# Patient Record
Sex: Male | Born: 1955 | Race: White | Hispanic: No | Marital: Single | Smoking: Never smoker
Health system: Southern US, Community
[De-identification: ages and names within clinical notes are randomized; demographics above are authoritative.]

## PROBLEM LIST (undated history)

## (undated) DIAGNOSIS — F419 Anxiety disorder, unspecified: Secondary | ICD-10-CM

## (undated) DIAGNOSIS — R251 Tremor, unspecified: Secondary | ICD-10-CM

## (undated) DIAGNOSIS — E669 Obesity, unspecified: Secondary | ICD-10-CM

## (undated) DIAGNOSIS — N3281 Overactive bladder: Secondary | ICD-10-CM

## (undated) DIAGNOSIS — M1990 Unspecified osteoarthritis, unspecified site: Secondary | ICD-10-CM

## (undated) DIAGNOSIS — F064 Anxiety disorder due to known physiological condition: Secondary | ICD-10-CM

## (undated) DIAGNOSIS — J449 Chronic obstructive pulmonary disease, unspecified: Secondary | ICD-10-CM

## (undated) DIAGNOSIS — H9190 Unspecified hearing loss, unspecified ear: Secondary | ICD-10-CM

## (undated) DIAGNOSIS — E119 Type 2 diabetes mellitus without complications: Secondary | ICD-10-CM

## (undated) DIAGNOSIS — I10 Essential (primary) hypertension: Secondary | ICD-10-CM

## (undated) DIAGNOSIS — F329 Major depressive disorder, single episode, unspecified: Secondary | ICD-10-CM

## (undated) DIAGNOSIS — F32A Depression, unspecified: Secondary | ICD-10-CM

## (undated) DIAGNOSIS — K219 Gastro-esophageal reflux disease without esophagitis: Secondary | ICD-10-CM

## (undated) DIAGNOSIS — F259 Schizoaffective disorder, unspecified: Secondary | ICD-10-CM

## (undated) DIAGNOSIS — R32 Unspecified urinary incontinence: Secondary | ICD-10-CM

## (undated) DIAGNOSIS — R002 Palpitations: Secondary | ICD-10-CM

## (undated) DIAGNOSIS — F89 Unspecified disorder of psychological development: Secondary | ICD-10-CM

## (undated) DIAGNOSIS — G4730 Sleep apnea, unspecified: Secondary | ICD-10-CM

## (undated) DIAGNOSIS — F840 Autistic disorder: Secondary | ICD-10-CM

## (undated) DIAGNOSIS — E785 Hyperlipidemia, unspecified: Secondary | ICD-10-CM

## (undated) DIAGNOSIS — F250 Schizoaffective disorder, bipolar type: Secondary | ICD-10-CM

## (undated) HISTORY — DX: Anxiety disorder, unspecified: F41.9

## (undated) HISTORY — PX: TONSILLECTOMY: SUR1361

## (undated) HISTORY — DX: Schizoaffective disorder, bipolar type: F25.0

## (undated) HISTORY — DX: Autistic disorder: F84.0

## (undated) HISTORY — DX: Schizoaffective disorder, unspecified: F25.9

## (undated) HISTORY — PX: FRACTURE SURGERY: SHX138

## (undated) HISTORY — DX: Anxiety disorder due to known physiological condition: F06.4

## (undated) SURGERY — Surgical Case
Anesthesia: *Unknown

## (undated) NOTE — *Deleted (*Deleted)
Pt is alert and oriented to person, place, time and situation. Pt is calm, cooperative, denies suicidal and homicidal ideation, denies hallucinations, denies feelings of depression, reports anxiety rates is 4/10 on a 0-10, 10 being worst. Pt reports he is ready to work with nursing, and social work.

---

## 2004-10-30 ENCOUNTER — Emergency Department: Payer: Self-pay | Admitting: Emergency Medicine

## 2005-01-28 HISTORY — PX: COLONOSCOPY: SHX174

## 2005-02-07 ENCOUNTER — Ambulatory Visit: Payer: Self-pay | Admitting: Gastroenterology

## 2006-10-22 ENCOUNTER — Emergency Department: Payer: Self-pay

## 2007-01-15 ENCOUNTER — Emergency Department: Payer: Self-pay | Admitting: Emergency Medicine

## 2007-04-16 ENCOUNTER — Ambulatory Visit: Payer: Self-pay | Admitting: Family Medicine

## 2007-05-12 ENCOUNTER — Ambulatory Visit: Payer: Self-pay | Admitting: Family Medicine

## 2007-06-11 ENCOUNTER — Ambulatory Visit: Payer: Self-pay | Admitting: Family Medicine

## 2007-07-12 ENCOUNTER — Ambulatory Visit: Payer: Self-pay | Admitting: Family Medicine

## 2007-08-12 ENCOUNTER — Ambulatory Visit: Payer: Self-pay | Admitting: Family Medicine

## 2008-03-13 ENCOUNTER — Inpatient Hospital Stay: Payer: Self-pay | Admitting: Psychiatry

## 2008-04-02 ENCOUNTER — Emergency Department: Payer: Self-pay | Admitting: Emergency Medicine

## 2008-04-18 ENCOUNTER — Emergency Department: Payer: Self-pay | Admitting: Emergency Medicine

## 2008-09-30 ENCOUNTER — Emergency Department: Payer: Self-pay | Admitting: Internal Medicine

## 2008-10-22 ENCOUNTER — Ambulatory Visit: Payer: Self-pay | Admitting: General Practice

## 2009-08-26 ENCOUNTER — Ambulatory Visit: Payer: Self-pay | Admitting: Specialist

## 2009-09-02 ENCOUNTER — Ambulatory Visit: Payer: Self-pay | Admitting: Internal Medicine

## 2009-09-09 ENCOUNTER — Ambulatory Visit: Payer: Self-pay | Admitting: Internal Medicine

## 2009-09-16 ENCOUNTER — Ambulatory Visit: Payer: Self-pay | Admitting: Internal Medicine

## 2009-09-17 ENCOUNTER — Ambulatory Visit: Payer: Self-pay | Admitting: Internal Medicine

## 2009-09-23 ENCOUNTER — Ambulatory Visit: Payer: Self-pay | Admitting: Internal Medicine

## 2009-10-02 ENCOUNTER — Ambulatory Visit: Payer: Self-pay | Admitting: Internal Medicine

## 2009-10-08 ENCOUNTER — Ambulatory Visit: Payer: Self-pay | Admitting: Internal Medicine

## 2009-10-15 ENCOUNTER — Ambulatory Visit: Payer: Self-pay | Admitting: Internal Medicine

## 2009-10-22 ENCOUNTER — Ambulatory Visit: Payer: Self-pay | Admitting: Internal Medicine

## 2009-10-26 ENCOUNTER — Emergency Department: Payer: Self-pay | Admitting: Emergency Medicine

## 2009-10-29 ENCOUNTER — Ambulatory Visit: Payer: Self-pay | Admitting: Internal Medicine

## 2009-11-05 ENCOUNTER — Ambulatory Visit: Payer: Self-pay | Admitting: Internal Medicine

## 2009-12-09 ENCOUNTER — Emergency Department: Payer: Self-pay | Admitting: Emergency Medicine

## 2010-01-19 ENCOUNTER — Inpatient Hospital Stay: Payer: Self-pay | Admitting: Psychiatry

## 2010-08-26 ENCOUNTER — Inpatient Hospital Stay: Payer: Self-pay | Admitting: Psychiatry

## 2010-09-30 ENCOUNTER — Ambulatory Visit: Payer: Self-pay | Admitting: Gastroenterology

## 2011-03-06 ENCOUNTER — Inpatient Hospital Stay: Payer: Self-pay | Admitting: Psychiatry

## 2011-04-15 ENCOUNTER — Inpatient Hospital Stay: Payer: Self-pay | Admitting: Psychiatry

## 2011-08-28 ENCOUNTER — Inpatient Hospital Stay: Payer: Self-pay | Admitting: Psychiatry

## 2011-08-28 LAB — CBC
HCT: 40.4 % (ref 40.0–52.0)
MCH: 31.8 pg (ref 26.0–34.0)
MCHC: 34 g/dL (ref 32.0–36.0)
MCV: 94 fL (ref 80–100)
RBC: 4.32 10*6/uL — ABNORMAL LOW (ref 4.40–5.90)
RDW: 13.1 % (ref 11.5–14.5)

## 2011-08-28 LAB — DRUG SCREEN, URINE
Amphetamines, Ur Screen: NEGATIVE (ref ?–1000)
Benzodiazepine, Ur Scrn: NEGATIVE (ref ?–200)
Cannabinoid 50 Ng, Ur ~~LOC~~: NEGATIVE (ref ?–50)
MDMA (Ecstasy)Ur Screen: NEGATIVE (ref ?–500)
Methadone, Ur Screen: NEGATIVE (ref ?–300)
Phencyclidine (PCP) Ur S: NEGATIVE (ref ?–25)
Tricyclic, Ur Screen: NEGATIVE (ref ?–1000)

## 2011-08-28 LAB — COMPREHENSIVE METABOLIC PANEL
Alkaline Phosphatase: 55 U/L (ref 50–136)
Bilirubin,Total: 0.2 mg/dL (ref 0.2–1.0)
Chloride: 101 mmol/L (ref 98–107)
Co2: 28 mmol/L (ref 21–32)
Creatinine: 0.89 mg/dL (ref 0.60–1.30)
EGFR (African American): >60
EGFR (Non-African Amer.): >60
Glucose: 70 mg/dL (ref 65–99)
Osmolality: 280 (ref 275–301)
Sodium: 140 mmol/L (ref 136–145)
Total Protein: 6.8 g/dL (ref 6.4–8.2)

## 2011-08-28 LAB — TSH: Thyroid Stimulating Horm: 0.95 u[IU]/mL

## 2011-08-28 LAB — ETHANOL: Ethanol: <3 mg/dL

## 2011-08-28 LAB — ACETAMINOPHEN LEVEL: Acetaminophen: <2 ug/mL

## 2011-09-03 LAB — VALPROIC ACID LEVEL: Valproic Acid: 87 ug/mL

## 2012-11-02 ENCOUNTER — Emergency Department: Payer: Self-pay | Admitting: Emergency Medicine

## 2012-12-05 ENCOUNTER — Inpatient Hospital Stay: Payer: Self-pay | Admitting: Psychiatry

## 2012-12-05 LAB — DRUG SCREEN, URINE
Cannabinoid 50 Ng, Ur ~~LOC~~: NEGATIVE (ref ?–50)
Cocaine Metabolite,Ur ~~LOC~~: NEGATIVE (ref ?–300)
Methadone, Ur Screen: NEGATIVE (ref ?–300)
Opiate, Ur Screen: NEGATIVE (ref ?–300)
Phencyclidine (PCP) Ur S: NEGATIVE (ref ?–25)
Tricyclic, Ur Screen: NEGATIVE (ref ?–1000)

## 2012-12-05 LAB — ETHANOL: Ethanol: <3 mg/dL

## 2012-12-05 LAB — COMPREHENSIVE METABOLIC PANEL
Albumin: 3.3 g/dL — ABNORMAL LOW (ref 3.4–5.0)
Alkaline Phosphatase: 54 U/L (ref 50–136)
Anion Gap: 6 — ABNORMAL LOW (ref 7–16)
BUN: 23 mg/dL — ABNORMAL HIGH (ref 7–18)
Bilirubin,Total: 0.3 mg/dL (ref 0.2–1.0)
Co2: 27 mmol/L (ref 21–32)
Creatinine: 0.9 mg/dL (ref 0.60–1.30)
EGFR (African American): >60
EGFR (Non-African Amer.): >60
Glucose: 119 mg/dL — ABNORMAL HIGH (ref 65–99)
Potassium: 4.1 mmol/L (ref 3.5–5.1)
Sodium: 137 mmol/L (ref 136–145)

## 2012-12-05 LAB — CBC
HCT: 38.6 % — ABNORMAL LOW (ref 40.0–52.0)
HGB: 13.3 g/dL (ref 13.0–18.0)
MCHC: 34.6 g/dL (ref 32.0–36.0)
MCV: 95 fL (ref 80–100)
Platelet: 134 10*3/uL — ABNORMAL LOW (ref 150–440)
RDW: 12.9 % (ref 11.5–14.5)

## 2012-12-05 LAB — URINALYSIS, COMPLETE
Bacteria: NONE SEEN
Blood: NEGATIVE
Glucose,UR: NEGATIVE mg/dL (ref 0–75)
Ketone: NEGATIVE
Leukocyte Esterase: NEGATIVE
Ph: 5 (ref 4.5–8.0)
Squamous Epithelial: NONE SEEN

## 2012-12-16 LAB — VALPROIC ACID LEVEL: Valproic Acid: 80 ug/mL

## 2013-01-09 ENCOUNTER — Emergency Department: Payer: Self-pay

## 2013-01-09 LAB — DRUG SCREEN, URINE
Barbiturates, Ur Screen: NEGATIVE (ref ?–200)
Cannabinoid 50 Ng, Ur ~~LOC~~: NEGATIVE (ref ?–50)
Methadone, Ur Screen: NEGATIVE (ref ?–300)
Opiate, Ur Screen: NEGATIVE (ref ?–300)
Phencyclidine (PCP) Ur S: NEGATIVE (ref ?–25)

## 2013-01-09 LAB — URINALYSIS, COMPLETE
Bacteria: NONE SEEN
Bilirubin,UR: NEGATIVE
Blood: NEGATIVE
Ketone: NEGATIVE
Ph: 5 (ref 4.5–8.0)
Protein: NEGATIVE
RBC,UR: <1 /HPF (ref 0–5)
Specific Gravity: 1.021 (ref 1.003–1.030)
Squamous Epithelial: <1
WBC UR: 1 /HPF (ref 0–5)

## 2013-01-09 LAB — COMPREHENSIVE METABOLIC PANEL WITH GFR
Albumin: 3.3 g/dL — ABNORMAL LOW (ref 3.4–5.0)
Alkaline Phosphatase: 78 U/L (ref 50–136)
Anion Gap: 4 — ABNORMAL LOW (ref 7–16)
BUN: 24 mg/dL — ABNORMAL HIGH (ref 7–18)
Bilirubin,Total: 0.3 mg/dL (ref 0.2–1.0)
Calcium, Total: 8.9 mg/dL (ref 8.5–10.1)
Chloride: 107 mmol/L (ref 98–107)
Co2: 30 mmol/L (ref 21–32)
Creatinine: 1.04 mg/dL (ref 0.60–1.30)
EGFR (African American): >60
EGFR (Non-African Amer.): >60
Glucose: 96 mg/dL (ref 65–99)
Osmolality: 285 (ref 275–301)
Potassium: 4.5 mmol/L (ref 3.5–5.1)
SGOT(AST): 21 U/L (ref 15–37)
SGPT (ALT): 12 U/L (ref 12–78)
Sodium: 141 mmol/L (ref 136–145)
Total Protein: 7 g/dL (ref 6.4–8.2)

## 2013-01-09 LAB — CBC
HCT: 39.5 % — ABNORMAL LOW (ref 40.0–52.0)
HGB: 13.6 g/dL (ref 13.0–18.0)
MCH: 32.9 pg (ref 26.0–34.0)
MCV: 95 fL (ref 80–100)
Platelet: 136 10*3/uL — ABNORMAL LOW (ref 150–440)
RDW: 12.9 % (ref 11.5–14.5)
WBC: 5.2 10*3/uL (ref 3.8–10.6)

## 2013-01-09 LAB — ETHANOL: Ethanol: <3 mg/dL

## 2013-01-10 LAB — VALPROIC ACID LEVEL: Valproic Acid: 74 ug/mL

## 2014-09-05 ENCOUNTER — Inpatient Hospital Stay: Payer: Self-pay | Admitting: Psychiatry

## 2014-10-31 NOTE — Consult Note (Signed)
PATIENT NAME:  Alexander Beard, Alexander Beard MR#:  [id number] DATE OF BIRTH:  24-Jun-1956  DATE OF CONSULTATION:  01/10/2013  REFERRING PHYSICIAN:   CONSULTING PHYSICIAN:  Gonzella Lex, MD  IDENTIFYING INFORMATION AND REASON FOR CONSULT: A 59 year old man with a history of schizoaffective disorder and developmental disability, who was brought to the Emergency Room under IVC.   CHIEF COMPLAINT: " I Guess I'm okay."   HISTORY OF PRESENT ILLNESS: Information obtained from speaking with the patient and also with the discharge coordinator Ailene Rud who has spoken with the patient's brother. The patient was brought under involuntary commitment after he was seen by his community support team. They were having a routine meeting to discuss his care plan with him, the topic of his ability to walk unassisted, around town came up. Eddie lost his temper, as this is a very sensitive subject for him and he was petitioned over here to the hospital. Other than that, his mood recently has been at its baseline. He has chronically unstable irritability with something of an immature reactivity to it, but he has not been more depressed than usual. No evidence that he has been having psychotic symptoms or disordered thinking more than usual. On interview today, the patient states that he is not having any thoughts about harming himself. No thoughts at all about harming his brother. He states that he is looking forward to seeing the fireworks tomorrow and enjoying the holiday weekend. He is lucid and not paranoid not making any bizarre statements. He is appropriately remorseful for his actions, but repeats to me that, as we have discussed before, this is a very sensitive topic for him and can get him upset. He has not had any new change as far as I can tell in his medicine. He says he feels like his current medication profile is working pretty well. He of course, has not been abusing any substances.   PAST PSYCHIATRIC HISTORY: The patient  has a long-standing history of a diagnosis of schizoaffective disorder, although in my work with him,  I think that probably a developmental disability is a more apt description. In any case, he has had multiple hospitalizations with a history at times of aggressive behavior only fairly mildly and under emotional distress. Mostly, he suffers from difficulty controlling his emotions and an immature approach to planning in his life. He has benefited from multiple medications that he is currently taking. The patient has had a recent hospitalization when he had voiced suicidal ideation and had his group home and also had been behaving inappropriately. That problem seems to have improved since then.   SOCIAL HISTORY: The patient's brother is his guardian. His brother, [name], is extremely involved in Elia's life and goes to a great deal of effort to take care of him. The two of them are actually close, although Kennard has a hard time showing and at times because of his immaturity. Jerrye Beavers has never been married, has no children. He currently resides in a group home. He has been in several group homes in the area and has a tendency to get into conflict with people because of his difficulty following rules.   PAST MEDICAL HISTORY: He has diabetes, which has been under reasonably good control recently. He is moderately at least overweight. Also has a history of hypertension.   SUBSTANCE ABUSE HISTORY: None.   REVIEW OF SYSTEMS: The patient tells a right now he is actually feeling pretty good. Mood feels stable. Not depressed. Not angry.  Denies suicidal ideation. Denies homicidal ideation. Physically he says he is feeling alert and awake and that his body is feeling pretty good these days.   MENTAL STATUS EXAMINATION: Slightly disheveled middle-aged gentleman who looks his stated age, interviewed in the Emergency Room. He was awake, alert and cooperative. He made good eye contact. Psychomotor activity normal. Speech  normal overall in rate and tone. Affect is somewhat constricted and a little excitable, but that is normal for him. Mood is stated as being okay. Thoughts appear to be lucid for the most part, no delusions or loosening of associations. He is easily sidetracked onto issues that more important for him, but that is not unusual. No hallucinations no suicidal or homicidal ideation. Current judgment and insight adequate and stable. Alert and oriented x 4.   ASSESSMENT: A 59 year old man with chronic problems with mood instability. A sensitive topic was brought up and he lost his temper. This is pretty typical for him. This happened the day before yesterday and now he has calmed down a great deal and is at his baseline in terms of mental state. He is not currently psychotic, not manic, not having a major depression. Denies suicidal or homicidal ideation. He is cooperative with treatment and has multiple positive things in his life he is looking forward to. There is no indication for hospitalization.   TREATMENT PLAN: Case discussed with the Emergency Room attending and the discharge coordinator. Commitment discontinued. He will be released from the Emergency Room. He follows up with me in the community and also has care management. I would not recommend making any changes to his medication from what he was on prior to coming to the hospital.   DIAGNOSIS, PRINCIPAL AND PRIMARY:   AXIS I: Schizoaffective disorder, bipolar type.   SECONDARY DIAGNOSES: AXIS I: No diagnosis.   AXIS II: Developmental disability, not otherwise specified, with autistic type symptoms.   AXIS III: Diabetes, hypertension, obesity.   AXIS IV: Chronically severe stress from his difficulty adjusting to his situation.   AXIS V: Functioning at time of evaluation 59.    ____________________________ Gonzella Lex, MD jtc:cc D: 01/10/2013 15:27:01 ET T: 01/10/2013 16:04:38 ET JOB#: [id number]  cc: Gonzella Lex, MD,  <Dictator> Gonzella Lex MD ELECTRONICALLY SIGNED 01/13/2013 16:29

## 2014-10-31 NOTE — Consult Note (Signed)
Brief Consult Note: Diagnosis: schizoaffective disorder.   Patient was seen by consultant.   Consult note dictated.   Discussed with Attending MD.   Comments: Psychiatry: PAtient seen. Well known to me. Also discussed with discharge coordinator who spoke with pts guardian/brother. Patient is at mental baseline and denies any suicidal ideation. Just lost his temper earlier and now has calmed down. Will dc IVC and he can go home with brother.  Electronic Signatures: Gonzella Lex (MD)  (Signed 03-Jul-14 15:17)  Authored: Brief Consult Note   Last Updated: 03-Jul-14 15:17 by Gonzella Lex (MD)

## 2014-10-31 NOTE — H&P (Signed)
PATIENT NAME:  Alexander Beard, Alexander Beard MR#:  [id number] DATE OF BIRTH:  [birth year]  DATE OF ADMISSION:  12/05/2012  IDENTIFYING INFORMATION AND CHIEF COMPLAINT: A 59 year old man with a history of schizoaffective disorder and developmental disorder, probably autistic type, who was brought in from his group home. The patient's chief complaint "it's just getting too much."   HISTORY OF PRESENT ILLNESS: The patient was brought in by his group home today after talking to me on the phone. They had reported that they have been having increasingly large amounts of trouble with him. He has been disruptive, argumentative, getting in trouble, having breakdowns, acting more depressed and started talking about suicide. The patient tells me that he has felt very overwhelmed recently by demands that he follow rules at school and at the group home. His mood has been more depressed. He has been feeling more anxious. He is more run down. He is more panicky. He has been having some suicidal thoughts without any specific plan or intent. He does not report hallucinations. Major stress that he reports is that he got into some kind of disagreement that somehow escalated at school at [employer]. When he spoke to the people at the group home about this, he claims that they started speaking harshly to him. He then got even more upset and started to decompensate. He jumped out of a car and ran away from the group home workers. This was disruptive enough that they feel he needs to be evaluated in a different environment.   PAST PSYCHIATRIC HISTORY: Long history of mental health problems, essentially lifelong. He has been institutionalized for a long time. He has been in multiple group homes. His diagnosis is schizoaffective disorder, but in the time I have known him these last couple of years and in the time documented in the chart, psychotic symptoms are really not as prominent as are mood problems resulting from his inability  to conform to appropriate norms or get along in groups with other individuals well. He is on multiple medications. The patient claims that they have helped him a great deal. It is not clear which specific medications have been the most benefit. I have spoken with his brother, who acts as his guardian, and the brother agrees with me that long-term the picture looks a little bit more like autism than schizophrenia. The patient does have a history of putting himself in dangerous situations but not actual suicide attempts.   SUBSTANCE ABUSE HISTORY: Does not drink or abuse drugs. No history of substance abuse.   MEDICAL HISTORY: The patient has diabetes. This has been a significant part of his longer term problem, that he has trouble sticking to his diet and managing his diabetes, with the result that he has had crises with it in the past when he cannot control his diet. Also has hypertension, vitamin D deficiency, prostate hypertrophy.   SOCIAL HISTORY: Never married. No children. His brother is very attentive to him and I am not sure if he is his legal guardian but he certainly acts in that capacity much of the time. The patient is residing currently in a group home. He has been through several group homes and tends to exasperate the people he is living with with his behavior.   REVIEW OF SYSTEMS: The patient complains of feeling anxious and depressed. Passive suicidal thoughts. Denies hallucinations or delusions. Feeling tired and run down. No specific physical complaints.   MENTAL STATUS EXAM: Somewhat disheveled as he usually looks  gentleman who looks his stated age. Interactive appropriately with me. Good eye contact. Normal psychomotor activity. Affect is demonstrative, depressed, anxious, somewhat whiny. He gets worked up pretty easily and then has to be talked down. He panics quite easily. His mood is stated as being nervous. His thoughts do not show bizarre or delusional thinking. He tends to be  somewhat simple and concrete in his thinking. No evidence of delusions or hallucinations. No suicidal intent but passive suicidal thoughts. No homicidal ideation. Alert and oriented x4. Judgment and insight chronically impaired.   PHYSICAL EXAM:  GENERAL: The patient weighs 217 pounds. Looks a little tired and run down.  SKIN: No acute skin lesions.  HEENT: Pupils equal and reactive. Face symmetric.  NECK AND BACK: Nontender.   NEUROLOGICAL: Full range of motion at extremities. Normal gait. Strength and reflexes normal throughout. Cranial nerves symmetric and normal.  LUNGS: Clear without wheezes.  HEART: Regular rate and rhythm.  ABDOMEN: Normal bowel sounds. Soft, nontender.  VITAL SIGNS: Temperature 98.7, pulse 48, respirations 18, blood pressure 130/76.   CURRENT MEDICATIONS: Vitamin D3 5000 units per day, Depakote 2000 mg at night, Lasix 20 mg per day, gabapentin 100 mg 3 times a day, Lamictal 100 mg at night, Ativan 1 mg 3 times a day, Latuda 80 mg with breakfast, metoprolol XL 50 mg per day, Seroquel 100 mg 3 times a day and 300 mg at night, Zoloft 200 mg per day, simvastatin 20 mg at night, Vesicare 5 mg a day, Flomax 0.4 mg in the morning, insulin Lantus 20 units at bedtime, Januvia 100 mg per day.   ALLERGIES: CORTISONE.   LABORATORY RESULTS: Blood glucose slightly elevated at 137. Full chemistry panel shows drug screen negative. Valproic acid 77. Alcohol undetected. TSH normal at 0.8. Otherwise fairly normal chemistries. CBC: Hematocrit low at 38.6, platelets low at 134. Urinalysis unremarkable, not even showing positive glucose.   ASSESSMENT: A 59 year old man with chronic mental health problems and behavior problems. Has decompensated with worsening symptoms of depression and behavior problems at his group home requiring inpatient hospitalization.   TREATMENT PLAN: Admit to psychiatry. Continue current medicines. We will have to work with his group home and his brother on trying to  clarify whether this is something that needs medication changes or if it will clear up with some time away and more communication. He will engage in groups on the unit.   DIAGNOSIS, PRINCIPAL AND PRIMARY:  AXIS I: Schizoaffective disorder.   SECONDARY DIAGNOSES:  AXIS I: No further.  AXIS II: Developmental disorder, not otherwise specified, probably autism.  AXIS III: Diabetes, overweight, hypertension.  AXIS IV: Severe from the stress of being in a group home and not getting what he wants.  AXIS V: Functioning at time of evaluation 35.    ____________________________ Gonzella Lex, MD jtc:gb D: 12/06/2012 01:09:28 ET T: 12/06/2012 01:28:04 ET JOB#: [id number]  cc: Gonzella Lex, MD, <Dictator> Gonzella Lex MD ELECTRONICALLY SIGNED 12/06/2012 11:04

## 2014-10-31 NOTE — Consult Note (Signed)
PATIENT NAME:  Alexander Beard, Alexander Beard MR#:  [id number] DATE OF BIRTH:  [birth year]  DATE OF CONSULTATION:  01/10/2013  REFERRING PHYSICIAN:  Desiree Lucy. Jasmine December, MD CONSULTING PHYSICIAN:  Cordelia Pen. Gretel Acre, MD  REASON FOR CONSULTATION: I got into an argument with the brother, and it made me feel suicidal, and I am here.   HISTORY OF PRESENT ILLNESS: The patient is a 59 year old male with history of schizoaffective disorder, who was petitioned by [facility] after the patient became suicidal and stated that he cannot take it anymore. He has history of schizoaffective disorder and has been following with [name] as an outpatient at [facility] . He was recently discharged from the [facility]. The patient reported that [name] has changed my medication, and he became upset with his brother and got into a fight with him. He reported that there was a therapist who saw him, and they sent me here.   During my interview, the patient was lying in the bed and was noted to be shaking and trembling. He reported that he has several problems, and he was not allowed to go out of his group home. He reported that he is making progress in his school, and his life is not cooperating with him. He feels incomplete. He stated that he is not allowed to walk all over [place] as he used to do in the past. He reported that he collected over $425 for [employer]. He reported that he was honored several times in the past, including with African-American people. He stated that they will not let me go out. He stated that he has fallen several times in the past because of his medications. He stated that because of the safety reasons, the group home staff keep him inside the house. They have argued several times that he should stay. He reported that he has to take the risk because he feels incomplete without going out. It was noted that the patient's hands were shaking visibly during the interview. He  stated that his mind is also racing, and he worries too much. He thinks about taking his life sometimes. He reported that he has never tried, but has been feeling suicidal. He reported that he does not have any current plans.   PAST PSYCHIATRIC HISTORY: The patient has long history of mental illness and has been diagnosed with schizoaffective disorder. He has been in multiple group homes. He was recently discharged from the inpatient psychiatric unit and has been tried on multiple psychotropic medications. It was noted that he is on a combination of multiple medications including Latuda, Seroquel, Depakote, lamotrigine and Zoloft, and they are on higher doses. Collateral information was also obtained from his brother, who acts as his guardian, and his brother also agreed that the patient is not doing well since his medication has been adjusted recently from Dr. Gala Murdoch and [name]' office. It was reported that the patient has a history of putting himself in dangerous situations, but has never attempted suicide.   SUBSTANCE ABUSE HISTORY: The patient does not drink or use drugs. No history of suicide attempts known.   MEDICAL HISTORY:  1. The patient has a history of diabetes. He manages his diabetes.  2. Hypertension.  3. Vitamin D deficiency.  4. Prostate hypertrophy.   SOCIAL HISTORY: The patient has never been married and does not have any children. He is very close to his brother. He currently lives in the group home.   REVIEW OF  SYSTEMS:  CONSTITUTIONAL: No fever or chills. Significant shaking noted. No weight changes.  EYES: No double or blurred vision.  RESPIRATORY: No shortness of breath or cough.  CARDIOVASCULAR: No chest pain or orthopnea.  GASTROINTESTINAL: No abdominal pain, nausea, vomiting, diarrhea.  GENITOURINARY: No incontinence or frequency.  ENDOCRINE: No heat or cold intolerance.  LYMPHATIC: No anemia or easy bruising.  INTEGUMENTARY: No acne or rash.   MUSCULOSKELETAL: No muscle or joint pain.   MENTAL STATUS EXAMINATION: The patient is somewhat disheveled and was noted to be shaking. He maintained fair eye contact. His speech was low in tone and volume. He appeared anxious and depressed. He admits to having suicidal intent with no active plans. He denied having any homicidal ideations.   ANCILLARY DATA: Temperature 98.4, pulse 68, respirations 18, blood pressure 118/77. Glucose 114, BUN 24, creatinine 1.04, sodium 141, potassium 4.5, chloride 107, bicarbonate 30, anion gap 4, osmolality 285, calcium 8.9. Blood alcohol level less than 3. Protein 7.0, albumin 3.3, bilirubin 0.3, alkaline phosphatase 78, AST 21, ALT 12. TSH 1.0. Valproic acid level 74. Urine drug screen positive for benzodiazepines and tricyclic antidepressants. WBC 5.2, RBC 4.15, hemoglobin 13.6, hematocrit 39.5, platelet count 136, MCV 95, MCH 32.9, RDW 12.9.   DIAGNOSTIC IMPRESSION:  AXIS I: Schizoaffective disorder.  AXIS II: None.  AXIS III: Diabetes, overweight, hypertension.  AXIS IV: Severe at this time.   TREATMENT PLAN: The patient will be admitted to the [facility] once he is cleared from [name], who will come and review his chart again. I will adjust his medications as the patient appeared to be on high doses of psychotropic medications and is noticing to be having adverse effects from the medications. I will adjust the medications as follows:  1. Depakote ER 500 mg at bedtime.  2. Gabapentin 100 mg p.o. t.i.d.  3. Lamotrigine 200 mg p.o. q.a.m.  4. Latuda 40 mg p.o. q.a.m.  5. Quetiapine 25 mg p.o. b.i.d. and 200 mg at bedtime.  6. Zoloft 100 mg p.o. q.a.m. 7. Benztropine 0.5 mg p.o. b.i.d.   If he gets admitted to the [facility], treatment team will continue to follow him over there. If the patient does not get admitted, he will be discharged in the care of his group home.   Thank you for allowing me to  participate in the care of this patient.   ____________________________ Cordelia Pen. Gretel Acre, MD usf:OSi D: 01/10/2013 12:34:26 ET T: 01/10/2013 13:29:31 ET JOB#: [id number]  cc: Cordelia Pen. Gretel Acre, MD, <Dictator> Jeronimo Norma MD ELECTRONICALLY SIGNED 01/17/2013 9:59

## 2014-11-02 NOTE — Discharge Summary (Signed)
PATIENT NAME:  Alexander Beard, Alexander Beard MR#:  [id number] DATE OF BIRTH:  [birth year]  DATE OF ADMISSION:  08/28/2011 DATE OF DISCHARGE:  09/06/2011  HOSPITAL COURSE: See dictated History and Physical for details. This 59 year old gentleman, well known to our Service with a history of schizoaffective disorder but quite possibly also chronic autism-like disorder, was admitted through the Emergency Room because of agitation and suicidal threats. This is a typical pattern for Mr. Champlain. Periodically, he becomes agitated at some complaint he has with his group home and can easily work himself up into a little bit of a frenzy. He makes suicidal threats and is not manageable outside the hospital. Once he comes into the hospital, he usually settles down quite quickly. For the first day or so he was still a little bit irritable in conversation but easily redirected. I did not make any changes to any of his psychotropic medications. He is on a complicated regimen of several psychiatric medicines that has been arrived at over time, and on previous hospital stays it has not been necessary to make any changes in order for him to show improvement. Indeed, over the course of the hospital stay he quickly improved. He was able to participate in groups appropriately, able to do recreational activities appropriately. He was able to take care of his activities of daily living. He did not make any further suicidal threats and did not behave in a dangerous manner. As previously, his chief complaint was wanting to be at a different group home. At this point, it seems that he is going to get his wish, although it may or may not suit his desires. He needs to move from his current group home within the next few weeks because they are changing status at that facility to take care of only elderly clients. In the hospital stay, we made some attempt to try and introduce him to some other group homes. He met at least one representative from another  group home, but it did not work out for an immediate placement. He was discharged back to his current group home with the understanding that his brother, who works with him very closely, will continue to assist him in finding a new place to live. Neel had a successful pass over the weekend that he was here, going out with his brother to go bowling. This seemed to be therapeutically helpful for him.   DISCHARGE MEDICATIONS:  1. Depakote Extended Release 2000 mg at bedtime.  2. Lasix 20 mg per day.  3. Gabapentin 100 mg t.i.d.  4. Tofranil 25 mg at bedtime.  5. Lantus insulin 20 units subcutaneous at bedtime.  6. Lamictal 100 mg at bedtime.  7. Lisinopril 10 mg per day.  8. Latuda 80 mg in the morning with food. 9. Mobic 7.5 mg per day as needed for pain.  10. MiraLax 8.5 grams orally once a day.  11. Quetiapine 100 mg t.i.d. and 300 mg at night.  12. Zoloft 150 mg per day.  13. Zocor 20 mg at bedtime.  14. Januvia 100 mg per day.  15. VESIcare 5 mg per day.   LABORATORY DATA: Admission labs showed his drug screen to be negative. TSH normal. Alcohol undetectable. Chemistry panel unremarkable. CBC showed a slightly low red blood cell count and low platelet count of doubtful significance, not in the pathologic range.   MENTAL STATUS EXAM AT DISCHARGE: A slightly disheveled man who looks his stated age. Cooperative with the interview. Good eye contact. Normal  psychomotor activity. Affect a little bit constricted and flat. Mood stated as okay. Thoughts lucid for the most part, although he sometimes gets a little tangential, particularly when he gets agitated. No sign of grossly delusional thinking or responding to internal stimuli. He denied hallucinations. He denied suicidal or homicidal ideation. He has chronically somewhat impaired judgment and insight, although improved from the time of admission.   DISPOSITION: Discharged back to [facility] group home with the expectation that he  will be finding a new one soon. Continue outpatient medication management with me.   DIAGNOSIS, PRINCIPAL AND PRIMARY:  AXIS I: Schizoaffective disorder.   SECONDARY DIAGNOSES:  AXIS I: No further.   AXIS II:  Autistic disorder, not otherwise specified.   AXIS III:  1. Diabetes. 2. Hypertension. 3. Overactive bladder.  4. Osteoarthritis.   AXIS IV: Severe stress from his chronic illness.   AXIS V: Functioning at the time of discharge 60.   ____________________________ Gonzella Lex, MD jtc:cbb D: 09/09/2011 16:25:05 ET T: 09/10/2011 08:30:56 ET JOB#: [id number]  cc: Gonzella Lex, MD, <Dictator> Gonzella Lex MD ELECTRONICALLY SIGNED 09/10/2011 9:54

## 2014-11-02 NOTE — H&P (Signed)
PATIENT NAME:  Alexander Beard, Alexander Beard MR#:  [id number] DATE OF BIRTH:  1956/02/11  DATE OF ADMISSION:  08/28/2011  REFERRING PHYSICIAN: Lenise Arena, MD  ATTENDING PHYSICIAN: Orson Slick, MD  IDENTIFYING DATA: Alexander Beard is a 59 year old male with a history of mood instability.   CHIEF COMPLAINT: "I do not want to live in the country".   HISTORY OF PRESENT ILLNESS: Alexander Beard returns to [facility] Emergency Room extremely upset, screaming from the top of his lungs, unable to hold it together. He became upset because he started worrying that he will remain in a group home located in the country. Alexander Beard enjoys staying in town, especially walking to a pizza parlor to order his pizza every day that resulted in enormous weight gain and health consequences. His brother who is the guardian placed the patient in the country where he can walk all he wants but has no access to additional food. The problem of relocation to [facility] has been on the agenda for several years now. Apparently the additional stress is the fact that the group home, that the patient currently lives in, will close for patients with mental illness and we will retain only resident's of old age. Alexander Beard expects to be transferred to another group home and he is trying to put pressure on his brother and the treatment team to secure placement downtown. He has a long history of mental illness and is on an enormous list of medications. He believes that the current regimen is working and does not wish any medication adjustments. He reports severe anxiety with panic attacks and mood swings, but up until recently he was able to deal with it. He also at times has psychotic symptoms, but lately with three antipsychotics on board he has been fairly stable. There is no history of alcohol, illicit drugs, or prescription pill abuse.   PAST PSYCHIATRIC HISTORY: Alexander Beard has a long history of mental illness. He has prior  diagnoses of schizophrenia, major depressive disorder, generalized anxiety disorder, and schizoaffective disorder. He also has mild MR. He has been hospitalized in multiple hospitals, here at [facility], [facility], and [facility] HISTORY: There is an aunt with unknown mental illness.    PAST MEDICAL HISTORY:  1. Obesity.  2. Diabetes. 3. Hypertension.  4. Dyslipidemia.  5. Gastroesophageal reflux disease.   ALLERGIES: Cortizone and penicillin.  MEDICATIONS ON ADMISSION:  1. Depakote 2000 mg at night. 2. Furosemide 20 mg daily.  3. Neurontin 100 mg three times daily. 4. Imipramine 25 mg at bedtime.  5. Insulin Lantus 20 units at bedtime. 6. Lamictal 100 mg at bedtime. 7. Lisinopril 10 mg daily. 8. Ativan 1 mg every eight hours. 9. Latuda 80 mg with breakfast. 10. Mobic 7.5 mg daily as needed for pain. 11. MiraLax 8.5 grams daily.  12. Seroquel 100 mg three times daily. 13. Seroquel 300 mg at bedtime. 14. Zoloft 150 mg in the morning. 15. Zocor 20 mg at bedtime.  16. Januvia 100 mg daily. 17. VESIcare 5 mg daily.  18. CPAP machine. 19. Carbohydrate-controlled diet. 20. Nightly Accu-Cheks.   SOCIAL HISTORY: Alexander Beard is originally from [facility]. His brother is his guardian. He has been very patient and supportive. The patient has a ninth grade education. He has a GED. He worked at [employer] for a time. He is disabled now. He has never been married and has no children. He has been a resident  of group homes and now expects another transition. He tried to work with [facility] in [place] as well as independent living in the past, but the attempts were unsuccessful.   REVIEW OF SYSTEMS: CONSTITUTIONAL: No fevers or chills. Positive for welcomed weight loss. EYES: No double or blurred vision. ENT: No hearing loss. RESPIRATORY: No shortness of breath or cough. CARDIOVASCULAR: No chest pain or orthopnea.  GASTROINTESTINAL: No abdominal pain, nausea, vomiting, or diarrhea. Positive for constipation. GU: Positive for urinary incontinence. ENDOCRINE: No heat or cold intolerance. LYMPHATIC: No anemia or easy bruising. INTEGUMENTARY: No acne or rash. MUSCULOSKELETAL: Positive for muscle and joint pain. NEUROLOGIC: No tingling or weakness. PSYCHIATRIC: See history of present illness for details.   PHYSICAL EXAMINATION:   VITAL SIGNS: Blood pressure 137/82, pulse 80, respirations 18, temperature 98.   GENERAL: This is a slightly obese male who is upset and crying.   HEENT: The pupils are equal, round, and reactive to light.   NECK: Supple. No thyromegaly.   LUNGS: Clear to auscultation. No dullness to percussion.   HEART: Regular rhythm and rate. No murmurs, rubs, or gallops.   MUSCULOSKELETAL: Normal muscle strength in all extremities.   SKIN: No rashes or bruises.   LYMPHATIC: No cervical adenopathy.   NEUROLOGIC: Cranial nerves II through XII are intact.  LABS/STUDIES: Chemistries are within normal limits. Blood alcohol level is zero. LFTs are within normal limits. TSH is 0.95. Urine tox screen is negative for substances. CBC is within normal limits, except for low platelets of 119. Serum acetaminophen and salicylates are low.   MENTAL STATUS EXAMINATION ON ADMISSION: The patient is examined in the emergency room in the presence of his brother who is the guardian. The patient was initially extremely agitated to the point that Dr. Jimmye Norman threatened to ban him from coming to the emergency room. He was screaming from the top of his lungs, unable to stop. He is calmer now. He is not yelling, but he keeps crying. Alexander Beard has very poor coping skills and I understand that this is his attempt to put pressure on his brother to seek placement in a new group home downtown. The brother has been super patient and understanding. The patient is oriented to person, place, time, and situation. He is  marginally cooperative, but I was able to redirect him and towards the end of the interview he was able to hold it together. His speech is loud at times. He maintains good eye contact. His mood is terrible with labile affect. Thought processing is logical, but I was unable to perform cognitive part of the exam. He denies suicidal or homicidal ideation, but threatens to feel unsafe if returned to the group home. After a lengthy conversation, he accepts that he will not be moved to any other facility for the next 30 days as long as the group home will have him. The social worker, his caseworker and the brother are trying to find a new place for him. There are no delusions or paranoia. There are no auditory or visual hallucinations. His insight and judgment are minimal.  SUICIDE RISK ASSESSMENT: This is a patient with a long history of mood instability who lost his composure in the face of new stressors but is redirectable. He is verbal. He is compliant with medication. He hopes to further negotiate his discharge with his brother.  DIAGNOSES:   AXIS I: Schizoaffective disorder.   AXIS II: Developmental disorder, not otherwise specified.   AXIS III: Diabetes, obesity,  hypertension, gastroesophageal reflux disease, dyslipidemia.   AXIS IV: Chronic mental illness, cognitive problems, extremely poor coping skills, threat of relocation to a new facility.   AXIS V: GAF on admission 30.   PLAN: The patient was admitted to [facility] for safety, stabilization, and medication management. He was initially placed on suicide precautions and was closely monitored for any unsafe behaviors. He received pharmacotherapy, individual and group psychotherapy, substance abuse counseling, and support from therapeutic milieu.  1. Agitated behavior: This has resolved. 2. schizoaffective disorder: We will continue all his many medications without changes including two mood  stabilizers and three antipsychotics. It is unclear if the patient benefits from all of them, but he has been doing well for the past four months until this last melt down. The patient nor the guardian want to change anything.  3. Medical: We will continue his insulin and diabetes treatment with monitoring.  4. The patient has CPAP. Orders were written.  5. Disposition: He will likely return to his old group home. He will be working with his brother and the Education officer, museum on new placement. ____________________________ Wardell Honour. Bary Leriche, MD jbp:[id number] D: 08/29/2011 14:41:42 ET T: 08/29/2011 15:22:42 ET JOB#: [id number]  cc: Jolanta B. Bary Leriche, MD, <Dictator> Clovis Fredrickson MD ELECTRONICALLY SIGNED 09/03/2011 4:36

## 2014-11-09 NOTE — H&P (Signed)
PATIENT NAME:  Alexander Beard, Alexander Beard MR#:  [id number] DATE OF BIRTH:  October 29, 1955  DATE OF ADMISSION:  09/05/2014  REFERRING PHYSICIAN: Emergency Room MD.   ATTENDING PHYSICIAN: Sherron Mummert B. Bary Leriche, MD   IDENTIFYING DATA: Alexander Beard is a 59 year old male with history of schizoaffective disorder and developmental disability.   CHIEF COMPLAINT: "Nobody listens to me."   HISTORY OF PRESENT ILLNESS: Alexander Beard has been a resident of his current group home for the past 3 years. He has also been in the care of [name], stable on his medications. He was brought to the hospital on petition from his group home after he had a major meltdown at the group home. He awoke in an agitated mood, ran away from the group home, and then twice, tried to jump out of the running car. The patient has a long history of mental illness and conflict at the group home. His favorite past is walking. As he is getting older, his guardian, who is also his brother, felt that this unsafe for the patient to walk. It is especially evident in the summer, when he is exposed to heat and has gotten sick in the past. There are also safety concerns. The patient tends to eat out when he walks around [place]. His walking has been restricted. The patient believes that there is a special law that makes it illegal for him to walk. This has been introduced by the group home owner, as well as his brother. He asked 3 separate police officers if this was true and was told that it is not. He is rather confused and unhappy about his group home. He feels imprisoned there. He describes that every so often, he gets so anxious, all his feelings are bottled up. He knows that an "eruption of a volcano " is coming for several hours before it happens, and at some point, he is unable to control it. He then screams, yells, punches, and runs away from the group home. He has a long history of such explosive behavior, and no medication or strong-will can control it. He  reports being compliant with all of his medications and actually feels that they work pretty good, except for the volcano. He, at this point, still endorses suicidal ideation and threatens to kill himself if he is taken back to the same group home. At the same time, he appreciates some activities that he is able to continue while in this particular group home, singing with his singing group, doing art, writing articles and letters to [employer]. 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 He is afraid that moving to a new group home will disrupt his connections. He is rather unhappy with his guardian now, even though his brother, Alexander Beard, is taking good care of him, the patient feels that he, himself, should be making decisions and that Alexander Beard has always been negative, never gave him a chance, and would always dismiss his ideas as impractical. Reportedly, [name] makes research online and then tells Sher that this or that will not work. The patient denies any psychotic symptoms. Denies symptoms suggestive of bipolar mania. He is actually quite depressed with feeling of hopelessness, worthlessness, crying spells. He feels terribly guilty about some things that happened in the past. He reminisces on his experience in middle and high school, when he was harassed and bullied. He feels utterly unhappy now. There is no alcohol or substances involved.   PAST PSYCHIATRIC HISTORY: A long history of mental illness with  multiple medication trials, stable on current regimen. There were no suicide attempts until this jumping out of the car. When asked, the patient agrees that maybe it was his way to escape from the situation, rather than to kill himself.   FAMILY PSYCHIATRIC HISTORY: None reported.   PAST MEDICAL HISTORY: :Diabetes, hypertension, allergies, urinary incontinence, benign prostatic hypertrophy, constipation, and arthritis.   ALLERGIES: CORTISONE.   MEDICATIONS ON ADMISSION: Vitamin D 5000 units  daily, Depakote 2000 mg at bedtime, Lasix 20 mg daily, Neurontin 100 mg 3 times daily, Haldol 2 mg every 4 hours for agitation, glargine insulin 15 units at bedtime,  Lamictal 150 mg at bedtime, lisinopril 5 mg daily, Claritin 10 mg daily, Ativan 0.5 mg 3 times daily, Latuda 40 mg daily, metoprolol ER 25 mg daily, naproxen 500 mg twice daily, MiraLax 17 grams as needed, Seroquel 300 mg at night plus 50 mg 3 times a day, Zoloft 200 mg daily, Zocor 20 mg at bedtime, Januvia 100 mg a day, VESIcare 5 mg daily, Flomax 0.4 mg daily.   SOCIAL HISTORY: Mr. Topper is an incompetent adult. His brother, Alexander Beard, is his legal guardian. He has been a resident of group homes for many years. He has been in his current on for 3 years. His brother believes that this particular group home creates a stable environment for [facility]. He engages with his brother, frequently taking him out shopping and for weekends, providing money. He is disabled from mental illness and has Medicaid.   REVIEW OF SYSTEMS:  CONSTITUTIONAL: No fevers or chills. No weight changes.  EYES: No double or blurred vision.  EARS, NOSE, AND THROAT:: No hearing loss.   RESPIRATORY: No shortness of breath or cough.  CARDIOVASCULAR: No chest pain or orthopnea.  GASTROINTESTINAL: No abdominal pain, nausea, vomiting, or diarrhea.  GENITOURINARY: No incontinence or frequency.  ENDOCRINE: No heat or cold intolerance.  LYMPHATIC: No anemia or easy bruising.  INTEGUMENTARY: No acne or rash.  MUSCULOSKELETAL: No muscle or joint pain.  NEUROLOGIC: No tingling or weakness.  PSYCHIATRIC: See history of present illness for details.   PHYSICAL EXAMINATION: VITAL SIGNS: Blood pressure 133/85, pulse 56, respirations 20, temperature 99.1.  GENERAL: This is a slightly obese male, looking older than stated age, in no acute distress.   HEENT: The pupils are equal, round, and reactive to light. Sclerae anicteric.  NECK: Supple. No thyromegaly.  LUNGS: Clear to  auscultation. No dullness to percussion.  HEART: Regular rhythm and rate. No murmurs, rubs, or gallops.  ABDOMEN: Soft, nontender, nondistended. Positive bowel sounds.  MUSCULOSKELETAL: Normal muscle strength in all extremities.  SKIN: No rashes or bruises.  LYMPHATIC: No cervical adenopathy.  NEUROLOGIC: Cranial nerves II through XII are intact.   LABORATORY DATA: Chemistries are within normal limits. Blood glucose 118, BUN 22. Blood alcohol level 0. LFTs within normal limits. Depakote level is 69. Urine toxicology screen negative for substances. CBC within normal limits. Urinalysis is not suggestive of urinary tract infection. Serum acetaminophen and salicylates are low.   MENTAL STATUS EXAMINATION ON ADMISSION: The patient is alert and oriented to person, place, time and situation. He is pleasant, polite, and cooperative. He recognizes me from previous admissions. He maintains intense eye contact. His speech is of normal rhythm, rate, and volume. He is quite tearful on the interview. Mood is depressed with crying affect. Thought process is logical with its own logic. He still feels suicidal, especially in the case of returning to his current living arrangements. There are no  thoughts of hurting others. There are no delusions. He may be a little paranoid about his brother. No auditory or visual hallucinations. His cognition is grossly intact, but difficult to formally test today due to the patient being upset. He is probably of below-average intelligence and fund of knowledge. His insight and judgment are limited.   SUICIDE RISK ASSESSMENT ON ADMISSION: This is a patient with a long history of depression, anxiety, mood instability, psychosis, and poor coping skills, who came to the hospital suicidal, attempting to jump out of the car in the context of chronic stress at the group home.   INITIAL DIAGNOSES:  AXIS I: Schizoaffective disorder, bipolar type.  AXIS II: Developmental disability.  AXIS  III: Diabetes, hypertension, dyslipidemia, benign prostate hypertrophy, urinary incontinence, arthritis, vitamin D deficiency.   PLAN: The patient was admitted to [facility] unit for safety, stabilization, and medication management.  1.  Suicidal ideation: The patient is able to contract for safety.  2.  Mood and psychosis. We will continue all medications as prescribed by [name]. Lamictal, Depakote, and Seroquel for mood stabilization ,Latuda and Zoloft for depression.  3.  Anxiety. We will continue low-dose Ativan. 4.   Hypertension. We will continue lisinopril, metoprolol, and furosemide.  5.  Diabetes. We will continue Lantus, Januvia, blood glucose monitoring, and diabetic diet. 6.  Dyslipidemia. We will continue simvastatin.  7.  Urinary tract problems. We will continue Flomax for benign prostatic hypertrophy and VESIcare for overactive bladder. 8.  Constipation. I will continue MiraLax.  9.  Arthritis. We will continue Celebrex.  10.  Social. The patient is an incompetent adult. His brother is his guardian,  9. Disposition. He will be returning back to his group home. Follow up with [name].      ____________________________ Wardell Honour Bary Leriche, MD jbp:mw D: 09/05/2014 15:53:51 ET T: 09/05/2014 17:00:29 ET JOB#: [id number]  cc: Mckenna Gamm B. Bary Leriche, MD, <Dictator> Clovis Fredrickson MD ELECTRONICALLY SIGNED 09/22/2014 7:23

## 2014-11-09 NOTE — Consult Note (Signed)
PATIENT NAME:  Alexander Beard MR#:  [id number] DATE OF BIRTH:  24-Sep-1955  DATE OF CONSULTATION:  09/04/2014  REFERRING PHYSICIAN:   CONSULTING PHYSICIAN:  Alexander Lex, MD  IDENTIFYING INFORMATION AND REASON FOR CONSULTATION:  A 60 year old male with a history of schizoaffective disorder who was brought in under involuntary commitment after running away from his group home and trying to jump out of a moving car.   CHIEF COMPLAINT: "There is too much stuff in my mind."   HISTORY OF PRESENT ILLNESS: Information obtained from the patient, from the chart, as well as from my long-standing working with this patient and conversation with his current group Games developer.  The group home manager reports that this morning Alexander Beard got up and was already more agitated than usual.  He was upset and angry about the same things that he is always upset about, mainly his belief that he should be allowed to walk freely around town and do many more activities than he is currently doing.  Apparently, this escalated, although he did not get violent.  They went for a ride in the car because some of the other patients had to go to appointments.  At 1 point, Alexander Beard tried to jump out of the moving vehicle. When the vehicle came to a stop, he did jump about and ran away.  The group home manager was able to talk him back into the [facility] and take him back to the group home where she tried to calm him down. This was a temporary solution and he escalated again and ran away from the group home. The police then had to be called to retrieve him.  There is no known precipitant to this. No known change in his social situation or new emotionally upsetting events. No known anniversary event.  No change in any of his medications.  No evidence that he has been medically sick.  In interview today, Alexander Beard tells me that he feels like all of the things that he worries about have just come up like a volcano and overwhelmed him and that he has to  go on about it today and get things fixed or he will die.  He made comments earlier today about how he was going to die or kill himself if he could not get relief from the things he is upset about.  He goes on and on to me about the same issues we have talked about many times before.  There is nothing new in any of his complaints.  He is not talking about any hallucinations.  He does talk about how he thinks he is going to die if he does not get relief from them.  Does not think any threatening statements.   PAST PSYCHIATRIC HISTORY: Alexander Beard has a long lifetime history of mental illness. He has been diagnosed as schizoaffective disorder in the past.  I do think that there is a bipolar component to his illness as is evident today, but I also think that he has a core of developmental disability, which has prevented him from getting much better.  He is on quite a few medications which seemed to add some benefit to his stability. There is no history of actual suicide attempts. There is no history of actual malicious violence to anyone else. He has not run away from places and put himself in dangerous situations before.  In general he has been cooperative with outpatient treatment, although sometimes he is a little excessively emotional  and he has ended up getting himself discharged from several group homes as well as several providing agencies with his behavior over the years.   SUBSTANCE ABUSE HISTORY: No alcohol. No drugs. No history of substance abuse.   SOCIAL HISTORY: Alexander Beard' closest relative, in fact, the only relative I know of, is his brother Alexander Beard.  Alexander Beard is Alexander Beard's legal guardian.  He is extremely involved in Alexander Beard's care.  He still visits with him and takes him out of the group home every week.  Alexander Beard has an understanding that Alexander Beard greatest advocate, but at the same time, he will get angry at him because he is aware that Alexander Beard limits some of the Alexander Beard's activities.   FAMILY HISTORY: No known  family history.   PAST MEDICAL HISTORY: The patient has diabetes for which he uses insulin as well as oral medication. High blood pressure. A history of chronic constipation. History of prostate hypertrophy with urinary retention. Some degree of peripheral neuropathy from his diabetes. He has had some problems at times with excessive falls and gait problems related to this.   CURRENT MEDICATIONS: Vitamin-D 5000 units a day, Depakote 2000 mg at night, Lasix 20 mg per day, gabapentin 100 mg 3 times a day, Haldol 2 mg every 4 hours as needed for agitation, glargine insulin 15 units subcutaneous at night, Lamictal 150 mg at night, lisinopril 5 mg per day, Claritin 10 mg a day, Ativan 0.5 mg 3 times a day, Latuda 40 mg per day, metoprolol extended release 25 mg a day. He has a steroid cream that he uses p.r.n. when he gets rashes, Naprosyn 500 mg twice a day as needed for moderate pain such as arthritis or his neuropathy, MiraLax 17 grams a day as needed, Seroquel 300 mg at night +50 mg 3 times a day, Zoloft 200 mg per day, Zocor 20 mg at night, Januvia 100 mg per day, nasal saline spray as needed for nasal dryness.  VESIcare 5 mg a day, Flomax 0.4 mg per day.   ALLERGIES: CORTISONE.   REVIEW OF SYSTEMS: Alexander Beard says that he feels like he has too many thoughts building up pressure in his head. He is agitated.  Feels like if he does not get relief from things. He is going to die. He denies any physical symptoms.   MENTAL STATUS EXAMINATION: Disheveled gentleman who looks his stated age or older. He is hard to interview because of his insistence on dominating the conversation. Psychomotor activity is very animated with lots of waving arms and pacing at times. Speech is loud and pressured much of the time. Affect is labile with periods of sobbing and crying as well as angry outbursts.  Mood is stated as being at his wits end. Thoughts are disorganized, circular, repetitive on his usual complaints. No obvious  delusions. Denies hallucinations. He has had some suicidal statements, but does not have any specific intent or plan. No homicidal ideation. He is alert and oriented x 4. Can repeat three words immediately, remembers only 1 of them at 3 minutes.  His judgment and insight is chronically poor. Intelligence is at his baseline.   LABORATORY RESULTS: Drug screen is all negative. Urinalysis unremarkable including fortunately not having any glucose in it.  His CBC shows a low platelet count of 121,000. Chemistry panel: Elevated glucose 118, BUN elevated at 22, his creatinine was normal, everything else is unremarkable. Acetaminophen alcohol and salicylates all negative.   VITAL SIGNS:  Blood pressure 139/87, respirations 18, pulse 89,  temperature 98.2.   ASSESSMENT: A 59 year old man with schizoaffective disorder, as well as chronic developmental disability.  Today for some reason, he has escalated his behavior with agitation,  emotional lability and out of control dangerous conduct, which has not gotten under control with appropriate verbal or medical interventions. I think he is having some kind of brief micro-manic episode, hopefully that can be brought under control quickly, but he needs hospitalization for safety.   TREATMENT PLAN: Admit to psychiatry. Elopement, suicide, fall precautions in place. I am going to check his Depakote.  Continue his medicines.  I am well aware that he is on a very large number of medicines with multiple antipsychotics and over-lapse of his medicine.  I have tried decreasing things in the past as an outpatient without much success.  He tends to decompensate whatever we have made much of a change.  I will continue his current medicines for now.   DIAGNOSIS, PRINCIPAL AND PRIMARY:  AXIS I: Schizoaffective disorder, bipolar type, manic.   SECONDARY DIAGNOSIS:  AXIS I: No further.  AXIS II: Developmental disability, not otherwise specified.  AXIS III: Diabetes, high blood  pressure, prostate hypertrophy, chronic constipation.     ____________________________ Alexander Lex, MD jtc:DT D: 09/04/2014 17:21:51 ET T: 09/04/2014 17:55:36 ET JOB#: [id number]  cc: Alexander Lex, MD, <Dictator> Alexander Lex MD ELECTRONICALLY SIGNED 09/16/2014 10:37

## 2014-12-23 ENCOUNTER — Other Ambulatory Visit: Payer: Self-pay

## 2014-12-23 DIAGNOSIS — F840 Autistic disorder: Secondary | ICD-10-CM

## 2014-12-23 MED ORDER — QUETIAPINE FUMARATE 50 MG PO TABS: 50.0000 mg | ORAL_TABLET | Freq: Three times a day (TID) | ORAL | Status: DC

## 2014-12-23 NOTE — Telephone Encounter (Signed)
fax was received requesting refills on quetiapine fumarate 50 mg take 1 tablet by mouth three times a day.   Pt has a appt for 02-12-15.

## 2014-12-29 ENCOUNTER — Other Ambulatory Visit: Payer: Self-pay

## 2014-12-29 DIAGNOSIS — F840 Autistic disorder: Secondary | ICD-10-CM | POA: Insufficient documentation

## 2014-12-29 DIAGNOSIS — F250 Schizoaffective disorder, bipolar type: Secondary | ICD-10-CM

## 2014-12-29 NOTE — Telephone Encounter (Signed)
2nd requested was sent over for refill

## 2014-12-29 NOTE — Telephone Encounter (Signed)
Pt needs refill on gabapentin 100mg  capsule take one capsule by mouth three times a day.  Pt last seen 11-13-14 and next appt is 02-12-15

## 2014-12-30 MED ORDER — GABAPENTIN 100 MG PO CAPS: 100.0000 mg | ORAL_CAPSULE | Freq: Three times a day (TID) | ORAL | Status: DC

## 2014-12-30 NOTE — Telephone Encounter (Signed)
Completed.

## 2015-01-02 ENCOUNTER — Other Ambulatory Visit: Payer: Self-pay | Admitting: Psychiatry

## 2015-01-05 ENCOUNTER — Other Ambulatory Visit: Payer: Self-pay

## 2015-01-05 NOTE — Telephone Encounter (Signed)
received a faxed requested for refill on divalproes sod er 500mg  take 4 tablets by mouth at bedtime.

## 2015-01-06 MED ORDER — DIVALPROEX SODIUM ER 500 MG PO TB24: 500.0000 mg | ORAL_TABLET | Freq: Every day | ORAL | Status: DC

## 2015-01-07 ENCOUNTER — Other Ambulatory Visit: Payer: Self-pay | Admitting: Psychiatry

## 2015-01-26 ENCOUNTER — Other Ambulatory Visit: Payer: Self-pay

## 2015-01-26 DIAGNOSIS — F840 Autistic disorder: Secondary | ICD-10-CM

## 2015-01-26 DIAGNOSIS — F259 Schizoaffective disorder, unspecified: Secondary | ICD-10-CM

## 2015-01-26 MED ORDER — SERTRALINE HCL 100 MG PO TABS: ORAL_TABLET | ORAL | Status: DC

## 2015-01-26 MED ORDER — LURASIDONE HCL 40 MG PO TABS: 40.0000 mg | ORAL_TABLET | Freq: Every day | ORAL | Status: DC

## 2015-01-26 NOTE — Telephone Encounter (Signed)
received two refill request for the following medications:   sertralin hcl 100mg  take two tablet by mouth once daily.  and latuda 40 mg take 1 table by mouth each day with breakfast.   Pt last seen 11-13-14 next appt 02-12-15

## 2015-02-12 ENCOUNTER — Encounter: Payer: Self-pay | Admitting: Psychiatry

## 2015-02-12 ENCOUNTER — Ambulatory Visit (INDEPENDENT_AMBULATORY_CARE_PROVIDER_SITE_OTHER): Payer: Medicare Other | Admitting: Psychiatry

## 2015-02-12 VITALS — BP 122/72 | HR 58 | Temp 97.2°F | Ht 67.0 in | Wt 215.6 lb

## 2015-02-12 DIAGNOSIS — F250 Schizoaffective disorder, bipolar type: Secondary | ICD-10-CM | POA: Diagnosis not present

## 2015-02-12 DIAGNOSIS — F840 Autistic disorder: Secondary | ICD-10-CM | POA: Diagnosis not present

## 2015-02-12 DIAGNOSIS — F259 Schizoaffective disorder, unspecified: Secondary | ICD-10-CM | POA: Diagnosis not present

## 2015-02-13 MED ORDER — LORAZEPAM 0.5 MG PO TABS: 0.5000 mg | ORAL_TABLET | Freq: Three times a day (TID) | ORAL | Status: DC

## 2015-02-13 MED ORDER — LAMOTRIGINE 200 MG PO TABS: 200.0000 mg | ORAL_TABLET | Freq: Every day | ORAL | Status: DC

## 2015-02-13 MED ORDER — QUETIAPINE FUMARATE 50 MG PO TABS: 50.0000 mg | ORAL_TABLET | Freq: Three times a day (TID) | ORAL | Status: DC

## 2015-02-13 MED ORDER — HALOPERIDOL 2 MG PO TABS: 2.0000 mg | ORAL_TABLET | ORAL | Status: DC | PRN

## 2015-02-13 MED ORDER — DIVALPROEX SODIUM ER 500 MG PO TB24: 2000.0000 mg | ORAL_TABLET | Freq: Every day | ORAL | Status: DC

## 2015-02-13 MED ORDER — GABAPENTIN 100 MG PO CAPS: 100.0000 mg | ORAL_CAPSULE | Freq: Three times a day (TID) | ORAL | Status: DC

## 2015-02-13 MED ORDER — LURASIDONE HCL 40 MG PO TABS: 40.0000 mg | ORAL_TABLET | Freq: Every day | ORAL | Status: DC

## 2015-02-13 MED ORDER — SERTRALINE HCL 100 MG PO TABS: ORAL_TABLET | ORAL | Status: DC

## 2015-02-13 NOTE — Progress Notes (Signed)
[facility] MD Progress Note  02/13/2015 6:12 PM Alexander Beard  MRN:  [id number] Subjective:  "I guess I'm okay" Principal Problem: @PPROB @ Diagnosis:   Patient Active Problem List   Diagnosis Date Noted  . 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 Schizoaffective disorder, bipolar type [F25.0] 12/29/2014  . 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 Anxiety state [F41.1] 12/29/2014  . 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 Schizophrenia, schizoaffective   . Autistic disorder, residual state   . Anxiety disorder due to known physiological condition   . Anxiety    History reviewed. No pertinent past surgical history. Family History:  Family History  Problem Relation Age of Onset  . Family history unknown: Yes   Social History:  History  Alcohol Use No     History  Drug Use No    History   Social History  . Marital Status: Single    Spouse Name: N/A  . Number of Children: N/A  . Years of Education: N/A   Social History Main Topics  . Smoking status: Never Smoker   . Smokeless tobacco: Never Used  . Alcohol Use: No  . Drug Use: No  . Sexual Activity: No   Other Topics Concern  . None   Social History Narrative  . None   Additional History:    Sleep: Good  Appetite:  Good   Assessment: Alexander Beard has the same concerns as usual about his independence. Everyone acknowledges that he has been controlling his behavior better and is less impulsive. Overall mood seems to be pretty good. No worsening no new complaints  Musculoskeletal: Strength & Muscle Tone: within normal limits Gait & Station: normal Patient leans: N/A   Psychiatric Specialty Exam: Physical Exam  ROS  Blood pressure 122/72, pulse 58, temperature 97.2 F (36.2 C), temperature source Tympanic, height 5\' 7"   (1.702 m), weight 215 lb 9.6 oz (97.796 kg), SpO2 91 %.Body mass index is 33.76 kg/(m^2).  General Appearance: Casual  Eye Contact::  Fair  Speech:  Clear and Coherent  Volume:  Normal  Mood:  Anxious  Affect:  Flat  Thought Process:  Goal Directed  Orientation:  Full (Time, Place, and Person)  Thought Content:  Negative  Suicidal Thoughts:  No  Homicidal Thoughts:  No  Memory:  Immediate;   Fair Recent;   Fair Remote;   Fair  Judgement:  Fair  Insight:  Fair  Psychomotor Activity:  Normal  Concentration:  Fair  Recall:  [employer] of Knowledge:Fair  Language: Fair  Akathisia:  No  Handed:  Right  AIMS (if indicated):     Assets:  Communication Skills Desire for Improvement Social Support  ADL's:  Intact  Cognition: WNL  Sleep:        Current Medications: Current Outpatient Prescriptions  Medication Sig Dispense Refill  . Cholecalciferol (VITAMIN D-1000 MAX ST) 1000 UNITS tablet Take by mouth.    . divalproex (DEPAKOTE ER) 500 MG 24 hr tablet Take 4 tablets (2,000 mg total) by mouth at bedtime. 120 tablet 3  . furosemide (LASIX) 20 MG tablet Take by mouth.    . gabapentin (NEURONTIN) 100 MG capsule Take 1 capsule (100 mg total) by mouth 3 (three) times daily. 90 capsule 3  . haloperidol (HALDOL) 2 MG tablet Take 1 tablet (2 mg total)  by mouth every 4 (four) hours as needed for agitation. 120 tablet 3  . lamoTRIgine (LAMICTAL) 200 MG tablet Take 1 tablet (200 mg total) by mouth at bedtime. 30 tablet 3  . loratadine (CLARITIN) 10 MG tablet Take 10 mg by mouth daily.    Marland Kitchen lurasidone (LATUDA) 40 MG TABS tablet Take 1 tablet (40 mg total) by mouth daily with breakfast. 30 tablet 3  . metoprolol succinate (TOPROL-XL) 50 MG 24 hr tablet Take by mouth.    . naproxen (NAPROSYN) 500 MG tablet Take by mouth.    . polyethylene glycol powder (MIRALAX) powder Take 1 Container by mouth once.    Marland Kitchen QUEtiapine (SEROQUEL) 50 MG tablet Take 1 tablet (50 mg total) by mouth 3 (three) times  daily. 90 tablet 3  . sertraline (ZOLOFT) 100 MG tablet Take two tablets (200mg ) by mouth once daily 60 tablet 3  . simvastatin (ZOCOR) 20 MG tablet Take 20 mg by mouth daily.    . sitaGLIPtin (JANUVIA) 100 MG tablet Take 100 mg by mouth daily.    . solifenacin (VESICARE) 5 MG tablet Take 5 mg by mouth daily.    . tamsulosin (FLOMAX) 0.4 MG CAPS capsule Take 0.4 mg by mouth.    Marland Kitchen lisinopril (PRINIVIL,ZESTRIL) 5 MG tablet Take by mouth.    Marland Kitchen LORazepam (ATIVAN) 0.5 MG tablet Take 1 tablet (0.5 mg total) by mouth 3 (three) times daily. 90 tablet 3   No current facility-administered medications for this visit.    Lab Results: No results found for this or any previous visit (from the past 48 hour(s)).  Physical Findings: AIMS:  , ,  ,  ,    CIWA:    COWS:     Treatment Plan Summary: Medication management and Plan Renewed all medications. Acknowledge that he is on a large and complicated medicine regimen but appears to be tolerating it well. No indication for any acute changes. Follow-up in 3 months. Supportive counseling   Medical Decision Making:  Review of Psycho-Social Stressors (1) and Review of Medication Regimen & Side Effects (2)     Alexander Beard 02/13/2015, 6:12 PM

## 2015-02-20 ENCOUNTER — Other Ambulatory Visit: Payer: Self-pay | Admitting: Psychiatry

## 2015-02-20 DIAGNOSIS — F251 Schizoaffective disorder, depressive type: Secondary | ICD-10-CM

## 2015-04-10 ENCOUNTER — Encounter: Payer: Self-pay | Admitting: Emergency Medicine

## 2015-04-10 ENCOUNTER — Emergency Department
Admission: EM | Admit: 2015-04-10 | Discharge: 2015-04-10 | Disposition: A | Discharge status: Other Acute Inpatient Hospital | Payer: Medicare Other | Attending: Emergency Medicine | Admitting: Emergency Medicine

## 2015-04-10 ENCOUNTER — Inpatient Hospital Stay
Admission: EM | Admit: 2015-04-10 | Discharge: 2015-04-17 | DRG: 885 | Disposition: A | Discharge status: Home / Self Care | Payer: Medicare Other | Source: Intra-hospital | Attending: Psychiatry | Admitting: Psychiatry

## 2015-04-10 DIAGNOSIS — G4730 Sleep apnea, unspecified: Secondary | ICD-10-CM | POA: Diagnosis present

## 2015-04-10 DIAGNOSIS — N400 Enlarged prostate without lower urinary tract symptoms: Secondary | ICD-10-CM | POA: Insufficient documentation

## 2015-04-10 DIAGNOSIS — E119 Type 2 diabetes mellitus without complications: Secondary | ICD-10-CM | POA: Insufficient documentation

## 2015-04-10 DIAGNOSIS — E785 Hyperlipidemia, unspecified: Secondary | ICD-10-CM | POA: Diagnosis present

## 2015-04-10 DIAGNOSIS — F840 Autistic disorder: Secondary | ICD-10-CM | POA: Diagnosis present

## 2015-04-10 DIAGNOSIS — Z79899 Other long term (current) drug therapy: Secondary | ICD-10-CM | POA: Diagnosis not present

## 2015-04-10 DIAGNOSIS — R451 Restlessness and agitation: Secondary | ICD-10-CM | POA: Insufficient documentation

## 2015-04-10 DIAGNOSIS — I10 Essential (primary) hypertension: Secondary | ICD-10-CM | POA: Diagnosis present

## 2015-04-10 DIAGNOSIS — Z791 Long term (current) use of non-steroidal anti-inflammatories (NSAID): Secondary | ICD-10-CM | POA: Insufficient documentation

## 2015-04-10 DIAGNOSIS — K219 Gastro-esophageal reflux disease without esophagitis: Secondary | ICD-10-CM | POA: Diagnosis present

## 2015-04-10 DIAGNOSIS — Z880 Allergy status to penicillin: Secondary | ICD-10-CM | POA: Diagnosis not present

## 2015-04-10 DIAGNOSIS — F259 Schizoaffective disorder, unspecified: Secondary | ICD-10-CM | POA: Diagnosis present

## 2015-04-10 DIAGNOSIS — Z794 Long term (current) use of insulin: Secondary | ICD-10-CM | POA: Insufficient documentation

## 2015-04-10 DIAGNOSIS — N3281 Overactive bladder: Secondary | ICD-10-CM | POA: Diagnosis present

## 2015-04-10 DIAGNOSIS — F250 Schizoaffective disorder, bipolar type: Secondary | ICD-10-CM | POA: Diagnosis present

## 2015-04-10 DIAGNOSIS — M1990 Unspecified osteoarthritis, unspecified site: Secondary | ICD-10-CM | POA: Diagnosis present

## 2015-04-10 DIAGNOSIS — Z888 Allergy status to other drugs, medicaments and biological substances status: Secondary | ICD-10-CM

## 2015-04-10 DIAGNOSIS — R45851 Suicidal ideations: Secondary | ICD-10-CM | POA: Diagnosis present

## 2015-04-10 HISTORY — DX: Sleep apnea, unspecified: G47.30

## 2015-04-10 HISTORY — DX: Unspecified urinary incontinence: R32

## 2015-04-10 HISTORY — DX: Unspecified disorder of psychological development: F89

## 2015-04-10 HISTORY — DX: Unspecified osteoarthritis, unspecified site: M19.90

## 2015-04-10 HISTORY — DX: Hyperlipidemia, unspecified: E78.5

## 2015-04-10 HISTORY — DX: Essential (primary) hypertension: I10

## 2015-04-10 HISTORY — DX: Overactive bladder: N32.81

## 2015-04-10 HISTORY — DX: Gastro-esophageal reflux disease without esophagitis: K21.9

## 2015-04-10 HISTORY — DX: Type 2 diabetes mellitus without complications: E11.9

## 2015-04-10 LAB — LIPID PANEL
CHOL/HDL RATIO: 3.1 ratio
Cholesterol: 153 mg/dL (ref 0–200)
HDL: 50 mg/dL (ref >40)
LDL CALC: 70 mg/dL (ref 0–99)
TRIGLYCERIDES: 166 mg/dL — AB (ref <150)
VLDL: 33 mg/dL (ref 0–40)

## 2015-04-10 LAB — URINE DRUG SCREEN, QUALITATIVE (ARMC ONLY)
Amphetamines, Ur Screen: NOT DETECTED
BARBITURATES, UR SCREEN: NOT DETECTED
BENZODIAZEPINE, UR SCRN: POSITIVE — AB
Cannabinoid 50 Ng, Ur ~~LOC~~: NOT DETECTED
Cocaine Metabolite,Ur ~~LOC~~: NOT DETECTED
MDMA (Ecstasy)Ur Screen: NOT DETECTED
METHADONE SCREEN, URINE: NOT DETECTED
Opiate, Ur Screen: NOT DETECTED
Phencyclidine (PCP) Ur S: NOT DETECTED
TRICYCLIC, UR SCREEN: POSITIVE — AB

## 2015-04-10 LAB — CBC
HCT: 40.3 % (ref 40.0–52.0)
HEMOGLOBIN: 13.5 g/dL (ref 13.0–18.0)
MCH: 32.3 pg (ref 26.0–34.0)
MCHC: 33.5 g/dL (ref 32.0–36.0)
MCV: 96.4 fL (ref 80.0–100.0)
Platelets: 125 10*3/uL — ABNORMAL LOW (ref 150–440)
RBC: 4.18 MIL/uL — ABNORMAL LOW (ref 4.40–5.90)
RDW: 13 % (ref 11.5–14.5)
WBC: 5 10*3/uL (ref 3.8–10.6)

## 2015-04-10 LAB — COMPREHENSIVE METABOLIC PANEL
ALBUMIN: 3.9 g/dL (ref 3.5–5.0)
ALT: 8 U/L — AB (ref 17–63)
AST: 26 U/L (ref 15–41)
Alkaline Phosphatase: 52 U/L (ref 38–126)
Anion gap: 5 (ref 5–15)
BILIRUBIN TOTAL: 0.8 mg/dL (ref 0.3–1.2)
BUN: 20 mg/dL (ref 6–20)
CHLORIDE: 104 mmol/L (ref 101–111)
CO2: 29 mmol/L (ref 22–32)
CREATININE: 1.04 mg/dL (ref 0.61–1.24)
Calcium: 9.1 mg/dL (ref 8.9–10.3)
GFR calc Af Amer: >60 mL/min (ref >60)
GLUCOSE: 163 mg/dL — AB (ref 65–99)
Potassium: 4.3 mmol/L (ref 3.5–5.1)
Sodium: 138 mmol/L (ref 135–145)
Total Protein: 7.1 g/dL (ref 6.5–8.1)

## 2015-04-10 LAB — GLUCOSE, CAPILLARY: Glucose-Capillary: 118 mg/dL — ABNORMAL HIGH (ref 65–99)

## 2015-04-10 LAB — SALICYLATE LEVEL: Salicylate Lvl: <4 mg/dL (ref 2.8–30.0)

## 2015-04-10 LAB — ACETAMINOPHEN LEVEL: Acetaminophen (Tylenol), Serum: <10 ug/mL — ABNORMAL LOW (ref 10–30)

## 2015-04-10 LAB — TSH: TSH: 2.776 u[IU]/mL (ref 0.350–4.500)

## 2015-04-10 LAB — VALPROIC ACID LEVEL: Valproic Acid Lvl: 73 ug/mL (ref 50.0–100.0)

## 2015-04-10 LAB — ETHANOL

## 2015-04-10 MED ORDER — LISINOPRIL 5 MG PO TABS
5.0000 mg | ORAL_TABLET | Freq: Every day | ORAL | Status: DC
  Administered 2015-04-11 – 2015-04-17 (×7): 5 mg via ORAL
  Filled 2015-04-10 (×7): qty 1

## 2015-04-10 MED ORDER — HALOPERIDOL 0.5 MG PO TABS: 2.0000 mg | ORAL_TABLET | Freq: Four times a day (QID) | ORAL | Status: DC | PRN

## 2015-04-10 MED ORDER — LISINOPRIL 5 MG PO TABS: 5.0000 mg | ORAL_TABLET | Freq: Every day | ORAL | Status: DC

## 2015-04-10 MED ORDER — LORATADINE 10 MG PO TABS
10.0000 mg | ORAL_TABLET | Freq: Every day | ORAL | Status: DC
  Administered 2015-04-11 – 2015-04-14 (×4): 10 mg via ORAL
  Filled 2015-04-10 (×4): qty 1

## 2015-04-10 MED ORDER — DARIFENACIN HYDROBROMIDE ER 7.5 MG PO TB24: 7.5000 mg | ORAL_TABLET | Freq: Every day | ORAL | Status: DC

## 2015-04-10 MED ORDER — SIMVASTATIN 20 MG PO TABS
20.0000 mg | ORAL_TABLET | Freq: Every day | ORAL | Status: DC
  Administered 2015-04-11 – 2015-04-16 (×6): 20 mg via ORAL
  Filled 2015-04-10 (×6): qty 1

## 2015-04-10 MED ORDER — LAMOTRIGINE 25 MG PO TABS: 200.0000 mg | ORAL_TABLET | Freq: Every day | ORAL | Status: DC

## 2015-04-10 MED ORDER — METOPROLOL SUCCINATE ER 50 MG PO TB24: 50.0000 mg | ORAL_TABLET | Freq: Every day | ORAL | Status: DC

## 2015-04-10 MED ORDER — QUETIAPINE FUMARATE 25 MG PO TABS
50.0000 mg | ORAL_TABLET | Freq: Three times a day (TID) | ORAL | Status: DC
  Administered 2015-04-10 – 2015-04-14 (×11): 50 mg via ORAL
  Filled 2015-04-10 (×12): qty 2

## 2015-04-10 MED ORDER — ACETAMINOPHEN 325 MG PO TABS: 650.0000 mg | ORAL_TABLET | Freq: Four times a day (QID) | ORAL | Status: DC | PRN

## 2015-04-10 MED ORDER — FUROSEMIDE 20 MG PO TABS
20.0000 mg | ORAL_TABLET | Freq: Every day | ORAL | Status: DC
  Administered 2015-04-11 – 2015-04-17 (×7): 20 mg via ORAL
  Filled 2015-04-10 (×7): qty 1

## 2015-04-10 MED ORDER — QUETIAPINE FUMARATE 300 MG PO TABS
300.0000 mg | ORAL_TABLET | Freq: Every day | ORAL | Status: DC
  Administered 2015-04-10 – 2015-04-13 (×4): 300 mg via ORAL
  Filled 2015-04-10 (×3): qty 1

## 2015-04-10 MED ORDER — QUETIAPINE FUMARATE 25 MG PO TABS: 50.0000 mg | ORAL_TABLET | Freq: Three times a day (TID) | ORAL | Status: DC

## 2015-04-10 MED ORDER — ALUM & MAG HYDROXIDE-SIMETH 200-200-20 MG/5ML PO SUSP
30.0000 mL | ORAL | Status: DC | PRN
Start: 2015-04-10 — End: 2015-04-17

## 2015-04-10 MED ORDER — VITAMIN D 1000 UNITS PO TABS
1000.0000 [IU] | ORAL_TABLET | Freq: Every day | ORAL | Status: DC
  Administered 2015-04-11 – 2015-04-17 (×7): 1000 [IU] via ORAL
  Filled 2015-04-10 (×7): qty 1

## 2015-04-10 MED ORDER — LURASIDONE HCL 40 MG PO TABS: 40.0000 mg | ORAL_TABLET | Freq: Every day | ORAL | Status: DC

## 2015-04-10 MED ORDER — INSULIN GLARGINE 100 UNIT/ML ~~LOC~~ SOLN: 15.0000 [IU] | Freq: Every day | SUBCUTANEOUS | Status: DC

## 2015-04-10 MED ORDER — LORATADINE 10 MG PO TABS: 10.0000 mg | ORAL_TABLET | Freq: Every day | ORAL | Status: DC

## 2015-04-10 MED ORDER — INSULIN GLARGINE 100 UNIT/ML ~~LOC~~ SOLN
15.0000 [IU] | Freq: Every day | SUBCUTANEOUS | Status: DC
  Administered 2015-04-10 – 2015-04-14 (×5): 15 [IU] via SUBCUTANEOUS
  Filled 2015-04-10 (×8): qty 0.15

## 2015-04-10 MED ORDER — DARIFENACIN HYDROBROMIDE ER 7.5 MG PO TB24
7.5000 mg | ORAL_TABLET | Freq: Every day | ORAL | Status: DC
  Administered 2015-04-11 – 2015-04-17 (×7): 7.5 mg via ORAL
  Filled 2015-04-10 (×7): qty 1

## 2015-04-10 MED ORDER — LINAGLIPTIN 5 MG PO TABS
5.0000 mg | ORAL_TABLET | Freq: Every day | ORAL | Status: DC
  Administered 2015-04-11 – 2015-04-17 (×7): 5 mg via ORAL
  Filled 2015-04-10 (×7): qty 1

## 2015-04-10 MED ORDER — LORAZEPAM 0.5 MG PO TABS: 0.5000 mg | ORAL_TABLET | Freq: Three times a day (TID) | ORAL | Status: DC

## 2015-04-10 MED ORDER — VITAMIN D3 25 MCG (1000 UNIT) PO TABS: 1000.0000 [IU] | ORAL_TABLET | Freq: Every day | ORAL | Status: DC

## 2015-04-10 MED ORDER — LAMOTRIGINE 100 MG PO TABS
200.0000 mg | ORAL_TABLET | Freq: Every day | ORAL | Status: DC
  Administered 2015-04-11 – 2015-04-17 (×7): 200 mg via ORAL
  Filled 2015-04-10 (×7): qty 2

## 2015-04-10 MED ORDER — SIMVASTATIN 40 MG PO TABS: 20.0000 mg | ORAL_TABLET | Freq: Every day | ORAL | Status: DC

## 2015-04-10 MED ORDER — LURASIDONE HCL 40 MG PO TABS
40.0000 mg | ORAL_TABLET | Freq: Every day | ORAL | Status: DC
  Administered 2015-04-11: 40 mg via ORAL
  Filled 2015-04-10: qty 1

## 2015-04-10 MED ORDER — TAMSULOSIN HCL 0.4 MG PO CAPS: 0.4000 mg | ORAL_CAPSULE | Freq: Every day | ORAL | Status: DC

## 2015-04-10 MED ORDER — DIVALPROEX SODIUM ER 500 MG PO TB24: 2000.0000 mg | ORAL_TABLET | Freq: Every day | ORAL | Status: DC

## 2015-04-10 MED ORDER — LORAZEPAM 0.5 MG PO TABS
0.5000 mg | ORAL_TABLET | Freq: Three times a day (TID) | ORAL | Status: DC
  Administered 2015-04-10 – 2015-04-14 (×11): 0.5 mg via ORAL
  Filled 2015-04-10 (×12): qty 1

## 2015-04-10 MED ORDER — SERTRALINE HCL 100 MG PO TABS
200.0000 mg | ORAL_TABLET | Freq: Every day | ORAL | Status: DC
  Administered 2015-04-11 – 2015-04-17 (×7): 200 mg via ORAL
  Filled 2015-04-10 (×7): qty 2

## 2015-04-10 MED ORDER — POLYETHYLENE GLYCOL 3350 17 G PO PACK
17.0000 g | PACK | Freq: Every day | ORAL | Status: DC
  Administered 2015-04-12 – 2015-04-17 (×6): 17 g via ORAL
  Filled 2015-04-10 (×6): qty 1

## 2015-04-10 MED ORDER — FUROSEMIDE 40 MG PO TABS: 20.0000 mg | ORAL_TABLET | Freq: Every day | ORAL | Status: DC

## 2015-04-10 MED ORDER — HALOPERIDOL 2 MG PO TABS: 2.0000 mg | ORAL_TABLET | Freq: Four times a day (QID) | ORAL | Status: DC | PRN

## 2015-04-10 MED ORDER — SERTRALINE HCL 100 MG PO TABS: 200.0000 mg | ORAL_TABLET | Freq: Every day | ORAL | Status: DC

## 2015-04-10 MED ORDER — QUETIAPINE FUMARATE 25 MG PO TABS: 300.0000 mg | ORAL_TABLET | Freq: Every day | ORAL | Status: DC

## 2015-04-10 MED ORDER — TAMSULOSIN HCL 0.4 MG PO CAPS
0.4000 mg | ORAL_CAPSULE | Freq: Every day | ORAL | Status: DC
  Administered 2015-04-10 – 2015-04-16 (×7): 0.4 mg via ORAL
  Filled 2015-04-10 (×7): qty 1

## 2015-04-10 MED ORDER — LINAGLIPTIN 5 MG PO TABS: 5.0000 mg | ORAL_TABLET | Freq: Every day | ORAL | Status: DC

## 2015-04-10 MED ORDER — DIVALPROEX SODIUM ER 500 MG PO TB24
2000.0000 mg | ORAL_TABLET | Freq: Every day | ORAL | Status: DC
  Administered 2015-04-10 – 2015-04-14 (×5): 2000 mg via ORAL
  Filled 2015-04-10 (×6): qty 4

## 2015-04-10 MED ORDER — METOPROLOL SUCCINATE ER 25 MG PO TB24
50.0000 mg | ORAL_TABLET | Freq: Every day | ORAL | Status: DC
  Administered 2015-04-11 – 2015-04-17 (×7): 50 mg via ORAL
  Filled 2015-04-10 (×7): qty 2

## 2015-04-10 MED ORDER — MAGNESIUM HYDROXIDE 400 MG/5ML PO SUSP: 30.0000 mL | Freq: Every day | ORAL | Status: DC | PRN

## 2015-04-10 MED ORDER — GABAPENTIN 100 MG PO CAPS
100.0000 mg | ORAL_CAPSULE | Freq: Three times a day (TID) | ORAL | Status: DC
  Administered 2015-04-10 – 2015-04-13 (×8): 100 mg via ORAL
  Filled 2015-04-10 (×9): qty 1

## 2015-04-10 MED ORDER — GABAPENTIN 100 MG PO CAPS: 100.0000 mg | ORAL_CAPSULE | Freq: Three times a day (TID) | ORAL | Status: DC

## 2015-04-10 MED ORDER — POLYETHYLENE GLYCOL 3350 17 G PO PACK: 17.0000 g | PACK | Freq: Every day | ORAL | Status: DC

## 2015-04-10 NOTE — ED Notes (Signed)
BEHAVIORAL HEALTH ROUNDING Patient sleeping: No. Patient alert and oriented: yes Behavior appropriate: Yes.  ; If no, describe:  Nutrition and fluids offered: Yes  Toileting and hygiene offered: Yes  Sitter present: yes Law enforcement present: Yes  

## 2015-04-10 NOTE — Tx Team (Signed)
Initial Interdisciplinary Treatment Plan   PATIENT STRESSORS: Health problems Traumatic event   PATIENT STRENGTHS: Communication skills Motivation for treatment/growth Special hobby/interest Supportive family/friends   PROBLEM LIST: Problem List/Patient Goals Date to be addressed Date deferred Reason deferred Estimated date of resolution  Schizoaffective disorder. 04/10/2015     Anxiety 04/10/2015                                                DISCHARGE CRITERIA:  Ability to meet basic life and health needs Adequate post-discharge living arrangements Motivation to continue treatment in a less acute level of care  PRELIMINARY DISCHARGE PLAN: Attend PHP/IOP Return to previous living arrangement  PATIENT/FAMIILY INVOLVEMENT: This treatment plan has been presented to and reviewed with the patient, ERIS BRECK, and/or family member,   The patient and family have been given the opportunity to ask questions and make suggestions.  Rica Records Kennis Wissmann 04/10/2015, 7:03 PM

## 2015-04-10 NOTE — BH Assessment (Signed)
Assessment Note  Alexander Beard is an 59 y.o. male who presents to the ER due to his Group Home due to them being unable to control his behaviors. Patient has history of getting upset and agitated but most of the time he is able to be redirected and symptoms are manageable. However, today the patient was unable to be redirected and calmed down.  Writer made attempts to talk with the patient but he would ramble about his family and staff at the [facility]. He expressed how he was upset with them.  Diagnosis: Schizoaffective   Past Medical History:  Past Medical History  Diagnosis Date  . Schizophrenia, schizoaffective   . Autistic disorder, residual state   . Anxiety disorder due to known physiological condition   . Anxiety   . Esophageal reflux   . Diabetes mellitus without complication   . Hypertension   . Developmental disorder   . Arthritis   . Dyslipidemia   . Sleep apnea   . Overactive bladder   . Urinary incontinence     History reviewed. No pertinent past surgical history.  Family History:  Family History  Problem Relation Age of Onset  . Family history unknown: Yes    Social History:  reports that he has never smoked. He has never used smokeless tobacco. He reports that he does not drink alcohol or use illicit drugs.  Additional Social History:  Alcohol / Drug Use Pain Medications: None Reported Prescriptions: None Reported Over the Counter: None Reported History of alcohol / drug use?: No history of alcohol / drug abuse Longest period of sobriety (when/how long):  (None Reported) Negative Consequences of Use:  (None Reported) Withdrawal Symptoms:  (None Reported)  CIWA: CIWA-Ar BP: 106/80 mmHg Pulse Rate: 93 COWS:    Allergies:  Allergies  Allergen Reactions  . Other Other (See Comments)    MYACINS  . Penicillins Nausea And Vomiting    Home Medications:  (Not in a hospital admission)  OB/GYN Status:  No LMP for male patient.  General Assessment  Data Location of Assessment: [facility] ED TTS Assessment: In system Is this a Tele or Face-to-Face Assessment?: Face-to-Face Is this an Initial Assessment or a Re-assessment for this encounter?: Initial Assessment Marital status: Single Maiden name: n/a Is patient pregnant?: No Pregnancy Status: Other (Comment) (Group Home) Living Arrangements: Group Home Can pt return to current living arrangement?: Yes Admission Status: Involuntary Is patient capable of signing voluntary admission?: No Referral Source: Self/Family/Friend Insurance type: Medicare  Medical Screening Exam ([place]) Medical Exam completed: Yes  Crisis Care Plan Living Arrangements: Group Home Name of Psychiatrist: [name] Name of Therapist: n/a  Education Status Is patient currently in school?: No Highest grade of school patient has completed: Unknown Name of school: n/a Contact person: n/a  Risk to self with the past 6 months Suicidal Ideation: No Has patient been a risk to self within the past 6 months prior to admission? : No Suicidal Intent: No Has patient had any suicidal intent within the past 6 months prior to admission? : No Is patient at risk for suicide?: No Suicidal Plan?: No Has patient had any suicidal plan within the past 6 months prior to admission? : No Access to Means:  (n/a) What has been your use of drugs/alcohol within the last 12 months?: None Reported Previous Attempts/Gestures: No How many times?: 0 Other Self Harm Risks: None Reported Triggers for Past Attempts: Unknown Intentional Self Injurious Behavior: None Family Suicide History: No  Recent stressful life event(s): Other (Comment) Persecutory voices/beliefs?: No Depression: Yes Depression Symptoms: Feeling angry/irritable, Tearfulness Substance abuse history and/or treatment for substance abuse?: No Suicide prevention information given to non-admitted patients: Not applicable  Risk to Others within the past 6  months Homicidal Ideation: No Does patient have any lifetime risk of violence toward others beyond the six months prior to admission? : No Thoughts of Harm to Others: No Current Homicidal Intent: No Current Homicidal Plan: No Access to Homicidal Means: No Identified Victim: None Reported History of harm to others?: No Assessment of Violence: None Noted Violent Behavior Description: None Reported Does patient have access to weapons?: No Criminal Charges Pending?: No Does patient have a court date: No Is patient on probation?: No  Psychosis Hallucinations: None noted Delusions: Persecutory, Unspecified  Mental Status Report Appearance/Hygiene: In hospital gown, In scrubs Eye Contact: Poor Motor Activity: Freedom of movement, Unremarkable Speech: Rapid Level of Consciousness: Alert, Irritable Mood: Anxious, Suspicious, Irritable Affect: Appropriate to circumstance, Anxious Anxiety Level: Moderate Thought Processes: Thought Blocking, Irrelevant Judgement: Impaired Orientation: Person, Place, Situation Obsessive Compulsive Thoughts/Behaviors: Moderate  Cognitive Functioning Concentration: Decreased Memory: Recent Impaired, Remote Impaired IQ: Above Average Insight: Poor Impulse Control: Poor Appetite: Fair Weight Loss: 0 Weight Gain: 0 Sleep: Decreased Vegetative Symptoms: None  ADLScreening [facility] Assessment Services) Patient's cognitive ability adequate to safely complete daily activities?: Yes Patient able to express need for assistance with ADLs?: Yes Independently performs ADLs?: Yes (appropriate for developmental age)  Prior Inpatient Therapy Prior Inpatient Therapy: Yes Prior Therapy Dates: 08/2014 & 11/2012 Prior Therapy Facilty/Provider(s): [facility] Reason for Treatment: Schizoaffective  Prior Outpatient Therapy Prior Outpatient Therapy: Yes Prior Therapy Dates: Current Prior Therapy Facilty/Provider(s): [facility] Reason for Treatment:  Schizoaffective D/O Does patient have an ACCT team?: No Does patient have Intensive In-House Services?  : No Does patient have [facility] services? : No Does patient have P4CC services?: No  ADL Screening (condition at time of admission) Patient's cognitive ability adequate to safely complete daily activities?: Yes Patient able to express need for assistance with ADLs?: Yes Independently performs ADLs?: Yes (appropriate for developmental age)       Abuse/Neglect Assessment (Assessment to be complete while patient is alone) Physical Abuse: Denies Verbal Abuse: Denies Sexual Abuse: Denies Exploitation of patient/patient's resources: Denies Self-Neglect: Denies Values / Beliefs Cultural Requests During Hospitalization: None Spiritual Requests During Hospitalization: None Consults Spiritual Care Consult Needed: No Social Work Consult Needed: No Regulatory affairs officer (For Healthcare) Does patient have an advance directive?: No    Additional Information 1:1 In Past 12 Months?: No CIRT Risk: No Elopement Risk: No Does patient have medical clearance?: Yes  Child/Adolescent Assessment Running Away Risk: Denies (Patient is an adult)  Disposition:  Disposition Initial Assessment Completed for this Encounter: Yes Disposition of Patient: Inpatient treatment program Type of inpatient treatment program: Adult  On Site Evaluation by:   Reviewed with Physician:    Gunnar Fusi, MS, LCAS, [place], [place], CCSI 04/10/2015 4:58 PM

## 2015-04-10 NOTE — ED Notes (Signed)
ENVIRONMENTAL ASSESSMENT Potentially harmful objects out of patient reach: Yes.   Personal belongings secured: Yes.   Patient dressed in hospital provided attire only: Yes.   Plastic bags out of patient reach: Yes.   Patient care equipment (cords, cables, call bells, lines, and drains) shortened, removed, or accounted for: Yes.   Equipment and supplies removed from bottom of stretcher: Yes.   Potentially toxic materials out of patient reach: Yes.   Sharps container removed or out of patient reach: Yes.     BEHAVIORAL HEALTH ROUNDING Patient sleeping: No. Patient alert and oriented: yes Behavior appropriate: Yes.  ; If no, describe:  Nutrition and fluids offered: Yes  Toileting and hygiene offered: Yes  Sitter present: yes Law enforcement present: Yes    

## 2015-04-10 NOTE — [facility] Counselor (Signed)
Pt. is to be admitted to [facility] by [name]. Attending Physician will be Dr. Bary Leriche.  Pt. has been assigned to room 314, by [place].  Intake Paper Work has been signed and placed on pt. chart. ER staff Lattie Haw, ER Sect.; Dr. Kerman Passey, ER MD; [facility]) have been made aware of the admission.

## 2015-04-10 NOTE — ED Provider Notes (Signed)
[facility] Emergency Department Provider Note  ____________________________________________  Time seen: Approximately 2:08 PM  I have reviewed the triage vital signs and the nursing notes.   HISTORY  Chief Complaint Agitation    HPI Alexander Beard is a 59 y.o. male who comes from a group home. Patient was reportedly agitated the group home. Patient reports he really doesn't like the group home because he can go outside and do anything like he is used to doing. The patient told the nurse he doesn't want to live anymore.   Past Medical History  Diagnosis Date  . Schizophrenia, schizoaffective   . Autistic disorder, residual state   . Anxiety disorder due to known physiological condition   . Anxiety   . Esophageal reflux   . Diabetes mellitus without complication   . Hypertension   . Developmental disorder   . Arthritis   . Dyslipidemia   . Sleep apnea   . Overactive bladder   . Urinary incontinence     Patient Active Problem List   Diagnosis Date Noted  . Anxiety disorder due to known physiological condition 12/29/2014  . Infant autism 12/29/2014  . Schizoaffective disorder, bipolar type 12/29/2014  . Active autistic disorder 12/29/2014  . Anxiety state 12/29/2014  . Schizo-affective psychosis 12/29/2014    History reviewed. No pertinent past surgical history.  Current Outpatient Rx  Name  Route  Sig  Dispense  Refill  . Cholecalciferol (VITAMIN D-1000 MAX ST) 1000 UNITS tablet   Oral   Take 5,000 Units by mouth.          . divalproex (DEPAKOTE ER) 500 MG 24 hr tablet   Oral   Take 4 tablets (2,000 mg total) by mouth at bedtime.   120 tablet   3   . furosemide (LASIX) 20 MG tablet   Oral   Take by mouth.         . gabapentin (NEURONTIN) 100 MG capsule   Oral   Take 1 capsule (100 mg total) by mouth 3 (three) times daily.   90 capsule   3   . haloperidol (HALDOL) 2 MG tablet   Oral   Take 1 tablet (2 mg total) by mouth  every 4 (four) hours as needed for agitation.   120 tablet   3   . insulin glargine (LANTUS) 100 UNIT/ML injection   Subcutaneous   Inject 15 Units into the skin at bedtime. Hold is CBG below 100.         . lamoTRIgine (LAMICTAL) 200 MG tablet   Oral   Take 1 tablet (200 mg total) by mouth at bedtime.   30 tablet   3   . lisinopril (PRINIVIL,ZESTRIL) 5 MG tablet   Oral   Take by mouth.         Marland Kitchen LORazepam (ATIVAN) 0.5 MG tablet   Oral   Take 1 tablet (0.5 mg total) by mouth 3 (three) times daily.   90 tablet   3   . lurasidone (LATUDA) 40 MG TABS tablet   Oral   Take 1 tablet (40 mg total) by mouth daily with breakfast.   30 tablet   3   . metoprolol succinate (TOPROL-XL) 50 MG 24 hr tablet   Oral   Take by mouth.         . naproxen (NAPROSYN) 500 MG tablet   Oral   Take by mouth.         . QUEtiapine (SEROQUEL) 300 MG  tablet   Oral   Take 300 mg by mouth at bedtime.         Marland Kitchen QUEtiapine (SEROQUEL) 50 MG tablet   Oral   Take 1 tablet (50 mg total) by mouth 3 (three) times daily.   90 tablet   3   . sertraline (ZOLOFT) 100 MG tablet      Take two tablets (200mg ) by mouth once daily   60 tablet   3   . simvastatin (ZOCOR) 20 MG tablet   Oral   Take 20 mg by mouth daily.         . sitaGLIPtin (JANUVIA) 100 MG tablet   Oral   Take 100 mg by mouth daily.         . solifenacin (VESICARE) 5 MG tablet   Oral   Take 5 mg by mouth daily.         . tamsulosin (FLOMAX) 0.4 MG CAPS capsule   Oral   Take 0.4 mg by mouth.         . loratadine (CLARITIN) 10 MG tablet   Oral   Take 10 mg by mouth daily.         . polyethylene glycol powder (MIRALAX) powder   Oral   Take 1 Container by mouth once.           Allergies Other and Penicillins  Family History  Problem Relation Age of Onset  . Family history unknown: Yes    Social History Social History  Substance Use Topics  . Smoking status: Never Smoker   . Smokeless  tobacco: Never Used  . Alcohol Use: No    Review of Systems Constitutional: No fever/chills Eyes: No visual changes. ENT: No sore throat. Cardiovascular: Denies chest pain. Respiratory: Denies shortness of breath. Gastrointestinal: No abdominal pain.  No nausea, no vomiting.  No diarrhea.  No constipation. Genitourinary: Negative for dysuria. Musculoskeletal: Negative for back pain. Skin: Negative for rash. Neurological: Negative for headaches, focal weakness or numbness.  10-point ROS otherwise negative.  ____________________________________________   PHYSICAL EXAM:  VITAL SIGNS: ED Triage Vitals  Enc Vitals Group     BP 04/10/15 1239 106/80 mmHg     Pulse Rate 04/10/15 1239 93     Resp 04/10/15 1239 20     Temp 04/10/15 1239 99.1 F (37.3 C)     Temp Source 04/10/15 1239 Oral     SpO2 04/10/15 1239 97 %     Weight 04/10/15 1239 215 lb (97.523 kg)     Height 04/10/15 1239 5\' 9"  (1.753 m)     Head Cir --      Peak Flow --      Pain Score --      Pain Loc --      Pain Edu? --      Excl. in [place]? --     Constitutional: Alert and oriented but anxious. Well appearing and in no acute distress. Eyes: Conjunctivae are normal. PERRL. EOMI. Head: Atraumatic. Nose: No congestion/rhinnorhea. Mouth/Throat: Mucous membranes are moist.  Oropharynx non-erythematous. Neck: No stridor Cardiovascular: Normal rate, regular rhythm. Grossly normal heart sounds.  Good peripheral circulation. Respiratory: Normal respiratory effort.  No retractions. Lungs CTAB. Gastrointestinal: Soft and nontender. No distention. No abdominal bruits. No CVA tenderness. Musculoskeletal: No lower extremity tenderness nor edema.  No joint effusions. Neurologic:  Normal speech and language. Patient has a tremor but if he is distracted the tremor goes away. Patient does not have any numbness or  tingling in his extremities on testing. Skin:  Skin is warm, dry and intact. No rash  noted.   ____________________________________________   LABS (all labs ordered are listed, but only abnormal results are displayed)  Labs Reviewed  COMPREHENSIVE METABOLIC PANEL - Abnormal; Notable for the following:    Glucose, Bld 163 (*)    ALT 8 (*)    All other components within normal limits  ACETAMINOPHEN LEVEL - Abnormal; Notable for the following:    Acetaminophen (Tylenol), Serum <10 (*)    All other components within normal limits  CBC - Abnormal; Notable for the following:    RBC 4.18 (*)    Platelets 125 (*)    All other components within normal limits  ETHANOL  SALICYLATE LEVEL  VALPROIC ACID LEVEL  URINE DRUG SCREEN, QUALITATIVE ([facility] ONLY)   ____________________________________________  EKG   ____________________________________________  RADIOLOGY   ____________________________________________   PROCEDURES    ____________________________________________   INITIAL IMPRESSION / ASSESSMENT AND PLAN / ED COURSE  Pertinent labs & imaging results that were available during my care of the patient were reviewed by me and considered in my medical decision making (see chart for details).   ____________________________________________   FINAL CLINICAL IMPRESSION(S) / ED DIAGNOSES  Final diagnoses:  Agitation      Nena Polio, MD 04/10/15 1414

## 2015-04-10 NOTE — Progress Notes (Signed)
59yrs old male admitted schizoaffective disorder.He got agitated in the group home & stated that he was not happy at the group home.He was tearful,states "I have suicidal thoughts all the time."Contracts for safety.Skin assessment & body search done on admission,no contraband found.

## 2015-04-10 NOTE — Consult Note (Signed)
Alexander Beard   Reason for Beard:  Beard for this 59 year old man with a history of schizoaffective disorder and possible developmental disorder who has become uncontrollable at his current group home Referring Physician:  Cinda Quest Patient Identification: Alexander Beard MRN:  [id number] Principal Diagnosis: Schizoaffective disorder, bipolar type Diagnosis:   Patient Active Problem List   Diagnosis Date Noted  . Diabetes [E11.9] 04/10/2015  . Hypertension [I10] 04/10/2015  . Prostate hypertrophy [N40.0] 04/10/2015  . 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 Schizoaffective disorder, bipolar type [F25.0] 12/29/2014  . 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 Anxiety state [F41.1] 12/29/2014  . Schizo-affective psychosis [F25.9] 12/29/2014    Total Time spent with patient: 1 hour  Subjective:   Alexander Beard is a 59 y.o. male patient admitted with "I just can't stand it".  HPI:  Information from the patient from the chart and from conversation with his guardian, his brother Alexander Beard, and also with the manager of his group home. I was actually page to this morning by the manager of the group home to tell me that Alexander Beard had been decompensating and acting out with agitated behavior all week. Apparently climaxed this morning when he ran away and they had to mount an effort to go and track him down to bring him back. Patient has been agitated over the same issues that he always is upset about, namely his wish that he was allowed to walk around town without any restrictions and do whatever activity he wanted to do. It's reported that he is been sleeping poorly. Waking up very early in the morning upsetting the group home. Mood is been labile and angry. He has made suicidal statements although without any specific action on it. There is no known changed any of his medicine. No known medical problem.  He's had multiple chronic stresses and acute stresses. 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 Nothing else more abrupt that I can identify.  Past psychiatric history: Essentially lifelong history of mental illness carries a diagnosis of schizoaffective disorder although I think autistic spectrum disorder is probably a more comprehensive description of his problem. Has had several hospitalizations in the past. Can get agitated but not really violent. No known suicide attempts. He is on a very large and complicated number of medications but I have been monitoring them and it has been difficult to try and decrease or change anything.  Medical problem history: Patient has diabetes requiring oral medicine and insulin. Also has high blood pressure and chronic constipation and an enlarged prostate. He is on multiple medications.    Family history: There is no known family history of other mental illness  Substance abuse history: Patient does not drink or abuse drugs no past history of any substance abuse.  Social history: Patient's brother Alexander Beard is his guardian. I have known Alexander Beard for several years and Alexander Beard is extremely helpful to his brother. Every weekend he spends time with Alexander Beard taking him out to do activities. Alexander Beard lives in a group home. He has had multiple group home placements in the past and in my opinion this current one is by far the best situation that he could hope for. They have been very tolerant and accommodating of his behavior for the last few years.  Current medications: It's a long list. See the current list of medicines.  Past Psychiatric History: As noted above he has a  lifelong history of mental health problems. He has periodic decompensations where he becomes agitated and difficult to manage. He carries a diagnosis of schizoaffective disorder but I have long thought that he is perhaps more autistic or mood spectrum disordered. He really doesn't  get hallucinations although he will get confused and paranoid at times. No history of suicide attempts or violence to others.  Risk to Self: Is patient at risk for suicide?: No Risk to Others:   Prior Inpatient Therapy:   Prior Outpatient Therapy:    Past Medical History:  Past Medical History  Diagnosis Date  . Schizophrenia, schizoaffective   . Autistic disorder, residual state   . Anxiety disorder due to known physiological condition   . Anxiety   . Esophageal reflux   . Diabetes mellitus without complication   . Hypertension   . Developmental disorder   . Arthritis   . Dyslipidemia   . Sleep apnea   . Overactive bladder   . Urinary incontinence    History reviewed. No pertinent past surgical history. Family History:  Family History  Problem Relation Age of Onset  . Family history unknown: Yes   Family Psychiatric  History: There is no Family history of mental illness Social History:  History  Alcohol Use No     History  Drug Use No    Social History   Social History  . Marital Status: Single    Spouse Name: N/A  . Number of Children: N/A  . Years of Education: N/A   Social History Main Topics  . Smoking status: Never Smoker   . Smokeless tobacco: Never Used  . Alcohol Use: No  . Drug Use: No  . Sexual Activity: No   Other Topics Concern  . None   Social History Narrative   Additional Social History:                          Allergies:   Allergies  Allergen Reactions  . Other Other (See Comments)    MYACINS  . Penicillins Nausea And Vomiting    Labs:  Results for orders placed or performed during the hospital encounter of 04/10/15 (from the past 48 hour(s))  Comprehensive metabolic panel     Status: Abnormal   Collection Time: 04/10/15 12:55 PM  Result Value Ref Range   Sodium 138 135 - 145 mmol/L   Potassium 4.3 3.5 - 5.1 mmol/L   Chloride 104 101 - 111 mmol/L   CO2 29 22 - 32 mmol/L   Glucose, Bld 163 (H) 65 - 99 mg/dL   BUN  20 6 - 20 mg/dL   Creatinine, Ser 1.04 0.61 - 1.24 mg/dL   Calcium 9.1 8.9 - 10.3 mg/dL   Total Protein 7.1 6.5 - 8.1 g/dL   Albumin 3.9 3.5 - 5.0 g/dL   AST 26 15 - 41 U/L   ALT 8 (L) 17 - 63 U/L   Alkaline Phosphatase 52 38 - 126 U/L   Total Bilirubin 0.8 0.3 - 1.2 mg/dL   GFR calc non Af Amer >60 >60 mL/min   GFR calc Af Amer >60 >60 mL/min    Comment: (NOTE) The eGFR has been calculated using the CKD EPI equation. This calculation has not been validated in all clinical situations. eGFR's persistently <60 mL/min signify possible Chronic Kidney Disease.    Anion gap 5 5 - 15  Ethanol (ETOH)     Status: None   Collection Time: 04/10/15  12:55 PM  Result Value Ref Range   Alcohol, Ethyl (B) <5 <5 mg/dL    Comment:        LOWEST DETECTABLE LIMIT FOR SERUM ALCOHOL IS 5 mg/dL FOR MEDICAL PURPOSES ONLY   Salicylate level     Status: None   Collection Time: 04/10/15 12:55 PM  Result Value Ref Range   Salicylate Lvl <6.5 2.8 - 30.0 mg/dL  Acetaminophen level     Status: Abnormal   Collection Time: 04/10/15 12:55 PM  Result Value Ref Range   Acetaminophen (Tylenol), Serum <10 (L) 10 - 30 ug/mL    Comment:        THERAPEUTIC CONCENTRATIONS VARY SIGNIFICANTLY. A RANGE OF 10-30 ug/mL MAY BE AN EFFECTIVE CONCENTRATION FOR MANY PATIENTS. HOWEVER, SOME ARE BEST TREATED AT CONCENTRATIONS OUTSIDE THIS RANGE. ACETAMINOPHEN CONCENTRATIONS >150 ug/mL AT 4 HOURS AFTER INGESTION AND >50 ug/mL AT 12 HOURS AFTER INGESTION ARE OFTEN ASSOCIATED WITH TOXIC REACTIONS.   CBC     Status: Abnormal   Collection Time: 04/10/15 12:55 PM  Result Value Ref Range   WBC 5.0 3.8 - 10.6 K/uL   RBC 4.18 (L) 4.40 - 5.90 MIL/uL   Hemoglobin 13.5 13.0 - 18.0 g/dL   HCT 40.3 40.0 - 52.0 %   MCV 96.4 80.0 - 100.0 fL   MCH 32.3 26.0 - 34.0 pg   MCHC 33.5 32.0 - 36.0 g/dL   RDW 13.0 11.5 - 14.5 %   Platelets 125 (L) 150 - 440 K/uL  Valproic acid level     Status: None   Collection Time: 04/10/15  12:55 PM  Result Value Ref Range   Valproic Acid Lvl 73 50.0 - 100.0 ug/mL    Current Facility-Administered Medications  Medication Dose Route Frequency Provider Last Rate Last Dose  . cholecalciferol (VITAMIN D) tablet 1,000 Units  1,000 Units Oral Daily Gonzella Lex, MD      . darifenacin (ENABLEX) 24 hr tablet 7.5 mg  7.5 mg Oral Daily Gonzella Lex, MD      . divalproex (DEPAKOTE ER) 24 hr tablet 2,000 mg  2,000 mg Oral QHS Gonzella Lex, MD      . furosemide (LASIX) tablet 20 mg  20 mg Oral Daily Gonzella Lex, MD      . gabapentin (NEURONTIN) capsule 100 mg  100 mg Oral TID Gonzella Lex, MD      . haloperidol (HALDOL) tablet 2 mg  2 mg Oral Q6H PRN Gonzella Lex, MD      . insulin glargine (LANTUS) injection 15 Units  15 Units Subcutaneous QHS Gonzella Lex, MD      . lamoTRIgine (LAMICTAL) tablet 200 mg  200 mg Oral Daily Gonzella Lex, MD      . linagliptin (TRADJENTA) tablet 5 mg  5 mg Oral Daily [name] [name], MD      . lisinopril (PRINIVIL,ZESTRIL) tablet 5 mg  5 mg Oral Daily Gonzella Lex, MD      . loratadine (CLARITIN) tablet 10 mg  10 mg Oral Daily Gonzella Lex, MD      . LORazepam (ATIVAN) tablet 0.5 mg  0.5 mg Oral TID Gonzella Lex, MD      . Derrill Memo ON 04/11/2015] lurasidone (LATUDA) tablet 40 mg  40 mg Oral Q breakfast Gonzella Lex, MD      . metoprolol succinate (TOPROL-XL) 24 hr tablet 50 mg  50 mg Oral Daily Gonzella Lex, MD      .  polyethylene glycol (MIRALAX / GLYCOLAX) packet 17 g  17 g Oral Daily [name] [name], MD      . QUEtiapine (SEROQUEL) tablet 300 mg  300 mg Oral QHS Gonzella Lex, MD      . QUEtiapine (SEROQUEL) tablet 50 mg  50 mg Oral TID Gonzella Lex, MD      . sertraline (ZOLOFT) tablet 200 mg  200 mg Oral Daily Gonzella Lex, MD      . simvastatin (ZOCOR) tablet 20 mg  20 mg Oral q1800 Gonzella Lex, MD      . tamsulosin (FLOMAX) capsule 0.4 mg  0.4 mg Oral QPC supper Gonzella Lex, MD       Current Outpatient Prescriptions   Medication Sig Dispense Refill  . Cholecalciferol (VITAMIN D-1000 MAX ST) 1000 UNITS tablet Take 5,000 Units by mouth.     . divalproex (DEPAKOTE ER) 500 MG 24 hr tablet Take 4 tablets (2,000 mg total) by mouth at bedtime. 120 tablet 3  . furosemide (LASIX) 20 MG tablet Take by mouth.    . gabapentin (NEURONTIN) 100 MG capsule Take 1 capsule (100 mg total) by mouth 3 (three) times daily. 90 capsule 3  . haloperidol (HALDOL) 2 MG tablet Take 1 tablet (2 mg total) by mouth every 4 (four) hours as needed for agitation. 120 tablet 3  . insulin glargine (LANTUS) 100 UNIT/ML injection Inject 15 Units into the skin at bedtime. Hold is CBG below 100.    . lamoTRIgine (LAMICTAL) 200 MG tablet Take 1 tablet (200 mg total) by mouth at bedtime. 30 tablet 3  . lisinopril (PRINIVIL,ZESTRIL) 5 MG tablet Take by mouth.    Marland Kitchen LORazepam (ATIVAN) 0.5 MG tablet Take 1 tablet (0.5 mg total) by mouth 3 (three) times daily. 90 tablet 3  . lurasidone (LATUDA) 40 MG TABS tablet Take 1 tablet (40 mg total) by mouth daily with breakfast. 30 tablet 3  . metoprolol succinate (TOPROL-XL) 50 MG 24 hr tablet Take by mouth.    . naproxen (NAPROSYN) 500 MG tablet Take by mouth.    . QUEtiapine (SEROQUEL) 300 MG tablet Take 300 mg by mouth at bedtime.    Marland Kitchen QUEtiapine (SEROQUEL) 50 MG tablet Take 1 tablet (50 mg total) by mouth 3 (three) times daily. 90 tablet 3  . sertraline (ZOLOFT) 100 MG tablet Take two tablets (254m) by mouth once daily 60 tablet 3  . simvastatin (ZOCOR) 20 MG tablet Take 20 mg by mouth daily.    . sitaGLIPtin (JANUVIA) 100 MG tablet Take 100 mg by mouth daily.    . solifenacin (VESICARE) 5 MG tablet Take 5 mg by mouth daily.    . tamsulosin (FLOMAX) 0.4 MG CAPS capsule Take 0.4 mg by mouth.    . loratadine (CLARITIN) 10 MG tablet Take 10 mg by mouth daily.    . polyethylene glycol powder (MIRALAX) powder Take 1 Container by mouth once.      Musculoskeletal: Strength & Muscle Tone: decreased Gait &  Station: normal Patient leans: N/A  Psychiatric Specialty Exam: Review of Systems  Constitutional: Negative.   HENT: Negative.   Eyes: Negative.   Respiratory: Negative.   Cardiovascular: Negative.   Gastrointestinal: Negative.   Musculoskeletal: Negative.   Skin: Negative.   Neurological: Negative.   Psychiatric/Behavioral: Positive for suicidal ideas and memory loss. Negative for depression, hallucinations and substance abuse. The patient is nervous/anxious and has insomnia.     Blood pressure 106/80, pulse 93, temperature 99.1  F (37.3 C), temperature source Oral, resp. rate 20, height 5' 9"  (1.753 m), weight 97.523 kg (215 lb), SpO2 97 %.Body mass index is 31.74 kg/(m^2).  General Appearance: Casual  Eye Contact::  Good  Speech:  Garbled  Volume:  Increased  Mood:  Anxious and Irritable  Affect:  Labile  Thought Process:  Loose  Orientation:  Full (Time, Place, and Person)  Thought Content:  Paranoid Ideation  Suicidal Thoughts:  No  Homicidal Thoughts:  No  Memory:  Immediate;   Good Recent;   Fair Remote;   Fair  Judgement:  Impaired  Insight:  Shallow  Psychomotor Activity:  Increased  Concentration:  Poor  Recall:  [employer] of Knowledge:Fair  Language: Fair  Akathisia:  No  Handed:  Right  AIMS (if indicated):     Assets:  Communication Skills Desire for Improvement Financial Resources/Insurance Housing Intimacy Resilience Social Support  ADL's:  Intact  Cognition: Impaired,  Mild  Sleep:      Treatment Plan Summary: Daily contact with patient to assess and evaluate symptoms and progress in treatment, Medication management and Plan Although Mr. Bonelli is not suicidal I think he needs hospitalization. I am very familiar with this situation and if his group home is unable to handle him it is clearly because he is decompensating. If we can stabilize this emotional decompensation hopefully he can get back into the safe situation he has been in but he has  poor judgment about self-care and will continue to run off for get more agitated until he gets himself hurt. Patient will be admitted to the psychiatric ward. Continue all current medicines as prescribed. Supportive counseling to patient. Ordered a hemoglobin A1c and lipid panel as well as point-of-care blood sugar monitoring.  Disposition: Recommend psychiatric Inpatient admission when medically cleared. Supportive therapy provided about ongoing stressors.  John [name] 04/10/2015 2:43 PM

## 2015-04-10 NOTE — ED Notes (Signed)
Spoke with group home ([facility] [phone]); sending FL2 and MAR; reports pt is his own legal guardian and brother is medical and financial POA.

## 2015-04-10 NOTE — ED Notes (Signed)
Brought in via [place] [facility] for beh eval. States he is not happy at is group home ..states he doesn't want to live

## 2015-04-11 ENCOUNTER — Encounter: Payer: Self-pay | Admitting: Psychiatry

## 2015-04-11 LAB — HEMOGLOBIN A1C: Hgb A1c MFr Bld: 5.6 % (ref 4.0–6.0)

## 2015-04-11 LAB — GLUCOSE, CAPILLARY
GLUCOSE-CAPILLARY: 154 mg/dL — AB (ref 65–99)
GLUCOSE-CAPILLARY: 89 mg/dL (ref 65–99)
GLUCOSE-CAPILLARY: 101 mg/dL — AB (ref 65–99)
Glucose-Capillary: 95 mg/dL (ref 65–99)

## 2015-04-11 MED ORDER — LURASIDONE HCL 40 MG PO TABS
60.0000 mg | ORAL_TABLET | Freq: Every day | ORAL | Status: DC
  Administered 2015-04-12 – 2015-04-14 (×3): 60 mg via ORAL
  Filled 2015-04-11 (×3): qty 2

## 2015-04-11 NOTE — [facility] Suicide Risk Assessment (Signed)
[facility] Admission Suicide Risk Assessment   Nursing information obtained from:   Chart Demographic factors:   Single, Caucasian male. Never married and no children Current Mental Status:   Depressed Loss Factors:   Unable to walk independently in the community Historical Factors:   Multiple prior inpatient psychiatric hospitalization  Risk Reduction Factors:   Improve Coping Skills Total Time spent with patient: 1 hour Principal Problem: Depression Diagnosis:   Patient Active Problem List   Diagnosis Date Noted  . Diabetes [E11.9] 04/10/2015  . Hypertension [I10] 04/10/2015  . Prostate hypertrophy [N40.0] 04/10/2015  . Schizoaffective disorder [F25.9] 04/10/2015  . 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 Schizoaffective disorder, bipolar type [F25.0] 12/29/2014  . 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 Anxiety state [F41.1] 12/29/2014  . Schizo-affective psychosis [F25.9] 12/29/2014     Continued Clinical Symptoms:  Alcohol Use Disorder Identification Test Final Score (AUDIT): 0 The "Alcohol Use Disorders Identification Test", Guidelines for Use in Primary Care, Second Edition.  World Pharmacologist [facility]). Score between 0-7:  no or low risk or alcohol related problems. Score between 8-15:  moderate risk of alcohol related problems. Score between 16-19:  high risk of alcohol related problems. Score 20 or above:  warrants further diagnostic evaluation for alcohol dependence and treatment.   CLINICAL FACTORS:  Schizoaffective Disorder     Musculoskeletal: Strength & Muscle Tone: within normal limits Gait & Station: normal Patient leans: N/A  Psychiatric Specialty Exam: Physical Exam  Constitutional: He is oriented to person, place, and time. He appears well-developed and well-nourished.  Obese  HENT:  Head: Normocephalic and atraumatic.  Eyes: EOM are normal. Pupils are equal, round, and  reactive to light.  Neck: Normal range of motion. Neck supple.  Cardiovascular: Normal rate, regular rhythm and normal heart sounds.   Respiratory: Effort normal and breath sounds normal. No respiratory distress. He exhibits no tenderness.  GI: Soft. Bowel sounds are normal. He exhibits no distension. There is no tenderness. There is no rebound.  Musculoskeletal: Normal range of motion. He exhibits no edema or tenderness.  Neurological: He is alert and oriented to person, place, and time. He has normal reflexes. No cranial nerve deficit.  Vascular insufficiency to right lower extremity    Review of Systems  Constitutional: Negative.  Negative for fever, chills, weight loss and malaise/fatigue.  HENT: Negative.  Negative for congestion, hearing loss and tinnitus.   Eyes: Negative.  Negative for blurred vision, double vision, photophobia and pain.  Respiratory: Negative.  Negative for cough, shortness of breath and wheezing.   Cardiovascular: Negative.  Negative for chest pain, palpitations and orthopnea.  Gastrointestinal: Negative.  Negative for heartburn, nausea, vomiting, abdominal pain, diarrhea and constipation.  Musculoskeletal: Negative.  Negative for myalgias, back pain, joint pain and neck pain.  Skin: Negative.  Negative for itching and rash.  Neurological: Negative.  Negative for dizziness, tingling, tremors, sensory change, focal weakness and headaches.    Blood pressure 106/73, pulse 159, temperature 99.1 F (37.3 C), temperature source Oral, resp. rate 18, height 5\' 4"  (1.626 m), weight 96.163 kg (212 lb), SpO2 97 %.Body mass index is 36.37 kg/(m^2).  General Appearance: Casual  Eye Contact::  Fair  Speech:  Hyperverbal  Volume:  Increased  Mood:  Depressed  Affect:  Labile  Thought Process:  Tangential  Orientation:  Full (Time, Place, and Person)  Thought Content:  Negative  Suicidal Thoughts:  Yes.  without intent/plan  Homicidal Thoughts:  No  Memory:  Immediate;    Fair Recent;   Fair Remote;   Fair  Judgement:  Poor  Insight:  Shallow  Psychomotor Activity:  Increased  Concentration:  Fair  Recall:  [place]: Fair  Akathisia:  No  Handed:  Right  AIMS (if indicated):     Assets:  Housing Transportation  Sleep:  Number of Hours: 4.75  Cognition: WNL  ADL's:  Intact     COGNITIVE FEATURES THAT CONTRIBUTE TO RISK:  None    SUICIDE RISK:   Mild:  Suicidal ideation of limited frequency, intensity, duration, and specificity.  There are no identifiable plans, no associated intent, mild dysphoria and related symptoms, good self-control (both objective and subjective assessment), few other risk factors, and identifiable protective factors, including available and accessible social support.  PLAN OF CARE:   Diagnosis Schizoaffective disorder, bipolar type Hypertension Hyperlipidemia Diabetes Severe: Chronic mental illness and poor coping skills   Alexander Beard is a 59 year old single Caucasian male with history of schizoaffective disorder bipolar type who was brought to the hospital secondary to emotionally labile behavior including increased agitation. He is currently having problems at the group home that he is living at and is unable to abide by the restrictions of having to not leave the group home without supervision.   Schizoaffective disorder, bipolar type: Will plan to increase Latuda to a total of 60 mg by mouth daily with breakfast for additional mood stabilization but continue on other psychotropic medications including Depakote 2000 mg by mouth nightly, Lamictal 200 mg by mouth daily, Neurontin 100 mg three times a day and Seroquel 50 mg by mouth 3 times a day and 300 mg by mouth nightly. The patient also had Zoloft 200 mg by mouth daily for depression. Will also continue Ativan 0.5 mg by mouth 3 times a day for anxiety. Total cholesterol level was 153 and hemoglobin A1c was 5.3.  Hypertension: We'll plan  to continue Toprol-XL 50 mg by mouth daily, lisinopril 5 mg by mouth daily and Lasix 20 mg by mouth daily. Vital signs are stable.  Diabetes: Hemoglobin A1c was 5.3. We'll continue diabetic diet. We'll continue Lantus 15 mg by mouth nightly and Tradjenta 5 mg by mouth daily.  Hyperlipidemia: Total cholesterol was 153. We'll continue simvastatin 20 mg by mouth daily  BPH: Will continue Flomax 0.4 mg by mouth nightly  Overactive bladder: Will plan to continue Enablex 7.5 mg by mouth daily  Disposition: To be determined. The patient will need to follow up with a psychiatrist for psychotropic medication management after discharge. He would benefit from a day care program.    Medical Decision Making:  Established Problem, Stable/Improving (1), Review of Psycho-Social Stressors (1), Order AIMS Test (2) and Review of Medication Regimen & Side Effects (2)  I certify that inpatient services furnished can reasonably be expected to improve the patient's condition.   KAPUR,AARTI KAMAL 04/11/2015, 12:35 PM

## 2015-04-11 NOTE — Progress Notes (Signed)
D: Pt denies SI/HI/AVH. Pt's affect is flat and sad. Patient upset and tearful at times, however, he is redirectable.  Patient is medication complaint at this time.  He ruminates of his living situation, patient was reassured by MD that he would not go back to previous home.  Patient interacts appropriately with peers and staff.    A: Pt was offered support and encouragement. Pt was given scheduled medications. Pt was encouraged to attend groups. Q 15 minute checks were done for safety.  R:Pt attends groups and interacts with peers and staff appropriately. Pt is compliant with medication. Pt receptive to treatment and safety maintained on the unit.

## 2015-04-11 NOTE — Plan of Care (Signed)
Problem: Alteration in mood; excessive anxiety as evidenced by: Goal: LTG-Patient's behavior demonstrates decreased anxiety (Patient's behavior demonstrates anxiety and he/she is utilizing learned coping skills to deal with anxiety-producing situations)  Outcome: Not Progressing Patient was very anxious, apprehension and tearful during the shift.

## 2015-04-11 NOTE — [facility] Group Notes (Signed)
[facility] Group Therapy  04/11/2015 4:22 PM  Type of Therapy:  Group Therapy  Participation Level:  Active  Participation Quality:  Appropriate  Affect:  Depressed  Cognitive:  Alert and Disorganized  Insight:  Developing/Improving and Limited  Engagement in Therapy:  Engaged  Modes of Intervention:  Activity, Discussion, Exploration and Reality Testing  Summary of Progress/Problems: Pt was engaged in group therapy. He shared openly with the group, sometimes on topic and other times he had to be reoriented to the topic on hand. Pt appreciated the supportive feedback from his fellow group members. Pt states that he needs a new group home and is not happy in his current situation. Pt identified his brother and the police officer who brought him in as supportive.   Alonna Buckler 04/11/2015, 4:22 PM

## 2015-04-11 NOTE — Progress Notes (Signed)
D: Pt denies SI/HI/AVH. Pt's affect is flat and dry. Patient is tearful, upset about his current group home and expressed to staff that he does not  want to go back there because he feels isolated and not treated well. Patient  appears  anxious, apprehensive and emotional about the group home situation. Patient is interacting with peers and staff appropriately.  A: Pt was offered support and encouragement. Pt was given scheduled medications. Pt was encouraged to attend groups. Q 15 minute checks were done for safety.  R:Pt attends groups and interacts with peers and staff appropriately. Pt is compliant with medication. Pt receptive to treatment and safety maintained on unit.

## 2015-04-11 NOTE — [facility] Group Notes (Signed)
[facility] Group Notes:  (Nursing/MHT/Case Management/Adjunct)  Date:  04/11/2015  Time:  3:44 AM  Type of Therapy:  Group Therapy  Participation Level:  Active  Participation Quality:  Redirectable  Affect:  Tearful  Cognitive:  Alert  Insight:  Limited  Engagement in Group:  Developing/Improving  Modes of Intervention:  Problem-solving  Summary of Progress/Problems:PT was very tearful when talking about his unhappiness at his group home; and his disappointment that he would be missing the "[place]" while on the unit. Staff had to redirect PT from going into crying fits numerous times. PT says if he is to return to that group home. Things will only get worse.   Jenetta Downer Texie Tupou 04/11/2015, 3:44 AM

## 2015-04-11 NOTE — Progress Notes (Signed)
Patient with sad affect, slightly worried and nervous during interactions. Able to verbalize needs appropriately to staff and pleased when request is met. Noted to sit in dayroom, watch tv, talk with peers and color this evening. Meds as ordered. Blood glucose monitored and recorded with no s/s of hypo/hyperglycemia. No SI/HI at this time. Does well with support and encouragement. Reminded to toilet prior to going to bed. Safety maintained.

## 2015-04-11 NOTE — Plan of Care (Signed)
Problem: Alteration in mood; excessive anxiety as evidenced by: Goal: STG-Pt will report an absence of self-harm thoughts/actions (Patient will report an absence of self-harm thoughts or actions)  Outcome: Progressing Patient with no SI/HI at this time. Patient was able to sit in dayroom with peers, watch tv, talk and color this evening. Verbalized needs appropriately and pleased when request fullfilled.

## 2015-04-11 NOTE — H&P (Signed)
Psychiatric Admission Assessment Adult  Patient Identification: Alexander Beard MRN:  [id number] Date of Evaluation:  04/11/2015 Chief Complaint:  schizophrenia Principal Diagnosis: Schizoaffective Disorder, Bipolar Type   Diagnosis:   Patient Active Problem List   Diagnosis Date Noted  . Diabetes [E11.9] 04/10/2015  . Hypertension [I10] 04/10/2015  . Prostate hypertrophy [N40.0] 04/10/2015  . Schizoaffective disorder [F25.9] 04/10/2015  . 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 Schizoaffective disorder, bipolar type [F25.0] 12/29/2014  . 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 Anxiety state [F41.1] 12/29/2014  . Schizo-affective psychosis [F25.9] 12/29/2014     Total Time spent with patient: 1 hour   History of Present Illness: Alexander Beard is a 59 year old single Caucasian male with a history of schizoaffective disorder, bipolar type and some developmental delay, possible autism who was brought to the emergency room by police secondary to behavioral problems at the group home. Per the group home, the patient has been decompensating and acting out with agitated behavior the past one week. He then ran away from the group home and police had to be called to bring him back to the group home. The patient is very upset that he is not allowed to walk around town about any restrictions which he did do frequently in the past. He is very unhappy living there and is reporting passive suicidal thoughts secondary to these restrictions prohibiting him from walking in the community. He denies any current active suicidal thoughts but does appear to have very poor coping skills and difficulty with emotional lability and anger. He denies any homicidal thoughts. He denies any current auditory or visual hallucinations. He denies any paranoid thoughts or delusions. Mood is been depressed and the patient has had frequent crying spells.  He is also reporting difficulty with focus and concentration. He says he does not also get along with other residents at the group home. The patient wishes to be more independent in the group home will allow him. He denies any history of any heavy alcohol use or illicit drug use and has been compliant with psychotropic medications at the group home. There is no history of any prior suicide attempts. The patient currently has a legal guardian, his brother   Past psychiatric history  The patient has had multiple prior inpatient psychiatric hospitalizations for schizoaffective disorder but denies any prior suicide attempts. He is currently followed by Dr. Carlena Hurl packs for outpatient psychotropic medication management.   Family psychiatric history The patient denies any history of any mental illness or substance use in the family   Past Medical History  Diabetes, insulin-dependent Hypertension Hyperlipidemia BPH Overactive bladder He denies any history of any prior TBI or seizures    Substance abuse history:  The patient denies any history of any heavy alcohol use or illicit drug use in the past.   Social History:  The patient never finished high school but did get his GED. 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 He is currently in disability and [name] is his legal guardian. He has been living in group homes for many years.   Risk to Self: Is patient at risk for suicide?: No Risk to Others:   Prior Inpatient Therapy:   Prior Outpatient Therapy:    Alcohol Screening: Patient refused Alcohol Screening Tool: Yes 1. How often do you have a drink containing alcohol?: Never 2. How  many drinks containing alcohol do you have on a typical day when you are drinking?: 1 or 2 3. How often do you have six or more drinks on one occasion?: Never Preliminary Score: 0 9. Have you or someone else been injured as a result  of your drinking?: No 10. Has a relative or friend or a doctor or another health worker been concerned about your drinking or suggested you cut down?: No Alcohol Use Disorder Identification Test Final Score (AUDIT): 0 Brief Intervention: AUDIT score less than 7 or less-screening does not suggest unhealthy drinking-brief intervention not indicated   Substance Abuse History in the last 12 months:  No. Consequences of Substance Abuse: Negative Previous Psychotropic Medications: Yes  Psychological Evaluations: Yes  Past Medical History:  Past Medical History  Diagnosis Date  . Schizophrenia, schizoaffective ([place])   . Autistic disorder, residual state   . Anxiety disorder due to known physiological condition   . Anxiety   . Esophageal reflux   . Diabetes mellitus without complication ([place])   . Hypertension   . Developmental disorder   . Arthritis   . Dyslipidemia   . Sleep apnea   . Overactive bladder   . Urinary incontinence    History reviewed. No pertinent past surgical history. Family History:  Family History  Problem Relation Age of Onset  . Family history unknown: Yes    Social History:  History  Alcohol Use No     History  Drug Use No    Social History   Social History  . Marital Status: Single    Spouse Name: N/A  . Number of Children: N/A  . Years of Education: N/A   Social History Main Topics  . Smoking status: Never Smoker   . Smokeless tobacco: Never Used  . Alcohol Use: No  . Drug Use: No  . Sexual Activity: No   Other Topics Concern  . None   Social History Narrative   The patient never finished high school but did get his GED. 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 He is currently in disability and [name] is his legal guardian. He has been living in group homes for many years.      No pending legal charges   Additional Social History:                          Allergies:   Allergies  Allergen Reactions  . Other Other (See Comments)    MYACINS  . Penicillins Nausea And Vomiting   Lab Results:  Results for orders placed or performed during the hospital encounter of 04/10/15 (from the past 48 hour(s))  Hemoglobin A1c     Status: None   Collection Time: 04/10/15  7:06 PM  Result Value Ref Range   Hgb A1c MFr Bld 5.6 4.0 - 6.0 %  Lipid panel, fasting     Status: Abnormal   Collection Time: 04/10/15  7:06 PM  Result Value Ref Range   Cholesterol 153 0 - 200 mg/dL   Triglycerides 166 (H) <150 mg/dL   HDL 50 >40 mg/dL   Total CHOL/HDL Ratio 3.1 RATIO   VLDL 33 0 - 40 mg/dL   LDL Cholesterol 70 0 - 99 mg/dL    Comment:        Total Cholesterol/HDL:CHD Risk Coronary Heart Disease Risk Table  Men   Women  1/2 Average Risk   3.4   3.3  Average Risk       5.0   4.4  2 X Average Risk   9.6   7.1  3 X Average Risk  23.4   11.0        Use the calculated Patient Ratio above and the CHD Risk Table to determine the patient's CHD Risk.        ATP III CLASSIFICATION (LDL):  <100     mg/dL   Optimal  100-129  mg/dL   Near or Above                    Optimal  130-159  mg/dL   Borderline  160-189  mg/dL   High  >190     mg/dL   Very High   TSH     Status: None   Collection Time: 04/10/15  7:06 PM  Result Value Ref Range   TSH 2.776 0.350 - 4.500 uIU/mL  Glucose, capillary     Status: Abnormal   Collection Time: 04/10/15  9:08 PM  Result Value Ref Range   Glucose-Capillary 154 (H) 65 - 99 mg/dL   Comment 1 Notify RN    Comment 2 Document in Chart   Glucose, capillary     Status: None   Collection Time: 04/11/15  7:56 AM  Result Value Ref Range   Glucose-Capillary 89 65 - 99 mg/dL  Glucose, capillary     Status: Abnormal   Collection Time: 04/11/15 11:41 AM  Result Value Ref Range   Glucose-Capillary 101 (H) 65 - 99 mg/dL   Comment 1 Notify RN    Comment 2 Document in Chart     Metabolic Disorder Labs:  Lab  Results  Component Value Date   HGBA1C 5.6 04/10/2015   No results found for: PROLACTIN Lab Results  Component Value Date   CHOL 153 04/10/2015   TRIG 166* 04/10/2015   HDL 50 04/10/2015   CHOLHDL 3.1 04/10/2015   VLDL 33 04/10/2015   LDLCALC 70 04/10/2015    Current Medications: Current Facility-Administered Medications  Medication Dose Route Frequency Provider Last Rate Last Dose  . acetaminophen (TYLENOL) tablet 650 mg  650 mg Oral Q6H PRN Gonzella Lex, MD      . alum & mag hydroxide-simeth (MAALOX/MYLANTA) 200-200-20 MG/5ML suspension 30 mL  30 mL Oral Q4H PRN Gonzella Lex, MD      . cholecalciferol (VITAMIN D) tablet 1,000 Units  1,000 Units Oral Daily Gonzella Lex, MD   1,000 Units at 04/11/15 1043  . darifenacin (ENABLEX) 24 hr tablet 7.5 mg  7.5 mg Oral Daily Gonzella Lex, MD   7.5 mg at 04/11/15 1044  . divalproex (DEPAKOTE ER) 24 hr tablet 2,000 mg  2,000 mg Oral QHS Gonzella Lex, MD   2,000 mg at 04/10/15 2241  . furosemide (LASIX) tablet 20 mg  20 mg Oral Daily Gonzella Lex, MD   20 mg at 04/11/15 1043  . gabapentin (NEURONTIN) capsule 100 mg  100 mg Oral TID Gonzella Lex, MD   100 mg at 04/11/15 1044  . haloperidol (HALDOL) tablet 2 mg  2 mg Oral Q6H PRN Gonzella Lex, MD      . insulin glargine (LANTUS) injection 15 Units  15 Units Subcutaneous QHS Gonzella Lex, MD   15 Units at 04/10/15 2243  . lamoTRIgine (LAMICTAL) tablet 200 mg  200  mg Oral Daily Gonzella Lex, MD   200 mg at 04/11/15 1044  . linagliptin (TRADJENTA) tablet 5 mg  5 mg Oral Daily Gonzella Lex, MD   5 mg at 04/11/15 1046  . lisinopril (PRINIVIL,ZESTRIL) tablet 5 mg  5 mg Oral Daily Gonzella Lex, MD   5 mg at 04/11/15 1044  . loratadine (CLARITIN) tablet 10 mg  10 mg Oral Daily Gonzella Lex, MD   10 mg at 04/11/15 1044  . LORazepam (ATIVAN) tablet 0.5 mg  0.5 mg Oral TID Gonzella Lex, MD   0.5 mg at 04/11/15 1043  . [START ON 04/12/2015] lurasidone (LATUDA) tablet 60 mg  60 mg  Oral Q breakfast Chauncey Mann, MD      . magnesium hydroxide (MILK OF MAGNESIA) suspension 30 mL  30 mL Oral Daily PRN Gonzella Lex, MD      . metoprolol succinate (TOPROL-XL) 24 hr tablet 50 mg  50 mg Oral Daily Gonzella Lex, MD   50 mg at 04/11/15 1046  . polyethylene glycol (MIRALAX / GLYCOLAX) packet 17 g  17 g Oral Daily Gonzella Lex, MD   17 g at 04/11/15 1102  . QUEtiapine (SEROQUEL) tablet 300 mg  300 mg Oral QHS Gonzella Lex, MD   300 mg at 04/10/15 2228  . QUEtiapine (SEROQUEL) tablet 50 mg  50 mg Oral TID Gonzella Lex, MD   50 mg at 04/11/15 1046  . sertraline (ZOLOFT) tablet 200 mg  200 mg Oral Daily Gonzella Lex, MD   200 mg at 04/11/15 1046  . simvastatin (ZOCOR) tablet 20 mg  20 mg Oral q1800 Gonzella Lex, MD      . tamsulosin [facility]) capsule 0.4 mg  0.4 mg Oral QPC supper Gonzella Lex, MD   0.4 mg at 04/10/15 1854   PTA Medications: Prescriptions prior to admission  Medication Sig Dispense Refill Last Dose  . Cholecalciferol (VITAMIN D-1000 MAX ST) 1000 UNITS tablet Take 5,000 Units by mouth.    04/10/2015 at Unknown time  . divalproex (DEPAKOTE ER) 500 MG 24 hr tablet Take 4 tablets (2,000 mg total) by mouth at bedtime. 120 tablet 3 04/09/2015 at Unknown time  . furosemide (LASIX) 20 MG tablet Take by mouth.   04/10/2015 at Unknown time  . gabapentin (NEURONTIN) 100 MG capsule Take 1 capsule (100 mg total) by mouth 3 (three) times daily. 90 capsule 3 04/10/2015 at Unknown time  . haloperidol (HALDOL) 2 MG tablet Take 1 tablet (2 mg total) by mouth every 4 (four) hours as needed for agitation. 120 tablet 3 04/10/2015 at Unknown time  . insulin glargine (LANTUS) 100 UNIT/ML injection Inject 15 Units into the skin at bedtime. Hold is CBG below 100.   04/09/2015 at Unknown time  . lamoTRIgine (LAMICTAL) 200 MG tablet Take 1 tablet (200 mg total) by mouth at bedtime. 30 tablet 3 04/09/2015 at Unknown time  . lisinopril (PRINIVIL,ZESTRIL) 5 MG tablet Take by mouth.    04/10/2015 at Unknown time  . loratadine (CLARITIN) 10 MG tablet Take 10 mg by mouth daily.   Taking  . LORazepam (ATIVAN) 0.5 MG tablet Take 1 tablet (0.5 mg total) by mouth 3 (three) times daily. 90 tablet 3 04/10/2015 at Unknown time  . lurasidone (LATUDA) 40 MG TABS tablet Take 1 tablet (40 mg total) by mouth daily with breakfast. 30 tablet 3 04/10/2015 at Unknown time  . metoprolol succinate (TOPROL-XL) 50 MG 24  hr tablet Take by mouth.   04/10/2015 at Unknown time  . naproxen (NAPROSYN) 500 MG tablet Take by mouth.   Past Month at Unknown time  . polyethylene glycol powder (MIRALAX) powder Take 1 Container by mouth once.   Taking  . QUEtiapine (SEROQUEL) 300 MG tablet Take 300 mg by mouth at bedtime.   04/09/2015 at Unknown time  . QUEtiapine (SEROQUEL) 50 MG tablet Take 1 tablet (50 mg total) by mouth 3 (three) times daily. 90 tablet 3 04/10/2015 at Unknown time  . sertraline (ZOLOFT) 100 MG tablet Take two tablets (200mg ) by mouth once daily 60 tablet 3 04/10/2015 at Unknown time  . simvastatin (ZOCOR) 20 MG tablet Take 20 mg by mouth daily.   04/09/2015 at Unknown time  . sitaGLIPtin (JANUVIA) 100 MG tablet Take 100 mg by mouth daily.   04/10/2015 at Unknown time  . solifenacin (VESICARE) 5 MG tablet Take 5 mg by mouth daily.   04/10/2015 at Unknown time  . tamsulosin (FLOMAX) 0.4 MG CAPS capsule Take 0.4 mg by mouth.   04/10/2015 at Unknown time    Musculoskeletal: Strength & Muscle Tone: within normal limits Gait & Station: normal Patient leans: N/A  Psychiatric Specialty Exam: Physical Exam  Constitutional: He is oriented to person, place, and time. He appears well-developed and well-nourished. No distress.  Obese  HENT:  Head: Normocephalic and atraumatic.  Right Ear: External ear normal.  Left Ear: External ear normal.  Wears glasses as vision is bad  Eyes: EOM are normal. Pupils are equal, round, and reactive to light. Left eye exhibits no discharge. No scleral icterus.  Neck:  Normal range of motion. Neck supple. No tracheal deviation present. No thyromegaly present.  Cardiovascular: Normal rate, regular rhythm, normal heart sounds and intact distal pulses.   No murmur heard. Respiratory: Effort normal and breath sounds normal. No respiratory distress. He has no wheezes. He exhibits no tenderness.  GI: Soft. Bowel sounds are normal. He exhibits no distension. There is no tenderness. There is no rebound and no guarding.  Musculoskeletal: Normal range of motion. He exhibits no edema or tenderness.  Lymphadenopathy:    He has no cervical adenopathy.  Neurological: He is alert and oriented to person, place, and time. No cranial nerve deficit. Coordination normal.  Skin: Skin is warm and dry. He is not diaphoretic.  Venous insufficiency in right lower extremity    Review of Systems  Constitutional: Negative.  Negative for fever, chills, weight loss, malaise/fatigue and diaphoresis.  HENT: Negative.  Negative for congestion, ear pain, hearing loss and tinnitus.   Eyes: Negative.  Negative for blurred vision, double vision, photophobia and discharge.  Respiratory: Negative.  Negative for cough, hemoptysis, shortness of breath and wheezing.   Cardiovascular: Negative.  Negative for chest pain, palpitations, orthopnea and leg swelling.  Gastrointestinal: Negative.  Negative for heartburn, nausea, vomiting, abdominal pain, diarrhea and constipation.  Genitourinary: Negative.  Negative for dysuria and urgency.  Musculoskeletal: Negative.  Negative for myalgias, back pain, joint pain and neck pain.  Skin: Negative.  Negative for itching and rash.  Neurological: Negative.  Negative for dizziness, tingling, tremors, focal weakness, seizures and headaches.  Endo/Heme/Allergies: Negative.  Does not bruise/bleed easily.    Blood pressure 106/73, pulse 159, temperature 99.1 F (37.3 C), temperature source Oral, resp. rate 18, height 5\' 4"  (1.626 m), weight 96.163 kg (212 lb), SpO2  97 %.Body mass index is 36.37 kg/(m^2).  General Appearance: Casual  Eye Contact::  Good  Speech:  Pressured  Volume:  Increased  Mood:  Depressed  Affect:  Labile  Thought Process:  Tangential  Orientation:  Full (Time, Place, and Person)  Thought Content:  Negative  Suicidal Thoughts:  Yes.  without intent/plan  Homicidal Thoughts:  No  Memory:  Immediate;   Fair Recent;   Fair Remote;   Fair  Judgement:  Poor  Insight:  Shallow  Psychomotor Activity:  Increased  Concentration:  Fair  Recall:  [employer] of Knowledge:Fair  Language: Fair  Akathisia:  Negative  Handed:  Right  AIMS (if indicated):     Assets:  Housing Social Support Transportation  ADL's:  Intact  Cognition: WNL  Sleep:  Number of Hours: 4.75     Treatment Plan Summary:  Diagnosis Schizoaffective disorder, bipolar type Hypertension Hyperlipidemia Diabetes Severe: Chronic mental illness and poor coping skills   Mr. Kentner is a 59 year old single Caucasian male with history of schizoaffective disorder bipolar type who was brought to the hospital secondary to emotionally labile behavior including increased agitation. He is currently having problems at the group home that he is living at and is unable to abide by the restrictions of having to not leave the group home without supervision.   Schizoaffective disorder, bipolar type: Will plan to increase Latuda to a total of 60 mg by mouth daily with breakfast for additional mood stabilization but continue on other psychotropic medications including Depakote 2000 mg by mouth nightly, Lamictal 200 mg by mouth daily, Neurontin 100 mg three times a day and Seroquel 50 mg by mouth 3 times a day and 300 mg by mouth nightly. The patient also had Zoloft 200 mg by mouth daily for depression. Will also continue Ativan 0.5 mg by mouth 3 times a day for anxiety. Total cholesterol level was 153 and hemoglobin A1c was 5.3.  Hypertension: We'll plan to continue Toprol-XL  50 mg by mouth daily, lisinopril 5 mg by mouth daily and Lasix 20 mg by mouth daily. Vital signs are stable.  Diabetes: Hemoglobin A1c was 5.3. We'll continue diabetic diet. We'll continue Lantus 15 mg by mouth nightly and Tradjenta 5 mg by mouth daily.  Hyperlipidemia: Total cholesterol was 153. We'll continue simvastatin 20 mg by mouth daily  BPH: Will continue Flomax 0.4 mg by mouth nightly  Overactive bladder: Will plan to continue Enablex 7.5 mg by mouth daily  Disposition: To be determined. The patient will need to follow up with a psychiatrist for psychotropic medication management after discharge. He would benefit from a day care program.    Daily contact with patient to assess and evaluate symptoms and progress in treatment and Medication management     Observation Level/Precautions:  15 minute checks  Laboratory:  HbAIC  Psychotherapy:    Medications:    Consultations:    Discharge Concerns:    Estimated LOS:  Other:     I certify that inpatient services furnished can reasonably be expected to improve the patient's condition.   KAPUR,AARTI KAMAL 10/1/201612:49 PM

## 2015-04-12 LAB — GLUCOSE, CAPILLARY
GLUCOSE-CAPILLARY: 83 mg/dL (ref 65–99)
GLUCOSE-CAPILLARY: 268 mg/dL — AB (ref 65–99)
Glucose-Capillary: 305 mg/dL — ABNORMAL HIGH (ref 65–99)
Glucose-Capillary: 87 mg/dL (ref 65–99)

## 2015-04-12 MED ORDER — HALOPERIDOL LACTATE 5 MG/ML IJ SOLN
INTRAMUSCULAR | Status: AC
  Administered 2015-04-12: 5 mg via INTRAMUSCULAR
  Filled 2015-04-12: qty 1

## 2015-04-12 NOTE — Progress Notes (Signed)
Pt remains pleasant and cooperative this period. No more acting out or inappropriate behaviors noted.

## 2015-04-12 NOTE — Progress Notes (Signed)
Pt became agitated after talking with Dr Nicolasa Ducking. Order obtained for haldol 5mg s IM which was given. Medication was effective , pt later calm and was able to effectively communicate. Pt later apologized for his bad behavior. From then on, pt was pleasant and cooperative and able to attend unit activities .

## 2015-04-12 NOTE — Progress Notes (Signed)
[facility] MD Progress Note  04/12/2015 9:43 AM Alexander Beard  MRN:  [id number]   Principal Problem: Schizoaffective Disorder, Bipolar Type  Subjective:    The patient did have one anger outbursts yesterday but then remained calm throughout the afternoon. This morning, he was initially calm but then any conversation about the group home created agitation. The patient continues to demand that he not return to the group home. He started getting agitated and punching the door. The patient says that he has to return to the group home, he will run away from it. After he got agitated this morning, he was given Haldol 5 mg IM. Yesterday, the patient did go to some groups except for AA/NA. He has been sitting in the day room and was interacting in the afternoon yesterday. He denies any current active or passive suicidal thoughts or homicidal thoughts. He denies any auditory or visual hallucinations. He denies any paranoid thoughts or delusions. The patient says his brother came to visit yesterday and gave him information about exploration dinosaurs, 2 of his interests. Appetite is good and blood sugars are controlled. The patient says he slept well although nursing reported 4.75 hours. He denies any physical adverse side effects associated with the medications and tolerated the increase in Latuda this morning.   Past psychiatric history  The patient has had multiple prior inpatient psychiatric hospitalizations for schizoaffective disorder but denies any prior suicide attempts. He is currently followed by Dr. Carlena Hurl packs for outpatient psychotropic medication management.   Family psychiatric history The patient denies any history of any mental illness or substance use in the family   Past Medical History  Diabetes, insulin-dependent Hypertension Hyperlipidemia BPH Overactive bladder He denies any history of any prior TBI or seizures    Substance abuse history:  The patient denies any history of  any heavy alcohol use or illicit drug use in the past.   Social History:  The patient never finished high school but did get his GED. 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 He is currently in disability and [name] is his legal guardian. He has been living in group homes for many years.      Diagnosis:   Patient Active Problem List   Diagnosis Date Noted  . Diabetes ([place]) [E11.9] 04/10/2015  . Hypertension [I10] 04/10/2015  . Prostate hypertrophy [N40.0] 04/10/2015  . Schizoaffective disorder ([place]) [F25.9] 04/10/2015  . 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 Schizoaffective disorder, bipolar type ([place]) [F25.0] 12/29/2014  . 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 Anxiety state [F41.1] 12/29/2014  . Schizo-affective psychosis ([place]) [F25.9] 12/29/2014   Total Time spent with patient: 1 hour    Past Medical History:  Past Medical History  Diagnosis Date  . Schizophrenia, schizoaffective ([place])   . Autistic disorder, residual state   . Anxiety disorder due to known physiological condition   . Anxiety   . Esophageal reflux   . Diabetes mellitus without complication ([place])   . Hypertension   . Developmental disorder   . Arthritis   . Dyslipidemia   . Sleep apnea   . Overactive bladder   . Urinary incontinence    History reviewed. No pertinent past surgical history. Family History:  Family History  Problem Relation Age of Onset  . Family history unknown: Yes   Social History:  History  Alcohol Use  No     History  Drug Use No    Social History   Social History  . Marital Status: Single    Spouse Name: N/A  . Number of Children: N/A  . Years of Education: N/A   Social History Main Topics  . Smoking status: Never Smoker   . Smokeless tobacco: Never Used  . Alcohol Use: No  . Drug Use: No  . Sexual Activity:  No   Other Topics Concern  . None   Social History Narrative   The patient never finished high school but did get his GED. 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 He is currently in disability and [name] is his legal guardian. He has been living in group homes for many years.      No pending legal charges   Additional Social History:                         Sleep: Fair  Appetite:  Good  Current Medications: Current Facility-Administered Medications  Medication Dose Route Frequency Provider Last Rate Last Dose  . acetaminophen (TYLENOL) tablet 650 mg  650 mg Oral Q6H PRN Gonzella Lex, MD      . alum & mag hydroxide-simeth (MAALOX/MYLANTA) 200-200-20 MG/5ML suspension 30 mL  30 mL Oral Q4H PRN Gonzella Lex, MD      . cholecalciferol (VITAMIN D) tablet 1,000 Units  1,000 Units Oral Daily Gonzella Lex, MD   1,000 Units at 04/11/15 1043  . darifenacin (ENABLEX) 24 hr tablet 7.5 mg  7.5 mg Oral Daily Gonzella Lex, MD   7.5 mg at 04/11/15 1044  . divalproex (DEPAKOTE ER) 24 hr tablet 2,000 mg  2,000 mg Oral QHS Gonzella Lex, MD   2,000 mg at 04/11/15 2312  . furosemide (LASIX) tablet 20 mg  20 mg Oral Daily Gonzella Lex, MD   20 mg at 04/11/15 1043  . gabapentin (NEURONTIN) capsule 100 mg  100 mg Oral TID Gonzella Lex, MD   100 mg at 04/11/15 2312  . haloperidol (HALDOL) tablet 2 mg  2 mg Oral Q6H PRN Gonzella Lex, MD      . haloperidol lactate (HALDOL) 5 MG/ML injection           . insulin glargine (LANTUS) injection 15 Units  15 Units Subcutaneous QHS Gonzella Lex, MD   15 Units at 04/11/15 2316  . lamoTRIgine (LAMICTAL) tablet 200 mg  200 mg Oral Daily Gonzella Lex, MD   200 mg at 04/11/15 1044  . linagliptin (TRADJENTA) tablet 5 mg  5 mg Oral Daily Gonzella Lex, MD   5 mg at 04/11/15 1046  . lisinopril (PRINIVIL,ZESTRIL) tablet 5 mg  5 mg Oral Daily Gonzella Lex, MD   5 mg at  04/11/15 1044  . loratadine (CLARITIN) tablet 10 mg  10 mg Oral Daily Gonzella Lex, MD   10 mg at 04/11/15 1044  . LORazepam (ATIVAN) tablet 0.5 mg  0.5 mg Oral TID Gonzella Lex, MD   0.5 mg at 04/11/15 2313  . lurasidone (LATUDA) tablet 60 mg  60 mg Oral Q breakfast Chauncey Mann, MD      . magnesium hydroxide (MILK OF MAGNESIA) suspension 30 mL  30 mL Oral Daily PRN Gonzella Lex, MD      . metoprolol succinate (  TOPROL-XL) 24 hr tablet 50 mg  50 mg Oral Daily Gonzella Lex, MD   50 mg at 04/11/15 1046  . polyethylene glycol (MIRALAX / GLYCOLAX) packet 17 g  17 g Oral Daily Gonzella Lex, MD   17 g at 04/11/15 1102  . QUEtiapine (SEROQUEL) tablet 300 mg  300 mg Oral QHS Gonzella Lex, MD   300 mg at 04/11/15 2314  . QUEtiapine (SEROQUEL) tablet 50 mg  50 mg Oral TID Gonzella Lex, MD   50 mg at 04/11/15 2313  . sertraline (ZOLOFT) tablet 200 mg  200 mg Oral Daily Gonzella Lex, MD   200 mg at 04/11/15 1046  . simvastatin (ZOCOR) tablet 20 mg  20 mg Oral q1800 Gonzella Lex, MD   20 mg at 04/11/15 1733  . tamsulosin (FLOMAX) capsule 0.4 mg  0.4 mg Oral QPC supper Gonzella Lex, MD   0.4 mg at 04/11/15 1733    Lab Results:  Results for orders placed or performed during the hospital encounter of 04/10/15 (from the past 48 hour(s))  Hemoglobin A1c     Status: None   Collection Time: 04/10/15  7:06 PM  Result Value Ref Range   Hgb A1c MFr Bld 5.6 4.0 - 6.0 %  Lipid panel, fasting     Status: Abnormal   Collection Time: 04/10/15  7:06 PM  Result Value Ref Range   Cholesterol 153 0 - 200 mg/dL   Triglycerides 166 (H) <150 mg/dL   HDL 50 >40 mg/dL   Total CHOL/HDL Ratio 3.1 RATIO   VLDL 33 0 - 40 mg/dL   LDL Cholesterol 70 0 - 99 mg/dL    Comment:        Total Cholesterol/HDL:CHD Risk Coronary Heart Disease Risk Table                     Men   Women  1/2 Average Risk   3.4   3.3  Average Risk       5.0   4.4  2 X Average Risk   9.6   7.1  3 X Average Risk  23.4   11.0         Use the calculated Patient Ratio above and the CHD Risk Table to determine the patient's CHD Risk.        ATP III CLASSIFICATION (LDL):  <100     mg/dL   Optimal  100-129  mg/dL   Near or Above                    Optimal  130-159  mg/dL   Borderline  160-189  mg/dL   High  >190     mg/dL   Very High   TSH     Status: None   Collection Time: 04/10/15  7:06 PM  Result Value Ref Range   TSH 2.776 0.350 - 4.500 uIU/mL  Glucose, capillary     Status: Abnormal   Collection Time: 04/10/15  9:08 PM  Result Value Ref Range   Glucose-Capillary 154 (H) 65 - 99 mg/dL   Comment 1 Notify RN    Comment 2 Document in Chart   Glucose, capillary     Status: None   Collection Time: 04/11/15  7:56 AM  Result Value Ref Range   Glucose-Capillary 89 65 - 99 mg/dL  Glucose, capillary     Status: Abnormal   Collection Time: 04/11/15 11:41 AM  Result Value  Ref Range   Glucose-Capillary 101 (H) 65 - 99 mg/dL   Comment 1 Notify RN    Comment 2 Document in Chart   Glucose, capillary     Status: None   Collection Time: 04/11/15  4:43 PM  Result Value Ref Range   Glucose-Capillary 95 65 - 99 mg/dL  Glucose, capillary     Status: None   Collection Time: 04/12/15  6:43 AM  Result Value Ref Range   Glucose-Capillary 83 65 - 99 mg/dL   Comment 1 Notify RN    Comment 2 Document in Chart      Musculoskeletal: Strength & Muscle Tone: within normal limits Gait & Station: normal Patient leans: N/A  Psychiatric Specialty Exam: Review of Systems  Constitutional: Negative.  Negative for fever, chills, weight loss and malaise/fatigue.  HENT: Negative.  Negative for ear discharge, ear pain, hearing loss, nosebleeds and tinnitus.   Eyes: Negative.  Negative for blurred vision, double vision, photophobia and redness.  Respiratory: Negative for cough, hemoptysis, shortness of breath and wheezing.   Cardiovascular: Positive for claudication. Negative for chest pain, palpitations and leg swelling.        Vascular insufficiency in RLE  Gastrointestinal: Negative for heartburn, nausea, vomiting, abdominal pain and diarrhea.  Genitourinary: Negative for dysuria, urgency and frequency.  Musculoskeletal: Negative for myalgias, back pain, joint pain and neck pain.  Skin: Negative.  Negative for itching and rash.  Neurological: Negative for dizziness, tingling, tremors, sensory change, focal weakness, seizures and headaches.  Endo/Heme/Allergies: Negative for environmental allergies. Does not bruise/bleed easily.    Blood pressure 117/84, pulse 87, temperature 98.2 F (36.8 C), temperature source Oral, resp. rate 20, height 5\' 4"  (1.626 m), weight 96.163 kg (212 lb), SpO2 97 %.Body mass index is 36.37 kg/(m^2).  General Appearance: Casual  Eye Contact::  Good  Speech:  Clear and coherent  Volume:  Increased  Mood:  Angry  Affect:  Labile  Thought Process:  Tangential  Orientation:  Full (Time, Place, and Person)  Thought Content:  Negative  Suicidal Thoughts:  No  Homicidal Thoughts:  No  Memory:  Immediate;   Fair Recent;   Fair Remote;   Fair  Judgement:  Poor  Insight:  Lacking  Psychomotor Activity:  Increased  Concentration:  Fair  Recall:  [place] of Knowledge:Fair  Language: Good  Akathisia:  Negative  Handed:  Right  AIMS (if indicated):     Assets:  Housing Social Support Transportation  ADL's:  Intact  Cognition: WNL  Sleep:  Number of Hours: 4.75   Treatment Plan Summary:  Diagnosis Schizoaffective disorder, bipolar type Hypertension Hyperlipidemia Diabetes Severe: Chronic mental illness and poor coping skills   Alexander Beard is a 59 year old single Caucasian male with history of schizoaffective disorder bipolar type who was brought to the hospital secondary to emotionally labile behavior including increased agitation. He is currently having problems at the group home that he is living at and is unable to abide by the restrictions of having to not leave the group  home without supervision.   Schizoaffective disorder, bipolar type:Latuda was increased from 40mg  to 60 mg by mouth daily with breakfast for additional mood stabilization but continue on other psychotropic medications including Depakote 2000 mg by mouth nightly, Lamictal 200 mg by mouth daily, Neurontin 100 mg three times a day and Seroquel 50 mg by mouth 3 times a day and 300 mg by mouth nightly. The patient also had Zoloft 200 mg by mouth daily for  depression. Will also continue Ativan 0.5 mg by mouth 3 times a day for anxiety. Total cholesterol level was 153 and hemoglobin A1c was 5.3.  Hypertension: Will plan to continue Toprol-XL 50 mg by mouth daily, lisinopril 5 mg by mouth daily and Lasix 20 mg by mouth daily. Vital signs are stable.  Diabetes: Hemoglobin A1c was 5.3. We'll continue diabetic diet. We'll continue Lantus 15 mg by mouth nightly and Tradjenta 5 mg by mouth daily.  Hyperlipidemia: Total cholesterol was 153. We'll continue simvastatin 20 mg by mouth daily  BPH: Will continue Flomax 0.4 mg by mouth nightly  Overactive bladder: Will plan to continue Enablex 7.5 mg by mouth daily  Disposition: To be determined. The patient will need to follow up with a psychiatrist for psychotropic medication management after discharge. He would benefit from a day care program.    Daily contact with patient to assess and evaluate symptoms and progress in treatment and Medication management  Alexander Beard 04/12/2015, 9:43 AM

## 2015-04-13 ENCOUNTER — Encounter: Payer: Self-pay | Admitting: Psychiatry

## 2015-04-13 DIAGNOSIS — F250 Schizoaffective disorder, bipolar type: Principal | ICD-10-CM

## 2015-04-13 LAB — GLUCOSE, CAPILLARY
GLUCOSE-CAPILLARY: 60 mg/dL — AB (ref 65–99)
GLUCOSE-CAPILLARY: 83 mg/dL (ref 65–99)
GLUCOSE-CAPILLARY: 81 mg/dL (ref 65–99)
Glucose-Capillary: 115 mg/dL — ABNORMAL HIGH (ref 65–99)
Glucose-Capillary: 87 mg/dL (ref 65–99)
Glucose-Capillary: 80 mg/dL (ref 65–99)

## 2015-04-13 LAB — COMPREHENSIVE METABOLIC PANEL
ALK PHOS: 44 U/L (ref 38–126)
ALT: 7 U/L — AB (ref 17–63)
AST: 21 U/L (ref 15–41)
Albumin: 3.3 g/dL — ABNORMAL LOW (ref 3.5–5.0)
Anion gap: 5 (ref 5–15)
BUN: 18 mg/dL (ref 6–20)
CALCIUM: 8.7 mg/dL — AB (ref 8.9–10.3)
CO2: 33 mmol/L — ABNORMAL HIGH (ref 22–32)
CREATININE: 0.88 mg/dL (ref 0.61–1.24)
Chloride: 103 mmol/L (ref 101–111)
Glucose, Bld: 77 mg/dL (ref 65–99)
Potassium: 4.2 mmol/L (ref 3.5–5.1)
Sodium: 141 mmol/L (ref 135–145)
Total Bilirubin: 0.5 mg/dL (ref 0.3–1.2)
Total Protein: 5.7 g/dL — ABNORMAL LOW (ref 6.5–8.1)

## 2015-04-13 LAB — VALPROIC ACID LEVEL: VALPROIC ACID LVL: 77 ug/mL (ref 50.0–100.0)

## 2015-04-13 NOTE — Progress Notes (Signed)
Inpatient Diabetes Program Recommendations  AACE/ADA: New Consensus Statement on Inpatient Glycemic Control (2015)  Target Ranges:  Prepandial:   less than 140 mg/dL      Peak postprandial:   less than 180 mg/dL (1-2 hours)      Critically ill patients:  140 - 180 mg/dL   Review of Glycemic Control  Results for [name], [name] (MRN [id number]) as of 04/13/2015 08:01  Ref. Range 04/11/2015 11:41 04/11/2015 16:43 04/12/2015 06:43 04/12/2015 12:00 04/12/2015 16:45 04/12/2015 21:25 04/13/2015 07:06 04/13/2015 07:28  Glucose-Capillary Latest Ref Range: 65-99 mg/dL 101 (H) 95 83 305 (H) 87 268 (H) 60 (L) 83    Diabetes history: Type 2 Outpatient Diabetes medications: Januvia 100mg /day, Lantus 15 units qhs Current orders for Inpatient glycemic control: Tradgenta 5mg /day, Lantus 15 units qhs  Inpatient Diabetes Program Recommendations: Recommend decreasing Lantus to 10 units qhs (A1C 5.6% and does not need to be that low as it increases his risk of hypoglycemia) Appears that his agitation causes him to have elevated blood sugars.  Alexander Fitz, RN, BA, MHA, CDE Diabetes Coordinator Inpatient Diabetes Program  [phone] (Team Pager) [phone] ([place]) 04/13/2015 8:04 AM
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
## 2015-04-13 NOTE — Plan of Care (Signed)
Problem: Alteration in mood; excessive anxiety as evidenced by: Goal: STG-Pt will report an absence of self-harm thoughts/actions (Patient will report an absence of self-harm thoughts or actions)  Outcome: Progressing Denies suicidal ideation.
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
## 2015-04-13 NOTE — Progress Notes (Signed)
D: Patient denies SI/HI/AVH.  Patient affect and mood are depressed.  Patient did NOT attend evening group. Patient remained in his room throughout the shift.  No distress noted. A: Support and encouragement offered. Scheduled medications given to pt. Q 15 min checks continued for patient safety. R: Patient receptive. Patient remains safe on the unit.

## 2015-04-13 NOTE — Progress Notes (Signed)
Hypoglycemic Event  CBG: 60  Treatment: 15 GM carbohydrate snack  Symptoms: None  Follow-up CBG: Time:0728 CBG Result: 83 Possible Reasons for Event: Unknown  Comments/MD notified: Dr Nicolasa Ducking - Follow Hypoglycemic protocol    Alexander Beard, [name]  Remember to initiate Hypoglycemia Order Set & complete

## 2015-04-13 NOTE — BHH Group Notes (Signed)
[facility] LCSW Aftercare Discharge Planning Group Note   04/13/2015 3:39 PM  Participation Quality:  Good  Mood/Affect:  Anxious and Appropriate  Depression Rating:    Anxiety Rating:    Thoughts of Suicide:  No Will you contract for safety?   Yes  Current AVH:  No  Plan for Discharge/Comments:  Group home, brother is guardian, follow up with ACTT team  Transportation Means: brother or group home  Supports:  Pharmacist, hospital, Carloyn Jaeger

## 2015-04-13 NOTE — Progress Notes (Signed)
[facility] MD Progress Note  04/13/2015 12:30 PM KEIR VIERNES  MRN:  [id number]   Principal Problem: Schizoaffective Disorder, Bipolar Type  Subjective:  Patient was seen this morning. He was calm, friendly and cooperative. He was sitting in the dayroom having lunch. Significant tremors were noted. Patient states that he has been having very negative thoughts about himself. He cannot control these thoughts. He is states that one of the medications he is taking is helping the thoughts and not to be as a strong but it doesn't make them go away.  Patient then started to talk about things he does in order to aid with his depression. 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 He wonders what happens after they received 2 letters and pictures as he never hears back from them.  Patient denies SI, HI or having auditory or visual hallucinations. Denies major problems with appetite, energy or concentration. Per nursing patient only slept about 5 hours. Over the weekend also Latuda was increased from 40 mg to 60 mg a day.  Today I spoke with Dr. Lissa Hoard packs who is his outpatient psychiatrist. He believes that a lot of of the issues are behavioral in nature. He tells me that this has happened before and the patient usually benefits just from admission for de-escalation.  The patient did have one anger outbursts on 10/1 but then remained calm throughout the afternoon. This morning, he was initially calm but then any conversation about the group home created agitation. The patient continues to demand that he not return to the group home. He started getting agitated and punching the door. The patient says that he has to return to the group home, he will run away from it. After he got agitated this 10/2, he was given Haldol 5 mg IM. Yesterday, the patient did go to some groups except for AA/NA.  Past psychiatric history  The patient has had multiple prior inpatient psychiatric hospitalizations  for schizoaffective disorder but denies any prior suicide attempts. He is currently followed by Dr. Carlena Hurl packs for outpatient psychotropic medication management.   Family psychiatric history The patient denies any history of any mental illness or substance use in the family   Past Medical History  Diabetes, insulin-dependent Hypertension Hyperlipidemia BPH Overactive bladder He denies any history of any prior TBI or seizures    Substance abuse history:  The patient denies any history of any heavy alcohol use or illicit drug use in the past.   Social History:  The patient never finished high school but did get his GED. 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 He is currently in disability and [name] is his legal guardian. He has been living in group homes for many years.   Diagnosis:   Patient Active Problem List   Diagnosis Date Noted  . Diabetes ([place]) [E11.9] 04/10/2015  . Hypertension [I10] 04/10/2015  . Prostate hypertrophy [N40.0] 04/10/2015  . Schizoaffective disorder ([place]) [F25.9] 04/10/2015  . Schizoaffective disorder, bipolar type ([place]) [F25.0] 12/29/2014  . Active autistic disorder [F84.0] 12/29/2014   Total Time spent with patient: 1 hour    Past Medical History:  Past Medical History  Diagnosis Date  . Schizophrenia, schizoaffective ([place])   . Autistic disorder, residual state   . Anxiety disorder due to known physiological condition   . Anxiety   . Esophageal reflux   . Diabetes mellitus without  complication ([place])   . Hypertension   . Developmental disorder   . Arthritis   . Dyslipidemia   . Sleep apnea   . Overactive bladder   . Urinary incontinence    History reviewed. No pertinent past surgical history.  Family History:  Family History  Problem Relation Age of Onset  . Family history unknown: Yes   Social History:  History  Alcohol Use No     History  Drug  Use No    Social History   Social History  . Marital Status: Single    Spouse Name: N/A  . Number of Children: N/A  . Years of Education: N/A   Social History Main Topics  . Smoking status: Never Smoker   . Smokeless tobacco: Never Used  . Alcohol Use: No  . Drug Use: No  . Sexual Activity: No   Other Topics Concern  . None   Social History Narrative   The patient never finished high school but did get his GED. 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 He is currently in disability and [name] is his legal guardian. He has been living in group homes for many years.      No pending legal charges    Sleep: Fair  Appetite:  Good  Current Medications: Current Facility-Administered Medications  Medication Dose Route Frequency Provider Last Rate Last Dose  . acetaminophen (TYLENOL) tablet 650 mg  650 mg Oral Q6H PRN Gonzella Lex, MD      . alum & mag hydroxide-simeth (MAALOX/MYLANTA) 200-200-20 MG/5ML suspension 30 mL  30 mL Oral Q4H PRN Gonzella Lex, MD      . cholecalciferol (VITAMIN D) tablet 1,000 Units  1,000 Units Oral Daily Gonzella Lex, MD   1,000 Units at 04/13/15 0912  . darifenacin (ENABLEX) 24 hr tablet 7.5 mg  7.5 mg Oral Daily Gonzella Lex, MD   7.5 mg at 04/13/15 0910  . divalproex (DEPAKOTE ER) 24 hr tablet 2,000 mg  2,000 mg Oral QHS Gonzella Lex, MD   2,000 mg at 04/12/15 2214  . furosemide (LASIX) tablet 20 mg  20 mg Oral Daily Gonzella Lex, MD   20 mg at 04/13/15 0911  . gabapentin (NEURONTIN) capsule 100 mg  100 mg Oral TID Gonzella Lex, MD   100 mg at 04/13/15 0911  . haloperidol (HALDOL) tablet 2 mg  2 mg Oral Q6H PRN Gonzella Lex, MD      . insulin glargine (LANTUS) injection 15 Units  15 Units Subcutaneous QHS Gonzella Lex, MD   15 Units at 04/12/15 2215  . lamoTRIgine (LAMICTAL) tablet 200 mg  200 mg Oral Daily Gonzella Lex, MD   200 mg at 04/13/15 0910  .  linagliptin (TRADJENTA) tablet 5 mg  5 mg Oral Daily Gonzella Lex, MD   5 mg at 04/13/15 0911  . lisinopril (PRINIVIL,ZESTRIL) tablet 5 mg  5 mg Oral Daily Gonzella Lex, MD   5 mg at 04/13/15 0911  . loratadine (CLARITIN) tablet 10 mg  10 mg Oral Daily Gonzella Lex, MD   10 mg at 04/13/15 0912  . LORazepam (ATIVAN) tablet 0.5 mg  0.5 mg Oral TID Gonzella Lex, MD   0.5 mg at 04/13/15 0910  . lurasidone (LATUDA) tablet 60 mg  60 mg Oral Q breakfast Chauncey Mann, MD   60 mg  at 04/13/15 0912  . magnesium hydroxide (MILK OF MAGNESIA) suspension 30 mL  30 mL Oral Daily PRN Gonzella Lex, MD      . metoprolol succinate (TOPROL-XL) 24 hr tablet 50 mg  50 mg Oral Daily Gonzella Lex, MD   50 mg at 04/13/15 0911  . polyethylene glycol (MIRALAX / GLYCOLAX) packet 17 g  17 g Oral Daily Gonzella Lex, MD   17 g at 04/13/15 0910  . QUEtiapine (SEROQUEL) tablet 300 mg  300 mg Oral QHS Gonzella Lex, MD   300 mg at 04/12/15 2200  . QUEtiapine (SEROQUEL) tablet 50 mg  50 mg Oral TID Gonzella Lex, MD   50 mg at 04/13/15 0911  . sertraline (ZOLOFT) tablet 200 mg  200 mg Oral Daily Gonzella Lex, MD   200 mg at 04/13/15 0911  . simvastatin (ZOCOR) tablet 20 mg  20 mg Oral q1800 Gonzella Lex, MD   20 mg at 04/12/15 1706  . tamsulosin (FLOMAX) capsule 0.4 mg  0.4 mg Oral QPC supper Gonzella Lex, MD   0.4 mg at 04/12/15 1706    Lab Results:  Results for orders placed or performed during the hospital encounter of 04/10/15 (from the past 48 hour(s))  Glucose, capillary     Status: None   Collection Time: 04/11/15  4:43 PM  Result Value Ref Range   Glucose-Capillary 95 65 - 99 mg/dL  Glucose, capillary     Status: None   Collection Time: 04/12/15  6:43 AM  Result Value Ref Range   Glucose-Capillary 83 65 - 99 mg/dL   Comment 1 Notify RN    Comment 2 Document in Chart   Glucose, capillary     Status: Abnormal   Collection Time: 04/12/15 12:00 PM  Result Value Ref Range   Glucose-Capillary 305  (H) 65 - 99 mg/dL  Glucose, capillary     Status: None   Collection Time: 04/12/15  4:45 PM  Result Value Ref Range   Glucose-Capillary 87 65 - 99 mg/dL  Glucose, capillary     Status: Abnormal   Collection Time: 04/12/15  9:25 PM  Result Value Ref Range   Glucose-Capillary 268 (H) 65 - 99 mg/dL   Comment 1 Notify RN   Valproic acid level     Status: None   Collection Time: 04/13/15  6:46 AM  Result Value Ref Range   Valproic Acid Lvl 77 50.0 - 100.0 ug/mL  Comprehensive metabolic panel     Status: Abnormal   Collection Time: 04/13/15  6:46 AM  Result Value Ref Range   Sodium 141 135 - 145 mmol/L   Potassium 4.2 3.5 - 5.1 mmol/L   Chloride 103 101 - 111 mmol/L   CO2 33 (H) 22 - 32 mmol/L   Glucose, Bld 77 65 - 99 mg/dL   BUN 18 6 - 20 mg/dL   Creatinine, Ser 0.88 0.61 - 1.24 mg/dL   Calcium 8.7 (L) 8.9 - 10.3 mg/dL   Total Protein 5.7 (L) 6.5 - 8.1 g/dL   Albumin 3.3 (L) 3.5 - 5.0 g/dL   AST 21 15 - 41 U/L   ALT 7 (L) 17 - 63 U/L   Alkaline Phosphatase 44 38 - 126 U/L   Total Bilirubin 0.5 0.3 - 1.2 mg/dL   GFR calc non Af Amer >60 >60 mL/min   GFR calc Af Amer >60 >60 mL/min    Comment: (NOTE) The eGFR has been calculated using  the CKD EPI equation. This calculation has not been validated in all clinical situations. eGFR's persistently <60 mL/min signify possible Chronic Kidney Disease.    Anion gap 5 5 - 15  Glucose, capillary     Status: Abnormal   Collection Time: 04/13/15  7:06 AM  Result Value Ref Range   Glucose-Capillary 60 (L) 65 - 99 mg/dL  Glucose, capillary     Status: None   Collection Time: 04/13/15  7:28 AM  Result Value Ref Range   Glucose-Capillary 83 65 - 99 mg/dL  Glucose, capillary     Status: None   Collection Time: 04/13/15 11:56 AM  Result Value Ref Range   Glucose-Capillary 87 65 - 99 mg/dL     Musculoskeletal: Strength & Muscle Tone: within normal limits Gait & Station: normal Patient leans: N/A  Psychiatric Specialty Exam: Review  of Systems  Constitutional: Negative.  Negative for fever, chills, weight loss and malaise/fatigue.  HENT: Negative.  Negative for ear discharge, ear pain, hearing loss, nosebleeds and tinnitus.   Eyes: Negative.  Negative for blurred vision, double vision, photophobia and redness.  Respiratory: Negative for cough, hemoptysis, shortness of breath and wheezing.   Cardiovascular: Negative for chest pain, palpitations, claudication and leg swelling.       Vascular insufficiency in RLE  Gastrointestinal: Negative for heartburn, nausea, vomiting, abdominal pain and diarrhea.  Genitourinary: Negative for dysuria, urgency and frequency.  Musculoskeletal: Negative for myalgias, back pain, joint pain and neck pain.  Skin: Negative.  Negative for itching and rash.  Neurological: Negative for dizziness, tingling, tremors, sensory change, focal weakness, seizures and headaches.  Endo/Heme/Allergies: Negative for environmental allergies. Does not bruise/bleed easily.  Psychiatric/Behavioral: Positive for depression. Negative for suicidal ideas, hallucinations, memory loss and substance abuse. The patient is nervous/anxious. The patient does not have insomnia.     Blood pressure 102/66, pulse 55, temperature 97.8 F (36.6 C), temperature source Oral, resp. rate 18, height 5' 4"  (1.626 m), weight 96.163 kg (212 lb), SpO2 97 %.Body mass index is 36.37 kg/(m^2).  General Appearance: Casual  Eye Contact::  Good  Speech:  Clear and coherent  Volume:  Increased  Mood:  Anxious and Dysphoric  Affect:  Congruent  Thought Process:  Tangential  Orientation:  Full (Time, Place, and Person)  Thought Content:  Hallucinations: None  Suicidal Thoughts:  No  Homicidal Thoughts:  No  Memory:  Immediate;   Fair Recent;   Fair Remote;   Fair  Judgement:  Poor  Insight:  Lacking  Psychomotor Activity:  Increased  Concentration:  Fair  Recall:  [place] of Knowledge:Fair  Language: Good  Akathisia:  Negative   Handed:  Right  AIMS (if indicated):     Assets:  Housing Social Support Transportation  ADL's:  Intact  Cognition: WNL  Sleep:  Number of Hours: 4.75   Treatment Plan Summary:  Diagnosis Schizoaffective disorder, bipolar type Hypertension Hyperlipidemia Diabetes Severe: Chronic mental illness and poor coping skills   Mr. Szczesniak is a 59 year old single Caucasian male with history of schizoaffective disorder bipolar type who was brought to the hospital secondary to emotionally labile behavior including increased agitation. He is currently having problems at the group home that he is living at and is unable to abide by the restrictions of having to not leave the group home without supervision.   Schizoaffective disorder, bipolar type:Latuda was increased from 57m to 60 mg by mouth daily with breakfast for additional mood stabilization but continue on other psychotropic  medications including Depakote 2000 mg by mouth nightly, Lamictal 200 mg by mouth daily and Seroquel 50 mg by mouth 3 times a day and 300 mg by mouth nightly. The patient also had Zoloft 200 mg by mouth daily for depression.  I we'll discontinue Neurontin as I don't see clear indication for this medication.  Anxiety: continue Ativan 0.5 mg by mouth 3 times a day for anxiety.   Hypertension: Will plan to continue Toprol-XL 50 mg by mouth daily, lisinopril 5 mg by mouth daily and Lasix 20 mg by mouth daily. Vital signs are stable.  Diabetes: Hemoglobin A1c was 5.3. We'll continue diabetic diet. We'll continue Lantus 15 mg by mouth nightly and Tradjenta 5 mg by mouth daily.  Hyperlipidemia: Total cholesterol was 153. We'll continue simvastatin 20 mg by mouth daily  BPH: Will continue Flomax 0.4 mg by mouth nightly  Overactive bladder: Will plan to continue Enablex 7.5 mg by mouth daily  Labs: Total cholesterol level was 153 and hemoglobin A1c was 5.3.  Disposition: To be determined. The patient will need to follow up  with a psychiatrist for psychotropic medication management after discharge. He would benefit from a  psychosocial rehabilitation referral. Will discuss with social worker. I plan to contact the patient's guardian. Patient will be discharge in the next 3 days    Daily contact with patient to assess and evaluate symptoms and progress in treatment and Medication management  Hildred Priest 04/13/2015, 12:30 PM

## 2015-04-13 NOTE — Progress Notes (Signed)
He is anxious & easily upset with little things.Stated that he cannot think good things about himself.He likes arts & he is waiting for the crafts groups.Compliant with meds & groups.Appropriate with staff & peers.Denies suicidal ideation & AV hallucination.

## 2015-04-14 LAB — GLUCOSE, CAPILLARY
Glucose-Capillary: 131 mg/dL — ABNORMAL HIGH (ref 65–99)
Glucose-Capillary: 65 mg/dL (ref 65–99)
Glucose-Capillary: 93 mg/dL (ref 65–99)

## 2015-04-14 MED ORDER — LORAZEPAM 2 MG PO TABS
ORAL_TABLET | ORAL | Status: AC
  Administered 2015-04-14: 2 mg via ORAL
  Filled 2015-04-14: qty 1

## 2015-04-14 MED ORDER — QUETIAPINE FUMARATE 200 MG PO TABS
400.0000 mg | ORAL_TABLET | Freq: Every day | ORAL | Status: DC
  Administered 2015-04-14 – 2015-04-16 (×3): 400 mg via ORAL
  Filled 2015-04-14 (×3): qty 2

## 2015-04-14 MED ORDER — CLONAZEPAM 1 MG PO TABS
1.0000 mg | ORAL_TABLET | Freq: Two times a day (BID) | ORAL | Status: DC
  Administered 2015-04-14 – 2015-04-15 (×2): 1 mg via ORAL
  Filled 2015-04-14 (×2): qty 1

## 2015-04-14 MED ORDER — QUETIAPINE FUMARATE 100 MG PO TABS
100.0000 mg | ORAL_TABLET | Freq: Once | ORAL | Status: AC
  Administered 2015-04-14: 100 mg via ORAL

## 2015-04-14 MED ORDER — QUETIAPINE FUMARATE 100 MG PO TABS
100.0000 mg | ORAL_TABLET | Freq: Two times a day (BID) | ORAL | Status: DC
  Administered 2015-04-14 – 2015-04-17 (×6): 100 mg via ORAL
  Filled 2015-04-14 (×6): qty 1

## 2015-04-14 MED ORDER — QUETIAPINE FUMARATE 100 MG PO TABS: 100.0000 mg | ORAL_TABLET | Freq: Two times a day (BID) | ORAL | Status: DC

## 2015-04-14 MED ORDER — LORAZEPAM 2 MG PO TABS
2.0000 mg | ORAL_TABLET | Freq: Once | ORAL | Status: AC
  Administered 2015-04-14: 2 mg via ORAL

## 2015-04-14 MED ORDER — LORAZEPAM 2 MG PO TABS
2.0000 mg | ORAL_TABLET | Freq: Three times a day (TID) | ORAL | Status: DC | PRN
  Administered 2015-04-14: 2 mg via ORAL
  Filled 2015-04-14: qty 1

## 2015-04-14 NOTE — Plan of Care (Signed)
Problem: Alteration in mood; excessive anxiety as evidenced by: Goal: STG-Pt can identify coping skills to manage panic/anxiety (Patient can identify at least ____ coping skills to manage panic/anxiety attack)  Outcome: Not Progressing Patient with period of anxiety/agitation this am after speaking with MD rt discharge planning. Patient with frustrated and with angry outburst. Needs support of 3 staff and prn meds and one to one time to process info and re enforce deep breathing, muscle relaxation and mindfulness.

## 2015-04-14 NOTE — Progress Notes (Signed)
Patient with depressed affect and anxious/agitated behavior this am. Frustrated and has angry outburst after discussing discharge plan with MD. 3-4 staff with patient to help him process frustration and anger. Seroquel and Ativan ordered by MD and administered at this time. Patient needs encouragement and support. Introduced and re enforced deep breathing, muscle relaxation and mindfulness. Patient calm and cooperative after stating his frustration with group home. Patient states group home has them working all the time, or driving around to doctors all the time. Patient states he only gets out with his brother once a week. States he gets to [place] once per week for a short while. Patient seems to want unsupervised time in town. 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 Brother (Guardian) is recommending patient return to same group home. Patient cooperative after staff support and encouragement and attends therapy groups. Receives positive support from male peer. Safety maintained.

## 2015-04-14 NOTE — Progress Notes (Signed)
   04/14/15 1500  Clinical Encounter Type  Visited With Patient  Visit Type Initial  Referral From Patient  Consult/Referral To Chaplain  Spiritual Encounters  Spiritual Needs Prayer;Emotional  Stress Factors  Patient Stress Factors Family relationships;Health changes;Loss of control;Major life changes  Met w/patient to provide pastoral counseling & prayer. Patient spoke of anxiety toward returning to group home after discharge. He stated that anxiety has caused him to act out several times in [facility]. He gave partial name of Production manager who was looking for options. I also suggested that the patient speak to a Education officer, museum about possible options.  Wandra Arthurs. [place]

## 2015-04-14 NOTE — Progress Notes (Signed)
[facility] MD Progress Note  04/14/2015 11:21 AM Alexander Beard  MRN:  [id number]   Principal Problem: Schizoaffective Disorder, Bipolar Type  Subjective:  Patient was seen this morning. He was calm, friendly and cooperative. He was sitting in the dayroom having a snack.  Patient reported that he was feeling about the same no much different since admission. He complains of nervousness and anxiety. Denies suicidality, homicidality or having auditory or visual hallucinations. Denies having major problems with his sleep, appetite energy or concentration. Denies side effects from medications. Denies having any physical complaints. When he was informed that his brother had decided that his best for him to return to the group home and that we were planning on discharging him there when ready he suddenly became agitated and he  stormed out of the room and started screaming and yelling.  He was running around the unit yelling and screaming and punching doors saying "this is a death sentence", "I want to die, I want to die".  Patient receive Ativan 2 mg by mouth once and an extra dose of 100 mg Seroquel.  Per staff who has known the patient from prior admissions, they report his behavior has been displaying the past.  Past psychiatric history  The patient has had multiple prior inpatient psychiatric hospitalizations for schizoaffective disorder but denies any prior suicide attempts. He is currently followed by Dr. Carlena Hurl packs for outpatient psychotropic medication management.   Family psychiatric history The patient denies any history of any mental illness or substance use in the family   Past Medical History  Diabetes, insulin-dependent Hypertension Hyperlipidemia BPH Overactive bladder He denies any history of any prior TBI or seizures    Substance abuse history:  The patient denies any history of any heavy alcohol use or illicit drug use in the past.   Social History:  The patient never  finished high school but did get his GED. 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 He is currently in disability and [name] is his legal guardian. He has been living in group homes for many years.   Diagnosis:   Patient Active Problem List   Diagnosis Date Noted  . Diabetes ([place]) [E11.9] 04/10/2015  . Hypertension [I10] 04/10/2015  . Prostate hypertrophy [N40.0] 04/10/2015  . Schizoaffective disorder ([place]) [F25.9] 04/10/2015  . Schizoaffective disorder, bipolar type ([place]) [F25.0] 12/29/2014  . Active autistic disorder [F84.0] 12/29/2014   Total Time spent with patient: 1 hour    Past Medical History:  Past Medical History  Diagnosis Date  . Schizophrenia, schizoaffective ([place])   . Autistic disorder, residual state   . Anxiety disorder due to known physiological condition   . Anxiety   . Esophageal reflux   . Diabetes mellitus without complication ([place])   . Hypertension   . Developmental disorder   . Arthritis   . Dyslipidemia   . Sleep apnea   . Overactive bladder   . Urinary incontinence    History reviewed. No pertinent past surgical history.  Family History:  Family History  Problem Relation Age of Onset  . Family history unknown: Yes   Social History:  History  Alcohol Use No     History  Drug Use No    Social History   Social History  . Marital Status: Single    Spouse Name: N/A  . Number of Children: N/A  . Years of Education: N/A   Social  History Main Topics  . Smoking status: Never Smoker   . Smokeless tobacco: Never Used  . Alcohol Use: No  . Drug Use: No  . Sexual Activity: No   Other Topics Concern  . None   Social History Narrative   The patient never finished high school but did get his GED. 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 He is currently in disability and [name] is his legal guardian. He  has been living in group homes for many years.      No pending legal charges    Sleep: Fair  Appetite:  Good  Current Medications: Current Facility-Administered Medications  Medication Dose Route Frequency Provider Last Rate Last Dose  . acetaminophen (TYLENOL) tablet 650 mg  650 mg Oral Q6H PRN Gonzella Lex, MD      . alum & mag hydroxide-simeth (MAALOX/MYLANTA) 200-200-20 MG/5ML suspension 30 mL  30 mL Oral Q4H PRN Gonzella Lex, MD      . cholecalciferol (VITAMIN D) tablet 1,000 Units  1,000 Units Oral Daily Gonzella Lex, MD   1,000 Units at 04/14/15 [phone]  . clonazePAM (KLONOPIN) tablet 1 mg  1 mg Oral BID Hildred Priest, MD      . darifenacin (ENABLEX) 24 hr tablet 7.5 mg  7.5 mg Oral Daily Gonzella Lex, MD   7.5 mg at 04/14/15 0849  . divalproex (DEPAKOTE ER) 24 hr tablet 2,000 mg  2,000 mg Oral QHS Gonzella Lex, MD   2,000 mg at 04/13/15 2102  . furosemide (LASIX) tablet 20 mg  20 mg Oral Daily Gonzella Lex, MD   20 mg at 04/14/15 0858  . insulin glargine (LANTUS) injection 15 Units  15 Units Subcutaneous QHS Gonzella Lex, MD   15 Units at 04/13/15 2127  . lamoTRIgine (LAMICTAL) tablet 200 mg  200 mg Oral Daily Gonzella Lex, MD   200 mg at 04/14/15 0855  . linagliptin (TRADJENTA) tablet 5 mg  5 mg Oral Daily Gonzella Lex, MD   5 mg at 04/14/15 0848  . lisinopril (PRINIVIL,ZESTRIL) tablet 5 mg  5 mg Oral Daily Gonzella Lex, MD   5 mg at 04/14/15 0851  . LORazepam (ATIVAN) tablet 2 mg  2 mg Oral TID WC PRN Hildred Priest, MD      . magnesium hydroxide (MILK OF MAGNESIA) suspension 30 mL  30 mL Oral Daily PRN Gonzella Lex, MD      . metoprolol succinate (TOPROL-XL) 24 hr tablet 50 mg  50 mg Oral Daily Gonzella Lex, MD   50 mg at 04/14/15 0851  . polyethylene glycol (MIRALAX / GLYCOLAX) packet 17 g  17 g Oral Daily Gonzella Lex, MD   17 g at 04/14/15 0846  . QUEtiapine (SEROQUEL) tablet 100 mg  100 mg Oral BID Hildred Priest, MD       . QUEtiapine (SEROQUEL) tablet 400 mg  400 mg Oral QHS Hildred Priest, MD      . sertraline (ZOLOFT) tablet 200 mg  200 mg Oral Daily Gonzella Lex, MD   200 mg at 04/14/15 0855  . simvastatin (ZOCOR) tablet 20 mg  20 mg Oral q1800 Gonzella Lex, MD   20 mg at 04/13/15 1702  . tamsulosin (FLOMAX) capsule 0.4 mg  0.4 mg Oral QPC supper Gonzella Lex, MD   0.4 mg at 04/13/15 1702  Lab Results:  Results for orders placed or performed during the hospital encounter of 04/10/15 (from the past 48 hour(s))  Glucose, capillary     Status: Abnormal   Collection Time: 04/12/15 12:00 PM  Result Value Ref Range   Glucose-Capillary 305 (H) 65 - 99 mg/dL  Glucose, capillary     Status: None   Collection Time: 04/12/15  4:45 PM  Result Value Ref Range   Glucose-Capillary 87 65 - 99 mg/dL  Glucose, capillary     Status: Abnormal   Collection Time: 04/12/15  9:25 PM  Result Value Ref Range   Glucose-Capillary 268 (H) 65 - 99 mg/dL   Comment 1 Notify RN   Valproic acid level     Status: None   Collection Time: 04/13/15  6:46 AM  Result Value Ref Range   Valproic Acid Lvl 77 50.0 - 100.0 ug/mL  Comprehensive metabolic panel     Status: Abnormal   Collection Time: 04/13/15  6:46 AM  Result Value Ref Range   Sodium 141 135 - 145 mmol/L   Potassium 4.2 3.5 - 5.1 mmol/L   Chloride 103 101 - 111 mmol/L   CO2 33 (H) 22 - 32 mmol/L   Glucose, Bld 77 65 - 99 mg/dL   BUN 18 6 - 20 mg/dL   Creatinine, Ser 0.88 0.61 - 1.24 mg/dL   Calcium 8.7 (L) 8.9 - 10.3 mg/dL   Total Protein 5.7 (L) 6.5 - 8.1 g/dL   Albumin 3.3 (L) 3.5 - 5.0 g/dL   AST 21 15 - 41 U/L   ALT 7 (L) 17 - 63 U/L   Alkaline Phosphatase 44 38 - 126 U/L   Total Bilirubin 0.5 0.3 - 1.2 mg/dL   GFR calc non Af Amer >60 >60 mL/min   GFR calc Af Amer >60 >60 mL/min    Comment: (NOTE) The eGFR has been calculated using the CKD EPI equation. This calculation has not been validated in all clinical situations. eGFR's  persistently <60 mL/min signify possible Chronic Kidney Disease.    Anion gap 5 5 - 15  Glucose, capillary     Status: Abnormal   Collection Time: 04/13/15  7:06 AM  Result Value Ref Range   Glucose-Capillary 60 (L) 65 - 99 mg/dL  Glucose, capillary     Status: None   Collection Time: 04/13/15  7:28 AM  Result Value Ref Range   Glucose-Capillary 83 65 - 99 mg/dL  Glucose, capillary     Status: None   Collection Time: 04/13/15 11:56 AM  Result Value Ref Range   Glucose-Capillary 87 65 - 99 mg/dL  Glucose, capillary     Status: None   Collection Time: 04/13/15  5:02 PM  Result Value Ref Range   Glucose-Capillary 80 65 - 99 mg/dL   Comment 1 Notify RN   Glucose, capillary     Status: None   Collection Time: 04/13/15  9:02 PM  Result Value Ref Range   Glucose-Capillary 81 65 - 99 mg/dL   Comment 1 Notify RN      Musculoskeletal: Strength & Muscle Tone: within normal limits Gait & Station: normal Patient leans: N/A  Psychiatric Specialty Exam: Review of Systems  Constitutional: Negative.  Negative for fever, chills, weight loss and malaise/fatigue.  HENT: Negative.  Negative for ear discharge, ear pain, hearing loss, nosebleeds and tinnitus.   Eyes: Negative.  Negative for blurred vision, double vision, photophobia and redness.  Respiratory: Negative for cough, hemoptysis, shortness of breath and  wheezing.   Cardiovascular: Negative for chest pain, palpitations, claudication and leg swelling.       Vascular insufficiency in RLE  Gastrointestinal: Negative for heartburn, nausea, vomiting, abdominal pain and diarrhea.  Genitourinary: Negative for dysuria, urgency and frequency.  Musculoskeletal: Negative for myalgias, back pain, joint pain and neck pain.  Skin: Negative.  Negative for itching and rash.  Neurological: Negative for dizziness, tingling, tremors, sensory change, focal weakness, seizures and headaches.  Endo/Heme/Allergies: Negative for environmental allergies.  Does not bruise/bleed easily.  Psychiatric/Behavioral: Positive for depression. Negative for suicidal ideas, hallucinations, memory loss and substance abuse. The patient is nervous/anxious. The patient does not have insomnia.     Blood pressure 117/69, pulse 51, temperature 97.9 F (36.6 C), temperature source Oral, resp. rate 18, height 5' 4"  (1.626 m), weight 96.163 kg (212 lb), SpO2 97 %.Body mass index is 36.37 kg/(m^2).  General Appearance: Casual  Eye Contact::  Good  Speech:  Clear and coherent  Volume:  Increased  Mood:  Anxious and Dysphoric  Affect:  Congruent  Thought Process:  Tangential  Orientation:  Full (Time, Place, and Person)  Thought Content:  Hallucinations: None  Suicidal Thoughts:  No  Homicidal Thoughts:  No  Memory:  Immediate;   Fair Recent;   Fair Remote;   Fair  Judgement:  Poor  Insight:  Lacking  Psychomotor Activity:  Increased  Concentration:  Fair  Recall:  [place] of Knowledge:Fair  Language: Good  Akathisia:  Negative  Handed:  Right  AIMS (if indicated):     Assets:  Housing Social Support Transportation  ADL's:  Intact  Cognition: WNL  Sleep:  Number of Hours: 5.5   Treatment Plan Summary:  Diagnosis Schizoaffective disorder, bipolar type Hypertension Hyperlipidemia Diabetes Severe: Chronic mental illness and poor coping skills   Alexander Beard is a 59 year old single Caucasian male with history of schizoaffective disorder bipolar type who was brought to the hospital secondary to emotionally labile behavior including increased agitation. He is currently having problems at the group home that he is living at and is unable to abide by the restrictions of having to not leave the group home without supervision.  Today the patient was informed that we were planning on discharging him back to the group home whenever he was stable. The patient became very agitated and had to receive when necessary.   Schizoaffective disorder, bipolar type:  Today I plan to increase his Seroquel to 100 mg twice a day which will be given at 10 AM and 2 PM and 400 mg at bedtime. Anette Guarneri will be discontinued as this medication has failed to add any benefit in agitation or anxiety. For mood instability the patient will be continued on Depakote ER 2000 mg at bedtime and Lamictal 200 mg a day.  Anxiety: Patient will be continued on Zoloft 200 mg a day. I we will discontinue Ativan which he has been getting at the group home. His home dose is Ativan 0.5 mg 3 times a day. Today I we'll start him on clonazepam 1 mg by mouth twice a day.  Agitation: Patient has been started on Ativan every 8 hours as needed.  Hypertension: Will plan to continue Toprol-XL 50 mg by mouth daily, lisinopril 5 mg by mouth daily and Lasix 20 mg by mouth daily. Vital signs are stable.  Diabetes: Hemoglobin A1c was 5.3. We'll continue diabetic diet. We'll continue Lantus 15 mg by mouth nightly and Tradjenta 5 mg by mouth daily.  Hyperlipidemia: Total  cholesterol was 153. We'll continue simvastatin 20 mg by mouth daily  BPH: Will continue Flomax 0.4 mg by mouth nightly  Overactive bladder: Will plan to continue Enablex 7.5 mg by mouth daily  Labs: Total cholesterol level was 153 and hemoglobin A1c was 5.3.  Disposition: Social worker contacted the patient's brother was the patient's healthcare power of attorney. He would like for the patient to return to the same group home where he has been living for many years. His brother feels that physical placement for him. Social worker reports these is a group home with a good reputation in the community.  Social worker tells me that the patient has been in our hospital with similar complaints about not wanting to return to his group home in the past. He eventually calms down and agrees with discharge back to the facility. They also tell me that the patient was part of psychosocial rehabilitation, together house, but he was discharged due to  disruptive behavior. Patient has reported to staff that his main concern is that he is not allowed to walk outside the group home which he used to do in the past.   Daily contact with patient to assess and evaluate symptoms and progress in treatment and Medication management  Hildred Priest 04/14/2015, 11:21 AM

## 2015-04-14 NOTE — Plan of Care (Signed)
Problem: Ineffective individual coping Goal: STG: Patient will remain free from self harm Outcome: Progressing Pt has remained free from self harm     

## 2015-04-14 NOTE — Progress Notes (Signed)
Patient with increasing anxiety and prn Ativan prn administered with fair effect. Patient states to Probation officer he is tired. Blood glucose obtained and recorded. Patient to dayroom for dinner with good appetite,  patient remains with tremor and has some difficulty eating his rice, encouraged to use a spoon. Patient noted to be leaning to right side in dayroom chair and unsteady gait noted by staff on way to dayroom. Staff informs patient and moves his room to 310 to be closer to nurse's station. Patient encouraged to rest after dinner. States he wants to watch wheel of fortune with peers. Staff supervises ambulation to tv viewing area. Patient safety maintained.

## 2015-04-14 NOTE — Tx Team (Signed)
Interdisciplinary Treatment Plan Update (Adult)  Date:  04/14/2015 Time Reviewed:  3:11 PM  Progress in Treatment: Attending groups: Yes. Participating in groups:  Yes. Taking medication as prescribed:  Yes. Tolerating medication:  Yes. Family/Significant othe contact made:  Yes, individual(s) contacted:  Alexander Beard&#39;s brother Stephen Masur [phone] Alexander Beard understands diagnosis:  Yes. Discussing Alexander Beard identified problems/goals with staff:  Yes. Medical problems stabilized or resolved:  Yes. Denies suicidal/homicidal ideation: Yes. Issues/concerns per Alexander Beard self-inventory:  Yes.Alexander Beard does not want to return to group home however have spoken with family and the group home and this is due to Alexander Beard is not able to walk around town as he wishes due to liability reasons of the group home. Other:  New problem(s) identified: Yes, Describe:  Alexander Beard was banging on the door wanting to leave yelling that he did not want to return to the group home  Discharge Plan or Barriers: Alexander Beard will stabilize on meds and discharge back to group home with followup with Dr. [name] at [facility].   Reason for Continuation of Hospitalization: Aggression Anxiety Medication stabilization  Comments:  Estimated length of stay: up to 3 days expected discharge Friday 04/17/15  New goal(s):  Review of initial/current Alexander Beard goals per problem list:   1.  Goal(s): decrease anxiety  Met:  No  Target date: at discharge     Attendees: Physician:  Andrea Hernandez, MD 10/4/20163:11 PM  Nursing:   Jennifer Morrow, RN 10/4/20163:11 PM  Other:   , LCSWA 10/4/20163:11 PM  Other:   10/4/20163:11 PM  Other:   10/4/20163:11 PM  Other:  10/4/20163:11 PM  Other:  10/4/20163:11 PM  Other:  10/4/20163:11 PM  Other:  10/4/20163:11 PM  Other:  10/4/20163:11 PM  Other:  10/4/20163:11 PM  Other:   10/4/20163:11 PM   Scribe for Treatment Team:   ,  T, MSW, LCSWA   04/14/2015, 3:11 PM  

## 2015-04-14 NOTE — BHH Group Notes (Signed)
[facility] Group Notes:  (Nursing/MHT/Case Management/Adjunct)  Date:  04/14/2015  Time:  5:13 PM  Type of Therapy:  Psychoeducational Skills  Participation Level:  Active  Participation Quality:  Appropriate, Attentive and Sharing  Affect:  Appropriate  Cognitive:  Alert and Appropriate  Insight:  Appropriate and Good  Engagement in Group:  Engaged  Modes of Intervention:  Discussion, Education and Support  Summary of Progress/Problems:  Alexander Beard 04/14/2015, 5:13 PM

## 2015-04-14 NOTE — Progress Notes (Signed)
D: Pt is awake and active in the milieu this evening. Pt mood is anxious and his affect is anxious as well. Pt denies SI/HI and AVH at this time, and is interacting appropriately with staff and peers. Pt interaction is somewhat childlike and he is attention seeking/dependent on staff.  A: Writer provided emotional support and administered medications as prescribed.   R: Pt is taking medications as prescribed and is pleasant and cooperative with staff. Pt stayed up watching TV until later in the evening.

## 2015-04-15 LAB — GLUCOSE, CAPILLARY
GLUCOSE-CAPILLARY: 76 mg/dL (ref 65–99)
GLUCOSE-CAPILLARY: 135 mg/dL — AB (ref 65–99)
GLUCOSE-CAPILLARY: 97 mg/dL (ref 65–99)
Glucose-Capillary: 71 mg/dL (ref 65–99)

## 2015-04-15 MED ORDER — LORAZEPAM 1 MG PO TABS
1.0000 mg | ORAL_TABLET | Freq: Three times a day (TID) | ORAL | Status: DC | PRN
  Administered 2015-04-15: 1 mg via ORAL
  Filled 2015-04-15: qty 1

## 2015-04-15 MED ORDER — INSULIN GLARGINE 100 UNIT/ML ~~LOC~~ SOLN
12.0000 [IU] | Freq: Every day | SUBCUTANEOUS | Status: DC
  Administered 2015-04-15 – 2015-04-16 (×2): 12 [IU] via SUBCUTANEOUS
  Filled 2015-04-15 (×3): qty 0.12

## 2015-04-15 MED ORDER — DIVALPROEX SODIUM ER 500 MG PO TB24
1500.0000 mg | ORAL_TABLET | Freq: Every day | ORAL | Status: DC
  Administered 2015-04-15 – 2015-04-16 (×2): 1500 mg via ORAL
  Filled 2015-04-15 (×2): qty 3

## 2015-04-15 MED ORDER — CLONAZEPAM 0.5 MG PO TABS
0.5000 mg | ORAL_TABLET | Freq: Two times a day (BID) | ORAL | Status: DC
  Administered 2015-04-15 – 2015-04-16 (×2): 0.5 mg via ORAL
  Filled 2015-04-15 (×2): qty 1

## 2015-04-15 NOTE — Clinical Social Work Note (Signed)
CSW met with patient and patient shared he did not want to go to his group home and wants a new one. This Probation officer explained that hospital staff will not be responsible for finding him a new placement and that he may have to return but will talk to other staff, patient's brother, and his current group home about options to help meet patient's needs of getting out of the group home more and active. Patient is agreeable to this plan.

## 2015-04-15 NOTE — BHH Group Notes (Signed)
[facility] Group Notes:  (Nursing/MHT/Case Management/Adjunct)  Date:  04/15/2015  Time:  5:18 AM  Type of Therapy:  Group Therapy  Participation Level:  Active  Participation Quality:  Monopolizing and Redirectable  Affect:  Excited and Tearful  Cognitive:  Alert  Insight:  Appropriate  Engagement in Group:  Monopolizing  Modes of Intervention:  Discussion  Summary of Progress/Problems: When asked about his goal. PT said that his goal was to figure out who he needed to speak with to get out of his group home. PT became very excited and tearful when talking about how unhappy he is at his current group home. Staff advised PT to try to remain positive. He said their was nothing positive about it. As staff asked others about their goals, PT would try to interrupt and continue his discussion about his group home. Staff had to redirect the PT numerous times before the end of group.   Jenetta Downer Nickalos Petersen 04/15/2015, 5:18 AM

## 2015-04-15 NOTE — BHH Group Notes (Signed)
[facility] LCSW Aftercare Discharge Planning Group Note   04/15/2015 11:42 AM  Participation Quality:  Patient attended and participated in group discussion. Patient introduced himself and shared his SMART goal out of turn as he was impatient and shared that he wanted to "find independent housing, a place I can work, or a group home with more activities and gives me more freedom". Patient stated that he would act out if he had to go back and this Probation officer confronted patient if he was threatening. Patient shared that "its a promise". CSW asked patient to meet after group to discuss his problem outside of group.   Mood/Affect:  Anxious, Irritable and Labile    Thoughts of Suicide:  No Will you contract for safety?   NA  Current AVH:  No  Plan for Discharge/Comments:  Patient wants a new group home and will continue to see DR. [name]  Transportation Means: Patient has no transportation needs at this time.  Supports: Patient has a group home, outpatient followup, and a brother who is supportive.  Keene Breath, MSW, LCSWA

## 2015-04-15 NOTE — BHH Group Notes (Signed)
[facility] Group Notes:  (Nursing/MHT/Case Management/Adjunct)  Date:  04/15/2015  Time:  2:28 PM  Type of Therapy:  Psychoeducational Skills  Participation Level:  Minimal  Participation Quality:  Drowsy  Affect:  Lethargic  Cognitive:  Confused  Insight:  Limited  Engagement in Group:  Limited  Modes of Intervention:  Discussion, Education and Support  Summary of Progress/Problems:  Alexander Beard 04/15/2015, 2:28 PM

## 2015-04-15 NOTE — Progress Notes (Signed)
Inpatient Diabetes Program Recommendations  AACE/ADA: New Consensus Statement on Inpatient Glycemic Control (2015)  Target Ranges:  Prepandial:   less than 140 mg/dL      Peak postprandial:   less than 180 mg/dL (1-2 hours)      Critically ill patients:  140 - 180 mg/dL  Results for Alexander Beard, Alexander Beard (MRN [id number]) as of 04/15/2015 07:29  Ref. Range 04/14/2015 12:13 04/14/2015 17:02 04/14/2015 21:10 04/15/2015 06:44  Glucose-Capillary Latest Ref Range: 65-99 mg/dL 131 (H) 65 93 71   Review of Glycemic Control  Current orders for Inpatient glycemic control: Lantus 15 units QHS, Tradjenta 5 mg daily  Inpatient Diabetes Program Recommendations: Insulin - Basal: Glucose ranged from 65-131 mg/dl yesterday and fasting glucose is noted to be 71 mg/dl this morning. Please consider decreasing Lantus to 12 units QHS.  Thanks, Barnie Alderman, RN, MSN, CCRN, CDE Diabetes Coordinator Inpatient Diabetes Program [phone] (Team Pager from [place] to [place]) [phone] ([facility] office) [phone] [facility] office) [phone] [facility] office)

## 2015-04-15 NOTE — Progress Notes (Addendum)
[facility] MD Progress Note  04/15/2015 3:44 PM Alexander Beard  MRN:  [id number]   Principal Problem: Schizoaffective Disorder, Bipolar Type  Subjective:  Patient was seen this afternoon he was lying in bed, he appeared drowsy and at least twice he fell asleep during the assessment. Patient continues to state that if he gets discharged back to his group home he will be miserable for the rest of his life. He is stated that he will feel suicidal if he has to return there. He stated that he will keep coming into the emergency department over and over again. It was explained to him that because he is healthcare power of attorney has made the decision of having him return to the group home we are unable to change his living situation. I also explained to him that his group home has a very good reputation and that the rules or regulations from his group home will be the same at other facilities.  After that the patient got out of bed and was started screaming and yelling again he left the room and started running around the unit.  He was redirected by nurses and was asked to stay in a more secluded area of the unit until he calmed down.  No prn was given.  Patient displays exactly the same behavior yesterday after being told he was going to be discharged to the group home this week. His tstarted running around the unit banging on doors and windows. Patient receive Ativan 2 mg once.  Per nursing: D: Patient denies SI/HI/AVH. Patient affect and mood are anxious. Patient did attend evening group. Patient visible on the milieu. No distress noted. A: Support and encouragement offered. Scheduled medications given to pt. Q 15 min checks continued for patient safety. R: Patient receptive. Patient remains safe on the unit.   Diagnosis:   Patient Active Problem List   Diagnosis Date Noted  . Diabetes ([place]) [E11.9] 04/10/2015  . Hypertension [I10] 04/10/2015  . Prostate hypertrophy [N40.0] 04/10/2015  . Schizoaffective  disorder ([place]) [F25.9] 04/10/2015  . Schizoaffective disorder, bipolar type ([place]) [F25.0] 12/29/2014  . Active autistic disorder [F84.0] 12/29/2014   Total Time spent with patient: 30 minutes    Substance abuse history:  The patient denies any history of any heavy alcohol use or illicit drug use in the past.  Past psychiatric history  The patient has had multiple prior inpatient psychiatric hospitalizations for schizoaffective disorder but denies any prior suicide attempts. He is currently followed by Dr. Carlena Hurl packs for outpatient psychotropic medication management.  Past Medical History: Diabetes, insulin-dependent Hypertension Hyperlipidemia BPH Overactive bladder He denies any history of any prior TBI or seizures Past Medical History  Diagnosis Date  . Schizophrenia, schizoaffective ([place])   . Autistic disorder, residual state   . Anxiety disorder due to known physiological condition   . Anxiety   . Esophageal reflux   . Diabetes mellitus without complication ([place])   . Hypertension   . Developmental disorder   . Arthritis   . Dyslipidemia   . Sleep apnea   . Overactive bladder   . Urinary incontinence    History reviewed. No pertinent past surgical history.  Family History: The patient denies any history of any mental illness or substance use in the family Family History  Problem Relation Age of Onset  . Family history unknown: Yes   Social History: The patient never finished high school but did get his GED. 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 He has never been married and has no children. He is currently in disability and his brother Alexander Beard is his legal guardian. He has been living in group homes for many years. History  Alcohol Use No     History  Drug Use No    Social History   Social History  . Marital Status: Single    Spouse Name: N/A  . Number of Children: N/A  . Years of Education: N/A   Social History Main Topics  . Smoking  status: Never Smoker   . Smokeless tobacco: Never Used  . Alcohol Use: No  . Drug Use: No  . Sexual Activity: No   Other Topics Concern  . None   Social History Narrative   The patient never finished high school but did get his GED. 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 He is currently in disability and his brother Alexander Beard is his legal guardian. He has been living in group homes for many years.      No pending legal charges    Sleep: Good  Appetite:  Good  Current Medications: Current Facility-Administered Medications  Medication Dose Route Frequency Provider Last Rate Last Dose  . acetaminophen (TYLENOL) tablet 650 mg  650 mg Oral Q6H PRN Gonzella Lex, MD      . alum & mag hydroxide-simeth (MAALOX/MYLANTA) 200-200-20 MG/5ML suspension 30 mL  30 mL Oral Q4H PRN Gonzella Lex, MD      . cholecalciferol (VITAMIN D) tablet 1,000 Units  1,000 Units Oral Daily Gonzella Lex, MD   1,000 Units at 04/15/15 0915  . clonazePAM (KLONOPIN) tablet 0.5 mg  0.5 mg Oral BID Hildred Priest, MD      . darifenacin (ENABLEX) 24 hr tablet 7.5 mg  7.5 mg Oral Daily Gonzella Lex, MD   7.5 mg at 04/15/15 0914  . divalproex (DEPAKOTE ER) 24 hr tablet 1,500 mg  1,500 mg Oral QHS Hildred Priest, MD      . furosemide (LASIX) tablet 20 mg  20 mg Oral Daily Gonzella Lex, MD   20 mg at 04/15/15 0915  . insulin glargine (LANTUS) injection 12 Units  12 Units Subcutaneous QHS Hildred Priest, MD      . lamoTRIgine (LAMICTAL) tablet 200 mg  200 mg Oral Daily Gonzella Lex, MD   200 mg at 04/15/15 0916  . linagliptin (TRADJENTA) tablet 5 mg  5 mg Oral Daily Gonzella Lex, MD   5 mg at 04/15/15 0915  . lisinopril (PRINIVIL,ZESTRIL) tablet 5 mg  5 mg Oral Daily Gonzella Lex, MD   5 mg at 04/15/15 0916  . LORazepam (ATIVAN) tablet 1 mg  1 mg Oral TID WC PRN Hildred Priest, MD      . magnesium hydroxide  (MILK OF MAGNESIA) suspension 30 mL  30 mL Oral Daily PRN Gonzella Lex, MD      . metoprolol succinate (TOPROL-XL) 24 hr tablet 50 mg  50 mg Oral Daily Gonzella Lex, MD   50 mg at 04/15/15 0915  . polyethylene glycol (MIRALAX / GLYCOLAX) packet 17 g  17 g Oral Daily Gonzella Lex, MD   17 g at 04/15/15 0912  . QUEtiapine (SEROQUEL) tablet 100 mg  100 mg Oral BID Hildred Priest, MD   100 mg at 04/15/15 0916  . QUEtiapine (SEROQUEL) tablet 400 mg  400 mg Oral QHS Hildred Priest,  MD   400 mg at 04/14/15 2111  . sertraline (ZOLOFT) tablet 200 mg  200 mg Oral Daily Gonzella Lex, MD   200 mg at 04/15/15 0914  . simvastatin (ZOCOR) tablet 20 mg  20 mg Oral q1800 Gonzella Lex, MD   20 mg at 04/14/15 1644  . tamsulosin (FLOMAX) capsule 0.4 mg  0.4 mg Oral QPC supper Gonzella Lex, MD   0.4 mg at 04/14/15 1645    Lab Results:  Results for orders placed or performed during the hospital encounter of 04/10/15 (from the past 48 hour(s))  Glucose, capillary     Status: None   Collection Time: 04/13/15  5:02 PM  Result Value Ref Range   Glucose-Capillary 80 65 - 99 mg/dL   Comment 1 Notify RN   Glucose, capillary     Status: None   Collection Time: 04/13/15  9:02 PM  Result Value Ref Range   Glucose-Capillary 81 65 - 99 mg/dL   Comment 1 Notify RN   Glucose, capillary     Status: Abnormal   Collection Time: 04/14/15 12:13 PM  Result Value Ref Range   Glucose-Capillary 131 (H) 65 - 99 mg/dL   Comment 1 Notify RN   Glucose, capillary     Status: None   Collection Time: 04/14/15  5:02 PM  Result Value Ref Range   Glucose-Capillary 65 65 - 99 mg/dL   Comment 1 Notify RN   Glucose, capillary     Status: None   Collection Time: 04/14/15  9:10 PM  Result Value Ref Range   Glucose-Capillary 93 65 - 99 mg/dL  Glucose, capillary     Status: None   Collection Time: 04/15/15  6:44 AM  Result Value Ref Range   Glucose-Capillary 71 65 - 99 mg/dL  Glucose, capillary      Status: None   Collection Time: 04/15/15 12:00 PM  Result Value Ref Range   Glucose-Capillary 76 65 - 99 mg/dL   Comment 1 Notify RN      Musculoskeletal: Strength & Muscle Tone: within normal limits Gait & Station: normal Patient leans: N/A  Psychiatric Specialty Exam: Review of Systems  Constitutional: Negative.  Negative for fever, chills, weight loss and malaise/fatigue.  HENT: Negative.  Negative for ear discharge, ear pain, hearing loss, nosebleeds and tinnitus.   Eyes: Negative.  Negative for blurred vision, double vision, photophobia and redness.  Respiratory: Negative.  Negative for cough, hemoptysis, shortness of breath and wheezing.   Cardiovascular: Negative.  Negative for chest pain, palpitations, claudication and leg swelling.       Vascular insufficiency in RLE  Gastrointestinal: Negative.  Negative for heartburn, nausea, vomiting, abdominal pain and diarrhea.  Genitourinary: Negative.  Negative for dysuria, urgency and frequency.  Musculoskeletal: Negative.  Negative for myalgias, back pain, joint pain and neck pain.  Skin: Negative.  Negative for itching and rash.  Neurological: Negative.  Negative for dizziness, tingling, tremors, sensory change, focal weakness, seizures and headaches.  Endo/Heme/Allergies: Negative.  Negative for environmental allergies. Does not bruise/bleed easily.  Psychiatric/Behavioral: Positive for depression. Negative for suicidal ideas, hallucinations, memory loss and substance abuse. The patient is nervous/anxious. The patient does not have insomnia.     Blood pressure 95/68, pulse 97, temperature 97.9 F (36.6 C), temperature source Oral, resp. rate 20, height 5\' 4"  (1.626 m), weight 96.163 kg (212 lb), SpO2 97 %.Body mass index is 36.37 kg/(m^2).  General Appearance: Casual  Eye Contact::  Good  Speech:  Clear  and coherent  Volume:  Increased  Mood:  Anxious and Irritable  Affect:  Congruent  Thought Process:  Tangential   Orientation:  Full (Time, Place, and Person)  Thought Content:  Hallucinations: None  Suicidal Thoughts:  No  Homicidal Thoughts:  No  Memory:  Immediate;   Fair Recent;   Fair Remote;   Fair  Judgement:  Poor  Insight:  Lacking  Psychomotor Activity:  Normal  Concentration:  Fair  Recall:  [place] of Knowledge:Fair  Language: Good  Akathisia:  Negative  Handed:  Right  AIMS (if indicated):     Assets:  Housing Social Support Transportation  ADL's:  Intact  Cognition: WNL  Sleep:  Number of Hours: 6.75   Treatment Plan Summary:  Diagnosis Schizoaffective disorder, bipolar type Hypertension Hyperlipidemia Diabetes Severe: Chronic mental illness and poor coping skills   Mr. Alexander Beard is a 59 year old single Caucasian male with history of schizoaffective disorder bipolar type who was brought to the hospital secondary to emotionally labile behavior including increased agitation. He is currently having problems at the group home that he is living at and is unable to abide by the restrictions of having to not leave the group home without supervision.  Today the patient was informed that we were planning on discharging him back to the group home whenever he was stable. The patient became very agitated and had to receive when necessary.  On 10/4 very agitated after being told he was going to be discharged back to the group home patient started running around the unit and banging doors and screaming and yelling "this is a death sentence", "I want to die". This went on for about 10-15 minutes. Patient was very disruptive to the milieu. He had to be given Ativan 2 mg and an extra dose of Seroquel 100 mg.  Patient was apologetic later on. On 10/5 patient displayed same behavior after he was told he was going to be discharged to the group home. He stormed out of the room and started yelling and screaming. He was able to calm down after about 10 minutes with the assistance of nursing. No prn  given.  Schizoaffective disorder, bipolar type: Seroquel was increased on 10/4  to 100 mg twice a day which will be given at 10 AM and 2 PM and 400 mg at bedtime. Anette Guarneri has been d/c  as this medication has failed to add any benefit in agitation or anxiety. For mood instability the patient will be continued on Depakote ER and Lamictal 200 mg a day.  Today I plan to decrease the dose of Depakote from 2000 mg to 1500 mg as patient has severe tremors. Likely this tremors are not primarily caused by Depakote but Depakote is well-known to worsen tremors.  Anxiety: Patient will be continued on Zoloft 200 mg a day. On 10/4 he was started on klonopin 1 mg po bid instead of ativan 0.5 mg tid.  Today he appeared drowsy during assessment. I will decrease clonazepam to 0.5 mg by mouth twice a day  Agitation: Patient has been started on Ativan every 8 hours as needed.  Dose of when necessary Ativan will be decreased from 2 mg to 1 mg as nursing reported patient was overly sedated yesterday after receiving the 2 mg dose.  Severe tremors: Possibly essential tremors. Patient is currently on Seroquel which has low potential or worsening tremors. I will also decrease his Depakote to 1500 daily at bedtime instead of 2000 as Depakote can also  worsen tremors.  Hypertension: Will plan to continue Toprol-XL 50 mg by mouth daily, lisinopril 5 mg by mouth daily and Lasix 20 mg by mouth daily. Vital signs are stable.  Diabetes: Hemoglobin A1c was 5.3. We'll continue diabetic diet. Per Diabetes specilist recommendations dose of Lantus was decreased to 12 units.  Hyperlipidemia: Total cholesterol was 153. Continue simvastatin 20 mg by mouth daily  BPH: Will continue Flomax 0.4 mg by mouth nightly  Overactive bladder: Continue Enablex 7.5 mg by mouth daily  Labs: Total cholesterol level was 153 and hemoglobin A1c was 5.3.  Disposition: Social worker contacted the patient's brother was the patient's healthcare power of  attorney. He would like for the patient to return to the same group home where he has been living for many years.  Social worker reports these is a group home with a good reputation in the community.  Social worker tells me that the patient has been in our hospital with similar complaints about not wanting to return to his group home in the past. He eventually calms down and agrees with discharge back to the facility. They also tell me that the patient was part of psychosocial rehabilitation, together house, but he was discharged due to disruptive behavior. Patient has reported to staff that his main concern is that he is not allowed to walk outside the group home which he used to do in the past. Tentative discharge day Friday.   Daily contact with patient to assess and evaluate symptoms and progress in treatment and Medication management  Hildred Priest 04/15/2015, 3:44 PM

## 2015-04-15 NOTE — Progress Notes (Signed)
Patient with anxious affect, cooperative with meals (good appetite) and meds. Patient encouraged to attend therapy groups. Patient intrusive and impulsive. Refuses to accept that Guardian recommends patient to return to previous [place] living situation. Patient with one angry outburst today. Needs extensive 1:1 with staff, decreased stimulation, support and encouragement to process discharge plan. 1:1 with little effect. Patient expresses he wants more freedom by living in town or city, going back to school, volunteering, holding a job. Patient expresses he wants more arts, crafts and [place] singing. Patient with little insight or motivation to follow recommended plan of care. Low tolerance level. Safety maintained.

## 2015-04-15 NOTE — Plan of Care (Signed)
Problem: Consults Goal: [facility] General Treatment Patient Education Outcome: Not Progressing Patient with increasing anxiety and agitation rt discharge plan. Refuses to accept that his Guardian wants him to return to previous living situation.

## 2015-04-15 NOTE — Progress Notes (Signed)
Blood sugar monitored and insulin administered as ordered. No s/s of hypo/hyperglycemia.

## 2015-04-15 NOTE — Progress Notes (Signed)
D: Patient denies SI/HI/AVH.  Patient affect and mood are anxious.  Patient did attend evening group. Patient visible on the milieu. No distress noted. A: Support and encouragement offered. Scheduled medications given to pt. Q 15 min checks continued for patient safety. R: Patient receptive. Patient remains safe on the unit.   

## 2015-04-16 LAB — GLUCOSE, CAPILLARY
GLUCOSE-CAPILLARY: 128 mg/dL — AB (ref 65–99)
Glucose-Capillary: 74 mg/dL (ref 65–99)
Glucose-Capillary: 98 mg/dL (ref 65–99)
Glucose-Capillary: 98 mg/dL (ref 65–99)

## 2015-04-16 MED ORDER — QUETIAPINE FUMARATE 100 MG PO TABS: 100.0000 mg | ORAL_TABLET | Freq: Two times a day (BID) | ORAL | Status: DC

## 2015-04-16 MED ORDER — INSULIN GLARGINE 100 UNIT/ML ~~LOC~~ SOLN: 12.0000 [IU] | Freq: Every day | SUBCUTANEOUS | Status: DC

## 2015-04-16 MED ORDER — QUETIAPINE FUMARATE 400 MG PO TABS: 400.0000 mg | ORAL_TABLET | Freq: Every day | ORAL | Status: DC

## 2015-04-16 MED ORDER — LORAZEPAM 1 MG PO TABS: 1.0000 mg | ORAL_TABLET | Freq: Three times a day (TID) | ORAL | Status: DC | PRN

## 2015-04-16 MED ORDER — CLONAZEPAM 0.5 MG PO TABS
0.5000 mg | ORAL_TABLET | Freq: Three times a day (TID) | ORAL | Status: DC
  Administered 2015-04-16 – 2015-04-17 (×3): 0.5 mg via ORAL
  Filled 2015-04-16 (×3): qty 1

## 2015-04-16 MED ORDER — DIVALPROEX SODIUM ER 500 MG PO TB24: 1500.0000 mg | ORAL_TABLET | Freq: Every day | ORAL | Status: DC

## 2015-04-16 MED ORDER — CLONAZEPAM 0.5 MG PO TABS: 0.5000 mg | ORAL_TABLET | Freq: Three times a day (TID) | ORAL | Status: DC

## 2015-04-16 NOTE — Plan of Care (Signed)
Problem: Ineffective individual coping Goal: STG: Patient will remain free from self harm Outcome: Progressing Patient denies SI/HI, 15 minutes checks maintained.      

## 2015-04-16 NOTE — Progress Notes (Signed)
D: Patient has remained very anxious and needing reassurance today. He reported concern about his RLE being "black" and not getting any circulation. He said that his socks were too tight and he needed help putting them on. On assessment, his RLE appeared discolored on the calf, but there did not seem to be any acute problem. He asked about wearing compression hose. Physician notified. A: Patient informed that his leg did not appear very swollen and compression hose would probably not be good for his skin. Offered reassurance. On q 15 minute checks. Given meds. R: Anxious but cooperative. No outbursts.

## 2015-04-16 NOTE — [facility] Group Notes (Signed)
[place] LCSW Group Therapy  04/16/2015 2:32 PM  Type of Therapy:  Group Therapy  Participation Level:  Active  Participation Quality:  Appropriate  Affect:  Anxious and Tearful  Cognitive:  Alert  Insight:  Limited  Engagement in Therapy:  Limited  Modes of Intervention:  Discussion, Education, Socialization and Support  Summary of Progress/Problems: Balance in life: Patients will discuss the concept of balance and how it looks and feels to be unbalanced. Pt will identify areas in their life that is unbalanced and ways to become more balanced.  Jayvan attended group and stayed the entire time. He discussed difficulties he has experienced at his group home. He discussed his outburst but would not take responsibility for his own actions. He states he is disappointed and sad that he cannot go to another group home.    [place] MSW, [place]  04/16/2015, 2:32 PM

## 2015-04-16 NOTE — Progress Notes (Signed)
[facility] MD Progress Note  04/16/2015 9:32 AM Alexander Beard  MRN:  [id number]   Principal Problem: Schizoaffective Disorder, Bipolar Type  Subjective:  Patient was seen this afternoon he was angry at this provider and was very short with me.  He denied having any issues or concerns. Denies side effects from medications. Denied having problems with his sleep, appetite, energy or concentration. He denied having SI, HI or auditory or visual hallucinations. Later on we met again along with his Education officer, museum and will discuss discharge planning. The patient was able to stay calm throughout the conversation and did not any behavioral outburst.  Patient requested a family meeting with his brother tomorrow prior to discharge.  Social worker will look into a senior center where he can go a few times a week to spend some time away from the group home.    Yesterday again screaming and yelling, he left the room and started running around the unit.  He was redirected by nurses and was asked to stay in a more secluded area of the unit until he calmed down.  No prn was given. Acting out behavior was triggered by being told the plan continues to be for him to return to his group home  Patient displays exactly the same behavior 10/4 after being told he was going to be discharged to the group home this week. His tstarted running around the unit banging on doors and windows. Patient receive Ativan 2 mg once.  Per nursing: Patient is alert and oriented x 4, denies pain, affect is flat, sad,anxious and apprehensive. Mood is impulse and intrusive, interaction is childlike, frequent redirection throughout the evening to protect and provide safety. Patient verbalized disapointment and anger that he's going back to his group home. Patient stated " l wants to live in the city or go the school and not feel isolated with more freedom" Patient has no insight into his treatment plan, safety maintained on unit, will continue on  monitor.          Diagnosis:   Patient Active Problem List   Diagnosis Date Noted  . Diabetes ([place]) [E11.9] 04/10/2015  . Hypertension [I10] 04/10/2015  . Prostate hypertrophy [N40.0] 04/10/2015  . Schizoaffective disorder ([place]) [F25.9] 04/10/2015  . Schizoaffective disorder, bipolar type ([place]) [F25.0] 12/29/2014  . Active autistic disorder [F84.0] 12/29/2014   Total Time spent with patient: 30 minutes    Substance abuse history:  The patient denies any history of any heavy alcohol use or illicit drug use in the past.  Past psychiatric history  The patient has had multiple prior inpatient psychiatric hospitalizations for schizoaffective disorder but denies any prior suicide attempts. He is currently followed by Dr. Carlena Hurl packs for outpatient psychotropic medication management.  Past Medical History: Diabetes, insulin-dependent Hypertension Hyperlipidemia BPH Overactive bladder He denies any history of any prior TBI or seizures Past Medical History  Diagnosis Date  . Schizophrenia, schizoaffective ([place])   . Autistic disorder, residual state   . Anxiety disorder due to known physiological condition   . Anxiety   . Esophageal reflux   . Diabetes mellitus without complication ([place])   . Hypertension   . Developmental disorder   . Arthritis   . Dyslipidemia   . Sleep apnea   . Overactive bladder   . Urinary incontinence    History reviewed. No pertinent past surgical history.  Family History: The patient denies any history of any mental illness or substance use in the family Family History  Problem Relation Age of Onset  . Family history unknown: Yes   Social History: The patient never finished high school but did get his GED. 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 He is currently in disability and [name] is his legal guardian. He has been living in group homes for many years. History   Alcohol Use No     History  Drug Use No    Social History   Social History  . Marital Status: Single    Spouse Name: N/A  . Number of Children: N/A  . Years of Education: N/A   Social History Main Topics  . Smoking status: Never Smoker   . Smokeless tobacco: Never Used  . Alcohol Use: No  . Drug Use: No  . Sexual Activity: No   Other Topics Concern  . None   Social History Narrative   The patient never finished high school but did get his GED. 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 He is currently in disability and [name] is his legal guardian. He has been living in group homes for many years.      No pending legal charges    Sleep: Good  Appetite:  Good  Current Medications: Current Facility-Administered Medications  Medication Dose Route Frequency Provider Last Rate Last Dose  . acetaminophen (TYLENOL) tablet 650 mg  650 mg Oral Q6H PRN Gonzella Lex, MD      . alum & mag hydroxide-simeth (MAALOX/MYLANTA) 200-200-20 MG/5ML suspension 30 mL  30 mL Oral Q4H PRN Gonzella Lex, MD      . cholecalciferol (VITAMIN D) tablet 1,000 Units  1,000 Units Oral Daily Gonzella Lex, MD   1,000 Units at 04/15/15 0915  . clonazePAM (KLONOPIN) tablet 0.5 mg  0.5 mg Oral BID Hildred Priest, MD   0.5 mg at 04/15/15 2237  . darifenacin (ENABLEX) 24 hr tablet 7.5 mg  7.5 mg Oral Daily Gonzella Lex, MD   7.5 mg at 04/15/15 0914  . divalproex (DEPAKOTE ER) 24 hr tablet 1,500 mg  1,500 mg Oral QHS Hildred Priest, MD   1,500 mg at 04/15/15 2236  . furosemide (LASIX) tablet 20 mg  20 mg Oral Daily Gonzella Lex, MD   20 mg at 04/15/15 0915  . insulin glargine (LANTUS) injection 12 Units  12 Units Subcutaneous QHS Hildred Priest, MD   12 Units at 04/15/15 2243  . lamoTRIgine (LAMICTAL) tablet 200 mg  200 mg Oral Daily Gonzella Lex, MD   200 mg at 04/15/15 0916  . linagliptin  (TRADJENTA) tablet 5 mg  5 mg Oral Daily Gonzella Lex, MD   5 mg at 04/15/15 0915  . lisinopril (PRINIVIL,ZESTRIL) tablet 5 mg  5 mg Oral Daily Gonzella Lex, MD   5 mg at 04/15/15 0916  . LORazepam (ATIVAN) tablet 1 mg  1 mg Oral TID WC PRN Hildred Priest, MD   1 mg at 04/15/15 2238  . magnesium hydroxide (MILK OF MAGNESIA) suspension 30 mL  30 mL Oral Daily PRN Gonzella Lex, MD      . metoprolol succinate (TOPROL-XL) 24 hr tablet 50 mg  50 mg Oral Daily Gonzella Lex, MD   50 mg at 04/15/15 0915  . polyethylene glycol (MIRALAX / GLYCOLAX) packet 17 g  17 g Oral Daily John  T [name], MD   17 g at 04/15/15 0912  . QUEtiapine (SEROQUEL) tablet 100 mg  100 mg Oral BID Hildred Priest, MD   100 mg at 04/15/15 2237  . QUEtiapine (SEROQUEL) tablet 400 mg  400 mg Oral QHS Hildred Priest, MD   400 mg at 04/15/15 2237  . sertraline (ZOLOFT) tablet 200 mg  200 mg Oral Daily Gonzella Lex, MD   200 mg at 04/15/15 0914  . simvastatin (ZOCOR) tablet 20 mg  20 mg Oral q1800 Gonzella Lex, MD   20 mg at 04/15/15 1646  . tamsulosin (FLOMAX) capsule 0.4 mg  0.4 mg Oral QPC supper Gonzella Lex, MD   0.4 mg at 04/15/15 1646    Lab Results:  Results for orders placed or performed during the hospital encounter of 04/10/15 (from the past 48 hour(s))  Glucose, capillary     Status: Abnormal   Collection Time: 04/14/15 12:13 PM  Result Value Ref Range   Glucose-Capillary 131 (H) 65 - 99 mg/dL   Comment 1 Notify RN   Glucose, capillary     Status: None   Collection Time: 04/14/15  5:02 PM  Result Value Ref Range   Glucose-Capillary 65 65 - 99 mg/dL   Comment 1 Notify RN   Glucose, capillary     Status: None   Collection Time: 04/14/15  9:10 PM  Result Value Ref Range   Glucose-Capillary 93 65 - 99 mg/dL  Glucose, capillary     Status: None   Collection Time: 04/15/15  6:44 AM  Result Value Ref Range   Glucose-Capillary 71 65 - 99 mg/dL  Glucose, capillary      Status: None   Collection Time: 04/15/15 12:00 PM  Result Value Ref Range   Glucose-Capillary 76 65 - 99 mg/dL   Comment 1 Notify RN   Glucose, capillary     Status: Abnormal   Collection Time: 04/15/15  4:46 PM  Result Value Ref Range   Glucose-Capillary 135 (H) 65 - 99 mg/dL   Comment 1 Notify RN   Glucose, capillary     Status: None   Collection Time: 04/15/15  8:39 PM  Result Value Ref Range   Glucose-Capillary 97 65 - 99 mg/dL   Comment 1 Notify RN   Glucose, capillary     Status: None   Collection Time: 04/16/15  6:45 AM  Result Value Ref Range   Glucose-Capillary 74 65 - 99 mg/dL     Musculoskeletal: Strength & Muscle Tone: within normal limits Gait & Station: normal Patient leans: N/A  Psychiatric Specialty Exam: Review of Systems  Constitutional: Negative.  Negative for fever, chills, weight loss and malaise/fatigue.  HENT: Negative.  Negative for ear discharge, ear pain, hearing loss, nosebleeds and tinnitus.   Eyes: Negative.  Negative for blurred vision, double vision, photophobia and redness.  Respiratory: Negative.  Negative for cough, hemoptysis, shortness of breath and wheezing.   Cardiovascular: Negative.  Negative for chest pain, palpitations, claudication and leg swelling.       Vascular insufficiency in RLE  Gastrointestinal: Negative.  Negative for heartburn, nausea, vomiting, abdominal pain and diarrhea.  Genitourinary: Negative.  Negative for dysuria, urgency and frequency.  Musculoskeletal: Negative.  Negative for myalgias, back pain, joint pain and neck pain.  Skin: Negative.  Negative for itching and rash.  Neurological: Negative.  Negative for dizziness, tingling, tremors, sensory change, focal weakness, seizures and headaches.  Endo/Heme/Allergies: Negative.  Negative for environmental allergies. Does not bruise/bleed  easily.  Psychiatric/Behavioral: Positive for depression. Negative for suicidal ideas, hallucinations, memory loss and substance  abuse. The patient is nervous/anxious. The patient does not have insomnia.     Blood pressure 106/63, pulse 54, temperature 97.3 F (36.3 C), temperature source Oral, resp. rate 20, height 5' 4" (1.626 m), weight 96.163 kg (212 lb), SpO2 97 %.Body mass index is 36.37 kg/(m^2).  General Appearance: Casual  Eye Contact::  Good  Speech:  Clear and coherent  Volume:  Increased  Mood:  Anxious and Irritable  Affect:  Congruent  Thought Process:  Tangential  Orientation:  Full (Time, Place, and Person)  Thought Content:  Hallucinations: None  Suicidal Thoughts:  No  Homicidal Thoughts:  No  Memory:  Immediate;   Fair Recent;   Fair Remote;   Fair  Judgement:  Poor  Insight:  Lacking  Psychomotor Activity:  Normal  Concentration:  Fair  Recall:  Bright of Knowledge:Fair  Language: Good  Akathisia:  Negative  Handed:  Right  AIMS (if indicated):     Assets:  Housing Social Support Transportation  ADL's:  Intact  Cognition: WNL  Sleep:  Number of Hours: 6.25   Treatment Plan Summary:  Diagnosis Schizoaffective disorder, bipolar type Hypertension Hyperlipidemia Diabetes Severe: Chronic mental illness and poor coping skills   Alexander Beard is a 59 year old single Caucasian male with history of schizoaffective disorder bipolar type who was brought to the hospital secondary to emotionally labile behavior including increased agitation. He is currently having problems at the group home that he is living at and is unable to abide by the restrictions of having to not leave the group home without supervision.  Today the patient was informed that we were planning on discharging him back to the group home whenever he was stable. The patient became very agitated and had to receive when necessary.  On 10/4 very agitated after being told he was going to be discharged back to the group home patient started running around the unit and banging doors and screaming and yelling "this is a death  sentence", "I want to die". This went on for about 10-15 minutes. Patient was very disruptive to the milieu. He had to be given Ativan 2 mg and an extra dose of Seroquel 100 mg.  Patient was apologetic later on. On 10/5 patient displayed same behavior after he was told he was going to be discharged to the group home. He stormed out of the room and started yelling and screaming. He was able to calm down after about 10 minutes with the assistance of nursing. No prn given.  Schizoaffective disorder, bipolar type: Seroquel was increased on 10/4  to 100 mg twice a day which will be given at 10 AM and 2 PM and 400 mg at bedtime. Anette Guarneri has been d/c  as this medication has failed to add any benefit in agitation or anxiety. For mood instability the patient will be continued on Depakote ER and Lamictal 200 mg a day.   Depakote has been decreased from 2000 mg to 1500 mg as patient has severe tremors. Likely this tremors are not primarily caused by Depakote but Depakote is well-known to worsen tremors.  Anxiety: Patient will be continued on Zoloft 200 mg a day. On 10/4 he was started on klonopin 1 mg po bid instead of ativan 0.5 mg tid.  On 10/5 he appeared drowsy during assessment and therefore Klonopin was decreased to 0.5 mg by mouth twice a day.  Agitation: On  October 4 he was restarted on Ativan 2 mg every 8 hours for agitation. The patient received 1 dose of Ativan 2 mg after having an episode of yelling and screaming. After the nurses reported he was overly sedated. Therefore the dose of  Prn ativan has been decreased to 1 mg every 8 hours as needed .  Severe tremors: Possibly essential tremors. Patient is currently on Seroquel which has low potential or worsening tremors. I will also decrease his Depakote to 1500 daily at bedtime instead of 2000 as Depakote can also worsen tremors.  Hypertension: Will plan to continue Toprol-XL 50 mg by mouth daily, lisinopril 5 mg by mouth daily and Lasix 20 mg by mouth  daily. Vital signs are stable.  Diabetes: Hemoglobin A1c was 5.3. We'll continue diabetic diet. Per Diabetes specilist recommendations dose of Lantus was decreased to 12 units.  Hyperlipidemia: Total cholesterol was 153. Continue simvastatin 20 mg by mouth daily  BPH: Will continue Flomax 0.4 mg by mouth nightly  Overactive bladder: Continue Enablex 7.5 mg by mouth daily  Labs: Total cholesterol level was 153 and hemoglobin A1c was 5.3.  Disposition: Social worker contacted the patient's brother was the patient's healthcare power of attorney. He would like for the patient to return to the same group home where he has been living for many years.  Social worker reports these is a group home with a good reputation in the community.  Social worker tells me that the patient has been in our hospital with similar complaints about not wanting to return to his group home in the past. He eventually calms down and agrees with discharge back to the facility. They also tell me that the patient was part of psychosocial rehabilitation, together house, but he was discharged due to disruptive behavior. Patient has reported to staff that his main concern is that he is not allowed to walk outside the group home which he used to do in the past. Tentative discharge day Friday.   Daily contact with patient to assess and evaluate symptoms and progress in treatment and Medication management  Hildred Priest 04/16/2015, 9:32 AM

## 2015-04-16 NOTE — BHH Group Notes (Signed)
[facility] Group Notes:  (Nursing/MHT/Case Management/Adjunct)  Date:  04/16/2015  Time:  2:06 PM  Type of Therapy:  Psychoeducational Skills  Participation Level:  Active  Participation Quality:  Redirectable  Affect:  Flat  Cognitive:  Appropriate  Insight:  Improving  Engagement in Group:  Off Topic and Poor  Modes of Intervention:  Clarification and Discussion  Summary of Progress/Problems:  Celso [name] 04/16/2015, 2:06 PM

## 2015-04-16 NOTE — Plan of Care (Signed)
Problem: Alteration in mood; excessive anxiety as evidenced by: Goal: LTG-Patient's behavior demonstrates decreased anxiety (Patient's behavior demonstrates anxiety and he/she is utilizing learned coping skills to deal with anxiety-producing situations)  Outcome: Progressing Patient less intrusive today. Responsive to redirection.

## 2015-04-16 NOTE — Progress Notes (Signed)
Patient is alert and oriented x 4, denies pain, affect is flat, sad,anxious and apprehensive. Mood is impulse and intrusive, interaction is childlike, frequent redirection throughout the evening to protect and provide safety.  Patient verbalized disapointment and anger that he's going back to his group home. Patient stated " l wants to live in the city or go the school and not feel isolated with  more freedom"  Patient has no insight into his treatment plan, safety maintained on unit, will continue on monitor.

## 2015-04-16 NOTE — Progress Notes (Signed)
Pastoral Care and prayer provided.  °

## 2015-04-17 LAB — GLUCOSE, CAPILLARY
GLUCOSE-CAPILLARY: 88 mg/dL (ref 65–99)
GLUCOSE-CAPILLARY: 76 mg/dL (ref 65–99)

## 2015-04-17 NOTE — Progress Notes (Signed)
Patient denies SI/HI, denies A/V hallucinations. Patient verbalizes understanding of discharge instructions, follow up care and prescriptions. Patient given all belongings from personal locker. Patient escorted out by staff, transported by brother.

## 2015-04-17 NOTE — Tx Team (Signed)
Interdisciplinary Treatment Plan Update (Adult)  Date:  04/17/2015 Time Reviewed:  9:46 AM  Progress in Treatment: Attending groups: Yes. Participating in groups:  Yes. Taking medication as prescribed:  Yes. Tolerating medication:  Yes. Family/Significant othe contact made:  Yes, individual(s) contacted:  patient&#39;s brother Roan Miklos [phone] Patient understands diagnosis:  Yes. Discussing patient identified problems/goals with staff:  Yes. Medical problems stabilized or resolved:  Yes. Denies suicidal/homicidal ideation: Yes. Issues/concerns per patient self-inventory:  No. Other:  New problem(s) identified: Yes, Describe:  patient was banging on the door wanting to leave yelling that he did not want to return to the group home  Discharge Plan or Barriers: Patient will stabilize on meds and discharge back to group home with followup with [name] at [facility]. Patient was improved today and was agreeable to discharge though this is not his ideal plan but plans to seek other living arrangements that would be an improvement for his desires.   Reason for Continuation of Hospitalization: Aggression Anxiety Medication stabilization  Comments:  Estimated length of stay: up to 1 day expected discharge Friday 04/17/15  New goal(s):  Review of initial/current patient goals per problem list:   1.  Goal(s): decrease anxiety  Met:  Yes     Attendees: Physician:  Merlyn Albert, MD 10/7/20169:46 AM  Nursing:   Mordecai Rasmussen, RN 10/7/20169:46 AM  Other:  Carmell Austria, [place] 10/7/20169:46 AM  Other:   10/7/20169:46 AM  Other:   10/7/20169:46 AM  Other:  10/7/20169:46 AM  Other:  10/7/20169:46 AM  Other:  10/7/20169:46 AM  Other:  10/7/20169:46 AM  Other:  10/7/20169:46 AM  Other:  10/7/20169:46 AM  Other:   10/7/20169:46 AM   Scribe for Treatment Team:   Keene Breath, MSW, LCSWA  04/17/2015, 9:46 AM

## 2015-04-17 NOTE — [facility] Suicide Risk Assessment (Signed)
[facility] Discharge Suicide Risk Assessment   Demographic Factors:  Male and Caucasian  Total Time spent with patient: 30 minutes  Musculoskeletal: Strength & Muscle Tone: within normal limits Gait & Station: normal Patient leans: N/A  Psychiatric Specialty Exam: Physical Exam  Constitutional: He is oriented to person, place, and time. He appears well-developed and well-nourished.  HENT:  Head: Normocephalic and atraumatic.  Eyes: Conjunctivae and EOM are normal.  Neck: Normal range of motion.  Respiratory: Effort normal.  Musculoskeletal: Normal range of motion.  Neurological: He is alert and oriented to person, place, and time.  Skin: Skin is warm and dry.    Review of Systems  Constitutional: Negative.   HENT: Negative.   Eyes: Negative.   Respiratory: Negative.   Cardiovascular: Negative.   Gastrointestinal: Negative.   Genitourinary: Negative.   Musculoskeletal: Negative.   Skin: Negative.   Neurological: Negative.   Endo/Heme/Allergies: Negative.   Psychiatric/Behavioral: Negative for depression, suicidal ideas, hallucinations, memory loss and substance abuse. The patient is nervous/anxious. The patient does not have insomnia.                                                            Have you used any form of tobacco in the last 30 days? (Cigarettes, Smokeless Tobacco, Cigars, and/or Pipes): No  Has this patient used any form of tobacco in the last 30 days? (Cigarettes, Smokeless Tobacco, Cigars, and/or Pipes) No  Mental Status Per Nursing Assessment::   On Admission:     Current Mental Status by Physician: Calm, pleasant, friendly, cooperative. No evidence of aggression or agitation. No evidence of psychosis. Patient denies SI and HI  Loss Factors: Decline in physical health  Historical Factors: Impulsivity  Risk Reduction Factors:   Sense of responsibility to family, Living with another person, especially a relative and Positive  social support  Continued Clinical Symptoms:  Severe Anxiety and/or Agitation More than one psychiatric diagnosis Previous Psychiatric Diagnoses and Treatments Medical Diagnoses and Treatments/Surgeries  Cognitive Features That Contribute To Risk:  Closed-mindedness    Suicide Risk:  Minimal: No identifiable suicidal ideation.  Patients presenting with no risk factors but with morbid ruminations; may be classified as minimal risk based on the severity of the depressive symptoms  Principal Problem: Schizoaffective disorder, bipolar type [facility]) Discharge Diagnoses:  Patient Active Problem List   Diagnosis Date Noted  . Diabetes ([place]) [E11.9] 04/10/2015  . Hypertension [I10] 04/10/2015  . Prostate hypertrophy [N40.0] 04/10/2015  . Schizoaffective disorder ([place]) [F25.9] 04/10/2015  . Schizoaffective disorder, bipolar type ([place]) [F25.0] 12/29/2014  . Active autistic disorder [F84.0] 12/29/2014     Is patient on multiple antipsychotic therapies at discharge:  No   Has Patient had three or more failed trials of antipsychotic monotherapy by history:  No  Recommended Plan for Multiple Antipsychotic Therapies: NA    Hildred Priest 04/17/2015, 9:29 AM

## 2015-04-17 NOTE — Progress Notes (Signed)
Patient is alert and oriented x4.  Patient interacts well with peers and staff.  Patient is medication compliant at this tim.  Patient denies any SI/HI/AVH at this time.  Patient is anticipating being discharged today. Will continue to monitor for safety.

## 2015-04-17 NOTE — Discharge Summary (Signed)
Physician Discharge Summary Note  Patient:  Alexander Beard is an 59 y.o., male MRN:  [id number] DOB:  [birth year] Patient phone:  [phone] (home)  Patient address:   [address] [place] [address],  Total Time spent with patient: 30 minutes  Date of Admission:  04/10/2015 Date of Discharge: 04/17/2015  Reason for Admission:  Behavioral outbursts and agitation  Principal Problem: Schizoaffective disorder, bipolar type [facility]) Discharge Diagnoses: Patient Active Problem List   Diagnosis Date Noted  . Diabetes ([place]) [E11.9] 04/10/2015  . Hypertension [I10] 04/10/2015  . Prostate hypertrophy [N40.0] 04/10/2015  . Schizoaffective disorder ([place]) [F25.9] 04/10/2015  . Schizoaffective disorder, bipolar type ([place]) [F25.0] 12/29/2014  . Active autistic disorder [F84.0] 12/29/2014    Musculoskeletal: Strength & Muscle Tone: within normal limits Gait & Station: unsteady Patient leans: N/A  Psychiatric Specialty Exam: Physical Exam  Constitutional: He is oriented to person, place, and time. He appears well-developed and well-nourished.  HENT:  Head: Normocephalic and atraumatic.  Eyes: Conjunctivae and EOM are normal.  Neck: Normal range of motion.  Respiratory: Effort normal.  Musculoskeletal: Normal range of motion.  Neurological: He is alert and oriented to person, place, and time.  Skin: Skin is warm and dry.  Psychiatric: He has a normal mood and affect. His behavior is normal.    Review of Systems  Constitutional: Negative.   HENT: Negative.   Eyes: Negative.   Respiratory: Negative.   Cardiovascular: Negative.   Gastrointestinal: Negative.   Genitourinary: Negative.   Musculoskeletal: Negative.   Skin: Negative.   Neurological: Negative.   Psychiatric/Behavioral: Negative for depression, suicidal ideas, hallucinations, memory loss and substance abuse. The patient is nervous/anxious. The patient does not have insomnia.      Blood pressure 116/72, pulse 56, temperature 97.3 F (36.3 C), temperature source Oral, resp. rate 20, height 5' 4"  (1.626 m), weight 96.163 kg (212 lb), SpO2 97 %.Body mass index is 36.37 kg/(m^2).  General Appearance: Fairly Groomed  Engineer, water::  Good  Speech:  Clear and Coherent  Volume:  Normal  Mood:  Anxious  Affect:  Congruent  Thought Process:  concrete  Orientation:  Full (Time, Place, and Person)  Thought Content:  Hallucinations: None  Suicidal Thoughts:  No  Homicidal Thoughts:  No  Memory:  Immediate;   Good Recent;   Good Remote;   Good  Judgement:  Fair  Insight:  Fair  Psychomotor Activity:  Normal  Concentration:  NA  Recall:  NA  Fund of Knowledge:Fair  Language: Fair  Akathisia:  No  Handed:    AIMS (if indicated):     Assets:  Agricultural consultant Housing Social Support  ADL's:  Intact  Cognition: WNL  Sleep:  Number of Hours: 6.5   History of Present Illness: Alexander Beard is a 59 year old single Caucasian male with a history of schizoaffective disorder, bipolar type and some developmental delay, possible autism who was brought to the emergency room by police secondary to behavioral problems at the group home. Per the group home, the patient has been decompensating and acting out with agitated behavior the past one week. He then ran away from the group home and police had to be called to bring him back to the group home. The patient is very upset that he is not allowed to walk around town about any restrictions which he did do frequently in the past. He is very unhappy living there and is reporting passive suicidal thoughts  secondary to these restrictions prohibiting him from walking in the community. He denies any current active suicidal thoughts but does appear to have very poor coping skills and difficulty with emotional lability and anger. He denies any homicidal thoughts. He denies any current auditory or visual  hallucinations. He denies any paranoid thoughts or delusions. Mood is been depressed and the patient has had frequent crying spells. He is also reporting difficulty with focus and concentration. He says he does not also get along with other residents at the group home. The patient wishes to be more independent in the group home will allow him. He denies any history of any heavy alcohol use or illicit drug use and has been compliant with psychotropic medications at the group home. There is no history of any prior suicide attempts. The patient currently has a legal guardian, his brother   Past psychiatric history  The patient has had multiple prior inpatient psychiatric hospitalizations for schizoaffective disorder but denies any prior suicide attempts. He is currently followed by Dr. Carlena Hurl packs for outpatient psychotropic medication management.   Family psychiatric history The patient denies any history of any mental illness or substance use in the family   Past Medical History  Diabetes, insulin-dependent Hypertension Hyperlipidemia BPH Overactive bladder He denies any history of any prior TBI or seizures    Substance abuse history:  The patient denies any history of any heavy alcohol use or illicit drug use in the past.   Social History:  The patient never finished high school but did get his GED. 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 He is currently in disability and his brother Alexander Beard is his legal guardian. He has been living in group homes for many years.   Risk to Self: Is patient at risk for suicide?: No Risk to Others:   Prior Inpatient Therapy:   Prior Outpatient Therapy:    Alcohol Screening: Patient refused Alcohol Screening Tool: Yes 1. How often do you have a drink containing alcohol?: Never 2. How many drinks containing alcohol do you have on a typical day when you are drinking?: 1 or 2 3. How  often do you have six or more drinks on one occasion?: Never Preliminary Score: 0 9. Have you or someone else been injured as a result of your drinking?: No 10. Has a relative or friend or a doctor or another health worker been concerned about your drinking or suggested you cut down?: No Alcohol Use Disorder Identification Test Final Score (AUDIT): 0 Brief Intervention: AUDIT score less than 7 or less-screening does not suggest unhealthy drinking-brief intervention not indicated   Substance Abuse History in the last 12 months: No. Consequences of Substance Abuse: Negative Previous Psychotropic Medications: Yes  Psychological Evaluations: Yes  Past Medical History:  Past Medical History  Diagnosis Date  . Schizophrenia, schizoaffective ([place])   . Autistic disorder, residual state   . Anxiety disorder due to known physiological condition   . Anxiety   . Esophageal reflux   . Diabetes mellitus without complication ([place])   . Hypertension   . Developmental disorder   . Arthritis   . Dyslipidemia   . Sleep apnea   . Overactive bladder   . Urinary incontinence    History reviewed. No pertinent past surgical history. Family History:  Family History  Problem Relation Age of Onset  . Family history unknown: Yes    Social History:  History  Alcohol Use No    History  Drug Use No    Social History   Social History  . Marital Status: Single    Spouse Name: N/A  . Number of Children: N/A  . Years of Education: N/A   Social History Main Topics  . Smoking status: Never Smoker   . Smokeless tobacco: Never Used  . Alcohol Use: No  . Drug Use: No  . Sexual Activity: No   Other Topics Concern  . None   Social History Narrative   The patient never finished high school but did get his GED. He works in the past as a Museum/gallery conservator. He has never been married and has  no children. He is currently in disability and his brother Alexander Beard is his legal guardian. He has been living in group homes for many years.      No pending legal charges         Hospital Course:    Mr. Byrns is a 59 year old single Caucasian male with history of schizoaffective disorder bipolar type who was brought to the hospital secondary to emotionally labile behavior including increased agitation. He is currently having problems at the group home that he is living at and is unable to abide by the restrictions of having to not leave the group home without supervision. Today the patient was informed that we were planning on discharging him back to the group home whenever he was stable. The patient became very agitated and had to receive when necessary.  On 10/4 very agitated after being told he was going to be discharged back to the group home patient started running around the unit and banging doors and screaming and yelling "this is a death sentence", "I want to die". This went on for about 10-15 minutes. Patient was very disruptive to the milieu. He had to be given Ativan 2 mg and an extra dose of Seroquel 100 mg. Patient was apologetic later on. On 10/5 patient displayed same behavior after he was told he was going to be discharged to the group home. He stormed out of the room and started yelling and screaming. He was able to calm down after about 10 minutes with the assistance of nursing. No prn given.  On 10/6 again team met with the patient and informed him about the discharge planning is to his group home patient was able to have a conversation without becoming agitated. Patient understood that his brother, who is his healthcare power of attorney, had made the decision for him to return to the group home as he feels the patient has received proper care there.  Schizoaffective disorder, bipolar type: Seroquel was increased on 10/4 to 100 mg twice a day which will be given at 10 AM and 2  PM and 400 mg at bedtime. Anette Guarneri has been d/c as this medication has failed to add any benefit in agitation or anxiety. For mood instability the patient will be continued on Depakote ER and Lamictal 200 mg a day. Depakote has been decreased from 2000 mg to 1500 mg as patient has severe tremors. Likely this tremors are not primarily caused by Depakote but Depakote is well-known to worsen tremors.  Anxiety: Patient will be continued on Zoloft 200 mg a day. On 10/4 he was started on klonopin 1 mg po bid instead of ativan 0.5 mg tid. On 10/5 he appeared drowsy during assessment and therefore Klonopin was decreased to 0.5 mg by mouth twice a day.  Agitation: On  October 4 he was restarted on Ativan 2 mg every 8 hours for agitation. The patient received 1 dose of Ativan 2 mg after having an episode of yelling and screaming. After the nurses reported he was overly sedated. Therefore the dose of Prn ativan has been decreased to 1 mg every 8 hours as needed .  Severe tremors: Possibly essential tremors. Patient is currently on Seroquel which has low potential or worsening tremors. I will also decrease his Depakote to 1500 daily at bedtime instead of 2000 as Depakote can also worsen tremors.  Hypertension: Will plan to continue Toprol-XL 50 mg by mouth daily, lisinopril 5 mg by mouth daily and Lasix 20 mg by mouth daily. Vital signs are stable.  Diabetes: Hemoglobin A1c was 5.3. We'll continue diabetic diet. Per Diabetes specilist recommendations dose of Lantus was decreased to 12 units.  Hyperlipidemia: Total cholesterol was 153. Continue simvastatin 20 mg by mouth daily  BPH: Will continue Flomax 0.4 mg by mouth nightly  Overactive bladder: Continue Enablex 7.5 mg by mouth daily  Labs: Total cholesterol level was 153 and hemoglobin A1c was 5.3.  Disposition: Social worker contacted the patient's brother was the patient's healthcare power of attorney. He would like for the patient to return to the  same group home where he has been living for many years. Social worker reports these is a group home with a good reputation in the community. Social worker tells me that the patient has been in our hospital with similar complaints about not wanting to return to his group home in the past. He eventually calms down and agrees with discharge back to the facility. They also tell me that the patient was part of psychosocial rehabilitation, together house, but he was discharged due to disruptive behavior. Patient has reported to staff that his main concern is that he is not allowed to walk outside the group home which he used to do in the past.  On the day of the discharge the patient was much calmer, pleasant and cooperative. There were no episodes of agitation and more than 24 hours. He denied SI, HI or having auditory or visual hallucinations. Patient did not require seclusion, restraints or forced medications during his stay in the hospital. There were no medical complications during this admission. Patient did have 3 episodes described above.      Discharge Vitals:   Blood pressure 116/72, pulse 56, temperature 97.3 F (36.3 C), temperature source Oral, resp. rate 20, height 5' 4"  (1.626 m), weight 96.163 kg (212 lb), SpO2 97 %. Body mass index is 36.37 kg/(m^2).  Lab Results:    Results for JADON, HARBAUGH (MRN [id number]) as of 04/17/2015 09:28  Ref. Range 04/10/2015 12:55 04/10/2015 15:12 04/10/2015 19:06 04/13/2015 06:46  Sodium Latest Ref Range: 135-145 mmol/L 138   141  Potassium Latest Ref Range: 3.5-5.1 mmol/L 4.3   4.2  Chloride Latest Ref Range: 101-111 mmol/L 104   103  CO2 Latest Ref Range: 22-32 mmol/L 29   33 (H)  BUN Latest Ref Range: 6-20 mg/dL 20   18  Creatinine Latest Ref Range: 0.61-1.24 mg/dL 1.04   0.88  Calcium Latest Ref Range: 8.9-10.3 mg/dL 9.1   8.7 (L)  EGFR (Non-African Amer.) Latest Ref Range: >60 mL/min >60   >60  EGFR (African American) Latest Ref Range: >60 mL/min  >60   >60  Glucose Latest Ref Range: 65-99 mg/dL 163 (H)   77  Anion gap Latest Ref Range: 5-15  5  5  Alkaline Phosphatase Latest Ref Range: 38-126 U/L 52   44  Albumin Latest Ref Range: 3.5-5.0 g/dL 3.9   3.3 (L)  AST Latest Ref Range: 15-41 U/L 26   21  ALT Latest Ref Range: 17-63 U/L 8 (L)   7 (L)  Total Protein Latest Ref Range: 6.5-8.1 g/dL 7.1   5.7 (L)  Total Bilirubin Latest Ref Range: 0.3-1.2 mg/dL 0.8   0.5  Cholesterol Latest Ref Range: 0-200 mg/dL   153   Triglycerides Latest Ref Range: <150 mg/dL   166 (H)   HDL Cholesterol Latest Ref Range: >40 mg/dL   50   LDL (calc) Latest Ref Range: 0-99 mg/dL   70   VLDL Latest Ref Range: 0-40 mg/dL   33   Total CHOL/HDL Ratio Latest Units: RATIO   3.1   WBC Latest Ref Range: 3.8-10.6 K/uL 5.0     RBC Latest Ref Range: 4.40-5.90 MIL/uL 4.18 (L)     Hemoglobin Latest Ref Range: 13.0-18.0 g/dL 13.5     HCT Latest Ref Range: 40.0-52.0 % 40.3     MCV Latest Ref Range: 80.0-100.0 fL 96.4     MCH Latest Ref Range: 26.0-34.0 pg 32.3     MCHC Latest Ref Range: 32.0-36.0 g/dL 33.5     RDW Latest Ref Range: 11.5-14.5 % 13.0     Platelets Latest Ref Range: 150-440 K/uL 409 (L)     Salicylate Lvl Latest Ref Range: 2.8-30.0 mg/dL <4.0     Valproic Acid Lvl Latest Ref Range: 50.0-100.0 ug/mL 73   77  Acetaminophen Latest Ref Range: 10-30 ug/mL <10 (L)     Hemoglobin A1C Latest Ref Range: 4.0-6.0 %   5.6   TSH Latest Ref Range: 0.350-4.500 uIU/mL   2.776   Alcohol, Ethyl (B) Latest Ref Range: <5 mg/dL <5     Amphetamines, Ur Screen Latest Ref Range: NONE DETECTED   NONE DETECTED    Barbiturates, Ur Screen Latest Ref Range: NONE DETECTED   NONE DETECTED    Benzodiazepine, Ur Scrn Latest Ref Range: NONE DETECTED   POSITIVE (A)    Cocaine Metabolite,Ur Angie Latest Ref Range: NONE DETECTED   NONE DETECTED    Methadone Scn, Ur Latest Ref Range: NONE DETECTED   NONE DETECTED    MDMA (Ecstasy)Ur Screen Latest Ref Range: NONE DETECTED   NONE DETECTED     Cannabinoid 50 Ng, Ur [place] Latest Ref Range: NONE DETECTED   NONE DETECTED    Opiate, Ur Screen Latest Ref Range: NONE DETECTED   NONE DETECTED    Phencyclidine (PCP) Ur S Latest Ref Range: NONE DETECTED   NONE DETECTED    Tricyclic, Ur Screen Latest Ref Range: NONE DETECTED   POSITIVE (A)       Discharge Instructions    Diet - low sodium heart healthy    Complete by:  As directed             Medication List    STOP taking these medications        gabapentin 100 MG capsule  Commonly known as:  NEURONTIN     haloperidol 2 MG tablet  Commonly known as:  HALDOL     loratadine 10 MG tablet  Commonly known as:  CLARITIN     lurasidone 40 MG Tabs tablet  Commonly known as:  LATUDA     naproxen 500 MG tablet  Commonly known as:  NAPROSYN      TAKE these medications  Indication   clonazePAM 0.5 MG tablet  Commonly known as:  KLONOPIN  Take 1 tablet (0.5 mg total) by mouth 3 (three) times daily.  Notes to Patient:  Anxiety      divalproex 500 MG 24 hr tablet  Commonly known as:  DEPAKOTE ER  Take 3 tablets (1,500 mg total) by mouth at bedtime.  Notes to Patient:  Irritability and impulsivity      furosemide 20 MG tablet  Commonly known as:  LASIX  Take by mouth.  Notes to Patient:  Fluid retention      insulin glargine 100 UNIT/ML injection  Commonly known as:  LANTUS  Inject 0.12 mLs (12 Units total) into the skin at bedtime.  Notes to Patient:  Diabetes      lamoTRIgine 200 MG tablet  Commonly known as:  LAMICTAL  Take 1 tablet (200 mg total) by mouth at bedtime.  Notes to Patient:  Depression      lisinopril 5 MG tablet  Commonly known as:  PRINIVIL,ZESTRIL  Take by mouth.  Notes to Patient:  High blood pressure      LORazepam 1 MG tablet  Commonly known as:  ATIVAN  Take 1 tablet (1 mg total) by mouth 3 (three) times daily with meals as needed (agitation).  Notes to Patient:  As needed for agitation      metoprolol succinate 50 MG 24 hr tablet   Commonly known as:  TOPROL-XL  Take by mouth.  Notes to Patient:  Blood pressure      MIRALAX powder  Generic drug:  polyethylene glycol powder  Take 1 Container by mouth once.  Notes to Patient:  Constipation      QUEtiapine 100 MG tablet  Commonly known as:  SEROQUEL  Take 1 tablet (100 mg total) by mouth 2 (two) times daily.  Notes to Patient:  Depression, anxiety      QUEtiapine 400 MG tablet  Commonly known as:  SEROQUEL  Take 1 tablet (400 mg total) by mouth at bedtime.  Notes to Patient:  Depression, anxiety      sertraline 100 MG tablet  Commonly known as:  ZOLOFT  Take two tablets (243m) by mouth once daily  Notes to Patient:  Depression and anxiety      simvastatin 20 MG tablet  Commonly known as:  ZOCOR  Take 20 mg by mouth daily.  Notes to Patient:  Cholesterol      sitaGLIPtin 100 MG tablet  Commonly known as:  JANUVIA  Take 100 mg by mouth daily.  Notes to Patient:  Diabetes      solifenacin 5 MG tablet  Commonly known as:  VESICARE  Take 5 mg by mouth daily.  Notes to Patient:  Urinary incontinence      tamsulosin 0.4 MG Caps capsule  Commonly known as:  FLOMAX  Take 0.4 mg by mouth.  Notes to Patient:  Enlarged prostate      VITAMIN D-1000 MAX ST 1000 UNITS tablet  Generic drug:  Cholecalciferol  Take 5,000 Units by mouth.  Notes to Patient:  Vitamin D deficiency            Total Discharge Time: >30 minutes  Signed: HHildred Priest10/01/2015, 9:32 AM

## 2015-04-17 NOTE — [facility] Group Notes (Signed)
[facility] LCSW Aftercare Discharge Planning Group Note   04/17/2015 2:22 PM  Participation Quality: Active    Mood/Affect:  Depressed  Depression Rating:  10  Anxiety Rating:  10  Thoughts of Suicide:  No Will you contract for safety?   NA  Current AVH:  NA  Plan for Discharge/Comments:  Pt is unhappy about going back to his group home. He states they do not have enough time for him and that they do not allow him to walk around town. He states he feels hopeless.   Transportation Means: Group home   Supports: [place] MSW, [employer]

## 2015-04-17 NOTE — Plan of Care (Signed)
Problem: Alteration in mood; excessive anxiety as evidenced by: Goal: LTG-Patient's behavior demonstrates decreased anxiety (Patient's behavior demonstrates anxiety and he/she is utilizing learned coping skills to deal with anxiety-producing situations)  Outcome: Progressing Patient appropriately interactive in the milieu, coloring, pleasant, and cooperative.  Goal: STG-Patient can identify triggers for anxiety Outcome: Progressing Patient able to identify stress triggers.  Goal: STG-Pt can identify coping skills to manage panic/anxiety (Patient can identify at least ____ coping skills to manage panic/anxiety attack)  Outcome: Progressing Patient colors / draws pictures, seeks clinical staff to speak with when upset, and stays in dayroom around peers, as he reports enjoying being around people.

## 2015-04-17 NOTE — Progress Notes (Signed)
  [facility] Adult Case Management Discharge Plan :  Will you be returning to the same living situation after discharge:  Yes,  patient will discharge home with his brother [name] for the weekend and will return to the group home on Monday 04/20/15 At discharge, do you have transportation home?: Yes,  patient's brother will pick up after family meeting Do you have the ability to pay for your medications: Yes,  paitent has insurance  Release of information consent forms completed and in the chart;  Patient's signature needed at discharge.  Patient to Follow up at: Follow-up Information    [facility] with  [facility][name]. Go on 04/23/2015.   Why:  For follow-up care appt Thursday 04/23/15 at 3:00pm   Contact information:   [address] [place], [place] [address] Ph [phone] Fax [phone]       Patient denies SI/HI: Yes,  patient denies SI/HI    Safety Planning and Suicide Prevention discussed: Yes,  SPE discussed with patient and his brother Javarion Douty [phone]  Have you used any form of tobacco in the last 30 days? (Cigarettes, Smokeless Tobacco, Cigars, and/or Pipes): No  Has patient been referred to the Quitline?: N/A patient is not a smoker  Keene Breath, MSW, LCSWA 04/17/2015, 2:47 PM

## 2015-04-17 NOTE — [facility] Suicide Risk Assessment (Signed)
[place] INPATIENT:  Family/Significant Other Suicide Prevention Education  Suicide Prevention Education:  Education Completed; Bohdan Macho (brother) [phone] has been identified by the patient as the family member/significant other with whom the patient will be residing, and identified as the person(s) who will aid the patient in the event of a mental health crisis (suicidal ideations/suicide attempt).  With written consent from the patient, the family member/significant other has been provided the following suicide prevention education, prior to the and/or following the discharge of the patient.  The suicide prevention education provided includes the following:  Suicide risk factors  Suicide prevention and interventions  National Suicide Hotline telephone number  [facility] assessment telephone number  [facility] Emergency Assistance [place] and/or Residential Mobile Crisis Unit telephone number  Request made of family/significant other to:  Remove weapons (e.g., guns, rifles, knives), all items previously/currently identified as safety concern.    Remove drugs/medications (over-the-counter, prescriptions, illicit drugs), all items previously/currently identified as a safety concern.  The family member/significant other verbalizes understanding of the suicide prevention education information provided.  The family member/significant other agrees to remove the items of safety concern listed above.  Keene Breath, MSW, LCSWA 04/17/2015, 2:46 PM

## 2015-04-23 ENCOUNTER — Emergency Department
Admission: EM | Admit: 2015-04-23 | Discharge: 2015-04-24 | Disposition: A | Discharge status: Other Acute Inpatient Hospital | Payer: Medicare Other | Attending: Emergency Medicine | Admitting: Emergency Medicine

## 2015-04-23 ENCOUNTER — Encounter: Payer: Self-pay | Admitting: Emergency Medicine

## 2015-04-23 ENCOUNTER — Ambulatory Visit: Payer: Self-pay | Admitting: Psychiatry

## 2015-04-23 DIAGNOSIS — Z880 Allergy status to penicillin: Secondary | ICD-10-CM | POA: Insufficient documentation

## 2015-04-23 DIAGNOSIS — F840 Autistic disorder: Secondary | ICD-10-CM | POA: Diagnosis present

## 2015-04-23 DIAGNOSIS — Z046 Encounter for general psychiatric examination, requested by authority: Secondary | ICD-10-CM | POA: Diagnosis present

## 2015-04-23 DIAGNOSIS — I10 Essential (primary) hypertension: Secondary | ICD-10-CM | POA: Diagnosis not present

## 2015-04-23 DIAGNOSIS — F259 Schizoaffective disorder, unspecified: Secondary | ICD-10-CM

## 2015-04-23 DIAGNOSIS — E119 Type 2 diabetes mellitus without complications: Secondary | ICD-10-CM | POA: Diagnosis not present

## 2015-04-23 DIAGNOSIS — N400 Enlarged prostate without lower urinary tract symptoms: Secondary | ICD-10-CM | POA: Diagnosis present

## 2015-04-23 DIAGNOSIS — Z79899 Other long term (current) drug therapy: Secondary | ICD-10-CM | POA: Insufficient documentation

## 2015-04-23 DIAGNOSIS — F250 Schizoaffective disorder, bipolar type: Secondary | ICD-10-CM | POA: Diagnosis not present

## 2015-04-23 DIAGNOSIS — R45851 Suicidal ideations: Secondary | ICD-10-CM | POA: Insufficient documentation

## 2015-04-23 DIAGNOSIS — Z794 Long term (current) use of insulin: Secondary | ICD-10-CM | POA: Insufficient documentation

## 2015-04-23 LAB — CBC
HCT: 36.3 % — ABNORMAL LOW (ref 40.0–52.0)
Hemoglobin: 12.3 g/dL — ABNORMAL LOW (ref 13.0–18.0)
MCH: 32.2 pg (ref 26.0–34.0)
MCHC: 33.8 g/dL (ref 32.0–36.0)
MCV: 95.3 fL (ref 80.0–100.0)
Platelets: 126 10*3/uL — ABNORMAL LOW (ref 150–440)
RBC: 3.81 MIL/uL — ABNORMAL LOW (ref 4.40–5.90)
RDW: 12.9 % (ref 11.5–14.5)
WBC: 5.3 10*3/uL (ref 3.8–10.6)

## 2015-04-23 LAB — COMPREHENSIVE METABOLIC PANEL WITH GFR
ALT: 8 U/L — ABNORMAL LOW (ref 17–63)
AST: 24 U/L (ref 15–41)
Albumin: 3.5 g/dL (ref 3.5–5.0)
Alkaline Phosphatase: 44 U/L (ref 38–126)
Anion gap: 6 (ref 5–15)
BUN: 19 mg/dL (ref 6–20)
CO2: 26 mmol/L (ref 22–32)
Calcium: 8.8 mg/dL — ABNORMAL LOW (ref 8.9–10.3)
Chloride: 103 mmol/L (ref 101–111)
Creatinine, Ser: 0.88 mg/dL (ref 0.61–1.24)
GFR calc Af Amer: >60 mL/min
GFR calc non Af Amer: >60 mL/min
Glucose, Bld: 141 mg/dL — ABNORMAL HIGH (ref 65–99)
Potassium: 4.2 mmol/L (ref 3.5–5.1)
Sodium: 135 mmol/L (ref 135–145)
Total Bilirubin: 0.6 mg/dL (ref 0.3–1.2)
Total Protein: 6.2 g/dL — ABNORMAL LOW (ref 6.5–8.1)

## 2015-04-23 LAB — SALICYLATE LEVEL: Salicylate Lvl: <4 mg/dL (ref 2.8–30.0)

## 2015-04-23 LAB — ETHANOL: Alcohol, Ethyl (B): <5 mg/dL

## 2015-04-23 LAB — ACETAMINOPHEN LEVEL

## 2015-04-23 MED ORDER — SERTRALINE HCL 100 MG PO TABS
200.0000 mg | ORAL_TABLET | Freq: Every day | ORAL | Status: DC
  Administered 2015-04-23 – 2015-04-24 (×2): 200 mg via ORAL
  Filled 2015-04-23 (×2): qty 2

## 2015-04-23 MED ORDER — LINAGLIPTIN 5 MG PO TABS
5.0000 mg | ORAL_TABLET | Freq: Every day | ORAL | Status: DC
  Administered 2015-04-23 – 2015-04-24 (×2): 5 mg via ORAL
  Filled 2015-04-23 (×2): qty 1

## 2015-04-23 MED ORDER — TAMSULOSIN HCL 0.4 MG PO CAPS
0.4000 mg | ORAL_CAPSULE | Freq: Every day | ORAL | Status: DC
  Administered 2015-04-23 – 2015-04-24 (×2): 0.4 mg via ORAL
  Filled 2015-04-23 (×2): qty 1

## 2015-04-23 MED ORDER — LORAZEPAM 1 MG PO TABS
1.0000 mg | ORAL_TABLET | Freq: Three times a day (TID) | ORAL | Status: DC
  Administered 2015-04-23 – 2015-04-24 (×3): 1 mg via ORAL
  Filled 2015-04-23 (×3): qty 1

## 2015-04-23 MED ORDER — INSULIN GLARGINE 100 UNIT/ML ~~LOC~~ SOLN
12.0000 [IU] | Freq: Every day | SUBCUTANEOUS | Status: DC
  Administered 2015-04-23: 12 [IU] via SUBCUTANEOUS
  Filled 2015-04-23 (×3): qty 0.12

## 2015-04-23 MED ORDER — ZIPRASIDONE MESYLATE 20 MG IM SOLR
20.0000 mg | Freq: Once | INTRAMUSCULAR | Status: AC
  Administered 2015-04-23: 20 mg via INTRAMUSCULAR
  Filled 2015-04-23: qty 20

## 2015-04-23 MED ORDER — DARIFENACIN HYDROBROMIDE ER 7.5 MG PO TB24
7.5000 mg | ORAL_TABLET | Freq: Every day | ORAL | Status: DC
Start: 2015-04-23 — End: 2015-04-24
  Administered 2015-04-23: 7.5 mg via ORAL
  Filled 2015-04-23 (×2): qty 1

## 2015-04-23 MED ORDER — DIVALPROEX SODIUM 500 MG PO DR TAB
1500.0000 mg | DELAYED_RELEASE_TABLET | Freq: Every day | ORAL | Status: DC
  Administered 2015-04-23: 1500 mg via ORAL
  Filled 2015-04-23 (×2): qty 3

## 2015-04-23 MED ORDER — LAMOTRIGINE 100 MG PO TABS
200.0000 mg | ORAL_TABLET | Freq: Every day | ORAL | Status: DC
  Administered 2015-04-23: 200 mg via ORAL
  Filled 2015-04-23: qty 2

## 2015-04-23 MED ORDER — QUETIAPINE FUMARATE 25 MG PO TABS
100.0000 mg | ORAL_TABLET | Freq: Two times a day (BID) | ORAL | Status: DC
  Administered 2015-04-23 – 2015-04-24 (×2): 100 mg via ORAL

## 2015-04-23 MED ORDER — CLONAZEPAM 0.5 MG PO TABS
0.5000 mg | ORAL_TABLET | Freq: Three times a day (TID) | ORAL | Status: DC
  Administered 2015-04-23 – 2015-04-24 (×4): 0.5 mg via ORAL
  Filled 2015-04-23 (×4): qty 1

## 2015-04-23 MED ORDER — SIMVASTATIN 40 MG PO TABS
20.0000 mg | ORAL_TABLET | Freq: Every day | ORAL | Status: DC
  Administered 2015-04-23: 20 mg via ORAL
  Filled 2015-04-23: qty 1

## 2015-04-23 MED ORDER — QUETIAPINE FUMARATE 200 MG PO TABS
400.0000 mg | ORAL_TABLET | Freq: Every day | ORAL | Status: DC
  Administered 2015-04-23: 400 mg via ORAL
  Filled 2015-04-23: qty 2

## 2015-04-23 MED ORDER — FUROSEMIDE 20 MG PO TABS
20.0000 mg | ORAL_TABLET | Freq: Every day | ORAL | Status: DC
  Administered 2015-04-23: 20 mg via ORAL
  Filled 2015-04-23: qty 1

## 2015-04-23 MED ORDER — METOPROLOL SUCCINATE ER 50 MG PO TB24
50.0000 mg | ORAL_TABLET | Freq: Every day | ORAL | Status: DC
  Administered 2015-04-23 – 2015-04-24 (×2): 50 mg via ORAL
  Filled 2015-04-23 (×2): qty 1

## 2015-04-23 MED ORDER — POLYETHYLENE GLYCOL 3350 17 G PO PACK
17.0000 g | PACK | Freq: Every day | ORAL | Status: DC
  Administered 2015-04-24: 17 g via ORAL
  Filled 2015-04-23: qty 1

## 2015-04-23 NOTE — ED Notes (Signed)
BEHAVIORAL HEALTH ROUNDING Patient sleeping: No. Patient alert and oriented: yes Behavior appropriate: Yes.  ; If no, describe:  Nutrition and fluids offered: Yes  Toileting and hygiene offered: Yes  Sitter present: yes Law enforcement present: Yes  

## 2015-04-23 NOTE — ED Provider Notes (Addendum)
[facility] Emergency Department Provider Note  Time seen: 3:05 PM  I have reviewed the triage vital signs and the nursing notes.   HISTORY  Chief Complaint behavioral medicine     HPI Alexander Beard is a 59 y.o. male with a past medical history of schizophrenia, autism, anxiety, diabetes, hypertension, developmental disorder, presents the emergency department with suicidal ideation.  The patient was at his dentist, when he got up and ran out into the street saying he was going to hurt himself because he didn't want to go back to his group home because they don't treat him right. Patient admits this, states he hates his group home and does not want to go back. Denies any medical complaints today.    Past Medical History  Diagnosis Date  . Schizophrenia, schizoaffective ([place])   . Autistic disorder, residual state   . Anxiety disorder due to known physiological condition   . Anxiety   . Esophageal reflux   . Diabetes mellitus without complication ([place])   . Hypertension   . Developmental disorder   . Arthritis   . Dyslipidemia   . Sleep apnea   . Overactive bladder   . Urinary incontinence     Patient Active Problem List   Diagnosis Date Noted  . Diabetes ([place]) 04/10/2015  . Hypertension 04/10/2015  . Prostate hypertrophy 04/10/2015  . Schizoaffective disorder ([place]) 04/10/2015  . Schizoaffective disorder, bipolar type ([place]) 12/29/2014  . Active autistic disorder 12/29/2014    History reviewed. No pertinent past surgical history.  Current Outpatient Rx  Name  Route  Sig  Dispense  Refill  . Cholecalciferol (VITAMIN D-1000 MAX ST) 1000 UNITS tablet   Oral   Take 5,000 Units by mouth.          . clonazePAM (KLONOPIN) 0.5 MG tablet   Oral   Take 1 tablet (0.5 mg total) by mouth 3 (three) times daily.   90 tablet   0   . divalproex (DEPAKOTE ER) 500 MG 24 hr tablet   Oral   Take 3 tablets (1,500 mg total) by mouth at bedtime.   90  tablet   0   . furosemide (LASIX) 20 MG tablet   Oral   Take by mouth.         . insulin glargine (LANTUS) 100 UNIT/ML injection   Subcutaneous   Inject 0.12 mLs (12 Units total) into the skin at bedtime.   30 mL   0   . lamoTRIgine (LAMICTAL) 200 MG tablet   Oral   Take 1 tablet (200 mg total) by mouth at bedtime.   30 tablet   3   . lisinopril (PRINIVIL,ZESTRIL) 5 MG tablet   Oral   Take by mouth.         Marland Kitchen LORazepam (ATIVAN) 1 MG tablet   Oral   Take 1 tablet (1 mg total) by mouth 3 (three) times daily with meals as needed (agitation).   30 tablet   0   . metoprolol succinate (TOPROL-XL) 50 MG 24 hr tablet   Oral   Take by mouth.         . polyethylene glycol powder (MIRALAX) powder   Oral   Take 1 Container by mouth once.         Marland Kitchen QUEtiapine (SEROQUEL) 100 MG tablet   Oral   Take 1 tablet (100 mg total) by mouth 2 (two) times daily.   60 tablet   0  Give in am and at 3 pm   . QUEtiapine (SEROQUEL) 400 MG tablet   Oral   Take 1 tablet (400 mg total) by mouth at bedtime.   30 tablet   0   . sertraline (ZOLOFT) 100 MG tablet      Take two tablets (200mg ) by mouth once daily   60 tablet   3   . simvastatin (ZOCOR) 20 MG tablet   Oral   Take 20 mg by mouth daily.         . sitaGLIPtin (JANUVIA) 100 MG tablet   Oral   Take 100 mg by mouth daily.         . solifenacin (VESICARE) 5 MG tablet   Oral   Take 5 mg by mouth daily.         . tamsulosin (FLOMAX) 0.4 MG CAPS capsule   Oral   Take 0.4 mg by mouth.           Allergies Other and Penicillins  Family History  Problem Relation Age of Onset  . Family history unknown: Yes    Social History Social History  Substance Use Topics  . Smoking status: Never Smoker   . Smokeless tobacco: Never Used  . Alcohol Use: No    Review of Systems Constitutional: Negative for fever. Cardiovascular: Negative for chest pain. Respiratory: Negative for shortness of  breath. Gastrointestinal: Negative for abdominal pain Neurological: Negative for headache 10-point ROS otherwise negative.  ____________________________________________   PHYSICAL EXAM:  VITAL SIGNS: ED Triage Vitals  Enc Vitals Group     BP 04/23/15 1406 83/48 mmHg     Pulse Rate 04/23/15 1406 63     Resp 04/23/15 1406 18     Temp 04/23/15 1406 98.4 F (36.9 C)     Temp src --      SpO2 04/23/15 1406 95 %     Weight 04/23/15 1406 222 lb (100.699 kg)     Height 04/23/15 1406 5\' 7"  (1.702 m)     Head Cir --      Peak Flow --      Pain Score 04/23/15 1413 0     Pain Loc --      Pain Edu? --      Excl. in [place]? --    Constitutional: Alert and oriented. Well appearing and in no distress. Eyes: Normal exam ENT   Head: Normocephalic and atraumatic.   Mouth/Throat: Mucous membranes are moist. Cardiovascular: Normal rate, regular rhythm. No murmur Respiratory: Normal respiratory effort without tachypnea nor retractions. Breath sounds are clear Gastrointestinal: Soft and nontender. No distention.   Musculoskeletal: Nontender with normal range of motion in all extremities. Neurologic:  Normal speech and language. No gross focal neurologic deficits Psychiatric: Mixed continued suicidal ideation because he hates his group home. ____________________________________________     INITIAL IMPRESSION / ASSESSMENT AND PLAN / ED COURSE  Pertinent labs & imaging results that were available during my care of the patient were reviewed by me and considered in my medical decision making (see chart for details).  He presents the emergency department suicidal ideation after running out into traffic saying he was going to hurt himself because he does not like his group home. States they do not let him go outside long enough during the daytime. Patient has a learning disability/autism. No medical complaints today. We will check labs, placed under an involuntary commitment until the patient be  appropriately evaluated by psychiatry.  ____________________________________________   FINAL CLINICAL  IMPRESSION(S) / ED DIAGNOSES  Suicidal ideation   Harvest Dark, MD 04/23/15 1660  Harvest Dark, MD 04/23/15 [id number]

## 2015-04-23 NOTE — ED Notes (Signed)
Pt to ED accompanied by [employer] and crisis counselor voluntary, counselor advised pt resides in a group home and was at dentist office today and got up and ran out in the street, pt verbalizing he wanted to harm himself, also stating he wanted to leave the group home he was currently residing at, pt states "I get these feelings in my head that I want to run"

## 2015-04-23 NOTE — ED Notes (Signed)
Pt laying in room watching tv at this time calm and cooperative without any complaints.  Pt awaiting disposition, will continue to monitor.

## 2015-04-23 NOTE — ED Notes (Signed)
Pt was standing in hallway talking to officers, when he began to yell out "I don't want to go there" and "I hate Dr Weber Cooks". Officers had to restrain him from running away and brought him to private room.  Pt laid on bed and began to talk to [facility] EMT about how he hates his group home.  While in room, pt had one more outburst of yelling.

## 2015-04-23 NOTE — ED Notes (Signed)

## 2015-04-23 NOTE — Consult Note (Signed)
[facility] Face-to-Face Psychiatry Consult   Reason for Consult:  Consult for this 59 year old man with a history of schizoaffective disorder and multiple medical problems who was brought here to the emergency room after running out in the road acting crazy at a dentist appointment today Referring Physician:  Karma Greaser Patient Identification: Alexander Beard MRN:  [id number] Principal Diagnosis: Schizoaffective disorder, bipolar type [facility]) Diagnosis:   Patient Active Problem List   Diagnosis Date Noted  . Diabetes ([place]) [E11.9] 04/10/2015  . Hypertension [I10] 04/10/2015  . Prostate hypertrophy [N40.0] 04/10/2015  . Schizoaffective disorder ([place]) [F25.9] 04/10/2015  . Schizoaffective disorder, bipolar type ([place]) [F25.0] 12/29/2014  . Active autistic disorder [F84.0] 12/29/2014    Total Time spent with patient: 1 hour  Subjective:   Alexander Beard is a 59 y.o. male patient admitted with "you know what my problem is, and I just can't stand it".  HPI:  Information from the patient and the chart. Patient is well known to me as I see him in the outpatient office and have worked with him inpatient several times as well. Patient was apparently at a dentist appointment today when he jumped up and ran out into the street saying that he would refuse to go back to his group home. Here in the emergency room he tells me that he has felt like he is reaching a boiling point he just can't stand it anymore. He has the same complaints as always that he wants to be able to walk around downtown [place] without any restriction. Patient indicates that he has been taking his medicine. He is not able to indicate any particular new stress other than his chronic dissatisfaction with his group home. He tells me that if he has to go back to his group home he thinks that he will kill himself and that he will want to die. He does not make any violent threats. Patient is not reporting any current hallucinations. No evidence of  substance abuse.  Past psychiatric history: Essentially lifelong history of behavior problems carries a long-standing diagnosis of schizoaffective disorder. In my evaluation of the patient I have felt that he is probably more of a autistic spectrum patient as his problems tend to be emotional dyscontrol and cognitive rather than psychotic. Patient does not have a clear history that I know of suicide attempts. I don't think he really tries to hurt other people but can get quite agitated at times. He is on a large variety of medications which have been arrived at by trial and error and seem to of been no pulling controlling his behavior.  Social history: Patient has a legal guardian I believe in his brother. Patient is a resident of a group home. In my estimation this current group home is as good as any that he is going to find in terms of putting up with his behavior but he is still constantly argumentative with them. He has lost multiple privileges by his behavior in public.  Medical history: Diabetes. High blood pressure. Enlarged prostate.  Family history: No known family history is reported of any 1.  Substance abuse history: Patient does not have a substance abuse history.  Current medication: See intake list.  Past Psychiatric History: Patient has a history of several prior hospitalizations and agitated behavior. I don't think he is ever made a serious suicide attempt but he can get quite agitated when he is losing his temper.  Risk to Self: Is patient at risk for suicide?: Yes Risk  to Others:   Prior Inpatient Therapy:   Prior Outpatient Therapy:    Past Medical History:  Past Medical History  Diagnosis Date  . Schizophrenia, schizoaffective ([place])   . Autistic disorder, residual state   . Anxiety disorder due to known physiological condition   . Anxiety   . Esophageal reflux   . Diabetes mellitus without complication ([place])   . Hypertension   . Developmental disorder   .  Arthritis   . Dyslipidemia   . Sleep apnea   . Overactive bladder   . Urinary incontinence    History reviewed. No pertinent past surgical history. Family History:  Family History  Problem Relation Age of Onset  . Family history unknown: Yes   Family Psychiatric  History: There is no known family history of mental illness Social History:  History  Alcohol Use No     History  Drug Use No    Social History   Social History  . Marital Status: Single    Spouse Name: N/A  . Number of Children: N/A  . Years of Education: N/A   Social History Main Topics  . Smoking status: Never Smoker   . Smokeless tobacco: Never Used  . Alcohol Use: No  . Drug Use: No  . Sexual Activity: No   Other Topics Concern  . None   Social History Narrative   The patient never finished high school but did get his GED. 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 He is currently in disability and [name] is his legal guardian. He has been living in group homes for many years.      No pending legal charges   Additional Social History:                          Allergies:   Allergies  Allergen Reactions  . Other Other (See Comments)    MYACINS  . Penicillins Nausea And Vomiting    Labs:  Results for orders placed or performed during the hospital encounter of 04/23/15 (from the past 48 hour(s))  Comprehensive metabolic panel     Status: Abnormal   Collection Time: 04/23/15  2:16 PM  Result Value Ref Range   Sodium 135 135 - 145 mmol/L   Potassium 4.2 3.5 - 5.1 mmol/L   Chloride 103 101 - 111 mmol/L   CO2 26 22 - 32 mmol/L   Glucose, Bld 141 (H) 65 - 99 mg/dL   BUN 19 6 - 20 mg/dL   Creatinine, Ser 0.88 0.61 - 1.24 mg/dL   Calcium 8.8 (L) 8.9 - 10.3 mg/dL   Total Protein 6.2 (L) 6.5 - 8.1 g/dL   Albumin 3.5 3.5 - 5.0 g/dL   AST 24 15 - 41 U/L   ALT 8 (L) 17 - 63 U/L   Alkaline Phosphatase 44 38 - 126 U/L    Total Bilirubin 0.6 0.3 - 1.2 mg/dL   GFR calc non Af Amer >60 >60 mL/min   GFR calc Af Amer >60 >60 mL/min    Comment: (NOTE) The eGFR has been calculated using the CKD EPI equation. This calculation has not been validated in all clinical situations. eGFR's persistently <60 mL/min signify possible Chronic Kidney Disease.    Anion gap 6 5 - 15  Ethanol (ETOH)     Status: None   Collection Time: 04/23/15  2:16 PM  Result  Value Ref Range   Alcohol, Ethyl (B) <5 <5 mg/dL    Comment:        LOWEST DETECTABLE LIMIT FOR SERUM ALCOHOL IS 5 mg/dL FOR MEDICAL PURPOSES ONLY   Salicylate level     Status: None   Collection Time: 04/23/15  2:16 PM  Result Value Ref Range   Salicylate Lvl <7.6 2.8 - 30.0 mg/dL  Acetaminophen level     Status: Abnormal   Collection Time: 04/23/15  2:16 PM  Result Value Ref Range   Acetaminophen (Tylenol), Serum <10 (L) 10 - 30 ug/mL    Comment:        THERAPEUTIC CONCENTRATIONS VARY SIGNIFICANTLY. A RANGE OF 10-30 ug/mL MAY BE AN EFFECTIVE CONCENTRATION FOR MANY PATIENTS. HOWEVER, SOME ARE BEST TREATED AT CONCENTRATIONS OUTSIDE THIS RANGE. ACETAMINOPHEN CONCENTRATIONS >150 ug/mL AT 4 HOURS AFTER INGESTION AND >50 ug/mL AT 12 HOURS AFTER INGESTION ARE OFTEN ASSOCIATED WITH TOXIC REACTIONS.   CBC     Status: Abnormal   Collection Time: 04/23/15  2:16 PM  Result Value Ref Range   WBC 5.3 3.8 - 10.6 K/uL   RBC 3.81 (L) 4.40 - 5.90 MIL/uL   Hemoglobin 12.3 (L) 13.0 - 18.0 g/dL   HCT 36.3 (L) 40.0 - 52.0 %   MCV 95.3 80.0 - 100.0 fL   MCH 32.2 26.0 - 34.0 pg   MCHC 33.8 32.0 - 36.0 g/dL   RDW 12.9 11.5 - 14.5 %   Platelets 126 (L) 150 - 440 K/uL    Current Facility-Administered Medications  Medication Dose Route Frequency Provider Last Rate Last Dose  . clonazePAM (KLONOPIN) tablet 0.5 mg  0.5 mg Oral TID Gonzella Lex, MD      . darifenacin (ENABLEX) 24 hr tablet 7.5 mg  7.5 mg Oral Daily Gonzella Lex, MD      . divalproex (DEPAKOTE) DR  tablet 1,500 mg  1,500 mg Oral QHS Gonzella Lex, MD      . furosemide (LASIX) tablet 20 mg  20 mg Oral Daily Shanetta Nicolls T Nakesha Ebrahim, MD      . insulin glargine (LANTUS) injection 12 Units  12 Units Subcutaneous QHS Gonzella Lex, MD      . lamoTRIgine (LAMICTAL) tablet 200 mg  200 mg Oral QHS Gonzella Lex, MD      . linagliptin (TRADJENTA) tablet 5 mg  5 mg Oral Daily Quasean Frye T Chrisoula Zegarra, MD      . LORazepam (ATIVAN) tablet 1 mg  1 mg Oral TID Gonzella Lex, MD      . metoprolol succinate (TOPROL-XL) 24 hr tablet 50 mg  50 mg Oral Daily Daven Pinckney T Sarrinah Gardin, MD      . polyethylene glycol (MIRALAX / GLYCOLAX) packet 17 g  17 g Oral Daily James Lafalce T [employer] Null, MD      . QUEtiapine (SEROQUEL) tablet 100 mg  100 mg Oral BID Gonzella Lex, MD      . QUEtiapine (SEROQUEL) tablet 400 mg  400 mg Oral QHS Soraya Paquette T Kierah Goatley, MD      . sertraline (ZOLOFT) tablet 200 mg  200 mg Oral Daily Gonzella Lex, MD      . simvastatin (ZOCOR) tablet 20 mg  20 mg Oral q1800 Gonzella Lex, MD      . tamsulosin (FLOMAX) capsule 0.4 mg  0.4 mg Oral Daily Gonzella Lex, MD       Current Outpatient Prescriptions  Medication Sig Dispense Refill  . Cholecalciferol (VITAMIN  D-1000 MAX ST) 1000 UNITS tablet Take 5,000 Units by mouth.     . clonazePAM (KLONOPIN) 0.5 MG tablet Take 1 tablet (0.5 mg total) by mouth 3 (three) times daily. 90 tablet 0  . divalproex (DEPAKOTE ER) 500 MG 24 hr tablet Take 3 tablets (1,500 mg total) by mouth at bedtime. 90 tablet 0  . furosemide (LASIX) 20 MG tablet Take by mouth.    . insulin glargine (LANTUS) 100 UNIT/ML injection Inject 0.12 mLs (12 Units total) into the skin at bedtime. 30 mL 0  . lamoTRIgine (LAMICTAL) 200 MG tablet Take 1 tablet (200 mg total) by mouth at bedtime. 30 tablet 3  . lisinopril (PRINIVIL,ZESTRIL) 5 MG tablet Take by mouth.    Marland Kitchen LORazepam (ATIVAN) 1 MG tablet Take 1 tablet (1 mg total) by mouth 3 (three) times daily with meals as needed (agitation). 30 tablet 0  . metoprolol  succinate (TOPROL-XL) 50 MG 24 hr tablet Take by mouth.    . polyethylene glycol powder (MIRALAX) powder Take 1 Container by mouth once.    Marland Kitchen QUEtiapine (SEROQUEL) 100 MG tablet Take 1 tablet (100 mg total) by mouth 2 (two) times daily. 60 tablet 0  . QUEtiapine (SEROQUEL) 400 MG tablet Take 1 tablet (400 mg total) by mouth at bedtime. 30 tablet 0  . sertraline (ZOLOFT) 100 MG tablet Take two tablets (24m) by mouth once daily 60 tablet 3  . simvastatin (ZOCOR) 20 MG tablet Take 20 mg by mouth daily.    . sitaGLIPtin (JANUVIA) 100 MG tablet Take 100 mg by mouth daily.    . solifenacin (VESICARE) 5 MG tablet Take 5 mg by mouth daily.    . tamsulosin (FLOMAX) 0.4 MG CAPS capsule Take 0.4 mg by mouth.      Musculoskeletal: Strength & Muscle Tone: within normal limits Gait & Station: normal Patient leans: N/A  Psychiatric Specialty Exam: Review of Systems  Constitutional: Negative.   HENT: Negative.   Eyes: Negative.   Respiratory: Negative.   Cardiovascular: Negative.   Gastrointestinal: Negative.   Musculoskeletal: Negative.   Skin: Negative.   Neurological: Negative.   Psychiatric/Behavioral: Positive for depression and suicidal ideas. Negative for hallucinations, memory loss and substance abuse. The patient is nervous/anxious and has insomnia.     Blood pressure 100/65, pulse 57, temperature 98 F (36.7 C), temperature source Oral, resp. rate 18, height 5' 7"  (1.702 m), weight 100.699 kg (222 lb), SpO2 97 %.Body mass index is 34.76 kg/(m^2).  General Appearance: Casual  Eye Contact::  Good  Speech:  Pressured  Volume:  Increased  Mood:  Irritable  Affect:  Labile  Thought Process:  Circumstantial and Tangential  Orientation:  Full (Time, Place, and Person)  Thought Content:  Obsessions  Suicidal Thoughts:  Yes.  without intent/plan  Homicidal Thoughts:  No  Memory:  Immediate;   Fair Recent;   Fair Remote;   Fair  Judgement:  Impaired  Insight:  Shallow  Psychomotor  Activity:  Normal  Concentration:  Poor  Recall:  [employer] Corporationof Knowledge:Fair  Language: Fair  Akathisia:  No  Handed:  Right  AIMS (if indicated):     Assets:  Communication Skills Desire for Improvement Financial Resources/Insurance Housing Intimacy Leisure Time Social Support  ADL's:  Intact  Cognition: Impaired,  Mild  Sleep:      Treatment Plan Summary: Daily contact with patient to assess and evaluate symptoms and progress in treatment, Medication management and Plan I made attempts to get the  patient calm down and offered a lot of sympathy to him. Nevertheless he is still extremely agitated and refuses to go back to his group home making suicidal statements and acting out with temper tantrums. Right now he is not safe to go back to his group home. I will continue all of his current medicines. If we cannot get him back to his baseline I wouldn't encourage consideration of referral to the state psychiatric hospital as he may no longer be manageable as an outpatient. Reviewed. Chart reviewed. Old notes reviewed. Vital signs reviewed. Case discussed with emergency room team.  Disposition: Supportive therapy provided about ongoing stressors. Discussed crisis plan, support from social network, calling 911, coming to the Emergency Department, and calling Suicide Hotline.  Jerrian Mells 04/23/2015 5:20 PM

## 2015-04-23 NOTE — ED Provider Notes (Signed)
-----------------------------------------   4:34 PM on 04/23/2015 -----------------------------------------  The patient just had an acute episode of screaming and physical aggression.  He required physical restraint and redirection into an exam room.  I spoke by phone with [name] regarding the situation and he encouraged the use of Geodon 20 mg intramuscular for the safety of the patient and staff.  We are proceeding with administration of the medication at this time.  Hinda Kehr, MD 04/24/15 [phone]

## 2015-04-23 NOTE — ED Notes (Signed)
Report received from [facility]. Patient care assumed. Patient/RN introduction complete. Will continue to monitor.

## 2015-04-23 NOTE — ED Notes (Signed)

## 2015-04-23 NOTE — ED Notes (Signed)
Pt given meal tray.

## 2015-04-23 NOTE — ED Notes (Signed)
TTS in to eval 

## 2015-04-23 NOTE — ED Notes (Signed)
ED BHU [place] Is the patient under IVC or is there intent for IVC: Yes.   Is the patient medically cleared: Yes.   Is there vacancy in the ED BHU: Yes.   Is the population mix appropriate for patient: Yes.   Is the patient awaiting placement in inpatient or outpatient setting: Yes.   Has the patient had a psychiatric consult: Yes.   Survey of unit performed for contraband, proper placement and condition of furniture, tampering with fixtures in bathroom, shower, and each patient room: Yes.  ; Findings:  APPEARANCE/BEHAVIOR calm, cooperative and adequate rapport can be established NEURO ASSESSMENT Orientation: time, place and person Hallucinations: No.None noted (Hallucinations) Speech: Normal Gait: normal RESPIRATORY ASSESSMENT Normal expansion.  Clear to auscultation.  No rales, rhonchi, or wheezing. CARDIOVASCULAR ASSESSMENT regular rate and rhythm, S1, S2 normal, no murmur, click, rub or gallop GASTROINTESTINAL ASSESSMENT soft, nontender, BS WNL, no r/g EXTREMITIES no deformities PLAN OF CARE Provide calm/safe environment. Vital signs assessed twice daily. ED BHU Assessment once each 12-hour shift. Collaborate with intake RN daily or as condition indicates. Assure the ED provider has rounded once each shift. Provide and encourage hygiene. Provide redirection as needed. Assess for escalating behavior; address immediately and inform ED provider.  Assess family dynamic and appropriateness for visitation as needed: Yes.  ; If necessary, describe findings:  Educate the patient/family about BHU procedures/visitation: Yes.  ; If necessary, describe findings:

## 2015-04-23 NOTE — ED Notes (Signed)
Patient resting comfortably in room. No complaints or concerns voiced. No distress or abnormal behavior noted. Will continue to monitor with security cameras. Q 15 minute rounds continue.

## 2015-04-23 NOTE — ED Notes (Signed)
PTS BROTHER WOULD LIKE to be updated in AM [phone]

## 2015-04-23 NOTE — BH Assessment (Signed)
Assessment Note  Alexander Beard is a 59 y.o. male  Presenting to the ED under IVC for suicidal ideations.  Patient was at his dentist, when he got up and ran out into the street saying he was going to hurt himself because he didn't want to go back to his group home because they don't treat him right. Patient admits this, states he hates his group home and does not want to go back.   Pt denies any A/V hallucinations.  Diagnosis: Behavior problems, SI  Past Medical History:  Past Medical History  Diagnosis Date  . Schizophrenia, schizoaffective ([place])   . Autistic disorder, residual state   . Anxiety disorder due to known physiological condition   . Anxiety   . Esophageal reflux   . Diabetes mellitus without complication ([place])   . Hypertension   . Developmental disorder   . Arthritis   . Dyslipidemia   . Sleep apnea   . Overactive bladder   . Urinary incontinence     History reviewed. No pertinent past surgical history.  Family History:  Family History  Problem Relation Age of Onset  . Family history unknown: Yes    Social History:  reports that he has never smoked. He has never used smokeless tobacco. He reports that he does not drink alcohol or use illicit drugs.  Additional Social History:  Alcohol / Drug Use History of alcohol / drug use?: No history of alcohol / drug abuse  CIWA: CIWA-Ar BP: 105/70 mmHg Pulse Rate: 63 COWS:    Allergies:  Allergies  Allergen Reactions  . Other Other (See Comments)    MYACINS  . Penicillins Nausea And Vomiting    Home Medications:  (Not in a hospital admission)  OB/GYN Status:  No LMP for male patient.  General Assessment Data Location of Assessment: [facility] ED TTS Assessment: In system Is this a Tele or Face-to-Face Assessment?: Face-to-Face Is this an Initial Assessment or a Re-assessment for this encounter?: Initial Assessment Marital status: Single Maiden name: N/A Is patient pregnant?: No Pregnancy Status:  No Living Arrangements: Group Home Can pt return to current living arrangement?: Yes Admission Status: Involuntary Is patient capable of signing voluntary admission?: No Referral Source: Self/Family/Friend Insurance type: Medicare  Medical Screening Exam ([place]) Medical Exam completed: Yes  Crisis Care Plan Living Arrangements: Group Home Name of Psychiatrist: [name] Name of Therapist: n/a  Education Status Is patient currently in school?: No Current Grade: N/A Highest grade of school patient has completed: GED Name of school: n/a Contact person: n/a  Risk to self with the past 6 months Suicidal Ideation: Yes-Currently Present Has patient been a risk to self within the past 6 months prior to admission? : No Suicidal Intent: Yes-Currently Present Has patient had any suicidal intent within the past 6 months prior to admission? : No Is patient at risk for suicide?: Yes Suicidal Plan?: No Has patient had any suicidal plan within the past 6 months prior to admission? : No Access to Means: No What has been your use of drugs/alcohol within the last 12 months?: None reported Previous Attempts/Gestures: No How many times?: 0 Other Self Harm Risks: None reported Triggers for Past Attempts: Unknown Intentional Self Injurious Behavior: None Family Suicide History: No Recent stressful life event(s): Conflict (Comment) (Conflict with group home) Persecutory voices/beliefs?: No Depression: Yes Depression Symptoms: Feeling angry/irritable, Tearfulness Substance abuse history and/or treatment for substance abuse?: No Suicide prevention information given to non-admitted patients: Not applicable  Risk  to Others within the past 6 months Homicidal Ideation: No Does patient have any lifetime risk of violence toward others beyond the six months prior to admission? : No Thoughts of Harm to Others: No Current Homicidal Intent: No Current Homicidal Plan: No Access to  Homicidal Means: No Identified Victim: None reported History of harm to others?: No Assessment of Violence: None Noted Violent Behavior Description: None reported Does patient have access to weapons?: No Criminal Charges Pending?: No Does patient have a court date: No Is patient on probation?: No  Psychosis Hallucinations: None noted Delusions: None noted  Mental Status Report Appearance/Hygiene: In scrubs Eye Contact: Good Motor Activity: Agitation Speech: Rapid, Pressured Level of Consciousness: Alert, Irritable Mood: Depressed, Anxious, Sad Affect: Anxious Anxiety Level: Panic Attacks Panic attack frequency: every day Most recent panic attack: today Thought Processes: Irrelevant Judgement: Partial Orientation: Person, Place, Situation Obsessive Compulsive Thoughts/Behaviors: None  Cognitive Functioning Concentration: Fair Memory: Recent Intact IQ: Average Insight: Poor Impulse Control: Poor Appetite: Good Weight Loss: 0 Weight Gain: 0 Sleep: Decreased Total Hours of Sleep: 4 Vegetative Symptoms: None  ADLScreening [facility] Assessment Services) Patient's cognitive ability adequate to safely complete daily activities?: Yes Patient able to express need for assistance with ADLs?: Yes Independently performs ADLs?: Yes (appropriate for developmental age)  Prior Inpatient Therapy Prior Inpatient Therapy: Yes Prior Therapy Dates: 08/2014 & 11/2012 Prior Therapy Facilty/Provider(s): [facility] Reason for Treatment: Schizoaffective  Prior Outpatient Therapy Prior Outpatient Therapy: Yes Prior Therapy Dates: Current [facility]ilty[facility] Reason for Treatment: Schizoaffective D/O Does patient have an ACCT team?: No Does patient have Intensive In-House Services?  : No Does patient have [facility] services? : No Does patient have P4CC services?: No  ADL Screening (condition at time of admission) Patient's cognitive ability adequate  to safely complete daily activities?: Yes Patient able to express need for assistance with ADLs?: Yes Independently performs ADLs?: Yes (appropriate for developmental age)       Abuse/Neglect Assessment (Assessment to be complete while patient is alone) Physical Abuse: Denies Verbal Abuse: Denies Sexual Abuse: Denies Exploitation of patient/patient's resources: Denies Self-Neglect: Denies Values / Beliefs Cultural Requests During Hospitalization: None Spiritual Requests During Hospitalization: None Consults Spiritual Care Consult Needed: No Social Work Consult Needed: No      Additional Information 1:1 In Past 12 Months?: No CIRT Risk: No Elopement Risk: No Does patient have medical clearance?: Yes     Disposition:  Disposition Initial Assessment Completed for this Encounter: Yes Disposition of Patient: Other dispositions Type of inpatient treatment program: Adult Other disposition(s): Other (Comment) (Psych MD consult)  On Site Evaluation by:   Reviewed with Physician:    Oneita Hurt 04/23/2015 10:56 PM

## 2015-04-23 NOTE — ED Notes (Signed)
BEHAVIORAL HEALTH ROUNDING Patient sleeping: Yes.   Patient alert and oriented: no Behavior appropriate: Yes.  ; If no, describe:  Nutrition and fluids offered: No Toileting and hygiene offered: No Sitter present: yes Law enforcement present: Yes  

## 2015-04-24 DIAGNOSIS — F259 Schizoaffective disorder, unspecified: Secondary | ICD-10-CM | POA: Diagnosis not present

## 2015-04-24 MED ORDER — QUETIAPINE FUMARATE 200 MG PO TABS
ORAL_TABLET | ORAL | Status: AC
  Administered 2015-04-24: 100 mg via ORAL
  Filled 2015-04-24: qty 1

## 2015-04-24 NOTE — ED Notes (Signed)
Patient resting comfortably in room. No complaints or concerns voiced. No distress or abnormal behavior noted. Will continue to monitor with security cameras. Q 15 minute rounds continue.

## 2015-04-24 NOTE — ED Notes (Signed)
Patient very, very anxious. Repeating questions over and over. Not responsive to verbal reassurance. Patient did take all medications without incident. Will continue all safety precautions.

## 2015-04-24 NOTE — ED Provider Notes (Signed)
-----------------------------------------   7:21 AM on 04/24/2015 -----------------------------------------   Blood pressure 105/70, pulse 63, temperature 97.7 F (36.5 C), temperature source Oral, resp. rate 18, height 5\' 7"  (1.702 m), weight 222 lb (100.699 kg), SpO2 100 %.  The patient had no acute events since last update.  Calm and cooperative at this time.  Disposition is pending per Psychiatry/Behavioral Medicine team recommendations.     Paulette Blanch, MD 04/24/15 [phone]

## 2015-04-24 NOTE — ED Notes (Signed)

## 2015-04-24 NOTE — BHH Counselor (Signed)
Writer spoke with K[place], [place] ([phone]) and he is able to return. Information forwarded to patient's nurse ([name] H.) and ER MD (Dr. Jacqualine Code).

## 2015-04-24 NOTE — ED Notes (Signed)
Patient currently resting quietly in bed. He remains very anxious about his disposition.  Maintained on 15 minute checks and observation by security camera for safety.

## 2015-04-24 NOTE — ED Notes (Signed)
Patient sitting quietly in dayroom. He is continues to be very anxious, asking the same questions over and over. He is not responding to verbal reassurance. Able to distract himself slightly watching television.  Maintained on 15 minute checks and observation by security camera for safety.

## 2015-04-24 NOTE — ED Notes (Signed)
Patient anticipating discharge this evening. Maintained on 15 minute checks and observation by security camera for safety.

## 2015-04-24 NOTE — ED Notes (Signed)
Meal given. Patient continues to perseverate on multiple issues. Wants to know what about his disposition. He was told he needed to wait for psychiatrist. Continue all safety precautions.

## 2015-04-24 NOTE — ED Notes (Signed)
Patient in dayroom. No current complaints. Maintained on 15 minute checks and observation by security camera for safety.

## 2015-04-24 NOTE — ED Notes (Signed)
ENVIRONMENTAL ASSESSMENT Potentially harmful objects out of patient reach: Yes Personal belongings secured: Yes Patient dressed in hospital provided attire only: Yes Plastic bags out of patient reach: Yes Patient care equipment (cords, cables, call bells, lines, and drains) shortened, removed, or accounted for: Yes Equipment and supplies removed from bottom of stretcher: Yes Potentially toxic materials out of patient reach: Yes Sharps container removed or out of patient reach: Yes Patient resting quietly in room. No noted distress or abnormal behaviors noted. Will continue 15 minute checks and observation by security camera for safety. 

## 2015-04-24 NOTE — ED Provider Notes (Signed)
Patient seen and cleared for discharge by [name]. He is calm and appropriate ER at this time, and will be discharged back to the care of his group home. Return precautions and follow-up advised.  Delman Kitten, MD 04/24/15 [phone]

## 2015-04-24 NOTE — Discharge Instructions (Signed)

## 2015-04-24 NOTE — ED Notes (Signed)
Patient resting quietly in room. No noted distress or abnormal behaviors noted. Will continue 15 minute checks and observation by security camera for safety. 

## 2015-04-24 NOTE — ED Notes (Signed)
Patient is aware that he is to be discharged back to group home. His brother will be here at approx. 1730 to pick him up. Will continue all safety precautions.

## 2015-04-24 NOTE — Consult Note (Signed)
[facility] Consult   Reason for Consult:  Follow-up note for this 59 year old man with schizoaffective disorder and autistic spectrum disorder who was brought to the hospital yesterday because of losing his temper and behaving in an agitated way. Referring Physician:  Jacqualine Code Patient Identification: Alexander Beard MRN:  [id number] Principal Diagnosis: Schizoaffective disorder, bipolar type [facility]) Diagnosis:   Patient Active Problem List   Diagnosis Date Noted  . Diabetes ([place]) [E11.9] 04/10/2015  . Hypertension [I10] 04/10/2015  . Prostate hypertrophy [N40.0] 04/10/2015  . Schizoaffective disorder ([place]) [F25.9] 04/10/2015  . Schizoaffective disorder, bipolar type ([place]) [F25.0] 12/29/2014  . Active autistic disorder [F84.0] 12/29/2014    Total Time spent with patient: 30 minutes  Subjective:   Alexander Beard is a 59 y.o. male patient admitted with "I need to talk with you about how you are upsetting me".  HPI:  On follow-up today the patient primarily wants to complain about how he thinks I am "torturing" him. His belief that I am torturing him has to do with the fact that I told him that if he continues to run away from his group home he is going to have to be referred to the state hospital. He considers this a cruel threat for me to of made to him. I tried to explain that I was simply trying to give him information to help him make decisions but he is having none of it. He is rather irritated at me today but is not making any threats to harm me. He says now that he is not suicidal and is not going to harm himself. He wants to go back to his group home because he knows that his a better environment for him than staying here in the emergency room. Patient is not expressing any hallucinations or delusions. He seems to of calm down his temper fair bed and has not behaved in a violent way since he lost his temper yesterday afternoon.  Past Psychiatric History: Long history of mental  illness going back to childhood. Cares had diagnosis of schizoaffective disorder although I have long felt that he is more like a person with thoughts to stick spectrum disorder. Multiple hospitalizations. He can lose his temper and become agitated but he is not violent in a malicious or predatory way nor suicidal.  Risk to Self: Suicidal Ideation: Yes-Currently Present Suicidal Intent: Yes-Currently Present Is patient at risk for suicide?: Yes Suicidal Plan?: No Access to Means: No What has been your use of drugs/alcohol within the last 12 months?: None reported How many times?: 0 Other Self Harm Risks: None reported Triggers for Past Attempts: Unknown Intentional Self Injurious Behavior: None Risk to Others: Homicidal Ideation: No Thoughts of Harm to Others: No Current Homicidal Intent: No Current Homicidal Plan: No Access to Homicidal Means: No Identified Victim: None reported History of harm to others?: No Assessment of Violence: None Noted Violent Behavior Description: None reported Does patient have access to weapons?: No Criminal Charges Pending?: No Does patient have a court date: No Prior Inpatient Therapy: Prior Inpatient Therapy: Yes Prior Therapy Dates: 08/2014 & 11/2012 Prior Therapy Facilty/Provider(s): [facility] Reason for Treatment: Schizoaffective Prior Outpatient Therapy: Prior Outpatient Therapy: Yes Prior Therapy Dates: Current Prior Therapy Facilty/Provider(s): [facility] Reason for Treatment: Schizoaffective D/O Does patient have an ACCT team?: No Does patient have Intensive In-House Services?  : No Does patient have [facility] services? : No Does patient have P4CC services?: No  Past Medical History:  Past Medical  History  Diagnosis Date  . Schizophrenia, schizoaffective ([place])   . Autistic disorder, residual state   . Anxiety disorder due to known physiological condition   . Anxiety   . Esophageal reflux   . Diabetes mellitus without  complication ([place])   . Hypertension   . Developmental disorder   . Arthritis   . Dyslipidemia   . Sleep apnea   . Overactive bladder   . Urinary incontinence    History reviewed. No pertinent past surgical history. Family History:  Family History  Problem Relation Age of Onset  . Family history unknown: Yes   Family Psychiatric  History: There is no known history of family mental illness Social History:  History  Alcohol Use No     History  Drug Use No    Social History   Social History  . Marital Status: Single    Spouse Name: N/A  . Number of Children: N/A  . Years of Education: N/A   Social History Main Topics  . Smoking status: Never Smoker   . Smokeless tobacco: Never Used  . Alcohol Use: No  . Drug Use: No  . Sexual Activity: No   Other Topics Concern  . None   Social History Narrative   The patient never finished high school but did get his GED. 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 He is currently in disability and [name] is his legal guardian. He has been living in group homes for many years.      No pending legal charges   Additional Social History:    History of alcohol / drug use?: No history of alcohol / drug abuse                     Allergies:   Allergies  Allergen Reactions  . Other Other (See Comments)    MYACINS  . Penicillins Nausea And Vomiting    Labs:  Results for orders placed or performed during the hospital encounter of 04/23/15 (from the past 48 hour(s))  Comprehensive metabolic panel     Status: Abnormal   Collection Time: 04/23/15  2:16 PM  Result Value Ref Range   Sodium 135 135 - 145 mmol/L   Potassium 4.2 3.5 - 5.1 mmol/L   Chloride 103 101 - 111 mmol/L   CO2 26 22 - 32 mmol/L   Glucose, Bld 141 (H) 65 - 99 mg/dL   BUN 19 6 - 20 mg/dL   Creatinine, Ser 0.88 0.61 - 1.24 mg/dL   Calcium 8.8 (L) 8.9 - 10.3 mg/dL   Total Protein 6.2 (L)  6.5 - 8.1 g/dL   Albumin 3.5 3.5 - 5.0 g/dL   AST 24 15 - 41 U/L   ALT 8 (L) 17 - 63 U/L   Alkaline Phosphatase 44 38 - 126 U/L   Total Bilirubin 0.6 0.3 - 1.2 mg/dL   GFR calc non Af Amer >60 >60 mL/min   GFR calc Af Amer >60 >60 mL/min    Comment: (NOTE) The eGFR has been calculated using the CKD EPI equation. This calculation has not been validated in all clinical situations. eGFR's persistently <60 mL/min signify possible Chronic Kidney Disease.    Anion gap 6 5 - 15  Ethanol (ETOH)     Status: None   Collection Time: 04/23/15  2:16 PM  Result Value Ref Range   Alcohol, Ethyl (B) <5 <5  mg/dL    Comment:        LOWEST DETECTABLE LIMIT FOR SERUM ALCOHOL IS 5 mg/dL FOR MEDICAL PURPOSES ONLY   Salicylate level     Status: None   Collection Time: 04/23/15  2:16 PM  Result Value Ref Range   Salicylate Lvl <5.7 2.8 - 30.0 mg/dL  Acetaminophen level     Status: Abnormal   Collection Time: 04/23/15  2:16 PM  Result Value Ref Range   Acetaminophen (Tylenol), Serum <10 (L) 10 - 30 ug/mL    Comment:        THERAPEUTIC CONCENTRATIONS VARY SIGNIFICANTLY. A RANGE OF 10-30 ug/mL MAY BE AN EFFECTIVE CONCENTRATION FOR MANY PATIENTS. HOWEVER, SOME ARE BEST TREATED AT CONCENTRATIONS OUTSIDE THIS RANGE. ACETAMINOPHEN CONCENTRATIONS >150 ug/mL AT 4 HOURS AFTER INGESTION AND >50 ug/mL AT 12 HOURS AFTER INGESTION ARE OFTEN ASSOCIATED WITH TOXIC REACTIONS.   CBC     Status: Abnormal   Collection Time: 04/23/15  2:16 PM  Result Value Ref Range   WBC 5.3 3.8 - 10.6 K/uL   RBC 3.81 (L) 4.40 - 5.90 MIL/uL   Hemoglobin 12.3 (L) 13.0 - 18.0 g/dL   HCT 36.3 (L) 40.0 - 52.0 %   MCV 95.3 80.0 - 100.0 fL   MCH 32.2 26.0 - 34.0 pg   MCHC 33.8 32.0 - 36.0 g/dL   RDW 12.9 11.5 - 14.5 %   Platelets 126 (L) 150 - 440 K/uL    Current Facility-Administered Medications  Medication Dose Route Frequency Provider Last Rate Last Dose  . clonazePAM (KLONOPIN) tablet 0.5 mg  0.5 mg Oral TID Gonzella Lex, MD   0.5 mg at 04/24/15 0916  . darifenacin (ENABLEX) 24 hr tablet 7.5 mg  7.5 mg Oral Daily Gonzella Lex, MD   7.5 mg at 04/23/15 1846  . divalproex (DEPAKOTE) DR tablet 1,500 mg  1,500 mg Oral QHS Gonzella Lex, MD   1,500 mg at 04/23/15 2140  . furosemide (LASIX) tablet 20 mg  20 mg Oral Daily Gonzella Lex, MD   20 mg at 04/23/15 1847  . insulin glargine (LANTUS) injection 12 Units  12 Units Subcutaneous QHS Gonzella Lex, MD   12 Units at 04/23/15 2241  . lamoTRIgine (LAMICTAL) tablet 200 mg  200 mg Oral QHS Gonzella Lex, MD   200 mg at 04/23/15 2140  . linagliptin (TRADJENTA) tablet 5 mg  5 mg Oral Daily Gonzella Lex, MD   5 mg at 04/24/15 0917  . LORazepam (ATIVAN) tablet 1 mg  1 mg Oral TID Gonzella Lex, MD   1 mg at 04/24/15 0924  . metoprolol succinate (TOPROL-XL) 24 hr tablet 50 mg  50 mg Oral Daily Gonzella Lex, MD   50 mg at 04/24/15 0917  . polyethylene glycol (MIRALAX / GLYCOLAX) packet 17 g  17 g Oral Daily Gonzella Lex, MD   17 g at 04/24/15 0919  . QUEtiapine (SEROQUEL) tablet 100 mg  100 mg Oral BID Gonzella Lex, MD   100 mg at 04/24/15 0925  . QUEtiapine (SEROQUEL) tablet 400 mg  400 mg Oral QHS Gonzella Lex, MD   400 mg at 04/23/15 2140  . sertraline (ZOLOFT) tablet 200 mg  200 mg Oral Daily Gonzella Lex, MD   200 mg at 04/24/15 0917  . simvastatin (ZOCOR) tablet 20 mg  20 mg Oral q1800 Gonzella Lex, MD   20 mg at 04/23/15 1845  .  tamsulosin (FLOMAX) capsule 0.4 mg  0.4 mg Oral Daily Gonzella Lex, MD   0.4 mg at 04/24/15 4801   Current Outpatient Prescriptions  Medication Sig Dispense Refill  . Cholecalciferol (VITAMIN D-1000 MAX ST) 1000 UNITS tablet Take 5,000 Units by mouth.     . clonazePAM (KLONOPIN) 0.5 MG tablet Take 1 tablet (0.5 mg total) by mouth 3 (three) times daily. 90 tablet 0  . divalproex (DEPAKOTE ER) 500 MG 24 hr tablet Take 3 tablets (1,500 mg total) by mouth at bedtime. 90 tablet 0  . furosemide (LASIX) 20 MG tablet Take  by mouth.    . insulin glargine (LANTUS) 100 UNIT/ML injection Inject 0.12 mLs (12 Units total) into the skin at bedtime. 30 mL 0  . lamoTRIgine (LAMICTAL) 200 MG tablet Take 1 tablet (200 mg total) by mouth at bedtime. 30 tablet 3  . lisinopril (PRINIVIL,ZESTRIL) 5 MG tablet Take by mouth.    Marland Kitchen LORazepam (ATIVAN) 1 MG tablet Take 1 tablet (1 mg total) by mouth 3 (three) times daily with meals as needed (agitation). 30 tablet 0  . metoprolol succinate (TOPROL-XL) 50 MG 24 hr tablet Take by mouth.    . polyethylene glycol powder (MIRALAX) powder Take 1 Container by mouth once.    Marland Kitchen QUEtiapine (SEROQUEL) 100 MG tablet Take 1 tablet (100 mg total) by mouth 2 (two) times daily. 60 tablet 0  . QUEtiapine (SEROQUEL) 400 MG tablet Take 1 tablet (400 mg total) by mouth at bedtime. 30 tablet 0  . sertraline (ZOLOFT) 100 MG tablet Take two tablets (248m) by mouth once daily 60 tablet 3  . simvastatin (ZOCOR) 20 MG tablet Take 20 mg by mouth daily.    . sitaGLIPtin (JANUVIA) 100 MG tablet Take 100 mg by mouth daily.    . solifenacin (VESICARE) 5 MG tablet Take 5 mg by mouth daily.    . tamsulosin (FLOMAX) 0.4 MG CAPS capsule Take 0.4 mg by mouth.      Musculoskeletal: Strength & Muscle Tone: within normal limits Gait & Station: normal Patient leans: N/A  Psychiatric Specialty Exam: Review of Systems  Constitutional: Negative.   HENT: Negative.   Eyes: Negative.   Respiratory: Negative.   Cardiovascular: Negative.   Gastrointestinal: Negative.   Musculoskeletal: Negative.   Skin: Negative.   Neurological: Negative.   Psychiatric/Behavioral: Positive for depression. Negative for suicidal ideas, hallucinations, memory loss and substance abuse. The patient is nervous/anxious. The patient does not have insomnia.     Blood pressure 90/61, pulse 63, temperature 97.7 F (36.5 C), temperature source Oral, resp. rate 18, height 5' 7" (1.702 m), weight 100.699 kg (222 lb), SpO2 99 %.Body mass index is  34.76 kg/(m^2).  General Appearance: Casual  Eye Contact::  Good  Speech:  Clear and Coherent  Volume:  Normal  Mood:  Anxious  Affect:  Congruent  Thought Process:  Tangential  Orientation:  Full (Time, Place, and Person)  Thought Content:  Negative  Suicidal Thoughts:  No  Homicidal Thoughts:  No  Memory:  Immediate;   Good Recent;   Fair Remote;   Fair  Judgement:  Impaired  Insight:  Lacking  Psychomotor Activity:  Normal  Concentration:  Fair  Recall:  [employer] Corporationof Knowledge:Poor  Language: Fair  Akathisia:  No  Handed:  Right  AIMS (if indicated):     Assets:  Communication Skills Desire for Improvement Financial Resources/Insurance Housing Social Support  ADL's:  Intact  Cognition: Impaired,  Mild  Sleep:  Treatment Plan Summary: Medication management and Plan Today I mostly passively listened to his complaints and tried to express some reassurance. Iori is not very good at hearing information that contradicts what he wants things to be like right away. He is not acutely dangerous at this point although he remains at risk for behaviors that are disruptive and unacceptable outside the hospital such as suddenly running away from the group home or running out into the street because he is so upset. However right now he is not expressing any suicidal or homicidal ideation. He certainly agrees to continue his current medicine. I don't think he meets commitment criteria anymore and I discussed this with the emergency room doctor. If his group home is willing to take him back we will discharge him home. The patient today told me several times that he did not want me to be his psychiatrist anymore. Rather than arguing with him I again just tried to be supportive. If he and his guardian decide to change outpatient providers that is up to them but I will continue to assume that I'm going to be prescribing medication and seeing him as an outpatient.  Disposition: Patient does  not meet criteria for psychiatric inpatient admission. Supportive therapy provided about ongoing stressors.  Magaret Justo 04/24/2015 2:35 PM

## 2015-04-24 NOTE — ED Notes (Signed)
Meal given. No significant change in behavior. Maintained on 15 minute checks and observation by security camera for safety.

## 2015-04-24 NOTE — ED Notes (Signed)
Patient discharged ambulatory to care of brother. Patient denies SI or HI.  Discharge instructions reviewed with both patient and his brother - they verbalize understanding. Patient received copy of DC plan and all personal belongings.

## 2015-04-30 ENCOUNTER — Ambulatory Visit (INDEPENDENT_AMBULATORY_CARE_PROVIDER_SITE_OTHER): Payer: Medicare Other | Admitting: Psychiatry

## 2015-04-30 ENCOUNTER — Encounter: Payer: Self-pay | Admitting: Psychiatry

## 2015-04-30 VITALS — BP 98/60 | HR 58 | Temp 97.8°F | Ht 66.5 in | Wt 221.0 lb

## 2015-04-30 DIAGNOSIS — F840 Autistic disorder: Secondary | ICD-10-CM

## 2015-04-30 DIAGNOSIS — F250 Schizoaffective disorder, bipolar type: Secondary | ICD-10-CM

## 2015-04-30 MED ORDER — LORAZEPAM 0.5 MG PO TABS: 0.5000 mg | ORAL_TABLET | Freq: Three times a day (TID) | ORAL | Status: DC

## 2015-04-30 MED ORDER — NAPROXEN 500 MG PO TABS: 500.0000 mg | ORAL_TABLET | Freq: Two times a day (BID) | ORAL | Status: DC | PRN

## 2015-04-30 MED ORDER — DIVALPROEX SODIUM ER 500 MG PO TB24: 2000.0000 mg | ORAL_TABLET | Freq: Every day | ORAL | Status: DC

## 2015-04-30 MED ORDER — QUETIAPINE FUMARATE 50 MG PO TABS: 50.0000 mg | ORAL_TABLET | Freq: Three times a day (TID) | ORAL | Status: DC

## 2015-04-30 MED ORDER — HALOPERIDOL 2 MG PO TABS: 2.0000 mg | ORAL_TABLET | Freq: Four times a day (QID) | ORAL | Status: DC | PRN

## 2015-04-30 MED ORDER — LORATADINE 10 MG PO TABS: 10.0000 mg | ORAL_TABLET | Freq: Every day | ORAL | Status: DC

## 2015-04-30 MED ORDER — LURASIDONE HCL 40 MG PO TABS: 40.0000 mg | ORAL_TABLET | Freq: Every day | ORAL | Status: DC

## 2015-04-30 MED ORDER — GABAPENTIN 100 MG PO CAPS: 100.0000 mg | ORAL_CAPSULE | Freq: Three times a day (TID) | ORAL | Status: DC

## 2015-05-04 ENCOUNTER — Other Ambulatory Visit: Payer: Self-pay | Admitting: Psychiatry

## 2015-05-05 ENCOUNTER — Other Ambulatory Visit: Payer: Self-pay

## 2015-05-05 MED ORDER — QUETIAPINE FUMARATE 400 MG PO TABS: 400.0000 mg | ORAL_TABLET | Freq: Every day | ORAL | Status: DC

## 2015-05-05 NOTE — Telephone Encounter (Signed)
received a fax requesting a refill on quetiapine fumarate 400mg  take one tablet by mouth at bedtime.  pt last seen on  04-30-15 next appt 07-28-15.

## 2015-05-14 ENCOUNTER — Ambulatory Visit: Payer: Self-pay | Admitting: Psychiatry

## 2015-05-19 ENCOUNTER — Encounter: Payer: Self-pay | Admitting: Emergency Medicine

## 2015-05-19 ENCOUNTER — Emergency Department
Admission: EM | Admit: 2015-05-19 | Discharge: 2015-05-19 | Disposition: A | Discharge status: Home / Self Care | Payer: Medicare Other | Attending: Emergency Medicine | Admitting: Emergency Medicine

## 2015-05-19 DIAGNOSIS — Z046 Encounter for general psychiatric examination, requested by authority: Secondary | ICD-10-CM | POA: Diagnosis present

## 2015-05-19 DIAGNOSIS — Z79899 Other long term (current) drug therapy: Secondary | ICD-10-CM | POA: Insufficient documentation

## 2015-05-19 DIAGNOSIS — Z794 Long term (current) use of insulin: Secondary | ICD-10-CM | POA: Insufficient documentation

## 2015-05-19 DIAGNOSIS — E119 Type 2 diabetes mellitus without complications: Secondary | ICD-10-CM | POA: Diagnosis not present

## 2015-05-19 DIAGNOSIS — Z880 Allergy status to penicillin: Secondary | ICD-10-CM | POA: Diagnosis not present

## 2015-05-19 DIAGNOSIS — F840 Autistic disorder: Secondary | ICD-10-CM | POA: Diagnosis present

## 2015-05-19 DIAGNOSIS — F1310 Sedative, hypnotic or anxiolytic abuse, uncomplicated: Secondary | ICD-10-CM | POA: Insufficient documentation

## 2015-05-19 DIAGNOSIS — F250 Schizoaffective disorder, bipolar type: Secondary | ICD-10-CM | POA: Diagnosis not present

## 2015-05-19 DIAGNOSIS — I10 Essential (primary) hypertension: Secondary | ICD-10-CM | POA: Diagnosis not present

## 2015-05-19 DIAGNOSIS — F419 Anxiety disorder, unspecified: Secondary | ICD-10-CM | POA: Insufficient documentation

## 2015-05-19 LAB — CBC
HCT: 41 % (ref 40.0–52.0)
HEMOGLOBIN: 13.6 g/dL (ref 13.0–18.0)
MCH: 31.8 pg (ref 26.0–34.0)
MCHC: 33 g/dL (ref 32.0–36.0)
MCV: 96.1 fL (ref 80.0–100.0)
PLATELETS: 132 10*3/uL — AB (ref 150–440)
RBC: 4.27 MIL/uL — ABNORMAL LOW (ref 4.40–5.90)
RDW: 12.8 % (ref 11.5–14.5)
WBC: 6.9 10*3/uL (ref 3.8–10.6)

## 2015-05-19 LAB — COMPREHENSIVE METABOLIC PANEL
ALT: 7 U/L — AB (ref 17–63)
ANION GAP: 8 (ref 5–15)
AST: 21 U/L (ref 15–41)
Albumin: 3.7 g/dL (ref 3.5–5.0)
Alkaline Phosphatase: 49 U/L (ref 38–126)
BUN: 26 mg/dL — ABNORMAL HIGH (ref 6–20)
CHLORIDE: 99 mmol/L — AB (ref 101–111)
CO2: 28 mmol/L (ref 22–32)
Calcium: 8.8 mg/dL — ABNORMAL LOW (ref 8.9–10.3)
Creatinine, Ser: 1.05 mg/dL (ref 0.61–1.24)
Glucose, Bld: 198 mg/dL — ABNORMAL HIGH (ref 65–99)
POTASSIUM: 4.1 mmol/L (ref 3.5–5.1)
SODIUM: 135 mmol/L (ref 135–145)
Total Bilirubin: 0.3 mg/dL (ref 0.3–1.2)
Total Protein: 6.9 g/dL (ref 6.5–8.1)

## 2015-05-19 LAB — URINE DRUG SCREEN, QUALITATIVE (ARMC ONLY)
AMPHETAMINES, UR SCREEN: NOT DETECTED
Barbiturates, Ur Screen: NOT DETECTED
Benzodiazepine, Ur Scrn: POSITIVE — AB
CANNABINOID 50 NG, UR ~~LOC~~: NOT DETECTED
COCAINE METABOLITE, UR ~~LOC~~: NOT DETECTED
MDMA (ECSTASY) UR SCREEN: NOT DETECTED
METHADONE SCREEN, URINE: NOT DETECTED
Opiate, Ur Screen: NOT DETECTED
Phencyclidine (PCP) Ur S: NOT DETECTED
TRICYCLIC, UR SCREEN: POSITIVE — AB

## 2015-05-19 LAB — SALICYLATE LEVEL

## 2015-05-19 LAB — ACETAMINOPHEN LEVEL

## 2015-05-19 LAB — ETHANOL

## 2015-05-19 MED ORDER — LORAZEPAM 1 MG PO TABS
1.0000 mg | ORAL_TABLET | Freq: Once | ORAL | Status: AC
  Administered 2015-05-19: 1 mg via ORAL
  Filled 2015-05-19: qty 1

## 2015-05-19 MED ORDER — QUETIAPINE FUMARATE 100 MG PO TABS: 100.0000 mg | ORAL_TABLET | Freq: Four times a day (QID) | ORAL | Status: DC | PRN

## 2015-05-19 MED ORDER — LORAZEPAM 2 MG/ML IJ SOLN
2.0000 mg | Freq: Once | INTRAMUSCULAR | Status: AC
  Administered 2015-05-19: 2 mg via INTRAMUSCULAR
  Filled 2015-05-19: qty 1

## 2015-05-19 NOTE — ED Notes (Signed)

## 2015-05-19 NOTE — ED Provider Notes (Signed)
-----------------------------------------   5:14 PM on 05/19/2015 -----------------------------------------  Patient has been seen and evaluated by psychiatry. They believe the patient states for discharge home. Patient has follow-up with Dr.[name].    Harvest Dark, MD 05/19/15 [phone]

## 2015-05-19 NOTE — ED Notes (Addendum)
Pt to ED voluntary with [employer], pt tearful in traige, states "I am having all kinds of spells and I need help", pt also verbalizing SI

## 2015-05-19 NOTE — ED Notes (Signed)
Patient resting with eyes closed , no s/s of distress.

## 2015-05-19 NOTE — ED Notes (Signed)
TTS in [place] notified for TTS consult.

## 2015-05-19 NOTE — ED Provider Notes (Addendum)
[facility] Emergency Department Provider Note  ____________________________________________   I have reviewed the triage vital signs and the nursing notes.   HISTORY  Chief Complaint Mental Health Evaluation     HPI Alexander Beard is a 59 y.o. male with a history of anxiety and multiple other medical problems this including schizoaffective disorder presents today complaining of spells. He states he has anxiety history and he gets anxiety attacks. He is not very forthcoming about what brought him in here exactly today except for the frequency of the spells are increasing and he is in "mental agony". He states it continues like this he can't say he won't kill himself. He denies this plan to kill himself. He states sometimes he thinks about hurting other people as well but he will not act upon it. Patient has no headache stiff neck nausea vomiting organic process today and he is well-known to this facility.  Past Medical History  Diagnosis Date  . Schizophrenia, schizoaffective ([place])   . Autistic disorder, residual state   . Anxiety disorder due to known physiological condition   . Anxiety   . Esophageal reflux   . Diabetes mellitus without complication ([place])   . Hypertension   . Developmental disorder   . Arthritis   . Dyslipidemia   . Sleep apnea   . Overactive bladder   . Urinary incontinence     Patient Active Problem List   Diagnosis Date Noted  . Diabetes ([place]) 04/10/2015  . Hypertension 04/10/2015  . Prostate hypertrophy 04/10/2015  . Schizoaffective disorder ([place]) 04/10/2015  . Enlarged prostate 04/10/2015  . Essential (primary) hypertension 04/10/2015  . Type 2 diabetes mellitus ([place]) 04/10/2015  . Schizoaffective disorder, bipolar type ([place]) 12/29/2014  . Active autistic disorder 12/29/2014    Past Surgical History  Procedure Laterality Date  . Tonsillectomy      Current Outpatient Rx  Name  Route  Sig  Dispense  Refill  .  Cholecalciferol (VITAMIN D-1000 MAX ST) 1000 UNITS tablet   Oral   Take 5,000 Units by mouth.          . divalproex (DEPAKOTE ER) 500 MG 24 hr tablet   Oral   Take 4 tablets (2,000 mg total) by mouth at bedtime.   120 tablet   5   . furosemide (LASIX) 20 MG tablet   Oral   Take by mouth.         . gabapentin (NEURONTIN) 100 MG capsule   Oral   Take 1 capsule (100 mg total) by mouth 3 (three) times daily.   90 capsule   5   . haloperidol (HALDOL) 2 MG tablet   Oral   Take 1 tablet (2 mg total) by mouth every 6 (six) hours as needed for agitation (not to exceed 4 doses in 24 hours).   90 tablet   5   . insulin glargine (LANTUS) 100 UNIT/ML injection   Subcutaneous   Inject 0.12 mLs (12 Units total) into the skin at bedtime.   30 mL   0   . lamoTRIgine (LAMICTAL) 200 MG tablet   Oral   Take 1 tablet (200 mg total) by mouth at bedtime.   30 tablet   3   . lisinopril (PRINIVIL,ZESTRIL) 5 MG tablet   Oral   Take by mouth.         . loratadine (CLARITIN) 10 MG tablet   Oral   Take 1 tablet (10 mg total) by mouth daily.  30 tablet   5   . LORazepam (ATIVAN) 0.5 MG tablet   Oral   Take 1 tablet (0.5 mg total) by mouth every 8 (eight) hours.   90 tablet   5   . LORazepam (ATIVAN) 1 MG tablet   Oral   Take 1 tablet (1 mg total) by mouth 3 (three) times daily with meals as needed (agitation).   30 tablet   0   . lurasidone (LATUDA) 40 MG TABS tablet   Oral   Take 1 tablet (40 mg total) by mouth daily with breakfast.   30 tablet   5   . metoprolol succinate (TOPROL-XL) 50 MG 24 hr tablet   Oral   Take by mouth.         . naproxen (NAPROSYN) 500 MG tablet   Oral   Take 1 tablet (500 mg total) by mouth 2 (two) times daily as needed for moderate pain (not to exceed 2 doses in 24 hours).   60 tablet   5   . QUEtiapine (SEROQUEL) 400 MG tablet   Oral   Take 1 tablet (400 mg total) by mouth at bedtime.   30 tablet   3   . QUEtiapine (SEROQUEL)  50 MG tablet   Oral   Take 1 tablet (50 mg total) by mouth 3 (three) times daily.   90 tablet   5     Give in am and at 3 pm   . sertraline (ZOLOFT) 100 MG tablet      Take two tablets (200mg ) by mouth once daily   60 tablet   3   . simvastatin (ZOCOR) 20 MG tablet   Oral   Take 20 mg by mouth daily.         . sitaGLIPtin (JANUVIA) 100 MG tablet   Oral   Take 100 mg by mouth daily.         . solifenacin (VESICARE) 5 MG tablet   Oral   Take 5 mg by mouth daily.         . tamsulosin (FLOMAX) 0.4 MG CAPS capsule   Oral   Take 0.4 mg by mouth.           Allergies Cortizone-10 ; Other; and Penicillins  Family History  Problem Relation Age of Onset  . Family history unknown: Yes    Social History Social History  Substance Use Topics  . Smoking status: Never Smoker   . Smokeless tobacco: Never Used  . Alcohol Use: No    Review of Systems Constitutional: No fever/chills Eyes: No visual changes. ENT: No sore throat. No stiff neck no neck pain Cardiovascular: Denies chest pain. Respiratory: Denies shortness of breath. Gastrointestinal:   no vomiting.  No diarrhea.  No constipation. Genitourinary: Negative for dysuria. Musculoskeletal: Negative lower extremity swelling Skin: Negative for rash. Neurological: Negative for headaches, focal weakness or numbness. 10-point ROS otherwise negative.  ____________________________________________   PHYSICAL EXAM:  VITAL SIGNS: ED Triage Vitals  Enc Vitals Group     BP 05/19/15 0810 103/60 mmHg     Pulse Rate 05/19/15 0811 77     Resp 05/19/15 0810 18     Temp 05/19/15 0810 98.5 F (36.9 C)     Temp Source 05/19/15 0810 Oral     SpO2 05/19/15 0811 95 %     Weight 05/19/15 0810 220 lb (99.791 kg)     Height 05/19/15 0810 5\' 8"  (1.727 m)     Head Cir --  Peak Flow --      Pain Score 05/19/15 0811 0     Pain Loc --      Pain Edu? --      Excl. in [place]? --     Constitutional: Alert and oriented.  Well appearing and in no acute distress. Tearful but not medically ill Eyes: Conjunctivae are normal. PERRL. EOMI. Head: Atraumatic. Nose: No congestion/rhinnorhea. Mouth/Throat: Mucous membranes are moist.  Oropharynx non-erythematous. Neck: No stridor.   Nontender with no meningismus Cardiovascular: Normal rate, regular rhythm. Grossly normal heart sounds.  Good peripheral circulation. Respiratory: Normal respiratory effort.  No retractions. Lungs CTAB. Abdominal: Soft and nontender. No distention. No guarding no rebound Back:  There is no focal tenderness or step off there is no midline tenderness there are no lesions noted. there is no CVA tenderness Musculoskeletal: No lower extremity tenderness. No joint effusions, no DVT signs strong distal pulses no edema Neurologic:  Normal speech and language. No gross focal neurologic deficits are appreciated.  Skin:  Skin is warm, dry and intact. No rash noted. Psychiatric: Mood and affect are anxious and upset. Speech and behavior are normal.  ____________________________________________   LABS (all labs ordered are listed, but only abnormal results are displayed)  Labs Reviewed  COMPREHENSIVE METABOLIC PANEL  ETHANOL  SALICYLATE LEVEL  ACETAMINOPHEN LEVEL  CBC  URINE DRUG SCREEN, QUALITATIVE ([place])   ____________________________________________  EKG  I personally interpreted any EKGs ordered by me or triage  ____________________________________________  RADIOLOGY  I reviewed any imaging ordered by me or triage that were performed during my shift ____________________________________________   PROCEDURES  Procedure(s) performed: None  Critical Care performed: None  ____________________________________________   INITIAL IMPRESSION / ASSESSMENT AND PLAN / ED COURSE  Pertinent labs & imaging results that were available during my care of the patient were reviewed by me and considered in my medical decision making (see  chart for details).  Patient with anxiety presents with anxiety and tearfulness and some passive thoughts of possible harm, he is well known to [place] here. We'll give him Ativan because he states when these spells happen knowing can control him. Then he appears to be emotionally upset at this time. We'll watch him closely in the emergency department he will require again psychiatric disposition. ____________________________________________  ----------------------------------------- 3:25 PM on 05/19/2015 -----------------------------------------  Patient's personal psychiatrist is here and evaluating the patient. We will defer starting his medication until that determination is made. Patient they will be able to go home today as it does not appear that it is likely the patient's psychiatric care will benefit him however, we will await the official determination of psychiatry.   FINAL CLINICAL IMPRESSION(S) / ED DIAGNOSES  Final diagnoses:  None     Schuyler Amor, MD 05/19/15 [id number]  Schuyler Amor, MD 05/19/15 1525

## 2015-05-19 NOTE — ED Notes (Signed)

## 2015-05-19 NOTE — ED Notes (Signed)
Nurse administered 1 mg of ativan for anxiety. Patient took without difficulty, will continue to monitor.

## 2015-05-19 NOTE — Consult Note (Signed)
[facility] Face-to-Face Psychiatry Consult   Reason for Consult:  Consult for this 59 year old gentleman with a history of schizoaffective disorder and autism who was brought in here from his group home after throwing something of a temper tantrum this morning and running away into the street Referring Physician:  McShane Patient Identification: Alexander Beard MRN:  [id number] Principal Diagnosis: Schizoaffective disorder, bipolar type [facility]) Diagnosis:   Patient Active Problem List   Diagnosis Date Noted  . Diabetes ([place]) [E11.9] 04/10/2015  . Hypertension [I10] 04/10/2015  . Prostate hypertrophy [N40.0] 04/10/2015  . Schizoaffective disorder ([place]) [F25.9] 04/10/2015  . Enlarged prostate [N40.0] 04/10/2015  . Essential (primary) hypertension [I10] 04/10/2015  . Type 2 diabetes mellitus ([place]) [E11.9] 04/10/2015  . Schizoaffective disorder, bipolar type ([place]) [F25.0] 12/29/2014  . Active autistic disorder [F84.0] 12/29/2014    Total Time spent with patient: 1 hour  Subjective:   Alexander Beard is a 59 y.o. male patient admitted with "you know what bothers me".  HPI:  Alexander Beard is a 59 year old man with a diagnosis of schizoaffective disorder whom I have been seeing for several years now. I have long felt that his presentation is more typical of autism. In any case what brings him in today is that this morning he impulsively went into one of his moods and ran out the group home and out into the street. He didn't get injured but he was making quite a scene and frightening the group home primarily because they were worried about his own safety. Alexander Beard admits to doing this. He says what triggered him was that the girls at the group home (other adult women) were giving him a hard time about having slammed a door. He says that that was on top of his chronic anxiety anger and frustration about not being able to do everything he wants all of the time. He stays frustrated much of the time. He sleeps  erratically a little bit worse recently. He does not report any auditory or visual hallucinations. Doesn't make any psychotic or paranoid statements. As usual Alexander Beard has a chronic degree of frustration about the fact that he is not given all the privileges that he would like. He is actually getting quite a bit more privileges and special attention from the group home than he had before but he always wants more than what they are able to offer him. Alexander Beard completely denies any suicidal or homicidal ideation. I spoke with the manager of the group home who asks me whether we might adjust some of his when necessary medicine.  Social history: Patient resides in a adult group home. His brother, Alexander Beard, is his power of attorney and takes good care of Alexander Beard overall. Alexander Beard has no children and Alexander Beard is as far as I know his only living relative.  Medical history: Diabetes, high blood pressure, history of enlarged prostate history of some peripheral neuropathy I believe.  Substance abuse history: Does not drink or use drugs no history of alcohol or drug abuse.  Past Psychiatric History: Patient has had a long-standing mental health problem going back into childhood. Multiple hospitalizations over the years. He attained a diagnosis of schizoaffective disorder although when I reviewed all of the records I could find I could never really find a clear description of him being psychotic. Instead he seems to have a chronic problem with emotional immaturity and impulsive behavior. No history of suicide attempts no history of really intentional violence. He is on a large  number of psychiatric medicines which have been arrived at empirically and have seem to be of some help.  Risk to Self:   Risk to Others:   Prior Inpatient Therapy:   Prior Outpatient Therapy:    Past Medical History:  Past Medical History  Diagnosis Date  . Schizophrenia, schizoaffective ([place])   . Autistic disorder, residual state   .  Anxiety disorder due to known physiological condition   . Anxiety   . Esophageal reflux   . Diabetes mellitus without complication ([place])   . Hypertension   . Developmental disorder   . Arthritis   . Dyslipidemia   . Sleep apnea   . Overactive bladder   . Urinary incontinence     Past Surgical History  Procedure Laterality Date  . Tonsillectomy     Family History:  Family History  Problem Relation Age of Onset  . Family history unknown: Yes   Family Psychiatric  History: There is no known family history of mental illness Social History:  History  Alcohol Use No     History  Drug Use No    Social History   Social History  . Marital Status: Single    Spouse Name: N/A  . Number of Children: N/A  . Years of Education: N/A   Social History Main Topics  . Smoking status: Never Smoker   . Smokeless tobacco: Never Used  . Alcohol Use: No  . Drug Use: No  . Sexual Activity: No   Other Topics Concern  . None   Social History Narrative   The patient never finished high school but did get his GED. 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 He is currently in disability and his brother Alexander Beard is his legal guardian. He has been living in group homes for many years.      No pending legal charges   Additional Social History:                          Allergies:   Allergies  Allergen Reactions  . Cortizone-10  [Hydrocortisone] Other (See Comments)  . Other Other (See Comments)    MYACINS  . Penicillins Nausea And Vomiting    Labs:  Results for orders placed or performed during the hospital encounter of 05/19/15 (from the past 48 hour(s))  Comprehensive metabolic panel     Status: Abnormal   Collection Time: 05/19/15  8:13 AM  Result Value Ref Range   Sodium 135 135 - 145 mmol/L   Potassium 4.1 3.5 - 5.1 mmol/L   Chloride 99 (L) 101 - 111 mmol/L   CO2 28 22 - 32 mmol/L   Glucose, Bld 198 (H) 65 -  99 mg/dL   BUN 26 (H) 6 - 20 mg/dL   Creatinine, Ser 1.05 0.61 - 1.24 mg/dL   Calcium 8.8 (L) 8.9 - 10.3 mg/dL   Total Protein 6.9 6.5 - 8.1 g/dL   Albumin 3.7 3.5 - 5.0 g/dL   AST 21 15 - 41 U/L   ALT 7 (L) 17 - 63 U/L   Alkaline Phosphatase 49 38 - 126 U/L   Total Bilirubin 0.3 0.3 - 1.2 mg/dL   GFR calc non Af Amer >60 >60 mL/min   GFR calc Af Amer >60 >60 mL/min    Comment: (NOTE) The eGFR has been calculated using the CKD EPI equation. This calculation has not been  validated in all clinical situations. eGFR's persistently <60 mL/min signify possible Chronic Kidney Disease.    Anion gap 8 5 - 15  Ethanol (ETOH)     Status: None   Collection Time: 05/19/15  8:13 AM  Result Value Ref Range   Alcohol, Ethyl (B) <5 <5 mg/dL    Comment:        LOWEST DETECTABLE LIMIT FOR SERUM ALCOHOL IS 5 mg/dL FOR MEDICAL PURPOSES ONLY   Salicylate level     Status: None   Collection Time: 05/19/15  8:13 AM  Result Value Ref Range   Salicylate Lvl <3.0 2.8 - 30.0 mg/dL  Acetaminophen level     Status: Abnormal   Collection Time: 05/19/15  8:13 AM  Result Value Ref Range   Acetaminophen (Tylenol), Serum <10 (L) 10 - 30 ug/mL    Comment:        THERAPEUTIC CONCENTRATIONS VARY SIGNIFICANTLY. A RANGE OF 10-30 ug/mL MAY BE AN EFFECTIVE CONCENTRATION FOR MANY PATIENTS. HOWEVER, SOME ARE BEST TREATED AT CONCENTRATIONS OUTSIDE THIS RANGE. ACETAMINOPHEN CONCENTRATIONS >150 ug/mL AT 4 HOURS AFTER INGESTION AND >50 ug/mL AT 12 HOURS AFTER INGESTION ARE OFTEN ASSOCIATED WITH TOXIC REACTIONS.   CBC     Status: Abnormal   Collection Time: 05/19/15  8:13 AM  Result Value Ref Range   WBC 6.9 3.8 - 10.6 K/uL   RBC 4.27 (L) 4.40 - 5.90 MIL/uL   Hemoglobin 13.6 13.0 - 18.0 g/dL   HCT 41.0 40.0 - 52.0 %   MCV 96.1 80.0 - 100.0 fL   MCH 31.8 26.0 - 34.0 pg   MCHC 33.0 32.0 - 36.0 g/dL   RDW 12.8 11.5 - 14.5 %   Platelets 132 (L) 150 - 440 K/uL  Urine Drug Screen, Qualitative ([facility] only)      Status: Abnormal   Collection Time: 05/19/15  8:13 AM  Result Value Ref Range   Tricyclic, Ur Screen POSITIVE (A) NONE DETECTED   Amphetamines, Ur Screen NONE DETECTED NONE DETECTED   MDMA (Ecstasy)Ur Screen NONE DETECTED NONE DETECTED   Cocaine Metabolite,Ur [place] NONE DETECTED NONE DETECTED   Opiate, Ur Screen NONE DETECTED NONE DETECTED   Phencyclidine (PCP) Ur S NONE DETECTED NONE DETECTED   Cannabinoid 50 Ng, Ur [place] NONE DETECTED NONE DETECTED   Barbiturates, Ur Screen NONE DETECTED NONE DETECTED   Benzodiazepine, Ur Scrn POSITIVE (A) NONE DETECTED   Methadone Scn, Ur NONE DETECTED NONE DETECTED    Comment: (NOTE) 092  Tricyclics, urine               Cutoff 1000 ng/mL 200  Amphetamines, urine             Cutoff 1000 ng/mL 300  MDMA (Ecstasy), urine           Cutoff 500 ng/mL 400  Cocaine Metabolite, urine       Cutoff 300 ng/mL 500  Opiate, urine                   Cutoff 300 ng/mL 600  Phencyclidine (PCP), urine      Cutoff 25 ng/mL 700  Cannabinoid, urine              Cutoff 50 ng/mL 800  Barbiturates, urine             Cutoff 200 ng/mL 900  Benzodiazepine, urine           Cutoff 200 ng/mL 1000 Methadone, urine  Cutoff 300 ng/mL 1100 1200 The urine drug screen provides only a preliminary, unconfirmed 1300 analytical test result and should not be used for non-medical 1400 purposes. Clinical consideration and professional judgment should 1500 be applied to any positive drug screen result due to possible 1600 interfering substances. A more specific alternate chemical method 1700 must be used in order to obtain a confirmed analytical result.  1800 Gas chromato graphy / mass spectrometry (GC/MS) is the preferred 1900 confirmatory method.     No current facility-administered medications for this encounter.   Current Outpatient Prescriptions  Medication Sig Dispense Refill  . Cholecalciferol (VITAMIN D-1000 MAX ST) 1000 UNITS tablet Take 5,000 Units by mouth.     .  divalproex (DEPAKOTE ER) 500 MG 24 hr tablet Take 4 tablets (2,000 mg total) by mouth at bedtime. 120 tablet 5  . furosemide (LASIX) 20 MG tablet Take by mouth.    . gabapentin (NEURONTIN) 100 MG capsule Take 1 capsule (100 mg total) by mouth 3 (three) times daily. 90 capsule 5  . haloperidol (HALDOL) 2 MG tablet Take 1 tablet (2 mg total) by mouth every 6 (six) hours as needed for agitation (not to exceed 4 doses in 24 hours). 90 tablet 5  . insulin glargine (LANTUS) 100 UNIT/ML injection Inject 0.12 mLs (12 Units total) into the skin at bedtime. 30 mL 0  . lamoTRIgine (LAMICTAL) 100 MG tablet Take 100 mg by mouth at bedtime.    Marland Kitchen lisinopril (PRINIVIL,ZESTRIL) 5 MG tablet Take by mouth.    . loratadine (CLARITIN) 10 MG tablet Take 1 tablet (10 mg total) by mouth daily. 30 tablet 5  . LORazepam (ATIVAN) 0.5 MG tablet Take 1 tablet (0.5 mg total) by mouth every 8 (eight) hours. 90 tablet 5  . lurasidone (LATUDA) 40 MG TABS tablet Take 1 tablet (40 mg total) by mouth daily with breakfast. 30 tablet 5  . metoprolol succinate (TOPROL-XL) 50 MG 24 hr tablet Take by mouth.    . naproxen (NAPROSYN) 500 MG tablet Take 1 tablet (500 mg total) by mouth 2 (two) times daily as needed for moderate pain (not to exceed 2 doses in 24 hours). 60 tablet 5  . QUEtiapine (SEROQUEL) 400 MG tablet Take 1 tablet (400 mg total) by mouth at bedtime. 30 tablet 3  . QUEtiapine (SEROQUEL) 50 MG tablet Take 1 tablet (50 mg total) by mouth 3 (three) times daily. 90 tablet 5  . sertraline (ZOLOFT) 100 MG tablet Take two tablets (217m) by mouth once daily 60 tablet 3  . simvastatin (ZOCOR) 20 MG tablet Take 20 mg by mouth daily.    . sitaGLIPtin (JANUVIA) 100 MG tablet Take 100 mg by mouth daily.    . solifenacin (VESICARE) 5 MG tablet Take 5 mg by mouth daily.    . QUEtiapine (SEROQUEL) 100 MG tablet Take 1 tablet (100 mg total) by mouth every 6 (six) hours as needed (agitation). 60 tablet 3    Musculoskeletal: Strength &  Muscle Tone: within normal limits Gait & Station: normal Patient leans: N/A  Psychiatric Specialty Exam: Review of Systems  Constitutional: Negative.   HENT: Negative.   Eyes: Negative.   Respiratory: Negative.   Cardiovascular: Negative.   Gastrointestinal: Negative.   Musculoskeletal: Negative.   Skin: Negative.   Neurological: Negative.   Psychiatric/Behavioral: Positive for depression. Negative for suicidal ideas, hallucinations, memory loss and substance abuse. The patient is nervous/anxious and has insomnia.     Blood pressure 111/71, pulse 68, temperature 98.2  F (36.8 C), temperature source Oral, resp. rate 18, height 5' 8"  (1.727 m), weight 99.791 kg (220 lb), SpO2 99 %.Body mass index is 33.46 kg/(m^2).  General Appearance: Casual  Eye Contact::  Fair  Speech:  Clear and Coherent  Volume:  Increased  Mood:  Anxious  Affect:  Congruent  Thought Process:  Tangential  Orientation:  Full (Time, Place, and Person)  Thought Content:  Negative  Suicidal Thoughts:  No  Homicidal Thoughts:  No  Memory:  Immediate;   Good Recent;   Fair Remote;   Fair  Judgement:  Impaired  Insight:  Lacking  Psychomotor Activity:  Psychomotor Retardation  Concentration:  Fair  Recall:  [place]  Language: Fair  Akathisia:  No  Handed:  Right  AIMS (if indicated):     Assets:  Communication Skills Desire for Improvement Financial Resources/Insurance Housing Intimacy Social Support Talents/Skills  ADL's:  Intact  Cognition: Impaired,  Mild  Sleep:      Treatment Plan Summary: Medication management and Plan I spoke with the owner of the group home. I am very familiar with her and with the patient since I see him as an outpatient. I don't think he needs to be in a psychiatric hospital or meets commitment criteria. Instead we discussed changing his when necessary medicine. I will discontinue his Haldol when necessary and replace it with a prescription for  Seroquel 100 mg every 6 hours as needed for agitation. He Artie is on a total of 550 mg of Seroquel a day but doesn't have any problem tolerating it so the extra dose may help more than the Haldol has. Supportive counseling to both the patient and the group home. I will continue to follow-up with him as an outpatient. Case discussed with emergency room physician.  Disposition: Patient does not meet criteria for psychiatric inpatient admission. Supportive therapy provided about ongoing stressors.  Abdon Petrosky 05/19/2015 4:23 PM

## 2015-05-19 NOTE — ED Notes (Signed)
Patient discharged, Brother and patient voiced understanding of discharge instructions, all patient's belongings returned to patient, patient with new prescription, patient transported back to group home per brother. Patient did resist at first leaving, but then became calm and got in car after brother told him He was trying to get him back in school.

## 2015-05-19 NOTE — ED Notes (Signed)

## 2015-05-19 NOTE — ED Notes (Signed)
Patient's brother in to visit and patient became agitated, loud yelling, stating" I'm not going back to that place, there is no hope there, " encouraged Brother to leave and nurse coaxed nurse to calm down, notified Dr. Burlene Arnt and He will order medication for anxiety.

## 2015-05-19 NOTE — ED Notes (Signed)
Patient watching tv, no Si/HI/ or avh noted.

## 2015-05-19 NOTE — ED Notes (Signed)
Pts brother [name] who is POa came to visit, pt became very upset and yelling . The brother states this is what happened this morning during breakfast pt began to yell about wanting to move to another group home because this one has to many rules. He also wants to return to school ,but he can not keep his behavior under control .

## 2015-05-19 NOTE — Discharge Instructions (Signed)
Generalized Anxiety Disorder °Generalized anxiety disorder (GAD) is a mental disorder. It interferes with life functions, including relationships, work, and school. °GAD is different from normal anxiety, which everyone experiences at some point in their lives in response to specific life events and activities. Normal anxiety actually helps us prepare for and get through these life events and activities. Normal anxiety goes away after the event or activity is over.  °GAD causes anxiety that is not necessarily related to specific events or activities. It also causes excess anxiety in proportion to specific events or activities. The anxiety associated with GAD is also difficult to control. GAD can vary from mild to severe. People with severe GAD can have intense waves of anxiety with physical symptoms (panic attacks).  °SYMPTOMS °The anxiety and worry associated with GAD are difficult to control. This anxiety and worry are related to many life events and activities and also occur more days than not for 6 months or longer. People with GAD also have three or more of the following symptoms (one or more in children): °· Restlessness.   °· Fatigue. °· Difficulty concentrating.   °· Irritability. °· Muscle tension. °· Difficulty sleeping or unsatisfying sleep. °DIAGNOSIS °GAD is diagnosed through an assessment by your health care provider. Your health care provider will ask you questions about your mood, physical symptoms, and events in your life. Your health care provider may ask you about your medical history and use of alcohol or drugs, including prescription medicines. Your health care provider may also do a physical exam and blood tests. Certain medical conditions and the use of certain substances can cause symptoms similar to those associated with GAD. Your health care provider may refer you to a mental health specialist for further evaluation. °TREATMENT °The following therapies are usually used to treat GAD:   °· Medication. Antidepressant medication usually is prescribed for long-term daily control. Antianxiety medicines may be added in severe cases, especially when panic attacks occur.   °· Talk therapy (psychotherapy). Certain types of talk therapy can be helpful in treating GAD by providing support, education, and guidance. A form of talk therapy called cognitive behavioral therapy can teach you healthy ways to think about and react to daily life events and activities. °· Stress management techniques. These include yoga, meditation, and exercise and can be very helpful when they are practiced regularly. °A mental health specialist can help determine which treatment is best for you. Some people see improvement with one therapy. However, other people require a combination of therapies. °  °This information is not intended to replace advice given to you by your health care provider. Make sure you discuss any questions you have with your health care provider. °  °Document Released: 10/22/2012 Document Revised: 07/18/2014 Document Reviewed: 10/22/2012 °Elsevier Interactive Patient Education ©2016 [employer] Inc[employer] ° °Panic Attacks °Panic attacks are sudden, short-lived surges of severe anxiety, fear, or discomfort. They may occur for no reason when you are relaxed, when you are anxious, or when you are sleeping. Panic attacks may occur for a number of reasons:  °· Healthy people occasionally have panic attacks in extreme, life-threatening situations, such as war or natural disasters. Normal anxiety is a protective mechanism of the body that helps us react to danger (fight or flight response). °· Panic attacks are often seen with anxiety disorders, such as panic disorder, social anxiety disorder, generalized anxiety disorder, and phobias. Anxiety disorders cause excessive or uncontrollable anxiety. They may interfere with your relationships or other life activities. °· Panic attacks are sometimes seen with other   mental  illnesses, such as depression and posttraumatic stress disorder. °· Certain medical conditions, prescription medicines, and drugs of abuse can cause panic attacks. °SYMPTOMS  °Panic attacks start suddenly, peak within 20 minutes, and are accompanied by four or more of the following symptoms: °· Pounding heart or fast heart rate (palpitations). °· Sweating. °· Trembling or shaking. °· Shortness of breath or feeling smothered. °· Feeling choked. °· Chest pain or discomfort. °· Nausea or strange feeling in your stomach. °· Dizziness, light-headedness, or feeling like you will faint. °· Chills or hot flushes. °· Numbness or tingling in your lips or hands and feet. °· Feeling that things are not real or feeling that you are not yourself. °· Fear of losing control or going crazy. °· Fear of dying. °Some of these symptoms can mimic serious medical conditions. For example, you may think you are having a heart attack. Although panic attacks can be very scary, they are not life threatening. °DIAGNOSIS  °Panic attacks are diagnosed through an assessment by your health care provider. Your health care provider will ask questions about your symptoms, such as where and when they occurred. Your health care provider will also ask about your medical history and use of alcohol and drugs, including prescription medicines. Your health care provider may order blood tests or other studies to rule out a serious medical condition. Your health care provider may refer you to a mental health professional for further evaluation. °TREATMENT  °· Most healthy people who have one or two panic attacks in an extreme, life-threatening situation will not require treatment. °· The treatment for panic attacks associated with anxiety disorders or other mental illness typically involves counseling with a mental health professional, medicine, or a combination of both. Your health care provider will help determine what treatment is best for you. °· Panic  attacks due to physical illness usually go away with treatment of the illness. If prescription medicine is causing panic attacks, talk with your health care provider about stopping the medicine, decreasing the dose, or substituting another medicine. °· Panic attacks due to alcohol or drug abuse go away with abstinence. Some adults need professional help in order to stop drinking or using drugs. °HOME CARE INSTRUCTIONS  °· Take all medicines as directed by your health care provider.   °· Schedule and attend follow-up visits as directed by your health care provider. It is important to keep all your appointments. °SEEK MEDICAL CARE IF: °· You are not able to take your medicines as prescribed. °· Your symptoms do not improve or get worse. °SEEK IMMEDIATE MEDICAL CARE IF:  °· You experience panic attack symptoms that are different than your usual symptoms. °· You have serious thoughts about hurting yourself or others. °· You are taking medicine for panic attacks and have a serious side effect. °MAKE SURE YOU: °· Understand these instructions. °· Will watch your condition. °· Will get help right away if you are not doing well or get worse. °  °This information is not intended to replace advice given to you by your health care provider. Make sure you discuss any questions you have with your health care provider. °  °Document Released: 06/27/2005 Document Revised: 07/02/2013 Document Reviewed: 02/08/2013 °Elsevier Interactive Patient Education ©2016 Elsevier Inc. ° °

## 2015-05-19 NOTE — ED Notes (Signed)
Patient is becoming agitated about discharge, complaining about going back to same group home, states " I need more freedom, nurse listened and told him that his Brother was on the way to transport him, Patient did lay back down and became quiet, will continue to monitor.

## 2015-05-19 NOTE — ED Notes (Signed)
Patient is watching tv, no evidence of stress at this time, no si/hi/avh noted.

## 2015-05-19 NOTE — ED Notes (Signed)
Patient is talking with DR. [name] quietly, intervention effective.

## 2015-06-25 NOTE — Progress Notes (Signed)
[facility] MD Progress Note  06/25/2015 5:02 PM Alexander Beard  MRN:  [id number] Subjective:  Follow-up for this 59 year old man with a history of schizoaffective disorder and autistic spectrum disorder. Keyan has had a worsening of some of his symptoms recently as evidenced by a hospitalization in more than one visit to the emergency room. Today he is calm and apologetic. He is not reporting any psychotic symptoms. He doesn't seem obviously different than his usual self but it sounds like he is getting more easily worked up and agitated. Medically he appears to be stable. Did not make any psychotic statements Principal Problem: @PPROB @ Diagnosis:   Patient Active Problem List   Diagnosis Date Noted  . Diabetes ([place]) [E11.9] 04/10/2015  . Hypertension [I10] 04/10/2015  . Prostate hypertrophy [N40.0] 04/10/2015  . Schizoaffective disorder ([place]) [F25.9] 04/10/2015  . Enlarged prostate [N40.0] 04/10/2015  . Essential (primary) hypertension [I10] 04/10/2015  . Type 2 diabetes mellitus ([place]) [E11.9] 04/10/2015  . Schizoaffective disorder, bipolar type ([place]) [F25.0] 12/29/2014  . Active autistic disorder [F84.0] 12/29/2014   Total Time spent with patient: 25 minutes  Past Psychiatric History: Long-standing history of mental health problems essentially lifelong with chronic disability. Diagnosis of schizoaffective disorder which I have seen as being probably overall more typical of autistic spectrum  Past Medical History:  Past Medical History  Diagnosis Date  . Schizophrenia, schizoaffective ([place])   . Autistic disorder, residual state   . Anxiety disorder due to known physiological condition   . Anxiety   . Esophageal reflux   . Diabetes mellitus without complication ([place])   . Hypertension   . Developmental disorder   . Arthritis   . Dyslipidemia   . Sleep apnea   . Overactive bladder   . Urinary incontinence     Past Surgical History  Procedure Laterality Date  . Tonsillectomy      Family History:  Family History  Problem Relation Age of Onset  . Family history unknown: Yes   Family Psychiatric  History: There is no identified family history of mental health problems Social History:  History  Alcohol Use No     History  Drug Use No    Social History   Social History  . Marital Status: Single    Spouse Name: N/A  . Number of Children: N/A  . Years of Education: N/A   Social History Main Topics  . Smoking status: Never Smoker   . Smokeless tobacco: Never Used  . Alcohol Use: No  . Drug Use: No  . Sexual Activity: No   Other Topics Concern  . None   Social History Narrative   The patient never finished high school but did get his GED. 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 He is currently in disability and [name] is his legal guardian. He has been living in group homes for many years.      No pending legal charges   Additional Social History:                         Sleep: Good  Appetite:  Good  Current Medications: Current Outpatient Prescriptions  Medication Sig Dispense Refill  . Cholecalciferol (VITAMIN D-1000 MAX ST) 1000 UNITS tablet Take 5,000 Units by mouth.     . divalproex (DEPAKOTE ER) 500 MG 24 hr tablet Take 4 tablets (2,000 mg total) by mouth at bedtime.  120 tablet 5  . furosemide (LASIX) 20 MG tablet Take by mouth.    . insulin glargine (LANTUS) 100 UNIT/ML injection Inject 0.12 mLs (12 Units total) into the skin at bedtime. 30 mL 0  . lisinopril (PRINIVIL,ZESTRIL) 5 MG tablet Take by mouth.    . metoprolol succinate (TOPROL-XL) 50 MG 24 hr tablet Take by mouth.    . QUEtiapine (SEROQUEL) 50 MG tablet Take 1 tablet (50 mg total) by mouth 3 (three) times daily. 90 tablet 5  . sertraline (ZOLOFT) 100 MG tablet Take two tablets (200mg ) by mouth once daily 60 tablet 3  . simvastatin (ZOCOR) 20 MG tablet Take 20 mg by mouth daily.    .  sitaGLIPtin (JANUVIA) 100 MG tablet Take 100 mg by mouth daily.    . solifenacin (VESICARE) 5 MG tablet Take 5 mg by mouth daily.    Marland Kitchen gabapentin (NEURONTIN) 100 MG capsule Take 1 capsule (100 mg total) by mouth 3 (three) times daily. 90 capsule 5  . lamoTRIgine (LAMICTAL) 100 MG tablet Take 100 mg by mouth at bedtime.    Marland Kitchen loratadine (CLARITIN) 10 MG tablet Take 1 tablet (10 mg total) by mouth daily. 30 tablet 5  . LORazepam (ATIVAN) 0.5 MG tablet Take 1 tablet (0.5 mg total) by mouth every 8 (eight) hours. 90 tablet 5  . lurasidone (LATUDA) 40 MG TABS tablet Take 1 tablet (40 mg total) by mouth daily with breakfast. 30 tablet 5  . naproxen (NAPROSYN) 500 MG tablet Take 1 tablet (500 mg total) by mouth 2 (two) times daily as needed for moderate pain (not to exceed 2 doses in 24 hours). 60 tablet 5  . QUEtiapine (SEROQUEL) 100 MG tablet Take 1 tablet (100 mg total) by mouth every 6 (six) hours as needed (agitation). 60 tablet 3  . QUEtiapine (SEROQUEL) 400 MG tablet Take 1 tablet (400 mg total) by mouth at bedtime. 30 tablet 3   No current facility-administered medications for this visit.    Lab Results: No results found for this or any previous visit (from the past 48 hour(s)).  Physical Findings: AIMS:  , ,  ,  ,    CIWA:    COWS:     Musculoskeletal: Strength & Muscle Tone: within normal limits Gait & Station: normal Patient leans: N/A  Psychiatric Specialty Exam: ROS  Blood pressure 98/60, pulse 58, temperature 97.8 F (36.6 C), temperature source Tympanic, height 5' 6.5" (1.689 m), weight 221 lb 0.2 oz (100.249 kg), SpO2 95 %.Body mass index is 35.14 kg/(m^2).  General Appearance: Casual  Eye Contact::  Fair  Speech:  Pressured  Volume:  Normal  Mood:  Irritable  Affect:  Constricted  Thought Process:  Tangential  Orientation:  Full (Time, Place, and Person)  Thought Content:  Negative  Suicidal Thoughts:  No  Homicidal Thoughts:  No  Memory:  Immediate;   Good Recent;    Fair Remote;   Fair  Judgement:  Fair  Insight:  Fair  Psychomotor Activity:  Normal  Concentration:  Poor  Recall:  [employer] of Knowledge:Fair  Language: Fair  Akathisia:  No  Handed:  Right  AIMS (if indicated):     Assets:  Communication Skills Desire for Improvement Financial Resources/Insurance [place] Talents/Skills  ADL's:  Intact  Cognition: Impaired,  Mild  Sleep:      Treatment Plan Summary: Medication management and Plan We have struggled to find a good compromise with medication and outpatient  management to keep the patient functioning as well as possible and avoid hospitalization. The group home he is in right now to my mind continues to be an excellent place for him and have a lot of patience for his behavior. He still gets agitated at times. We reviewed medication. Continue the Ativan as a 3 times a day thing. Continue his multiple other mood stabilizers and antipsychotics. Medicines specifically reviewed with staff. Follow-up in another 3 months  [place] 06/25/2015, 5:02 PM

## 2015-07-28 ENCOUNTER — Ambulatory Visit: Payer: Medicare Other | Admitting: Psychiatry

## 2015-08-03 ENCOUNTER — Encounter: Payer: Self-pay | Admitting: Psychiatry

## 2015-08-03 ENCOUNTER — Ambulatory Visit (INDEPENDENT_AMBULATORY_CARE_PROVIDER_SITE_OTHER): Payer: Medicare Other | Admitting: Psychiatry

## 2015-08-03 VITALS — BP 124/78 | HR 71 | Temp 97.2°F | Ht 68.0 in | Wt 233.8 lb

## 2015-08-03 DIAGNOSIS — F840 Autistic disorder: Secondary | ICD-10-CM | POA: Diagnosis not present

## 2015-08-03 DIAGNOSIS — F250 Schizoaffective disorder, bipolar type: Secondary | ICD-10-CM | POA: Diagnosis not present

## 2015-08-03 MED ORDER — QUETIAPINE FUMARATE 100 MG PO TABS
100.0000 mg | ORAL_TABLET | Freq: Three times a day (TID) | ORAL | Status: DC
Start: 2015-08-03 — End: 2016-01-25

## 2015-08-03 MED ORDER — QUETIAPINE FUMARATE 400 MG PO TABS: 400.0000 mg | ORAL_TABLET | Freq: Every day | ORAL | Status: DC

## 2015-08-03 NOTE — Progress Notes (Signed)
[facility] MD Progress Note  08/03/2015 7:16 PM SEANANTHONY REEL  MRN:  [id number] Subjective:  Follow-up for 60 year old man with a history of autistic disorder and schizoaffective disorder. Patient is present accompanied by the owner of his group home. Patient begins in a fairly appropriate manner wanting to discuss his usual complaints. He has written out a list of concerns and has a plan that is actually relatively reasonable. After conversing for a few minutes the patient suddenly became very agitated and jumped up out of his chair and ran screaming outside of the office and out into the parking lot. Eventually I was able to catch up with him and convinced him to come back inside. He did not hurt himself or anyone else. Afterwards we were able to discuss in more detail how this sort of behaviors what damages his chances to improve. As usual I think his insight is very limited. We eventually did decide that we would increase his dose of Seroquel however. He tells me he thinks that medicine in particular has been helpful for him. Principal Problem: @PPROB @ Diagnosis:   Patient Active Problem List   Diagnosis Date Noted  . Diabetes ([place]) [E11.9] 04/10/2015  . Hypertension [I10] 04/10/2015  . Prostate hypertrophy [N40.0] 04/10/2015  . Schizoaffective disorder ([place]) [F25.9] 04/10/2015  . Enlarged prostate [N40.0] 04/10/2015  . Essential (primary) hypertension [I10] 04/10/2015  . Type 2 diabetes mellitus ([place]) [E11.9] 04/10/2015  . Schizoaffective disorder, bipolar type ([place]) [F25.0] 12/29/2014  . Active autistic disorder [F84.0] 12/29/2014   Total Time spent with patient: 30 minutes  Past Psychiatric History: Long history of mental health problems essentially lifelong. Long diagnosed as schizoaffective it's my feeling that he has more of an autistic kind of presentation  Past Medical History:  Past Medical History  Diagnosis Date  . Schizophrenia, schizoaffective ([place])   . Autistic disorder,  residual state   . Anxiety disorder due to known physiological condition   . Anxiety   . Esophageal reflux   . Diabetes mellitus without complication ([place])   . Hypertension   . Developmental disorder   . Arthritis   . Dyslipidemia   . Sleep apnea   . Overactive bladder   . Urinary incontinence     Past Surgical History  Procedure Laterality Date  . Tonsillectomy     Family History:  Family History  Problem Relation Age of Onset  . Family history unknown: Yes   Family Psychiatric  History: None Social History:  History  Alcohol Use No     History  Drug Use No    Social History   Social History  . Marital Status: Single    Spouse Name: N/A  . Number of Children: N/A  . Years of Education: N/A   Social History Main Topics  . Smoking status: Never Smoker   . Smokeless tobacco: Never Used  . Alcohol Use: No  . Drug Use: No  . Sexual Activity: No   Other Topics Concern  . None   Social History Narrative   The patient never finished high school but did get his GED. 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 He is currently in disability and [name] is his legal guardian. He has been living in group homes for many years.      No pending legal charges   Additional Social History:  Sleep: Fair  Appetite:  Fair  Current Medications: Current Outpatient Prescriptions  Medication Sig Dispense Refill  . Cholecalciferol (VITAMIN D-1000 MAX ST) 1000 UNITS tablet Take 5,000 Units by mouth. Reported on 08/03/2015    . divalproex (DEPAKOTE ER) 500 MG 24 hr tablet Take 4 tablets (2,000 mg total) by mouth at bedtime. 120 tablet 5  . furosemide (LASIX) 20 MG tablet Take by mouth.    . gabapentin (NEURONTIN) 100 MG capsule Take 1 capsule (100 mg total) by mouth 3 (three) times daily. 90 capsule 5  . insulin glargine (LANTUS) 100 UNIT/ML injection Inject 0.12 mLs (12 Units  total) into the skin at bedtime. 30 mL 0  . lamoTRIgine (LAMICTAL) 100 MG tablet Take 100 mg by mouth at bedtime.    Marland Kitchen lisinopril (PRINIVIL,ZESTRIL) 5 MG tablet Take by mouth.    . loratadine (CLARITIN) 10 MG tablet Take 1 tablet (10 mg total) by mouth daily. 30 tablet 5  . LORazepam (ATIVAN) 0.5 MG tablet Take 1 tablet (0.5 mg total) by mouth every 8 (eight) hours. 90 tablet 5  . lurasidone (LATUDA) 40 MG TABS tablet Take 1 tablet (40 mg total) by mouth daily with breakfast. 30 tablet 5  . metoprolol succinate (TOPROL-XL) 50 MG 24 hr tablet Take by mouth.    . naproxen (NAPROSYN) 500 MG tablet Take 1 tablet (500 mg total) by mouth 2 (two) times daily as needed for moderate pain (not to exceed 2 doses in 24 hours). 60 tablet 5  . QUEtiapine (SEROQUEL) 100 MG tablet Take 1 tablet (100 mg total) by mouth every 6 (six) hours as needed (agitation). 60 tablet 3  . QUEtiapine (SEROQUEL) 100 MG tablet Take 1 tablet (100 mg total) by mouth 3 (three) times daily. 90 tablet 5  . QUEtiapine (SEROQUEL) 400 MG tablet Take 1 tablet (400 mg total) by mouth at bedtime. 30 tablet 3  . sertraline (ZOLOFT) 100 MG tablet Take two tablets (200mg ) by mouth once daily 60 tablet 3  . simvastatin (ZOCOR) 20 MG tablet Take 20 mg by mouth daily.    . sitaGLIPtin (JANUVIA) 100 MG tablet Take 100 mg by mouth daily.    . solifenacin (VESICARE) 5 MG tablet Take 5 mg by mouth daily.     No current facility-administered medications for this visit.    Lab Results: No results found for this or any previous visit (from the past 48 hour(s)).  Physical Findings: AIMS:  , ,  ,  ,    CIWA:    COWS:     Musculoskeletal: Strength & Muscle Tone: within normal limits Gait & Station: normal Patient leans: N/A  Psychiatric Specialty Exam: ROS  Blood pressure 124/78, pulse 71, temperature 97.2 F (36.2 C), temperature source Tympanic, height 5\' 8"  (1.727 m), weight 233 lb 12.8 oz (106.051 kg), SpO2 94 %.Body mass index is 35.56  kg/(m^2).  General Appearance: Casual  Eye Contact::  Fair  Speech:  Pressured  Volume:  Increased  Mood:  Irritable  Affect:  Labile  Thought Process:  Circumstantial and Tangential  Orientation:  Full (Time, Place, and Person)  Thought Content:  Obsessions and Rumination  Suicidal Thoughts:  No  Homicidal Thoughts:  No  Memory:  Immediate;   Fair Recent;   Fair Remote;   Fair  Judgement:  Poor  Insight:  Shallow  Psychomotor Activity:  Restlessness  Concentration:  Fair  Recall:  [place]  Language: Fair  Akathisia:  No  Handed:  Right  AIMS (if indicated):     Assets:  Desire for Improvement Financial Resources/Insurance Housing Resilience Social Support  ADL's:  Intact  Cognition: Impaired,  Mild  Sleep:      Treatment Plan Summary: Medication management and Plan patient continues to display this kind of agitated behavior which while it is not dangerous particularly to others can sometimes put him in a dangerous situation and certainly makes him inappropriate to be in public. His insight into this is partial. He talks a little bit about having voices but then clarifies that he doesn't think they are really voices so much is his own thoughts. Patient did state that he thinks the Seroquel had been at least partially helpful. We will go ahead and increase the 50 mg 3 times a day to 100 mg 3 times a day. I will see him back in 3 months. Reviewed with the group home owner. Patient agrees to the plan.  John [name] 08/03/2015, 7:16 PM

## 2015-08-07 ENCOUNTER — Telehealth: Payer: Self-pay | Admitting: Psychiatry

## 2015-08-07 NOTE — Telephone Encounter (Signed)
I attempted to call [facility] to clarify this but they were closed. They do not have a voice mail system. I believe that I returned the faxed form that they sent to clarify that we were in fact increasing the dose.

## 2015-10-26 ENCOUNTER — Encounter: Payer: Self-pay | Admitting: Psychiatry

## 2015-10-26 ENCOUNTER — Ambulatory Visit (INDEPENDENT_AMBULATORY_CARE_PROVIDER_SITE_OTHER): Payer: Medicare Other | Admitting: Psychiatry

## 2015-10-26 ENCOUNTER — Other Ambulatory Visit: Payer: Self-pay

## 2015-10-26 ENCOUNTER — Ambulatory Visit: Payer: Medicare Other | Admitting: Psychiatry

## 2015-10-26 VITALS — BP 118/78 | HR 64 | Temp 97.5°F | Ht 68.0 in | Wt 232.2 lb

## 2015-10-26 DIAGNOSIS — F840 Autistic disorder: Secondary | ICD-10-CM | POA: Diagnosis not present

## 2015-10-26 DIAGNOSIS — F250 Schizoaffective disorder, bipolar type: Secondary | ICD-10-CM

## 2015-10-26 NOTE — Progress Notes (Signed)
[facility] MD/PA/NP OP Progress Note  10/26/2015 7:13 PM Alexander Beard  MRN:  [id number]  Chief Complaint:  Chief Complaint    Follow-up; Medication Refill     Subjective:  "I have a lot to talk about" HPI: Patient interviewed. On time for her regularly scheduled appointment. 60 year old man with a history of schizoaffective disorder possible autistic disorder more likely in my opinion. He reports to me today that he actually feels he is overall doing quite well. He likes his medicine and has no complaints about it. He does want to complain that he thinks that his brother speaks to him disrespectfully which is one of his many chronic complaints. He is not feeling overly depressed. He does seem a little bit more sad and down and then I have seen him at other times in the past but I am reassured that he still is able to enjoy a lot of his normal activities and actually has been going to a church program more regularly. Visit Diagnosis:    ICD-9-CM ICD-10-CM   1. Active autistic disorder with active but odd behavior 299.00 F84.0   2. Schizoaffective disorder, bipolar type ([place]) 295.70 F25.0     Past Psychiatric History: Long-standing history of mental health problems. No history of suicide attempts. He is on a complex regimen of medicines but generally tolerates them well  Past Medical History:  Past Medical History  Diagnosis Date  . Schizophrenia, schizoaffective ([place])   . Autistic disorder, residual state   . Anxiety disorder due to known physiological condition   . Anxiety   . Esophageal reflux   . Diabetes mellitus without complication ([place])   . Hypertension   . Developmental disorder   . Arthritis   . Dyslipidemia   . Sleep apnea   . Overactive bladder   . Urinary incontinence     Past Surgical History  Procedure Laterality Date  . Tonsillectomy      Family Psychiatric History: No known family history of mental illness  Family History:  Family History  Problem Relation Age of  Onset  . Family history unknown: Yes    Social History:  Social History   Social History  . Marital Status: Single    Spouse Name: N/A  . Number of Children: N/A  . Years of Education: N/A   Social History Main Topics  . Smoking status: Never Smoker   . Smokeless tobacco: Never Used  . Alcohol Use: No  . Drug Use: No  . Sexual Activity: No   Other Topics Concern  . None   Social History Narrative   The patient never finished high school but did get his GED. 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 He is currently in disability and [name] is his legal guardian. He has been living in group homes for many years.      No pending legal charges    Allergies:  Allergies  Allergen Reactions  . Cortizone-10  [Hydrocortisone] Other (See Comments)  . Other Other (See Comments)    MYACINS  . Penicillins Nausea And Vomiting    Metabolic Disorder Labs: Lab Results  Component Value Date   HGBA1C 5.6 04/10/2015   No results found for: PROLACTIN Lab Results  Component Value Date   CHOL 153 04/10/2015   TRIG 166* 04/10/2015   HDL 50 04/10/2015   CHOLHDL 3.1 04/10/2015   VLDL 33 04/10/2015   LDLCALC 70  04/10/2015     Current Medications: Current Outpatient Prescriptions  Medication Sig Dispense Refill  . Cholecalciferol (VITAMIN D-1000 MAX ST) 1000 UNITS tablet Take 5,000 Units by mouth. Reported on 08/03/2015    . divalproex (DEPAKOTE ER) 500 MG 24 hr tablet Take 4 tablets (2,000 mg total) by mouth at bedtime. 120 tablet 5  . furosemide (LASIX) 20 MG tablet Take by mouth.    . gabapentin (NEURONTIN) 100 MG capsule Take 1 capsule (100 mg total) by mouth 3 (three) times daily. 90 capsule 5  . insulin glargine (LANTUS) 100 UNIT/ML injection Inject 0.12 mLs (12 Units total) into the skin at bedtime. 30 mL 0  . lamoTRIgine (LAMICTAL) 100 MG tablet Take 100 mg by mouth at bedtime.    Marland Kitchen lisinopril  (PRINIVIL,ZESTRIL) 5 MG tablet Take by mouth.    . loratadine (CLARITIN) 10 MG tablet Take 1 tablet (10 mg total) by mouth daily. 30 tablet 5  . LORazepam (ATIVAN) 0.5 MG tablet Take 1 tablet (0.5 mg total) by mouth every 8 (eight) hours. 90 tablet 5  . lurasidone (LATUDA) 40 MG TABS tablet Take 1 tablet (40 mg total) by mouth daily with breakfast. 30 tablet 5  . metoprolol succinate (TOPROL-XL) 50 MG 24 hr tablet Take by mouth.    . naproxen (NAPROSYN) 500 MG tablet Take 1 tablet (500 mg total) by mouth 2 (two) times daily as needed for moderate pain (not to exceed 2 doses in 24 hours). 60 tablet 5  . QUEtiapine (SEROQUEL) 100 MG tablet Take 1 tablet (100 mg total) by mouth every 6 (six) hours as needed (agitation). 60 tablet 3  . QUEtiapine (SEROQUEL) 100 MG tablet Take 1 tablet (100 mg total) by mouth 3 (three) times daily. 90 tablet 5  . QUEtiapine (SEROQUEL) 400 MG tablet Take 1 tablet (400 mg total) by mouth at bedtime. 30 tablet 3  . sertraline (ZOLOFT) 100 MG tablet Take two tablets (200mg ) by mouth once daily 60 tablet 3  . simvastatin (ZOCOR) 20 MG tablet Take 20 mg by mouth daily.    . sitaGLIPtin (JANUVIA) 100 MG tablet Take 100 mg by mouth daily.    . solifenacin (VESICARE) 5 MG tablet Take 5 mg by mouth daily.     No current facility-administered medications for this visit.    Neurologic: Headache: No Seizure: No Paresthesias: No  Musculoskeletal: Strength & Muscle Tone: within normal limits Gait & Station: normal Patient leans: N/A  Psychiatric Specialty Exam: ROS  Blood pressure 118/78, pulse 64, temperature 97.5 F (36.4 C), temperature source Tympanic, height 5\' 8"  (1.727 m), weight 232 lb 3.2 oz (105.325 kg), SpO2 95 %.Body mass index is 35.31 kg/(m^2).  General Appearance: Fairly Groomed  Eye Contact:  Good  Speech:  Clear and Coherent  Volume:  Increased  Mood:  Dysphoric  Affect:  Full Range  Thought Process:  Tangential  Orientation:  Full (Time, Place,  and Person)  Thought Content:  Negative  Suicidal Thoughts:  No  Homicidal Thoughts:  No  Memory:  Immediate;   Good Recent;   Fair Remote;   Fair  Judgement:  Fair  Insight:  Fair  Psychomotor Activity:  Normal  Concentration:  Fair  Recall:  [employer] of Knowledge: Fair  Language: Fair  Akathisia:  No  Handed:  Right  AIMS (if indicated):     Assets:  Communication Skills Desire for Improvement Financial Resources/Insurance Housing Resilience Social Support  ADL's:  Intact  Cognition: WNL  Sleep:  Good     Treatment Plan Summary:Medication management and Plan patient will be continued on his usual medications. As far as we can tell looks like everything is in shape without needing any refills. They will let me know if there are any needs that,. Supportive therapy and encouragement as always. Follow-up in another 3 months.   Alethia Berthold, MD 10/26/2015, 7:13 PM

## 2015-10-26 NOTE — Telephone Encounter (Signed)
received a fax requesting refill on lorazepam .5mg  take 1 tablet by mouth 3 times a day.

## 2015-10-28 MED ORDER — LORAZEPAM 0.5 MG PO TABS: 0.5000 mg | ORAL_TABLET | Freq: Three times a day (TID) | ORAL | Status: DC

## 2015-10-28 NOTE — Telephone Encounter (Signed)
per [name] it ok to refill., called in a verbal order

## 2015-11-27 ENCOUNTER — Telehealth: Payer: Self-pay

## 2015-11-27 NOTE — Telephone Encounter (Signed)
received a request by fax for a refill quetiapine 400mg  . pt has an appt in june

## 2015-11-27 NOTE — Telephone Encounter (Signed)
rx called in for a refill

## 2015-12-28 ENCOUNTER — Telehealth: Payer: Self-pay

## 2015-12-28 NOTE — Telephone Encounter (Signed)
received fax request for refill on quetiapine fumarate 400mg  and quetiapine fumarate 100mg 

## 2015-12-28 NOTE — Telephone Encounter (Signed)
called in ok for refills for both medication. pt has appt next month.

## 2016-01-25 ENCOUNTER — Ambulatory Visit (INDEPENDENT_AMBULATORY_CARE_PROVIDER_SITE_OTHER): Payer: Medicare Other | Admitting: Psychiatry

## 2016-01-25 ENCOUNTER — Encounter: Payer: Self-pay | Admitting: Psychiatry

## 2016-01-25 VITALS — BP 110/72 | HR 72 | Temp 97.4°F | Ht 68.0 in | Wt 230.2 lb

## 2016-01-25 DIAGNOSIS — F250 Schizoaffective disorder, bipolar type: Secondary | ICD-10-CM | POA: Diagnosis not present

## 2016-01-25 DIAGNOSIS — F259 Schizoaffective disorder, unspecified: Secondary | ICD-10-CM

## 2016-01-25 DIAGNOSIS — F840 Autistic disorder: Secondary | ICD-10-CM | POA: Diagnosis not present

## 2016-01-25 MED ORDER — LORAZEPAM 0.5 MG PO TABS: 0.5000 mg | ORAL_TABLET | Freq: Three times a day (TID) | ORAL | Status: DC

## 2016-01-25 MED ORDER — QUETIAPINE FUMARATE 400 MG PO TABS: 400.0000 mg | ORAL_TABLET | Freq: Every day | ORAL | Status: DC

## 2016-01-25 MED ORDER — QUETIAPINE FUMARATE 100 MG PO TABS: 100.0000 mg | ORAL_TABLET | Freq: Three times a day (TID) | ORAL | Status: DC

## 2016-01-25 MED ORDER — DIVALPROEX SODIUM ER 500 MG PO TB24: 2000.0000 mg | ORAL_TABLET | Freq: Every day | ORAL | Status: DC

## 2016-01-25 MED ORDER — GABAPENTIN 100 MG PO CAPS: 100.0000 mg | ORAL_CAPSULE | Freq: Three times a day (TID) | ORAL | Status: DC

## 2016-01-25 MED ORDER — QUETIAPINE FUMARATE 100 MG PO TABS: 100.0000 mg | ORAL_TABLET | Freq: Four times a day (QID) | ORAL | Status: DC | PRN

## 2016-01-25 MED ORDER — LURASIDONE HCL 40 MG PO TABS: 40.0000 mg | ORAL_TABLET | Freq: Every day | ORAL | Status: DC

## 2016-01-25 MED ORDER — LAMOTRIGINE 100 MG PO TABS: 100.0000 mg | ORAL_TABLET | Freq: Every day | ORAL | Status: DC

## 2016-01-25 MED ORDER — SERTRALINE HCL 100 MG PO TABS: ORAL_TABLET | ORAL | Status: DC

## 2016-01-25 NOTE — Progress Notes (Signed)
Patient ID: Alexander Beard, male   DOB: Jul 20, 1955, 60 y.o.   MRN: [id number] Follow-up for 60 year old man with history of schizoaffective disorder and probable autistic spectrum disorder. Patient reports that generally he is doing quite well. He has no subjective complaints about his mood. He thinks he is tolerating his medication well. He mainly wants to talk with me about his desire to go back to community college. He had been barred from [employer] college last year after his behavior problems. We talked about how he needs to approach this matter gently and with the assistance of his brother to ask the school if they would consider re-excepting him. [name] appeared to take this fairly well especially as compared to how he often takes frustration.  He has no physical complaints right now. Physically feeling well.  Patient is neatly dressed and does not appear to be dirty which is good. Pretty good eye contact. Normal psychomotor activity. Speech normal for him. Flat tone which is typical. Slight degree of irritability but he is keeping it under control quite well. No evident thought disorder. Denies suicidal ideation. Seems to be doing a pretty good job as he says of controlling his impulsivity although I know from his group home owner that he has still had some episodes of impulsivity recently.  We reviewed his medicine. He is on quite a bit but he tolerates it well including the when necessary Seroquel. Renewed everything including the Ativan. Follow-up in 3 months. He is asking about going to see a therapist which I think is quite reasonable. I suggested they look into seeing Miguel Dibble or the therapist here in our office.

## 2016-02-02 ENCOUNTER — Ambulatory Visit: Payer: Medicare Other | Admitting: Psychiatry

## 2016-03-15 ENCOUNTER — Telehealth: Payer: Self-pay | Admitting: Psychiatry

## 2016-03-18 ENCOUNTER — Other Ambulatory Visit: Payer: Self-pay | Admitting: Psychiatry

## 2016-03-18 NOTE — Telephone Encounter (Signed)
I called [employer] and the person I spoke to had no idea what the problem was. I spoke to the manager of Alexander Beard's group home. Apparently the insurance is declining to pay because of the number of different Seroquel prescriptions and something about the way the when necessary is written. She did not know specifically what could be changed. Eventually we may need to find some work around to this. We will keep on trying to research it in the meantime.

## 2016-03-29 ENCOUNTER — Encounter: Payer: Self-pay | Admitting: Emergency Medicine

## 2016-03-29 ENCOUNTER — Emergency Department
Admission: EM | Admit: 2016-03-29 | Discharge: 2016-04-01 | Disposition: A | Discharge status: Home / Self Care | Payer: Medicare Other | Attending: Emergency Medicine | Admitting: Emergency Medicine

## 2016-03-29 DIAGNOSIS — E119 Type 2 diabetes mellitus without complications: Secondary | ICD-10-CM | POA: Diagnosis not present

## 2016-03-29 DIAGNOSIS — R45850 Homicidal ideations: Secondary | ICD-10-CM | POA: Diagnosis not present

## 2016-03-29 DIAGNOSIS — I10 Essential (primary) hypertension: Secondary | ICD-10-CM | POA: Diagnosis not present

## 2016-03-29 DIAGNOSIS — R441 Visual hallucinations: Secondary | ICD-10-CM

## 2016-03-29 DIAGNOSIS — F840 Autistic disorder: Secondary | ICD-10-CM | POA: Diagnosis not present

## 2016-03-29 DIAGNOSIS — Z794 Long term (current) use of insulin: Secondary | ICD-10-CM | POA: Diagnosis not present

## 2016-03-29 DIAGNOSIS — F259 Schizoaffective disorder, unspecified: Secondary | ICD-10-CM | POA: Diagnosis present

## 2016-03-29 DIAGNOSIS — Z79899 Other long term (current) drug therapy: Secondary | ICD-10-CM | POA: Diagnosis not present

## 2016-03-29 DIAGNOSIS — R4182 Altered mental status, unspecified: Secondary | ICD-10-CM | POA: Diagnosis present

## 2016-03-29 DIAGNOSIS — R45851 Suicidal ideations: Secondary | ICD-10-CM | POA: Diagnosis not present

## 2016-03-29 LAB — CBC
HEMATOCRIT: 36.1 % — AB (ref 40.0–52.0)
Hemoglobin: 12.6 g/dL — ABNORMAL LOW (ref 13.0–18.0)
MCH: 33.3 pg (ref 26.0–34.0)
MCHC: 34.9 g/dL (ref 32.0–36.0)
MCV: 95.4 fL (ref 80.0–100.0)
PLATELETS: 195 10*3/uL (ref 150–440)
RBC: 3.78 MIL/uL — ABNORMAL LOW (ref 4.40–5.90)
RDW: 12.6 % (ref 11.5–14.5)
WBC: 7.6 10*3/uL (ref 3.8–10.6)

## 2016-03-29 LAB — COMPREHENSIVE METABOLIC PANEL
ALK PHOS: 52 U/L (ref 38–126)
ALT: 8 U/L — AB (ref 17–63)
AST: 22 U/L (ref 15–41)
Albumin: 3.5 g/dL (ref 3.5–5.0)
Anion gap: 5 (ref 5–15)
BILIRUBIN TOTAL: 0.6 mg/dL (ref 0.3–1.2)
BUN: 27 mg/dL — AB (ref 6–20)
CO2: 31 mmol/L (ref 22–32)
CREATININE: 1.41 mg/dL — AB (ref 0.61–1.24)
Calcium: 8.9 mg/dL (ref 8.9–10.3)
Chloride: 99 mmol/L — ABNORMAL LOW (ref 101–111)
GFR calc Af Amer: >60 mL/min (ref >60)
GFR, EST NON AFRICAN AMERICAN: 53 mL/min — AB (ref >60)
GLUCOSE: 84 mg/dL (ref 65–99)
Potassium: 4.6 mmol/L (ref 3.5–5.1)
Sodium: 135 mmol/L (ref 135–145)
TOTAL PROTEIN: 7 g/dL (ref 6.5–8.1)

## 2016-03-29 LAB — URINE DRUG SCREEN, QUALITATIVE (ARMC ONLY)
Amphetamines, Ur Screen: NOT DETECTED
BARBITURATES, UR SCREEN: NOT DETECTED
Benzodiazepine, Ur Scrn: POSITIVE — AB
Cannabinoid 50 Ng, Ur ~~LOC~~: NOT DETECTED
Cocaine Metabolite,Ur ~~LOC~~: NOT DETECTED
MDMA (ECSTASY) UR SCREEN: NOT DETECTED
Methadone Scn, Ur: NOT DETECTED
OPIATE, UR SCREEN: NOT DETECTED
Phencyclidine (PCP) Ur S: NOT DETECTED
Tricyclic, Ur Screen: POSITIVE — AB

## 2016-03-29 LAB — ACETAMINOPHEN LEVEL: Acetaminophen (Tylenol), Serum: <10 ug/mL — ABNORMAL LOW (ref 10–30)

## 2016-03-29 LAB — ETHANOL

## 2016-03-29 LAB — SALICYLATE LEVEL: Salicylate Lvl: <4 mg/dL (ref 2.8–30.0)

## 2016-03-29 NOTE — ED Triage Notes (Addendum)
Pt to ed with police and group home owner.  Per group home director pt left the home today and went to the police dept and stated he was not happy living at group home.  Pt also told police he wanted to die.  Group home is [facility].

## 2016-03-29 NOTE — BH Assessment (Signed)
Assessment Note  Alexander Beard is an 60 y.o. male. Pt was brought into the ED by the police. Pt reports "I'm not happy where I'm living. We hardly have anything to do. We stay there. I need more recreations. I would like to go to [place] [employer] and to [employer]...". Pt states he walked out of the group home today and went to the police station when he became upset with a staff member after she would not give him a certain medication. He states he became "extra nervous and upset". Reggy states he has been "losing" several things lately. When pt was asked what has he lost, he reported "I've lost the [employer] circus and a parade".  Patient reports feelings of depression due to not being able to go out into the community more often. He states he "constantly" feels sad and down. Pt denied having any concerns with his sleep patterns and/or appetite. When pt becomes upset he reports "yelling, throwing things, running, and screaming". Pt's brother states pt has never been physically aggressive with staff and other residence.  Pt reports intrusive thoughts of SI/HI; however, pt states "it scares me to think about that". He state she has never acted on these thoughts in the past. Pt denied hallucinations/delusions. Pt then began to threaten that "something would happen" if he was not sent to another group home. Pt became agitated and began yelling "the pressure will overwhelm me eventually and I don't know what I'll do ... I can't stand it anymore".  Diagnosis:  Schizoaffective Disorder, by history Anxiety Disorder  Past Medical History:  Past Medical History:  Diagnosis Date  . Anxiety   . Anxiety disorder due to known physiological condition   . Arthritis   . Autistic disorder, residual state   . Developmental disorder   . Diabetes mellitus without complication ([place])   . Dyslipidemia   . Esophageal reflux   . Hypertension   . Overactive bladder   . Schizophrenia, schizoaffective  ([place])   . Sleep apnea   . Urinary incontinence     Past Surgical History:  Procedure Laterality Date  . TONSILLECTOMY      Family History:  Family History  Problem Relation Age of Onset  . Family history unknown: Yes    Social History:  reports that he has never smoked. He has never used smokeless tobacco. He reports that he does not drink alcohol or use drugs.  Additional Social History:  Alcohol / Drug Use Pain Medications: None Reported Prescriptions: Zoloft, Seroquel, Depakote, Neurontin Over the Counter: None Reported History of alcohol / drug use?: No history of alcohol / drug abuse  CIWA: CIWA-Ar BP: (!) 108/53 Pulse Rate: (!) 58 COWS:    Allergies:  Allergies  Allergen Reactions  . Cortizone-10  [Hydrocortisone] Other (See Comments)  . Other Other (See Comments)    MYACINS  . Penicillins Nausea And Vomiting    Home Medications:  (Not in a hospital admission)  OB/GYN Status:  No LMP for male patient.  General Assessment Data Location of Assessment: [facility] ED TTS Assessment: In system Is this a Tele or Face-to-Face Assessment?: Face-to-Face Is this an Initial Assessment or a Re-assessment for this encounter?: Initial Assessment Marital status: Other (comment) (Unknown) Maiden name: N/A Is patient pregnant?: No Pregnancy Status: No Living Arrangements: Group Home ([place]) Can pt return to current living arrangement?: Yes Admission Status: Voluntary Is patient capable of signing voluntary admission?: No Referral Source: Self/Family/Friend Google  type: Medicare  Medical Screening Exam ([place]) Medical Exam completed: Yes  Crisis Care Plan Living Arrangements: Group Home ([place]) Legal Guardian: Other: (Self) Name of Psychiatrist: [name] Name of Therapist: None Reported  Education Status Is patient currently in school?: No Current Grade: N/A Highest grade of school patient has completed:  [place] Name of school: N/A Contact person: N/A  Risk to self with the past 6 months Suicidal Ideation: No Has patient been a risk to self within the past 6 months prior to admission? : No Suicidal Intent: No Has patient had any suicidal intent within the past 6 months prior to admission? : No Is patient at risk for suicide?: No Suicidal Plan?: No Has patient had any suicidal plan within the past 6 months prior to admission? : No Access to Means: No What has been your use of drugs/alcohol within the last 12 months?: None Reported Previous Attempts/Gestures: No How many times?: 0 Other Self Harm Risks: None Reported Triggers for Past Attempts: None known Intentional Self Injurious Behavior: None Family Suicide History: No Recent stressful life event(s): Loss (Comment), Other (Comment) (Housing) Persecutory voices/beliefs?: No Depression: Yes Depression Symptoms: Feeling angry/irritable, Tearfulness Substance abuse history and/or treatment for substance abuse?: No Suicide prevention information given to non-admitted patients: Not applicable  Risk to Others within the past 6 months Homicidal Ideation: No (Pt reports having intrusive thoughts) Does patient have any lifetime risk of violence toward others beyond the six months prior to admission? : No Thoughts of Harm to Others: No Current Homicidal Intent: No Current Homicidal Plan: No Access to Homicidal Means: No Identified Victim: N/A History of harm to others?: No Assessment of Violence: None Noted Violent Behavior Description: N/A Does patient have access to weapons?: No Criminal Charges Pending?: No Does patient have a court date: No Is patient on probation?: No  Psychosis Hallucinations: None noted Delusions: None noted  Mental Status Report Appearance/Hygiene: In scrubs, In hospital gown Eye Contact: Good Motor Activity: Unsteady Speech: Loud, Argumentative, Tangential Level of Consciousness: Alert,  Irritable Mood: Irritable Affect: Irritable Anxiety Level: Moderate Thought Processes: Tangential Judgement: Partial Orientation: Person, Place, Time, Situation Obsessive Compulsive Thoughts/Behaviors: Moderate  Cognitive Functioning Concentration: Normal Memory:  (Difficulty with memory) IQ: Average Insight: Poor Impulse Control: Fair Appetite: Good Weight Loss: 0 Weight Gain: 0 Sleep: No Change Total Hours of Sleep: 8 Vegetative Symptoms: None  ADLScreening [facility] Assessment [facility]) Patient's cognitive ability adequate to safely complete daily activities?: Yes Patient able to express need for assistance with ADLs?: Yes Independently performs ADLs?: Yes (appropriate for developmental age)  Prior Inpatient Therapy Prior Inpatient Therapy: Yes Prior Therapy Dates: [facility] - Unable to recall Prior Therapy Facilty/Provider(s): [facility] Reason for Treatment: Depression  Prior Outpatient Therapy Prior Outpatient Therapy: No Prior Therapy Dates: N/A Prior Therapy Facilty/Provider(s): N/A Reason for Treatment: N/A Does patient have an ACCT team?: No Does patient have Intensive In-House Services?  : No Does patient have [facility] services? : No Does patient have P4CC services?: Unknown  ADL Screening (condition at time of admission) Patient's cognitive ability adequate to safely complete daily activities?: Yes Patient able to express need for assistance with ADLs?: Yes Independently performs ADLs?: Yes (appropriate for developmental age)       Abuse/Neglect Assessment (Assessment to be complete while patient is alone) Physical Abuse: Denies Verbal Abuse: Denies Sexual Abuse: Denies Exploitation of patient/patient's resources: Denies Self-Neglect: Denies Values / Beliefs Cultural Requests During Hospitalization: None Spiritual Requests During Hospitalization: None Consults  Spiritual Care Consult Needed: No Social Work Consult Needed: No      Additional Information 1:1  In Past 12 Months?: No CIRT Risk: No Elopement Risk: Yes (Pt ran away from group home) Does patient have medical clearance?: Yes  Child/Adolescent Assessment Running Away Risk: Denies Bed-Wetting: Denies Destruction of Property: Denies Cruelty to Animals: Denies Stealing: Denies Rebellious/Defies Authority: Denies Satanic Involvement: Denies Science writer: Denies Problems at [employer]: Denies Gang Involvement: Denies  Disposition:  Disposition Initial Assessment Completed for this Encounter: Yes Disposition of Patient: Referred to (Psych MD to see) Patient referred to: Other (Comment) (Psych MD to see)  On Site Evaluation by:   Reviewed with Physician:    Frederich Cha 03/29/2016 9:31 PM

## 2016-03-29 NOTE — BHH Counselor (Deleted)
Referral information for Geriatric Placement have been faxed to;   [facility]([phone])   [facility]([phone] or [phone])   [facility]([phone])   [facility]([phone] or [phone])   [facility]([phone])   [facility] ([phone])   [facility] ([phone]-fax)   [facility] ([phone])   [facility] ([phone])   [facility] ([phone])   [facility] ([phone])   [facility] ([phone]. [phone]) 

## 2016-03-29 NOTE — ED Provider Notes (Signed)
[facility] Emergency Department Provider Note  ____________________________________________  Time seen: Approximately 7:05 PM  I have reviewed the triage vital signs and the nursing notes.   HISTORY  Chief Complaint Altered Mental Status   HPI Alexander Beard is a 60 y.o. male history of anxiety, autism, developmental disorder, schizoaffective disorder, hypertension, hyperlipidemia who presents IV C by police for suicidal ideation and psychosis. He lives in a group home and tells me that there is a lady called Cloverly who works there who is very mean to him. Today he was feeling anxious and asked for an anxiety pill and she refused and told him that he had to wait until suppertime. Patient is concerned that this person treats him very poorly. He is also having visions of a wood giant that he saw in the movie in his head which makes him afraid. Patient endorses suicidal ideation and reports his been having thoughts for a very long time. Also has homicidal ideation towards this woman from his group home however reports that he feels very afraid when he has these thoughts as he does not want to act on them. He denies any prior SA. Today he woke left the group home and went to the police station where he was IVC. According to paperwork patient walked into the police department asking for help, saying that he is extremely unhappy with his living situation, saying that he no longer wants to live and that he just wants to die. The police called the group home director and he became very irritated when he arrived at the police department. Patient has not taking any of his medications today. Patient reports compliance with his meds. He has no medical complaints at this time.  Past Medical History:  Diagnosis Date  . Anxiety   . Anxiety disorder due to known physiological condition   . Arthritis   . Autistic disorder, residual state   . Developmental disorder   . Diabetes  mellitus without complication ([place])   . Dyslipidemia   . Esophageal reflux   . Hypertension   . Overactive bladder   . Schizophrenia, schizoaffective ([place])   . Sleep apnea   . Urinary incontinence     Patient Active Problem List   Diagnosis Date Noted  . Diabetes ([place]) 04/10/2015  . Hypertension 04/10/2015  . Prostate hypertrophy 04/10/2015  . Schizoaffective disorder ([place]) 04/10/2015  . Enlarged prostate 04/10/2015  . Essential (primary) hypertension 04/10/2015  . Type 2 diabetes mellitus ([place]) 04/10/2015  . Controlled type 2 diabetes mellitus without complication ([place]) 0000000  . Schizoaffective disorder, bipolar type ([place]) 12/29/2014  . Active autistic disorder 12/29/2014    Past Surgical History:  Procedure Laterality Date  . TONSILLECTOMY      Prior to Admission medications   Medication Sig Start Date End Date Taking? Authorizing Provider  Cholecalciferol (VITAMIN D-1000 MAX ST) 1000 UNITS tablet Take 5,000 Units by mouth. Reported on 08/03/2015    Historical Provider, MD  divalproex (DEPAKOTE ER) 500 MG 24 hr tablet Take 4 tablets (2,000 mg total) by mouth at bedtime. 01/25/16   Gonzella Lex, MD  furosemide (LASIX) 20 MG tablet Take by mouth.    Historical Provider, MD  gabapentin (NEURONTIN) 100 MG capsule Take 1 capsule (100 mg total) by mouth 3 (three) times daily. 01/25/16   Gonzella Lex, MD  insulin glargine (LANTUS) 100 UNIT/ML injection Inject 0.12 mLs (12 Units total) into the skin at bedtime. 04/16/15   Hildred Priest,  MD  lamoTRIgine (LAMICTAL) 100 MG tablet Take 1 tablet (100 mg total) by mouth at bedtime. 01/25/16   Gonzella Lex, MD  lisinopril (PRINIVIL,ZESTRIL) 5 MG tablet Take by mouth.    Historical Provider, MD  loratadine (CLARITIN) 10 MG tablet Take 1 tablet (10 mg total) by mouth daily. 04/30/15   Gonzella Lex, MD  LORazepam (ATIVAN) 0.5 MG tablet Take 1 tablet (0.5 mg total) by mouth 3 (three) times daily. 01/25/16   Gonzella Lex, MD   lurasidone (LATUDA) 40 MG TABS tablet Take 1 tablet (40 mg total) by mouth daily with breakfast. 01/25/16   Gonzella Lex, MD  metoprolol succinate (TOPROL-XL) 50 MG 24 hr tablet Take by mouth.    Historical Provider, MD  naproxen (NAPROSYN) 500 MG tablet Take 1 tablet (500 mg total) by mouth 2 (two) times daily as needed for moderate pain (not to exceed 2 doses in 24 hours). 04/30/15   Gonzella Lex, MD  QUEtiapine (SEROQUEL) 100 MG tablet Take 1 tablet (100 mg total) by mouth every 6 (six) hours as needed (agitation). 01/25/16   Gonzella Lex, MD  QUEtiapine (SEROQUEL) 100 MG tablet Take 1 tablet (100 mg total) by mouth 3 (three) times daily. 01/25/16   Gonzella Lex, MD  QUEtiapine (SEROQUEL) 400 MG tablet Take 1 tablet (400 mg total) by mouth at bedtime. 01/25/16   Gonzella Lex, MD  sertraline (ZOLOFT) 100 MG tablet Take two tablets (200mg ) by mouth once daily 01/25/16   Gonzella Lex, MD  simvastatin (ZOCOR) 20 MG tablet Take 20 mg by mouth daily.    Historical Provider, MD  sitaGLIPtin (JANUVIA) 100 MG tablet Take 100 mg by mouth daily.    Historical Provider, MD  solifenacin (VESICARE) 5 MG tablet Take 5 mg by mouth daily.    Historical Provider, MD    Allergies Cortizone-10  [hydrocortisone]; Other; and Penicillins  Family History  Problem Relation Age of Onset  . Family history unknown: Yes    Social History Social History  Substance Use Topics  . Smoking status: Never Smoker  . Smokeless tobacco: Never Used  . Alcohol use No    Review of Systems  Constitutional: Negative for fever. Eyes: Negative for visual changes. ENT: Negative for sore throat. Cardiovascular: Negative for chest pain. Respiratory: Negative for shortness of breath. Gastrointestinal: Negative for abdominal pain, vomiting or diarrhea. Genitourinary: Negative for dysuria. Musculoskeletal: Negative for back pain. Skin: Negative for rash. Neurological: Negative for headaches, weakness or  numbness. Psych: + SI, HI, visual hallucinations, psychosis  ____________________________________________   PHYSICAL EXAM:  VITAL SIGNS: ED Triage Vitals  Enc Vitals Group     BP 03/29/16 1813 (!) 108/53     Pulse Rate 03/29/16 1813 (!) 58     Resp 03/29/16 1813 18     Temp 03/29/16 1813 98.2 F (36.8 C)     Temp Source 03/29/16 1813 Oral     SpO2 03/29/16 1813 100 %     Weight 03/29/16 1802 230 lb (104.3 kg)     Height 03/29/16 1812 5\' 8"  (1.727 m)     Head Circumference --      Peak Flow --      Pain Score 03/29/16 1802 0     Pain Loc --      Pain Edu? --      Excl. in [place]? --     Constitutional: Alert and oriented. Well appearing and in no apparent distress. HEENT:  Head: Normocephalic and atraumatic.         Eyes: Conjunctivae are normal. Sclera is non-icteric. EOMI. PERRL      Mouth/Throat: Mucous membranes are moist.       Neck: Supple with no signs of meningismus. Cardiovascular: Regular rate and rhythm. No murmurs, gallops, or rubs. 2+ symmetrical distal pulses are present in all extremities. No JVD. Respiratory: Normal respiratory effort. Lungs are clear to auscultation bilaterally. No wheezes, crackles, or rhonchi.  Gastrointestinal: Soft, non tender, and non distended with positive bowel sounds. No rebound or guarding. Musculoskeletal: Nontender with normal range of motion in all extremities. No edema, cyanosis, or erythema of extremities. Neurologic: Normal speech and language. Face is symmetric. Moving all extremities. No gross focal neurologic deficits are appreciated. Skin: Skin is warm, dry and intact. No rash noted. Psychiatric: Teary and upset, endorsing suicidal ideation and homicidal ideation, visual hallucinations, and psychosis. Poor insight  ____________________________________________   LABS (all labs ordered are listed, but only abnormal results are displayed)  Labs Reviewed  COMPREHENSIVE METABOLIC PANEL - Abnormal; Notable for the  following:       Result Value   Chloride 99 (*)    BUN 27 (*)    Creatinine, Ser 1.41 (*)    ALT 8 (*)    GFR calc non Af Amer 53 (*)    All other components within normal limits  ACETAMINOPHEN LEVEL - Abnormal; Notable for the following:    Acetaminophen (Tylenol), Serum <10 (*)    All other components within normal limits  CBC - Abnormal; Notable for the following:    RBC 3.78 (*)    Hemoglobin 12.6 (*)    HCT 36.1 (*)    All other components within normal limits  URINE DRUG SCREEN, QUALITATIVE ([facility] ONLY) - Abnormal; Notable for the following:    Tricyclic, Ur Screen POSITIVE (*)    Benzodiazepine, Ur Scrn POSITIVE (*)    All other components within normal limits  ETHANOL  SALICYLATE LEVEL   ____________________________________________  EKG  none ____________________________________________  RADIOLOGY    ____________________________________________   PROCEDURES  Procedure(s) performed: None Procedures Critical Care performed:  None ____________________________________________   INITIAL IMPRESSION / ASSESSMENT AND PLAN / ED COURSE   60 y.o. male history of anxiety, autism, developmental disorder, schizoaffective disorder, hypertension, hyperlipidemia who presents IV C by police for suicidal ideation and psychosis. I'm concerned for patient safety at this time. We'll keep the IVC paperwork, check basic labs for medical clearance and consult psychiatry TTS.  Clinical Course   Patient is medically cleared I waited for psychiatric evaluation.  Pertinent labs & imaging results that were available during my care of the patient were reviewed by me and considered in my medical decision making (see chart for details).    ____________________________________________   FINAL CLINICAL IMPRESSION(S) / ED DIAGNOSES  Final diagnoses:  Suicidal ideation  Visual hallucinations  Homicidal ideation      NEW MEDICATIONS STARTED DURING THIS VISIT:  New  Prescriptions   No medications on file     Note:  This document was prepared using Dragon voice recognition software and may include unintentional dictation errors.    Rudene Re, MD 03/29/16 [phone]

## 2016-03-29 NOTE — ED Notes (Signed)
Pt with compression socks on at this time.  Pt states he takes them off at night.  Pt put non-skid socks on over compression hose.

## 2016-03-29 NOTE — BHH Counselor (Signed)
Collateral Information from pt's brother Trinten Platt: [id number]):  Collateral states pt engages in several recreational activities through his current group home 4 days a week. He states he picks up pt every weekend to engage in additional activities outside of the group home. He reports pt's health has declined in the last couple of years. He reported that the pt "stepped out in front of a car" but denied this being in an attempt to harm himself but due to him "daydreaming". He states pt does not have a steady gait and has had increased falls recently. He states he does not feel his brother is a danger to himself.  Pt's brother was provided with the TTS As[employer] contact number - [phone]. Brother was also provided with pt access password by pt's nurse.

## 2016-03-30 DIAGNOSIS — R441 Visual hallucinations: Secondary | ICD-10-CM | POA: Diagnosis not present

## 2016-03-30 DIAGNOSIS — F840 Autistic disorder: Secondary | ICD-10-CM

## 2016-03-30 LAB — GLUCOSE, CAPILLARY
GLUCOSE-CAPILLARY: 107 mg/dL — AB (ref 65–99)
Glucose-Capillary: 232 mg/dL — ABNORMAL HIGH (ref 65–99)

## 2016-03-30 MED ORDER — LISINOPRIL 5 MG PO TABS
2.5000 mg | ORAL_TABLET | Freq: Every day | ORAL | Status: DC
  Administered 2016-03-30 – 2016-04-01 (×2): 2.5 mg via ORAL
  Filled 2016-03-30 (×3): qty 1

## 2016-03-30 MED ORDER — INSULIN GLARGINE 100 UNIT/ML ~~LOC~~ SOLN
12.0000 [IU] | Freq: Every day | SUBCUTANEOUS | Status: DC
  Administered 2016-03-30: 12 [IU] via SUBCUTANEOUS
  Filled 2016-03-30 (×3): qty 0.12

## 2016-03-30 MED ORDER — DIVALPROEX SODIUM ER 500 MG PO TB24
2000.0000 mg | ORAL_TABLET | Freq: Every day | ORAL | Status: DC
  Administered 2016-03-30 – 2016-03-31 (×2): 2000 mg via ORAL
  Filled 2016-03-30 (×2): qty 4

## 2016-03-30 MED ORDER — HALOPERIDOL LACTATE 5 MG/ML IJ SOLN
INTRAMUSCULAR | Status: AC
  Filled 2016-03-30: qty 1

## 2016-03-30 MED ORDER — LAMOTRIGINE 100 MG PO TABS
100.0000 mg | ORAL_TABLET | Freq: Every day | ORAL | Status: DC
  Administered 2016-03-30 – 2016-03-31 (×2): 100 mg via ORAL
  Filled 2016-03-30 (×2): qty 1

## 2016-03-30 MED ORDER — SERTRALINE HCL 100 MG PO TABS
200.0000 mg | ORAL_TABLET | Freq: Every day | ORAL | Status: DC
  Administered 2016-03-30 – 2016-04-01 (×3): 200 mg via ORAL
  Filled 2016-03-30 (×4): qty 2

## 2016-03-30 MED ORDER — QUETIAPINE FUMARATE 200 MG PO TABS
400.0000 mg | ORAL_TABLET | Freq: Every day | ORAL | Status: DC
  Administered 2016-03-30 – 2016-03-31 (×2): 400 mg via ORAL
  Filled 2016-03-30 (×2): qty 2

## 2016-03-30 MED ORDER — QUETIAPINE FUMARATE 25 MG PO TABS
100.0000 mg | ORAL_TABLET | Freq: Three times a day (TID) | ORAL | Status: DC
  Administered 2016-03-30 – 2016-04-01 (×6): 100 mg via ORAL
  Filled 2016-03-30 (×5): qty 4

## 2016-03-30 MED ORDER — LORAZEPAM 0.5 MG PO TABS
ORAL_TABLET | ORAL | Status: AC
  Administered 2016-03-30: 0.5 mg via ORAL
  Filled 2016-03-30: qty 1

## 2016-03-30 MED ORDER — LORAZEPAM 0.5 MG PO TABS
0.5000 mg | ORAL_TABLET | Freq: Three times a day (TID) | ORAL | Status: DC
  Administered 2016-03-30 – 2016-04-01 (×8): 0.5 mg via ORAL
  Filled 2016-03-30 (×6): qty 1

## 2016-03-30 MED ORDER — METOPROLOL SUCCINATE ER 50 MG PO TB24
25.0000 mg | ORAL_TABLET | Freq: Every day | ORAL | Status: DC
  Administered 2016-03-30 – 2016-04-01 (×2): 25 mg via ORAL
  Filled 2016-03-30 (×2): qty 1
  Filled 2016-03-30: qty 2

## 2016-03-30 MED ORDER — FUROSEMIDE 40 MG PO TABS
20.0000 mg | ORAL_TABLET | Freq: Every day | ORAL | Status: AC
  Administered 2016-03-30 – 2016-03-31 (×2): 20 mg via ORAL
  Filled 2016-03-30 (×2): qty 1

## 2016-03-30 MED ORDER — GABAPENTIN 100 MG PO CAPS
100.0000 mg | ORAL_CAPSULE | Freq: Three times a day (TID) | ORAL | Status: DC
  Administered 2016-03-30 – 2016-04-01 (×8): 100 mg via ORAL
  Filled 2016-03-30 (×9): qty 1

## 2016-03-30 MED ORDER — LORAZEPAM 0.5 MG PO TABS: 0.5000 mg | ORAL_TABLET | Freq: Three times a day (TID) | ORAL | Status: DC

## 2016-03-30 MED ORDER — QUETIAPINE FUMARATE 25 MG PO TABS
100.0000 mg | ORAL_TABLET | Freq: Four times a day (QID) | ORAL | Status: DC | PRN
  Filled 2016-03-30: qty 4

## 2016-03-30 MED ORDER — LINAGLIPTIN 5 MG PO TABS
5.0000 mg | ORAL_TABLET | Freq: Every day | ORAL | Status: DC
  Administered 2016-03-30 – 2016-04-01 (×3): 5 mg via ORAL
  Filled 2016-03-30 (×3): qty 1

## 2016-03-30 MED ORDER — LURASIDONE HCL 40 MG PO TABS
40.0000 mg | ORAL_TABLET | Freq: Every day | ORAL | Status: DC
  Administered 2016-03-31 – 2016-04-01 (×2): 40 mg via ORAL
  Filled 2016-03-30 (×2): qty 1

## 2016-03-30 NOTE — ED Notes (Signed)
blood sugar was 107 notified  Radonna Ricker

## 2016-03-30 NOTE — Discharge Instructions (Addendum)
You have been seen in the Emergency Department (ED)  today for a psychiatric complaint.  You have been evaluated by psychiatry and we believe you are safe to be discharged from the hospital.   ° °Please return to the Emergency Department (ED)  immediately if you have ANY thoughts of hurting yourself or anyone else, so that we may help you. ° °Please avoid alcohol and drug use. ° °Follow up with your doctor and/or therapist as soon as possible regarding today's ED  visit.  ° °You may call crisis hotline for [place] at [phone]. ° °

## 2016-03-30 NOTE — Consult Note (Signed)
[facility] Consult   Reason for Consult:  Consult for 60 year old man with a history of autistic disorder and schizoaffective disorder who presents to the hospital after going to the police and claiming to be suicidal. Referring Physician:  Edd Fabian Patient Identification: Alexander Beard MRN:  [id number] Principal Diagnosis: Active autistic disorder Diagnosis:   Patient Active Problem List   Diagnosis Date Noted  . Diabetes ([place]) [E11.9] 04/10/2015  . Hypertension [I10] 04/10/2015  . Prostate hypertrophy [N40.0] 04/10/2015  . Schizoaffective disorder ([place]) [F25.9] 04/10/2015  . Enlarged prostate [N40.0] 04/10/2015  . Essential (primary) hypertension [I10] 04/10/2015  . Type 2 diabetes mellitus ([place]) [E11.9] 04/10/2015  . Controlled type 2 diabetes mellitus without complication ([place]) [Y30.1] 04/10/2015  . Schizoaffective disorder, bipolar type ([place]) [F25.0] 12/29/2014  . Active autistic disorder [F84.0] 12/29/2014    Total Time spent with patient: 1 hour  Subjective:   Alexander Beard is a 60 y.o. male patient admitted with "they are just not listening to me and not treating me right".  HPI:  Patient interviewed. Chart reviewed. Patient is very well known to me since I see him as an outpatient and have been following him for years. This 60 year old man has a long-standing history of affect of symptoms and behavior problems and cognitive impairment. He currently resides at a group home. Evidently he got agitated yesterday as he occasionally does and walked out of the group home all the way to a police station where he made comments about being suicidal. View today the patient does not directly discuss anything about being suicidal but instead wants to have the same conversation we have had many times before about his belief that he should be allowed to do more recreational activities and be given more independence. He denies there is been any change in his medication. Denies  noncompliance with his medicine. Doesn't abuse alcohol or drugs. He's not able to identify any single 1 stress that might of really set him off yesterday. He admits that he does not want to die he simply wants to be given more recreational activities. Also has no thought or intention to hurt anybody else.  Social history: [name] is his guardian. [name] is actually very involved in Coulson's life and visits with him pretty much every week and takes him out on activities. Kelden is currently living in a group home and has been in this one longer than any other group home I've known of any in general seems to be doing quite well still prone to these kind of outbursts.  Medical history: Diabetes requiring insulin and oral medicine, hypertension  Substance abuse history: No history of substance abuse  Past Psychiatric History: Patient had been diagnosed with seasonal affective disorder. I've been working with him for a few years and in that time I have never seen him actually appear to be psychotic. He is clearly limited cognitively but not in the same way you would normally expect from most people with intellectual disability. Rather, he is very emotionally immature and has an extremely difficult time tolerating any change in his routine kind of emotional distress. I firmly believe that his primary problem is an autistic spectrum condition although he is on number of medicines for mood stability that do seem to be helpful for him. I have not known him to actually try to hurt himself although when he is frustrated he has occasionally impulsively run across streets without looking. I have never known him to actually try  and hurt anyone else.  Risk to Self: Suicidal Ideation: No Suicidal Intent: No Is patient at risk for suicide?: No Suicidal Plan?: No Access to Means: No What has been your use of drugs/alcohol within the last 12 months?: None Reported How many times?: 0 Other Self Harm Risks:  None Reported Triggers for Past Attempts: None known Intentional Self Injurious Behavior: None Risk to Others: Homicidal Ideation: No (Pt reports having intrusive thoughts) Thoughts of Harm to Others: No Current Homicidal Intent: No Current Homicidal Plan: No Access to Homicidal Means: No Identified Victim: N/A History of harm to others?: No Assessment of Violence: None Noted Violent Behavior Description: N/A Does patient have access to weapons?: No Criminal Charges Pending?: No Does patient have a court date: No Prior Inpatient Therapy: Prior Inpatient Therapy: Yes Prior Therapy Dates: [facility] - Unable to recall Prior Therapy Facilty/Provider(s): [facility] Reason for Treatment: Depression Prior Outpatient Therapy: Prior Outpatient Therapy: No Prior Therapy Dates: N/A Prior Therapy Facilty/Provider(s): N/A Reason for Treatment: N/A Does patient have an ACCT team?: No Does patient have Intensive In-House Services?  : No Does patient have [facility] services? : No Does patient have P4CC services?: Unknown  Past Medical History:  Past Medical History:  Diagnosis Date  . Anxiety   . Anxiety disorder due to known physiological condition   . Arthritis   . Autistic disorder, residual state   . Developmental disorder   . Diabetes mellitus without complication ([place])   . Dyslipidemia   . Esophageal reflux   . Hypertension   . Overactive bladder   . Schizophrenia, schizoaffective ([place])   . Sleep apnea   . Urinary incontinence     Past Surgical History:  Procedure Laterality Date  . TONSILLECTOMY     Family History:  Family History  Problem Relation Age of Onset  . Family history unknown: Yes   Family Psychiatric  History: None identified. Parents are deceased. [name] is his closest relative and his guardian. Social History:  History  Alcohol Use No     History  Drug Use No    Social History   Social History  . Marital status: Single    Spouse name: N/A  .  Number of children: N/A  . Years of education: N/A   Social History Main Topics  . Smoking status: Never Smoker  . Smokeless tobacco: Never Used  . Alcohol use No  . Drug use: No  . Sexual activity: No   Other Topics Concern  . None   Social History Narrative   The patient never finished high school but did get his GED. 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 He is currently in disability and [name] is his legal guardian. He has been living in group homes for many years.      No pending legal charges   Additional Social History:    Allergies:   Allergies  Allergen Reactions  . Cortizone-10  [Hydrocortisone] Other (See Comments)  . Other Other (See Comments)    MYACINS  . Penicillins Nausea And Vomiting    Labs:  Results for orders placed or performed during the hospital encounter of 03/29/16 (from the past 48 hour(s))  Comprehensive metabolic panel     Status: Abnormal   Collection Time: 03/29/16  6:06 PM  Result Value Ref Range   Sodium 135 135 - 145 mmol/L   Potassium 4.6 3.5 - 5.1 mmol/L  Chloride 99 (L) 101 - 111 mmol/L   CO2 31 22 - 32 mmol/L   Glucose, Bld 84 65 - 99 mg/dL   BUN 27 (H) 6 - 20 mg/dL   Creatinine, Ser 1.41 (H) 0.61 - 1.24 mg/dL   Calcium 8.9 8.9 - 10.3 mg/dL   Total Protein 7.0 6.5 - 8.1 g/dL   Albumin 3.5 3.5 - 5.0 g/dL   AST 22 15 - 41 U/L   ALT 8 (L) 17 - 63 U/L   Alkaline Phosphatase 52 38 - 126 U/L   Total Bilirubin 0.6 0.3 - 1.2 mg/dL   GFR calc non Af Amer 53 (L) >60 mL/min   GFR calc Af Amer >60 >60 mL/min    Comment: (NOTE) The eGFR has been calculated using the CKD EPI equation. This calculation has not been validated in all clinical situations. eGFR's persistently <60 mL/min signify possible Chronic Kidney Disease.    Anion gap 5 5 - 15  Ethanol     Status: None   Collection Time: 03/29/16  6:06 PM  Result Value Ref Range   Alcohol, Ethyl (B) <5 <5  mg/dL    Comment:        LOWEST DETECTABLE LIMIT FOR SERUM ALCOHOL IS 5 mg/dL FOR MEDICAL PURPOSES ONLY   Salicylate level     Status: None   Collection Time: 03/29/16  6:06 PM  Result Value Ref Range   Salicylate Lvl <4.7 2.8 - 30.0 mg/dL  Acetaminophen level     Status: Abnormal   Collection Time: 03/29/16  6:06 PM  Result Value Ref Range   Acetaminophen (Tylenol), Serum <10 (L) 10 - 30 ug/mL    Comment:        THERAPEUTIC CONCENTRATIONS VARY SIGNIFICANTLY. A RANGE OF 10-30 ug/mL MAY BE AN EFFECTIVE CONCENTRATION FOR MANY PATIENTS. HOWEVER, SOME ARE BEST TREATED AT CONCENTRATIONS OUTSIDE THIS RANGE. ACETAMINOPHEN CONCENTRATIONS >150 ug/mL AT 4 HOURS AFTER INGESTION AND >50 ug/mL AT 12 HOURS AFTER INGESTION ARE OFTEN ASSOCIATED WITH TOXIC REACTIONS.   cbc     Status: Abnormal   Collection Time: 03/29/16  6:06 PM  Result Value Ref Range   WBC 7.6 3.8 - 10.6 K/uL   RBC 3.78 (L) 4.40 - 5.90 MIL/uL   Hemoglobin 12.6 (L) 13.0 - 18.0 g/dL   HCT 36.1 (L) 40.0 - 52.0 %   MCV 95.4 80.0 - 100.0 fL   MCH 33.3 26.0 - 34.0 pg   MCHC 34.9 32.0 - 36.0 g/dL   RDW 12.6 11.5 - 14.5 %   Platelets 195 150 - 440 K/uL  Urine Drug Screen, Qualitative     Status: Abnormal   Collection Time: 03/29/16  6:11 PM  Result Value Ref Range   Tricyclic, Ur Screen POSITIVE (A) NONE DETECTED   Amphetamines, Ur Screen NONE DETECTED NONE DETECTED   MDMA (Ecstasy)Ur Screen NONE DETECTED NONE DETECTED   Cocaine Metabolite,Ur [place] NONE DETECTED NONE DETECTED   Opiate, Ur Screen NONE DETECTED NONE DETECTED   Phencyclidine (PCP) Ur S NONE DETECTED NONE DETECTED   Cannabinoid 50 Ng, Ur [place] NONE DETECTED NONE DETECTED   Barbiturates, Ur Screen NONE DETECTED NONE DETECTED   Benzodiazepine, Ur Scrn POSITIVE (A) NONE DETECTED   Methadone Scn, Ur NONE DETECTED NONE DETECTED    Comment: (NOTE) 076  Tricyclics, urine               Cutoff 1000 ng/mL 200  Amphetamines, urine  Cutoff 1000 ng/mL 300   MDMA (Ecstasy), urine           Cutoff 500 ng/mL 400  Cocaine Metabolite, urine       Cutoff 300 ng/mL 500  Opiate, urine                   Cutoff 300 ng/mL 600  Phencyclidine (PCP), urine      Cutoff 25 ng/mL 700  Cannabinoid, urine              Cutoff 50 ng/mL 800  Barbiturates, urine             Cutoff 200 ng/mL 900  Benzodiazepine, urine           Cutoff 200 ng/mL 1000 Methadone, urine                Cutoff 300 ng/mL 1100 1200 The urine drug screen provides only a preliminary, unconfirmed 1300 analytical test result and should not be used for non-medical 1400 purposes. Clinical consideration and professional judgment should 1500 be applied to any positive drug screen result due to possible 1600 interfering substances. A more specific alternate chemical method 1700 must be used in order to obtain a confirmed analytical result.  1800 Gas chromato graphy / mass spectrometry (GC/MS) is the preferred 1900 confirmatory method.   Glucose, capillary     Status: Abnormal   Collection Time: 03/30/16 12:32 PM  Result Value Ref Range   Glucose-Capillary 107 (H) 65 - 99 mg/dL   Comment 1 Notify RN     Current Facility-Administered Medications  Medication Dose Route Frequency Provider Last Rate Last Dose  . divalproex (DEPAKOTE ER) 24 hr tablet 2,000 mg  2,000 mg Oral QHS Gonzella [place], MD      . furosemide (LASIX) tablet 20 mg  20 mg Oral Daily Delman Kitten, MD   20 mg at 03/30/16 0927  . gabapentin (NEURONTIN) capsule 100 mg  100 mg Oral TID Delman Kitten, MD   100 mg at 03/30/16 1117  . insulin glargine (LANTUS) injection 12 Units  12 Units Subcutaneous QHS Gonzella [place], MD      . lamoTRIgine (LAMICTAL) tablet 100 mg  100 mg Oral QHS Delman Kitten, MD      . linagliptin (TRADJENTA) tablet 5 mg  5 mg Oral Daily Gonzella [place], MD      . lisinopril (PRINIVIL,ZESTRIL) tablet 2.5 mg  2.5 mg Oral Daily Delman Kitten, MD   2.5 mg at 03/30/16 0926  . LORazepam (ATIVAN) tablet 0.5 mg  0.5 mg Oral TID  Delman Kitten, MD   0.5 mg at 03/30/16 0918  . LORazepam (ATIVAN) tablet 0.5 mg  0.5 mg Oral TID Gonzella [place], MD      . Derrill Memo ON 03/31/2016] lurasidone (LATUDA) tablet 40 mg  40 mg Oral Q breakfast Delman Kitten, MD      . metoprolol succinate (TOPROL-XL) 24 hr tablet 25 mg  25 mg Oral Daily Delman Kitten, MD   25 mg at 03/30/16 5093  . QUEtiapine (SEROQUEL) tablet 100 mg  100 mg Oral Q6H PRN Gonzella [place], MD      . QUEtiapine (SEROQUEL) tablet 100 mg  100 mg Oral TID Gonzella [place], MD      . QUEtiapine (SEROQUEL) tablet 400 mg  400 mg Oral QHS Gonzella [place], MD      . sertraline (ZOLOFT) tablet 200 mg  200 mg Oral Daily Gonzella [place], MD  Current Outpatient Prescriptions  Medication Sig Dispense Refill  . Cholecalciferol (VITAMIN D-1000 MAX ST) 1000 UNITS tablet Take 5,000 Units by mouth. Reported on 08/03/2015    . divalproex (DEPAKOTE ER) 500 MG 24 hr tablet Take 4 tablets (2,000 mg total) by mouth at bedtime. 120 tablet 5  . furosemide (LASIX) 20 MG tablet Take by mouth.    . gabapentin (NEURONTIN) 100 MG capsule Take 1 capsule (100 mg total) by mouth 3 (three) times daily. 90 capsule 5  . insulin glargine (LANTUS) 100 UNIT/ML injection Inject 0.12 mLs (12 Units total) into the skin at bedtime. 30 mL 0  . lamoTRIgine (LAMICTAL) 100 MG tablet Take 1 tablet (100 mg total) by mouth at bedtime. 30 tablet 5  . lisinopril (PRINIVIL,ZESTRIL) 5 MG tablet Take by mouth.    . loratadine (CLARITIN) 10 MG tablet Take 1 tablet (10 mg total) by mouth daily. 30 tablet 5  . LORazepam (ATIVAN) 0.5 MG tablet Take 1 tablet (0.5 mg total) by mouth 3 (three) times daily. 90 tablet 5  . lurasidone (LATUDA) 40 MG TABS tablet Take 1 tablet (40 mg total) by mouth daily with breakfast. 30 tablet 5  . metoprolol succinate (TOPROL-XL) 50 MG 24 hr tablet Take by mouth.    . naproxen (NAPROSYN) 500 MG tablet Take 1 tablet (500 mg total) by mouth 2 (two) times daily as needed for moderate pain (not to exceed 2 doses  in 24 hours). 60 tablet 5  . QUEtiapine (SEROQUEL) 100 MG tablet Take 1 tablet (100 mg total) by mouth every 6 (six) hours as needed (agitation). 60 tablet 3  . QUEtiapine (SEROQUEL) 100 MG tablet Take 1 tablet (100 mg total) by mouth 3 (three) times daily. 90 tablet 5  . QUEtiapine (SEROQUEL) 400 MG tablet Take 1 tablet (400 mg total) by mouth at bedtime. 30 tablet 3  . sertraline (ZOLOFT) 100 MG tablet Take two tablets (280m) by mouth once daily 60 tablet 3  . simvastatin (ZOCOR) 20 MG tablet Take 20 mg by mouth daily.    . sitaGLIPtin (JANUVIA) 100 MG tablet Take 100 mg by mouth daily.    . solifenacin (VESICARE) 5 MG tablet Take 5 mg by mouth daily.      Musculoskeletal: Strength & Muscle Tone: within normal limits Gait & Station: normal Patient leans: N/A  Psychiatric Specialty Exam: Physical Exam  Nursing note and vitals reviewed. Constitutional: He appears well-developed and well-nourished.  HENT:  Head: Normocephalic and atraumatic.  Eyes: Conjunctivae are normal. Pupils are equal, round, and reactive to light.  Neck: Normal range of motion.  Cardiovascular: Regular rhythm and normal heart sounds.   Respiratory: Effort normal. No respiratory distress.  GI: Soft.  Musculoskeletal: Normal range of motion.  Neurological: He is alert.  Skin: Skin is warm and dry.  Psychiatric: His affect is blunt, labile and inappropriate. His speech is tangential. He is agitated. He is not actively hallucinating. Thought content is not paranoid and not delusional. He expresses impulsivity. He expresses no homicidal and no suicidal ideation. He exhibits abnormal recent memory.    Review of Systems  Constitutional: Negative.   HENT: Negative.   Eyes: Negative.   Respiratory: Negative.   Cardiovascular: Negative.   Gastrointestinal: Negative.   Musculoskeletal: Negative.   Skin: Negative.   Neurological: Negative.   Psychiatric/Behavioral: Positive for depression and suicidal ideas.  Negative for hallucinations, memory loss and substance abuse. The patient is nervous/anxious. The patient does not have insomnia.  Blood pressure (!) 157/134, pulse 98, temperature 98.3 F (36.8 C), temperature source Oral, resp. rate (!) 22, height 5' 8"  (1.727 m), weight 104.3 kg (230 lb), SpO2 94 %.Body mass index is 34.97 kg/m.  General Appearance: Disheveled  Eye Contact:  Good  Speech:  Pressured  Volume:  Increased  Mood:  Irritable  Affect:  Labile  Thought Process:  Disorganized  Orientation:  Full (Time, Place, and Person)  Thought Content:  Tangential  Suicidal Thoughts:  No  Homicidal Thoughts:  No  Memory:  Immediate;   Good Recent;   Fair Remote;   Fair  Judgement:  Impaired  Insight:  Shallow  Psychomotor Activity:  Normal  Concentration:  Concentration: Poor  Recall:  [employer] of Knowledge:  Fair  Language:  Fair  Akathisia:  No  Handed:  Right  AIMS (if indicated):     Assets:  Desire for Improvement Financial Resources/Insurance Housing Intimacy Resilience Social Support  ADL's:  Impaired  Cognition:  Impaired,  Mild  Sleep:        Treatment Plan Summary: Daily contact with patient to assess and evaluate symptoms and progress in treatment, Medication management and Plan 60 year old man with autistic spectrum disorder schizoaffective disorder. Currently decompensated. I have seen him multiple times like this in the past. Sometimes related to his specific stress but sometimes for no clear reason. In my experience hospitalization is a very limited benefit. He tends to be very demanding and at times pretty difficult to manage on the inpatient unit although not constantly. I don't think that he is acutely dangerous to himself or others. Right now he is certainly worked up and animated but after talking for a while he did calm down a little. He had not been given all of his normal psychiatric medicine since coming into the ER. I am restarting everything as  normally prescribed. Discontinue IVC as I do not think he meets criteria for it. I am optimistic that he will calm down a bit and we can work on getting him discharged from the emergency room before too long.  Disposition: Patient does not meet criteria for psychiatric inpatient admission. Supportive therapy provided about ongoing stressors. Discussed crisis plan, support from social network, calling 911, coming to the Emergency Department, and calling Suicide Hotline.  Alethia Berthold, MD 03/30/2016 2:27 PM

## 2016-03-30 NOTE — ED Notes (Signed)
Pt. Brother is here to pick up patient.  Pt. Changing into his clothes.

## 2016-03-30 NOTE — ED Notes (Signed)
Patient in shower 

## 2016-03-30 NOTE — ED Notes (Signed)
Report to [facility] at group home. TTS, Lambert Mody states that brother is coming to get patient from hospital upon discharge. Attempt to call brother, no answer. EDP aware and will prepare discharge papers.

## 2016-03-30 NOTE — ED Provider Notes (Signed)
-----------------------------------------   7:59 PM on 03/30/2016 -----------------------------------------   Blood pressure (!) 159/112, pulse 73, temperature 98.1 F (36.7 C), temperature source Oral, resp. rate (!) 24, height 5\' 8"  (1.727 m), weight 230 lb (104.3 kg), SpO2 96 %.  Patient's brother arrived to pick him up and bring him back to the group home. Patient became severely agitated and aggressive. Threatening to kill himself if he goes back to his facility. Threatened to leave and run into traffic. Patient agitated and screaming at his brother and staff. Had to be escorted back into his home by security. I tried to reason with patient and discuss possible options however patient kept screaming and hitting himself, demanding to stay here. Patient had to be sedated with IM haldol. Patient will remain in the ED for now untul further reassessment in the morning by psychiatry.   Rudene Re, MD 03/30/16 2002

## 2016-03-30 NOTE — ED Notes (Signed)
Pt given meal tray.

## 2016-03-30 NOTE — ED Notes (Signed)
Pt currently asleep. Nothing needed of staff at this time, environment safe

## 2016-03-30 NOTE — Progress Notes (Signed)
Pt brother Nachum Behr called stated he can pick up pt. today after 6:30 p.m.  Cannon Quinton K. Nash Shearer, LPC-A, [facility]  Counselor 03/30/2016 5:34 PM

## 2016-03-30 NOTE — ED Notes (Signed)
Pt walked into hallway very tearful and yelling " I need my special pill" Orders obtained from MD for home medications, Ativan given; pt resting in bed

## 2016-03-30 NOTE — ED Notes (Signed)
Pt wet self. Pt given new change of clothes and HSK notified about bathroom floor.

## 2016-03-30 NOTE — ED Notes (Signed)
Pt sheet changed and warm blanket given. Pt stated due to shaking he spilled his water.

## 2016-03-30 NOTE — ED Notes (Signed)
Pt seen standing to use the urinal. Pt's urinal was emptied/  Contents contained 736mL. Pt states nothing else is needed at this time.

## 2016-03-30 NOTE — ED Notes (Signed)
Pt ambulated to bathroom 

## 2016-03-30 NOTE — ED Notes (Signed)
Report from Jill, RN  

## 2016-03-30 NOTE — ED Notes (Signed)
Pt. Brother arrived and pt. Was changed into his clothes.  Pt. Came out of room and was tearful.  Pt. Became more upset as discharge proceeded.  Pt. Became load in hallway and was escorted back to room.  Pt. Brother, this nurse and ED doctor did not think it was beneficial for pt. To go back to group home tonight.  [name] to talk to pt. In morning for reevaluation.

## 2016-03-30 NOTE — ED Notes (Signed)
Pt given breakfast tray. Pt sitting up, eating.

## 2016-03-30 NOTE — ED Notes (Signed)
Pt up to bathroom for shower.

## 2016-03-30 NOTE — ED Notes (Signed)
Dr, Weber Cooks recscinded IVC papers  9/20

## 2016-03-30 NOTE — ED Notes (Signed)
Spoke with EDP, Veronese regarding patient's disposition.  

## 2016-03-30 NOTE — ED Notes (Signed)
Pt up to bathroom, back in room at this time. Pt discharge papers printed. Awaiting patient's brother to arrive and then will proceed with discharge.

## 2016-03-30 NOTE — ED Provider Notes (Signed)
-----------------------------------------   5:21 PM on 03/30/2016 -----------------------------------------   Blood pressure (!) 160/88, pulse 82, temperature 98.1 F (36.7 C), temperature source Oral, resp. rate 20, height 5\' 8"  (1.727 m), weight 230 lb (104.3 kg), SpO2 96 %.  Patient cleared by Dr. Norville Haggard for discharge. Brother will come to pick him up. Patient stable. Labs WNL.   Rudene Re, MD 03/30/16 [phone]

## 2016-03-30 NOTE — ED Notes (Signed)
Pt. Tearful upon discharge.  Pt. Being discharged with brother to go back to group home.

## 2016-03-31 DIAGNOSIS — F840 Autistic disorder: Secondary | ICD-10-CM | POA: Diagnosis not present

## 2016-03-31 DIAGNOSIS — R441 Visual hallucinations: Secondary | ICD-10-CM | POA: Diagnosis not present

## 2016-03-31 LAB — GLUCOSE, CAPILLARY
GLUCOSE-CAPILLARY: 93 mg/dL (ref 65–99)
GLUCOSE-CAPILLARY: 121 mg/dL — AB (ref 65–99)

## 2016-03-31 MED ORDER — SERTRALINE HCL 100 MG PO TABS
ORAL_TABLET | ORAL | Status: AC
  Filled 2016-03-31: qty 1

## 2016-03-31 MED ORDER — QUETIAPINE FUMARATE 25 MG PO TABS
ORAL_TABLET | ORAL | Status: AC
  Filled 2016-03-31: qty 1

## 2016-03-31 MED ORDER — LORAZEPAM 0.5 MG PO TABS
ORAL_TABLET | ORAL | Status: AC
  Filled 2016-03-31: qty 1

## 2016-03-31 NOTE — ED Provider Notes (Signed)
-----------------------------------------   4:42 AM on 03/31/2016 -----------------------------------------   BP (!) 159/112 (BP Location: Left Arm)   Pulse 73   Temp 98.1 F (36.7 C) (Oral)   Resp (!) 24   Ht 5\' 8"  (1.727 m)   Wt 230 lb (104.3 kg)   SpO2 96%   BMI 34.97 kg/m   No acute events overnight. Will be reevaluated by psychiatry.       Nance Pear, MD 03/31/16 [phone]

## 2016-03-31 NOTE — ED Notes (Signed)
Pt resting quietly when this nurse arrived to give medication to roommate. Pt began to say he didn't feel good and that he had "so much in his head worrying me." Pt repositioned and encouraged to practice deep breathing until this nurse could finish her task. At the time of task completion, pt resting comfortably with unlabored breathing and eyes closed.

## 2016-03-31 NOTE — ED Notes (Signed)
Pt given sprite per his request; after he was given it, he stated that he doesn't want ice. Told pt I will remember for future.

## 2016-03-31 NOTE — ED Notes (Signed)
Pt to the door repeatedly to tell this RN he wants a new psychiatrist. This RN instructed patient that he will need to speak to Psychiatrist regarding getting a new doctor. Pt also continually repeats "I have tremors due to my diabetes", this RN explained that having diabetes was not necessarily the cause of his tremors. Pt then states "well I need to figure out whats causing my tremors". Pt then became upset with this RN due to this RN stating that I could not refer or recommend a new psychiatrist and that he would have to wait until the psychiatrist that was seeing patient today arrived to speak with him.

## 2016-03-31 NOTE — ED Notes (Signed)
Pt's guardian/brother called for update. He agreed that he will not visit tonight but will call tomorrow to make sure pt is ok, pending inpatient admission. Asked that we call him if we need to.

## 2016-03-31 NOTE — Consult Note (Signed)
[facility] Face-to-Face Psychiatry Consult   Reason for Consult:  Consult for 60-year-old man with a history of autistic disorder and schizoaffective disorder who presents to the hospital after going to the police and claiming to be suicidal. Referring Physician:  Gayle Patient Identification: Alexander Beard MRN:  [id number] Principal Diagnosis: Active autistic disorder Diagnosis:   Patient Active Problem List   Diagnosis Date Noted  . Diabetes (HCC) [E11.9] 04/10/2015  . Hypertension [I10] 04/10/2015  . Prostate hypertrophy [N40.0] 04/10/2015  . Schizoaffective disorder (HCC) [F25.9] 04/10/2015  . Enlarged prostate [N40.0] 04/10/2015  . Essential (primary) hypertension [I10] 04/10/2015  . Type 2 diabetes mellitus (HCC) [E11.9] 04/10/2015  . Controlled type 2 diabetes mellitus without complication (HCC) [E11.9] 04/10/2015  . Schizoaffective disorder, bipolar type (HCC) [F25.0] 12/29/2014  . Active autistic disorder [F84.0] 12/29/2014    Total Time spent with patient: 20 minutes  Subjective:   Alexander Beard is a 60 y.o. male patient admitted with "they are just not listening to me and not treating me right".  Follow-up for Thursday the 21st. Yesterday I had seen this patient and suggested that he be discharged back to his living facility. His brother, who is his guardian, came to pick him up and instead of being cooperative the patient threw a tantrum. He refused to leave the hospital and started making suicidal statements again. On evaluation this morning he is sleepy. Says he is still feeling bad. Still says he does not want to go back to his group home.  HPI:  Patient interviewed. Chart reviewed. Patient is very well known to me since I see him as an outpatient and have been following him for years. This 60-year-old man has a long-standing history of affect of symptoms and behavior problems and cognitive impairment. He currently resides at a group home. Evidently he got agitated yesterday as  he occasionally does and walked out of the group home all the way to a police station where he made comments about being suicidal. View today the patient does not directly discuss anything about being suicidal but instead wants to have the same conversation we have had many times before about his belief that he should be allowed to do more recreational activities and be given more independence. He denies there is been any change in his medication. Denies noncompliance with his medicine. Doesn't abuse alcohol or drugs. He's not able to identify any single 1 stress that might of really set him off yesterday. He admits that he does not want to die he simply wants to be given more recreational activities. Also has no thought or intention to hurt anybody else.  Social history: His brother Steven is his guardian. [name] is actually very involved in Alexander Beard's life and visits with him pretty much every week and takes him out on activities. Vishruth is currently living in a group home and has been in this one longer than any other group home I've known of any in general seems to be doing quite well still prone to these kind of outbursts.  Medical history: Diabetes requiring insulin and oral medicine, hypertension  Substance abuse history: No history of substance abuse  Past Psychiatric History: Patient had been diagnosed with seasonal affective disorder. I've been working with him for a few years and in that time I have never seen him actually appear to be psychotic. He is clearly limited cognitively but not in the same way you would normally expect from most people with intellectual disability. Rather, he is   very emotionally immature and has an extremely difficult time tolerating any change in his routine kind of emotional distress. I firmly believe that his primary problem is an autistic spectrum condition although he is on number of medicines for mood stability that do seem to be helpful for him. I have not known him  to actually try to hurt himself although when he is frustrated he has occasionally impulsively run across streets without looking. I have never known him to actually try and hurt anyone else.  Risk to Self: Suicidal Ideation: No Suicidal Intent: No Is patient at risk for suicide?: No Suicidal Plan?: No Access to Means: No What has been your use of drugs/alcohol within the last 12 months?: None Reported How many times?: 0 Other Self Harm Risks: None Reported Triggers for Past Attempts: None known Intentional Self Injurious Behavior: None Risk to Others: Homicidal Ideation: No (Pt reports having intrusive thoughts) Thoughts of Harm to Others: No Current Homicidal Intent: No Current Homicidal Plan: No Access to Homicidal Means: No Identified Victim: N/A History of harm to others?: No Assessment of Violence: None Noted Violent Behavior Description: N/A Does patient have access to weapons?: No Criminal Charges Pending?: No Does patient have a court date: No Prior Inpatient Therapy: Prior Inpatient Therapy: Yes Prior Therapy Dates: [facility] - Unable to recall Prior Therapy Facilty/Provider(s): [facility] Reason for Treatment: Depression Prior Outpatient Therapy: Prior Outpatient Therapy: No Prior Therapy Dates: N/A Prior Therapy Facilty/Provider(s): N/A Reason for Treatment: N/A Does patient have an ACCT team?: No Does patient have Intensive In-House Services?  : No Does patient have [facility] services? : No Does patient have P4[facility] services?: Unknown  Past Medical History:  Past Medical History:  Diagnosis Date  . Anxiety   . Anxiety disorder due to known physiological condition   . Arthritis   . Autistic disorder, residual state   . Developmental disorder   . Diabetes mellitus without complication ([place])   . Dyslipidemia   . Esophageal reflux   . Hypertension   . Overactive bladder   . Schizophrenia, schizoaffective ([place])   . Sleep apnea   . Urinary incontinence     Past Surgical  History:  Procedure Laterality Date  . TONSILLECTOMY     Family History:  Family History  Problem Relation Age of Onset  . Family history unknown: Yes   Family Psychiatric  History: None identified. Parents are deceased. [name] is his closest relative and his guardian. Social History:  History  Alcohol Use No     History  Drug Use No    Social History   Social History  . Marital status: Single    Spouse name: N/A  . Number of children: N/A  . Years of education: N/A   Social History Main Topics  . Smoking status: Never Smoker  . Smokeless tobacco: Never Used  . Alcohol use No  . Drug use: No  . Sexual activity: No   Other Topics Concern  . None   Social History Narrative   The patient never finished high school but did get his GED. 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 He is currently in disability and [name] is his legal guardian. He has been living in group homes for many years.      No pending legal charges   Additional Social History:    Allergies:   Allergies  Allergen Reactions  . Cortizone-10  [  Hydrocortisone] Other (See Comments)  . Other Other (See Comments)    MYACINS  . Penicillins Nausea And Vomiting    Labs:  Results for orders placed or performed during the hospital encounter of 03/29/16 (from the past 48 hour(s))  Comprehensive metabolic panel     Status: Abnormal   Collection Time: 03/29/16  6:06 PM  Result Value Ref Range   Sodium 135 135 - 145 mmol/L   Potassium 4.6 3.5 - 5.1 mmol/L   Chloride 99 (L) 101 - 111 mmol/L   CO2 31 22 - 32 mmol/L   Glucose, Bld 84 65 - 99 mg/dL   BUN 27 (H) 6 - 20 mg/dL   Creatinine, Ser 1.41 (H) 0.61 - 1.24 mg/dL   Calcium 8.9 8.9 - 10.3 mg/dL   Total Protein 7.0 6.5 - 8.1 g/dL   Albumin 3.5 3.5 - 5.0 g/dL   AST 22 15 - 41 U/L   ALT 8 (L) 17 - 63 U/L   Alkaline Phosphatase 52 38 - 126 U/L   Total Bilirubin 0.6 0.3 -  1.2 mg/dL   GFR calc non Af Amer 53 (L) >60 mL/min   GFR calc Af Amer >60 >60 mL/min    Comment: (NOTE) The eGFR has been calculated using the CKD EPI equation. This calculation has not been validated in all clinical situations. eGFR's persistently <60 mL/min signify possible Chronic Kidney Disease.    Anion gap 5 5 - 15  Ethanol     Status: None   Collection Time: 03/29/16  6:06 PM  Result Value Ref Range   Alcohol, Ethyl (B) <5 <5 mg/dL    Comment:        LOWEST DETECTABLE LIMIT FOR SERUM ALCOHOL IS 5 mg/dL FOR MEDICAL PURPOSES ONLY   Salicylate level     Status: None   Collection Time: 03/29/16  6:06 PM  Result Value Ref Range   Salicylate Lvl <4.0 2.8 - 30.0 mg/dL  Acetaminophen level     Status: Abnormal   Collection Time: 03/29/16  6:06 PM  Result Value Ref Range   Acetaminophen (Tylenol), Serum <10 (L) 10 - 30 ug/mL    Comment:        THERAPEUTIC CONCENTRATIONS VARY SIGNIFICANTLY. A RANGE OF 10-30 ug/mL MAY BE AN EFFECTIVE CONCENTRATION FOR MANY PATIENTS. HOWEVER, SOME ARE BEST TREATED AT CONCENTRATIONS OUTSIDE THIS RANGE. ACETAMINOPHEN CONCENTRATIONS >150 ug/mL AT 4 HOURS AFTER INGESTION AND >50 ug/mL AT 12 HOURS AFTER INGESTION ARE OFTEN ASSOCIATED WITH TOXIC REACTIONS.   cbc     Status: Abnormal   Collection Time: 03/29/16  6:06 PM  Result Value Ref Range   WBC 7.6 3.8 - 10.6 K/uL   RBC 3.78 (L) 4.40 - 5.90 MIL/uL   Hemoglobin 12.6 (L) 13.0 - 18.0 g/dL   HCT 36.1 (L) 40.0 - 52.0 %   MCV 95.4 80.0 - 100.0 fL   MCH 33.3 26.0 - 34.0 pg   MCHC 34.9 32.0 - 36.0 g/dL   RDW 12.6 11.5 - 14.5 %   Platelets 195 150 - 440 K/uL  Urine Drug Screen, Qualitative     Status: Abnormal   Collection Time: 03/29/16  6:11 PM  Result Value Ref Range   Tricyclic, Ur Screen POSITIVE (A) NONE DETECTED   Amphetamines, Ur Screen NONE DETECTED NONE DETECTED   MDMA (Ecstasy)Ur Screen NONE DETECTED NONE DETECTED   Cocaine Metabolite,Ur [place] NONE DETECTED NONE DETECTED   Opiate,  Ur Screen NONE DETECTED NONE DETECTED   Phencyclidine (PCP)   Ur S NONE DETECTED NONE DETECTED   Cannabinoid 50 Ng, Ur [place] NONE DETECTED NONE DETECTED   Barbiturates, Ur Screen NONE DETECTED NONE DETECTED   Benzodiazepine, Ur Scrn POSITIVE (A) NONE DETECTED   Methadone Scn, Ur NONE DETECTED NONE DETECTED    Comment: (NOTE) 100  Tricyclics, urine               Cutoff 1000 ng/mL 200  Amphetamines, urine             Cutoff 1000 ng/mL 300  MDMA (Ecstasy), urine           Cutoff 500 ng/mL 400  Cocaine Metabolite, urine       Cutoff 300 ng/mL 500  Opiate, urine                   Cutoff 300 ng/mL 600  Phencyclidine (PCP), urine      Cutoff 25 ng/mL 700  Cannabinoid, urine              Cutoff 50 ng/mL 800  Barbiturates, urine             Cutoff 200 ng/mL 900  Benzodiazepine, urine           Cutoff 200 ng/mL 1000 Methadone, urine                Cutoff 300 ng/mL 1100 1200 The urine drug screen provides only a preliminary, unconfirmed 1300 analytical test result and should not be used for non-medical 1400 purposes. Clinical consideration and professional judgment should 1500 be applied to any positive drug screen result due to possible 1600 interfering substances. A more specific alternate chemical method 1700 must be used in order to obtain a confirmed analytical result.  1800 Gas chromato graphy / mass spectrometry (GC/MS) is the preferred 1900 confirmatory method.   Glucose, capillary     Status: Abnormal   Collection Time: 03/30/16 12:32 PM  Result Value Ref Range   Glucose-Capillary 107 (H) 65 - 99 mg/dL   Comment 1 Notify RN   Glucose, capillary     Status: Abnormal   Collection Time: 03/30/16  5:32 PM  Result Value Ref Range   Glucose-Capillary 232 (H) 65 - 99 mg/dL  Glucose, capillary     Status: None   Collection Time: 03/31/16  8:06 AM  Result Value Ref Range   Glucose-Capillary 93 65 - 99 mg/dL  Glucose, capillary     Status: Abnormal   Collection Time: 03/31/16 12:10 PM   Result Value Ref Range   Glucose-Capillary 121 (H) 65 - 99 mg/dL    Current Facility-Administered Medications  Medication Dose Route Frequency Provider Last Rate Last Dose  . divalproex (DEPAKOTE ER) 24 hr tablet 2,000 mg  2,000 mg Oral QHS [name] [name], MD   2,000 mg at 03/30/16 2136  . gabapentin (NEURONTIN) capsule 100 mg  100 mg Oral TID Mark Quale, MD   100 mg at 03/31/16 1024  . insulin glargine (LANTUS) injection 12 Units  12 Units Subcutaneous QHS [name] [name], MD   12 Units at 03/30/16 2141  . lamoTRIgine (LAMICTAL) tablet 100 mg  100 mg Oral QHS Mark Quale, MD   100 mg at 03/30/16 2137  . linagliptin (TRADJENTA) tablet 5 mg  5 mg Oral Daily [name] [name], MD   5 mg at 03/31/16 1021  . lisinopril (PRINIVIL,ZESTRIL) tablet 2.5 mg  2.5 mg Oral Daily Mark Quale, MD   Stopped at 03/31/16 1023  .   LORazepam (ATIVAN) tablet 0.5 mg  0.5 mg Oral TID Mark Quale, MD   0.5 mg at 03/31/16 1020  . lurasidone (LATUDA) tablet 40 mg  40 mg Oral Q breakfast Mark Quale, MD   40 mg at 03/31/16 0831  . metoprolol succinate (TOPROL-XL) 24 hr tablet 25 mg  25 mg Oral Daily Mark Quale, MD   Stopped at 03/31/16 1024  . QUEtiapine (SEROQUEL) tablet 100 mg  100 mg Oral Q6H PRN [name] [name], MD      . QUEtiapine (SEROQUEL) tablet 100 mg  100 mg Oral TID [name] [name], MD   100 mg at 03/31/16 1019  . QUEtiapine (SEROQUEL) tablet 400 mg  400 mg Oral QHS [name] [name], MD   400 mg at 03/30/16 2138  . sertraline (ZOLOFT) tablet 200 mg  200 mg Oral Daily [name] [name], MD   200 mg at 03/31/16 1019   Current Outpatient Prescriptions  Medication Sig Dispense Refill  . Cholecalciferol (VITAMIN D-1000 MAX ST) 1000 UNITS tablet Take 5,000 Units by mouth. Reported on 08/03/2015    . divalproex (DEPAKOTE ER) 500 MG 24 hr tablet Take 4 tablets (2,000 mg total) by mouth at bedtime. 120 tablet 5  . furosemide (LASIX) 20 MG tablet Take by mouth.    . gabapentin (NEURONTIN) 100 MG capsule Take 1 capsule (100 mg  total) by mouth 3 (three) times daily. 90 capsule 5  . insulin glargine (LANTUS) 100 UNIT/ML injection Inject 0.12 mLs (12 Units total) into the skin at bedtime. 30 mL 0  . lamoTRIgine (LAMICTAL) 100 MG tablet Take 1 tablet (100 mg total) by mouth at bedtime. 30 tablet 5  . lisinopril (PRINIVIL,ZESTRIL) 5 MG tablet Take by mouth.    . loratadine (CLARITIN) 10 MG tablet Take 1 tablet (10 mg total) by mouth daily. 30 tablet 5  . LORazepam (ATIVAN) 0.5 MG tablet Take 1 tablet (0.5 mg total) by mouth 3 (three) times daily. 90 tablet 5  . lurasidone (LATUDA) 40 MG TABS tablet Take 1 tablet (40 mg total) by mouth daily with breakfast. 30 tablet 5  . metoprolol succinate (TOPROL-XL) 50 MG 24 hr tablet Take by mouth.    . naproxen (NAPROSYN) 500 MG tablet Take 1 tablet (500 mg total) by mouth 2 (two) times daily as needed for moderate pain (not to exceed 2 doses in 24 hours). 60 tablet 5  . QUEtiapine (SEROQUEL) 100 MG tablet Take 1 tablet (100 mg total) by mouth every 6 (six) hours as needed (agitation). 60 tablet 3  . QUEtiapine (SEROQUEL) 100 MG tablet Take 1 tablet (100 mg total) by mouth 3 (three) times daily. 90 tablet 5  . QUEtiapine (SEROQUEL) 400 MG tablet Take 1 tablet (400 mg total) by mouth at bedtime. 30 tablet 3  . sertraline (ZOLOFT) 100 MG tablet Take two tablets (200mg) by mouth once daily 60 tablet 3  . simvastatin (ZOCOR) 20 MG tablet Take 20 mg by mouth daily.    . sitaGLIPtin (JANUVIA) 100 MG tablet Take 100 mg by mouth daily.    . solifenacin (VESICARE) 5 MG tablet Take 5 mg by mouth daily.      Musculoskeletal: Strength & Muscle Tone: within normal limits Gait & Station: normal Patient leans: N/A  Psychiatric Specialty Exam: Physical Exam  Nursing note and vitals reviewed. Constitutional: He appears well-developed and well-nourished.  HENT:  Head: Normocephalic and atraumatic.  Eyes: Conjunctivae are normal. Pupils are equal, round, and reactive to light.  Neck:   Normal  range of motion.  Cardiovascular: Regular rhythm and normal heart sounds.   Respiratory: Effort normal. No respiratory distress.  GI: Soft.  Musculoskeletal: Normal range of motion.  Neurological: He is alert.  Skin: Skin is warm and dry.  Psychiatric: His affect is blunt, labile and inappropriate. His speech is tangential. He is agitated. He is not actively hallucinating. Thought content is not paranoid and not delusional. He expresses impulsivity. He expresses no homicidal and no suicidal ideation. He exhibits abnormal recent memory.    Review of Systems  Constitutional: Negative.   HENT: Negative.   Eyes: Negative.   Respiratory: Negative.   Cardiovascular: Negative.   Gastrointestinal: Negative.   Musculoskeletal: Negative.   Skin: Negative.   Neurological: Negative.   Psychiatric/Behavioral: Positive for depression and suicidal ideas. Negative for hallucinations, memory loss and substance abuse. The patient is nervous/anxious. The patient does not have insomnia.     Blood pressure 104/65, pulse (!) 55, temperature 98.1 F (36.7 C), temperature source Oral, resp. rate 16, height 5' 8" (1.727 m), weight 104.3 kg (230 lb), SpO2 94 %.Body mass index is 34.97 kg/m.  General Appearance: Disheveled  Eye Contact:  Good  Speech:  Pressured  Volume:  Increased  Mood:  Irritable  Affect:  Labile  Thought Process:  Disorganized  Orientation:  Full (Time, Place, and Person)  Thought Content:  Tangential  Suicidal Thoughts:  No  Homicidal Thoughts:  No  Memory:  Immediate;   Good Recent;   Fair Remote;   Fair  Judgement:  Impaired  Insight:  Shallow  Psychomotor Activity:  Normal  Concentration:  Concentration: Poor  Recall:  Fair  Fund of Knowledge:  Fair  Language:  Fair  Akathisia:  No  Handed:  Right  AIMS (if indicated):     Assets:  Desire for Improvement Financial Resources/Insurance Housing Intimacy Resilience Social Support  ADL's:  Impaired  Cognition:   Impaired,  Mild  Sleep:        Treatment Plan Summary: Daily contact with patient to assess and evaluate symptoms and progress in treatment, Medication management and Plan Given his behavior when we tried to discharge him it appears that the best thing to do at this point is admitting him to the hospital. Patient may need to have a brief period of time to pull himself together emotionally before he will be ready for discharge. Patient agrees to plan. Orders will be put in for admission to the hospital at this point with primary diagnosis schizoaffective disorder.  Disposition: Recommend psychiatric Inpatient admission when medically cleared. Supportive therapy provided about ongoing stressors. Discussed crisis plan, support from social network, calling 911, coming to the Emergency Department, and calling Suicide Hotline.  John [name], MD 03/31/2016 1:01 PM 

## 2016-04-01 DIAGNOSIS — F840 Autistic disorder: Secondary | ICD-10-CM | POA: Diagnosis not present

## 2016-04-01 DIAGNOSIS — R441 Visual hallucinations: Secondary | ICD-10-CM | POA: Diagnosis not present

## 2016-04-01 LAB — GLUCOSE, CAPILLARY
GLUCOSE-CAPILLARY: 95 mg/dL (ref 65–99)
Glucose-Capillary: 185 mg/dL — ABNORMAL HIGH (ref 65–99)

## 2016-04-01 NOTE — ED Notes (Signed)
Pt will be going home per [name], brother and group home have been contacted. Pts brother will pick pt up between 6 and 7 and transport pt to group home.

## 2016-04-01 NOTE — ED Notes (Signed)
Pt given meal

## 2016-04-01 NOTE — Consult Note (Signed)
[facility] Consult   Reason for Consult:  Consult for 60 year old man with a history of autistic disorder and schizoaffective disorder who presents to the hospital after going to the police and claiming to be suicidal. Referring Physician:  Edd Fabian Patient Identification: FABER GELTZ MRN:  [id number] Principal Diagnosis: Schizoaffective disorder [facility]) Diagnosis:   Patient Active Problem List   Diagnosis Date Noted  . Diabetes ([place]) [E11.9] 04/10/2015  . Hypertension [I10] 04/10/2015  . Prostate hypertrophy [N40.0] 04/10/2015  . Schizoaffective disorder ([place]) [F25.9] 04/10/2015  . Enlarged prostate [N40.0] 04/10/2015  . Essential (primary) hypertension [I10] 04/10/2015  . Type 2 diabetes mellitus ([place]) [E11.9] 04/10/2015  . Controlled type 2 diabetes mellitus without complication ([place]) [id number] 04/10/2015  . Schizoaffective disorder, bipolar type ([place]) [F25.0] 12/29/2014  . Active autistic disorder [F84.0] 12/29/2014    Total Time spent with patient: 20 minutes  Subjective:   IAIN ZAKOWSKI is a 60 y.o. male patient admitted with "they are just not listening to me and not treating me right".  Follow-up for Friday the 22nd. Patient was calm and has been cooperative all day. Appropriate self-care. Taking a shower. Eating appropriately. We were able to have a very normal and appropriate conversation. He made good eye contact and kept his tone of voice appropriate. He responded and listen to what I was saying. Patient denies any suicidal thoughts. Denies any homicidal thoughts. He is able to be more appropriate in understanding that being admitted to the hospital is not necessary right now. He appears to be safe and back to his baseline.  HPI:  Patient interviewed. Chart reviewed. Patient is very well known to me since I see him as an outpatient and have been following him for years. This 60 year old man has a long-standing history of affect of symptoms and behavior problems  and cognitive impairment. He currently resides at a group home. Evidently he got agitated yesterday as he occasionally does and walked out of the group home all the way to a police station where he made comments about being suicidal. View today the patient does not directly discuss anything about being suicidal but instead wants to have the same conversation we have had many times before about his belief that he should be allowed to do more recreational activities and be given more independence. He denies there is been any change in his medication. Denies noncompliance with his medicine. Doesn't abuse alcohol or drugs. He's not able to identify any single 1 stress that might of really set him off yesterday. He admits that he does not want to die he simply wants to be given more recreational activities. Also has no thought or intention to hurt anybody else.  Social history: [name] is his guardian. [name] is actually very involved in Daquavion's life and visits with him pretty much every week and takes him out on activities. Jess is currently living in a group home and has been in this one longer than any other group home I've known of any in general seems to be doing quite well still prone to these kind of outbursts.  Medical history: Diabetes requiring insulin and oral medicine, hypertension  Substance abuse history: No history of substance abuse  Past Psychiatric History: Patient had been diagnosed with seasonal affective disorder. I've been working with him for a few years and in that time I have never seen him actually appear to be psychotic. He is clearly limited cognitively but not in the same way you would  normally expect from most people with intellectual disability. Rather, he is very emotionally immature and has an extremely difficult time tolerating any change in his routine kind of emotional distress. I firmly believe that his primary problem is an autistic spectrum condition although he  is on number of medicines for mood stability that do seem to be helpful for him. I have not known him to actually try to hurt himself although when he is frustrated he has occasionally impulsively run across streets without looking. I have never known him to actually try and hurt anyone else.  Risk to Self: Suicidal Ideation: No Suicidal Intent: No Is patient at risk for suicide?: No Suicidal Plan?: No Access to Means: No What has been your use of drugs/alcohol within the last 12 months?: None Reported How many times?: 0 Other Self Harm Risks: None Reported Triggers for Past Attempts: None known Intentional Self Injurious Behavior: None Risk to Others: Homicidal Ideation: No (Pt reports having intrusive thoughts) Thoughts of Harm to Others: No Current Homicidal Intent: No Current Homicidal Plan: No Access to Homicidal Means: No Identified Victim: N/A History of harm to others?: No Assessment of Violence: None Noted Violent Behavior Description: N/A Does patient have access to weapons?: No Criminal Charges Pending?: No Does patient have a court date: No Prior Inpatient Therapy: Prior Inpatient Therapy: Yes Prior Therapy Dates: [facility] - Unable to recall Prior Therapy Facilty/Provider(s): [facility] Reason for Treatment: Depression Prior Outpatient Therapy: Prior Outpatient Therapy: No Prior Therapy Dates: N/A Prior Therapy Facilty/Provider(s): N/A Reason for Treatment: N/A Does patient have an ACCT team?: No Does patient have Intensive In-House Services?  : No Does patient have [facility] services? : No Does patient have P4CC services?: Unknown  Past Medical History:  Past Medical History:  Diagnosis Date  . Anxiety   . Anxiety disorder due to known physiological condition   . Arthritis   . Autistic disorder, residual state   . Developmental disorder   . Diabetes mellitus without complication ([place])   . Dyslipidemia   . Esophageal reflux   . Hypertension   . Overactive bladder   .  Schizophrenia, schizoaffective ([place])   . Sleep apnea   . Urinary incontinence     Past Surgical History:  Procedure Laterality Date  . TONSILLECTOMY     Family History:  Family History  Problem Relation Age of Onset  . Family history unknown: Yes   Family Psychiatric  History: None identified. Parents are deceased. [name] is his closest relative and his guardian. Social History:  History  Alcohol Use No     History  Drug Use No    Social History   Social History  . Marital status: Single    Spouse name: N/A  . Number of children: N/A  . Years of education: N/A   Social History Main Topics  . Smoking status: Never Smoker  . Smokeless tobacco: Never Used  . Alcohol use No  . Drug use: No  . Sexual activity: No   Other Topics Concern  . None   Social History Narrative   The patient never finished high school but did get his GED. 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 He is currently in disability and [name] is his legal guardian. He has been living in group homes for many years.      No pending legal charges   Additional Social History:  Allergies:   Allergies  Allergen Reactions  . Cortizone-10  [Hydrocortisone] Other (See Comments)  . Other Other (See Comments)    MYACINS  . Penicillins Nausea And Vomiting    Labs:  Results for orders placed or performed during the hospital encounter of 03/29/16 (from the past 48 hour(s))  Glucose, capillary     Status: Abnormal   Collection Time: 03/30/16  5:32 PM  Result Value Ref Range   Glucose-Capillary 232 (H) 65 - 99 mg/dL  Glucose, capillary     Status: None   Collection Time: 03/31/16  8:06 AM  Result Value Ref Range   Glucose-Capillary 93 65 - 99 mg/dL  Glucose, capillary     Status: Abnormal   Collection Time: 03/31/16 12:10 PM  Result Value Ref Range   Glucose-Capillary 121 (H) 65 - 99 mg/dL  Glucose, capillary      Status: None   Collection Time: 04/01/16  6:37 AM  Result Value Ref Range   Glucose-Capillary 95 65 - 99 mg/dL  Glucose, capillary     Status: Abnormal   Collection Time: 04/01/16  2:35 PM  Result Value Ref Range   Glucose-Capillary 185 (H) 65 - 99 mg/dL    Current Facility-Administered Medications  Medication Dose Route Frequency Provider Last Rate Last Dose  . divalproex (DEPAKOTE ER) 24 hr tablet 2,000 mg  2,000 mg Oral QHS Gonzella Lex, MD   2,000 mg at 03/31/16 2208  . gabapentin (NEURONTIN) capsule 100 mg  100 mg Oral TID Delman Kitten, MD   100 mg at 04/01/16 0945  . insulin glargine (LANTUS) injection 12 Units  12 Units Subcutaneous QHS Gonzella Lex, MD   12 Units at 03/30/16 2141  . lamoTRIgine (LAMICTAL) tablet 100 mg  100 mg Oral QHS Delman Kitten, MD   100 mg at 03/31/16 2208  . linagliptin (TRADJENTA) tablet 5 mg  5 mg Oral Daily Gonzella Lex, MD   5 mg at 04/01/16 0942  . lisinopril (PRINIVIL,ZESTRIL) tablet 2.5 mg  2.5 mg Oral Daily Delman Kitten, MD   2.5 mg at 04/01/16 0942  . LORazepam (ATIVAN) tablet 0.5 mg  0.5 mg Oral TID Delman Kitten, MD   0.5 mg at 04/01/16 0942  . lurasidone (LATUDA) tablet 40 mg  40 mg Oral Q breakfast Delman Kitten, MD   40 mg at 04/01/16 0817  . metoprolol succinate (TOPROL-XL) 24 hr tablet 25 mg  25 mg Oral Daily Delman Kitten, MD   25 mg at 04/01/16 0943  . QUEtiapine (SEROQUEL) tablet 100 mg  100 mg Oral Q6H PRN Gonzella Lex, MD      . QUEtiapine (SEROQUEL) tablet 100 mg  100 mg Oral TID Gonzella Lex, MD   100 mg at 04/01/16 0940  . QUEtiapine (SEROQUEL) tablet 400 mg  400 mg Oral QHS Gonzella Lex, MD   400 mg at 03/31/16 2210  . sertraline (ZOLOFT) tablet 200 mg  200 mg Oral Daily Gonzella Lex, MD   200 mg at 04/01/16 1045   Current Outpatient Prescriptions  Medication Sig Dispense Refill  . Cholecalciferol (VITAMIN D-1000 MAX ST) 1000 UNITS tablet Take 5,000 Units by mouth. Reported on 08/03/2015    . divalproex (DEPAKOTE ER) 500 MG 24 hr  tablet Take 4 tablets (2,000 mg total) by mouth at bedtime. 120 tablet 5  . furosemide (LASIX) 20 MG tablet Take by mouth.    . gabapentin (NEURONTIN) 100 MG capsule Take 1 capsule (100 mg total)  by mouth 3 (three) times daily. 90 capsule 5  . insulin glargine (LANTUS) 100 UNIT/ML injection Inject 0.12 mLs (12 Units total) into the skin at bedtime. 30 mL 0  . lamoTRIgine (LAMICTAL) 100 MG tablet Take 1 tablet (100 mg total) by mouth at bedtime. 30 tablet 5  . lisinopril (PRINIVIL,ZESTRIL) 5 MG tablet Take by mouth.    . loratadine (CLARITIN) 10 MG tablet Take 1 tablet (10 mg total) by mouth daily. 30 tablet 5  . LORazepam (ATIVAN) 0.5 MG tablet Take 1 tablet (0.5 mg total) by mouth 3 (three) times daily. 90 tablet 5  . lurasidone (LATUDA) 40 MG TABS tablet Take 1 tablet (40 mg total) by mouth daily with breakfast. 30 tablet 5  . metoprolol succinate (TOPROL-XL) 50 MG 24 hr tablet Take by mouth.    . naproxen (NAPROSYN) 500 MG tablet Take 1 tablet (500 mg total) by mouth 2 (two) times daily as needed for moderate pain (not to exceed 2 doses in 24 hours). 60 tablet 5  . QUEtiapine (SEROQUEL) 100 MG tablet Take 1 tablet (100 mg total) by mouth every 6 (six) hours as needed (agitation). 60 tablet 3  . QUEtiapine (SEROQUEL) 100 MG tablet Take 1 tablet (100 mg total) by mouth 3 (three) times daily. 90 tablet 5  . QUEtiapine (SEROQUEL) 400 MG tablet Take 1 tablet (400 mg total) by mouth at bedtime. 30 tablet 3  . sertraline (ZOLOFT) 100 MG tablet Take two tablets (200mg ) by mouth once daily 60 tablet 3  . simvastatin (ZOCOR) 20 MG tablet Take 20 mg by mouth daily.    . sitaGLIPtin (JANUVIA) 100 MG tablet Take 100 mg by mouth daily.    . solifenacin (VESICARE) 5 MG tablet Take 5 mg by mouth daily.      Musculoskeletal: Strength & Muscle Tone: within normal limits Gait & Station: normal Patient leans: N/A  Psychiatric Specialty Exam: Physical Exam  Nursing note and vitals reviewed. Constitutional:  He appears well-developed and well-nourished.  HENT:  Head: Normocephalic and atraumatic.  Eyes: Conjunctivae are normal. Pupils are equal, round, and reactive to light.  Neck: Normal range of motion.  Cardiovascular: Regular rhythm and normal heart sounds.   Respiratory: Effort normal. No respiratory distress.  GI: Soft.  Musculoskeletal: Normal range of motion.  Neurological: He is alert.  Skin: Skin is warm and dry.  Psychiatric: His speech is normal and behavior is normal. Judgment normal. His affect is blunt. His affect is not labile and not inappropriate. He is not agitated and not actively hallucinating. Thought content is not paranoid and not delusional. He does not express impulsivity. He expresses no homicidal and no suicidal ideation. He exhibits abnormal recent memory.    Review of Systems  Constitutional: Negative.   HENT: Negative.   Eyes: Negative.   Respiratory: Negative.   Cardiovascular: Negative.   Gastrointestinal: Negative.   Musculoskeletal: Negative.   Skin: Negative.   Neurological: Negative.   Psychiatric/Behavioral: Negative for depression, hallucinations, memory loss, substance abuse and suicidal ideas. The patient is not nervous/anxious and does not have insomnia.     Blood pressure 109/82, pulse 100, temperature 98.1 F (36.7 C), temperature source Oral, resp. rate 18, height 5\' 8"  (1.727 m), weight 104.3 kg (230 lb), SpO2 96 %.Body mass index is 34.97 kg/m.  General Appearance: Fairly Groomed  Eye Contact:  Good  Speech:  Clear and Coherent  Volume:  Increased  Mood:  Anxious  Affect:  Appropriate  Thought Process:  Disorganized  Orientation:  Full (Time, Place, and Person)  Thought Content:  Tangential  Suicidal Thoughts:  No  Homicidal Thoughts:  No  Memory:  Immediate;   Good Recent;   Fair Remote;   Fair  Judgement:  Fair  Insight:  Shallow  Psychomotor Activity:  Normal  Concentration:  Concentration: Fair  Recall:  [employer] of  Knowledge:  Fair  Language:  Fair  Akathisia:  No  Handed:  Right  AIMS (if indicated):     Assets:  Desire for Improvement Financial Resources/Insurance Housing Intimacy Resilience Social Support  ADL's:  Impaired  Cognition:  Impaired,  Mild  Sleep:        Treatment Plan Summary: Daily contact with patient to assess and evaluate symptoms and progress in treatment, Medication management and Plan Patient interviewed. Spoke with nursing on duty. Spoke with social work and TTS. Mr. Lauderdale, as I expected, has calm down and is much more appropriate in his behavior today. We again discussed his long-standing complaints and concerns. I tried to do some active and open listening and make it clear to him that I understood what his concerns were. Patient is not reporting any suicidal thoughts. He does not appear to be psychotic. No evidence of delusions or hallucinations. He has been basically taking care of himself and has not been aggressive. I proposed to him that perhaps he was feeling well enough to cooperate with going back home and he agrees to the plan. I spoke with social work and TTS. We will see if we can contact his group home and if they are agreeable to coming in picking him up and I will continue to follow him up as an outpatient.  Disposition: Patient does not meet criteria for psychiatric inpatient admission. Supportive therapy provided about ongoing stressors. Discussed crisis plan, support from social network, calling 911, coming to the Emergency Department, and calling Suicide Hotline.  Alethia Berthold, MD 04/01/2016 2:45 PM

## 2016-04-01 NOTE — ED Notes (Signed)
Patient refused a shower at this time, he stated that he wanted to take a nap first then shower.

## 2016-04-01 NOTE — Progress Notes (Signed)
LCSW spoke with Alexander Beard Administrator of [place] [phone] and she will attempt to reach his brother for pick up. Patient can return to [facility] and follow up with Dr Weber Cooks.  LCSW called Alexander Beard- Brother [phone] He will be picking up his brother at 6-7pm tonight after work.  [employer] LCSW [phone]

## 2016-04-01 NOTE — ED Provider Notes (Signed)
-----------------------------------------   3:39 PM on 04/01/2016 -----------------------------------------   Blood pressure 109/82, pulse 100, temperature 98.1 F (36.7 C), temperature source Oral, resp. rate 18, height 5\' 8"  (1.727 m), weight 230 lb (104.3 kg), SpO2 96 %.  The patient had no acute events since last update.  Calm and cooperative at this time.   Patient has been cleared home by [name]. Patient's brother will be picking up the patient at 66 PM tonight.    Orbie Pyo, MD 04/01/16 1539

## 2016-04-01 NOTE — ED Notes (Signed)
Pt in shower. Given clean clothes.  

## 2016-04-01 NOTE — ED Provider Notes (Signed)
Vitals:   03/31/16 2143 04/01/16 0629  BP: 113/84 125/79  Pulse: 90 (!) 59  Resp: 18 18  Temp:     Patient remains medically stable for psychiatric disposition.   Earleen Newport, MD 04/01/16 [phone]

## 2016-04-01 NOTE — Progress Notes (Signed)
LCSW consulted with Dr Weber Cooks and it was concluded that the patient is ready to return to the group home and has a current follow up appointment with Dr Weber Cooks. The patient is not suicidal or angry and wants to return home.  [employer] LCSW [phone]

## 2016-04-18 ENCOUNTER — Ambulatory Visit (INDEPENDENT_AMBULATORY_CARE_PROVIDER_SITE_OTHER): Payer: Medicare Other | Admitting: Psychiatry

## 2016-04-18 ENCOUNTER — Encounter: Payer: Self-pay | Admitting: Psychiatry

## 2016-04-18 ENCOUNTER — Other Ambulatory Visit: Payer: Self-pay | Admitting: Psychiatry

## 2016-04-18 VITALS — BP 106/68 | HR 65 | Temp 98.4°F | Wt 229.2 lb

## 2016-04-18 DIAGNOSIS — F250 Schizoaffective disorder, bipolar type: Secondary | ICD-10-CM | POA: Diagnosis not present

## 2016-04-18 DIAGNOSIS — F840 Autistic disorder: Secondary | ICD-10-CM | POA: Diagnosis not present

## 2016-04-18 MED ORDER — QUETIAPINE FUMARATE 100 MG PO TABS: 100.0000 mg | ORAL_TABLET | Freq: Three times a day (TID) | ORAL | 5 refills | Status: DC

## 2016-05-06 ENCOUNTER — Encounter: Payer: Self-pay | Admitting: *Deleted

## 2016-05-09 ENCOUNTER — Ambulatory Visit
Admission: RE | Admit: 2016-05-09 | Discharge: 2016-05-09 | Disposition: A | Discharge status: Home / Self Care | Payer: Medicare Other | Source: Ambulatory Visit | Attending: Unknown Physician Specialty | Admitting: Unknown Physician Specialty

## 2016-05-09 ENCOUNTER — Ambulatory Visit: Payer: Medicare Other | Admitting: Certified Registered Nurse Anesthetist

## 2016-05-09 ENCOUNTER — Encounter
Admission: RE | Disposition: A | Discharge status: Home / Self Care | Payer: Self-pay | Source: Ambulatory Visit | Attending: Unknown Physician Specialty

## 2016-05-09 DIAGNOSIS — K210 Gastro-esophageal reflux disease with esophagitis: Secondary | ICD-10-CM | POA: Insufficient documentation

## 2016-05-09 DIAGNOSIS — K644 Residual hemorrhoidal skin tags: Secondary | ICD-10-CM | POA: Insufficient documentation

## 2016-05-09 DIAGNOSIS — F064 Anxiety disorder due to known physiological condition: Secondary | ICD-10-CM | POA: Insufficient documentation

## 2016-05-09 DIAGNOSIS — Z6834 Body mass index (BMI) 34.0-34.9, adult: Secondary | ICD-10-CM | POA: Insufficient documentation

## 2016-05-09 DIAGNOSIS — D120 Benign neoplasm of cecum: Secondary | ICD-10-CM | POA: Insufficient documentation

## 2016-05-09 DIAGNOSIS — Z794 Long term (current) use of insulin: Secondary | ICD-10-CM | POA: Diagnosis not present

## 2016-05-09 DIAGNOSIS — K2950 Unspecified chronic gastritis without bleeding: Secondary | ICD-10-CM | POA: Insufficient documentation

## 2016-05-09 DIAGNOSIS — E669 Obesity, unspecified: Secondary | ICD-10-CM | POA: Insufficient documentation

## 2016-05-09 DIAGNOSIS — K640 First degree hemorrhoids: Secondary | ICD-10-CM | POA: Diagnosis not present

## 2016-05-09 DIAGNOSIS — K625 Hemorrhage of anus and rectum: Secondary | ICD-10-CM | POA: Insufficient documentation

## 2016-05-09 DIAGNOSIS — D123 Benign neoplasm of transverse colon: Secondary | ICD-10-CM | POA: Diagnosis not present

## 2016-05-09 DIAGNOSIS — I10 Essential (primary) hypertension: Secondary | ICD-10-CM | POA: Diagnosis not present

## 2016-05-09 DIAGNOSIS — F840 Autistic disorder: Secondary | ICD-10-CM | POA: Diagnosis not present

## 2016-05-09 DIAGNOSIS — R12 Heartburn: Secondary | ICD-10-CM | POA: Diagnosis present

## 2016-05-09 DIAGNOSIS — F259 Schizoaffective disorder, unspecified: Secondary | ICD-10-CM | POA: Diagnosis not present

## 2016-05-09 DIAGNOSIS — F329 Major depressive disorder, single episode, unspecified: Secondary | ICD-10-CM | POA: Diagnosis not present

## 2016-05-09 DIAGNOSIS — Z791 Long term (current) use of non-steroidal anti-inflammatories (NSAID): Secondary | ICD-10-CM | POA: Insufficient documentation

## 2016-05-09 DIAGNOSIS — N3281 Overactive bladder: Secondary | ICD-10-CM | POA: Insufficient documentation

## 2016-05-09 DIAGNOSIS — E119 Type 2 diabetes mellitus without complications: Secondary | ICD-10-CM | POA: Diagnosis not present

## 2016-05-09 DIAGNOSIS — Z79899 Other long term (current) drug therapy: Secondary | ICD-10-CM | POA: Diagnosis not present

## 2016-05-09 DIAGNOSIS — G4730 Sleep apnea, unspecified: Secondary | ICD-10-CM | POA: Insufficient documentation

## 2016-05-09 DIAGNOSIS — E785 Hyperlipidemia, unspecified: Secondary | ICD-10-CM | POA: Insufficient documentation

## 2016-05-09 HISTORY — DX: Obesity, unspecified: E66.9

## 2016-05-09 HISTORY — DX: Major depressive disorder, single episode, unspecified: F32.9

## 2016-05-09 HISTORY — DX: Depression, unspecified: F32.A

## 2016-05-09 HISTORY — PX: ESOPHAGOGASTRODUODENOSCOPY: SHX5428

## 2016-05-09 HISTORY — PX: COLONOSCOPY WITH PROPOFOL: SHX5780

## 2016-05-09 LAB — GLUCOSE, CAPILLARY: Glucose-Capillary: 83 mg/dL (ref 65–99)

## 2016-05-09 LAB — CBC
HEMATOCRIT: 35.6 % — AB (ref 40.0–52.0)
Hemoglobin: 12.5 g/dL — ABNORMAL LOW (ref 13.0–18.0)
MCH: 33.4 pg (ref 26.0–34.0)
MCHC: 35.1 g/dL (ref 32.0–36.0)
MCV: 95.2 fL (ref 80.0–100.0)
Platelets: 125 10*3/uL — ABNORMAL LOW (ref 150–440)
RBC: 3.74 MIL/uL — ABNORMAL LOW (ref 4.40–5.90)
RDW: 13 % (ref 11.5–14.5)
WBC: 5.3 10*3/uL (ref 3.8–10.6)

## 2016-05-09 SURGERY — EGD (ESOPHAGOGASTRODUODENOSCOPY)
Anesthesia: General

## 2016-05-09 MED ORDER — PROPOFOL 500 MG/50ML IV EMUL
INTRAVENOUS | Status: DC | PRN
  Administered 2016-05-09: 140 ug/kg/min via INTRAVENOUS

## 2016-05-09 MED ORDER — PROPOFOL 10 MG/ML IV BOLUS
INTRAVENOUS | Status: DC | PRN
  Administered 2016-05-09: 50 mg via INTRAVENOUS

## 2016-05-09 MED ORDER — LIDOCAINE HCL (CARDIAC) 20 MG/ML IV SOLN
INTRAVENOUS | Status: DC | PRN
  Administered 2016-05-09: 100 mg via INTRAVENOUS

## 2016-05-09 MED ORDER — PHENYLEPHRINE HCL 10 MG/ML IJ SOLN
INTRAMUSCULAR | Status: DC | PRN
  Administered 2016-05-09: 200 ug via INTRAVENOUS
  Administered 2016-05-09: 100 ug via INTRAVENOUS
  Administered 2016-05-09: 200 ug via INTRAVENOUS
  Administered 2016-05-09: 100 ug via INTRAVENOUS
  Administered 2016-05-09 (×2): 200 ug via INTRAVENOUS
  Administered 2016-05-09: 100 ug via INTRAVENOUS
  Administered 2016-05-09 (×3): 200 ug via INTRAVENOUS

## 2016-05-09 MED ORDER — EPHEDRINE SULFATE 50 MG/ML IJ SOLN
INTRAMUSCULAR | Status: DC | PRN
  Administered 2016-05-09: 10 mg via INTRAVENOUS
  Administered 2016-05-09: 5 mg via INTRAVENOUS
  Administered 2016-05-09: 10 mg via INTRAVENOUS

## 2016-05-09 MED ORDER — SODIUM CHLORIDE 0.9 % IV SOLN
INTRAVENOUS | Status: DC
  Administered 2016-05-09: 1000 mL via INTRAVENOUS

## 2016-05-09 NOTE — Op Note (Signed)
[facility] Gastroenterology Patient Name: Alexander Beard Procedure Date: 05/09/2016 8:53 AM MRN: [id number] Account #: [id number] Date of Birth: [birth year] Admit Type: Outpatient Age: 60 Room: [facility] ENDO ROOM 4 Gender: Male Note Status: Finalized Procedure:            Upper GI endoscopy Indications:          Heartburn, Suspected gastro-esophageal reflux disease Providers:            Manya Silvas, MD Referring MD:         Jordan Likes. Lavena Bullion (Referring MD) Medicines:            Propofol per Anesthesia Complications:        No immediate complications. Procedure:            Pre-Anesthesia Assessment:                       - After reviewing the risks and benefits, the patient                        was deemed in satisfactory condition to undergo the                        procedure.                       After obtaining informed consent, the endoscope was                        passed under direct vision. Throughout the procedure,                        the patient's blood pressure, pulse, and oxygen                        saturations were monitored continuously. The                        Colonoscope was introduced through the mouth, and                        advanced to the second part of duodenum. The upper GI                        endoscopy was accomplished without difficulty. The                        patient tolerated the procedure well. Findings:      LA Grade A (one or more mucosal breaks less than 5 mm, not extending       between tops of 2 mucosal folds) esophagitis with no bleeding was found       41 cm from the incisors. Biopsies were taken with a cold forceps for       histology.      Diffuse mild inflammation characterized by erythema and granularity was       found in the gastric body. Biopsies were taken with a cold forceps for       histology. Biopsies were taken with a cold forceps for Helicobacter       pylori testing.      The examined  duodenum was normal. Impression:           -  LA Grade A reflux esophagitis. Rule out Barrett's                        esophagus. Biopsied.                       - Gastritis. Biopsied.                       - Normal examined duodenum. Recommendation:       - Await pathology results.                       - Perform a colonoscopy as previously scheduled. Procedure Code(s):    --- Professional ---                       [phone], Esophagogastroduodenoscopy, flexible, transoral;                        with biopsy, single or multiple Diagnosis Code(s):    --- Professional ---                       K21.0, Gastro-esophageal reflux disease with esophagitis                       K29.70, Gastritis, unspecified, without bleeding                       R12, Heartburn CPT copyright 2016 American Medical Association. All rights reserved. The codes documented in this report are preliminary and upon coder review may  be revised to meet current compliance requirements. Manya Silvas, MD 05/09/2016 9:06:34 AM This report has been signed electronically. Number of Addenda: 0 Note Initiated On: 05/09/2016 8:53 AM      [facility]

## 2016-05-09 NOTE — Anesthesia Preprocedure Evaluation (Signed)
Anesthesia Evaluation  Patient identified by MRN, date of birth, ID band Patient awake    Reviewed: Allergy & Precautions, NPO status , Patient's Chart, lab work & pertinent test results  History of Anesthesia Complications Negative for: history of anesthetic complications  Airway Mallampati: II  TM Distance: >3 FB Neck ROM: Full    Dental no notable dental hx.    Pulmonary sleep apnea , neg COPD,    breath sounds clear to auscultation- rhonchi (-) wheezing      Cardiovascular Exercise Tolerance: Good hypertension, (-) CAD and (-) Past MI  Rhythm:Regular Rate:Normal - Systolic murmurs and - Diastolic murmurs    Neuro/Psych Anxiety Depression Bipolar Disorder    GI/Hepatic Neg liver ROS, GERD  ,  Endo/Other  diabetes, Type 2  Renal/GU negative Renal ROS     Musculoskeletal  (+) Arthritis ,   Abdominal (+) + obese,   Peds  Hematology negative hematology ROS (+)   Anesthesia Other Findings Past Medical History: No date: Anxiety No date: Anxiety disorder due to known physiological co* No date: Arthritis No date: Autistic disorder, residual state No date: Depression No date: Developmental disorder No date: Diabetes mellitus without complication ([facility]) No date: Dyslipidemia No date: Esophageal reflux No date: Hypertension No date: Obesity No date: Overactive bladder No date: Schizophrenia, schizoaffective (H[facility]) No date: Sleep apnea No date: Urinary incontinence   Reproductive/Obstetrics                             Anesthesia Physical Anesthesia Plan  ASA: III  Anesthesia Plan: General   Post-op Pain Management:    Induction: Intravenous  Airway Management Planned: Natural Airway  Additional Equipment:   Intra-op Plan:   Post-operative Plan:   Informed Consent: I have reviewed the patients History and Physical, chart, labs and discussed the procedure including the risks,  benefits and alternatives for the proposed anesthesia with the patient or authorized representative who has indicated his/her understanding and acceptance.   Dental advisory given  Plan Discussed with: Anesthesiologist and CRNA  Anesthesia Plan Comments:         Anesthesia Quick Evaluation

## 2016-05-09 NOTE — Transfer of Care (Signed)
Immediate Anesthesia Transfer of Care Note  Patient: Alexander Beard  Procedure(s) Performed: Procedure(s): ESOPHAGOGASTRODUODENOSCOPY (EGD) (N/A) COLONOSCOPY WITH PROPOFOL (N/A)  Patient Location: PACU  Anesthesia Type:General  Level of Consciousness: sedated  Airway & Oxygen Therapy: Patient Spontanous Breathing and Patient connected to nasal cannula oxygen  Post-op Assessment: Report given to RN and Post -op Vital signs reviewed and stable  Post vital signs: Reviewed and stable  Last Vitals:  Vitals:   05/09/16 0821  BP: (!) 100/54  Pulse: (!) 109  Resp: 16  Temp: 36.5 C    Last Pain:  Vitals:   05/09/16 0821  TempSrc: Tympanic         Complications: No apparent anesthesia complications

## 2016-05-09 NOTE — Op Note (Addendum)
[facility] Gastroenterology Patient Name: Alexander Beard Procedure Date: 05/09/2016 8:53 AM MRN: [id number] Account #: [id number] Date of Birth: April 08, 1956 Admit Type: Outpatient Age: 60 Room: [facility] ENDO ROOM 4 Gender: Male Note Status: Finalized Procedure:            Colonoscopy Indications:          Rectal bleeding Providers:            Manya Silvas, MD Referring MD:         Jordan Likes. Lavena Bullion (Referring MD) Medicines:            Propofol per Anesthesia Complications:        No immediate complications. Procedure:            Pre-Anesthesia Assessment:                       - After reviewing the risks and benefits, the patient                        was deemed in satisfactory condition to undergo the                        procedure.                       After obtaining informed consent, the colonoscope was                        passed under direct vision. Throughout the procedure,                        the patient's blood pressure, pulse, and oxygen                        saturations were monitored continuously. The                        Colonoscope was introduced through the anus and                        advanced to the the cecum, identified by appendiceal                        orifice and ileocecal valve. The colonoscopy was                        performed without difficulty. The patient tolerated the                        procedure well. The quality of the bowel preparation                        was excellent. Findings:      External hemorrhoids were found during endoscopy. The hemorrhoids were       small.      Two sessile polyps were found in the transverse colon and cecum. The       polyps were diminutive in size. These polyps were removed with a jumbo       cold forceps. Resection and retrieval were complete.      Internal hemorrhoids were found during endoscopy. The hemorrhoids were  small and Grade I (internal hemorrhoids that do not  prolapse).      The exam was otherwise without abnormality. Impression:           - External hemorrhoids.                       - Two diminutive polyps in the transverse colon and in                        the cecum, removed with a jumbo cold forceps. Resected                        and retrieved.                       - Internal hemorrhoids.                       - The examination was otherwise normal. Recommendation:       - Await pathology results. Manya Silvas, MD 05/09/2016 9:34:36 AM This report has been signed electronically. Number of Addenda: 0 Note Initiated On: 05/09/2016 8:53 AM Scope Withdrawal Time: 0 hours 14 minutes 3 seconds  Total Procedure Duration: 0 hours 23 minutes 46 seconds       [facility]

## 2016-05-09 NOTE — H&P (Signed)
Primary Care Physician:  Vista Mink, FNP Primary Gastroenterologist:  Dr. Vira Agar  Pre-Procedure History & Physical: HPI:  Alexander Beard is a 60 y.o. male is here for an endoscopy and colonoscopy.   Past Medical History:  Diagnosis Date  . Anxiety   . Anxiety disorder due to known physiological condition   . Arthritis   . Autistic disorder, residual state   . Depression   . Developmental disorder   . Diabetes mellitus without complication ([place])   . Dyslipidemia   . Esophageal reflux   . Hypertension   . Obesity   . Overactive bladder   . Schizophrenia, schizoaffective ([place])   . Sleep apnea   . Urinary incontinence     Past Surgical History:  Procedure Laterality Date  . COLONOSCOPY  01/28/2005  . TONSILLECTOMY      Prior to Admission medications   Medication Sig Start Date End Date Taking? Authorizing Provider  Cholecalciferol (VITAMIN D-1000 MAX ST) 1000 UNITS tablet Take 5,000 Units by mouth. Reported on 08/03/2015   Yes Historical Provider, MD  divalproex (DEPAKOTE ER) 500 MG 24 hr tablet Take 4 tablets (2,000 mg total) by mouth at bedtime. 01/25/16  Yes Gonzella Lex, MD  docusate sodium (COLACE) 100 MG capsule Take 100 mg by mouth 2 (two) times daily.   Yes Historical Provider, MD  furosemide (LASIX) 20 MG tablet Take by mouth.   Yes Historical Provider, MD  gabapentin (NEURONTIN) 100 MG capsule Take 1 capsule (100 mg total) by mouth 3 (three) times daily. 01/25/16  Yes Gonzella Lex, MD  insulin glargine (LANTUS) 100 UNIT/ML injection Inject 0.12 mLs (12 Units total) into the skin at bedtime. 04/16/15  Yes Hildred Priest, MD  lamoTRIgine (LAMICTAL) 100 MG tablet Take 1 tablet (100 mg total) by mouth at bedtime. 01/25/16  Yes Gonzella Lex, MD  lisinopril (PRINIVIL,ZESTRIL) 5 MG tablet Take by mouth.   Yes Historical Provider, MD  loratadine (CLARITIN) 10 MG tablet Take 1 tablet (10 mg total) by mouth daily. 04/30/15  Yes Gonzella Lex, MD   LORazepam (ATIVAN) 0.5 MG tablet Take 1 tablet (0.5 mg total) by mouth 3 (three) times daily. 01/25/16  Yes Gonzella Lex, MD  lurasidone (LATUDA) 40 MG TABS tablet Take 1 tablet (40 mg total) by mouth daily with breakfast. 01/25/16  Yes Gonzella Lex, MD  meloxicam (MOBIC) 15 MG tablet Take 15 mg by mouth daily.   Yes Historical Provider, MD  metoprolol succinate (TOPROL-XL) 50 MG 24 hr tablet Take by mouth.   Yes Historical Provider, MD  naproxen (NAPROSYN) 500 MG tablet TAKE ONE TABLET BY MOUTH 2 TIMES A DAY AS NEEDED FOR PAIN *TAKE WITH FOOD* 04/21/16  Yes Gonzella Lex, MD  pantoprazole (PROTONIX) 40 MG tablet Take 40 mg by mouth daily.   Yes Historical Provider, MD  QUEtiapine (SEROQUEL) 100 MG tablet Take 1 tablet (100 mg total) by mouth 3 (three) times daily. Plus one a day as needed for anxiety 04/18/16  Yes Gonzella Lex, MD  QUEtiapine (SEROQUEL) 400 MG tablet Take 1 tablet (400 mg total) by mouth at bedtime. 01/25/16  Yes Gonzella Lex, MD  sertraline (ZOLOFT) 100 MG tablet Take two tablets (200mg ) by mouth once daily 01/25/16  Yes Gonzella Lex, MD  simvastatin (ZOCOR) 20 MG tablet Take 20 mg by mouth daily.   Yes Historical Provider, MD  sitaGLIPtin (JANUVIA) 100 MG tablet Take 100 mg by mouth daily.   Yes  Historical Provider, MD  solifenacin (VESICARE) 5 MG tablet Take 5 mg by mouth daily.   Yes Historical Provider, MD  tamsulosin (FLOMAX) 0.4 MG CAPS capsule Take 0.4 mg by mouth daily.   Yes Historical Provider, MD    Allergies as of 03/18/2016 - Review Complete 01/25/2016  Allergen Reaction Noted  . Cortizone-10  [hydrocortisone] Other (See Comments) 04/30/2015  . Other Other (See Comments) 02/12/2015  . Penicillins Nausea And Vomiting 02/12/2015    Family History  Problem Relation Age of Onset  . Family history unknown: Yes    Social History   Social History  . Marital status: Single    Spouse name: N/A  . Number of children: N/A  . Years of education: N/A    Occupational History  . Not on file.   Social History Main Topics  . Smoking status: Never Smoker  . Smokeless tobacco: Never Used  . Alcohol use No  . Drug use: No  . Sexual activity: No   Other Topics Concern  . Not on file   Social History Narrative   The patient never finished high school but did get his GED. 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 He is currently in disability and [name] is his legal guardian. He has been living in group homes for many years.      No pending legal charges    Review of Systems: See HPI, otherwise negative ROS  Physical Exam: BP (!) 101/57   Pulse 66   Temp (!) 96.4 F (35.8 C) (Tympanic)   Resp 11   Ht 5\' 7"  (1.702 m)   Wt 99.8 kg (220 lb)   SpO2 100%   BMI 34.46 kg/m  General:   Alert,  pleasant and cooperative in NAD Head:  Normocephalic and atraumatic. Neck:  Supple; no masses or thyromegaly. Lungs:  Clear throughout to auscultation.    Heart:  Regular rate and rhythm. Abdomen:  Soft, nontender and nondistended. Normal bowel sounds, without guarding, and without rebound.   Neurologic:  Alert and  oriented x4;  grossly normal neurologically.  Impression/Plan: Alexander Beard is here for an endoscopy and colonoscopy to be performed for [facility] colon polyps, rectal bleeding, GERD  Risks, benefits, limitations, and alternatives regarding  endoscopy and colonoscopy have been reviewed with the patient.  Questions have been answered.  All parties agreeable.   Gaylyn Cheers, MD  05/09/2016, 9:43 AM

## 2016-05-09 NOTE — Anesthesia Postprocedure Evaluation (Signed)
Anesthesia Post Note  Patient: Alexander Beard  Procedure(s) Performed: Procedure(s) (LRB): ESOPHAGOGASTRODUODENOSCOPY (EGD) (N/A) COLONOSCOPY WITH PROPOFOL (N/A)  Patient location during evaluation: Endoscopy Anesthesia Type: General Level of consciousness: awake and alert and oriented Pain management: pain level controlled Vital Signs Assessment: post-procedure vital signs reviewed and stable Respiratory status: spontaneous breathing, nonlabored ventilation and respiratory function stable Cardiovascular status: blood pressure returned to baseline and stable Postop Assessment: no signs of nausea or vomiting Anesthetic complications: no    Last Vitals:  Vitals:   05/09/16 0948 05/09/16 0958  BP: (!) 96/51 (!) 114/59  Pulse: 65 62  Resp: 12 (!) 23  Temp:      Last Pain:  Vitals:   05/09/16 0938  TempSrc: Tympanic                 Sowmya Partridge

## 2016-05-10 ENCOUNTER — Encounter: Payer: Self-pay | Admitting: Unknown Physician Specialty

## 2016-05-10 LAB — SURGICAL PATHOLOGY

## 2016-07-18 ENCOUNTER — Ambulatory Visit: Payer: Medicare Other | Admitting: Psychiatry

## 2016-07-28 ENCOUNTER — Ambulatory Visit: Payer: Medicare Other | Admitting: Psychiatry

## 2016-08-09 ENCOUNTER — Encounter: Payer: Self-pay | Admitting: Psychiatry

## 2016-08-09 ENCOUNTER — Ambulatory Visit (INDEPENDENT_AMBULATORY_CARE_PROVIDER_SITE_OTHER): Payer: Medicare Other | Admitting: Psychiatry

## 2016-08-09 VITALS — BP 101/66 | HR 64 | Temp 97.5°F | Wt 232.4 lb

## 2016-08-09 DIAGNOSIS — F259 Schizoaffective disorder, unspecified: Secondary | ICD-10-CM

## 2016-08-09 DIAGNOSIS — F840 Autistic disorder: Secondary | ICD-10-CM

## 2016-08-09 DIAGNOSIS — F250 Schizoaffective disorder, bipolar type: Secondary | ICD-10-CM | POA: Diagnosis not present

## 2016-08-09 MED ORDER — QUETIAPINE FUMARATE 100 MG PO TABS
100.0000 mg | ORAL_TABLET | Freq: Three times a day (TID) | ORAL | 5 refills | Status: DC
Start: 2016-08-09 — End: 2017-02-03

## 2016-08-09 MED ORDER — LURASIDONE HCL 40 MG PO TABS: 40.0000 mg | ORAL_TABLET | Freq: Every day | ORAL | 5 refills | Status: DC

## 2016-08-09 MED ORDER — LORAZEPAM 0.5 MG PO TABS: 0.5000 mg | ORAL_TABLET | Freq: Three times a day (TID) | ORAL | 5 refills | Status: DC

## 2016-08-09 MED ORDER — GABAPENTIN 100 MG PO CAPS: 100.0000 mg | ORAL_CAPSULE | Freq: Three times a day (TID) | ORAL | 5 refills | Status: DC

## 2016-08-09 MED ORDER — SERTRALINE HCL 100 MG PO TABS: ORAL_TABLET | ORAL | 5 refills | Status: DC

## 2016-08-09 MED ORDER — LAMOTRIGINE 100 MG PO TABS: 100.0000 mg | ORAL_TABLET | Freq: Every day | ORAL | 5 refills | Status: DC

## 2016-08-09 MED ORDER — QUETIAPINE FUMARATE 400 MG PO TABS: 400.0000 mg | ORAL_TABLET | Freq: Every day | ORAL | 5 refills | Status: DC

## 2016-08-09 MED ORDER — DIVALPROEX SODIUM ER 500 MG PO TB24: 2000.0000 mg | ORAL_TABLET | Freq: Every day | ORAL | 5 refills | Status: DC

## 2016-08-09 NOTE — Progress Notes (Signed)
Follow-up for patient with autism and schizoaffective disorder. Patient seen along with the owner of his group home. He had some complaints about the group home itself and one of the staff there. He wanted to use our time today to ventilate. No psychosis however. No suicidal ideation. Tolerating medicine well. Patient acknowledges that it works well.  Neatly dressed and groomed. Eye contact adequate. Psychomotor activity normal. Speech normal. Affect is usually little bit irritable but not severe. No evidence of suicidal or homicidal ideation. No delusional thinking.  No change to medication. Supportive counseling. Review of medication plan. Follow-up in another 6 months.

## 2016-09-23 ENCOUNTER — Other Ambulatory Visit: Payer: Self-pay | Admitting: Psychiatry

## 2016-09-26 ENCOUNTER — Telehealth: Payer: Self-pay

## 2016-09-26 ENCOUNTER — Other Ambulatory Visit: Payer: Self-pay | Admitting: Psychiatry

## 2016-09-26 MED ORDER — LORAZEPAM 0.5 MG PO TABS: 0.5000 mg | ORAL_TABLET | Freq: Three times a day (TID) | ORAL | 5 refills | Status: DC

## 2016-09-26 NOTE — Telephone Encounter (Signed)
Faxed and confirmed rx for ativan [id number] order # [id number]

## 2016-09-26 NOTE — Telephone Encounter (Signed)
Phoned in to [employer]

## 2016-09-26 NOTE — Telephone Encounter (Signed)
Vida Rigger called states that mr. Alexander Beard is out of medication.  pt needs as soon as possible. for ativan

## 2016-09-26 NOTE — Telephone Encounter (Signed)
faxed and confirmed rx for ativan [id number] order # [id number]

## 2016-12-14 ENCOUNTER — Other Ambulatory Visit: Payer: Self-pay | Admitting: Psychiatry

## 2017-01-03 ENCOUNTER — Emergency Department
Admission: EM | Admit: 2017-01-03 | Discharge: 2017-01-03 | Disposition: A | Discharge status: Home / Self Care | Payer: Medicare Other | Attending: Emergency Medicine | Admitting: Emergency Medicine

## 2017-01-03 ENCOUNTER — Encounter: Payer: Self-pay | Admitting: Emergency Medicine

## 2017-01-03 DIAGNOSIS — F259 Schizoaffective disorder, unspecified: Secondary | ICD-10-CM | POA: Diagnosis not present

## 2017-01-03 DIAGNOSIS — E119 Type 2 diabetes mellitus without complications: Secondary | ICD-10-CM | POA: Diagnosis not present

## 2017-01-03 DIAGNOSIS — Z79899 Other long term (current) drug therapy: Secondary | ICD-10-CM | POA: Diagnosis not present

## 2017-01-03 DIAGNOSIS — Z794 Long term (current) use of insulin: Secondary | ICD-10-CM | POA: Insufficient documentation

## 2017-01-03 DIAGNOSIS — R45851 Suicidal ideations: Secondary | ICD-10-CM | POA: Diagnosis not present

## 2017-01-03 DIAGNOSIS — R451 Restlessness and agitation: Secondary | ICD-10-CM | POA: Diagnosis not present

## 2017-01-03 DIAGNOSIS — F329 Major depressive disorder, single episode, unspecified: Secondary | ICD-10-CM | POA: Diagnosis present

## 2017-01-03 DIAGNOSIS — I10 Essential (primary) hypertension: Secondary | ICD-10-CM | POA: Insufficient documentation

## 2017-01-03 LAB — CBC
HCT: 35.4 % — ABNORMAL LOW (ref 40.0–52.0)
Hemoglobin: 12.3 g/dL — ABNORMAL LOW (ref 13.0–18.0)
MCH: 32.9 pg (ref 26.0–34.0)
MCHC: 34.7 g/dL (ref 32.0–36.0)
MCV: 94.8 fL (ref 80.0–100.0)
PLATELETS: 123 10*3/uL — AB (ref 150–440)
RBC: 3.74 MIL/uL — AB (ref 4.40–5.90)
RDW: 12.8 % (ref 11.5–14.5)
WBC: 5.9 10*3/uL (ref 3.8–10.6)

## 2017-01-03 LAB — URINE DRUG SCREEN, QUALITATIVE (ARMC ONLY)
AMPHETAMINES, UR SCREEN: NOT DETECTED
BENZODIAZEPINE, UR SCRN: POSITIVE — AB
Barbiturates, Ur Screen: NOT DETECTED
Cannabinoid 50 Ng, Ur ~~LOC~~: NOT DETECTED
Cocaine Metabolite,Ur ~~LOC~~: NOT DETECTED
MDMA (Ecstasy)Ur Screen: NOT DETECTED
METHADONE SCREEN, URINE: NOT DETECTED
Opiate, Ur Screen: NOT DETECTED
Phencyclidine (PCP) Ur S: NOT DETECTED
Tricyclic, Ur Screen: POSITIVE — AB

## 2017-01-03 LAB — COMPREHENSIVE METABOLIC PANEL
ALBUMIN: 3.6 g/dL (ref 3.5–5.0)
ALT: 7 U/L — ABNORMAL LOW (ref 17–63)
ANION GAP: 7 (ref 5–15)
AST: 24 U/L (ref 15–41)
Alkaline Phosphatase: 51 U/L (ref 38–126)
BILIRUBIN TOTAL: 0.5 mg/dL (ref 0.3–1.2)
BUN: 34 mg/dL — ABNORMAL HIGH (ref 6–20)
CHLORIDE: 100 mmol/L — AB (ref 101–111)
CO2: 27 mmol/L (ref 22–32)
Calcium: 8.8 mg/dL — ABNORMAL LOW (ref 8.9–10.3)
Creatinine, Ser: 1.32 mg/dL — ABNORMAL HIGH (ref 0.61–1.24)
GFR calc Af Amer: >60 mL/min (ref >60)
GFR calc non Af Amer: 57 mL/min — ABNORMAL LOW (ref >60)
GLUCOSE: 103 mg/dL — AB (ref 65–99)
POTASSIUM: 4.8 mmol/L (ref 3.5–5.1)
Sodium: 134 mmol/L — ABNORMAL LOW (ref 135–145)
TOTAL PROTEIN: 6.7 g/dL (ref 6.5–8.1)

## 2017-01-03 LAB — ACETAMINOPHEN LEVEL

## 2017-01-03 LAB — ETHANOL

## 2017-01-03 LAB — SALICYLATE LEVEL: Salicylate Lvl: <7 mg/dL (ref 2.8–30.0)

## 2017-01-03 MED ORDER — LORAZEPAM 1 MG PO TABS: 1.0000 mg | ORAL_TABLET | Freq: Once | ORAL | Status: DC
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
## 2017-01-03 NOTE — ED Notes (Signed)

## 2017-01-03 NOTE — ED Notes (Signed)
Pt ambulated to the bathroom to void and returned to his room without difficulty.
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
## 2017-01-03 NOTE — ED Notes (Signed)
Patient assigned to appropriate care area. Patient oriented to unit/care area: Informed that, for their safety, care areas are designed for safety and monitored by security cameras at all times; and visiting hours explained to patient. Patient verbalizes understanding, and verbal contract for safety obtained. 
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
## 2017-01-03 NOTE — Discharge Instructions (Signed)
You have been seen in the emergency department for a  psychiatric concern. You have been evaluated both medically as well as psychiatrically. Please follow-up with your outpatient resources provided. Return to the emergency department for any worsening symptoms, or any thoughts of hurting yourself or anyone else so that we may attempt to help you. 

## 2017-01-03 NOTE — ED Notes (Signed)
Freda Munro is contact for group home [phone]

## 2017-01-03 NOTE — ED Notes (Signed)
Pt moved to room 21 and given TV remote. I verified with Willow Ora (tech) that urine was sent to the lab. Willow Ora stated he sent urine.  This tech collected it on computer.

## 2017-01-03 NOTE — ED Notes (Signed)
Pt given blanket. Pt ask if there is an art room upstairs that he can go to. I informed pt that I didn't think we have one but we wouldn't have access to it at this time.

## 2017-01-03 NOTE — ED Notes (Signed)
Watch given Alexander Beard from group home to take it back.

## 2017-01-03 NOTE — ED Notes (Signed)
ED BHU [place] Is the patient under IVC or is there intent for IVC: Yes.   Is the patient medically cleared: Yes.   Is there vacancy in the ED BHU: Yes.   Is the population mix appropriate for patient: Yes.   Is the patient awaiting placement in inpatient or outpatient setting: Yes.   Has the patient had a psychiatric consult: Yes.   Survey of unit performed for contraband, proper placement and condition of furniture, tampering with fixtures in bathroom, shower, and each patient room: Yes.  ; Findings: Environment secure.  APPEARANCE/BEHAVIOR calm, cooperative and adequate rapport can be established NEURO ASSESSMENT Orientation: place and person Hallucinations: No.None noted (Hallucinations) Speech: Normal Gait: normal RESPIRATORY ASSESSMENT Normal expansion.  Clear to auscultation.  No rales, rhonchi, or wheezing., No chest wall tenderness., No kyphosis or scoliosis. CARDIOVASCULAR ASSESSMENT regular rate and rhythm, S1, S2 normal, no murmur, click, rub or gallop GASTROINTESTINAL ASSESSMENT soft, nontender, BS WNL, no r/g EXTREMITIES normal strength, tone, and muscle mass, no deformities, no erythema, induration, or nodules, no evidence of joint effusion, ROM of all joints is normal, no evidence of joint instability PLAN OF CARE Provide calm/safe environment. Vital signs assessed twice daily. ED BHU Assessment once each 12-hour shift. Collaborate with intake RN daily or as condition indicates. Assure the ED provider has rounded once each shift. Provide and encourage hygiene. Provide redirection as needed. Assess for escalating behavior; address immediately and inform ED provider.  Assess family dynamic and appropriateness for visitation as needed: No.; If necessary, describe findings: Family is not present therefore family dynamics were not assessed.  Educate the patient/family about BHU procedures/visitation: Yes.  ; If necessary, describe findings: Pt states that he will  follow the rules.

## 2017-02-02 ENCOUNTER — Ambulatory Visit: Payer: Medicare Other | Admitting: Psychiatry

## 2017-02-03 ENCOUNTER — Other Ambulatory Visit: Payer: Self-pay | Admitting: Psychiatry

## 2017-02-03 DIAGNOSIS — F840 Autistic disorder: Secondary | ICD-10-CM

## 2017-02-03 DIAGNOSIS — F259 Schizoaffective disorder, unspecified: Secondary | ICD-10-CM

## 2017-02-08 ENCOUNTER — Emergency Department
Admission: EM | Admit: 2017-02-08 | Discharge: 2017-02-09 | Disposition: A | Discharge status: Home / Self Care | Payer: Medicare Other | Attending: Emergency Medicine | Admitting: Emergency Medicine

## 2017-02-08 DIAGNOSIS — S0990XA Unspecified injury of head, initial encounter: Secondary | ICD-10-CM | POA: Diagnosis present

## 2017-02-08 DIAGNOSIS — Z794 Long term (current) use of insulin: Secondary | ICD-10-CM | POA: Insufficient documentation

## 2017-02-08 DIAGNOSIS — Y998 Other external cause status: Secondary | ICD-10-CM | POA: Diagnosis not present

## 2017-02-08 DIAGNOSIS — B86 Scabies: Secondary | ICD-10-CM | POA: Diagnosis not present

## 2017-02-08 DIAGNOSIS — Z79899 Other long term (current) drug therapy: Secondary | ICD-10-CM | POA: Insufficient documentation

## 2017-02-08 DIAGNOSIS — E119 Type 2 diabetes mellitus without complications: Secondary | ICD-10-CM | POA: Diagnosis not present

## 2017-02-08 DIAGNOSIS — W010XXA Fall on same level from slipping, tripping and stumbling without subsequent striking against object, initial encounter: Secondary | ICD-10-CM | POA: Diagnosis not present

## 2017-02-08 DIAGNOSIS — I10 Essential (primary) hypertension: Secondary | ICD-10-CM | POA: Insufficient documentation

## 2017-02-08 DIAGNOSIS — Y9301 Activity, walking, marching and hiking: Secondary | ICD-10-CM | POA: Diagnosis not present

## 2017-02-08 DIAGNOSIS — Y9289 Other specified places as the place of occurrence of the external cause: Secondary | ICD-10-CM | POA: Insufficient documentation

## 2017-02-08 NOTE — ED Notes (Signed)
Spoke with Dr Archie Balboa regarding pt's presenting c/o; no orders given at this time

## 2017-02-08 NOTE — ED Triage Notes (Signed)
Patient ambulatory to triage with steady gait, without difficulty or distress noted; pt reports tx recently for scabies and has used med week ago but cont with rash and itching; st has been having frequent falls due to loss of balance but has not been evaluated; st fell getting out of shower and again at church while changing clothes; denies any injuries

## 2017-02-09 ENCOUNTER — Ambulatory Visit (INDEPENDENT_AMBULATORY_CARE_PROVIDER_SITE_OTHER): Payer: Medicare Other | Admitting: Psychiatry

## 2017-02-09 ENCOUNTER — Encounter: Payer: Self-pay | Admitting: Psychiatry

## 2017-02-09 ENCOUNTER — Emergency Department: Payer: Medicare Other

## 2017-02-09 VITALS — BP 95/63 | HR 62 | Temp 98.0°F | Wt 225.8 lb

## 2017-02-09 DIAGNOSIS — F250 Schizoaffective disorder, bipolar type: Secondary | ICD-10-CM | POA: Diagnosis not present

## 2017-02-09 DIAGNOSIS — F840 Autistic disorder: Secondary | ICD-10-CM | POA: Diagnosis not present

## 2017-02-09 DIAGNOSIS — S0990XA Unspecified injury of head, initial encounter: Secondary | ICD-10-CM | POA: Diagnosis not present

## 2017-02-09 LAB — CBC WITH DIFFERENTIAL/PLATELET
Basophils Absolute: 0.1 10*3/uL (ref 0–0.1)
Basophils Relative: 1 %
EOS ABS: 0.5 10*3/uL (ref 0–0.7)
Eosinophils Relative: 9 %
HEMATOCRIT: 33.3 % — AB (ref 40.0–52.0)
HEMOGLOBIN: 11.3 g/dL — AB (ref 13.0–18.0)
LYMPHS ABS: 1.8 10*3/uL (ref 1.0–3.6)
LYMPHS PCT: 28 %
MCH: 32.6 pg (ref 26.0–34.0)
MCHC: 33.9 g/dL (ref 32.0–36.0)
MCV: 96.4 fL (ref 80.0–100.0)
MONOS PCT: 17 %
Monocytes Absolute: 1.1 10*3/uL — ABNORMAL HIGH (ref 0.2–1.0)
NEUTROS ABS: 2.9 10*3/uL (ref 1.4–6.5)
NEUTROS PCT: 45 %
Platelets: 138 10*3/uL — ABNORMAL LOW (ref 150–440)
RBC: 3.45 MIL/uL — AB (ref 4.40–5.90)
RDW: 13.6 % (ref 11.5–14.5)
WBC: 6.4 10*3/uL (ref 3.8–10.6)

## 2017-02-09 LAB — COMPREHENSIVE METABOLIC PANEL
ALBUMIN: 3.6 g/dL (ref 3.5–5.0)
ALT: 9 U/L — ABNORMAL LOW (ref 17–63)
AST: 26 U/L (ref 15–41)
Alkaline Phosphatase: 53 U/L (ref 38–126)
Anion gap: 8 (ref 5–15)
BUN: 55 mg/dL — ABNORMAL HIGH (ref 6–20)
CALCIUM: 8.7 mg/dL — AB (ref 8.9–10.3)
CHLORIDE: 104 mmol/L (ref 101–111)
CO2: 25 mmol/L (ref 22–32)
Creatinine, Ser: 2.09 mg/dL — ABNORMAL HIGH (ref 0.61–1.24)
GFR calc non Af Amer: 33 mL/min — ABNORMAL LOW (ref >60)
GFR, EST AFRICAN AMERICAN: 38 mL/min — AB (ref >60)
GLUCOSE: 93 mg/dL (ref 65–99)
POTASSIUM: 5.5 mmol/L — AB (ref 3.5–5.1)
SODIUM: 137 mmol/L (ref 135–145)
Total Bilirubin: 0.4 mg/dL (ref 0.3–1.2)
Total Protein: 7 g/dL (ref 6.5–8.1)

## 2017-02-09 LAB — TROPONIN I: Troponin I: <0.03 ng/mL (ref <0.03)

## 2017-02-09 MED ORDER — HYDROXYZINE HCL 10 MG PO TABS: 10.0000 mg | ORAL_TABLET | Freq: Three times a day (TID) | ORAL | 0 refills | Status: DC | PRN

## 2017-02-09 MED ORDER — PERMETHRIN 5 % EX CREA
TOPICAL_CREAM | Freq: Once | CUTANEOUS | Status: AC
  Administered 2017-02-09: 03:00:00 via TOPICAL
  Filled 2017-02-09: qty 60

## 2017-02-09 MED ORDER — HYDROXYZINE HCL 25 MG PO TABS
50.0000 mg | ORAL_TABLET | Freq: Once | ORAL | Status: AC
  Administered 2017-02-09: 50 mg via ORAL
  Filled 2017-02-09: qty 2

## 2017-02-09 NOTE — Progress Notes (Signed)
Follow-up for this 61 year old man with autistic disorder and schizoaffective disorder. Patient has no new complaints. In fact he seems to be doing quite well. He spent much of the early part of the conversation going over many of the positive things in his life. It wasn't until nearly the end that he started to get onto is complaining about not being able to walk around town. He appears to be neatly dressed and groomed. Tolerating medicine. No complaints about it.  Neatly groomed. Pretty good eye contact. Affect blunted as usual with some irritability but not nearly as bad as usual. No sign of hallucinations or delusions. No suicidal or homicidal thought. Alert and oriented.  Supportive counseling as usual. Encouragement to make the best of his current situation. We talked a little bit about how he sometimes gets too obsessed with certain things and can't get his mind off of it. I was surprised by how insightful he was about it. Follow-up in 6 months no change to medicine

## 2017-02-09 NOTE — ED Provider Notes (Signed)
[facility] Emergency Department Provider Note   First MD Initiated Contact with Patient 02/09/17 0032     (approximate)  I have reviewed the triage vital signs and the nursing notes.   HISTORY  Chief Complaint Fall   HPI Alexander Beard is a 60 y.o. male with below list of chronic medical conditions including recently diagnosed scabies following treatment with her maternal week ago presents to the emergency department with continued pruritic rash. Per the patient's brother patient's primary care provider initially diagnosed the patient with scabies however did obtain a tissue sample which confirmed the presence of scabies   Past Medical History:  Diagnosis Date  . Anxiety   . Anxiety disorder due to known physiological condition   . Arthritis   . Autistic disorder, residual state   . Depression   . Developmental disorder   . Diabetes mellitus without complication ([place])   . Dyslipidemia   . Esophageal reflux   . Hypertension   . Obesity   . Overactive bladder   . Schizophrenia, schizoaffective ([place])   . Sleep apnea   . Urinary incontinence     Patient Active Problem List   Diagnosis Date Noted  . Diabetes ([place]) 04/10/2015  . Hypertension 04/10/2015  . Prostate hypertrophy 04/10/2015  . Schizoaffective disorder ([place]) 04/10/2015  . Enlarged prostate 04/10/2015  . Essential (primary) hypertension 04/10/2015  . Type 2 diabetes mellitus ([place]) 04/10/2015  . Controlled type 2 diabetes mellitus without complication ([place]) 63/14/9702  . Schizoaffective disorder, bipolar type ([place]) 12/29/2014  . Active autistic disorder 12/29/2014    Past Surgical History:  Procedure Laterality Date  . COLONOSCOPY  01/28/2005  . COLONOSCOPY WITH PROPOFOL N/A 05/09/2016   Procedure: COLONOSCOPY WITH PROPOFOL;  Surgeon: Manya Silvas, MD;  Location: [facility] ENDOSCOPY;  Service: Endoscopy;  Laterality: N/A;  . ESOPHAGOGASTRODUODENOSCOPY N/A 05/09/2016   Procedure:  ESOPHAGOGASTRODUODENOSCOPY (EGD);  Surgeon: Manya Silvas, MD;  Location: [facility] ENDOSCOPY;  Service: Endoscopy;  Laterality: N/A;  . TONSILLECTOMY      Prior to Admission medications   Medication Sig Start Date End Date Taking? Authorizing Provider  Cholecalciferol (VITAMIN D-1000 MAX ST) 1000 UNITS tablet Take 5,000 Units by mouth. Reported on 08/03/2015    [provider]  divalproex (DEPAKOTE ER) 500 MG 24 hr tablet TAKE 4 TABLETS (2000MG ) BY MOUTH AT BEDTIME *DO NOT CRUSH* 02/06/17   [name], [name], MD  docusate sodium (COLACE) 100 MG capsule Take 100 mg by mouth 2 (two) times daily.    [provider]  furosemide (LASIX) 20 MG tablet Take by mouth.    [provider]  gabapentin (NEURONTIN) 100 MG capsule TAKE ONE CAPSULE BY MOUTH THREE TIMES A DAY 02/06/17   [name], [name], MD  hydrOXYzine (ATARAX/VISTARIL) 10 MG tablet Take 1 tablet (10 mg total) by mouth 3 (three) times daily as needed. 02/09/17   Gregor Hams, MD  insulin glargine (LANTUS) 100 UNIT/ML injection Inject 0.12 mLs (12 Units total) into the skin at bedtime. 04/16/15   Hildred Priest, MD  lamoTRIgine (LAMICTAL) 100 MG tablet TAKE ONE TABLET BY MOUTH AT BEDTIME. 02/06/17   [name], [name], MD  LATUDA 40 MG TABS tablet TAKE ONE TABLET BY MOUTH EVERY DAY WITH FOOD 02/06/17   [name], [name], MD  lisinopril (PRINIVIL,ZESTRIL) 5 MG tablet Take by mouth.    [provider]  loratadine (CLARITIN) 10 MG tablet Take 1 tablet (10 mg total) by mouth daily. 04/30/15  [name], [name], MD  LORazepam (ATIVAN) 0.5 MG tablet Take 1 tablet (0.5 mg total) by mouth 3 (three) times daily. 09/26/16   [name], [name], MD  meloxicam (MOBIC) 15 MG tablet Take 15 mg by mouth daily.    [provider]  metoprolol succinate (TOPROL-XL) 50 MG 24 hr tablet Take by mouth.    [provider]  naproxen (NAPROSYN) 500 MG tablet TAKE ONE TABLET BY MOUTH 2 TIMES A DAY AS NEEDED FOR PAIN *TAKE  WITH FOOD* 04/21/16   [name], [name], MD  pantoprazole (PROTONIX) 40 MG tablet Take 40 mg by mouth daily.    [provider]  QUEtiapine (SEROQUEL) 100 MG tablet TAKE ONE TABLET BY MOUTH 3 TIMES A DAY TAKE 1 TABLET BY MOUTH DAILY AS NEEDED FOR ANXIETY 02/06/17   [name], [name], MD  QUEtiapine (SEROQUEL) 400 MG tablet TAKE ONE TABLET BY MOUTH AT BEDTIME 02/06/17   [name], [name], MD  sertraline (ZOLOFT) 100 MG tablet TAKE TWO TABLETS (200MG ) BY MOUTH ONCE DAILY 02/06/17   [name], [name], MD  simvastatin (ZOCOR) 20 MG tablet Take 20 mg by mouth daily.    [provider]  sitaGLIPtin (JANUVIA) 100 MG tablet Take 100 mg by mouth daily.    [provider]  solifenacin (VESICARE) 5 MG tablet Take 5 mg by mouth daily.    [provider]  tamsulosin (FLOMAX) 0.4 MG CAPS capsule Take 0.4 mg by mouth daily.    [provider]    Allergies Cortizone-10  [hydrocortisone]; Other; and Penicillins  Family History  Problem Relation Age of Onset  . Family history unknown: Yes    Social History Social History  Substance Use Topics  . Smoking status: Never Smoker  . Smokeless tobacco: Never Used  . Alcohol use No    Review of Systems Constitutional: No fever/chills Eyes: No visual changes. ENT: No sore throat. Cardiovascular: Denies chest pain. Respiratory: Denies shortness of breath. Gastrointestinal: No abdominal pain.  No nausea, no vomiting.  No diarrhea.  No constipation. Genitourinary: Negative for dysuria. Musculoskeletal: Negative for neck pain.  Negative for back pain. Integumentary: Positive for rash. Neurological: Negative for headaches, focal weakness or numbness.   ____________________________________________   PHYSICAL EXAM:  VITAL SIGNS: ED Triage Vitals  Enc Vitals Group     BP 02/08/17 2017 106/65     Pulse Rate 02/08/17 2017 (!) 59     Resp 02/08/17 2017 18     Temp 02/08/17 2017 97.7 F (36.5 C)     Temp Source  02/08/17 2017 Oral     SpO2 02/08/17 2017 95 %     Weight 02/08/17 2017 104.3 kg (230 lb)     Height --      Head Circumference --      Peak Flow --      Pain Score 02/09/17 0053 0     Pain Loc --      Pain Edu? --      Excl. in [place]? --     Constitutional: Alert and oriented. Well appearing and in no acute distress. Eyes: Conjunctivae are normal.  Head: Atraumatic. Mouth/Throat: Mucous membranes are moist.  Oropharynx non-erythematous. Neck: No stridor. Cardiovascular: Normal rate, regular rhythm. Good peripheral circulation. Grossly normal heart sounds. Respiratory: Normal respiratory effort.  No retractions. Lungs CTAB. Gastrointestinal: Soft and nontender. No distention.   Musculoskeletal: No lower extremity tenderness nor edema. No gross deformities of extremities. Neurologic:  Normal speech and language. No  gross focal neurologic deficits are appreciated.  Skin:  Generalized maculopapular rash with extensive excoriations consistent with scabies Psychiatric: Mood and affect are normal. Speech and behavior are normal.  ____________________________________________   LABS (all labs ordered are listed, but only abnormal results are displayed)  Labs Reviewed  COMPREHENSIVE METABOLIC PANEL - Abnormal; Notable for the following:       Result Value   Potassium 5.5 (*)    BUN 55 (*)    Creatinine, Ser 2.09 (*)    Calcium 8.7 (*)    ALT 9 (*)    GFR calc non Af Amer 33 (*)    GFR calc Af Amer 38 (*)    All other components within normal limits  CBC WITH DIFFERENTIAL/PLATELET - Abnormal; Notable for the following:    RBC 3.45 (*)    Hemoglobin 11.3 (*)    HCT 33.3 (*)    Platelets 138 (*)    Monocytes Absolute 1.1 (*)    All other components within normal limits  TROPONIN I  CBC WITH DIFFERENTIAL/PLATELET   ____________________________________________  EKG  ED ECG REPORT I, [place] N Shelagh Rayman, the attending physician, personally viewed and interpreted this ECG.   Date:  02/09/2017  EKG Time: 12:39 AM  Rate: 60  Rhythm: Normal sinus rhythm  Axis: Normal  Intervals: Normal  ST&T Change: None  ____________________________________________  RADIOLOGY I, [place] N Jorian [place], personally viewed and evaluated these images (plain radiographs) as part of my medical decision making, as well as reviewing the written report by the radiologist.  Ct Head Wo Contrast  Result Date: 02/09/2017 CLINICAL DATA:  61 year old male with fall and head injury. EXAM: CT HEAD WITHOUT CONTRAST TECHNIQUE: Contiguous axial images were obtained from the base of the skull through the vertex without intravenous contrast. COMPARISON:  Head CT dated 11/02/2012 FINDINGS: Brain: There is mild age-related atrophy and chronic microvascular ischemic changes. No acute intracranial hemorrhage. No mass effect or midline shift. No extra-axial fluid collection. Vascular: No hyperdense vessel or unexpected calcification. Skull: Normal. Negative for fracture or focal lesion. Sinuses/Orbits: No acute finding. Other: None IMPRESSION: 1. No acute intracranial hemorrhage. 2. Mild age-related atrophy chronic microvascular ischemic changes. Electronically Signed   By: Anner Crete M.D.   On: 02/09/2017 01:33     Procedures   ____________________________________________   INITIAL IMPRESSION / ASSESSMENT AND PLAN / ED COURSE  Pertinent labs & imaging results that were available during my care of the patient were reviewed by me and considered in my medical decision making (see chart for details).  Patient given Atarax and permethrin in the emergency department. Advised to follow-up with primary care provider for further outpatient evaluation and management      ____________________________________________  FINAL CLINICAL IMPRESSION(S) / ED DIAGNOSES  Final diagnoses:  Injury of head, initial encounter  Scabies     MEDICATIONS GIVEN DURING THIS VISIT:  Medications  permethrin (ELIMITE) 5 %  cream ( Topical Given 02/09/17 0300)  hydrOXYzine (ATARAX/VISTARIL) tablet 50 mg (50 mg Oral Given 02/09/17 0143)     NEW OUTPATIENT MEDICATIONS STARTED DURING THIS VISIT:  Discharge Medication List as of 02/09/2017  2:08 AM    START taking these medications   Details  hydrOXYzine (ATARAX/VISTARIL) 10 MG tablet Take 1 tablet (10 mg total) by mouth 3 (three) times daily as needed., Starting Thu 02/09/2017, Print        Discharge Medication List as of 02/09/2017  2:08 AM      Discharge Medication List as of 02/09/2017  2:08 AM       Note:  This document was prepared using Dragon voice recognition software and may include unintentional dictation errors.    Gregor Hams, MD 02/09/17 [phone]

## 2017-02-09 NOTE — ED Notes (Signed)
Lab called to inform need for lavender tube recollect d/t specimen clotting. Will recollect and send sample once pt returns to room from CT.

## 2017-02-10 ENCOUNTER — Other Ambulatory Visit: Payer: Self-pay | Admitting: Psychiatry

## 2017-02-10 DIAGNOSIS — F840 Autistic disorder: Secondary | ICD-10-CM

## 2017-02-10 DIAGNOSIS — F259 Schizoaffective disorder, unspecified: Secondary | ICD-10-CM

## 2017-02-10 MED ORDER — LORAZEPAM 0.5 MG PO TABS: 0.5000 mg | ORAL_TABLET | Freq: Three times a day (TID) | ORAL | 5 refills | Status: DC

## 2017-02-10 MED ORDER — GABAPENTIN 100 MG PO CAPS: 100.0000 mg | ORAL_CAPSULE | Freq: Three times a day (TID) | ORAL | 10 refills | Status: DC

## 2017-02-10 MED ORDER — DIVALPROEX SODIUM ER 500 MG PO TB24: ORAL_TABLET | ORAL | 10 refills | Status: DC

## 2017-02-10 MED ORDER — LAMOTRIGINE 100 MG PO TABS: 100.0000 mg | ORAL_TABLET | Freq: Every day | ORAL | 10 refills | Status: DC

## 2017-02-10 MED ORDER — LURASIDONE HCL 40 MG PO TABS: 40.0000 mg | ORAL_TABLET | Freq: Every day | ORAL | 10 refills | Status: DC

## 2017-02-10 MED ORDER — QUETIAPINE FUMARATE 100 MG PO TABS: ORAL_TABLET | ORAL | 10 refills | Status: DC

## 2017-02-10 MED ORDER — SERTRALINE HCL 100 MG PO TABS: ORAL_TABLET | ORAL | 10 refills | Status: DC

## 2017-02-10 MED ORDER — QUETIAPINE FUMARATE 400 MG PO TABS: 400.0000 mg | ORAL_TABLET | Freq: Every day | ORAL | 10 refills | Status: DC

## 2017-03-09 ENCOUNTER — Emergency Department
Admission: EM | Admit: 2017-03-09 | Discharge: 2017-03-10 | Disposition: A | Discharge status: Home / Self Care | Payer: Medicare Other | Attending: Emergency Medicine | Admitting: Emergency Medicine

## 2017-03-09 ENCOUNTER — Encounter: Payer: Self-pay | Admitting: Emergency Medicine

## 2017-03-09 DIAGNOSIS — F99 Mental disorder, not otherwise specified: Secondary | ICD-10-CM | POA: Insufficient documentation

## 2017-03-09 DIAGNOSIS — F250 Schizoaffective disorder, bipolar type: Secondary | ICD-10-CM | POA: Diagnosis present

## 2017-03-09 DIAGNOSIS — E119 Type 2 diabetes mellitus without complications: Secondary | ICD-10-CM | POA: Insufficient documentation

## 2017-03-09 DIAGNOSIS — R451 Restlessness and agitation: Secondary | ICD-10-CM | POA: Diagnosis not present

## 2017-03-09 DIAGNOSIS — F840 Autistic disorder: Secondary | ICD-10-CM | POA: Diagnosis not present

## 2017-03-09 DIAGNOSIS — R45851 Suicidal ideations: Secondary | ICD-10-CM | POA: Diagnosis not present

## 2017-03-09 DIAGNOSIS — I10 Essential (primary) hypertension: Secondary | ICD-10-CM | POA: Insufficient documentation

## 2017-03-09 LAB — URINE DRUG SCREEN, QUALITATIVE (ARMC ONLY)
AMPHETAMINES, UR SCREEN: NOT DETECTED
Barbiturates, Ur Screen: NOT DETECTED
Benzodiazepine, Ur Scrn: POSITIVE — AB
Cannabinoid 50 Ng, Ur ~~LOC~~: NOT DETECTED
Cocaine Metabolite,Ur ~~LOC~~: NOT DETECTED
MDMA (ECSTASY) UR SCREEN: NOT DETECTED
METHADONE SCREEN, URINE: NOT DETECTED
Opiate, Ur Screen: NOT DETECTED
PHENCYCLIDINE (PCP) UR S: NOT DETECTED
Tricyclic, Ur Screen: POSITIVE — AB

## 2017-03-09 LAB — CBC
HCT: 36.6 % — ABNORMAL LOW (ref 40.0–52.0)
Hemoglobin: 12.5 g/dL — ABNORMAL LOW (ref 13.0–18.0)
MCH: 33.3 pg (ref 26.0–34.0)
MCHC: 34.1 g/dL (ref 32.0–36.0)
MCV: 97.5 fL (ref 80.0–100.0)
PLATELETS: 135 10*3/uL — AB (ref 150–440)
RBC: 3.75 MIL/uL — AB (ref 4.40–5.90)
RDW: 13.7 % (ref 11.5–14.5)
WBC: 5.6 10*3/uL (ref 3.8–10.6)

## 2017-03-09 LAB — COMPREHENSIVE METABOLIC PANEL
ALBUMIN: 4 g/dL (ref 3.5–5.0)
ALK PHOS: 53 U/L (ref 38–126)
ALT: 10 U/L — AB (ref 17–63)
AST: 31 U/L (ref 15–41)
Anion gap: 11 (ref 5–15)
BUN: 36 mg/dL — AB (ref 6–20)
CALCIUM: 9.1 mg/dL (ref 8.9–10.3)
CO2: 26 mmol/L (ref 22–32)
CREATININE: 1.9 mg/dL — AB (ref 0.61–1.24)
Chloride: 100 mmol/L — ABNORMAL LOW (ref 101–111)
GFR calc Af Amer: 42 mL/min — ABNORMAL LOW (ref >60)
GFR calc non Af Amer: 36 mL/min — ABNORMAL LOW (ref >60)
GLUCOSE: 126 mg/dL — AB (ref 65–99)
Potassium: 4.3 mmol/L (ref 3.5–5.1)
SODIUM: 137 mmol/L (ref 135–145)
Total Bilirubin: 0.6 mg/dL (ref 0.3–1.2)
Total Protein: 7.2 g/dL (ref 6.5–8.1)

## 2017-03-09 LAB — ETHANOL: Alcohol, Ethyl (B): <5 mg/dL (ref <5)

## 2017-03-09 LAB — GLUCOSE, CAPILLARY: Glucose-Capillary: 154 mg/dL — ABNORMAL HIGH (ref 65–99)

## 2017-03-09 LAB — ACETAMINOPHEN LEVEL: Acetaminophen (Tylenol), Serum: <10 ug/mL — ABNORMAL LOW (ref 10–30)

## 2017-03-09 LAB — SALICYLATE LEVEL: Salicylate Lvl: <7 mg/dL (ref 2.8–30.0)

## 2017-03-09 MED ORDER — DIVALPROEX SODIUM ER 500 MG PO TB24
2000.0000 mg | ORAL_TABLET | Freq: Every day | ORAL | Status: DC
  Administered 2017-03-09: 2000 mg via ORAL
  Filled 2017-03-09: qty 8
  Filled 2017-03-09: qty 4

## 2017-03-09 MED ORDER — LINAGLIPTIN 5 MG PO TABS
5.0000 mg | ORAL_TABLET | Freq: Every day | ORAL | Status: DC
  Administered 2017-03-10: 5 mg via ORAL
  Filled 2017-03-09: qty 1

## 2017-03-09 MED ORDER — SIMVASTATIN 10 MG PO TABS
20.0000 mg | ORAL_TABLET | Freq: Every day | ORAL | Status: DC
  Administered 2017-03-10: 20 mg via ORAL
  Filled 2017-03-09: qty 2

## 2017-03-09 MED ORDER — INSULIN GLARGINE 100 UNIT/ML ~~LOC~~ SOLN
12.0000 [IU] | Freq: Every day | SUBCUTANEOUS | Status: DC
  Administered 2017-03-09: 12 [IU] via SUBCUTANEOUS
  Filled 2017-03-09 (×3): qty 0.12

## 2017-03-09 MED ORDER — SERTRALINE HCL 50 MG PO TABS
200.0000 mg | ORAL_TABLET | Freq: Every day | ORAL | Status: DC
  Administered 2017-03-10: 200 mg via ORAL
  Filled 2017-03-09: qty 4

## 2017-03-09 MED ORDER — LURASIDONE HCL 40 MG PO TABS
40.0000 mg | ORAL_TABLET | Freq: Every day | ORAL | Status: DC
  Administered 2017-03-10: 40 mg via ORAL
  Filled 2017-03-09 (×2): qty 1

## 2017-03-09 MED ORDER — TAMSULOSIN HCL 0.4 MG PO CAPS
0.4000 mg | ORAL_CAPSULE | Freq: Every day | ORAL | Status: DC
  Administered 2017-03-10: 0.4 mg via ORAL
  Filled 2017-03-09: qty 1

## 2017-03-09 MED ORDER — PANTOPRAZOLE SODIUM 40 MG PO TBEC
40.0000 mg | DELAYED_RELEASE_TABLET | Freq: Every day | ORAL | Status: DC
  Administered 2017-03-10: 40 mg via ORAL
  Filled 2017-03-09: qty 1

## 2017-03-09 MED ORDER — QUETIAPINE FUMARATE 200 MG PO TABS
400.0000 mg | ORAL_TABLET | Freq: Every day | ORAL | Status: DC
  Administered 2017-03-09: 400 mg via ORAL
  Filled 2017-03-09: qty 2

## 2017-03-09 MED ORDER — LAMOTRIGINE 100 MG PO TABS
100.0000 mg | ORAL_TABLET | Freq: Every day | ORAL | Status: DC
  Administered 2017-03-09: 100 mg via ORAL
  Filled 2017-03-09: qty 1

## 2017-03-09 MED ORDER — GABAPENTIN 100 MG PO CAPS
100.0000 mg | ORAL_CAPSULE | Freq: Three times a day (TID) | ORAL | Status: DC
  Administered 2017-03-09 – 2017-03-10 (×3): 100 mg via ORAL
  Filled 2017-03-09 (×3): qty 1

## 2017-03-09 MED ORDER — LORAZEPAM 0.5 MG PO TABS
0.5000 mg | ORAL_TABLET | Freq: Three times a day (TID) | ORAL | Status: DC
  Administered 2017-03-09 – 2017-03-10 (×3): 0.5 mg via ORAL
  Filled 2017-03-09 (×3): qty 1

## 2017-03-09 MED ORDER — QUETIAPINE FUMARATE 25 MG PO TABS
100.0000 mg | ORAL_TABLET | Freq: Three times a day (TID) | ORAL | Status: DC
  Administered 2017-03-10 (×2): 100 mg via ORAL
  Filled 2017-03-09 (×2): qty 4

## 2017-03-09 MED ORDER — MELOXICAM 7.5 MG PO TABS
15.0000 mg | ORAL_TABLET | Freq: Every day | ORAL | Status: DC
  Administered 2017-03-10: 15 mg via ORAL
  Filled 2017-03-09: qty 2
  Filled 2017-03-09: qty 1

## 2017-03-09 MED ORDER — DOCUSATE SODIUM 100 MG PO CAPS
100.0000 mg | ORAL_CAPSULE | Freq: Two times a day (BID) | ORAL | Status: DC
  Administered 2017-03-10: 100 mg via ORAL
  Filled 2017-03-09 (×3): qty 1

## 2017-03-09 MED ORDER — LORATADINE 10 MG PO TABS
10.0000 mg | ORAL_TABLET | Freq: Every day | ORAL | Status: DC
  Administered 2017-03-10: 10 mg via ORAL
  Filled 2017-03-09: qty 1

## 2017-03-09 MED ORDER — LISINOPRIL 5 MG PO TABS
5.0000 mg | ORAL_TABLET | Freq: Every day | ORAL | Status: DC
  Administered 2017-03-10: 5 mg via ORAL
  Filled 2017-03-09: qty 1

## 2017-03-09 NOTE — ED Notes (Signed)
Snack and beverage given. 

## 2017-03-09 NOTE — ED Triage Notes (Addendum)
Pt comes into the ED via POV c/o suicidal ideation.  Patient is tearful in the triage room at this time and states I have "a lot of things going on in my head".  Patient denies any HI at this time.  Patient is upset that he lives in a group home and feels like he cant have any independence or friends while being there.

## 2017-03-09 NOTE — ED Provider Notes (Signed)
[facility] Emergency Department Provider Note  ____________________________________________  Time seen: Approximately 9:30 PM  I have reviewed the triage vital signs and the nursing notes.   HISTORY  Chief Complaint Suicidal    HPI ZAYVIAN MCMURTRY is a 61 y.o. male with a history of schizoaffective disorder and autism, diabetes, HTN, presenting for suicidal ideations per the patient states that he always has suicidal ideations, and that he is occasionally has homicidal ideations "but they're not serious." He reports significant stress at his group home. He does not have any medical complaints at this time.   Past Medical History:  Diagnosis Date  . Anxiety   . Anxiety disorder due to known physiological condition   . Arthritis   . Autistic disorder, residual state   . Depression   . Developmental disorder   . Diabetes mellitus without complication ([place])   . Dyslipidemia   . Esophageal reflux   . Hypertension   . Obesity   . Overactive bladder   . Schizophrenia, schizoaffective ([place])   . Sleep apnea   . Urinary incontinence     Patient Active Problem List   Diagnosis Date Noted  . Diabetes ([place]) 04/10/2015  . Hypertension 04/10/2015  . Prostate hypertrophy 04/10/2015  . Schizoaffective disorder ([place]) 04/10/2015  . Enlarged prostate 04/10/2015  . Essential (primary) hypertension 04/10/2015  . Type 2 diabetes mellitus ([place]) 04/10/2015  . Controlled type 2 diabetes mellitus without complication ([place]) 16/60/6301  . Schizoaffective disorder, bipolar type ([place]) 12/29/2014  . Active autistic disorder 12/29/2014    Past Surgical History:  Procedure Laterality Date  . COLONOSCOPY  01/28/2005  . COLONOSCOPY WITH PROPOFOL N/A 05/09/2016   Procedure: COLONOSCOPY WITH PROPOFOL;  Surgeon: Manya Silvas, MD;  Location: [facility] ENDOSCOPY;  Service: Endoscopy;  Laterality: N/A;  . ESOPHAGOGASTRODUODENOSCOPY N/A 05/09/2016   Procedure:  ESOPHAGOGASTRODUODENOSCOPY (EGD);  Surgeon: Manya Silvas, MD;  Location: [facility] ENDOSCOPY;  Service: Endoscopy;  Laterality: N/A;  . TONSILLECTOMY      Current Outpatient Rx  . Order #: [id number] Class: Historical Med  . Order #: [id number] Class: Normal  . Order #: [id number] Class: Historical Med  . Order #: [id number] Class: Historical Med  . Order #: [id number] Class: Normal  . Order #: [id number] Class: Print  . Order #: [id number] Class: Print  . Order #: [id number] Class: Normal  . Order #: [id number] Class: Historical Med  . Order #: [id number] Class: Print  . Order #: [id number] Class: Phone In  . Order #: [id number] Class: Normal  . Order #: [id number] Class: Historical Med  . Order #: [id number] Class: Historical Med  . Order #: [id number] Class: Normal  . Order #: [id number] Class: Historical Med  . Order #: [id number] Class: Normal  . Order #: [id number] Class: Normal  . Order #: [id number] Class: Normal  . Order #: [id number] Class: Historical Med  . Order #: [id number] Class: Historical Med  . Order #: [id number] Class: Historical Med  . Order #: [id number] Class: Historical Med    Allergies Cortizone-10  [hydrocortisone]; Other; and Penicillins  Family History  Problem Relation Age of Onset  . Family history unknown: Yes    Social History Social History  Substance Use Topics  . Smoking status: Never Smoker  . Smokeless tobacco: Never Used  . Alcohol use No    Review of Systems Constitutional: No fever/chills. Eyes: No visual changes. ENT: No sore throat. No congestion or rhinorrhea. Cardiovascular: Denies chest pain. Denies palpitations. Respiratory: Denies shortness of breath.  No cough. Gastrointestinal: No abdominal pain.  No nausea, no vomiting.  No diarrhea.  No constipation.  Genitourinary: Negative for dysuria. Musculoskeletal: Negative for back pain. Skin: Negative for rash. Neurological: Negative for headaches. No focal numbness, tingling or weakness.  Psychiatric:Positive  suicidal ideation. Positive homicidal ideation. No hallucinations.  ____________________________________________   PHYSICAL EXAM:  VITAL SIGNS: ED Triage Vitals [03/09/17 1904]  Enc Vitals Group     BP (!) 114/54     Pulse Rate (!) 59     Resp (!) 22     Temp 98.4 F (36.9 C)     Temp Source Oral     SpO2 100 %     Weight      Height      Head Circumference      Peak Flow      Pain Score      Pain Loc      Pain Edu?      Excl. in [place]?     Constitutional: Alert and oriented. Well appearing and in no acute distress. Answers questions appropriately. Eyes: Conjunctivae are normal.  EOMI. No scleral icterus. Head: Atraumatic. Nose: No congestion/rhinnorhea. Mouth/Throat: Mucous membranes are moist.  Neck: No stridor.  Supple.   Cardiovascular: Normal rate, regular rhythm. No murmurs, rubs or gallops.  Respiratory: Normal respiratory effort.  No accessory muscle use or retractions. Lungs CTAB.  No wheezes, rales or ronchi. Musculoskeletal: No LE edema.  Neurologic:  A&Ox3.  Speech is clear.  Face and smile are symmetric.  EOMI.  Moves all extremities well. Skin:  Skin is warm, dry and intact. No rash noted. Psychiatric: The patient is shaky and does not make good eye contact. He does answer questions appropriate. On my examination, he does report chronic suicidality, and homicidal ideations "but they're not serious." He denies any hallucinations.  ____________________________________________   LABS (all labs ordered are listed, but only abnormal results are displayed)  Labs Reviewed  COMPREHENSIVE METABOLIC PANEL - Abnormal; Notable for the following:       Result Value   Chloride 100 (*)    Glucose, Bld 126 (*)    BUN 36 (*)    Creatinine, Ser 1.90 (*)    ALT 10 (*)    GFR calc non Af Amer 36 (*)    GFR calc Af Amer 42 (*)    All other components within normal limits  ACETAMINOPHEN LEVEL - Abnormal; Notable for the following:    Acetaminophen (Tylenol), Serum <10  (*)    All other components within normal limits  CBC - Abnormal; Notable for the following:    RBC 3.75 (*)    Hemoglobin 12.5 (*)    HCT 36.6 (*)    Platelets 135 (*)    All other components within normal limits  URINE DRUG SCREEN, QUALITATIVE ([facility] ONLY) - Abnormal; Notable for the following:    Tricyclic, Ur Screen POSITIVE (*)    Benzodiazepine, Ur Scrn POSITIVE (*)    All other components within normal limits  ETHANOL  SALICYLATE LEVEL   ____________________________________________  EKG  Not indicated ____________________________________________  RADIOLOGY  No results found.  ____________________________________________   PROCEDURES  Procedure(s) performed: None  Procedures  Critical Care performed: No ____________________________________________   INITIAL IMPRESSION / ASSESSMENT AND PLAN / ED COURSE  Pertinent labs & imaging results that were available during my care of the patient were reviewed by me and considered in my medical decision making (see chart for details).  61 y.o. male with a history of autism and schizoaffective disorder presenting for suicidal ideations and agitation at his group home. Overall,  the patient is hemodynamically stable and has no medical complaints. His laboratory studies show a chronic renal insufficiency that is unchanged, mild hyperglycemia without DKA, a chronic anemia that is unchanged, and his urine is positive for benzodiazepines and tricyclics. At this time, the patient is medically cleared for psychiatric disposition. He has been seen by Dr. Carlena Hurl, who is his outpatient physician and someone that he knows well.  He does not feel the patient can go home right now due to a complicated social situation. . Dr. Carlena Hurl will plan a final disposition tomorrow.  ____________________________________________  FINAL CLINICAL IMPRESSION(S) / ED DIAGNOSES  Final diagnoses:  Suicidal ideation  Agitation         NEW  MEDICATIONS STARTED DURING THIS VISIT:  New Prescriptions   No medications on file      Eula Listen, MD 03/09/17 2134

## 2017-03-09 NOTE — ED Notes (Signed)
Pt. Transferred to [place] from ED to room 8 after screening for contraband. Report to include Situation, Background, Assessment and Recommendations from [facility]. Pt. Oriented to unit including Q15 minute rounds as well as the security cameras for their protection. Patient is alert and oriented, warm and dry in no acute distress. Patient denies SI, HI, and AVH. Pt. Encouraged to let me know if needs arise.

## 2017-03-09 NOTE — ED Notes (Signed)
Pt belonging bag: shirt, pants, pair of shoes, wallet no cash, comb, 1 pencil, 4 sharpies, multiple towellette packs, flashlight, suspenders. Pt Belong Bag passed to [employer].

## 2017-03-09 NOTE — ED Notes (Signed)
Hourly rounding reveals patient in room. No complaints, stable, in no acute distress. Q15 minute rounds and monitoring via Security Cameras to continue. 

## 2017-03-09 NOTE — Consult Note (Signed)
[facility] Consult   Reason for Consult:  Consult for 61 year old man with a history of developmental disability and chronic mental health problems who has had a crisis this evening Referring Physician:  Mariea Clonts Patient Identification: Alexander Beard MRN:  [id number] Principal Diagnosis: Active autistic disorder Diagnosis:   Patient Active Problem List   Diagnosis Date Noted  . Diabetes ([place]) [E11.9] 04/10/2015  . Hypertension [I10] 04/10/2015  . Prostate hypertrophy [N40.0] 04/10/2015  . Schizoaffective disorder ([place]) [F25.9] 04/10/2015  . Enlarged prostate [N40.0] 04/10/2015  . Essential (primary) hypertension [I10] 04/10/2015  . Type 2 diabetes mellitus ([place]) [E11.9] 04/10/2015  . Controlled type 2 diabetes mellitus without complication ([place]) [O70.7] 04/10/2015  . Schizoaffective disorder, bipolar type ([place]) [F25.0] 12/29/2014  . Active autistic disorder [F84.0] 12/29/2014    Total Time spent with patient: 1 hour  Subjective:   Alexander Beard is a 61 y.o. male patient admitted with "I think we both got mad at each other".  HPI:  Patient interviewed chart reviewed. 61 year old man with a history of chronic mental health problems. Patient is very well known to me from outpatient practice and multiple encounters. I spoke with the patient's brother, Alexander Beard, as well tonight to get collateral information. Apparently Alexander Beard was preparing to go to some kind of event at his church this evening and was not getting dressed as quickly as the rest of the group home would've preferred. Some kind of comments were made and it set [name] off. He perceives it as he and the manager of the group home got mad at each other although what is clear is that ultimately he lost control and tried to run away. He was emotionally overwrought. Didn't do anything to hurt himself or hurt anyone else. He was brought to the emergency room for stabilization. Other than the acute event tonight the patient  doesn't have any other new complaints. He has his usual chronic list of complaints about not being able to walk around town on his own. Nothing different about this.  Social history: Patient's brother Alexander Beard is his guardian and is closely involved in the patient's care. I have already spoken to his brother this evening. The patient lives in a group home that has been an extraordinarily good fit for him.  Medical history: Diabetes requiring oral and insulin medication. Hypertension. Dyslipidemia.  Substance abuse history: None  Past Psychiatric History: Patient has a lifelong history of disability. I have known him for several years. When I first started seeing him and even until this day he carries a diagnosis of schizophrenia or schizoaffective disorder on his chart and it does appear that medications are helpful for him, but I think the underlying problem is more like autistic spectrum disorder. Patient is rarely if ever truly psychotic and instead is very emotionally labile and underdeveloped. He does not have a history of actual suicide attempts. Does not have a history of serious violence. There may have been a couple times in the past when he has laid a hand on someone but nothing terrible. He has a history of hospitalizations which at this point are probably of minimal benefit.  Risk to Self: Is patient at risk for suicide?: Yes Risk to Others:   Prior Inpatient Therapy:   Prior Outpatient Therapy:    Past Medical History:  Past Medical History:  Diagnosis Date  . Anxiety   . Anxiety disorder due to known physiological condition   . Arthritis   . Autistic disorder, residual state   .  Depression   . Developmental disorder   . Diabetes mellitus without complication ([place])   . Dyslipidemia   . Esophageal reflux   . Hypertension   . Obesity   . Overactive bladder   . Schizophrenia, schizoaffective ([place])   . Sleep apnea   . Urinary incontinence     Past Surgical History:  Procedure  Laterality Date  . COLONOSCOPY  01/28/2005  . COLONOSCOPY WITH PROPOFOL N/A 05/09/2016   Procedure: COLONOSCOPY WITH PROPOFOL;  Surgeon: Manya Silvas, MD;  Location: [facility] ENDOSCOPY;  Service: Endoscopy;  Laterality: N/A;  . ESOPHAGOGASTRODUODENOSCOPY N/A 05/09/2016   Procedure: ESOPHAGOGASTRODUODENOSCOPY (EGD);  Surgeon: Manya Silvas, MD;  Location: [facility] ENDOSCOPY;  Service: Endoscopy;  Laterality: N/A;  . TONSILLECTOMY     Family History:  Family History  Problem Relation Age of Onset  . Family history unknown: Yes   Family Psychiatric  History: None Social History:  History  Alcohol Use No     History  Drug Use No    Social History   Social History  . Marital status: Single    Spouse name: N/A  . Number of children: N/A  . Years of education: N/A   Social History Main Topics  . Smoking status: Never Smoker  . Smokeless tobacco: Never Used  . Alcohol use No  . Drug use: No  . Sexual activity: No   Other Topics Concern  . None   Social History Narrative   The patient never finished high school but did get his GED. 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 He is currently in disability and [name] is his legal guardian. He has been living in group homes for many years.      No pending legal charges   Additional Social History:    Allergies:   Allergies  Allergen Reactions  . Cortizone-10  [Hydrocortisone] Other (See Comments)  . Other Other (See Comments)    MYACINS  . Penicillins Nausea And Vomiting    Labs:  Results for orders placed or performed during the hospital encounter of 03/09/17 (from the past 48 hour(s))  Comprehensive metabolic panel     Status: Abnormal   Collection Time: 03/09/17  7:04 PM  Result Value Ref Range   Sodium 137 135 - 145 mmol/L   Potassium 4.3 3.5 - 5.1 mmol/L   Chloride 100 (L) 101 - 111 mmol/L   CO2 26 22 - 32 mmol/L   Glucose, Bld 126 (H) 65 -  99 mg/dL   BUN 36 (H) 6 - 20 mg/dL   Creatinine, Ser 1.90 (H) 0.61 - 1.24 mg/dL   Calcium 9.1 8.9 - 10.3 mg/dL   Total Protein 7.2 6.5 - 8.1 g/dL   Albumin 4.0 3.5 - 5.0 g/dL   AST 31 15 - 41 U/L   ALT 10 (L) 17 - 63 U/L   Alkaline Phosphatase 53 38 - 126 U/L   Total Bilirubin 0.6 0.3 - 1.2 mg/dL   GFR calc non Af Amer 36 (L) >60 mL/min   GFR calc Af Amer 42 (L) >60 mL/min    Comment: (NOTE) The eGFR has been calculated using the CKD EPI equation. This calculation has not been validated in all clinical situations. eGFR's persistently <60 mL/min signify possible Chronic Kidney Disease.    Anion gap 11 5 - 15  Ethanol     Status: None   Collection Time: 03/09/17  7:04  PM  Result Value Ref Range   Alcohol, Ethyl (B) <5 <5 mg/dL    Comment:        LOWEST DETECTABLE LIMIT FOR SERUM ALCOHOL IS 5 mg/dL FOR MEDICAL PURPOSES ONLY   Salicylate level     Status: None   Collection Time: 03/09/17  7:04 PM  Result Value Ref Range   Salicylate Lvl <2.7 2.8 - 30.0 mg/dL  Acetaminophen level     Status: Abnormal   Collection Time: 03/09/17  7:04 PM  Result Value Ref Range   Acetaminophen (Tylenol), Serum <10 (L) 10 - 30 ug/mL    Comment:        THERAPEUTIC CONCENTRATIONS VARY SIGNIFICANTLY. A RANGE OF 10-30 ug/mL MAY BE AN EFFECTIVE CONCENTRATION FOR MANY PATIENTS. HOWEVER, SOME ARE BEST TREATED AT CONCENTRATIONS OUTSIDE THIS RANGE. ACETAMINOPHEN CONCENTRATIONS >150 ug/mL AT 4 HOURS AFTER INGESTION AND >50 ug/mL AT 12 HOURS AFTER INGESTION ARE OFTEN ASSOCIATED WITH TOXIC REACTIONS.   cbc     Status: Abnormal   Collection Time: 03/09/17  7:04 PM  Result Value Ref Range   WBC 5.6 3.8 - 10.6 K/uL   RBC 3.75 (L) 4.40 - 5.90 MIL/uL   Hemoglobin 12.5 (L) 13.0 - 18.0 g/dL   HCT 36.6 (L) 40.0 - 52.0 %   MCV 97.5 80.0 - 100.0 fL   MCH 33.3 26.0 - 34.0 pg   MCHC 34.1 32.0 - 36.0 g/dL   RDW 13.7 11.5 - 14.5 %   Platelets 135 (L) 150 - 440 K/uL    Current Facility-Administered  Medications  Medication Dose Route Frequency Provider Last Rate Last Dose  . divalproex (DEPAKOTE ER) 24 hr tablet 2,000 mg  2,000 mg Oral QHS Neymar Dowe T, MD      . docusate sodium (COLACE) capsule 100 mg  100 mg Oral BID Marylu Dudenhoeffer T, MD      . gabapentin (NEURONTIN) capsule 100 mg  100 mg Oral TID Laureen Frederic T, MD      . insulin glargine (LANTUS) injection 12 Units  12 Units Subcutaneous QHS Brittnei Jagiello T, MD      . lamoTRIgine (LAMICTAL) tablet 100 mg  100 mg Oral QHS Eluterio Seymour T, MD      . linagliptin (TRADJENTA) tablet 5 mg  5 mg Oral Daily Sabin Gibeault T, MD      . lisinopril (PRINIVIL,ZESTRIL) tablet 5 mg  5 mg Oral Daily Nihaal Friesen T, MD      . loratadine (CLARITIN) tablet 10 mg  10 mg Oral Daily Coner Gibbard T, MD      . LORazepam (ATIVAN) tablet 0.5 mg  0.5 mg Oral TID Tamana Hatfield, [name], MD      . Derrill Memo ON 03/10/2017] lurasidone (LATUDA) tablet 40 mg  40 mg Oral Q breakfast Verline Kong T, MD      . meloxicam (MOBIC) tablet 15 mg  15 mg Oral Daily Benjermin Korber T, MD      . pantoprazole (PROTONIX) EC tablet 40 mg  40 mg Oral Daily Glen Blatchley T, MD      . QUEtiapine (SEROQUEL) tablet 100 mg  100 mg Oral TID Muhammed Teutsch T, MD      . QUEtiapine (SEROQUEL) tablet 400 mg  400 mg Oral QHS Binnie Droessler T, MD      . sertraline (ZOLOFT) tablet 200 mg  200 mg Oral Daily Deneene Tarver T, MD      . Derrill Memo ON 03/10/2017] simvastatin (ZOCOR) tablet 20 mg  20 mg Oral q1800 Jael Kostick, [name], MD      . Derrill Memo ON 03/10/2017] tamsulosin (FLOMAX) capsule 0.4 mg  0.4 mg Oral QPC breakfast Lysandra Loughmiller, [name], MD       Current Outpatient Prescriptions  Medication Sig Dispense Refill  . Cholecalciferol (VITAMIN D-1000 MAX ST) 1000 UNITS tablet Take 5,000 Units by mouth. Reported on 08/03/2015    . divalproex (DEPAKOTE ER) 500 MG 24 hr tablet TAKE 4 TABLETS (2000MG) BY MOUTH AT BEDTIME *DO NOT CRUSH* 120 tablet 10  . docusate sodium (COLACE) 100 MG capsule Take 100 mg by mouth 2 (two)  times daily.    . furosemide (LASIX) 20 MG tablet Take by mouth.    . gabapentin (NEURONTIN) 100 MG capsule Take 1 capsule (100 mg total) by mouth 3 (three) times daily. 90 capsule 10  . hydrOXYzine (ATARAX/VISTARIL) 10 MG tablet Take 1 tablet (10 mg total) by mouth 3 (three) times daily as needed. 15 tablet 0  . insulin glargine (LANTUS) 100 UNIT/ML injection Inject 0.12 mLs (12 Units total) into the skin at bedtime. 30 mL 0  . lamoTRIgine (LAMICTAL) 100 MG tablet Take 1 tablet (100 mg total) by mouth at bedtime. 30 tablet 10  . lisinopril (PRINIVIL,ZESTRIL) 5 MG tablet Take by mouth.    . loratadine (CLARITIN) 10 MG tablet Take 1 tablet (10 mg total) by mouth daily. 30 tablet 5  . LORazepam (ATIVAN) 0.5 MG tablet Take 1 tablet (0.5 mg total) by mouth 3 (three) times daily. 90 tablet 5  . lurasidone (LATUDA) 40 MG TABS tablet Take 1 tablet (40 mg total) by mouth daily. with food 30 tablet 10  . meloxicam (MOBIC) 15 MG tablet Take 15 mg by mouth daily.    . metoprolol succinate (TOPROL-XL) 50 MG 24 hr tablet Take by mouth.    . naproxen (NAPROSYN) 500 MG tablet TAKE ONE TABLET BY MOUTH 2 TIMES A DAY AS NEEDED FOR PAIN *TAKE WITH FOOD* 30 tablet 11  . pantoprazole (PROTONIX) 40 MG tablet Take 40 mg by mouth daily.    . QUEtiapine (SEROQUEL) 100 MG tablet TAKE ONE TABLET BY MOUTH 3 TIMES A DAY TAKE 1 TABLET BY MOUTH DAILY AS NEEDED FOR ANXIETY 120 tablet 10  . QUEtiapine (SEROQUEL) 400 MG tablet Take 1 tablet (400 mg total) by mouth at bedtime. 30 tablet 10  . sertraline (ZOLOFT) 100 MG tablet TAKE TWO TABLETS (200MG) BY MOUTH ONCE DAILY 60 tablet 10  . simvastatin (ZOCOR) 20 MG tablet Take 20 mg by mouth daily.    . sitaGLIPtin (JANUVIA) 100 MG tablet Take 100 mg by mouth daily.    . solifenacin (VESICARE) 5 MG tablet Take 5 mg by mouth daily.    . tamsulosin (FLOMAX) 0.4 MG CAPS capsule Take 0.4 mg by mouth daily.      Musculoskeletal: Strength & Muscle Tone: within normal limits Gait &  Station: normal Patient leans: N/A  Psychiatric Specialty Exam: Physical Exam  Nursing note and vitals reviewed. Constitutional: He appears well-developed and well-nourished.  HENT:  Head: Normocephalic and atraumatic.  Eyes: Pupils are equal, round, and reactive to light. Conjunctivae are normal.  Neck: Normal range of motion.  Cardiovascular: Regular rhythm and normal heart sounds.   Respiratory: Effort normal. No respiratory distress.  GI: Soft.  Musculoskeletal: Normal range of motion.  Neurological: He is alert.  Skin: Skin is warm and dry.  Psychiatric: His speech is normal and behavior is normal. His mood appears anxious. His affect  is blunt. Thought content is not paranoid. Cognition and memory are normal. He expresses impulsivity. He expresses no homicidal and no suicidal ideation.    Review of Systems  Constitutional: Negative.   HENT: Negative.   Eyes: Negative.   Respiratory: Negative.   Cardiovascular: Negative.   Gastrointestinal: Negative.   Musculoskeletal: Negative.   Skin: Negative.   Neurological: Negative.   Psychiatric/Behavioral: Negative for depression, hallucinations, memory loss, substance abuse and suicidal ideas. The patient is nervous/anxious. The patient does not have insomnia.     Blood pressure (!) 114/54, pulse (!) 59, temperature 98.4 F (36.9 C), temperature source Oral, resp. rate (!) 22, SpO2 100 %.There is no height or weight on file to calculate BMI.  General Appearance: Casual  Eye Contact:  Fair  Speech:  Normal Rate  Volume:  Increased  Mood:  Anxious and Dysphoric  Affect:  Congruent  Thought Process:  Goal Directed  Orientation:  Full (Time, Place, and Person)  Thought Content:  Rumination and Tangential  Suicidal Thoughts:  No  Homicidal Thoughts:  No  Memory:  Immediate;   Good Recent;   Fair Remote;   Fair  Judgement:  Fair  Insight:  Fair  Psychomotor Activity:  Tremor  Concentration:  Concentration: Fair  Recall:  [employer] of Knowledge:  Fair  Language:  Fair  Akathisia:  No  Handed:  Right  AIMS (if indicated):     Assets:  Communication Skills Desire for Improvement Financial Resources/Insurance Housing Resilience Social Support  ADL's:  Intact  Cognition:  Impaired,  Mild  Sleep:        Treatment Plan Summary: Daily contact with patient to assess and evaluate symptoms and progress in treatment, Medication management and Plan 61 year old man with chronic mental health issues had a bit of a crisis and a melt down at his group home today. By my assessment I think he is quickly coming back to his baseline. I do not think hospitalization will be beneficial. I do think it will be necessary for the patient to stay here in the emergency room overnight to let situation at the group home calm down and let the patient calm down before having him go back home to avoid another meltdown. I have spoken with his brother this evening who is in full agreement with the plan. I have restarted all of his medicines as usual. Case reviewed with the ER doctor and TTS. I am optimistic that tomorrow morning the patient can return to his group home and be reassured that his brother will pick him up Friday evening as previously scheduled.  Disposition: Patient does not meet criteria for psychiatric inpatient admission. Supportive therapy provided about ongoing stressors. Discussed crisis plan, support from social network, calling 911, coming to the Emergency Department, and calling Suicide Hotline.  Alethia Berthold, MD 03/09/2017 8:52 PM

## 2017-03-09 NOTE — ED Notes (Signed)
Hourly rounding reveals patient sleeping in room. No complaints, stable, in no acute distress. Q15 minute rounds and monitoring via [employer] Cam[employer] to continue. 

## 2017-03-10 DIAGNOSIS — F840 Autistic disorder: Secondary | ICD-10-CM | POA: Diagnosis not present

## 2017-03-10 DIAGNOSIS — R451 Restlessness and agitation: Secondary | ICD-10-CM | POA: Diagnosis not present

## 2017-03-10 NOTE — ED Notes (Signed)
Hourly rounding reveals patient sleeping in room. No complaints, stable, in no acute distress. Q15 minute rounds and monitoring via [employer] Cam[employer] to continue. 

## 2017-03-10 NOTE — Consult Note (Signed)
[facility] [place] [facility] Consult   Reason for Consult:  Consult for 61 year old man with a history of developmental disability and chronic mental health problems who has had a crisis this evening Referring Physician:  Mariea Clonts Patient Identification: Alexander Beard MRN:  [id number] Principal Diagnosis: Active autistic disorder Diagnosis:   Patient Active Problem List   Diagnosis Date Noted  . Diabetes ([place]) [E11.9] 04/10/2015  . Hypertension [I10] 04/10/2015  . Prostate hypertrophy [N40.0] 04/10/2015  . Schizoaffective disorder ([place]) [F25.9] 04/10/2015  . Enlarged prostate [N40.0] 04/10/2015  . Essential (primary) hypertension [I10] 04/10/2015  . Type 2 diabetes mellitus ([place]) [E11.9] 04/10/2015  . Controlled type 2 diabetes mellitus without complication ([place]) [K81.2] 04/10/2015  . Schizoaffective disorder, bipolar type ([place]) [F25.0] 12/29/2014  . Active autistic disorder [F84.0] 12/29/2014    Total Time spent with patient: 30 minutes  Subjective:   Alexander Beard is a 61 y.o. male patient admitted with "I think we both got mad at each other".  This is a follow-up for Friday the 40st. 61 year old man with schizoaffective disorder and autisticpectrum disorder.he has no new complaints today. Patient states that his mood is feeling okay. He is not reporting hallucinations. He has not been aggressive violent or threatening since coming to the emergency room. We spent some time talking about his grievances with which I am very familiar. Patient was ultimately agreeable to returning to his group home.  HPI:  Patient interviewed chart reviewed. 62 year old man with a history of chronic mental health problems. Patient is very well known to me from outpatient practice and multiple encounters. I spoke with the patient's brother, Alexander Beard, as well tonight to get collateral information. Apparently Alexander Beard was preparing to go to some kind of event at his church this evening and was not getting dressed as  quickly as the rest of the group home would've preferred. Some kind of comments were made and it set Alexander Beard off. He perceives it as he and the manager of the group home got mad at each other although what is clear is that ultimately he lost control and tried to run away. He was emotionally overwrought. Didn't do anything to hurt himself or hurt anyone else. He was brought to the emergency room for stabilization. Other than the acute event tonight the patient doesn't have any other new complaints. He has his usual chronic list of complaints about not being able to walk around town on his own. Nothing different about this.  Social history: Patient's brother Alexander Beard is his guardian and is closely involved in the patient's care. I have already spoken to his brother this evening. The patient lives in a group home that has been an extraordinarily good fit for him.  Medical history: Diabetes requiring oral and insulin medication. Hypertension. Dyslipidemia.  Substance abuse history: None  Past Psychiatric History: Patient has a lifelong history of disability. I have known him for several years. When I first started seeing him and even until this day he carries a diagnosis of schizophrenia or schizoaffective disorder on his chart and it does appear that medications are helpful for him, but I think the underlying problem is more like autistic spectrum disorder. Patient is rarely if ever truly psychotic and instead is very emotionally labile and underdeveloped. He does not have a history of actual suicide attempts. Does not have a history of serious violence. There may have been a couple times in the past when he has laid a hand on someone but nothing terrible. He has a  history of hospitalizations which at this point are probably of minimal benefit.  Risk to Self: Is patient at risk for suicide?: Yes Risk to Others:   Prior Inpatient Therapy:   Prior Outpatient Therapy:    Past Medical History:  Past Medical  History:  Diagnosis Date  . Anxiety   . Anxiety disorder due to known physiological condition   . Arthritis   . Autistic disorder, residual state   . Depression   . Developmental disorder   . Diabetes mellitus without complication ([place])   . Dyslipidemia   . Esophageal reflux   . Hypertension   . Obesity   . Overactive bladder   . Schizophrenia, schizoaffective ([place])   . Sleep apnea   . Urinary incontinence     Past Surgical History:  Procedure Laterality Date  . COLONOSCOPY  01/28/2005  . COLONOSCOPY WITH PROPOFOL N/A 05/09/2016   Procedure: COLONOSCOPY WITH PROPOFOL;  Surgeon: Manya Silvas, MD;  Location: [facility] ENDOSCOPY;  Service: Endoscopy;  Laterality: N/A;  . ESOPHAGOGASTRODUODENOSCOPY N/A 05/09/2016   Procedure: ESOPHAGOGASTRODUODENOSCOPY (EGD);  Surgeon: Manya Silvas, MD;  Location: [facility] ENDOSCOPY;  Service: Endoscopy;  Laterality: N/A;  . TONSILLECTOMY     Family History:  Family History  Problem Relation Age of Onset  . Family history unknown: Yes   Family Psychiatric  History: None Social History:  History  Alcohol Use No     History  Drug Use No    Social History   Social History  . Marital status: Single    Spouse name: N/A  . Number of children: N/A  . Years of education: N/A   Social History Main Topics  . Smoking status: Never Smoker  . Smokeless tobacco: Never Used  . Alcohol use No  . Drug use: No  . Sexual activity: No   Other Topics Concern  . None   Social History Narrative   The patient never finished high school but did get his GED. 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 He is currently in disability and his brother Alexander Beard is his legal guardian. He has been living in group homes for many years.      No pending legal charges   Additional Social History:    Allergies:   Allergies  Allergen Reactions  . Cortizone-10  [Hydrocortisone] Other (See Comments)  .  Other Other (See Comments)    MYACINS  . Penicillins Nausea And Vomiting    Labs:  Results for orders placed or performed during the hospital encounter of 03/09/17 (from the past 48 hour(s))  Comprehensive metabolic panel     Status: Abnormal   Collection Time: 03/09/17  7:04 PM  Result Value Ref Range   Sodium 137 135 - 145 mmol/L   Potassium 4.3 3.5 - 5.1 mmol/L   Chloride 100 (L) 101 - 111 mmol/L   CO2 26 22 - 32 mmol/L   Glucose, Bld 126 (H) 65 - 99 mg/dL   BUN 36 (H) 6 - 20 mg/dL   Creatinine, Ser 1.90 (H) 0.61 - 1.24 mg/dL   Calcium 9.1 8.9 - 10.3 mg/dL   Total Protein 7.2 6.5 - 8.1 g/dL   Albumin 4.0 3.5 - 5.0 g/dL   AST 31 15 - 41 U/L   ALT 10 (L) 17 - 63 U/L   Alkaline Phosphatase 53 38 - 126 U/L   Total Bilirubin 0.6 0.3 - 1.2 mg/dL   GFR  calc non Af Amer 36 (L) >60 mL/min   GFR calc Af Amer 42 (L) >60 mL/min    Comment: (NOTE) The eGFR has been calculated using the CKD EPI equation. This calculation has not been validated in all clinical situations. eGFR's persistently <60 mL/min signify possible Chronic Kidney Disease.    Anion gap 11 5 - 15  Ethanol     Status: None   Collection Time: 03/09/17  7:04 PM  Result Value Ref Range   Alcohol, Ethyl (B) <5 <5 mg/dL    Comment:        LOWEST DETECTABLE LIMIT FOR SERUM ALCOHOL IS 5 mg/dL FOR MEDICAL PURPOSES ONLY   Salicylate level     Status: None   Collection Time: 03/09/17  7:04 PM  Result Value Ref Range   Salicylate Lvl <1.8 2.8 - 30.0 mg/dL  Acetaminophen level     Status: Abnormal   Collection Time: 03/09/17  7:04 PM  Result Value Ref Range   Acetaminophen (Tylenol), Serum <10 (L) 10 - 30 ug/mL    Comment:        THERAPEUTIC CONCENTRATIONS VARY SIGNIFICANTLY. A RANGE OF 10-30 ug/mL MAY BE AN EFFECTIVE CONCENTRATION FOR MANY PATIENTS. HOWEVER, SOME ARE BEST TREATED AT CONCENTRATIONS OUTSIDE THIS RANGE. ACETAMINOPHEN CONCENTRATIONS >150 ug/mL AT 4 HOURS AFTER INGESTION AND >50 ug/mL AT 12 HOURS  AFTER INGESTION ARE OFTEN ASSOCIATED WITH TOXIC REACTIONS.   cbc     Status: Abnormal   Collection Time: 03/09/17  7:04 PM  Result Value Ref Range   WBC 5.6 3.8 - 10.6 K/uL   RBC 3.75 (L) 4.40 - 5.90 MIL/uL   Hemoglobin 12.5 (L) 13.0 - 18.0 g/dL   HCT 36.6 (L) 40.0 - 52.0 %   MCV 97.5 80.0 - 100.0 fL   MCH 33.3 26.0 - 34.0 pg   MCHC 34.1 32.0 - 36.0 g/dL   RDW 13.7 11.5 - 14.5 %   Platelets 135 (L) 150 - 440 K/uL  Urine Drug Screen, Qualitative     Status: Abnormal   Collection Time: 03/09/17  8:40 PM  Result Value Ref Range   Tricyclic, Ur Screen POSITIVE (A) NONE DETECTED   Amphetamines, Ur Screen NONE DETECTED NONE DETECTED   MDMA (Ecstasy)Ur Screen NONE DETECTED NONE DETECTED   Cocaine Metabolite,Ur [place] NONE DETECTED NONE DETECTED   Opiate, Ur Screen NONE DETECTED NONE DETECTED   Phencyclidine (PCP) Ur S NONE DETECTED NONE DETECTED   Cannabinoid 50 Ng, Ur [place] NONE DETECTED NONE DETECTED   Barbiturates, Ur Screen NONE DETECTED NONE DETECTED   Benzodiazepine, Ur Scrn POSITIVE (A) NONE DETECTED   Methadone Scn, Ur NONE DETECTED NONE DETECTED    Comment: (NOTE) 550  Tricyclics, urine               Cutoff 1000 ng/mL 200  Amphetamines, urine             Cutoff 1000 ng/mL 300  MDMA (Ecstasy), urine           Cutoff 500 ng/mL 400  Cocaine Metabolite, urine       Cutoff 300 ng/mL 500  Opiate, urine                   Cutoff 300 ng/mL 600  Phencyclidine (PCP), urine      Cutoff 25 ng/mL 700  Cannabinoid, urine              Cutoff 50 ng/mL 800  Barbiturates, urine  Cutoff 200 ng/mL 900  Benzodiazepine, urine           Cutoff 200 ng/mL 1000 Methadone, urine                Cutoff 300 ng/mL 1100 1200 The urine drug screen provides only a preliminary, unconfirmed 1300 analytical test result and should not be used for non-medical 1400 purposes. Clinical consideration and professional judgment should 1500 be applied to any positive drug screen result due to possible 1600  interfering substances. A more specific alternate chemical method 1700 must be used in order to obtain a confirmed analytical result.  1800 Gas chromato graphy / mass spectrometry (GC/MS) is the preferred 1900 confirmatory method.   Glucose, capillary     Status: Abnormal   Collection Time: 03/09/17 10:57 PM  Result Value Ref Range   Glucose-Capillary 154 (H) 65 - 99 mg/dL    Current Facility-Administered Medications  Medication Dose Route Frequency Provider Last Rate Last Dose  . divalproex (DEPAKOTE ER) 24 hr tablet 2,000 mg  2,000 mg Oral QHS [name], [name], MD   2,000 mg at 03/09/17 2236  . docusate sodium (COLACE) capsule 100 mg  100 mg Oral BID [name], [name], MD   100 mg at 03/10/17 5456  . gabapentin (NEURONTIN) capsule 100 mg  100 mg Oral TID [name], [name], MD   100 mg at 03/10/17 2563  . insulin glargine (LANTUS) injection 12 Units  12 Units Subcutaneous QHS [name], [name], MD   12 Units at 03/09/17 2315  . lamoTRIgine (LAMICTAL) tablet 100 mg  100 mg Oral QHS [name], [name], MD   100 mg at 03/09/17 2236  . linagliptin (TRADJENTA) tablet 5 mg  5 mg Oral Daily [name], [name], MD   5 mg at 03/10/17 8937  . lisinopril (PRINIVIL,ZESTRIL) tablet 5 mg  5 mg Oral Daily [name], [name], MD   5 mg at 03/10/17 3428  . loratadine (CLARITIN) tablet 10 mg  10 mg Oral Daily [name], [name], MD   10 mg at 03/10/17 7681  . LORazepam (ATIVAN) tablet 0.5 mg  0.5 mg Oral TID [name], [name], MD   0.5 mg at 03/10/17 0920  . lurasidone (LATUDA) tablet 40 mg  40 mg Oral Q breakfast [name], [name], MD   40 mg at 03/10/17 1572  . meloxicam (MOBIC) tablet 15 mg  15 mg Oral Daily [name], [name], MD   15 mg at 03/10/17 6203  . pantoprazole (PROTONIX) EC tablet 40 mg  40 mg Oral Daily [name], [name], MD   40 mg at 03/10/17 5597  . QUEtiapine (SEROQUEL) tablet 100 mg  100 mg Oral TID [name], [name], MD   100 mg at 03/10/17 0920  . QUEtiapine (SEROQUEL) tablet 400 mg  400 mg Oral QHS [name], [name], MD   400 mg at 03/09/17 2236  . sertraline (ZOLOFT) tablet 200 mg  200 mg Oral Daily [name], [name], MD   200 mg at 03/10/17 4163  . simvastatin (ZOCOR) tablet 20 mg  20 mg Oral q1800 [name], [name], MD      . tamsulosin (FLOMAX) capsule 0.4 mg  0.4 mg Oral QPC breakfast [name], [name], MD   0.4 mg at 03/10/17 8453   Current Outpatient Prescriptions  Medication Sig Dispense Refill  . Cholecalciferol (VITAMIN D-1000 MAX ST) 1000 UNITS tablet Take 5,000 Units by mouth. Reported on 08/03/2015    . divalproex (DEPAKOTE ER) 500  MG 24 hr tablet TAKE 4 TABLETS (2000MG) BY MOUTH AT BEDTIME *DO NOT CRUSH* 120 tablet 10  . docusate sodium (COLACE) 100 MG capsule Take 100 mg by mouth 2 (two) times daily.    . furosemide (LASIX) 20 MG tablet Take by mouth.    . gabapentin (NEURONTIN) 100 MG capsule Take 1 capsule (100 mg total) by mouth 3 (three) times daily. 90 capsule 10  . hydrOXYzine (ATARAX/VISTARIL) 10 MG tablet Take 1 tablet (10 mg total) by mouth 3 (three) times daily as needed. 15 tablet 0  . insulin glargine (LANTUS) 100 UNIT/ML injection Inject 0.12 mLs (12 Units total) into the skin at bedtime. 30 mL 0  . lamoTRIgine (LAMICTAL) 100 MG tablet Take 1 tablet (100 mg total) by mouth at bedtime. 30 tablet 10  . lisinopril (PRINIVIL,ZESTRIL) 5 MG tablet Take by mouth.    . loratadine (CLARITIN) 10 MG tablet Take 1 tablet (10 mg total) by mouth daily. 30 tablet 5  . LORazepam (ATIVAN) 0.5 MG tablet Take 1 tablet (0.5 mg total) by mouth 3 (three) times daily. 90 tablet 5  . lurasidone (LATUDA) 40 MG TABS tablet Take 1 tablet (40 mg total) by mouth daily. with food 30 tablet 10  . meloxicam (MOBIC) 15 MG tablet Take 15 mg by mouth daily.    . metoprolol succinate (TOPROL-XL) 50 MG 24 hr tablet Take by mouth.    . naproxen (NAPROSYN) 500 MG tablet TAKE ONE TABLET BY MOUTH 2 TIMES A DAY AS NEEDED FOR PAIN *TAKE WITH FOOD* 30 tablet 11  . pantoprazole (PROTONIX) 40 MG tablet Take 40 mg by mouth daily.     . QUEtiapine (SEROQUEL) 100 MG tablet TAKE ONE TABLET BY MOUTH 3 TIMES A DAY TAKE 1 TABLET BY MOUTH DAILY AS NEEDED FOR ANXIETY 120 tablet 10  . QUEtiapine (SEROQUEL) 400 MG tablet Take 1 tablet (400 mg total) by mouth at bedtime. 30 tablet 10  . sertraline (ZOLOFT) 100 MG tablet TAKE TWO TABLETS (200MG) BY MOUTH ONCE DAILY 60 tablet 10  . simvastatin (ZOCOR) 20 MG tablet Take 20 mg by mouth daily.    . sitaGLIPtin (JANUVIA) 100 MG tablet Take 100 mg by mouth daily.    . solifenacin (VESICARE) 5 MG tablet Take 5 mg by mouth daily.    . tamsulosin (FLOMAX) 0.4 MG CAPS capsule Take 0.4 mg by mouth daily.      Musculoskeletal: Strength & Muscle Tone: within normal limits Gait & Station: normal Patient leans: N/A  Psychiatric Specialty Exam: Physical Exam  Nursing note and vitals reviewed. Constitutional: He appears well-developed and well-nourished.  HENT:  Head: Normocephalic and atraumatic.  Eyes: Pupils are equal, round, and reactive to light. Conjunctivae are normal.  Neck: Normal range of motion.  Cardiovascular: Regular rhythm and normal heart sounds.   Respiratory: Effort normal. No respiratory distress.  GI: Soft.  Musculoskeletal: Normal range of motion.  Neurological: He is alert.  Skin: Skin is warm and dry.  Psychiatric: His speech is normal and behavior is normal. His mood appears anxious. His affect is blunt. Thought content is not paranoid. Cognition and memory are normal. He expresses impulsivity. He expresses no homicidal and no suicidal ideation.    Review of Systems  Constitutional: Negative.   HENT: Negative.   Eyes: Negative.   Respiratory: Negative.   Cardiovascular: Negative.   Gastrointestinal: Negative.   Musculoskeletal: Negative.   Skin: Negative.   Neurological: Negative.   Psychiatric/Behavioral: Negative for depression, hallucinations, memory loss,  substance abuse and suicidal ideas. The patient is nervous/anxious. The patient does not have  insomnia.     Blood pressure 129/76, pulse 88, temperature 97.7 F (36.5 C), temperature source Oral, resp. rate 18, SpO2 98 %.There is no height or weight on file to calculate BMI.  General Appearance: Casual  Eye Contact:  Fair  Speech:  Normal Rate  Volume:  Increased  Mood:  Anxious and Dysphoric  Affect:  Congruent  Thought Process:  Goal Directed  Orientation:  Full (Time, Place, and Person)  Thought Content:  Rumination and Tangential  Suicidal Thoughts:  No  Homicidal Thoughts:  No  Memory:  Immediate;   Good Recent;   Fair Remote;   Fair  Judgement:  Fair  Insight:  Fair  Psychomotor Activity:  Tremor  Concentration:  Concentration: Fair  Recall:  [employer] of Knowledge:  Fair  Language:  Fair  Akathisia:  No  Handed:  Right  AIMS (if indicated):     Assets:  Communication Skills Desire for Improvement Financial Resources/Insurance Housing Resilience Social Support  ADL's:  Intact  Cognition:  Impaired,  Mild  Sleep:        Treatment Plan Summary: Daily contact with patient to assess and evaluate symptoms and progress in treatment, Medication management and Plan no change to medication. Patient had a crisis which has subsided. I spoke with the manager of his group home today who is agreeable to coming and taking him up. Patient will be discharged back to the group home with no change in medicine. Follow-up with meas an outpatient as he has previously.  Disposition: Patient does not meet criteria for psychiatric inpatient admission. Supportive therapy provided about ongoing stressors. Discussed crisis plan, support from social network, calling 911, coming to the Emergency Department, and calling Suicide Hotline.  Alethia Berthold, MD 03/10/2017 2:23 PM

## 2017-03-10 NOTE — ED Provider Notes (Signed)
-----------------------------------------   5:27 PM on 03/10/2017 -----------------------------------------   Blood pressure 129/73, pulse 63, temperature 97.7 F (36.5 C), temperature source Oral, resp. rate 16, SpO2 100 %.  The patient had no acute events since last update.  Calm and cooperative at this time.  Disposition is pending Psychiatry/Behavioral Medicine team recommendations.  She has been seen by Dr. Lissa Hoard packs and will be discharged.   Nena Polio, MD 03/10/17 [phone]

## 2017-03-10 NOTE — ED Notes (Signed)
Pt was discharged to lobby in care of his guardian, [name]. Pt and brother were given discharge instructions and an opportunity to ask questions. Pt was in no acute distress and ambulatory.

## 2017-03-10 NOTE — Discharge Instructions (Signed)
Please continue all your medicines. Follow-up withyour doctors as you were instructed to. Return for any further problems.

## 2017-03-10 NOTE — ED Notes (Signed)
Hourly rounding reveals patient in room. No complaints, stable, in no acute distress. Q15 minute rounds and monitoring via Security Cameras to continue. 

## 2017-03-10 NOTE — ED Notes (Signed)
Alexander Beard appeared anxious this a.m and said he was fearful. He said he's "not being listened to" by his brother and others responsible for his care. He says if he stays at his group home he will not be able to keep a job and be able to do the things he would like to do. He asked this Probation officer to share his concerns. Pt was polite, compliant with medications, and tremulous. He is now resting with eyes closed and regular, even respirations. Asked pt if he would like to take a shower and he said he had taken one yesterday but he would let us know. Will continue to monitor for needs/safety.

## 2017-03-29 ENCOUNTER — Other Ambulatory Visit: Payer: Self-pay | Admitting: Psychiatry

## 2017-03-30 NOTE — Telephone Encounter (Signed)
I completed all this and faxed it in last night

## 2017-08-15 ENCOUNTER — Ambulatory Visit: Payer: Medicare Other | Admitting: Psychiatry

## 2017-08-29 ENCOUNTER — Ambulatory Visit: Payer: Medicare Other | Admitting: Psychiatry

## 2017-09-05 ENCOUNTER — Ambulatory Visit (INDEPENDENT_AMBULATORY_CARE_PROVIDER_SITE_OTHER): Payer: Medicare Other | Admitting: Psychiatry

## 2017-09-05 ENCOUNTER — Encounter: Payer: Self-pay | Admitting: Psychiatry

## 2017-09-05 DIAGNOSIS — F259 Schizoaffective disorder, unspecified: Secondary | ICD-10-CM

## 2017-09-05 DIAGNOSIS — F840 Autistic disorder: Secondary | ICD-10-CM

## 2017-09-05 DIAGNOSIS — F250 Schizoaffective disorder, bipolar type: Secondary | ICD-10-CM | POA: Diagnosis not present

## 2017-09-05 MED ORDER — LORAZEPAM 0.5 MG PO TABS: 0.5000 mg | ORAL_TABLET | Freq: Three times a day (TID) | ORAL | 4 refills | Status: DC

## 2017-09-05 MED ORDER — DIVALPROEX SODIUM ER 500 MG PO TB24: ORAL_TABLET | ORAL | 10 refills | Status: DC

## 2017-09-05 MED ORDER — QUETIAPINE FUMARATE 100 MG PO TABS: ORAL_TABLET | ORAL | 10 refills | Status: DC

## 2017-09-05 MED ORDER — LAMOTRIGINE 100 MG PO TABS: 100.0000 mg | ORAL_TABLET | Freq: Every day | ORAL | 10 refills | Status: DC

## 2017-09-05 MED ORDER — QUETIAPINE FUMARATE 400 MG PO TABS: 400.0000 mg | ORAL_TABLET | Freq: Every day | ORAL | 10 refills | Status: DC

## 2017-09-05 MED ORDER — SERTRALINE HCL 100 MG PO TABS: ORAL_TABLET | ORAL | 10 refills | Status: DC

## 2017-09-05 MED ORDER — LURASIDONE HCL 40 MG PO TABS: 40.0000 mg | ORAL_TABLET | Freq: Every day | ORAL | 10 refills | Status: DC

## 2017-09-05 MED ORDER — GABAPENTIN 100 MG PO CAPS: 100.0000 mg | ORAL_CAPSULE | Freq: Three times a day (TID) | ORAL | 10 refills | Status: DC

## 2017-09-05 NOTE — Progress Notes (Signed)
Follow-up for 62 year old man with autistic spectrum disorder or schizoaffective disorder.  Patient comes with the manager of his group home.  He is neatly dressed.  Appropriate in his interaction.  He has some notes that he initially asks me to read but at the end of the appointment says that he would prefer to take home with him.  As usual he has complaints about how his brother treats him.  Today he was able to be more specific however and actually able to talk about some specific concerns about how his brother treated him.  This allowed for a little more appropriate validation and support for him.  No sign of any dangerousness.  I am told that at his group home his behavior has been exemplary.  No new physical problems.  Tolerating medicine well.  Neatly dressed.  Good eye contact.  Constricted and odd affect as usual.  Nothing out of the ordinary.  No sign of psychosis or dangerousness.  Validating and supportive and cognitive to the extent that it is possible therapy.  Continue all medicines which appear to be tolerated well follow-up 3 months.  I told him that if his brother could come with him to an appointment I would glad to speak with both of them at the same time.

## 2017-10-26 ENCOUNTER — Ambulatory Visit: Payer: Self-pay | Admitting: Internal Medicine

## 2017-11-08 ENCOUNTER — Encounter: Payer: Self-pay | Admitting: *Deleted

## 2017-11-14 ENCOUNTER — Ambulatory Visit: Payer: Medicare Other | Admitting: Anesthesiology

## 2017-11-14 ENCOUNTER — Encounter: Payer: Self-pay | Admitting: Anesthesiology

## 2017-11-14 ENCOUNTER — Ambulatory Visit
Admission: RE | Admit: 2017-11-14 | Discharge: 2017-11-14 | Disposition: A | Discharge status: Home / Self Care | Payer: Medicare Other | Source: Ambulatory Visit | Attending: Ophthalmology | Admitting: Ophthalmology

## 2017-11-14 ENCOUNTER — Other Ambulatory Visit: Payer: Self-pay

## 2017-11-14 ENCOUNTER — Encounter
Admission: RE | Disposition: A | Discharge status: Home / Self Care | Payer: Self-pay | Source: Ambulatory Visit | Attending: Ophthalmology

## 2017-11-14 DIAGNOSIS — G251 Drug-induced tremor: Secondary | ICD-10-CM | POA: Insufficient documentation

## 2017-11-14 DIAGNOSIS — M1990 Unspecified osteoarthritis, unspecified site: Secondary | ICD-10-CM | POA: Insufficient documentation

## 2017-11-14 DIAGNOSIS — Z79899 Other long term (current) drug therapy: Secondary | ICD-10-CM | POA: Insufficient documentation

## 2017-11-14 DIAGNOSIS — F329 Major depressive disorder, single episode, unspecified: Secondary | ICD-10-CM | POA: Insufficient documentation

## 2017-11-14 DIAGNOSIS — Z888 Allergy status to other drugs, medicaments and biological substances status: Secondary | ICD-10-CM | POA: Insufficient documentation

## 2017-11-14 DIAGNOSIS — Z794 Long term (current) use of insulin: Secondary | ICD-10-CM | POA: Diagnosis not present

## 2017-11-14 DIAGNOSIS — K219 Gastro-esophageal reflux disease without esophagitis: Secondary | ICD-10-CM | POA: Diagnosis not present

## 2017-11-14 DIAGNOSIS — F250 Schizoaffective disorder, bipolar type: Secondary | ICD-10-CM | POA: Insufficient documentation

## 2017-11-14 DIAGNOSIS — E669 Obesity, unspecified: Secondary | ICD-10-CM | POA: Insufficient documentation

## 2017-11-14 DIAGNOSIS — H2512 Age-related nuclear cataract, left eye: Secondary | ICD-10-CM | POA: Insufficient documentation

## 2017-11-14 DIAGNOSIS — Z6833 Body mass index (BMI) 33.0-33.9, adult: Secondary | ICD-10-CM | POA: Diagnosis not present

## 2017-11-14 DIAGNOSIS — Z880 Allergy status to penicillin: Secondary | ICD-10-CM | POA: Diagnosis not present

## 2017-11-14 DIAGNOSIS — I10 Essential (primary) hypertension: Secondary | ICD-10-CM | POA: Insufficient documentation

## 2017-11-14 DIAGNOSIS — E7800 Pure hypercholesterolemia, unspecified: Secondary | ICD-10-CM | POA: Diagnosis not present

## 2017-11-14 DIAGNOSIS — J449 Chronic obstructive pulmonary disease, unspecified: Secondary | ICD-10-CM | POA: Diagnosis not present

## 2017-11-14 DIAGNOSIS — G4730 Sleep apnea, unspecified: Secondary | ICD-10-CM | POA: Insufficient documentation

## 2017-11-14 DIAGNOSIS — Z7982 Long term (current) use of aspirin: Secondary | ICD-10-CM | POA: Diagnosis not present

## 2017-11-14 DIAGNOSIS — F840 Autistic disorder: Secondary | ICD-10-CM | POA: Diagnosis not present

## 2017-11-14 DIAGNOSIS — E119 Type 2 diabetes mellitus without complications: Secondary | ICD-10-CM | POA: Insufficient documentation

## 2017-11-14 HISTORY — DX: Tremor, unspecified: R25.1

## 2017-11-14 HISTORY — DX: Unspecified hearing loss, unspecified ear: H91.90

## 2017-11-14 HISTORY — DX: Palpitations: R00.2

## 2017-11-14 HISTORY — DX: Chronic obstructive pulmonary disease, unspecified: J44.9

## 2017-11-14 HISTORY — PX: CATARACT EXTRACTION W/PHACO: SHX586

## 2017-11-14 LAB — GLUCOSE, CAPILLARY: Glucose-Capillary: 144 mg/dL — ABNORMAL HIGH (ref 65–99)

## 2017-11-14 SURGERY — PHACOEMULSIFICATION, CATARACT, WITH IOL INSERTION
Anesthesia: Monitor Anesthesia Care | Site: Eye | Laterality: Left | Wound class: "Clean "

## 2017-11-14 MED ORDER — SODIUM CHLORIDE 0.9 % IV SOLN
INTRAVENOUS | Status: DC
  Administered 2017-11-14: 09:00:00 via INTRAVENOUS

## 2017-11-14 MED ORDER — MIDAZOLAM HCL 2 MG/2ML IJ SOLN
INTRAMUSCULAR | Status: DC | PRN
  Administered 2017-11-14: 1 mg via INTRAVENOUS

## 2017-11-14 MED ORDER — NA CHONDROIT SULF-NA HYALURON 40-17 MG/ML IO SOLN
INTRAOCULAR | Status: DC | PRN
  Administered 2017-11-14: 1 mL via INTRAOCULAR

## 2017-11-14 MED ORDER — MOXIFLOXACIN HCL 0.5 % OP SOLN: 1.0000 [drp] | OPHTHALMIC | Status: DC | PRN

## 2017-11-14 MED ORDER — LIDOCAINE HCL (PF) 4 % IJ SOLN
INTRAOCULAR | Status: DC | PRN
  Administered 2017-11-14: 2 mL via OPHTHALMIC

## 2017-11-14 MED ORDER — MOXIFLOXACIN HCL 0.5 % OP SOLN
OPHTHALMIC | Status: AC
  Filled 2017-11-14: qty 3

## 2017-11-14 MED ORDER — CARBACHOL 0.01 % IO SOLN
INTRAOCULAR | Status: DC | PRN
  Administered 2017-11-14: .5 mL via INTRAOCULAR

## 2017-11-14 MED ORDER — LIDOCAINE HCL (PF) 4 % IJ SOLN
INTRAMUSCULAR | Status: AC
  Filled 2017-11-14: qty 5

## 2017-11-14 MED ORDER — MIDAZOLAM HCL 2 MG/2ML IJ SOLN
INTRAMUSCULAR | Status: AC
  Filled 2017-11-14: qty 2

## 2017-11-14 MED ORDER — EPINEPHRINE PF 1 MG/ML IJ SOLN
INTRAMUSCULAR | Status: AC
  Filled 2017-11-14: qty 1

## 2017-11-14 MED ORDER — POVIDONE-IODINE 5 % OP SOLN
OPHTHALMIC | Status: AC
  Filled 2017-11-14: qty 30

## 2017-11-14 MED ORDER — POVIDONE-IODINE 5 % OP SOLN
OPHTHALMIC | Status: DC | PRN
  Administered 2017-11-14: 1 via OPHTHALMIC

## 2017-11-14 MED ORDER — ARMC OPHTHALMIC DILATING DROPS
OPHTHALMIC | Status: AC
  Filled 2017-11-14: qty 0.4

## 2017-11-14 MED ORDER — EPINEPHRINE PF 1 MG/ML IJ SOLN
INTRAOCULAR | Status: DC | PRN
  Administered 2017-11-14: 1 mL via OPHTHALMIC

## 2017-11-14 MED ORDER — NA CHONDROIT SULF-NA HYALURON 40-17 MG/ML IO SOLN
INTRAOCULAR | Status: AC
Start: 2017-11-14 — End: 2017-11-14
  Filled 2017-11-14: qty 1

## 2017-11-14 MED ORDER — MOXIFLOXACIN HCL 0.5 % OP SOLN
OPHTHALMIC | Status: DC | PRN
  Administered 2017-11-14: .2 mL via OPHTHALMIC

## 2017-11-14 MED ORDER — ARMC OPHTHALMIC DILATING DROPS
1.0000 "application " | OPHTHALMIC | Status: AC
  Administered 2017-11-14 (×3): 1 via OPHTHALMIC

## 2017-11-14 SURGICAL SUPPLY — 16 items
GLOVE BIO SURGEON STRL SZ8 (GLOVE) ×3 IMPLANT
GLOVE BIOGEL M 6.5 STRL (GLOVE) ×3 IMPLANT
GLOVE SURG LX 8.0 MICRO (GLOVE) ×2
GLOVE SURG LX STRL 8.0 MICRO (GLOVE) ×1 IMPLANT
GOWN STRL REUS W/ TWL LRG LVL3 (GOWN DISPOSABLE) ×2 IMPLANT
GOWN STRL REUS W/TWL LRG LVL3 (GOWN DISPOSABLE) ×4
LABEL CATARACT MEDS ST (LABEL) ×3 IMPLANT
LENS IOL TECNIS ITEC 16.0 (Intraocular Lens) ×2 IMPLANT
PACK CATARACT (MISCELLANEOUS) ×3 IMPLANT
PACK CATARACT BRASINGTON LX (MISCELLANEOUS) ×3 IMPLANT
PACK EYE AFTER SURG (MISCELLANEOUS) ×3 IMPLANT
SOL BSS BAG (MISCELLANEOUS) ×3
SOLUTION BSS BAG (MISCELLANEOUS) ×1 IMPLANT
SYR 5ML LL (SYRINGE) ×3 IMPLANT
WATER STERILE IRR 250ML POUR (IV SOLUTION) ×3 IMPLANT
WIPE NON LINTING 3.25X3.25 (MISCELLANEOUS) ×3 IMPLANT

## 2017-11-14 NOTE — Anesthesia Preprocedure Evaluation (Addendum)
Anesthesia Evaluation  Patient identified by MRN, date of birth, ID band Patient awake    Reviewed: Allergy & Precautions, NPO status , Patient's Chart, lab work & pertinent test results, reviewed documented beta blocker date and time   Airway Mallampati: III  TM Distance: >3 FB     Dental  (+) Chipped   Pulmonary sleep apnea , COPD,           Cardiovascular hypertension, Pt. on medications      Neuro/Psych PSYCHIATRIC DISORDERS Anxiety Depression Bipolar Disorder Schizophrenia  Neuromuscular disease    GI/Hepatic GERD  Controlled,  Endo/Other  diabetes, Type 2  Renal/GU      Musculoskeletal  (+) Arthritis ,   Abdominal   Peds  Hematology   Anesthesia Other Findings Autistic.Obese.  Reproductive/Obstetrics                            Anesthesia Physical Anesthesia Plan  ASA: III  Anesthesia Plan: MAC   Post-op Pain Management:    Induction:   PONV Risk Score and Plan:   Airway Management Planned:   Additional Equipment:   Intra-op Plan:   Post-operative Plan:   Informed Consent: I have reviewed the patients History and Physical, chart, labs and discussed the procedure including the risks, benefits and alternatives for the proposed anesthesia with the patient or authorized representative who has indicated his/her understanding and acceptance.     Plan Discussed with: CRNA  Anesthesia Plan Comments:         Anesthesia Quick Evaluation

## 2017-11-14 NOTE — Op Note (Signed)
PREOPERATIVE DIAGNOSIS:  Nuclear sclerotic cataract of the left eye.   POSTOPERATIVE DIAGNOSIS:  Nuclear sclerotic cataract of the left eye.   OPERATIVE PROCEDURE: Procedure(s): CATARACT EXTRACTION PHACO AND INTRAOCULAR LENS PLACEMENT (IOC)   SURGEON:  Birder Robson, MD.   ANESTHESIA:  Anesthesiologist: Gunnar Bulla, MD CRNA: Hedda Slade, CRNA Student Nurse Anesthetist: Carver Fila, RN  1.      Managed anesthesia care. 2.     0.26ml of Shugarcaine was instilled following the paracentesis   COMPLICATIONS:  None.   TECHNIQUE:   Stop and chop   DESCRIPTION OF PROCEDURE:  The patient was examined and consented in the preoperative holding area where the aforementioned topical anesthesia was applied to the left eye and then brought back to the Operating Room where the left eye was prepped and draped in the usual sterile ophthalmic fashion and a lid speculum was placed. A paracentesis was created with the side port blade and the anterior chamber was filled with viscoelastic. A near clear corneal incision was performed with the steel keratome. A continuous curvilinear capsulorrhexis was performed with a cystotome followed by the capsulorrhexis forceps. Hydrodissection and hydrodelineation were carried out with BSS on a blunt cannula. The lens was removed in a stop and chop  technique and the remaining cortical material was removed with the irrigation-aspiration handpiece. The capsular bag was inflated with viscoelastic and the Technis [id number] lens was placed in the capsular bag without complication. The remaining viscoelastic was removed from the eye with the irrigation-aspiration handpiece. The wounds were hydrated. The anterior chamber was flushed with Miostat and the eye was inflated to physiologic pressure. 0.28ml Vigamox was placed in the anterior chamber. The wounds were found to be water tight. The eye was dressed with Vigamox. The patient was given protective glasses to wear throughout the  day and a shield with which to sleep tonight. The patient was also given drops with which to begin a drop regimen today and will follow-up with me in one day. Implant Name Type Inv. Item Serial No. Manufacturer Lot No. LRB No. Used  LENS IOL DIOP 16.0 - [id number] Intraocular Lens LENS IOL DIOP 16.0 [id number] AMO  Left 1    Procedure(s) with comments: CATARACT EXTRACTION PHACO AND INTRAOCULAR LENS PLACEMENT (IOC) (Left) - [place] 00:42 AP% 14.6 CDE 6.12 Fluid pack lot # [id number]  Electronically signed: Birder Robson 11/14/2017 10:07 AM

## 2017-11-14 NOTE — OR Nursing (Signed)
Pt's brother advises pt staying with him tonight and he's taking his brother to follow up appt in am, then back to group home.  Copy of d/c instructions given to brother for him to give to group home tomorrow.

## 2017-11-14 NOTE — Anesthesia Postprocedure Evaluation (Signed)
Anesthesia Post Note  Patient: Alexander Beard  Procedure(s) Performed: CATARACT EXTRACTION PHACO AND INTRAOCULAR LENS PLACEMENT (IOC) (Left Eye)  Patient location during evaluation: PACU Anesthesia Type: MAC Level of consciousness: awake Pain management: pain level controlled Vital Signs Assessment: post-procedure vital signs reviewed and stable Respiratory status: spontaneous breathing Cardiovascular status: blood pressure returned to baseline Postop Assessment: no apparent nausea or vomiting Anesthetic complications: no     Last Vitals:  Vitals:   11/14/17 0845  BP: 99/67  Pulse: 60  Resp: 16  Temp: 36.5 C  SpO2: 99%    Last Pain:  Vitals:   11/14/17 0845  TempSrc: Temporal  PainSc: 0-No pain                 Carver Fila

## 2017-11-14 NOTE — Anesthesia Post-op Follow-up Note (Signed)
Anesthesia QCDR form completed.        

## 2017-11-14 NOTE — Transfer of Care (Signed)
Immediate Anesthesia Transfer of Care Note  Patient: Alexander Beard  Procedure(s) Performed: CATARACT EXTRACTION PHACO AND INTRAOCULAR LENS PLACEMENT (IOC) (Left Eye)  Patient Location: PACU  Anesthesia Type:MAC  Level of Consciousness: awake, alert  and oriented  Airway & Oxygen Therapy: Patient Spontanous Breathing  Post-op Assessment: Report given to RN and Post -op Vital signs reviewed and stable  Post vital signs: Reviewed and stable  Last Vitals:  Vitals Value Taken Time  BP    Temp    Pulse    Resp    SpO2      Last Pain:  Vitals:   11/14/17 0845  TempSrc: Temporal  PainSc: 0-No pain         Complications: No apparent anesthesia complications

## 2017-11-14 NOTE — H&P (Signed)
All labs reviewed. Abnormal studies sent to patients PCP when indicated.  Previous H&P reviewed, patient examined, there are NO CHANGES.  Alexander Chohan Porfilio5/7/20199:41 AM

## 2017-11-14 NOTE — Discharge Instructions (Signed)
Eye Surgery Discharge Instructions  Expect mild scratchy sensation or mild soreness. DO NOT RUB YOUR EYE!  The day of surgery:  Minimal physical activity, but bed rest is not required  No reading, computer work, or close hand work  No bending, lifting, or straining.  May watch TV  For 24 hours:  No driving, legal decisions, or alcoholic beverages  Safety precautions  Eat anything you prefer: It is better to start with liquids, then soup then solid foods.  _____ Eye patch should be worn until postoperative exam tomorrow.  ____ Solar shield eyeglasses should be worn for comfort in the sunlight/patch while sleeping  Resume all regular medications including aspirin or Coumadin if these were discontinued prior to surgery. You may shower, bathe, shave, or wash your hair. Tylenol may be taken for mild discomfort.  Call your doctor if you experience significant pain, nausea, or vomiting, fever > 101 or other signs of infection. [phone] or [phone] Specific instructions:  Follow-up Information    Birder Robson, MD Follow up.   Specialty:  Ophthalmology Why:  May 8 at 10:45am Contact information: [address] [place] [address] [phone]

## 2017-11-30 ENCOUNTER — Encounter: Payer: Self-pay | Admitting: Internal Medicine

## 2017-11-30 ENCOUNTER — Ambulatory Visit (INDEPENDENT_AMBULATORY_CARE_PROVIDER_SITE_OTHER): Payer: Medicare Other | Admitting: Internal Medicine

## 2017-11-30 ENCOUNTER — Ambulatory Visit (INDEPENDENT_AMBULATORY_CARE_PROVIDER_SITE_OTHER): Payer: Medicare Other | Admitting: Psychiatry

## 2017-11-30 ENCOUNTER — Encounter: Payer: Self-pay | Admitting: Psychiatry

## 2017-11-30 ENCOUNTER — Other Ambulatory Visit: Payer: Self-pay

## 2017-11-30 VITALS — BP 116/71 | HR 61 | Temp 97.8°F | Wt 225.8 lb

## 2017-11-30 VITALS — BP 128/80 | HR 59 | Ht 65.0 in | Wt 224.0 lb

## 2017-11-30 DIAGNOSIS — R0602 Shortness of breath: Secondary | ICD-10-CM | POA: Diagnosis not present

## 2017-11-30 DIAGNOSIS — Z9989 Dependence on other enabling machines and devices: Secondary | ICD-10-CM | POA: Diagnosis not present

## 2017-11-30 DIAGNOSIS — J449 Chronic obstructive pulmonary disease, unspecified: Secondary | ICD-10-CM

## 2017-11-30 DIAGNOSIS — G4733 Obstructive sleep apnea (adult) (pediatric): Secondary | ICD-10-CM

## 2017-11-30 DIAGNOSIS — F250 Schizoaffective disorder, bipolar type: Secondary | ICD-10-CM

## 2017-11-30 DIAGNOSIS — F840 Autistic disorder: Secondary | ICD-10-CM | POA: Diagnosis not present

## 2017-11-30 NOTE — Patient Instructions (Signed)

## 2017-11-30 NOTE — Progress Notes (Signed)
[facility] [place], [place] [address]  Pulmonary Sleep Medicine   Office Visit Note  Patient Name: Alexander Beard DOB: [birth year] MRN [id number]  Date of Service: 11/30/2017  Complaints/HPI:  Patient has done fairly well over the last year.  He has been using his CPAP but not to the full expected levels.  Currently is about 63% compliance.  His caretaker states that when he goes over to see his brother he generally does not use the CPAP device.  Patient has no admissions to the hospital.  Denies any cough congestion.  No chest pain noted.  Still some shortness of breath with exertion however.  Patient's been doing well otherwise  ROS  General: (-) fever, (-) chills, (-) night sweats, (-) weakness Skin: (-) rashes, (-) itching,. Eyes: (-) visual changes, (-) redness, (-) itching. Nose and Sinuses: (-) nasal stuffiness or itchiness, (-) postnasal drip, (-) nosebleeds, (-) sinus trouble. Mouth and Throat: (-) sore throat, (-) hoarseness. Neck: (-) swollen glands, (-) enlarged thyroid, (-) neck pain. Respiratory: - cough, (-) bloody sputum, - shortness of breath, - wheezing. Cardiovascular: - ankle swelling, (-) chest pain. Lymphatic: (-) lymph node enlargement. Neurologic: (-) numbness, (-) tingling. Psychiatric: (-) anxiety, (-) depression   Current Medication: Outpatient Encounter Medications as of 11/30/2017  Medication Sig Note  . acetaminophen (TYLENOL) 650 MG CR tablet Take 650 mg by mouth every 12 (twelve) hours as needed for pain.   Marland Kitchen aspirin EC 81 MG tablet Take 81 mg by mouth daily.   . Calcium Carbonate-Vitamin D3 (CALCIUM 600-D) 600-400 MG-UNIT TABS Take 1 tablet by mouth 2 (two) times daily.   . Cholecalciferol (VITAMIN D-1000 MAX ST) 1000 UNITS tablet Take 1,000 Units by mouth daily.    . diphenhydrAMINE (BENADRYL) 25 mg capsule Take 25 mg by mouth every 4 (four) hours as needed for itching or allergies.   Marland Kitchen divalproex (DEPAKOTE ER) 500 MG 24  hr tablet TAKE 4 TABLETS (2000MG ) BY MOUTH AT BEDTIME *DO NOT CRUSH*   . docusate sodium (COLACE) 100 MG capsule Take 100 mg by mouth daily.    . furosemide (LASIX) 20 MG tablet Take 20 mg by mouth daily.    Marland Kitchen gabapentin (NEURONTIN) 100 MG capsule Take 1 capsule (100 mg total) by mouth 3 (three) times daily.   . hydrocortisone (ANUSOL-HC) 2.5 % rectal cream Place 1 application rectally 4 (four) times daily as needed for hemorrhoids or anal itching.   . Insulin Detemir (LEVEMIR FLEXTOUCH) 100 UNIT/ML Pen Inject 15 Units into the skin daily at 10 pm.   . lamoTRIgine (LAMICTAL) 100 MG tablet Take 1 tablet (100 mg total) by mouth at bedtime.   Marland Kitchen lisinopril (PRINIVIL,ZESTRIL) 5 MG tablet Take 5 mg by mouth daily.    Marland Kitchen loratadine (CLARITIN) 10 MG tablet Take 1 tablet (10 mg total) by mouth daily.   Marland Kitchen LORazepam (ATIVAN) 0.5 MG tablet Take 1 tablet (0.5 mg total) by mouth 3 (three) times daily.   Marland Kitchen lurasidone (LATUDA) 40 MG TABS tablet Take 1 tablet (40 mg total) by mouth daily. with food   . Menthol (BIOFREEZE) 10 % AERO Apply 1 application topically as directed. 11/06/2017: Per MAR  . metoprolol succinate (TOPROL-XL) 50 MG 24 hr tablet Take 50 mg by mouth daily.    . pantoprazole (PROTONIX) 40 MG tablet Take 40 mg by mouth daily.   . permethrin (ELIMITE) 5 % cream Apply 1 application topically as directed. 11/06/2017: Per MAR  . polyethylene  glycol (MIRALAX / GLYCOLAX) packet Take 17 g by mouth daily.   . QUEtiapine (SEROQUEL) 100 MG tablet TAKE ONE TABLET BY MOUTH 3 TIMES A DAY TAKE 1 TABLET BY MOUTH DAILY AS NEEDED FOR ANXIETY (Patient taking differently: Take 100 mg by mouth See admin instructions. Take 100 mg by mouth 3 times daily. Take 100 mg by mouth daily as needed for anxiety)   . QUEtiapine (SEROQUEL) 400 MG tablet Take 1 tablet (400 mg total) by mouth at bedtime.   . sertraline (ZOLOFT) 100 MG tablet TAKE TWO TABLETS (200MG ) BY MOUTH ONCE DAILY   . simethicone (MYLICON) 80 MG chewable tablet  Chew 80-160 mg by mouth 2 (two) times daily as needed for flatulence.   . simvastatin (ZOCOR) 20 MG tablet Take 20 mg by mouth at bedtime.    . sitaGLIPtin (JANUVIA) 100 MG tablet Take 100 mg by mouth daily.   . solifenacin (VESICARE) 5 MG tablet Take 5 mg by mouth daily.   . Starch (HEMORRHOIDAL RE) Place 1 application rectally 4 (four) times daily as needed (for itching).   . tamsulosin (FLOMAX) 0.4 MG CAPS capsule Take 0.4 mg by mouth daily.   Marland Kitchen tolnaftate (TINACTIN) 1 % spray Apply 1 application topically daily.   . hydrOXYzine (ATARAX/VISTARIL) 10 MG tablet Take 1 tablet (10 mg total) by mouth 3 (three) times daily as needed. (Patient taking differently: Take 10 mg by mouth 3 (three) times daily as needed for itching or anxiety. )   . insulin glargine (LANTUS) 100 UNIT/ML injection Inject 0.12 mLs (12 Units total) into the skin at bedtime. (Patient not taking: Reported on 11/14/2017)   . meloxicam (MOBIC) 15 MG tablet Take 15 mg by mouth daily.   . naproxen (NAPROSYN) 500 MG tablet TAKE ONE TABLET BY MOUTH 2 TIMES A DAY AS NEEDED FOR PAIN *TAKE WITH FOOD*    No facility-administered encounter medications on file as of 11/30/2017.     Surgical History: Past Surgical History:  Procedure Laterality Date  . CATARACT EXTRACTION W/PHACO Left 11/14/2017   Procedure: CATARACT EXTRACTION PHACO AND INTRAOCULAR LENS PLACEMENT (IOC);  Surgeon: Birder Robson, MD;  Location: [facility] ORS;  Service: Ophthalmology;  Laterality: Left;  [place] 00:42 AP% 14.6 CDE 6.12 Fluid pack lot # [id number]  . COLONOSCOPY  01/28/2005  . COLONOSCOPY WITH PROPOFOL N/A 05/09/2016   Procedure: COLONOSCOPY WITH PROPOFOL;  Surgeon: Manya Silvas, MD;  Location: [facility] ENDOSCOPY;  Service: Endoscopy;  Laterality: N/A;  . ESOPHAGOGASTRODUODENOSCOPY N/A 05/09/2016   Procedure: ESOPHAGOGASTRODUODENOSCOPY (EGD);  Surgeon: Manya Silvas, MD;  Location: [facility] ENDOSCOPY;  Service: Endoscopy;  Laterality: N/A;  . FRACTURE SURGERY      ORIF SHOULDER  . TONSILLECTOMY      Medical History: Past Medical History:  Diagnosis Date  . Anxiety   . Anxiety disorder due to known physiological condition    HOSPITALIZED 10/18  . Arthritis   . Autistic disorder, residual state   . COPD (chronic obstructive pulmonary disease) ([place])   . Depression   . Developmental disorder   . Diabetes mellitus without complication ([place])   . Dyslipidemia   . Esophageal reflux   . HOH (hard of hearing)    MILDLY  . Hypertension   . Obesity   . Overactive bladder   . Palpitations    ANXIETY  . Schizophrenia, schizoaffective ([place])   . Sleep apnea   . Tremors of nervous system    HANDS DUE TO MEDICATIONS  . Urinary incontinence  Family History: Family History  Problem Relation Age of Onset  . Hypertension Mother   . Stroke Father   . Heart Problems Father     Social History: Social History   Socioeconomic History  . Marital status: Single    Spouse name: Not on file  . Number of children: Not on file  . Years of education: Not on file  . Highest education level: Not on file  Occupational History  . Not on file  Social Needs  . Financial resource strain: Not on file  . Food insecurity:    Worry: Not on file    Inability: Not on file  . Transportation needs:    Medical: Not on file    Non-medical: Not on file  Tobacco Use  . Smoking status: Never Smoker  . Smokeless tobacco: Never Used  Substance and Sexual Activity  . Alcohol use: No    Alcohol/week: 0.0 oz  . Drug use: No  . Sexual activity: Never  Lifestyle  . Physical activity:    Days per week: Not on file    Minutes per session: Not on file  . Stress: Not on file  Relationships  . Social connections:    Talks on phone: Not on file    Gets together: Not on file    Attends religious service: Not on file    Active member of club or organization: Not on file    Attends meetings of clubs or organizations: Not on file    Relationship status: Not on file   . Intimate partner violence:    Fear of current or ex partner: Not on file    Emotionally abused: Not on file    Physically abused: Not on file    Forced sexual activity: Not on file  Other Topics Concern  . Not on file  Social History Narrative   The patient never finished high school but did get his GED. 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 He is currently in disability and [name] is his legal guardian. He has been living in group homes for many years.      No pending legal charges    Vital Signs: Blood pressure 128/80, pulse (!) 59, height 5\' 5"  (1.651 m), weight 224 lb (101.6 kg), head circumference 16" (40.6 cm), SpO2 95 %.  Examination: General Appearance: The patient is well-developed, well-nourished, and in no distress. Skin: Gross inspection of skin unremarkable. Head: normocephalic, no gross deformities. Eyes: no gross deformities noted. ENT: ears appear grossly normal no exudates. Neck: Supple. No thyromegaly. No LAD. Respiratory: clear. Cardiovascular: Normal S1 and S2 without murmur or rub. Extremities: No cyanosis. pulses are equal. Neurologic: Alert and oriented. No involuntary movements.  LABS: Recent Results (from the past 2160 hour(s))  Glucose, capillary     Status: Abnormal   Collection Time: 11/14/17  8:42 AM  Result Value Ref Range   Glucose-Capillary 144 (H) 65 - 99 mg/dL    Radiology: No results found.  No results found.  No results found.    Assessment and Plan: Patient Active Problem List   Diagnosis Date Noted  . Diabetes ([place]) 04/10/2015  . Hypertension 04/10/2015  . Prostate hypertrophy 04/10/2015  . Schizoaffective disorder ([place]) 04/10/2015  . Enlarged prostate 04/10/2015  . Essential (primary) hypertension 04/10/2015  . Type 2 diabetes mellitus ([place]) 04/10/2015  . Controlled type 2 diabetes mellitus without complication ([place]) 02/58/5277  .  Schizoaffective disorder, bipolar type ([place]) 12/29/2014  . Active autistic disorder 12/29/2014    1. OSA continue with CPAP device encourage compliance with the current machine.  I would like to seen greater than 80% compliance.  We will continue with supportive care also discuss sleep hygiene 2. COPD pulmonary function studies ordered today. 3. SOB  Likely related to COPD we will continue with present management continue supportive care  General Counseling: I have discussed the findings of the evaluation and examination with [name] Done.  I have also discussed any further diagnostic evaluation thatmay be needed or ordered today. Landin verbalizes understanding of the findings of todays visit. We also reviewed his medications today and discussed drug interactions and side effects including but not limited excessive drowsiness and altered mental states. We also discussed that there is always a risk not just to him but also people around him. he has been encouraged to call the office with any questions or concerns that should arise related to todays visit.    Time spent: 65min  I have personally obtained a history, examined the patient, evaluated laboratory and imaging results, formulated the assessment and plan and placed orders.    [name], MD [facility] Pulmonary and Critical Care Sleep medicine

## 2017-12-27 ENCOUNTER — Ambulatory Visit (INDEPENDENT_AMBULATORY_CARE_PROVIDER_SITE_OTHER): Payer: Medicare Other | Admitting: Internal Medicine

## 2017-12-27 DIAGNOSIS — R0602 Shortness of breath: Secondary | ICD-10-CM

## 2017-12-27 LAB — PULMONARY FUNCTION TEST

## 2018-01-08 NOTE — Procedures (Signed)
[place], [address]  DATE OF SERVICE: December 27, 2017  Complete Pulmonary Function Testing Interpretation:  FINDINGS:   forced vital capacity is normal the FEV1 is normal FEV1 FVC ratio is normal.  Post bronchodilator there is no significant change in the FEV1 however clinical improvement may occur in the absence of spirometric improvement.   Total lung capacity is normal as measured by body plethysmography.  Residual volume is reduced residual volume is decreased .  Residual volume total lung capacity ratio is decreased.  Functional residual capacity is normal. The DLCO is normal.  IMPRESSION:    This pulmonary function study is within normal limits The DLCO is normal  [name], MD [facility] Pulmonary Critical Care Medicine Sleep Medicine

## 2018-04-07 ENCOUNTER — Encounter: Payer: Self-pay | Admitting: Emergency Medicine

## 2018-04-07 ENCOUNTER — Other Ambulatory Visit: Payer: Self-pay

## 2018-04-07 ENCOUNTER — Emergency Department
Admission: EM | Admit: 2018-04-07 | Discharge: 2018-04-07 | Disposition: A | Discharge status: Home / Self Care | Payer: Medicare Other | Attending: Emergency Medicine | Admitting: Emergency Medicine

## 2018-04-07 DIAGNOSIS — F259 Schizoaffective disorder, unspecified: Secondary | ICD-10-CM | POA: Diagnosis not present

## 2018-04-07 DIAGNOSIS — F419 Anxiety disorder, unspecified: Secondary | ICD-10-CM

## 2018-04-07 DIAGNOSIS — Z794 Long term (current) use of insulin: Secondary | ICD-10-CM | POA: Diagnosis not present

## 2018-04-07 DIAGNOSIS — Z7982 Long term (current) use of aspirin: Secondary | ICD-10-CM | POA: Diagnosis not present

## 2018-04-07 DIAGNOSIS — E1165 Type 2 diabetes mellitus with hyperglycemia: Secondary | ICD-10-CM | POA: Insufficient documentation

## 2018-04-07 DIAGNOSIS — F32A Depression, unspecified: Secondary | ICD-10-CM

## 2018-04-07 DIAGNOSIS — J449 Chronic obstructive pulmonary disease, unspecified: Secondary | ICD-10-CM | POA: Insufficient documentation

## 2018-04-07 DIAGNOSIS — I10 Essential (primary) hypertension: Secondary | ICD-10-CM | POA: Diagnosis not present

## 2018-04-07 DIAGNOSIS — R739 Hyperglycemia, unspecified: Secondary | ICD-10-CM

## 2018-04-07 DIAGNOSIS — Z79899 Other long term (current) drug therapy: Secondary | ICD-10-CM | POA: Diagnosis not present

## 2018-04-07 DIAGNOSIS — F329 Major depressive disorder, single episode, unspecified: Secondary | ICD-10-CM | POA: Diagnosis not present

## 2018-04-07 LAB — COMPREHENSIVE METABOLIC PANEL
ALBUMIN: 3.5 g/dL (ref 3.5–5.0)
ALT: 8 U/L (ref 0–44)
AST: 25 U/L (ref 15–41)
Alkaline Phosphatase: 61 U/L (ref 38–126)
Anion gap: 10 (ref 5–15)
BILIRUBIN TOTAL: 0.5 mg/dL (ref 0.3–1.2)
BUN: 33 mg/dL — AB (ref 8–23)
CHLORIDE: 102 mmol/L (ref 98–111)
CO2: 28 mmol/L (ref 22–32)
Calcium: 8.6 mg/dL — ABNORMAL LOW (ref 8.9–10.3)
Creatinine, Ser: 0.99 mg/dL (ref 0.61–1.24)
GFR calc Af Amer: >60 mL/min (ref >60)
GFR calc non Af Amer: >60 mL/min (ref >60)
GLUCOSE: 204 mg/dL — AB (ref 70–99)
Potassium: 4.2 mmol/L (ref 3.5–5.1)
Sodium: 140 mmol/L (ref 135–145)
TOTAL PROTEIN: 7 g/dL (ref 6.5–8.1)

## 2018-04-07 LAB — CBC
HCT: 40.3 % (ref 40.0–52.0)
HEMOGLOBIN: 14 g/dL (ref 13.0–18.0)
MCH: 33.3 pg (ref 26.0–34.0)
MCHC: 34.7 g/dL (ref 32.0–36.0)
MCV: 95.8 fL (ref 80.0–100.0)
PLATELETS: 139 10*3/uL — AB (ref 150–440)
RBC: 4.2 MIL/uL — ABNORMAL LOW (ref 4.40–5.90)
RDW: 13.2 % (ref 11.5–14.5)
WBC: 5.1 10*3/uL (ref 3.8–10.6)

## 2018-04-07 LAB — ACETAMINOPHEN LEVEL

## 2018-04-07 LAB — URINE DRUG SCREEN, QUALITATIVE (ARMC ONLY)
Amphetamines, Ur Screen: NOT DETECTED
Barbiturates, Ur Screen: NOT DETECTED
Benzodiazepine, Ur Scrn: NOT DETECTED
CANNABINOID 50 NG, UR ~~LOC~~: NOT DETECTED
COCAINE METABOLITE, UR ~~LOC~~: NOT DETECTED
MDMA (ECSTASY) UR SCREEN: NOT DETECTED
Methadone Scn, Ur: NOT DETECTED
OPIATE, UR SCREEN: NOT DETECTED
PHENCYCLIDINE (PCP) UR S: NOT DETECTED
TRICYCLIC, UR SCREEN: POSITIVE — AB

## 2018-04-07 LAB — GLUCOSE, CAPILLARY
GLUCOSE-CAPILLARY: 89 mg/dL (ref 70–99)
Glucose-Capillary: 163 mg/dL — ABNORMAL HIGH (ref 70–99)

## 2018-04-07 LAB — SALICYLATE LEVEL: Salicylate Lvl: <7 mg/dL (ref 2.8–30.0)

## 2018-04-07 LAB — ETHANOL

## 2018-04-07 MED ORDER — INSULIN ASPART 100 UNIT/ML ~~LOC~~ SOLN: 0.0000 [IU] | Freq: Three times a day (TID) | SUBCUTANEOUS | Status: DC

## 2018-04-07 NOTE — ED Notes (Signed)
Pt's brother [name] left the number [phone] for him to be notified when pt is discharged.

## 2018-04-07 NOTE — ED Triage Notes (Signed)
Brother states patient had "medt down " 3 days ago. He describes this as he went to school from group home and was having trouble getting out of [facility], [employer] helped him out, few minutes later he "sneaked" out of group home and told them "they have taken away all my freedom". Does not become violent to himself or others. Brother is healthcare power of atorrney but not legal guardian. He does not have a legal guardian. Lives in [facility] [place].

## 2018-04-07 NOTE — ED Notes (Signed)
Report given to [facility] doctor. Per [facility] recommendations pt will be discharged back to brother.

## 2018-04-07 NOTE — ED Notes (Signed)
Pt given all his belongings. Discharge teaching done and pt verbalized minimal understanding. Discharge teaching done with his brother [name] also who is also his caregiver at times. Both pt and brother signed discharge paper form. Ambulatory and in NAD at discharge.

## 2018-04-07 NOTE — ED Notes (Signed)
Brother who is healthcare power of attorney is Jayten Gabbard phone number [phone]. Will be available for contact when discharged.

## 2018-04-07 NOTE — ED Notes (Signed)
Pt wearing gray disposable brief; left in it.

## 2018-04-07 NOTE — ED Notes (Signed)
Pt reports he is doing well, p;easant and smiling on approach. He denied SI/HI/AVH at time of assessment. He did state that he has a hx of SI but no plan. When asked if he felt like hurting himself pt stated "I do feel that all the time" but was able to contract for safety. Pt complains that he does not like the group home he is currently living at because they do not let him walk outside. They just want him to stay in the house all day. Pt became tearful several times talking about all he wants is to be able to walk outside and go into town to eat at his favorite restaurants. Pt surprised that he is at the ED when he was re-oriented about where he was. He wants to be given some art supplies so he can sketch and draw. Advised he can only get crayons which he declined stating "I don't like crayons".

## 2018-04-07 NOTE — ED Notes (Signed)
PT has POA: Brother; Manuela Schwartz

## 2018-04-07 NOTE — BH Assessment (Signed)
Assessment Note  Alexander Beard is an 62 y.o. male. Patient presents to [facility] voluntarily with his brother due to anxiety. Patient has a history of Schizoaffective Disorder and Autism. Patient reports he doesn't want to return to his group home because they don't allow him the freedom to leave as he likes. Patient endorses ongoing SI, but denies intent. Patient denies HI and AVH. Patient appears to be experiencing persecutory delusions. Patient endorses depression and panic attacks.   Patient has been a resident at [facility] in [place], [place] since 2013. Per director Alexander Beard [phone]) she is unsure why patient is at [facility] today, however she said patient keeps leaving the group home without permission which is a danger to himself and a liability for them. Alexander Beard states the group home is a 24 hours supervised facility, however they are not capable of having a one to one for patient nor or they required to. Alexander Beard states patient feels everything is being taken away from him, however patient often leaves the group home to participate in various activities and goes to stay with his brother Alexander Beard who is also is POA on the weekends. She states patient describes his behaviors as panic attacks but they are actually behavioral outbursts. Alexander Beard states she doesn't feel patient is a threat to himself or anyone else.   Per patient brother/POA Alexander Beard [phone]) patient has been diagnosed with Autism and Schizoaffective Disorder. Alexander Beard states patient has an outburst on Wednesday and he went to the group home to pick him up. Alexander Beard states patient has had any issues in a year up until now. Alexander Beard states patient went into [employer] and had the librarian call the police and that is why he brought him to [facility] to be evaluated. Alexander Beard states patient has left the group home twice and went to the police station.   Patient denies illicit drug and alcohol use.  Patient receives medication  management from Dr. Prescott Gum.  Patient presents oriented x 4, pleasant, anxious, and engaged during assessment.   Diagnosis: Schizoaffective Disorder   Past Medical History:  Past Medical History:  Diagnosis Date  . Anxiety   . Anxiety disorder due to known physiological condition    HOSPITALIZED 10/18  . Arthritis   . Autistic disorder, residual state   . COPD (chronic obstructive pulmonary disease) ([place])   . Depression   . Developmental disorder   . Diabetes mellitus without complication ([place])   . Dyslipidemia   . Esophageal reflux   . HOH (hard of hearing)    MILDLY  . Hypertension   . Obesity   . Overactive bladder   . Palpitations    ANXIETY  . Schizophrenia, schizoaffective ([place])   . Sleep apnea   . Tremors of nervous system    HANDS DUE TO MEDICATIONS  . Urinary incontinence     Past Surgical History:  Procedure Laterality Date  . CATARACT EXTRACTION W/PHACO Left 11/14/2017   Procedure: CATARACT EXTRACTION PHACO AND INTRAOCULAR LENS PLACEMENT (IOC);  Surgeon: Birder Robson, MD;  Location: [facility] ORS;  Service: Ophthalmology;  Laterality: Left;  [place] 00:42 AP% 14.6 CDE 6.12 Fluid pack lot # [id number]  . COLONOSCOPY  01/28/2005  . COLONOSCOPY WITH PROPOFOL N/A 05/09/2016   Procedure: COLONOSCOPY WITH PROPOFOL;  Surgeon: Manya Silvas, MD;  Location: [facility] ENDOSCOPY;  Service: Endoscopy;  Laterality: N/A;  . ESOPHAGOGASTRODUODENOSCOPY N/A 05/09/2016   Procedure: ESOPHAGOGASTRODUODENOSCOPY (EGD);  Surgeon: Manya Silvas, MD;  Location: [facility] ENDOSCOPY;  Service: Endoscopy;  Laterality: N/A;  . FRACTURE SURGERY     ORIF SHOULDER  . TONSILLECTOMY      Family History:  Family History  Problem Relation Age of Onset  . Hypertension Mother   . Stroke Father   . Heart Problems Father     Social History:  reports that he has never smoked. He has never used smokeless tobacco. He reports that he does not drink alcohol or use drugs.  Additional Social History:   Alcohol / Drug Use Pain Medications: SEE PTA  Prescriptions: SEE PTA  Over the Counter: SEE PTA  History of alcohol / drug use?: No history of alcohol / drug abuse  CIWA: CIWA-Ar BP: 116/72 Pulse Rate: 66 COWS:    Allergies:  Allergies  Allergen Reactions  . Other Other (See Comments)    MYACINS  . Penicillins Nausea And Vomiting  . Cortizone-10 [Hydrocortisone] Rash    Home Medications:  (Not in a hospital admission)  OB/GYN Status:  No LMP for male patient.  General Assessment Data Assessment unable to be completed: (Assessment completed ) Location of Assessment: [facility] ED TTS Assessment: In system Is this a Tele or Face-to-Face Assessment?: Face-to-Face Is this an Initial Assessment or a Re-assessment for this encounter?: Initial Assessment Patient Accompanied by:: N/A Language Other than English: No Living Arrangements: In Group Home: (Comment: Name of Group Home)([facility] Alexander Beard [phone])) What gender do you identify as?: Male Marital status: Single Maiden name: N/A Pregnancy Status: No Living Arrangements: Group Home([facility] Alexander Beard [phone])) Can pt return to current living arrangement?: Yes Admission Status: Voluntary Is patient capable of signing voluntary admission?: No Referral Source: Self/Family/Friend Insurance type: Medicare  Medical Screening Exam ([place]) Medical Exam completed: Yes  Crisis Care Plan Living Arrangements: Group Home([facility] Alexander Beard [phone])) Legal Guardian: Other:(None reported) Name of Psychiatrist: [name] Name of Therapist: None reported   Education Status Is patient currently in school?: No Is the patient employed, unemployed or receiving disability?: Receiving disability income  Risk to self with the past 6 months Suicidal Ideation: Yes-Currently Present Has patient been a risk to self within the past 6 months prior to admission? : No Suicidal Intent:  No Has patient had any suicidal intent within the past 6 months prior to admission? : No Is patient at risk for suicide?: No Suicidal Plan?: No Has patient had any suicidal plan within the past 6 months prior to admission? : No Access to Means: No What has been your use of drugs/alcohol within the last 12 months?: None reported Previous Attempts/Gestures: No How many times?: 0 Other Self Harm Risks: None reported Triggers for Past Attempts: Other (Comment)(None reported ) Intentional Self Injurious Behavior: None Family Suicide History: No Recent stressful life event(s): Turmoil (Comment)(Living situations) Persecutory voices/beliefs?: No Depression: Yes Depression Symptoms: Feeling worthless/self pity, Feeling angry/irritable Substance abuse history and/or treatment for substance abuse?: No Suicide prevention information given to non-admitted patients: Not applicable  Risk to Others within the past 6 months Homicidal Ideation: No Does patient have any lifetime risk of violence toward others beyond the six months prior to admission? : No Thoughts of Harm to Others: No Current Homicidal Intent: No Current Homicidal Plan: No Access to Homicidal Means: No Identified Victim: None reported History of harm to others?: No Assessment of Violence: None Noted Violent Behavior Description: None reported Does patient have access to weapons?: No Criminal Charges Pending?: No Does patient have a court date: No Is patient on probation?: No  Psychosis Hallucinations: None noted Delusions: Persecutory  Mental Status Report Appearance/Hygiene: In scrubs Eye Contact: Fair Motor Activity: Unremarkable Speech: Pressured Level of Consciousness: Alert Mood: Anxious Affect: Anxious Anxiety Level: Moderate Thought Processes: Coherent Judgement: Impaired Orientation: Person, Place, Time, Situation Obsessive Compulsive Thoughts/Behaviors: None  Cognitive Functioning Concentration:  Good Memory: Recent Intact, Remote Intact Is patient IDD: No Insight: Fair Impulse Control: Poor Appetite: Good Have you had any weight changes? : No Change Sleep: No Change Total Hours of Sleep: 8 Vegetative Symptoms: None  ADLScreening [facility]) Patient's cognitive ability adequate to safely complete daily activities?: Yes Patient able to express need for assistance with ADLs?: Yes Independently performs ADLs?: Yes (appropriate for developmental age)  Prior Inpatient Therapy Prior Inpatient Therapy: No  Prior Outpatient Therapy Prior Outpatient Therapy: Yes Prior Therapy Dates: current Prior Therapy Facilty/Provider(s): [name] Reason for Treatment: Autism, Schizoaffective Disorder Does patient have an ACCT team?: No Does patient have Intensive In-House Services?  : No Does patient have [facility] services? : No Does patient have P4CC services?: No  ADL Screening (condition at time of admission) Patient's cognitive ability adequate to safely complete daily activities?: Yes Is the patient deaf or have difficulty hearing?: No Does the patient have difficulty seeing, even when wearing glasses/contacts?: No Does the patient have difficulty concentrating, remembering, or making decisions?: No Patient able to express need for assistance with ADLs?: Yes Does the patient have difficulty dressing or bathing?: No Independently performs ADLs?: Yes (appropriate for developmental age) Does the patient have difficulty walking or climbing stairs?: No Weakness of Legs: None Weakness of Arms/Hands: None  Home Assistive Devices/Equipment Home Assistive Devices/Equipment: None  Therapy Consults (therapy consults require a physician order) PT Evaluation Needed: No OT Evalulation Needed: No SLP Evaluation Needed: No Abuse/Neglect Assessment (Assessment to be complete while patient is alone) Abuse/Neglect Assessment Can Be Completed: Yes Physical Abuse: Denies Verbal  Abuse: Denies Sexual Abuse: Denies Exploitation of patient/patient's resources: Denies Self-Neglect: Denies Values / Beliefs Cultural Requests During Hospitalization: None Consults Spiritual Care Consult Needed: No Social Work Consult Needed: No Regulatory affairs officer (For Healthcare) Does Patient Have a Medical Advance Directive?: No          Disposition:  Disposition Initial Assessment Completed for this Encounter: Yes Patient referred to: Other (Comment)(Pending psych consult )  On Site Evaluation by:   Reviewed with Physician:    Jodie Echevaria, [place], [place] 04/07/2018 4:07 PM

## 2018-04-07 NOTE — ED Notes (Signed)
Pt changed into burgundy scrubs  Belongings: suspenders, brown shoes, khaki pants, navy polo type shirt; [place] card on chain; bag with glasses case, pencil, pencil sharpener, golf scorecard, comb, alcohol wipes x 2; brown socks

## 2018-04-07 NOTE — ED Triage Notes (Signed)
Pt presents via [facility]  ([employer]) with SI; states he "always" has suicidal ideation. States he lives in a group home and that he runs away because "Ms. Freda Munro" told him that he will be sent to [facility] if he came to the hospital again. He states he has been there and it is a "horrible place" and "it would destroy me." Pt tearful during triage. LEO states he took his medication just before arrival.

## 2018-04-07 NOTE — ED Provider Notes (Signed)
[facility] Emergency Department Provider Note ____________________________________________   I have reviewed the triage vital signs and the triage nursing note.  HISTORY  Chief Complaint Suicidal   Historian Patient  HPI Alexander Beard is a 62 y.o. male with hx of schizoaffective/schizophrenia from a group home, reports he is unhappy at the group home and wants to go to another group home.  States he is no longer allowed to "walk all over the streets of [place] he is only allowed to drive with crayons and he wants to go with a pencil.  Patient states that he has trouble controlling his emotions and he starts to yell.  States that he does not get physical or violent.  Apparently patient had made suicidal statements, although he tells me he has some of these thoughts at times, he does not feel like he would ever act on them.  Reports feeling anxious.  Psychiatrist is [name].    Past Medical History:  Diagnosis Date  . Anxiety   . Anxiety disorder due to known physiological condition    HOSPITALIZED 10/18  . Arthritis   . Autistic disorder, residual state   . COPD (chronic obstructive pulmonary disease) ([place])   . Depression   . Developmental disorder   . Diabetes mellitus without complication ([place])   . Dyslipidemia   . Esophageal reflux   . HOH (hard of hearing)    MILDLY  . Hypertension   . Obesity   . Overactive bladder   . Palpitations    ANXIETY  . Schizophrenia, schizoaffective ([place])   . Sleep apnea   . Tremors of nervous system    HANDS DUE TO MEDICATIONS  . Urinary incontinence     Patient Active Problem List   Diagnosis Date Noted  . Diabetes ([place]) 04/10/2015  . Hypertension 04/10/2015  . Prostate hypertrophy 04/10/2015  . Schizoaffective disorder ([place]) 04/10/2015  . Enlarged prostate 04/10/2015  . Essential (primary) hypertension 04/10/2015  . Type 2 diabetes mellitus ([place]) 04/10/2015  . Controlled type 2  diabetes mellitus without complication ([place]) 40/98/1191  . Schizoaffective disorder, bipolar type ([place]) 12/29/2014  . Active autistic disorder 12/29/2014    Past Surgical History:  Procedure Laterality Date  . CATARACT EXTRACTION W/PHACO Left 11/14/2017   Procedure: CATARACT EXTRACTION PHACO AND INTRAOCULAR LENS PLACEMENT (IOC);  Surgeon: Birder Robson, MD;  Location: [facility] ORS;  Service: Ophthalmology;  Laterality: Left;  [place] 00:42 AP% 14.6 CDE 6.12 Fluid pack lot # [id number]  . COLONOSCOPY  01/28/2005  . COLONOSCOPY WITH PROPOFOL N/A 05/09/2016   Procedure: COLONOSCOPY WITH PROPOFOL;  Surgeon: Manya Silvas, MD;  Location: [facility] ENDOSCOPY;  Service: Endoscopy;  Laterality: N/A;  . ESOPHAGOGASTRODUODENOSCOPY N/A 05/09/2016   Procedure: ESOPHAGOGASTRODUODENOSCOPY (EGD);  Surgeon: Manya Silvas, MD;  Location: [facility] ENDOSCOPY;  Service: Endoscopy;  Laterality: N/A;  . FRACTURE SURGERY     ORIF SHOULDER  . TONSILLECTOMY      Prior to Admission medications   Medication Sig Start Date End Date Taking? Authorizing Provider  acetaminophen (TYLENOL) 650 MG CR tablet Take 650 mg by mouth every 12 (twelve) hours as needed for pain.    [provider]  aspirin EC 81 MG tablet Take 81 mg by mouth daily.    [provider]  Calcium Carbonate-Vitamin D3 (CALCIUM 600-D) 600-400 MG-UNIT TABS Take 1 tablet by mouth 2 (two) times daily.    [provider]  Cholecalciferol (VITAMIN D-1000 MAX ST) 1000 UNITS tablet Take 1,000  Units by mouth daily.     [provider]  diphenhydrAMINE (BENADRYL) 25 mg capsule Take 25 mg by mouth every 4 (four) hours as needed for itching or allergies.    [provider]  divalproex (DEPAKOTE ER) 500 MG 24 hr tablet TAKE 4 TABLETS (2000MG ) BY MOUTH AT BEDTIME *DO NOT CRUSH* 09/05/17   [name], [name], MD  docusate sodium (COLACE) 100 MG capsule Take 100 mg by mouth daily.     [provider]  furosemide (LASIX) 20 MG  tablet Take 20 mg by mouth daily.     [provider]  gabapentin (NEURONTIN) 100 MG capsule Take 1 capsule (100 mg total) by mouth 3 (three) times daily. 09/05/17   [name], [name], MD  hydrocortisone (ANUSOL-HC) 2.5 % rectal cream Place 1 application rectally 4 (four) times daily as needed for hemorrhoids or anal itching.    [provider]  hydrOXYzine (ATARAX/VISTARIL) 10 MG tablet Take 1 tablet (10 mg total) by mouth 3 (three) times daily as needed. Patient taking differently: Take 10 mg by mouth 3 (three) times daily as needed for itching or anxiety.  02/09/17   Gregor Hams, MD  Insulin Detemir (LEVEMIR FLEXTOUCH) 100 UNIT/ML Pen Inject 15 Units into the skin daily at 10 pm.    [provider]  insulin glargine (LANTUS) 100 UNIT/ML injection Inject 0.12 mLs (12 Units total) into the skin at bedtime. 04/16/15   Hildred Priest, MD  lamoTRIgine (LAMICTAL) 100 MG tablet Take 1 tablet (100 mg total) by mouth at bedtime. 09/05/17   [name], [name], MD  lisinopril (PRINIVIL,ZESTRIL) 5 MG tablet Take 5 mg by mouth daily.     [provider]  loratadine (CLARITIN) 10 MG tablet Take 1 tablet (10 mg total) by mouth daily. 04/30/15   [name], [name], MD  LORazepam (ATIVAN) 0.5 MG tablet Take 1 tablet (0.5 mg total) by mouth 3 (three) times daily. 09/05/17   [name], [name], MD  lurasidone (LATUDA) 40 MG TABS tablet Take 1 tablet (40 mg total) by mouth daily. with food 09/05/17   [name], [name], MD  meloxicam (MOBIC) 15 MG tablet Take 15 mg by mouth daily.    [provider]  Menthol (BIOFREEZE) 10 % AERO Apply 1 application topically as directed.    [provider]  metoprolol succinate (TOPROL-XL) 50 MG 24 hr tablet Take 50 mg by mouth daily.     [provider]  naproxen (NAPROSYN) 500 MG tablet TAKE ONE TABLET BY MOUTH 2 TIMES A DAY AS NEEDED FOR PAIN *TAKE WITH FOOD* 04/21/16   [name], [name], MD  pantoprazole (PROTONIX) 40 MG  tablet Take 40 mg by mouth daily.    [provider]  permethrin (ELIMITE) 5 % cream Apply 1 application topically as directed.    [provider]  polyethylene glycol (MIRALAX / GLYCOLAX) packet Take 17 g by mouth daily.    [provider]  QUEtiapine (SEROQUEL) 100 MG tablet TAKE ONE TABLET BY MOUTH 3 TIMES A DAY TAKE 1 TABLET BY MOUTH DAILY AS NEEDED FOR ANXIETY Patient taking differently: Take 100 mg by mouth See admin instructions. Take 100 mg by mouth 3 times daily. Take 100 mg by mouth daily as needed for anxiety 09/05/17   [name], [name], MD  QUEtiapine (SEROQUEL) 400 MG tablet Take 1 tablet (400 mg total) by mouth at bedtime. 09/05/17   [name], [name], MD  sertraline (ZOLOFT) 100 MG tablet TAKE  TWO TABLETS (200MG ) BY MOUTH ONCE DAILY 09/05/17   [name], [name], MD  simethicone (MYLICON) 80 MG chewable tablet Chew 80-160 mg by mouth 2 (two) times daily as needed for flatulence.    [provider]  simvastatin (ZOCOR) 20 MG tablet Take 20 mg by mouth at bedtime.     [provider]  sitaGLIPtin (JANUVIA) 100 MG tablet Take 100 mg by mouth daily.    [provider]  solifenacin (VESICARE) 5 MG tablet Take 5 mg by mouth daily.    [provider]  Starch (HEMORRHOIDAL RE) Place 1 application rectally 4 (four) times daily as needed (for itching).    [provider]  tamsulosin (FLOMAX) 0.4 MG CAPS capsule Take 0.4 mg by mouth daily.    [provider]  tolnaftate (TINACTIN) 1 % spray Apply 1 application topically daily.    [provider]    Allergies  Allergen Reactions  . Other Other (See Comments)    MYACINS  . Penicillins Nausea And Vomiting  . Cortizone-10 [Hydrocortisone] Rash    Family History  Problem Relation Age of Onset  . Hypertension Mother   . Stroke Father   . Heart Problems Father     Social History Social History   Tobacco Use  . Smoking status: Never Smoker  . Smokeless  tobacco: Never Used  Substance Use Topics  . Alcohol use: No    Alcohol/week: 0.0 standard drinks  . Drug use: No    Review of Systems  Constitutional: Negative for fever. Eyes: Negative for visual changes. ENT: Negative for sore throat. Cardiovascular: Negative for chest pain. Respiratory: Negative for shortness of breath. Gastrointestinal: Negative for abdominal pain, vomiting and diarrhea. Genitourinary: Negative for dysuria. Musculoskeletal: Negative for back pain. Skin: Negative for rash. Neurological: Negative for headache.  ____________________________________________   PHYSICAL EXAM:  VITAL SIGNS: ED Triage Vitals  Enc Vitals Group     BP 04/07/18 1138 (!) 145/82     Pulse Rate 04/07/18 1138 71     Resp 04/07/18 1138 16     Temp 04/07/18 1138 98.3 F (36.8 C)     Temp Source 04/07/18 1138 Oral     SpO2 04/07/18 1138 94 %     Weight 04/07/18 1143 220 lb (99.8 kg)     Height 04/07/18 1143 5\' 7"  (1.702 m)     Head Circumference --      Peak Flow --      Pain Score 04/07/18 1143 0     Pain Loc --      Pain Edu? --      Excl. in [place]? --      Constitutional: Alert and oriented.  HEENT      Head: Normocephalic and atraumatic.      Eyes: Conjunctivae are normal. Pupils equal and round.       Ears:         Nose: No congestion/rhinnorhea.      Mouth/Throat: Mucous membranes are moist.      Neck: No stridor. Cardiovascular/Chest: Normal rate, regular rhythm.  No murmurs, rubs, or gallops. Respiratory: Normal respiratory effort without tachypnea nor retractions. Breath sounds are clear and equal bilaterally. No wheezes/rales/rhonchi. Gastrointestinal: Soft. No distention, no guarding, no rebound. Nontender.    Genitourinary/rectal:Deferred Musculoskeletal: Nontender with normal range of motion in all extremities. No joint effusions.  No lower extremity tenderness.  No edema. Neurologic: Resting tremor arms and legs.  Normal speech and language. No gross or focal  neurologic deficits are appreciated. Skin:  Skin is warm, dry and intact. No rash noted. Psychiatric: Some slight pressured speech and interrupting.  Fair insight.  No active suicidal or homicidal ideation.  Reports depression and anxiety.   ____________________________________________  LABS (pertinent positives/negatives) I, Lisa Roca, MD the attending physician have reviewed the labs noted below.  Labs Reviewed  COMPREHENSIVE METABOLIC PANEL - Abnormal; Notable for the following components:      Result Value   Glucose, Bld 204 (*)    BUN 33 (*)    Calcium 8.6 (*)    All other components within normal limits  ACETAMINOPHEN LEVEL - Abnormal; Notable for the following components:   Acetaminophen (Tylenol), Serum <10 (*)    All other components within normal limits  CBC - Abnormal; Notable for the following components:   RBC 4.20 (*)    Platelets 139 (*)    All other components within normal limits  URINE DRUG SCREEN, QUALITATIVE ([facility] ONLY) - Abnormal; Notable for the following components:   Tricyclic, Ur Screen POSITIVE (*)    All other components within normal limits  GLUCOSE, CAPILLARY - Abnormal; Notable for the following components:   Glucose-Capillary 163 (*)    All other components within normal limits  ETHANOL  SALICYLATE LEVEL    ____________________________________________    EKG I, Lisa Roca, MD, the attending physician have personally viewed and interpreted all ECGs.  None ____________________________________________  RADIOLOGY   None __________________________________________  PROCEDURES  Procedure(s) performed: None  Procedures  Critical Care performed: None   ____________________________________________  ED COURSE / ASSESSMENT AND PLAN  Pertinent labs & imaging results that were available during my care of the patient were reviewed by me and considered in my medical decision making (see chart for details).    Sounds like patient had  verbal outbursts out of frustration and dissatisfaction with the group home, apparently had some vague suicidal ideation there, denies here.  Patient voluntary for consultation with specialist on-call psychiatrist.  Patient okay to move to [facility] to await consultations.    CONSULTATIONS: Specialist on-call psychiatry.  TTS.  Patient / Family / Caregiver informed of clinical course, medical decision-making process, and agree with plan.     ___________________________________________   FINAL CLINICAL IMPRESSION(S) / ED DIAGNOSES   Final diagnoses:  Anxiety  Depression, unspecified depression type  Hyperglycemia      ___________________________________________         Note: This dictation was prepared with Dragon dictation. Any transcriptional errors that result from this process are unintentional    Lisa Roca, MD 04/07/18 1331

## 2018-05-29 ENCOUNTER — Other Ambulatory Visit: Payer: Self-pay

## 2018-05-29 ENCOUNTER — Encounter: Payer: Self-pay | Admitting: Psychiatry

## 2018-05-29 ENCOUNTER — Ambulatory Visit (INDEPENDENT_AMBULATORY_CARE_PROVIDER_SITE_OTHER): Payer: Medicare Other | Admitting: Psychiatry

## 2018-05-29 VITALS — BP 116/72 | HR 68 | Temp 97.8°F | Wt 226.8 lb

## 2018-05-29 DIAGNOSIS — F250 Schizoaffective disorder, bipolar type: Secondary | ICD-10-CM

## 2018-05-29 DIAGNOSIS — F840 Autistic disorder: Secondary | ICD-10-CM | POA: Diagnosis not present

## 2018-05-29 DIAGNOSIS — F259 Schizoaffective disorder, unspecified: Secondary | ICD-10-CM | POA: Diagnosis not present

## 2018-05-29 MED ORDER — LAMOTRIGINE 100 MG PO TABS: 100.0000 mg | ORAL_TABLET | Freq: Every day | ORAL | 10 refills | Status: DC

## 2018-05-29 MED ORDER — SERTRALINE HCL 100 MG PO TABS: ORAL_TABLET | ORAL | 10 refills | Status: DC

## 2018-05-29 MED ORDER — LURASIDONE HCL 40 MG PO TABS: 40.0000 mg | ORAL_TABLET | Freq: Every day | ORAL | 10 refills | Status: DC

## 2018-05-29 MED ORDER — QUETIAPINE FUMARATE 100 MG PO TABS: ORAL_TABLET | ORAL | 10 refills | Status: DC

## 2018-05-29 MED ORDER — LORAZEPAM 0.5 MG PO TABS: 0.5000 mg | ORAL_TABLET | Freq: Three times a day (TID) | ORAL | 4 refills | Status: DC

## 2018-05-29 MED ORDER — GABAPENTIN 100 MG PO CAPS: 100.0000 mg | ORAL_CAPSULE | Freq: Three times a day (TID) | ORAL | 10 refills | Status: DC

## 2018-05-29 MED ORDER — DIVALPROEX SODIUM ER 500 MG PO TB24: ORAL_TABLET | ORAL | 10 refills | Status: DC

## 2018-05-29 MED ORDER — QUETIAPINE FUMARATE 400 MG PO TABS: 400.0000 mg | ORAL_TABLET | Freq: Every day | ORAL | 10 refills | Status: DC

## 2018-05-29 NOTE — Progress Notes (Signed)
Follow-up for this 62 year old man with autistic spectrum disorder and schizoaffective disorder.  Patient seen along with the manager and owner of his group home.  Patient reports that he has been more irritable.  The woman from the group home confirms that he has had several episodes of irritability recently although nothing dramatically different than when he normally has.  As usual he has his complaints about wanting to be able to walk farther away from the group home and has been making a fuss about wanting to move to a different group home despite it having been made clear to him that this is his guardian's decision.  Patient is not reporting psychotic symptoms not reporting suicidal ideation.  Casually dressed.  Adequately cooperative.  Intermittent eye contact.  Seems to have a little bit of twitching in his hands which may represent some TD.  Denies suicidal or homicidal thoughts.  Not reporting any psychotic symptoms.  Patient has some cognitive limitations but is alert and oriented x4 and appears to be at his baseline.  Group home owner request that we consider increasing his as needed Seroquel to 150 mg as needed per day which seems appropriate.  He only needs it occasionally but does need at then for agitation and feels it works.  He is not having any other side effects from his medicine.  Supportive counseling and therapy and review of treatment plan.

## 2018-05-31 ENCOUNTER — Telehealth: Payer: Self-pay

## 2018-05-31 NOTE — Telephone Encounter (Signed)
the pharmacy called states they have a issues with the zoloft and needs to speak with the doctor

## 2018-09-12 ENCOUNTER — Ambulatory Visit (INDEPENDENT_AMBULATORY_CARE_PROVIDER_SITE_OTHER): Payer: Medicare Other

## 2018-09-12 DIAGNOSIS — G4733 Obstructive sleep apnea (adult) (pediatric): Secondary | ICD-10-CM | POA: Diagnosis not present

## 2018-09-12 NOTE — Progress Notes (Signed)
95 percentile pressure 11   95th percentile leak 1.28   apnea index  /hr  apnea-hypopnea index  6.1 /hr   total days used  >4 hr 38 days  total days used <4 hr 52 days  Total compliance 42 percent  He doesn't wear on weekends or when goes to his brothers.

## 2018-11-21 ENCOUNTER — Other Ambulatory Visit: Payer: Self-pay | Admitting: Psychiatry

## 2018-11-21 ENCOUNTER — Telehealth: Payer: Self-pay

## 2018-11-21 MED ORDER — LORAZEPAM 0.5 MG PO TABS: 0.5000 mg | ORAL_TABLET | Freq: Three times a day (TID) | ORAL | 4 refills | Status: DC

## 2018-11-21 NOTE — Telephone Encounter (Signed)
nursing home called pt is completely out of the ativan

## 2018-11-27 NOTE — Telephone Encounter (Signed)
LORazepam (ATIVAN) 0.5 MG tablet  Medication  Date: 11/21/2018 Department: [facility] Ordering/Authorizing: [name], [name], MD  Order Providers   Prescribing Provider Encounter Provider  [name], [name], MD [name], [name], MD  Outpatient Medication Detail    Disp Refills Start End   LORazepam (ATIVAN) 0.5 MG tablet 90 tablet 4 11/21/2018    Sig - Route: Take 1 tablet (0.5 mg total) by mouth 3 (three) times daily. - Oral   Sent to pharmacy as: LORazepam (ATIVAN) 0.5 MG tablet   E-Prescribing Status: Receipt confirmed by pharmacy (11/21/2018 5:52 PM EDT)

## 2018-11-29 ENCOUNTER — Ambulatory Visit: Payer: Medicare Other | Admitting: Psychiatry

## 2018-12-06 ENCOUNTER — Ambulatory Visit: Payer: Self-pay | Admitting: Internal Medicine

## 2018-12-31 ENCOUNTER — Ambulatory Visit: Payer: Self-pay | Admitting: Internal Medicine

## 2019-01-21 ENCOUNTER — Ambulatory Visit (INDEPENDENT_AMBULATORY_CARE_PROVIDER_SITE_OTHER): Payer: Medicare Other | Admitting: Psychiatry

## 2019-01-21 ENCOUNTER — Other Ambulatory Visit: Payer: Self-pay

## 2019-01-21 DIAGNOSIS — F259 Schizoaffective disorder, unspecified: Secondary | ICD-10-CM

## 2019-01-21 DIAGNOSIS — F840 Autistic disorder: Secondary | ICD-10-CM | POA: Diagnosis not present

## 2019-01-21 NOTE — Progress Notes (Signed)
Appointment on the telephone today with this patient for follow-up.  I called the listed telephone number which is his group home.  They stated that he was not there at this time and as far as the person who answered the phone was aware that he was still out with his brother Alexander Beard.  I found a telephone number for Alexander Beard in the chart, [phone].  I telephoned that number and got a voicemail system.  I left a voicemail saying I was just trying to follow-up with Alexander Beard.  Encouraged Alexander Beard the group home or anyone working with Alexander Beard to call the office back at [phone] and get in touch with me.  Reassured them that I have we will keep medicines refilled for now but do want to make sure he is doing okay.

## 2019-01-28 ENCOUNTER — Ambulatory Visit: Payer: Medicare Other | Admitting: Psychiatry

## 2019-01-31 ENCOUNTER — Encounter: Payer: Self-pay | Admitting: Internal Medicine

## 2019-01-31 ENCOUNTER — Ambulatory Visit (INDEPENDENT_AMBULATORY_CARE_PROVIDER_SITE_OTHER): Payer: Medicare Other | Admitting: Internal Medicine

## 2019-01-31 ENCOUNTER — Other Ambulatory Visit: Payer: Self-pay

## 2019-01-31 VITALS — BP 106/70 | HR 62 | Resp 16 | Ht 65.0 in | Wt 218.0 lb

## 2019-01-31 DIAGNOSIS — Z9989 Dependence on other enabling machines and devices: Secondary | ICD-10-CM

## 2019-01-31 DIAGNOSIS — J449 Chronic obstructive pulmonary disease, unspecified: Secondary | ICD-10-CM

## 2019-01-31 DIAGNOSIS — G4733 Obstructive sleep apnea (adult) (pediatric): Secondary | ICD-10-CM

## 2019-01-31 NOTE — Progress Notes (Signed)
[facility] [place], [place] [address]  Pulmonary Sleep Medicine   Office Visit Note  Patient Name: Alexander Beard DOB: Nov 15, 1955 MRN [id number]  Date of Service: 01/31/2019  Complaints/HPI: Pt is here for pulmonary follow up.  Pt is accompanied by staff member from [place] home.  Overall he reports he is doing well.  He wears his cpap nightly, except when he is with his brother, which is usually on the weekends. They are cleaning the machine as directed, and are replacing mask seal, filters and tubing as directed.   ROS  General: (-) fever, (-) chills, (-) night sweats, (-) weakness Skin: (-) rashes, (-) itching,. Eyes: (-) visual changes, (-) redness, (-) itching. Nose and Sinuses: (-) nasal stuffiness or itchiness, (-) postnasal drip, (-) nosebleeds, (-) sinus trouble. Mouth and Throat: (-) sore throat, (-) hoarseness. Neck: (-) swollen glands, (-) enlarged thyroid, (-) neck pain. Respiratory: - cough, (-) bloody sputum, - shortness of breath, - wheezing. Cardiovascular: - ankle swelling, (-) chest pain. Lymphatic: (-) lymph node enlargement. Neurologic: (-) numbness, (-) tingling. Psychiatric: (-) anxiety, (-) depression   Current Medication: Outpatient Encounter Medications as of 01/31/2019  Medication Sig Note  . aspirin EC 81 MG tablet Take 81 mg by mouth daily.   . Calcium Carbonate-Vitamin D3 (CALCIUM 600-D) 600-400 MG-UNIT TABS Take 1 tablet by mouth 2 (two) times daily.   . Cholecalciferol (VITAMIN D-1000 MAX ST) 1000 UNITS tablet Take 1,000 Units by mouth daily.    Marland Kitchen docusate sodium (COLACE) 100 MG capsule Take 100 mg by mouth daily.    . furosemide (LASIX) 20 MG tablet Take 20 mg by mouth daily.    Marland Kitchen gabapentin (NEURONTIN) 100 MG capsule Take 1 capsule (100 mg total) by mouth 3 (three) times daily.   . Insulin Detemir (LEVEMIR FLEXTOUCH) 100 UNIT/ML Pen Inject 15 Units into the skin daily at 10 pm.   . lamoTRIgine (LAMICTAL) 100  MG tablet Take 1 tablet (100 mg total) by mouth at bedtime.   Marland Kitchen loratadine (CLARITIN) 10 MG tablet Take 1 tablet (10 mg total) by mouth daily.   Marland Kitchen lurasidone (LATUDA) 40 MG TABS tablet Take 1 tablet (40 mg total) by mouth daily. with food   . metoprolol succinate (TOPROL-XL) 50 MG 24 hr tablet Take 50 mg by mouth daily.    . NON FORMULARY cpap device   . pantoprazole (PROTONIX) 40 MG tablet Take 40 mg by mouth daily.   . polyethylene glycol (MIRALAX / GLYCOLAX) packet Take 17 g by mouth daily.   . QUEtiapine (SEROQUEL) 100 MG tablet TAKE ONE TABLET BY MOUTH 3 TIMES A DAY TAKE 1 and a half TABLET BY MOUTH DAILY AS NEEDED FOR ANXIETY   . QUEtiapine (SEROQUEL) 400 MG tablet Take 1 tablet (400 mg total) by mouth at bedtime.   . simethicone (MYLICON) 80 MG chewable tablet Chew 80-160 mg by mouth 2 (two) times daily as needed for flatulence.   . simvastatin (ZOCOR) 20 MG tablet Take 20 mg by mouth at bedtime.    . sitaGLIPtin (JANUVIA) 100 MG tablet Take 100 mg by mouth daily.   . solifenacin (VESICARE) 5 MG tablet Take 5 mg by mouth daily.   . Starch (HEMORRHOIDAL RE) Place 1 application rectally 4 (four) times daily as needed (for itching).   . tamsulosin (FLOMAX) 0.4 MG CAPS capsule Take 0.4 mg by mouth daily.   Marland Kitchen tolnaftate (TINACTIN) 1 % spray Apply 1 application topically daily.   Marland Kitchen  acetaminophen (TYLENOL) 650 MG CR tablet Take 650 mg by mouth every 12 (twelve) hours as needed for pain.   . hydrocortisone (ANUSOL-HC) 2.5 % rectal cream Place 1 application rectally 4 (four) times daily as needed for hemorrhoids or anal itching.   Marland Kitchen lisinopril (PRINIVIL,ZESTRIL) 5 MG tablet Take 5 mg by mouth daily.    Marland Kitchen LORazepam (ATIVAN) 0.5 MG tablet Take 1 tablet (0.5 mg total) by mouth 3 (three) times daily.   . meloxicam (MOBIC) 15 MG tablet Take 15 mg by mouth daily.   . Menthol (BIOFREEZE) 10 % AERO Apply 1 application topically as directed. 11/06/2017: Per MAR  . naproxen (NAPROSYN) 500 MG tablet TAKE ONE  TABLET BY MOUTH 2 TIMES A DAY AS NEEDED FOR PAIN *TAKE WITH FOOD*   . permethrin (ELIMITE) 5 % cream Apply 1 application topically as directed. 11/06/2017: Per MAR  . [DISCONTINUED] diphenhydrAMINE (BENADRYL) 25 mg capsule Take 25 mg by mouth every 4 (four) hours as needed for itching or allergies.   . [DISCONTINUED] divalproex (DEPAKOTE ER) 500 MG 24 hr tablet TAKE 4 TABLETS (2000MG ) BY MOUTH AT BEDTIME *DO NOT CRUSH* (Patient not taking: Reported on 01/31/2019)   . [DISCONTINUED] hydrOXYzine (ATARAX/VISTARIL) 10 MG tablet Take 1 tablet (10 mg total) by mouth 3 (three) times daily as needed. (Patient not taking: Reported on 01/31/2019)   . [DISCONTINUED] insulin glargine (LANTUS) 100 UNIT/ML injection Inject 0.12 mLs (12 Units total) into the skin at bedtime. (Patient not taking: Reported on 01/31/2019)    No facility-administered encounter medications on file as of 01/31/2019.     Surgical History: Past Surgical History:  Procedure Laterality Date  . CATARACT EXTRACTION W/PHACO Left 11/14/2017   Procedure: CATARACT EXTRACTION PHACO AND INTRAOCULAR LENS PLACEMENT (IOC);  Surgeon: Birder Robson, MD;  Location: [facility] ORS;  Service: Ophthalmology;  Laterality: Left;  [place] 00:42 AP% 14.6 CDE 6.12 Fluid pack lot # [id number]  . COLONOSCOPY  01/28/2005  . COLONOSCOPY WITH PROPOFOL N/A 05/09/2016   Procedure: COLONOSCOPY WITH PROPOFOL;  Surgeon: Manya Silvas, MD;  Location: [facility] ENDOSCOPY;  Service: Endoscopy;  Laterality: N/A;  . ESOPHAGOGASTRODUODENOSCOPY N/A 05/09/2016   Procedure: ESOPHAGOGASTRODUODENOSCOPY (EGD);  Surgeon: Manya Silvas, MD;  Location: [facility] ENDOSCOPY;  Service: Endoscopy;  Laterality: N/A;  . FRACTURE SURGERY     ORIF SHOULDER  . TONSILLECTOMY      Medical History: Past Medical History:  Diagnosis Date  . Anxiety   . Anxiety disorder due to known physiological condition    HOSPITALIZED 10/18  . Arthritis   . Autistic disorder, residual state   . COPD (chronic  obstructive pulmonary disease) ([place])   . Depression   . Developmental disorder   . Diabetes mellitus without complication ([place])   . Dyslipidemia   . Esophageal reflux   . HOH (hard of hearing)    MILDLY  . Hypertension   . Obesity   . Overactive bladder   . Palpitations    ANXIETY  . Schizophrenia, schizoaffective ([place])   . Sleep apnea   . Tremors of nervous system    HANDS DUE TO MEDICATIONS  . Urinary incontinence     Family History: Family History  Problem Relation Age of Onset  . Hypertension Mother   . Stroke Father   . Heart Problems Father     Social History: Social History   Socioeconomic History  . Marital status: Single    Spouse name: Not on file  . Number of children: Not on file  .  Years of education: Not on file  . Highest education level: Not on file  Occupational History  . Not on file  Social Needs  . Financial resource strain: Not on file  . Food insecurity    Worry: Not on file    Inability: Not on file  . Transportation needs    Medical: Not on file    Non-medical: Not on file  Tobacco Use  . Smoking status: Never Smoker  . Smokeless tobacco: Never Used  Substance and Sexual Activity  . Alcohol use: No    Alcohol/week: 0.0 standard drinks  . Drug use: No  . Sexual activity: Never  Lifestyle  . Physical activity    Days per week: Not on file    Minutes per session: Not on file  . Stress: Not on file  Relationships  . Social Herbalist on phone: Not on file    Gets together: Not on file    Attends religious service: Not on file    Active member of club or organization: Not on file    Attends meetings of clubs or organizations: Not on file    Relationship status: Not on file  . Intimate partner violence    Fear of current or ex partner: Not on file    Emotionally abused: Not on file    Physically abused: Not on file    Forced sexual activity: Not on file  Other Topics Concern  . Not on file  Social History Narrative    The patient never finished high school but did get his GED. 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 He is currently in disability and [name] is his legal guardian. He has been living in group homes for many years.      No pending legal charges    Vital Signs: Blood pressure 106/70, pulse 62, resp. rate 16, height 5\' 5"  (1.651 m), weight 218 lb (98.9 kg), SpO2 97 %.  Examination: General Appearance: The patient is well-developed, well-nourished, and in no distress. Skin: Gross inspection of skin unremarkable. Head: normocephalic, no gross deformities. Eyes: no gross deformities noted. ENT: ears appear grossly normal no exudates. Neck: Supple. No thyromegaly. No LAD. Respiratory: clear bilateraly. Cardiovascular: Normal S1 and S2 without murmur or rub. Extremities: No cyanosis. pulses are equal. Neurologic: Alert and oriented. No involuntary movements.  LABS: No results found for this or any previous visit (from the past 2160 hour(s)).  Radiology: No results found.  No results found.  No results found.    Assessment and Plan: Patient Active Problem List   Diagnosis Date Noted  . Diabetes ([place]) 04/10/2015  . Hypertension 04/10/2015  . Prostate hypertrophy 04/10/2015  . Schizoaffective disorder ([place]) 04/10/2015  . Enlarged prostate 04/10/2015  . Essential (primary) hypertension 04/10/2015  . Type 2 diabetes mellitus ([place]) 04/10/2015  . Controlled type 2 diabetes mellitus without complication ([place]) 51/76/1607  . Schizoaffective disorder, bipolar type ([place]) 12/29/2014  . Active autistic disorder 12/29/2014    1. OSA on CPAP Continue to wear CPAP as discussed.  Continue to change filters and tubing as necessary.  Encourage patient to take CPAP machine with him to his brothers on the weekends and that the more often he wears it the better it would be.  2. Obstructive chronic bronchitis without  exacerbation ([place]) Stable, continue present management.  Continue to use inhalers as directed.  General Counseling: I  have discussed the findings of the evaluation and examination with Alexander Beard.  I have also discussed any further diagnostic evaluation thatmay be needed or ordered today. Alexander Beard verbalizes understanding of the findings of todays visit. We also reviewed his medications today and discussed drug interactions and side effects including but not limited excessive drowsiness and altered mental states. We also discussed that there is always a risk not just to him but also people around him. he has been encouraged to call the office with any questions or concerns that should arise related to todays visit.    Time spent: 15 This patient was seen by Orson Gear AGNP-C in Collaboration with Dr. Devona Konig as a part of collaborative care agreement.   I have personally obtained a history, examined the patient, evaluated laboratory and imaging results, formulated the assessment and plan and placed orders.    [name], MD [facility] Pulmonary and Critical Care Sleep medicine

## 2019-02-26 ENCOUNTER — Ambulatory Visit (INDEPENDENT_AMBULATORY_CARE_PROVIDER_SITE_OTHER): Payer: Medicare Other | Admitting: Psychiatry

## 2019-02-26 ENCOUNTER — Encounter: Payer: Self-pay | Admitting: Psychiatry

## 2019-02-26 ENCOUNTER — Other Ambulatory Visit: Payer: Self-pay

## 2019-02-26 DIAGNOSIS — F840 Autistic disorder: Secondary | ICD-10-CM | POA: Diagnosis not present

## 2019-02-26 DIAGNOSIS — F259 Schizoaffective disorder, unspecified: Secondary | ICD-10-CM | POA: Diagnosis not present

## 2019-02-26 MED ORDER — LAMOTRIGINE 100 MG PO TABS: 100.0000 mg | ORAL_TABLET | Freq: Every day | ORAL | 10 refills | Status: DC

## 2019-02-26 MED ORDER — QUETIAPINE FUMARATE 100 MG PO TABS: ORAL_TABLET | ORAL | 10 refills | Status: DC

## 2019-02-26 MED ORDER — LURASIDONE HCL 40 MG PO TABS: 40.0000 mg | ORAL_TABLET | Freq: Every day | ORAL | 10 refills | Status: DC

## 2019-02-26 MED ORDER — LORAZEPAM 0.5 MG PO TABS: 0.5000 mg | ORAL_TABLET | Freq: Three times a day (TID) | ORAL | 4 refills | Status: DC

## 2019-02-26 MED ORDER — QUETIAPINE FUMARATE 400 MG PO TABS: 400.0000 mg | ORAL_TABLET | Freq: Every day | ORAL | 10 refills | Status: DC

## 2019-02-26 NOTE — Progress Notes (Signed)
Follow-up for this patient with developmental disability and a diagnosis of schizoaffective disorder.  Spoke with his brother.  Patient is spending what sounds like may be about half of the week with his brother these days.  Behavior has been pretty good.  Rarely having any outbursts.  Alexander Beard mentions that he thinks that Alexander Beard is doing a better job at controlling the outbursts when they do happen.  No new medical issues.  Did not speak to [facility] directly as we both judge that it would be better not to get him upset.  All medications seem to be stable.  I will make sure things are refilled as necessary.  I am always happy to talk with Alexander Beard for Alexander Beard or be of help we can make an appointment in 6 months probably.

## 2019-03-20 ENCOUNTER — Ambulatory Visit: Payer: Self-pay

## 2019-04-24 ENCOUNTER — Other Ambulatory Visit: Payer: Self-pay | Admitting: Psychiatry

## 2019-04-24 MED ORDER — LORAZEPAM 0.5 MG PO TABS: 0.5000 mg | ORAL_TABLET | Freq: Three times a day (TID) | ORAL | 4 refills | Status: DC

## 2019-04-25 ENCOUNTER — Other Ambulatory Visit: Payer: Self-pay | Admitting: Psychiatry

## 2019-07-18 ENCOUNTER — Telehealth: Payer: Self-pay

## 2019-07-18 NOTE — Telephone Encounter (Signed)
Called lmom informing patient of appointment. klh 

## 2019-07-22 ENCOUNTER — Other Ambulatory Visit: Payer: Self-pay

## 2019-07-22 ENCOUNTER — Ambulatory Visit (INDEPENDENT_AMBULATORY_CARE_PROVIDER_SITE_OTHER): Payer: Medicare Other | Admitting: Internal Medicine

## 2019-07-22 ENCOUNTER — Encounter: Payer: Self-pay | Admitting: Internal Medicine

## 2019-07-22 VITALS — BP 122/77 | HR 60 | Temp 97.9°F | Resp 16 | Ht 65.0 in | Wt 230.0 lb

## 2019-07-22 DIAGNOSIS — G4733 Obstructive sleep apnea (adult) (pediatric): Secondary | ICD-10-CM | POA: Diagnosis not present

## 2019-07-22 DIAGNOSIS — J449 Chronic obstructive pulmonary disease, unspecified: Secondary | ICD-10-CM

## 2019-07-22 DIAGNOSIS — F250 Schizoaffective disorder, bipolar type: Secondary | ICD-10-CM

## 2019-07-22 DIAGNOSIS — R251 Tremor, unspecified: Secondary | ICD-10-CM

## 2019-07-22 NOTE — Progress Notes (Addendum)
[facility], [place] [address]  Pulmonary Sleep Medicine   Office Visit Note  Patient Name: Alexander Beard DOB: 1955/08/15 MRN [id number]  Date of Service: 07/22/2019  Complaints/HPI: OSA patient is here for follow-up of sleep apnea.  Is here with his mother apparently is using the machine but not to the full extent that he should be using CPAP device.  Had a very lengthy discussion with him as well as his brother about using the CPAP device to gain full benefit.  The brother states that because of his underlying psychiatric illness the patient does have difficulty using the machine.  As far as his breathing is concerned that this does appear to be under control.  Has not had any admissions to the hospital.  Schizoaffective disorder follow-up with his own primary psychiatry team.  ROS  General: (-) fever, (-) chills, (-) night sweats, (-) weakness Skin: (-) rashes, (-) itching,. Eyes: (-) visual changes, (-) redness, (-) itching. Nose and Sinuses: (-) nasal stuffiness or itchiness, (-) postnasal drip, (-) nosebleeds, (-) sinus trouble. Mouth and Throat: (-) sore throat, (-) hoarseness. Neck: (-) swollen glands, (-) enlarged thyroid, (-) neck pain. Respiratory: - cough, (-) bloody sputum, - shortness of breath, - wheezing. Cardiovascular: - ankle swelling, (-) chest pain. Lymphatic: (-) lymph node enlargement. Neurologic: (-) numbness, (-) tingling. Psychiatric: (-) anxiety, (-) depression   Current Medication: Outpatient Encounter Medications as of 07/22/2019  Medication Sig Note  . acetaminophen (TYLENOL) 650 MG CR tablet Take 650 mg by mouth every 12 (twelve) hours as needed for pain.   Marland Kitchen aspirin EC 81 MG tablet Take 81 mg by mouth daily.   . Calcium Carbonate-Vitamin D3 (CALCIUM 600-D) 600-400 MG-UNIT TABS Take 1 tablet by mouth 2 (two) times daily.   . Cholecalciferol (VITAMIN D-1000 MAX ST) 1000 UNITS tablet Take 1,000 Units by mouth daily.     Marland Kitchen docusate sodium (COLACE) 100 MG capsule Take 100 mg by mouth daily.    . furosemide (LASIX) 20 MG tablet Take 20 mg by mouth daily.    Marland Kitchen gabapentin (NEURONTIN) 100 MG capsule Take 1 capsule (100 mg total) by mouth 3 (three) times daily.   . hydrocortisone (ANUSOL-HC) 2.5 % rectal cream Place 1 application rectally 4 (four) times daily as needed for hemorrhoids or anal itching.   . Insulin Detemir (LEVEMIR FLEXTOUCH) 100 UNIT/ML Pen Inject 15 Units into the skin daily at 10 pm.   . lamoTRIgine (LAMICTAL) 100 MG tablet Take 1 tablet (100 mg total) by mouth at bedtime.   Marland Kitchen lisinopril (PRINIVIL,ZESTRIL) 5 MG tablet Take 5 mg by mouth daily.    Marland Kitchen loratadine (CLARITIN) 10 MG tablet Take 1 tablet (10 mg total) by mouth daily.   Marland Kitchen LORazepam (ATIVAN) 0.5 MG tablet Take 1 tablet (0.5 mg total) by mouth 3 (three) times daily.   Marland Kitchen lurasidone (LATUDA) 40 MG TABS tablet Take 1 tablet (40 mg total) by mouth daily. with food   . meloxicam (MOBIC) 15 MG tablet Take 15 mg by mouth daily.   . Menthol (BIOFREEZE) 10 % AERO Apply 1 application topically as directed. 11/06/2017: Per MAR  . metoprolol succinate (TOPROL-XL) 50 MG 24 hr tablet Take 50 mg by mouth daily.    . naproxen (NAPROSYN) 500 MG tablet TAKE ONE TABLET BY MOUTH 2 TIMES A DAY AS NEEDED FOR PAIN *TAKE WITH FOOD*   . NON FORMULARY cpap device   . pantoprazole (PROTONIX) 40 MG tablet Take  40 mg by mouth daily.   . permethrin (ELIMITE) 5 % cream Apply 1 application topically as directed. 11/06/2017: Per MAR  . polyethylene glycol (MIRALAX / GLYCOLAX) packet Take 17 g by mouth daily.   . QUEtiapine (SEROQUEL) 100 MG tablet TAKE ONE TABLET BY MOUTH 3 TIMES A DAY TAKE 1 and a half TABLET BY MOUTH DAILY AS NEEDED FOR ANXIETY   . QUEtiapine (SEROQUEL) 400 MG tablet Take 1 tablet (400 mg total) by mouth at bedtime.   . simethicone (MYLICON) 80 MG chewable tablet Chew 80-160 mg by mouth 2 (two) times daily as needed for flatulence.   . simvastatin (ZOCOR) 20  MG tablet Take 20 mg by mouth at bedtime.    . sitaGLIPtin (JANUVIA) 100 MG tablet Take 100 mg by mouth daily.   . solifenacin (VESICARE) 5 MG tablet Take 5 mg by mouth daily.   . Starch (HEMORRHOIDAL RE) Place 1 application rectally 4 (four) times daily as needed (for itching).   . tamsulosin (FLOMAX) 0.4 MG CAPS capsule Take 0.4 mg by mouth daily.   Marland Kitchen tolnaftate (TINACTIN) 1 % spray Apply 1 application topically daily.    No facility-administered encounter medications on file as of 07/22/2019.    Surgical History: Past Surgical History:  Procedure Laterality Date  . CATARACT EXTRACTION W/PHACO Left 11/14/2017   Procedure: CATARACT EXTRACTION PHACO AND INTRAOCULAR LENS PLACEMENT (IOC);  Surgeon: Birder Robson, MD;  Location: [facility] ORS;  Service: Ophthalmology;  Laterality: Left;  [place] 00:42 AP% 14.6 CDE 6.12 Fluid pack lot # [id number]  . COLONOSCOPY  01/28/2005  . COLONOSCOPY WITH PROPOFOL N/A 05/09/2016   Procedure: COLONOSCOPY WITH PROPOFOL;  Surgeon: Manya Silvas, MD;  Location: [facility] ENDOSCOPY;  Service: Endoscopy;  Laterality: N/A;  . ESOPHAGOGASTRODUODENOSCOPY N/A 05/09/2016   Procedure: ESOPHAGOGASTRODUODENOSCOPY (EGD);  Surgeon: Manya Silvas, MD;  Location: [facility] ENDOSCOPY;  Service: Endoscopy;  Laterality: N/A;  . FRACTURE SURGERY     ORIF SHOULDER  . TONSILLECTOMY      Medical History: Past Medical History:  Diagnosis Date  . Anxiety   . Anxiety disorder due to known physiological condition    HOSPITALIZED 10/18  . Arthritis   . Autistic disorder, residual state   . COPD (chronic obstructive pulmonary disease) ([place])   . Depression   . Developmental disorder   . Diabetes mellitus without complication ([place])   . Dyslipidemia   . Esophageal reflux   . HOH (hard of hearing)    MILDLY  . Hypertension   . Obesity   . Overactive bladder   . Palpitations    ANXIETY  . Schizophrenia, schizoaffective ([place])   . Sleep apnea   . Tremors of nervous system    HANDS  DUE TO MEDICATIONS  . Urinary incontinence     Family History: Family History  Problem Relation Age of Onset  . Hypertension Mother   . Stroke Father   . Heart Problems Father     Social History: Social History   Socioeconomic History  . Marital status: Single    Spouse name: Not on file  . Number of children: Not on file  . Years of education: Not on file  . Highest education level: Not on file  Occupational History  . Not on file  Tobacco Use  . Smoking status: Never Smoker  . Smokeless tobacco: Never Used  Substance and Sexual Activity  . Alcohol use: No    Alcohol/week: 0.0 standard drinks  . Drug use: No  .  Sexual activity: Never  Other Topics Concern  . Not on file  Social History Narrative   The patient never finished high school but did get his GED. 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 He is currently in disability and his brother Alexander Beard is his legal guardian. He has been living in group homes for many years.      No pending legal charges   Social Determinants of Health   Financial Resource Strain:   . Difficulty of Paying Living Expenses: Not on file  Food Insecurity:   . Worried About Charity fundraiser in the Last Year: Not on file  . Ran Out of Food in the Last Year: Not on file  Transportation Needs:   . Lack of Transportation (Medical): Not on file  . Lack of Transportation (Non-Medical): Not on file  Physical Activity:   . Days of Exercise per Week: Not on file  . Minutes of Exercise per Session: Not on file  Stress:   . Feeling of Stress : Not on file  Social Connections:   . Frequency of Communication with Friends and Family: Not on file  . Frequency of Social Gatherings with Friends and Family: Not on file  . Attends Religious Services: Not on file  . Active Member of Clubs or Organizations: Not on file  . Attends Archivist Meetings: Not on file  . Marital Status:  Not on file  Intimate Partner Violence:   . Fear of Current or Ex-Partner: Not on file  . Emotionally Abused: Not on file  . Physically Abused: Not on file  . Sexually Abused: Not on file    Vital Signs: Blood pressure 122/77, pulse 60, temperature 97.9 F (36.6 C), resp. rate 16, height 5\' 5"  (1.651 m), weight 230 lb (104.3 kg), SpO2 95 %.  Examination: General Appearance: The patient is well-developed, well-nourished, and in no distress. Skin: Gross inspection of skin unremarkable. Head: normocephalic, no gross deformities. Eyes: no gross deformities noted. ENT: ears appear grossly normal no exudates. Neck: Supple. No thyromegaly. No LAD. Respiratory: no rhonchi . Cardiovascular: Normal S1 and S2 without murmur or rub. Extremities: No cyanosis. pulses are equal. Neurologic: Alert and oriented. No involuntary movements.  LABS: No results found for this or any previous visit (from the past 2160 hour(s)).  Radiology: No results found.  No results found.  No results found.    Assessment and Plan: Patient Active Problem List   Diagnosis Date Noted  . Diabetes ([place]) 04/10/2015  . Hypertension 04/10/2015  . Prostate hypertrophy 04/10/2015  . Schizoaffective disorder ([place]) 04/10/2015  . Enlarged prostate 04/10/2015  . Essential (primary) hypertension 04/10/2015  . Type 2 diabetes mellitus ([place]) 04/10/2015  . Controlled type 2 diabetes mellitus without complication ([place]) 0000000  . Schizoaffective disorder, bipolar type ([place]) 12/29/2014  . Active autistic disorder 12/29/2014    1. OSA compliance is around 42% during the last download period. He has an AHI of 6.1/hr also 2. COPD he states he is not using any inhalers. Not smoking at this time. He states that he has some second hand smoke exposures 3. Schizoaffective disorder lives in group home has been living with his brother over the weekends. Apparently he does not use the CPAP when he is at the house.   4. Tremor  General Counseling: I have discussed the findings of the evaluation and examination with Alexander Beard.  I have also discussed  any further diagnostic evaluation thatmay be needed or ordered today. Bry verbalizes understanding of the findings of todays visit. We also reviewed his medications today and discussed drug interactions and side effects including but not limited excessive drowsiness and altered mental states. We also discussed that there is always a risk not just to him but also people around him. he has been encouraged to call the office with any questions or concerns that should arise related to todays visit.  Orders Placed This Encounter  Procedures  . DG Chest 2 View    Standing Status:   Future    Standing Expiration Date:   09/18/2020    Order Specific Question:   Reason for Exam (SYMPTOM  OR DIAGNOSIS REQUIRED)    Answer:   copd    Order Specific Question:   Preferred imaging location?    Answer:   [facility]    Order Specific Question:   Radiology Contrast Protocol - do NOT remove file path    Answer:   [email or web address]  . Pulmonary function test    Standing Status:   Future    Standing Expiration Date:   07/21/2020    Order Specific Question:   Where should this test be performed?    Answer:   [facility]     Time spent: 56min  I have personally obtained a history, examined the patient, evaluated laboratory and imaging results, formulated the assessment and plan and placed orders.    [name], MD [facility] Pulmonary and Critical Care Sleep medicine

## 2019-07-22 NOTE — Patient Instructions (Signed)

## 2019-08-26 ENCOUNTER — Other Ambulatory Visit: Payer: Self-pay | Admitting: Psychiatry

## 2019-08-26 MED ORDER — SERTRALINE HCL 100 MG PO TABS: 100.0000 mg | ORAL_TABLET | Freq: Every day | ORAL | 6 refills | Status: DC

## 2019-09-02 ENCOUNTER — Telehealth: Payer: Self-pay

## 2019-09-02 NOTE — Telephone Encounter (Signed)
the pharmacy needs you to contact them in regards to patient medications.

## 2019-09-19 ENCOUNTER — Telehealth: Payer: Self-pay

## 2019-09-19 ENCOUNTER — Other Ambulatory Visit: Payer: Self-pay | Admitting: Psychiatry

## 2019-09-19 ENCOUNTER — Other Ambulatory Visit: Payer: Self-pay

## 2019-09-19 MED ORDER — LORAZEPAM 0.5 MG PO TABS: 0.5000 mg | ORAL_TABLET | Freq: Three times a day (TID) | ORAL | 5 refills | Status: DC

## 2019-09-19 MED ORDER — QUETIAPINE FUMARATE 100 MG PO TABS: ORAL_TABLET | ORAL | 10 refills | Status: DC

## 2019-09-19 MED ORDER — SERTRALINE HCL 100 MG PO TABS: 100.0000 mg | ORAL_TABLET | Freq: Every day | ORAL | 6 refills | Status: DC

## 2019-09-19 MED ORDER — LAMOTRIGINE 100 MG PO TABS: 100.0000 mg | ORAL_TABLET | Freq: Every day | ORAL | 10 refills | Status: DC

## 2019-09-19 MED ORDER — SERTRALINE HCL 100 MG PO TABS: 200.0000 mg | ORAL_TABLET | Freq: Every day | ORAL | 5 refills | Status: DC

## 2019-09-19 MED ORDER — SERTRALINE HCL 50 MG PO TABS: 100.0000 mg | ORAL_TABLET | Freq: Every day | ORAL | 5 refills | Status: DC

## 2019-09-19 MED ORDER — LURASIDONE HCL 40 MG PO TABS: 40.0000 mg | ORAL_TABLET | Freq: Every day | ORAL | 10 refills | Status: DC

## 2019-09-19 MED ORDER — GABAPENTIN 100 MG PO CAPS: 100.0000 mg | ORAL_CAPSULE | Freq: Three times a day (TID) | ORAL | 10 refills | Status: DC

## 2019-09-19 MED ORDER — QUETIAPINE FUMARATE 400 MG PO TABS: 400.0000 mg | ORAL_TABLET | Freq: Every day | ORAL | 10 refills | Status: DC

## 2019-09-19 NOTE — Telephone Encounter (Signed)
Pharmacy called on clarification for patient's medications. The pharmacy needs you to contact them asap. Thank you.

## 2019-09-20 NOTE — Telephone Encounter (Signed)
Thank you, this is all been taken care of

## 2019-10-07 ENCOUNTER — Ambulatory Visit: Payer: Medicare Other | Admitting: Urology

## 2019-10-07 ENCOUNTER — Encounter: Payer: Self-pay | Admitting: Urology

## 2019-11-12 ENCOUNTER — Other Ambulatory Visit: Payer: Self-pay | Admitting: Psychiatry

## 2019-12-04 ENCOUNTER — Ambulatory Visit (INDEPENDENT_AMBULATORY_CARE_PROVIDER_SITE_OTHER): Payer: Medicare Other | Admitting: Internal Medicine

## 2019-12-04 ENCOUNTER — Other Ambulatory Visit: Payer: Self-pay

## 2019-12-04 DIAGNOSIS — R0602 Shortness of breath: Secondary | ICD-10-CM | POA: Diagnosis not present

## 2019-12-04 LAB — PULMONARY FUNCTION TEST

## 2019-12-09 ENCOUNTER — Emergency Department: Payer: Medicare Other

## 2019-12-09 ENCOUNTER — Emergency Department
Admission: EM | Admit: 2019-12-09 | Discharge: 2019-12-09 | Disposition: A | Discharge status: Home / Self Care | Payer: Medicare Other | Attending: Student in an Organized Health Care Education/Training Program | Admitting: Student in an Organized Health Care Education/Training Program

## 2019-12-09 ENCOUNTER — Encounter: Payer: Self-pay | Admitting: Emergency Medicine

## 2019-12-09 ENCOUNTER — Other Ambulatory Visit: Payer: Self-pay

## 2019-12-09 DIAGNOSIS — Y92009 Unspecified place in unspecified non-institutional (private) residence as the place of occurrence of the external cause: Secondary | ICD-10-CM

## 2019-12-09 DIAGNOSIS — I10 Essential (primary) hypertension: Secondary | ICD-10-CM | POA: Diagnosis not present

## 2019-12-09 DIAGNOSIS — S0001XA Abrasion of scalp, initial encounter: Secondary | ICD-10-CM | POA: Diagnosis not present

## 2019-12-09 DIAGNOSIS — Y92129 Unspecified place in nursing home as the place of occurrence of the external cause: Secondary | ICD-10-CM | POA: Diagnosis not present

## 2019-12-09 DIAGNOSIS — Y999 Unspecified external cause status: Secondary | ICD-10-CM | POA: Diagnosis not present

## 2019-12-09 DIAGNOSIS — Z79899 Other long term (current) drug therapy: Secondary | ICD-10-CM | POA: Insufficient documentation

## 2019-12-09 DIAGNOSIS — S0990XA Unspecified injury of head, initial encounter: Secondary | ICD-10-CM | POA: Diagnosis present

## 2019-12-09 DIAGNOSIS — E119 Type 2 diabetes mellitus without complications: Secondary | ICD-10-CM | POA: Diagnosis not present

## 2019-12-09 DIAGNOSIS — Y939 Activity, unspecified: Secondary | ICD-10-CM | POA: Insufficient documentation

## 2019-12-09 DIAGNOSIS — W010XXA Fall on same level from slipping, tripping and stumbling without subsequent striking against object, initial encounter: Secondary | ICD-10-CM | POA: Diagnosis not present

## 2019-12-09 DIAGNOSIS — Z794 Long term (current) use of insulin: Secondary | ICD-10-CM | POA: Insufficient documentation

## 2019-12-09 DIAGNOSIS — Z7982 Long term (current) use of aspirin: Secondary | ICD-10-CM | POA: Insufficient documentation

## 2019-12-09 MED ORDER — BACITRACIN-NEOMYCIN-POLYMYXIN 400-5-5000 EX OINT: TOPICAL_OINTMENT | Freq: Once | CUTANEOUS | Status: AC

## 2019-12-09 NOTE — ED Triage Notes (Signed)
Brought in vis EMS  S/p fall  States he slipped  Fell  small laceration noted to right side of head  No LOC  Denies any pain

## 2019-12-09 NOTE — Discharge Instructions (Addendum)
No acute findings on CT of the head.  Follow discharge care instruction continue previous medications.  Advised to follow-up with the group home administration office to address your issues of your care at the facility.

## 2019-12-09 NOTE — ED Notes (Signed)
I spoke with group home owner and pt 's brother.  The group home with send someone to pick him up

## 2019-12-09 NOTE — ED Provider Notes (Signed)
[facility] Emergency Department Provider Note   ____________________________________________   First MD Initiated Contact with Patient 12/09/19 1153     (approximate)  I have reviewed the triage vital signs and the nursing notes.   HISTORY  Chief Complaint Fall    HPI Alexander Beard is a 64 y.o. male patient arrived via EMS secondary to a slip and fall at nursing home.  Patient sustained a small laceration to the right side of his head.  Denies LOC.  Denies headache, vision disturbance, or vertigo.  Patient voices concerns about his care at the group home facilities.  Patient also states he wishes to contact his brother who is an undisclosed hospital when shoulder.  Unable to verify these complaints on this visit.         Past Medical History:  Diagnosis Date  . Anxiety   . Anxiety disorder due to known physiological condition    HOSPITALIZED 10/18  . Arthritis   . Autistic disorder, residual state   . COPD (chronic obstructive pulmonary disease) ([place])   . Depression   . Developmental disorder   . Diabetes mellitus without complication ([place])   . Dyslipidemia   . Esophageal reflux   . HOH (hard of hearing)    MILDLY  . Hypertension   . Obesity   . Overactive bladder   . Palpitations    ANXIETY  . Schizophrenia, schizoaffective ([place])   . Sleep apnea   . Tremors of nervous system    HANDS DUE TO MEDICATIONS  . Urinary incontinence     Patient Active Problem List   Diagnosis Date Noted  . Diabetes ([place]) 04/10/2015  . Hypertension 04/10/2015  . Prostate hypertrophy 04/10/2015  . Schizoaffective disorder ([place]) 04/10/2015  . Enlarged prostate 04/10/2015  . Essential (primary) hypertension 04/10/2015  . Type 2 diabetes mellitus ([place]) 04/10/2015  . Controlled type 2 diabetes mellitus without complication ([place]) 0000000  . Schizoaffective disorder, bipolar type ([place]) 12/29/2014  . Active autistic disorder 12/29/2014    Past Surgical  History:  Procedure Laterality Date  . CATARACT EXTRACTION W/PHACO Left 11/14/2017   Procedure: CATARACT EXTRACTION PHACO AND INTRAOCULAR LENS PLACEMENT (IOC);  Surgeon: Birder Robson, MD;  Location: [facility] ORS;  Service: Ophthalmology;  Laterality: Left;  [place] 00:42 AP% 14.6 CDE 6.12 Fluid pack lot # [id number]  . COLONOSCOPY  01/28/2005  . COLONOSCOPY WITH PROPOFOL N/A 05/09/2016   Procedure: COLONOSCOPY WITH PROPOFOL;  Surgeon: Manya Silvas, MD;  Location: [facility] ENDOSCOPY;  Service: Endoscopy;  Laterality: N/A;  . ESOPHAGOGASTRODUODENOSCOPY N/A 05/09/2016   Procedure: ESOPHAGOGASTRODUODENOSCOPY (EGD);  Surgeon: Manya Silvas, MD;  Location: [facility] ENDOSCOPY;  Service: Endoscopy;  Laterality: N/A;  . FRACTURE SURGERY     ORIF SHOULDER  . TONSILLECTOMY      Prior to Admission medications   Medication Sig Start Date End Date Taking? Authorizing Provider  acetaminophen (TYLENOL) 650 MG CR tablet Take 650 mg by mouth every 12 (twelve) hours as needed for pain.    [provider]  aspirin EC 81 MG tablet Take 81 mg by mouth daily.    [provider]  Calcium Carbonate-Vitamin D3 (CALCIUM 600-D) 600-400 MG-UNIT TABS Take 1 tablet by mouth 2 (two) times daily.    [provider]  Cholecalciferol (VITAMIN D-1000 MAX ST) 1000 UNITS tablet Take 1,000 Units by mouth daily.     [provider]  docusate sodium (COLACE) 100 MG capsule Take 100 mg by mouth daily.  [provider]  furosemide (LASIX) 20 MG tablet Take 20 mg by mouth daily.     [provider]  gabapentin (NEURONTIN) 100 MG capsule Take 1 capsule (100 mg total) by mouth 3 (three) times daily. 09/19/19   [name], [name], MD  hydrocortisone (ANUSOL-HC) 2.5 % rectal cream Place 1 application rectally 4 (four) times daily as needed for hemorrhoids or anal itching.    [provider]  Insulin Detemir (LEVEMIR FLEXTOUCH) 100 UNIT/ML Pen Inject 15 Units into the skin daily at 10  pm.    [provider]  lamoTRIgine (LAMICTAL) 100 MG tablet Take 1 tablet (100 mg total) by mouth at bedtime. 09/19/19   [name], [name], MD  lisinopril (PRINIVIL,ZESTRIL) 5 MG tablet Take 5 mg by mouth daily.     [provider]  loratadine (CLARITIN) 10 MG tablet Take 1 tablet (10 mg total) by mouth daily. 04/30/15   [name], [name], MD  LORazepam (ATIVAN) 0.5 MG tablet Take 1 tablet (0.5 mg total) by mouth 3 (three) times daily. 09/19/19   [name], [name], MD  lurasidone (LATUDA) 40 MG TABS tablet Take 1 tablet (40 mg total) by mouth daily. with food 09/19/19   [name], [name], MD  meloxicam (MOBIC) 15 MG tablet Take 15 mg by mouth daily.    [provider]  Menthol (BIOFREEZE) 10 % AERO Apply 1 application topically as directed.    [provider]  metoprolol succinate (TOPROL-XL) 50 MG 24 hr tablet Take 50 mg by mouth daily.     [provider]  naproxen (NAPROSYN) 500 MG tablet TAKE ONE TABLET BY MOUTH 2 TIMES A DAY AS NEEDED FOR PAIN *TAKE WITH FOOD* 04/21/16   [name], [name], MD  NON FORMULARY cpap device    [provider]  pantoprazole (PROTONIX) 40 MG tablet Take 40 mg by mouth daily.    [provider]  permethrin (ELIMITE) 5 % cream Apply 1 application topically as directed.    [provider]  polyethylene glycol (MIRALAX / GLYCOLAX) packet Take 17 g by mouth daily.    [provider]  QUEtiapine (SEROQUEL) 100 MG tablet TAKE 1 TABLET  BY MOUTH THREE TIMES A DAY 11/15/19   [name], [name], MD  QUEtiapine (SEROQUEL) 400 MG tablet Take 1 tablet (400 mg total) by mouth at bedtime. 09/19/19   [name], [name], MD  sertraline (ZOLOFT) 100 MG tablet Take 2 tablets (200 mg total) by mouth daily. 09/19/19   [name], [name], MD  sertraline (ZOLOFT) 50 MG tablet Take 2 tablets (100 mg total) by mouth daily. 09/19/19   [name], [name], MD  simethicone (MYLICON) 80 MG chewable tablet Chew 80-160 mg by mouth 2 (two) times  daily as needed for flatulence.    [provider]  simvastatin (ZOCOR) 20 MG tablet Take 20 mg by mouth at bedtime.     [provider]  sitaGLIPtin (JANUVIA) 100 MG tablet Take 100 mg by mouth daily.    [provider]  solifenacin (VESICARE) 5 MG tablet Take 5 mg by mouth daily.    [provider]  Starch (HEMORRHOIDAL RE) Place 1 application rectally 4 (four) times daily as needed (for itching).    [provider]  tamsulosin (FLOMAX) 0.4 MG CAPS capsule Take 0.4 mg by mouth daily.    [provider]  tolnaftate (TINACTIN) 1 % spray Apply 1 application topically daily.    [provider]  Allergies Other, Penicillins, and Cortizone-10 [hydrocortisone]  Family History  Problem Relation Age of Onset  . Hypertension Mother   . Stroke Father   . Heart Problems Father     Social History Social History   Tobacco Use  . Smoking status: Never Smoker  . Smokeless tobacco: Never Used  Substance Use Topics  . Alcohol use: No    Alcohol/week: 0.0 standard drinks  . Drug use: No    Review of Systems Constitutional: No fever/chills Eyes: No visual changes. ENT: No sore throat. Cardiovascular: Denies chest pain. Respiratory: Denies shortness of breath. Gastrointestinal: No abdominal pain.  No nausea, no vomiting.  No diarrhea.  No constipation. Genitourinary: Negative for dysuria. Musculoskeletal: Negative for back pain. Skin: Negative for rash.  Scalp abrasion Neurological: Negative for headaches, focal weakness or numbness. Psychiatric:  Anxiety, autism, depression,developmental delay, schizoaffective disorder. Endocrine: Diabetes,  Hyperlipidemia and hypertension. Allergic/Immunilogical: Penicillin and cortisone. ____________________________________________   PHYSICAL EXAM:  VITAL SIGNS: ED Triage Vitals  Enc Vitals Group     BP 12/09/19 1150 125/67     Pulse Rate 12/09/19 1150 (!) 58     Resp 12/09/19  1150 18     Temp 12/09/19 1150 98.9 F (37.2 C)     Temp Source 12/09/19 1150 Oral     SpO2 12/09/19 1150 95 %     Weight 12/09/19 1201 229 lb 4.5 oz (104 kg)     Height 12/09/19 1201 5\' 5"  (1.651 m)     Head Circumference --      Peak Flow --      Pain Score 12/09/19 1151 0     Pain Loc --      Pain Edu? --      Excl. in [place]? --    Constitutional: Alert and oriented. Well appearing and in no acute distress. Eyes: Conjunctivae are normal. PERRL. EOMI. Head: Atraumatic.  Scalp abrasion Nose: No congestion/rhinnorhea. Mouth/Throat: Mucous membranes are moist.  Oropharynx non-erythematous. Neck: No cervical spine tenderness to palpation. Hematological/Lymphatic/Immunilogical: No cervical lymphadenopathy. Cardiovascular: Normal rate, regular rhythm. Grossly normal heart sounds.  Good peripheral circulation. Respiratory: Normal respiratory effort.  No retractions. Lungs CTAB. Gastrointestinal: Soft and nontender. No distention. No abdominal bruits. No CVA tenderness. Musculoskeletal: No lower extremity tenderness nor edema.  No joint effusions. Neurologic:  Normal speech and language. No gross focal neurologic deficits are appreciated. No gait instability. Skin:  Skin is warm, dry and intact. No rash noted. Psychiatric: Mood and affect are normal. Speech and behavior are normal.  ____________________________________________   LABS (all labs ordered are listed, but only abnormal results are displayed)  Labs Reviewed - No data to display ____________________________________________  EKG   ____________________________________________  RADIOLOGY  ED MD interpretation:    Official radiology report(s): CT Head Wo Contrast  Result Date: 12/09/2019 CLINICAL DATA:  Headache after fall EXAM: CT HEAD WITHOUT CONTRAST TECHNIQUE: Contiguous axial images were obtained from the base of the skull through the vertex without intravenous contrast. COMPARISON:  February 09, 2017 FINDINGS: Brain:  There is mild diffuse atrophy. There is no intracranial mass, hemorrhage, extra-axial fluid collection, or midline shift. There is slight small vessel disease in the centra semiovale bilaterally. Elsewhere brain parenchyma appears unremarkable. No evident acute infarct. Vascular: No hyperdense vessels. There is calcification in each carotid siphon region. Skull: The bony calvarium appears intact. Sinuses/Orbits: Visualized paranasal sinuses are clear. Visualized orbits appear symmetric bilaterally except for evidence of previous cataract removal on the left. Other: Visualized mastoid air cells  are clear. IMPRESSION: Mild atrophy with mild periventricular small vessel disease. No acute infarct. No mass or hemorrhage. There are foci of arterial vascular calcification. Electronically Signed   By: Lowella Grip III M.D.   On: 12/09/2019 12:41    ____________________________________________   PROCEDURES  Procedure(s) performed (including Critical Care):  Procedures   ____________________________________________   INITIAL IMPRESSION / ASSESSMENT AND PLAN / ED COURSE  As part of my medical decision making, I reviewed the following data within the [place]     Patient arrived via EMS secondary to a slip and fall at the group home.  No LOC.  Patient sustained abrasions to the right parietal area of the scalp.  No active bleeding.  Discussed no acute findings on CT scan of the head with patient.  Advised patient that his multiple complaints about the care of the nursing home and unable to contact his brother at undisclosed hospital or concerns that need to be addressed at his facility.  Patient given discharge care instruction.   Alexander Beard was evaluated in Emergency Department on 12/09/2019 for the symptoms described in the history of present illness. He was evaluated in the context of the global COVID-19 pandemic, which necessitated consideration that the patient might be at  risk for infection with the SARS-CoV-2 virus that causes COVID-19. Institutional protocols and algorithms that pertain to the evaluation of patients at risk for COVID-19 are in a state of rapid change based on information released by regulatory bodies including the CDC and federal and state organizations. These policies and algorithms were followed during the patient's care in the ED.       ____________________________________________   FINAL CLINICAL IMPRESSION(S) / ED DIAGNOSES  Final diagnoses:  Scalp abrasion, initial encounter  Fall in home, initial encounter     ED Discharge Orders    None       Note:  This document was prepared using Dragon voice recognition software and may include unintentional dictation errors.    Sable Feil, PA-C 12/09/19 1316    Merlyn Lot, MD 12/09/19 1438

## 2019-12-12 ENCOUNTER — Ambulatory Visit: Payer: Medicare Other | Admitting: Internal Medicine

## 2019-12-15 NOTE — Procedures (Signed)
Alexander Beard, [id number]  DATE OF SERVICE: Dec 04, 2019  Complete Pulmonary Function Testing Interpretation:  FINDINGS:  Forced vital capacity is normal FEV1 is normal FEV1 FVC ratio is normal.  Postbronchodilator no significant change in FEV1 clinical improvement may still occur in the absence of spirometric improvement.  Total lung capacity is mildly decreased.  Residual volume is decreased residual in total lung capacity ratio is decreased.  DLCO is normal  IMPRESSION:  This pulmonary function study is suggestive of mild restrictive lung disease.  No significant spirometric response to bronchodilators clinical response may still occur  [name], MD [facility] Pulmonary Critical Care Medicine Sleep Medicine

## 2019-12-19 ENCOUNTER — Telehealth: Payer: Self-pay

## 2019-12-19 NOTE — Telephone Encounter (Signed)
Confirmed appointment on 12/23/2019 and screened for covid. [facility] 

## 2019-12-23 ENCOUNTER — Other Ambulatory Visit: Payer: Self-pay

## 2019-12-23 ENCOUNTER — Encounter: Payer: Self-pay | Admitting: Internal Medicine

## 2019-12-23 ENCOUNTER — Ambulatory Visit (INDEPENDENT_AMBULATORY_CARE_PROVIDER_SITE_OTHER): Payer: Medicare Other | Admitting: Internal Medicine

## 2019-12-23 VITALS — BP 100/53 | HR 52 | Temp 97.2°F | Resp 16 | Ht 65.0 in | Wt 214.0 lb

## 2019-12-23 DIAGNOSIS — J449 Chronic obstructive pulmonary disease, unspecified: Secondary | ICD-10-CM | POA: Diagnosis not present

## 2019-12-23 DIAGNOSIS — F250 Schizoaffective disorder, bipolar type: Secondary | ICD-10-CM | POA: Diagnosis not present

## 2019-12-23 DIAGNOSIS — G4733 Obstructive sleep apnea (adult) (pediatric): Secondary | ICD-10-CM | POA: Diagnosis not present

## 2019-12-23 DIAGNOSIS — Z9989 Dependence on other enabling machines and devices: Secondary | ICD-10-CM | POA: Diagnosis not present

## 2019-12-23 NOTE — Progress Notes (Signed)
[facility] [place], [place] [address]  Pulmonary Sleep Medicine   Office Visit Note  Patient Name: Alexander Beard DOB: 1955/10/16 MRN [id number]  Date of Service: 12/23/2019  Complaints/HPI: PT is here for pulmonary follow up.  He has history of OSA, and copd.  Pt reports good compliance with CPAP therapy. Cleaning machine by hand, and changing filters and tubing as directed. Denies headaches, sinus issues, palpitations, or hemoptysis.  His COPd is well controlled.  He continues to do well without inhalers.  He does not smoke, but reports second hand smoke exposure.    ROS  General: (-) fever, (-) chills, (-) night sweats, (-) weakness Skin: (-) rashes, (-) itching,. Eyes: (-) visual changes, (-) redness, (-) itching. Nose and Sinuses: (-) nasal stuffiness or itchiness, (-) postnasal drip, (-) nosebleeds, (-) sinus trouble. Mouth and Throat: (-) sore throat, (-) hoarseness. Neck: (-) swollen glands, (-) enlarged thyroid, (-) neck pain. Respiratory: - cough, (-) bloody sputum, - shortness of breath, - wheezing. Cardiovascular: - ankle swelling, (-) chest pain. Lymphatic: (-) lymph node enlargement. Neurologic: (-) numbness, (-) tingling. Psychiatric: (-) anxiety, (-) depression   Current Medication: Outpatient Encounter Medications as of 12/23/2019  Medication Sig Note  . acetaminophen (TYLENOL) 650 MG CR tablet Take 650 mg by mouth every 12 (twelve) hours as needed for pain.   Marland Kitchen aspirin EC 81 MG tablet Take 81 mg by mouth daily.   . Calcium Carbonate-Vitamin D3 (CALCIUM 600-D) 600-400 MG-UNIT TABS Take 1 tablet by mouth 2 (two) times daily.   . Cholecalciferol (VITAMIN D-1000 MAX ST) 1000 UNITS tablet Take 1,000 Units by mouth daily.    Marland Kitchen docusate sodium (COLACE) 100 MG capsule Take 100 mg by mouth daily.    . furosemide (LASIX) 20 MG tablet Take 20 mg by mouth daily.    Marland Kitchen gabapentin (NEURONTIN) 100 MG capsule Take 1 capsule (100 mg total) by mouth 3  (three) times daily.   . hydrocortisone (ANUSOL-HC) 2.5 % rectal cream Place 1 application rectally 4 (four) times daily as needed for hemorrhoids or anal itching.   . Insulin Detemir (LEVEMIR FLEXTOUCH) 100 UNIT/ML Pen Inject 15 Units into the skin daily at 10 pm.   . lamoTRIgine (LAMICTAL) 100 MG tablet Take 1 tablet (100 mg total) by mouth at bedtime.   Marland Kitchen lisinopril (PRINIVIL,ZESTRIL) 5 MG tablet Take 5 mg by mouth daily.    Marland Kitchen loratadine (CLARITIN) 10 MG tablet Take 1 tablet (10 mg total) by mouth daily.   Marland Kitchen LORazepam (ATIVAN) 0.5 MG tablet Take 1 tablet (0.5 mg total) by mouth 3 (three) times daily.   Marland Kitchen lurasidone (LATUDA) 40 MG TABS tablet Take 1 tablet (40 mg total) by mouth daily. with food   . meloxicam (MOBIC) 15 MG tablet Take 15 mg by mouth daily.   . Menthol (BIOFREEZE) 10 % AERO Apply 1 application topically as directed. 11/06/2017: Per MAR  . metoprolol succinate (TOPROL-XL) 50 MG 24 hr tablet Take 50 mg by mouth daily.    . naproxen (NAPROSYN) 500 MG tablet TAKE ONE TABLET BY MOUTH 2 TIMES A DAY AS NEEDED FOR PAIN *TAKE WITH FOOD*   . NON FORMULARY cpap device   . pantoprazole (PROTONIX) 40 MG tablet Take 40 mg by mouth daily.   . permethrin (ELIMITE) 5 % cream Apply 1 application topically as directed. 11/06/2017: Per MAR  . polyethylene glycol (MIRALAX / GLYCOLAX) packet Take 17 g by mouth daily.   . QUEtiapine (SEROQUEL)  100 MG tablet TAKE 1 TABLET  BY MOUTH THREE TIMES A DAY   . QUEtiapine (SEROQUEL) 400 MG tablet Take 1 tablet (400 mg total) by mouth at bedtime.   . sertraline (ZOLOFT) 100 MG tablet Take 2 tablets (200 mg total) by mouth daily.   . sertraline (ZOLOFT) 50 MG tablet Take 2 tablets (100 mg total) by mouth daily.   . simethicone (MYLICON) 80 MG chewable tablet Chew 80-160 mg by mouth 2 (two) times daily as needed for flatulence.   . simvastatin (ZOCOR) 20 MG tablet Take 20 mg by mouth at bedtime.    . sitaGLIPtin (JANUVIA) 100 MG tablet Take 100 mg by mouth daily.    . solifenacin (VESICARE) 5 MG tablet Take 5 mg by mouth daily.   . Starch (HEMORRHOIDAL RE) Place 1 application rectally 4 (four) times daily as needed (for itching).   . tamsulosin (FLOMAX) 0.4 MG CAPS capsule Take 0.4 mg by mouth daily.   Marland Kitchen tolnaftate (TINACTIN) 1 % spray Apply 1 application topically daily.    No facility-administered encounter medications on file as of 12/23/2019.    Surgical History: Past Surgical History:  Procedure Laterality Date  . CATARACT EXTRACTION W/PHACO Left 11/14/2017   Procedure: CATARACT EXTRACTION PHACO AND INTRAOCULAR LENS PLACEMENT (IOC);  Surgeon: Birder Robson, MD;  Location: [facility] ORS;  Service: Ophthalmology;  Laterality: Left;  [place] 00:42 AP% 14.6 CDE 6.12 Fluid pack lot # [id number]  . COLONOSCOPY  01/28/2005  . COLONOSCOPY WITH PROPOFOL N/A 05/09/2016   Procedure: COLONOSCOPY WITH PROPOFOL;  Surgeon: Manya Silvas, MD;  Location: [facility] ENDOSCOPY;  Service: Endoscopy;  Laterality: N/A;  . ESOPHAGOGASTRODUODENOSCOPY N/A 05/09/2016   Procedure: ESOPHAGOGASTRODUODENOSCOPY (EGD);  Surgeon: Manya Silvas, MD;  Location: [facility] ENDOSCOPY;  Service: Endoscopy;  Laterality: N/A;  . FRACTURE SURGERY     ORIF SHOULDER  . TONSILLECTOMY      Medical History: Past Medical History:  Diagnosis Date  . Anxiety   . Anxiety disorder due to known physiological condition    HOSPITALIZED 10/18  . Arthritis   . Autistic disorder, residual state   . COPD (chronic obstructive pulmonary disease) ([place])   . Depression   . Developmental disorder   . Diabetes mellitus without complication ([place])   . Dyslipidemia   . Esophageal reflux   . HOH (hard of hearing)    MILDLY  . Hypertension   . Obesity   . Overactive bladder   . Palpitations    ANXIETY  . Schizophrenia, schizoaffective ([place])   . Sleep apnea   . Tremors of nervous system    HANDS DUE TO MEDICATIONS  . Urinary incontinence     Family History: Family History  Problem Relation Age of  Onset  . Hypertension Mother   . Stroke Father   . Heart Problems Father     Social History: Social History   Socioeconomic History  . Marital status: Single    Spouse name: Not on file  . Number of children: Not on file  . Years of education: Not on file  . Highest education level: Not on file  Occupational History  . Not on file  Tobacco Use  . Smoking status: Never Smoker  . Smokeless tobacco: Never Used  Vaping Use  . Vaping Use: Never used  Substance and Sexual Activity  . Alcohol use: No    Alcohol/week: 0.0 standard drinks  . Drug use: No  . Sexual activity: Never  Other Topics Concern  . Not  on file  Social History Narrative   The patient never finished high school but did get his GED. 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 He is currently in disability and [name] is his legal guardian. He has been living in group homes for many years.      No pending legal charges   Social Determinants of Health   Financial Resource Strain:   . Difficulty of Paying Living Expenses:   Food Insecurity:   . Worried About Charity fundraiser in the Last Year:   . Arboriculturist in the Last Year:   Transportation Needs:   . Film/video editor (Medical):   Marland Kitchen Lack of Transportation (Non-Medical):   Physical Activity:   . Days of Exercise per Week:   . Minutes of Exercise per Session:   Stress:   . Feeling of Stress :   Social Connections:   . Frequency of Communication with Friends and Family:   . Frequency of Social Gatherings with Friends and Family:   . Attends Religious Services:   . Active Member of Clubs or Organizations:   . Attends Archivist Meetings:   Marland Kitchen Marital Status:   Intimate Partner Violence:   . Fear of Current or Ex-Partner:   . Emotionally Abused:   Marland Kitchen Physically Abused:   . Sexually Abused:     Vital Signs: Blood pressure (!) 100/53, pulse (!) 52, temperature  (!) 97.2 F (36.2 C), resp. rate 16, height 5\' 5"  (1.651 m), weight 214 lb (97.1 kg), SpO2 93 %.  Examination: General Appearance: The patient is well-developed, well-nourished, and in no distress. Skin: Gross inspection of skin unremarkable. Head: normocephalic, no gross deformities. Eyes: no gross deformities noted. ENT: ears appear grossly normal no exudates. Neck: Supple. No thyromegaly. No LAD. Respiratory: clear bilaterally. Cardiovascular: Normal S1 and S2 without murmur or rub. Extremities: No cyanosis. pulses are equal. Neurologic: Alert and oriented. No involuntary movements.  LABS: No results found for this or any previous visit (from the past 2160 hour(s)).  Radiology: CT Head Wo Contrast  Result Date: 12/09/2019 CLINICAL DATA:  Headache after fall EXAM: CT HEAD WITHOUT CONTRAST TECHNIQUE: Contiguous axial images were obtained from the base of the skull through the vertex without intravenous contrast. COMPARISON:  February 09, 2017 FINDINGS: Brain: There is mild diffuse atrophy. There is no intracranial mass, hemorrhage, extra-axial fluid collection, or midline shift. There is slight small vessel disease in the centra semiovale bilaterally. Elsewhere brain parenchyma appears unremarkable. No evident acute infarct. Vascular: No hyperdense vessels. There is calcification in each carotid siphon region. Skull: The bony calvarium appears intact. Sinuses/Orbits: Visualized paranasal sinuses are clear. Visualized orbits appear symmetric bilaterally except for evidence of previous cataract removal on the left. Other: Visualized mastoid air cells are clear. IMPRESSION: Mild atrophy with mild periventricular small vessel disease. No acute infarct. No mass or hemorrhage. There are foci of arterial vascular calcification. Electronically Signed   By: Lowella Grip III M.D.   On: 12/09/2019 12:41    No results found.  CT Head Wo Contrast  Result Date: 12/09/2019 CLINICAL DATA:  Headache  after fall EXAM: CT HEAD WITHOUT CONTRAST TECHNIQUE: Contiguous axial images were obtained from the base of the skull through the vertex without intravenous contrast. COMPARISON:  February 09, 2017 FINDINGS: Brain: There is mild diffuse atrophy. There is no intracranial mass, hemorrhage, extra-axial fluid collection, or midline  shift. There is slight small vessel disease in the centra semiovale bilaterally. Elsewhere brain parenchyma appears unremarkable. No evident acute infarct. Vascular: No hyperdense vessels. There is calcification in each carotid siphon region. Skull: The bony calvarium appears intact. Sinuses/Orbits: Visualized paranasal sinuses are clear. Visualized orbits appear symmetric bilaterally except for evidence of previous cataract removal on the left. Other: Visualized mastoid air cells are clear. IMPRESSION: Mild atrophy with mild periventricular small vessel disease. No acute infarct. No mass or hemorrhage. There are foci of arterial vascular calcification. Electronically Signed   By: Lowella Grip III M.D.   On: 12/09/2019 12:41      Assessment and Plan: Patient Active Problem List   Diagnosis Date Noted  . Diabetes ([place]) 04/10/2015  . Hypertension 04/10/2015  . Prostate hypertrophy 04/10/2015  . Schizoaffective disorder ([place]) 04/10/2015  . Enlarged prostate 04/10/2015  . Essential (primary) hypertension 04/10/2015  . Type 2 diabetes mellitus ([place]) 04/10/2015  . Controlled type 2 diabetes mellitus without complication ([place]) 50/51/8335  . Schizoaffective disorder, bipolar type ([place]) 12/29/2014  . Active autistic disorder 12/29/2014    1. OSA on CPAP Stable, continue to encouraged cpap compliance.    2. Obstructive chronic bronchitis without exacerbation (HCC) Stable, not on inhalers at this time.  Continue to monitor.   3. Schizoaffective disorder, bipolar type ([place]) Stable, followed by Psych.   General Counseling: I have discussed the findings of the evaluation and  examination with Alexander Beard.  I have also discussed any further diagnostic evaluation thatmay be needed or ordered today. Alexander Beard verbalizes understanding of the findings of todays visit. We also reviewed his medications today and discussed drug interactions and side effects including but not limited excessive drowsiness and altered mental states. We also discussed that there is always a risk not just to him but also people around him. he has been encouraged to call the office with any questions or concerns that should arise related to todays visit.  No orders of the defined types were placed in this encounter.    Time spent: 30 This patient was seen by Orson Gear AGNP-C in Collaboration with Dr. Devona Konig as a part of collaborative care agreement.   I have personally obtained a history, examined the patient, evaluated laboratory and imaging results, formulated the assessment and plan and placed orders.    [name], MD [facility] Pulmonary and Critical Care Sleep medicine

## 2019-12-26 ENCOUNTER — Telehealth: Payer: Self-pay

## 2019-12-26 ENCOUNTER — Ambulatory Visit: Payer: Medicare Other | Admitting: Urology

## 2019-12-26 NOTE — Telephone Encounter (Signed)
Gave [facility] orders for cpap supplies. Alexander Beard

## 2019-12-31 ENCOUNTER — Other Ambulatory Visit: Payer: Self-pay

## 2019-12-31 ENCOUNTER — Ambulatory Visit (INDEPENDENT_AMBULATORY_CARE_PROVIDER_SITE_OTHER): Payer: Medicare Other | Admitting: Psychiatry

## 2019-12-31 ENCOUNTER — Other Ambulatory Visit: Payer: Self-pay | Admitting: Psychiatry

## 2019-12-31 DIAGNOSIS — F840 Autistic disorder: Secondary | ICD-10-CM

## 2019-12-31 DIAGNOSIS — F259 Schizoaffective disorder, unspecified: Secondary | ICD-10-CM | POA: Diagnosis not present

## 2019-12-31 MED ORDER — QUETIAPINE FUMARATE 100 MG PO TABS: 100.0000 mg | ORAL_TABLET | Freq: Four times a day (QID) | ORAL | 5 refills | Status: DC | PRN

## 2019-12-31 MED ORDER — LORAZEPAM 1 MG PO TABS: 1.0000 mg | ORAL_TABLET | Freq: Three times a day (TID) | ORAL | 5 refills | Status: DC

## 2019-12-31 MED ORDER — QUETIAPINE FUMARATE 50 MG PO TABS: 150.0000 mg | ORAL_TABLET | Freq: Every day | ORAL | 5 refills | Status: DC | PRN

## 2019-12-31 MED ORDER — QUETIAPINE FUMARATE 50 MG PO TABS: 50.0000 mg | ORAL_TABLET | Freq: Four times a day (QID) | ORAL | 5 refills | Status: DC | PRN

## 2019-12-31 NOTE — Progress Notes (Signed)
Follow-up for this 64 year old man with autistic spectrum disorder.  Patient was seen in person in the office in the company of his group home manager.  Patient has had more episodes of agitated inappropriate behavior recently.  Specifically he has more frequently been running away from home which has resulted in the police being called more than once to take him into [facility] for evaluation.  Patient denies suicidal ideation.  These episodes are related to his ongoing anxiety about wanting more "freedom" that is really not appropriate given his disability.  Patient seen.  Affect anxious in a manner consistent with how he usually is.  No sign of paranoid or psychotic thinking or hallucinations.  No threats of violence no suicidal ideation.  Lots of supportive therapy and encouraging focus on the positive things in his life.  Encouraging him to work on alternative ways to deal with stress.  Conferred with group home manager and I am going to increase his Ativan to 1 mg 3 times a day.  Refilled the order for the Seroquel as well.  Including the as needed.  We can check up again in 6 months or I am always open to being called sooner if needed.

## 2020-01-22 ENCOUNTER — Emergency Department
Admission: EM | Admit: 2020-01-22 | Discharge: 2020-01-23 | Disposition: A | Discharge status: Home / Self Care | Payer: Medicare Other | Attending: Emergency Medicine | Admitting: Emergency Medicine

## 2020-01-22 ENCOUNTER — Other Ambulatory Visit: Payer: Self-pay

## 2020-01-22 DIAGNOSIS — Z79899 Other long term (current) drug therapy: Secondary | ICD-10-CM | POA: Insufficient documentation

## 2020-01-22 DIAGNOSIS — Z794 Long term (current) use of insulin: Secondary | ICD-10-CM | POA: Diagnosis not present

## 2020-01-22 DIAGNOSIS — Z7982 Long term (current) use of aspirin: Secondary | ICD-10-CM | POA: Diagnosis not present

## 2020-01-22 DIAGNOSIS — I10 Essential (primary) hypertension: Secondary | ICD-10-CM | POA: Insufficient documentation

## 2020-01-22 DIAGNOSIS — F840 Autistic disorder: Secondary | ICD-10-CM | POA: Diagnosis present

## 2020-01-22 DIAGNOSIS — J449 Chronic obstructive pulmonary disease, unspecified: Secondary | ICD-10-CM | POA: Diagnosis not present

## 2020-01-22 DIAGNOSIS — F250 Schizoaffective disorder, bipolar type: Secondary | ICD-10-CM | POA: Diagnosis present

## 2020-01-22 DIAGNOSIS — F259 Schizoaffective disorder, unspecified: Secondary | ICD-10-CM | POA: Diagnosis present

## 2020-01-22 DIAGNOSIS — Z598 Other problems related to housing and economic circumstances: Secondary | ICD-10-CM | POA: Insufficient documentation

## 2020-01-22 DIAGNOSIS — E119 Type 2 diabetes mellitus without complications: Secondary | ICD-10-CM | POA: Diagnosis not present

## 2020-01-22 DIAGNOSIS — R4589 Other symptoms and signs involving emotional state: Secondary | ICD-10-CM | POA: Diagnosis present

## 2020-01-22 DIAGNOSIS — Z5989 Other problems related to housing and economic circumstances: Secondary | ICD-10-CM

## 2020-01-22 LAB — CBC
HCT: 37.4 % — ABNORMAL LOW (ref 39.0–52.0)
Hemoglobin: 12.8 g/dL — ABNORMAL LOW (ref 13.0–17.0)
MCH: 32.2 pg (ref 26.0–34.0)
MCHC: 34.2 g/dL (ref 30.0–36.0)
MCV: 94 fL (ref 80.0–100.0)
Platelets: 150 10*3/uL (ref 150–400)
RBC: 3.98 MIL/uL — ABNORMAL LOW (ref 4.22–5.81)
RDW: 12.3 % (ref 11.5–15.5)
WBC: 6.1 10*3/uL (ref 4.0–10.5)
nRBC: 0 % (ref 0.0–0.2)

## 2020-01-22 LAB — COMPREHENSIVE METABOLIC PANEL
ALT: 8 U/L (ref 0–44)
AST: 20 U/L (ref 15–41)
Albumin: 3.9 g/dL (ref 3.5–5.0)
Alkaline Phosphatase: 60 U/L (ref 38–126)
Anion gap: 9 (ref 5–15)
BUN: 24 mg/dL — ABNORMAL HIGH (ref 8–23)
CO2: 27 mmol/L (ref 22–32)
Calcium: 8.8 mg/dL — ABNORMAL LOW (ref 8.9–10.3)
Chloride: 100 mmol/L (ref 98–111)
Creatinine, Ser: 1.12 mg/dL (ref 0.61–1.24)
GFR calc Af Amer: >60 mL/min (ref >60)
GFR calc non Af Amer: >60 mL/min (ref >60)
Glucose, Bld: 120 mg/dL — ABNORMAL HIGH (ref 70–99)
Potassium: 4 mmol/L (ref 3.5–5.1)
Sodium: 136 mmol/L (ref 135–145)
Total Bilirubin: 0.7 mg/dL (ref 0.3–1.2)
Total Protein: 7 g/dL (ref 6.5–8.1)

## 2020-01-22 LAB — SALICYLATE LEVEL: Salicylate Lvl: <7 mg/dL — ABNORMAL LOW (ref 7.0–30.0)

## 2020-01-22 LAB — ACETAMINOPHEN LEVEL: Acetaminophen (Tylenol), Serum: <10 ug/mL — ABNORMAL LOW (ref 10–30)

## 2020-01-22 LAB — ETHANOL: Alcohol, Ethyl (B): <10 mg/dL (ref <10)
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
## 2020-01-22 NOTE — ED Notes (Signed)
This nurse spoke to pt brother and is updated on plan of care and DC status. States he is unable to pick up pt due to recent surgery and not able to drive

## 2020-01-22 NOTE — Consult Note (Signed)
[facility] Consult   Reason for Consult: Upset Referring Physician: Dr. Archie Beard Patient Identification: [facility] MRN:  [id number] Principal Diagnosis: <principal problem not specified> Diagnosis:  Active Problems:   Schizoaffective disorder, bipolar type ([facility])   Active autistic disorder   Schizoaffective disorder ([facility])   Total Time spent with patient: 30 minutes  Subjective: "I do not like my group home." [facility] is a 64 y.o. male patient presented to [facility] via POV voluntarily. Per the ED triage nurse note, the patient states he is here for IVC. The patient states he became upset at the day program because they would not let him play volleyball due to too many falls. The patient states they were trying to keep him from having injuries. The patient reports living conditions are not good, and the staff is yelling.  The patient states he has done the same; there was an incident at [facility].  The patient was seen face-to-face by this provider; the chart was reviewed and consulted with Dr. Archie Beard and Dr. Carlena Beard on 01/22/2020 due to the patient's care. It was discussed with both providers that the patient does not meet the criteria to be admitted to the psychiatric inpatient unit.  On evaluation, the patient is alert and oriented x 2-3, anxious, cooperative, and mood-congruent with affect. The patient does not appear to be responding to internal or external stimuli. He is presenting with some delusional thinking. The patient denies auditory or visual hallucinations. The patient denies any suicidal, homicidal, or self-harm ideations. The patient is not presenting with any psychotic behaviors but presents with some paranoid behaviors about his living situation. During an encounter with the patient, he could not answer most of the questions raised appropriately.    Plan: The patient is not a safety risk to self or others and does not require psychiatric  inpatient admission for stabilization and treatment.  HPI: Per Dr. Archie Beard: [facility] is a 64 y.o. male who presents to the emergency department today under IVC after getting in his car mesh.  The patient states he was upset today because he was not being allowed to play volleyball at his group home.  He states that for many reasons he has not been happy at this group home.  He states that he feels like he can do a lot more than they are allowing him to do.  This made him upset so he got in a skirmish at [facility].  He states he then tried to extricate himself from the situation.  Patient denies any SI or HI.  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    HOSPITALIZED 10/18   Arthritis    Autistic disorder, residual state    COPD (chronic obstructive pulmonary disease) (HCC)    Depression    Developmental disorder    Diabetes mellitus without complication (HCC)    Dyslipidemia    Esophageal reflux    HOH (hard of hearing)    MILDLY   Hypertension    Obesity    Overactive bladder    Palpitations    ANXIETY   Schizophrenia, schizoaffective (HCC)    Sleep apnea  Tremors of nervous system    HANDS DUE TO MEDICATIONS   Urinary incontinence     Past Surgical History:  Procedure Laterality Date   CATARACT EXTRACTION W/PHACO Left 11/14/2017   Procedure: CATARACT EXTRACTION PHACO AND INTRAOCULAR LENS PLACEMENT (IOC);  Surgeon: Alexander Robson, MD;  Location: [facility] ORS;  Service: Ophthalmology;  Laterality: Left;  [place] 00:42 AP% 14.6 CDE 6.12 Fluid pack lot # [id number]   COLONOSCOPY  01/28/2005   COLONOSCOPY WITH  PROPOFOL N/A 05/09/2016   Procedure: COLONOSCOPY WITH PROPOFOL;  Surgeon: Alexander Silvas, MD;  Location: [facility] ENDOSCOPY;  Service: Endoscopy;  Laterality: N/A;   ESOPHAGOGASTRODUODENOSCOPY N/A 05/09/2016   Procedure: ESOPHAGOGASTRODUODENOSCOPY (EGD);  Surgeon: Alexander Silvas, MD;  Location: [facility];  Service: Endoscopy;  Laterality: N/A;   FRACTURE SURGERY     ORIF SHOULDER   TONSILLECTOMY     Family History:  Family History  Problem Relation Age of Onset   Hypertension Mother    Stroke Father    Heart Problems Father    Family Psychiatric  History:  Social History:  Social History   Substance and Sexual Activity  Alcohol Use No   Alcohol/week: 0.0 standard drinks     Social History   Substance and Sexual Activity  Drug Use No    Social History   Socioeconomic History   Marital status: Single    Spouse name: Not on file   Number of children: Not on file   Years of education: Not on file   Highest education level: Not on file  Occupational History   Not on file  Tobacco Use   Smoking status: Never Smoker   Smokeless tobacco: Never Used  Vaping Use   Vaping Use: Never used  Substance and Sexual Activity   Alcohol use: No    Alcohol/week: 0.0 standard drinks   Drug use: No   Sexual activity: Never  Other Topics Concern   Not on file  Social History Narrative   The patient never finished high school but did get his GED. 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 He is currently in disability and his brother [facility] is his legal guardian. He has been living in group homes for many years.      No pending legal charges   Social Determinants of Health   Financial Resource Strain:    Difficulty of Paying Living Expenses:   Food Insecurity:    Worried About Charity fundraiser in the Last Year:    Arboriculturist in the Last Year:   Transportation Needs:    Lexicographer (Medical):    Lack of Transportation (Non-Medical):   Physical Activity:    Days of Exercise per Week:    Minutes of Exercise per Session:   Stress:    Feeling of Stress :   Social Connections:    Frequency of Communication with Friends and Family:    Frequency of Social Gatherings with Friends and Family:    Attends Religious Services:    Active Member of Clubs or Organizations:    Attends Archivist Meetings:    Marital Status:    Additional Social History:    Allergies:   Allergies  Allergen Reactions   Other Other (See Comments)    MYACINS   Penicillins Nausea And Vomiting   Cortizone-10 [Hydrocortisone] Rash    Labs:  Results for orders placed or performed during  the hospital encounter of 01/22/20 (from the past 48 hour(s))  Comprehensive metabolic panel     Status: Abnormal   Collection Time: 01/22/20  8:34 PM  Result Value Ref Range   Sodium 136 135 - 145 mmol/L   Potassium 4.0 3.5 - 5.1 mmol/L   Chloride 100 98 - 111 mmol/L   CO2 27 22 - 32 mmol/L   Glucose, Bld 120 (H) 70 - 99 mg/dL    Comment: Glucose reference range applies only to samples taken after fasting for at least 8 hours.   BUN 24 (H) 8 - 23 mg/dL   Creatinine, Ser 1.12 0.61 - 1.24 mg/dL   Calcium 8.8 (L) 8.9 - 10.3 mg/dL   Total Protein 7.0 6.5 - 8.1 g/dL   Albumin 3.9 3.5 - 5.0 g/dL   AST 20 15 - 41 U/L   ALT 8 0 - 44 U/L   Alkaline Phosphatase 60 38 - 126 U/L   Total Bilirubin 0.7 0.3 - 1.2 mg/dL   GFR calc non Af Amer >60 >60 mL/min   GFR calc Af Amer >60 >60 mL/min   Anion gap 9 5 - 15    Comment: Performed at [facility], [address], [place], [place] [address]  Ethanol     Status: None   Collection Time: 01/22/20  8:34 PM  Result Value Ref Range   Alcohol, Ethyl (B) <10 <10 mg/dL    Comment: (NOTE) Lowest detectable limit for serum alcohol is 10 mg/dL.  For medical purposes only. Performed at [facility], [address] [place], [place] [address]   Salicylate level     Status: Abnormal   Collection Time: 01/22/20  8:34 PM  Result Value Ref Range   Salicylate Lvl <7.5 (L) 7.0 - 30.0 mg/dL    Comment: Performed at [facility], [address] [place], [place] [address]  Acetaminophen level     Status: Abnormal   Collection Time: 01/22/20  8:34 PM  Result Value Ref Range   Acetaminophen (Tylenol), Serum <10 (L) 10 - 30 ug/mL    Comment: (NOTE) Therapeutic concentrations vary significantly. A range of 10-30 ug/mL  may be an effective concentration for many patients. However, some  are best treated at concentrations outside of this range. Acetaminophen concentrations >150 ug/mL at 4 hours after ingestion  and >50 ug/mL at 12 hours after ingestion are often associated with  toxic reactions.  Performed at [facility], [address] [place], [place] [address]   cbc     Status: Abnormal   Collection Time: 01/22/20  8:34 PM  Result Value Ref Range   WBC 6.1 4.0 - 10.5 K/uL   RBC 3.98 (L) 4.22 - 5.81 MIL/uL   Hemoglobin 12.8 (L) 13.0 - 17.0 g/dL   HCT 37.4 (L) 39 - 52 %   MCV 94.0 80.0 - 100.0 fL   MCH 32.2 26.0 - 34.0 pg   MCHC 34.2 30.0 - 36.0 g/dL   RDW 12.3 11.5 - 15.5 %   Platelets 150 150 - 400 K/uL   nRBC 0.0 0.0 - 0.2 %    Comment: Performed at [facility], [address] [place], [place] [address]    No current facility-administered medications for this encounter.   Current Outpatient Medications  Medication Sig Dispense Refill   acetaminophen (TYLENOL) 650 MG CR tablet Take 650 mg by mouth every 12 (twelve) hours as needed for pain.     aspirin EC 81  MG tablet Take 81 mg by mouth daily.     Calcium Carbonate-Vitamin D3 (CALCIUM 600-D) 600-400 MG-UNIT TABS Take 1 tablet by mouth 2 (two) times daily.     Cholecalciferol (VITAMIN D-1000 MAX ST) 1000 UNITS tablet Take 1,000 Units by mouth daily.      docusate sodium (COLACE) 100 MG capsule Take  100 mg by mouth daily.      furosemide (LASIX) 20 MG tablet Take 20 mg by mouth daily.      gabapentin (NEURONTIN) 100 MG capsule Take 1 capsule (100 mg total) by mouth 3 (three) times daily. 90 capsule 10   hydrocortisone (ANUSOL-HC) 2.5 % rectal cream Place 1 application rectally 4 (four) times daily as needed for hemorrhoids or anal itching.     Insulin Detemir (LEVEMIR FLEXTOUCH) 100 UNIT/ML Pen Inject 15 Units into the skin daily at 10 pm.     lamoTRIgine (LAMICTAL) 100 MG tablet Take 1 tablet (100 mg total) by mouth at bedtime. 30 tablet 10   lisinopril (PRINIVIL,ZESTRIL) 5 MG tablet Take 5 mg by mouth daily.      loratadine (CLARITIN) 10 MG tablet Take 1 tablet (10 mg total) by mouth daily. 30 tablet 5   LORazepam (ATIVAN) 1 MG tablet Take 1 tablet (1 mg total) by mouth 3 (three) times daily. 90 tablet 5   lurasidone (LATUDA) 40 MG TABS tablet Take 1 tablet (40 mg total) by mouth daily. with food 30 tablet 10   meloxicam (MOBIC) 15 MG tablet Take 15 mg by mouth daily.     Menthol (BIOFREEZE) 10 % AERO Apply 1 application topically as directed.     metoprolol succinate (TOPROL-XL) 50 MG 24 hr tablet Take 50 mg by mouth daily.      naproxen (NAPROSYN) 500 MG tablet TAKE ONE TABLET BY MOUTH 2 TIMES A DAY AS NEEDED FOR PAIN *TAKE WITH FOOD* 30 tablet 11   NON FORMULARY cpap device     pantoprazole (PROTONIX) 40 MG tablet Take 40 mg by mouth daily.     permethrin (ELIMITE) 5 % cream Apply 1 application topically as directed.     polyethylene glycol (MIRALAX / GLYCOLAX) packet Take 17 g by mouth daily.     QUEtiapine (SEROQUEL) 100 MG tablet TAKE 1 TABLET  BY MOUTH THREE TIMES A DAY 90 tablet 11   QUEtiapine (SEROQUEL) 400 MG tablet Take 1 tablet (400 mg total) by mouth at bedtime. 30 tablet 10   QUEtiapine (SEROQUEL) 50 MG tablet Take 3 tablets (150 mg total) by mouth daily as needed (anxiety). 90 tablet 5   sertraline (ZOLOFT) 100 MG tablet Take 2 tablets (200 mg total)  by mouth daily. 60 tablet 5   sertraline (ZOLOFT) 50 MG tablet Take 2 tablets (100 mg total) by mouth daily. 60 tablet 5   simethicone (MYLICON) 80 MG chewable tablet Chew 80-160 mg by mouth 2 (two) times daily as needed for flatulence.     simvastatin (ZOCOR) 20 MG tablet Take 20 mg by mouth at bedtime.      sitaGLIPtin (JANUVIA) 100 MG tablet Take 100 mg by mouth daily.     solifenacin (VESICARE) 5 MG tablet Take 5 mg by mouth daily.     Starch (HEMORRHOIDAL RE) Place 1 application rectally 4 (four) times daily as needed (for itching).     tamsulosin (FLOMAX) 0.4 MG CAPS capsule Take 0.4 mg by mouth daily.     tolnaftate (TINACTIN) 1 % spray Apply 1 application topically daily.  Musculoskeletal: Strength & Muscle Tone: within normal limits Gait & Station: normal Patient leans: N/A  Psychiatric Specialty Exam: Physical Exam Psychiatric:        Attention and Perception: He is inattentive.        Mood and Affect: Mood is depressed. Affect is blunt and inappropriate.        Speech: Speech is rapid and pressured and tangential.        Behavior: Behavior is slowed.        Thought Content: Thought content normal.        Cognition and Memory: Cognition is impaired.        Judgment: Judgment is inappropriate.     Review of Systems  Psychiatric/Behavioral: The patient is nervous/anxious.   All other systems reviewed and are negative.   Blood pressure (!) 135/97, pulse (!) 54, temperature 98 F (36.7 C), temperature source Oral, resp. rate 16, height 5\' 8"  (1.727 m), weight 104.3 kg, SpO2 94 %.Body mass index is 34.97 kg/m.  General Appearance: Casual  Eye Contact:  Fair  Speech:  Garbled and Slurred  Volume:  Decreased  Mood:  Anxious and Depressed  Affect:  Congruent  Thought Process:  Coherent  Orientation:  Full (Time, Place, and Person)  Thought Content:  Illogical and Rumination  Suicidal Thoughts:  No  Homicidal Thoughts:  No  Memory:  Immediate;    Poor Recent;   Poor Remote;   Poor  Judgement:  Poor  Insight:  Lacking  Psychomotor Activity:  Normal  Concentration:  Concentration: Poor and Attention Span: Poor  Recall:  Poor  Fund of Knowledge:  Poor  Language:  Poor  Akathisia:  Negative  Handed:  Right  AIMS (if indicated):     Assets:  Communication Skills Housing Social Support  ADL's:  Intact  Cognition:  Impaired,  Mild  Sleep:        Treatment Plan Summary: Plan Patient does not meet criteria for psychiatric inpatient admission.  Disposition: No evidence of imminent risk to self or others at present.   Patient does not meet criteria for psychiatric inpatient admission. Supportive therapy provided about ongoing stressors. Refer to IOP. Discussed crisis plan, support from social network, calling 911, coming to the Emergency Department, and calling Suicide Hotline.  [name], NP 01/22/2020 11:29 PM

## 2020-01-22 NOTE — Discharge Instructions (Signed)
Please seek medical attention for any high fevers, chest pain, shortness of breath, change in behavior, persistent vomiting, bloody stool or any other new or concerning symptoms.  

## 2020-01-22 NOTE — ED Triage Notes (Signed)
Pt states he is here for IVC. Pt states he became upset at the day program because the y would not let him play volley ball due to too many falls. Pt states they were trying to keep him from having injuries. Pt reports living conditions are not good and staff are yelling. Pt states he has done the same, There was an incident at [employer].

## 2020-01-22 NOTE — ED Notes (Addendum)
Pt reports he is here today due to not being allowed to place volleyball at his group home and becoming angry and acting out. 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 Pt is slow speaking but requests to stay independent and does not need help. Resting in bed at this time and denies any needs

## 2020-01-22 NOTE — ED Notes (Addendum)
This nurse attempted to contact pt group home at number in chart and online, this nurse spoke to individual after 4th phone call who provided number for Crisoforo Oxford, [place] Administrator. This nurse called Ms. Tamala Julian to update on discharge status and need for ride home from ER. Ms. Tamala Julian expresses her unhappiness with time of night that the group home and herself was contacted. Ms. Tamala Julian was updated by this nurse on process of care in ED and that pt is now up for DC. Ms. Tamala Julian states that the group home does not have the capabilities to care for pt, this nurse asks if she is stating pt is kicked out of nursing home with Ms. Tamala Julian denies. This nurse asks her to hold so that this nurse could speak to Charge Nurse in ED. Charge Nurse, Lorriane Shire, speaks to Ms. Tamala Julian and again explains situation and plan for DC. Lorriane Shire, RN was informed that a ride would be sent to pick up the pt.

## 2020-01-22 NOTE — ED Notes (Signed)
Pt speaking to ED provider at this time

## 2020-01-22 NOTE — ED Notes (Addendum)
This RN provided phone by [employer], RN to speak with Crisoforo Oxford, group home owner. Mrs. Tamala Julian unwilling to provide transportation for pts discharge due to staff asleep. Mrs. Tamala Julian informed that pt is discharged and group home is responsible for this pt. Crisoforo Oxford willing to now come pick pt up. Gerald Stabs, RN informed as well as brother  POA.

## 2020-01-22 NOTE — ED Notes (Signed)
Pt provided with [place] sandwich tray and drink at this time

## 2020-01-22 NOTE — ED Notes (Signed)
Pt ambulatory to restroom at this time. Pt assisted by Hinda Lenis, pt unable to provide sample. Back to bed and resting comfortably.

## 2020-01-22 NOTE — ED Notes (Addendum)
Pt. Is being dressed out by this tech and officer, Pt was provided with hospital attire (burgundy scrub top and bottom)   Pt belongings are being placed in bag.   Belongings are:   [employer] watch [employer] Shirt White tank  [employer]  Green socks Blue stripped pants.

## 2020-01-22 NOTE — ED Notes (Signed)
Attempted to call group home x 2. No answer, voicemail left to call back

## 2020-01-23 NOTE — ED Notes (Signed)
Pt picked up by Crisoforo Oxford and her husband to return to group home. When this nurse took pt to car outside front door, [facility] expressed her unhappiness with being here at this time of night to pick up pt. This nurse asked [facility] to sign for pt DC and that she was picking up pt, she refused and states that pt can sign for himself. While with [facility] and her husband they questioned this nurse about names of ED Dr. And names of psych team, which was provided by this nurse and educated that the names of the team can be found in his chart. Husband later states when this nurse was returning inside that this nurse is "making a mistake and now the group home is responsible for the him if something happens." Pt in car and leaves with [facility] and her husband.

## 2020-01-23 NOTE — ED Notes (Addendum)
Hinda Lenis assisting pt to change to street clothes at this time. Pt ambulatory on own power.

## 2020-01-30 ENCOUNTER — Telehealth: Payer: Self-pay

## 2020-01-30 NOTE — Telephone Encounter (Signed)
pt brother called left a message that [employer] had been expeled from the group home that he was in and he wanted to let you know.

## 2020-02-13 NOTE — Telephone Encounter (Signed)
Thank you.  I called [facility] back and left a voicemail.  I will keep trying to get in touch with him.

## 2020-02-18 ENCOUNTER — Encounter: Payer: Self-pay | Admitting: Emergency Medicine

## 2020-02-18 ENCOUNTER — Other Ambulatory Visit: Payer: Self-pay

## 2020-02-18 DIAGNOSIS — I10 Essential (primary) hypertension: Secondary | ICD-10-CM | POA: Insufficient documentation

## 2020-02-18 DIAGNOSIS — E119 Type 2 diabetes mellitus without complications: Secondary | ICD-10-CM | POA: Insufficient documentation

## 2020-02-18 DIAGNOSIS — J449 Chronic obstructive pulmonary disease, unspecified: Secondary | ICD-10-CM | POA: Insufficient documentation

## 2020-02-18 DIAGNOSIS — Z20822 Contact with and (suspected) exposure to covid-19: Secondary | ICD-10-CM | POA: Insufficient documentation

## 2020-02-18 DIAGNOSIS — Z794 Long term (current) use of insulin: Secondary | ICD-10-CM | POA: Insufficient documentation

## 2020-02-18 DIAGNOSIS — F259 Schizoaffective disorder, unspecified: Secondary | ICD-10-CM | POA: Insufficient documentation

## 2020-02-18 DIAGNOSIS — Z7982 Long term (current) use of aspirin: Secondary | ICD-10-CM | POA: Insufficient documentation

## 2020-02-18 DIAGNOSIS — F0391 Unspecified dementia with behavioral disturbance: Secondary | ICD-10-CM | POA: Diagnosis not present

## 2020-02-18 DIAGNOSIS — F250 Schizoaffective disorder, bipolar type: Secondary | ICD-10-CM | POA: Diagnosis not present

## 2020-02-18 DIAGNOSIS — Z79899 Other long term (current) drug therapy: Secondary | ICD-10-CM | POA: Insufficient documentation

## 2020-02-18 NOTE — ED Triage Notes (Addendum)
Patient ambulatory to triage with steady gait, without difficulty or distress noted; pt reports "my life is unhappy, it's miserable cause I can't get out because of the virus"; pt denies SI or HI, but reports he is having some anxiety and would like to talk to someone about it because he doesn't like his current psychiatrist; pt is accomp by his brother

## 2020-02-19 ENCOUNTER — Inpatient Hospital Stay
Admission: AD | Admit: 2020-02-19 | Discharge: 2020-06-08 | DRG: 885 | Disposition: A | Discharge status: Home / Self Care | Payer: Medicare Other | Source: Intra-hospital | Attending: Behavioral Health | Admitting: Behavioral Health

## 2020-02-19 ENCOUNTER — Other Ambulatory Visit: Payer: Self-pay

## 2020-02-19 ENCOUNTER — Emergency Department
Admission: EM | Admit: 2020-02-19 | Discharge: 2020-02-19 | Disposition: A | Discharge status: Other Acute Inpatient Hospital | Payer: Medicare Other | Attending: Emergency Medicine | Admitting: Emergency Medicine

## 2020-02-19 DIAGNOSIS — E785 Hyperlipidemia, unspecified: Secondary | ICD-10-CM | POA: Diagnosis present

## 2020-02-19 DIAGNOSIS — Z7984 Long term (current) use of oral hypoglycemic drugs: Secondary | ICD-10-CM | POA: Diagnosis not present

## 2020-02-19 DIAGNOSIS — Z9189 Other specified personal risk factors, not elsewhere classified: Secondary | ICD-10-CM

## 2020-02-19 DIAGNOSIS — Z888 Allergy status to other drugs, medicaments and biological substances status: Secondary | ICD-10-CM

## 2020-02-19 DIAGNOSIS — F4320 Adjustment disorder, unspecified: Secondary | ICD-10-CM | POA: Diagnosis present

## 2020-02-19 DIAGNOSIS — F250 Schizoaffective disorder, bipolar type: Principal | ICD-10-CM | POA: Diagnosis present

## 2020-02-19 DIAGNOSIS — F79 Unspecified intellectual disabilities: Secondary | ICD-10-CM

## 2020-02-19 DIAGNOSIS — F410 Panic disorder [episodic paroxysmal anxiety] without agoraphobia: Secondary | ICD-10-CM | POA: Diagnosis present

## 2020-02-19 DIAGNOSIS — Z9842 Cataract extraction status, left eye: Secondary | ICD-10-CM

## 2020-02-19 DIAGNOSIS — H9190 Unspecified hearing loss, unspecified ear: Secondary | ICD-10-CM | POA: Diagnosis present

## 2020-02-19 DIAGNOSIS — Z20822 Contact with and (suspected) exposure to covid-19: Secondary | ICD-10-CM | POA: Diagnosis present

## 2020-02-19 DIAGNOSIS — G4730 Sleep apnea, unspecified: Secondary | ICD-10-CM | POA: Diagnosis present

## 2020-02-19 DIAGNOSIS — Z880 Allergy status to penicillin: Secondary | ICD-10-CM

## 2020-02-19 DIAGNOSIS — G479 Sleep disorder, unspecified: Secondary | ICD-10-CM | POA: Diagnosis present

## 2020-02-19 DIAGNOSIS — Z7982 Long term (current) use of aspirin: Secondary | ICD-10-CM

## 2020-02-19 DIAGNOSIS — E119 Type 2 diabetes mellitus without complications: Secondary | ICD-10-CM | POA: Diagnosis present

## 2020-02-19 DIAGNOSIS — N3944 Nocturnal enuresis: Secondary | ICD-10-CM | POA: Diagnosis present

## 2020-02-19 DIAGNOSIS — M1990 Unspecified osteoarthritis, unspecified site: Secondary | ICD-10-CM | POA: Diagnosis present

## 2020-02-19 DIAGNOSIS — F259 Schizoaffective disorder, unspecified: Secondary | ICD-10-CM

## 2020-02-19 DIAGNOSIS — F411 Generalized anxiety disorder: Secondary | ICD-10-CM | POA: Diagnosis present

## 2020-02-19 DIAGNOSIS — R197 Diarrhea, unspecified: Secondary | ICD-10-CM | POA: Diagnosis not present

## 2020-02-19 DIAGNOSIS — Z599 Problem related to housing and economic circumstances, unspecified: Secondary | ICD-10-CM

## 2020-02-19 DIAGNOSIS — N3281 Overactive bladder: Secondary | ICD-10-CM | POA: Diagnosis present

## 2020-02-19 DIAGNOSIS — Z8249 Family history of ischemic heart disease and other diseases of the circulatory system: Secondary | ICD-10-CM | POA: Diagnosis not present

## 2020-02-19 DIAGNOSIS — F0390 Unspecified dementia without behavioral disturbance: Secondary | ICD-10-CM

## 2020-02-19 DIAGNOSIS — Z79899 Other long term (current) drug therapy: Secondary | ICD-10-CM

## 2020-02-19 DIAGNOSIS — F919 Conduct disorder, unspecified: Secondary | ICD-10-CM | POA: Diagnosis present

## 2020-02-19 DIAGNOSIS — J3089 Other allergic rhinitis: Secondary | ICD-10-CM | POA: Diagnosis present

## 2020-02-19 DIAGNOSIS — Z600 Problems of adjustment to life-cycle transitions: Secondary | ICD-10-CM | POA: Diagnosis present

## 2020-02-19 DIAGNOSIS — G2401 Drug induced subacute dyskinesia: Secondary | ICD-10-CM | POA: Diagnosis present

## 2020-02-19 DIAGNOSIS — R3981 Functional urinary incontinence: Secondary | ICD-10-CM | POA: Diagnosis not present

## 2020-02-19 DIAGNOSIS — I447 Left bundle-branch block, unspecified: Secondary | ICD-10-CM | POA: Diagnosis present

## 2020-02-19 DIAGNOSIS — Z823 Family history of stroke: Secondary | ICD-10-CM | POA: Diagnosis not present

## 2020-02-19 DIAGNOSIS — I10 Essential (primary) hypertension: Secondary | ICD-10-CM | POA: Diagnosis present

## 2020-02-19 DIAGNOSIS — J449 Chronic obstructive pulmonary disease, unspecified: Secondary | ICD-10-CM | POA: Diagnosis present

## 2020-02-19 DIAGNOSIS — Z961 Presence of intraocular lens: Secondary | ICD-10-CM | POA: Diagnosis present

## 2020-02-19 DIAGNOSIS — Z794 Long term (current) use of insulin: Secondary | ICD-10-CM

## 2020-02-19 DIAGNOSIS — K5900 Constipation, unspecified: Secondary | ICD-10-CM | POA: Diagnosis not present

## 2020-02-19 DIAGNOSIS — N400 Enlarged prostate without lower urinary tract symptoms: Secondary | ICD-10-CM | POA: Diagnosis present

## 2020-02-19 DIAGNOSIS — F0391 Unspecified dementia with behavioral disturbance: Secondary | ICD-10-CM | POA: Diagnosis not present

## 2020-02-19 DIAGNOSIS — R251 Tremor, unspecified: Secondary | ICD-10-CM | POA: Diagnosis present

## 2020-02-19 DIAGNOSIS — R112 Nausea with vomiting, unspecified: Secondary | ICD-10-CM | POA: Diagnosis not present

## 2020-02-19 DIAGNOSIS — F840 Autistic disorder: Secondary | ICD-10-CM | POA: Diagnosis present

## 2020-02-19 DIAGNOSIS — K219 Gastro-esophageal reflux disease without esophagitis: Secondary | ICD-10-CM | POA: Diagnosis present

## 2020-02-19 LAB — SARS CORONAVIRUS 2 BY RT PCR (HOSPITAL ORDER, PERFORMED IN ~~LOC~~ HOSPITAL LAB): SARS Coronavirus 2: NEGATIVE

## 2020-02-19 LAB — GLUCOSE, CAPILLARY
Glucose-Capillary: 150 mg/dL — ABNORMAL HIGH (ref 70–99)
Glucose-Capillary: 188 mg/dL — ABNORMAL HIGH (ref 70–99)
Glucose-Capillary: 178 mg/dL — ABNORMAL HIGH (ref 70–99)

## 2020-02-19 MED ORDER — LISINOPRIL 5 MG PO TABS
5.0000 mg | ORAL_TABLET | Freq: Every day | ORAL | Status: DC
  Administered 2020-02-19: 5 mg via ORAL
  Filled 2020-02-19: qty 1

## 2020-02-19 MED ORDER — NAPROXEN 500 MG PO TABS
250.0000 mg | ORAL_TABLET | Freq: Two times a day (BID) | ORAL | Status: DC
  Administered 2020-02-19: 250 mg via ORAL
  Filled 2020-02-19: qty 1

## 2020-02-19 MED ORDER — LORAZEPAM 1 MG PO TABS
1.0000 mg | ORAL_TABLET | ORAL | Status: AC
  Administered 2020-02-19: 1 mg via ORAL
  Filled 2020-02-19: qty 1

## 2020-02-19 MED ORDER — ALUM & MAG HYDROXIDE-SIMETH 200-200-20 MG/5ML PO SUSP
30.0000 mL | ORAL | Status: DC | PRN
  Filled 2020-02-19 (×2): qty 30

## 2020-02-19 MED ORDER — DOCUSATE SODIUM 100 MG PO CAPS
100.0000 mg | ORAL_CAPSULE | Freq: Every day | ORAL | Status: DC
  Administered 2020-02-19: 100 mg via ORAL
  Filled 2020-02-19: qty 1

## 2020-02-19 MED ORDER — ACETAMINOPHEN 325 MG PO TABS
650.0000 mg | ORAL_TABLET | Freq: Four times a day (QID) | ORAL | Status: DC | PRN
  Administered 2020-05-14: 650 mg via ORAL
  Filled 2020-02-19: qty 2

## 2020-02-19 MED ORDER — QUETIAPINE FUMARATE 200 MG PO TABS
400.0000 mg | ORAL_TABLET | Freq: Every day | ORAL | Status: DC
  Administered 2020-02-20 – 2020-06-07 (×108): 400 mg via ORAL
  Filled 2020-02-19 (×110): qty 2

## 2020-02-19 MED ORDER — LURASIDONE HCL 40 MG PO TABS
40.0000 mg | ORAL_TABLET | Freq: Every day | ORAL | Status: DC
  Administered 2020-02-19: 40 mg via ORAL
  Filled 2020-02-19: qty 1

## 2020-02-19 MED ORDER — GABAPENTIN 100 MG PO CAPS
100.0000 mg | ORAL_CAPSULE | Freq: Three times a day (TID) | ORAL | Status: DC
  Administered 2020-02-19 (×2): 100 mg via ORAL
  Filled 2020-02-19 (×3): qty 1

## 2020-02-19 MED ORDER — LORATADINE 10 MG PO TABS
10.0000 mg | ORAL_TABLET | Freq: Every day | ORAL | Status: DC
  Administered 2020-02-19: 10 mg via ORAL
  Filled 2020-02-19: qty 1

## 2020-02-19 MED ORDER — LORAZEPAM 1 MG PO TABS
1.0000 mg | ORAL_TABLET | Freq: Three times a day (TID) | ORAL | Status: DC
  Administered 2020-02-19: 1 mg via ORAL
  Filled 2020-02-19: qty 1

## 2020-02-19 MED ORDER — INSULIN DETEMIR 100 UNIT/ML ~~LOC~~ SOLN
10.0000 [IU] | Freq: Every day | SUBCUTANEOUS | Status: DC
  Filled 2020-02-19: qty 0.1

## 2020-02-19 MED ORDER — ASPIRIN EC 81 MG PO TBEC
81.0000 mg | DELAYED_RELEASE_TABLET | Freq: Every day | ORAL | Status: DC
  Administered 2020-02-19: 81 mg via ORAL
  Filled 2020-02-19: qty 1

## 2020-02-19 MED ORDER — METOPROLOL SUCCINATE ER 50 MG PO TB24
50.0000 mg | ORAL_TABLET | Freq: Every day | ORAL | Status: DC
  Administered 2020-02-19: 50 mg via ORAL
  Filled 2020-02-19: qty 1

## 2020-02-19 MED ORDER — HYDROXYZINE HCL 10 MG PO TABS
10.0000 mg | ORAL_TABLET | Freq: Three times a day (TID) | ORAL | Status: DC | PRN
  Administered 2020-02-19 – 2020-05-31 (×15): 10 mg via ORAL
  Filled 2020-02-19 (×15): qty 1

## 2020-02-19 MED ORDER — PANTOPRAZOLE SODIUM 40 MG PO TBEC
40.0000 mg | DELAYED_RELEASE_TABLET | Freq: Every day | ORAL | Status: DC
  Administered 2020-02-19: 40 mg via ORAL
  Filled 2020-02-19: qty 1

## 2020-02-19 MED ORDER — FUROSEMIDE 20 MG PO TABS
20.0000 mg | ORAL_TABLET | Freq: Every day | ORAL | Status: DC
  Administered 2020-02-19: 20 mg via ORAL
  Filled 2020-02-19: qty 0.5
  Filled 2020-02-19: qty 1

## 2020-02-19 MED ORDER — INSULIN ASPART 100 UNIT/ML ~~LOC~~ SOLN
0.0000 [IU] | Freq: Three times a day (TID) | SUBCUTANEOUS | Status: DC
  Administered 2020-02-19 (×2): 1 [IU] via SUBCUTANEOUS
  Filled 2020-02-19 (×2): qty 1

## 2020-02-19 MED ORDER — LAMOTRIGINE 100 MG PO TABS: 100.0000 mg | ORAL_TABLET | Freq: Every day | ORAL | Status: DC

## 2020-02-19 MED ORDER — QUETIAPINE FUMARATE 200 MG PO TABS
200.0000 mg | ORAL_TABLET | ORAL | Status: AC
  Administered 2020-02-19: 200 mg via ORAL
  Filled 2020-02-19: qty 1

## 2020-02-19 MED ORDER — TAMSULOSIN HCL 0.4 MG PO CAPS
0.4000 mg | ORAL_CAPSULE | Freq: Every day | ORAL | Status: DC
  Administered 2020-02-19: 0.4 mg via ORAL
  Filled 2020-02-19: qty 1

## 2020-02-19 MED ORDER — MAGNESIUM HYDROXIDE 400 MG/5ML PO SUSP
30.0000 mL | Freq: Every day | ORAL | Status: DC | PRN
  Administered 2020-03-04 – 2020-03-23 (×5): 30 mL via ORAL
  Filled 2020-02-19 (×5): qty 30

## 2020-02-19 NOTE — Progress Notes (Signed)
Patient ID: [facility], male   DOB: Apr 15, 1956, 64 y.o.   MRN: [id number]    Very Brief Note  Patient of Dr. Algie Coffer who is scheduled for admit down to Behav Unit  Home meds and transfer orders written   Rama Anne Fu MD

## 2020-02-19 NOTE — ED Notes (Signed)

## 2020-02-19 NOTE — ED Triage Notes (Signed)
Spoke with dr Jacqualine Code about pt. He will go see pt.

## 2020-02-19 NOTE — ED Notes (Signed)
Pt denies SI/HI but endorses AH all the time. Pt says the voices never stop. Pt anxious on assessment. Pt states "I need to tell you something. It's important". Needed to prompt pt before he says "I don't want Dr Weber Cooks to see me. I don't like him". Pt then told me to let everyone know this. Pt informed that he will be seen by a different psych MD while in the ED.

## 2020-02-19 NOTE — ED Notes (Signed)
INVOLUNTARY all papers on chart, awaiting TTS/PSYCH consult

## 2020-02-19 NOTE — ED Triage Notes (Signed)
Pt running through ER screaming "help me, help me".  Pt has schizoaffective DO as well as autism. Visitor with pt. Placed in family wait for comfort and to help calm pt.

## 2020-02-19 NOTE — ED Notes (Signed)
INVOLUNTARY to be admitted to [facility] after 830PM today

## 2020-02-19 NOTE — BH Assessment (Signed)
Patient is to be admitted to [facility] by [name].  Attending Physician will be [name].   Patient has been assigned to room 316, by Belt Nurse Demetria, RN.   Intake Paper Work has been signed and placed on patient chart.  ER staff is aware of the admission: Glenda, ER Secretary   Dr.Quale, ER MD  Earlie Server, Patient's Nurse  Tho Patient Access.  Pt can be transported to BMU after 8:30 PM.

## 2020-02-19 NOTE — ED Provider Notes (Signed)
[facility] Emergency Department Provider Note   ____________________________________________   None    (approximate)  I have reviewed the triage vital signs and the nursing notes.   HISTORY  Chief Complaint Anxiety    HPI Alexander Beard is a 64 y.o. male see history of anxiety, autistic disorder, schizoaffective disorder  Patient resides in a group home.  He is losing his group home at the end of this week.  Family report he has nowhere to stay or live.    Patient is very anxious, will just left [facility] hospital yesterday and they drove here because he had nowhere to return home to.  They do work with his outpatient psychiatrist who advised that he come here.  He does not want to hurt anyone else.  He does have thoughts about hurting himself which seem to be chronic in nature but worsened recently  Normally lives with his brother, but brother has had medical condition that has not allowed for him to be able to assist and stay with him  Patient reports severe anxiety.  Severe anxiety state.  Family member who is with him his stepbrother also reports that he has autism and that he has been in and out of many group homes, having lots of difficulty like this over several years.  Now has nowhere to live or reside in a feel that he needs to likely be admitted to the hospital after having discussed with Dr. Lissa Hoard packs  Past Medical History:  Diagnosis Date  . Anxiety   . Anxiety disorder due to known physiological condition    HOSPITALIZED 10/18  . Arthritis   . Autistic disorder, residual state   . COPD (chronic obstructive pulmonary disease) ([place])   . Depression   . Developmental disorder   . Diabetes mellitus without complication ([place])   . Dyslipidemia   . Esophageal reflux   . HOH (hard of hearing)    MILDLY  . Hypertension   . Obesity   . Overactive bladder   . Palpitations    ANXIETY  . Schizophrenia, schizoaffective ([place])   . Sleep apnea   .  Tremors of nervous system    HANDS DUE TO MEDICATIONS  . Urinary incontinence     Patient Active Problem List   Diagnosis Date Noted  . Diabetes ([place]) 04/10/2015  . Hypertension 04/10/2015  . Prostate hypertrophy 04/10/2015  . Schizoaffective disorder ([place]) 04/10/2015  . Enlarged prostate 04/10/2015  . Essential (primary) hypertension 04/10/2015  . Type 2 diabetes mellitus ([place]) 04/10/2015  . Controlled type 2 diabetes mellitus without complication ([place]) 75/17/0017  . Schizoaffective disorder, bipolar type ([place]) 12/29/2014  . Active autistic disorder 12/29/2014    Past Surgical History:  Procedure Laterality Date  . CATARACT EXTRACTION W/PHACO Left 11/14/2017   Procedure: CATARACT EXTRACTION PHACO AND INTRAOCULAR LENS PLACEMENT (IOC);  Surgeon: Birder Robson, MD;  Location: [facility] ORS;  Service: Ophthalmology;  Laterality: Left;  [place] 00:42 AP% 14.6 CDE 6.12 Fluid pack lot # [id number]  . COLONOSCOPY  01/28/2005  . COLONOSCOPY WITH PROPOFOL N/A 05/09/2016   Procedure: COLONOSCOPY WITH PROPOFOL;  Surgeon: Manya Silvas, MD;  Location: [facility] ENDOSCOPY;  Service: Endoscopy;  Laterality: N/A;  . ESOPHAGOGASTRODUODENOSCOPY N/A 05/09/2016   Procedure: ESOPHAGOGASTRODUODENOSCOPY (EGD);  Surgeon: Manya Silvas, MD;  Location: [facility] ENDOSCOPY;  Service: Endoscopy;  Laterality: N/A;  . FRACTURE SURGERY     ORIF SHOULDER  . TONSILLECTOMY      Prior to Admission medications  Medication Sig Start Date End Date Taking? Authorizing Provider  acetaminophen (TYLENOL) 650 MG CR tablet Take 650 mg by mouth every 12 (twelve) hours as needed for pain.    [provider]  aspirin EC 81 MG tablet Take 81 mg by mouth daily.    [provider]  Calcium Carbonate-Vitamin D3 (CALCIUM 600-D) 600-400 MG-UNIT TABS Take 1 tablet by mouth 2 (two) times daily.    [provider]  Cholecalciferol (VITAMIN D-1000 MAX ST) 1000 UNITS tablet Take 1,000 Units by mouth daily.      [provider]  docusate sodium (COLACE) 100 MG capsule Take 100 mg by mouth daily.     [provider]  furosemide (LASIX) 20 MG tablet Take 20 mg by mouth daily.     [provider]  gabapentin (NEURONTIN) 100 MG capsule Take 1 capsule (100 mg total) by mouth 3 (three) times daily. 09/19/19   [name], [name], MD  hydrocortisone (ANUSOL-HC) 2.5 % rectal cream Place 1 application rectally 4 (four) times daily as needed for hemorrhoids or anal itching.    [provider]  Insulin Detemir (LEVEMIR FLEXTOUCH) 100 UNIT/ML Pen Inject 15 Units into the skin daily at 10 pm.    [provider]  lamoTRIgine (LAMICTAL) 100 MG tablet Take 1 tablet (100 mg total) by mouth at bedtime. 09/19/19   [name], [name], MD  lisinopril (PRINIVIL,ZESTRIL) 5 MG tablet Take 5 mg by mouth daily.     [provider]  loratadine (CLARITIN) 10 MG tablet Take 1 tablet (10 mg total) by mouth daily. 04/30/15   [name], [name], MD  LORazepam (ATIVAN) 1 MG tablet Take 1 tablet (1 mg total) by mouth 3 (three) times daily. 12/31/19   [name], [name], MD  lurasidone (LATUDA) 40 MG TABS tablet Take 1 tablet (40 mg total) by mouth daily. with food 09/19/19   [name], [name], MD  meloxicam (MOBIC) 15 MG tablet Take 15 mg by mouth daily.    [provider]  Menthol (BIOFREEZE) 10 % AERO Apply 1 application topically as directed.    [provider]  metoprolol succinate (TOPROL-XL) 50 MG 24 hr tablet Take 50 mg by mouth daily.     [provider]  naproxen (NAPROSYN) 500 MG tablet TAKE ONE TABLET BY MOUTH 2 TIMES A DAY AS NEEDED FOR PAIN *TAKE WITH FOOD* 04/21/16   [name], [name], MD  NON FORMULARY cpap device    [provider]  pantoprazole (PROTONIX) 40 MG tablet Take 40 mg by mouth daily.    [provider]  permethrin (ELIMITE) 5 % cream Apply 1 application topically as directed.    [provider]  polyethylene glycol (MIRALAX /  GLYCOLAX) packet Take 17 g by mouth daily.    [provider]  QUEtiapine (SEROQUEL) 100 MG tablet TAKE 1 TABLET  BY MOUTH THREE TIMES A DAY 11/15/19   [name], [name], MD  QUEtiapine (SEROQUEL) 400 MG tablet Take 1 tablet (400 mg total) by mouth at bedtime. 09/19/19   [name], [name], MD  QUEtiapine (SEROQUEL) 50 MG tablet Take 3 tablets (150 mg total) by mouth daily as needed (anxiety). 12/31/19   [name], [name], MD  sertraline (ZOLOFT) 100 MG tablet Take 2 tablets (200 mg total) by mouth daily. 09/19/19   [name], [name], MD  sertraline (ZOLOFT) 50 MG tablet Take 2 tablets (100 mg total) by mouth daily. 09/19/19   [name], [name], MD  simethicone (MYLICON) 80 MG chewable tablet Chew 80-160 mg by mouth 2 (two) times daily as needed for flatulence.    [provider]  simvastatin (ZOCOR) 20 MG tablet Take 20 mg by mouth at bedtime.     [provider]  sitaGLIPtin (JANUVIA) 100 MG tablet Take 100 mg by mouth daily.    [provider]  solifenacin (VESICARE) 5 MG tablet Take 5 mg by mouth daily.    [provider]  Starch (HEMORRHOIDAL RE) Place 1 application rectally 4 (four) times daily as needed (for itching).    [provider]  tamsulosin (FLOMAX) 0.4 MG CAPS capsule Take 0.4 mg by mouth daily.    [provider]  tolnaftate (TINACTIN) 1 % spray Apply 1 application topically daily.    [provider]    Allergies Other, Penicillins, and Cortizone-10 [hydrocortisone]  Family History  Problem Relation Age of Onset  . Hypertension Mother   . Stroke Father   . Heart Problems Father     Social History Social History   Tobacco Use  . Smoking status: Never Smoker  . Smokeless tobacco: Never Used  Vaping Use  . Vaping Use: Never used  Substance Use Topics  . Alcohol use: No    Alcohol/week: 0.0 standard drinks  . Drug use: No    Review of Systems Constitutional: No fever/chills or recent illness.  No known Covid  exposure. Eyes: No visual changes. ENT: No sore throat. Cardiovascular: Denies chest pain. Respiratory: Denies shortness of breath. Gastrointestinal: No abdominal pain.   Genitourinary: Negative for dysuria. Musculoskeletal: Negative for back pain. Skin: Negative for rash. Neurological: Negative for headaches, areas of focal weakness or numbness. Psychiatric: Reports he is very very anxious   ____________________________________________   PHYSICAL EXAM:  VITAL SIGNS: ED Triage Vitals  Enc Vitals Group     BP 02/18/20 2000 122/69     Pulse Rate 02/18/20 2000 (!) 59     Resp 02/18/20 2000 20     Temp 02/18/20 2000 98.2 F (36.8 C)     Temp Source 02/18/20 2000 Oral     SpO2 02/18/20 2000 97 %     Weight 02/18/20 2013 229 lb 15 oz (104.3 kg)     Height 02/18/20 2013 5\' 8"  (1.727 m)     Head Circumference --      Peak Flow --      Pain Score 02/18/20 2013 0     Pain Loc --      Pain Edu? --      Excl. in [place]? --     Constitutional: Alert and oriented. Well appearing and in no acute distress.  He is very anxious.  Elevated mood.  Gets very upset very easily when discussing possibly not being able to live with his brother who is been having significant medical problems Eyes: Conjunctivae are normal. Head: Atraumatic. Nose: No congestion/rhinnorhea. Mouth/Throat: Mucous membranes are moist. Neck: No stridor.  Cardiovascular: Normal rate, regular rhythm Respiratory: Normal respiratory effort.  No retractions.  Gastrointestinal: Soft and nontender. No distention. Musculoskeletal: Walks with normal gait. Neurologic:  Normal speech and language. No gross focal neurologic deficits are appreciated.  Skin:  Skin is warm, dry and intact. No rash noted. Psychiatric: Mood and affect are elevated very anxious, seems manic frequently floating from one thought or idea to another, denies acute suicidal thoughts but reports chronic occasional suicidality.  Does not actively want to hurt  himself right now but was having suicidal thoughts.  ____________________________________________   LABS (all labs ordered are listed, but only abnormal results are displayed)  Labs Reviewed  GLUCOSE, CAPILLARY - Abnormal; Notable for the following components:      Result Value   Glucose-Capillary 150 (*)    All other components within normal limits  SARS CORONAVIRUS 2 BY RT PCR (HOSPITAL ORDER, [facility])  CBG MONITORING, ED   ____________________________________________  EKG   ____________________________________________  RADIOLOGY   ____________________________________________   PROCEDURES  Procedure(s) performed: None  Procedures  Critical Care performed: No  ____________________________________________   INITIAL IMPRESSION / ASSESSMENT AND PLAN / ED COURSE  Pertinent labs & imaging results that were available during my care of the patient were reviewed by me and considered in my medical decision making (see chart for details).   Case reviewed with psychiatry, they will plan to admit the patient here.  [name] advising he will place admission orders.  Patient does have a documented long history of psychiatric disease, was recently evaluated at [facility] for similar.  I do not believe that he needs repeat lab testing, I was able to review his recent labs from [facility] from just a couple days ago.  He went from [place] directly here.  Home medications ordered, diabetes nurse consult placed for order recommendations as well and follow-up his diabetes.      ----------------------------------------- 2:49 PM on 02/19/2020 -----------------------------------------  Patient awaiting admission to psychiatry.  ____________________________________________   FINAL CLINICAL IMPRESSION(S) / ED DIAGNOSES  Final diagnoses:  Schizoaffective disorder, unspecified type ([place])        Note:  This document was prepared  using Dragon voice recognition software and may include unintentional dictation errors       Delman Kitten, MD 02/19/20 1449

## 2020-02-19 NOTE — ED Notes (Signed)
Pt still very agitated. Being ivc by dr Jacqualine Code.  Given food and drink.

## 2020-02-19 NOTE — Progress Notes (Signed)
Inpatient Diabetes Program Recommendations  AACE/ADA: New Consensus Statement on Inpatient Glycemic Control   Target Ranges:  Prepandial:   less than 140 mg/dL      Peak postprandial:   less than 180 mg/dL (1-2 hours)      Critically ill patients:  140 - 180 mg/dL  Results for ADD, DINAPOLI (MRN [id number]) as of 02/19/2020 12:41  Ref. Range 02/19/2020 09:26  Glucose-Capillary Latest Ref Range: 70 - 99 mg/dL 150 (H)    Review of Glycemic Control  Diabetes history: DM2 Outpatient Diabetes medications: Levemir 15 units QHS, Januvia 100 mg daily Current orders for Inpatient glycemic control: Levemir 10 units QHS, Novolog 0-6 units TID with meals  Inpatient Diabetes Program Recommendations:    NOTE: Noted patient in ED under IVC. Patient is from group home and has noted hx of schizoaffective disorder and autism. Most current glucose 150 mg/dl. Agree with current orders for glycemic control. Will continue to follow while inpatient.  Thanks, Barnie Alderman, RN, MSN, CDE Diabetes Coordinator Inpatient Diabetes Program [phone] (Team Pager from 8am to 5pm)

## 2020-02-19 NOTE — ED Notes (Signed)
Pt given meal tray.

## 2020-02-19 NOTE — ED Notes (Addendum)
Security able to descalate patient, and is talking to him, I called to let her know that the charge RN is working on getting a room available for him. Brother sitting with patient.

## 2020-02-19 NOTE — ED Notes (Signed)
Pt sitting with brother, beginning to yell and states "he can't take it anymore" he does not want to wait, pt now yelling "I want to die"  Security talking to patient at this time, but he is becoming more belligerent in the lobby

## 2020-02-20 ENCOUNTER — Encounter: Payer: Self-pay | Admitting: Internal Medicine

## 2020-02-20 DIAGNOSIS — F250 Schizoaffective disorder, bipolar type: Principal | ICD-10-CM

## 2020-02-20 LAB — HEMOGLOBIN A1C
Hgb A1c MFr Bld: 6.6 % — ABNORMAL HIGH (ref 4.8–5.6)
Mean Plasma Glucose: 142.72 mg/dL

## 2020-02-20 LAB — TSH: TSH: 2.866 u[IU]/mL (ref 0.350–4.500)

## 2020-02-20 LAB — GLUCOSE, CAPILLARY
Glucose-Capillary: 193 mg/dL — ABNORMAL HIGH (ref 70–99)
Glucose-Capillary: 79 mg/dL (ref 70–99)

## 2020-02-20 MED ORDER — DOCUSATE SODIUM 100 MG PO CAPS
100.0000 mg | ORAL_CAPSULE | Freq: Two times a day (BID) | ORAL | Status: DC
  Administered 2020-02-20 – 2020-04-05 (×89): 100 mg via ORAL
  Filled 2020-02-20 (×91): qty 1

## 2020-02-20 MED ORDER — GABAPENTIN 100 MG PO CAPS
100.0000 mg | ORAL_CAPSULE | Freq: Three times a day (TID) | ORAL | Status: DC
  Administered 2020-02-20 – 2020-06-08 (×323): 100 mg via ORAL
  Filled 2020-02-20 (×323): qty 1

## 2020-02-20 MED ORDER — ASPIRIN EC 81 MG PO TBEC
81.0000 mg | DELAYED_RELEASE_TABLET | Freq: Every day | ORAL | Status: DC
  Administered 2020-02-20 – 2020-06-08 (×110): 81 mg via ORAL
  Filled 2020-02-20 (×110): qty 1

## 2020-02-20 MED ORDER — LORATADINE 10 MG PO TABS
10.0000 mg | ORAL_TABLET | Freq: Every day | ORAL | Status: DC
  Administered 2020-02-20 – 2020-06-08 (×110): 10 mg via ORAL
  Filled 2020-02-20 (×113): qty 1

## 2020-02-20 MED ORDER — PANTOPRAZOLE SODIUM 40 MG PO TBEC
40.0000 mg | DELAYED_RELEASE_TABLET | Freq: Every day | ORAL | Status: DC
  Administered 2020-02-20 – 2020-06-08 (×110): 40 mg via ORAL
  Filled 2020-02-20 (×110): qty 1

## 2020-02-20 MED ORDER — OXYBUTYNIN CHLORIDE 5 MG PO TABS
5.0000 mg | ORAL_TABLET | Freq: Every day | ORAL | Status: DC
  Administered 2020-02-21 – 2020-02-22 (×2): 5 mg via ORAL
  Filled 2020-02-20 (×2): qty 1

## 2020-02-20 MED ORDER — LINAGLIPTIN 5 MG PO TABS
5.0000 mg | ORAL_TABLET | Freq: Every day | ORAL | Status: DC
  Administered 2020-02-20 – 2020-06-08 (×110): 5 mg via ORAL
  Filled 2020-02-20 (×110): qty 1

## 2020-02-20 MED ORDER — METFORMIN HCL 500 MG PO TABS
500.0000 mg | ORAL_TABLET | Freq: Two times a day (BID) | ORAL | Status: DC
  Administered 2020-02-20 – 2020-02-24 (×8): 500 mg via ORAL
  Filled 2020-02-20 (×9): qty 1

## 2020-02-20 MED ORDER — SERTRALINE HCL 100 MG PO TABS
100.0000 mg | ORAL_TABLET | Freq: Two times a day (BID) | ORAL | Status: DC
  Administered 2020-02-20 – 2020-04-13 (×106): 100 mg via ORAL
  Filled 2020-02-20 (×106): qty 1

## 2020-02-20 MED ORDER — INSULIN ASPART 100 UNIT/ML ~~LOC~~ SOLN
0.0000 [IU] | Freq: Three times a day (TID) | SUBCUTANEOUS | Status: DC
  Administered 2020-02-20: 2 [IU] via SUBCUTANEOUS
  Administered 2020-02-21: 1 [IU] via SUBCUTANEOUS
  Administered 2020-02-21: 2 [IU] via SUBCUTANEOUS
  Administered 2020-02-24 – 2020-02-25 (×2): 1 [IU] via SUBCUTANEOUS
  Filled 2020-02-20 (×4): qty 1

## 2020-02-20 MED ORDER — LAMOTRIGINE 100 MG PO TABS
100.0000 mg | ORAL_TABLET | Freq: Every day | ORAL | Status: DC
  Administered 2020-02-20 – 2020-05-21 (×89): 100 mg via ORAL
  Filled 2020-02-20 (×91): qty 1

## 2020-02-20 MED ORDER — INSULIN ASPART 100 UNIT/ML ~~LOC~~ SOLN: 0.0000 [IU] | Freq: Every day | SUBCUTANEOUS | Status: DC

## 2020-02-20 MED ORDER — CLONAZEPAM 0.5 MG PO TABS
0.5000 mg | ORAL_TABLET | Freq: Three times a day (TID) | ORAL | Status: DC
  Administered 2020-02-20 – 2020-02-21 (×3): 0.5 mg via ORAL
  Filled 2020-02-20 (×3): qty 1

## 2020-02-20 MED ORDER — METOPROLOL SUCCINATE ER 25 MG PO TB24
50.0000 mg | ORAL_TABLET | Freq: Every day | ORAL | Status: DC
  Administered 2020-02-20 – 2020-06-08 (×102): 50 mg via ORAL
  Filled 2020-02-20 (×109): qty 2

## 2020-02-20 MED ORDER — SIMVASTATIN 40 MG PO TABS
20.0000 mg | ORAL_TABLET | Freq: Every day | ORAL | Status: DC
  Administered 2020-02-20 – 2020-06-07 (×109): 20 mg via ORAL
  Filled 2020-02-20 (×110): qty 1

## 2020-02-20 NOTE — Tx Team (Addendum)
Interdisciplinary Treatment and Diagnostic Plan Update  02/20/2020 Time of Session: 9:00AM Alexander Beard MRN: [id number]  Principal Diagnosis: <principal problem not specified>  Secondary Diagnoses: Active Problems:   Disruptive behavior disorder   Current Medications:  Current Facility-Administered Medications  Medication Dose Route Frequency Provider Last Rate Last Admin   acetaminophen (TYLENOL) tablet 650 mg  650 mg Oral Q6H PRN Alexander Post, MD       alum & mag hydroxide-simeth (MAALOX/MYLANTA) 200-200-20 MG/5ML suspension 30 mL  30 mL Oral Q4H PRN Alexander Post, MD       hydrOXYzine (ATARAX/VISTARIL) tablet 10 mg  10 mg Oral TID PRN Alexander Post, MD   10 mg at 02/19/20 2218   magnesium hydroxide (MILK OF MAGNESIA) suspension 30 mL  30 mL Oral Daily PRN Alexander Post, MD       QUEtiapine (SEROQUEL) tablet 400 mg  400 mg Oral QHS Alexander Sauger, NP   400 mg at 02/20/20 0015   PTA Medications: Medications Prior to Admission  Medication Sig Dispense Refill Last Dose   acetaminophen (TYLENOL) 650 MG CR tablet Take 650 mg by mouth every 12 (twelve) hours as needed for pain.      aspirin EC 81 MG tablet Take 81 mg by mouth daily.      Calcium Carbonate-Vitamin D3 (CALCIUM 600-D) 600-400 MG-UNIT TABS Take 1 tablet by mouth 2 (two) times daily.      Cholecalciferol (VITAMIN D-1000 MAX ST) 1000 UNITS tablet Take 1,000 Units by mouth daily.       docusate sodium (COLACE) 100 MG capsule Take 100 mg by mouth daily.       furosemide (LASIX) 20 MG tablet Take 20 mg by mouth daily.       gabapentin (NEURONTIN) 100 MG capsule Take 1 capsule (100 mg total) by mouth 3 (three) times daily. 90 capsule 10    Insulin Detemir (LEVEMIR FLEXTOUCH) 100 UNIT/ML Pen Inject 15 Units into the skin daily at 10 pm.      lamoTRIgine (LAMICTAL) 100 MG tablet Take 1 tablet (100 mg total) by mouth at bedtime. 30 tablet 10    loratadine (CLARITIN) 10 MG tablet Take 1 tablet (10 mg  total) by mouth daily. 30 tablet 5    LORazepam (ATIVAN) 1 MG tablet Take 1 tablet (1 mg total) by mouth 3 (three) times daily. 90 tablet 5    lurasidone (LATUDA) 40 MG TABS tablet Take 1 tablet (40 mg total) by mouth daily. with food 30 tablet 10    metoprolol succinate (TOPROL-XL) 50 MG 24 hr tablet Take 50 mg by mouth daily.       NON FORMULARY cpap device      oxybutynin (DITROPAN) 5 MG tablet Take 5 mg by mouth daily.      pantoprazole (PROTONIX) 40 MG tablet Take 40 mg by mouth daily.      polyethylene glycol (MIRALAX / GLYCOLAX) packet Take 17 g by mouth daily.      QUEtiapine (SEROQUEL) 100 MG tablet TAKE 1 TABLET  BY MOUTH THREE TIMES A DAY (Patient taking differently: Take 100 mg by mouth 3 (three) times daily. TAKE 1 TABLET  BY MOUTH THREE TIMES A DAY) 90 tablet 11    QUEtiapine (SEROQUEL) 400 MG tablet Take 1 tablet (400 mg total) by mouth at bedtime. 30 tablet 10    sertraline (ZOLOFT) 100 MG tablet Take 2 tablets (200 mg total) by mouth daily. (Patient taking differently: Take 200 mg by mouth daily. Take along with  two 50 mg tablets (100 mg) for total 300 mg daily) 60 tablet 5    sertraline (ZOLOFT) 50 MG tablet Take 2 tablets (100 mg total) by mouth daily. (Patient taking differently: Take 100 mg by mouth daily. Take along with two 100 mg tablets (200 mg) for total 300 mg daily) 60 tablet 5    simethicone (MYLICON) 80 MG chewable tablet Chew 80-160 mg by mouth 2 (two) times daily as needed for flatulence.      simvastatin (ZOCOR) 20 MG tablet Take 20 mg by mouth at bedtime.       sitaGLIPtin (JANUVIA) 100 MG tablet Take 100 mg by mouth daily.      solifenacin (VESICARE) 5 MG tablet Take 5 mg by mouth daily.      Starch (HEMORRHOIDAL RE) Place 1 application rectally 4 (four) times daily as needed (for itching).      tamsulosin (FLOMAX) 0.4 MG CAPS capsule Take 0.4 mg by mouth daily.       Patient Stressors: Health problems Marital or family conflict Medication  change or noncompliance Traumatic event  Patient Strengths: Motivation for treatment/growth Religious Affiliation  Treatment Modalities: Medication Management, Group therapy, Case management,  1 to 1 session with clinician, Psychoeducation, Recreational therapy.   Physician Treatment Plan for Primary Diagnosis: <principal problem not specified> Long Term Goal(s):     Short Term Goals:    Medication Management: Evaluate patient's response, side effects, and tolerance of medication regimen.  Therapeutic Interventions: 1 to 1 sessions, Unit Group sessions and Medication administration.  Evaluation of Outcomes: Not Met  Physician Treatment Plan for Secondary Diagnosis: Active Problems:   Disruptive behavior disorder  Long Term Goal(s):     Short Term Goals:       Medication Management: Evaluate patient's response, side effects, and tolerance of medication regimen.  Therapeutic Interventions: 1 to 1 sessions, Unit Group sessions and Medication administration.  Evaluation of Outcomes: Not Met   RN Treatment Plan for Primary Diagnosis: <principal problem not specified> Long Term Goal(s): Knowledge of disease and therapeutic regimen to maintain health will improve  Short Term Goals: Ability to demonstrate self-control, Ability to participate in decision making will improve, Ability to verbalize feelings will improve, Ability to disclose and discuss suicidal ideas, Ability to identify and develop effective coping behaviors will improve and Compliance with prescribed medications will improve  Medication Management: RN will administer medications as ordered by provider, will assess and evaluate patient's response and provide education to patient for prescribed medication. RN will report any adverse and/or side effects to prescribing provider.  Therapeutic Interventions: 1 on 1 counseling sessions, Psychoeducation, Medication administration, Evaluate responses to treatment, Monitor  vital signs and CBGs as ordered, Perform/monitor CIWA, COWS, AIMS and Fall Risk screenings as ordered, Perform wound care treatments as ordered.  Evaluation of Outcomes: Not Met   LCSW Treatment Plan for Primary Diagnosis: <principal problem not specified> Long Term Goal(s): Safe transition to appropriate next level of care at discharge, Engage patient in therapeutic group addressing interpersonal concerns.  Short Term Goals: Engage patient in aftercare planning with referrals and resources, Increase social support, Increase ability to appropriately verbalize feelings, Increase emotional regulation and Increase skills for wellness and recovery  Therapeutic Interventions: Assess for all discharge needs, 1 to 1 time with Social worker, Explore available resources and support systems, Assess for adequacy in community support network, Educate family and significant other(s) on suicide prevention, Complete Psychosocial Assessment, Interpersonal group therapy.  Evaluation of Outcomes: Not Met  Progress in Treatment: Attending groups: Yes. Participating in groups: Yes. Taking medication as prescribed: Yes. Toleration medication: Yes. Family/Significant other contact made: Yes, individual(s) contacted:  once permission will be given.  Patient understands diagnosis: Yes. Discussing patient identified problems/goals with staff: Yes. Medical problems stabilized or resolved: Yes. Denies suicidal/homicidal ideation: Yes. Issues/concerns per patient self-inventory: No. Other: none  New problem(s) identified: No, Describe:  none  New Short Term/Long Term Goal(s):  , elimination of symptoms of psychosis, medication management for mood stabilization; elimination of SI thoughts; development of comprehensive mental wellness/sobriety plan.   Patient Goals:  "i'm thinking all the time about my future"  Discharge Plan or Barriers: CSW will assist the patient in developing appropriate aftercare plans.     Reason for Continuation of Hospitalization: Anxiety Depression Medication stabilization Suicidal ideation  Estimated Length of Stay:  1-7 days   Recreational Therapy: Patient: N/A Patient Goal: Patient will engage in groups without prompting or encouragement from LRT x3 group sessions within 5 recreation therapy group sessions  Attendees: Patient:  Alexander Beard 02/20/2020 11:04 AM  Physician: [name], MD 02/20/2020 11:04 AM  Nursing: Collier Bullock, RN 02/20/2020 11:04 AM  RN Care Manager: 02/20/2020 11:04 AM  Social Worker: Assunta Curtis, LCSW 02/20/2020 11:04 AM  Recreational Therapist: Roanna Epley, CTRS, LRT 02/20/2020 11:04 AM  Other:  02/20/2020 11:04 AM  Other:  02/20/2020 11:04 AM  Other: 02/20/2020 11:04 AM    Scribe for Treatment Team: [name], LCSW 02/20/2020 11:04 AM

## 2020-02-20 NOTE — H&P (Signed)
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  Patient well known from many previous encounters.  Patient is a 64 year old man who presented to our emergency room with symptoms of anxiety confusion and agitation.  Patient's agitation behavior problems and mood instability have been getting worse for the last few months.  He has had multiple episodes of running away from his group home running to police stations screaming running out into traffic.  Sleep has been worse than usual.  Patient says that he cannot stop himself thinking about negative things and feeling frightened all the time.  He denies specific hallucinations.  He denies acute suicidal or homicidal ideation.  He has been compliant with outpatient medication.  No alcohol or drugs involved.  Major life stresses have occurred.  He has now received a 30-day notice from his group home and has no place to stay.  His brother who is his closest relative has recently been diagnosed with cancer and is very incapacitated. Associated Signs/Symptoms: Depression Symptoms:  anhedonia, difficulty concentrating, hopelessness, anxiety, (Hypo) Manic Symptoms:  Impulsivity, Irritable Mood, Anxiety Symptoms:  Excessive Worry, Psychotic Symptoms:  Ideas of Reference, PTSD Symptoms: Negative Total Time spent with patient: 1 hour  Past Psychiatric History: Patient has a lifelong history of impairment.  Carries a diagnosis of schizoaffective disorder.  Had a past history of multiple hospitalizations but has been able  to stay out of the hospital for years with outpatient treatment and support through a group home.  Past history of suicidal statements and impulsive behavior but no clear suicide attempts.  No history of violence to others.  Is the patient at risk to self? Yes.    Has the patient been a risk to self in the past 6 months? Yes.    Has the patient been a risk to self within the distant past? Yes.    Is the patient a risk to others? No.  Has the patient been a risk to others in the past 6 months? No.  Has the patient been a risk to others within the distant past? No.   Prior Inpatient Therapy:   Prior Outpatient Therapy:    Alcohol Screening: 1. How often do you have a drink containing alcohol?: Never 2. How many drinks containing alcohol do you have on a typical day when you are drinking?: 1 or 2 3. How often do you have six or more drinks on one occasion?: Never AUDIT-C Score: 0 4. How often during the last year have you found that you were not able to stop drinking once you had started?: Never 5. How often during the last year have you failed to do what was normally expected from you because of drinking?: Never 6. How often during the last year have you needed a first drink in the morning to get yourself going after a heavy drinking session?: Never 7. How often during the last year have you had a feeling of guilt of remorse after drinking?: Never 8. How often during the last year have you been unable to remember what happened the night before because you had been drinking?: Never 9. Have you  or someone else been injured as a result of your drinking?: No 10. Has a relative or friend or a doctor or another health worker been concerned about your drinking or suggested you cut down?: No Alcohol Use Disorder Identification Test Final Score (AUDIT): 0 Alcohol Brief Interventions/Follow-up: AUDIT Score <7 follow-up not indicated Substance Abuse History in the last 12 months:  No. Consequences of  Substance Abuse: Negative Previous Psychotropic Medications: Yes  Psychological Evaluations: Yes  Past Medical History:  Past Medical History:  Diagnosis Date   Anxiety    Anxiety disorder due to known physiological condition    HOSPITALIZED 10/18   Arthritis    Autistic disorder, residual state    COPD (chronic obstructive pulmonary disease) ([place])    Depression    Developmental disorder    Diabetes mellitus without complication (HCC)    Dyslipidemia    Esophageal reflux    HOH (hard of hearing)    MILDLY   Hypertension    Obesity    Overactive bladder    Palpitations    ANXIETY   Schizophrenia, schizoaffective ([place])    Sleep apnea    Tremors of nervous system    HANDS DUE TO MEDICATIONS   Urinary incontinence     Past Surgical History:  Procedure Laterality Date   CATARACT EXTRACTION W/PHACO Left 11/14/2017   Procedure: CATARACT EXTRACTION PHACO AND INTRAOCULAR LENS PLACEMENT ([place]);  Surgeon: Birder Robson, MD;  Location: [facility] ORS;  Service: Ophthalmology;  Laterality: Left;  [place] 00:42 AP% 14.6 CDE 6.12 Fluid pack lot # [id number]   COLONOSCOPY  01/28/2005   COLONOSCOPY WITH PROPOFOL N/A 05/09/2016   Procedure: COLONOSCOPY WITH PROPOFOL;  Surgeon: Manya Silvas, MD;  Location: [facility] ENDOSCOPY;  Service: Endoscopy;  Laterality: N/A;   ESOPHAGOGASTRODUODENOSCOPY N/A 05/09/2016   Procedure: ESOPHAGOGASTRODUODENOSCOPY (EGD);  Surgeon: Manya Silvas, MD;  Location: [facility] ENDOSCOPY;  Service: Endoscopy;  Laterality: N/A;   FRACTURE SURGERY     ORIF SHOULDER   TONSILLECTOMY     Family History:  Family History  Problem Relation Age of Onset   Hypertension Mother    Stroke Father    Heart Problems Father    Family Psychiatric  History: No clear family history Tobacco Screening:   Social History:  Social History   Substance and Sexual Activity  Alcohol Use No   Alcohol/week: 0.0 standard drinks     Social History   Substance and  Sexual Activity  Drug Use No    Additional Social History: Marital status: Single Does patient have children?: No                         Allergies:   Allergies  Allergen Reactions   Other Other (See Comments)    MYACINS   Penicillins Nausea And Vomiting   Cortizone-10 [Hydrocortisone] Rash   Lab Results:  Results for orders placed or performed during the hospital encounter of 02/19/20 (from the past 48 hour(s))  TSH     Status: None   Collection Time: 02/20/20  8:49 AM  Result Value Ref Range   TSH 2.866 0.350 - 4.500 uIU/mL    Comment: Performed by a 3rd Generation assay with a functional sensitivity of <=0.01 uIU/mL. Performed at [facility], [address] [place], [place] [address]     Blood Alcohol level:  Lab Results  Component Value Date   [facility] <10 01/22/2020   ETH <10 16/60/6301    Metabolic Disorder Labs:  Lab Results  Component Value Date   HGBA1C 5.6 04/10/2015   No results found for: PROLACTIN Lab Results  Component Value Date   CHOL 153 04/10/2015   TRIG 166 (H) 04/10/2015   HDL 50 04/10/2015   CHOLHDL 3.1 04/10/2015   VLDL 33 04/10/2015   LDLCALC 70 04/10/2015    Current Medications: Current Facility-Administered Medications  Medication Dose Route Frequency Provider Last Rate Last Admin   acetaminophen (TYLENOL) tablet 650 mg  650 mg Oral Q6H PRN [name], MD       alum & mag hydroxide-simeth (MAALOX/MYLANTA) 200-200-20 MG/5ML suspension 30 mL  30 mL Oral Q4H PRN [name], MD       aspirin EC tablet 81 mg  81 mg Oral Daily Zanetta Dehaan, [name], MD       clonazePAM (KLONOPIN) tablet 0.5 mg  0.5 mg Oral TID Devian Bartolomei, [name], MD       docusate sodium (COLACE) capsule 100 mg  100 mg Oral BID Teriah Muela, [name], MD       gabapentin (NEURONTIN) capsule 100 mg  100 mg Oral TID Emersynn Deatley, [name], MD       hydrOXYzine (ATARAX/VISTARIL) tablet 10 mg  10 mg Oral TID PRN [name], MD   10 mg at 02/20/20 1506    insulin aspart (novoLOG) injection 0-5 Units  0-5 Units Subcutaneous QHS Shatia Sindoni T, MD       insulin aspart (novoLOG) injection 0-9 Units  0-9 Units Subcutaneous TID WC Pheonix Wisby, [name], MD       lamoTRIgine (LAMICTAL) tablet 100 mg  100 mg Oral QHS Rona Tomson T, MD       linagliptin (TRADJENTA) tablet 5 mg  5 mg Oral Daily Salaam Battershell, [name], MD       loratadine (CLARITIN) tablet 10 mg  10 mg Oral Daily Yamna Mackel T, MD       magnesium hydroxide (MILK OF MAGNESIA) suspension 30 mL  30 mL Oral Daily PRN [name], MD       metFORMIN (GLUCOPHAGE) tablet 500 mg  500 mg Oral BID WC Taryn Nave, [name], MD       metoprolol succinate (TOPROL-XL) 24 hr tablet 50 mg  50 mg Oral Daily Cipriano [place], [name], MD       [START ON 02/21/2020] oxybutynin (DITROPAN) tablet 5 mg  5 mg Oral QAC breakfast Harveen Flesch T, MD       pantoprazole (PROTONIX) EC tablet 40 mg  40 mg Oral Daily Brennin Durfee T, MD       QUEtiapine (SEROQUEL) tablet 400 mg  400 mg Oral QHS [name], NP   400 mg at 02/20/20 0015   sertraline (ZOLOFT) tablet 100 mg  100 mg Oral BID Lajoya Dombek, [name], MD       simvastatin (ZOCOR) tablet 20 mg  20 mg Oral q1800 Oriana Horiuchi, [name], MD       PTA Medications: Medications Prior to Admission  Medication Sig Dispense Refill Last Dose   acetaminophen (TYLENOL) 650 MG CR tablet Take 650 mg by mouth every 12 (twelve) hours as needed for pain.      aspirin EC 81 MG tablet Take 81 mg by mouth daily.      Calcium Carbonate-Vitamin D3 (CALCIUM 600-D) 600-400 MG-UNIT TABS Take 1 tablet by mouth 2 (two) times daily.      Cholecalciferol (VITAMIN D-1000 MAX ST) 1000 UNITS tablet Take 1,000 Units by mouth daily.       docusate  sodium (COLACE) 100 MG capsule Take 100 mg by mouth daily.       furosemide (LASIX) 20 MG tablet Take 20 mg by mouth daily.       gabapentin (NEURONTIN) 100 MG capsule Take 1 capsule (100 mg total) by mouth 3 (three) times daily. 90 capsule 10     Insulin Detemir (LEVEMIR FLEXTOUCH) 100 UNIT/ML Pen Inject 15 Units into the skin daily at 10 pm.      lamoTRIgine (LAMICTAL) 100 MG tablet Take 1 tablet (100 mg total) by mouth at bedtime. 30 tablet 10    loratadine (CLARITIN) 10 MG tablet Take 1 tablet (10 mg total) by mouth daily. 30 tablet 5    LORazepam (ATIVAN) 1 MG tablet Take 1 tablet (1 mg total) by mouth 3 (three) times daily. 90 tablet 5    lurasidone (LATUDA) 40 MG TABS tablet Take 1 tablet (40 mg total) by mouth daily. with food 30 tablet 10    metoprolol succinate (TOPROL-XL) 50 MG 24 hr tablet Take 50 mg by mouth daily.       NON FORMULARY cpap device      oxybutynin (DITROPAN) 5 MG tablet Take 5 mg by mouth daily.      pantoprazole (PROTONIX) 40 MG tablet Take 40 mg by mouth daily.      polyethylene glycol (MIRALAX / GLYCOLAX) packet Take 17 g by mouth daily.      QUEtiapine (SEROQUEL) 100 MG tablet TAKE 1 TABLET  BY MOUTH THREE TIMES A DAY (Patient taking differently: Take 100 mg by mouth 3 (three) times daily. TAKE 1 TABLET  BY MOUTH THREE TIMES A DAY) 90 tablet 11    QUEtiapine (SEROQUEL) 400 MG tablet Take 1 tablet (400 mg total) by mouth at bedtime. 30 tablet 10    sertraline (ZOLOFT) 100 MG tablet Take 2 tablets (200 mg total) by mouth daily. (Patient taking differently: Take 200 mg by mouth daily. Take along with two 50 mg tablets (100 mg) for total 300 mg daily) 60 tablet 5    sertraline (ZOLOFT) 50 MG tablet Take 2 tablets (100 mg total) by mouth daily. (Patient taking differently: Take 100 mg by mouth daily. Take along with two 100 mg tablets (200 mg) for total 300 mg daily) 60 tablet 5    simethicone (MYLICON) 80 MG chewable tablet Chew 80-160 mg by mouth 2 (two) times daily as needed for flatulence.      simvastatin (ZOCOR) 20 MG tablet Take 20 mg by mouth at bedtime.       sitaGLIPtin (JANUVIA) 100 MG tablet Take 100 mg by mouth daily.      solifenacin (VESICARE) 5 MG tablet Take 5 mg by mouth daily.       Starch (HEMORRHOIDAL RE) Place 1 application rectally 4 (four) times daily as needed (for itching).      tamsulosin (FLOMAX) 0.4 MG CAPS capsule Take 0.4 mg by mouth daily.       Musculoskeletal: Strength & Muscle Tone: within normal limits Gait & Station: normal Patient leans: N/A  Psychiatric Specialty Exam: Physical Exam Constitutional:      Appearance: He is well-developed.  HENT:     Head: Normocephalic and atraumatic.  Eyes:     Conjunctiva/sclera: Conjunctivae normal.     Pupils: Pupils are equal, round, and reactive to light.  Cardiovascular:     Heart sounds: Normal heart sounds.  Pulmonary:     Effort: Pulmonary effort is normal.  Abdominal:     Palpations:  Abdomen is soft.  Musculoskeletal:        General: Normal range of motion.     Cervical back: Normal range of motion.  Skin:    General: Skin is warm and dry.  Neurological:     General: No focal deficit present.     Mental Status: He is alert.     Motor: Tremor present.     Comments: Tremor and abnormal movements consistent with tardive dyskinesia  Psychiatric:        Mood and Affect: Mood is anxious.        Speech: Speech is rapid and pressured and tangential.        Behavior: Behavior is agitated. Behavior is not aggressive.        Thought Content: Thought content does not include homicidal or suicidal ideation.        Cognition and Memory: Cognition is impaired.        Judgment: Judgment is impulsive.     Review of Systems  Constitutional: Negative.   HENT: Negative.   Eyes: Negative.   Respiratory: Negative.   Cardiovascular: Negative.   Gastrointestinal: Negative.   Musculoskeletal: Negative.   Skin: Negative.   Neurological: Negative.   Psychiatric/Behavioral: Positive for agitation, confusion and dysphoric mood. The patient is nervous/anxious.     There were no vitals taken for this visit.There is no height or weight on file to calculate BMI.  General Appearance: Casual  Eye Contact:   Good  Speech:  Garbled  Volume:  Increased  Mood:  Anxious, Dysphoric and Irritable  Affect:  Congruent  Thought Process:  Disorganized  Orientation:  Full (Time, Place, and Person)  Thought Content:  Illogical, Paranoid Ideation, Rumination and Tangential  Suicidal Thoughts:  No  Homicidal Thoughts:  No  Memory:  Immediate;   Fair Recent;   Fair Remote;   Fair  Judgement:  Impaired  Insight:  Shallow  Psychomotor Activity:  Restlessness and TD  Concentration:  Concentration: Fair  Recall:  [employer] of Knowledge:  Fair  Language:  Fair  Akathisia:  No  Handed:  Right  AIMS (if indicated):     Assets:  Desire for Improvement  ADL's:  Impaired  Cognition:  Impaired,  Mild  Sleep:  Number of Hours: 4.3    Treatment Plan Summary: Daily contact with patient to assess and evaluate symptoms and progress in treatment, Medication management and Plan This is a 64 year old man with longstanding chronic mood and behavior problems.  Has a diagnosis of schizoaffective disorder.  To my assessment the patient has always seemed to be more likely to be autistic and have chronic developmental disability and intellectual disability problems.  He does however certainly have severe anxiety and agitation at times.  Currently he has no place to live and is unable to care for himself.  He has developed tardive dyskinesia from his medicines which are not doing a good enough job in helping him to stay calm.  He is going to be kept on 15-minute checks.  Engaged in individual and group therapy.  I am going to make some adjustments to his medicines now that he is in the hospital to try and improve his anxiety symptoms increasing the Zoloft switching the Ativan to Klonopin, discontinuing Latuda, we will monitor his blood sugar closely and address that with appropriate medicine.  Patient met with treatment team today and understands the need to work on discharge planning  Observation Level/Precautions:  15 minute  checks  Laboratory:  Chemistry Profile  Psychotherapy:    Medications:    Consultations:    Discharge Concerns:    Estimated LOS:  Other:     Physician Treatment Plan for Primary Diagnosis: Schizoaffective disorder, bipolar type ([place]) Long Term Goal(s): Improvement in symptoms so as ready for discharge  Short Term Goals: Ability to verbalize feelings will improve and Ability to demonstrate self-control will improve  Physician Treatment Plan for Secondary Diagnosis: Principal Problem:   Schizoaffective disorder, bipolar type ([place]) Active Problems:   Active autistic disorder   Diabetes ([place])   Hypertension   Prostate hypertrophy  Long Term Goal(s): Improvement in symptoms so as ready for discharge  Short Term Goals: Ability to maintain clinical measurements within normal limits will improve and Compliance with prescribed medications will improve  I certify that inpatient services furnished can reasonably be expected to improve the patient's condition.    Alethia Berthold, MD 8/12/20214:03 PM
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
## 2020-02-20 NOTE — [facility] Suicide Risk Assessment (Signed)
[facility] Admission Suicide Risk Assessment   Nursing information obtained from:  Patient Demographic factors:  Male, Caucasian, Unemployed Current Mental Status:  Suicidal ideation indicated by patient Loss Factors:  Decline in physical health, Decrease in vocational status Historical Factors:  Impulsivity Risk Reduction Factors:  Sense of responsibility to family, Religious beliefs about death, Positive therapeutic relationship  Total Time spent with patient: 1 hour Principal Problem: Schizoaffective disorder, bipolar type ([place] Diagnosis:  Principal Problem:   Schizoaffective disorder, bipolar type ([name]) Active Problems:   Active autistic disorder   Diabetes ([name])   Hypertension   Prostate hypertrophy  Subjective Data: Patient seen chart reviewed.  Patient very well-known to me from previous encounters.  64 year old man with schizoaffective disorder.  He presents agitated active jittery and anxious hyperverbal and very nervous.  Denies acute suicidal ideation.  Had not made any attempts to harm himself coming in immediately although in the recent past he has had some episodes of alluding to self-harm or running out into the street.  Continued Clinical Symptoms:  Alcohol Use Disorder Identification Test Final Score (AUDIT): 0 The "Alcohol Use Disorders Identification Test", Guidelines for Use in Primary Care, Second Edition.  World Pharmacologist [facility]). Score between 0-7:  no or low risk or alcohol related problems. Score between 8-15:  moderate risk of alcohol related problems. Score between 16-19:  high risk of alcohol related problems. Score 20 or above:  warrants further diagnostic evaluation for alcohol dependence and treatment.   CLINICAL FACTORS:   Severe Anxiety and/or Agitation Bipolar Disorder:   Mixed State Schizophrenia:   Paranoid or undifferentiated type   Musculoskeletal: Strength & Muscle Tone: within normal limits Gait & Station: normal Patient leans:  N/A  Psychiatric Specialty Exam: Physical Exam Constitutional:      Appearance: He is well-developed.  HENT:     Head: Normocephalic and atraumatic.  Eyes:     Conjunctiva/sclera: Conjunctivae normal.     Pupils: Pupils are equal, round, and reactive to light.  Cardiovascular:     Heart sounds: Normal heart sounds.  Pulmonary:     Effort: Pulmonary effort is normal.  Abdominal:     Palpations: Abdomen is soft.  Musculoskeletal:        General: Normal range of motion.     Cervical back: Normal range of motion.  Skin:    General: Skin is warm and dry.  Neurological:     Mental Status: He is alert.     Motor: Tremor present.     Comments: Patient has a pretty gross tremor throughout as well as some facial abnormal movements that I think probably suggest tardive dyskinesia  Psychiatric:        Attention and Perception: He is inattentive.        Mood and Affect: Mood is anxious.        Speech: Speech is rapid and pressured.        Behavior: Behavior is agitated. Behavior is not aggressive.        Thought Content: Thought content does not include homicidal or suicidal ideation.        Cognition and Memory: Cognition is impaired. Memory is impaired.        Judgment: Judgment is impulsive and inappropriate.     Review of Systems  Constitutional: Negative.   HENT: Negative.   Eyes: Negative.   Respiratory: Negative.   Cardiovascular: Negative.   Gastrointestinal: Negative.   Musculoskeletal: Negative.   Skin: Negative.   Neurological: Negative.  Psychiatric/Behavioral: Positive for agitation, behavioral problems, confusion and sleep disturbance. The patient is nervous/anxious and is hyperactive.     There were no vitals taken for this visit.There is no height or weight on file to calculate BMI.  General Appearance: Casual  Eye Contact:  Fair  Speech:  Garbled and Slow  Volume:  Increased  Mood:  Anxious and Dysphoric  Affect:  Constricted  Thought Process:  Disorganized   Orientation:  Full (Time, Place, and Person)  Thought Content:  Illogical, Rumination and Tangential  Suicidal Thoughts:  No  Homicidal Thoughts:  No  Memory:  Immediate;   Fair Recent;   Fair Remote;   Fair  Judgement:  Impaired  Insight:  Shallow  Psychomotor Activity:  Restlessness and TD  Concentration:  Concentration: Fair  Recall:  [employer] of Knowledge:  Fair  Language:  Fair  Akathisia:  No  Handed:  Right  AIMS (if indicated):     Assets:  Desire for Improvement Financial Resources/Insurance Resilience  ADL's:  Impaired  Cognition:  Impaired,  Mild  Sleep:  Number of Hours: 4.3      COGNITIVE FEATURES THAT CONTRIBUTE TO RISK:  Polarized thinking    SUICIDE RISK:   Minimal: No identifiable suicidal ideation.  Patients presenting with no risk factors but with morbid ruminations; may be classified as minimal risk based on the severity of the depressive symptoms  PLAN OF CARE: Continue 15-minute checks.  Adjust medication for best attempt at addressing current anxiety and mood instability symptoms.  Engage in individual and group therapy.  Work on finding safe discharge planning  I certify that inpatient services furnished can reasonably be expected to improve the patient's condition.   Alethia Berthold, MD 02/20/2020, 3:58 PM

## 2020-02-20 NOTE — Plan of Care (Signed)
  Problem: Education: Goal: Knowledge of [facility] Education information/materials will improve Outcome: Not Progressing Goal: Emotional status will improve Outcome: Not Progressing Goal: Mental status will improve Outcome: Not Progressing Goal: Verbalization of understanding the information provided will improve Outcome: Not Progressing   Problem: Activity: Goal: Interest or engagement in activities will improve Outcome: Not Progressing Goal: Sleeping patterns will improve Outcome: Not Progressing   Problem: Coping: Goal: Ability to verbalize frustrations and anger appropriately will improve Outcome: Not Progressing Goal: Ability to demonstrate self-control will improve Outcome: Not Progressing   Problem: Health Behavior/Discharge Planning: Goal: Identification of resources available to assist in meeting health care needs will improve Outcome: Not Progressing Goal: Compliance with treatment plan for underlying cause of condition will improve Outcome: Not Progressing   Problem: Physical Regulation: Goal: Ability to maintain clinical measurements within normal limits will improve Outcome: Not Progressing   Problem: Safety: Goal: Periods of time without injury will increase Outcome: Not Progressing   Problem: Education: Goal: Ability to make informed decisions regarding treatment will improve Outcome: Not Progressing   Problem: Coping: Goal: Coping ability will improve Outcome: Not Progressing   Problem: Health Behavior/Discharge Planning: Goal: Identification of resources available to assist in meeting health care needs will improve Outcome: Not Progressing   Problem: Medication: Goal: Compliance with prescribed medication regimen will improve Outcome: Not Progressing   Problem: Self-Concept: Goal: Ability to disclose and discuss suicidal ideas will improve Outcome: Not Progressing Goal: Will verbalize positive feelings about self Outcome: Not  Progressing

## 2020-02-20 NOTE — Progress Notes (Signed)
Admission Note:  64 yr male who presents IVC on the unit, he appears anxious, restless and in emotional distress, he was shaking, crying and was endorsing hopelessness and failure, staff offered him emotional support and encouragement, but he was inconsolable, he was having thought blocking , he was receptive to staff but he ruminating on issues, very intrusive, his speech is pressured, he is  loud and hypervigilant, his thoughts are disorganized and incoherent at times  he appears depressed and sad.   Patient was not calm during the admission process, writer had to abruptly stop the interview.  Patient denies SI/HI/AVH but appears responding to internal stimuli.  Patient has Past medical Hx of Schizoaffective  Disorder, HTN,  Enlarged Prostrate, Active Autistic  Disorder,and DM without complication . Patient's skin was assessed and found to be warm, intact and dry, he was searched and no contraband found, POC and unit policies explained, understanding verbalized and consents obtained. 15 minutes safety checks maintained will continue to monitor.

## 2020-02-20 NOTE — BHH Counselor (Signed)
Adult Comprehensive Assessment  Patient ID: Alexander Beard, male   DOB: 08/28/1955, 64 y.o.   MRN: [id number]  Information Source: Information source: Patient  Current Stressors:  Patient states their primary concerns and needs for treatment are:: "trying to find a place for me to live" Patient states their goals for this hospitilization and ongoing recovery are:: "somewhere to live" Educational / Learning stressors: Pt denies. Employment / Job issues: Pt denies. Family Relationships: Pt denies. Financial / Lack of resources (include bankruptcy): Pt denies. Housing / Lack of housing: Pt denies. Physical health (include injuries & life threatening diseases): Pt denies. Social relationships: Pt denies. Substance abuse: Pt denies. Bereavement / Loss: Pt denies.  Living/Environment/Situation:  Living Arrangements: Group Home Living conditions (as described by patient or guardian): Pt reports that he has to leave his group home and can not return to his home. Who else lives in the home?: Other group members  Family History:  Marital status: Single Does patient have children?: No  Childhood History:  By whom was/is the patient raised?: Both parents, Sibling Description of patient's relationship with caregiver when they were a child: Pt reports that he was brought up by his parents and brothers. Patient's description of current relationship with people who raised him/her: Pt reports that parents are deceased, he describes the relationship with his brothers as "good and bad". How were you disciplined when you got in trouble as a child/adolescent?: "I was whipped" Does patient have siblings?: Yes Number of Siblings: 2 Description of patient's current relationship with siblings: He describes the relationship with his brothers as "good and bad". Did patient suffer any verbal/emotional/physical/sexual abuse as a child?: No Did patient suffer from severe childhood neglect?: No Has patient  ever been sexually abused/assaulted/raped as an adolescent or adult?: No Was the patient ever a victim of a crime or a disaster?: No Witnessed domestic violence?: No Has patient been affected by domestic violence as an adult?: No  Education:  Highest grade of school patient has completed: "a low grade" "I got my GED" Currently a student?: No Learning disability?: No  Employment/Work Situation:   Employment situation: On disability Why is patient on disability: Pt reports that he isn't sure. How long has patient been on disability: Pt reports that he is not sure. Has patient ever been in the [employer]?: No  Financial Resources:   Financial resources: Teacher, early years/pre, Medicare Does patient have a Programmer, applications or guardian?: Yes Name of representative payee or guardian: Unsure, chart indicates that brother is the patient's guardian, patient was unsure, calls to brother have not been succesful  Alcohol/Substance Abuse:   What has been your use of drugs/alcohol within the last 12 months?: Pt denies. If attempted suicide, did drugs/alcohol play a role in this?: No Alcohol/Substance Abuse Treatment Hx: Denies past history Has alcohol/substance abuse ever caused legal problems?: No  Social Support System:   Patient's Community Support System: Fair Astronomer System: "[facility]" Type of faith/religion: Passenger transport manager" How does patient's faith help to cope with current illness?: "prayer"  Leisure/Recreation:   Do You Have Hobbies?: Yes Leisure and Hobbies: "I love drawing,art, colouring, puzzles word a searches and dinasours"  Strengths/Needs:   What is the patient's perception of their strengths?: "Art some" Patient states they can use these personal strengths during their treatment to contribute to their recovery: Pt denies. Patient states these barriers may affect/interfere with their treatment: Pt denies. Patient states these barriers may affect their return to the  community: Pt denies.  Discharge Plan:   Currently receiving community mental health services: Yes (From Whom) ([facility]) Patient states concerns and preferences for aftercare planning are: Unable to assess at this time. Patient states they will know when they are safe and ready for discharge when: "when I can cook and live in society" Does patient have access to transportation?: Yes Does patient have financial barriers related to discharge medications?: No  Summary/Recommendations:   Summary and Recommendations (to be completed by the evaluator): [facility] 64 [facility] [place]  He presents to the hospital following increased anxiety after being asked to not return to his group home.  His previous group home placement disrupted due to the patient having wandering behaviors.  Patient reports that he is at the hospital for assistance for placement.  His primary diagnosis is Schizoaffective Disorder.   Recommendations include: crisis stabilization, therapeutic milieu, encourage group attendance and participation, medication management for detox/mood stabilization and development of comprehensive mental wellness/sobriety plan.  [name]. 02/20/2020

## 2020-02-20 NOTE — Progress Notes (Signed)
D- Patient alert and oriented. Affect/mood is anxious and worried. Denies SI, HI, AVH, and pain. Pt has been withdrawn to himself for the most part but did attend a group and has been in the day room often. He has been incontinent of urine on himself twice and took and shower and cleaned up.   A- Scheduled medications administered to patient, per MD orders. Support and encouragement provided.  Routine safety checks conducted every 15 minutes.  Patient informed to notify staff with problems or concerns.  R- No adverse drug reactions noted. Patient contracts for safety at this time. Patient compliant with medications and treatment plan. Patient receptive, calm, and cooperative. Patient interacts well with others on the unit.  Patient remains safe at this time.  Pt seems to have become calmer and resting after meds.   Water engineer

## 2020-02-20 NOTE — Tx Team (Signed)
Initial Treatment Plan 02/20/2020 4:54 AM Linna Darner GLO:[id number]    PATIENT STRESSORS: Health problems Marital or family conflict Medication change or noncompliance Traumatic event   PATIENT STRENGTHS: Motivation for treatment/growth Religious Affiliation   PATIENT IDENTIFIED PROBLEMS: Anxiety     Autistic disorder     Schizophrenia              DISCHARGE CRITERIA:  Adequate post-discharge living arrangements Improved stabilization in mood, thinking, and/or behavior Motivation to continue treatment in a less acute level of care  PRELIMINARY DISCHARGE PLAN: Outpatient therapy  PATIENT/FAMILY INVOLVEMENT: This treatment plan has been presented to and reviewed with the patient, Alexander Beard, The patient and family have been given the opportunity to ask questions and make suggestions.  Harl Bowie, RN 02/20/2020, 4:54 AM

## 2020-02-20 NOTE — BHH Suicide Risk Assessment (Signed)
[facility] INPATIENT:  Family/Significant Other Suicide Prevention Education  Suicide Prevention Education:  Contact Attempts: Hermon Zea, [phone], has been identified by the patient as the family member/significant other with whom the patient will be residing, and identified as the person(s) who will aid the patient in the event of a mental health crisis.  With written consent from the patient, two attempts were made to provide suicide prevention education, prior to and/or following the patient's discharge.  We were unsuccessful in providing suicide prevention education.  A suicide education pamphlet was given to the patient to share with family/significant other.  Date and time of first attempt: 02/20/2020 at 3:17PM Date and time of second attempt: Second attempt is needed.  CSW left HIPAA compliant voicemail.  [name] 02/20/2020, 3:16 PM

## 2020-02-20 NOTE — Plan of Care (Signed)
Pt complains of depression and anxiety. Pt denies SI, HI and AVH. Pt was educated on care plan and verbalizes understanding. Pt was encouraged to attend groups.  Collier Bullock RN Problem: Education: Goal: Knowledge of [facility] Education information/materials will improve Outcome: Not Progressing Goal: Emotional status will improve Outcome: Not Progressing Goal: Mental status will improve Outcome: Not Progressing Goal: Verbalization of understanding the information provided will improve Outcome: Not Progressing   Problem: Activity: Goal: Interest or engagement in activities will improve Outcome: Not Progressing Goal: Sleeping patterns will improve Outcome: Not Progressing   Problem: Coping: Goal: Ability to verbalize frustrations and anger appropriately will improve Outcome: Not Progressing Goal: Ability to demonstrate self-control will improve Outcome: Not Progressing   Problem: Health Behavior/Discharge Planning: Goal: Identification of resources available to assist in meeting health care needs will improve Outcome: Not Progressing Goal: Compliance with treatment plan for underlying cause of condition will improve Outcome: Not Progressing   Problem: Physical Regulation: Goal: Ability to maintain clinical measurements within normal limits will improve Outcome: Not Progressing   Problem: Safety: Goal: Periods of time without injury will increase Outcome: Not Progressing   Problem: Education: Goal: Ability to make informed decisions regarding treatment will improve Outcome: Not Progressing   Problem: Coping: Goal: Coping ability will improve Outcome: Not Progressing   Problem: Health Behavior/Discharge Planning: Goal: Identification of resources available to assist in meeting health care needs will improve Outcome: Not Progressing   Problem: Medication: Goal: Compliance with prescribed medication regimen will improve Outcome: Not Progressing   Problem:  Self-Concept: Goal: Ability to disclose and discuss suicidal ideas will improve Outcome: Not Progressing Goal: Will verbalize positive feelings about self Outcome: Not Progressing

## 2020-02-21 LAB — GLUCOSE, CAPILLARY
Glucose-Capillary: 129 mg/dL — ABNORMAL HIGH (ref 70–99)
Glucose-Capillary: 90 mg/dL (ref 70–99)
Glucose-Capillary: 180 mg/dL — ABNORMAL HIGH (ref 70–99)
Glucose-Capillary: 113 mg/dL — ABNORMAL HIGH (ref 70–99)

## 2020-02-21 MED ORDER — QUETIAPINE FUMARATE 100 MG PO TABS
100.0000 mg | ORAL_TABLET | Freq: Three times a day (TID) | ORAL | Status: DC
  Administered 2020-02-21 – 2020-02-22 (×4): 100 mg via ORAL
  Filled 2020-02-21 (×4): qty 1

## 2020-02-21 MED ORDER — CLONAZEPAM 1 MG PO TABS
1.0000 mg | ORAL_TABLET | Freq: Three times a day (TID) | ORAL | Status: DC
  Administered 2020-02-21 – 2020-02-22 (×3): 1 mg via ORAL
  Filled 2020-02-21 (×3): qty 1

## 2020-02-21 MED ORDER — LORAZEPAM 1 MG PO TABS
1.0000 mg | ORAL_TABLET | ORAL | Status: AC
  Administered 2020-02-21: 1 mg via ORAL
  Filled 2020-02-21: qty 1

## 2020-02-21 NOTE — Progress Notes (Signed)
Recreation Therapy Notes  Music Group Date: 02/21/2020   Time: 2:20pm    Location: Craft room     Behavioral response: Appropriate   Kandas Oliveto LRT/CTRS        Jomes Giraldo 02/21/2020 3:35 PM

## 2020-02-21 NOTE — Plan of Care (Signed)
Pt complains of depression, anxiety, SI, HI and AVH. Pt was educated on care plan and verbalizes understanding. Pt was encouraged to attend groups. Collier Bullock RN Problem: Education: Goal: Knowledge of [facility] Education information/materials will improve Outcome: Progressing Goal: Emotional status will improve Outcome: Progressing Goal: Mental status will improve Outcome: Progressing Goal: Verbalization of understanding the information provided will improve Outcome: Progressing   Problem: Activity: Goal: Interest or engagement in activities will improve Outcome: Progressing Goal: Sleeping patterns will improve Outcome: Progressing   Problem: Coping: Goal: Ability to verbalize frustrations and anger appropriately will improve Outcome: Progressing Goal: Ability to demonstrate self-control will improve Outcome: Progressing   Problem: Health Behavior/Discharge Planning: Goal: Identification of resources available to assist in meeting health care needs will improve Outcome: Progressing Goal: Compliance with treatment plan for underlying cause of condition will improve Outcome: Progressing   Problem: Physical Regulation: Goal: Ability to maintain clinical measurements within normal limits will improve Outcome: Progressing   Problem: Safety: Goal: Periods of time without injury will increase Outcome: Progressing   Problem: Education: Goal: Ability to make informed decisions regarding treatment will improve Outcome: Progressing   Problem: Coping: Goal: Coping ability will improve Outcome: Progressing   Problem: Health Behavior/Discharge Planning: Goal: Identification of resources available to assist in meeting health care needs will improve Outcome: Progressing   Problem: Medication: Goal: Compliance with prescribed medication regimen will improve Outcome: Progressing   Problem: Self-Concept: Goal: Ability to disclose and discuss suicidal ideas will  improve Outcome: Progressing Goal: Will verbalize positive feelings about self Outcome: Progressing

## 2020-02-21 NOTE — Progress Notes (Addendum)
D- Patient alert and oriented. Affect/mood is worried and wide eyed . Pt denies SI, HI, AVH, and pain. Pt did attend a group. Pt has had some times of panic and being overly anxious. Meds helped along with education on calming techniques.   A- Scheduled medications administered to patient, per MD orders. Support and encouragement provided.  Routine safety checks conducted every 15 minutes.  Patient informed to notify staff with problems or concerns.  R- No adverse drug reactions noted. Patient contracts for safety at this time. Patient compliant with medications and treatment plan. Patient receptive, calm, and cooperative. Patient interacts well with others on the unit.  Patient remains safe at this time.  Collier Bullock RN

## 2020-02-21 NOTE — BHH Suicide Risk Assessment (Signed)
[facility] INPATIENT:  Family/Significant Other Suicide Prevention Education  Suicide Prevention Education:  Education Completed; Alexander Beard, brother, [phone], has been identified by the patient as the family member/significant other with whom the patient will be residing, and identified as the person(s) who will aid the patient in the event of a mental health crisis (suicidal ideations/suicide attempt).  With written consent from the patient, the family member/significant other has been provided the following suicide prevention education, prior to the and/or following the discharge of the patient.  The suicide prevention education provided includes the following:  Suicide risk factors  Suicide prevention and interventions  National Suicide Hotline telephone number  [facility] assessment telephone number  [facility] Emergency Assistance [place] and/or Residential Mobile Crisis Unit telephone number  Request made of family/significant other to:  Remove weapons (e.g., guns, rifles, knives), all items previously/currently identified as safety concern.    Remove drugs/medications (over-the-counter, prescriptions, illicit drugs), all items previously/currently identified as a safety concern.  The family member/significant other verbalizes understanding of the suicide prevention education information provided.  The family member/significant other agrees to remove the items of safety concern listed above.  Brother reports that patient became agitated and displaced from his group home.  He reports that "He very well could.  I have seen him run into the middle of traffic."   Brother denies that pt has access to weapons.  Brother reports that patient has confirmed that he can not return to group home.  He reports that the patient can not stay with him.  He reports that he is working with an agency in [place] to get housing for the patient.    Alexander Beard 02/21/2020, 3:20 PM

## 2020-02-21 NOTE — Progress Notes (Signed)
Recreation Therapy Notes  Date: 02/21/2020  Time: 9:30 am   Location: Craft room     Behavioral response: N/A   Intervention Topic: Happiness    Discussion/Intervention: Patient did not attend group.   Clinical Observations/Feedback:  Patient did not attend group.   Selda Jalbert LRT/CTRS        Eain Mullendore 02/21/2020 11:22 AM

## 2020-02-21 NOTE — Progress Notes (Signed)
[facility] MD Progress Note  02/21/2020 3:33 PM Alexander Beard  MRN:  [id number] Subjective: Follow-up for this patient with schizoaffective disorder.  Patient today was quite agitated in the morning.  Expressed confusion and agitation.  Thought that people were calling the police on him.  He was trembling and having a hard time with self-care.  Did not get aggressive with anyone but needed a lot of one-on-one contact Principal Problem: Schizoaffective disorder, bipolar type ([place]) Diagnosis: Principal Problem:   Schizoaffective disorder, bipolar type ([place]) Active Problems:   Active autistic disorder   Diabetes ([employer])   Hypertension   Prostate hypertrophy  Total Time spent with patient: 30 minutes  Past Psychiatric History: Past history of longstanding behavioral problems related to mental illness  Past Medical History:  Past Medical History:  Diagnosis Date  . Anxiety   . Anxiety disorder due to known physiological condition    HOSPITALIZED 10/18  . Arthritis   . Autistic disorder, residual state   . COPD (chronic obstructive pulmonary disease) ([place])   . Depression   . Developmental disorder   . Diabetes mellitus without complication ([place])   . Dyslipidemia   . Esophageal reflux   . HOH (hard of hearing)    MILDLY  . Hypertension   . Obesity   . Overactive bladder   . Palpitations    ANXIETY  . Schizophrenia, schizoaffective ([place])   . Sleep apnea   . Tremors of nervous system    HANDS DUE TO MEDICATIONS  . Urinary incontinence     Past Surgical History:  Procedure Laterality Date  . CATARACT EXTRACTION W/PHACO Left 11/14/2017   Procedure: CATARACT EXTRACTION PHACO AND INTRAOCULAR LENS PLACEMENT (IOC);  Surgeon: Birder Robson, MD;  Location: [facility] ORS;  Service: Ophthalmology;  Laterality: Left;  [place] 00:42 AP% 14.6 CDE 6.12 Fluid pack lot # [id number]  . COLONOSCOPY  01/28/2005  . COLONOSCOPY WITH PROPOFOL N/A 05/09/2016   Procedure: COLONOSCOPY WITH PROPOFOL;  Surgeon:  Manya Silvas, MD;  Location: [facility] ENDOSCOPY;  Service: Endoscopy;  Laterality: N/A;  . ESOPHAGOGASTRODUODENOSCOPY N/A 05/09/2016   Procedure: ESOPHAGOGASTRODUODENOSCOPY (EGD);  Surgeon: Manya Silvas, MD;  Location: [facility] ENDOSCOPY;  Service: Endoscopy;  Laterality: N/A;  . FRACTURE SURGERY     ORIF SHOULDER  . TONSILLECTOMY     Family History:  Family History  Problem Relation Age of Onset  . Hypertension Mother   . Stroke Father   . Heart Problems Father    Family Psychiatric  History: See previous Social History:  Social History   Substance and Sexual Activity  Alcohol Use No  . Alcohol/week: 0.0 standard drinks     Social History   Substance and Sexual Activity  Drug Use No    Social History   Socioeconomic History  . Marital status: Single    Spouse name: Not on file  . Number of children: Not on file  . Years of education: Not on file  . Highest education level: Not on file  Occupational History  . Not on file  Tobacco Use  . Smoking status: Never Smoker  . Smokeless tobacco: Never Used  Vaping Use  . Vaping Use: Never used  Substance and Sexual Activity  . Alcohol use: No    Alcohol/week: 0.0 standard drinks  . Drug use: No  . Sexual activity: Never  Other Topics Concern  . Not on file  Social History Narrative   The patient never finished high school but did get his GED. He  works in the past as a Museum/gallery conservator. He has never been married and has no children. He is currently in disability and [name] is his legal guardian. He has been living in group homes for many years.      No pending legal charges   Social Determinants of Health   Financial Resource Strain:   . Difficulty of Paying Living Expenses:   Food Insecurity:   . Worried About Charity fundraiser in the Last Year:   . Arboriculturist in the Last Year:   Transportation Needs:   . Film/video editor (Medical):   Marland Kitchen Lack of Transportation (Non-Medical):    Physical Activity:   . Days of Exercise per Week:   . Minutes of Exercise per Session:   Stress:   . Feeling of Stress :   Social Connections:   . Frequency of Communication with Friends and Family:   . Frequency of Social Gatherings with Friends and Family:   . Attends Religious Services:   . Active Member of Clubs or Organizations:   . Attends Archivist Meetings:   Marland Kitchen Marital Status:    Additional Social History:                         Sleep: Fair  Appetite:  Fair  Current Medications: Current Facility-Administered Medications  Medication Dose Route Frequency Provider Last Rate Last Admin  . acetaminophen (TYLENOL) tablet 650 mg  650 mg Oral Q6H PRN [name], MD      . alum & mag hydroxide-simeth (MAALOX/MYLANTA) 200-200-20 MG/5ML suspension 30 mL  30 mL Oral Q4H PRN [name], MD      . aspirin EC tablet 81 mg  81 mg Oral Daily Reeves Musick, [name], MD   81 mg at 02/21/20 0745  . clonazePAM (KLONOPIN) tablet 1 mg  1 mg Oral TID Aide Wojnar T, MD      . docusate sodium (COLACE) capsule 100 mg  100 mg Oral BID Jossue Rubenstein, [name], MD   100 mg at 02/21/20 0745  . gabapentin (NEURONTIN) capsule 100 mg  100 mg Oral TID Princella Jaskiewicz T, MD   100 mg at 02/21/20 1150  . hydrOXYzine (ATARAX/VISTARIL) tablet 10 mg  10 mg Oral TID PRN [name], MD   10 mg at 02/20/20 2127  . insulin aspart (novoLOG) injection 0-5 Units  0-5 Units Subcutaneous QHS Caelynn Marshman T, MD      . insulin aspart (novoLOG) injection 0-9 Units  0-9 Units Subcutaneous TID WC Odell Choung, [name], MD   1 Units at 02/21/20 0746  . lamoTRIgine (LAMICTAL) tablet 100 mg  100 mg Oral QHS Jaylise Peek T, MD   100 mg at 02/20/20 2126  . linagliptin (TRADJENTA) tablet 5 mg  5 mg Oral Daily Steadman Prosperi, [name], MD   5 mg at 02/21/20 0745  . loratadine (CLARITIN) tablet 10 mg  10 mg Oral Daily Ciaran Begay, [name], MD   10 mg at 02/21/20 0745  . magnesium hydroxide (MILK OF MAGNESIA) suspension 30 mL   30 mL Oral Daily PRN [name], MD      . metFORMIN (GLUCOPHAGE) tablet 500 mg  500 mg Oral BID WC Leontae Bostock, [name], MD   500 mg at 02/21/20 0745  . metoprolol succinate (TOPROL-XL) 24 hr tablet 50 mg  50 mg Oral Daily Tab Rylee, [name], MD   50 mg at 02/21/20 0753  .  oxybutynin (DITROPAN) tablet 5 mg  5 mg Oral QAC breakfast Kiaya Haliburton, [name], MD   5 mg at 02/21/20 0757  . pantoprazole (PROTONIX) EC tablet 40 mg  40 mg Oral Daily Anny Sayler, [name], MD   40 mg at 02/21/20 0746  . QUEtiapine (SEROQUEL) tablet 100 mg  100 mg Oral TID Marica Trentham, [name], MD   100 mg at 02/21/20 1303  . QUEtiapine (SEROQUEL) tablet 400 mg  400 mg Oral QHS [name], NP   400 mg at 02/20/20 2126  . sertraline (ZOLOFT) tablet 100 mg  100 mg Oral BID Mariann Palo, [name], MD   100 mg at 02/21/20 0746  . simvastatin (ZOCOR) tablet 20 mg  20 mg Oral q1800 Sadat Sliwa, [name], MD   20 mg at 02/20/20 1722    Lab Results:  Results for orders placed or performed during the hospital encounter of 02/19/20 (from the past 48 hour(s))  TSH     Status: None   Collection Time: 02/20/20  8:49 AM  Result Value Ref Range   TSH 2.866 0.350 - 4.500 uIU/mL    Comment: Performed by a 3rd Generation assay with a functional sensitivity of <=0.01 uIU/mL. Performed at [facility], [address] [place], [place] [address]   Hemoglobin A1c     Status: Abnormal   Collection Time: 02/20/20  4:10 PM  Result Value Ref Range   Hgb A1c MFr Bld 6.6 (H) 4.8 - 5.6 %    Comment: (NOTE) Pre diabetes:          5.7%-6.4%  Diabetes:              >6.4%  Glycemic control for   <7.0% adults with diabetes    Mean Plasma Glucose 142.72 mg/dL    Comment: Performed at [facility] [address], [place],  [address]  Glucose, capillary     Status: Abnormal   Collection Time: 02/20/20  5:01 PM  Result Value Ref Range   Glucose-Capillary 193 (H) 70 - 99 mg/dL    Comment: Glucose reference range applies only to samples taken  after fasting for at least 8 hours.  Glucose, capillary     Status: None   Collection Time: 02/20/20  8:37 PM  Result Value Ref Range   Glucose-Capillary 79 70 - 99 mg/dL    Comment: Glucose reference range applies only to samples taken after fasting for at least 8 hours.   Comment 1 Notify RN   Glucose, capillary     Status: Abnormal   Collection Time: 02/21/20  7:40 AM  Result Value Ref Range   Glucose-Capillary 129 (H) 70 - 99 mg/dL    Comment: Glucose reference range applies only to samples taken after fasting for at least 8 hours.  Glucose, capillary     Status: None   Collection Time: 02/21/20 11:43 AM  Result Value Ref Range   Glucose-Capillary 90 70 - 99 mg/dL    Comment: Glucose reference range applies only to samples taken after fasting for at least 8 hours.   Comment 1 Notify RN     Blood Alcohol level:  Lab Results  Component Value Date   ETH <10 01/22/2020   ETH <10 78/46/9629    Metabolic Disorder Labs: Lab Results  Component Value Date   HGBA1C 6.6 (H) 02/20/2020   MPG 142.72 02/20/2020   No results found for: PROLACTIN Lab Results  Component Value Date   CHOL 153 04/10/2015   TRIG  166 (H) 04/10/2015   HDL 50 04/10/2015   CHOLHDL 3.1 04/10/2015   VLDL 33 04/10/2015   LDLCALC 70 04/10/2015    Physical Findings: AIMS:  , ,  ,  ,    CIWA:    COWS:     Musculoskeletal: Strength & Muscle Tone: within normal limits Gait & Station: normal Patient leans: N/A  Psychiatric Specialty Exam: Physical Exam Vitals and nursing note reviewed.  Constitutional:      Appearance: He is well-developed.  HENT:     Head: Normocephalic and atraumatic.  Eyes:     Conjunctiva/sclera: Conjunctivae normal.     Pupils: Pupils are equal, round, and reactive to light.  Cardiovascular:     Heart sounds: Normal heart sounds.  Pulmonary:     Effort: Pulmonary effort is normal.  Abdominal:     Palpations: Abdomen is soft.  Musculoskeletal:        General: Normal  range of motion.     Cervical back: Normal range of motion.  Skin:    General: Skin is warm and dry.  Neurological:     Mental Status: He is alert.     Motor: Tremor present.  Psychiatric:        Attention and Perception: He is inattentive.        Mood and Affect: Mood is anxious. Affect is labile and tearful.        Speech: Speech is rapid and pressured.        Behavior: Behavior is agitated. Behavior is not aggressive.        Thought Content: Thought content is paranoid. Thought content does not include homicidal or suicidal ideation.        Cognition and Memory: Cognition is impaired. Memory is impaired.        Judgment: Judgment is impulsive and inappropriate.     Review of Systems  Constitutional: Negative.   HENT: Negative.   Eyes: Negative.   Respiratory: Negative.   Cardiovascular: Negative.   Gastrointestinal: Negative.   Musculoskeletal: Negative.   Skin: Negative.   Neurological: Negative.   Psychiatric/Behavioral: Positive for behavioral problems and confusion.    Blood pressure (!) 124/108, pulse 92, temperature 99 F (37.2 C), temperature source Oral.There is no height or weight on file to calculate BMI.  General Appearance: Disheveled  Eye Contact:  Minimal  Speech:  Garbled and Slow  Volume:  Decreased  Mood:  Dysphoric  Affect:  Congruent  Thought Process:  Disorganized  Orientation:  Negative  Thought Content:  Illogical  Suicidal Thoughts:  No  Homicidal Thoughts:  No  Memory:  Immediate;   Fair Recent;   Poor Remote;   Poor  Judgement:  Impaired  Insight:  Shallow  Psychomotor Activity:  Restlessness  Concentration:  Concentration: Poor  Recall:  Poor  Fund of Knowledge:  Poor  Language:  Fair  Akathisia:  No  Handed:  Right  AIMS (if indicated):     Assets:  Desire for Improvement Social Support  ADL's:  Impaired  Cognition:  Impaired,  Mild  Sleep:  Number of Hours: 4.3     Treatment Plan Summary: Daily contact with patient to  assess and evaluate symptoms and progress in treatment, Medication management and Plan Continue to adjust patient's medicine.  Increase clonazepam dosage.  Add Seroquel during the daytime.  One-on-one contact and reassessment daily.  I will try to reach out to his brother this afternoon as well.  Alethia Berthold, MD 02/21/2020, 3:33 PM

## 2020-02-21 NOTE — Progress Notes (Signed)
Recreation Therapy Notes  INPATIENT RECREATION THERAPY ASSESSMENT  Patient Details Name: MAXAMILIAN AMADON MRN: [id number] DOB: [birth year] Today's Date: 02/21/2020       Information Obtained From: Patient  Able to Participate in Assessment/Interview: Yes  Patient Presentation: Responsive (Tearful)  Reason for Admission (Per Patient): Active Symptoms  Patient Stressors:    Coping Skills:   Talk, Art  Leisure Interests (2+):  Art - Draw, Art - Coloring, Games - Jig-saw puzzles, Games - Word-search  Frequency of Recreation/Participation:    Awareness of Community Resources:     [employer]:     Current Use:    If no, Barriers?:    Expressed Interest in [place] of Residence:  Insurance underwriter  Patient Main Form of Transportation: Musician  Patient Strengths:  Art  Patient Identified Areas of Improvement:  Having some where to live  Patient Goal for Hospitalization:  Find a place to live  Current SI (including self-harm):  No  Current HI:  No  Current AVH: No  Staff Intervention Plan: Group Attendance, Collaborate with Interdisciplinary Treatment Team  Consent to Intern Participation: N/A  Rashanna Christiana 02/21/2020, 2:41 PM

## 2020-02-21 NOTE — Plan of Care (Signed)
Patient appropriate on the unit this evening.   Problem: Education: Goal: Emotional status will improve Outcome: Progressing Goal: Mental status will improve Outcome: Progressing

## 2020-02-21 NOTE — Progress Notes (Signed)
Pt was incontinent of urine. Collier Bullock RN

## 2020-02-21 NOTE — Progress Notes (Signed)
Patient remains sad and labile. He is voicing auditory hallucinations of the devil speaking to him telling him to think bad things.  Patient contracts for safety at this time. Patient compliant with medications and treatment plan. Patient appears to be resting in bed at this time.

## 2020-02-22 ENCOUNTER — Other Ambulatory Visit: Payer: Self-pay | Admitting: Psychiatry

## 2020-02-22 LAB — GLUCOSE, CAPILLARY
Glucose-Capillary: 120 mg/dL — ABNORMAL HIGH (ref 70–99)
Glucose-Capillary: 130 mg/dL — ABNORMAL HIGH (ref 70–99)
Glucose-Capillary: 112 mg/dL — ABNORMAL HIGH (ref 70–99)
Glucose-Capillary: 116 mg/dL — ABNORMAL HIGH (ref 70–99)

## 2020-02-22 MED ORDER — CLONAZEPAM 1 MG PO TABS
1.0000 mg | ORAL_TABLET | Freq: Four times a day (QID) | ORAL | Status: DC
  Administered 2020-02-22 – 2020-06-08 (×421): 1 mg via ORAL
  Filled 2020-02-22 (×424): qty 1

## 2020-02-22 MED ORDER — QUETIAPINE FUMARATE 100 MG PO TABS
100.0000 mg | ORAL_TABLET | Freq: Four times a day (QID) | ORAL | Status: DC
  Administered 2020-02-22 – 2020-06-08 (×421): 100 mg via ORAL
  Filled 2020-02-22 (×423): qty 1

## 2020-02-22 MED ORDER — OXYBUTYNIN CHLORIDE 5 MG PO TABS
10.0000 mg | ORAL_TABLET | Freq: Two times a day (BID) | ORAL | Status: DC
  Administered 2020-02-22 – 2020-06-08 (×213): 10 mg via ORAL
  Filled 2020-02-22 (×219): qty 2

## 2020-02-22 NOTE — [facility] Group Notes (Signed)
[facility] Group Notes: (Clinical Social Work)   02/22/2020      Type of Therapy:  Group Therapy   Participation Level:  Did Not Attend - was invited individually by Nurse/MHT and chose not to attend.   [name], [place] 02/22/2020  2:24 PM

## 2020-02-22 NOTE — Progress Notes (Signed)
Pt is alert and oriented to person, place, time and situation. Pt is calm, cooperative, pleasant, blunted affect, makes poor eye contact, reports anxiety and panic attacks, but is unable to identify a trigger. Pt denies suicidal and homicidal ideation, denies hallucinations, reports depression, is forgetful, medication complaint, visible and out for meals, otherwise spends time resting in his bed in his room. No distress noted, none reported, pt voices no complaints. Will continue to monitor pt per Q15 minute face checks and monitor for safety and progress.

## 2020-02-22 NOTE — Progress Notes (Signed)
After responding to a Rapid Response notification about another patient, this Chaplain On-Call was asked by Staff to visit with this patient. Met with patient in a lounge area of the Behavioral Medicine Unit.  Patient stated that he is very anxious about continuous thoughts that cause him to have a negative world-view. He recalled many significant events from his past in which he received much recognition for his achievements. Specifically, he mentioned his advocacy in [place] for the local chapter of the [employer] for Mental [employer]ness, including speaking at several public events. He also recalled the pleasure he gained from singing in public with his brother, especially the [employer] at local baseball parks.  Now, however, he is struggling mightily to have positive thoughts and feelings about himself. He stated that his illnesses have kept him from participating in a faith community, and he expressed a longing for that in his life.  Chaplain provided much supportive listening, compassionate presence, and spiritual and emotional support. Patient stated that he felt calmer and less anxious after this conversation.  Later, this Chaplain spoke with RN Cleo, the patient's Nurse, and shared with her the key elements of this visit.  Downey Myrene Bougher M.Div., [facility]

## 2020-02-22 NOTE — Progress Notes (Signed)
Patient less agitated this evening compared to last night. Patient denies SI/HI/AVH and pain. Patient stated he feels the best tonight that he has in a long time. Patient compliant with medication administration per MD orders. Patient given education, support, and encouragement to be active in his treatment plan. Patient being monitored Q 15 minutes for safety per unit protocol. Patient remains safe on the unit.

## 2020-02-22 NOTE — Progress Notes (Signed)
[facility] MD Progress Note  02/22/2020 1:47 PM Alexander Beard  MRN:  [id number] Subjective: Follow-up for this 64 year old man with schizoaffective disorder.  Patient remains frequently anxious.  Tremoring much of the time.  Jittery.  Responds well however to medicine and is responding pretty well to redirection from staff.  Denies suicidal thoughts but is admitting that he feels confused and disorganized in his thinking.  Patient is having frequent incontinence of urine and his blood sugars are still slightly high although under better control. Principal Problem: Schizoaffective disorder, bipolar type ([place]) Diagnosis: Principal Problem:   Schizoaffective disorder, bipolar type ([place]) Active Problems:   Active autistic disorder   Diabetes ([place])   Hypertension   Prostate hypertrophy  Total Time spent with patient: 30 minutes  Past Psychiatric History: Past history of longstanding chronic disability with lots of behavior problems  Past Medical History:  Past Medical History:  Diagnosis Date  . Anxiety   . Anxiety disorder due to known physiological condition    HOSPITALIZED 10/18  . Arthritis   . Autistic disorder, residual state   . COPD (chronic obstructive pulmonary disease) ([place])   . Depression   . Developmental disorder   . Diabetes mellitus without complication ([place])   . Dyslipidemia   . Esophageal reflux   . HOH (hard of hearing)    MILDLY  . Hypertension   . Obesity   . Overactive bladder   . Palpitations    ANXIETY  . Schizophrenia, schizoaffective ([place])   . Sleep apnea   . Tremors of nervous system    HANDS DUE TO MEDICATIONS  . Urinary incontinence     Past Surgical History:  Procedure Laterality Date  . CATARACT EXTRACTION W/PHACO Left 11/14/2017   Procedure: CATARACT EXTRACTION PHACO AND INTRAOCULAR LENS PLACEMENT (IOC);  Surgeon: Birder Robson, MD;  Location: [facility] ORS;  Service: Ophthalmology;  Laterality: Left;  [place] 00:42 AP% 14.6 CDE 6.12 Fluid pack lot #  [id number]  . COLONOSCOPY  01/28/2005  . COLONOSCOPY WITH PROPOFOL N/A 05/09/2016   Procedure: COLONOSCOPY WITH PROPOFOL;  Surgeon: Manya Silvas, MD;  Location: [facility] ENDOSCOPY;  Service: Endoscopy;  Laterality: N/A;  . ESOPHAGOGASTRODUODENOSCOPY N/A 05/09/2016   Procedure: ESOPHAGOGASTRODUODENOSCOPY (EGD);  Surgeon: Manya Silvas, MD;  Location: [facility] ENDOSCOPY;  Service: Endoscopy;  Laterality: N/A;  . FRACTURE SURGERY     ORIF SHOULDER  . TONSILLECTOMY     Family History:  Family History  Problem Relation Age of Onset  . Hypertension Mother   . Stroke Father   . Heart Problems Father    Family Psychiatric  History: See previous Social History:  Social History   Substance and Sexual Activity  Alcohol Use No  . Alcohol/week: 0.0 standard drinks     Social History   Substance and Sexual Activity  Drug Use No    Social History   Socioeconomic History  . Marital status: Single    Spouse name: Not on file  . Number of children: Not on file  . Years of education: Not on file  . Highest education level: Not on file  Occupational History  . Not on file  Tobacco Use  . Smoking status: Never Smoker  . Smokeless tobacco: Never Used  Vaping Use  . Vaping Use: Never used  Substance and Sexual Activity  . Alcohol use: No    Alcohol/week: 0.0 standard drinks  . Drug use: No  . Sexual activity: Never  Other Topics Concern  . Not on file  Social History Narrative   The patient never finished high school but did get his GED. 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 He is currently in disability and [name] is his legal guardian. He has been living in group homes for many years.      No pending legal charges   Social Determinants of Health   Financial Resource Strain:   . Difficulty of Paying Living Expenses:   Food Insecurity:   . Worried About Charity fundraiser in the Last Year:   . Academic librarian in the Last Year:   Transportation Needs:   . Film/video editor (Medical):   Marland Kitchen Lack of Transportation (Non-Medical):   Physical Activity:   . Days of Exercise per Week:   . Minutes of Exercise per Session:   Stress:   . Feeling of Stress :   Social Connections:   . Frequency of Communication with Friends and Family:   . Frequency of Social Gatherings with Friends and Family:   . Attends Religious Services:   . Active Member of Clubs or Organizations:   . Attends Archivist Meetings:   Marland Kitchen Marital Status:    Additional Social History:                         Sleep: Fair  Appetite:  Fair  Current Medications: Current Facility-Administered Medications  Medication Dose Route Frequency Provider Last Rate Last Admin  . acetaminophen (TYLENOL) tablet 650 mg  650 mg Oral Q6H PRN [name], MD      . alum & mag hydroxide-simeth (MAALOX/MYLANTA) 200-200-20 MG/5ML suspension 30 mL  30 mL Oral Q4H PRN [name], MD      . aspirin EC tablet 81 mg  81 mg Oral Daily Ashrith Sagan, [name], MD   81 mg at 02/22/20 0846  . clonazePAM (KLONOPIN) tablet 1 mg  1 mg Oral TID Nivedita Mirabella, [name], MD   1 mg at 02/22/20 1223  . docusate sodium (COLACE) capsule 100 mg  100 mg Oral BID Roch Quach, [name], MD   100 mg at 02/22/20 0846  . gabapentin (NEURONTIN) capsule 100 mg  100 mg Oral TID Summer Mccolgan T, MD   100 mg at 02/22/20 1221  . hydrOXYzine (ATARAX/VISTARIL) tablet 10 mg  10 mg Oral TID PRN [name], MD   10 mg at 02/20/20 2127  . insulin aspart (novoLOG) injection 0-5 Units  0-5 Units Subcutaneous QHS Davien Malone T, MD      . insulin aspart (novoLOG) injection 0-9 Units  0-9 Units Subcutaneous TID WC Cald[place] Leap, [name], MD   2 Units at 02/21/20 1623  . lamoTRIgine (LAMICTAL) tablet 100 mg  100 mg Oral QHS Gerhart Ruggieri T, MD   100 mg at 02/21/20 2212  . linagliptin (TRADJENTA) tablet 5 mg  5 mg Oral Daily Shiniqua Groseclose, [name], MD   5 mg at 02/22/20 0846  .  loratadine (CLARITIN) tablet 10 mg  10 mg Oral Daily Dailah Opperman, [name], MD   10 mg at 02/22/20 0846  . magnesium hydroxide (MILK OF MAGNESIA) suspension 30 mL  30 mL Oral Daily PRN [name], MD      . metFORMIN (GLUCOPHAGE) tablet 500 mg  500 mg Oral BID WC Deandre Brannan, [name], MD   500 mg at 02/22/20 0847  . metoprolol succinate (TOPROL-XL) 24 hr tablet  50 mg  50 mg Oral Daily [name] Holster, [name], MD   50 mg at 02/22/20 0845  . oxybutynin (DITROPAN) tablet 5 mg  5 mg Oral QAC breakfast Argelio Granier, [name], MD   5 mg at 02/22/20 0849  . pantoprazole (PROTONIX) EC tablet 40 mg  40 mg Oral Daily Zaakirah Kistner, [name], MD   40 mg at 02/22/20 0847  . QUEtiapine (SEROQUEL) tablet 100 mg  100 mg Oral TID Saramarie Stinger, [name], MD   100 mg at 02/22/20 1220  . QUEtiapine (SEROQUEL) tablet 400 mg  400 mg Oral QHS [name], NP   400 mg at 02/21/20 2211  . sertraline (ZOLOFT) tablet 100 mg  100 mg Oral BID Calin Ellery, [name], MD   100 mg at 02/22/20 0847  . simvastatin (ZOCOR) tablet 20 mg  20 mg Oral q1800 Jay Kempe T, MD   20 mg at 02/21/20 1736    Lab Results:  Results for orders placed or performed during the hospital encounter of 02/19/20 (from the past 48 hour(s))  Hemoglobin A1c     Status: Abnormal   Collection Time: 02/20/20  4:10 PM  Result Value Ref Range   Hgb A1c MFr Bld 6.6 (H) 4.8 - 5.6 %    Comment: (NOTE) Pre diabetes:          5.7%-6.4%  Diabetes:              >6.4%  Glycemic control for   <7.0% adults with diabetes    Mean Plasma Glucose 142.72 mg/dL    Comment: Performed at [facility], [place] [address] [place], [place] [address]  Glucose, capillary     Status: Abnormal   Collection Time: 02/20/20  5:01 PM  Result Value Ref Range   Glucose-Capillary 193 (H) 70 - 99 mg/dL    Comment: Glucose reference range applies only to samples taken after fasting for at least 8 hours.  Glucose, capillary     Status: None   Collection Time: 02/20/20  8:37 PM  Result Value Ref Range    Glucose-Capillary 79 70 - 99 mg/dL    Comment: Glucose reference range applies only to samples taken after fasting for at least 8 hours.   Comment 1 Notify RN   Glucose, capillary     Status: Abnormal   Collection Time: 02/21/20  7:40 AM  Result Value Ref Range   Glucose-Capillary 129 (H) 70 - 99 mg/dL    Comment: Glucose reference range applies only to samples taken after fasting for at least 8 hours.  Glucose, capillary     Status: None   Collection Time: 02/21/20 11:43 AM  Result Value Ref Range   Glucose-Capillary 90 70 - 99 mg/dL    Comment: Glucose reference range applies only to samples taken after fasting for at least 8 hours.   Comment 1 Notify RN   Glucose, capillary     Status: Abnormal   Collection Time: 02/21/20  4:19 PM  Result Value Ref Range   Glucose-Capillary 180 (H) 70 - 99 mg/dL    Comment: Glucose reference range applies only to samples taken after fasting for at least 8 hours.  Glucose, capillary     Status: Abnormal   Collection Time: 02/21/20 10:10 PM  Result Value Ref Range   Glucose-Capillary 113 (H) 70 - 99 mg/dL    Comment: Glucose reference range applies only to samples taken after fasting for at least 8 hours.  Glucose, capillary  Status: Abnormal   Collection Time: 02/22/20  8:43 AM  Result Value Ref Range   Glucose-Capillary 120 (H) 70 - 99 mg/dL    Comment: Glucose reference range applies only to samples taken after fasting for at least 8 hours.  Glucose, capillary     Status: Abnormal   Collection Time: 02/22/20 11:43 AM  Result Value Ref Range   Glucose-Capillary 130 (H) 70 - 99 mg/dL    Comment: Glucose reference range applies only to samples taken after fasting for at least 8 hours.    Blood Alcohol level:  Lab Results  Component Value Date   ETH <10 01/22/2020   ETH <10 35/00/9381    Metabolic Disorder Labs: Lab Results  Component Value Date   HGBA1C 6.6 (H) 02/20/2020   MPG 142.72 02/20/2020   No results found for:  PROLACTIN Lab Results  Component Value Date   CHOL 153 04/10/2015   TRIG 166 (H) 04/10/2015   HDL 50 04/10/2015   CHOLHDL 3.1 04/10/2015   VLDL 33 04/10/2015   LDLCALC 70 04/10/2015    Physical Findings: AIMS:  , ,  ,  ,    CIWA:    COWS:     Musculoskeletal: Strength & Muscle Tone: within normal limits Gait & Station: normal Patient leans: N/A  Psychiatric Specialty Exam: Physical Exam Vitals and nursing note reviewed.  Constitutional:      Appearance: He is well-developed.  HENT:     Head: Normocephalic and atraumatic.  Eyes:     Conjunctiva/sclera: Conjunctivae normal.     Pupils: Pupils are equal, round, and reactive to light.  Cardiovascular:     Heart sounds: Normal heart sounds.  Pulmonary:     Effort: Pulmonary effort is normal.  Abdominal:     Palpations: Abdomen is soft.  Musculoskeletal:        General: Normal range of motion.     Cervical back: Normal range of motion.  Skin:    General: Skin is warm and dry.  Neurological:     Mental Status: He is alert.     Motor: Tremor present.  Psychiatric:        Attention and Perception: He is inattentive.        Mood and Affect: Mood is anxious.        Speech: Speech is delayed.        Behavior: Behavior is agitated. Behavior is not aggressive.        Thought Content: Thought content does not include homicidal or suicidal ideation.        Cognition and Memory: Cognition is impaired. Memory is impaired.        Judgment: Judgment is impulsive.     Review of Systems  Constitutional: Negative.   HENT: Negative.   Eyes: Negative.   Respiratory: Negative.   Cardiovascular: Negative.   Gastrointestinal: Negative.   Musculoskeletal: Negative.   Skin: Negative.   Neurological: Negative.   Psychiatric/Behavioral: Positive for agitation and confusion. The patient is nervous/anxious.     Blood pressure 97/64, pulse 60, temperature 98.2 F (36.8 C), temperature source Oral, resp. rate 18, height 5\' 8"  (1.727  m), weight 104.3 kg, SpO2 100 %.Body mass index is 34.96 kg/m.  General Appearance: Casual  Eye Contact:  Good  Speech:  Clear and Coherent  Volume:  Decreased  Mood:  Anxious  Affect:  Congruent  Thought Process:  Disorganized  Orientation:  Full (Time, Place, and Person)  Thought Content:  Illogical  Suicidal Thoughts:  No  Homicidal Thoughts:  No  Memory:  Immediate;   Poor Recent;   Fair Remote;   Fair  Judgement:  Impaired  Insight:  Shallow  Psychomotor Activity:  Decreased  Concentration:  Concentration: Poor  Recall:  Poor  Fund of Knowledge:  Fair  Language:  Fair  Akathisia:  No  Handed:  Right  AIMS (if indicated):     Assets:  Desire for Improvement  ADL's:  Impaired  Cognition:  Impaired,  Mild  Sleep:  Number of Hours: 7     Treatment Plan Summary: Daily contact with patient to assess and evaluate symptoms and progress in treatment, Medication management and Plan Continue clonazepam but increase to 4 times a day and also increase Seroquel to 4 times a day.  Continue current monitoring of blood sugars.  Lots of psychoeducation and support for the patient.  Increased Ditropan dose and increase to twice a day to try to control the urinary incontinence  Alethia Berthold, MD 02/22/2020, 1:47 PM

## 2020-02-23 LAB — GLUCOSE, CAPILLARY
Glucose-Capillary: 96 mg/dL (ref 70–99)
Glucose-Capillary: 115 mg/dL — ABNORMAL HIGH (ref 70–99)
Glucose-Capillary: 71 mg/dL (ref 70–99)
Glucose-Capillary: 172 mg/dL — ABNORMAL HIGH (ref 70–99)

## 2020-02-23 NOTE — Plan of Care (Signed)
  Problem: Education: Goal: Knowledge of [facility] Education information/materials will improve Outcome: Progressing Goal: Emotional status will improve Outcome: Progressing Goal: Mental status will improve Outcome: Progressing Goal: Verbalization of understanding the information provided will improve Outcome: Progressing   Problem: Activity: Goal: Interest or engagement in activities will improve Outcome: Progressing Goal: Sleeping patterns will improve Outcome: Progressing   Problem: Coping: Goal: Ability to verbalize frustrations and anger appropriately will improve Outcome: Progressing Goal: Ability to demonstrate self-control will improve Outcome: Progressing   Problem: Health Behavior/Discharge Planning: Goal: Identification of resources available to assist in meeting health care needs will improve Outcome: Progressing Goal: Compliance with treatment plan for underlying cause of condition will improve Outcome: Progressing   Problem: Physical Regulation: Goal: Ability to maintain clinical measurements within normal limits will improve Outcome: Progressing   Problem: Safety: Goal: Periods of time without injury will increase Outcome: Progressing   Problem: Education: Goal: Ability to make informed decisions regarding treatment will improve Outcome: Progressing   Problem: Coping: Goal: Coping ability will improve Outcome: Progressing   Problem: Health Behavior/Discharge Planning: Goal: Identification of resources available to assist in meeting health care needs will improve Outcome: Progressing   Problem: Medication: Goal: Compliance with prescribed medication regimen will improve Outcome: Progressing   Problem: Self-Concept: Goal: Ability to disclose and discuss suicidal ideas will improve Outcome: Progressing Goal: Will verbalize positive feelings about self Outcome: Progressing   

## 2020-02-23 NOTE — Progress Notes (Signed)
Patient is pleasant and easy to engage.  He denies SI/HI/AH/VH depression and pain at this time.  He endorses anxiety that he states is part of his everyday life.  He reports feeling better and and states he less stressed and more clearer since becoming inpatient.  He received his prescribed meds and tolerated and without incident.  He remains safe on the unit with 15 minute safety checks and was informed to contact staff with any concerns.    Cleo Virgilio Belling, LPN

## 2020-02-23 NOTE — Progress Notes (Signed)
[facility] MD Progress Note  02/23/2020 11:32 AM Alexander Beard  MRN:  [id number] Subjective: Follow-up for 64 year old man with schizoaffective disorder and autistic disorder.  Patient spoke with me for a while today.  Overall he indicated that he was starting to calm down and feel a little better.  He had a productive talk with the chaplain yesterday.  He is appreciating groups and activities here.  He did mention to me that he is hearing and seeing things on the television that he thinks are related to his life.  I spent some time trying to clarify this and it does sound like he may be actually referring to real ideas of reference.  If so that is probably the most definite psychotic symptom I have ever heard him articulate.  Otherwise however he is not agitated has not been having any spells of melting down.  He does still have some trouble with enuresis and he is aware of it as well and wants to work on getting that improved. Principal Problem: Schizoaffective disorder, bipolar type ([place]) Diagnosis: Principal Problem:   Schizoaffective disorder, bipolar type ([place]) Active Problems:   Active autistic disorder   Diabetes ([place])   Hypertension   Prostate hypertrophy  Total Time spent with patient: 30 minutes  Past Psychiatric History: Longstanding history of chronic mental illness and instability  Past Medical History:  Past Medical History:  Diagnosis Date  . Anxiety   . Anxiety disorder due to known physiological condition    HOSPITALIZED 10/18  . Arthritis   . Autistic disorder, residual state   . COPD (chronic obstructive pulmonary disease) ([place])   . Depression   . Developmental disorder   . Diabetes mellitus without complication ([place])   . Dyslipidemia   . Esophageal reflux   . HOH (hard of hearing)    MILDLY  . Hypertension   . Obesity   . Overactive bladder   . Palpitations    ANXIETY  . Schizophrenia, schizoaffective ([place])   . Sleep apnea   . Tremors of nervous system    HANDS  DUE TO MEDICATIONS  . Urinary incontinence     Past Surgical History:  Procedure Laterality Date  . CATARACT EXTRACTION W/PHACO Left 11/14/2017   Procedure: CATARACT EXTRACTION PHACO AND INTRAOCULAR LENS PLACEMENT (IOC);  Surgeon: Birder Robson, MD;  Location: [facility] ORS;  Service: Ophthalmology;  Laterality: Left;  [place] 00:42 AP% 14.6 CDE 6.12 Fluid pack lot # [id number]  . COLONOSCOPY  01/28/2005  . COLONOSCOPY WITH PROPOFOL N/A 05/09/2016   Procedure: COLONOSCOPY WITH PROPOFOL;  Surgeon: Manya Silvas, MD;  Location: [facility] ENDOSCOPY;  Service: Endoscopy;  Laterality: N/A;  . ESOPHAGOGASTRODUODENOSCOPY N/A 05/09/2016   Procedure: ESOPHAGOGASTRODUODENOSCOPY (EGD);  Surgeon: Manya Silvas, MD;  Location: [facility] ENDOSCOPY;  Service: Endoscopy;  Laterality: N/A;  . FRACTURE SURGERY     ORIF SHOULDER  . TONSILLECTOMY     Family History:  Family History  Problem Relation Age of Onset  . Hypertension Mother   . Stroke Father   . Heart Problems Father    Family Psychiatric  History: See previous Social History:  Social History   Substance and Sexual Activity  Alcohol Use No  . Alcohol/week: 0.0 standard drinks     Social History   Substance and Sexual Activity  Drug Use No    Social History   Socioeconomic History  . Marital status: Single    Spouse name: Not on file  . Number of children: Not on file  .  Years of education: Not on file  . Highest education level: Not on file  Occupational History  . Not on file  Tobacco Use  . Smoking status: Never Smoker  . Smokeless tobacco: Never Used  Vaping Use  . Vaping Use: Never used  Substance and Sexual Activity  . Alcohol use: No    Alcohol/week: 0.0 standard drinks  . Drug use: No  . Sexual activity: Never  Other Topics Concern  . Not on file  Social History Narrative   The patient never finished high school but did get his GED. He works in the past as a Museum/gallery conservator. He has never been married and has  no children. He is currently in disability and [name] is his legal guardian. He has been living in group homes for many years.      No pending legal charges   Social Determinants of Health   Financial Resource Strain:   . Difficulty of Paying Living Expenses:   Food Insecurity:   . Worried About Charity fundraiser in the Last Year:   . Arboriculturist in the Last Year:   Transportation Needs:   . Film/video editor (Medical):   Marland Kitchen Lack of Transportation (Non-Medical):   Physical Activity:   . Days of Exercise per Week:   . Minutes of Exercise per Session:   Stress:   . Feeling of Stress :   Social Connections:   . Frequency of Communication with Friends and Family:   . Frequency of Social Gatherings with Friends and Family:   . Attends Religious Services:   . Active Member of Clubs or Organizations:   . Attends Archivist Meetings:   Marland Kitchen Marital Status:    Additional Social History:                         Sleep: Fair  Appetite:  Fair  Current Medications: Current Facility-Administered Medications  Medication Dose Route Frequency Provider Last Rate Last Admin  . acetaminophen (TYLENOL) tablet 650 mg  650 mg Oral Q6H PRN [name], MD      . alum & mag hydroxide-simeth (MAALOX/MYLANTA) 200-200-20 MG/5ML suspension 30 mL  30 mL Oral Q4H PRN [name], MD      . aspirin EC tablet 81 mg  81 mg Oral Daily Joshua Zeringue, [name], MD   81 mg at 02/23/20 0934  . clonazePAM (KLONOPIN) tablet 1 mg  1 mg Oral QID Makel Mcmann, [name], MD   1 mg at 02/23/20 0933  . docusate sodium (COLACE) capsule 100 mg  100 mg Oral BID Braedyn Kauk, [name], MD   100 mg at 02/23/20 0934  . gabapentin (NEURONTIN) capsule 100 mg  100 mg Oral TID Remi Lopata, [name], MD   100 mg at 02/23/20 0935  . hydrOXYzine (ATARAX/VISTARIL) tablet 10 mg  10 mg Oral TID PRN [name], MD   10 mg at 02/20/20 2127  . insulin aspart (novoLOG) injection 0-5 Units  0-5 Units Subcutaneous  QHS Keeli Roberg T, MD      . insulin aspart (novoLOG) injection 0-9 Units  0-9 Units Subcutaneous TID WC Rondarius Kadrmas, [name], MD   2 Units at 02/21/20 1623  . lamoTRIgine (LAMICTAL) tablet 100 mg  100 mg Oral QHS Rivky Clendenning T, MD   100 mg at 02/22/20 2124  . linagliptin (TRADJENTA) tablet 5 mg  5 mg Oral Daily Zia Kanner, [name], MD  5 mg at 02/23/20 0933  . loratadine (CLARITIN) tablet 10 mg  10 mg Oral Daily Imaan Padgett, [name], MD   10 mg at 02/23/20 0934  . magnesium hydroxide (MILK OF MAGNESIA) suspension 30 mL  30 mL Oral Daily PRN [name], MD      . metFORMIN (GLUCOPHAGE) tablet 500 mg  500 mg Oral BID WC Hildegard Hlavac, [name], MD   500 mg at 02/23/20 0935  . metoprolol succinate (TOPROL-XL) 24 hr tablet 50 mg  50 mg Oral Daily Kooper Godshall, [name], MD   50 mg at 02/23/20 0933  . oxybutynin (DITROPAN) tablet 10 mg  10 mg Oral BID Balian Schaller, [name], MD   10 mg at 02/23/20 1024  . pantoprazole (PROTONIX) EC tablet 40 mg  40 mg Oral Daily Joniece Smotherman, [name], MD   40 mg at 02/23/20 0934  . QUEtiapine (SEROQUEL) tablet 100 mg  100 mg Oral QID Kampbell Holaway, [name], MD   100 mg at 02/23/20 0934  . QUEtiapine (SEROQUEL) tablet 400 mg  400 mg Oral QHS [name], NP   400 mg at 02/22/20 2124  . sertraline (ZOLOFT) tablet 100 mg  100 mg Oral BID Tymarion Everard, [name], MD   100 mg at 02/23/20 0933  . simvastatin (ZOCOR) tablet 20 mg  20 mg Oral q1800 Jaxson Anglin, [name], MD   20 mg at 02/22/20 1656    Lab Results:  Results for orders placed or performed during the hospital encounter of 02/19/20 (from the past 48 hour(s))  Glucose, capillary     Status: None   Collection Time: 02/21/20 11:43 AM  Result Value Ref Range   Glucose-Capillary 90 70 - 99 mg/dL    Comment: Glucose reference range applies only to samples taken after fasting for at least 8 hours.   Comment 1 Notify RN   Glucose, capillary     Status: Abnormal   Collection Time: 02/21/20  4:19 PM  Result Value Ref Range   Glucose-Capillary 180 (H) 70 -  99 mg/dL    Comment: Glucose reference range applies only to samples taken after fasting for at least 8 hours.  Glucose, capillary     Status: Abnormal   Collection Time: 02/21/20 10:10 PM  Result Value Ref Range   Glucose-Capillary 113 (H) 70 - 99 mg/dL    Comment: Glucose reference range applies only to samples taken after fasting for at least 8 hours.  Glucose, capillary     Status: Abnormal   Collection Time: 02/22/20  8:43 AM  Result Value Ref Range   Glucose-Capillary 120 (H) 70 - 99 mg/dL    Comment: Glucose reference range applies only to samples taken after fasting for at least 8 hours.  Glucose, capillary     Status: Abnormal   Collection Time: 02/22/20 11:43 AM  Result Value Ref Range   Glucose-Capillary 130 (H) 70 - 99 mg/dL    Comment: Glucose reference range applies only to samples taken after fasting for at least 8 hours.  Glucose, capillary     Status: Abnormal   Collection Time: 02/22/20  4:53 PM  Result Value Ref Range   Glucose-Capillary 112 (H) 70 - 99 mg/dL    Comment: Glucose reference range applies only to samples taken after fasting for at least 8 hours.  Glucose, capillary     Status: Abnormal   Collection Time: 02/22/20  8:08 PM  Result Value Ref Range   Glucose-Capillary 116 (H) 70 - 99 mg/dL  Comment: Glucose reference range applies only to samples taken after fasting for at least 8 hours.   Comment 1 Notify RN   Glucose, capillary     Status: None   Collection Time: 02/23/20  7:02 AM  Result Value Ref Range   Glucose-Capillary 96 70 - 99 mg/dL    Comment: Glucose reference range applies only to samples taken after fasting for at least 8 hours.  Glucose, capillary     Status: Abnormal   Collection Time: 02/23/20 11:22 AM  Result Value Ref Range   Glucose-Capillary 115 (H) 70 - 99 mg/dL    Comment: Glucose reference range applies only to samples taken after fasting for at least 8 hours.   Comment 1 Notify RN     Blood Alcohol level:  Lab Results   Component Value Date   ETH <10 01/22/2020   ETH <10 56/21/3086    Metabolic Disorder Labs: Lab Results  Component Value Date   HGBA1C 6.6 (H) 02/20/2020   MPG 142.72 02/20/2020   No results found for: PROLACTIN Lab Results  Component Value Date   CHOL 153 04/10/2015   TRIG 166 (H) 04/10/2015   HDL 50 04/10/2015   CHOLHDL 3.1 04/10/2015   VLDL 33 04/10/2015   LDLCALC 70 04/10/2015    Physical Findings: AIMS:  , ,  ,  ,    CIWA:    COWS:     Musculoskeletal: Strength & Muscle Tone: within normal limits Gait & Station: normal Patient leans: N/A  Psychiatric Specialty Exam: Physical Exam Vitals and nursing note reviewed.  Constitutional:      Appearance: He is well-developed.  HENT:     Head: Normocephalic and atraumatic.  Eyes:     Conjunctiva/sclera: Conjunctivae normal.     Pupils: Pupils are equal, round, and reactive to light.  Cardiovascular:     Heart sounds: Normal heart sounds.  Pulmonary:     Effort: Pulmonary effort is normal.  Abdominal:     Palpations: Abdomen is soft.  Musculoskeletal:        General: Normal range of motion.     Cervical back: Normal range of motion.  Skin:    General: Skin is warm and dry.  Neurological:     Mental Status: He is alert.     Motor: Tremor present.  Psychiatric:        Attention and Perception: Attention normal.        Mood and Affect: Mood is anxious.        Speech: Speech is delayed.        Behavior: Behavior is slowed.        Thought Content: Thought content is paranoid. Thought content does not include homicidal or suicidal ideation.        Cognition and Memory: Cognition is impaired.        Judgment: Judgment is impulsive.     Review of Systems  Constitutional: Negative.   HENT: Negative.   Eyes: Negative.   Respiratory: Negative.   Cardiovascular: Negative.   Gastrointestinal: Negative.   Genitourinary: Positive for enuresis.  Musculoskeletal: Negative.   Skin: Negative.   Neurological:  Negative.   Psychiatric/Behavioral: Positive for confusion and dysphoric mood.    Blood pressure 107/74, pulse (!) 59, temperature 98.2 F (36.8 C), temperature source Oral, resp. rate 18, height 5\' 8"  (1.727 m), weight 104.3 kg, SpO2 99 %.Body mass index is 34.96 kg/m.  General Appearance: Disheveled  Eye Contact:  Fair  Speech:  Clear and  Coherent and Slow  Volume:  Decreased  Mood:  Anxious and Dysphoric  Affect:  Congruent  Thought Process:  Coherent  Orientation:  Full (Time, Place, and Person)  Thought Content:  Ideas of Reference:   Paranoia, Rumination and Tangential  Suicidal Thoughts:  No  Homicidal Thoughts:  No  Memory:  Immediate;   Fair Recent;   Fair Remote;   Fair  Judgement:  Impaired  Insight:  Shallow  Psychomotor Activity:  Decreased  Concentration:  Concentration: Poor  Recall:  [employer] of Knowledge:  Fair  Language:  Fair  Akathisia:  No  Handed:  Right  AIMS (if indicated):     Assets:  Desire for Improvement  ADL's:  Impaired  Cognition:  Impaired,  Mild  Sleep:  Number of Hours: 8     Treatment Plan Summary: Daily contact with patient to assess and evaluate symptoms and progress in treatment, Medication management and Plan Patient is still confused but is getting better.  Not agitated or threatening.  Medically his blood sugars are actually under pretty good control making that less likely of a direct cause of enuresis.  He is on full dose did triptan now.  I am going to order a urine analysis which was not done on admission to rule out infection.  If that is not helping we may consult urology.  No change to psychiatric medicine for today.  Lots of support and encouragement.  Alethia Berthold, MD 02/23/2020, 11:32 AM

## 2020-02-23 NOTE — Progress Notes (Signed)
Patient is pleasant and easy to engage. She seeks constant assurance from staff and even other peers on the unit.  He is alert and oriented x4. He denies Si/HI/AH/VH and pain at his encounter.  He does endorse anxiety and depression.He is less anxious and appears to be acclimated to the unit nicely. He uses coloring and drawing as a coping mechanism.  He received prescribed meds and tolerated without incident. He remains safe with 15 minute safety and informed to contact staff with any concerns.    Cleo Butler-Nicholson, LPN

## 2020-02-23 NOTE — Progress Notes (Signed)
Pt is alert and oriented to person, place, time and situation. Pt is calm, cooperative, pleasant. Pt has been coloring in the dayroom with peers, is medication complaint, reports good sleep and has a good appetite. Pt reports he thinks he is doing better and his thoughts are clearer, and has been able to find his way around the unit without getting lost. Pt has been attending groups and participates in unit programming. No distress noted, none reported, pt voices no complaints. Will continue to monitor pt per Q15 minute face checks and monitor for safety and progress.

## 2020-02-23 NOTE — Plan of Care (Signed)
°  Problem: Education: Goal: Knowledge of [place] [facility] Education information/materials will improve Outcome: Progressing Goal: Emotional status will improve Outcome: Progressing Goal: Mental status will improve Outcome: Progressing Goal: Verbalization of understanding the information provided will improve Outcome: Progressing   Problem: Activity: Goal: Interest or engagement in activities will improve Outcome: Progressing Goal: Sleeping patterns will improve Outcome: Progressing   Problem: Coping: Goal: Ability to verbalize frustrations and anger appropriately will improve Outcome: Progressing Goal: Ability to demonstrate self-control will improve Outcome: Progressing   Problem: Health Behavior/Discharge Planning: Goal: Identification of resources available to assist in meeting health care needs will improve Outcome: Progressing Goal: Compliance with treatment plan for underlying cause of condition will improve Outcome: Progressing   Problem: Physical Regulation: Goal: Ability to maintain clinical measurements within normal limits will improve Outcome: Progressing   Problem: Safety: Goal: Periods of time without injury will increase Outcome: Progressing   Problem: Education: Goal: Ability to make informed decisions regarding treatment will improve Outcome: Progressing   Problem: Coping: Goal: Coping ability will improve Outcome: Progressing   Problem: Health Behavior/Discharge Planning: Goal: Identification of resources available to assist in meeting health care needs will improve Outcome: Progressing   Problem: Medication: Goal: Compliance with prescribed medication regimen will improve Outcome: Progressing   Problem: Self-Concept: Goal: Ability to disclose and discuss suicidal ideas will improve Outcome: Progressing Goal: Will verbalize positive feelings about self Outcome: Progressing   

## 2020-02-23 NOTE — [facility] Group Notes (Signed)
[facility] LCSW Group Therapy Note  Date/Time:  02/23/2020 1:15 PM- 2:05 PM   Type of Therapy and Topic:  Group Therapy:  Healthy and Unhealthy Supports  Participation Level:  Active   Description of Group:  Patients in this group were introduced to the idea of adding a variety of healthy supports to address the various needs in their lives.Patients discussed what additional healthy supports could be helpful in their recovery and wellness after discharge in order to prevent future hospitalizations.   An emphasis was placed on using counselor, doctor, therapy groups, 12-step groups, and problem-specific support groups to expand supports.  They also worked as a group on developing a specific plan for several patients to deal with unhealthy supports through [place], psychoeducation with loved ones, and even termination of relationships.   Therapeutic Goals:   1)  discuss importance of adding supports to stay well once out of the hospital  2)  compare healthy versus unhealthy supports and identify some examples of each  3)  generate ideas and descriptions of healthy supports that can be added  4)  offer mutual support about how to address unhealthy supports  5)  encourage active participation in and adherence to discharge plan    Summary of Patient Progress:  Patient checked into group thinking better and having a good day. Patient spoke about how he use to do things his own way but now he needs help and support. Patient stated that he needs guidance and also wants to connect with God. Patient stated that he wants to go back to [place] and get peer support. Patient stated he like having peer support in the past and they allowed him to get out and around. Patient would like to love his family better and understand them better.  Therapeutic Modalities:   Motivational Interviewing Brief Solution-Focused Therapy  Alexander Beard 02/23/2020  2:37 PM

## 2020-02-24 LAB — URINALYSIS, COMPLETE (UACMP) WITH MICROSCOPIC
Bacteria, UA: NONE SEEN
Bilirubin Urine: NEGATIVE
Glucose, UA: NEGATIVE mg/dL
Hgb urine dipstick: NEGATIVE
Ketones, ur: NEGATIVE mg/dL
Leukocytes,Ua: NEGATIVE
Nitrite: NEGATIVE
Protein, ur: NEGATIVE mg/dL
Specific Gravity, Urine: 1.016 (ref 1.005–1.030)
pH: 5 (ref 5.0–8.0)

## 2020-02-24 LAB — GLUCOSE, CAPILLARY
Glucose-Capillary: 121 mg/dL — ABNORMAL HIGH (ref 70–99)
Glucose-Capillary: 101 mg/dL — ABNORMAL HIGH (ref 70–99)
Glucose-Capillary: 103 mg/dL — ABNORMAL HIGH (ref 70–99)
Glucose-Capillary: 98 mg/dL (ref 70–99)

## 2020-02-24 MED ORDER — METFORMIN HCL 850 MG PO TABS
850.0000 mg | ORAL_TABLET | Freq: Two times a day (BID) | ORAL | Status: DC
  Administered 2020-02-24 – 2020-06-08 (×209): 850 mg via ORAL
  Filled 2020-02-24 (×211): qty 1

## 2020-02-24 NOTE — Progress Notes (Signed)
Recreation Therapy Notes   Date: 02/24/2020  Time: 9:30 am  Location: Craft room   Behavioral response: Appropriate  Intervention Topic: Stress Management   Discussion/Intervention:  Group content on today was focused on stress. The group defined stress and way to cope with stress. Participants expressed how they know when they are stresses out. Individuals described the different ways they have to cope with stress. The group stated reasons why it is important to cope with stress. Patient explained what good stress is and some examples. The group participated in the intervention "Stress Management Jeopardy". Individuals were separated into two group and answered questions related to stress.  Clinical Observations/Feedback:  Patient came to group and expressed that he manages his stress by doing art, drawing and coloring. He explained that stress management is important to control his emotions and not over do it.  Individual was social with peers and staff while participating in the intervention. Glenroy Crossen LRT/CTRS         Ellyse Rotolo 02/24/2020 11:35 AM

## 2020-02-24 NOTE — Plan of Care (Signed)
Patient appears less anxious today.Patient states " I feel pretty good now." Patient asked apology to staff for simple things stated " I am sorry for causing trouble for you guys." Denies SI,HI and AVH.Visible in the milieu.Appropriate with staff & peers.Compliant with medications.Appetite and energy level good.Attended groups.Support and encouragement given.

## 2020-02-24 NOTE — Progress Notes (Signed)
[facility] MD Progress Note  02/24/2020 4:43 PM Alexander Beard  MRN:  [id number] Subjective: Follow-up for 64 year old man with schizoaffective disorder.  Patient came to see me today and told me he is feeling much better.  He was actually pretty lucid today and discussing his future plans.  All of his usual concerns but more rational about it.  Not showing nearly as much tremor as he had been.  He has been on the unit today around other people and has been minding his own business but not isolating. Principal Problem: Schizoaffective disorder, bipolar type ([place]) Diagnosis: Principal Problem:   Schizoaffective disorder, bipolar type ([place]) Active Problems:   Active autistic disorder   Diabetes ([place])   Hypertension   Prostate hypertrophy  Total Time spent with patient: 30 minutes  Past Psychiatric History: Past history of long standing schizoaffective and autistic disorder  Past Medical History:  Past Medical History:  Diagnosis Date  . Anxiety   . Anxiety disorder due to known physiological condition    HOSPITALIZED 10/18  . Arthritis   . Autistic disorder, residual state   . COPD (chronic obstructive pulmonary disease) ([place])   . Depression   . Developmental disorder   . Diabetes mellitus without complication ([place])   . Dyslipidemia   . Esophageal reflux   . HOH (hard of hearing)    MILDLY  . Hypertension   . Obesity   . Overactive bladder   . Palpitations    ANXIETY  . Schizophrenia, schizoaffective ([place])   . Sleep apnea   . Tremors of nervous system    HANDS DUE TO MEDICATIONS  . Urinary incontinence     Past Surgical History:  Procedure Laterality Date  . CATARACT EXTRACTION W/PHACO Left 11/14/2017   Procedure: CATARACT EXTRACTION PHACO AND INTRAOCULAR LENS PLACEMENT (IOC);  Surgeon: Birder Robson, MD;  Location: [facility] ORS;  Service: Ophthalmology;  Laterality: Left;  [place] 00:42 AP% 14.6 CDE 6.12 Fluid pack lot # [id number]  . COLONOSCOPY  01/28/2005  . COLONOSCOPY WITH  PROPOFOL N/A 05/09/2016   Procedure: COLONOSCOPY WITH PROPOFOL;  Surgeon: Manya Silvas, MD;  Location: [facility] ENDOSCOPY;  Service: Endoscopy;  Laterality: N/A;  . ESOPHAGOGASTRODUODENOSCOPY N/A 05/09/2016   Procedure: ESOPHAGOGASTRODUODENOSCOPY (EGD);  Surgeon: Manya Silvas, MD;  Location: [facility] ENDOSCOPY;  Service: Endoscopy;  Laterality: N/A;  . FRACTURE SURGERY     ORIF SHOULDER  . TONSILLECTOMY     Family History:  Family History  Problem Relation Age of Onset  . Hypertension Mother   . Stroke Father   . Heart Problems Father    Family Psychiatric  History: See previous Social History:  Social History   Substance and Sexual Activity  Alcohol Use No  . Alcohol/week: 0.0 standard drinks     Social History   Substance and Sexual Activity  Drug Use No    Social History   Socioeconomic History  . Marital status: Single    Spouse name: Not on file  . Number of children: Not on file  . Years of education: Not on file  . Highest education level: Not on file  Occupational History  . Not on file  Tobacco Use  . Smoking status: Never Smoker  . Smokeless tobacco: Never Used  Vaping Use  . Vaping Use: Never used  Substance and Sexual Activity  . Alcohol use: No    Alcohol/week: 0.0 standard drinks  . Drug use: No  . Sexual activity: Never  Other Topics Concern  . Not  on file  Social History Narrative   The patient never finished high school but did get his GED. 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 He is currently in disability and [name] is his legal guardian. He has been living in group homes for many years.      No pending legal charges   Social Determinants of Health   Financial Resource Strain:   . Difficulty of Paying Living Expenses:   Food Insecurity:   . Worried About Charity fundraiser in the Last Year:   . Arboriculturist in the Last Year:   Transportation Needs:   . Consulting civil engineer (Medical):   Marland Kitchen Lack of Transportation (Non-Medical):   Physical Activity:   . Days of Exercise per Week:   . Minutes of Exercise per Session:   Stress:   . Feeling of Stress :   Social Connections:   . Frequency of Communication with Friends and Family:   . Frequency of Social Gatherings with Friends and Family:   . Attends Religious Services:   . Active Member of Clubs or Organizations:   . Attends Archivist Meetings:   Marland Kitchen Marital Status:    Additional Social History:                         Sleep: Fair  Appetite:  Fair  Current Medications: Current Facility-Administered Medications  Medication Dose Route Frequency Provider Last Rate Last Admin  . acetaminophen (TYLENOL) tablet 650 mg  650 mg Oral Q6H PRN [name], MD      . alum & mag hydroxide-simeth (MAALOX/MYLANTA) 200-200-20 MG/5ML suspension 30 mL  30 mL Oral Q4H PRN [name], MD      . aspirin EC tablet 81 mg  81 mg Oral Daily Olevia Westervelt, [name], MD   81 mg at 02/24/20 0806  . clonazePAM (KLONOPIN) tablet 1 mg  1 mg Oral QID Jin Shockley, [name], MD   1 mg at 02/24/20 1208  . docusate sodium (COLACE) capsule 100 mg  100 mg Oral BID Dian Minahan, [name], MD   100 mg at 02/24/20 0806  . gabapentin (NEURONTIN) capsule 100 mg  100 mg Oral TID Yancarlos Berthold, [name], MD   100 mg at 02/24/20 1208  . hydrOXYzine (ATARAX/VISTARIL) tablet 10 mg  10 mg Oral TID PRN [name], MD   10 mg at 02/20/20 2127  . insulin aspart (novoLOG) injection 0-5 Units  0-5 Units Subcutaneous QHS Camelle Henkels T, MD      . insulin aspart (novoLOG) injection 0-9 Units  0-9 Units Subcutaneous TID WC Matti Killingsworth, [name], MD   1 Units at 02/24/20 0805  . lamoTRIgine (LAMICTAL) tablet 100 mg  100 mg Oral QHS Waverly Chavarria T, MD   100 mg at 02/23/20 2141  . linagliptin (TRADJENTA) tablet 5 mg  5 mg Oral Daily Mishti Swanton, [name], MD   5 mg at 02/24/20 0806  . loratadine (CLARITIN) tablet 10 mg  10 mg Oral Daily Rydell Wiegel, [name], MD   10 mg at 02/24/20 0807  . magnesium hydroxide (MILK OF MAGNESIA) suspension 30 mL  30 mL Oral Daily PRN [name], MD      . metFORMIN (GLUCOPHAGE) tablet 500 mg  500 mg Oral BID WC Vear Staton, [name], MD   500 mg at 02/24/20 0807  . metoprolol succinate (TOPROL-XL)  24 hr tablet 50 mg  50 mg Oral Daily Jehad Bisono, [name], MD   50 mg at 02/24/20 0806  . oxybutynin (DITROPAN) tablet 10 mg  10 mg Oral BID Sherel Fennell, [name], MD   10 mg at 02/24/20 0807  . pantoprazole (PROTONIX) EC tablet 40 mg  40 mg Oral Daily Jozalynn Noyce, [name], MD   40 mg at 02/24/20 0807  . QUEtiapine (SEROQUEL) tablet 100 mg  100 mg Oral QID Marthella Osorno, [name], MD   100 mg at 02/24/20 1208  . QUEtiapine (SEROQUEL) tablet 400 mg  400 mg Oral QHS [name], NP   400 mg at 02/23/20 2142  . sertraline (ZOLOFT) tablet 100 mg  100 mg Oral BID Daichi Moris, [name], MD   100 mg at 02/24/20 0806  . simvastatin (ZOCOR) tablet 20 mg  20 mg Oral q1800 Lovinia Snare, [name], MD   20 mg at 02/23/20 1706    Lab Results:  Results for orders placed or performed during the hospital encounter of 02/19/20 (from the past 48 hour(s))  Glucose, capillary     Status: Abnormal   Collection Time: 02/22/20  4:53 PM  Result Value Ref Range   Glucose-Capillary 112 (H) 70 - 99 mg/dL    Comment: Glucose reference range applies only to samples taken after fasting for at least 8 hours.  Glucose, capillary     Status: Abnormal   Collection Time: 02/22/20  8:08 PM  Result Value Ref Range   Glucose-Capillary 116 (H) 70 - 99 mg/dL    Comment: Glucose reference range applies only to samples taken after fasting for at least 8 hours.   Comment 1 Notify RN   Glucose, capillary     Status: None   Collection Time: 02/23/20  7:02 AM  Result Value Ref Range   Glucose-Capillary 96 70 - 99 mg/dL    Comment: Glucose reference range applies only to samples taken after fasting for at least 8 hours.  Glucose, capillary     Status: Abnormal   Collection Time:  02/23/20 11:22 AM  Result Value Ref Range   Glucose-Capillary 115 (H) 70 - 99 mg/dL    Comment: Glucose reference range applies only to samples taken after fasting for at least 8 hours.   Comment 1 Notify RN   Glucose, capillary     Status: None   Collection Time: 02/23/20  4:12 PM  Result Value Ref Range   Glucose-Capillary 71 70 - 99 mg/dL    Comment: Glucose reference range applies only to samples taken after fasting for at least 8 hours.  Glucose, capillary     Status: Abnormal   Collection Time: 02/23/20  9:50 PM  Result Value Ref Range   Glucose-Capillary 172 (H) 70 - 99 mg/dL    Comment: Glucose reference range applies only to samples taken after fasting for at least 8 hours.  Glucose, capillary     Status: Abnormal   Collection Time: 02/24/20  7:03 AM  Result Value Ref Range   Glucose-Capillary 121 (H) 70 - 99 mg/dL    Comment: Glucose reference range applies only to samples taken after fasting for at least 8 hours.  Glucose, capillary     Status: Abnormal   Collection Time: 02/24/20 11:18 AM  Result Value Ref Range   Glucose-Capillary 101 (H) 70 - 99 mg/dL    Comment: Glucose reference range applies only to samples taken after fasting for at least 8 hours.   Comment 1 Notify RN  Glucose, capillary     Status: Abnormal   Collection Time: 02/24/20  4:02 PM  Result Value Ref Range   Glucose-Capillary 103 (H) 70 - 99 mg/dL    Comment: Glucose reference range applies only to samples taken after fasting for at least 8 hours.   Comment 1 Notify RN     Blood Alcohol level:  Lab Results  Component Value Date   ETH <10 01/22/2020   ETH <10 14/78/2956    Metabolic Disorder Labs: Lab Results  Component Value Date   HGBA1C 6.6 (H) 02/20/2020   MPG 142.72 02/20/2020   No results found for: PROLACTIN Lab Results  Component Value Date   CHOL 153 04/10/2015   TRIG 166 (H) 04/10/2015   HDL 50 04/10/2015   CHOLHDL 3.1 04/10/2015   VLDL 33 04/10/2015   LDLCALC 70  04/10/2015    Physical Findings: AIMS:  , ,  ,  ,    CIWA:    COWS:     Musculoskeletal: Strength & Muscle Tone: within normal limits Gait & Station: normal Patient leans: N/A  Psychiatric Specialty Exam: Physical Exam Vitals and nursing note reviewed.  Constitutional:      Appearance: He is well-developed.  HENT:     Head: Normocephalic and atraumatic.  Eyes:     Conjunctiva/sclera: Conjunctivae normal.     Pupils: Pupils are equal, round, and reactive to light.  Cardiovascular:     Heart sounds: Normal heart sounds.  Pulmonary:     Effort: Pulmonary effort is normal.  Abdominal:     Palpations: Abdomen is soft.  Musculoskeletal:        General: Normal range of motion.     Cervical back: Normal range of motion.  Skin:    General: Skin is warm and dry.  Neurological:     Mental Status: He is alert.  Psychiatric:        Attention and Perception: Attention normal.        Mood and Affect: Mood is anxious. Affect is labile.        Speech: Speech is tangential.        Behavior: Behavior is agitated. Behavior is not aggressive.        Thought Content: Thought content is not paranoid. Thought content does not include homicidal or suicidal ideation.        Cognition and Memory: Cognition is impaired.        Judgment: Judgment is impulsive.     Review of Systems  Constitutional: Negative.   HENT: Negative.   Eyes: Negative.   Respiratory: Negative.   Cardiovascular: Negative.   Gastrointestinal: Negative.   Musculoskeletal: Negative.   Skin: Negative.   Neurological: Negative.   Psychiatric/Behavioral: The patient is nervous/anxious.     Blood pressure 101/63, pulse 66, temperature 98 F (36.7 C), temperature source Oral, resp. rate 17, height 5\' 8"  (1.727 m), weight 104.3 kg, SpO2 99 %.Body mass index is 34.96 kg/m.  General Appearance: Disheveled  Eye Contact:  Fair  Speech:  Slow  Volume:  Decreased  Mood:  Dysphoric  Affect:  Congruent  Thought Process:   Coherent  Orientation:  Full (Time, Place, and Person)  Thought Content:  Logical  Suicidal Thoughts:  No  Homicidal Thoughts:  No  Memory:  Immediate;   Fair Recent;   Poor Remote;   Fair  Judgement:  Impaired  Insight:  Shallow  Psychomotor Activity:  Restlessness  Concentration:  Concentration: Fair  Recall:  [employer]  of Knowledge:  Fair  Language:  Fair  Akathisia:  No  Handed:  Right  AIMS (if indicated):     Assets:  Desire for Improvement Resilience Social Support  ADL's:  Impaired  Cognition:  Impaired,  Mild  Sleep:  Number of Hours: 4.5     Treatment Plan Summary: Daily contact with patient to assess and evaluate symptoms and progress in treatment, Medication management and Plan Certainly seems to be tolerating medicine and is doing better on the current dosage.  No aggression.  He talks about wanting to live independently but I am not sure whether he really has any capacity for that.  He will meet with Mr. Marshell Levan from [facility] tomorrow.  We are still working on placement issues.  Blood sugars are actually looking pretty good all things considered but he probably could stand to have a sliding Clarise and his Glucophage.  Alethia Berthold, MD 02/24/2020, 4:43 PM

## 2020-02-25 LAB — GLUCOSE, CAPILLARY
Glucose-Capillary: 94 mg/dL (ref 70–99)
Glucose-Capillary: 126 mg/dL — ABNORMAL HIGH (ref 70–99)

## 2020-02-25 NOTE — NC FL2 (Signed)
[place] LEVEL OF CARE SCREENING TOOL     IDENTIFICATION  Patient Name: Alexander Beard Birthdate: Jun 30, 1956 Sex: male Admission Date (Current Location): 02/19/2020  [facility] and [place] Number:  Engineering geologist and Address:  [facility], [address], [place], [place] [address]      Provider Number: [id number]  Attending Physician Name and Address:  Alexander Lex, MD  Relative Name and Phone Number:       Current Level of Care: Hospital Recommended Level of Care: [facility], Memory Care, Other (Comment) (Group Home) Prior Approval Number:    Date Approved/Denied:   PASRR Number:    Discharge Plan: Other (Comment) (Group Home, [place], [place])    Current Diagnoses: Patient Active Problem List   Diagnosis Date Noted  . Diabetes ([place]) 04/10/2015  . Hypertension 04/10/2015  . Prostate hypertrophy 04/10/2015  . Schizoaffective disorder ([place]) 04/10/2015  . Enlarged prostate 04/10/2015  . Essential (primary) hypertension 04/10/2015  . Type 2 diabetes mellitus ([place]) 04/10/2015  . Controlled type 2 diabetes mellitus without complication ([place]) 22/97/9892  . Schizoaffective disorder, bipolar type ([place]) 12/29/2014  . Active autistic disorder 12/29/2014    Orientation RESPIRATION BLADDER Height & Weight     Self, Situation, Place, Time  Normal Incontinent (Per nurse "incontinent at times") Weight: 229 lb 15 oz (104.3 kg) Height:  5\' 8"  (172.7 cm)  BEHAVIORAL SYMPTOMS/MOOD NEUROLOGICAL BOWEL NUTRITION STATUS  Wanderer, Verbally abusive (Pt has a history of wandering from his group homes.  Also has a hisotry of being verbally aggressive when upset.)  (NA) Continent Diet (Carb Modified diet due to diabetes.)  AMBULATORY STATUS COMMUNICATION OF NEEDS Skin   Independent Verbally Normal                       Personal Care Assistance Level of Assistance   (NA)           Functional Limitations Info    (NA)          SPECIAL CARE FACTORS FREQUENCY  Blood pressure (Diabetic, blood sugars maintained)                    Contractures Contractures Info: Not present    Additional Factors Info  Allergies, Code Status Code Status Info: Full Allergies Info: Penicillins, Cortizone-10, "myacins"           Current Medications (02/25/2020):  This is the current hospital active medication list Current Facility-Administered Medications  Medication Dose Route Frequency Provider Last Rate Last Admin  . acetaminophen (TYLENOL) tablet 650 mg  650 mg Oral Q6H PRN [name], MD      . alum & mag hydroxide-simeth (MAALOX/MYLANTA) 200-200-20 MG/5ML suspension 30 mL  30 mL Oral Q4H PRN [name], MD      . aspirin EC tablet 81 mg  81 mg Oral Daily [name], [name], MD   81 mg at 02/25/20 0804  . clonazePAM (KLONOPIN) tablet 1 mg  1 mg Oral QID [name], [name], MD   1 mg at 02/25/20 1135  . docusate sodium (COLACE) capsule 100 mg  100 mg Oral BID [name], [name], MD   100 mg at 02/25/20 0806  . gabapentin (NEURONTIN) capsule 100 mg  100 mg Oral TID [name], [name], MD   100 mg at 02/25/20 1135  . hydrOXYzine (ATARAX/VISTARIL) tablet 10 mg  10 mg Oral TID PRN [name], MD  10 mg at 02/20/20 2127  . insulin aspart (novoLOG) injection 0-5 Units  0-5 Units Subcutaneous QHS [name], [name], MD      . insulin aspart (novoLOG) injection 0-9 Units  0-9 Units Subcutaneous TID WC [name], [name], MD   1 Units at 02/25/20 1134  . lamoTRIgine (LAMICTAL) tablet 100 mg  100 mg Oral QHS [name], [name], MD   100 mg at 02/24/20 2112  . linagliptin (TRADJENTA) tablet 5 mg  5 mg Oral Daily [name], [name], MD   5 mg at 02/25/20 0807  . loratadine (CLARITIN) tablet 10 mg  10 mg Oral Daily [name], [name], MD   10 mg at 02/25/20 0806  . magnesium hydroxide (MILK OF MAGNESIA) suspension 30 mL  30 mL Oral Daily PRN [name], MD      . metFORMIN (GLUCOPHAGE) tablet 850 mg  850 mg Oral BID  WC [name], [name], MD   850 mg at 02/25/20 0804  . metoprolol succinate (TOPROL-XL) 24 hr tablet 50 mg  50 mg Oral Daily [name], [name], MD   50 mg at 02/25/20 0807  . oxybutynin (DITROPAN) tablet 10 mg  10 mg Oral BID [name], [name], MD   10 mg at 02/25/20 0807  . pantoprazole (PROTONIX) EC tablet 40 mg  40 mg Oral Daily [name], [name], MD   40 mg at 02/25/20 0929  . QUEtiapine (SEROQUEL) tablet 100 mg  100 mg Oral QID [name], [name], MD   100 mg at 02/25/20 1135  . QUEtiapine (SEROQUEL) tablet 400 mg  400 mg Oral QHS [name], NP   400 mg at 02/24/20 2112  . sertraline (ZOLOFT) tablet 100 mg  100 mg Oral BID [name], [name], MD   100 mg at 02/25/20 0806  . simvastatin (ZOCOR) tablet 20 mg  20 mg Oral q1800 [name], [name], MD   20 mg at 02/24/20 1700     Discharge Medications: Please see discharge summary for a list of discharge medications.  Relevant Imaging Results:  Relevant Lab Results:   Additional Information    [name], LCSW

## 2020-02-25 NOTE — Progress Notes (Signed)
Patient alert and oriented x 4, affect is blunted thoughts are organized he appears anxious and apprehensive, he was given emotional support and encouraged to attend evening wrap up group. Patient attended evening group he was appropriate with peers and staff, he was also complaint with medication regimen. Patient currently denies SI/HI/AVH no distress noted will continue to closely monitor.

## 2020-02-25 NOTE — Plan of Care (Signed)
Pt rates depression 2/10 and anxiety 10/10. Pt denies SI, Hi and AVH. Pt was educated on care plan and verbalizes understanding. Pt was encouraged to attend groups. Collier Bullock RN Problem: Education: Goal: Knowledge of [facility] Education information/materials will improve Outcome: Progressing Goal: Emotional status will improve Outcome: Progressing Goal: Mental status will improve Outcome: Progressing Goal: Verbalization of understanding the information provided will improve Outcome: Progressing   Problem: Activity: Goal: Interest or engagement in activities will improve Outcome: Progressing Goal: Sleeping patterns will improve Outcome: Progressing   Problem: Health Behavior/Discharge Planning: Goal: Identification of resources available to assist in meeting health care needs will improve Outcome: Progressing Goal: Compliance with treatment plan for underlying cause of condition will improve Outcome: Progressing   Problem: Physical Regulation: Goal: Ability to maintain clinical measurements within normal limits will improve Outcome: Progressing   Problem: Safety: Goal: Periods of time without injury will increase Outcome: Progressing   Problem: Education: Goal: Ability to make informed decisions regarding treatment will improve Outcome: Progressing   Problem: Coping: Goal: Coping ability will improve Outcome: Progressing   Problem: Health Behavior/Discharge Planning: Goal: Identification of resources available to assist in meeting health care needs will improve Outcome: Progressing   Problem: Medication: Goal: Compliance with prescribed medication regimen will improve Outcome: Progressing   Problem: Self-Concept: Goal: Ability to disclose and discuss suicidal ideas will improve Outcome: Progressing Goal: Will verbalize positive feelings about self Outcome: Progressing

## 2020-02-25 NOTE — Progress Notes (Signed)
[facility] MD Progress Note  02/25/2020 4:09 PM Alexander Beard  MRN:  [id number] Subjective: Follow-up for this patient with schizoaffective disorder.  Patient has no new complaints today.  He has been taking care of his hygiene adequately.  He comes to groups and interact with others but also uses his time by himself adequately.  Feeling much less anxious.  No suicidal thoughts.  Blood sugars have been quite stable all in the range near 100. Principal Problem: Schizoaffective disorder, bipolar type ([place]) Diagnosis: Principal Problem:   Schizoaffective disorder, bipolar type ([place]) Active Problems:   Active autistic disorder   Diabetes ([place])   Hypertension   Prostate hypertrophy  Total Time spent with patient: 30 minutes  Past Psychiatric History: Past history of longstanding chronic impairment  Past Medical History:  Past Medical History:  Diagnosis Date  . Anxiety   . Anxiety disorder due to known physiological condition    HOSPITALIZED 10/18  . Arthritis   . Autistic disorder, residual state   . COPD (chronic obstructive pulmonary disease) ([place])   . Depression   . Developmental disorder   . Diabetes mellitus without complication ([place])   . Dyslipidemia   . Esophageal reflux   . HOH (hard of hearing)    MILDLY  . Hypertension   . Obesity   . Overactive bladder   . Palpitations    ANXIETY  . Schizophrenia, schizoaffective ([place])   . Sleep apnea   . Tremors of nervous system    HANDS DUE TO MEDICATIONS  . Urinary incontinence     Past Surgical History:  Procedure Laterality Date  . CATARACT EXTRACTION W/PHACO Left 11/14/2017   Procedure: CATARACT EXTRACTION PHACO AND INTRAOCULAR LENS PLACEMENT (IOC);  Surgeon: Birder Robson, MD;  Location: [facility] ORS;  Service: Ophthalmology;  Laterality: Left;  [place] 00:42 AP% 14.6 CDE 6.12 Fluid pack lot # [id number]  . COLONOSCOPY  01/28/2005  . COLONOSCOPY WITH PROPOFOL N/A 05/09/2016   Procedure: COLONOSCOPY WITH PROPOFOL;  Surgeon: Manya Silvas, MD;  Location: [facility] ENDOSCOPY;  Service: Endoscopy;  Laterality: N/A;  . ESOPHAGOGASTRODUODENOSCOPY N/A 05/09/2016   Procedure: ESOPHAGOGASTRODUODENOSCOPY (EGD);  Surgeon: Manya Silvas, MD;  Location: [facility] ENDOSCOPY;  Service: Endoscopy;  Laterality: N/A;  . FRACTURE SURGERY     ORIF SHOULDER  . TONSILLECTOMY     Family History:  Family History  Problem Relation Age of Onset  . Hypertension Mother   . Stroke Father   . Heart Problems Father    Family Psychiatric  History: See previous Social History:  Social History   Substance and Sexual Activity  Alcohol Use No  . Alcohol/week: 0.0 standard drinks     Social History   Substance and Sexual Activity  Drug Use No    Social History   Socioeconomic History  . Marital status: Single    Spouse name: Not on file  . Number of children: Not on file  . Years of education: Not on file  . Highest education level: Not on file  Occupational History  . Not on file  Tobacco Use  . Smoking status: Never Smoker  . Smokeless tobacco: Never Used  Vaping Use  . Vaping Use: Never used  Substance and Sexual Activity  . Alcohol use: No    Alcohol/week: 0.0 standard drinks  . Drug use: No  . Sexual activity: Never  Other Topics Concern  . Not on file  Social History Narrative   The patient never finished high school but did get  his GED. 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 He is currently in disability and [name] is his legal guardian. He has been living in group homes for many years.      No pending legal charges   Social Determinants of Health   Financial Resource Strain:   . Difficulty of Paying Living Expenses:   Food Insecurity:   . Worried About Charity fundraiser in the Last Year:   . Arboriculturist in the Last Year:   Transportation Needs:   . Film/video editor (Medical):   Marland Kitchen Lack of Transportation (Non-Medical):    Physical Activity:   . Days of Exercise per Week:   . Minutes of Exercise per Session:   Stress:   . Feeling of Stress :   Social Connections:   . Frequency of Communication with Friends and Family:   . Frequency of Social Gatherings with Friends and Family:   . Attends Religious Services:   . Active Member of Clubs or Organizations:   . Attends Archivist Meetings:   Marland Kitchen Marital Status:    Additional Social History:                         Sleep: Fair  Appetite:  Fair  Current Medications: Current Facility-Administered Medications  Medication Dose Route Frequency Provider Last Rate Last Admin  . acetaminophen (TYLENOL) tablet 650 mg  650 mg Oral Q6H PRN [name], MD      . alum & mag hydroxide-simeth (MAALOX/MYLANTA) 200-200-20 MG/5ML suspension 30 mL  30 mL Oral Q4H PRN [name], MD      . aspirin EC tablet 81 mg  81 mg Oral Daily Daegen Berrocal, [name], MD   81 mg at 02/25/20 0804  . clonazePAM (KLONOPIN) tablet 1 mg  1 mg Oral QID Xiong Haidar, [name], MD   1 mg at 02/25/20 1135  . docusate sodium (COLACE) capsule 100 mg  100 mg Oral BID Myrah Strawderman, [name], MD   100 mg at 02/25/20 0806  . gabapentin (NEURONTIN) capsule 100 mg  100 mg Oral TID Kamilo Och T, MD   100 mg at 02/25/20 1135  . hydrOXYzine (ATARAX/VISTARIL) tablet 10 mg  10 mg Oral TID PRN [name], MD   10 mg at 02/20/20 2127  . lamoTRIgine (LAMICTAL) tablet 100 mg  100 mg Oral QHS Abdulhamid Olgin, [name], MD   100 mg at 02/24/20 2112  . linagliptin (TRADJENTA) tablet 5 mg  5 mg Oral Daily Ordean Fouts, [name], MD   5 mg at 02/25/20 0807  . loratadine (CLARITIN) tablet 10 mg  10 mg Oral Daily Arriana Lohmann, [name], MD   10 mg at 02/25/20 0806  . magnesium hydroxide (MILK OF MAGNESIA) suspension 30 mL  30 mL Oral Daily PRN [name], MD      . metFORMIN (GLUCOPHAGE) tablet 850 mg  850 mg Oral BID WC Cheyna Retana, [name], MD   850 mg at 02/25/20 0804  . metoprolol succinate (TOPROL-XL) 24 hr tablet 50 mg   50 mg Oral Daily Shaneisha Burkel, [name], MD   50 mg at 02/25/20 0807  . oxybutynin (DITROPAN) tablet 10 mg  10 mg Oral BID Byrd Terrero, [name], MD   10 mg at 02/25/20 0807  . pantoprazole (PROTONIX) EC tablet 40 mg  40 mg Oral Daily Virdia Ziesmer, [name], MD  40 mg at 02/25/20 0808  . QUEtiapine (SEROQUEL) tablet 100 mg  100 mg Oral QID Akera Snowberger T, MD   100 mg at 02/25/20 1135  . QUEtiapine (SEROQUEL) tablet 400 mg  400 mg Oral QHS [name], NP   400 mg at 02/24/20 2112  . sertraline (ZOLOFT) tablet 100 mg  100 mg Oral BID Kaegan Hettich, [name], MD   100 mg at 02/25/20 0806  . simvastatin (ZOCOR) tablet 20 mg  20 mg Oral q1800 Shanard Treto, [name], MD   20 mg at 02/24/20 1700    Lab Results:  Results for orders placed or performed during the hospital encounter of 02/19/20 (from the past 48 hour(s))  Glucose, capillary     Status: None   Collection Time: 02/23/20  4:12 PM  Result Value Ref Range   Glucose-Capillary 71 70 - 99 mg/dL    Comment: Glucose reference range applies only to samples taken after fasting for at least 8 hours.  Glucose, capillary     Status: Abnormal   Collection Time: 02/23/20  9:50 PM  Result Value Ref Range   Glucose-Capillary 172 (H) 70 - 99 mg/dL    Comment: Glucose reference range applies only to samples taken after fasting for at least 8 hours.  Glucose, capillary     Status: Abnormal   Collection Time: 02/24/20  7:03 AM  Result Value Ref Range   Glucose-Capillary 121 (H) 70 - 99 mg/dL    Comment: Glucose reference range applies only to samples taken after fasting for at least 8 hours.  Glucose, capillary     Status: Abnormal   Collection Time: 02/24/20 11:18 AM  Result Value Ref Range   Glucose-Capillary 101 (H) 70 - 99 mg/dL    Comment: Glucose reference range applies only to samples taken after fasting for at least 8 hours.   Comment 1 Notify RN   Glucose, capillary     Status: Abnormal   Collection Time: 02/24/20  4:02 PM  Result Value Ref Range    Glucose-Capillary 103 (H) 70 - 99 mg/dL    Comment: Glucose reference range applies only to samples taken after fasting for at least 8 hours.   Comment 1 Notify RN   Urinalysis, Complete w Microscopic     Status: Abnormal   Collection Time: 02/24/20  5:20 PM  Result Value Ref Range   Color, Urine YELLOW (A) YELLOW   APPearance HAZY (A) CLEAR   Specific Gravity, Urine 1.016 1.005 - 1.030   pH 5.0 5.0 - 8.0   Glucose, UA NEGATIVE NEGATIVE mg/dL   Hgb urine dipstick NEGATIVE NEGATIVE   Bilirubin Urine NEGATIVE NEGATIVE   Ketones, ur NEGATIVE NEGATIVE mg/dL   Protein, ur NEGATIVE NEGATIVE mg/dL   Nitrite NEGATIVE NEGATIVE   Leukocytes,Ua NEGATIVE NEGATIVE   RBC / HPF 0-5 0 - 5 RBC/hpf   WBC, UA 0-5 0 - 5 WBC/hpf   Bacteria, UA NONE SEEN NONE SEEN   Squamous Epithelial / LPF 0-5 0 - 5   Mucus PRESENT     Comment: Performed at [facility], [address] [place], [place] [address]  Glucose, capillary     Status: None   Collection Time: 02/24/20  8:47 PM  Result Value Ref Range   Glucose-Capillary 98 70 - 99 mg/dL    Comment: Glucose reference range applies only to samples taken after fasting for at least 8 hours.   Comment 1 Notify RN   Glucose, capillary  Status: None   Collection Time: 02/25/20  6:56 AM  Result Value Ref Range   Glucose-Capillary 94 70 - 99 mg/dL    Comment: Glucose reference range applies only to samples taken after fasting for at least 8 hours.  Glucose, capillary     Status: Abnormal   Collection Time: 02/25/20 11:18 AM  Result Value Ref Range   Glucose-Capillary 126 (H) 70 - 99 mg/dL    Comment: Glucose reference range applies only to samples taken after fasting for at least 8 hours.    Blood Alcohol level:  Lab Results  Component Value Date   ETH <10 01/22/2020   ETH <10 03/55/9741    Metabolic Disorder Labs: Lab Results  Component Value Date   HGBA1C 6.6 (H) 02/20/2020   MPG 142.72 02/20/2020   No results found for:  PROLACTIN Lab Results  Component Value Date   CHOL 153 04/10/2015   TRIG 166 (H) 04/10/2015   HDL 50 04/10/2015   CHOLHDL 3.1 04/10/2015   VLDL 33 04/10/2015   LDLCALC 70 04/10/2015    Physical Findings: AIMS:  , ,  ,  ,    CIWA:    COWS:     Musculoskeletal: Strength & Muscle Tone: within normal limits Gait & Station: normal Patient leans: N/A  Psychiatric Specialty Exam: Physical Exam Vitals and nursing note reviewed.  Constitutional:      Appearance: He is well-developed.  HENT:     Head: Normocephalic and atraumatic.  Eyes:     Conjunctiva/sclera: Conjunctivae normal.     Pupils: Pupils are equal, round, and reactive to light.  Cardiovascular:     Heart sounds: Normal heart sounds.  Pulmonary:     Effort: Pulmonary effort is normal.  Abdominal:     Palpations: Abdomen is soft.  Musculoskeletal:        General: Normal range of motion.     Cervical back: Normal range of motion.  Skin:    General: Skin is warm and dry.  Neurological:     General: No focal deficit present.     Mental Status: He is alert.  Psychiatric:        Attention and Perception: He is inattentive.        Mood and Affect: Mood is anxious.        Speech: Speech is tangential.        Behavior: Behavior is not agitated or aggressive.        Thought Content: Thought content is not paranoid. Thought content does not include homicidal or suicidal ideation.        Cognition and Memory: Cognition is impaired.        Judgment: Judgment is impulsive.     Review of Systems  Constitutional: Negative.   HENT: Negative.   Eyes: Negative.   Respiratory: Negative.   Cardiovascular: Negative.   Gastrointestinal: Negative.   Musculoskeletal: Negative.   Skin: Negative.   Neurological: Negative.   Psychiatric/Behavioral: Negative.     Blood pressure 138/76, pulse (!) 103, temperature 98 F (36.7 C), temperature source Oral, resp. rate 17, height 5\' 8"  (1.727 m), weight 104.3 kg, SpO2 100 %.Body  mass index is 34.96 kg/m.  General Appearance: Casual  Eye Contact:  Good  Speech:  Clear and Coherent  Volume:  Normal  Mood:  Euthymic  Affect:  Congruent  Thought Process:  Goal Directed  Orientation:  Full (Time, Place, and Person)  Thought Content:  Logical  Suicidal Thoughts:  No  Homicidal  Thoughts:  No  Memory:  Immediate;   Fair Recent;   Fair Remote;   Fair  Judgement:  Fair  Insight:  Fair  Psychomotor Activity:  Normal  Concentration:  Concentration: Fair  Recall:  [employer] of Knowledge:  Fair  Language:  Fair  Akathisia:  No  Handed:  Right  AIMS (if indicated):     Assets:  Desire for Improvement  ADL's:  Impaired  Cognition:  Impaired,  Mild  Sleep:  Number of Hours: 6.75     Treatment Plan Summary: Daily contact with patient to assess and evaluate symptoms and progress in treatment, Medication management and Plan Urinalysis does not show infection.  If he continues to have trouble with enuresis we may have to ask urology opinion.  He is running normal blood sugars so I am going to discontinue the blood sugar checks.  No change to psychiatric medicine today  Alethia Berthold, MD 02/25/2020, 4:09 PM

## 2020-02-25 NOTE — BHH Counselor (Signed)
CSW updated the patient's brother that at this time a "dementia screening" can not be completed due to lack of access to a psychologist.   CSW advised the patient's brother to contact the patient's insurance provider to see who provides the service in the area and if the service is covered by insurance.  Assunta Curtis, MSW, LCSW 02/25/2020 3:03 PM

## 2020-02-25 NOTE — BHH Counselor (Signed)
CSw spoke with the patient's brother who requested that an FL2 be completed.  He also requested that the doctor complete a dementia screening on the patient.    CSW informed that she would inform psychiatrist of the request.  CSW sent a secured chat.    CSW has completed the [facility] and waiting for psychiatrist to review and cosign.  Assunta Curtis, MSW, LCSW 02/25/2020 12:00 PM

## 2020-02-25 NOTE — Plan of Care (Signed)
  Problem: Education: Goal: Knowledge of [facility] Education information/materials will improve Outcome: Progressing Goal: Emotional status will improve Outcome: Progressing Goal: Mental status will improve Outcome: Progressing Goal: Verbalization of understanding the information provided will improve Outcome: Progressing   Problem: Activity: Goal: Interest or engagement in activities will improve Outcome: Progressing Goal: Sleeping patterns will improve Outcome: Progressing   Problem: Coping: Goal: Ability to verbalize frustrations and anger appropriately will improve Outcome: Progressing Goal: Ability to demonstrate self-control will improve Outcome: Progressing   Problem: Health Behavior/Discharge Planning: Goal: Identification of resources available to assist in meeting health care needs will improve Outcome: Progressing Goal: Compliance with treatment plan for underlying cause of condition will improve Outcome: Progressing   Problem: Physical Regulation: Goal: Ability to maintain clinical measurements within normal limits will improve Outcome: Progressing   Problem: Safety: Goal: Periods of time without injury will increase Outcome: Progressing   Problem: Education: Goal: Ability to make informed decisions regarding treatment will improve Outcome: Progressing   Problem: Coping: Goal: Coping ability will improve Outcome: Progressing   Problem: Health Behavior/Discharge Planning: Goal: Identification of resources available to assist in meeting health care needs will improve Outcome: Progressing   Problem: Medication: Goal: Compliance with prescribed medication regimen will improve Outcome: Progressing   Problem: Self-Concept: Goal: Ability to disclose and discuss suicidal ideas will improve Outcome: Progressing Goal: Will verbalize positive feelings about self Outcome: Progressing   

## 2020-02-25 NOTE — Progress Notes (Signed)
Recreation Therapy Notes   Date: 02/25/2020  Time: 9:30 am  Location: Craft room   Behavioral response: Appropriate  Intervention Topic: Necessities   Discussion/Intervention:  Group content on today was focused on necessities. The group defined necessities and how they determine their necessities. Individuals expressed how many necessities they have and if it changes from day to day. Patients described the difference between wants and needs. The group explained how they have overspent on wants in the past. Individuals described a reoccurring necessity for them. The intervention "What I need" helped patients differentiate between wants and needs.  Clinical Observations/Feedback:  Patient came to group and defined necessities as positive help and reinforcement . He explained that it is necessary for him to participate in his hobbies of coloring and drawing. Individual was social with peers and staff while participating in the intervention. Jaelin Devincentis LRT/CTRS          Troyce Gieske 02/25/2020 11:11 AM

## 2020-02-25 NOTE — Progress Notes (Signed)
D- Patient alert and oriented. Affect/mood is pleasant and calm. Pt denies SI, HI, AVH, and pain. Pt stated that he has felt so much better today. He has went to groups and did art work in his room this afternnoon.Marland Kitchen   A- Scheduled medications administered to patient, per MD orders. Support and encouragement provided.  Routine safety checks conducted every 15 minutes.  Patient informed to notify staff with problems or concerns.  R- No adverse drug reactions n oted. Patient contracts for safety at this time. Patient compliant with medications and treatment plan. Patient receptive, calm, and cooperative. Patient interacts well with others on the unit.  Patient remains safe at this time.  Collier Bullock RN

## 2020-02-25 NOTE — Progress Notes (Signed)
°   02/25/20 1400  Clinical Encounter Type  Visited With Patient  Visit Type Initial;Spiritual support;Social support;Behavioral Health  Referral From Chaplain  Consult/Referral To Chaplain  Pt attended Ch group today. The group focus was on anxiety. Pt participated in group. Ch will follow-up with Pt.

## 2020-02-25 NOTE — Tx Team (Signed)
Interdisciplinary Treatment and Diagnostic Plan Update  02/25/2020 Time of Session: 8:30AM Alexander Beard MRN: [id number]  Principal Diagnosis: Schizoaffective disorder, bipolar type [facility])  Secondary Diagnoses: Principal Problem:   Schizoaffective disorder, bipolar type ([place]) Active Problems:   Active autistic disorder   Diabetes ([facility])   Hypertension   Prostate hypertrophy   Current Medications:  Current Facility-Administered Medications  Medication Dose Route Frequency Provider Last Rate Last Admin  . acetaminophen (TYLENOL) tablet 650 mg  650 mg Oral Q6H PRN [name], MD      . alum & mag hydroxide-simeth (MAALOX/MYLANTA) 200-200-20 MG/5ML suspension 30 mL  30 mL Oral Q4H PRN [name], MD      . aspirin EC tablet 81 mg  81 mg Oral Daily [name], [name], MD   81 mg at 02/25/20 0804  . clonazePAM (KLONOPIN) tablet 1 mg  1 mg Oral QID [name], [name], MD   1 mg at 02/25/20 1135  . docusate sodium (COLACE) capsule 100 mg  100 mg Oral BID [name], [name], MD   100 mg at 02/25/20 0806  . gabapentin (NEURONTIN) capsule 100 mg  100 mg Oral TID [name], [name], MD   100 mg at 02/25/20 1135  . hydrOXYzine (ATARAX/VISTARIL) tablet 10 mg  10 mg Oral TID PRN [name], MD   10 mg at 02/20/20 2127  . insulin aspart (novoLOG) injection 0-5 Units  0-5 Units Subcutaneous QHS [name], [name], MD      . insulin aspart (novoLOG) injection 0-9 Units  0-9 Units Subcutaneous TID WC [name], [name], MD   1 Units at 02/25/20 1134  . lamoTRIgine (LAMICTAL) tablet 100 mg  100 mg Oral QHS [name], [name], MD   100 mg at 02/24/20 2112  . linagliptin (TRADJENTA) tablet 5 mg  5 mg Oral Daily [name], [name], MD   5 mg at 02/25/20 0807  . loratadine (CLARITIN) tablet 10 mg  10 mg Oral Daily [name], [name], MD   10 mg at 02/25/20 0806  . magnesium hydroxide (MILK OF MAGNESIA) suspension 30 mL  30 mL Oral Daily PRN [name], MD      . metFORMIN (GLUCOPHAGE) tablet 850 mg  850 mg Oral  BID WC [name], [name], MD   850 mg at 02/25/20 0804  . metoprolol succinate (TOPROL-XL) 24 hr tablet 50 mg  50 mg Oral Daily [name], [name], MD   50 mg at 02/25/20 0807  . oxybutynin (DITROPAN) tablet 10 mg  10 mg Oral BID [name], [name], MD   10 mg at 02/25/20 0807  . pantoprazole (PROTONIX) EC tablet 40 mg  40 mg Oral Daily [name], [name], MD   40 mg at 02/25/20 1517  . QUEtiapine (SEROQUEL) tablet 100 mg  100 mg Oral QID [name], [name], MD   100 mg at 02/25/20 1135  . QUEtiapine (SEROQUEL) tablet 400 mg  400 mg Oral QHS [name], NP   400 mg at 02/24/20 2112  . sertraline (ZOLOFT) tablet 100 mg  100 mg Oral BID [name], [name], MD   100 mg at 02/25/20 0806  . simvastatin (ZOCOR) tablet 20 mg  20 mg Oral q1800 [name], [name], MD   20 mg at 02/24/20 1700   PTA Medications: Medications Prior to Admission  Medication Sig Dispense Refill Last Dose  . acetaminophen (TYLENOL) 650 MG CR tablet Take 650 mg by mouth every 12 (twelve) hours as needed for pain.     Marland Kitchen  aspirin EC 81 MG tablet Take 81 mg by mouth daily.     . Calcium Carbonate-Vitamin D3 (CALCIUM 600-D) 600-400 MG-UNIT TABS Take 1 tablet by mouth 2 (two) times daily.     . Cholecalciferol (VITAMIN D-1000 MAX ST) 1000 UNITS tablet Take 1,000 Units by mouth daily.      Marland Kitchen docusate sodium (COLACE) 100 MG capsule Take 100 mg by mouth daily.      . furosemide (LASIX) 20 MG tablet Take 20 mg by mouth daily.      Marland Kitchen gabapentin (NEURONTIN) 100 MG capsule Take 1 capsule (100 mg total) by mouth 3 (three) times daily. 90 capsule 10   . Insulin Detemir (LEVEMIR FLEXTOUCH) 100 UNIT/ML Pen Inject 15 Units into the skin daily at 10 pm.     . lamoTRIgine (LAMICTAL) 100 MG tablet Take 1 tablet (100 mg total) by mouth at bedtime. 30 tablet 10   . loratadine (CLARITIN) 10 MG tablet Take 1 tablet (10 mg total) by mouth daily. 30 tablet 5   . LORazepam (ATIVAN) 1 MG tablet Take 1 tablet (1 mg total) by mouth 3 (three) times daily. 90 tablet 5    . lurasidone (LATUDA) 40 MG TABS tablet Take 1 tablet (40 mg total) by mouth daily. with food 30 tablet 10   . metoprolol succinate (TOPROL-XL) 50 MG 24 hr tablet Take 50 mg by mouth daily.      . NON FORMULARY cpap device     . oxybutynin (DITROPAN) 5 MG tablet Take 5 mg by mouth daily.     . pantoprazole (PROTONIX) 40 MG tablet Take 40 mg by mouth daily.     . polyethylene glycol (MIRALAX / GLYCOLAX) packet Take 17 g by mouth daily.     . QUEtiapine (SEROQUEL) 100 MG tablet TAKE 1 TABLET  BY MOUTH THREE TIMES A DAY (Patient taking differently: Take 100 mg by mouth 3 (three) times daily. TAKE 1 TABLET  BY MOUTH THREE TIMES A DAY) 90 tablet 11   . QUEtiapine (SEROQUEL) 400 MG tablet Take 1 tablet (400 mg total) by mouth at bedtime. 30 tablet 10   . sertraline (ZOLOFT) 100 MG tablet Take 2 tablets (200 mg total) by mouth daily. (Patient taking differently: Take 200 mg by mouth daily. Take along with two 50 mg tablets (100 mg) for total 300 mg daily) 60 tablet 5   . sertraline (ZOLOFT) 50 MG tablet Take 2 tablets (100 mg total) by mouth daily. (Patient taking differently: Take 100 mg by mouth daily. Take along with two 100 mg tablets (200 mg) for total 300 mg daily) 60 tablet 5   . simethicone (MYLICON) 80 MG chewable tablet Chew 80-160 mg by mouth 2 (two) times daily as needed for flatulence.     . simvastatin (ZOCOR) 20 MG tablet Take 20 mg by mouth at bedtime.      . sitaGLIPtin (JANUVIA) 100 MG tablet Take 100 mg by mouth daily.     . solifenacin (VESICARE) 5 MG tablet Take 5 mg by mouth daily.     . Starch (HEMORRHOIDAL RE) Place 1 application rectally 4 (four) times daily as needed (for itching).     . tamsulosin (FLOMAX) 0.4 MG CAPS capsule Take 0.4 mg by mouth daily.       Patient Stressors: Health problems Marital or family conflict Medication change or noncompliance Traumatic event  Patient Strengths: Motivation for treatment/growth Religious Affiliation  Treatment Modalities:  Medication Management, Group therapy, Case management,  1  to 1 session with clinician, Psychoeducation, Recreational therapy.   Physician Treatment Plan for Primary Diagnosis: Schizoaffective disorder, bipolar type ([place]) Long Term Goal(s): Improvement in symptoms so as ready for discharge Improvement in symptoms so as ready for discharge   Short Term Goals: Ability to verbalize feelings will improve Ability to demonstrate self-control will improve Ability to maintain clinical measurements within normal limits will improve Compliance with prescribed medications will improve  Medication Management: Evaluate patient's response, side effects, and tolerance of medication regimen.  Therapeutic Interventions: 1 to 1 sessions, Unit Group sessions and Medication administration.  Evaluation of Outcomes: Progressing  Physician Treatment Plan for Secondary Diagnosis: Principal Problem:   Schizoaffective disorder, bipolar type ([place]) Active Problems:   Active autistic disorder   Diabetes ([place])   Hypertension   Prostate hypertrophy  Long Term Goal(s): Improvement in symptoms so as ready for discharge Improvement in symptoms so as ready for discharge   Short Term Goals: Ability to verbalize feelings will improve Ability to demonstrate self-control will improve Ability to maintain clinical measurements within normal limits will improve Compliance with prescribed medications will improve     Medication Management: Evaluate patient's response, side effects, and tolerance of medication regimen.  Therapeutic Interventions: 1 to 1 sessions, Unit Group sessions and Medication administration.  Evaluation of Outcomes: Progressing   RN Treatment Plan for Primary Diagnosis: Schizoaffective disorder, bipolar type ([place]) Long Term Goal(s): Knowledge of disease and therapeutic regimen to maintain health will improve  Short Term Goals: Ability to demonstrate self-control, Ability to participate in  decision making will improve, Ability to verbalize feelings will improve, Ability to disclose and discuss suicidal ideas, Ability to identify and develop effective coping behaviors will improve and Compliance with prescribed medications will improve  Medication Management: RN will administer medications as ordered by provider, will assess and evaluate patient's response and provide education to patient for prescribed medication. RN will report any adverse and/or side effects to prescribing provider.  Therapeutic Interventions: 1 on 1 counseling sessions, Psychoeducation, Medication administration, Evaluate responses to treatment, Monitor vital signs and CBGs as ordered, Perform/monitor CIWA, COWS, AIMS and Fall Risk screenings as ordered, Perform wound care treatments as ordered.  Evaluation of Outcomes: Progressing   LCSW Treatment Plan for Primary Diagnosis: Schizoaffective disorder, bipolar type ([place]) Long Term Goal(s): Safe transition to appropriate next level of care at discharge, Engage patient in therapeutic group addressing interpersonal concerns.  Short Term Goals: Engage patient in aftercare planning with referrals and resources, Increase social support, Increase ability to appropriately verbalize feelings, Increase emotional regulation and Increase skills for wellness and recovery  Therapeutic Interventions: Assess for all discharge needs, 1 to 1 time with Social worker, Explore available resources and support systems, Assess for adequacy in community support network, Educate family and significant other(s) on suicide prevention, Complete Psychosocial Assessment, Interpersonal group therapy.  Evaluation of Outcomes: Progressing   Progress in Treatment: Attending groups: Yes. Participating in groups: Yes. Taking medication as prescribed: Yes. Toleration medication: Yes. Family/Significant other contact made: Yes, individual(s) contacted:  once permission will be given.  Patient  understands diagnosis: Yes. Discussing patient identified problems/goals with staff: Yes. Medical problems stabilized or resolved: Yes. Denies suicidal/homicidal ideation: Yes. Issues/concerns per patient self-inventory: No. Other: none  New problem(s) identified: No, Describe:  none  New Short Term/Long Term Goal(s):  , elimination of symptoms of psychosis, medication management for mood stabilization; elimination of SI thoughts; development of comprehensive mental wellness/sobriety plan.  Update 02/25/2020: No changes at this time.  Patient Goals:  "i'm thinking all the time about my future"  Update 02/25/2020: No changes at this time.   Discharge Plan or Barriers: CSW will assist the patient in developing appropriate aftercare plans.  Update 02/25/2020: No changes at this time.   Reason for Continuation of Hospitalization: Anxiety Depression Medication stabilization Suicidal ideation  Estimated Length of Stay:  1-7 days   Recreational Therapy: Patient: N/A Patient Goal: Patient will engage in groups without prompting or encouragement from LRT x3 group sessions within 5 recreation therapy group sessions  Attendees: Patient:   02/25/2020 1:33 PM  Physician: [name], MD 02/25/2020 1:33 PM  Nursing:  02/25/2020 1:33 PM  RN Care Manager: 02/25/2020 1:33 PM  Social Worker: Assunta Curtis, [place] 02/25/2020 1:33 PM  Recreational Therapist:  02/25/2020 1:33 PM  Other:  02/25/2020 1:33 PM  Other:  02/25/2020 1:33 PM  Other: 02/25/2020 1:33 PM    Scribe for Treatment Team: [name], LCSW 02/25/2020 1:33 PM

## 2020-02-26 NOTE — Progress Notes (Signed)
Patient is alert and oriented x 4. He continues to have anxiety but the tremors have greatly reduced. He is active on the unit and engages well with others and enjoys spending his time drawing and coloring. He denies SI/HI/AVH depression and pain. He is med compliant and tolerated his meds without incident. He is safe on the unit with 15 minute safety checks and informed to contact staff any concerns.   Cleo Butler-Nicholson, LPN

## 2020-02-26 NOTE — Progress Notes (Signed)
Patient is alert and oriented X4. He is pleasant and eager to engage. He denies SI/HI/AH/VH/depression and pain at this encounter.  He does endorse anxiety to which he rates a 2 on 0-10 scale and states that he is always anxious.  He received his prescribed meds and tolerated without incident. He has been active on the unit interacting well with others drawing and coloring. He remains safe on the unit with 15 minute safety checks and informed to contact staff with any concerns.   Cleo Butler-Nicholson, LPN

## 2020-02-26 NOTE — Progress Notes (Signed)
Recreation Therapy Notes   Date: 02/26/2020  Time: 9:30 am  Location: Craft room   Behavioral response: Appropriate  Intervention Topic: Goals    Discussion/Intervention:  Group content on today was focused on goals. Patients described what goals are and how they define goals. Individuals expressed how they go about setting goals and reaching them. The group identified how important goals are and if they make short term goals to reach long term goals. Patients described how many goals they work on at a time and what affects them not reaching their goal. Individuals described how much time they put into planning and obtaining their goals. The group participated in the intervention "My Goal Board" and made personal goal boards to help them achieve their goal. Clinical Observations/Feedback:  Patient came to group and defined goals as recognition, honor and ambition. Participant explained that his goal is to cope and deal with life while getting back to the things he loved. Individual was social with peers and staff while participating in the intervention. Alexander Beard LRT/CTRS         Alexander Beard 02/26/2020 11:55 AM

## 2020-02-26 NOTE — Plan of Care (Signed)
  Problem: Education: Goal: Knowledge of [facility] Education information/materials will improve 02/26/2020 0021 by Butler-Nicholson, Tyera Hansley L, LPN Outcome: Progressing 02/25/2020 2346 by Butler-Nicholson, Lanissa Cashen L, LPN Outcome: Progressing Goal: Emotional status will improve 02/26/2020 0021 by Butler-Nicholson, Perry Molla L, LPN Outcome: Progressing 02/25/2020 2346 by Butler-Nicholson, Orvetta Danielski L, LPN Outcome: Progressing Goal: Mental status will improve 02/26/2020 0021 by Butler-Nicholson, Maressa Apollo L, LPN Outcome: Progressing 02/25/2020 2346 by Butler-Nicholson, Hajer Dwyer L, LPN Outcome: Progressing Goal: Verbalization of understanding the information provided will improve 02/26/2020 0021 by Butler-Nicholson, Kayonna Lawniczak L, LPN Outcome: Progressing 02/25/2020 2346 by Butler-Nicholson, Yeison Sippel L, LPN Outcome: Progressing   Problem: Activity: Goal: Interest or engagement in activities will improve 02/26/2020 0021 by Butler-Nicholson, Doral Digangi L, LPN Outcome: Progressing 02/25/2020 2346 by Butler-Nicholson, Louann Hopson L, LPN Outcome: Progressing Goal: Sleeping patterns will improve 02/26/2020 0021 by Butler-Nicholson, Lars Jeziorski L, LPN Outcome: Progressing 02/25/2020 2346 by Butler-Nicholson, Chyrl Elwell L, LPN Outcome: Progressing   Problem: Coping: Goal: Ability to verbalize frustrations and anger appropriately will improve 02/26/2020 0021 by Butler-Nicholson, Merrin Mcvicker L, LPN Outcome: Progressing 02/25/2020 2346 by Butler-Nicholson, Ferrin Liebig L, LPN Outcome: Progressing Goal: Ability to demonstrate self-control will improve 02/26/2020 0021 by Butler-Nicholson, Judyth Demarais L, LPN Outcome: Progressing 02/25/2020 2346 by Butler-Nicholson, Joselito Fieldhouse L, LPN Outcome: Progressing   Problem: Health Behavior/Discharge Planning: Goal: Identification of resources available to assist in meeting health care needs will improve 02/26/2020 0021 by Butler-Nicholson, Armida Vickroy L,  LPN Outcome: Progressing 02/25/2020 2346 by Butler-Nicholson, Farren Landa L, LPN Outcome: Progressing Goal: Compliance with treatment plan for underlying cause of condition will improve 02/26/2020 0021 by Butler-Nicholson, Devra Stare L, LPN Outcome: Progressing 02/25/2020 2346 by Butler-Nicholson, Miakoda Mcmillion L, LPN Outcome: Progressing   Problem: Physical Regulation: Goal: Ability to maintain clinical measurements within normal limits will improve 02/26/2020 0021 by Butler-Nicholson, Jonas Goh L, LPN Outcome: Progressing 02/25/2020 2346 by Butler-Nicholson, Benigna Delisi L, LPN Outcome: Progressing   Problem: Safety: Goal: Periods of time without injury will increase 02/26/2020 0021 by Butler-Nicholson, Ashleyann Shoun L, LPN Outcome: Progressing 02/25/2020 2346 by Butler-Nicholson, Kirston Luty L, LPN Outcome: Progressing   Problem: Education: Goal: Ability to make informed decisions regarding treatment will improve 02/26/2020 0021 by Butler-Nicholson, Malick Netz L, LPN Outcome: Progressing 02/25/2020 2346 by Butler-Nicholson, Tadan Shill L, LPN Outcome: Progressing   Problem: Coping: Goal: Coping ability will improve 02/26/2020 0021 by Butler-Nicholson, Tyresse Jayson L, LPN Outcome: Progressing 02/25/2020 2346 by Butler-Nicholson, Erminie Foulks L, LPN Outcome: Progressing   Problem: Health Behavior/Discharge Planning: Goal: Identification of resources available to assist in meeting health care needs will improve 02/26/2020 0021 by Butler-Nicholson, Renaye Janicki L, LPN Outcome: Progressing 02/25/2020 2346 by Butler-Nicholson, Abbygale Lapid L, LPN Outcome: Progressing   Problem: Medication: Goal: Compliance with prescribed medication regimen will improve 02/26/2020 0021 by Butler-Nicholson, Omari Mcmanaway L, LPN Outcome: Progressing 02/25/2020 2346 by Butler-Nicholson, Faylene Allerton L, LPN Outcome: Progressing   Problem: Self-Concept: Goal: Ability to disclose and discuss suicidal ideas will improve 02/26/2020 0021 by  Butler-Nicholson, Ajiah Mcglinn L, LPN Outcome: Progressing 02/25/2020 2346 by Butler-Nicholson, Fadumo Heng L, LPN Outcome: Progressing Goal: Will verbalize positive feelings about self 02/26/2020 0021 by Butler-Nicholson, Shawna Wearing L, LPN Outcome: Progressing 02/25/2020 2346 by Butler-Nicholson, Patrick Salemi L, LPN Outcome: Progressing

## 2020-02-26 NOTE — Plan of Care (Signed)
  Problem: Education: Goal: Knowledge of [facility] Education information/materials will improve Outcome: Progressing Goal: Emotional status will improve Outcome: Progressing Goal: Mental status will improve Outcome: Progressing Goal: Verbalization of understanding the information provided will improve Outcome: Progressing   Problem: Activity: Goal: Interest or engagement in activities will improve Outcome: Progressing Goal: Sleeping patterns will improve Outcome: Progressing   Problem: Coping: Goal: Ability to verbalize frustrations and anger appropriately will improve Outcome: Progressing Goal: Ability to demonstrate self-control will improve Outcome: Progressing   Problem: Health Behavior/Discharge Planning: Goal: Identification of resources available to assist in meeting health care needs will improve Outcome: Progressing Goal: Compliance with treatment plan for underlying cause of condition will improve Outcome: Progressing   Problem: Physical Regulation: Goal: Ability to maintain clinical measurements within normal limits will improve Outcome: Progressing   Problem: Safety: Goal: Periods of time without injury will increase Outcome: Progressing   Problem: Education: Goal: Ability to make informed decisions regarding treatment will improve Outcome: Progressing   Problem: Coping: Goal: Coping ability will improve Outcome: Progressing   Problem: Health Behavior/Discharge Planning: Goal: Identification of resources available to assist in meeting health care needs will improve Outcome: Progressing   Problem: Medication: Goal: Compliance with prescribed medication regimen will improve Outcome: Progressing   Problem: Self-Concept: Goal: Ability to disclose and discuss suicidal ideas will improve Outcome: Progressing Goal: Will verbalize positive feelings about self Outcome: Progressing   

## 2020-02-26 NOTE — Progress Notes (Signed)
D: Pt alert and oriented. Pt reports experiencing anxiety w/o rating it and denies any depression at this time. Pt denies experiencing any pain at this time. Pt denies experiencing any SI/HI, or AVH at this time.   A: Scheduled medications administered to pt, per MD orders. Support and encouragement provided. Frequent verbal contact made. Routine safety checks conducted q15 minutes.   R: No adverse drug reactions noted. Pt verbally contracts for safety at this time. Pt complaint with medications and treatment plan. Pt interacts well with others on the unit. Pt remains safe at this time. Will continue to monitor.

## 2020-02-26 NOTE — Plan of Care (Signed)
  Problem: Group Participation °Goal: STG - Patient will engage in groups with a calm and appropriate mood at least 2x within 5 recreation therapy group sessions °Description: STG - Patient will engage in groups with a calm and appropriate mood at least 2x within 5 recreation therapy group sessions °Outcome: Progressing °  °

## 2020-02-26 NOTE — Progress Notes (Signed)
[facility] MD Progress Note  02/26/2020 3:51 PM Alexander Beard  MRN:  [id number] Subjective: Patient seen.  Mahlik seems to be doing quite a bit better.  He tells me with evident pride and some degree of surprise that he continues to feel pretty good.  He is spending his time drawing and coloring which are activities that he always finds rewarding.  He has been chatting with the staff in a positive manner and attending groups.  Tolerating medicine.  I had stopped the regular blood sugar checks because all of his sugars were staying pretty close to 100.  The incidences of enuresis appear to have decreased quite a bit.  Natnael tells me he is trying to be vigilant about it.  I think a lot of that may be one of his anxiety symptoms that when he is flustered he has poor control but when he is feeling calm or he is able to keep it under control.  If however it continues to be a regular problem I will still consider calling urology.  Meanwhile we are still looking for placement options. Principal Problem: Schizoaffective disorder, bipolar type ([place]) Diagnosis: Principal Problem:   Schizoaffective disorder, bipolar type ([place]) Active Problems:   Active autistic disorder   Diabetes ([place])   Hypertension   Prostate hypertrophy  Total Time spent with patient: 30 minutes  Past Psychiatric History: Longstanding history of chronic mental illness and disability  Past Medical History:  Past Medical History:  Diagnosis Date  . Anxiety   . Anxiety disorder due to known physiological condition    HOSPITALIZED 10/18  . Arthritis   . Autistic disorder, residual state   . COPD (chronic obstructive pulmonary disease) ([place])   . Depression   . Developmental disorder   . Diabetes mellitus without complication ([place])   . Dyslipidemia   . Esophageal reflux   . HOH (hard of hearing)    MILDLY  . Hypertension   . Obesity   . Overactive bladder   . Palpitations    ANXIETY  . Schizophrenia, schizoaffective ([place])   .  Sleep apnea   . Tremors of nervous system    HANDS DUE TO MEDICATIONS  . Urinary incontinence     Past Surgical History:  Procedure Laterality Date  . CATARACT EXTRACTION W/PHACO Left 11/14/2017   Procedure: CATARACT EXTRACTION PHACO AND INTRAOCULAR LENS PLACEMENT (IOC);  Surgeon: Birder Robson, MD;  Location: [facility] ORS;  Service: Ophthalmology;  Laterality: Left;  [place] 00:42 AP% 14.6 CDE 6.12 Fluid pack lot # [id number]  . COLONOSCOPY  01/28/2005  . COLONOSCOPY WITH PROPOFOL N/A 05/09/2016   Procedure: COLONOSCOPY WITH PROPOFOL;  Surgeon: Manya Silvas, MD;  Location: [facility] ENDOSCOPY;  Service: Endoscopy;  Laterality: N/A;  . ESOPHAGOGASTRODUODENOSCOPY N/A 05/09/2016   Procedure: ESOPHAGOGASTRODUODENOSCOPY (EGD);  Surgeon: Manya Silvas, MD;  Location: [facility] ENDOSCOPY;  Service: Endoscopy;  Laterality: N/A;  . FRACTURE SURGERY     ORIF SHOULDER  . TONSILLECTOMY     Family History:  Family History  Problem Relation Age of Onset  . Hypertension Mother   . Stroke Father   . Heart Problems Father    Family Psychiatric  History: See previous Social History:  Social History   Substance and Sexual Activity  Alcohol Use No  . Alcohol/week: 0.0 standard drinks     Social History   Substance and Sexual Activity  Drug Use No    Social History   Socioeconomic History  . Marital status: Single  Spouse name: Not on file  . Number of children: Not on file  . Years of education: Not on file  . Highest education level: Not on file  Occupational History  . Not on file  Tobacco Use  . Smoking status: Never Smoker  . Smokeless tobacco: Never Used  Vaping Use  . Vaping Use: Never used  Substance and Sexual Activity  . Alcohol use: No    Alcohol/week: 0.0 standard drinks  . Drug use: No  . Sexual activity: Never  Other Topics Concern  . Not on file  Social History Narrative   The patient never finished high school but did get his GED. He works in the past as a Forensic psychologist. He has never been married and has no children. He is currently in disability and [name] is his legal guardian. He has been living in group homes for many years.      No pending legal charges   Social Determinants of Health   Financial Resource Strain:   . Difficulty of Paying Living Expenses:   Food Insecurity:   . Worried About Charity fundraiser in the Last Year:   . Arboriculturist in the Last Year:   Transportation Needs:   . Film/video editor (Medical):   Marland Kitchen Lack of Transportation (Non-Medical):   Physical Activity:   . Days of Exercise per Week:   . Minutes of Exercise per Session:   Stress:   . Feeling of Stress :   Social Connections:   . Frequency of Communication with Friends and Family:   . Frequency of Social Gatherings with Friends and Family:   . Attends Religious Services:   . Active Member of Clubs or Organizations:   . Attends Archivist Meetings:   Marland Kitchen Marital Status:    Additional Social History:                         Sleep: Fair  Appetite:  Fair  Current Medications: Current Facility-Administered Medications  Medication Dose Route Frequency Provider Last Rate Last Admin  . acetaminophen (TYLENOL) tablet 650 mg  650 mg Oral Q6H PRN [name], MD      . alum & mag hydroxide-simeth (MAALOX/MYLANTA) 200-200-20 MG/5ML suspension 30 mL  30 mL Oral Q4H PRN [name], MD      . aspirin EC tablet 81 mg  81 mg Oral Daily Blessen Kimbrough, [name], MD   81 mg at 02/26/20 0814  . clonazePAM (KLONOPIN) tablet 1 mg  1 mg Oral QID Cristalle Rohm, [name], MD   1 mg at 02/26/20 1202  . docusate sodium (COLACE) capsule 100 mg  100 mg Oral BID Emrik Erhard, [name], MD   100 mg at 02/26/20 0813  . gabapentin (NEURONTIN) capsule 100 mg  100 mg Oral TID Theressa Piedra, [name], MD   100 mg at 02/26/20 1202  . hydrOXYzine (ATARAX/VISTARIL) tablet 10 mg  10 mg Oral TID PRN [name], MD   10 mg at 02/20/20 2127  . lamoTRIgine  (LAMICTAL) tablet 100 mg  100 mg Oral QHS Chevon Laufer, [name], MD   100 mg at 02/25/20 2116  . linagliptin (TRADJENTA) tablet 5 mg  5 mg Oral Daily Corri Delapaz, [name], MD   5 mg at 02/26/20 0814  . loratadine (CLARITIN) tablet 10 mg  10 mg Oral Daily Krystalle Pilkington, [name], MD   10 mg at 02/26/20 0814  . magnesium  hydroxide (MILK OF MAGNESIA) suspension 30 mL  30 mL Oral Daily PRN [name], MD      . metFORMIN (GLUCOPHAGE) tablet 850 mg  850 mg Oral BID WC Cullin Dishman, [name], MD   850 mg at 02/26/20 0816  . metoprolol succinate (TOPROL-XL) 24 hr tablet 50 mg  50 mg Oral Daily Willia Genrich, [name], MD   50 mg at 02/26/20 0813  . oxybutynin (DITROPAN) tablet 10 mg  10 mg Oral BID Zaylen Susman, [name], MD   10 mg at 02/26/20 0816  . pantoprazole (PROTONIX) EC tablet 40 mg  40 mg Oral Daily Shelonda Saxe, [name], MD   40 mg at 02/26/20 0814  . QUEtiapine (SEROQUEL) tablet 100 mg  100 mg Oral QID Kent Braunschweig, [name], MD   100 mg at 02/26/20 1202  . QUEtiapine (SEROQUEL) tablet 400 mg  400 mg Oral QHS [name], NP   400 mg at 02/25/20 2117  . sertraline (ZOLOFT) tablet 100 mg  100 mg Oral BID Tharon Kitch, [name], MD   100 mg at 02/26/20 0814  . simvastatin (ZOCOR) tablet 20 mg  20 mg Oral q1800 Xiara Knisley, [name], MD   20 mg at 02/25/20 1716    Lab Results:  Results for orders placed or performed during the hospital encounter of 02/19/20 (from the past 48 hour(s))  Glucose, capillary     Status: Abnormal   Collection Time: 02/24/20  4:02 PM  Result Value Ref Range   Glucose-Capillary 103 (H) 70 - 99 mg/dL    Comment: Glucose reference range applies only to samples taken after fasting for at least 8 hours.   Comment 1 Notify RN   Urinalysis, Complete w Microscopic     Status: Abnormal   Collection Time: 02/24/20  5:20 PM  Result Value Ref Range   Color, Urine YELLOW (A) YELLOW   APPearance HAZY (A) CLEAR   Specific Gravity, Urine 1.016 1.005 - 1.030   pH 5.0 5.0 - 8.0   Glucose, UA NEGATIVE NEGATIVE mg/dL   Hgb urine  dipstick NEGATIVE NEGATIVE   Bilirubin Urine NEGATIVE NEGATIVE   Ketones, ur NEGATIVE NEGATIVE mg/dL   Protein, ur NEGATIVE NEGATIVE mg/dL   Nitrite NEGATIVE NEGATIVE   Leukocytes,Ua NEGATIVE NEGATIVE   RBC / HPF 0-5 0 - 5 RBC/hpf   WBC, UA 0-5 0 - 5 WBC/hpf   Bacteria, UA NONE SEEN NONE SEEN   Squamous Epithelial / LPF 0-5 0 - 5   Mucus PRESENT     Comment: Performed at [facility], [address] [place], [place] [address]  Glucose, capillary     Status: None   Collection Time: 02/24/20  8:47 PM  Result Value Ref Range   Glucose-Capillary 98 70 - 99 mg/dL    Comment: Glucose reference range applies only to samples taken after fasting for at least 8 hours.   Comment 1 Notify RN   Glucose, capillary     Status: None   Collection Time: 02/25/20  6:56 AM  Result Value Ref Range   Glucose-Capillary 94 70 - 99 mg/dL    Comment: Glucose reference range applies only to samples taken after fasting for at least 8 hours.  Glucose, capillary     Status: Abnormal   Collection Time: 02/25/20 11:18 AM  Result Value Ref Range   Glucose-Capillary 126 (H) 70 - 99 mg/dL    Comment: Glucose reference range applies only to samples taken after fasting for at least 8 hours.  Blood Alcohol level:  Lab Results  Component Value Date   ETH <10 01/22/2020   ETH <10 17/51/0258    Metabolic Disorder Labs: Lab Results  Component Value Date   HGBA1C 6.6 (H) 02/20/2020   MPG 142.72 02/20/2020   No results found for: PROLACTIN Lab Results  Component Value Date   CHOL 153 04/10/2015   TRIG 166 (H) 04/10/2015   HDL 50 04/10/2015   CHOLHDL 3.1 04/10/2015   VLDL 33 04/10/2015   LDLCALC 70 04/10/2015    Physical Findings: AIMS:  , ,  ,  ,    CIWA:    COWS:     Musculoskeletal: Strength & Muscle Tone: within normal limits Gait & Station: normal Patient leans: N/A  Psychiatric Specialty Exam: Physical Exam Vitals and nursing note reviewed.  Constitutional:      Appearance:  He is well-developed.  HENT:     Head: Normocephalic and atraumatic.  Eyes:     Conjunctiva/sclera: Conjunctivae normal.     Pupils: Pupils are equal, round, and reactive to light.  Cardiovascular:     Heart sounds: Normal heart sounds.  Pulmonary:     Effort: Pulmonary effort is normal.  Abdominal:     Palpations: Abdomen is soft.  Musculoskeletal:        General: Normal range of motion.     Cervical back: Normal range of motion.  Skin:    General: Skin is warm and dry.  Neurological:     General: No focal deficit present.     Mental Status: He is alert.  Psychiatric:        Attention and Perception: Attention normal.        Mood and Affect: Mood normal.        Speech: Speech normal.        Behavior: Behavior normal.        Thought Content: Thought content normal.        Cognition and Memory: Cognition is impaired.        Judgment: Judgment is impulsive.     Review of Systems  Constitutional: Negative.   HENT: Negative.   Eyes: Negative.   Respiratory: Negative.   Cardiovascular: Negative.   Gastrointestinal: Negative.   Musculoskeletal: Negative.   Skin: Negative.   Neurological: Negative.   Psychiatric/Behavioral: Positive for confusion. The patient is nervous/anxious.     Blood pressure 117/76, pulse 66, temperature 98.1 F (36.7 C), temperature source Oral, resp. rate 18, height 5\' 8"  (1.727 m), weight 104.3 kg, SpO2 98 %.Body mass index is 34.96 kg/m.  General Appearance: Casual  Eye Contact:  Good  Speech:  Clear and Coherent  Volume:  Decreased  Mood:  Euthymic  Affect:  Constricted  Thought Process:  Coherent  Orientation:  Full (Time, Place, and Person)  Thought Content:  Logical  Suicidal Thoughts:  No  Homicidal Thoughts:  No  Memory:  Immediate;   Fair Recent;   Poor Remote;   Fair  Judgement:  Impaired  Insight:  Shallow  Psychomotor Activity:  Decreased  Concentration:  Concentration: Fair  Recall:  [place] of Knowledge:  Poor   Language:  Fair  Akathisia:  No  Handed:  Right  AIMS (if indicated):     Assets:  Desire for Improvement Resilience  ADL's:  Impaired  Cognition:  Impaired,  Mild  Sleep:  Number of Hours: 5.45     Treatment Plan Summary: Daily contact with patient to assess and evaluate symptoms and progress in  treatment, Medication management and Plan History of schizophrenia history of anxiety history of developmental disability.  Stabilizing well at this point.  Lots of support and encouragement provided.  No change to medicine for now.  Treatment team will continue to stay in touch with his family about discharge options.  Alethia Berthold, MD 02/26/2020, 3:51 PM

## 2020-02-26 NOTE — Progress Notes (Signed)
   02/26/20 1400  Clinical Encounter Type  Visited With Patient  Visit Type Follow-up;Spiritual support;Social support  Referral From Nurse  Consult/Referral To Chaplain  Visited Pt per OR form nurse. Pt wanted to talk to Ch. Pt told me he was having a lot of pressure in his head thinking about all the bad things he has done in life. Pt said that he believes in God. Ch asked Pt does he believe God forgave him. Pt said yes, so I told him if he believe God forgave him the he needs to forgive himself. Ch prayed for Pt and will follow-up with Pt later.

## 2020-02-27 NOTE — Progress Notes (Signed)
[facility] MD Progress Note  02/27/2020 4:16 PM Alexander Beard  MRN:  [id number] Subjective: Patient seen chart reviewed.  Patient tells me he feels even better today.  Every day he grows more confident and pleased with how he is taking care of himself.  He tells me he is thinking more clearly.  Certainly not having any hallucinations.  No suicidal or homicidal ideation.  Has not had any acute behavior issues.  On the other hand he still is wetting himself frequently. Principal Problem: Schizoaffective disorder, bipolar type ([place]) Diagnosis: Principal Problem:   Schizoaffective disorder, bipolar type ([place]) Active Problems:   Active autistic disorder   Diabetes ([place])   Hypertension   Prostate hypertrophy  Total Time spent with patient: 1 hour  Past Psychiatric History: Past history of longstanding chronic developmental disability and schizophrenia  Past Medical History:  Past Medical History:  Diagnosis Date  . Anxiety   . Anxiety disorder due to known physiological condition    HOSPITALIZED 10/18  . Arthritis   . Autistic disorder, residual state   . COPD (chronic obstructive pulmonary disease) ([place])   . Depression   . Developmental disorder   . Diabetes mellitus without complication ([place])   . Dyslipidemia   . Esophageal reflux   . HOH (hard of hearing)    MILDLY  . Hypertension   . Obesity   . Overactive bladder   . Palpitations    ANXIETY  . Schizophrenia, schizoaffective ([place])   . Sleep apnea   . Tremors of nervous system    HANDS DUE TO MEDICATIONS  . Urinary incontinence     Past Surgical History:  Procedure Laterality Date  . CATARACT EXTRACTION W/PHACO Left 11/14/2017   Procedure: CATARACT EXTRACTION PHACO AND INTRAOCULAR LENS PLACEMENT (IOC);  Surgeon: Birder Robson, MD;  Location: [facility] ORS;  Service: Ophthalmology;  Laterality: Left;  [place] 00:42 AP% 14.6 CDE 6.12 Fluid pack lot # [id number]  . COLONOSCOPY  01/28/2005  . COLONOSCOPY WITH PROPOFOL N/A 05/09/2016    Procedure: COLONOSCOPY WITH PROPOFOL;  Surgeon: Manya Silvas, MD;  Location: [facility] ENDOSCOPY;  Service: Endoscopy;  Laterality: N/A;  . ESOPHAGOGASTRODUODENOSCOPY N/A 05/09/2016   Procedure: ESOPHAGOGASTRODUODENOSCOPY (EGD);  Surgeon: Manya Silvas, MD;  Location: [facility] ENDOSCOPY;  Service: Endoscopy;  Laterality: N/A;  . FRACTURE SURGERY     ORIF SHOULDER  . TONSILLECTOMY     Family History:  Family History  Problem Relation Age of Onset  . Hypertension Mother   . Stroke Father   . Heart Problems Father    Family Psychiatric  History: See previous Social History:  Social History   Substance and Sexual Activity  Alcohol Use No  . Alcohol/week: 0.0 standard drinks     Social History   Substance and Sexual Activity  Drug Use No    Social History   Socioeconomic History  . Marital status: Single    Spouse name: Not on file  . Number of children: Not on file  . Years of education: Not on file  . Highest education level: Not on file  Occupational History  . Not on file  Tobacco Use  . Smoking status: Never Smoker  . Smokeless tobacco: Never Used  Vaping Use  . Vaping Use: Never used  Substance and Sexual Activity  . Alcohol use: No    Alcohol/week: 0.0 standard drinks  . Drug use: No  . Sexual activity: Never  Other Topics Concern  . Not on file  Social History Narrative  The patient never finished high school but did get his GED. 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 He is currently in disability and [name] is his legal guardian. He has been living in group homes for many years.      No pending legal charges   Social Determinants of Health   Financial Resource Strain:   . Difficulty of Paying Living Expenses: Not on file  Food Insecurity:   . Worried About Charity fundraiser in the Last Year: Not on file  . Ran Out of Food in the Last Year: Not on file  Transportation Needs:   .  Lack of Transportation (Medical): Not on file  . Lack of Transportation (Non-Medical): Not on file  Physical Activity:   . Days of Exercise per Week: Not on file  . Minutes of Exercise per Session: Not on file  Stress:   . Feeling of Stress : Not on file  Social Connections:   . Frequency of Communication with Friends and Family: Not on file  . Frequency of Social Gatherings with Friends and Family: Not on file  . Attends Religious Services: Not on file  . Active Member of Clubs or Organizations: Not on file  . Attends Archivist Meetings: Not on file  . Marital Status: Not on file   Additional Social History:                         Sleep: Fair  Appetite:  Fair  Current Medications: Current Facility-Administered Medications  Medication Dose Route Frequency Provider Last Rate Last Admin  . acetaminophen (TYLENOL) tablet 650 mg  650 mg Oral Q6H PRN [name], MD      . alum & mag hydroxide-simeth (MAALOX/MYLANTA) 200-200-20 MG/5ML suspension 30 mL  30 mL Oral Q4H PRN [name], MD      . aspirin EC tablet 81 mg  81 mg Oral Daily Baila Rouse, [name], MD   81 mg at 02/27/20 0910  . clonazePAM (KLONOPIN) tablet 1 mg  1 mg Oral QID Willmar Stockinger, [name], MD   1 mg at 02/27/20 1145  . docusate sodium (COLACE) capsule 100 mg  100 mg Oral BID Twilla Khouri, [name], MD   100 mg at 02/27/20 0808  . gabapentin (NEURONTIN) capsule 100 mg  100 mg Oral TID Braidyn Scorsone T, MD   100 mg at 02/27/20 1145  . hydrOXYzine (ATARAX/VISTARIL) tablet 10 mg  10 mg Oral TID PRN [name], MD   10 mg at 02/20/20 2127  . lamoTRIgine (LAMICTAL) tablet 100 mg  100 mg Oral QHS Zariah Cavendish, [name], MD   100 mg at 02/26/20 2129  . linagliptin (TRADJENTA) tablet 5 mg  5 mg Oral Daily Dushaun Okey, [name], MD   5 mg at 02/27/20 1610  . loratadine (CLARITIN) tablet 10 mg  10 mg Oral Daily Tinesha Siegrist, [name], MD   10 mg at 02/27/20 0809  . magnesium hydroxide (MILK OF MAGNESIA) suspension 30 mL  30 mL  Oral Daily PRN [name], MD      . metFORMIN (GLUCOPHAGE) tablet 850 mg  850 mg Oral BID WC Kalyan Barabas, [name], MD   850 mg at 02/27/20 9604  . metoprolol succinate (TOPROL-XL) 24 hr tablet 50 mg  50 mg Oral Daily Fajr Fife, [name], MD   50 mg at 02/27/20 0809  . oxybutynin (DITROPAN) tablet  10 mg  10 mg Oral BID Pearse Shiffler, [name], MD   10 mg at 02/27/20 0819  . pantoprazole (PROTONIX) EC tablet 40 mg  40 mg Oral Daily Jaquelyne Firkus, [name], MD   40 mg at 02/27/20 0809  . QUEtiapine (SEROQUEL) tablet 100 mg  100 mg Oral QID Shavonna Corella T, MD   100 mg at 02/27/20 1146  . QUEtiapine (SEROQUEL) tablet 400 mg  400 mg Oral QHS [name], NP   400 mg at 02/26/20 2129  . sertraline (ZOLOFT) tablet 100 mg  100 mg Oral BID Deari Sessler, [name], MD   100 mg at 02/27/20 0811  . simvastatin (ZOCOR) tablet 20 mg  20 mg Oral q1800 Khori Underberg, [name], MD   20 mg at 02/26/20 1717    Lab Results: No results found for this or any previous visit (from the past 48 hour(s)).  Blood Alcohol level:  Lab Results  Component Value Date   ETH <10 01/22/2020   ETH <10 47/03/6282    Metabolic Disorder Labs: Lab Results  Component Value Date   HGBA1C 6.6 (H) 02/20/2020   MPG 142.72 02/20/2020   No results found for: PROLACTIN Lab Results  Component Value Date   CHOL 153 04/10/2015   TRIG 166 (H) 04/10/2015   HDL 50 04/10/2015   CHOLHDL 3.1 04/10/2015   VLDL 33 04/10/2015   LDLCALC 70 04/10/2015    Physical Findings: AIMS:  , ,  ,  ,    CIWA:    COWS:     Musculoskeletal: Strength & Muscle Tone: within normal limits Gait & Station: normal Patient leans: N/A  Psychiatric Specialty Exam: Physical Exam Vitals and nursing note reviewed.  Constitutional:      Appearance: He is well-developed.  HENT:     Head: Normocephalic and atraumatic.  Eyes:     Conjunctiva/sclera: Conjunctivae normal.     Pupils: Pupils are equal, round, and reactive to light.  Cardiovascular:     Heart sounds: Normal  heart sounds.  Pulmonary:     Effort: Pulmonary effort is normal.  Abdominal:     Palpations: Abdomen is soft.  Musculoskeletal:        General: Normal range of motion.     Cervical back: Normal range of motion.  Skin:    General: Skin is warm and dry.  Neurological:     General: No focal deficit present.     Mental Status: He is alert.  Psychiatric:        Attention and Perception: Attention normal.        Mood and Affect: Mood normal.        Speech: Speech is delayed.        Behavior: Behavior normal. Behavior is cooperative.        Thought Content: Thought content normal.        Cognition and Memory: Cognition is impaired.        Judgment: Judgment normal.     Review of Systems  Constitutional: Negative.   HENT: Negative.   Eyes: Negative.   Respiratory: Negative.   Cardiovascular: Negative.   Gastrointestinal: Negative.   Genitourinary: Positive for enuresis.  Musculoskeletal: Negative.   Skin: Negative.   Neurological: Negative.   Psychiatric/Behavioral: Negative.     Blood pressure 117/76, pulse 66, temperature 98.1 F (36.7 C), temperature source Oral, resp. rate 18, height 5\' 8"  (1.727 m), weight 104.3 kg, SpO2 98 %.Body mass index is 34.96 kg/m.  General Appearance: Casual  Eye  Contact:  Good  Speech:  Garbled  Volume:  Decreased  Mood:  Euthymic  Affect:  Congruent  Thought Process:  Coherent  Orientation:  Full (Time, Place, and Person)  Thought Content:  Logical  Suicidal Thoughts:  No  Homicidal Thoughts:  No  Memory:  Immediate;   Fair Recent;   Fair Remote;   Fair  Judgement:  Fair  Insight:  Fair  Psychomotor Activity:  Normal  Concentration:  Concentration: Fair  Recall:  [employer] of Knowledge:  Fair  Language:  Fair  Akathisia:  No  Handed:  Right  AIMS (if indicated):     Assets:  Desire for Improvement Resilience Social Support  ADL's:  Impaired  Cognition:  Impaired,  Mild  Sleep:  Number of Hours: 6.75     Treatment Plan  Summary: Daily contact with patient to assess and evaluate symptoms and progress in treatment, Medication management and Plan Lots of supportive therapy and encouragement.  No change to medicine.  Since the enuresis continues to be a problem I am going to consult urology.  Alethia Berthold, MD 02/27/2020, 4:16 PM

## 2020-02-27 NOTE — Plan of Care (Signed)
Patient stated that he is doing better with his anxiety.Patient had an incontinent episode in the bed.Patient expressed sorry for that and he wanted to clean up everything by himself.Patient did and he feels proud of his actions.Denies SI,HI and AVH.Appetite and energy level good.Appropriate with staff & peers.Support an encouragement given.

## 2020-02-27 NOTE — Progress Notes (Signed)
Recreation Therapy Notes  Date: 02/27/2020  Time: 9:30 am  Location: Room 21    Behavioral response: Appropriate   Intervention Topic: Animal Assisted Therapy   Discussion/Intervention:  Animal Assisted Therapy took place today during group.  Animal Assisted Therapy is the planned inclusion of an animal in a patient's treatment plan. The patients were able to engage in therapy with an animal during group. Participants were educated on what a service dog is and the different between a support dog and a service dog. Patient were informed on the many animal needs there are and how their needs are similar. Individuals were enlightened on the process to get a service animal or support animal. Patients got the opportunity to pet the animal and were offered emotional support from the animal and staff.  Clinical Observations/Feedback:  Patient came to group and was on topic and was focused on what peers and staff had to say. Participant shared their experiences and history with animals. Individual was social with peers, staff and animal while participating in group.  Kedra Mcglade LRT/CTRS         Jaris Kohles 02/27/2020 11:48 AM

## 2020-02-28 DIAGNOSIS — R3981 Functional urinary incontinence: Secondary | ICD-10-CM

## 2020-02-28 LAB — GLUCOSE, CAPILLARY: Glucose-Capillary: 76 mg/dL (ref 70–99)

## 2020-02-28 NOTE — Plan of Care (Signed)
Patient calm and pleasant on the unit. Patient states he is feeling better than he has in a long time   Problem: Education: Goal: Emotional status will improve Outcome: Progressing Goal: Mental status will improve Outcome: Progressing

## 2020-02-28 NOTE — BHH Counselor (Signed)
CSW spoke with the patient's brother who requested that FL2 be sent to [employer], [phone].  CSW reviewed that patient would have to be asked and permission given.  Assunta Curtis, MSW, LCSW 02/28/2020 3:27 PM

## 2020-02-28 NOTE — Progress Notes (Signed)
Patient less agitated this evening compared to last night. Patient denies SI/HI/AVH and pain. Patient stated he feels the best tonight that he has in a long time. Patient compliant with medication administration per MD orders. Patient given education, support, and encouragement to be active in his treatment plan. Patient being monitored Q 15 minutes for safety per unit protocol. Patient remains safe on the unit. Patient presents with mild confusion but is easily redirectable.

## 2020-02-28 NOTE — BHH Counselor (Signed)
CSW contacted the following to locate possible bed placement:  Josepha Pigg, [place], [phone] -Phone rings continuously, unable to lvm.  [place], [phone] -Declined due to pt history of wandering away from group home.  Dan Maker, [phone] [facility] faxed to [place], [place] faxed to [place], [place] group home [phone] -No bed available  [place], [place], [phone] [facility] faxed to [phone] [place], [phone] -LVM at 137pm, awaiting response.  Lavada Mesi, [phone] -Mailbox full, unable to lvm  Marinell Blight, [phone] -No male beds available  Constance Holster [phone] -No male bed available  Manon Hilding, [phone] -212pm lvm, awaiting response  Chrystie Nose, [place], [phone] [facility] faxed to [phone]   Terri Skains, [place] faxed to [place], [place] [facility] [place], [phone] -No male bed available

## 2020-02-28 NOTE — Consult Note (Addendum)
Urology Consult  I have been asked to see the patient by [name], for evaluation and management of urinary incontinence.  Chief Complaint: Urinary incontinence  History of Present Illness: Alexander Beard is a 64 y.o. year old male with PMH BPH, OAB, sleep apnea, schizophrenia, autistic disorder, and anxiety admitted on 02/19/2020 with reports of imminent loss of his group home placement. He is awaiting new placement while inpatient.  Urinalysis dated 02/24/2020 revealed 0-5 RBCs/hpf, 0-5 WBCs/hpf, no bacteria, and no nitrites. No abdominal or pelvic imaging available for review.  Patient is a poor historian, however he reports a chronic history of "constant" urgency. He is unable to say how often he urinates or has leakage. He reports nocturia x2-3 and delaying urination when he is participating in activities he enjoys, which makes his urgency worse. He does not believe he has ever seen a urologist. He does report intermittent constipation. He states his urinary symptoms used to be worse but have improved recently. He cannot clarify the timeline of this or what may have caused his symptomatic improvement.  Nursing staff has not observed him voiding frequently. They report some nocturnal enuresis but are unaware of daytime occurrences of urinary incontinence. They note that his self-care in general has improved since his admission as his confusion has cleared. Per past MD note, he may have worse urinary leakage associated with severe anxiety symptoms.  Per chart review, he has been taking oxybutynin since at least 07/15/2019. Outpatient medications include tamsulosin and solifenacin; he has not received tamsulosin during this admission. He was previously referred to our clinic on 09/12/2019 for management of his OAB and incontinence but was never seen.  There have been documented references to BPH dating back to 2014 without additional context.  Bladder scan today with 227mL; last void unknown  (earlier in the day per patient). He was prompted to void and unable to do so, however it is unclear if he was unable to urinate or distracted by this consult.  Past Medical History:  Diagnosis Date  . Anxiety   . Anxiety disorder due to known physiological condition    HOSPITALIZED 10/18  . Arthritis   . Autistic disorder, residual state   . COPD (chronic obstructive pulmonary disease) ([place])   . Depression   . Developmental disorder   . Diabetes mellitus without complication ([place])   . Dyslipidemia   . Esophageal reflux   . HOH (hard of hearing)    MILDLY  . Hypertension   . Obesity   . Overactive bladder   . Palpitations    ANXIETY  . Schizophrenia, schizoaffective ([place])   . Sleep apnea   . Tremors of nervous system    HANDS DUE TO MEDICATIONS  . Urinary incontinence     Past Surgical History:  Procedure Laterality Date  . CATARACT EXTRACTION W/PHACO Left 11/14/2017   Procedure: CATARACT EXTRACTION PHACO AND INTRAOCULAR LENS PLACEMENT (IOC);  Surgeon: Birder Robson, MD;  Location: [facility] ORS;  Service: Ophthalmology;  Laterality: Left;  [place] 00:42 AP% 14.6 CDE 6.12 Fluid pack lot # [id number]  . COLONOSCOPY  01/28/2005  . COLONOSCOPY WITH PROPOFOL N/A 05/09/2016   Procedure: COLONOSCOPY WITH PROPOFOL;  Surgeon: Manya Silvas, MD;  Location: [facility] ENDOSCOPY;  Service: Endoscopy;  Laterality: N/A;  . ESOPHAGOGASTRODUODENOSCOPY N/A 05/09/2016   Procedure: ESOPHAGOGASTRODUODENOSCOPY (EGD);  Surgeon: Manya Silvas, MD;  Location: [facility] ENDOSCOPY;  Service: Endoscopy;  Laterality: N/A;  . FRACTURE SURGERY     ORIF SHOULDER  .  TONSILLECTOMY      Home Medications:  No outpatient medications have been marked as taking for the 02/19/20 encounter [facility] Encounter).    Allergies:  Allergies  Allergen Reactions  . Other Other (See Comments)    MYACINS  . Penicillins Nausea And Vomiting  . Cortizone-10 [Hydrocortisone] Rash    Family History  Problem Relation Age of  Onset  . Hypertension Mother   . Stroke Father   . Heart Problems Father     Social History:  reports that he has never smoked. He has never used smokeless tobacco. He reports that he does not drink alcohol and does not use drugs.  ROS: A complete review of systems was performed.  All systems are negative except for pertinent findings as noted.  Physical Exam:  Vital signs in last 24 hours: Temp:  [98.4 F (36.9 C)] 98.4 F (36.9 C) (08/20 0646) Pulse Rate:  [75] 75 (08/20 0646) Resp:  [18] 18 (08/20 0646) BP: (106)/(76) 106/76 (08/20 0646) SpO2:  [100 %] 100 % (08/20 0646) Constitutional:  Awake, no acute distress HEENT: [place] AT, moist mucus membranes Cardiovascular: No clubbing, cyanosis, or edema Respiratory: Normal respiratory effort Neurologic: Grossly intact, no focal deficits, moving all 4 extremities Psychiatric: Pleasant, talkative, rambling speech  Laboratory Data:  Urinalysis    Component Value Date/Time   COLORURINE YELLOW (A) 02/24/2020 1720   APPEARANCEUR HAZY (A) 02/24/2020 1720   APPEARANCEUR Clear 01/09/2013 1152   LABSPEC 1.016 02/24/2020 1720   LABSPEC 1.021 01/09/2013 1152   PHURINE 5.0 02/24/2020 1720   GLUCOSEU NEGATIVE 02/24/2020 1720   GLUCOSEU Negative 01/09/2013 1152   HGBUR NEGATIVE 02/24/2020 1720   BILIRUBINUR NEGATIVE 02/24/2020 1720   BILIRUBINUR Negative 01/09/2013 1152   KETONESUR NEGATIVE 02/24/2020 1720   PROTEINUR NEGATIVE 02/24/2020 1720   NITRITE NEGATIVE 02/24/2020 1720   LEUKOCYTESUR NEGATIVE 02/24/2020 1720   LEUKOCYTESUR Negative 01/09/2013 1152   Results for orders placed or performed during the hospital encounter of 02/19/20  SARS Coronavirus 2 by RT PCR (hospital order, performed in [facility] hospital lab) Nasopharyngeal Nasopharyngeal Swab     Status: None   Collection Time: 02/19/20  3:54 PM   Specimen: Nasopharyngeal Swab  Result Value Ref Range Status   SARS Coronavirus 2 NEGATIVE NEGATIVE Final    Comment:  (NOTE) SARS-CoV-2 target nucleic acids are NOT DETECTED.  The SARS-CoV-2 RNA is generally detectable in upper and lower respiratory specimens during the acute phase of infection. The lowest concentration of SARS-CoV-2 viral copies this assay can detect is 250 copies / mL. A negative result does not preclude SARS-CoV-2 infection and should not be used as the sole basis for treatment or other patient management decisions.  A negative result may occur with improper specimen collection / handling, submission of specimen other than nasopharyngeal swab, presence of viral mutation(s) within the areas targeted by this assay, and inadequate number of viral copies (<250 copies / mL). A negative result must be combined with clinical observations, patient history, and epidemiological information.  Fact Sheet for Patients:   StrictlyIdeas.no  Fact Sheet for Healthcare Providers: BankingDealers.co.za  This test is not yet approved or  cleared by the [place] FDA and has been authorized for detection and/or diagnosis of SARS-CoV-2 by FDA under an Emergency Use Authorization (EUA).  This EUA will remain in effect (meaning this test can be used) for the duration of the COVID-19 declaration under Section 564(b)(1) of the Act, 21 U.S.C. section 360bbb-3(b)(1), unless the authorization is terminated  or revoked sooner.  Performed at [facility], [address] [place], [place] [address]    Assessment & Plan:  64 year old male with PMH BPH, OAB, sleep apnea, schizophrenia, autistic disorder, and anxiety admitted to the behavioral health unit to await housing placement with reports of ongoing urinary urgency and frequency with urinary incontinence, particularly at night. Patient reports delaying urination during the day when participating in activities he enjoys.  History is somewhat unclear, however I suspect his urgency and leakage to be  multifactorial with BPH, OAB, sleep apnea, behavioral factors, and pharmacotherapy each playing a role.  -BPH: Bladder scan 22mL today reassuring for high-volume urinary retention, however it is unclear how well he is emptying. Recommend restarting Flomax for management of this aspect of his picture with plan for repeat bladder scan as below. -OAB: likely secondary to BPH above. Caution with use of anticholinergics given his reports of constipation. If this continues, may consider Myrbetriq as an alternative. -Sleep apnea: increases overnight urinary production, increasing nocturia. Unclear if patient does not have CPAP or is noncompliant with this; intervention may not be feasible on this point. -Behavioral: at least some element of delayed voiding per patient reports. Recommend timed voiding every 3-4 hours during the day. -Pharmacotherapy: nighttime medications with sedating effects likely contributing to nocturnal enuresis.  With reassuring UA, no acute symptomatic changes, and reassuring bladder scan, recommend conservative management with plans for outpatient urology follow up. No indication for further imaging or testing at this time.  Recommendations: -Start Flomax 0.4 mg daily -Repeat bladder scan in 3 days, to be performed following a void -May continue oxybutynin, consider Myrbetriq if constipation persists -Initiate timed voiding every 3-4 hours during the day -Outpatient follow-up with urology  Thank you for involving me in this patient's care, please page with any further questions or concerns.  Debroah Loop, PA-C 02/28/2020 8:16 AM

## 2020-02-28 NOTE — Progress Notes (Signed)
Recreation Therapy Notes  Date: 02/28/2020  Time: 9:30 am  Location: Craft room   Behavioral response: Appropriate  Intervention Topic: Self-care   Discussion/Intervention:  Group content today was focused on Self-Care. The group defined self-care and some positive ways they care for themselves. Individuals expressed ways and reasons why they neglected any self-care in the past. Patients described ways to improve self-care in the future. The group explained what could happen if they did not do any self-care activities at all. The group participated in the intervention "self-care assessment" where they had a chance to discover some of their weaknesses and strengths in self- care. Patient came up with a self-care plan to improve themselves in the future.  Clinical Observations/Feedback:  Patient came to group and explained that he needs help with self-care but is working on being independent. Individual was social with peers and staff while participating in the intervention. Avyanna Spada LRT/CTRS         Nhyla Nappi 02/28/2020 12:36 PM

## 2020-02-28 NOTE — BHH Counselor (Signed)
CSW spoke with [name], Age with [facility], [phone].  Her agency is helping the patient's brother to find placement.  However, Ms. Harris made it clear that the agency is not finding placement they are assisting this CSW find placement which is different from reports from the patient's brother.    She requested that CSW send her the FL@ and sent to another agency. CSW explained that she can not send the FL2 to another entity outside of her due to patient not having given permission.    She requested that the [place] state that patient have a memory care issue, requires assistance in dressing, bathing, toileting.  CSW explained that a FL2 has been completed, but does not reflect that as patient is capable of doing this without issue on the unit.  CSW explained that patient would need to be examined by a psychologist to have a memory diagnosis and at this time does not have access to a psychologist.    She expressed a lot of concern for finding appropriate placement for the patient and pt needing to be in a locked facility.   Assunta Curtis, MSW, LCSW 02/28/2020 11:48 AM

## 2020-02-28 NOTE — Plan of Care (Signed)
D- Patient alert and oriented to person, place, and situation. Patient presents in a pleasant mood on assessment stating that he slept "pretty good" last night and had no complaints to voice to this Probation officer. Patient endorsed passive SI, stating "I have those thoughts a lot, they're just in my head, I don't ever do it". Patient also endorsed both depression and anxiety, reporting "I have some, but I can handle it. I think that's better, I have less of that", regarding his anxiety. Patient denies HI, AVH, and pain at this time. Patient had no stated goals for today.   A- Scheduled medications administered to patient, per MD orders. Support and encouragement provided.  Routine safety checks conducted every 15 minutes.  Patient informed to notify staff with problems or concerns.  R- No adverse drug reactions noted. Patient contracts for safety at this time. Patient compliant with medications and treatment plan. Patient receptive, calm, and cooperative. Patient interacts well with others on the unit.  Patient remains safe at this time.  Problem: Education: Goal: Knowledge of [place] [facility] Education information/materials will improve Outcome: Progressing Goal: Emotional status will improve Outcome: Progressing Goal: Mental status will improve Outcome: Progressing Goal: Verbalization of understanding the information provided will improve Outcome: Progressing   Problem: Activity: Goal: Interest or engagement in activities will improve Outcome: Progressing Goal: Sleeping patterns will improve Outcome: Progressing   Problem: Coping: Goal: Ability to verbalize frustrations and anger appropriately will improve Outcome: Progressing Goal: Ability to demonstrate self-control will improve Outcome: Progressing   Problem: Health Behavior/Discharge Planning: Goal: Identification of resources available to assist in meeting health care needs will improve Outcome: Progressing Goal: Compliance with  treatment plan for underlying cause of condition will improve Outcome: Progressing   Problem: Physical Regulation: Goal: Ability to maintain clinical measurements within normal limits will improve Outcome: Progressing   Problem: Safety: Goal: Periods of time without injury will increase Outcome: Progressing   Problem: Education: Goal: Ability to make informed decisions regarding treatment will improve Outcome: Progressing   Problem: Coping: Goal: Coping ability will improve Outcome: Progressing   Problem: Health Behavior/Discharge Planning: Goal: Identification of resources available to assist in meeting health care needs will improve Outcome: Progressing   Problem: Medication: Goal: Compliance with prescribed medication regimen will improve Outcome: Progressing   Problem: Self-Concept: Goal: Ability to disclose and discuss suicidal ideas will improve Outcome: Progressing Goal: Will verbalize positive feelings about self Outcome: Progressing

## 2020-02-28 NOTE — BHH Counselor (Signed)
CSW faxed FL2 for the patient to [name] to assist with placement.  Fax was successful.  CSW discussed with the patient pursing group homes and was recommended to continue pursuing group homes.  Assunta Curtis, MSW, LCSW 02/28/2020 1:18 PM

## 2020-02-28 NOTE — Progress Notes (Signed)
Patient stated that he is feeling bette. Denies SI,HI and AVH.Alexander KitchenAppropriate with staff & peers.Support an encouragement given. He appears to be resting in bed quietly.

## 2020-02-28 NOTE — Progress Notes (Signed)
[facility] MD Progress Note  02/28/2020 4:50 PM Alexander Beard  MRN:  [id number] Subjective: Patient seen chart reviewed.  Patient has no specific new complaints other than wanting to be discharged.  Mood is good.  No psychotic symptoms.  No anger no agitation.  He still has frequent enuresis but was cooperative with urology work-up. Principal Problem: Schizoaffective disorder, bipolar type ([place]) Diagnosis: Principal Problem:   Schizoaffective disorder, bipolar type ([place]) Active Problems:   Active autistic disorder   Diabetes ([place])   Hypertension   Prostate hypertrophy  Total Time spent with patient: 30 minutes  Past Psychiatric History: Past history of chronic illness and impairment related to developmental disability and diagnosis of schizoaffective disorder  Past Medical History:  Past Medical History:  Diagnosis Date  . Anxiety   . Anxiety disorder due to known physiological condition    HOSPITALIZED 10/18  . Arthritis   . Autistic disorder, residual state   . COPD (chronic obstructive pulmonary disease) ([place])   . Depression   . Developmental disorder   . Diabetes mellitus without complication ([place])   . Dyslipidemia   . Esophageal reflux   . HOH (hard of hearing)    MILDLY  . Hypertension   . Obesity   . Overactive bladder   . Palpitations    ANXIETY  . Schizophrenia, schizoaffective ([place])   . Sleep apnea   . Tremors of nervous system    HANDS DUE TO MEDICATIONS  . Urinary incontinence     Past Surgical History:  Procedure Laterality Date  . CATARACT EXTRACTION W/PHACO Left 11/14/2017   Procedure: CATARACT EXTRACTION PHACO AND INTRAOCULAR LENS PLACEMENT (IOC);  Surgeon: Birder Robson, MD;  Location: [facility] ORS;  Service: Ophthalmology;  Laterality: Left;  [place] 00:42 AP% 14.6 CDE 6.12 Fluid pack lot # [id number]  . COLONOSCOPY  01/28/2005  . COLONOSCOPY WITH PROPOFOL N/A 05/09/2016   Procedure: COLONOSCOPY WITH PROPOFOL;  Surgeon: Manya Silvas, MD;  Location: [facility]  ENDOSCOPY;  Service: Endoscopy;  Laterality: N/A;  . ESOPHAGOGASTRODUODENOSCOPY N/A 05/09/2016   Procedure: ESOPHAGOGASTRODUODENOSCOPY (EGD);  Surgeon: Manya Silvas, MD;  Location: [facility] ENDOSCOPY;  Service: Endoscopy;  Laterality: N/A;  . FRACTURE SURGERY     ORIF SHOULDER  . TONSILLECTOMY     Family History:  Family History  Problem Relation Age of Onset  . Hypertension Mother   . Stroke Father   . Heart Problems Father    Family Psychiatric  History: Unknown no information Social History:  Social History   Substance and Sexual Activity  Alcohol Use No  . Alcohol/week: 0.0 standard drinks     Social History   Substance and Sexual Activity  Drug Use No    Social History   Socioeconomic History  . Marital status: Single    Spouse name: Not on file  . Number of children: Not on file  . Years of education: Not on file  . Highest education level: Not on file  Occupational History  . Not on file  Tobacco Use  . Smoking status: Never Smoker  . Smokeless tobacco: Never Used  Vaping Use  . Vaping Use: Never used  Substance and Sexual Activity  . Alcohol use: No    Alcohol/week: 0.0 standard drinks  . Drug use: No  . Sexual activity: Never  Other Topics Concern  . Not on file  Social History Narrative   The patient never finished high school but did get his GED. 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 He has never been married and has no children. He is currently in disability and [name] is his legal guardian. He has been living in group homes for many years.      No pending legal charges   Social Determinants of Health   Financial Resource Strain:   . Difficulty of Paying Living Expenses: Not on file  Food Insecurity:   . Worried About Charity fundraiser in the Last Year: Not on file  . Ran Out of Food in the Last Year: Not on file  Transportation Needs:   . Lack of Transportation (Medical): Not on file  . Lack of Transportation  (Non-Medical): Not on file  Physical Activity:   . Days of Exercise per Week: Not on file  . Minutes of Exercise per Session: Not on file  Stress:   . Feeling of Stress : Not on file  Social Connections:   . Frequency of Communication with Friends and Family: Not on file  . Frequency of Social Gatherings with Friends and Family: Not on file  . Attends Religious Services: Not on file  . Active Member of Clubs or Organizations: Not on file  . Attends Archivist Meetings: Not on file  . Marital Status: Not on file   Additional Social History:                         Sleep: Fair  Appetite:  Fair  Current Medications: Current Facility-Administered Medications  Medication Dose Route Frequency Provider Last Rate Last Admin  . acetaminophen (TYLENOL) tablet 650 mg  650 mg Oral Q6H PRN [name], MD      . alum & mag hydroxide-simeth (MAALOX/MYLANTA) 200-200-20 MG/5ML suspension 30 mL  30 mL Oral Q4H PRN [name], MD      . aspirin EC tablet 81 mg  81 mg Oral Daily Aydrien Froman, [name], MD   81 mg at 02/28/20 0825  . clonazePAM (KLONOPIN) tablet 1 mg  1 mg Oral QID Nyana Haren, [name], MD   1 mg at 02/28/20 1234  . docusate sodium (COLACE) capsule 100 mg  100 mg Oral BID Annemarie Sebree, [name], MD   100 mg at 02/28/20 0826  . gabapentin (NEURONTIN) capsule 100 mg  100 mg Oral TID Nirvi Boehler T, MD   100 mg at 02/28/20 1234  . hydrOXYzine (ATARAX/VISTARIL) tablet 10 mg  10 mg Oral TID PRN [name], MD   10 mg at 02/20/20 2127  . lamoTRIgine (LAMICTAL) tablet 100 mg  100 mg Oral QHS Nazyia Gaugh T, MD   100 mg at 02/27/20 2211  . linagliptin (TRADJENTA) tablet 5 mg  5 mg Oral Daily Marquan Vokes, [name], MD   5 mg at 02/28/20 0825  . loratadine (CLARITIN) tablet 10 mg  10 mg Oral Daily Annaleigh Steinmeyer, [name], MD   10 mg at 02/28/20 0825  . magnesium hydroxide (MILK OF MAGNESIA) suspension 30 mL  30 mL Oral Daily PRN [name], MD      . metFORMIN (GLUCOPHAGE) tablet  850 mg  850 mg Oral BID WC Christen Wardrop, [name], MD   850 mg at 02/28/20 0826  . metoprolol succinate (TOPROL-XL) 24 hr tablet 50 mg  50 mg Oral Daily Tonnie Stillman, [name], MD   50 mg at 02/28/20 0825  . oxybutynin (DITROPAN) tablet 10 mg  10 mg Oral BID Lavalle Skoda, [name], MD   10 mg at 02/28/20 0825  .  pantoprazole (PROTONIX) EC tablet 40 mg  40 mg Oral Daily Kasie Leccese, [name], MD   40 mg at 02/28/20 0825  . QUEtiapine (SEROQUEL) tablet 100 mg  100 mg Oral QID Maddi Collar T, MD   100 mg at 02/28/20 1234  . QUEtiapine (SEROQUEL) tablet 400 mg  400 mg Oral QHS [name], NP   400 mg at 02/27/20 2211  . sertraline (ZOLOFT) tablet 100 mg  100 mg Oral BID Vedder Brittian, [name], MD   100 mg at 02/28/20 0825  . simvastatin (ZOCOR) tablet 20 mg  20 mg Oral q1800 Rudie Rikard, [name], MD   20 mg at 02/27/20 1711    Lab Results:  Results for orders placed or performed during the hospital encounter of 02/19/20 (from the past 48 hour(s))  Glucose, capillary     Status: None   Collection Time: 02/28/20  4:24 PM  Result Value Ref Range   Glucose-Capillary 76 70 - 99 mg/dL    Comment: Glucose reference range applies only to samples taken after fasting for at least 8 hours.    Blood Alcohol level:  Lab Results  Component Value Date   ETH <10 01/22/2020   ETH <10 85/63/1497    Metabolic Disorder Labs: Lab Results  Component Value Date   HGBA1C 6.6 (H) 02/20/2020   MPG 142.72 02/20/2020   No results found for: PROLACTIN Lab Results  Component Value Date   CHOL 153 04/10/2015   TRIG 166 (H) 04/10/2015   HDL 50 04/10/2015   CHOLHDL 3.1 04/10/2015   VLDL 33 04/10/2015   LDLCALC 70 04/10/2015    Physical Findings: AIMS:  , ,  ,  ,    CIWA:    COWS:     Musculoskeletal: Strength & Muscle Tone: within normal limits Gait & Station: normal Patient leans: N/A  Psychiatric Specialty Exam: Physical Exam Vitals and nursing note reviewed.  Constitutional:      Appearance: He is well-developed.  HENT:      Head: Normocephalic and atraumatic.  Eyes:     Conjunctiva/sclera: Conjunctivae normal.     Pupils: Pupils are equal, round, and reactive to light.  Cardiovascular:     Heart sounds: Normal heart sounds.  Pulmonary:     Effort: Pulmonary effort is normal.  Abdominal:     Palpations: Abdomen is soft.  Musculoskeletal:        General: Normal range of motion.     Cervical back: Normal range of motion.  Skin:    General: Skin is warm and dry.  Neurological:     General: No focal deficit present.     Mental Status: He is alert.  Psychiatric:        Attention and Perception: Attention normal.        Mood and Affect: Mood normal.        Speech: Speech is delayed.        Behavior: Behavior is cooperative.        Thought Content: Thought content normal.        Cognition and Memory: Cognition is impaired.        Judgment: Judgment normal.     Review of Systems  Constitutional: Negative.   HENT: Negative.   Eyes: Negative.   Respiratory: Negative.   Cardiovascular: Negative.   Gastrointestinal: Negative.   Musculoskeletal: Negative.   Skin: Negative.   Neurological: Negative.   Psychiatric/Behavioral: Negative.     Blood pressure 106/76, pulse 75, temperature 98.4 F (36.9 C),  temperature source Oral, resp. rate 18, height 5\' 8"  (1.727 m), weight 104.3 kg, SpO2 100 %.Body mass index is 34.96 kg/m.  General Appearance: Casual  Eye Contact:  Fair  Speech:  Clear and Coherent  Volume:  Normal  Mood:  Euthymic  Affect:  Congruent  Thought Process:  Goal Directed  Orientation:  Full (Time, Place, and Person)  Thought Content:  Logical  Suicidal Thoughts:  No  Homicidal Thoughts:  No  Memory:  Immediate;   Fair Recent;   Fair Remote;   Fair  Judgement:  Fair  Insight:  Fair  Psychomotor Activity:  Normal  Concentration:  Concentration: Fair  Recall:  [employer] of Knowledge:  Fair  Language:  Fair  Akathisia:  No  Handed:  Right  AIMS (if indicated):     Assets:   Desire for Improvement Resilience Social Support  ADL's:  Impaired  Cognition:  Impaired,  Mild  Sleep:  Number of Hours: 6.75     Treatment Plan Summary: Daily contact with patient to assess and evaluate symptoms and progress in treatment, Medication management and Plan From a mental health standpoint Alexander Beard is doing well.  He has settled into a good routine.  He is not showing any signs of psychosis as usual.  He is sleeping adequately.  Of course as usual his insight is somewhat limited in his concept when he should be discharged and what that would look like does not always match up with reality.  Tried to do some supportive counseling while reassuring him that the process was slow but we were working on his case.  I appreciate very much the assistance from urology today.  Please add or change any medications or treatments you want that would be appropriate to helping with his enuresis.  No other change to medicine for now.  Alethia Berthold, MD 02/28/2020, 4:50 PM

## 2020-02-29 NOTE — Progress Notes (Signed)
D: Pt alert and oriented. Pt denies experiencing any anxiety/depression at this time. Pt denies experiencing any pain at this time. Pt denies experiencing any SI/HI, or AVH at this time.   A: Scheduled medications administered to pt, per MD orders. Support and encouragement provided. Frequent verbal contact made. Routine safety checks conducted q15 minutes.   R: No adverse drug reactions noted. Pt verbally contracts for safety at this time. Pt complaint with medications and treatment plan. Pt interacts well with others on the unit. Pt remains safe at this time. Will continue to monitor.  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

## 2020-02-29 NOTE — Plan of Care (Signed)
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 Pt given education and support.   Problem: Education: Goal: Emotional status will improve Outcome: Not Progressing Goal: Mental status will improve Outcome: Not Progressing

## 2020-02-29 NOTE — [facility] Group Notes (Signed)
LCSW Group Therapy Note  02/29/2020   1:21 PM- 2:07 PM  Type of Therapy and Topic:  Group Therapy: Anger Cues and Responses  Participation Level:  Active   Description of Group:   In this group, patients learned how to recognize the physical, cognitive, emotional, and behavioral responses they have to anger-provoking situations.  They identified a recent time they became angry and how they reacted.  They analyzed how their reaction was possibly beneficial and how it was possibly unhelpful.  The group discussed a variety of healthier coping skills that could help with such a situation in the future.  Focus was placed on how helpful it is to recognize the underlying emotions to our anger, because working on those can lead to a more permanent solution as well as our ability to focus on the important rather than the urgent.  Therapeutic Goals: 1. Patients will remember their last incident of anger and how they felt emotionally and physically, what their thoughts were at the time, and how they behaved. 2. Patients will identify how their behavior at that time worked for them, as well as how it worked against them. 3. Patients will explore possible new behaviors to use in future anger situations. 4. Patients will learn that anger itself is normal and cannot be eliminated, and that healthier reactions can assist with resolving conflict rather than worsening situations.  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 Patient stated what makes him angry is when he thinks about the things he loved to do and getting back to it. Patient stated he would like to adjust better. Patient was less interactive then usual and dozed off during group at times. Patient thanked CSW after group and stated that coming to group helps him cope and makes him feel better.     Therapeutic Modalities:   Cognitive Behavioral  Therapy    [name], Latanya Presser 02/29/2020  3:34 PM

## 2020-02-29 NOTE — Progress Notes (Signed)
[facility] MD Progress Note  02/29/2020 2:36 PM STROTHER EVERITT  MRN:  [id number] Subjective:   Principal Problem: Schizoaffective disorder, bipolar type ([place]) Diagnosis: Principal Problem:   Schizoaffective disorder, bipolar type ([place]) Active Problems:   Active autistic disorder   Diabetes ([place])   Hypertension   Prostate hypertrophy  Total Time spent with patient: 25-30   Past Psychiatric History: ---already discussed     Past Medical History:  Past Medical History:  Diagnosis Date  . Anxiety   . Anxiety disorder due to known physiological condition    HOSPITALIZED 10/18  . Arthritis   . Autistic disorder, residual state   . COPD (chronic obstructive pulmonary disease) ([place])   . Depression   . Developmental disorder   . Diabetes mellitus without complication ([place])   . Dyslipidemia   . Esophageal reflux   . HOH (hard of hearing)    MILDLY  . Hypertension   . Obesity   . Overactive bladder   . Palpitations    ANXIETY  . Schizophrenia, schizoaffective ([place])   . Sleep apnea   . Tremors of nervous system    HANDS DUE TO MEDICATIONS  . Urinary incontinence     Past Surgical History:  Procedure Laterality Date  . CATARACT EXTRACTION W/PHACO Left 11/14/2017   Procedure: CATARACT EXTRACTION PHACO AND INTRAOCULAR LENS PLACEMENT (IOC);  Surgeon: Birder Robson, MD;  Location: [facility] ORS;  Service: Ophthalmology;  Laterality: Left;  [place] 00:42 AP% 14.6 CDE 6.12 Fluid pack lot # [id number]  . COLONOSCOPY  01/28/2005  . COLONOSCOPY WITH PROPOFOL N/A 05/09/2016   Procedure: COLONOSCOPY WITH PROPOFOL;  Surgeon: Manya Silvas, MD;  Location: [facility] ENDOSCOPY;  Service: Endoscopy;  Laterality: N/A;  . ESOPHAGOGASTRODUODENOSCOPY N/A 05/09/2016   Procedure: ESOPHAGOGASTRODUODENOSCOPY (EGD);  Surgeon: Manya Silvas, MD;  Location: [facility] ENDOSCOPY;  Service: Endoscopy;  Laterality: N/A;  . FRACTURE SURGERY     ORIF SHOULDER  . TONSILLECTOMY     Family History:  Family History   Problem Relation Age of Onset  . Hypertension Mother   . Stroke Father   . Heart Problems Father    Family Psychiatric  History:  Already okay Social History:  Social History   Substance and Sexual Activity  Alcohol Use No  . Alcohol/week: 0.0 standard drinks     Social History   Substance and Sexual Activity  Drug Use No    Social History   Socioeconomic History  . Marital status: Single    Spouse name: Not on file  . Number of children: Not on file  . Years of education: Not on file  . Highest education level: Not on file  Occupational History  . Not on file  Tobacco Use  . Smoking status: Never Smoker  . Smokeless tobacco: Never Used  Vaping Use  . Vaping Use: Never used  Substance and Sexual Activity  . Alcohol use: No    Alcohol/week: 0.0 standard drinks  . Drug use: No  . Sexual activity: Never  Other Topics Concern  . Not on file  Social History Narrative   The patient never finished high school but did get his GED. 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 He is currently in disability and [name] is his legal guardian. He has been living in group homes for many years.      No pending legal charges   Social Determinants of Health  Financial Resource Strain:   . Difficulty of Paying Living Expenses: Not on file  Food Insecurity:   . Worried About Charity fundraiser in the Last Year: Not on file  . Ran Out of Food in the Last Year: Not on file  Transportation Needs:   . Lack of Transportation (Medical): Not on file  . Lack of Transportation (Non-Medical): Not on file  Physical Activity:   . Days of Exercise per Week: Not on file  . Minutes of Exercise per Session: Not on file  Stress:   . Feeling of Stress : Not on file  Social Connections:   . Frequency of Communication with Friends and Family: Not on file  . Frequency of Social Gatherings with Friends and Family: Not on file   . Attends Religious Services: Not on file  . Active Member of Clubs or Organizations: Not on file  . Attends Archivist Meetings: Not on file  . Marital Status: Not on file   Additional Social History:   He is feeling okay supportive statements generally made ----no other new changes He says things are gradually improving   No other new change he says                           Sleep:  Okay   Appetite:   okay  Current Medications: Current Facility-Administered Medications  Medication Dose Route Frequency Provider Last Rate Last Admin  . acetaminophen (TYLENOL) tablet 650 mg  650 mg Oral Q6H PRN [name], MD      . alum & mag hydroxide-simeth (MAALOX/MYLANTA) 200-200-20 MG/5ML suspension 30 mL  30 mL Oral Q4H PRN [name], MD      . aspirin EC tablet 81 mg  81 mg Oral Daily [name], [name], MD   81 mg at 02/29/20 0830  . clonazePAM (KLONOPIN) tablet 1 mg  1 mg Oral QID [name], [name], MD   1 mg at 02/29/20 1137  . docusate sodium (COLACE) capsule 100 mg  100 mg Oral BID [name], [name], MD   100 mg at 02/29/20 0831  . gabapentin (NEURONTIN) capsule 100 mg  100 mg Oral TID [name], [name], MD   100 mg at 02/29/20 1137  . hydrOXYzine (ATARAX/VISTARIL) tablet 10 mg  10 mg Oral TID PRN [name], MD   10 mg at 02/20/20 2127  . lamoTRIgine (LAMICTAL) tablet 100 mg  100 mg Oral QHS [name], [name], MD   100 mg at 02/28/20 2125  . linagliptin (TRADJENTA) tablet 5 mg  5 mg Oral Daily [name], [name], MD   5 mg at 02/29/20 0831  . loratadine (CLARITIN) tablet 10 mg  10 mg Oral Daily [name], [name], MD   10 mg at 02/29/20 0831  . magnesium hydroxide (MILK OF MAGNESIA) suspension 30 mL  30 mL Oral Daily PRN [name], MD      . metFORMIN (GLUCOPHAGE) tablet 850 mg  850 mg Oral BID WC [name], [name], MD   850 mg at 02/29/20 0849  . metoprolol succinate (TOPROL-XL) 24 hr tablet 50 mg  50 mg Oral Daily [name], [name], MD   50 mg at 02/29/20  0831  . oxybutynin (DITROPAN) tablet 10 mg  10 mg Oral BID [name], [name], MD   10 mg at 02/29/20 3810  . pantoprazole (PROTONIX) EC tablet 40 mg  40 mg Oral Daily [name], [name], MD  40 mg at 02/29/20 0831  . QUEtiapine (SEROQUEL) tablet 100 mg  100 mg Oral QID [name], [name], MD   100 mg at 02/29/20 1137  . QUEtiapine (SEROQUEL) tablet 400 mg  400 mg Oral QHS [name], NP   400 mg at 02/28/20 2125  . sertraline (ZOLOFT) tablet 100 mg  100 mg Oral BID [name], [name], MD   100 mg at 02/29/20 0831  . simvastatin (ZOCOR) tablet 20 mg  20 mg Oral q1800 [name], [name], MD   20 mg at 02/28/20 1737    Lab Results:  Results for orders placed or performed during the hospital encounter of 02/19/20 (from the past 48 hour(s))  Glucose, capillary     Status: None   Collection Time: 02/28/20  4:24 PM  Result Value Ref Range   Glucose-Capillary 76 70 - 99 mg/dL    Comment: Glucose reference range applies only to samples taken after fasting for at least 8 hours.    Blood Alcohol level:  Lab Results  Component Value Date   ETH <10 01/22/2020   ETH <10 65/78/4696    Metabolic Disorder Labs: Lab Results  Component Value Date   HGBA1C 6.6 (H) 02/20/2020   MPG 142.72 02/20/2020   No results found for: PROLACTIN Lab Results  Component Value Date   CHOL 153 04/10/2015   TRIG 166 (H) 04/10/2015   HDL 50 04/10/2015   CHOLHDL 3.1 04/10/2015   VLDL 33 04/10/2015   LDLCALC 70 04/10/2015    Physical Findings: AIMS:  , ,  ,  ,   not done yet  CIWA:    COWS:     Musculoskeletal: Strength & Muscle Tone: normal  Gait & Station: normal  Patient leans: n/a   Akathisia none ADL's okay Recall okay Cognition improving Assets ---more reliable Sleep improving Psychomotor normal  Aims not done  Handedness not known leans not known  Oriented to person place date and time Consciousness not clouded or fluctuant Concentration and attention normal Thought process and content  improving Speech normal rate tone volume fluency Judgement insight reliability improving No active HI SI or plans Abstraction okay Memory remote recent immediate no change Fund of knowledge intelligence normal  No tics shakes tremors  Appearance and rapport norma Eye contact okay              Psychiatric Specialty Exam: Physical Exam  Review of Systems  Blood pressure (!) 128/100, pulse 64, temperature 97.9 F (36.6 C), temperature source Oral, resp. rate 18, height 5\' 8"  (1.727 m), weight 104.3 kg, SpO2 100 %.Body mass index is 34.96 kg/m.                                                         Treatment Plan Summary:  Continues in milieu pending discharge plan and stablization  [name], MD 02/29/2020, 2:36 PM

## 2020-02-29 NOTE — Progress Notes (Signed)
Patient less agitated this evening compared to last night. Patient denies SI/HI/AVH and pain. Patient stated he feels the best tonight that he has in a long time. Patient compliant with medication administration per MD orders. Patient given education, support, and encouragement to be active in his treatment plan. Patient being monitored Q 15 minutes for safety per unit protocol. Patient remains safe on the unit. Patient presents with mild confusion but is easily redirectable. 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 Patient given education and support.

## 2020-03-01 NOTE — Plan of Care (Signed)
D: Alexander Beard presents in a pleasant mood. Denies SI/HI/AVH. States he slept good last night. He does not endorse anxiety, depression or hopelessness, rates all as 0/10 on his patient self inventory sheet for today. Verbalized his goal for today is to" stay busy and keep going on". Verbally contracts for safety.   A: Encouragement, support and reassurance provided. Maintained unit 15 minute safety checks. Instructed to notify staff of any concerns or changes.  R: Pt. Compliant with medications. Remains safe at this time.   Problem: Education: Goal: Knowledge of [facility] Education information/materials will improve Outcome: Progressing Goal: Emotional status will improve 03/01/2020 1543 by Zenovia Jarred, RN Outcome: Progressing 03/01/2020 1234 by Zenovia Jarred, RN Outcome: Progressing Goal: Mental status will improve 03/01/2020 1543 by Zenovia Jarred, RN Outcome: Progressing 03/01/2020 1234 by Zenovia Jarred, RN Outcome: Progressing Goal: Verbalization of understanding the information provided will improve Outcome: Progressing   Problem: Activity: Goal: Interest or engagement in activities will improve Outcome: Progressing Goal: Sleeping patterns will improve Outcome: Progressing   Problem: Coping: Goal: Ability to verbalize frustrations and anger appropriately will improve Outcome: Progressing Goal: Ability to demonstrate self-control will improve Outcome: Progressing   Problem: Health Behavior/Discharge Planning: Goal: Identification of resources available to assist in meeting health care needs will improve Outcome: Progressing Goal: Compliance with treatment plan for underlying cause of condition will improve Outcome: Progressing   Problem: Physical Regulation: Goal: Ability to maintain clinical measurements within normal limits will improve Outcome: Progressing   Problem: Safety: Goal: Periods of time without injury will increase Outcome: Progressing    Problem: Education: Goal: Ability to make informed decisions regarding treatment will improve Outcome: Progressing   Problem: Coping: Goal: Coping ability will improve Outcome: Progressing   Problem: Health Behavior/Discharge Planning: Goal: Identification of resources available to assist in meeting health care needs will improve Outcome: Progressing   Problem: Medication: Goal: Compliance with prescribed medication regimen will improve Outcome: Progressing   Problem: Self-Concept: Goal: Ability to disclose and discuss suicidal ideas will improve Outcome: Progressing Goal: Will verbalize positive feelings about self Outcome: Progressing

## 2020-03-01 NOTE — [facility] Group Notes (Signed)
[place] Group Notes:  (Nursing/MHT/Case Management/Adjunct)  Date:  03/01/2020  Time:  9:10 PM  Type of Therapy:  Group Therapy  Participation Level:  Active  Participation Quality:Sharing    Affect:  Appropriate  Cognitive:  Alert and asking to repeat what was said  Insight:  Improving  Engagement in Group:  Engaged and goal was to stay busy and he feeling good.  Modes of Intervention:  Support  Summary of Progress/Problems:  Alexander Beard 03/01/2020, 9:10 PM

## 2020-03-01 NOTE — Tx Team (Signed)
Interdisciplinary Treatment and Diagnostic Plan Update  03/01/2020 Time of Session: 11:30 AM FAVOR HACKLER MRN: [id number]  Principal Diagnosis: Schizoaffective disorder, bipolar type [facility])  Secondary Diagnoses: Principal Problem:   Schizoaffective disorder, bipolar type ([place]) Active Problems:   Active autistic disorder   Diabetes ([place])   Hypertension   Prostate hypertrophy   Current Medications:  Current Facility-Administered Medications  Medication Dose Route Frequency Provider Last Rate Last Admin  . acetaminophen (TYLENOL) tablet 650 mg  650 mg Oral Q6H PRN [name], MD      . alum & mag hydroxide-simeth (MAALOX/MYLANTA) 200-200-20 MG/5ML suspension 30 mL  30 mL Oral Q4H PRN [name], MD      . aspirin EC tablet 81 mg  81 mg Oral Daily [name], [name], MD   81 mg at 03/01/20 0909  . clonazePAM (KLONOPIN) tablet 1 mg  1 mg Oral QID [name], [name], MD   1 mg at 03/01/20 1242  . docusate sodium (COLACE) capsule 100 mg  100 mg Oral BID [name], [name], MD   100 mg at 03/01/20 0909  . gabapentin (NEURONTIN) capsule 100 mg  100 mg Oral TID [name], [name], MD   100 mg at 03/01/20 1242  . hydrOXYzine (ATARAX/VISTARIL) tablet 10 mg  10 mg Oral TID PRN [name], MD   10 mg at 02/20/20 2127  . lamoTRIgine (LAMICTAL) tablet 100 mg  100 mg Oral QHS [name], [name], MD   100 mg at 02/29/20 2138  . linagliptin (TRADJENTA) tablet 5 mg  5 mg Oral Daily [name], [name], MD   5 mg at 03/01/20 0908  . loratadine (CLARITIN) tablet 10 mg  10 mg Oral Daily [name], [name], MD   10 mg at 03/01/20 0909  . magnesium hydroxide (MILK OF MAGNESIA) suspension 30 mL  30 mL Oral Daily PRN [name], MD      . metFORMIN (GLUCOPHAGE) tablet 850 mg  850 mg Oral BID WC [name], [name], MD   850 mg at 03/01/20 0909  . metoprolol succinate (TOPROL-XL) 24 hr tablet 50 mg  50 mg Oral Daily [name], [name], MD   50 mg at 03/01/20 0909  . oxybutynin (DITROPAN) tablet 10 mg  10 mg Oral BID  [name], [name], MD   10 mg at 03/01/20 0911  . pantoprazole (PROTONIX) EC tablet 40 mg  40 mg Oral Daily [name], [name], MD   40 mg at 03/01/20 0909  . QUEtiapine (SEROQUEL) tablet 100 mg  100 mg Oral QID [name], [name], MD   100 mg at 03/01/20 1242  . QUEtiapine (SEROQUEL) tablet 400 mg  400 mg Oral QHS [name], NP   400 mg at 02/29/20 2138  . sertraline (ZOLOFT) tablet 100 mg  100 mg Oral BID [name], [name], MD   100 mg at 03/01/20 0909  . simvastatin (ZOCOR) tablet 20 mg  20 mg Oral q1800 [name], [name], MD   20 mg at 02/29/20 1732   PTA Medications: Medications Prior to Admission  Medication Sig Dispense Refill Last Dose  . acetaminophen (TYLENOL) 650 MG CR tablet Take 650 mg by mouth every 12 (twelve) hours as needed for pain.     Marland Kitchen aspirin EC 81 MG tablet Take 81 mg by mouth daily.     . Calcium Carbonate-Vitamin D3 (CALCIUM 600-D) 600-400 MG-UNIT TABS Take 1 tablet by mouth 2 (two) times daily.     . Cholecalciferol (VITAMIN D-1000 MAX ST)  1000 UNITS tablet Take 1,000 Units by mouth daily.      Marland Kitchen docusate sodium (COLACE) 100 MG capsule Take 100 mg by mouth daily.      . furosemide (LASIX) 20 MG tablet Take 20 mg by mouth daily.      Marland Kitchen gabapentin (NEURONTIN) 100 MG capsule Take 1 capsule (100 mg total) by mouth 3 (three) times daily. 90 capsule 10   . Insulin Detemir (LEVEMIR FLEXTOUCH) 100 UNIT/ML Pen Inject 15 Units into the skin daily at 10 pm.     . lamoTRIgine (LAMICTAL) 100 MG tablet Take 1 tablet (100 mg total) by mouth at bedtime. 30 tablet 10   . loratadine (CLARITIN) 10 MG tablet Take 1 tablet (10 mg total) by mouth daily. 30 tablet 5   . LORazepam (ATIVAN) 1 MG tablet Take 1 tablet (1 mg total) by mouth 3 (three) times daily. 90 tablet 5   . lurasidone (LATUDA) 40 MG TABS tablet Take 1 tablet (40 mg total) by mouth daily. with food 30 tablet 10   . metoprolol succinate (TOPROL-XL) 50 MG 24 hr tablet Take 50 mg by mouth daily.      . NON FORMULARY cpap device      . oxybutynin (DITROPAN) 5 MG tablet Take 5 mg by mouth daily.     . pantoprazole (PROTONIX) 40 MG tablet Take 40 mg by mouth daily.     . polyethylene glycol (MIRALAX / GLYCOLAX) packet Take 17 g by mouth daily.     . QUEtiapine (SEROQUEL) 100 MG tablet TAKE 1 TABLET  BY MOUTH THREE TIMES A DAY (Patient taking differently: Take 100 mg by mouth 3 (three) times daily. TAKE 1 TABLET  BY MOUTH THREE TIMES A DAY) 90 tablet 11   . QUEtiapine (SEROQUEL) 400 MG tablet Take 1 tablet (400 mg total) by mouth at bedtime. 30 tablet 10   . sertraline (ZOLOFT) 100 MG tablet Take 2 tablets (200 mg total) by mouth daily. (Patient taking differently: Take 200 mg by mouth daily. Take along with two 50 mg tablets (100 mg) for total 300 mg daily) 60 tablet 5   . sertraline (ZOLOFT) 50 MG tablet Take 2 tablets (100 mg total) by mouth daily. (Patient taking differently: Take 100 mg by mouth daily. Take along with two 100 mg tablets (200 mg) for total 300 mg daily) 60 tablet 5   . simethicone (MYLICON) 80 MG chewable tablet Chew 80-160 mg by mouth 2 (two) times daily as needed for flatulence.     . simvastatin (ZOCOR) 20 MG tablet Take 20 mg by mouth at bedtime.      . sitaGLIPtin (JANUVIA) 100 MG tablet Take 100 mg by mouth daily.     . solifenacin (VESICARE) 5 MG tablet Take 5 mg by mouth daily.     . Starch (HEMORRHOIDAL RE) Place 1 application rectally 4 (four) times daily as needed (for itching).     . tamsulosin (FLOMAX) 0.4 MG CAPS capsule Take 0.4 mg by mouth daily.       Patient Stressors: Health problems Marital or family conflict Medication change or noncompliance Traumatic event  Patient Strengths: Motivation for treatment/growth Religious Affiliation  Treatment Modalities: Medication Management, Group therapy, Case management,  1 to 1 session with clinician, Psychoeducation, Recreational therapy.   Physician Treatment Plan for Primary Diagnosis: Schizoaffective disorder, bipolar type ([place]) Long  Term Goal(s): Improvement in symptoms so as ready for discharge Improvement in symptoms so as ready for discharge   Short  Term Goals: Ability to verbalize feelings will improve Ability to demonstrate self-control will improve Ability to maintain clinical measurements within normal limits will improve Compliance with prescribed medications will improve  Medication Management: Evaluate patient's response, side effects, and tolerance of medication regimen.  Therapeutic Interventions: 1 to 1 sessions, Unit Group sessions and Medication administration.  Evaluation of Outcomes: Progressing  Physician Treatment Plan for Secondary Diagnosis: Principal Problem:   Schizoaffective disorder, bipolar type ([place]) Active Problems:   Active autistic disorder   Diabetes ([place])   Hypertension   Prostate hypertrophy  Long Term Goal(s): Improvement in symptoms so as ready for discharge Improvement in symptoms so as ready for discharge   Short Term Goals: Ability to verbalize feelings will improve Ability to demonstrate self-control will improve Ability to maintain clinical measurements within normal limits will improve Compliance with prescribed medications will improve     Medication Management: Evaluate patient's response, side effects, and tolerance of medication regimen.  Therapeutic Interventions: 1 to 1 sessions, Unit Group sessions and Medication administration.  Evaluation of Outcomes: Progressing   RN Treatment Plan for Primary Diagnosis: Schizoaffective disorder, bipolar type ([place]) Long Term Goal(s): Knowledge of disease and therapeutic regimen to maintain health will improve  Short Term Goals: Ability to demonstrate self-control, Ability to participate in decision making will improve, Ability to verbalize feelings will improve, Ability to disclose and discuss suicidal ideas, Ability to identify and develop effective coping behaviors will improve and Compliance with prescribed medications  will improve  Medication Management: RN will administer medications as ordered by provider, will assess and evaluate patient's response and provide education to patient for prescribed medication. RN will report any adverse and/or side effects to prescribing provider.  Therapeutic Interventions: 1 on 1 counseling sessions, Psychoeducation, Medication administration, Evaluate responses to treatment, Monitor vital signs and CBGs as ordered, Perform/monitor CIWA, COWS, AIMS and Fall Risk screenings as ordered, Perform wound care treatments as ordered.  Evaluation of Outcomes: Progressing   LCSW Treatment Plan for Primary Diagnosis: Schizoaffective disorder, bipolar type ([place]) Long Term Goal(s): Safe transition to appropriate next level of care at discharge, Engage patient in therapeutic group addressing interpersonal concerns.  Short Term Goals: Engage patient in aftercare planning with referrals and resources, Increase social support, Increase ability to appropriately verbalize feelings, Increase emotional regulation and Increase skills for wellness and recovery  Therapeutic Interventions: Assess for all discharge needs, 1 to 1 time with Social worker, Explore available resources and support systems, Assess for adequacy in community support network, Educate family and significant other(s) on suicide prevention, Complete Psychosocial Assessment, Interpersonal group therapy.  Evaluation of Outcomes: Progressing   Progress in Treatment: Attending groups: Yes. Participating in groups: Yes. Taking medication as prescribed: Yes. Toleration medication: Yes. Family/Significant other contact made: Yes, individual(s) contacted:  [place], Brother  Patient understands diagnosis: Yes. Discussing patient identified problems/goals with staff: Yes. Medical problems stabilized or resolved: Yes. Denies suicidal/homicidal ideation: Yes. Issues/concerns per patient self-inventory: No. Other:   New  problem(s) identified: No, Describe:  None  New Short Term/Long Term Goal(s): Elimination of symptoms of psychosis, medication management for mood stabilization; elimination of SI thoughts; development of comprehensive mental wellness/sobriety plan. Update 03/01/20: No changes at this time.    Patient Goals: Patient stated he would like to go back to [place] and reconnect with his peer support. Patient also stated that he would like to increase his social support and help his brother.   Discharge Plan or Barriers: CSW will assist the patient in developing  appropriate aftercare plans.  Update 03/01/2020: No changes at this time.   Reason for Continuation of Hospitalization: Anxiety Depression Medication stabilization Suicidal ideation  Estimated Length of Stay: TBD  Attendees: Patient: 03/01/2020 2:55 PM  Physician: Dr. Hedwig Morton, MD 03/01/2020 2:55 PM  Nursing: Lyda Kalata, RN 03/01/2020 2:55 PM  RN Care Manager: 03/01/2020 2:55 PM  Social Worker: [name], [place] 03/01/2020 2:55 PM  Recreational Therapist:  03/01/2020 2:55 PM  Other:  03/01/2020 2:55 PM  Other:  03/01/2020 2:55 PM  Other: 03/01/2020 2:55 PM    Scribe for Treatment Team: [name], [place] 03/01/2020 2:55 PM

## 2020-03-01 NOTE — [facility] Group Notes (Signed)
[place] LCSW Group Therapy Note  Date/Time:  03/01/2020 1:20 PM- 2:01 PM  Type of Therapy and Topic:  Group Therapy:  Healthy and Unhealthy Supports  Participation Level:  Active   Description of Group:  Patients in this group were introduced to the idea of adding a variety of healthy supports to address the various needs in their lives.Patients discussed what additional healthy supports could be helpful in their recovery and wellness after discharge in order to prevent future hospitalizations.   An emphasis was placed on using counselor, doctor, therapy groups, 12-step groups, and problem-specific support groups to expand supports.  They also worked as a group on developing a specific plan for several patients to deal with unhealthy supports through [place], psychoeducation with loved ones, and even termination of relationships.   Therapeutic Goals:   1)  discuss importance of adding supports to stay well once out of the hospital  2)  compare healthy versus unhealthy supports and identify some examples of each  3)  generate ideas and descriptions of healthy supports that can be added  4)  offer mutual support about how to address unhealthy supports  5)  encourage active participation in and adherence to discharge plan    Summary of Patient Progress:  Patient came into group at the end. Patient spoke about going back to [place] and adding his brother as a support when he is discharged from the hospital. Patient also wanted a copy of support ideas he could have to add to his journal.   Therapeutic Modalities:   Motivational Interviewing Brief Solution-Focused Therapy  [name], Latanya Presser 03/01/2020  3:05 PM
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
## 2020-03-02 MED ORDER — TAMSULOSIN HCL 0.4 MG PO CAPS
0.4000 mg | ORAL_CAPSULE | Freq: Every day | ORAL | Status: DC
  Administered 2020-03-03 – 2020-03-04 (×2): 0.4 mg via ORAL
  Filled 2020-03-02 (×4): qty 1

## 2020-03-02 NOTE — Progress Notes (Signed)
[facility] - [facility] MD Progress Note  03/02/2020 3:03 PM ANCIL DEWAN  MRN:  [id number] Subjective: Follow-up for this gentleman with developmental disability and schizoaffective disorder.  Patient seen chart reviewed.  He is getting along fine on the unit.  No behavior problems.  He comes in to talk with me a very anxious that he wants to follow-up about getting peer support again and he also wants me to "study" a note that he gives me that basically list the same concerns he always has.  Nothing agitated or violent but as usual his insight is partial at best.  Lots of reassurance done.  Seems to be possibly some improvement in his enuresis although his Flomax had not yet been restarted. Principal Problem: Schizoaffective disorder, bipolar type ([place]) Diagnosis: Principal Problem:   Schizoaffective disorder, bipolar type ([place]) Active Problems:   Active autistic disorder   Diabetes ([place])   Hypertension   Prostate hypertrophy  Total Time spent with patient: 30 minutes  Past Psychiatric History: Long time history of chronic illness  Past Medical History:  Past Medical History:  Diagnosis Date  . Anxiety   . Anxiety disorder due to known physiological condition    HOSPITALIZED 10/18  . Arthritis   . Autistic disorder, residual state   . COPD (chronic obstructive pulmonary disease) ([place])   . Depression   . Developmental disorder   . Diabetes mellitus without complication ([place])   . Dyslipidemia   . Esophageal reflux   . HOH (hard of hearing)    MILDLY  . Hypertension   . Obesity   . Overactive bladder   . Palpitations    ANXIETY  . Schizophrenia, schizoaffective ([place])   . Sleep apnea   . Tremors of nervous system    HANDS DUE TO MEDICATIONS  . Urinary incontinence     Past Surgical History:  Procedure Laterality Date  . CATARACT EXTRACTION W/PHACO Left 11/14/2017   Procedure: CATARACT EXTRACTION PHACO AND INTRAOCULAR LENS PLACEMENT (IOC);  Surgeon: Birder Robson, MD;  Location: [facility] ORS;   Service: Ophthalmology;  Laterality: Left;  [place] 00:42 AP% 14.6 CDE 6.12 Fluid pack lot # [id number]  . COLONOSCOPY  01/28/2005  . COLONOSCOPY WITH PROPOFOL N/A 05/09/2016   Procedure: COLONOSCOPY WITH PROPOFOL;  Surgeon: Manya Silvas, MD;  Location: [facility] ENDOSCOPY;  Service: Endoscopy;  Laterality: N/A;  . ESOPHAGOGASTRODUODENOSCOPY N/A 05/09/2016   Procedure: ESOPHAGOGASTRODUODENOSCOPY (EGD);  Surgeon: Manya Silvas, MD;  Location: [facility] ENDOSCOPY;  Service: Endoscopy;  Laterality: N/A;  . FRACTURE SURGERY     ORIF SHOULDER  . TONSILLECTOMY     Family History:  Family History  Problem Relation Age of Onset  . Hypertension Mother   . Stroke Father   . Heart Problems Father    Family Psychiatric  History: See previous Social History:  Social History   Substance and Sexual Activity  Alcohol Use No  . Alcohol/week: 0.0 standard drinks     Social History   Substance and Sexual Activity  Drug Use No    Social History   Socioeconomic History  . Marital status: Single    Spouse name: Not on file  . Number of children: Not on file  . Years of education: Not on file  . Highest education level: Not on file  Occupational History  . Not on file  Tobacco Use  . Smoking status: Never Smoker  . Smokeless tobacco: Never Used  Vaping Use  . Vaping Use: Never used  Substance and Sexual Activity  .  Alcohol use: No    Alcohol/week: 0.0 standard drinks  . Drug use: No  . Sexual activity: Never  Other Topics Concern  . Not on file  Social History Narrative   The patient never finished high school but did get his GED. 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 He is currently in disability and [name] is his legal guardian. He has been living in group homes for many years.      No pending legal charges   Social Determinants of Health   Financial Resource Strain:   . Difficulty of Paying Living Expenses:  Not on file  Food Insecurity:   . Worried About Charity fundraiser in the Last Year: Not on file  . Ran Out of Food in the Last Year: Not on file  Transportation Needs:   . Lack of Transportation (Medical): Not on file  . Lack of Transportation (Non-Medical): Not on file  Physical Activity:   . Days of Exercise per Week: Not on file  . Minutes of Exercise per Session: Not on file  Stress:   . Feeling of Stress : Not on file  Social Connections:   . Frequency of Communication with Friends and Family: Not on file  . Frequency of Social Gatherings with Friends and Family: Not on file  . Attends Religious Services: Not on file  . Active Member of Clubs or Organizations: Not on file  . Attends Archivist Meetings: Not on file  . Marital Status: Not on file   Additional Social History:                         Sleep: Fair  Appetite:  Fair  Current Medications: Current Facility-Administered Medications  Medication Dose Route Frequency Provider Last Rate Last Admin  . acetaminophen (TYLENOL) tablet 650 mg  650 mg Oral Q6H PRN [name], MD      . alum & mag hydroxide-simeth (MAALOX/MYLANTA) 200-200-20 MG/5ML suspension 30 mL  30 mL Oral Q4H PRN [name], MD      . aspirin EC tablet 81 mg  81 mg Oral Daily Akirah Storck, [name], MD   81 mg at 03/02/20 1123  . clonazePAM (KLONOPIN) tablet 1 mg  1 mg Oral QID Joyel Chenette, [name], MD   1 mg at 03/02/20 1123  . docusate sodium (COLACE) capsule 100 mg  100 mg Oral BID Rufus Beske, [name], MD   100 mg at 03/02/20 1123  . gabapentin (NEURONTIN) capsule 100 mg  100 mg Oral TID Mohammed Mcandrew T, MD   100 mg at 03/02/20 1123  . hydrOXYzine (ATARAX/VISTARIL) tablet 10 mg  10 mg Oral TID PRN [name], MD   10 mg at 02/20/20 2127  . lamoTRIgine (LAMICTAL) tablet 100 mg  100 mg Oral QHS Idris Edmundson, [name], MD   100 mg at 03/01/20 2139  . linagliptin (TRADJENTA) tablet 5 mg  5 mg Oral Daily Normal Recinos, [name], MD   5 mg at  03/02/20 1123  . loratadine (CLARITIN) tablet 10 mg  10 mg Oral Daily Darly Massi, [name], MD   10 mg at 03/02/20 1119  . magnesium hydroxide (MILK OF MAGNESIA) suspension 30 mL  30 mL Oral Daily PRN [name], MD      . metFORMIN (GLUCOPHAGE) tablet 850 mg  850 mg Oral BID WC Lorah Kalina, [name], MD   850  mg at 03/02/20 1119  . metoprolol succinate (TOPROL-XL) 24 hr tablet 50 mg  50 mg Oral Daily Rifka Ramey T, MD   50 mg at 03/02/20 1120  . oxybutynin (DITROPAN) tablet 10 mg  10 mg Oral BID Alpha Mysliwiec, [name], MD   10 mg at 03/02/20 1119  . pantoprazole (PROTONIX) EC tablet 40 mg  40 mg Oral Daily Jaceion Aday, [name], MD   40 mg at 03/02/20 1124  . QUEtiapine (SEROQUEL) tablet 100 mg  100 mg Oral QID Tunisia Landgrebe T, MD   100 mg at 03/02/20 1122  . QUEtiapine (SEROQUEL) tablet 400 mg  400 mg Oral QHS [name], NP   400 mg at 03/01/20 2140  . sertraline (ZOLOFT) tablet 100 mg  100 mg Oral BID Krishay Faro T, MD   100 mg at 03/02/20 1120  . simvastatin (ZOCOR) tablet 20 mg  20 mg Oral q1800 Lavi Sheehan, [name], MD   20 mg at 03/01/20 1747  . tamsulosin (FLOMAX) capsule 0.4 mg  0.4 mg Oral QPC supper Treyvone Chelf, [name], MD        Lab Results: No results found for this or any previous visit (from the past 48 hour(s)).  Blood Alcohol level:  Lab Results  Component Value Date   ETH <10 01/22/2020   ETH <10 70/62/3762    Metabolic Disorder Labs: Lab Results  Component Value Date   HGBA1C 6.6 (H) 02/20/2020   MPG 142.72 02/20/2020   No results found for: PROLACTIN Lab Results  Component Value Date   CHOL 153 04/10/2015   TRIG 166 (H) 04/10/2015   HDL 50 04/10/2015   CHOLHDL 3.1 04/10/2015   VLDL 33 04/10/2015   LDLCALC 70 04/10/2015    Physical Findings: AIMS:  , ,  ,  ,    CIWA:    COWS:     Musculoskeletal: Strength & Muscle Tone: within normal limits Gait & Station: normal Patient leans: N/A  Psychiatric Specialty Exam: Physical Exam Vitals and nursing note reviewed.   Constitutional:      Appearance: He is well-developed.  HENT:     Head: Normocephalic and atraumatic.  Eyes:     Conjunctiva/sclera: Conjunctivae normal.     Pupils: Pupils are equal, round, and reactive to light.  Cardiovascular:     Heart sounds: Normal heart sounds.  Pulmonary:     Effort: Pulmonary effort is normal.  Abdominal:     Palpations: Abdomen is soft.  Musculoskeletal:        General: Normal range of motion.     Cervical back: Normal range of motion.  Skin:    General: Skin is warm and dry.  Neurological:     General: No focal deficit present.     Mental Status: He is alert.  Psychiatric:        Attention and Perception: Attention normal.        Mood and Affect: Mood is anxious.        Speech: Speech normal.        Behavior: Behavior is agitated. Behavior is not aggressive.        Thought Content: Thought content is not paranoid. Thought content does not include homicidal or suicidal ideation.        Cognition and Memory: Cognition is impaired.        Judgment: Judgment is impulsive.     Review of Systems  Constitutional: Negative.   HENT: Negative.   Eyes: Negative.   Respiratory: Negative.  Cardiovascular: Negative.   Gastrointestinal: Negative.   Musculoskeletal: Negative.   Skin: Negative.   Neurological: Negative.   Psychiatric/Behavioral: Positive for agitation. The patient is nervous/anxious.     Blood pressure 100/71, pulse 67, temperature 97.6 F (36.4 C), temperature source Oral, resp. rate 18, height 5\' 8"  (1.727 m), weight 104.3 kg, SpO2 100 %.Body mass index is 34.96 kg/m.  General Appearance: Casual  Eye Contact:  Fair  Speech:  Clear and Coherent  Volume:  Normal  Mood:  Anxious  Affect:  Blunt  Thought Process:  Coherent  Orientation:  Full (Time, Place, and Person)  Thought Content:  Tangential  Suicidal Thoughts:  No  Homicidal Thoughts:  No  Memory:  Immediate;   Fair Recent;   Poor Remote;   Poor  Judgement:  Impaired   Insight:  Shallow  Psychomotor Activity:  Restlessness  Concentration:  Concentration: Fair  Recall:  [employer] of Knowledge:  Fair  Language:  Fair  Akathisia:  No  Handed:  Right  AIMS (if indicated):     Assets:  Desire for Improvement Social Support  ADL's:  Impaired  Cognition:  Impaired,  Mild  Sleep:  Number of Hours: 5     Treatment Plan Summary: Daily contact with patient to assess and evaluate symptoms and progress in treatment, Medication management and Plan Restart Flomax as recommended by urology.  Thank you very much for the consult.  No change to psychiatric medicine.  Supportive counseling and reassurance done.  We are continuing to look into potential discharge plans  Alethia Berthold, MD 03/02/2020, 3:03 PM

## 2020-03-02 NOTE — Progress Notes (Signed)
°   03/02/20 1000  Psych Admission Type (Psych Patients Only)  Admission Status Involuntary  Psychosocial Assessment  Patient Complaints Anxiety  Eye Contact Brief  Facial Expression Anxious;Worried  Affect Anxious  Catering manager Activity Slow  Appearance/Hygiene Unremarkable  Behavior Characteristics Cooperative;Appropriate to situation  Mood Pleasant;Anxious  Aggressive Behavior  Effect No apparent injury  Thought Process  Coherency WDL  Content WDL  Delusions None reported or observed  Perception WDL  Hallucination None reported or observed  Judgment WDL  Confusion None  Danger to Self  Current suicidal ideation? Denies  Self-Injurious Behavior No self-injurious ideation or behavior indicators observed or expressed   Agreement Not to Harm Self Yes  Description of Agreement Verbal  Danger to Others  Danger to Others None reported or observed  Pt preoccupied with his papers in his room this morning came late to take his medication late  stating  "I was busy in my room this morning" Alert and oriented x 3. Denies SI/HI or A/VH. Support and encouragement provided as needed. Will continue to monitor.
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
## 2020-03-02 NOTE — Progress Notes (Signed)
Recreation Therapy Notes Date: 03/02/2020  Time: 9:30 am   Location: Craft room     Behavioral response: N/A   Intervention Topic: Leisure   Discussion/Intervention: Patient did not attend group.   Clinical Observations/Feedback:  Patient did not attend group.   Taegan Haider LRT/CTRS       Lucely Leard 03/02/2020 1:00 PM

## 2020-03-02 NOTE — BHH Counselor (Signed)
CSW spoke with the patient's brother.  CSW updated the patients brother that many placements have declined the patient due to elopement behaviors. He requested that the patient's FL2 be sent to [facility] at [phone].  Assunta Curtis, MSW, LCSW 03/02/2020 2:14 PM

## 2020-03-02 NOTE — Progress Notes (Signed)
Patient spent most of the evening in the dayroom with peers, he denies SI/HI/AVH . Patient still endorsees some anxiety and depression. Patient compliant with medication administration. Patient being monitored Q 15 minutes for safety per unit protocol. Patient is currently in bed resting quietly at this time.

## 2020-03-02 NOTE — BHH Counselor (Signed)
CSW contacted the following to locate possible bed placement/follow up on previous phone call messages:     Oletta Cohn, [phone] Left VM requesting a return phone call.     Dan Maker, [phone] CSW spoke with Ms. Lattie Haw who reports she doesn't think pt will fit due to running off behavior. She reports that she checked with a colleague who also has male beds, however, pt was again declined due to wandering behaviors.     Dellis Anes, [phone] CSW left HIPAA compliant voicemail.     Randal Buba, [place], [phone] CSW left HIPAA compliant voicemail.    Alla Feeling, [place], [phone] CSW left HIPAA compliant voicemail at 11:59AM.     Lavada Mesi, [phone] No male beds available.   Manon Hilding, [phone] CSW left HIPAA compliant voicemail at 12:03PM.   Chrystie Nose, [place], [phone] CSW spoke with Ivin Booty who reports that she has not reviewed the paperwork at this time.     Terri Skains, [phone] CSW spoke with Tammy [place] that [place].  She referred CSW to another possible resource.    Assunta Curtis, MSW, LCSW 03/02/2020 12:08 PM

## 2020-03-02 NOTE — Progress Notes (Signed)
Patient less agitated this evening compared to last night. Patient denies SI/HI/AVH and pain. Patient stated he feels the best tonight that he has in a long time. Patient compliant with medication administration per MD orders. Patient given education, support, and encouragement to be active in his treatment plan. Patient being monitored Q 15 minutes for safety per unit protocol. Patient remains safe on the unit.Patient presents with mild confusion but is easily redirectable.Patient preoccupied with his artwork. Patient given education and support.

## 2020-03-02 NOTE — Plan of Care (Signed)
Patient presents at his baseline  Problem: Education: Goal: Emotional status will improve Outcome: Not Progressing Goal: Mental status will improve Outcome: Not Progressing   

## 2020-03-03 NOTE — [facility] Group Notes (Signed)
[facility] Group Notes:  (Nursing/MHT/Case Management/Adjunct)  Date:  03/03/2020  Time:  10:08 PM  Type of Therapy:  Group Therapy  Participation Level:  Minimal  Participation Quality:  Appropriate and Left out had a phone call.   Alexander Beard 03/03/2020, 10:08 PM

## 2020-03-03 NOTE — Plan of Care (Signed)
  Problem: Group Participation °Goal: STG - Patient will engage in groups with a calm and appropriate mood at least 2x within 5 recreation therapy group sessions °Description: STG - Patient will engage in groups with a calm and appropriate mood at least 2x within 5 recreation therapy group sessions °Outcome: Progressing °  °

## 2020-03-03 NOTE — Plan of Care (Signed)
Pt denies depression, anxiety, SI, HI and AVH. Pt was educated on care plan and verbalizes understanding. Pt was encouraged to attend groups. Collier Bullock RN Problem: Education: Goal: Knowledge of [facility] Education information/materials will improve Outcome: Progressing Goal: Emotional status will improve Outcome: Progressing Goal: Mental status will improve Outcome: Progressing Goal: Verbalization of understanding the information provided will improve Outcome: Progressing   Problem: Activity: Goal: Interest or engagement in activities will improve Outcome: Progressing Goal: Sleeping patterns will improve Outcome: Progressing   Problem: Coping: Goal: Ability to verbalize frustrations and anger appropriately will improve Outcome: Progressing Goal: Ability to demonstrate self-control will improve Outcome: Progressing   Problem: Health Behavior/Discharge Planning: Goal: Identification of resources available to assist in meeting health care needs will improve Outcome: Progressing Goal: Compliance with treatment plan for underlying cause of condition will improve Outcome: Progressing   Problem: Physical Regulation: Goal: Ability to maintain clinical measurements within normal limits will improve Outcome: Progressing   Problem: Safety: Goal: Periods of time without injury will increase Outcome: Progressing   Problem: Education: Goal: Ability to make informed decisions regarding treatment will improve Outcome: Progressing   Problem: Coping: Goal: Coping ability will improve Outcome: Progressing   Problem: Health Behavior/Discharge Planning: Goal: Identification of resources available to assist in meeting health care needs will improve Outcome: Progressing   Problem: Medication: Goal: Compliance with prescribed medication regimen will improve Outcome: Progressing   Problem: Self-Concept: Goal: Ability to disclose and discuss suicidal ideas will  improve Outcome: Progressing Goal: Will verbalize positive feelings about self Outcome: Progressing

## 2020-03-03 NOTE — Evaluation (Signed)
Physical Therapy Evaluation Patient Details Name: Alexander Beard MRN: [id number] DOB: 11-01-1955 Today's Date: 03/03/2020   History of Present Illness  Alexander Beard is a 32yoM who comes to [facility] [place] on 8/12 with high anxiety, confusion. Pt has repeatedly been running way from his group home, now given a 30day notice to vacate. Pt currently admitted to behavioral health unit.  Clinical Impression  Pt admitted with above diagnosis. Pt currently with functional limitations due to the deficits listed below (see "PT Problem List"). Upon entry, pt in bed, awake and agreeable to participate. The pt is alert and oriented x4, pleasant, conversational, and generally a good historian. Pt reports some past falls infrequently, but is glad he is still able to get up from floor 'most of the time.' Pt performs all basic mobility this date without impairment, fully independent. Pt willing to attempt more advanced balance screening, with a single LOB, able to self-correct without any physical assist. Pt reports to be near his typical baseline for mobility and balance. All education completed, and time is given to address all questions/concerns. No additional skilled PT services needed at this time, PT signing off. PT recommends daily ambulation ad lib or with nursing staff as needed to prevent deconditioning.      Follow Up Recommendations No PT follow up    Equipment Recommendations  None recommended by PT    Recommendations for Other Services       Precautions / Restrictions Precautions Precautions: None      Mobility  Bed Mobility Overal bed mobility: Independent                Transfers Overall transfer level: Independent                  Ambulation/Gait Ambulation/Gait assistance: Independent Gait Distance (Feet): 250 Feet Assistive device: None Gait Pattern/deviations: WFL(Within Functional Limits)   Gait velocity interpretation: >4.37 ft/sec, indicative of normal walking  speed General Gait Details: mild assymetry in gait, nothing atypical for age matched norm values;  Stairs            Wheelchair Mobility    Modified Rankin (Stroke Patients Only)       Balance Overall balance assessment: Independent                                           Pertinent Vitals/Pain Pain Assessment: No/denies pain    Home Living Family/patient expects to be discharged to:: Unsure (Pt asked to leave his group home, placement pending.)                      Prior Function                 Hand Dominance        Extremity/Trunk Assessment   Upper Extremity Assessment Upper Extremity Assessment: Overall WFL for tasks assessed    Lower Extremity Assessment Lower Extremity Assessment: Overall WFL for tasks assessed    Cervical / Trunk Assessment Cervical / Trunk Assessment: Normal  Communication      Cognition Arousal/Alertness: Awake/alert Behavior During Therapy: Restless;WFL for tasks assessed/performed Overall Cognitive Status: History of cognitive impairments - at baseline  General Comments General comments (skin integrity, edema, etc.): mild LOB with step-taps on trashcan, able to perform self-righting without support    Exercises     Assessment/Plan    PT Assessment All further PT needs can be met in the next venue of care  PT Problem List Decreased balance       PT Treatment Interventions      PT Goals (Current goals can be found in the Care Plan section)  Acute Rehab PT Goals PT Goal Formulation: All assessment and education complete, DC therapy    Frequency     Barriers to discharge        Co-evaluation               AM-PAC PT "6 Clicks" Mobility  Outcome Measure Help needed turning from your back to your side while in a flat bed without using bedrails?: None Help needed moving from lying on your back to sitting on the side of  a flat bed without using bedrails?: None Help needed moving to and from a bed to a chair (including a wheelchair)?: None Help needed standing up from a chair using your arms (e.g., wheelchair or bedside chair)?: None Help needed to walk in hospital room?: A Little Help needed climbing 3-5 steps with a railing? : A Little 6 Click Score: 22    End of Session   Activity Tolerance: Patient tolerated treatment well;No increased pain Patient left: in bed Nurse Communication: Mobility status PT Visit Diagnosis: Other abnormalities of gait and mobility (R26.89)    Time: 3354-5625 PT Time Calculation (min) (ACUTE ONLY): 11 min   Charges:   PT Evaluation $PT Eval Low Complexity: 1 Low          1:14 PM, 03/03/20 Etta Grandchild, PT, DPT Physical Therapist - [facility]  [phone] ([place])    [place] C 03/03/2020, 1:12 PM

## 2020-03-03 NOTE — Progress Notes (Signed)
   03/03/20 1400  Clinical Encounter Type  Visited With Patient  Visit Type Follow-up;Psychological support;Spiritual support;Behavioral Health  Referral From Chaplain  Consult/Referral To Chaplain  Pt attended Ch group today. The subject discussed was love. Pt was evolved in conversation. Ch will follow-up.

## 2020-03-03 NOTE — Progress Notes (Signed)
[facility] MD Progress Note  03/03/2020 2:44 PM Alexander Beard  MRN:  [id number] Subjective: Follow-up for this gentleman with developmental disability and schizophrenia.  Generally continues to function pretty well.  This afternoon he briefly misplaced his notebook which almost threw him into a panic attack until one of the nurses located it.  Otherwise however he is not showing any major behavior problems.  Still a little hyperverbal.  Can be intrusive.  We have so far not been able to find any discharge plan that is acceptable Principal Problem: Schizoaffective disorder, bipolar type ([place]) Diagnosis: Principal Problem:   Schizoaffective disorder, bipolar type ([place]) Active Problems:   Active autistic disorder   Diabetes ([place])   Hypertension   Prostate hypertrophy  Total Time spent with patient: 30 minutes  Past Psychiatric History: Lifelong history of schizoaffective disorder and/or developmental disability  Past Medical History:  Past Medical History:  Diagnosis Date  . Anxiety   . Anxiety disorder due to known physiological condition    HOSPITALIZED 10/18  . Arthritis   . Autistic disorder, residual state   . COPD (chronic obstructive pulmonary disease) ([place])   . Depression   . Developmental disorder   . Diabetes mellitus without complication ([place])   . Dyslipidemia   . Esophageal reflux   . HOH (hard of hearing)    MILDLY  . Hypertension   . Obesity   . Overactive bladder   . Palpitations    ANXIETY  . Schizophrenia, schizoaffective ([place])   . Sleep apnea   . Tremors of nervous system    HANDS DUE TO MEDICATIONS  . Urinary incontinence     Past Surgical History:  Procedure Laterality Date  . CATARACT EXTRACTION W/PHACO Left 11/14/2017   Procedure: CATARACT EXTRACTION PHACO AND INTRAOCULAR LENS PLACEMENT (IOC);  Surgeon: Birder Robson, MD;  Location: [facility] ORS;  Service: Ophthalmology;  Laterality: Left;  [place] 00:42 AP% 14.6 CDE 6.12 Fluid pack lot # [id number]  .  COLONOSCOPY  01/28/2005  . COLONOSCOPY WITH PROPOFOL N/A 05/09/2016   Procedure: COLONOSCOPY WITH PROPOFOL;  Surgeon: Manya Silvas, MD;  Location: [facility] ENDOSCOPY;  Service: Endoscopy;  Laterality: N/A;  . ESOPHAGOGASTRODUODENOSCOPY N/A 05/09/2016   Procedure: ESOPHAGOGASTRODUODENOSCOPY (EGD);  Surgeon: Manya Silvas, MD;  Location: [facility] ENDOSCOPY;  Service: Endoscopy;  Laterality: N/A;  . FRACTURE SURGERY     ORIF SHOULDER  . TONSILLECTOMY     Family History:  Family History  Problem Relation Age of Onset  . Hypertension Mother   . Stroke Father   . Heart Problems Father    Family Psychiatric  History: See previous Social History:  Social History   Substance and Sexual Activity  Alcohol Use No  . Alcohol/week: 0.0 standard drinks     Social History   Substance and Sexual Activity  Drug Use No    Social History   Socioeconomic History  . Marital status: Single    Spouse name: Not on file  . Number of children: Not on file  . Years of education: Not on file  . Highest education level: Not on file  Occupational History  . Not on file  Tobacco Use  . Smoking status: Never Smoker  . Smokeless tobacco: Never Used  Vaping Use  . Vaping Use: Never used  Substance and Sexual Activity  . Alcohol use: No    Alcohol/week: 0.0 standard drinks  . Drug use: No  . Sexual activity: Never  Other Topics Concern  . Not on file  Social History Narrative   The patient never finished high school but did get his GED. 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 He is currently in disability and [name] is his legal guardian. He has been living in group homes for many years.      No pending legal charges   Social Determinants of Health   Financial Resource Strain:   . Difficulty of Paying Living Expenses: Not on file  Food Insecurity:   . Worried About Charity fundraiser in the Last Year: Not on file  .  Ran Out of Food in the Last Year: Not on file  Transportation Needs:   . Lack of Transportation (Medical): Not on file  . Lack of Transportation (Non-Medical): Not on file  Physical Activity:   . Days of Exercise per Week: Not on file  . Minutes of Exercise per Session: Not on file  Stress:   . Feeling of Stress : Not on file  Social Connections:   . Frequency of Communication with Friends and Family: Not on file  . Frequency of Social Gatherings with Friends and Family: Not on file  . Attends Religious Services: Not on file  . Active Member of Clubs or Organizations: Not on file  . Attends Archivist Meetings: Not on file  . Marital Status: Not on file   Additional Social History:                         Sleep: Fair  Appetite:  Fair  Current Medications: Current Facility-Administered Medications  Medication Dose Route Frequency Provider Last Rate Last Admin  . acetaminophen (TYLENOL) tablet 650 mg  650 mg Oral Q6H PRN [name], MD      . alum & mag hydroxide-simeth (MAALOX/MYLANTA) 200-200-20 MG/5ML suspension 30 mL  30 mL Oral Q4H PRN [name], MD      . aspirin EC tablet 81 mg  81 mg Oral Daily Yadira Hada, [name], MD   81 mg at 03/03/20 0758  . clonazePAM (KLONOPIN) tablet 1 mg  1 mg Oral QID Chava Dulac, [name], MD   1 mg at 03/03/20 1230  . docusate sodium (COLACE) capsule 100 mg  100 mg Oral BID Avani Sensabaugh, [name], MD   100 mg at 03/03/20 0758  . gabapentin (NEURONTIN) capsule 100 mg  100 mg Oral TID Ansley Stanwood T, MD   100 mg at 03/03/20 1230  . hydrOXYzine (ATARAX/VISTARIL) tablet 10 mg  10 mg Oral TID PRN [name], MD   10 mg at 02/20/20 2127  . lamoTRIgine (LAMICTAL) tablet 100 mg  100 mg Oral QHS Fryda Molenda, [name], MD   100 mg at 03/02/20 2145  . linagliptin (TRADJENTA) tablet 5 mg  5 mg Oral Daily Lester Platas, [name], MD   5 mg at 03/03/20 0758  . loratadine (CLARITIN) tablet 10 mg  10 mg Oral Daily Hetty Linhart, [name], MD   10 mg at 03/03/20  0759  . magnesium hydroxide (MILK OF MAGNESIA) suspension 30 mL  30 mL Oral Daily PRN [name], MD      . metFORMIN (GLUCOPHAGE) tablet 850 mg  850 mg Oral BID WC Talma Aguillard, [name], MD   850 mg at 03/03/20 0803  . metoprolol succinate (TOPROL-XL) 24 hr tablet 50 mg  50 mg Oral Daily Ajeet Casasola T, MD   50 mg at 03/03/20 0800  .  oxybutynin (DITROPAN) tablet 10 mg  10 mg Oral BID Raphaella Larkin, [name], MD   10 mg at 03/03/20 0802  . pantoprazole (PROTONIX) EC tablet 40 mg  40 mg Oral Daily Ryoma Nofziger, [name], MD   40 mg at 03/03/20 0759  . QUEtiapine (SEROQUEL) tablet 100 mg  100 mg Oral QID Tymere Depuy T, MD   100 mg at 03/03/20 1231  . QUEtiapine (SEROQUEL) tablet 400 mg  400 mg Oral QHS [name], NP   400 mg at 03/02/20 2145  . sertraline (ZOLOFT) tablet 100 mg  100 mg Oral BID Bodi Palmeri T, MD   100 mg at 03/03/20 0800  . simvastatin (ZOCOR) tablet 20 mg  20 mg Oral q1800 Zeppelin Commisso, [name], MD   20 mg at 03/02/20 1727  . tamsulosin (FLOMAX) capsule 0.4 mg  0.4 mg Oral QPC supper Rorey Hodges, [name], MD        Lab Results: No results found for this or any previous visit (from the past 48 hour(s)).  Blood Alcohol level:  Lab Results  Component Value Date   ETH <10 01/22/2020   ETH <10 29/93/7169    Metabolic Disorder Labs: Lab Results  Component Value Date   HGBA1C 6.6 (H) 02/20/2020   MPG 142.72 02/20/2020   No results found for: PROLACTIN Lab Results  Component Value Date   CHOL 153 04/10/2015   TRIG 166 (H) 04/10/2015   HDL 50 04/10/2015   CHOLHDL 3.1 04/10/2015   VLDL 33 04/10/2015   LDLCALC 70 04/10/2015    Physical Findings: AIMS:  , ,  ,  ,    CIWA:    COWS:     Musculoskeletal: Strength & Muscle Tone: within normal limits Gait & Station: normal Patient leans: N/A  Psychiatric Specialty Exam: Physical Exam Vitals and nursing note reviewed.  Constitutional:      Appearance: He is well-developed.  HENT:     Head: Normocephalic and atraumatic.   Eyes:     Conjunctiva/sclera: Conjunctivae normal.     Pupils: Pupils are equal, round, and reactive to light.  Cardiovascular:     Heart sounds: Normal heart sounds.  Pulmonary:     Effort: Pulmonary effort is normal.  Abdominal:     Palpations: Abdomen is soft.  Musculoskeletal:        General: Normal range of motion.     Cervical back: Normal range of motion.  Skin:    General: Skin is warm and dry.  Neurological:     General: No focal deficit present.     Mental Status: He is alert.  Psychiatric:        Attention and Perception: He is inattentive.        Mood and Affect: Mood is anxious.        Speech: Speech normal.        Behavior: Behavior is agitated. Behavior is not aggressive.        Thought Content: Thought content is not paranoid. Thought content does not include homicidal or suicidal ideation.        Cognition and Memory: Cognition is impaired.        Judgment: Judgment is impulsive.     Review of Systems  Constitutional: Negative.   HENT: Negative.   Eyes: Negative.   Respiratory: Negative.   Cardiovascular: Negative.   Gastrointestinal: Negative.   Musculoskeletal: Negative.   Skin: Negative.   Neurological: Positive for tremors.  Psychiatric/Behavioral: Negative.     Blood pressure 121/77,  pulse 72, temperature 98.5 F (36.9 C), temperature source Oral, resp. rate 18, height 5\' 8"  (1.727 m), weight 104.3 kg, SpO2 96 %.Body mass index is 34.96 kg/m.  General Appearance: Casual  Eye Contact:  Good  Speech:  Clear and Coherent  Volume:  Normal  Mood:  Euthymic  Affect:  Constricted  Thought Process:  Goal Directed  Orientation:  Full (Time, Place, and Person)  Thought Content:  Logical  Suicidal Thoughts:  No  Homicidal Thoughts:  No  Memory:  Immediate;   Fair Recent;   Fair Remote;   Fair  Judgement:  Fair  Insight:  Fair  Psychomotor Activity:  Normal  Concentration:  Concentration: Fair  Recall:  [employer] of Knowledge:  Fair   Language:  Fair  Akathisia:  No  Handed:  Right  AIMS (if indicated):     Assets:  Desire for Improvement Housing Leisure Time Physical Health  ADL's:  Intact  Cognition:  WNL  Sleep:  Number of Hours: 5.5     Treatment Plan Summary: Daily contact with patient to assess and evaluate symptoms and progress in treatment, Medication management and Plan No change to medication management.  Enuresis seems a little better.  Behavior is good.  Working on discharge planning.  Alethia Berthold, MD 03/03/2020, 2:44 PM

## 2020-03-03 NOTE — Progress Notes (Signed)
Recreation Therapy Notes  Date: 08/24//2021  Time: 9:30 am  Location: Craft room   Behavioral response: Appropriate  Intervention Topic: Relaxation   Discussion/Intervention:  Group content today was focused on relaxation. The group defined relaxation and identified healthy ways to relax. Individuals expressed how much time they spend relaxing. Patients expressed how much their life would be if they did not make time for themselves to relax. The group stated ways they could improve their relaxation techniques in the future.  Individuals participated in the intervention "Time to Relax" where they had a chance to experience different relaxation techniques.  Clinical Observations/Feedback:  Patient came to group late and was focused on what peers and staff had to say about relaxation. Individual was social with peers and staff while participating in the intervention. Miguelangel Korn LRT/CTRS         Jaret Coppedge 03/03/2020 11:14 AM

## 2020-03-03 NOTE — Progress Notes (Signed)
D- Patient alert and oriented. Affect/mood. Pt denies SI, HI, AVH, and pain. Pt has attended groups and has kept himself busy doing art work. Pt is pleasant.   A- Scheduled medications administered to patient, per MD orders. Support and encouragement provided.  Routine safety checks conducted every 15 minutes.  Patient informed to notify staff with problems or concerns.  R- No adverse drug reactions noted. Patient contracts for safety at this time. Patient compliant with medications and treatment plan. Patient receptive, calm, and cooperative. Patient interacts well with others on the unit.  Patient remains safe at this time.  Collier Bullock RN

## 2020-03-04 NOTE — Progress Notes (Signed)
Patient is alert and oriented x4. He is pleasant and easy to engage. Patient continues to present with an anxious affect, but this may well be his normal baseline presentation.  He is active on the unit and interacts well with other peers.  He is med compliant and tolerates med without incident. He remains safe on the unit with 15 minute safety checks and informed to contact staff with any concerns.     Cleo Butler-Nicholson, LPN

## 2020-03-04 NOTE — Progress Notes (Signed)
[facility] MD Progress Note  03/04/2020 2:33 PM Alexander Beard  MRN:  [id number] Subjective: Follow-up for this patient with developmental disability and schizoaffective disorder.  For the most part he is having no behavior problems.  Today he came to me in an anxiety state all worried about a form that he had been given "a message from Medicare".  I had to explain to him that it was not a request for anything from him, he did not need to fill that out, and that it was not anything he needed to worry about.  Other than that his behavior has been fine.  Denies suicidal or homicidal thought.  Getting along well with others.  Vitals stable.  Blood sugars were doing well when we were checking. Principal Problem: Schizoaffective disorder, bipolar type ([place]) Diagnosis: Principal Problem:   Schizoaffective disorder, bipolar type ([place]) Active Problems:   Active autistic disorder   Diabetes ([place])   Hypertension   Prostate hypertrophy  Total Time spent with patient: 30 minutes  Past Psychiatric History: Past history of chronic mental illness  Past Medical History:  Past Medical History:  Diagnosis Date  . Anxiety   . Anxiety disorder due to known physiological condition    HOSPITALIZED 10/18  . Arthritis   . Autistic disorder, residual state   . COPD (chronic obstructive pulmonary disease) ([place])   . Depression   . Developmental disorder   . Diabetes mellitus without complication ([place])   . Dyslipidemia   . Esophageal reflux   . HOH (hard of hearing)    MILDLY  . Hypertension   . Obesity   . Overactive bladder   . Palpitations    ANXIETY  . Schizophrenia, schizoaffective ([place])   . Sleep apnea   . Tremors of nervous system    HANDS DUE TO MEDICATIONS  . Urinary incontinence     Past Surgical History:  Procedure Laterality Date  . CATARACT EXTRACTION W/PHACO Left 11/14/2017   Procedure: CATARACT EXTRACTION PHACO AND INTRAOCULAR LENS PLACEMENT (IOC);  Surgeon: Birder Robson, MD;  Location:  [facility] ORS;  Service: Ophthalmology;  Laterality: Left;  [place] 00:42 AP% 14.6 CDE 6.12 Fluid pack lot # [id number]  . COLONOSCOPY  01/28/2005  . COLONOSCOPY WITH PROPOFOL N/A 05/09/2016   Procedure: COLONOSCOPY WITH PROPOFOL;  Surgeon: Manya Silvas, MD;  Location[facility] ENDOSCOPY;  Service: Endoscopy;  Laterality: N/A;  . ESOPHAGOGASTRODUODENOSCOPY N/A 05/09/2016   Procedure: ESOPHAGOGASTRODUODENOSCOPY (EGD);  Surgeon: Manya Silvas, MD;  Location: [facility] ENDOSCOPY;  Service: Endoscopy;  Laterality: N/A;  . FRACTURE SURGERY     ORIF SHOULDER  . TONSILLECTOMY     Family History:  Family History  Problem Relation Age of Onset  . Hypertension Mother   . Stroke Father   . Heart Problems Father    Family Psychiatric  History: See previous Social History:  Social History   Substance and Sexual Activity  Alcohol Use No  . Alcohol/week: 0.0 standard drinks     Social History   Substance and Sexual Activity  Drug Use No    Social History   Socioeconomic History  . Marital status: Single    Spouse name: Not on file  . Number of children: Not on file  . Years of education: Not on file  . Highest education level: Not on file  Occupational History  . Not on file  Tobacco Use  . Smoking status: Never Smoker  . Smokeless tobacco: Never Used  Vaping Use  . Vaping Use: Never  used  Substance and Sexual Activity  . Alcohol use: No    Alcohol/week: 0.0 standard drinks  . Drug use: No  . Sexual activity: Never  Other Topics Concern  . Not on file  Social History Narrative   The patient never finished high school but did get his GED. 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 He is currently in disability and [name] is his legal guardian. He has been living in group homes for many years.      No pending legal charges   Social Determinants of Health   Financial Resource Strain:   . Difficulty of Paying Living  Expenses: Not on file  Food Insecurity:   . Worried About Charity fundraiser in the Last Year: Not on file  . Ran Out of Food in the Last Year: Not on file  Transportation Needs:   . Lack of Transportation (Medical): Not on file  . Lack of Transportation (Non-Medical): Not on file  Physical Activity:   . Days of Exercise per Week: Not on file  . Minutes of Exercise per Session: Not on file  Stress:   . Feeling of Stress : Not on file  Social Connections:   . Frequency of Communication with Friends and Family: Not on file  . Frequency of Social Gatherings with Friends and Family: Not on file  . Attends Religious Services: Not on file  . Active Member of Clubs or Organizations: Not on file  . Attends Archivist Meetings: Not on file  . Marital Status: Not on file   Additional Social History:                         Sleep: Fair  Appetite:  Fair  Current Medications: Current Facility-Administered Medications  Medication Dose Route Frequency Provider Last Rate Last Admin  . acetaminophen (TYLENOL) tablet 650 mg  650 mg Oral Q6H PRN [name], MD      . alum & mag hydroxide-simeth (MAALOX/MYLANTA) 200-200-20 MG/5ML suspension 30 mL  30 mL Oral Q4H PRN [name], MD      . aspirin EC tablet 81 mg  81 mg Oral Daily Ambri Miltner, [name], MD   81 mg at 03/04/20 0925  . clonazePAM (KLONOPIN) tablet 1 mg  1 mg Oral QID Jaymison Luber, [name], MD   1 mg at 03/04/20 1324  . docusate sodium (COLACE) capsule 100 mg  100 mg Oral BID Ewen Varnell, [name], MD   100 mg at 03/04/20 0924  . gabapentin (NEURONTIN) capsule 100 mg  100 mg Oral TID Macaela Presas T, MD   100 mg at 03/04/20 1324  . hydrOXYzine (ATARAX/VISTARIL) tablet 10 mg  10 mg Oral TID PRN [name], MD   10 mg at 02/20/20 2127  . lamoTRIgine (LAMICTAL) tablet 100 mg  100 mg Oral QHS Karston Hyland, [name], MD   100 mg at 03/03/20 2154  . linagliptin (TRADJENTA) tablet 5 mg  5 mg Oral Daily Charley Lafrance, [name], MD   5 mg  at 03/04/20 0925  . loratadine (CLARITIN) tablet 10 mg  10 mg Oral Daily Anayely Constantine, [name], MD   10 mg at 03/04/20 0926  . magnesium hydroxide (MILK OF MAGNESIA) suspension 30 mL  30 mL Oral Daily PRN [name], MD   30 mL at 03/04/20 0928  . metFORMIN (GLUCOPHAGE) tablet 850 mg  850  mg Oral BID WC Theus Espin, [name], MD   850 mg at 03/04/20 5885  . metoprolol succinate (TOPROL-XL) 24 hr tablet 50 mg  50 mg Oral Daily Oden Lindaman, [name], MD   50 mg at 03/04/20 0925  . oxybutynin (DITROPAN) tablet 10 mg  10 mg Oral BID Bridgett Hattabaugh, [name], MD   10 mg at 03/04/20 0928  . pantoprazole (PROTONIX) EC tablet 40 mg  40 mg Oral Daily Pearle Wandler, [name], MD   40 mg at 03/04/20 0926  . QUEtiapine (SEROQUEL) tablet 100 mg  100 mg Oral QID Lucyann Romano T, MD   100 mg at 03/04/20 1325  . QUEtiapine (SEROQUEL) tablet 400 mg  400 mg Oral QHS [name], NP   400 mg at 03/03/20 2153  . sertraline (ZOLOFT) tablet 100 mg  100 mg Oral BID Jonte Shiller, [name], MD   100 mg at 03/04/20 0925  . simvastatin (ZOCOR) tablet 20 mg  20 mg Oral q1800 Keyerra Lamere T, MD   20 mg at 03/03/20 1622  . tamsulosin (FLOMAX) capsule 0.4 mg  0.4 mg Oral QPC supper Virgil Lightner T, MD   0.4 mg at 03/03/20 1620    Lab Results: No results found for this or any previous visit (from the past 48 hour(s)).  Blood Alcohol level:  Lab Results  Component Value Date   ETH <10 01/22/2020   ETH <10 02/77/4128    Metabolic Disorder Labs: Lab Results  Component Value Date   HGBA1C 6.6 (H) 02/20/2020   MPG 142.72 02/20/2020   No results found for: PROLACTIN Lab Results  Component Value Date   CHOL 153 04/10/2015   TRIG 166 (H) 04/10/2015   HDL 50 04/10/2015   CHOLHDL 3.1 04/10/2015   VLDL 33 04/10/2015   LDLCALC 70 04/10/2015    Physical Findings: AIMS:  , ,  ,  ,    CIWA:    COWS:     Musculoskeletal: Strength & Muscle Tone: within normal limits Gait & Station: normal Patient leans: N/A  Psychiatric Specialty  Exam: Physical Exam Vitals and nursing note reviewed.  Constitutional:      Appearance: He is well-developed.  HENT:     Head: Normocephalic and atraumatic.  Eyes:     Conjunctiva/sclera: Conjunctivae normal.     Pupils: Pupils are equal, round, and reactive to light.  Cardiovascular:     Heart sounds: Normal heart sounds.  Pulmonary:     Effort: Pulmonary effort is normal.  Abdominal:     Palpations: Abdomen is soft.  Musculoskeletal:        General: Normal range of motion.     Cervical back: Normal range of motion.  Skin:    General: Skin is warm and dry.  Neurological:     General: No focal deficit present.     Mental Status: He is alert.  Psychiatric:        Attention and Perception: He is inattentive.        Mood and Affect: Mood is anxious.        Speech: Speech is tangential.        Behavior: Behavior is agitated. Behavior is not aggressive.        Thought Content: Thought content does not include homicidal or suicidal ideation.        Cognition and Memory: Cognition is impaired.        Judgment: Judgment is inappropriate.     Review of Systems  Constitutional: Negative.  HENT: Negative.   Eyes: Negative.   Respiratory: Negative.   Cardiovascular: Negative.   Gastrointestinal: Negative.   Musculoskeletal: Negative.   Skin: Negative.   Neurological: Negative.   Psychiatric/Behavioral: Negative for suicidal ideas. The patient is nervous/anxious.     Blood pressure 114/72, pulse (!) 57, temperature 98.7 F (37.1 C), temperature source Oral, resp. rate 18, height 5\' 8"  (1.727 m), weight 104.3 kg, SpO2 100 %.Body mass index is 34.96 kg/m.  General Appearance: Casual  Eye Contact:  Fair  Speech:  Pressured  Volume:  Increased  Mood:  Euthymic  Affect:  Constricted  Thought Process:  Disorganized  Orientation:  Full (Time, Place, and Person)  Thought Content:  Rumination and Tangential  Suicidal Thoughts:  No  Homicidal Thoughts:  No  Memory:  Immediate;    Fair Recent;   Poor Remote;   Poor  Judgement:  Impaired  Insight:  Shallow  Psychomotor Activity:  Restlessness  Concentration:  Concentration: Fair  Recall:  [employer] of Knowledge:  Fair  Language:  Fair  Akathisia:  No  Handed:  Right  AIMS (if indicated):     Assets:  Desire for Improvement Resilience Social Support  ADL's:  Intact  Cognition:  Impaired,  Mild  Sleep:  Number of Hours: 7     Treatment Plan Summary: Daily contact with patient to assess and evaluate symptoms and progress in treatment, Medication management and Plan No change to medication.  Encourage patient to take attention to his bodily needs and to urinate as soon as he notices a need to do so in the toilet.  Reviewed with treatment team.  Still working on placement options.  Alethia Berthold, MD 03/04/2020, 2:33 PM

## 2020-03-04 NOTE — Plan of Care (Addendum)
Pt denies depression, anxiety, SI, HI and AVH. Pt was educated on care plan and verbalizes understanding. Pt was encouraged to attend groups. Collier Bullock RN Problem: Education: Goal: Knowledge of [place] [facility] Education information/materials will improve Outcome: Progressing Goal: Emotional status will improve Outcome: Progressing Goal: Mental status will improve Outcome: Progressing Goal: Verbalization of understanding the information provided will improve Outcome: Progressing   Problem: Activity: Goal: Interest or engagement in activities will improve Outcome: Progressing Goal: Sleeping patterns will improve Outcome: Progressing   Problem: Coping: Goal: Ability to verbalize frustrations and anger appropriately will improve Outcome: Progressing Goal: Ability to demonstrate self-control will improve Outcome: Progressing   Problem: Health Behavior/Discharge Planning: Goal: Identification of resources available to assist in meeting health care needs will improve Outcome: Progressing Goal: Compliance with treatment plan for underlying cause of condition will improve Outcome: Progressing   Problem: Physical Regulation: Goal: Ability to maintain clinical measurements within normal limits will improve Outcome: Progressing   Problem: Safety: Goal: Periods of time without injury will increase Outcome: Progressing   Problem: Education: Goal: Ability to make informed decisions regarding treatment will improve Outcome: Progressing   Problem: Coping: Goal: Coping ability will improve Outcome: Progressing   Problem: Health Behavior/Discharge Planning: Goal: Identification of resources available to assist in meeting health care needs will improve Outcome: Progressing   Problem: Medication: Goal: Compliance with prescribed medication regimen will improve Outcome: Progressing   Problem: Self-Concept: Goal: Ability to disclose and discuss suicidal ideas will  improve Outcome: Progressing Goal: Will verbalize positive feelings about self Outcome: Progressing

## 2020-03-04 NOTE — Plan of Care (Signed)
  Problem: Education: Goal: Knowledge of [place] [facility] Education information/materials will improve Outcome: Progressing Goal: Emotional status will improve Outcome: Progressing Goal: Mental status will improve Outcome: Progressing Goal: Verbalization of understanding the information provided will improve Outcome: Progressing   Problem: Activity: Goal: Interest or engagement in activities will improve Outcome: Progressing Goal: Sleeping patterns will improve Outcome: Progressing   Problem: Coping: Goal: Ability to verbalize frustrations and anger appropriately will improve Outcome: Progressing Goal: Ability to demonstrate self-control will improve Outcome: Progressing   Problem: Health Behavior/Discharge Planning: Goal: Identification of resources available to assist in meeting health care needs will improve Outcome: Progressing Goal: Compliance with treatment plan for underlying cause of condition will improve Outcome: Progressing   Problem: Physical Regulation: Goal: Ability to maintain clinical measurements within normal limits will improve Outcome: Progressing   Problem: Safety: Goal: Periods of time without injury will increase Outcome: Progressing   Problem: Education: Goal: Ability to make informed decisions regarding treatment will improve Outcome: Progressing   Problem: Coping: Goal: Coping ability will improve Outcome: Progressing   Problem: Health Behavior/Discharge Planning: Goal: Identification of resources available to assist in meeting health care needs will improve Outcome: Progressing   Problem: Medication: Goal: Compliance with prescribed medication regimen will improve Outcome: Progressing   Problem: Self-Concept: Goal: Ability to disclose and discuss suicidal ideas will improve Outcome: Progressing Goal: Will verbalize positive feelings about self Outcome: Progressing   

## 2020-03-04 NOTE — Plan of Care (Signed)
  Problem: Education: Goal: Knowledge of [place] [facility] Education information/materials will improve Outcome: Progressing Goal: Emotional status will improve Outcome: Progressing Goal: Mental status will improve Outcome: Progressing Goal: Verbalization of understanding the information provided will improve Outcome: Progressing   Problem: Activity: Goal: Interest or engagement in activities will improve Outcome: Progressing Goal: Sleeping patterns will improve Outcome: Progressing   Problem: Coping: Goal: Ability to verbalize frustrations and anger appropriately will improve Outcome: Progressing Goal: Ability to demonstrate self-control will improve Outcome: Progressing   Problem: Health Behavior/Discharge Planning: Goal: Identification of resources available to assist in meeting health care needs will improve Outcome: Progressing Goal: Compliance with treatment plan for underlying cause of condition will improve Outcome: Progressing   Problem: Physical Regulation: Goal: Ability to maintain clinical measurements within normal limits will improve Outcome: Progressing   Problem: Safety: Goal: Periods of time without injury will increase Outcome: Progressing   Problem: Education: Goal: Ability to make informed decisions regarding treatment will improve Outcome: Progressing   Problem: Coping: Goal: Coping ability will improve Outcome: Progressing   Problem: Health Behavior/Discharge Planning: Goal: Identification of resources available to assist in meeting health care needs will improve Outcome: Progressing   Problem: Medication: Goal: Compliance with prescribed medication regimen will improve Outcome: Progressing   Problem: Self-Concept: Goal: Ability to disclose and discuss suicidal ideas will improve Outcome: Progressing Goal: Will verbalize positive feelings about self Outcome: Progressing   

## 2020-03-04 NOTE — Progress Notes (Signed)
D- Patient alert and oriented. Affect/mood is cooperative and calm. Pt denies SI, HI, AVH, and pain. Pt has attended groups and enjoyed his art work.   A- Scheduled medications administered to patient, per MD orders. Support and encouragement provided.  Routine safety checks conducted every 15 minutes.  Patient informed to notify staff with problems or concerns.  R- No adverse drug reactions noted. Patient contracts for safety at this time. Patient compliant with medications and treatment plan. Patient receptive, calm, and cooperative. Patient interacts well with others on the unit.  Patient remains safe at this time.  Collier Bullock RN

## 2020-03-04 NOTE — BHH Counselor (Signed)
CSW faxed FL2 to pt's supports at [facility] to assist with placement at the request of the patient and family.  CSW received confirmation the fax was successful.  Assunta Curtis, MSW, LCSW 03/04/2020 12:44 PM

## 2020-03-04 NOTE — Progress Notes (Signed)
Recreation Therapy Notes   Date: 03/04/2020  Time: 9:30 am  Location: Craft room   Behavioral response: Appropriate  Intervention Topic: Problem Solving    Discussion/Intervention:  Group content on today was focused on problem solving. The group described what problem solving is. Patients expressed how problems affect them and how they deal with problems. Individuals identified healthy ways to deal with problems. Patients explained what normally happens to them when they do not deal with problems. The group expressed reoccurring problems for them. The group participated in the intervention "Ways to Solve problems" where patients were given a chance to explore different ways to solve problems.  Clinical Observations/Feedback:  Patient came to group and defined problem solving as going through the proper authorities and ask for help. He stated that he problems solves by making notes and doing art work. Participant expressed that he tries to be more independent and solve his own problems. Individual was social with peers and staff while participating in the intervention. Tisheena Maguire LRT/CTRS          Ladashia Demarinis 03/04/2020 1:21 PM

## 2020-03-05 MED ORDER — TAMSULOSIN HCL 0.4 MG PO CAPS
0.8000 mg | ORAL_CAPSULE | Freq: Every day | ORAL | Status: DC
  Administered 2020-03-05 – 2020-06-07 (×95): 0.8 mg via ORAL
  Filled 2020-03-05 (×96): qty 2

## 2020-03-05 NOTE — Progress Notes (Signed)
Recreation Therapy Notes   Date: 03/05/2020  Time: 9:30 am  Location: Craft room   Behavioral response: Appropriate  Intervention Topic: Creative Expressions  Discussion/Intervention:  Group content on today was focused on creative expressions. The group defined creative expressions and ways they use creative expressions. Individual identified other positive ways creative expressions can be used and why it is important to express yourself. Patients participated in the intervention "expressive folding", where they had a chance to creatively express themselves. Clinical Observations/Feedback:  Patient came to group and explained that he expresses him self in a creative way by being polite and controlling his bad emotions.Individual was social with peers and staff while participating in the intervention. Navia Lindahl LRT/CTRS         Keva Darty 03/05/2020 12:49 PM

## 2020-03-05 NOTE — Plan of Care (Signed)
D: Patient alert and oriented X4. Affect is Euthymic and mood is calm with some anxiety though he rates his anxiety as 0/10. He denies SI, HI, AVH at this time. Denies pain and states his goal is to rest and stay engaged  by having self control and independence with his care.  A: Scheduled medications administered to patient per MD order. Pt. c/o frequent urination, MD aware. PRN MOM administered for c/o constipation pending results. Support and encouragement provided. Routine safety checks conducted every 15 minutes. Patient informed to notify staff with problems or concerns.   R: No adverse drug reactions noted. Patient contracts for safety at this time. Patient compliant with medications and treatment plan. Patient receptive, calm, and cooperative. Patient interacts well with others on the unit. Patient remains safe at this time.    Problem: Education: Goal: Knowledge of [place] [facility] Education information/materials will improve Outcome: Progressing Goal: Emotional status will improve Outcome: Progressing Goal: Mental status will improve Outcome: Progressing Goal: Verbalization of understanding the information provided will improve Outcome: Progressing   Problem: Activity: Goal: Interest or engagement in activities will improve Outcome: Progressing Goal: Sleeping patterns will improve Outcome: Progressing   Problem: Coping: Goal: Ability to verbalize frustrations and anger appropriately will improve Outcome: Progressing Goal: Ability to demonstrate self-control will improve Outcome: Progressing   Problem: Health Behavior/Discharge Planning: Goal: Identification of resources available to assist in meeting health care needs will improve Outcome: Progressing Goal: Compliance with treatment plan for underlying cause of condition will improve Outcome: Progressing   Problem: Physical Regulation: Goal: Ability to maintain clinical measurements within normal limits will  improve Outcome: Progressing   Problem: Safety: Goal: Periods of time without injury will increase Outcome: Progressing   Problem: Education: Goal: Ability to make informed decisions regarding treatment will improve Outcome: Progressing   Problem: Coping: Goal: Coping ability will improve Outcome: Progressing   Problem: Health Behavior/Discharge Planning: Goal: Identification of resources available to assist in meeting health care needs will improve Outcome: Progressing   Problem: Medication: Goal: Compliance with prescribed medication regimen will improve Outcome: Progressing   Problem: Self-Concept: Goal: Ability to disclose and discuss suicidal ideas will improve Outcome: Progressing Goal: Will verbalize positive feelings about self Outcome: Progressing

## 2020-03-05 NOTE — BHH Counselor (Signed)
CSW contacted the following to locate possible bed placement/follow up on previous phone call messages:    [facility], [phone] Ms. Randal Buba reports that her phone is dying and she will call this SW when she is able to charge her phone some.  Dellis Anes, [phone] One male bed available.  Concerned about the wandering and [place].  Wants a WebEx, [email or web address] for  Monday at [place], [place], [phone] Declined pt due to Covid numbers increase and patient has a history of elopement behaviors.   Alla Feeling, [place], [phone] CSW left HIPAA compliant voicemail at 3:27PM.  Manon Hilding, [phone] Call was disconnected and attempts to call back were not fruitful.  Josepha Pigg, [place], [phone] Call rang without end, no option to leave a voicemail message.  Assunta Curtis, MSW, LCSW 03/05/2020 3:28 PM

## 2020-03-05 NOTE — Progress Notes (Signed)
D- Patient alert and oriented. Affect/mood is pleasant, cooperative and calm. Pt denies SI, HI, AVH, and pain. Pt is proud of himself and realizes how much he has improved. Pt attended all groups.   A- Scheduled medications administered to patient, per MD orders. Support and encouragement provided.  Routine safety checks conducted every 15 minutes.  Patient informed to notify staff with problems or concerns.  R- No adverse drug reactions noted. Patient contracts for safety at this time. Patient compliant with medications and treatment plan. Patient receptive, calm, and cooperative. Patient interacts well with others on the unit.  Patient remains safe at this time.  Collier Bullock RN

## 2020-03-05 NOTE — Progress Notes (Signed)
BRIEF PHARMACY NOTE   This patient attended and participated in Medication Management Group counseling led by [facility] staff pharmacist.  This interactive class reviews basic information about prescription medications and education on personal responsibility in medication management.  The class also includes general knowledge of 3 main classes of behavioral medications, including antipsychotics, antidepressants, and mood stabilizers.     Patient behavior was appropriate for group setting.   Educational materials sourced from:  "Medication Do's and Don'ts" from [email or web address]   "Mental Health Medications" from [email or web address] [email or web address]#part [place] ,PharmD 03/05/2020 , 3:35 PM

## 2020-03-05 NOTE — Progress Notes (Signed)
[facility] MD Progress Note  03/05/2020 4:34 PM Alexander Beard  MRN:  [id number] Subjective: Follow-up for this gentleman with schizoaffective disorder developmental disability.  Today he came to me all in and anxious tizzy after attending medication group with the pharmacist.  Nothing in particular he was worried about but anytime you give him a form it gets him worked up.  His main issue that he wanted to talk about really was that he continues to have frequent enuresis.  Mood is generally okay behavior is been fine. Principal Problem: Schizoaffective disorder, bipolar type ([place]) Diagnosis: Principal Problem:   Schizoaffective disorder, bipolar type ([place]) Active Problems:   Active autistic disorder   Diabetes ([place])   Hypertension   Prostate hypertrophy  Total Time spent with patient: 30 minutes  Past Psychiatric History: Longstanding history of chronic disability as regularly documented  Past Medical History:  Past Medical History:  Diagnosis Date  . Anxiety   . Anxiety disorder due to known physiological condition    HOSPITALIZED 10/18  . Arthritis   . Autistic disorder, residual state   . COPD (chronic obstructive pulmonary disease) ([place])   . Depression   . Developmental disorder   . Diabetes mellitus without complication ([place])   . Dyslipidemia   . Esophageal reflux   . HOH (hard of hearing)    MILDLY  . Hypertension   . Obesity   . Overactive bladder   . Palpitations    ANXIETY  . Schizophrenia, schizoaffective ([place])   . Sleep apnea   . Tremors of nervous system    HANDS DUE TO MEDICATIONS  . Urinary incontinence     Past Surgical History:  Procedure Laterality Date  . CATARACT EXTRACTION W/PHACO Left 11/14/2017   Procedure: CATARACT EXTRACTION PHACO AND INTRAOCULAR LENS PLACEMENT (IOC);  Surgeon: Birder Robson, MD;  Location: [facility] ORS;  Service: Ophthalmology;  Laterality: Left;  [place] 00:42 AP% 14.6 CDE 6.12 Fluid pack lot # [id number]  . COLONOSCOPY  01/28/2005  .  COLONOSCOPY WITH PROPOFOL N/A 05/09/2016   Procedure: COLONOSCOPY WITH PROPOFOL;  Surgeon: Manya Silvas, MD;  Location: [facility] ENDOSCOPY;  Service: Endoscopy;  Laterality: N/A;  . ESOPHAGOGASTRODUODENOSCOPY N/A 05/09/2016   Procedure: ESOPHAGOGASTRODUODENOSCOPY (EGD);  Surgeon: Manya Silvas, MD;  Location[facility]COPY;  Service: Endoscopy;  Laterality: N/A;  . FRACTURE SURGERY     ORIF SHOULDER  . TONSILLECTOMY     Family History:  Family History  Problem Relation Age of Onset  . Hypertension Mother   . Stroke Father   . Heart Problems Father    Family Psychiatric  History: See previous Social History:  Social History   Substance and Sexual Activity  Alcohol Use No  . Alcohol/week: 0.0 standard drinks     Social History   Substance and Sexual Activity  Drug Use No    Social History   Socioeconomic History  . Marital status: Single    Spouse name: Not on file  . Number of children: Not on file  . Years of education: Not on file  . Highest education level: Not on file  Occupational History  . Not on file  Tobacco Use  . Smoking status: Never Smoker  . Smokeless tobacco: Never Used  Vaping Use  . Vaping Use: Never used  Substance and Sexual Activity  . Alcohol use: No    Alcohol/week: 0.0 standard drinks  . Drug use: No  . Sexual activity: Never  Other Topics Concern  . Not on file  Social History Narrative   The patient never finished high school but did get his GED. 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 He is currently in disability and [name] is his legal guardian. He has been living in group homes for many years.      No pending legal charges   Social Determinants of Health   Financial Resource Strain:   . Difficulty of Paying Living Expenses: Not on file  Food Insecurity:   . Worried About Charity fundraiser in the Last Year: Not on file  . Ran Out of Food in the Last  Year: Not on file  Transportation Needs:   . Lack of Transportation (Medical): Not on file  . Lack of Transportation (Non-Medical): Not on file  Physical Activity:   . Days of Exercise per Week: Not on file  . Minutes of Exercise per Session: Not on file  Stress:   . Feeling of Stress : Not on file  Social Connections:   . Frequency of Communication with Friends and Family: Not on file  . Frequency of Social Gatherings with Friends and Family: Not on file  . Attends Religious Services: Not on file  . Active Member of Clubs or Organizations: Not on file  . Attends Archivist Meetings: Not on file  . Marital Status: Not on file   Additional Social History:                         Sleep: Negative  Appetite:  Negative  Current Medications: Current Facility-Administered Medications  Medication Dose Route Frequency Provider Last Rate Last Admin  . acetaminophen (TYLENOL) tablet 650 mg  650 mg Oral Q6H PRN [name], MD      . alum & mag hydroxide-simeth (MAALOX/MYLANTA) 200-200-20 MG/5ML suspension 30 mL  30 mL Oral Q4H PRN [name], MD      . aspirin EC tablet 81 mg  81 mg Oral Daily Joyous Gleghorn, [name], MD   81 mg at 03/05/20 0949  . clonazePAM (KLONOPIN) tablet 1 mg  1 mg Oral QID Avarae Zwart, [name], MD   1 mg at 03/05/20 1230  . docusate sodium (COLACE) capsule 100 mg  100 mg Oral BID Lemond Griffee, [name], MD   100 mg at 03/05/20 0950  . gabapentin (NEURONTIN) capsule 100 mg  100 mg Oral TID Waverly Chavarria T, MD   100 mg at 03/05/20 1230  . hydrOXYzine (ATARAX/VISTARIL) tablet 10 mg  10 mg Oral TID PRN [name], MD   10 mg at 02/20/20 2127  . lamoTRIgine (LAMICTAL) tablet 100 mg  100 mg Oral QHS Aftin Lye T, MD   100 mg at 03/04/20 2135  . linagliptin (TRADJENTA) tablet 5 mg  5 mg Oral Daily Emagene Merfeld, [name], MD   5 mg at 03/05/20 0950  . loratadine (CLARITIN) tablet 10 mg  10 mg Oral Daily Danaija Eskridge, [name], MD   10 mg at 03/05/20 0951  . magnesium  hydroxide (MILK OF MAGNESIA) suspension 30 mL  30 mL Oral Daily PRN [name], MD   30 mL at 03/05/20 0959  . metFORMIN (GLUCOPHAGE) tablet 850 mg  850 mg Oral BID WC Katori Wirsing, [name], MD   850 mg at 03/05/20 0952  . metoprolol succinate (TOPROL-XL) 24 hr tablet 50 mg  50 mg Oral Daily Sanita Estrada, [name], MD   50 mg  at 03/05/20 0953  . oxybutynin (DITROPAN) tablet 10 mg  10 mg Oral BID Tanush Drees, [name], MD   10 mg at 03/05/20 0954  . pantoprazole (PROTONIX) EC tablet 40 mg  40 mg Oral Daily Phillips Goulette, [name], MD   40 mg at 03/05/20 0951  . QUEtiapine (SEROQUEL) tablet 100 mg  100 mg Oral QID Sherae Santino T, MD   100 mg at 03/05/20 1230  . QUEtiapine (SEROQUEL) tablet 400 mg  400 mg Oral QHS [name], NP   400 mg at 03/04/20 2135  . sertraline (ZOLOFT) tablet 100 mg  100 mg Oral BID Ricardo Kayes, [name], MD   100 mg at 03/05/20 0952  . simvastatin (ZOCOR) tablet 20 mg  20 mg Oral q1800 Krisanne Lich, [name], MD   20 mg at 03/04/20 1750  . tamsulosin (FLOMAX) capsule 0.8 mg  0.8 mg Oral QPC supper Selden Noteboom, [name], MD        Lab Results: No results found for this or any previous visit (from the past 48 hour(s)).  Blood Alcohol level:  Lab Results  Component Value Date   ETH <10 01/22/2020   ETH <10 63/84/6659    Metabolic Disorder Labs: Lab Results  Component Value Date   HGBA1C 6.6 (H) 02/20/2020   MPG 142.72 02/20/2020   No results found for: PROLACTIN Lab Results  Component Value Date   CHOL 153 04/10/2015   TRIG 166 (H) 04/10/2015   HDL 50 04/10/2015   CHOLHDL 3.1 04/10/2015   VLDL 33 04/10/2015   LDLCALC 70 04/10/2015    Physical Findings: AIMS:  , ,  ,  ,    CIWA:    COWS:     Musculoskeletal: Strength & Muscle Tone: within normal limits Gait & Station: normal Patient leans: N/A  Psychiatric Specialty Exam: Physical Exam Vitals and nursing note reviewed.  Constitutional:      Appearance: He is well-developed.  HENT:     Head: Normocephalic and atraumatic.   Eyes:     Conjunctiva/sclera: Conjunctivae normal.     Pupils: Pupils are equal, round, and reactive to light.  Cardiovascular:     Heart sounds: Normal heart sounds.  Pulmonary:     Effort: Pulmonary effort is normal.  Abdominal:     Palpations: Abdomen is soft.  Musculoskeletal:        General: Normal range of motion.     Cervical back: Normal range of motion.  Skin:    General: Skin is warm and dry.  Neurological:     General: No focal deficit present.     Mental Status: He is alert.  Psychiatric:        Attention and Perception: He is inattentive.        Mood and Affect: Mood is anxious.        Speech: Speech is rapid and pressured.        Behavior: Behavior is agitated. Behavior is not aggressive.        Thought Content: Thought content is not paranoid or delusional. Thought content does not include homicidal or suicidal ideation.        Cognition and Memory: Cognition is impaired.        Judgment: Judgment is impulsive and inappropriate.     Review of Systems  Constitutional: Negative.   HENT: Negative.   Eyes: Negative.   Respiratory: Negative.   Cardiovascular: Negative.   Gastrointestinal: Negative.   Musculoskeletal: Negative.   Skin: Negative.   Neurological: Negative.  Psychiatric/Behavioral: Positive for confusion. Negative for agitation. The patient is hyperactive.     Blood pressure 124/77, pulse 68, temperature 98.6 F (37 C), temperature source Oral, resp. rate 18, height 5\' 8"  (1.727 m), weight 104.3 kg, SpO2 98 %.Body mass index is 34.96 kg/m.  General Appearance: Casual  Eye Contact:  Fair  Speech:  Clear and Coherent  Volume:  Normal  Mood:  Anxious  Affect:  Congruent  Thought Process:  Disorganized  Orientation:  Full (Time, Place, and Person)  Thought Content:  Rumination and Tangential  Suicidal Thoughts:  No  Homicidal Thoughts:  No  Memory:  Immediate;   Fair Recent;   Fair Remote;   Fair  Judgement:  Impaired  Insight:  Shallow   Psychomotor Activity:  Restlessness  Concentration:  Concentration: Poor  Recall:  [employer] of Knowledge:  Fair  Language:  Fair  Akathisia:  No  Handed:  Right  AIMS (if indicated):     Assets:  Desire for Improvement Social Support  ADL's:  Impaired  Cognition:  Impaired,  Mild  Sleep:  Number of Hours: 6.15     Treatment Plan Summary: Daily contact with patient to assess and evaluate symptoms and progress in treatment, Medication management and Plan Pretty stable.  I will double his Flomax dose to 8 mg.  If he still complaining we can reconsult the urology people.  Supportive therapy.  Still no sign of any placement issues although he is really doing pretty well for himself.  Alethia Berthold, MD 03/05/2020, 4:34 PM

## 2020-03-05 NOTE — Progress Notes (Signed)
Patient less agitated this evening compared to last night. Patient denies SI/HI/AVH and pain. Patient stated he feels the best tonight that he has in a long time. Patient compliant with medication administration per MD orders. Patient given education, support, and encouragement to be active in his treatment plan. Patient being monitored Q 15 minutes for safety per unit protocol. Patient remains safe on the unit.Patient presents with mild confusion but is easily redirectable.Patient is less preoccupied with his artwork. Patient given education and support.

## 2020-03-05 NOTE — Care Management (Addendum)
[address], [place] and [place] [phone], [phone]- no beds available  [facility], [place] beds available  [facility], Ivin Booty [phone] must be linked with [facility] registry  [place] and [place] -[phone]- left HIPPA compliant voicemail   [facility]-[phone] no beds available  [place] , Rudell Cobb [phone] HIPPA compliant voicemail  [facility] [phone] no beds available   [facility]- [phone]- declined due to concerns about wandering, suggested [facility], [place] compliant [address], Janetta Hora [phone]- faxed FL2 to [place] and [facility], [place] Dieter [phone] faxed FL2 to [phone] to confirm fax was received, will get back with SWA by tomorrow 03/06/20- declined due to wandering tendencies

## 2020-03-05 NOTE — Progress Notes (Signed)
Patient is alert and oriented X4. He continues to exhibit anxiety but is managing his symptoms.  He is active on the unit and engages well with other peers as well as coloring and drawing to help pass the time. He denies SI/H/AVH depression and pain at this encounter. He is med compliant and tolerates his meds without incident. He remains safe on the unit with 15 minute safety checks and informed to contact staff with any concerns.     Cleo Butler-Nicholson, LPN

## 2020-03-05 NOTE — Plan of Care (Signed)
Patient presents with less anxiety and depression. Calm and pleasant on the unit  Problem: Education: Goal: Emotional status will improve Outcome: Progressing Goal: Mental status will improve Outcome: Progressing

## 2020-03-06 DIAGNOSIS — F79 Unspecified intellectual disabilities: Secondary | ICD-10-CM

## 2020-03-06 DIAGNOSIS — Z9189 Other specified personal risk factors, not elsewhere classified: Secondary | ICD-10-CM

## 2020-03-06 MED ORDER — SIMETHICONE 80 MG PO CHEW
80.0000 mg | CHEWABLE_TABLET | Freq: Three times a day (TID) | ORAL | Status: DC
  Administered 2020-03-06 – 2020-06-08 (×277): 80 mg via ORAL
  Filled 2020-03-06 (×278): qty 1

## 2020-03-06 MED ORDER — PRAZOSIN HCL 1 MG PO CAPS
1.0000 mg | ORAL_CAPSULE | Freq: Every day | ORAL | Status: DC
  Administered 2020-03-06 – 2020-06-06 (×43): 1 mg via ORAL
  Filled 2020-03-06 (×72): qty 1

## 2020-03-06 NOTE — [facility] Group Notes (Signed)
[place] Group Notes:  (Nursing/MHT/Case Management/Adjunct)  Date:  03/06/2020  Time:  9:18 PM  Type of Therapy:  Group Therapy  Participation Level:  Active  Participation Quality:  Redirectable  Affect:  Appropriate  Cognitive:  Alert  Insight:  Good  Engagement in Group:  Engaged and not listening to the question,talking about his goal before time.  Modes of Intervention:  Support  Summary of Progress/Problems:  Alexander Beard 03/06/2020, 9:18 PM

## 2020-03-06 NOTE — Plan of Care (Signed)
D- Patient alert and oriented. Patient presented in a pleasant mood on assessment stating that he slept good last night and had no complaints to voice to this Probation officer. Patient denied any signs/symptoms of depression/anxiety, however, he has been extremely preoccupied with his menu and wanting juice and chocolate-chip cookies. Patient also denied SI, HI, AVH, and pain at this time. Patient had no stated goals for today.  A- Scheduled medications administered to patient, per MD orders. Support and encouragement provided.  Routine safety checks conducted every 15 minutes.  Patient informed to notify staff with problems or concerns.  R- No adverse drug reactions noted. Patient contracts for safety at this time. Patient compliant with medications and treatment plan. Patient receptive, calm, and cooperative. Patient interacts well with others on the unit.  Patient remains safe at this time.  Problem: Education: Goal: Knowledge of [place] [facility] Education information/materials will improve Outcome: Progressing Goal: Emotional status will improve Outcome: Progressing Goal: Mental status will improve Outcome: Progressing Goal: Verbalization of understanding the information provided will improve Outcome: Progressing   Problem: Activity: Goal: Interest or engagement in activities will improve Outcome: Progressing Goal: Sleeping patterns will improve Outcome: Progressing   Problem: Coping: Goal: Ability to verbalize frustrations and anger appropriately will improve Outcome: Progressing Goal: Ability to demonstrate self-control will improve Outcome: Progressing   Problem: Health Behavior/Discharge Planning: Goal: Identification of resources available to assist in meeting health care needs will improve Outcome: Progressing Goal: Compliance with treatment plan for underlying cause of condition will improve Outcome: Progressing   Problem: Physical Regulation: Goal: Ability to maintain clinical  measurements within normal limits will improve Outcome: Progressing   Problem: Safety: Goal: Periods of time without injury will increase Outcome: Progressing   Problem: Education: Goal: Ability to make informed decisions regarding treatment will improve Outcome: Progressing   Problem: Coping: Goal: Coping ability will improve Outcome: Progressing   Problem: Health Behavior/Discharge Planning: Goal: Identification of resources available to assist in meeting health care needs will improve Outcome: Progressing   Problem: Medication: Goal: Compliance with prescribed medication regimen will improve Outcome: Progressing   Problem: Self-Concept: Goal: Ability to disclose and discuss suicidal ideas will improve Outcome: Progressing Goal: Will verbalize positive feelings about self Outcome: Progressing

## 2020-03-06 NOTE — Plan of Care (Signed)
Patient has been in the milieu, frequently visiting the nurses station to ask repetitive questions. Pleasant and redirectable. Denying suicidal/homicidal thoughts. Denying hallucinations. Has mild confusion. Patient had a snack and received bedtime medications. Had no major concerns.

## 2020-03-06 NOTE — Progress Notes (Signed)
[name] MD Progress Note  03/06/2020 5:00 PM PER BEAGLEY  MRN:  [id number] Subjective: Patient seen chart reviewed.  Alexander Beard was in good spirits today.  He reviewed how he had been keeping himself busy coloring and that sort of thing since he has been here.  He talked with me about his fear that wherever he goes in the future he will not have the kind of enjoyable activities he likes to do.  Underneath that he was able to actually express some fear and anxiety in a way about Alexander Beard's health.  Medically he appears to be doing well.  As noted previously his blood sugars are under good enough control that we do not need to do regular fingersticks.  He still has enuresis but he says that he has been making it to the bathroom regularly.  Sitting down and reviewing some information with him I am reminded of the real limitations he has in reading and being able to think logically through a process.  No suicidal statements no aggression no major behavior problem Principal Problem: Schizoaffective disorder, bipolar type ([place]) Diagnosis: Principal Problem:   Schizoaffective disorder, bipolar type ([place]) Active Problems:   Active autistic disorder   Diabetes ([place])   Hypertension   Prostate hypertrophy   Intellectual disability   At risk for elopement  Total Time spent with patient: 30 minutes  Past Psychiatric History: Patient has a lifelong history of disability with intellectual cognitive problems associated at times with some behavior issues.  Has needed lifelong care  Past Medical History:  Past Medical History:  Diagnosis Date  . Anxiety   . Anxiety disorder due to known physiological condition    HOSPITALIZED 10/18  . Arthritis   . Autistic disorder, residual state   . COPD (chronic obstructive pulmonary disease) ([place])   . Depression   . Developmental disorder   . Diabetes mellitus without complication ([place])   . Dyslipidemia   . Esophageal reflux   . HOH (hard of hearing)    MILDLY  .  Hypertension   . Obesity   . Overactive bladder   . Palpitations    ANXIETY  . Schizophrenia, schizoaffective ([place])   . Sleep apnea   . Tremors of nervous system    HANDS DUE TO MEDICATIONS  . Urinary incontinence     Past Surgical History:  Procedure Laterality Date  . CATARACT EXTRACTION W/PHACO Left 11/14/2017   Procedure: CATARACT EXTRACTION PHACO AND INTRAOCULAR LENS PLACEMENT (IOC);  Surgeon: Birder Robson, MD;  Location: [facility] ORS;  Service: Ophthalmology;  Laterality: Left;  [place] 00:42 AP% 14.6 CDE 6.12 Fluid pack lot # [id number]  . COLONOSCOPY  01/28/2005  . COLONOSCOPY WITH PROPOFOL N/A 05/09/2016   Procedure: COLONOSCOPY WITH PROPOFOL;  Surgeon: Manya Silvas, MD;  Location: [facility] ENDOSCOPY;  Service: Endoscopy;  Laterality: N/A;  . ESOPHAGOGASTRODUODENOSCOPY N/A 05/09/2016   Procedure: ESOPHAGOGASTRODUODENOSCOPY (EGD);  Surgeon: Manya Silvas, MD;  Location: [facility] ENDOSCOPY;  Service: Endoscopy;  Laterality: N/A;  . FRACTURE SURGERY     ORIF SHOULDER  . TONSILLECTOMY     Family History:  Family History  Problem Relation Age of Onset  . Hypertension Mother   . Stroke Father   . Heart Problems Father    Family Psychiatric  History: None reported Social History:  Social History   Substance and Sexual Activity  Alcohol Use No  . Alcohol/week: 0.0 standard drinks     Social History   Substance and Sexual Activity  Drug Use No  Social History   Socioeconomic History  . Marital status: Single    Spouse name: Not on file  . Number of children: Not on file  . Years of education: Not on file  . Highest education level: Not on file  Occupational History  . Not on file  Tobacco Use  . Smoking status: Never Smoker  . Smokeless tobacco: Never Used  Vaping Use  . Vaping Use: Never used  Substance and Sexual Activity  . Alcohol use: No    Alcohol/week: 0.0 standard drinks  . Drug use: No  . Sexual activity: Never  Other Topics Concern  . Not on file   Social History Narrative   The patient never finished high school but did get his GED. 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 He is currently in disability and [name] is his legal guardian. He has been living in group homes for many years.      No pending legal charges   Social Determinants of Health   Financial Resource Strain:   . Difficulty of Paying Living Expenses: Not on file  Food Insecurity:   . Worried About Charity fundraiser in the Last Year: Not on file  . Ran Out of Food in the Last Year: Not on file  Transportation Needs:   . Lack of Transportation (Medical): Not on file  . Lack of Transportation (Non-Medical): Not on file  Physical Activity:   . Days of Exercise per Week: Not on file  . Minutes of Exercise per Session: Not on file  Stress:   . Feeling of Stress : Not on file  Social Connections:   . Frequency of Communication with Friends and Family: Not on file  . Frequency of Social Gatherings with Friends and Family: Not on file  . Attends Religious Services: Not on file  . Active Member of Clubs or Organizations: Not on file  . Attends Archivist Meetings: Not on file  . Marital Status: Not on file   Additional Social History:                         Sleep: Fair  Appetite:  Fair  Current Medications: Current Facility-Administered Medications  Medication Dose Route Frequency Provider Last Rate Last Admin  . acetaminophen (TYLENOL) tablet 650 mg  650 mg Oral Q6H PRN [name], MD      . alum & mag hydroxide-simeth (MAALOX/MYLANTA) 200-200-20 MG/5ML suspension 30 mL  30 mL Oral Q4H PRN [name], MD      . aspirin EC tablet 81 mg  81 mg Oral Daily Asyia Hornung, [name], MD   81 mg at 03/06/20 0853  . clonazePAM (KLONOPIN) tablet 1 mg  1 mg Oral QID Aliciana Ricciardi, [name], MD   1 mg at 03/06/20 1228  . docusate sodium (COLACE) capsule 100 mg  100 mg Oral BID  Cornelia Walraven, [name], MD   100 mg at 03/06/20 0853  . gabapentin (NEURONTIN) capsule 100 mg  100 mg Oral TID Tu Bayle T, MD   100 mg at 03/06/20 1228  . hydrOXYzine (ATARAX/VISTARIL) tablet 10 mg  10 mg Oral TID PRN [name], MD   10 mg at 02/20/20 2127  . lamoTRIgine (LAMICTAL) tablet 100 mg  100 mg Oral QHS Renee Beale, [name], MD   100 mg at 03/05/20 2119  . linagliptin (TRADJENTA) tablet 5 mg  5 mg Oral Daily Erasmus Bistline, [name], MD   5 mg at 03/06/20 0853  . loratadine (CLARITIN) tablet 10 mg  10 mg Oral Daily Bastien Strawser, [name], MD   10 mg at 03/06/20 0852  . magnesium hydroxide (MILK OF MAGNESIA) suspension 30 mL  30 mL Oral Daily PRN [name], MD   30 mL at 03/06/20 1228  . metFORMIN (GLUCOPHAGE) tablet 850 mg  850 mg Oral BID WC Mishaal Lansdale, [name], MD   850 mg at 03/06/20 0853  . metoprolol succinate (TOPROL-XL) 24 hr tablet 50 mg  50 mg Oral Daily Jihaad Bruschi, [name], MD   50 mg at 03/06/20 0852  . oxybutynin (DITROPAN) tablet 10 mg  10 mg Oral BID Maximilien Hayashi, [name], MD   10 mg at 03/06/20 0852  . pantoprazole (PROTONIX) EC tablet 40 mg  40 mg Oral Daily Filomeno Cromley, [name], MD   40 mg at 03/06/20 0853  . prazosin (MINIPRESS) capsule 1 mg  1 mg Oral QHS Annaclaire Walsworth T, MD      . QUEtiapine (SEROQUEL) tablet 100 mg  100 mg Oral QID Ignacio Lowder T, MD   100 mg at 03/06/20 1227  . QUEtiapine (SEROQUEL) tablet 400 mg  400 mg Oral QHS [name], NP   400 mg at 03/05/20 2117  . sertraline (ZOLOFT) tablet 100 mg  100 mg Oral BID Apollo Timothy, [name], MD   100 mg at 03/06/20 0852  . simethicone (MYLICON) chewable tablet 80 mg  80 mg Oral TID Gabriell Casimir T, MD      . simvastatin (ZOCOR) tablet 20 mg  20 mg Oral q1800 Daena Alper, [name], MD   20 mg at 03/05/20 1703  . tamsulosin (FLOMAX) capsule 0.8 mg  0.8 mg Oral QPC supper Jazlyne Gauger T, MD   0.8 mg at 03/05/20 1734    Lab Results: No results found for this or any previous visit (from the past 48 hour(s)).  Blood Alcohol level:  Lab  Results  Component Value Date   ETH <10 01/22/2020   ETH <10 60/63/0160    Metabolic Disorder Labs: Lab Results  Component Value Date   HGBA1C 6.6 (H) 02/20/2020   MPG 142.72 02/20/2020   No results found for: PROLACTIN Lab Results  Component Value Date   CHOL 153 04/10/2015   TRIG 166 (H) 04/10/2015   HDL 50 04/10/2015   CHOLHDL 3.1 04/10/2015   VLDL 33 04/10/2015   LDLCALC 70 04/10/2015    Physical Findings: AIMS:  , ,  ,  ,    CIWA:    COWS:     Musculoskeletal: Strength & Muscle Tone: within normal limits Gait & Station: normal Patient leans: N/A  Psychiatric Specialty Exam: Physical Exam Vitals and nursing note reviewed.  Constitutional:      Appearance: He is well-developed.  HENT:     Head: Normocephalic and atraumatic.  Eyes:     Conjunctiva/sclera: Conjunctivae normal.     Pupils: Pupils are equal, round, and reactive to light.  Cardiovascular:     Heart sounds: Normal heart sounds.  Pulmonary:     Effort: Pulmonary effort is normal.  Abdominal:     Palpations: Abdomen is soft.  Musculoskeletal:        General: Normal range of motion.     Cervical back: Normal range of motion.  Skin:    General: Skin is warm and dry.  Neurological:     General: No focal deficit present.  Mental Status: He is alert.  Psychiatric:        Attention and Perception: He is inattentive.        Mood and Affect: Mood is anxious. Affect is labile.        Speech: Speech is tangential.        Behavior: Behavior is agitated. Behavior is not aggressive or combative.        Thought Content: Thought content is not paranoid. Thought content does not include homicidal or suicidal ideation.        Cognition and Memory: Cognition is impaired.        Judgment: Judgment is impulsive and inappropriate.     Review of Systems  Constitutional: Negative.   HENT: Negative.   Eyes: Negative.   Respiratory: Negative.   Cardiovascular: Negative.   Gastrointestinal: Negative.    Musculoskeletal: Negative.   Skin: Negative.   Neurological: Negative.   Psychiatric/Behavioral: Positive for confusion. The patient is nervous/anxious.     Blood pressure 123/74, pulse (!) 53, temperature (!) 97.5 F (36.4 C), temperature source Oral, resp. rate 17, height 5\' 8"  (1.727 m), weight 104.3 kg, SpO2 99 %.Body mass index is 34.96 kg/m.  General Appearance: Casual  Eye Contact:  Fair  Speech:  Clear and Coherent  Volume:  Normal  Mood:  Anxious  Affect:  Congruent  Thought Process:  Goal Directed  Orientation:  Full (Time, Place, and Person)  Thought Content:  Tangential  Suicidal Thoughts:  No  Homicidal Thoughts:  No  Memory:  Immediate;   Fair Recent;   Fair Remote;   Fair  Judgement:  Impaired  Insight:  Shallow  Psychomotor Activity:  Normal  Concentration:  Concentration: Poor  Recall:  [employer] of Knowledge:  Fair  Language:  Fair  Akathisia:  No  Handed:  Right  AIMS (if indicated):     Assets:  Communication Skills Desire for Improvement Financial Resources/Insurance Resilience Social Support  ADL's:  Impaired  Cognition:  Impaired,  Mild  Sleep:  Number of Hours: 3.5     Treatment Plan Summary: Daily contact with patient to assess and evaluate symptoms and progress in treatment, Medication management and Plan It is unfortunate that we do not have the ability to do specific neurocognitive testing here in the hospital as I am sure that it would reveal more detail about his limitations.  Patient is clearly cognitively impaired compared to the normal population and probably has had some gradual worsening over the years as well.  Continues to need care for safe survival.  The only change to his medicine I am making today is adding a small amount of Minipress at night as an extra help for his enuresis.  Lots of supportive counseling.  I have made a point of including intellectual disability and his problem list which I think is absolutely true.  I will  continue to keep up with the treatment team about what can be done to help with ultimate discharge plans  Alethia Berthold, MD 03/06/2020, 5:00 PM

## 2020-03-06 NOTE — BHH Counselor (Signed)
CSW faxed the information to Renato Shin at [facility] at request of pt and pt's brother and to [place] at [facility], at [phone].    CSW received confirmation the fax was successful.  Assunta Curtis, MSW, LCSW 03/06/2020 3:46 PM

## 2020-03-07 NOTE — Progress Notes (Signed)
D: Patient presents alert and oriented. Verbalizes he slept good last night. Denied any physical pain. Denies SI,HI,& AVH. Has been preoccupied with his drawings. Verbally contracts for safety. Patient verbalized his goal would be to break away from his drawings.   A: Reassurance, support and encouragement provided. Maintained unit 15 minute safety checks. Medications administered per MD orders. No adverse medication reactions noted. Informed [name] to notify staff with any concerns or questions. Encouraged Alexander Beard to set time in his schedule for him to break away from his drawings so that he could redirect his thoughts and actions, he stated that what he wants to do and would try.   R: Patient has been medication compliant, no adverse drug reactions noted. Interacts well with others in milieu. Patient remains safe at this time.

## 2020-03-07 NOTE — [facility] Group Notes (Signed)
LCSW Group Therapy Note      03/07/2020:  1:18 PM- 2:12 PM                 Type of Therapy and Topic:  Group Therapy: Establishing Boundaries    Participation Level:  None      In this group, patients learned how to define boundaries, discussed the different types or boundaries with examples.  They identified times that boundaries had been violated and how they reacted.  They analyzed how their reaction was possibly beneficial and how it was possibly unhelpful.  The group discussed how to set boundaries, respect others boundaries and communicate their boundaries. The group utilized a role play scenarios (working with a partner) and discussed how each person in the scenario could have reacted differently and what boundaries they need to implement to improve their life. Patients also discussed consequences to overstepping boundaries and lack of boundaries. Patients discussed how to establish boundaries with clear consequences. Patients will explore discussion questions that address media influence and why it is hard to set boundaries.       Therapeutic Goals:   Patients will define boundaries and explore (physical, personal space and language boundaries).  Patients will remember their last incident where their boundaries were violated and how they behaved.  Patients will practice empathy and understanding of other's boundaries and learn from others in group.  Patients will explore how they may have crossed another person's boundaries in the past.   Patients will learn healthy ways to set and communicate boundaries.  Patients will actively engage in group activity utilizing role play and critical thinking skills.      Summary of Patient Progress:  Patient came at the end of group. Patient asked what the group was about today and apologized for being late.      Therapeutic Modalities:     Cognitive Behavioral Therapy    [name], Latanya Presser  03/07/20

## 2020-03-07 NOTE — Tx Team (Signed)
Interdisciplinary Treatment and Diagnostic Plan Update  03/07/2020 Time of Session: 9:45 AM Alexander Beard MRN: [id number]  Principal Diagnosis: Schizoaffective disorder, bipolar type [facility])  Secondary Diagnoses: Principal Problem:   Schizoaffective disorder, bipolar type ([place]) Active Problems:   Active autistic disorder   Diabetes ([place])   Hypertension   Prostate hypertrophy   Intellectual disability   At risk for elopement   Current Medications:  Current Facility-Administered Medications  Medication Dose Route Frequency Provider Last Rate Last Admin  . acetaminophen (TYLENOL) tablet 650 mg  650 mg Oral Q6H PRN [name], MD      . alum & mag hydroxide-simeth (MAALOX/MYLANTA) 200-200-20 MG/5ML suspension 30 mL  30 mL Oral Q4H PRN [name], MD      . aspirin EC tablet 81 mg  81 mg Oral Daily [name], [name], MD   81 mg at 03/07/20 0810  . clonazePAM (KLONOPIN) tablet 1 mg  1 mg Oral QID [name], [name], MD   1 mg at 03/07/20 0813  . docusate sodium (COLACE) capsule 100 mg  100 mg Oral BID [name], [name], MD   100 mg at 03/07/20 0811  . gabapentin (NEURONTIN) capsule 100 mg  100 mg Oral TID [name], [name], MD   100 mg at 03/07/20 0813  . hydrOXYzine (ATARAX/VISTARIL) tablet 10 mg  10 mg Oral TID PRN [name], MD   10 mg at 02/20/20 2127  . lamoTRIgine (LAMICTAL) tablet 100 mg  100 mg Oral QHS [name], [name], MD   100 mg at 03/06/20 2114  . linagliptin (TRADJENTA) tablet 5 mg  5 mg Oral Daily [name], [name], MD   5 mg at 03/07/20 0811  . loratadine (CLARITIN) tablet 10 mg  10 mg Oral Daily [name], [name], MD   10 mg at 03/07/20 0813  . magnesium hydroxide (MILK OF MAGNESIA) suspension 30 mL  30 mL Oral Daily PRN [name], MD   30 mL at 03/06/20 1228  . metFORMIN (GLUCOPHAGE) tablet 850 mg  850 mg Oral BID WC [name], [name], MD   850 mg at 03/07/20 8299  . metoprolol succinate (TOPROL-XL) 24 hr tablet 50 mg  50 mg Oral Daily [name], [name], MD   50 mg  at 03/07/20 0813  . oxybutynin (DITROPAN) tablet 10 mg  10 mg Oral BID [name], [name], MD   10 mg at 03/07/20 3716  . pantoprazole (PROTONIX) EC tablet 40 mg  40 mg Oral Daily [name], [name], MD   40 mg at 03/07/20 9678  . prazosin (MINIPRESS) capsule 1 mg  1 mg Oral QHS [name], [name], MD   1 mg at 03/06/20 2113  . QUEtiapine (SEROQUEL) tablet 100 mg  100 mg Oral QID [name], [name], MD   100 mg at 03/07/20 0811  . QUEtiapine (SEROQUEL) tablet 400 mg  400 mg Oral QHS [name], NP   400 mg at 03/06/20 2113  . sertraline (ZOLOFT) tablet 100 mg  100 mg Oral BID [name], [name], MD   100 mg at 03/07/20 9381  . simethicone (MYLICON) chewable tablet 80 mg  80 mg Oral TID [name], [name], MD   80 mg at 03/07/20 0813  . simvastatin (ZOCOR) tablet 20 mg  20 mg Oral q1800 [name], [name], MD   20 mg at 03/06/20 1735  . tamsulosin (FLOMAX) capsule 0.8 mg  0.8 mg Oral QPC supper [name], [name], MD   0.8 mg at 03/06/20 1734  PTA Medications: Medications Prior to Admission  Medication Sig Dispense Refill Last Dose  . acetaminophen (TYLENOL) 650 MG CR tablet Take 650 mg by mouth every 12 (twelve) hours as needed for pain.     Marland Kitchen aspirin EC 81 MG tablet Take 81 mg by mouth daily.     . Calcium Carbonate-Vitamin D3 (CALCIUM 600-D) 600-400 MG-UNIT TABS Take 1 tablet by mouth 2 (two) times daily.     . Cholecalciferol (VITAMIN D-1000 MAX ST) 1000 UNITS tablet Take 1,000 Units by mouth daily.      Marland Kitchen docusate sodium (COLACE) 100 MG capsule Take 100 mg by mouth daily.      . furosemide (LASIX) 20 MG tablet Take 20 mg by mouth daily.      Marland Kitchen gabapentin (NEURONTIN) 100 MG capsule Take 1 capsule (100 mg total) by mouth 3 (three) times daily. 90 capsule 10   . Insulin Detemir (LEVEMIR FLEXTOUCH) 100 UNIT/ML Pen Inject 15 Units into the skin daily at 10 pm.     . lamoTRIgine (LAMICTAL) 100 MG tablet Take 1 tablet (100 mg total) by mouth at bedtime. 30 tablet 10   . loratadine (CLARITIN) 10 MG tablet Take  1 tablet (10 mg total) by mouth daily. 30 tablet 5   . LORazepam (ATIVAN) 1 MG tablet Take 1 tablet (1 mg total) by mouth 3 (three) times daily. 90 tablet 5   . lurasidone (LATUDA) 40 MG TABS tablet Take 1 tablet (40 mg total) by mouth daily. with food 30 tablet 10   . metoprolol succinate (TOPROL-XL) 50 MG 24 hr tablet Take 50 mg by mouth daily.      . NON FORMULARY cpap device     . oxybutynin (DITROPAN) 5 MG tablet Take 5 mg by mouth daily.     . pantoprazole (PROTONIX) 40 MG tablet Take 40 mg by mouth daily.     . polyethylene glycol (MIRALAX / GLYCOLAX) packet Take 17 g by mouth daily.     . QUEtiapine (SEROQUEL) 100 MG tablet TAKE 1 TABLET  BY MOUTH THREE TIMES A DAY (Patient taking differently: Take 100 mg by mouth 3 (three) times daily. TAKE 1 TABLET  BY MOUTH THREE TIMES A DAY) 90 tablet 11   . QUEtiapine (SEROQUEL) 400 MG tablet Take 1 tablet (400 mg total) by mouth at bedtime. 30 tablet 10   . sertraline (ZOLOFT) 100 MG tablet Take 2 tablets (200 mg total) by mouth daily. (Patient taking differently: Take 200 mg by mouth daily. Take along with two 50 mg tablets (100 mg) for total 300 mg daily) 60 tablet 5   . sertraline (ZOLOFT) 50 MG tablet Take 2 tablets (100 mg total) by mouth daily. (Patient taking differently: Take 100 mg by mouth daily. Take along with two 100 mg tablets (200 mg) for total 300 mg daily) 60 tablet 5   . simethicone (MYLICON) 80 MG chewable tablet Chew 80-160 mg by mouth 2 (two) times daily as needed for flatulence.     . simvastatin (ZOCOR) 20 MG tablet Take 20 mg by mouth at bedtime.      . sitaGLIPtin (JANUVIA) 100 MG tablet Take 100 mg by mouth daily.     . solifenacin (VESICARE) 5 MG tablet Take 5 mg by mouth daily.     . Starch (HEMORRHOIDAL RE) Place 1 application rectally 4 (four) times daily as needed (for itching).     . tamsulosin (FLOMAX) 0.4 MG CAPS capsule Take 0.4 mg by mouth daily.  Patient Stressors: Health problems Marital or family  conflict Medication change or noncompliance Traumatic event  Patient Strengths: Motivation for treatment/growth Religious Affiliation  Treatment Modalities: Medication Management, Group therapy, Case management,  1 to 1 session with clinician, Psychoeducation, Recreational therapy.   Physician Treatment Plan for Primary Diagnosis: Schizoaffective disorder, bipolar type ([place]) Long Term Goal(s): Improvement in symptoms so as ready for discharge Improvement in symptoms so as ready for discharge   Short Term Goals: Ability to verbalize feelings will improve Ability to demonstrate self-control will improve Ability to maintain clinical measurements within normal limits will improve Compliance with prescribed medications will improve  Medication Management: Evaluate patient's response, side effects, and tolerance of medication regimen.  Therapeutic Interventions: 1 to 1 sessions, Unit Group sessions and Medication administration.  Evaluation of Outcomes: Progressing  Physician Treatment Plan for Secondary Diagnosis: Principal Problem:   Schizoaffective disorder, bipolar type ([place]) Active Problems:   Active autistic disorder   Diabetes ([place])   Hypertension   Prostate hypertrophy   Intellectual disability   At risk for elopement  Long Term Goal(s): Improvement in symptoms so as ready for discharge Improvement in symptoms so as ready for discharge   Short Term Goals: Ability to verbalize feelings will improve Ability to demonstrate self-control will improve Ability to maintain clinical measurements within normal limits will improve Compliance with prescribed medications will improve     Medication Management: Evaluate patient's response, side effects, and tolerance of medication regimen.  Therapeutic Interventions: 1 to 1 sessions, Unit Group sessions and Medication administration.  Evaluation of Outcomes: Progressing   RN Treatment Plan for Primary Diagnosis: Schizoaffective  disorder, bipolar type ([place]) Long Term Goal(s): Knowledge of disease and therapeutic regimen to maintain health will improve  Short Term Goals: Ability to demonstrate self-control, Ability to participate in decision making will improve, Ability to verbalize feelings will improve, Ability to disclose and discuss suicidal ideas, Ability to identify and develop effective coping behaviors will improve and Compliance with prescribed medications will improve  Medication Management: RN will administer medications as ordered by provider, will assess and evaluate patient's response and provide education to patient for prescribed medication. RN will report any adverse and/or side effects to prescribing provider.  Therapeutic Interventions: 1 on 1 counseling sessions, Psychoeducation, Medication administration, Evaluate responses to treatment, Monitor vital signs and CBGs as ordered, Perform/monitor CIWA, COWS, AIMS and Fall Risk screenings as ordered, Perform wound care treatments as ordered.  Evaluation of Outcomes: Progressing   LCSW Treatment Plan for Primary Diagnosis: Schizoaffective disorder, bipolar type ([place]) Long Term Goal(s): Safe transition to appropriate next level of care at discharge, Engage patient in therapeutic group addressing interpersonal concerns.  Short Term Goals: Engage patient in aftercare planning with referrals and resources, Increase social support, Increase ability to appropriately verbalize feelings, Increase emotional regulation and Increase skills for wellness and recovery  Therapeutic Interventions: Assess for all discharge needs, 1 to 1 time with Social worker, Explore available resources and support systems, Assess for adequacy in community support network, Educate family and significant other(s) on suicide prevention, Complete Psychosocial Assessment, Interpersonal group therapy.  Evaluation of Outcomes: Progressing   Progress in Treatment: Attending groups:  Yes. Participating in groups: Yes. Taking medication as prescribed: Yes. Toleration medication: Yes. Family/Significant other contact made: Yes, individual(s) contacted:  V[place]ery [place], Brother  Patient understands diagnosis: Yes. Discussing patient identified problems/goals with staff: Yes. Medical problems stabilized or resolved: Yes. Denies suicidal/homicidal ideation: Yes. Issues/concerns per patient self-inventory: No. Other:   New problem(s) identified:  No, Describe:  None  New Short Term/Long Term Goal(s): Elimination of symptoms of psychosis, medication management for mood stabilization; elimination of SI thoughts; development of comprehensive mental wellness/sobriety plan. Update 03/01/20: No changes at this time. 03/07/20: Update, no changes at this time   Patient Goals:  Patient stated he would like to go back to [place] and reconnect with his peer support. Patient also stated that he would like to increase his social support and help his brother. Update 03/07/20, no change  Discharge Plan or Barriers: Patient has an interview with a group home on 03/09/20 at 2:00 PM.   Reason for Continuation of Hospitalization: Anxiety Depression Medication stabilization  Estimated Length of Stay: TBD  Attendees: Patient: 03/07/2020 10:43 AM  Physician: [name], MD 03/07/2020 10:43 AM  Nursing: Vivianne Spence, RN, Graceann Congress, RN 03/07/2020 10:43 AM  RN Care Manager: 03/07/2020 10:43 AM  Social Worker: [name], [place] 03/07/2020 10:43 AM  Recreational Therapist:  03/07/2020 10:43 AM  Other:  03/07/2020 10:43 AM  Other:  03/07/2020 10:43 AM  Other: 03/07/2020 10:43 AM    Scribe for Treatment Team: [name], [place] 03/07/2020 10:43 AM

## 2020-03-07 NOTE — Progress Notes (Signed)
[facility] MD Progress Note  03/07/2020 11:29 AM ELL TISO  MRN:  [id number] Subjective: Follow-up patient with developmental disability intellectual disability probable neurocognitive problem schizoaffective disorder.  No new complaints today.  Still prone to getting anxious easily but responds well to redirection.  No aggression.  No suicidal thought or behavior. Principal Problem: Schizoaffective disorder, bipolar type ([place]) Diagnosis: Principal Problem:   Schizoaffective disorder, bipolar type ([place]) Active Problems:   Active autistic disorder   Diabetes ([place])   Hypertension   Prostate hypertrophy   Intellectual disability   At risk for elopement  Total Time spent with patient: 30 minutes  Past Psychiatric History: Longstanding history of chronic impairment with probably some worsening difficulties related to decline in intellectual functioning in later years  Past Medical History:  Past Medical History:  Diagnosis Date  . Anxiety   . Anxiety disorder due to known physiological condition    HOSPITALIZED 10/18  . Arthritis   . Autistic disorder, residual state   . COPD (chronic obstructive pulmonary disease) ([place])   . Depression   . Developmental disorder   . Diabetes mellitus without complication ([place])   . Dyslipidemia   . Esophageal reflux   . HOH (hard of hearing)    MILDLY  . Hypertension   . Obesity   . Overactive bladder   . Palpitations    ANXIETY  . Schizophrenia, schizoaffective ([place])   . Sleep apnea   . Tremors of nervous system    HANDS DUE TO MEDICATIONS  . Urinary incontinence     Past Surgical History:  Procedure Laterality Date  . CATARACT EXTRACTION W/PHACO Left 11/14/2017   Procedure: CATARACT EXTRACTION PHACO AND INTRAOCULAR LENS PLACEMENT (IOC);  Surgeon: Birder Robson, MD;  Location: [facility] ORS;  Service: Ophthalmology;  Laterality: Left;  [place] 00:42 AP% 14.6 CDE 6.12 Fluid pack lot # [id number]  . COLONOSCOPY  01/28/2005  . COLONOSCOPY WITH  PROPOFOL N/A 05/09/2016   Procedure: COLONOSCOPY WITH PROPOFOL;  Surgeon: Manya Silvas, MD;  [facility] END[facility]COPY;  Service: Endoscopy;  Laterality: N/A;  . ESOPHAGOGASTRODUODENOSCOPY N/A 05/09/2016   Procedure: ESOPHAGOGASTRODUODENOSCOPY (EGD);  Surgeon: Manya Silvas, MD;  Location: [facility] ENDOSCOPY;  Service: Endoscopy;  Laterality: N/A;  . FRACTURE SURGERY     ORIF SHOULDER  . TONSILLECTOMY     Family History:  Family History  Problem Relation Age of Onset  . Hypertension Mother   . Stroke Father   . Heart Problems Father    Family Psychiatric  History: See previous Social History:  Social History   Substance and Sexual Activity  Alcohol Use No  . Alcohol/week: 0.0 standard drinks     Social History   Substance and Sexual Activity  Drug Use No    Social History   Socioeconomic History  . Marital status: Single    Spouse name: Not on file  . Number of children: Not on file  . Years of education: Not on file  . Highest education level: Not on file  Occupational History  . Not on file  Tobacco Use  . Smoking status: Never Smoker  . Smokeless tobacco: Never Used  Vaping Use  . Vaping Use: Never used  Substance and Sexual Activity  . Alcohol use: No    Alcohol/week: 0.0 standard drinks  . Drug use: No  . Sexual activity: Never  Other Topics Concern  . Not on file  Social History Narrative   The patient never finished high school but did get his GED.  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 He is currently in disability and [name] is his legal guardian. He has been living in group homes for many years.      No pending legal charges   Social Determinants of Health   Financial Resource Strain:   . Difficulty of Paying Living Expenses: Not on file  Food Insecurity:   . Worried About Charity fundraiser in the Last Year: Not on file  . Ran Out of Food in the Last Year: Not on file   Transportation Needs:   . Lack of Transportation (Medical): Not on file  . Lack of Transportation (Non-Medical): Not on file  Physical Activity:   . Days of Exercise per Week: Not on file  . Minutes of Exercise per Session: Not on file  Stress:   . Feeling of Stress : Not on file  Social Connections:   . Frequency of Communication with Friends and Family: Not on file  . Frequency of Social Gatherings with Friends and Family: Not on file  . Attends Religious Services: Not on file  . Active Member of Clubs or Organizations: Not on file  . Attends Archivist Meetings: Not on file  . Marital Status: Not on file   Additional Social History:                         Sleep: Fair  Appetite:  Fair  Current Medications: Current Facility-Administered Medications  Medication Dose Route Frequency Provider Last Rate Last Admin  . acetaminophen (TYLENOL) tablet 650 mg  650 mg Oral Q6H PRN [name], MD      . alum & mag hydroxide-simeth (MAALOX/MYLANTA) 200-200-20 MG/5ML suspension 30 mL  30 mL Oral Q4H PRN [name], MD      . aspirin EC tablet 81 mg  81 mg Oral Daily Seniah Lawrence, [name], MD   81 mg at 03/07/20 0810  . clonazePAM (KLONOPIN) tablet 1 mg  1 mg Oral QID Emelynn Rance, [name], MD   1 mg at 03/07/20 0813  . docusate sodium (COLACE) capsule 100 mg  100 mg Oral BID Joeann Steppe, [name], MD   100 mg at 03/07/20 0811  . gabapentin (NEURONTIN) capsule 100 mg  100 mg Oral TID Gwin Eagon T, MD   100 mg at 03/07/20 0813  . hydrOXYzine (ATARAX/VISTARIL) tablet 10 mg  10 mg Oral TID PRN [name], MD   10 mg at 02/20/20 2127  . lamoTRIgine (LAMICTAL) tablet 100 mg  100 mg Oral QHS Lalana Wachter, [name], MD   100 mg at 03/06/20 2114  . linagliptin (TRADJENTA) tablet 5 mg  5 mg Oral Daily Shavonte Zhao, [name], MD   5 mg at 03/07/20 0811  . loratadine (CLARITIN) tablet 10 mg  10 mg Oral Daily Yahayra Geis, [name], MD   10 mg at 03/07/20 0813  . magnesium hydroxide (MILK OF  MAGNESIA) suspension 30 mL  30 mL Oral Daily PRN [name], MD   30 mL at 03/06/20 1228  . metFORMIN (GLUCOPHAGE) tablet 850 mg  850 mg Oral BID WC Kairos Panetta, [name], MD   850 mg at 03/07/20 8185  . metoprolol succinate (TOPROL-XL) 24 hr tablet 50 mg  50 mg Oral Daily Gustavo Meditz, [name], MD   50 mg at 03/07/20 0813  . oxybutynin (DITROPAN) tablet 10 mg  10 mg Oral BID Khush Pasion,  [name], MD   10 mg at 03/07/20 9211  . pantoprazole (PROTONIX) EC tablet 40 mg  40 mg Oral Daily Marlee Trentman, [name], MD   40 mg at 03/07/20 9417  . prazosin (MINIPRESS) capsule 1 mg  1 mg Oral QHS Hailei Besser T, MD   1 mg at 03/06/20 2113  . QUEtiapine (SEROQUEL) tablet 100 mg  100 mg Oral QID Nechama Escutia, [name], MD   100 mg at 03/07/20 0811  . QUEtiapine (SEROQUEL) tablet 400 mg  400 mg Oral QHS [name], NP   400 mg at 03/06/20 2113  . sertraline (ZOLOFT) tablet 100 mg  100 mg Oral BID Ahnika Hannibal, [name], MD   100 mg at 03/07/20 4081  . simethicone (MYLICON) chewable tablet 80 mg  80 mg Oral TID Noriko Macari, [name], MD   80 mg at 03/07/20 0813  . simvastatin (ZOCOR) tablet 20 mg  20 mg Oral q1800 Nelly Scriven, [name], MD   20 mg at 03/06/20 1735  . tamsulosin (FLOMAX) capsule 0.8 mg  0.8 mg Oral QPC supper Tanja Gift T, MD   0.8 mg at 03/06/20 1734    Lab Results: No results found for this or any previous visit (from the past 48 hour(s)).  Blood Alcohol level:  Lab Results  Component Value Date   ETH <10 01/22/2020   ETH <10 44/81/8563    Metabolic Disorder Labs: Lab Results  Component Value Date   HGBA1C 6.6 (H) 02/20/2020   MPG 142.72 02/20/2020   No results found for: PROLACTIN Lab Results  Component Value Date   CHOL 153 04/10/2015   TRIG 166 (H) 04/10/2015   HDL 50 04/10/2015   CHOLHDL 3.1 04/10/2015   VLDL 33 04/10/2015   LDLCALC 70 04/10/2015    Physical Findings: AIMS:  , ,  ,  ,    CIWA:    COWS:     Musculoskeletal: Strength & Muscle Tone: within normal limits Gait & Station:  normal Patient leans: N/A  Psychiatric Specialty Exam: Physical Exam Vitals and nursing note reviewed.  Constitutional:      Appearance: He is well-developed.  HENT:     Head: Normocephalic and atraumatic.  Eyes:     Conjunctiva/sclera: Conjunctivae normal.     Pupils: Pupils are equal, round, and reactive to light.  Cardiovascular:     Heart sounds: Normal heart sounds.  Pulmonary:     Effort: Pulmonary effort is normal.  Abdominal:     Palpations: Abdomen is soft.  Musculoskeletal:        General: Normal range of motion.     Cervical back: Normal range of motion.  Skin:    General: Skin is warm and dry.  Neurological:     General: No focal deficit present.     Mental Status: He is alert.  Psychiatric:        Attention and Perception: He is inattentive.        Mood and Affect: Mood is anxious.        Speech: Speech is tangential.        Behavior: Behavior is not agitated or aggressive.        Thought Content: Thought content does not include homicidal or suicidal ideation.        Cognition and Memory: Cognition is impaired.        Judgment: Judgment is impulsive and inappropriate.     Review of Systems  Constitutional: Negative.   HENT: Negative.   Eyes: Negative.  Respiratory: Negative.   Cardiovascular: Negative.   Gastrointestinal: Negative.   Musculoskeletal: Negative.   Skin: Negative.   Neurological: Negative.   Psychiatric/Behavioral: Negative.     Blood pressure 135/84, pulse 66, temperature 99 F (37.2 C), temperature source Oral, resp. rate 18, height 5\' 8"  (1.727 m), weight 104.3 kg, SpO2 100 %.Body mass index is 34.96 kg/m.  General Appearance: Casual  Eye Contact:  Fair  Speech:  Garbled  Volume:  Normal  Mood:  Euthymic  Affect:  Constricted  Thought Process:  Disorganized  Orientation:  Full (Time, Place, and Person)  Thought Content:  Rumination and Tangential  Suicidal Thoughts:  No  Homicidal Thoughts:  No  Memory:  Immediate;    Fair Recent;   Fair Remote;   Fair  Judgement:  Impaired  Insight:  Shallow  Psychomotor Activity:  Decreased  Concentration:  Concentration: Poor  Recall:  Poor  Fund of Knowledge:  Fair  Language:  Fair  Akathisia:  No  Handed:  Right  AIMS (if indicated):     Assets:  Desire for Improvement Resilience Social Support  ADL's:  Impaired  Cognition:  Impaired,  Mild  Sleep:  Number of Hours: 7     Treatment Plan Summary: Daily contact with patient to assess and evaluate symptoms and progress in treatment, Medication management and Plan Pretty stable.  Doing about as well as can be expected.  Slept well last night.  The most acute thing I was working on with medication was trying to help him with the enuresis which has been a barrier to group home placement although not as much as some of his other behaviors.  Patient is aware he has an interview coming up in the next couple days.  Also as mentioned yesterday I spoke with a coordinator from aging services who is working on trying to find some placement options.  Alethia Berthold, MD 03/07/2020, 11:29 AM

## 2020-03-08 NOTE — Progress Notes (Signed)
[facility] MD Progress Note  03/08/2020 10:04 AM Alexander Beard  MRN:  [id number] Subjective: Follow-up for this 64 year old man with chronic disability.  Carries a diagnosis of schizoaffective disorder.  Patient seen chart reviewed.  Patient has no new complaint today.  We had a very appropriate chapter about how he will need to make some compromises in order to live in a new group home or other facility and how it might be worthwhile for him to start thinking about that or making notes about it.  He shows no sign of psychosis.  He is not agitated his behavior is fine.  Blood pressure normal.  No new physical complaints. Principal Problem: Schizoaffective disorder, bipolar type ([place]) Diagnosis: Principal Problem:   Schizoaffective disorder, bipolar type ([place]) Active Problems:   Active autistic disorder   Diabetes ([place])   Hypertension   Prostate hypertrophy   Intellectual disability   At risk for elopement  Total Time spent with patient: 30 minutes  Past Psychiatric History: Past history of longstanding disability  Past Medical History:  Past Medical History:  Diagnosis Date  . Anxiety   . Anxiety disorder due to known physiological condition    HOSPITALIZED 10/18  . Arthritis   . Autistic disorder, residual state   . COPD (chronic obstructive pulmonary disease) ([place])   . Depression   . Developmental disorder   . Diabetes mellitus without complication ([name])   . Dyslipidemia   . Esophageal reflux   . HOH (hard of hearing)    MILDLY  . Hypertension   . Obesity   . Overactive bladder   . Palpitations    ANXIETY  . Schizophrenia, schizoaffective ([place])   . Sleep apnea   . Tremors of nervous system    HANDS DUE TO MEDICATIONS  . Urinary incontinence     Past Surgical History:  Procedure Laterality Date  . CATARACT EXTRACTION W/PHACO Left 11/14/2017   Procedure: CATARACT EXTRACTION PHACO AND INTRAOCULAR LENS PLACEMENT (IOC);  Surgeon: Birder Robson, MD;  Location: [facility] ORS;   Service: Ophthalmology;  Laterality: Left;  [place] 00:42 AP% 14.6 CDE 6.12 Fluid pack lot # [id number]  . COLONOSCOPY  01/28/2005  . COLONOSCOPY WITH PROPOFOL N/A 05/09/2016   Procedure: COLONOSCOPY WITH PROPOFOL;  Surgeon: Manya Silvas, MD;  Location: [facility] ENDOSCOPY;  Service: Endoscopy;  Laterality: N/A;  . ESOPHAGOGASTRODUODENOSCOPY N/A 05/09/2016   Procedure: ESOPHAGOGASTRODUODENOSCOPY (EGD);  Surgeon: Manya Silvas, MD;  Location: [facility] ENDOSCOPY;  Service: Endoscopy;  Laterality: N/A;  . FRACTURE SURGERY     ORIF SHOULDER  . TONSILLECTOMY     Family History:  Family History  Problem Relation Age of Onset  . Hypertension Mother   . Stroke Father   . Heart Problems Father    Family Psychiatric  History: See previous Social History:  Social History   Substance and Sexual Activity  Alcohol Use No  . Alcohol/week: 0.0 standard drinks     Social History   Substance and Sexual Activity  Drug Use No    Social History   Socioeconomic History  . Marital status: Single    Spouse name: Not on file  . Number of children: Not on file  . Years of education: Not on file  . Highest education level: Not on file  Occupational History  . Not on file  Tobacco Use  . Smoking status: Never Smoker  . Smokeless tobacco: Never Used  Vaping Use  . Vaping Use: Never used  Substance and Sexual Activity  .  Alcohol use: No    Alcohol/week: 0.0 standard drinks  . Drug use: No  . Sexual activity: Never  Other Topics Concern  . Not on file  Social History Narrative   The patient never finished high school but did get his GED. 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 He is currently in disability and [name] is his legal guardian. He has been living in group homes for many years.      No pending legal charges   Social Determinants of Health   Financial Resource Strain:   . Difficulty of Paying Living Expenses:  Not on file  Food Insecurity:   . Worried About Charity fundraiser in the Last Year: Not on file  . Ran Out of Food in the Last Year: Not on file  Transportation Needs:   . Lack of Transportation (Medical): Not on file  . Lack of Transportation (Non-Medical): Not on file  Physical Activity:   . Days of Exercise per Week: Not on file  . Minutes of Exercise per Session: Not on file  Stress:   . Feeling of Stress : Not on file  Social Connections:   . Frequency of Communication with Friends and Family: Not on file  . Frequency of Social Gatherings with Friends and Family: Not on file  . Attends Religious Services: Not on file  . Active Member of Clubs or Organizations: Not on file  . Attends Archivist Meetings: Not on file  . Marital Status: Not on file   Additional Social History:                         Sleep: Fair  Appetite:  Fair  Current Medications: Current Facility-Administered Medications  Medication Dose Route Frequency Provider Last Rate Last Admin  . acetaminophen (TYLENOL) tablet 650 mg  650 mg Oral Q6H PRN [name], MD      . alum & mag hydroxide-simeth (MAALOX/MYLANTA) 200-200-20 MG/5ML suspension 30 mL  30 mL Oral Q4H PRN [name], MD      . aspirin EC tablet 81 mg  81 mg Oral Daily Gracy Ehly, [name], MD   81 mg at 03/08/20 3149  . clonazePAM (KLONOPIN) tablet 1 mg  1 mg Oral QID Nello Corro, [name], MD   1 mg at 03/08/20 7026  . docusate sodium (COLACE) capsule 100 mg  100 mg Oral BID Brienne Liguori, [name], MD   100 mg at 03/08/20 0813  . gabapentin (NEURONTIN) capsule 100 mg  100 mg Oral TID Marchell Froman T, MD   100 mg at 03/08/20 0813  . hydrOXYzine (ATARAX/VISTARIL) tablet 10 mg  10 mg Oral TID PRN [name], MD   10 mg at 02/20/20 2127  . lamoTRIgine (LAMICTAL) tablet 100 mg  100 mg Oral QHS Malisa Ruggiero T, MD   100 mg at 03/07/20 2134  . linagliptin (TRADJENTA) tablet 5 mg  5 mg Oral Daily Xaniyah Buchholz, [name], MD   5 mg at  03/08/20 3785  . loratadine (CLARITIN) tablet 10 mg  10 mg Oral Daily Jasmia Angst, [name], MD   10 mg at 03/08/20 8850  . magnesium hydroxide (MILK OF MAGNESIA) suspension 30 mL  30 mL Oral Daily PRN [name], MD   30 mL at 03/06/20 1228  . metFORMIN (GLUCOPHAGE) tablet 850 mg  850 mg Oral BID WC Caley Volkert, [name], MD  850 mg at 03/08/20 0811  . metoprolol succinate (TOPROL-XL) 24 hr tablet 50 mg  50 mg Oral Daily Narissa Beaufort, [name], MD   50 mg at 03/08/20 1517  . oxybutynin (DITROPAN) tablet 10 mg  10 mg Oral BID Malini Flemings, [name], MD   10 mg at 03/08/20 6160  . pantoprazole (PROTONIX) EC tablet 40 mg  40 mg Oral Daily Bannon Giammarco, [name], MD   40 mg at 03/08/20 7371  . prazosin (MINIPRESS) capsule 1 mg  1 mg Oral QHS Desha Bitner T, MD   1 mg at 03/07/20 2204  . QUEtiapine (SEROQUEL) tablet 100 mg  100 mg Oral QID Naylin Burkle, [name], MD   100 mg at 03/08/20 0626  . QUEtiapine (SEROQUEL) tablet 400 mg  400 mg Oral QHS [name], NP   400 mg at 03/07/20 2133  . sertraline (ZOLOFT) tablet 100 mg  100 mg Oral BID Shamicka Inga, [name], MD   100 mg at 03/08/20 9485  . simethicone (MYLICON) chewable tablet 80 mg  80 mg Oral TID Neftali Thurow, [name], MD   80 mg at 03/08/20 4627  . simvastatin (ZOCOR) tablet 20 mg  20 mg Oral q1800 Chidubem Chaires, [name], MD   20 mg at 03/07/20 1736  . tamsulosin (FLOMAX) capsule 0.8 mg  0.8 mg Oral QPC supper Antonetta Clanton T, MD   0.8 mg at 03/07/20 1729    Lab Results: No results found for this or any previous visit (from the past 48 hour(s)).  Blood Alcohol level:  Lab Results  Component Value Date   ETH <10 01/22/2020   ETH <10 03/50/0938    Metabolic Disorder Labs: Lab Results  Component Value Date   HGBA1C 6.6 (H) 02/20/2020   MPG 142.72 02/20/2020   No results found for: PROLACTIN Lab Results  Component Value Date   CHOL 153 04/10/2015   TRIG 166 (H) 04/10/2015   HDL 50 04/10/2015   CHOLHDL 3.1 04/10/2015   VLDL 33 04/10/2015   LDLCALC 70 04/10/2015     Physical Findings: AIMS:  , ,  ,  ,    CIWA:    COWS:     Musculoskeletal: Strength & Muscle Tone: within normal limits Gait & Station: normal Patient leans: N/A  Psychiatric Specialty Exam: Physical Exam Vitals and nursing note reviewed.  Constitutional:      Appearance: He is well-developed.  HENT:     Head: Normocephalic and atraumatic.  Eyes:     Conjunctiva/sclera: Conjunctivae normal.     Pupils: Pupils are equal, round, and reactive to light.  Cardiovascular:     Heart sounds: Normal heart sounds.  Pulmonary:     Effort: Pulmonary effort is normal.  Abdominal:     Palpations: Abdomen is soft.  Musculoskeletal:        General: Normal range of motion.     Cervical back: Normal range of motion.  Skin:    General: Skin is warm and dry.  Neurological:     General: No focal deficit present.     Mental Status: He is alert.  Psychiatric:        Mood and Affect: Mood normal.        Behavior: Behavior normal.     Review of Systems  Constitutional: Negative.   HENT: Negative.   Eyes: Negative.   Respiratory: Negative.   Cardiovascular: Negative.   Gastrointestinal: Negative.   Musculoskeletal: Negative.   Skin: Negative.   Neurological: Negative.   Psychiatric/Behavioral: Negative.  Blood pressure 127/72, pulse 65, temperature 98.4 F (36.9 C), temperature source Oral, resp. rate 18, height 5\' 8"  (1.727 m), weight 104.3 kg, SpO2 97 %.Body mass index is 34.96 kg/m.  General Appearance: Casual  Eye Contact:  Good  Speech:  Clear and Coherent  Volume:  Normal  Mood:  Euthymic  Affect:  Congruent  Thought Process:  Goal Directed  Orientation:  Full (Time, Place, and Person)  Thought Content:  Logical  Suicidal Thoughts:  No  Homicidal Thoughts:  No  Memory:  Immediate;   Fair Recent;   Fair Remote;   Fair  Judgement:  Fair  Insight:  Fair  Psychomotor Activity:  Normal  Concentration:  Concentration: Fair  Recall:  [employer] of Knowledge:   Fair  Language:  Fair  Akathisia:  No  Handed:  Right  AIMS (if indicated):     Assets:  Desire for Improvement Resilience Social Support  ADL's:  Intact  Cognition:  Impaired,  Mild  Sleep:  Number of Hours: 7     Treatment Plan Summary: Daily contact with patient to assess and evaluate symptoms and progress in treatment, Medication management and Plan No change to psychiatric medicine.  No change to other medication at this point either.  Supportive therapy cognitive therapy support and discussion around discharge plans done with the patient.  Patient reminded that I will be away from the hospital for several days but that another doctor will be covering.  Alethia Berthold, MD 03/08/2020, 10:04 AM

## 2020-03-08 NOTE — Plan of Care (Signed)
  Problem: Education: Goal: Knowledge of [facility] Education information/materials will improve Outcome: Progressing Goal: Emotional status will improve Outcome: Progressing Goal: Mental status will improve Outcome: Progressing Goal: Verbalization of understanding the information provided will improve Outcome: Progressing   Problem: Activity: Goal: Interest or engagement in activities will improve Outcome: Progressing Goal: Sleeping patterns will improve Outcome: Progressing   

## 2020-03-08 NOTE — [facility] Group Notes (Signed)
Date/Time: 03/08/2020 1:11-2:05 PM   Type of Therapy and Topic: Group Therapy: Trust and Honesty    Active    Description of Group:  In this group patients will be asked to explore value of being honest. Patients will be guided to discuss their thoughts, feelings, and behaviors related to honesty and trusting in others. Patients will process together how trust and honesty relate to how we form relationships with peers, family members, and self. Each patient will be challenged to identify and express feelings of being vulnerable. Patients will discuss reasons why people are dishonest and identify alternative outcomes if one was truthful (to self or others). This group will be process-oriented, with patients participating in exploration of their own experiences as well as giving and receiving support and challenge from other group members.      Therapeutic Goals:  1. Patient will identify why honesty is important to relationships and how honesty overall affects relationships.  2. Patient will identify a situation where they lied or were lied too and the feelings, thought process, and behaviors surrounding the situation  3. Patient will identify the meaning of being vulnerable, how that feels, and how that correlates to being honest with self and others.  4. Patient will identify situations where they could have told the truth, but instead lied and explain reasons of dishonesty.      Summary of Patient Progress: Patient came into group late but was active throughout group. Patient spoke about how trust and honesty are important in relationships. Patient spoke about prior relationships where he couldn't trust his significant others. Patient stated everyone is entitled when it comes to telling the truth.     Therapeutic Modalities:  Cognitive Behavioral Therapy  Solution Focused Therapy  Motivational Interviewing  Brief Therapy     [name], Latanya Presser   03/08/2020

## 2020-03-08 NOTE — Progress Notes (Signed)
Patient has been calm and cooperative. Pleasant. Spending time on his artwork out in the dayroom. Asking for permission to post some of his drawings in the nurses station and in the craft room. Denies SI, HI and AVH.

## 2020-03-08 NOTE — Plan of Care (Addendum)
D: Patient is alert and oriented. Patient rates depression 0/10, hopelessness 0/10, and anxiety 0/10. Patient's stated goal is to stay busy to help self control/cooperation. My artwork helping other with and writing becomes more illegible.  Patient reports energy level as normal and concentration as good. Patient reports slept good last night. Patient did not receive medication for sleep. Denies physical pain. Denies SI,HI, or AVH at this time. Verbalizes he is a little concerned about going to new group home and having to adjust to different rules.   A: Scheduled medications administered to patient per MD orders. Reassurance, support and encouragement provided. Verbally contracts for safety. Routine unit safety checks conducted Q 15 minutes. Encouraged to talk with staff at new group home upon arrival about his concerns and hopeful together they can come to mutual understanding of his wishes and their rules. He was very appreciative of that recommendation.   R: Patient adhered to medication administration. No adverse drug reactions noted. Interacts well with others in milieu. Remains safe at this time, will continue to monitor.   Problem: Education: Goal: Emotional status will improve Outcome: Progressing Goal: Verbalization of understanding the information provided will improve Outcome: Progressing   Problem: Activity: Goal: Interest or engagement in activities will improve Outcome: Progressing

## 2020-03-09 NOTE — Plan of Care (Signed)
  Problem: Education: Goal: Knowledge of [facility] Education information/materials will improve Outcome: Progressing Goal: Emotional status will improve Outcome: Progressing Goal: Mental status will improve Outcome: Progressing Goal: Verbalization of understanding the information provided will improve Outcome: Progressing   Problem: Activity: Goal: Interest or engagement in activities will improve Outcome: Progressing Goal: Sleeping patterns will improve Outcome: Progressing   

## 2020-03-09 NOTE — Progress Notes (Signed)
Patient has been calm and cooperative. Placidly working on his artwork. Denies SI, HI and AVH. Is focused on wanting to get permission to put up some of his artwork for display around the unit.

## 2020-03-09 NOTE — Progress Notes (Signed)
[facility] MD Progress Note  03/09/2020 11:21 AM Alexander Beard  MRN:  [id number] Subjective: I am doing okay, I do not have a place to stay Patient is a 64 year old male diagnosed with schizoaffective disorder, who needs a group home placement.  Patient continues to be stable, has no behavioral issues, is able to follow directions, participate in the therapeutic milieu.  Patient is not psychotic, denies being depressed, denies any symptoms of anxiety, any thoughts of self-harm or harm to others.  Patient also denies any side effects with this medication.  Patient does have an interview at 2 PM today  Principal Problem: Schizoaffective disorder, bipolar type ([place]) Diagnosis: Principal Problem:   Schizoaffective disorder, bipolar type ([place]) Active Problems:   Active autistic disorder   Diabetes ([place])   Hypertension   Prostate hypertrophy   Intellectual disability   At risk for elopement  Total Time spent with patient: 15 minutes  Past Psychiatric History: Unchanged  Past Medical History:  Past Medical History:  Diagnosis Date  . Anxiety   . Anxiety disorder due to known physiological condition    HOSPITALIZED 10/18  . Arthritis   . Autistic disorder, residual state   . COPD (chronic obstructive pulmonary disease) ([place])   . Depression   . Developmental disorder   . Diabetes mellitus without complication ([place])   . Dyslipidemia   . Esophageal reflux   . HOH (hard of hearing)    MILDLY  . Hypertension   . Obesity   . Overactive bladder   . Palpitations    ANXIETY  . Schizophrenia, schizoaffective ([place])   . Sleep apnea   . Tremors of nervous system    HANDS DUE TO MEDICATIONS  . Urinary incontinence     Past Surgical History:  Procedure Laterality Date  . CATARACT EXTRACTION W/PHACO Left 11/14/2017   Procedure: CATARACT EXTRACTION PHACO AND INTRAOCULAR LENS PLACEMENT (IOC);  Surgeon: Birder Robson, MD;  Location: [facility] ORS;  Service: Ophthalmology;  Laterality: Left;  [place]  00:42 AP% 14.6 CDE 6.12 Fluid pack lot # [id number]  . COLONOSCOPY  01/28/2005  . COLONOSCOPY WITH PROPOFOL N/A 05/09/2016   Procedure: COLONOSCOPY WITH PROPOFOL;  Surgeon: Manya Silvas, MD;  Location: [facility] ENDOSCOPY;  Service: Endoscopy;  Laterality: N/A;  . ESOPHAGOGASTRODUODENOSCOPY N/A 05/09/2016   Procedure: ESOPHAGOGASTRODUODENOSCOPY (EGD);  Surgeon: Manya Silvas, MD;  Location: [facility] ENDOSCOPY;  Service: Endoscopy;  Laterality: N/A;  . FRACTURE SURGERY     ORIF SHOULDER  . TONSILLECTOMY     Family History:  Family History  Problem Relation Age of Onset  . Hypertension Mother   . Stroke Father   . Heart Problems Father    Family Psychiatric  History: Unchanged Social History:  Social History   Substance and Sexual Activity  Alcohol Use No  . Alcohol/week: 0.0 standard drinks     Social History   Substance and Sexual Activity  Drug Use No    Social History   Socioeconomic History  . Marital status: Single    Spouse name: Not on file  . Number of children: Not on file  . Years of education: Not on file  . Highest education level: Not on file  Occupational History  . Not on file  Tobacco Use  . Smoking status: Never Smoker  . Smokeless tobacco: Never Used  Vaping Use  . Vaping Use: Never used  Substance and Sexual Activity  . Alcohol use: No    Alcohol/week: 0.0 standard drinks  . Drug use:  No  . Sexual activity: Never  Other Topics Concern  . Not on file  Social History Narrative   The patient never finished high school but did get his GED. 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 He is currently in disability and [name] is his legal guardian. He has been living in group homes for many years.      No pending legal charges   Social Determinants of Health   Financial Resource Strain:   . Difficulty of Paying Living Expenses: Not on file  Food Insecurity:   . Worried About  Charity fundraiser in the Last Year: Not on file  . Ran Out of Food in the Last Year: Not on file  Transportation Needs:   . Lack of Transportation (Medical): Not on file  . Lack of Transportation (Non-Medical): Not on file  Physical Activity:   . Days of Exercise per Week: Not on file  . Minutes of Exercise per Session: Not on file  Stress:   . Feeling of Stress : Not on file  Social Connections:   . Frequency of Communication with Friends and Family: Not on file  . Frequency of Social Gatherings with Friends and Family: Not on file  . Attends Religious Services: Not on file  . Active Member of Clubs or Organizations: Not on file  . Attends Archivist Meetings: Not on file  . Marital Status: Not on file   Additional Social History:                         Sleep: Fair  Appetite:  Fair  Current Medications: Current Facility-Administered Medications  Medication Dose Route Frequency Provider Last Rate Last Admin  . acetaminophen (TYLENOL) tablet 650 mg  650 mg Oral Q6H PRN [name], MD      . alum & mag hydroxide-simeth (MAALOX/MYLANTA) 200-200-20 MG/5ML suspension 30 mL  30 mL Oral Q4H PRN [name], MD      . aspirin EC tablet 81 mg  81 mg Oral Daily [name], [name], MD   81 mg at 03/09/20 7591  . clonazePAM (KLONOPIN) tablet 1 mg  1 mg Oral QID [name], [name], MD   1 mg at 03/09/20 6384  . docusate sodium (COLACE) capsule 100 mg  100 mg Oral BID [name], [name], MD   100 mg at 03/09/20 6659  . gabapentin (NEURONTIN) capsule 100 mg  100 mg Oral TID [name], [name], MD   100 mg at 03/09/20 9357  . hydrOXYzine (ATARAX/VISTARIL) tablet 10 mg  10 mg Oral TID PRN [name], MD   10 mg at 02/20/20 2127  . lamoTRIgine (LAMICTAL) tablet 100 mg  100 mg Oral QHS [name], [name], MD   100 mg at 03/08/20 2120  . linagliptin (TRADJENTA) tablet 5 mg  5 mg Oral Daily [name], [name], MD   5 mg at 03/09/20 0177  . loratadine (CLARITIN) tablet 10 mg  10  mg Oral Daily [name], [name], MD   10 mg at 03/09/20 9390  . magnesium hydroxide (MILK OF MAGNESIA) suspension 30 mL  30 mL Oral Daily PRN [name], MD   30 mL at 03/06/20 1228  . metFORMIN (GLUCOPHAGE) tablet 850 mg  850 mg Oral BID WC [name], [name], MD   850 mg at 03/09/20 3009  . metoprolol succinate (TOPROL-XL) 24 hr  tablet 50 mg  50 mg Oral Daily [name], [name], MD   50 mg at 03/09/20 8338  . oxybutynin (DITROPAN) tablet 10 mg  10 mg Oral BID [name], [name], MD   10 mg at 03/09/20 2505  . pantoprazole (PROTONIX) EC tablet 40 mg  40 mg Oral Daily [name], [name], MD   40 mg at 03/09/20 3976  . prazosin (MINIPRESS) capsule 1 mg  1 mg Oral QHS [name], [name], MD   1 mg at 03/07/20 2204  . QUEtiapine (SEROQUEL) tablet 100 mg  100 mg Oral QID [name], [name], MD   100 mg at 03/09/20 7341  . QUEtiapine (SEROQUEL) tablet 400 mg  400 mg Oral QHS [name], NP   400 mg at 03/08/20 2121  . sertraline (ZOLOFT) tablet 100 mg  100 mg Oral BID [name], [name], MD   100 mg at 03/09/20 9379  . simethicone (MYLICON) chewable tablet 80 mg  80 mg Oral TID [name], [name], MD   80 mg at 03/09/20 0240  . simvastatin (ZOCOR) tablet 20 mg  20 mg Oral q1800 [name], [name], MD   20 mg at 03/08/20 1800  . tamsulosin (FLOMAX) capsule 0.8 mg  0.8 mg Oral QPC supper [name], [name], MD   0.8 mg at 03/08/20 1801    Lab Results: No results found for this or any previous visit (from the past 48 hour(s)).  Blood Alcohol level:  Lab Results  Component Value Date   ETH <10 01/22/2020   ETH <10 97/35/3299    Metabolic Disorder Labs: Lab Results  Component Value Date   HGBA1C 6.6 (H) 02/20/2020   MPG 142.72 02/20/2020   No results found for: PROLACTIN Lab Results  Component Value Date   CHOL 153 04/10/2015   TRIG 166 (H) 04/10/2015   HDL 50 04/10/2015   CHOLHDL 3.1 04/10/2015   VLDL 33 04/10/2015   LDLCALC 70 04/10/2015    Physical Findings: AIMS:  , ,  ,  ,    CIWA:    COWS:      Musculoskeletal: Strength & Muscle Tone: within normal limits Gait & Station: normal Patient leans: N/A  Psychiatric Specialty Exam: Physical Exam  Review of Systems  Constitutional: Negative.   HENT: Negative.   Respiratory: Negative.  Negative for shortness of breath and wheezing.   Cardiovascular: Negative.   Neurological: Negative.   Psychiatric/Behavioral: Negative.  Negative for agitation, behavioral problems, confusion, decreased concentration, dysphoric mood, hallucinations, self-injury, sleep disturbance and suicidal ideas. The patient is not nervous/anxious and is not hyperactive.     Blood pressure 122/79, pulse 68, temperature 98 F (36.7 C), temperature source Oral, resp. rate 18, height 5\' 8"  (1.727 m), weight 104.3 kg, SpO2 100 %.Body mass index is 34.96 kg/m.  General Appearance: Disheveled  Eye Contact:  Good  Speech:  Clear and Coherent and Normal Rate  Volume:  Normal  Mood:  Euthymic  Affect:  Appropriate and Blunt  Thought Process:  Coherent, Goal Directed and Descriptions of Associations: Intact  Orientation:  Full (Time, Place, and Person)  Thought Content:  Logical  Suicidal Thoughts:  No  Homicidal Thoughts:  No  Memory:  Immediate;   Fair Recent;   Fair Remote;   Fair  Judgement:  Impaired  Insight:  Shallow  Psychomotor Activity:  Normal  Concentration:  Concentration: Fair and Attention Span: Fair  Recall:  [employer] of Knowledge:  Fair  Language:  Fair  Akathisia:  No  Handed:  Right  AIMS (if indicated):     Assets:  Communication Skills Desire for Improvement  ADL's:  Intact  Cognition:  WNL  Sleep:  Number of Hours: 7     Treatment Plan Summary: Daily contact with patient to assess and evaluate symptoms and progress in treatment and Medication management  Patient continues to be stable, no changes in medications made.  Continue to provide supportive therapy, discussed with patient that he has a 2:00 appointment for an interview.   Social work to continue to work on placement for discharge disposition  Hampton Abbot, MD 03/09/2020, 11:21 AM

## 2020-03-09 NOTE — Progress Notes (Signed)
D: Pt alert and oriented. Pt denies experiencing any anxiety/depression at this time. Pt denies experiencing any pain at this time. Pt denies experiencing any SI/HI, or AVH at this time.   A: Scheduled medications administered to pt, per MD orders. Support and encouragement provided. Frequent verbal contact made. Routine safety checks conducted q15 minutes.   R: No adverse drug reactions noted. Pt verbally contracts for safety at this time. Pt complaint with medications and treatment plan. Pt interacts well with others on the unit. Pt remains safe at this time. Will continue to monitor.  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 Pt realizes he spends too much time occupied with his artwork and that he needs to set time limits however struggles with it. Pt also voices that he likes to be independent and doesn't want help even when struggling.

## 2020-03-10 NOTE — Progress Notes (Signed)
Patient is pleasant and easy to engage.He denies SI/HI/AVH depression and pain on this encounter. He does endorse some anxiety, but is managing symptoms.  He continues to entertain himself and keep busy with his drawings and watching TV in the day room. He is med compliant and tolerated meds without incident. He is safe in the unit with 15 minute safety checks and was informed to contact staff with any concerns.    Cleo Butler-Nicholson, LPN

## 2020-03-10 NOTE — [facility] Counselor (Signed)
CSW has attempted several times to contact Alexander Beard who is helping with placement to no avail. CSW will continue to attempt.  Assunta Curtis, MSW, LCSW 03/10/2020 4:19 PM

## 2020-03-10 NOTE — Plan of Care (Signed)
Pt denies depression, anxiety, SI, HI and AVH. Pt was educated on care plan and verbalizes understanding. Pt was encouraged to attend groups. Collier Bullock RN Problem: Education: Goal: Knowledge of [place] [facility] Education information/materials will improve Outcome: Progressing Goal: Emotional status will improve Outcome: Progressing Goal: Mental status will improve Outcome: Progressing Goal: Verbalization of understanding the information provided will improve Outcome: Progressing   Problem: Activity: Goal: Interest or engagement in activities will improve Outcome: Progressing Goal: Sleeping patterns will improve Outcome: Progressing   Problem: Coping: Goal: Ability to verbalize frustrations and anger appropriately will improve Outcome: Progressing Goal: Ability to demonstrate self-control will improve Outcome: Progressing   Problem: Health Behavior/Discharge Planning: Goal: Identification of resources available to assist in meeting health care needs will improve Outcome: Progressing Goal: Compliance with treatment plan for underlying cause of condition will improve Outcome: Progressing   Problem: Physical Regulation: Goal: Ability to maintain clinical measurements within normal limits will improve Outcome: Progressing   Problem: Safety: Goal: Periods of time without injury will increase Outcome: Progressing   Problem: Education: Goal: Ability to make informed decisions regarding treatment will improve Outcome: Progressing   Problem: Coping: Goal: Coping ability will improve Outcome: Progressing   Problem: Health Behavior/Discharge Planning: Goal: Identification of resources available to assist in meeting health care needs will improve Outcome: Progressing   Problem: Medication: Goal: Compliance with prescribed medication regimen will improve Outcome: Progressing   Problem: Self-Concept: Goal: Ability to disclose and discuss suicidal ideas will  improve Outcome: Progressing Goal: Will verbalize positive feelings about self Outcome: Progressing

## 2020-03-10 NOTE — Progress Notes (Signed)
D- Patient alert and oriented. Affect/mood is calm and cooperative.Pt denies SI, HI, AVH, and pain. Pt attended groups and did his art work today. Collier Bullock RN  A- Scheduled medications administered to patient, per MD orders. Support and encouragement provided.  Routine safety checks conducted every 15 minutes.  Patient informed to notify staff with problems or concerns.  R- No adverse drug reactions noted. Patient contracts for safety at this time. Patient compliant with medications and treatment plan. Patient receptive, calm, and cooperative. Patient interacts well with others on the unit.  Patient remains safe at this time.  Collier Bullock RN

## 2020-03-10 NOTE — Progress Notes (Signed)
Patient still perseverating about getting his drawings posted in the nurses station and in the craft room. Appears very anxious about the process of getting permission to post his drawings. States that they are not quite finished, that he is not happy with the way one turned out. Assisted patient in doing laundry and offered extensive education on how to operate the washer and dryer. Pleasant and cooperative. Denies SI, HI and AVH

## 2020-03-10 NOTE — Plan of Care (Signed)
  Problem: Education: Goal: Knowledge of [place] [facility] Education information/materials will improve Outcome: Progressing Goal: Emotional status will improve Outcome: Progressing Goal: Mental status will improve Outcome: Progressing Goal: Verbalization of understanding the information provided will improve Outcome: Progressing   Problem: Activity: Goal: Interest or engagement in activities will improve Outcome: Progressing Goal: Sleeping patterns will improve Outcome: Progressing   Problem: Coping: Goal: Ability to verbalize frustrations and anger appropriately will improve Outcome: Progressing Goal: Ability to demonstrate self-control will improve Outcome: Progressing   Problem: Health Behavior/Discharge Planning: Goal: Identification of resources available to assist in meeting health care needs will improve Outcome: Progressing Goal: Compliance with treatment plan for underlying cause of condition will improve Outcome: Progressing   Problem: Physical Regulation: Goal: Ability to maintain clinical measurements within normal limits will improve Outcome: Progressing   Problem: Safety: Goal: Periods of time without injury will increase Outcome: Progressing   Problem: Education: Goal: Ability to make informed decisions regarding treatment will improve Outcome: Progressing   Problem: Coping: Goal: Coping ability will improve Outcome: Progressing   Problem: Health Behavior/Discharge Planning: Goal: Identification of resources available to assist in meeting health care needs will improve Outcome: Progressing   Problem: Medication: Goal: Compliance with prescribed medication regimen will improve Outcome: Progressing   Problem: Self-Concept: Goal: Ability to disclose and discuss suicidal ideas will improve Outcome: Progressing Goal: Will verbalize positive feelings about self Outcome: Progressing   

## 2020-03-10 NOTE — Progress Notes (Signed)
[facility] MD Progress Note  03/10/2020 2:58 PM Alexander Beard  MRN:  [id number] Subjective: I am doing okay, nobody called me yesterday  Patient is a 65 year old male diagnosed with schizoaffective disorder, autism who is currently on the unit as he needs a group home placement.  Also discussed with patient and assisted living, patient does have history of living facilities, has a brother who is involved in his care but is unable to look after him due to his own health issues.  Patient reports that he is eating fine, sleeping well, patient denies any depressive symptoms any symptoms of psychosis, any thoughts of self-harm or harm to others. Principal Problem: Schizoaffective disorder, bipolar type ([place]) Diagnosis: Principal Problem:   Schizoaffective disorder, bipolar type ([place]) Active Problems:   Active autistic disorder   Diabetes ([place])   Hypertension   Prostate hypertrophy   Intellectual disability   At risk for elopement  Total Time spent with patient: 15 minutes  Past Psychiatric History: Unchanged  Past Medical History:  Past Medical History:  Diagnosis Date  . Anxiety   . Anxiety disorder due to known physiological condition    HOSPITALIZED 10/18  . Arthritis   . Autistic disorder, residual state   . COPD (chronic obstructive pulmonary disease) ([place])   . Depression   . Developmental disorder   . Diabetes mellitus without complication ([place])   . Dyslipidemia   . Esophageal reflux   . HOH (hard of hearing)    MILDLY  . Hypertension   . Obesity   . Overactive bladder   . Palpitations    ANXIETY  . Schizophrenia, schizoaffective ([place])   . Sleep apnea   . Tremors of nervous system    HANDS DUE TO MEDICATIONS  . Urinary incontinence     Past Surgical History:  Procedure Laterality Date  . CATARACT EXTRACTION W/PHACO Left 11/14/2017   Procedure: CATARACT EXTRACTION PHACO AND INTRAOCULAR LENS PLACEMENT (IOC);  Surgeon: Birder Robson, MD;  Location: [facility] ORS;  Service:  Ophthalmology;  Laterality: Left;  [place] 00:42 AP% 14.6 CDE 6.12 Fluid pack lot # [id number]  . COLONOSCOPY  01/28/2005  . COLONOSCOPY WITH PROPOFOL N/A 05/09/2016   Procedure: COLONOSCOPY WITH PROPOFOL;  Surgeon: Manya Silvas, MD;  Location: [facility] ENDOSCOPY;  Service: Endoscopy;  Laterality: N/A;  . ESOPHAGOGASTRODUODENOSCOPY N/A 05/09/2016   Procedure: ESOPHAGOGASTRODUODENOSCOPY (EGD);  Surgeon: Manya Silvas, MD;  Location: [facility] ENDOSCOPY;  Service: Endoscopy;  Laterality: N/A;  . FRACTURE SURGERY     ORIF SHOULDER  . TONSILLECTOMY     Family History:  Family History  Problem Relation Age of Onset  . Hypertension Mother   . Stroke Father   . Heart Problems Father    Family Psychiatric  History: Unchanged Social History:  Social History   Substance and Sexual Activity  Alcohol Use No  . Alcohol/week: 0.0 standard drinks     Social History   Substance and Sexual Activity  Drug Use No    Social History   Socioeconomic History  . Marital status: Single    Spouse name: Not on file  . Number of children: Not on file  . Years of education: Not on file  . Highest education level: Not on file  Occupational History  . Not on file  Tobacco Use  . Smoking status: Never Smoker  . Smokeless tobacco: Never Used  Vaping Use  . Vaping Use: Never used  Substance and Sexual Activity  . Alcohol use: No    Alcohol/week:  0.0 standard drinks  . Drug use: No  . Sexual activity: Never  Other Topics Concern  . Not on file  Social History Narrative   The patient never finished high school but did get his GED. 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 He is currently in disability and [name] is his legal guardian. He has been living in group homes for many years.      No pending legal charges   Social Determinants of Health   Financial Resource Strain:   . Difficulty of Paying Living Expenses: Not on file   Food Insecurity:   . Worried About Charity fundraiser in the Last Year: Not on file  . Ran Out of Food in the Last Year: Not on file  Transportation Needs:   . Lack of Transportation (Medical): Not on file  . Lack of Transportation (Non-Medical): Not on file  Physical Activity:   . Days of Exercise per Week: Not on file  . Minutes of Exercise per Session: Not on file  Stress:   . Feeling of Stress : Not on file  Social Connections:   . Frequency of Communication with Friends and Family: Not on file  . Frequency of Social Gatherings with Friends and Family: Not on file  . Attends Religious Services: Not on file  . Active Member of Clubs or Organizations: Not on file  . Attends Archivist Meetings: Not on file  . Marital Status: Not on file   Additional Social History:                         Sleep: Good  Appetite:  Fair  Current Medications: Current Facility-Administered Medications  Medication Dose Route Frequency Provider Last Rate Last Admin  . acetaminophen (TYLENOL) tablet 650 mg  650 mg Oral Q6H PRN [name], MD      . alum & mag hydroxide-simeth (MAALOX/MYLANTA) 200-200-20 MG/5ML suspension 30 mL  30 mL Oral Q4H PRN [name], MD      . aspirin EC tablet 81 mg  81 mg Oral Daily [name], [name], MD   81 mg at 03/10/20 0754  . clonazePAM (KLONOPIN) tablet 1 mg  1 mg Oral QID [name], [name], MD   1 mg at 03/10/20 1218  . docusate sodium (COLACE) capsule 100 mg  100 mg Oral BID [name], [name], MD   100 mg at 03/10/20 0755  . gabapentin (NEURONTIN) capsule 100 mg  100 mg Oral TID [name], [name], MD   100 mg at 03/10/20 1218  . hydrOXYzine (ATARAX/VISTARIL) tablet 10 mg  10 mg Oral TID PRN [name], MD   10 mg at 02/20/20 2127  . lamoTRIgine (LAMICTAL) tablet 100 mg  100 mg Oral QHS [name], [name], MD   100 mg at 03/09/20 2107  . linagliptin (TRADJENTA) tablet 5 mg  5 mg Oral Daily [name], [name], MD   5 mg at 03/10/20 0754  .  loratadine (CLARITIN) tablet 10 mg  10 mg Oral Daily [name], [name], MD   10 mg at 03/10/20 0757  . magnesium hydroxide (MILK OF MAGNESIA) suspension 30 mL  30 mL Oral Daily PRN [name], MD   30 mL at 03/06/20 1228  . metFORMIN (GLUCOPHAGE) tablet 850 mg  850 mg Oral BID WC [name], [name], MD   850 mg at 03/10/20 0756  .  metoprolol succinate (TOPROL-XL) 24 hr tablet 50 mg  50 mg Oral Daily [name], [name], MD   50 mg at 03/10/20 0754  . oxybutynin (DITROPAN) tablet 10 mg  10 mg Oral BID [name], [name], MD   10 mg at 03/10/20 0757  . pantoprazole (PROTONIX) EC tablet 40 mg  40 mg Oral Daily [name], [name], MD   40 mg at 03/10/20 0756  . prazosin (MINIPRESS) capsule 1 mg  1 mg Oral QHS [name], [name], MD   1 mg at 03/09/20 2107  . QUEtiapine (SEROQUEL) tablet 100 mg  100 mg Oral QID [name], [name], MD   100 mg at 03/10/20 1218  . QUEtiapine (SEROQUEL) tablet 400 mg  400 mg Oral QHS [name], NP   400 mg at 03/09/20 2106  . sertraline (ZOLOFT) tablet 100 mg  100 mg Oral BID [name], [name], MD   100 mg at 03/10/20 0756  . simethicone (MYLICON) chewable tablet 80 mg  80 mg Oral TID [name], [name], MD   80 mg at 03/10/20 1218  . simvastatin (ZOCOR) tablet 20 mg  20 mg Oral q1800 [name], [name], MD   20 mg at 03/09/20 1730  . tamsulosin (FLOMAX) capsule 0.8 mg  0.8 mg Oral QPC supper [name], [name], MD   0.8 mg at 03/09/20 1730    Lab Results: No results found for this or any previous visit (from the past 48 hour(s)).  Blood Alcohol level:  Lab Results  Component Value Date   ETH <10 01/22/2020   ETH <10 16/01/3709    Metabolic Disorder Labs: Lab Results  Component Value Date   HGBA1C 6.6 (H) 02/20/2020   MPG 142.72 02/20/2020   No results found for: PROLACTIN Lab Results  Component Value Date   CHOL 153 04/10/2015   TRIG 166 (H) 04/10/2015   HDL 50 04/10/2015   CHOLHDL 3.1 04/10/2015   VLDL 33 04/10/2015   LDLCALC 70 04/10/2015    Physical  Findings: AIMS:  , ,  ,  ,    CIWA:    COWS:     Musculoskeletal: Strength & Muscle Tone: within normal limits Gait & Station: normal Patient leans: N/A  Psychiatric Specialty Exam: Physical Exam  Review of Systems  Blood pressure 132/81, pulse 60, temperature 98.2 F (36.8 C), temperature source Oral, resp. rate 18, height 5\' 8"  (1.727 m), weight 104.3 kg, SpO2 100 %.Body mass index is 34.96 kg/m.  General Appearance: Casual  Eye Contact:  Fair  Speech:  Clear and Coherent and Normal Rate  Volume:  Normal  Mood:  Euthymic  Affect:  Congruent and Full Range  Thought Process:  Coherent, Goal Directed and Descriptions of Associations: Intact  Orientation:  Full (Time, Place, and Person)  Thought Content:  Logical and Rumination  Suicidal Thoughts:  No  Homicidal Thoughts:  No  Memory:  Immediate;   Fair Recent;   Fair Remote;   Fair  Judgement:  Intact  Insight:  Present  Psychomotor Activity:  Normal  Concentration:  Concentration: Fair and Attention Span: Fair  Recall:  [employer] of Knowledge:  Fair  Language:  Fair  Akathisia:  No  Handed:  Right  AIMS (if indicated):     Assets:  Communication Skills Desire for Improvement Social Support  ADL's:  Intact  Cognition:  WNL  Sleep:  Number of Hours: 5.75     Treatment Plan Summary: Daily contact with patient to assess and evaluate  symptoms and progress in treatment and Medication management Patient's vital and chart reviewed.  Patient continues to be stable both psychiatrically and medically and no medication changes made.  Discussed again with social work options for patient in regards to placement, Education officer, museum working with patient's brother Hampton Abbot, MD 03/10/2020, 2:58 PM

## 2020-03-10 NOTE — Progress Notes (Signed)
Recreation Therapy Notes   Date: 03/10/2020  Time: 9:30 am  Location: Craft room   Behavioral response: Appropriate  Intervention Topic: Self-esteem  Discussion/Intervention:  Group content today was focused on self-esteem. Patient defined self-esteem and where it comes form. The group described reasons self-esteem is important. Individuals stated things that impact self-esteem and positive ways to improve self-esteem. The group participated in the intervention "Collage of Me" where patients were able to create a collage of positive things that makes them who they are. Clinical Observations/Feedback:  Patient came to group and defined self-esteem as doing things well and getting compliments. Individual was social with peers and staff while participating in the intervention. Mayana Irigoyen LRT/CTRS         Jency Schnieders 03/10/2020 2:00 PM

## 2020-03-10 NOTE — Plan of Care (Signed)
  Problem: Education: Goal: Knowledge of [facility] Education information/materials will improve Outcome: Progressing Goal: Emotional status will improve Outcome: Progressing Goal: Mental status will improve Outcome: Progressing Goal: Verbalization of understanding the information provided will improve Outcome: Progressing   Problem: Activity: Goal: Interest or engagement in activities will improve Outcome: Progressing Goal: Sleeping patterns will improve Outcome: Progressing   

## 2020-03-10 NOTE — [facility] Group Notes (Signed)
[place] Group Notes:  (Nursing/MHT/Case Management/Adjunct)  Date:  03/10/2020  Time:  11:15 PM  Type of Therapy:  Group Therapy  Participation Level:  Did Not Attend  Participation Quality:  Came in group when it was over.  Nehemiah Settle 03/10/2020, 11:15 PM

## 2020-03-10 NOTE — Progress Notes (Signed)
°   03/10/20 1400  Clinical Encounter Type  Visited With Patient  Visit Type Follow-up;Spiritual support;Social support;Behavioral Health  Referral From Nurse  Consult/Referral To Chaplain  Visited pt while in the [facility]. Pt was telling me what he needs to do to get in a program.  I told Pt to keep doing good and things will workout for you. Ch will follow-up with Pt.

## 2020-03-11 NOTE — Progress Notes (Signed)
D- Patient alert and oriented. Affect/mood os somewhat anxious and cooperative. Pt denies SI, HI, AVH, and pain. Pt attended groups and has been doing his artwork.   A- Scheduled medications administered to patient, per MD orders. Support and encouragement provided.  Routine safety checks conducted every 15 minutes.  Patient informed to notify staff with problems or concerns.  R- No adverse drug reactions noted. Patient contracts for safety at this time. Patient compliant with medications and treatment plan. Patient receptive, calm, and cooperative. Patient interacts well with others on the unit.  Patient remains safe at this time.  Collier Bullock RN

## 2020-03-11 NOTE — Plan of Care (Signed)
  Problem: Group Participation °Goal: STG - Patient will engage in groups with a calm and appropriate mood at least 2x within 5 recreation therapy group sessions °Description: STG - Patient will engage in groups with a calm and appropriate mood at least 2x within 5 recreation therapy group sessions °Outcome: Progressing °  °

## 2020-03-11 NOTE — Plan of Care (Signed)
Pt denies depression, anxiety, SI, HI and AVH. Pt was educated on care plan and verbalizes understanding. Pt was encouraged to attend groups. Collier Bullock RN Problem: Education: Goal: Knowledge of [place] Education information/materials will improve Outcome: Progressing Goal: Emotional status will improve Outcome: Progressing Goal: Mental status will improve Outcome: Progressing Goal: Verbalization of understanding the information provided will improve Outcome: Progressing   Problem: Activity: Goal: Interest or engagement in activities will improve Outcome: Progressing Goal: Sleeping patterns will improve Outcome: Progressing   Problem: Coping: Goal: Ability to verbalize frustrations and anger appropriately will improve Outcome: Progressing Goal: Ability to demonstrate self-control will improve Outcome: Progressing   Problem: Health Behavior/Discharge Planning: Goal: Identification of resources available to assist in meeting health care needs will improve Outcome: Progressing Goal: Compliance with treatment plan for underlying cause of condition will improve Outcome: Progressing   Problem: Physical Regulation: Goal: Ability to maintain clinical measurements within normal limits will improve Outcome: Progressing   Problem: Safety: Goal: Periods of time without injury will increase Outcome: Progressing   Problem: Education: Goal: Ability to make informed decisions regarding treatment will improve Outcome: Progressing   Problem: Coping: Goal: Coping ability will improve Outcome: Progressing   Problem: Health Behavior/Discharge Planning: Goal: Identification of resources available to assist in meeting health care needs will improve Outcome: Progressing   Problem: Medication: Goal: Compliance with prescribed medication regimen will improve Outcome: Progressing   Problem: Self-Concept: Goal: Ability to disclose and discuss suicidal ideas will  improve Outcome: Progressing Goal: Will verbalize positive feelings about self Outcome: Progressing

## 2020-03-11 NOTE — Progress Notes (Signed)
Recreation Therapy Notes    Date: 03/10/2020  Time: 9:30 am  Location: Craft room   Behavioral response: Appropriate  Intervention Topic: Coping skills   Discussion/Intervention:  Group content on today was focused on coping skills. The group defined what coping skills are and when they normally use coping skills. Individuals described how they normally cope with thing and the coping skills they normally use. Patients expressed why it is important to cope with things and how not coping with things can affect you. The group participated in the intervention "My coping box" and made coping boxes while adding coping skills they could use in the future to the box. Clinical Observations/Feedback:  Patient came to group late and was focused on what peers and staff had to say about coping skills. Individual was social with peers and staff while participating in the intervention.  [name] LRT/CTRS        Alexander Beard 03/11/2020 11:39 AM

## 2020-03-11 NOTE — Progress Notes (Signed)
[facility] MD Progress Note  03/11/2020 1:55 PM Alexander Beard  MRN:  [id number] Subjective:  " I just do not have my group home YET !  Principal Problem: Schizoaffective disorder, bipolar type ([place]) Diagnosis: Principal Problem:   Schizoaffective disorder, bipolar type ([place]) Active Problems:   Active autistic disorder   Diabetes ([place])   Hypertension   Prostate hypertrophy   Intellectual disability   At risk for elopement Adjustment disorder  Phase of life problem  Housing    Total Time spent with patient:   20     Past Psychiatric History:  Already written   Past Medical History:  Past Medical History:  Diagnosis Date  . Anxiety   . Anxiety disorder due to known physiological condition    HOSPITALIZED 10/18  . Arthritis   . Autistic disorder, residual state   . COPD (chronic obstructive pulmonary disease) ([place])   . Depression   . Developmental disorder   . Diabetes mellitus without complication ([place])   . Dyslipidemia   . Esophageal reflux   . HOH (hard of hearing)    MILDLY  . Hypertension   . Obesity   . Overactive bladder   . Palpitations    ANXIETY  . Schizophrenia, schizoaffective ([place])   . Sleep apnea   . Tremors of nervous system    HANDS DUE TO MEDICATIONS  . Urinary incontinence     Past Surgical History:  Procedure Laterality Date  . CATARACT EXTRACTION W/PHACO Left 11/14/2017   Procedure: CATARACT EXTRACTION PHACO AND INTRAOCULAR LENS PLACEMENT (IOC);  Surgeon: Birder Robson, MD;  Location: [facility] ORS;  Service: Ophthalmology;  Laterality: Left;  [place] 00:42 AP% 14.6 CDE 6.12 Fluid pack lot # [id number]  . COLONOSCOPY  01/28/2005  . COLONOSCOPY WITH PROPOFOL N/A 05/09/2016   Procedure: COLONOSCOPY WITH PROPOFOL;  Surgeon: Manya Silvas, MD;  Location: [facility] ENDOSCOPY;  Service: Endoscopy;  Laterality: N/A;  . ESOPHAGOGASTRODUODENOSCOPY N/A 05/09/2016   Procedure: ESOPHAGOGASTRODUODENOSCOPY (EGD);  Surgeon: Manya Silvas, MD;  Location: [facility]  ENDOSCOPY;  Service: Endoscopy;  Laterality: N/A;  . FRACTURE SURGERY     ORIF SHOULDER  . TONSILLECTOMY     Family History:  Family History  Problem Relation Age of Onset  . Hypertension Mother   . Stroke Father   . Heart Problems Father    Family Psychiatric  History: discussed prior  Social History:  Social History   Substance and Sexual Activity  Alcohol Use No  . Alcohol/week: 0.0 standard drinks     Social History   Substance and Sexual Activity  Drug Use No    Social History   Socioeconomic History  . Marital status: Single    Spouse name: Not on file  . Number of children: Not on file  . Years of education: Not on file  . Highest education level: Not on file  Occupational History  . Not on file  Tobacco Use  . Smoking status: Never Smoker  . Smokeless tobacco: Never Used  Vaping Use  . Vaping Use: Never used  Substance and Sexual Activity  . Alcohol use: No    Alcohol/week: 0.0 standard drinks  . Drug use: No  . Sexual activity: Never  Other Topics Concern  . Not on file  Social History Narrative   The patient never finished high school but did get his GED. 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 He is currently in disability  and [name] is his legal guardian. He has been living in group homes for many years.      No pending legal charges   Social Determinants of Health   Financial Resource Strain:   . Difficulty of Paying Living Expenses: Not on file  Food Insecurity:   . Worried About Charity fundraiser in the Last Year: Not on file  . Ran Out of Food in the Last Year: Not on file  Transportation Needs:   . Lack of Transportation (Medical): Not on file  . Lack of Transportation (Non-Medical): Not on file  Physical Activity:   . Days of Exercise per Week: Not on file  . Minutes of Exercise per Session: Not on file  Stress:   . Feeling of Stress : Not on file  Social  Connections:   . Frequency of Communication with Friends and Family: Not on file  . Frequency of Social Gatherings with Friends and Family: Not on file  . Attends Religious Services: Not on file  . Active Member of Clubs or Organizations: Not on file  . Attends Archivist Meetings: Not on file  . Marital Status: Not on file   Additional Social History:         He is so frustrated regarding group home status.  Chronic issues, hopefully resolve soon   Currently at baseline with no med changes, side effects or new medical problems  Tried to offer supportive statements with what he can  handle                 Sleep: he says he feels okay   Appetite:  At times eats, at times stands strangely in front of food   Current Medications: Current Facility-Administered Medications  Medication Dose Route Frequency Provider Last Rate Last Admin  . acetaminophen (TYLENOL) tablet 650 mg  650 mg Oral Q6H PRN [name], MD      . alum & mag hydroxide-simeth (MAALOX/MYLANTA) 200-200-20 MG/5ML suspension 30 mL  30 mL Oral Q4H PRN [name], MD      . aspirin EC tablet 81 mg  81 mg Oral Daily [name], [name], MD   81 mg at 03/11/20 0842  . clonazePAM (KLONOPIN) tablet 1 mg  1 mg Oral QID [name], [name], MD   1 mg at 03/11/20 1238  . docusate sodium (COLACE) capsule 100 mg  100 mg Oral BID [name], [name], MD   100 mg at 03/11/20 0841  . gabapentin (NEURONTIN) capsule 100 mg  100 mg Oral TID [name], [name], MD   100 mg at 03/11/20 1238  . hydrOXYzine (ATARAX/VISTARIL) tablet 10 mg  10 mg Oral TID PRN [name], MD   10 mg at 02/20/20 2127  . lamoTRIgine (LAMICTAL) tablet 100 mg  100 mg Oral QHS [name], [name], MD   100 mg at 03/10/20 2217  . linagliptin (TRADJENTA) tablet 5 mg  5 mg Oral Daily [name], [name], MD   5 mg at 03/11/20 0842  . loratadine (CLARITIN) tablet 10 mg  10 mg Oral Daily [name], [name], MD   10 mg at 03/11/20 0843  . magnesium hydroxide (MILK  OF MAGNESIA) suspension 30 mL  30 mL Oral Daily PRN [name], MD   30 mL at 03/06/20 1228  . metFORMIN (GLUCOPHAGE) tablet 850 mg  850 mg Oral BID WC [name], [name], MD   850 mg at 03/11/20 0844  . metoprolol succinate (TOPROL-XL) 24  hr tablet 50 mg  50 mg Oral Daily [name], [name], MD   50 mg at 03/11/20 0843  . oxybutynin (DITROPAN) tablet 10 mg  10 mg Oral BID [name], [name], MD   10 mg at 03/11/20 0843  . pantoprazole (PROTONIX) EC tablet 40 mg  40 mg Oral Daily [name], [name], MD   40 mg at 03/11/20 0843  . prazosin (MINIPRESS) capsule 1 mg  1 mg Oral QHS [name], [name], MD   1 mg at 03/09/20 2107  . QUEtiapine (SEROQUEL) tablet 100 mg  100 mg Oral QID [name], [name], MD   100 mg at 03/11/20 1238  . QUEtiapine (SEROQUEL) tablet 400 mg  400 mg Oral QHS [name], NP   400 mg at 03/10/20 2216  . sertraline (ZOLOFT) tablet 100 mg  100 mg Oral BID [name], [name], MD   100 mg at 03/11/20 0842  . simethicone (MYLICON) chewable tablet 80 mg  80 mg Oral TID [name], [name], MD   80 mg at 03/11/20 1238  . simvastatin (ZOCOR) tablet 20 mg  20 mg Oral q1800 [name], [name], MD   20 mg at 03/10/20 1730  . tamsulosin (FLOMAX) capsule 0.8 mg  0.8 mg Oral QPC supper [name], [name], MD   0.8 mg at 03/10/20 1732    Lab Results: No results found for this or any previous visit (from the past 48 hour(s)).  Blood Alcohol level:  Lab Results  Component Value Date   ETH <10 01/22/2020   ETH <10 58/83/2549    Metabolic Disorder Labs: Lab Results  Component Value Date   HGBA1C 6.6 (H) 02/20/2020   MPG 142.72 02/20/2020   No results found for: PROLACTIN Lab Results  Component Value Date   CHOL 153 04/10/2015   TRIG 166 (H) 04/10/2015   HDL 50 04/10/2015   CHOLHDL 3.1 04/10/2015   VLDL 33 04/10/2015   LDLCALC 70 04/10/2015    Physical Findings: AIMS:  , ,  ,  ,   not done today  CIWA:    COWS:     Musculoskeletal: Strength & Muscle Tone: as normal as possible  Gait  & Station: somewhat tense rigid gate  Patient leans: n/a   Cognition chronically low  Assets --does okay with structure Recall fair  Handedness not known  Akathisia not see Sleep improving Language English  Aims not needed right now   Alert unkept ---distraught haggard forlorn  Oriented times four  Rapport and eye contact fair  Mood and affect somewhat depressed and anxious  Speech somewhat loud and pressured at baseline Thought process and content --chronic paranoia and anxiety and fear no new change Memory about the same Judgement insight reliability fair to poor Intellectual and fund of knowledge fair poor Abstraction fair Movements no tices shakes tremors SI and HI ----none so far today            Psychiatric Specialty Exam: Physical Exam  Review of Systems  Blood pressure 127/76, pulse 86, temperature 99 F (37.2 C), temperature source Oral, resp. rate 18, height 5\' 8"  (1.727 m), weight 104.3 kg, SpO2 100 %.Body mass index is 34.96 kg/m.  Treatment Plan Summary:  Caucasian male at baseline stable for now on meds still awaits placement to group home   --same routine inpatient care continues      [name], MD 03/11/2020, 1:55 PM

## 2020-03-11 NOTE — BHH Counselor (Signed)
CSW contacted the following in an effort to locate possible male or male beds to assist the patient in their placement search.:  [place], [phone] CSW left HIPAA compliant voicemail.  [facility], [phone] Voicemail box was full.  [place], [phone] CSW was informed that both homes are for males and are at capacity.   [facility], [phone] Received message that "memory is full, enter remote access code".   [facility], [phone] No male or male beds available.   [place], [phone] Line rang incessantly without pickup or ability to leave a voicemail.   [facility], [phone] Line rang until noise like a fax machine started.  [facility], [phone] No male or male beds available.   [place], [place] box has not been set up.    [place], [phone] Received message that "memory is full, enter remote access code".   [facility], [phone] CSW spoke with Chase Caller who reports that he "will have 3 male beds available next week".  He requests that information be sent to [phone] Attention Chase Caller.  CSW sent fax and received confirmation the fax was successful.   [facility], [phone] Line rang incessantly without pickup or ability to leave a voicemail.  [place] [phone] Line rang until noise like a fax machine started.  [place], [phone] Line rang until noise like a fax machine started.  [place], [phone] Interested in patient, however, would need to complete an interview first.   [employer] email can be sent to [email or web address] .  Interview is scheduled for at 1:30PM. FL2 was faxed to [phone]. [place], [phone] Line did not appear to be in working order.    [facility], [phone] CSW was asked to call [phone], Janetta Hora.  FL2 was sent for review.  Phone Interview for male pt scheduled for 11AM 03/12/2020.  [facility], [phone] Line rang until noise like a fax machine started.  [name], [phone] Male bed available.  Fax [phone].  CSW sent the requested information.   [facility], [phone] CSW left HIPAA compliant voicemail.  [facility] [facility], [phone] Beds are available but referrals must come from [place] wait list.   Assunta Curtis, MSW, LCSW 03/11/2020 3:32 PM

## 2020-03-11 NOTE — Progress Notes (Signed)
Patient is pleasant. He presents with anxious affect, but is easily reditrected when he begins to get overly excited. He is alert and oriented. He has been med compliant and tolerates his meds without incident.  He usually is seen in the dayroom being very active with his drawings and watching TV, but this evening he has been isolative to his room. He does have his drawings, that have kept him occupied.  He denies SI/HI/AVH depression and pain at this encounter. He does endorse anxiety, but is managing his symptoms. He is safe on the unit with 15 minute safety checks and informed to contact staff with any questions.   Cleo Butler-Nicholson, LPN

## 2020-03-11 NOTE — Plan of Care (Signed)
  Problem: Education: Goal: Knowledge of [facility] Education information/materials will improve Outcome: Progressing Goal: Emotional status will improve Outcome: Progressing Goal: Mental status will improve Outcome: Progressing Goal: Verbalization of understanding the information provided will improve Outcome: Progressing   Problem: Activity: Goal: Interest or engagement in activities will improve Outcome: Progressing Goal: Sleeping patterns will improve Outcome: Progressing   Problem: Coping: Goal: Ability to verbalize frustrations and anger appropriately will improve Outcome: Progressing Goal: Ability to demonstrate self-control will improve Outcome: Progressing   Problem: Health Behavior/Discharge Planning: Goal: Identification of resources available to assist in meeting health care needs will improve Outcome: Progressing Goal: Compliance with treatment plan for underlying cause of condition will improve Outcome: Progressing   Problem: Physical Regulation: Goal: Ability to maintain clinical measurements within normal limits will improve Outcome: Progressing   Problem: Safety: Goal: Periods of time without injury will increase Outcome: Progressing   Problem: Education: Goal: Ability to make informed decisions regarding treatment will improve Outcome: Progressing   Problem: Coping: Goal: Coping ability will improve Outcome: Progressing   Problem: Health Behavior/Discharge Planning: Goal: Identification of resources available to assist in meeting health care needs will improve Outcome: Progressing   Problem: Medication: Goal: Compliance with prescribed medication regimen will improve Outcome: Progressing   Problem: Self-Concept: Goal: Ability to disclose and discuss suicidal ideas will improve Outcome: Progressing Goal: Will verbalize positive feelings about self Outcome: Progressing   

## 2020-03-12 NOTE — Plan of Care (Signed)
  Problem: Coping: Goal: Ability to demonstrate self-control will improve Outcome: Progressing   Problem: Health Behavior/Discharge Planning: Goal: Compliance with treatment plan for underlying cause of condition will improve Outcome: Progressing   Problem: Safety: Goal: Periods of time without injury will increase Outcome: Progressing

## 2020-03-12 NOTE — Progress Notes (Signed)
[facility] MD Progress Note  03/12/2020 3:10 PM Alexander Beard  MRN:  [id number] Subjective:   I am waiting for our group home  Principal Problem: Schizoaffective disorder, bipolar type ([place]) Diagnosis: Principal Problem:   Schizoaffective disorder, bipolar type ([place]) Active Problems:   Active autistic disorder   Diabetes ([place])   Hypertension   Prostate hypertrophy   Intellectual disability   At risk for elopement  Adjustment disorder  Housing issues    Total Time spent with patient:   10-20---minutes     Past Psychiatric History:    Ongoing long term history ---    Past Medical History:  Past Medical History:  Diagnosis Date  . Anxiety   . Anxiety disorder due to known physiological condition    HOSPITALIZED 10/18  . Arthritis   . Autistic disorder, residual state   . COPD (chronic obstructive pulmonary disease) ([place])   . Depression   . Developmental disorder   . Diabetes mellitus without complication ([place])   . Dyslipidemia   . Esophageal reflux   . HOH (hard of hearing)    MILDLY  . Hypertension   . Obesity   . Overactive bladder   . Palpitations    ANXIETY  . Schizophrenia, schizoaffective ([place])   . Sleep apnea   . Tremors of nervous system    HANDS DUE TO MEDICATIONS  . Urinary incontinence     Past Surgical History:  Procedure Laterality Date  . CATARACT EXTRACTION W/PHACO Left 11/14/2017   Procedure: CATARACT EXTRACTION PHACO AND INTRAOCULAR LENS PLACEMENT (IOC);  Surgeon: Birder Robson, MD;  Location: [facility] ORS;  Service: Ophthalmology;  Laterality: Left;  [place] 00:42 AP% 14.6 CDE 6.12 Fluid pack lot # [id number]  . COLONOSCOPY  01/28/2005  . COLONOSCOPY WITH PROPOFOL N/A 05/09/2016   Procedure: COLONOSCOPY WITH PROPOFOL;  Surgeon: Manya Silvas, MD;  Location: [facility] ENDOSCOPY;  Service: Endoscopy;  Laterality: N/A;  . ESOPHAGOGASTRODUODENOSCOPY N/A 05/09/2016   Procedure: ESOPHAGOGASTRODUODENOSCOPY (EGD);  Surgeon: Manya Silvas, MD;  Location: [facility]  ENDOSCOPY;  Service: Endoscopy;  Laterality: N/A;  . FRACTURE SURGERY     ORIF SHOULDER  . TONSILLECTOMY     Family History:  Family History  Problem Relation Age of Onset  . Hypertension Mother   . Stroke Father   . Heart Problems Father    Family Psychiatric  History:    Already discussed  Social History:  Social History   Substance and Sexual Activity  Alcohol Use No  . Alcohol/week: 0.0 standard drinks     Social History   Substance and Sexual Activity  Drug Use No    Social History   Socioeconomic History  . Marital status: Single    Spouse name: Not on file  . Number of children: Not on file  . Years of education: Not on file  . Highest education level: Not on file  Occupational History  . Not on file  Tobacco Use  . Smoking status: Never Smoker  . Smokeless tobacco: Never Used  Vaping Use  . Vaping Use: Never used  Substance and Sexual Activity  . Alcohol use: No    Alcohol/week: 0.0 standard drinks  . Drug use: No  . Sexual activity: Never  Other Topics Concern  . Not on file  Social History Narrative   The patient never finished high school but did get his GED. 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 He is  currently in disability and [name] is his legal guardian. He has been living in group homes for many years.      No pending legal charges   Social Determinants of Health   Financial Resource Strain:   . Difficulty of Paying Living Expenses: Not on file  Food Insecurity:   . Worried About Charity fundraiser in the Last Year: Not on file  . Ran Out of Food in the Last Year: Not on file  Transportation Needs:   . Lack of Transportation (Medical): Not on file  . Lack of Transportation (Non-Medical): Not on file  Physical Activity:   . Days of Exercise per Week: Not on file  . Minutes of Exercise per Session: Not on file  Stress:   . Feeling of Stress : Not on file   Social Connections:   . Frequency of Communication with Friends and Family: Not on file  . Frequency of Social Gatherings with Friends and Family: Not on file  . Attends Religious Services: Not on file  . Active Member of Clubs or Organizations: Not on file  . Attends Archivist Meetings: Not on file  . Marital Status: Not on file   Additional Social History:      Group home issues are still vague He has been here for long term time so far                         Sleep: improving   Appetite:  Normal     Current Medications: Current Facility-Administered Medications  Medication Dose Route Frequency Provider Last Rate Last Admin  . acetaminophen (TYLENOL) tablet 650 mg  650 mg Oral Q6H PRN [name], MD      . alum & mag hydroxide-simeth (MAALOX/MYLANTA) 200-200-20 MG/5ML suspension 30 mL  30 mL Oral Q4H PRN [name], MD      . aspirin EC tablet 81 mg  81 mg Oral Daily [name], [name], MD   81 mg at 03/12/20 2542  . clonazePAM (KLONOPIN) tablet 1 mg  1 mg Oral QID [name], [name], MD   1 mg at 03/12/20 1223  . docusate sodium (COLACE) capsule 100 mg  100 mg Oral BID [name], [name], MD   100 mg at 03/12/20 7062  . gabapentin (NEURONTIN) capsule 100 mg  100 mg Oral TID [name], [name], MD   100 mg at 03/12/20 1223  . hydrOXYzine (ATARAX/VISTARIL) tablet 10 mg  10 mg Oral TID PRN [name], MD   10 mg at 02/20/20 2127  . lamoTRIgine (LAMICTAL) tablet 100 mg  100 mg Oral QHS [name], [name], MD   100 mg at 03/11/20 2150  . linagliptin (TRADJENTA) tablet 5 mg  5 mg Oral Daily [name], [name], MD   5 mg at 03/12/20 0834  . loratadine (CLARITIN) tablet 10 mg  10 mg Oral Daily [name], [name], MD   10 mg at 03/12/20 0834  . magnesium hydroxide (MILK OF MAGNESIA) suspension 30 mL  30 mL Oral Daily PRN [name], MD   30 mL at 03/06/20 1228  . metFORMIN (GLUCOPHAGE) tablet 850 mg  850 mg Oral BID WC [name], [name], MD   850 mg at 03/12/20  0835  . metoprolol succinate (TOPROL-XL) 24 hr tablet 50 mg  50 mg Oral Daily [name], [name], MD   50 mg at 03/12/20 3762  . oxybutynin (DITROPAN) tablet 10 mg  10  mg Oral BID [name], [name], MD   10 mg at 03/12/20 0835  . pantoprazole (PROTONIX) EC tablet 40 mg  40 mg Oral Daily [name], [name], MD   40 mg at 03/12/20 0834  . prazosin (MINIPRESS) capsule 1 mg  1 mg Oral QHS [name], [name], MD   1 mg at 03/09/20 2107  . QUEtiapine (SEROQUEL) tablet 100 mg  100 mg Oral QID [name], [name], MD   100 mg at 03/12/20 1223  . QUEtiapine (SEROQUEL) tablet 400 mg  400 mg Oral QHS [name], NP   400 mg at 03/11/20 2149  . sertraline (ZOLOFT) tablet 100 mg  100 mg Oral BID [name], [name], MD   100 mg at 03/12/20 2094  . simethicone (MYLICON) chewable tablet 80 mg  80 mg Oral TID [name], [name], MD   80 mg at 03/12/20 1223  . simvastatin (ZOCOR) tablet 20 mg  20 mg Oral q1800 [name], [name], MD   20 mg at 03/11/20 1726  . tamsulosin (FLOMAX) capsule 0.8 mg  0.8 mg Oral QPC supper [name], [name], MD   0.8 mg at 03/11/20 1728    Lab Results: No results found for this or any previous visit (from the past 48 hour(s)).  Blood Alcohol level:  Lab Results  Component Value Date   ETH <10 01/22/2020   ETH <10 70/96/2836    Metabolic Disorder Labs: Lab Results  Component Value Date   HGBA1C 6.6 (H) 02/20/2020   MPG 142.72 02/20/2020   No results found for: PROLACTIN Lab Results  Component Value Date   CHOL 153 04/10/2015   TRIG 166 (H) 04/10/2015   HDL 50 04/10/2015   CHOLHDL 3.1 04/10/2015   VLDL 33 04/10/2015   LDLCALC 70 04/10/2015    Physical Findings: AIMS:  , ,  ,  ,    ---Not done today  CIWA:    COWS:     Musculoskeletal: Strength & Muscle Tone: about the same  Gait & Station: slow  Patient leans: n/a   Psychiatric Specialty Exam: Physical Exam  Review of Systems  Blood pressure 131/72, pulse 71, temperature 98.7 F (37.1 C), temperature source Oral, resp.  rate 17, height 5\' 8"  (1.727 m), weight 104.3 kg, SpO2 99 %.Body mass index is 34.96 kg/m.  Mental Status    Alert cooperative oriented times two  Mood and affect about the same Appearance ---older haggard/ forlorn  Concentration and attention ---fair Consciousness not clouded or fluctuant Thought process and content chronic baseline Memory ---about the same ---has some deficits SI and HI ----contracts for safety  Judgement insight reliability impaired chronically  intelligence and fund of knowledge gradual decline No severe movements noted Speech ---at baseline Abstraction limited    Assets --has a good social work Financial risk analyst gradual decline Recall gradually less Handedness not known Language  Same Psychomotor activity at baseline Sleep better Akathisia chronic slight at baseline                                                              Sleep:  Number of Hours: 6.5     Treatment Plan Summary:  At this time basically at his own awaiting his placement --which has been a slow process  Daily supportive statements  made overall  No other new side effects  Or medical problems     [name], MD 03/12/2020, 3:10 PM

## 2020-03-12 NOTE — Progress Notes (Signed)
Patient alert and orient. Pleasant and cooperative. Denies any SI, Hi, AVH. Isolative to self. Spends most of time in dayroom coloring and writing.  Encouragement and support provided. Safety checks maintained. Medications given as prescribed.  Pt receptive and remains safe on unit with q 15 min checks.

## 2020-03-12 NOTE — Plan of Care (Signed)
Pt denies depression, anxiety, SI, HI and AVH. Pt was educated on care plan and verbalizes understanding. Pt was encouraged to attend groups. Collier Bullock RN Problem: Education: Goal: Knowledge of [facility] Education information/materials will improve Outcome: Progressing Goal: Emotional status will improve Outcome: Progressing Goal: Mental status will improve Outcome: Progressing Goal: Verbalization of understanding the information provided will improve Outcome: Progressing   Problem: Activity: Goal: Interest or engagement in activities will improve Outcome: Progressing Goal: Sleeping patterns will improve Outcome: Progressing   Problem: Coping: Goal: Ability to verbalize frustrations and anger appropriately will improve Outcome: Progressing Goal: Ability to demonstrate self-control will improve Outcome: Progressing   Problem: Health Behavior/Discharge Planning: Goal: Identification of resources available to assist in meeting health care needs will improve Outcome: Progressing Goal: Compliance with treatment plan for underlying cause of condition will improve Outcome: Progressing   Problem: Physical Regulation: Goal: Ability to maintain clinical measurements within normal limits will improve Outcome: Progressing   Problem: Safety: Goal: Periods of time without injury will increase Outcome: Progressing   Problem: Education: Goal: Ability to make informed decisions regarding treatment will improve Outcome: Progressing   Problem: Coping: Goal: Coping ability will improve Outcome: Progressing   Problem: Health Behavior/Discharge Planning: Goal: Identification of resources available to assist in meeting health care needs will improve Outcome: Progressing   Problem: Medication: Goal: Compliance with prescribed medication regimen will improve Outcome: Progressing   Problem: Self-Concept: Goal: Ability to disclose and discuss suicidal ideas will  improve Outcome: Progressing Goal: Will verbalize positive feelings about self Outcome: Progressing

## 2020-03-12 NOTE — [facility] Counselor (Signed)
CSW supported the patient in completing the interview with Bethann Berkshire at [facility].    CSW will follow up.  Assunta Curtis, MSW, LCSW 03/12/2020 3:28 PM

## 2020-03-12 NOTE — Progress Notes (Signed)
BRIEF PHARMACY NOTE   This patient attended and participated in Medication Management Group counseling led by [facility] staff pharmacist.  This interactive class reviews basic information about prescription medications and education on personal responsibility in medication management.  The class also includes general knowledge of 3 main classes of behavioral medications, including antipsychotics, antidepressants, and mood stabilizers.     Patient behavior was appropriate for group setting.   Educational materials sourced from:  "Medication Do's and Don'ts" from [email or web address]   "Mental Health Medications" from [email or web address] [place] ,PharmD 03/12/2020 , 3:50 PM

## 2020-03-12 NOTE — Care Management (Signed)
Facilities Contacted  [facility]- Mr. Wynona Dove - [phone]- faxed FL2 to [phone]  [facility] - [phone]- no beds available  [facility] - [phone]- no beds available  [facility] - [phone]- left HIPPA compliant voicemail  [facility] - Left HIPPA compliant voicemail  [phone] HIPPA compliant voicemail (affiliated with [facility])  [phone]- no beds available  [facility] - [phone]- # not in service  [facility] - [phone] - - left HIPPA compliant voicemail  [facility] - [phone]- left HIPPA compliant voicemail

## 2020-03-12 NOTE — Progress Notes (Signed)
Recreation Therapy Notes   Date: 03/12/2020  Time: 10:30 am  Location: Craft room   Behavioral response: Appropriate  Intervention Topic: Leisure   Discussion/Intervention:  Group content today was focused on leisure. The group defined what leisure is and some positive leisure activities they participate in. Individuals identified the difference between good and bad leisure. Participants expressed how they feel after participating in the leisure of their choice. The group discussed how they go about picking a leisure activity and if others are involved in their leisure activities. The patient stated how many leisure activities they have to choose from and reasons why it is important to have leisure time. Individuals participated in the intervention "Exploration of Leisure" where they had a chance to identify new leisure activities as well as benefits of leisure. Clinical Observations/Feedback:  Patient came to group and explained that he particpates in leisure to have free time to do what he enjoys. He explained that he enjoys coloring and drawing in his free time. Individual was social with peers and staff while participating in the intervention.  Quyen Cutsforth LRT/CTRS         Secily Walthour 03/12/2020 2:44 PM

## 2020-03-12 NOTE — Tx Team (Signed)
Interdisciplinary Treatment and Diagnostic Plan Update  03/12/2020 Time of Session: 9:30 AM Alexander Beard MRN: [id number]  Principal Diagnosis: Schizoaffective disorder, bipolar type ([place])  Secondary Diagnoses: Principal Problem:   Schizoaffective disorder, bipolar type ([place]) Active Problems:   Active autistic disorder   Diabetes ([place])   Hypertension   Prostate hypertrophy   Intellectual disability   At risk for elopement   Current Medications:  Current Facility-Administered Medications  Medication Dose Route Frequency Provider Last Rate Last Admin  . acetaminophen (TYLENOL) tablet 650 mg  650 mg Oral Q6H PRN [name], MD      . alum & mag hydroxide-simeth (MAALOX/MYLANTA) 200-200-20 MG/5ML suspension 30 mL  30 mL Oral Q4H PRN [name], MD      . aspirin EC tablet 81 mg  81 mg Oral Daily [name], [name], MD   81 mg at 03/12/20 6314  . clonazePAM (KLONOPIN) tablet 1 mg  1 mg Oral QID [name], [name], MD   1 mg at 03/12/20 1223  . docusate sodium (COLACE) capsule 100 mg  100 mg Oral BID [name], [name], MD   100 mg at 03/12/20 9702  . gabapentin (NEURONTIN) capsule 100 mg  100 mg Oral TID [name], [name], MD   100 mg at 03/12/20 1223  . hydrOXYzine (ATARAX/VISTARIL) tablet 10 mg  10 mg Oral TID PRN [name], MD   10 mg at 02/20/20 2127  . lamoTRIgine (LAMICTAL) tablet 100 mg  100 mg Oral QHS [name], [name], MD   100 mg at 03/11/20 2150  . linagliptin (TRADJENTA) tablet 5 mg  5 mg Oral Daily [name], [name], MD   5 mg at 03/12/20 0834  . loratadine (CLARITIN) tablet 10 mg  10 mg Oral Daily [name], [name], MD   10 mg at 03/12/20 0834  . magnesium hydroxide (MILK OF MAGNESIA) suspension 30 mL  30 mL Oral Daily PRN [name], MD   30 mL at 03/06/20 1228  . metFORMIN (GLUCOPHAGE) tablet 850 mg  850 mg Oral BID WC [name], [name], MD   850 mg at 03/12/20 0835  . metoprolol succinate (TOPROL-XL) 24 hr tablet 50 mg  50 mg Oral Daily [name], [name], MD   50 mg  at 03/12/20 6378  . oxybutynin (DITROPAN) tablet 10 mg  10 mg Oral BID [name], [name], MD   10 mg at 03/12/20 0835  . pantoprazole (PROTONIX) EC tablet 40 mg  40 mg Oral Daily [name], [name], MD   40 mg at 03/12/20 0834  . prazosin (MINIPRESS) capsule 1 mg  1 mg Oral QHS [name], [name], MD   1 mg at 03/09/20 2107  . QUEtiapine (SEROQUEL) tablet 100 mg  100 mg Oral QID [name], [name], MD   100 mg at 03/12/20 1223  . QUEtiapine (SEROQUEL) tablet 400 mg  400 mg Oral QHS [name], NP   400 mg at 03/11/20 2149  . sertraline (ZOLOFT) tablet 100 mg  100 mg Oral BID [name], [name], MD   100 mg at 03/12/20 5885  . simethicone (MYLICON) chewable tablet 80 mg  80 mg Oral TID [name], [name], MD   80 mg at 03/12/20 1223  . simvastatin (ZOCOR) tablet 20 mg  20 mg Oral q1800 [name], [name], MD   20 mg at 03/11/20 1726  . tamsulosin (FLOMAX) capsule 0.8 mg  0.8 mg Oral QPC supper [name], [name], MD   0.8 mg at 03/11/20 1728  PTA Medications: Medications Prior to Admission  Medication Sig Dispense Refill Last Dose  . acetaminophen (TYLENOL) 650 MG CR tablet Take 650 mg by mouth every 12 (twelve) hours as needed for pain.     Marland Kitchen aspirin EC 81 MG tablet Take 81 mg by mouth daily.     . Calcium Carbonate-Vitamin D3 (CALCIUM 600-D) 600-400 MG-UNIT TABS Take 1 tablet by mouth 2 (two) times daily.     . Cholecalciferol (VITAMIN D-1000 MAX ST) 1000 UNITS tablet Take 1,000 Units by mouth daily.      Marland Kitchen docusate sodium (COLACE) 100 MG capsule Take 100 mg by mouth daily.      . furosemide (LASIX) 20 MG tablet Take 20 mg by mouth daily.      Marland Kitchen gabapentin (NEURONTIN) 100 MG capsule Take 1 capsule (100 mg total) by mouth 3 (three) times daily. 90 capsule 10   . Insulin Detemir (LEVEMIR FLEXTOUCH) 100 UNIT/ML Pen Inject 15 Units into the skin daily at 10 pm.     . lamoTRIgine (LAMICTAL) 100 MG tablet Take 1 tablet (100 mg total) by mouth at bedtime. 30 tablet 10   . loratadine (CLARITIN) 10 MG tablet Take  1 tablet (10 mg total) by mouth daily. 30 tablet 5   . LORazepam (ATIVAN) 1 MG tablet Take 1 tablet (1 mg total) by mouth 3 (three) times daily. 90 tablet 5   . lurasidone (LATUDA) 40 MG TABS tablet Take 1 tablet (40 mg total) by mouth daily. with food 30 tablet 10   . metoprolol succinate (TOPROL-XL) 50 MG 24 hr tablet Take 50 mg by mouth daily.      . NON FORMULARY cpap device     . oxybutynin (DITROPAN) 5 MG tablet Take 5 mg by mouth daily.     . pantoprazole (PROTONIX) 40 MG tablet Take 40 mg by mouth daily.     . polyethylene glycol (MIRALAX / GLYCOLAX) packet Take 17 g by mouth daily.     . QUEtiapine (SEROQUEL) 100 MG tablet TAKE 1 TABLET  BY MOUTH THREE TIMES A DAY (Patient taking differently: Take 100 mg by mouth 3 (three) times daily. TAKE 1 TABLET  BY MOUTH THREE TIMES A DAY) 90 tablet 11   . QUEtiapine (SEROQUEL) 400 MG tablet Take 1 tablet (400 mg total) by mouth at bedtime. 30 tablet 10   . sertraline (ZOLOFT) 100 MG tablet Take 2 tablets (200 mg total) by mouth daily. (Patient taking differently: Take 200 mg by mouth daily. Take along with two 50 mg tablets (100 mg) for total 300 mg daily) 60 tablet 5   . sertraline (ZOLOFT) 50 MG tablet Take 2 tablets (100 mg total) by mouth daily. (Patient taking differently: Take 100 mg by mouth daily. Take along with two 100 mg tablets (200 mg) for total 300 mg daily) 60 tablet 5   . simethicone (MYLICON) 80 MG chewable tablet Chew 80-160 mg by mouth 2 (two) times daily as needed for flatulence.     . simvastatin (ZOCOR) 20 MG tablet Take 20 mg by mouth at bedtime.      . sitaGLIPtin (JANUVIA) 100 MG tablet Take 100 mg by mouth daily.     . solifenacin (VESICARE) 5 MG tablet Take 5 mg by mouth daily.     . Starch (HEMORRHOIDAL RE) Place 1 application rectally 4 (four) times daily as needed (for itching).     . tamsulosin (FLOMAX) 0.4 MG CAPS capsule Take 0.4 mg by mouth daily.  Patient Stressors: Health problems Marital or family  conflict Medication change or noncompliance Traumatic event  Patient Strengths: Motivation for treatment/growth Religious Affiliation  Treatment Modalities: Medication Management, Group therapy, Case management,  1 to 1 session with clinician, Psychoeducation, Recreational therapy.   Physician Treatment Plan for Primary Diagnosis: Schizoaffective disorder, bipolar type ([place]) Long Term Goal(s): Improvement in symptoms so as ready for discharge Improvement in symptoms so as ready for discharge   Short Term Goals: Ability to verbalize feelings will improve Ability to demonstrate self-control will improve Ability to maintain clinical measurements within normal limits will improve Compliance with prescribed medications will improve  Medication Management: Evaluate patient's response, side effects, and tolerance of medication regimen.  Therapeutic Interventions: 1 to 1 sessions, Unit Group sessions and Medication administration.  Evaluation of Outcomes: Progressing  Physician Treatment Plan for Secondary Diagnosis: Principal Problem:   Schizoaffective disorder, bipolar type ([place]) Active Problems:   Active autistic disorder   Diabetes ([place])   Hypertension   Prostate hypertrophy   Intellectual disability   At risk for elopement  Long Term Goal(s): Improvement in symptoms so as ready for discharge Improvement in symptoms so as ready for discharge   Short Term Goals: Ability to verbalize feelings will improve Ability to demonstrate self-control will improve Ability to maintain clinical measurements within normal limits will improve Compliance with prescribed medications will improve     Medication Management: Evaluate patient's response, side effects, and tolerance of medication regimen.  Therapeutic Interventions: 1 to 1 sessions, Unit Group sessions and Medication administration.  Evaluation of Outcomes: Progressing   RN Treatment Plan for Primary Diagnosis: Schizoaffective  disorder, bipolar type ([place]) Long Term Goal(s): Knowledge of disease and therapeutic regimen to maintain health will improve  Short Term Goals: Ability to demonstrate self-control, Ability to participate in decision making will improve, Ability to verbalize feelings will improve, Ability to disclose and discuss suicidal ideas, Ability to identify and develop effective coping behaviors will improve and Compliance with prescribed medications will improve  Medication Management: RN will administer medications as ordered by provider, will assess and evaluate patient's response and provide education to patient for prescribed medication. RN will report any adverse and/or side effects to prescribing provider.  Therapeutic Interventions: 1 on 1 counseling sessions, Psychoeducation, Medication administration, Evaluate responses to treatment, Monitor vital signs and CBGs as ordered, Perform/monitor CIWA, COWS, AIMS and Fall Risk screenings as ordered, Perform wound care treatments as ordered.  Evaluation of Outcomes: Progressing   LCSW Treatment Plan for Primary Diagnosis: Schizoaffective disorder, bipolar type ([place]) Long Term Goal(s): Safe transition to appropriate next level of care at discharge, Engage patient in therapeutic group addressing interpersonal concerns.  Short Term Goals: Engage patient in aftercare planning with referrals and resources, Increase social support, Increase ability to appropriately verbalize feelings, Increase emotional regulation and Increase skills for wellness and recovery  Therapeutic Interventions: Assess for all discharge needs, 1 to 1 time with Social worker, Explore available resources and support systems, Assess for adequacy in community support network, Educate family and significant other(s) on suicide prevention, Complete Psychosocial Assessment, Interpersonal group therapy.  Evaluation of Outcomes: Progressing   Progress in Treatment: Attending groups:  Yes. Participating in groups: Yes. Taking medication as prescribed: Yes. Toleration medication: Yes. Family/Significant other contact made: Yes, individual(s) contacted:  [place], Brother  Patient understands diagnosis: Yes. Discussing patient identified problems/goals with staff: Yes. Medical problems stabilized or resolved: Yes. Denies suicidal/homicidal ideation: Yes. Issues/concerns per patient self-inventory: No. Other:   New problem(s) identified:  No, Describe:  None  New Short Term/Long Term Goal(s): Elimination of symptoms of psychosis, medication management for mood stabilization; elimination of SI thoughts; development of comprehensive mental wellness/sobriety plan. Update 03/01/20: No changes at this time. 03/07/20: Update, no changes at this time Update 03/12/2020:  No changes at this time.   Patient Goals:  Patient stated he would like to go back to [place] and reconnect with his peer support. Patient also stated that he would like to increase his social support and help his brother. Update 03/07/20, no change Update 03/12/2020:  No changes at this time.   Discharge Plan or Barriers: Patient has an interview with a group home on 03/09/20 at 2:00 PM. Update 03/12/2020:  CSW continues to assist the patient in developing a housing plan.   Reason for Continuation of Hospitalization: Anxiety Depression Medication stabilization  Estimated Length of Stay: TBD  Attendees: Patient: 03/12/2020 3:14 PM  Physician: Dr. Janese Banks, MD 03/12/2020 3:14 PM  Nursing:  03/12/2020 3:14 PM  RN Care Manager: 03/12/2020 3:14 PM  Social Worker: Assunta Curtis, [place] 03/12/2020 3:14 PM  Recreational Therapist:  03/12/2020 3:14 PM  Other:  03/12/2020 3:14 PM  Other:  03/12/2020 3:14 PM  Other: 03/12/2020 3:14 PM    Scribe for Treatment Team: [name], LCSW 03/12/2020 3:14 PM

## 2020-03-12 NOTE — Progress Notes (Signed)
   03/12/20 1400  Clinical Encounter Type  Visited With Patient  Visit Type Follow-up;Spiritual support;Social support;Behavioral Health  Referral From Nurse  Consult/Referral To Chaplain  Visited pt per [facility]in the [facility] Pt was telling me what he needs to do to get in a program.  I told Pt to keep doing good and things will workout for you. Ch will follow-up with Pt.

## 2020-03-12 NOTE — Progress Notes (Signed)
D- Patient alert and oriented. Affect/mood is calm and at times anxious. Pt denies SI, HI, AVH, and pain. Pt attended groups.   A- Scheduled medications administered to patient, per MD orders. Support and encouragement provided.  Routine safety checks conducted every 15 minutes.  Patient informed to notify staff with problems or concerns.  R- No adverse drug reactions noted. Patient contracts for safety at this time. Patient compliant with medications and treatment plan. Patient receptive, calm, and cooperative. Patient interacts well with others on the unit.  Patient remains safe at this time. Collier Bullock RN

## 2020-03-13 NOTE — Progress Notes (Signed)
Recreation Therapy Notes  Date: 03/13/2020  Time: 9:30 am  Location: Craft room   Behavioral response: Appropriate  Intervention Topic: Communication   Discussion/Intervention:  Group content today was focused on communication. The group defined communication and ways to communicate with others. Individuals stated reason why communication is important and some reasons to communicate with others. Patients expressed if they thought they were good at communicating with others and ways they could improve their communication skills. The group identified important parts of communication and some experiences they have had in the past with communication. The group participated in the intervention What is that?, where they had a chance to test out their communication skills and identify ways to improve their communication techniques.   Clinical Observations/Feedback:  Patient came to group and stated that communication is good behavior to others and standing up for your rights. Individual was social with peers and staff while participating in the intervention.  Rosella Crandell LRT/CTRS         Lequisha Cammack 03/13/2020 1:56 PM

## 2020-03-13 NOTE — Progress Notes (Addendum)
[facility] MD Progress Note  03/13/2020 2:26 PM Alexander Beard  MRN:  [id number] Subjective:  Look forward to group home  Principal Problem: Schizoaffective disorder, bipolar type ([place]) Diagnosis: Principal Problem:   Schizoaffective disorder, bipolar type ([place]) Active Problems:   Active autistic disorder   Diabetes ([place])   Hypertension   Prostate hypertrophy   Intellectual disability   At risk for elopement  Total Time spent with patient: 15  Past Psychiatric History: *already noted   Past Medical History:  Past Medical History:  Diagnosis Date   Anxiety    Anxiety disorder due to known physiological condition    HOSPITALIZED 10/18   Arthritis    Autistic disorder, residual state    COPD (chronic obstructive pulmonary disease) ([place])    Depression    Developmental disorder    Diabetes mellitus without complication (HCC)    Dyslipidemia    Esophageal reflux    HOH (hard of hearing)    MILDLY   Hypertension    Obesity    Overactive bladder    Palpitations    ANXIETY   Schizophrenia, schizoaffective (HCC)    Sleep apnea    Tremors of nervous system    HANDS DUE TO MEDICATIONS   Urinary incontinence     Past Surgical History:  Procedure Laterality Date   CATARACT EXTRACTION W/PHACO Left 11/14/2017   Procedure: CATARACT EXTRACTION PHACO AND INTRAOCULAR LENS PLACEMENT ([place]);  Surgeon: Birder Robson, MD;  Location: [facility] ORS;  Service: Ophthalmology;  Laterality: Left;  [place] 00:42 AP% 14.6 CDE 6.12 Fluid pack lot # [id number]   COLONOSCOPY  01/28/2005   COLONOSCOPY WITH PROPOFOL N/A 05/09/2016   Procedure: COLONOSCOPY WITH PROPOFOL;  Surgeon: Manya Silvas, MD;  Location: [facility] ENDOSCOPY;  Service: Endoscopy;  Laterality: N/A;   ESOPHAGOGASTRODUODENOSCOPY N/A 05/09/2016   Procedure: ESOPHAGOGASTRODUODENOSCOPY (EGD);  Surgeon: Manya Silvas, MD;  Location: [facility] ENDOSCOPY;  Service: Endoscopy;  Laterality: N/A;   FRACTURE SURGERY     ORIF SHOULDER    TONSILLECTOMY     Family History:  Family History  Problem Relation Age of Onset   Hypertension Mother    Stroke Father    Heart Problems Father    Family Psychiatric  History: already noted  Social History:  Social History   Substance and Sexual Activity  Alcohol Use No   Alcohol/week: 0.0 standard drinks     Social History   Substance and Sexual Activity  Drug Use No    Social History   Socioeconomic History   Marital status: Single    Spouse name: Not on file   Number of children: Not on file   Years of education: Not on file   Highest education level: Not on file  Occupational History   Not on file  Tobacco Use   Smoking status: Never Smoker   Smokeless tobacco: Never Used  Vaping Use   Vaping Use: Never used  Substance and Sexual Activity   Alcohol use: No    Alcohol/week: 0.0 standard drinks   Drug use: No   Sexual activity: Never  Other Topics Concern   Not on file  Social History Narrative   The patient never finished high school but did get his GED. 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 He is currently in disability and [name] is his legal guardian. He has been living in group homes for many years.  No pending legal charges   Social Determinants of Health   Financial Resource Strain:    Difficulty of Paying Living Expenses: Not on file  Food Insecurity:    Worried About [place] in the Last Year: Not on file   Ran Out of Food in the Last Year: Not on file  Transportation Needs:    Lack of Transportation (Medical): Not on file   Lack of Transportation (Non-Medical): Not on file  Physical Activity:    Days of Exercise per Week: Not on file   Minutes of Exercise per Session: Not on file  Stress:    Feeling of Stress : Not on file  Social Connections:    Frequency of Communication with Friends and Family: Not on file   Frequency  of Social Gatherings with Friends and Family: Not on file   Attends Religious Services: Not on file   Active Member of Clubs or Organizations: Not on file   Attends Archivist Meetings: Not on file   Marital Status: Not on file   Additional Social History:   Patient has a history of above problems Mainly now has disposition issues for new group home  No other psych or medical   He has an interview pending  It is a slow job because he has a history of wandering away and group homes do not want this.                         Sleep: same   Appetite:  Same   Current Medications: Current Facility-Administered Medications  Medication Dose Route Frequency Provider Last Rate Last Admin   acetaminophen (TYLENOL) tablet 650 mg  650 mg Oral Q6H PRN [name], MD       alum & mag hydroxide-simeth (MAALOX/MYLANTA) 200-200-20 MG/5ML suspension 30 mL  30 mL Oral Q4H PRN [name], MD       aspirin EC tablet 81 mg  81 mg Oral Daily [name], [name], MD   81 mg at 03/13/20 0914   clonazePAM (KLONOPIN) tablet 1 mg  1 mg Oral QID [name], [name], MD   1 mg at 03/13/20 1300   docusate sodium (COLACE) capsule 100 mg  100 mg Oral BID [name], [name], MD   100 mg at 03/13/20 0914   gabapentin (NEURONTIN) capsule 100 mg  100 mg Oral TID [name], [name], MD   100 mg at 03/13/20 1300   hydrOXYzine (ATARAX/VISTARIL) tablet 10 mg  10 mg Oral TID PRN [name], MD   10 mg at 02/20/20 2127   lamoTRIgine (LAMICTAL) tablet 100 mg  100 mg Oral QHS [name], [name], MD   100 mg at 03/12/20 2137   linagliptin (TRADJENTA) tablet 5 mg  5 mg Oral Daily [name], [name], MD   5 mg at 03/13/20 0914   loratadine (CLARITIN) tablet 10 mg  10 mg Oral Daily [name], [name], MD   10 mg at 03/13/20 0914   magnesium hydroxide (MILK OF MAGNESIA) suspension 30 mL  30 mL Oral Daily PRN [name], MD   30 mL at 03/06/20 1228   metFORMIN (GLUCOPHAGE) tablet 850 mg  850 mg  Oral BID WC [name], [name], MD   850 mg at 03/13/20 0914   metoprolol succinate (TOPROL-XL) 24 hr tablet 50 mg  50 mg Oral Daily [name], [name], MD   50 mg at 03/12/20 0833   oxybutynin (DITROPAN) tablet 10  mg  10 mg Oral BID [name], [name], MD   10 mg at 03/13/20 0914   pantoprazole (PROTONIX) EC tablet 40 mg  40 mg Oral Daily [name], [name], MD   40 mg at 03/13/20 0914   prazosin (MINIPRESS) capsule 1 mg  1 mg Oral QHS [name], [name], MD   1 mg at 03/12/20 2137   QUEtiapine (SEROQUEL) tablet 100 mg  100 mg Oral QID [name], [name], MD   100 mg at 03/13/20 1300   QUEtiapine (SEROQUEL) tablet 400 mg  400 mg Oral QHS [name], NP   400 mg at 03/12/20 2136   sertraline (ZOLOFT) tablet 100 mg  100 mg Oral BID [name], [name], MD   100 mg at 03/13/20 0913   simethicone (MYLICON) chewable tablet 80 mg  80 mg Oral TID [name], [name], MD   80 mg at 03/13/20 1300   simvastatin (ZOCOR) tablet 20 mg  20 mg Oral q1800 [name], [name], MD   20 mg at 03/12/20 1814   tamsulosin (FLOMAX) capsule 0.8 mg  0.8 mg Oral QPC supper [name], [name], MD   0.8 mg at 03/12/20 1853    Lab Results: No results found for this or any previous visit (from the past 47 hour(s)).  Blood Alcohol level:  Lab Results  Component Value Date   ETH <10 01/22/2020   ETH <10 16/01/3709    Metabolic Disorder Labs: Lab Results  Component Value Date   HGBA1C 6.6 (H) 02/20/2020   MPG 142.72 02/20/2020   No results found for: PROLACTIN Lab Results  Component Value Date   CHOL 153 04/10/2015   TRIG 166 (H) 04/10/2015   HDL 50 04/10/2015   CHOLHDL 3.1 04/10/2015   VLDL 33 04/10/2015   LDLCALC 70 04/10/2015    Physical Findings: AIMS:  , ,  ,  ,         Will c heck before discharge   CIWA:    COWS:     Musculoskeletal: SGtrength & Muscle Tone: same  Gait & Station: same  Patient leans: same   Psychiatric Specialty Exam: Physical Exam  Review of Systems  Blood pressure 123/73, pulse (!)  56, temperature 98.3 F (36.8 C), temperature source Oral, resp. rate 18, height 5\' 8"  (1.727 m), weight 104.3 kg, SpO2 100 %.Body mass index is 34.96 kg/m.  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 Oriented times three Mood and affect anxious Eye contact and rapport somewhat strange and odd Has chronic shakes and tremors Speech --same No new thought content and process issues No active SI HI or plans Judgement insight reliability all fair to mostly poor Cognition declining  Memory on and off deficits Abstraction somewhat concrete Fund of knowledge and intelligence declining  Concentration and attention fair Consciousness not clouded or fluctuant                                                       Language Normal Akathisia slightly chronic Recall fair to poor Fund of knowledge declining  Handedness ---not known Assets ---SW team is good Sleep average Cognition declining over time     Treatment Plan Summary:   Awaits group home placement mainly     [name],  MD 03/13/2020, 2:26 PM

## 2020-03-13 NOTE — Plan of Care (Signed)
D- Patient alert and oriented. Patient presented in a preoccupied, but pleasant mood on assessment stating that he slept "pretty good" last night and had no complaints to voice to this Probation officer. Patient has been fixated on his drawings/artwork and wanting them put up in the nurse's station and craft room. Patient denied SI, HI, AVH, and pain at this time. Patient also denied any signs/symptoms of depression/anxiety, stating "no, I don't think so". Patient reported that overall he is feeling "pretty good". Patient had no stated goals for today.  A- Scheduled medications administered to patient, per MD orders. Support and encouragement provided.  Routine safety checks conducted every 15 minutes.  Patient informed to notify staff with problems or concerns.  R- No adverse drug reactions noted. Patient contracts for safety at this time. Patient compliant with medications and treatment plan. Patient receptive, calm, and cooperative. Patient interacts well with others on the unit.  Patient remains safe at this time.  Problem: Education: Goal: Knowledge of [place] [facility] Education information/materials will improve Outcome: Progressing Goal: Emotional status will improve Outcome: Progressing Goal: Mental status will improve Outcome: Progressing Goal: Verbalization of understanding the information provided will improve Outcome: Progressing   Problem: Activity: Goal: Interest or engagement in activities will improve Outcome: Progressing Goal: Sleeping patterns will improve Outcome: Progressing   Problem: Coping: Goal: Ability to verbalize frustrations and anger appropriately will improve Outcome: Progressing Goal: Ability to demonstrate self-control will improve Outcome: Progressing   Problem: Health Behavior/Discharge Planning: Goal: Identification of resources available to assist in meeting health care needs will improve Outcome: Progressing Goal: Compliance with treatment plan for  underlying cause of condition will improve Outcome: Progressing   Problem: Physical Regulation: Goal: Ability to maintain clinical measurements within normal limits will improve Outcome: Progressing   Problem: Safety: Goal: Periods of time without injury will increase Outcome: Progressing   Problem: Education: Goal: Ability to make informed decisions regarding treatment will improve Outcome: Progressing   Problem: Coping: Goal: Coping ability will improve Outcome: Progressing   Problem: Health Behavior/Discharge Planning: Goal: Identification of resources available to assist in meeting health care needs will improve Outcome: Progressing   Problem: Medication: Goal: Compliance with prescribed medication regimen will improve Outcome: Progressing   Problem: Self-Concept: Goal: Ability to disclose and discuss suicidal ideas will improve Outcome: Progressing Goal: Will verbalize positive feelings about self Outcome: Progressing

## 2020-03-13 NOTE — BHH Counselor (Signed)
CSW called [employer], [phone], Bethann [employer], to follow up on interview for group home.  CSW was unable to speak with anyone or leave a HIPAA compliant voicemail.    CSW will continue to attempt to follow up.  Assunta Curtis, MSW, LCSW 03/13/2020 1:53 PM

## 2020-03-13 NOTE — Progress Notes (Signed)
Patient is working on his menu, so this Probation officer will administer morning medication once he finishes.

## 2020-03-13 NOTE — [facility] Group Notes (Signed)
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  Modes of Intervention:  Support  Summary of Progress/Problems:  Alexander Beard 03/13/2020, 9:12 PM

## 2020-03-14 LAB — GLUCOSE, CAPILLARY: Glucose-Capillary: 122 mg/dL — ABNORMAL HIGH (ref 70–99)

## 2020-03-14 NOTE — Plan of Care (Signed)
  Problem: Education: Goal: Knowledge of [place] [facility] Education information/materials will improve Outcome: Progressing Goal: Emotional status will improve Outcome: Progressing Goal: Mental status will improve Outcome: Progressing Goal: Verbalization of understanding the information provided will improve Outcome: Progressing   Problem: Activity: Goal: Interest or engagement in activities will improve Outcome: Progressing Goal: Sleeping patterns will improve Outcome: Progressing   

## 2020-03-14 NOTE — Progress Notes (Addendum)
Patient just came out of his room around 1250pm. He verbalized he was tired and slept late this morning. Medications administered at 1256. Discussed this at this time with Dr. Danella Sensing as some are due again  at 1700. She stated it is okay to give all again at 1700 but move Metformin and Ditopan to 1900.

## 2020-03-14 NOTE — Progress Notes (Signed)
Cooperative with treatment , he denies suicidal ideation, he denies depression, he denies av/vh . He spent most of the evening in the dayroom with peers. He appears to be in bed resting quietly at this time.

## 2020-03-14 NOTE — [facility] Group Notes (Signed)
[place] Group Notes: (Clinical Social Work)   03/14/2020      Type of Therapy:  Group Therapy   Participation Level:  Did Not Attend - was invited individually by Nurse/MHT and chose not to attend. Patient stated he needed to eat his breakfast.    [name], LCSWA 03/14/2020  2:44 PM

## 2020-03-14 NOTE — Progress Notes (Signed)
Patient is asleep, because he did not sleep well last night. So, his morning medication will be administered once he wakes up.

## 2020-03-14 NOTE — Progress Notes (Signed)
[facility] MD Progress Note  03/14/2020 1:23 PM Alexander Beard  MRN:  [id number]  Principal Problem: Schizoaffective disorder, bipolar type ([place]) Diagnosis: Principal Problem:   Schizoaffective disorder, bipolar type ([place]) Active Problems:   Active autistic disorder   Diabetes ([place])   Hypertension   Prostate hypertrophy   Intellectual disability   At risk for elopement  Alexander Beard is a 64 y.o. male pt that has a previous psychiatric history of Schizoaffective disorder who presents to the [facility] unit for treatment of psychosis.  Interval History Patient was seen today for re-evaluation.  Nursing reports no events overnight. The patient reports no issues with performing ADLs.  Patient has been medication compliant.  The patient reports no side effects from medications.  Current symptoms being addressed include: psychosis.  Since last assessment, patient reports symptoms have improved marginally.    SUBJECTIVE: On assessment patient reports "I am very independent person. I can stay on my own" - reports he is upset we are trying to refer him to a group home. "I need a change of clothes". "I saw stains on the floor, I think they were blood stains. You cannot see them, I wiped them off. I don`t think I have any wounds on my body". "I think I am alright, I have no questions for you". Reports feeling well, denies feeling depressed, anxious; denies suicidal or homicidal thoughts, denies any hallucinations. Appears hypomanic - relocating items in his room, cleaning, taking showers, walking in his underware.   Review Of Systems: A complete review of systems of the following systems was conducted (Constitutional, Psychiatric, Neurological, Musculoskeletal, Eyes, Gastrointestinal, Cardiovascular, Respiratory, Skin, and Endocrine). All reviewed systems are negative except pertinent positives identified in the HPI.  Labs: glucose 122.  Last ECG was 8/2 - incomplete LBBB, QTc 43ms. - will repeat due to pt is on  medications that can possibly prolong QTc.   Total Time spent with patient: 15 minutes  Past Psychiatric History: see H&P   Past Medical History:  Past Medical History:  Diagnosis Date  . Anxiety   . Anxiety disorder due to known physiological condition    HOSPITALIZED 10/18  . Arthritis   . Autistic disorder, residual state   . COPD (chronic obstructive pulmonary disease) ([place])   . Depression   . Developmental disorder   . Diabetes mellitus without complication ([place])   . Dyslipidemia   . Esophageal reflux   . HOH (hard of hearing)    MILDLY  . Hypertension   . Obesity   . Overactive bladder   . Palpitations    ANXIETY  . Schizophrenia, schizoaffective ([place])   . Sleep apnea   . Tremors of nervous system    HANDS DUE TO MEDICATIONS  . Urinary incontinence     Past Surgical History:  Procedure Laterality Date  . CATARACT EXTRACTION W/PHACO Left 11/14/2017   Procedure: CATARACT EXTRACTION PHACO AND INTRAOCULAR LENS PLACEMENT (IOC);  Surgeon: Birder Robson, MD;  Location: [facility] ORS;  Service: Ophthalmology;  Laterality: Left;  [place] 00:42 AP% 14.6 CDE 6.12 Fluid pack lot # [id number]  . COLONOSCOPY  01/28/2005  . COLONOSCOPY WITH PROPOFOL N/A 05/09/2016   Procedure: COLONOSCOPY WITH PROPOFOL;  Surgeon: Manya Silvas, MD;  Location: [facility] ENDOSCOPY;  Service: Endoscopy;  Laterality: N/A;  . ESOPHAGOGASTRODUODENOSCOPY N/A 05/09/2016   Procedure: ESOPHAGOGASTRODUODENOSCOPY (EGD);  Surgeon: Manya Silvas, MD;  Location: [facility] ENDOSCOPY;  Service: Endoscopy;  Laterality: N/A;  . FRACTURE SURGERY     ORIF SHOULDER  . TONSILLECTOMY  Family History:  Family History  Problem Relation Age of Onset  . Hypertension Mother   . Stroke Father   . Heart Problems Father    Family Psychiatric  History: see H&P Social History   Substance and Sexual Activity  Alcohol Use No  . Alcohol/week: 0.0 standard drinks     Social History   Substance and Sexual Activity  Drug Use  No    Social History   Socioeconomic History  . Marital status: Single    Spouse name: Not on file  . Number of children: Not on file  . Years of education: Not on file  . Highest education level: Not on file  Occupational History  . Not on file  Tobacco Use  . Smoking status: Never Smoker  . Smokeless tobacco: Never Used  Vaping Use  . Vaping Use: Never used  Substance and Sexual Activity  . Alcohol use: No    Alcohol/week: 0.0 standard drinks  . Drug use: No  . Sexual activity: Never  Other Topics Concern  . Not on file  Social History Narrative   The patient never finished high school but did get his GED. 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 He is currently in disability and his brother Alexander Beard is his legal guardian. He has been living in group homes for many years.      No pending legal charges   Social Determinants of Health   Financial Resource Strain:   . Difficulty of Paying Living Expenses: Not on file  Food Insecurity:   . Worried About Charity fundraiser in the Last Year: Not on file  . Ran Out of Food in the Last Year: Not on file  Transportation Needs:   . Lack of Transportation (Medical): Not on file  . Lack of Transportation (Non-Medical): Not on file  Physical Activity:   . Days of Exercise per Week: Not on file  . Minutes of Exercise per Session: Not on file  Stress:   . Feeling of Stress : Not on file  Social Connections:   . Frequency of Communication with Friends and Family: Not on file  . Frequency of Social Gatherings with Friends and Family: Not on file  . Attends Religious Services: Not on file  . Active Member of Clubs or Organizations: Not on file  . Attends Archivist Meetings: Not on file  . Marital Status: Not on file   Additional Social History:                         Sleep: Fair  Appetite:  Fair  Current Medications: Current  Facility-Administered Medications  Medication Dose Route Frequency Provider Last Rate Last Admin  . acetaminophen (TYLENOL) tablet 650 mg  650 mg Oral Q6H PRN [name], MD      . alum & mag hydroxide-simeth (MAALOX/MYLANTA) 200-200-20 MG/5ML suspension 30 mL  30 mL Oral Q4H PRN [name], MD      . aspirin EC tablet 81 mg  81 mg Oral Daily [name], [name], MD   81 mg at 03/14/20 1252  . clonazePAM (KLONOPIN) tablet 1 mg  1 mg Oral QID [name], [name], MD   1 mg at 03/14/20 1253  . docusate sodium (COLACE) capsule 100 mg  100 mg Oral BID [name], [name], MD   100 mg at 03/14/20 1251  . gabapentin (NEURONTIN)  capsule 100 mg  100 mg Oral TID [name], [name], MD   100 mg at 03/14/20 1253  . hydrOXYzine (ATARAX/VISTARIL) tablet 10 mg  10 mg Oral TID PRN [name], MD   10 mg at 02/20/20 2127  . lamoTRIgine (LAMICTAL) tablet 100 mg  100 mg Oral QHS [name], [name], MD   100 mg at 03/13/20 2203  . linagliptin (TRADJENTA) tablet 5 mg  5 mg Oral Daily [name], [name], MD   5 mg at 03/14/20 1253  . loratadine (CLARITIN) tablet 10 mg  10 mg Oral Daily [name], [name], MD   10 mg at 03/14/20 1251  . magnesium hydroxide (MILK OF MAGNESIA) suspension 30 mL  30 mL Oral Daily PRN [name], MD   30 mL at 03/06/20 1228  . metFORMIN (GLUCOPHAGE) tablet 850 mg  850 mg Oral BID WC [name], [name], MD   850 mg at 03/14/20 1254  . metoprolol succinate (TOPROL-XL) 24 hr tablet 50 mg  50 mg Oral Daily [name], [name], MD   50 mg at 03/14/20 1254  . oxybutynin (DITROPAN) tablet 10 mg  10 mg Oral BID [name], [name], MD   10 mg at 03/14/20 1255  . pantoprazole (PROTONIX) EC tablet 40 mg  40 mg Oral Daily [name], [name], MD   40 mg at 03/14/20 1254  . prazosin (MINIPRESS) capsule 1 mg  1 mg Oral QHS [name], [name], MD   1 mg at 03/13/20 2203  . QUEtiapine (SEROQUEL) tablet 100 mg  100 mg Oral QID [name], [name], MD   100 mg at 03/14/20 1255  . QUEtiapine (SEROQUEL) tablet 400 mg  400 mg Oral  QHS [name], NP   400 mg at 03/13/20 2204  . sertraline (ZOLOFT) tablet 100 mg  100 mg Oral BID [name], [name], MD   100 mg at 03/14/20 1256  . simethicone (MYLICON) chewable tablet 80 mg  80 mg Oral TID [name], [name], MD   80 mg at 03/14/20 1255  . simvastatin (ZOCOR) tablet 20 mg  20 mg Oral q1800 [name], [name], MD   20 mg at 03/13/20 1729  . tamsulosin (FLOMAX) capsule 0.8 mg  0.8 mg Oral QPC supper [name], [name], MD   0.8 mg at 03/13/20 2297    Lab Results:  Results for orders placed or performed during the hospital encounter of 02/19/20 (from the past 48 hour(s))  Glucose, capillary     Status: Abnormal   Collection Time: 03/14/20 12:55 PM  Result Value Ref Range   Glucose-Capillary 122 (H) 70 - 99 mg/dL    Comment: Glucose reference range applies only to samples taken after fasting for at least 8 hours.    Blood Alcohol level:  Lab Results  Component Value Date   ETH <10 01/22/2020   ETH <10 98/92/1194    Metabolic Disorder Labs: Lab Results  Component Value Date   HGBA1C 6.6 (H) 02/20/2020   MPG 142.72 02/20/2020   No results found for: PROLACTIN Lab Results  Component Value Date   CHOL 153 04/10/2015   TRIG 166 (H) 04/10/2015   HDL 50 04/10/2015   CHOLHDL 3.1 04/10/2015   VLDL 33 04/10/2015   LDLCALC 70 04/10/2015    Physical Findings: AIMS:  , ,  ,  ,    CIWA:    COWS:     Musculoskeletal: Strength & Muscle Tone: within normal limits Gait & Station: normal Patient leans: N/A  Psychiatric Specialty Exam: Physical Exam  Review of Systems  Blood pressure 117/71, pulse 89, temperature 98.3 F (36.8 C), temperature source Oral, resp. rate 18, height 5\' 8"  (1.727 m), weight 104.3 kg, SpO2 95 %.Body mass index is 34.96 kg/m.  General Appearance: Bizarre  Eye Contact:  Fair  Speech:  Pressured  Volume:  Normal  Mood:  Euthymic  Affect:  Constricted  Thought Process:  Coherent  Orientation:  Full (Time, Place, and Person)  Thought  Content:  Illogical  Suicidal Thoughts:  No  Homicidal Thoughts:  No  Memory:  NA  Judgement:  Impaired  Insight:  Shallow  Psychomotor Activity:  Increased  Concentration:  Concentration: Poor and Attention Span: Poor  Recall:  [employer] of Knowledge:  Fair  Language:  Fair  Akathisia:  No  Handed:  Right  AIMS (if indicated):     Assets:  Communication Skills Desire for Improvement  ADL's:  Intact  Cognition:  Impaired,  Mild  Sleep:  Number of Hours: 4.5     Treatment Plan Summary: Daily contact with patient to assess and evaluate symptoms and progress in treatment and Medication management   Patient is a 64 year old male with the above-stated past psychiatric history who is seen in follow-up.  Chart reviewed. Patient discussed with nursing. Patient appears hypomanic, that is possibly his mental baseline. He is awaiting placement. Referrals sent by SW.  Last ECG was 8/2; will repeat today-tomorrow.   Plan:  -continue inpatient psych admission; 15-minute checks; daily contact with patient to assess and evaluate symptoms and progress in treatment; psychoeducation.  -continue scheduled psych medications: . aspirin EC  81 mg Oral Daily  . clonazePAM  1 mg Oral QID  . docusate sodium  100 mg Oral BID  . gabapentin  100 mg Oral TID  . lamoTRIgine  100 mg Oral QHS  . linagliptin  5 mg Oral Daily  . loratadine  10 mg Oral Daily  . metFORMIN  850 mg Oral BID WC  . metoprolol succinate  50 mg Oral Daily  . oxybutynin  10 mg Oral BID  . pantoprazole  40 mg Oral Daily  . prazosin  1 mg Oral QHS  . QUEtiapine  100 mg Oral QID  . QUEtiapine  400 mg Oral QHS  . sertraline  100 mg Oral BID  . simethicone  80 mg Oral TID  . simvastatin  20 mg Oral q1800  . tamsulosin  0.8 mg Oral QPC supper   -continue PRN medications. acetaminophen, alum & mag hydroxide-simeth, hydrOXYzine, magnesium hydroxide  -Disposition: to be determined. Likely d/c home with outpatient psych  follow-up in   days when patient is stabilized.  Larita Fife, MD 03/14/2020, 1:23 PM

## 2020-03-14 NOTE — Progress Notes (Signed)
Pt is currently just waking up. He states he would like to have his shower and get cleaned up before eating breakfast and taking his morning medications.

## 2020-03-14 NOTE — Progress Notes (Signed)
Patient extremely upset about his increasing episodes of incontinence. He used to only be incontinent at night but now he says he urinates on himself all through the day and feels as though he has to urinate all the time. He denies SI, HI and AVH. Is asking for a urology consult. Also is asking to talk to the manager of the group home that was thinking about taking him and explain to them his "wetting problem". He is very independent and will clean himself and change his linens himself with prompting and supplies provided to him by staff. He prides himself on his independence and is very upset about his incontinent episodes.

## 2020-03-14 NOTE — Progress Notes (Signed)
Pt has been cooperative with his treatment. He has verbally contracted for safety. Patient informed to notify staff with problems or concerns. He has remained safe. He did go outside in he courtyard with his peers. Support and encouragement provided. Routine safety checks conducted Q15 minutes.

## 2020-03-15 LAB — URINALYSIS, ROUTINE W REFLEX MICROSCOPIC
Bilirubin Urine: NEGATIVE
Glucose, UA: NEGATIVE mg/dL
Hgb urine dipstick: NEGATIVE
Ketones, ur: NEGATIVE mg/dL
Leukocytes,Ua: NEGATIVE
Nitrite: NEGATIVE
Protein, ur: NEGATIVE mg/dL
Specific Gravity, Urine: 1.013 (ref 1.005–1.030)
pH: 5 (ref 5.0–8.0)

## 2020-03-15 NOTE — [facility] Group Notes (Signed)
[place] LCSW Group Therapy Note  Date/Time:  03/15/2020 1:18 PM- 2:30 PM   Type of Therapy and Topic:  Group Therapy:  Healthy and Unhealthy Supports  Participation Level:  Minimal   Description of Group:  Patients in this group were introduced to the idea of adding a variety of healthy supports to address the various needs in their lives.Patients discussed what additional healthy supports could be helpful in their recovery and wellness after discharge in order to prevent future hospitalizations.   An emphasis was placed on using counselor, doctor, therapy groups, 12-step groups, and problem-specific support groups to expand supports.  They also worked as a group on developing a specific plan for several patients to deal with unhealthy supports through [place], psychoeducation with loved ones, and even termination of relationships.   Therapeutic Goals:   1)  discuss importance of adding supports to stay well once out of the hospital  2)  compare healthy versus unhealthy supports and identify some examples of each  3)  generate ideas and descriptions of healthy supports that can be added  4)  offer mutual support about how to address unhealthy supports  5)  encourage active participation in and adherence to discharge plan    Summary of Patient Progress:  Patient was upset during group and was concerned about RHA and peer support. Patient also was upset about going to the group home or not.   Therapeutic Modalities:   Motivational Interviewing Brief Solution-Focused Therapy  [name], Latanya Presser 03/15/2020  3:58 PM

## 2020-03-15 NOTE — Progress Notes (Addendum)
[facility] MD Progress Note  03/15/2020 9:22 AM Alexander Beard  MRN:  [id number]  Principal Problem: Schizoaffective disorder, bipolar type ([place]) Diagnosis: Principal Problem:   Schizoaffective disorder, bipolar type ([place]) Active Problems:   Active autistic disorder   Diabetes ([place])   Hypertension   Prostate hypertrophy   Intellectual disability   At risk for elopement  Alexander Beard is a 64 y.o. male pt that has a previous psychiatric history of Schizoaffective disorder who presents to the [facility] unit for treatment of psychosis.  Interval History Patient was seen today for re-evaluation.  Nursing reports no events overnight. "Patient extremely upset about his increasing episodes of incontinence. He is very independent and will clean himself and change his linens himself with prompting and supplies provided to him by staff. He prides himself on his independence and is very upset about his incontinent episodes."The patient reports no issues with performing ADLs.  Patient has been medication compliant.  The patient reports no side effects from medications.  Current symptoms being addressed include: psychosis.  Since last assessment, patient reports symptoms have improved marginally.    SUBJECTIVE: On assessment patient reports "I am upset due to wetting my bed", "I did not sleep well last night because of the loud patient", "I have no questions for you. I would like to see Dr Weber Cooks", "my mood is very good". Reports feeling well, denies feeling depressed, anxious; denies suicidal or homicidal thoughts, denies any hallucinations. His behavior I thought to be hypomanic yesterday, in fact is understandable today when I know he has increased incontinence, that is the reason for him to change clothes, linen and clean his room. The patient reports he does not want to discuss this issue with new "male doctor".  Review Of Systems: A complete review of systems of the following systems was conducted (Constitutional,  Psychiatric, Neurological, Musculoskeletal, Eyes, Gastrointestinal, Cardiovascular, Respiratory, Skin, and Endocrine). All reviewed systems are negative except pertinent positives identified in the HPI.  ECG repeated yesterday 9/4 -  NSR, QTc 353ms; no change from 8/2.  Total Time spent with patient: 15 minutes  Past Psychiatric History: see H&P   Past Medical History:  Past Medical History:  Diagnosis Date   Anxiety    Anxiety disorder due to known physiological condition    HOSPITALIZED 10/18   Arthritis    Autistic disorder, residual state    COPD (chronic obstructive pulmonary disease) ([place])    Depression    Developmental disorder    Diabetes mellitus without complication (HCC)    Dyslipidemia    Esophageal reflux    HOH (hard of hearing)    MILDLY   Hypertension    Obesity    Overactive bladder    Palpitations    ANXIETY   Schizophrenia, schizoaffective (HCC)    Sleep apnea    Tremors of nervous system    HANDS DUE TO MEDICATIONS   Urinary incontinence     Past Surgical History:  Procedure Laterality Date   CATARACT EXTRACTION W/PHACO Left 11/14/2017   Procedure: CATARACT EXTRACTION PHACO AND INTRAOCULAR LENS PLACEMENT ([place]);  Surgeon: Birder Robson, MD;  Location: [facility] ORS;  Service: Ophthalmology;  Laterality: Left;  [place] 00:42 AP% 14.6 CDE 6.12 Fluid pack lot # [id number]   COLONOSCOPY  01/28/2005   COLONOSCOPY WITH PROPOFOL N/A 05/09/2016   Procedure: COLONOSCOPY WITH PROPOFOL;  Surgeon: Manya Silvas, MD;  Location: [facility] ENDOSCOPY;  Service: Endoscopy;  Laterality: N/A;   ESOPHAGOGASTRODUODENOSCOPY N/A 05/09/2016   Procedure: ESOPHAGOGASTRODUODENOSCOPY (EGD);  Surgeon:  Manya Silvas, MD;  Location: [facility] ENDOSCOPY;  Service: Endoscopy;  Laterality: N/A;   FRACTURE SURGERY     ORIF SHOULDER   TONSILLECTOMY     Family History:  Family History  Problem Relation Age of Onset   Hypertension Mother    Stroke Father    Heart  Problems Father    Family Psychiatric  History: see H&P Social History   Substance and Sexual Activity  Alcohol Use No   Alcohol/week: 0.0 standard drinks     Social History   Substance and Sexual Activity  Drug Use No    Social History   Socioeconomic History   Marital status: Single    Spouse name: Not on file   Number of children: Not on file   Years of education: Not on file   Highest education level: Not on file  Occupational History   Not on file  Tobacco Use   Smoking status: Never Smoker   Smokeless tobacco: Never Used  Vaping Use   Vaping Use: Never used  Substance and Sexual Activity   Alcohol use: No    Alcohol/week: 0.0 standard drinks   Drug use: No   Sexual activity: Never  Other Topics Concern   Not on file  Social History Narrative   The patient never finished high school but did get his GED. 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 He is currently in disability and his brother Alexander Beard is his legal guardian. He has been living in group homes for many years.      No pending legal charges   Social Determinants of Health   Financial Resource Strain:    Difficulty of Paying Living Expenses: Not on file  Food Insecurity:    Worried About [place] in the Last Year: Not on file   Ran Out of Food in the Last Year: Not on file  Transportation Needs:    Lack of Transportation (Medical): Not on file   Lack of Transportation (Non-Medical): Not on file  Physical Activity:    Days of Exercise per Week: Not on file   Minutes of Exercise per Session: Not on file  Stress:    Feeling of Stress : Not on file  Social Connections:    Frequency of Communication with Friends and Family: Not on file   Frequency of Social Gatherings with Friends and Family: Not on file   Attends Religious Services: Not on file   Active Member of Clubs or Organizations: Not on file    Attends Archivist Meetings: Not on file   Marital Status: Not on file   Additional Social History:                         Sleep: Fair  Appetite:  Fair  Current Medications: Current Facility-Administered Medications  Medication Dose Route Frequency Provider Last Rate Last Admin   acetaminophen (TYLENOL) tablet 650 mg  650 mg Oral Q6H PRN [name], MD       alum & mag hydroxide-simeth (MAALOX/MYLANTA) 200-200-20 MG/5ML suspension 30 mL  30 mL Oral Q4H PRN [name], MD       aspirin EC tablet 81 mg  81 mg Oral Daily [name], [name], MD   81 mg at 03/15/20 0850   clonazePAM (KLONOPIN) tablet 1 mg  1 mg Oral QID [name], [name], MD   1 mg  at 03/15/20 0851   docusate sodium (COLACE) capsule 100 mg  100 mg Oral BID [name], [name], MD   100 mg at 03/15/20 8841   gabapentin (NEURONTIN) capsule 100 mg  100 mg Oral TID [name], [name], MD   100 mg at 03/15/20 0851   hydrOXYzine (ATARAX/VISTARIL) tablet 10 mg  10 mg Oral TID PRN [name], MD   10 mg at 02/20/20 2127   lamoTRIgine (LAMICTAL) tablet 100 mg  100 mg Oral QHS [name], [name], MD   100 mg at 03/14/20 2120   linagliptin (TRADJENTA) tablet 5 mg  5 mg Oral Daily [name], [name], MD   5 mg at 03/15/20 0850   loratadine (CLARITIN) tablet 10 mg  10 mg Oral Daily [name], [name], MD   10 mg at 03/15/20 0851   magnesium hydroxide (MILK OF MAGNESIA) suspension 30 mL  30 mL Oral Daily PRN [name], MD   30 mL at 03/06/20 1228   metFORMIN (GLUCOPHAGE) tablet 850 mg  850 mg Oral BID WC [name], [name], MD   850 mg at 03/15/20 0851   metoprolol succinate (TOPROL-XL) 24 hr tablet 50 mg  50 mg Oral Daily [name], [name], MD   50 mg at 03/15/20 0851   oxybutynin (DITROPAN) tablet 10 mg  10 mg Oral BID [name], [name], MD   10 mg at 03/15/20 0851   pantoprazole (PROTONIX) EC tablet 40 mg  40 mg Oral Daily [name], [name], MD   40 mg at 03/15/20 0850   prazosin (MINIPRESS) capsule 1 mg   1 mg Oral QHS [name], [name], MD   1 mg at 03/13/20 2203   QUEtiapine (SEROQUEL) tablet 100 mg  100 mg Oral QID [name], [name], MD   100 mg at 03/15/20 0850   QUEtiapine (SEROQUEL) tablet 400 mg  400 mg Oral QHS [name], NP   400 mg at 03/14/20 2120   sertraline (ZOLOFT) tablet 100 mg  100 mg Oral BID [name], [name], MD   100 mg at 03/15/20 0851   simethicone (MYLICON) chewable tablet 80 mg  80 mg Oral TID [name], [name], MD   80 mg at 03/15/20 0851   simvastatin (ZOCOR) tablet 20 mg  20 mg Oral q1800 [name], [name], MD   20 mg at 03/14/20 1728   tamsulosin (FLOMAX) capsule 0.8 mg  0.8 mg Oral QPC supper [name], [name], MD   0.8 mg at 03/14/20 1728    Lab Results:  Results for orders placed or performed during the hospital encounter of 02/19/20 (from the past 48 hour(s))  Glucose, capillary     Status: Abnormal   Collection Time: 03/14/20 12:55 PM  Result Value Ref Range   Glucose-Capillary 122 (H) 70 - 99 mg/dL    Comment: Glucose reference range applies only to samples taken after fasting for at least 8 hours.    Blood Alcohol level:  Lab Results  Component Value Date   ETH <10 01/22/2020   ETH <10 66/12/3014    Metabolic Disorder Labs: Lab Results  Component Value Date   HGBA1C 6.6 (H) 02/20/2020   MPG 142.72 02/20/2020   No results found for: PROLACTIN Lab Results  Component Value Date   CHOL 153 04/10/2015   TRIG 166 (H) 04/10/2015   HDL 50 04/10/2015   CHOLHDL 3.1 04/10/2015   VLDL 33 04/10/2015   LDLCALC 70 04/10/2015    Physical Findings: AIMS:  , ,  ,  ,  CIWA:    COWS:     Musculoskeletal: Strength & Muscle Tone: within normal limits Gait & Station: normal Patient leans: N/A  Psychiatric Specialty Exam: Physical Exam   Review of Systems   Blood pressure 116/68, pulse 67, temperature 98.2 F (36.8 C), temperature source Oral, resp. rate 14, height 5\' 8"  (1.727 m), weight 104.3 kg, SpO2 96 %.Body mass index is 34.96 kg/m.   General Appearance: appears older stated age, average hygiene  Eye Contact:  Fair  Speech: WNL  Volume:  Normal  Mood:  Euthymic  Affect:  Constricted  Thought Process:  Coherent, goal-directed  Orientation:  Full (Time, Place, and Person)  Thought Content:  logical  Suicidal Thoughts:  No  Homicidal Thoughts:  No  Memory:  NA  Judgement:  Fair  Insight:  Fair  Psychomotor Activity:  WNL  Concentration:  Concentration: Poor and Attention Span: Poor  Recall:  [employer] of Knowledge:  Fair  Language:  Fair  Akathisia:  No  Handed:  Right  AIMS (if indicated):     Assets:  Communication Skills Desire for Improvement  ADL's:  Intact  Cognition:  Impaired,  Mild  Sleep:  Number of Hours: 3.45     Treatment Plan Summary: Daily contact with patient to assess and evaluate symptoms and progress in treatment and Medication management   Patient is a 64 year old male with the above-stated past psychiatric history who is seen in follow-up.  Chart reviewed. Patient discussed with nursing. Patient appears without changes from yesterday, probably at his mental baseline. He is awaiting placement. Referrals sent by SW.  ECG repeated yesterday 9/4 -  NSR, QTc 316ms; no change from 8/2.  UA ordered today to r/o UTI due to worsened incontinence.   Plan:  -continue inpatient psych admission; 15-minute checks; daily contact with patient to assess and evaluate symptoms and progress in treatment; psychoeducation.  -continue scheduled psych medications:  aspirin EC  81 mg Oral Daily   clonazePAM  1 mg Oral QID   docusate sodium  100 mg Oral BID   gabapentin  100 mg Oral TID   lamoTRIgine  100 mg Oral QHS   linagliptin  5 mg Oral Daily   loratadine  10 mg Oral Daily   metFORMIN  850 mg Oral BID WC   metoprolol succinate  50 mg Oral Daily   oxybutynin  10 mg Oral BID   pantoprazole  40 mg Oral Daily   prazosin  1 mg Oral QHS   QUEtiapine  100 mg Oral QID   QUEtiapine   400 mg Oral QHS   sertraline  100 mg Oral BID   simethicone  80 mg Oral TID   simvastatin  20 mg Oral q1800   tamsulosin  0.8 mg Oral QPC supper   -continue PRN medications. acetaminophen, alum & mag hydroxide-simeth, hydrOXYzine, magnesium hydroxide   -UA ordered, pending.  -Disposition: to be determined. Likely d/c to a residential facility when the bed is open and patient accepted.  Larita Fife, MD 03/15/2020, 9:22 AM

## 2020-03-15 NOTE — Plan of Care (Signed)
D: Patient is alert and oriented. Pt was noted anxious, hands shaking and pressured speech, Atarax PRN given and was effective.   Patient rates depression 0/10, hopelessness 0/10, and anxiety 0/10. Patient stated goal : new group home, not over doing, rest and rest". Patient reports energy level as normal and concentration as good. Patient reports slept good last night. Patient did not receive medication for sleep.  Denies physical pain. Denies SI,HI, or AVH at this time.   A: Scheduled medications administered to patient per MD orders. Reassurance, support and encouragement provided. Verbally contracts for safety. Routine unit safety checks conducted Q 15 minutes.   R: Patient adhered to medication administration. No adverse drug reactions noted. Interacts well with others in milieu. Remains safe at this time, will continue to monitor.   Problem: Education: Goal: Emotional status will improve Outcome: Progressing Goal: Mental status will improve Outcome: Progressing Goal: Verbalization of understanding the information provided will improve Outcome: Progressing   Problem: Activity: Goal: Interest or engagement in activities will improve Outcome: Progressing Goal: Sleeping patterns will improve Outcome: Progressing   Problem: Coping: Goal: Ability to verbalize frustrations and anger appropriately will improve Outcome: Progressing

## 2020-03-16 NOTE — Progress Notes (Cosign Needed)
D- Patient alert and oriented. Affect/mood is worried, anxious and cooperative. Pt denies SI, HI, AVH, and pain. Pt went to groups.   A- Scheduled medications administered to patient, per MD orders. Support and encouragement provided.  Routine safety checks conducted every 15 minutes.  Patient informed to notify staff with problems or concerns.  R- No adverse drug reactions noted. Patient contracts for safety at this time. Patient compliant with medications and treatment plan. Patient receptive, calm, and cooperative. Patient interacts well with others on the unit.  Patient remains safe at this time.  Collier Bullock RN

## 2020-03-16 NOTE — BHH Counselor (Signed)
CSW spoke with patient in the hallway at the patient's request.  Patient asked for CSW to call the group home owner that patient had interviewed with the previous week. CSW pointed out that she has called the owner to follow up, however, call has not been returned or answered.  Patient then stated that he wanted to speak with the owner about continuing with [facility] and beign able to go out into the community. CSW pointed out that patient had discussed this with the group home owner during the interview.    Patient then stated that he became upset over the weekend with a CSW, however, named this CSW who was not present for the weekend.  CSW asked for clarification due to patient's confusing statement.  Patient then began to discuss how he was upset with this CSW for stating that the patient was in control of his own emotions and behaviors.  Patient stated that he is not.  CSW stated that everyone is control of themself, however, it can be difficult.  Patient became visibly upset and CSW attempted to end the conversation and CSW observed patient talking to a nurse.   After patient appeared to have calmed down and went to his room.  CSW went to check on patient before leaving the unit.  Patient became upset again when CSW asked patient why he was laying on a mattress with no sheets.  Pt was not aggressive though was speaking loudly.  CSW was asked to step away to allow nurse and patient to chat and pt to further deescalate.  CSW will follow up later.   Assunta Curtis, MSW, LCSW 03/16/2020 3:28 PM

## 2020-03-16 NOTE — BHH Counselor (Signed)
Clinical Social Work Note  CSW contacted the following for placement:  Mental Health Facilities/[place]:  [facility] [phone] - Staff who answered phone stated there is a 30-day notice that is running out on one resident in the next two weeks, so there will be a male bed available at that time.  [place] [phone] - Staff that answered phone stated patient is too old to be considered for this facility.  [place] [phone] - Staff that answered phone stated a male bed is coming available and asked that information be faxed to [phone].  Fax was sent, confirmed as successful.  [place] [phone]  - Staff that answered phone said no beds available at all.  [facility] [place] [phone] - HIPAA-compliant voicemail left asking for a call back if a male bed is available.  [place] [phone] - Number rang multiple times then a tone indicating fax link was received.  Could not leave a voicemail.  Selmer Dominion, LCSW 03/16/2020, 12:00 PM

## 2020-03-16 NOTE — Progress Notes (Signed)
Patient still appears anxious and worried. Says he is distressed because he gave the rec therapist two pictures to post in the craft room, and he says he did not see them. He is also worried about his brother because he says his brother needs his help because he has some medical problems. Denies SI, HI and AVH

## 2020-03-16 NOTE — BHH Counselor (Signed)
Clinical Social Work Note  CSW contacted the following for placement:  [place] [phone] - worker took weekday CSW's number and will give to the owner of the home to call if they have an opening.  [place] [phone] - left HIPAA-compliant voicemail with weekday CSW's number  [facility] [phone] - left HIPAA-compliant voicemail with weekday CSW's number  [place] [phone] -  left HIPAA-compliant voicemail with weekday CSW's number  [place] [facility] [phone] - Phone rang multiple times then went to a fax line, so no message could be left.  [place] [facility] [phone] - Phone rang multiple times then went to a voicemail box that said it was full, no message possible.  [facility] [phone] - Number disconnected  [facility] [place] [phone] - Wrong number  [facility] [phone] - Only have 1 male bed for IDD available currently  [facility] [place] [phone] - Staff who answered phone asked for FL-2 and Doctor's note to be faxed.  Fax was sent (to same number) at 10:58am, Fax was unsuccessful, was resent.    [facility] [phone] - Phone rang multiple times, went to voicemail, which is "not set up" so no message could be left  [place] [phone] -  left HIPAA-compliant voicemail with weekday CSW's number  [place] [phone] - Phone rang multiple times, went to voicemail, which is "not set up" so no message could be left. Owner Dellie Catholic called back, however, and gave the number of her administrator Earlean Polka [phone].  [place] [phone] - Spoke with Earlean Polka who stated she has a male bed open and was willing to look at the FL-2 and doctor's notes.  These were faxed to [phone].  Fax was confirmed to be successful at 11:14am.  [place] [facility] [phone] -  left  HIPAA-compliant voicemail with weekday CSW's number  [place] [phone] -  Phone rang multiple times, went to voicemail, which is "not set up" so no message could be left  [place] [facility] [phone] - left HIPAA-compliant voicemail with weekday CSW's number, staff at a sister facility stated this facility does have openings at this time, but is not sure if they are male or male.  [place] [phone] - Spoke with Jamison Neighbor who stated they have a male bed open currently, so FL-2 and doctor's note were faxed to [phone].    [facility] [phone] - Spoke with staff who stated they have no openings currently.      Selmer Dominion, LCSW 03/16/2020, 10:27 AM

## 2020-03-16 NOTE — Plan of Care (Signed)
  Problem: Education: Goal: Knowledge of [facility] Education information/materials will improve Outcome: Progressing Goal: Emotional status will improve Outcome: Progressing Goal: Mental status will improve Outcome: Progressing Goal: Verbalization of understanding the information provided will improve Outcome: Progressing   Problem: Activity: Goal: Interest or engagement in activities will improve Outcome: Progressing Goal: Sleeping patterns will improve Outcome: Progressing   

## 2020-03-16 NOTE — BHH Counselor (Signed)
CSW contacted the following in an effort to locate possible male or male beds to assist the patient in their placement search:   [place], [phone] Number wasn't identified as a working number.    [facility], [phone] Call could not be completed as dialed.    [place], [phone] Requested that the information be faxed to [phone], attention of [place]. CSW sent fax and received confirmation that the fax was successful.  [place], [phone] CSW called the line, however a noise like a fax machine started.   [facility] [phone] No male or male beds available.   [place], [phone] CSW was unable to leave HIPAA compliant voicemail.  [facility], [phone] CSW unable to leave message the mailbox was full.   [place], [phone] No beds available.  However provided the following contact information for additional group homes: Theron Arista [phone], Mr. [facility] Shark [phone] and Jari Pigg, [phone].  [facility] [phone] Ne beds available.   [facility]. [facility], [phone]  CSW left HIPAA compliant voicemail.  [facility], [phone] CSW left HIPAA compliant voicemail.  [place], [phone] Has 2 male beds available, requested that information be sent to [phone].    [place], [phone] No beds available.   [place], [phone] No beds available.   [facility], [phone] No beds available.   [address] and [place], [place] CSW left HIPAA compliant voicemail.  [facility], [phone] CSW left HIPAA compliant voicemail.  [facility], [phone] Employee is unsure if taking placements at the moment, though hom  es are available.  He will have the owner return this CSW's  call.  [place], [phone] CSW left HIPAA compliant voicemail.  [facility] [address] [phone] CSW left HIPAA compliant voicemail.  [facility], [phone] No beds available.   [place]. [phone] CSW left HIPAA compliant voicemail.  [place], [phone] CSW left HIPAA compliant voicemail.  [facility], [phone] Would need to speak with [facility], owner, [phone].  CSW called and was informed that male bed is available. Fax machine is broken however.   [facility], [phone] Number is not in service.  [facility], [phone] Line rang but ring became distorted.   [place] [facility], [phone] Asked that information be faxed to the attention of Dr. Theodora Blow [phone].  Male and male beds available.   [facility], [phone] CSW left HIPAA compliant voicemail.  [place], [phone] CSW left HIPAA compliant voicemail.  [facility], [phone] Number has been contacted before, CSW has sent the information.  [place], [phone] Line rang then began to make noise like a fax machine.  [facility], [place], [phone] Both male and male beds available, [phone], attention to [place].  [facility], [phone] CSW provided pt information on [facility]  [facility], [phone] No beds available.  [place], [phone] Line rang then had a noise like a fax machine.   [place], [phone] No open beds.  CSW notes that the following counties were looked into but do not have group homes: [place], [place] and [place].  CSW has previously contacted all group homes in [place].  Case Management Assistant has contacted homes in [facility] and is working on  contacting  homes in [place].  Assunta Curtis, MSW, LCSW 03/16/2020 3:56 PM

## 2020-03-16 NOTE — Plan of Care (Signed)
Pt denies depression, anxiety, SI, HI and AVH. Pt was educated on care plan and verbalizes understanding. Pt was encouraged to attends groups. Collier Bullock RN Problem: Education: Goal: Knowledge of [facility] [place] Education information/materials will improve Outcome: Progressing Goal: Emotional status will improve Outcome: Progressing Goal: Mental status will improve Outcome: Progressing Goal: Verbalization of understanding the information provided will improve Outcome: Progressing   Problem: Activity: Goal: Interest or engagement in activities will improve Outcome: Progressing Goal: Sleeping patterns will improve Outcome: Progressing   Problem: Coping: Goal: Ability to verbalize frustrations and anger appropriately will improve Outcome: Progressing Goal: Ability to demonstrate self-control will improve Outcome: Progressing   Problem: Health Behavior/Discharge Planning: Goal: Identification of resources available to assist in meeting health care needs will improve Outcome: Progressing Goal: Compliance with treatment plan for underlying cause of condition will improve Outcome: Progressing   Problem: Physical Regulation: Goal: Ability to maintain clinical measurements within normal limits will improve Outcome: Progressing   Problem: Safety: Goal: Periods of time without injury will increase Outcome: Progressing   Problem: Education: Goal: Ability to make informed decisions regarding treatment will improve Outcome: Progressing   Problem: Coping: Goal: Coping ability will improve Outcome: Progressing   Problem: Health Behavior/Discharge Planning: Goal: Identification of resources available to assist in meeting health care needs will improve Outcome: Progressing   Problem: Medication: Goal: Compliance with prescribed medication regimen will improve Outcome: Progressing   Problem: Self-Concept: Goal: Ability to disclose and discuss suicidal ideas will  improve Outcome: Progressing Goal: Will verbalize positive feelings about self Outcome: Progressing

## 2020-03-16 NOTE — [facility] Group Notes (Signed)
[facility] LCSW Group Therapy Note  Date/Time:  03/16/2020  1:00pm-2:15pm  Type of Therapy and Topic:  Group Therapy:  Discharge Obstacles  Participation Level:  Active   Description of Group: In this process group, patients were asked first to share what they want their recovery to look like when they leave the hospital.  Group members were then asked to share the obstacles they anticipate facing as they try to achieve their goals.  The group discussed these anticipated obstacles to wellness individually with CSW, did not really interact with each other.  Therapeutic Goals: 1. Patients will identify what their recovery goals are for post-hospital discharge 2. Patients will think about and acknowledge the obstacles they think they will face in trying to achieve their goals 3. Patients will be able to realize that they are not alone and others are actually facing similar obstacles 4. Patients will start to consider a plan for how to overcome the anticipated obstacles.  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  He was hyper focused on the things he used to do with his brother including staying the weekend at his home, going to the zoo and many other activities together.  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  He stated that if he could start doing those types of activities again, it would make all the difference in his depression.  He talked about his living situation at length and what caused him to no  longer be at his group home.  He stated he is worried about what happened with his interview with the "group home lady" and weekday Education officer, museum.  He spoke as if expecting to continue living in [place].   CSW informed him that we do not have word yet on that potential placement, but that regular weekday CSW and this CSW have spent the morning calling additional locations looking for a place for him to do at discharge.  He was particularly tearful about not being able to do activities with his brother [name] or even help his injured brother out.  Since his parents are both deceased, his brother is the only person he has left and he stated multiple times that they need to go back to "the way it was."  When CSW mentioned that it is normal to experience more losses as one gets older, but that does not make it easier to take, he became upset and shrill, saying it was "not normal at all."  Patient's participation in group was monopolizing in an extreme manner with little opportunity given to redirect him, affect was tearful and strained and speech was fairly well organized.  His engagement in the group was minimal, as he was only interested in engagement with CSW.  Each time CSW did cut him off in order to move on to other patients in group, he waited for the first opportunity to start talking again and would once again not give up control of the group.  After group he tried to  insist "There is more, much much more, that I need to tell you.  I need to talk to you when you're not busy."  CSW explained that there are always things to do and tried to get him to commit to a specific period of time he would require, but he insisted that he just needs a time to talk until he is done.  CSW told him this could not be done at this time.    Therapeutic Modalities: Processing Brief Solution Therapy   Selmer Dominion, LCSW

## 2020-03-16 NOTE — Progress Notes (Signed)
[facility] MD Progress Note  03/16/2020 2:18 PM Alexander Beard  MRN:  [id number] Subjective: Follow-up for this patient with schizoaffective disorder and developmental disability.  Patient seen chart reviewed.  Patient reports that overall he feels he is doing well emotionally.  Admits that he still has moments of anxiety but feels he has made progress in controlling them.  This is reflected in his behavior on the ward.  No recent disruptive behavior or agitation.  No signs of psychosis.  Patient continues to complain of urinary frequency and enuresis.  This is been addressed by urology and with medication so far with only limited success.  Mentally he is doing well.  Vital signs stable.  Last time we were checking blood sugars everything was quite stable. Principal Problem: Schizoaffective disorder, bipolar type ([place]) Diagnosis: Principal Problem:   Schizoaffective disorder, bipolar type ([place]) Active Problems:   Active autistic disorder   Diabetes ([place])   Hypertension   Prostate hypertrophy   Intellectual disability   At risk for elopement  Total Time spent with patient: 30 minutes  Past Psychiatric History: Patient has a lifelong history of disability from developmental disability and mental health issues  Past Medical History:  Past Medical History:  Diagnosis Date  . Anxiety   . Anxiety disorder due to known physiological condition    HOSPITALIZED 10/18  . Arthritis   . Autistic disorder, residual state   . COPD (chronic obstructive pulmonary disease) ([place])   . Depression   . Developmental disorder   . Diabetes mellitus without complication ([place])   . Dyslipidemia   . Esophageal reflux   . HOH (hard of hearing)    MILDLY  . Hypertension   . Obesity   . Overactive bladder   . Palpitations    ANXIETY  . Schizophrenia, schizoaffective ([place])   . Sleep apnea   . Tremors of nervous system    Beard DUE TO MEDICATIONS  . Urinary incontinence     Past Surgical History:  Procedure  Laterality Date  . CATARACT EXTRACTION W/PHACO Left 11/14/2017   Procedure: CATARACT EXTRACTION PHACO AND INTRAOCULAR LENS PLACEMENT (IOC);  Surgeon: Birder Robson, MD;  Location: [facility] ORS;  Service: Ophthalmology;  Laterality: Left;  [place] 00:42 AP% 14.6 CDE 6.12 Fluid pack lot # [id number]  . COLONOSCOPY  01/28/2005  . COLONOSCOPY WITH PROPOFOL N/A 05/09/2016   Procedure: COLONOSCOPY WITH PROPOFOL;  Surgeon: Manya Silvas, MD;  [facility] ENDOSCOPY;  Service: Endoscopy;  Laterality: N/A;  . ESOPHAGOGASTRODUODENOSCOPY N/A 05/09/2016   Procedure: ESOPHAGOGASTRODUODENOSCOPY (EGD);  Surgeon: Manya Silvas, MD;  Location: [facility] ENDOSCOPY;  Service: Endoscopy;  Laterality: N/A;  . FRACTURE SURGERY     ORIF SHOULDER  . TONSILLECTOMY     Family History:  Family History  Problem Relation Age of Onset  . Hypertension Mother   . Stroke Father   . Heart Problems Father    Family Psychiatric  History: See previous.  Nothing specific identified. Social History:  Social History   Substance and Sexual Activity  Alcohol Use No  . Alcohol/week: 0.0 standard drinks     Social History   Substance and Sexual Activity  Drug Use No    Social History   Socioeconomic History  . Marital status: Single    Spouse name: Not on file  . Number of children: Not on file  . Years of education: Not on file  . Highest education level: Not on file  Occupational History  . Not on file  Tobacco Use  . Smoking status: Never Smoker  . Smokeless tobacco: Never Used  Vaping Use  . Vaping Use: Never used  Substance and Sexual Activity  . Alcohol use: No    Alcohol/week: 0.0 standard drinks  . Drug use: No  . Sexual activity: Never  Other Topics Concern  . Not on file  Social History Narrative   The patient never finished high school but did get his GED. 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 He is currently in disability and his  brother [name] is his legal guardian. He has been living in group homes for many years.      No pending legal charges   Social Determinants of Health   Financial Resource Strain:   . Difficulty of Paying Living Expenses: Not on file  Food Insecurity:   . Worried About Charity fundraiser in the Last Year: Not on file  . Ran Out of Food in the Last Year: Not on file  Transportation Needs:   . Lack of Transportation (Medical): Not on file  . Lack of Transportation (Non-Medical): Not on file  Physical Activity:   . Days of Exercise per Week: Not on file  . Minutes of Exercise per Session: Not on file  Stress:   . Feeling of Stress : Not on file  Social Connections:   . Frequency of Communication with Friends and Family: Not on file  . Frequency of Social Gatherings with Friends and Family: Not on file  . Attends Religious Services: Not on file  . Active Member of Clubs or Organizations: Not on file  . Attends Archivist Meetings: Not on file  . Marital Status: Not on file   Additional Social History:                         Sleep: Fair  Appetite:  Fair  Current Medications: Current Facility-Administered Medications  Medication Dose Route Frequency Provider Last Rate Last Admin  . acetaminophen (TYLENOL) tablet 650 mg  650 mg Oral Q6H PRN [name], MD      . alum & mag hydroxide-simeth (MAALOX/MYLANTA) 200-200-20 MG/5ML suspension 30 mL  30 mL Oral Q4H PRN [name], MD      . aspirin EC tablet 81 mg  81 mg Oral Daily Pierina Schuknecht, [name], MD   81 mg at 03/16/20 0820  . clonazePAM (KLONOPIN) tablet 1 mg  1 mg Oral QID Agapito Hanway T, MD   1 mg at 03/16/20 1120  . docusate sodium (COLACE) capsule 100 mg  100 mg Oral BID Maxen Rowland, [name], MD   100 mg at 03/16/20 0816  . gabapentin (NEURONTIN) capsule 100 mg  100 mg Oral TID Aryana Wonnacott T, MD   100 mg at 03/16/20 1120  . hydrOXYzine (ATARAX/VISTARIL) tablet 10 mg  10 mg Oral TID PRN [name], MD   10 mg at 03/15/20 1004  . lamoTRIgine (LAMICTAL) tablet 100 mg  100 mg Oral QHS Noel Henandez T, MD   100 mg at 03/15/20 2110  . linagliptin (TRADJENTA) tablet 5 mg  5 mg Oral Daily Ardean Melroy, [name], MD   5 mg at 03/16/20 0813  . loratadine (CLARITIN) tablet 10 mg  10 mg Oral Daily Dorean Daniello, [name], MD   10 mg at 03/16/20 0816  . magnesium hydroxide (MILK OF MAGNESIA) suspension 30 mL  30 mL  Oral Daily PRN [name], MD   30 mL at 03/16/20 1119  . metFORMIN (GLUCOPHAGE) tablet 850 mg  850 mg Oral BID WC Ulani Degrasse, [name], MD   850 mg at 03/16/20 0816  . metoprolol succinate (TOPROL-XL) 24 hr tablet 50 mg  50 mg Oral Daily Chrishun Scheer, [name], MD   50 mg at 03/15/20 0851  . oxybutynin (DITROPAN) tablet 10 mg  10 mg Oral BID Jameya Pontiff, [name], MD   10 mg at 03/16/20 0817  . pantoprazole (PROTONIX) EC tablet 40 mg  40 mg Oral Daily Jeanne Terrance, [name], MD   40 mg at 03/16/20 0815  . prazosin (MINIPRESS) capsule 1 mg  1 mg Oral QHS Zakiyah Diop T, MD   1 mg at 03/15/20 2109  . QUEtiapine (SEROQUEL) tablet 100 mg  100 mg Oral QID Lawyer Washabaugh T, MD   100 mg at 03/16/20 1120  . QUEtiapine (SEROQUEL) tablet 400 mg  400 mg Oral QHS [name], NP   400 mg at 03/15/20 2110  . sertraline (ZOLOFT) tablet 100 mg  100 mg Oral BID Roselia Snipe, [name], MD   100 mg at 03/16/20 0814  . simethicone (MYLICON) chewable tablet 80 mg  80 mg Oral TID Prakriti Carignan, [name], MD   80 mg at 03/16/20 1121  . simvastatin (ZOCOR) tablet 20 mg  20 mg Oral q1800 Emelda Kohlbeck, [name], MD   20 mg at 03/15/20 1732  . tamsulosin (FLOMAX) capsule 0.8 mg  0.8 mg Oral QPC supper Tarvaris Puglia, [name], MD   0.8 mg at 03/15/20 1733    Lab Results:  Results for orders placed or performed during the hospital encounter of 02/19/20 (from the past 48 hour(s))  Urinalysis, Routine w reflex microscopic     Status: Abnormal   Collection Time: 03/15/20 10:50 AM  Result Value Ref Range   Color, Urine YELLOW (A) YELLOW   APPearance CLEAR (A)  CLEAR   Specific Gravity, Urine 1.013 1.005 - 1.030   pH 5.0 5.0 - 8.0   Glucose, UA NEGATIVE NEGATIVE mg/dL   Hgb urine dipstick NEGATIVE NEGATIVE   Bilirubin Urine NEGATIVE NEGATIVE   Ketones, ur NEGATIVE NEGATIVE mg/dL   Protein, ur NEGATIVE NEGATIVE mg/dL   Nitrite NEGATIVE NEGATIVE   Leukocytes,Ua NEGATIVE NEGATIVE    Comment: Performed at [facility], [address] [place], [place] [address]    Blood Alcohol level:  Lab Results  Component Value Date   [facility] <10 01/22/2020   ETH <10 60/45/4098    Metabolic Disorder Labs: Lab Results  Component Value Date   HGBA1C 6.6 (H) 02/20/2020   MPG 142.72 02/20/2020   No results found for: PROLACTIN Lab Results  Component Value Date   CHOL 153 04/10/2015   TRIG 166 (H) 04/10/2015   HDL 50 04/10/2015   CHOLHDL 3.1 04/10/2015   VLDL 33 04/10/2015   LDLCALC 70 04/10/2015    Physical Findings: AIMS:  , ,  ,  ,    CIWA:    COWS:     Musculoskeletal: Strength & Muscle Tone: within normal limits Gait & Station: normal Patient leans: N/A  Psychiatric Specialty Exam: Physical Exam Vitals and nursing note reviewed.  Constitutional:      Appearance: He is well-developed.  HENT:     Head: Normocephalic and atraumatic.  Eyes:     Conjunctiva/sclera: Conjunctivae normal.     Pupils: Pupils are equal, round, and reactive to light.  Cardiovascular:     Heart  sounds: Normal heart sounds.  Pulmonary:     Effort: Pulmonary effort is normal.  Abdominal:     Palpations: Abdomen is soft.  Musculoskeletal:        General: Normal range of motion.     Cervical back: Normal range of motion.  Skin:    General: Skin is warm and dry.  Neurological:     General: No focal deficit present.     Mental Status: He is alert.  Psychiatric:        Attention and Perception: Attention normal.        Mood and Affect: Mood normal.        Speech: Speech normal.        Behavior: Behavior is agitated. Behavior is not aggressive or  hyperactive.        Thought Content: Thought content is not paranoid or delusional. Thought content does not include homicidal or suicidal ideation.        Cognition and Memory: Cognition is impaired.        Judgment: Judgment is impulsive.     Review of Systems  Constitutional: Negative.   HENT: Negative.   Eyes: Negative.   Respiratory: Negative.   Cardiovascular: Negative.   Gastrointestinal: Negative.   Genitourinary: Positive for enuresis and frequency.  Musculoskeletal: Negative.   Skin: Negative.   Neurological: Negative.   Psychiatric/Behavioral: Negative.     Blood pressure 107/72, pulse (!) 58, temperature 98.2 F (36.8 C), temperature source Oral, resp. rate 18, height 5\' 8"  (1.727 m), weight 104.3 kg, SpO2 96 %.Body mass index is 34.96 kg/m.  General Appearance: Casual  Eye Contact:  Fair  Speech:  Clear and Coherent  Volume:  Decreased  Mood:  Anxious  Affect:  Appropriate  Thought Process:  Coherent  Orientation:  Full (Time, Place, and Person)  Thought Content:  Logical and Tangential  Suicidal Thoughts:  No  Homicidal Thoughts:  No  Memory:  Immediate;   Fair Recent;   Fair Remote;   Fair  Judgement:  Impaired  Insight:  Shallow  Psychomotor Activity:  Restlessness and Tremor  Concentration:  Concentration: Fair  Recall:  [employer] of Knowledge:  Fair  Language:  Fair  Akathisia:  No  Handed:  Right  AIMS (if indicated):     Assets:  Desire for Improvement Resilience  ADL's:  Impaired  Cognition:  Impaired,  Mild  Sleep:  Number of Hours: 6     Treatment Plan Summary: Daily contact with patient to assess and evaluate symptoms and progress in treatment, Medication management and Plan From a psychiatric standpoint Rehman is doing quite well.  He gets along well with others and is not displaying any psychosis or agitation.  He is controlling his behavior despite stresses to his mood.  Tolerating medicine well.  He has something of a chronic tremor  which may in part be related to longstanding exposure to medication but it is stable.  He is able to eat, draw, and do his daily activities.  Blood pressure and medical conditions stable except for his ongoing enuresis which is being managed with frequent attention to making sure he is using the restroom and pads to prevent accidents at other times.  We continue to hope to find discharge plan soon  Alethia Berthold, MD 03/16/2020, 2:18 PM

## 2020-03-17 NOTE — Progress Notes (Signed)
   03/17/20 1400  Clinical Encounter Type  Visited With Patient  Visit Type Follow-up;Spiritual support;Social support;Behavioral Health  Referral From Chaplain  Consult/Referral To Chaplain  Pt attended Ch group today. The subject today was Life. How is life treating you. We had some great discussions and Pt participated a lot. Ch will follow-up with Pt.

## 2020-03-17 NOTE — Progress Notes (Signed)
[facility] MD Progress Note  03/17/2020 2:13 PM Alexander Beard  MRN:  [id number] Subjective: Follow-up for this 64 year old man with schizoaffective disorder.  Patient today was feeling anxious because it was his perception that he had been in a disagreement or argument with social work over the weekend.  I think that he was exaggerating the problem and I gave him a lot of validation and support for the work he is doing in trying to control his behavior.  He has had no new behavior problems.  No psychotic symptoms.  Staff agree that his behavior and self-control have progressed quite a bit here in the hospital.  He does continue to have frequent enuresis.  He tells me that often it is triggered right after having consumed some kind of beverage. Principal Problem: Schizoaffective disorder, bipolar type ([place]) Diagnosis: Principal Problem:   Schizoaffective disorder, bipolar type ([place]) Active Problems:   Active autistic disorder   Diabetes ([place])   Hypertension   Prostate hypertrophy   Intellectual disability   At risk for elopement  Total Time spent with patient: 30 minutes  Past Psychiatric History: Long history of chronic disability  Past Medical History:  Past Medical History:  Diagnosis Date  . Anxiety   . Anxiety disorder due to known physiological condition    HOSPITALIZED 10/18  . Arthritis   . Autistic disorder, residual state   . COPD (chronic obstructive pulmonary disease) ([place])   . Depression   . Developmental disorder   . Diabetes mellitus without complication ([place])   . Dyslipidemia   . Esophageal reflux   . HOH (hard of hearing)    MILDLY  . Hypertension   . Obesity   . Overactive bladder   . Palpitations    ANXIETY  . Schizophrenia, schizoaffective ([name])   . Sleep apnea   . Tremors of nervous system    HANDS DUE TO MEDICATIONS  . Urinary incontinence     Past Surgical History:  Procedure Laterality Date  . CATARACT EXTRACTION W/PHACO Left 11/14/2017   Procedure:  CATARACT EXTRACTION PHACO AND INTRAOCULAR LENS PLACEMENT (IOC);  Surgeon: Birder Robson, MD;  Location: [facility] ORS;  Service: Ophthalmology;  Laterality: Left;  [place] 00:42 AP% 14.6 CDE 6.12 Fluid pack lot # [id number]  . COLONOSCOPY  01/28/2005  . COLONOSCOPY WITH PROPOFOL N/A 05/09/2016   Procedure: COLONOSCOPY WITH PROPOFOL;  Surgeon: Manya Silvas, MD;  Location: [facility] ENDOSCOPY;  Service: Endoscopy;  Laterality: N/A;  . ESOPHAGOGASTRODUODENOSCOPY N/A 05/09/2016   Procedure: ESOPHAGOGASTRODUODENOSCOPY (EGD);  Surgeon: Manya Silvas, MD;  Location: [facility] ENDOSCOPY;  Service: Endoscopy;  Laterality: N/A;  . FRACTURE SURGERY     ORIF SHOULDER  . TONSILLECTOMY     Family History:  Family History  Problem Relation Age of Onset  . Hypertension Mother   . Stroke Father   . Heart Problems Father    Family Psychiatric  History: See previous Social History:  Social History   Substance and Sexual Activity  Alcohol Use No  . Alcohol/week: 0.0 standard drinks     Social History   Substance and Sexual Activity  Drug Use No    Social History   Socioeconomic History  . Marital status: Single    Spouse name: Not on file  . Number of children: Not on file  . Years of education: Not on file  . Highest education level: Not on file  Occupational History  . Not on file  Tobacco Use  . Smoking status: Never Smoker  .  Smokeless tobacco: Never Used  Vaping Use  . Vaping Use: Never used  Substance and Sexual Activity  . Alcohol use: No    Alcohol/week: 0.0 standard drinks  . Drug use: No  . Sexual activity: Never  Other Topics Concern  . Not on file  Social History Narrative   The patient never finished high school but did get his GED. 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 He is currently in disability and [name] is his legal guardian. He has been living in group homes for many years.      No pending legal  charges   Social Determinants of Health   Financial Resource Strain:   . Difficulty of Paying Living Expenses: Not on file  Food Insecurity:   . Worried About Charity fundraiser in the Last Year: Not on file  . Ran Out of Food in the Last Year: Not on file  Transportation Needs:   . Lack of Transportation (Medical): Not on file  . Lack of Transportation (Non-Medical): Not on file  Physical Activity:   . Days of Exercise per Week: Not on file  . Minutes of Exercise per Session: Not on file  Stress:   . Feeling of Stress : Not on file  Social Connections:   . Frequency of Communication with Friends and Family: Not on file  . Frequency of Social Gatherings with Friends and Family: Not on file  . Attends Religious Services: Not on file  . Active Member of Clubs or Organizations: Not on file  . Attends Archivist Meetings: Not on file  . Marital Status: Not on file   Additional Social History:                         Sleep: Fair  Appetite:  Fair  Current Medications: Current Facility-Administered Medications  Medication Dose Route Frequency Provider Last Rate Last Admin  . acetaminophen (TYLENOL) tablet 650 mg  650 mg Oral Q6H PRN [name], MD      . alum & mag hydroxide-simeth (MAALOX/MYLANTA) 200-200-20 MG/5ML suspension 30 mL  30 mL Oral Q4H PRN [name], MD      . aspirin EC tablet 81 mg  81 mg Oral Daily Yavuz Kirby, [name], MD   81 mg at 03/17/20 0925  . clonazePAM (KLONOPIN) tablet 1 mg  1 mg Oral QID Aashna Matson, [name], MD   1 mg at 03/17/20 1241  . docusate sodium (COLACE) capsule 100 mg  100 mg Oral BID Mesha Schamberger, [name], MD   100 mg at 03/17/20 0923  . gabapentin (NEURONTIN) capsule 100 mg  100 mg Oral TID Liborio Saccente T, MD   100 mg at 03/17/20 1241  . hydrOXYzine (ATARAX/VISTARIL) tablet 10 mg  10 mg Oral TID PRN [name], MD   10 mg at 03/16/20 1437  . lamoTRIgine (LAMICTAL) tablet 100 mg  100 mg Oral QHS Alexias Margerum T, MD    100 mg at 03/16/20 2143  . linagliptin (TRADJENTA) tablet 5 mg  5 mg Oral Daily Guillermo Difrancesco, [name], MD   5 mg at 03/17/20 0925  . loratadine (CLARITIN) tablet 10 mg  10 mg Oral Daily Caralynn Gelber, [name], MD   10 mg at 03/17/20 0924  . magnesium hydroxide (MILK OF MAGNESIA) suspension 30 mL  30 mL Oral Daily PRN [name], MD   30 mL  at 03/16/20 1119  . metFORMIN (GLUCOPHAGE) tablet 850 mg  850 mg Oral BID WC Briahna Pescador, [name], MD   850 mg at 03/17/20 0924  . metoprolol succinate (TOPROL-XL) 24 hr tablet 50 mg  50 mg Oral Daily Caylor Tallarico, [name], MD   50 mg at 03/17/20 0923  . oxybutynin (DITROPAN) tablet 10 mg  10 mg Oral BID Jedrick Hutcherson, [name], MD   10 mg at 03/17/20 0924  . pantoprazole (PROTONIX) EC tablet 40 mg  40 mg Oral Daily Adriel Desrosier, [name], MD   40 mg at 03/17/20 0925  . prazosin (MINIPRESS) capsule 1 mg  1 mg Oral QHS Shaylin Blatt T, MD   1 mg at 03/15/20 2109  . QUEtiapine (SEROQUEL) tablet 100 mg  100 mg Oral QID Shaneen Reeser T, MD   100 mg at 03/17/20 1242  . QUEtiapine (SEROQUEL) tablet 400 mg  400 mg Oral QHS [name], NP   400 mg at 03/16/20 2142  . sertraline (ZOLOFT) tablet 100 mg  100 mg Oral BID Leighton Brickley, [name], MD   100 mg at 03/17/20 0923  . simethicone (MYLICON) chewable tablet 80 mg  80 mg Oral TID Ellery Tash, [name], MD   80 mg at 03/17/20 1241  . simvastatin (ZOCOR) tablet 20 mg  20 mg Oral q1800 Varvara Legault, [name], MD   20 mg at 03/16/20 1759  . tamsulosin (FLOMAX) capsule 0.8 mg  0.8 mg Oral QPC supper Rigel Filsinger, [name], MD   0.8 mg at 03/16/20 1802    Lab Results: No results found for this or any previous visit (from the past 48 hour(s)).  Blood Alcohol level:  Lab Results  Component Value Date   ETH <10 01/22/2020   ETH <10 41/66/0630    Metabolic Disorder Labs: Lab Results  Component Value Date   HGBA1C 6.6 (H) 02/20/2020   MPG 142.72 02/20/2020   No results found for: PROLACTIN Lab Results  Component Value Date   CHOL 153 04/10/2015   TRIG 166 (H)  04/10/2015   HDL 50 04/10/2015   CHOLHDL 3.1 04/10/2015   VLDL 33 04/10/2015   LDLCALC 70 04/10/2015    Physical Findings: AIMS:  , ,  ,  ,    CIWA:    COWS:     Musculoskeletal: Strength & Muscle Tone: within normal limits Gait & Station: normal Patient leans: N/A  Psychiatric Specialty Exam: Physical Exam Vitals and nursing note reviewed.  Constitutional:      Appearance: He is well-developed.  HENT:     Head: Normocephalic and atraumatic.  Eyes:     Conjunctiva/sclera: Conjunctivae normal.     Pupils: Pupils are equal, round, and reactive to light.  Cardiovascular:     Heart sounds: Normal heart sounds.  Pulmonary:     Effort: Pulmonary effort is normal.  Abdominal:     Palpations: Abdomen is soft.  Musculoskeletal:        General: Normal range of motion.     Cervical back: Normal range of motion.  Skin:    General: Skin is warm and dry.  Neurological:     General: No focal deficit present.     Mental Status: He is alert.  Psychiatric:        Attention and Perception: Attention normal.        Mood and Affect: Mood is anxious.        Speech: Speech is tangential.        Behavior: Behavior is agitated.  Behavior is not aggressive.        Thought Content: Thought content is not paranoid or delusional. Thought content does not include homicidal or suicidal ideation.        Cognition and Memory: Cognition is impaired. Memory is impaired.        Judgment: Judgment is impulsive.     Review of Systems  Constitutional: Negative.   HENT: Negative.   Eyes: Negative.   Respiratory: Negative.   Cardiovascular: Negative.   Gastrointestinal: Negative.   Musculoskeletal: Negative.   Skin: Negative.   Neurological: Negative.   Psychiatric/Behavioral: Negative for self-injury, sleep disturbance and suicidal ideas. The patient is nervous/anxious.     Blood pressure (!) 148/93, pulse (!) 52, temperature 98.8 F (37.1 C), temperature source Oral, resp. rate 17, height 5'  8" (1.727 m), weight 104.3 kg, SpO2 92 %.Body mass index is 34.96 kg/m.  General Appearance: Casual  Eye Contact:  Fair  Speech:  Clear and Coherent  Volume:  Normal  Mood:  Euthymic  Affect:  Congruent  Thought Process:  Coherent  Orientation:  Full (Time, Place, and Person)  Thought Content:  Rumination  Suicidal Thoughts:  No  Homicidal Thoughts:  No  Memory:  Immediate;   Fair Recent;   Fair Remote;   Fair  Judgement:  Impaired  Insight:  Shallow  Psychomotor Activity:  Normal  Concentration:  Concentration: Fair  Recall:  [employer] of Knowledge:  Fair  Language:  Fair  Akathisia:  No  Handed:  Right  AIMS (if indicated):     Assets:  Desire for Improvement Resilience Social Support  ADL's:  Impaired  Cognition:  Impaired,  Mild  Sleep:  Number of Hours: 6.5     Treatment Plan Summary: Daily contact with patient to assess and evaluate symptoms and progress in treatment, Medication management and Plan Patient with chronic and progressive cognitive impairment and chronic behavior problems.  Stable here in the hospital.  Enuresis is still a problem but we are working on controlling it with behavior since he is already on quite a bit of medication.  Continue to try to work on some kind of placement plan  Alethia Berthold, MD 03/17/2020, 2:13 PM

## 2020-03-17 NOTE — Progress Notes (Addendum)
D- Patient alert and oriented. Affect/mood is pleasant, content, cooperative and calm. Pt denies SI, HI, AVH, and pain. Pt has enjoyed outdoors, basketball , art and socializing. I encouraged patient to change clothes and take a shower but that has not taken place yet.   A- Scheduled medications administered to patient, per MD orders. Support and encouragement provided.  Routine safety checks conducted every 15 minutes.  Patient informed to notify staff with problems or concerns.  R- No adverse drug reactions noted. Patient contracts for safety at this time. Patient compliant with medications and treatment plan. Patient receptive, calm, and cooperative. Patient interacts well with others on the unit.  Patient remains safe at this time.  Collier Bullock RN

## 2020-03-17 NOTE — NC FL2 (Signed)
[place] LEVEL OF CARE SCREENING TOOL     IDENTIFICATION  Patient Name: Alexander Beard Birthdate: 1956/04/08 Sex: male Admission Date (Current Location): 02/19/2020  [facility] and [place] Number:  Engineering geologist and Address:  [facility], [address] [place],  [address]      Provider Number: [id number]  Attending Physician Name and Address:  [name], [name], MD  Relative Name and Phone Number:       Current Level of Care: Hospital Recommended Level of Care: [place], Other (Comment) (Group Home) Prior Approval Number:    Date Approved/Denied:   PASRR Number:    Discharge Plan: Other (Comment) (Group Home, Family Care Home,)    Current Diagnoses: Patient Active Problem List   Diagnosis Date Noted  . Intellectual disability 03/06/2020  . At risk for elopement 03/06/2020  . Diabetes ([place]) 04/10/2015  . Hypertension 04/10/2015  . Prostate hypertrophy 04/10/2015  . Schizoaffective disorder ([place]) 04/10/2015  . Enlarged prostate 04/10/2015  . Essential (primary) hypertension 04/10/2015  . Type 2 diabetes mellitus ([place]) 04/10/2015  . Controlled type 2 diabetes mellitus without complication ([place]) 07/62/2633  . Schizoaffective disorder, bipolar type ([place]) 12/29/2014  . Active autistic disorder 12/29/2014    Orientation RESPIRATION BLADDER Height & Weight     Self, Situation, Place, Time  Normal Incontinent Weight: 229 lb 15 oz (104.3 kg) Height:  5\' 8"  (172.7 cm)  BEHAVIORAL SYMPTOMS/MOOD NEUROLOGICAL BOWEL NUTRITION STATUS  Wanderer, Verbally abusive  (NA) Continent Diet (Carb Modified diet due to diabetes.)  AMBULATORY STATUS COMMUNICATION OF NEEDS Skin   Independent Verbally Normal                       Personal Care Assistance Level of Assistance   (NA)           Functional Limitations Info   (NA)          SPECIAL CARE FACTORS FREQUENCY  Blood pressure (Diabetic, blood sugars  maintained)                    Contractures Contractures Info: Not present    Additional Factors Info  Allergies, Code Status Code Status Info: Full Allergies Info: Penicillins, Cortizone-10, "myacins"           Current Medications (03/17/2020):  This is the current hospital active medication list Current Facility-Administered Medications  Medication Dose Route Frequency Provider Last Rate Last Admin  . acetaminophen (TYLENOL) tablet 650 mg  650 mg Oral Q6H PRN [name], MD      . alum & mag hydroxide-simeth (MAALOX/MYLANTA) 200-200-20 MG/5ML suspension 30 mL  30 mL Oral Q4H PRN [name], MD      . aspirin EC tablet 81 mg  81 mg Oral Daily [name], [name], MD   81 mg at 03/17/20 0925  . clonazePAM (KLONOPIN) tablet 1 mg  1 mg Oral QID [name], [name], MD   1 mg at 03/17/20 3545  . docusate sodium (COLACE) capsule 100 mg  100 mg Oral BID [name], [name], MD   100 mg at 03/17/20 0923  . gabapentin (NEURONTIN) capsule 100 mg  100 mg Oral TID [name], [name], MD   100 mg at 03/17/20 0923  . hydrOXYzine (ATARAX/VISTARIL) tablet 10 mg  10 mg Oral TID PRN [name], MD   10 mg at 03/16/20 1437  . lamoTRIgine (LAMICTAL) tablet 100 mg  100 mg Oral QHS [name],  [name], MD   100 mg at 03/16/20 2143  . linagliptin (TRADJENTA) tablet 5 mg  5 mg Oral Daily [name], [name], MD   5 mg at 03/17/20 0925  . loratadine (CLARITIN) tablet 10 mg  10 mg Oral Daily [name], [name], MD   10 mg at 03/17/20 0924  . magnesium hydroxide (MILK OF MAGNESIA) suspension 30 mL  30 mL Oral Daily PRN [name], MD   30 mL at 03/16/20 1119  . metFORMIN (GLUCOPHAGE) tablet 850 mg  850 mg Oral BID WC [name], [name], MD   850 mg at 03/17/20 0924  . metoprolol succinate (TOPROL-XL) 24 hr tablet 50 mg  50 mg Oral Daily [name], [name], MD   50 mg at 03/17/20 0923  . oxybutynin (DITROPAN) tablet 10 mg  10 mg Oral BID [name], [name], MD   10 mg at 03/17/20 0924  . pantoprazole (PROTONIX) EC  tablet 40 mg  40 mg Oral Daily [name], [name], MD   40 mg at 03/17/20 0925  . prazosin (MINIPRESS) capsule 1 mg  1 mg Oral QHS [name], [name], MD   1 mg at 03/15/20 2109  . QUEtiapine (SEROQUEL) tablet 100 mg  100 mg Oral QID [name], [name], MD   100 mg at 03/17/20 2449  . QUEtiapine (SEROQUEL) tablet 400 mg  400 mg Oral QHS [name], NP   400 mg at 03/16/20 2142  . sertraline (ZOLOFT) tablet 100 mg  100 mg Oral BID [name], [name], MD   100 mg at 03/17/20 0923  . simethicone (MYLICON) chewable tablet 80 mg  80 mg Oral TID [name], [name], MD   80 mg at 03/17/20 7530  . simvastatin (ZOCOR) tablet 20 mg  20 mg Oral q1800 [name], [name], MD   20 mg at 03/16/20 1759  . tamsulosin (FLOMAX) capsule 0.8 mg  0.8 mg Oral QPC supper [name], [name], MD   0.8 mg at 03/16/20 1802     Discharge Medications: Please see discharge summary for a list of discharge medications.  Relevant Imaging Results:  Relevant Lab Results:   Additional Information    [name], LCSW

## 2020-03-17 NOTE — [facility] Counselor (Signed)
CSW spoke with Bethann Berkshire, who interviewed the patient for possible group home placement, she reports "we'll see how this goes, he seems like he'll be fine".  CSW confirmed that she was willing to accept the patient.  She reports that she wants to speak with the patient's brother before accepting the patient.  Once she has had the opportunity to speak with the patient's brother she will follow up with this CSW on her decision.    Assunta Curtis, MSW, LCSW 03/17/2020 11:26 AM

## 2020-03-17 NOTE — Tx Team (Signed)
Interdisciplinary Treatment and Diagnostic Plan Update  03/17/2020 Time of Session: 8:30 AM JASANI DOLNEY MRN: [id number]  Principal Diagnosis: Schizoaffective disorder, bipolar type ([place])  Secondary Diagnoses: Principal Problem:   Schizoaffective disorder, bipolar type ([place]) Active Problems:   Active autistic disorder   Diabetes ([place])   Hypertension   Prostate hypertrophy   Intellectual disability   At risk for elopement   Current Medications:  Current Facility-Administered Medications  Medication Dose Route Frequency Provider Last Rate Last Admin  . acetaminophen (TYLENOL) tablet 650 mg  650 mg Oral Q6H PRN [name], MD      . alum & mag hydroxide-simeth (MAALOX/MYLANTA) 200-200-20 MG/5ML suspension 30 mL  30 mL Oral Q4H PRN [name], MD      . aspirin EC tablet 81 mg  81 mg Oral Daily [name], [name], MD   81 mg at 03/17/20 0925  . clonazePAM (KLONOPIN) tablet 1 mg  1 mg Oral QID [name], [name], MD   1 mg at 03/17/20 1607  . docusate sodium (COLACE) capsule 100 mg  100 mg Oral BID [name], [name], MD   100 mg at 03/17/20 0923  . gabapentin (NEURONTIN) capsule 100 mg  100 mg Oral TID [name], [name], MD   100 mg at 03/17/20 0923  . hydrOXYzine (ATARAX/VISTARIL) tablet 10 mg  10 mg Oral TID PRN [name], MD   10 mg at 03/16/20 1437  . lamoTRIgine (LAMICTAL) tablet 100 mg  100 mg Oral QHS [name], [name], MD   100 mg at 03/16/20 2143  . linagliptin (TRADJENTA) tablet 5 mg  5 mg Oral Daily [name], [name], MD   5 mg at 03/17/20 0925  . loratadine (CLARITIN) tablet 10 mg  10 mg Oral Daily [name], [name], MD   10 mg at 03/17/20 0924  . magnesium hydroxide (MILK OF MAGNESIA) suspension 30 mL  30 mL Oral Daily PRN [name], MD   30 mL at 03/16/20 1119  . metFORMIN (GLUCOPHAGE) tablet 850 mg  850 mg Oral BID WC [name], [name], MD   850 mg at 03/17/20 0924  . metoprolol succinate (TOPROL-XL) 24 hr tablet 50 mg  50 mg Oral Daily [name], [name], MD   50 mg  at 03/17/20 0923  . oxybutynin (DITROPAN) tablet 10 mg  10 mg Oral BID [name], [name], MD   10 mg at 03/17/20 0924  . pantoprazole (PROTONIX) EC tablet 40 mg  40 mg Oral Daily [name], [name], MD   40 mg at 03/17/20 0925  . prazosin (MINIPRESS) capsule 1 mg  1 mg Oral QHS [name], [name], MD   1 mg at 03/15/20 2109  . QUEtiapine (SEROQUEL) tablet 100 mg  100 mg Oral QID [name], [name], MD   100 mg at 03/17/20 3710  . QUEtiapine (SEROQUEL) tablet 400 mg  400 mg Oral QHS [name], NP   400 mg at 03/16/20 2142  . sertraline (ZOLOFT) tablet 100 mg  100 mg Oral BID [name], [name], MD   100 mg at 03/17/20 0923  . simethicone (MYLICON) chewable tablet 80 mg  80 mg Oral TID [name], [name], MD   80 mg at 03/17/20 6269  . simvastatin (ZOCOR) tablet 20 mg  20 mg Oral q1800 [name], [name], MD   20 mg at 03/16/20 1759  . tamsulosin (FLOMAX) capsule 0.8 mg  0.8 mg Oral QPC supper [name], [name], MD   0.8 mg at 03/16/20 1802  PTA Medications: Medications Prior to Admission  Medication Sig Dispense Refill Last Dose  . acetaminophen (TYLENOL) 650 MG CR tablet Take 650 mg by mouth every 12 (twelve) hours as needed for pain.     Marland Kitchen aspirin EC 81 MG tablet Take 81 mg by mouth daily.     . Calcium Carbonate-Vitamin D3 (CALCIUM 600-D) 600-400 MG-UNIT TABS Take 1 tablet by mouth 2 (two) times daily.     . Cholecalciferol (VITAMIN D-1000 MAX ST) 1000 UNITS tablet Take 1,000 Units by mouth daily.      Marland Kitchen docusate sodium (COLACE) 100 MG capsule Take 100 mg by mouth daily.      . furosemide (LASIX) 20 MG tablet Take 20 mg by mouth daily.      Marland Kitchen gabapentin (NEURONTIN) 100 MG capsule Take 1 capsule (100 mg total) by mouth 3 (three) times daily. 90 capsule 10   . Insulin Detemir (LEVEMIR FLEXTOUCH) 100 UNIT/ML Pen Inject 15 Units into the skin daily at 10 pm.     . lamoTRIgine (LAMICTAL) 100 MG tablet Take 1 tablet (100 mg total) by mouth at bedtime. 30 tablet 10   . loratadine (CLARITIN) 10 MG tablet Take  1 tablet (10 mg total) by mouth daily. 30 tablet 5   . LORazepam (ATIVAN) 1 MG tablet Take 1 tablet (1 mg total) by mouth 3 (three) times daily. 90 tablet 5   . lurasidone (LATUDA) 40 MG TABS tablet Take 1 tablet (40 mg total) by mouth daily. with food 30 tablet 10   . metoprolol succinate (TOPROL-XL) 50 MG 24 hr tablet Take 50 mg by mouth daily.      . NON FORMULARY cpap device     . oxybutynin (DITROPAN) 5 MG tablet Take 5 mg by mouth daily.     . pantoprazole (PROTONIX) 40 MG tablet Take 40 mg by mouth daily.     . polyethylene glycol (MIRALAX / GLYCOLAX) packet Take 17 g by mouth daily.     . QUEtiapine (SEROQUEL) 100 MG tablet TAKE 1 TABLET  BY MOUTH THREE TIMES A DAY (Patient taking differently: Take 100 mg by mouth 3 (three) times daily. TAKE 1 TABLET  BY MOUTH THREE TIMES A DAY) 90 tablet 11   . QUEtiapine (SEROQUEL) 400 MG tablet Take 1 tablet (400 mg total) by mouth at bedtime. 30 tablet 10   . sertraline (ZOLOFT) 100 MG tablet Take 2 tablets (200 mg total) by mouth daily. (Patient taking differently: Take 200 mg by mouth daily. Take along with two 50 mg tablets (100 mg) for total 300 mg daily) 60 tablet 5   . sertraline (ZOLOFT) 50 MG tablet Take 2 tablets (100 mg total) by mouth daily. (Patient taking differently: Take 100 mg by mouth daily. Take along with two 100 mg tablets (200 mg) for total 300 mg daily) 60 tablet 5   . simethicone (MYLICON) 80 MG chewable tablet Chew 80-160 mg by mouth 2 (two) times daily as needed for flatulence.     . simvastatin (ZOCOR) 20 MG tablet Take 20 mg by mouth at bedtime.      . sitaGLIPtin (JANUVIA) 100 MG tablet Take 100 mg by mouth daily.     . solifenacin (VESICARE) 5 MG tablet Take 5 mg by mouth daily.     . Starch (HEMORRHOIDAL RE) Place 1 application rectally 4 (four) times daily as needed (for itching).     . tamsulosin (FLOMAX) 0.4 MG CAPS capsule Take 0.4 mg by mouth daily.  Patient Stressors: Health problems Marital or family  conflict Medication change or noncompliance Traumatic event  Patient Strengths: Motivation for treatment/growth Religious Affiliation  Treatment Modalities: Medication Management, Group therapy, Case management,  1 to 1 session with clinician, Psychoeducation, Recreational therapy.   Physician Treatment Plan for Primary Diagnosis: Schizoaffective disorder, bipolar type ([place]) Long Term Goal(s): Improvement in symptoms so as ready for discharge Improvement in symptoms so as ready for discharge   Short Term Goals: Ability to verbalize feelings will improve Ability to demonstrate self-control will improve Ability to maintain clinical measurements within normal limits will improve Compliance with prescribed medications will improve  Medication Management: Evaluate patient's response, side effects, and tolerance of medication regimen.  Therapeutic Interventions: 1 to 1 sessions, Unit Group sessions and Medication administration.  Evaluation of Outcomes: Progressing  Physician Treatment Plan for Secondary Diagnosis: Principal Problem:   Schizoaffective disorder, bipolar type ([place]) Active Problems:   Active autistic disorder   Diabetes ([place])   Hypertension   Prostate hypertrophy   Intellectual disability   At risk for elopement  Long Term Goal(s): Improvement in symptoms so as ready for discharge Improvement in symptoms so as ready for discharge   Short Term Goals: Ability to verbalize feelings will improve Ability to demonstrate self-control will improve Ability to maintain clinical measurements within normal limits will improve Compliance with prescribed medications will improve     Medication Management: Evaluate patient's response, side effects, and tolerance of medication regimen.  Therapeutic Interventions: 1 to 1 sessions, Unit Group sessions and Medication administration.  Evaluation of Outcomes: Progressing   RN Treatment Plan for Primary Diagnosis: Schizoaffective  disorder, bipolar type ([place]) Long Term Goal(s): Knowledge of disease and therapeutic regimen to maintain health will improve  Short Term Goals: Ability to demonstrate self-control, Ability to participate in decision making will improve, Ability to verbalize feelings will improve, Ability to disclose and discuss suicidal ideas, Ability to identify and develop effective coping behaviors will improve and Compliance with prescribed medications will improve  Medication Management: RN will administer medications as ordered by provider, will assess and evaluate patient's response and provide education to patient for prescribed medication. RN will report any adverse and/or side effects to prescribing provider.  Therapeutic Interventions: 1 on 1 counseling sessions, Psychoeducation, Medication administration, Evaluate responses to treatment, Monitor vital signs and CBGs as ordered, Perform/monitor CIWA, COWS, AIMS and Fall Risk screenings as ordered, Perform wound care treatments as ordered.  Evaluation of Outcomes: Progressing   LCSW Treatment Plan for Primary Diagnosis: Schizoaffective disorder, bipolar type ([place]) Long Term Goal(s): Safe transition to appropriate next level of care at discharge, Engage patient in therapeutic group addressing interpersonal concerns.  Short Term Goals: Engage patient in aftercare planning with referrals and resources, Increase social support, Increase ability to appropriately verbalize feelings, Increase emotional regulation and Increase skills for wellness and recovery  Therapeutic Interventions: Assess for all discharge needs, 1 to 1 time with Social worker, Explore available resources and support systems, Assess for adequacy in community support network, Educate family and significant other(s) on suicide prevention, Complete Psychosocial Assessment, Interpersonal group therapy.  Evaluation of Outcomes: Progressing   Progress in Treatment: Attending groups:  Yes. Participating in groups: Yes. Taking medication as prescribed: Yes. Toleration medication: Yes. Family/Significant other contact made: Yes, individual(s) contacted:  [place], Brother  Patient understands diagnosis: Yes. Discussing patient identified problems/goals with staff: Yes. Medical problems stabilized or resolved: Yes. Denies suicidal/homicidal ideation: Yes. Issues/concerns per patient self-inventory: No. Other:   New problem(s) identified:  No, Describe:  None  New Short Term/Long Term Goal(s): Elimination of symptoms of psychosis, medication management for mood stabilization; elimination of SI thoughts; development of comprehensive mental wellness/sobriety plan. Update 03/01/20: No changes at this time. 03/07/20: Update, no changes at this time Update 03/12/2020:  No changes at this time. Update 03/17/2020:  No changes at this time  Patient Goals:  Patient stated he would like to go back to [place] and reconnect with his peer support. Patient also stated that he would like to increase his social support and help his brother. Update 03/07/20, no change Update 03/12/2020:  No changes at this time.  Update 03/17/2020:  No changes at this time.   Discharge Plan or Barriers: Patient has an interview with a group home on 03/09/20 at 2:00 PM. Update 03/12/2020:  CSW continues to assist the patient in developing a housing plan.   Update 03/17/2020:  CSW has assisted patient in completing an interview with a group home, however, there has been no word on if the patient has been approved.  Patient continues to struggle with his anxiety and nerves.  Nurses report that he has increased in bed wetting.  Psychiatrist and nurses believe that this may be related to his increased anxiety about finding a home.  Reason for Continuation of Hospitalization: Anxiety Depression Medication stabilization  Estimated Length of Stay: TBD  Attendees: Patient: 03/17/2020 10:04 AM  Physician: [name], MD 03/17/2020  10:04 AM  Nursing:  03/17/2020 10:04 AM  RN Care Manager: 03/17/2020 10:04 AM  Social Worker: Assunta Curtis, LCSW 03/17/2020 10:04 AM  Recreational Therapist:  03/17/2020 10:04 AM  Other:  03/17/2020 10:04 AM  Other:  03/17/2020 10:04 AM  Other: 03/17/2020 10:04 AM    Scribe for Treatment Team: [name], LCSW 03/17/2020 10:04 AM

## 2020-03-17 NOTE — Plan of Care (Signed)
Pt denies depression,anxiety, SI, HI and AVH. Pt was educated on care plan and verbalizes understanding. Pt was encouraged to attend groups, wash his clothes and take a shower. Collier Bullock RN Problem: Education: Goal: Knowledge of [facility] Education information/materials will improve Outcome: Progressing Goal: Emotional status will improve Outcome: Progressing Goal: Mental status will improve Outcome: Progressing Goal: Verbalization of understanding the information provided will improve Outcome: Progressing   Problem: Activity: Goal: Interest or engagement in activities will improve Outcome: Progressing Goal: Sleeping patterns will improve Outcome: Progressing   Problem: Coping: Goal: Ability to verbalize frustrations and anger appropriately will improve Outcome: Progressing Goal: Ability to demonstrate self-control will improve Outcome: Progressing   Problem: Health Behavior/Discharge Planning: Goal: Identification of resources available to assist in meeting health care needs will improve Outcome: Progressing Goal: Compliance with treatment plan for underlying cause of condition will improve Outcome: Progressing   Problem: Physical Regulation: Goal: Ability to maintain clinical measurements within normal limits will improve Outcome: Progressing   Problem: Safety: Goal: Periods of time without injury will increase Outcome: Progressing   Problem: Education: Goal: Ability to make informed decisions regarding treatment will improve Outcome: Progressing   Problem: Coping: Goal: Coping ability will improve Outcome: Progressing   Problem: Health Behavior/Discharge Planning: Goal: Identification of resources available to assist in meeting health care needs will improve Outcome: Progressing   Problem: Medication: Goal: Compliance with prescribed medication regimen will improve Outcome: Progressing   Problem: Self-Concept: Goal: Ability to disclose and discuss  suicidal ideas will improve Outcome: Progressing Goal: Will verbalize positive feelings about self Outcome: Progressing

## 2020-03-17 NOTE — Progress Notes (Signed)
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 Had one accident in the dayroom of urinary incontinence. Had difficulty figuring out how to put on the diaper. Patient values independence and prefers to do everything himself with guidance from staff. He had put the diaper on top of his underwear and was educated on how to put the diaper on first and then put underwear over it. Denies SI, HI and AVH

## 2020-03-17 NOTE — Plan of Care (Signed)
  Problem: Education: Goal: Knowledge of [facility] Education information/materials will improve Outcome: Progressing Goal: Emotional status will improve Outcome: Progressing Goal: Mental status will improve Outcome: Progressing Goal: Verbalization of understanding the information provided will improve Outcome: Progressing   Problem: Activity: Goal: Interest or engagement in activities will improve Outcome: Progressing Goal: Sleeping patterns will improve Outcome: Progressing   

## 2020-03-18 NOTE — BHH Counselor (Signed)
CSW received permission from pt to fax [facility] to:  [facility], [phone] and [facility], [phone]  CSW received confirmation that faxes were successful.    CSW sent updated FL2 to [name], [phone].  CSW updated the patient's brother on the patient's progress.  Assunta Curtis, MSW, LCSW 03/18/2020 3:28 PM

## 2020-03-18 NOTE — BHH Group Notes (Signed)
[place] Group Notes:  (Nursing/MHT/Case Management/Adjunct)  Date:  03/18/2020  Time:  9:25 PM  Type of Therapy:  Group Therapy  Participation Level:  Active  Participation Quality:  Appropriate  Affect:  Appropriate  Cognitive:  Alert  Insight:  Good  Engagement in Group:  Engaged and goal is to control temper.  Modes of Intervention:  Support  Summary of Progress/Problems:  Nehemiah Settle 03/18/2020, 9:25 PM

## 2020-03-18 NOTE — Plan of Care (Signed)
  Problem: Group Participation °Goal: STG - Patient will engage in groups with a calm and appropriate mood at least 2x within 5 recreation therapy group sessions °Description: STG - Patient will engage in groups with a calm and appropriate mood at least 2x within 5 recreation therapy group sessions °Outcome: Progressing °  °

## 2020-03-18 NOTE — Plan of Care (Signed)
Pt denies depression, anxiety, SI, HI and AVH. Pt was educated on care plan and verbalizes understanding. Pt was encouraged to attend groups. Collier Bullock RN Problem: Education: Goal: Knowledge of [place] [facility] Education information/materials will improve Outcome: Progressing Goal: Emotional status will improve Outcome: Progressing Goal: Mental status will improve Outcome: Progressing Goal: Verbalization of understanding the information provided will improve Outcome: Progressing   Problem: Activity: Goal: Interest or engagement in activities will improve Outcome: Progressing Goal: Sleeping patterns will improve Outcome: Progressing   Problem: Coping: Goal: Ability to verbalize frustrations and anger appropriately will improve Outcome: Progressing Goal: Ability to demonstrate self-control will improve Outcome: Progressing   Problem: Health Behavior/Discharge Planning: Goal: Identification of resources available to assist in meeting health care needs will improve Outcome: Progressing Goal: Compliance with treatment plan for underlying cause of condition will improve Outcome: Progressing   Problem: Physical Regulation: Goal: Ability to maintain clinical measurements within normal limits will improve Outcome: Progressing   Problem: Safety: Goal: Periods of time without injury will increase Outcome: Progressing   Problem: Education: Goal: Ability to make informed decisions regarding treatment will improve Outcome: Progressing   Problem: Coping: Goal: Coping ability will improve Outcome: Progressing   Problem: Health Behavior/Discharge Planning: Goal: Identification of resources available to assist in meeting health care needs will improve Outcome: Progressing   Problem: Medication: Goal: Compliance with prescribed medication regimen will improve Outcome: Progressing   Problem: Self-Concept: Goal: Ability to disclose and discuss suicidal ideas will  improve Outcome: Progressing Goal: Will verbalize positive feelings about self Outcome: Progressing

## 2020-03-18 NOTE — Progress Notes (Signed)
Patient presents pleasant, calm and cooperative. Denies any SI, HI, AVH. Visible in dayroom, coloring and writing. Pt reports having a large BM and would like to continue with a stool softener everyday. Minimal interaction with peers. Reports had a good day but would not elaborate. Encouragement and support provided. Safety checks maintained. Medications given as prescribed. Pt receptive and remains safe on unit with q 15 min checks.

## 2020-03-18 NOTE — Progress Notes (Signed)
Recreation Therapy Notes  Date: 03/18/2020  Time: 9:30 am  Location: Craft room   Behavioral response: Appropriate  Intervention Topic: Stress Management    Discussion/Intervention:  Group content on today was focused on stress. The group defined stress and way to cope with stress. Participants expressed how they know when they are stresses out. Individuals described the different ways they have to cope with stress. The group stated reasons why it is important to cope with stress. Patient explained what good stress is and some examples. The group participated in the intervention "Stress Management". Individuals were separated into two group and answered questions related to stress.   Clinical Observations/Feedback:  Patient came to group and explained that he concentrates on his art work to manage his stress. He stated that he likes to get fresh air to manage his stress. Participant expressed that he is grumpy,tired and weak when he does not manage his stress appropriately. Individual was social with peers and staff while participating in the intervention.  Abdelrahman Nair LRT/CTRS         Joleigh Mineau 03/18/2020 1:25 PM

## 2020-03-18 NOTE — Plan of Care (Signed)
°  Problem: Education: Goal: Emotional status will improve Outcome: Progressing Goal: Mental status will improve Outcome: Progressing Goal: Verbalization of understanding the information provided will improve Outcome: Progressing   Problem: Activity: Goal: Sleeping patterns will improve Outcome: Progressing   Problem: Safety: Goal: Periods of time without injury will increase Outcome: Progressing

## 2020-03-18 NOTE — Progress Notes (Signed)
[facility] MD Progress Note  03/18/2020 3:39 PM Alexander Beard  MRN:  [id number] Subjective: Follow-up for this patient with schizoaffective disorder.  No new complaints.  Mood pretty good.  Thoughts lucid.  Behavior calm.  Still has enuresis but no worse than previously.  No reports of suicidal ideation or psychotic symptoms Principal Problem: Schizoaffective disorder, bipolar type ([place]) Diagnosis: Principal Problem:   Schizoaffective disorder, bipolar type ([place]) Active Problems:   Active autistic disorder   Diabetes ([place])   Hypertension   Prostate hypertrophy   Intellectual disability   At risk for elopement  Total Time spent with patient: 30 minutes  Past Psychiatric History: Past history of longstanding deficits  Past Medical History:  Past Medical History:  Diagnosis Date  . Anxiety   . Anxiety disorder due to known physiological condition    HOSPITALIZED 10/18  . Arthritis   . Autistic disorder, residual state   . COPD (chronic obstructive pulmonary disease) ([place])   . Depression   . Developmental disorder   . Diabetes mellitus without complication ([place])   . Dyslipidemia   . Esophageal reflux   . HOH (hard of hearing)    MILDLY  . Hypertension   . Obesity   . Overactive bladder   . Palpitations    ANXIETY  . Schizophrenia, schizoaffective ([place])   . Sleep apnea   . Tremors of nervous system    HANDS DUE TO MEDICATIONS  . Urinary incontinence     Past Surgical History:  Procedure Laterality Date  . CATARACT EXTRACTION W/PHACO Left 11/14/2017   Procedure: CATARACT EXTRACTION PHACO AND INTRAOCULAR LENS PLACEMENT (IOC);  Surgeon: Birder Robson, MD;  Location: [facility] ORS;  Service: Ophthalmology;  Laterality: Left;  [place] 00:42 AP% 14.6 CDE 6.12 Fluid pack lot # [id number]  . COLONOSCOPY  01/28/2005  . COLONOSCOPY WITH PROPOFOL N/A 05/09/2016   Procedure: COLONOSCOPY WITH PROPOFOL;  Surgeon: Manya Silvas, MD;  Location: [facility] ENDOSCOPY;  Service: Endoscopy;  Laterality:  N/A;  . ESOPHAGOGASTRODUODENOSCOPY N/A 05/09/2016   Procedure: ESOPHAGOGASTRODUODENOSCOPY (EGD);  Surgeon: Manya Silvas, MD;  Location: [facility] ENDOSCOPY;  Service: Endoscopy;  Laterality: N/A;  . FRACTURE SURGERY     ORIF SHOULDER  . TONSILLECTOMY     Family History:  Family History  Problem Relation Age of Onset  . Hypertension Mother   . Stroke Father   . Heart Problems Father    Family Psychiatric  History: See previous Social History:  Social History   Substance and Sexual Activity  Alcohol Use No  . Alcohol/week: 0.0 standard drinks     Social History   Substance and Sexual Activity  Drug Use No    Social History   Socioeconomic History  . Marital status: Single    Spouse name: Not on file  . Number of children: Not on file  . Years of education: Not on file  . Highest education level: Not on file  Occupational History  . Not on file  Tobacco Use  . Smoking status: Never Smoker  . Smokeless tobacco: Never Used  Vaping Use  . Vaping Use: Never used  Substance and Sexual Activity  . Alcohol use: No    Alcohol/week: 0.0 standard drinks  . Drug use: No  . Sexual activity: Never  Other Topics Concern  . Not on file  Social History Narrative   The patient never finished high school but did get his GED. He works in the past as a Museum/gallery conservator. He has never  been married and has no children. He is currently in disability and his brother Alexander Beard is his legal guardian. He has been living in group homes for many years.      No pending legal charges   Social Determinants of Health   Financial Resource Strain:   . Difficulty of Paying Living Expenses: Not on file  Food Insecurity:   . Worried About Charity fundraiser in the Last Year: Not on file  . Ran Out of Food in the Last Year: Not on file  Transportation Needs:   . Lack of Transportation (Medical): Not on file  . Lack of Transportation (Non-Medical): Not on file  Physical Activity:   . Days  of Exercise per Week: Not on file  . Minutes of Exercise per Session: Not on file  Stress:   . Feeling of Stress : Not on file  Social Connections:   . Frequency of Communication with Friends and Family: Not on file  . Frequency of Social Gatherings with Friends and Family: Not on file  . Attends Religious Services: Not on file  . Active Member of Clubs or Organizations: Not on file  . Attends Archivist Meetings: Not on file  . Marital Status: Not on file   Additional Social History:                         Sleep: Fair  Appetite:  Fair  Current Medications: Current Facility-Administered Medications  Medication Dose Route Frequency Provider Last Rate Last Admin  . acetaminophen (TYLENOL) tablet 650 mg  650 mg Oral Q6H PRN [name], MD      . alum & mag hydroxide-simeth (MAALOX/MYLANTA) 200-200-20 MG/5ML suspension 30 mL  30 mL Oral Q4H PRN [name], MD      . aspirin EC tablet 81 mg  81 mg Oral Daily Starr Urias, [name], MD   81 mg at 03/18/20 0825  . clonazePAM (KLONOPIN) tablet 1 mg  1 mg Oral QID Maylani Embree, [name], MD   1 mg at 03/18/20 1216  . docusate sodium (COLACE) capsule 100 mg  100 mg Oral BID Shirl Weir, [name], MD   100 mg at 03/18/20 0827  . gabapentin (NEURONTIN) capsule 100 mg  100 mg Oral TID Natacha Jepsen T, MD   100 mg at 03/18/20 1217  . hydrOXYzine (ATARAX/VISTARIL) tablet 10 mg  10 mg Oral TID PRN [name], MD   10 mg at 03/16/20 1437  . lamoTRIgine (LAMICTAL) tablet 100 mg  100 mg Oral QHS Caliyah Sieh, [name], MD   100 mg at 03/17/20 2139  . linagliptin (TRADJENTA) tablet 5 mg  5 mg Oral Daily Jakyiah Briones, [name], MD   5 mg at 03/18/20 0825  . loratadine (CLARITIN) tablet 10 mg  10 mg Oral Daily Lanina Larranaga, [name], MD   10 mg at 03/18/20 0826  . magnesium hydroxide (MILK OF MAGNESIA) suspension 30 mL  30 mL Oral Daily PRN [name], MD   30 mL at 03/16/20 1119  . metFORMIN (GLUCOPHAGE) tablet 850 mg  850 mg Oral BID WC Tobe Kervin,  [name], MD   850 mg at 03/18/20 4166  . metoprolol succinate (TOPROL-XL) 24 hr tablet 50 mg  50 mg Oral Daily Rahul Malinak, [name], MD   50 mg at 03/18/20 0829  . oxybutynin (DITROPAN) tablet 10 mg  10 mg Oral BID Stark Aguinaga, [name], MD   10 mg at 03/18/20 0630  . pantoprazole (  PROTONIX) EC tablet 40 mg  40 mg Oral Daily Daouda Lonzo, [name], MD   40 mg at 03/18/20 0827  . prazosin (MINIPRESS) capsule 1 mg  1 mg Oral QHS Shelby Anderle T, MD   1 mg at 03/17/20 2143  . QUEtiapine (SEROQUEL) tablet 100 mg  100 mg Oral QID Allan Bacigalupi T, MD   100 mg at 03/18/20 1216  . QUEtiapine (SEROQUEL) tablet 400 mg  400 mg Oral QHS [name], NP   400 mg at 03/17/20 2139  . sertraline (ZOLOFT) tablet 100 mg  100 mg Oral BID Torsten Weniger, [name], MD   100 mg at 03/18/20 0825  . simethicone (MYLICON) chewable tablet 80 mg  80 mg Oral TID Dolphus Linch, [name], MD   80 mg at 03/18/20 1216  . simvastatin (ZOCOR) tablet 20 mg  20 mg Oral q1800 Ayan Yankey, [name], MD   20 mg at 03/17/20 1721  . tamsulosin (FLOMAX) capsule 0.8 mg  0.8 mg Oral QPC supper Miriana Gaertner, [name], MD   0.8 mg at 03/17/20 1724    Lab Results: No results found for this or any previous visit (from the past 48 hour(s)).  Blood Alcohol level:  Lab Results  Component Value Date   ETH <10 01/22/2020   ETH <10 95/03/3266    Metabolic Disorder Labs: Lab Results  Component Value Date   HGBA1C 6.6 (H) 02/20/2020   MPG 142.72 02/20/2020   No results found for: PROLACTIN Lab Results  Component Value Date   CHOL 153 04/10/2015   TRIG 166 (H) 04/10/2015   HDL 50 04/10/2015   CHOLHDL 3.1 04/10/2015   VLDL 33 04/10/2015   LDLCALC 70 04/10/2015    Physical Findings: AIMS:  , ,  ,  ,    CIWA:    COWS:     Musculoskeletal: Strength & Muscle Tone: within normal limits Gait & Station: normal Patient leans: N/A  Psychiatric Specialty Exam: Physical Exam Vitals and nursing note reviewed.  Constitutional:      Appearance: He is well-developed.  HENT:      Head: Normocephalic and atraumatic.  Eyes:     Conjunctiva/sclera: Conjunctivae normal.     Pupils: Pupils are equal, round, and reactive to light.  Cardiovascular:     Heart sounds: Normal heart sounds.  Pulmonary:     Effort: Pulmonary effort is normal.  Abdominal:     Palpations: Abdomen is soft.  Musculoskeletal:        General: Normal range of motion.     Cervical back: Normal range of motion.  Skin:    General: Skin is warm and dry.  Neurological:     General: No focal deficit present.     Mental Status: He is alert.  Psychiatric:        Mood and Affect: Mood normal.        Thought Content: Thought content normal.        Judgment: Judgment normal.     Review of Systems  Constitutional: Negative.   HENT: Negative.   Eyes: Negative.   Respiratory: Negative.   Cardiovascular: Negative.   Gastrointestinal: Negative.   Genitourinary: Positive for enuresis.  Musculoskeletal: Negative.   Skin: Negative.   Neurological: Negative.   Psychiatric/Behavioral: Negative.     Blood pressure 119/67, pulse 61, temperature 98.2 F (36.8 C), temperature source Oral, resp. rate 18, height 5\' 8"  (1.727 m), weight 104.3 kg, SpO2 98 %.Body mass index is 34.96 kg/m.  General Appearance: Casual  Eye Contact:  Good  Speech:  Clear and Coherent  Volume:  Normal  Mood:  Euthymic  Affect:  Constricted  Thought Process:  Goal Directed  Orientation:  Full (Time, Place, and Person)  Thought Content:  Logical  Suicidal Thoughts:  No  Homicidal Thoughts:  No  Memory:  Immediate;   Fair Recent;   Fair Remote;   Fair  Judgement:  Fair  Insight:  Fair  Psychomotor Activity:  Normal  Concentration:  Concentration: Fair  Recall:  [employer] of Knowledge:  Fair  Language:  Fair  Akathisia:  No  Handed:  Right  AIMS (if indicated):     Assets:  Desire for Improvement  ADL's:  Intact  Cognition:  WNL  Sleep:  Number of Hours: 7     Treatment Plan Summary: Plan No change to  medication.  No change to treatment plan.  Continuing to seek placement.  Alethia Berthold, MD 03/18/2020, 3:39 PM

## 2020-03-18 NOTE — Progress Notes (Signed)
Patient is pleasant and cooperative. He is eager to engage in conversation and has anxious affect. He denies SI/HI/AVH depression and pain at his encounter. He is active on the unit and gets along well with his peers.  He continues with his drawing and coloring, which keps him occupied majority of the time.  He is med compliant and tolerated his meds without incident.  He remains safe on the unit with 15 minute safety rounds and was informed to contact staff with any concerns.      Cleo Butler-Nicholson, LPN

## 2020-03-18 NOTE — Progress Notes (Signed)
D- Patient alert and oriented. Affect/mood is pleasant and cooperative and times he is worried. Pt denies SI, HI, AVH, and pain. Pt has been to to groups and remains social. Pt is looking forward to going to a groups home.   A- Scheduled medications administered to patient, per MD orders. Support and encouragement provided.  Routine safety checks conducted every 15 minutes.  Patient informed to notify staff with problems or concerns.  R- No adverse drug reactions noted. Patient contracts for safety at this time. Patient compliant with medications and treatment plan. Patient receptive, calm, and cooperative. Patient interacts well with others on the unit.  Patient remains safe at this time.  Collier Bullock RN

## 2020-03-18 NOTE — [facility] Group Notes (Signed)
LCSW Group Therapy Note  03/18/2020 3:23 PM  Type of Therapy/Topic:  Group Therapy:  Emotion Regulation  Participation Level:  Active   Description of Group:   The purpose of this group is to assist patients in learning to regulate negative emotions and experience positive emotions. Patients will be guided to discuss ways in which they have been vulnerable to their negative emotions. These vulnerabilities will be juxtaposed with experiences of positive emotions or situations, and patients will be challenged to use positive emotions to combat negative ones. Special emphasis will be placed on coping with negative emotions in conflict situations, and patients will process healthy conflict resolution skills.  Therapeutic Goals: 1. Patient will identify two positive emotions or experiences to reflect on in order to balance out negative emotions 2. Patient will label two or more emotions that they find the most difficult to experience 3. Patient will demonstrate positive conflict resolution skills through discussion and/or role plays  Summary of Patient Progress: Patient was present in group.  Patient was supportive in group.  Patient required one on one support in group, however, once prompted he was able to complete the tasks on his own.  Patient was able to identify what he engages in to decrease his anxiety, however, seemed fixated on what was positive or not, when this was not the focus of the group.    Therapeutic Modalities:   Cognitive Behavioral Therapy Feelings Identification Dialectical Behavioral Therapy  Assunta Curtis, MSW, LCSW 03/18/2020 3:23 PM

## 2020-03-19 NOTE — Progress Notes (Signed)
[facility] MD Progress Note  03/19/2020 3:59 PM Alexander Beard  MRN:  [id number] Subjective: Follow-up patient with schizoaffective disorder and intellectual disability problems.  Patient has no new complaint.  Mood stable.  No evidence of psychosis.  Getting along well on the unit.  Still has enuresis but otherwise no physical complaints Principal Problem: Schizoaffective disorder, bipolar type ([place]) Diagnosis: Principal Problem:   Schizoaffective disorder, bipolar type ([place]) Active Problems:   Active autistic disorder   Diabetes ([place])   Hypertension   Prostate hypertrophy   Intellectual disability   At risk for elopement  Total Time spent with patient: 30 minutes  Past Psychiatric History: History of chronic mental health problems and behavior issues  Past Medical History:  Past Medical History:  Diagnosis Date  . Anxiety   . Anxiety disorder due to known physiological condition    HOSPITALIZED 10/18  . Arthritis   . Autistic disorder, residual state   . COPD (chronic obstructive pulmonary disease) ([place])   . Depression   . Developmental disorder   . Diabetes mellitus without complication ([name])   . Dyslipidemia   . Esophageal reflux   . HOH (hard of hearing)    MILDLY  . Hypertension   . Obesity   . Overactive bladder   . Palpitations    ANXIETY  . Schizophrenia, schizoaffective ([place])   . Sleep apnea   . Tremors of nervous system    HANDS DUE TO MEDICATIONS  . Urinary incontinence     Past Surgical History:  Procedure Laterality Date  . CATARACT EXTRACTION W/PHACO Left 11/14/2017   Procedure: CATARACT EXTRACTION PHACO AND INTRAOCULAR LENS PLACEMENT (IOC);  Surgeon: Birder Robson, MD;  Location: [facility] ORS;  Service: Ophthalmology;  Laterality: Left;  [place] 00:42 AP% 14.6 CDE 6.12 Fluid pack lot # [id number]  . COLONOSCOPY  01/28/2005  . COLONOSCOPY WITH PROPOFOL N/A 05/09/2016   Procedure: COLONOSCOPY WITH PROPOFOL;  Surgeon: Manya Silvas, MD;  Location: [facility] ENDOSCOPY;   Service: Endoscopy;  Laterality: N/A;  . ESOPHAGOGASTRODUODENOSCOPY N/A 05/09/2016   Procedure: ESOPHAGOGASTRODUODENOSCOPY (EGD);  Surgeon: Manya Silvas, MD;  Location: [facility] ENDOSCOPY;  Service: Endoscopy;  Laterality: N/A;  . FRACTURE SURGERY     ORIF SHOULDER  . TONSILLECTOMY     Family History:  Family History  Problem Relation Age of Onset  . Hypertension Mother   . Stroke Father   . Heart Problems Father    Family Psychiatric  History: See previous Social History:  Social History   Substance and Sexual Activity  Alcohol Use No  . Alcohol/week: 0.0 standard drinks     Social History   Substance and Sexual Activity  Drug Use No    Social History   Socioeconomic History  . Marital status: Single    Spouse name: Not on file  . Number of children: Not on file  . Years of education: Not on file  . Highest education level: Not on file  Occupational History  . Not on file  Tobacco Use  . Smoking status: Never Smoker  . Smokeless tobacco: Never Used  Vaping Use  . Vaping Use: Never used  Substance and Sexual Activity  . Alcohol use: No    Alcohol/week: 0.0 standard drinks  . Drug use: No  . Sexual activity: Never  Other Topics Concern  . Not on file  Social History Narrative   The patient never finished high school but did get his GED. 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 He has never been married and has no children. He is currently in disability and [name] is his legal guardian. He has been living in group homes for many years.      No pending legal charges   Social Determinants of Health   Financial Resource Strain:   . Difficulty of Paying Living Expenses: Not on file  Food Insecurity:   . Worried About Charity fundraiser in the Last Year: Not on file  . Ran Out of Food in the Last Year: Not on file  Transportation Needs:   . Lack of Transportation (Medical): Not on file  . Lack of Transportation (Non-Medical): Not on  file  Physical Activity:   . Days of Exercise per Week: Not on file  . Minutes of Exercise per Session: Not on file  Stress:   . Feeling of Stress : Not on file  Social Connections:   . Frequency of Communication with Friends and Family: Not on file  . Frequency of Social Gatherings with Friends and Family: Not on file  . Attends Religious Services: Not on file  . Active Member of Clubs or Organizations: Not on file  . Attends Archivist Meetings: Not on file  . Marital Status: Not on file   Additional Social History:                         Sleep: Fair  Appetite:  Fair  Current Medications: Current Facility-Administered Medications  Medication Dose Route Frequency Provider Last Rate Last Admin  . acetaminophen (TYLENOL) tablet 650 mg  650 mg Oral Q6H PRN [name], MD      . alum & mag hydroxide-simeth (MAALOX/MYLANTA) 200-200-20 MG/5ML suspension 30 mL  30 mL Oral Q4H PRN [name], MD      . aspirin EC tablet 81 mg  81 mg Oral Daily Aundra Pung, [name], MD   81 mg at 03/19/20 0831  . clonazePAM (KLONOPIN) tablet 1 mg  1 mg Oral QID Mehek Grega, [name], MD   1 mg at 03/19/20 1231  . docusate sodium (COLACE) capsule 100 mg  100 mg Oral BID Jovonna Nickell, [name], MD   100 mg at 03/19/20 0831  . gabapentin (NEURONTIN) capsule 100 mg  100 mg Oral TID Jayko Voorhees T, MD   100 mg at 03/19/20 1231  . hydrOXYzine (ATARAX/VISTARIL) tablet 10 mg  10 mg Oral TID PRN [name], MD   10 mg at 03/16/20 1437  . lamoTRIgine (LAMICTAL) tablet 100 mg  100 mg Oral QHS Bessie Boyte T, MD   100 mg at 03/18/20 2155  . linagliptin (TRADJENTA) tablet 5 mg  5 mg Oral Daily Kathelene Rumberger, [name], MD   5 mg at 03/19/20 0831  . loratadine (CLARITIN) tablet 10 mg  10 mg Oral Daily Peniel Hass, [name], MD   10 mg at 03/19/20 0830  . magnesium hydroxide (MILK OF MAGNESIA) suspension 30 mL  30 mL Oral Daily PRN [name], MD   30 mL at 03/16/20 1119  . metFORMIN (GLUCOPHAGE) tablet 850  mg  850 mg Oral BID WC Presley Summerlin, [name], MD   850 mg at 03/19/20 0831  . metoprolol succinate (TOPROL-XL) 24 hr tablet 50 mg  50 mg Oral Daily Brynda Heick T, MD   50 mg at 03/19/20 1240  . oxybutynin (DITROPAN) tablet 10 mg  10 mg Oral BID Sylas Twombly, [name], MD   10 mg at 03/19/20  0830  . pantoprazole (PROTONIX) EC tablet 40 mg  40 mg Oral Daily Tequila Rottmann, [name], MD   40 mg at 03/19/20 0831  . prazosin (MINIPRESS) capsule 1 mg  1 mg Oral QHS Artin Mceuen T, MD   1 mg at 03/18/20 2155  . QUEtiapine (SEROQUEL) tablet 100 mg  100 mg Oral QID Jerelle Virden T, MD   100 mg at 03/19/20 1231  . QUEtiapine (SEROQUEL) tablet 400 mg  400 mg Oral QHS [name], NP   400 mg at 03/18/20 2155  . sertraline (ZOLOFT) tablet 100 mg  100 mg Oral BID Iram Astorino, [name], MD   100 mg at 03/19/20 0831  . simethicone (MYLICON) chewable tablet 80 mg  80 mg Oral TID Ascher Schroepfer, [name], MD   80 mg at 03/19/20 1231  . simvastatin (ZOCOR) tablet 20 mg  20 mg Oral q1800 Johnelle Tafolla, [name], MD   20 mg at 03/18/20 1733  . tamsulosin (FLOMAX) capsule 0.8 mg  0.8 mg Oral QPC supper Shomari Matusik, [name], MD   0.8 mg at 03/18/20 1733    Lab Results: No results found for this or any previous visit (from the past 48 hour(s)).  Blood Alcohol level:  Lab Results  Component Value Date   ETH <10 01/22/2020   ETH <10 26/37/8588    Metabolic Disorder Labs: Lab Results  Component Value Date   HGBA1C 6.6 (H) 02/20/2020   MPG 142.72 02/20/2020   No results found for: PROLACTIN Lab Results  Component Value Date   CHOL 153 04/10/2015   TRIG 166 (H) 04/10/2015   HDL 50 04/10/2015   CHOLHDL 3.1 04/10/2015   VLDL 33 04/10/2015   LDLCALC 70 04/10/2015    Physical Findings: AIMS:  , ,  ,  ,    CIWA:    COWS:     Musculoskeletal: Strength & Muscle Tone: within normal limits Gait & Station: normal Patient leans: N/A  Psychiatric Specialty Exam: Physical Exam Vitals and nursing note reviewed.  Constitutional:       Appearance: He is well-developed.  HENT:     Head: Normocephalic and atraumatic.  Eyes:     Conjunctiva/sclera: Conjunctivae normal.     Pupils: Pupils are equal, round, and reactive to light.  Cardiovascular:     Heart sounds: Normal heart sounds.  Pulmonary:     Effort: Pulmonary effort is normal.  Abdominal:     Palpations: Abdomen is soft.  Musculoskeletal:        General: Normal range of motion.     Cervical back: Normal range of motion.  Skin:    General: Skin is warm and dry.  Neurological:     General: No focal deficit present.     Mental Status: He is alert.  Psychiatric:        Mood and Affect: Mood normal.        Behavior: Behavior normal.     Review of Systems  Constitutional: Negative.   HENT: Negative.   Eyes: Negative.   Respiratory: Negative.   Cardiovascular: Negative.   Gastrointestinal: Negative.   Musculoskeletal: Negative.   Skin: Negative.   Neurological: Negative.   Psychiatric/Behavioral: Negative.     Blood pressure 108/74, pulse 70, temperature 98.3 F (36.8 C), temperature source Oral, resp. rate 18, height 5\' 8"  (1.727 m), weight 104.3 kg, SpO2 99 %.Body mass index is 34.96 kg/m.  General Appearance: Casual  Eye Contact:  Good  Speech:  Clear and Coherent  Volume:  Normal  Mood:  Euthymic  Affect:  Congruent  Thought Process:  Goal Directed  Orientation:  Full (Time, Place, and Person)  Thought Content:  Logical  Suicidal Thoughts:  No  Homicidal Thoughts:  No  Memory:  Immediate;   Fair Recent;   Fair Remote;   Fair  Judgement:  Fair  Insight:  Fair  Psychomotor Activity:  Normal  Concentration:  Concentration: Fair  Recall:  [employer] of Knowledge:  Fair  Language:  Fair  Akathisia:  No  Handed:  Right  AIMS (if indicated):     Assets:  Desire for Improvement Resilience  ADL's:  Intact  Cognition:  Impaired,  Mild  Sleep:  Number of Hours: 5     Treatment Plan Summary: Daily contact with patient to assess and  evaluate symptoms and progress in treatment, Medication management and Plan No change to current medicine.  Treatment team has discussed discharge plans and agree that assisted living or closed setting would be safer and more appropriate for him.  Working on continued discharge planning  Alethia Berthold, MD 03/19/2020, 3:59 PM

## 2020-03-19 NOTE — Plan of Care (Signed)
D: Pt alert and oriented. Pt denies experiencing any anxiety/depression at this time. Pt denies experiencing any pain at this time. Pt denies experiencing any SI/HI, or AVH at this time. 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 Pt voices that he knows he should take breaks but finds it difficult to stop and move onto other things. Pt is observed as being forgetful and mildly confused. Pt gets turned around easily.  A: Scheduled medications administered to pt, per MD orders. Support and encouragement provided. Frequent verbal contact made. Routine safety checks conducted q15 minutes.   R: No adverse drug reactions noted. Pt verbally contracts for safety at this time. Pt complaint with medications and treatment plan. Pt interacts well with others on the unit. Pt remains safe at this time. Will continue to monitor.   Problem: Education: Goal: Knowledge of [facility] Education information/materials will improve Outcome: Not Progressing Goal: Emotional status will improve Outcome: Not Progressing Goal: Mental status will improve Outcome: Not Progressing Goal: Verbalization of understanding the information provided will improve Outcome: Not Progressing   Problem: Activity: Goal: Interest or engagement in activities will improve Outcome: Not Progressing Goal: Sleeping patterns will improve Outcome: Not Progressing   Problem: Coping: Goal: Ability to verbalize frustrations and anger appropriately will improve Outcome: Not Progressing Goal: Ability to demonstrate self-control will improve Outcome: Not Progressing   Problem: Health Behavior/Discharge Planning: Goal: Identification of resources available to assist in meeting health care needs will improve Outcome: Not Progressing Goal: Compliance with treatment plan for underlying cause of condition will improve Outcome: Not Progressing   Problem: Physical Regulation: Goal: Ability to maintain clinical measurements within  normal limits will improve Outcome: Not Progressing   Problem: Safety: Goal: Periods of time without injury will increase Outcome: Not Progressing   Problem: Education: Goal: Ability to make informed decisions regarding treatment will improve Outcome: Not Progressing   Problem: Coping: Goal: Coping ability will improve Outcome: Not Progressing   Problem: Health Behavior/Discharge Planning: Goal: Identification of resources available to assist in meeting health care needs will improve Outcome: Not Progressing   Problem: Medication: Goal: Compliance with prescribed medication regimen will improve Outcome: Not Progressing   Problem: Self-Concept: Goal: Ability to disclose and discuss suicidal ideas will improve Outcome: Not Progressing Goal: Will verbalize positive feelings about self Outcome: Not Progressing

## 2020-03-19 NOTE — Plan of Care (Signed)
Patient presents at his baseline  Problem: Education: Goal: Emotional status will improve Outcome: Not Progressing Goal: Mental status will improve Outcome: Not Progressing   

## 2020-03-19 NOTE — Progress Notes (Signed)
Recreation Therapy Notes  Date: 03/19/2020  Time: 9:30 am   Location: Craft room     Behavioral response: N/A   Intervention Topic: Relaxation  Discussion/Intervention: Patient did not attend group.   Clinical Observations/Feedback:  Patient did not attend group.   Tiandre Teall LRT/CTRS        Baird Polinski 03/19/2020 12:56 PM

## 2020-03-19 NOTE — Progress Notes (Signed)
Patient calm and pleasant during assessment denying SI/HI/AVH. Patient states that he feels great. Patient observed by this Probation officer interacting appropriately with staff and peers on the unit. Pt compliant with medication administration per MD orders. Pt given education, support, and encouragement to be active in his treatment plan. Pt being monitored Q 15 minutes for safety per unit protocol. Pt remains safe on the unit.

## 2020-03-20 NOTE — Plan of Care (Signed)
Patient presents at his baseline  Problem: Education: Goal: Emotional status will improve Outcome: Not Progressing Goal: Mental status will improve Outcome: Not Progressing   

## 2020-03-20 NOTE — NC FL2 (Signed)
[place] LEVEL OF CARE SCREENING TOOL     IDENTIFICATION  Patient Name: Alexander Beard Birthdate: 05-Nov-1955 Sex: male Admission Date (Current Location): 02/19/2020  [facility] and [place] Number:  Engineering geologist and Address:  [facility], [address] [place], [place] [address]      Provider Number: [id number]  Attending Physician Name and Address:  Gonzella Lex, MD  Relative Name and Phone Number:       Current Level of Care: Hospital Recommended Level of Care: [place], Other (Comment), [place], [place] ([place], [place]) Prior Approval Number:    Date Approved/Denied:   PASRR Number:    Discharge Plan: Other (Comment) (Group Home, [place], Assisted Living, [place])    Current Diagnoses: Patient Active Problem List   Diagnosis Date Noted  . Intellectual disability 03/06/2020  . At risk for elopement 03/06/2020  . Diabetes ([place]) 04/10/2015  . Hypertension 04/10/2015  . Prostate hypertrophy 04/10/2015  . Schizoaffective disorder ([place]) 04/10/2015  . Enlarged prostate 04/10/2015  . Essential (primary) hypertension 04/10/2015  . Type 2 diabetes mellitus ([place]) 04/10/2015  . Controlled type 2 diabetes mellitus without complication ([place]) 88/50/2774  . Schizoaffective disorder, bipolar type ([place]) 12/29/2014  . Active autistic disorder 12/29/2014    Orientation RESPIRATION BLADDER Height & Weight     Self, Situation, Place, Time  Normal Incontinent Weight: 229 lb 15 oz (104.3 kg) Height:  5\' 8"  (172.7 cm)  BEHAVIORAL SYMPTOMS/MOOD NEUROLOGICAL BOWEL NUTRITION STATUS  Wanderer, Verbally abusive  (NA) Continent Diet (Carb Modified diet due to diabetes.)  AMBULATORY STATUS COMMUNICATION OF NEEDS Skin   Independent Verbally Normal                       Personal Care Assistance Level of Assistance   (NA)           Functional Limitations Info   (NA)           SPECIAL CARE FACTORS FREQUENCY  Blood pressure (Diabetic, blood sugars maintained)                    Contractures Contractures Info: Not present    Additional Factors Info  Allergies, Code Status Code Status Info: Full Allergies Info: Penicillins, Cortizone-10, "myacins"           Current Medications (03/20/2020):  This is the current hospital active medication list Current Facility-Administered Medications  Medication Dose Route Frequency Provider Last Rate Last Admin  . acetaminophen (TYLENOL) tablet 650 mg  650 mg Oral Q6H PRN [name], MD      . alum & mag hydroxide-simeth (MAALOX/MYLANTA) 200-200-20 MG/5ML suspension 30 mL  30 mL Oral Q4H PRN [name], MD      . aspirin EC tablet 81 mg  81 mg Oral Daily [name], [name], MD   81 mg at 03/20/20 1287  . clonazePAM (KLONOPIN) tablet 1 mg  1 mg Oral QID [name], [name], MD   1 mg at 03/20/20 8676  . docusate sodium (COLACE) capsule 100 mg  100 mg Oral BID [name], [name], MD   100 mg at 03/20/20 7209  . gabapentin (NEURONTIN) capsule 100 mg  100 mg Oral TID [name], [name], MD   100 mg at 03/20/20 4709  . hydrOXYzine (ATARAX/VISTARIL) tablet 10 mg  10 mg Oral TID PRN [name], MD   10 mg at 03/16/20 1437  .  lamoTRIgine (LAMICTAL) tablet 100 mg  100 mg Oral QHS [name], [name], MD   100 mg at 03/19/20 2123  . linagliptin (TRADJENTA) tablet 5 mg  5 mg Oral Daily [name], [name], MD   5 mg at 03/20/20 1155  . loratadine (CLARITIN) tablet 10 mg  10 mg Oral Daily [name], [name], MD   10 mg at 03/20/20 2080  . magnesium hydroxide (MILK OF MAGNESIA) suspension 30 mL  30 mL Oral Daily PRN [name], MD   30 mL at 03/16/20 1119  . metFORMIN (GLUCOPHAGE) tablet 850 mg  850 mg Oral BID WC [name], [name], MD   850 mg at 03/20/20 2233  . metoprolol succinate (TOPROL-XL) 24 hr tablet 50 mg  50 mg Oral Daily [name], [name], MD   50 mg at 03/20/20 6122  . oxybutynin (DITROPAN) tablet 10 mg  10 mg Oral  BID [name], [name], MD   10 mg at 03/20/20 4497  . pantoprazole (PROTONIX) EC tablet 40 mg  40 mg Oral Daily [name], [name], MD   40 mg at 03/20/20 5300  . prazosin (MINIPRESS) capsule 1 mg  1 mg Oral QHS [name], [name], MD   1 mg at 03/19/20 2123  . QUEtiapine (SEROQUEL) tablet 100 mg  100 mg Oral QID [name], [name], MD   100 mg at 03/20/20 5110  . QUEtiapine (SEROQUEL) tablet 400 mg  400 mg Oral QHS [name], NP   400 mg at 03/19/20 2122  . sertraline (ZOLOFT) tablet 100 mg  100 mg Oral BID [name], [name], MD   100 mg at 03/20/20 2111  . simethicone (MYLICON) chewable tablet 80 mg  80 mg Oral TID [name], [name], MD   80 mg at 03/20/20 7356  . simvastatin (ZOCOR) tablet 20 mg  20 mg Oral q1800 [name], [name], MD   20 mg at 03/19/20 1738  . tamsulosin (FLOMAX) capsule 0.8 mg  0.8 mg Oral QPC supper [name], [name], MD   0.8 mg at 03/19/20 1737     Discharge Medications: Please see discharge summary for a list of discharge medications.  Relevant Imaging Results:  Relevant Lab Results:   Additional Information    [name], LCSW

## 2020-03-20 NOTE — Progress Notes (Signed)
D: Pt alert and oriented. Pt denies experiencing any anxiety/depression at this time. Pt denies experiencing any pain at this time. Pt denies experiencing any SI/HI, or AVH at this time.   A: Scheduled medications administered to pt, per MD orders. Support and encouragement provided. Frequent verbal contact made. Routine safety checks conducted q15 minutes.   R: No adverse drug reactions noted. Pt verbally contracts for safety at this time. Pt complaint with medications and treatment plan. Pt interacts well with others on the unit. Pt remains safe at this time. Will continue to monitor.  

## 2020-03-20 NOTE — [facility] Group Notes (Signed)
[place] Group Notes:  (Nursing/MHT/Case Management/Adjunct)  Date:  03/20/2020  Time:  9:02 PM  Type of Therapy:  Group Therapy  Participation Level:  Active  Participation Quality:  Appropriate  Affect:  Appropriate  Cognitive:  Alert  Insight:  Good  Engagement in Group:  Engaged and goal was to work forward discharge and make note for new place and regain to things he enjoy before and arrange to meet with neces and brother.  Modes of Intervention:  Support  Summary of Progress/Problems:  Alexander Beard 03/20/2020, 9:02 PM

## 2020-03-20 NOTE — Progress Notes (Signed)
Patient calm and pleasant during assessment denying SI/HI/AVH. Patient states that he feels great. Patient observed by this Probation officer interacting appropriately with staff and peers on the unit. Pt compliant with medication administration per MD orders. Pt given education, support, and encouragement to be active in his treatment plan. Pt being monitored Q 15 minutes for safety per unit protocol. Pt remains safe on the unit.

## 2020-03-20 NOTE — Progress Notes (Signed)
[facility] MD Progress Note  03/20/2020 1:46 PM Alexander Beard  MRN:  [id number] Subjective: Patient with schizoaffective disorder developmental disability cognitive impairment.  No new complaint.  Says he feels pretty good today.  Not feeling depressed.  Denies suicidal ideation.  Interacts well with others on the unit.  No specific new physical complaints. Principal Problem: Schizoaffective disorder, bipolar type ([place]) Diagnosis: Principal Problem:   Schizoaffective disorder, bipolar type ([place]) Active Problems:   Active autistic disorder   Diabetes ([place])   Hypertension   Prostate hypertrophy   Intellectual disability   At risk for elopement  Total Time spent with patient: 30 minutes  Past Psychiatric History: Past history of longstanding chronic problems with disability  Past Medical History:  Past Medical History:  Diagnosis Date  . Anxiety   . Anxiety disorder due to known physiological condition    HOSPITALIZED 10/18  . Arthritis   . Autistic disorder, residual state   . COPD (chronic obstructive pulmonary disease) ([place])   . Depression   . Developmental disorder   . Diabetes mellitus without complication ([place])   . Dyslipidemia   . Esophageal reflux   . HOH (hard of hearing)    MILDLY  . Hypertension   . Obesity   . Overactive bladder   . Palpitations    ANXIETY  . Schizophrenia, schizoaffective ([place])   . Sleep apnea   . Tremors of nervous system    HANDS DUE TO MEDICATIONS  . Urinary incontinence     Past Surgical History:  Procedure Laterality Date  . CATARACT EXTRACTION W/PHACO Left 11/14/2017   Procedure: CATARACT EXTRACTION PHACO AND INTRAOCULAR LENS PLACEMENT (IOC);  Surgeon: Birder Robson, MD;  Location: [facility] ORS;  Service: Ophthalmology;  Laterality: Left;  [place] 00:42 AP% 14.6 CDE 6.12 Fluid pack lot # [id number]  . COLONOSCOPY  01/28/2005  . COLONOSCOPY WITH PROPOFOL N/A 05/09/2016   Procedure: COLONOSCOPY WITH PROPOFOL;  Surgeon: Manya Silvas, MD;   Location: [facility] END[facility]COPY;  Service: Endoscopy;  Laterality: N/A;  . ESOPHAGOGASTRODUODENOSCOPY N/A 05/09/2016   Procedure: ESOPHAGOGASTRODUODENOSCOPY (EGD);  Surgeon: Manya Silvas, MD;  Location: [facility] ENDOSCOPY;  Service: Endoscopy;  Laterality: N/A;  . FRACTURE SURGERY     ORIF SHOULDER  . TONSILLECTOMY     Family History:  Family History  Problem Relation Age of Onset  . Hypertension Mother   . Stroke Father   . Heart Problems Father    Family Psychiatric  History: None Social History:  Social History   Substance and Sexual Activity  Alcohol Use No  . Alcohol/week: 0.0 standard drinks     Social History   Substance and Sexual Activity  Drug Use No    Social History   Socioeconomic History  . Marital status: Single    Spouse name: Not on file  . Number of children: Not on file  . Years of education: Not on file  . Highest education level: Not on file  Occupational History  . Not on file  Tobacco Use  . Smoking status: Never Smoker  . Smokeless tobacco: Never Used  Vaping Use  . Vaping Use: Never used  Substance and Sexual Activity  . Alcohol use: No    Alcohol/week: 0.0 standard drinks  . Drug use: No  . Sexual activity: Never  Other Topics Concern  . Not on file  Social History Narrative   The patient never finished high school but did get his GED. 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 He has never been married and has no children. He is currently in disability and [name] is his legal guardian. He has been living in group homes for many years.      No pending legal charges   Social Determinants of Health   Financial Resource Strain:   . Difficulty of Paying Living Expenses: Not on file  Food Insecurity:   . Worried About Charity fundraiser in the Last Year: Not on file  . Ran Out of Food in the Last Year: Not on file  Transportation Needs:   . Lack of Transportation (Medical): Not on file  . Lack of Transportation  (Non-Medical): Not on file  Physical Activity:   . Days of Exercise per Week: Not on file  . Minutes of Exercise per Session: Not on file  Stress:   . Feeling of Stress : Not on file  Social Connections:   . Frequency of Communication with Friends and Family: Not on file  . Frequency of Social Gatherings with Friends and Family: Not on file  . Attends Religious Services: Not on file  . Active Member of Clubs or Organizations: Not on file  . Attends Archivist Meetings: Not on file  . Marital Status: Not on file   Additional Social History:                         Sleep: Fair  Appetite:  Fair  Current Medications: Current Facility-Administered Medications  Medication Dose Route Frequency Provider Last Rate Last Admin  . acetaminophen (TYLENOL) tablet 650 mg  650 mg Oral Q6H PRN [name], MD      . alum & mag hydroxide-simeth (MAALOX/MYLANTA) 200-200-20 MG/5ML suspension 30 mL  30 mL Oral Q4H PRN [name], MD      . aspirin EC tablet 81 mg  81 mg Oral Daily Annslee Tercero, [name], MD   81 mg at 03/20/20 3875  . clonazePAM (KLONOPIN) tablet 1 mg  1 mg Oral QID Senon Nixon, [name], MD   1 mg at 03/20/20 1201  . docusate sodium (COLACE) capsule 100 mg  100 mg Oral BID Draven Laine, [name], MD   100 mg at 03/20/20 6433  . gabapentin (NEURONTIN) capsule 100 mg  100 mg Oral TID Kymberley Raz, [name], MD   100 mg at 03/20/20 1201  . hydrOXYzine (ATARAX/VISTARIL) tablet 10 mg  10 mg Oral TID PRN [name], MD   10 mg at 03/16/20 1437  . lamoTRIgine (LAMICTAL) tablet 100 mg  100 mg Oral QHS Almalik Weissberg T, MD   100 mg at 03/19/20 2123  . linagliptin (TRADJENTA) tablet 5 mg  5 mg Oral Daily Solon Alban, [name], MD   5 mg at 03/20/20 2951  . loratadine (CLARITIN) tablet 10 mg  10 mg Oral Daily Winston Sobczyk, [name], MD   10 mg at 03/20/20 8841  . magnesium hydroxide (MILK OF MAGNESIA) suspension 30 mL  30 mL Oral Daily PRN [name], MD   30 mL at 03/16/20 1119  . metFORMIN  (GLUCOPHAGE) tablet 850 mg  850 mg Oral BID WC Denim Start, [name], MD   850 mg at 03/20/20 6606  . metoprolol succinate (TOPROL-XL) 24 hr tablet 50 mg  50 mg Oral Daily Kamdyn Colborn, [name], MD   50 mg at 03/20/20 3016  . oxybutynin (DITROPAN) tablet 10 mg  10 mg Oral BID Carely Nappier, [name], MD   10 mg at 03/20/20  4315  . pantoprazole (PROTONIX) EC tablet 40 mg  40 mg Oral Daily Lesley Atkin, [name], MD   40 mg at 03/20/20 0832  . prazosin (MINIPRESS) capsule 1 mg  1 mg Oral QHS Esthefany Herrig T, MD   1 mg at 03/19/20 2123  . QUEtiapine (SEROQUEL) tablet 100 mg  100 mg Oral QID Freman Lapage T, MD   100 mg at 03/20/20 1201  . QUEtiapine (SEROQUEL) tablet 400 mg  400 mg Oral QHS [name], NP   400 mg at 03/19/20 2122  . sertraline (ZOLOFT) tablet 100 mg  100 mg Oral BID Hadja Harral, [name], MD   100 mg at 03/20/20 4008  . simethicone (MYLICON) chewable tablet 80 mg  80 mg Oral TID Shaniqua Guillot, [name], MD   80 mg at 03/20/20 1201  . simvastatin (ZOCOR) tablet 20 mg  20 mg Oral q1800 Kentrel Clevenger, [name], MD   20 mg at 03/19/20 1738  . tamsulosin (FLOMAX) capsule 0.8 mg  0.8 mg Oral QPC supper Rayaan Lorah, [name], MD   0.8 mg at 03/19/20 1737    Lab Results: No results found for this or any previous visit (from the past 48 hour(s)).  Blood Alcohol level:  Lab Results  Component Value Date   ETH <10 01/22/2020   ETH <10 67/61/9509    Metabolic Disorder Labs: Lab Results  Component Value Date   HGBA1C 6.6 (H) 02/20/2020   MPG 142.72 02/20/2020   No results found for: PROLACTIN Lab Results  Component Value Date   CHOL 153 04/10/2015   TRIG 166 (H) 04/10/2015   HDL 50 04/10/2015   CHOLHDL 3.1 04/10/2015   VLDL 33 04/10/2015   LDLCALC 70 04/10/2015    Physical Findings: AIMS:  , ,  ,  ,    CIWA:    COWS:     Musculoskeletal: Strength & Muscle Tone: within normal limits Gait & Station: normal Patient leans: N/A  Psychiatric Specialty Exam: Physical Exam Vitals and nursing note reviewed.   Constitutional:      Appearance: He is well-developed.  HENT:     Head: Normocephalic and atraumatic.  Eyes:     Conjunctiva/sclera: Conjunctivae normal.     Pupils: Pupils are equal, round, and reactive to light.  Cardiovascular:     Heart sounds: Normal heart sounds.  Pulmonary:     Effort: Pulmonary effort is normal.  Abdominal:     Palpations: Abdomen is soft.  Musculoskeletal:        General: Normal range of motion.     Cervical back: Normal range of motion.  Skin:    General: Skin is warm and dry.  Neurological:     General: No focal deficit present.     Mental Status: He is alert.  Psychiatric:        Attention and Perception: Attention normal.        Mood and Affect: Mood is anxious.        Speech: Speech normal.        Behavior: Behavior is cooperative.        Thought Content: Thought content normal.        Cognition and Memory: Cognition is impaired. Memory is impaired.        Judgment: Judgment is impulsive.     Review of Systems  Constitutional: Negative.   HENT: Negative.   Eyes: Negative.   Respiratory: Negative.   Cardiovascular: Negative.   Gastrointestinal: Negative.   Musculoskeletal: Negative.   Skin:  Negative.   Neurological: Negative.   Psychiatric/Behavioral: The patient is nervous/anxious.     Blood pressure 124/74, pulse 68, temperature 98.5 F (36.9 C), temperature source Oral, resp. rate 17, height 5\' 8"  (1.727 m), weight 104.3 kg, SpO2 97 %.Body mass index is 34.96 kg/m.  General Appearance: Casual  Eye Contact:  Good  Speech:  Clear and Coherent  Volume:  Decreased  Mood:  Anxious  Affect:  Congruent  Thought Process:  Coherent  Orientation:  Full (Time, Place, and Person)  Thought Content:  Illogical, Rumination and Tangential  Suicidal Thoughts:  No  Homicidal Thoughts:  No  Memory:  Immediate;   Fair Recent;   Fair Remote;   Fair  Judgement:  Impaired  Insight:  Shallow  Psychomotor Activity:  Normal  Concentration:   Concentration: Fair  Recall:  [employer] of Knowledge:  Fair  Language:  Fair  Akathisia:  No  Handed:  Right  AIMS (if indicated):     Assets:  Desire for Improvement Resilience Social Support  ADL's:  Impaired  Cognition:  Impaired,  Mild  Sleep:  Number of Hours: 5.75     Treatment Plan Summary: Daily contact with patient to assess and evaluate symptoms and progress in treatment, Medication management and Plan Cognitive therapy done with him.  Spent some time going over his thoughts about his efforts to control his affect.  Praised a lot of his really appropriate behavior.  No change to medicine.  Continue to seek for appropriate discharge presumably to assisted living  Alethia Berthold, MD 03/20/2020, 1:46 PM

## 2020-03-21 NOTE — Progress Notes (Signed)
D: Pt alert and oriented. Pt rates depression 0/10, hopelessness 0/10, and anxiety 0/10. Pt reports energy level as normal and concentration as being good. Pt reports sleep last night as being good. Pt did not receive medications for sleep. Pt denies experiencing any pain at this time. Pt denies experiencing any SI/HI, or AVH at this time. This pt has become very agitated by another pt whom has been disruptive. With therapeutic communication used and alternative actions/routes given and found helpful to the pt. Pt is still obsessed with artwork/worksheet, however appears to be slightly less today in comparison to usual.   A: Scheduled medications administered to pt, per MD orders. Support and encouragement provided. Frequent verbal contact made. Routine safety checks conducted q15 minutes.   R: No adverse drug reactions noted. Pt verbally contracts for safety at this time. Pt complaint with medications and treatment plan. Pt interacts well with others on the unit. Pt remains safe at this time. Will continue to monitor.

## 2020-03-21 NOTE — Progress Notes (Signed)
Adult Psychoeducational Group Note  Date:  03/21/2020 Time:  9:15 PM  Group Topic/Focus:  Wrap-Up Group:   The focus of this group is to help patients review their daily goal of treatment and discuss progress on daily workbooks.  Participation Level:  Active  Participation Quality:  Appropriate  Affect:  Appropriate  Cognitive:  Appropriate  Insight: Appropriate  Engagement in Group:  Engaged  Modes of Intervention:  Discussion  Additional Comments:  Patient attended group and said that his day was a 46. He spend his day drawing, writing playing basketball and listening to music. Patient has some concerns about his placement. He said he was ready to go home and prefers to stay with his brother. Writer encouraged patient to discuss his housing issues with the Education officer, museum.   Alexander Beard W [name] 09/21/2765, 9:15 PM

## 2020-03-21 NOTE — Progress Notes (Signed)
[facility] MD Progress Note  03/21/2020 12:19 PM Alexander Beard  MRN:  [id number] Subjective: Follow-up note patient with schizoaffective disorder cognitive disability.  Upset today because another patient was disruptive on the ward and was particularly irritating to Mr. Falero.  No psychosis however no suicidal ideation no behavior problems.  Talking with him reveals in clear terms just cognitively impaired he can be at times. Principal Problem: Schizoaffective disorder, bipolar type ([place]) Diagnosis: Principal Problem:   Schizoaffective disorder, bipolar type ([place]) Active Problems:   Active autistic disorder   Diabetes ([place])   Hypertension   Prostate hypertrophy   Intellectual disability   At risk for elopement  Total Time spent with patient: 30 minutes  Past Psychiatric History: Long history of chronic disability and cognitive impairment  Past Medical History:  Past Medical History:  Diagnosis Date  . Anxiety   . Anxiety disorder due to known physiological condition    HOSPITALIZED 10/18  . Arthritis   . Autistic disorder, residual state   . COPD (chronic obstructive pulmonary disease) ([place])   . Depression   . Developmental disorder   . Diabetes mellitus without complication ([place])   . Dyslipidemia   . Esophageal reflux   . HOH (hard of hearing)    MILDLY  . Hypertension   . Obesity   . Overactive bladder   . Palpitations    ANXIETY  . Schizophrenia, schizoaffective ([place])   . Sleep apnea   . Tremors of nervous system    HANDS DUE TO MEDICATIONS  . Urinary incontinence     Past Surgical History:  Procedure Laterality Date  . CATARACT EXTRACTION W/PHACO Left 11/14/2017   Procedure: CATARACT EXTRACTION PHACO AND INTRAOCULAR LENS PLACEMENT (IOC);  Surgeon: Birder Robson, MD;  Location: [facility] ORS;  Service: Ophthalmology;  Laterality: Left;  [place] 00:42 AP% 14.6 CDE 6.12 Fluid pack lot # [id number]  . COLONOSCOPY  01/28/2005  . COLONOSCOPY WITH PROPOFOL N/A 05/09/2016    Procedure: COLONOSCOPY WITH PROPOFOL;  Surgeon: Manya Silvas, MD;  Location: [facility] ENDOSCOPY;  Service: Endoscopy;  Laterality: N/A;  . ESOPHAGOGASTRODUODENOSCOPY N/A 05/09/2016   Procedure: ESOPHAGOGASTRODUODENOSCOPY (EGD);  Surgeon: Manya Silvas, MD;  Location: [facility] ENDOSCOPY;  Service: Endoscopy;  Laterality: N/A;  . FRACTURE SURGERY     ORIF SHOULDER  . TONSILLECTOMY     Family History:  Family History  Problem Relation Age of Onset  . Hypertension Mother   . Stroke Father   . Heart Problems Father    Family Psychiatric  History: See previous Social History:  Social History   Substance and Sexual Activity  Alcohol Use No  . Alcohol/week: 0.0 standard drinks     Social History   Substance and Sexual Activity  Drug Use No    Social History   Socioeconomic History  . Marital status: Single    Spouse name: Not on file  . Number of children: Not on file  . Years of education: Not on file  . Highest education level: Not on file  Occupational History  . Not on file  Tobacco Use  . Smoking status: Never Smoker  . Smokeless tobacco: Never Used  Vaping Use  . Vaping Use: Never used  Substance and Sexual Activity  . Alcohol use: No    Alcohol/week: 0.0 standard drinks  . Drug use: No  . Sexual activity: Never  Other Topics Concern  . Not on file  Social History Narrative   The patient never finished high school but did  get his GED. 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 He is currently in disability and [name] is his legal guardian. He has been living in group homes for many years.      No pending legal charges   Social Determinants of Health   Financial Resource Strain:   . Difficulty of Paying Living Expenses: Not on file  Food Insecurity:   . Worried About Charity fundraiser in the Last Year: Not on file  . Ran Out of Food in the Last Year: Not on file  Transportation Needs:   .  Lack of Transportation (Medical): Not on file  . Lack of Transportation (Non-Medical): Not on file  Physical Activity:   . Days of Exercise per Week: Not on file  . Minutes of Exercise per Session: Not on file  Stress:   . Feeling of Stress : Not on file  Social Connections:   . Frequency of Communication with Friends and Family: Not on file  . Frequency of Social Gatherings with Friends and Family: Not on file  . Attends Religious Services: Not on file  . Active Member of Clubs or Organizations: Not on file  . Attends Archivist Meetings: Not on file  . Marital Status: Not on file   Additional Social History:                         Sleep: Fair  Appetite:  Fair  Current Medications: Current Facility-Administered Medications  Medication Dose Route Frequency Provider Last Rate Last Admin  . acetaminophen (TYLENOL) tablet 650 mg  650 mg Oral Q6H PRN [name], MD      . alum & mag hydroxide-simeth (MAALOX/MYLANTA) 200-200-20 MG/5ML suspension 30 mL  30 mL Oral Q4H PRN [name], MD      . aspirin EC tablet 81 mg  81 mg Oral Daily Domnic Vantol, [name], MD   81 mg at 03/21/20 0845  . clonazePAM (KLONOPIN) tablet 1 mg  1 mg Oral QID Yurianna Tusing, [name], MD   1 mg at 03/21/20 1202  . docusate sodium (COLACE) capsule 100 mg  100 mg Oral BID Jacolby Risby, [name], MD   100 mg at 03/21/20 0845  . gabapentin (NEURONTIN) capsule 100 mg  100 mg Oral TID Kerilyn Cortner, [name], MD   100 mg at 03/21/20 1202  . hydrOXYzine (ATARAX/VISTARIL) tablet 10 mg  10 mg Oral TID PRN [name], MD   10 mg at 03/16/20 1437  . lamoTRIgine (LAMICTAL) tablet 100 mg  100 mg Oral QHS Messiah Rovira T, MD   100 mg at 03/20/20 2121  . linagliptin (TRADJENTA) tablet 5 mg  5 mg Oral Daily Metro [place], [name], MD   5 mg at 03/21/20 0845  . loratadine (CLARITIN) tablet 10 mg  10 mg Oral Daily Kavonte Bearse, [name], MD   10 mg at 03/21/20 0846  . magnesium hydroxide (MILK OF MAGNESIA) suspension 30 mL  30 mL  Oral Daily PRN [name], MD   30 mL at 03/16/20 1119  . metFORMIN (GLUCOPHAGE) tablet 850 mg  850 mg Oral BID WC Oaklie Durrett, [name], MD   850 mg at 03/21/20 0846  . metoprolol succinate (TOPROL-XL) 24 hr tablet 50 mg  50 mg Oral Daily Jashae Wiggs, [name], MD   50 mg at 03/21/20 0853  . oxybutynin (DITROPAN) tablet 10 mg  10 mg  Oral BID Cabria Micalizzi, [name], MD   10 mg at 03/21/20 0846  . pantoprazole (PROTONIX) EC tablet 40 mg  40 mg Oral Daily Jazmon Kos, [name], MD   40 mg at 03/21/20 0845  . prazosin (MINIPRESS) capsule 1 mg  1 mg Oral QHS Matelyn Antonelli T, MD   1 mg at 03/20/20 2121  . QUEtiapine (SEROQUEL) tablet 100 mg  100 mg Oral QID Shonte Soderlund, [name], MD   100 mg at 03/21/20 1202  . QUEtiapine (SEROQUEL) tablet 400 mg  400 mg Oral QHS [name], NP   400 mg at 03/20/20 2120  . sertraline (ZOLOFT) tablet 100 mg  100 mg Oral BID Lemon Whitacre, [name], MD   100 mg at 03/21/20 0845  . simethicone (MYLICON) chewable tablet 80 mg  80 mg Oral TID Truc Winfree, [name], MD   80 mg at 03/21/20 1202  . simvastatin (ZOCOR) tablet 20 mg  20 mg Oral q1800 Eris Hannan, [name], MD   20 mg at 03/20/20 1704  . tamsulosin (FLOMAX) capsule 0.8 mg  0.8 mg Oral QPC supper Davionne Mastrangelo T, MD   0.8 mg at 03/20/20 1704    Lab Results: No results found for this or any previous visit (from the past 48 hour(s)).  Blood Alcohol level:  Lab Results  Component Value Date   ETH <10 01/22/2020   ETH <10 63/87/5643    Metabolic Disorder Labs: Lab Results  Component Value Date   HGBA1C 6.6 (H) 02/20/2020   MPG 142.72 02/20/2020   No results found for: PROLACTIN Lab Results  Component Value Date   CHOL 153 04/10/2015   TRIG 166 (H) 04/10/2015   HDL 50 04/10/2015   CHOLHDL 3.1 04/10/2015   VLDL 33 04/10/2015   LDLCALC 70 04/10/2015    Physical Findings: AIMS:  , ,  ,  ,    CIWA:    COWS:     Musculoskeletal: Strength & Muscle Tone: within normal limits Gait & Station: normal Patient leans: N/A  Psychiatric  Specialty Exam: Physical Exam Vitals and nursing note reviewed.  Constitutional:      Appearance: He is well-developed.  HENT:     Head: Normocephalic and atraumatic.  Eyes:     Conjunctiva/sclera: Conjunctivae normal.     Pupils: Pupils are equal, round, and reactive to light.  Cardiovascular:     Heart sounds: Normal heart sounds.  Pulmonary:     Effort: Pulmonary effort is normal.  Abdominal:     Palpations: Abdomen is soft.  Musculoskeletal:        General: Normal range of motion.     Cervical back: Normal range of motion.  Skin:    General: Skin is warm and dry.  Neurological:     General: No focal deficit present.     Mental Status: He is alert.  Psychiatric:        Attention and Perception: Attention normal.        Mood and Affect: Mood is anxious.        Speech: Speech is tangential.        Behavior: Behavior is agitated. Behavior is not aggressive.        Cognition and Memory: Cognition is impaired. Memory is impaired.        Judgment: Judgment is impulsive.     Review of Systems  Constitutional: Negative.   HENT: Negative.   Eyes: Negative.   Respiratory: Negative.   Cardiovascular: Negative.   Gastrointestinal: Negative.  Musculoskeletal: Negative.   Skin: Negative.   Neurological: Negative.   Psychiatric/Behavioral: Negative.     Blood pressure 128/80, pulse 76, temperature 98 F (36.7 C), temperature source Oral, resp. rate 18, height 5\' 8"  (1.727 m), weight 104.3 kg, SpO2 100 %.Body mass index is 34.96 kg/m.  General Appearance: Casual  Eye Contact:  Good  Speech:  Clear and Coherent  Volume:  Normal  Mood:  Euthymic  Affect:  Congruent  Thought Process:  Goal Directed  Orientation:  Full (Time, Place, and Person)  Thought Content:  Logical  Suicidal Thoughts:  No  Homicidal Thoughts:  No  Memory:  Immediate;   Fair Recent;   Poor Remote;   Poor  Judgement:  Impaired  Insight:  Shallow  Psychomotor Activity:  Restlessness  Concentration:   Concentration: Fair  Recall:  Poor  Fund of Knowledge:  Fair  Language:  Fair  Akathisia:  No  Handed:  Right  AIMS (if indicated):     Assets:  Desire for Improvement  ADL's:  Impaired  Cognition:  Impaired,  Mild  Sleep:  Number of Hours: 4.5     Treatment Plan Summary: Daily contact with patient to assess and evaluate symptoms and progress in treatment, Medication management and Plan Supportive therapy.  Encouragement to work on keeping his mood under control.  No indication or need to change any medicine.  Still looking into placement  Alethia Berthold, MD 03/21/2020, 12:19 PM

## 2020-03-21 NOTE — Tx Team (Signed)
Interdisciplinary Treatment and Diagnostic Plan Update  03/21/2020 Time of Session: 10:00 AM Alexander Beard MRN: [id number]  Principal Diagnosis: Schizoaffective disorder, bipolar type ([place])  Secondary Diagnoses: Principal Problem:   Schizoaffective disorder, bipolar type ([place]) Active Problems:   Active autistic disorder   Diabetes ([place])   Hypertension   Prostate hypertrophy   Intellectual disability   At risk for elopement   Current Medications:  Current Facility-Administered Medications  Medication Dose Route Frequency Provider Last Rate Last Admin  . acetaminophen (TYLENOL) tablet 650 mg  650 mg Oral Q6H PRN [name], MD      . alum & mag hydroxide-simeth (MAALOX/MYLANTA) 200-200-20 MG/5ML suspension 30 mL  30 mL Oral Q4H PRN [name], MD      . aspirin EC tablet 81 mg  81 mg Oral Daily [name], [name], MD   81 mg at 03/21/20 0845  . clonazePAM (KLONOPIN) tablet 1 mg  1 mg Oral QID [name], [name], MD   1 mg at 03/21/20 1202  . docusate sodium (COLACE) capsule 100 mg  100 mg Oral BID [name], [name], MD   100 mg at 03/21/20 0845  . gabapentin (NEURONTIN) capsule 100 mg  100 mg Oral TID [name], [name], MD   100 mg at 03/21/20 1202  . hydrOXYzine (ATARAX/VISTARIL) tablet 10 mg  10 mg Oral TID PRN [name], MD   10 mg at 03/16/20 1437  . lamoTRIgine (LAMICTAL) tablet 100 mg  100 mg Oral QHS [name], [name], MD   100 mg at 03/20/20 2121  . linagliptin (TRADJENTA) tablet 5 mg  5 mg Oral Daily [name], [name], MD   5 mg at 03/21/20 0845  . loratadine (CLARITIN) tablet 10 mg  10 mg Oral Daily [name], [name], MD   10 mg at 03/21/20 0846  . magnesium hydroxide (MILK OF MAGNESIA) suspension 30 mL  30 mL Oral Daily PRN [name], MD   30 mL at 03/16/20 1119  . metFORMIN (GLUCOPHAGE) tablet 850 mg  850 mg Oral BID WC [name], [name], MD   850 mg at 03/21/20 0846  . metoprolol succinate (TOPROL-XL) 24 hr tablet 50 mg  50 mg Oral Daily [name], [name], MD   50  mg at 03/21/20 0853  . oxybutynin (DITROPAN) tablet 10 mg  10 mg Oral BID [name], [name], MD   10 mg at 03/21/20 0846  . pantoprazole (PROTONIX) EC tablet 40 mg  40 mg Oral Daily [name], [name], MD   40 mg at 03/21/20 0845  . prazosin (MINIPRESS) capsule 1 mg  1 mg Oral QHS [name], [name], MD   1 mg at 03/20/20 2121  . QUEtiapine (SEROQUEL) tablet 100 mg  100 mg Oral QID [name], [name], MD   100 mg at 03/21/20 1202  . QUEtiapine (SEROQUEL) tablet 400 mg  400 mg Oral QHS [name], NP   400 mg at 03/20/20 2120  . sertraline (ZOLOFT) tablet 100 mg  100 mg Oral BID [name], [name], MD   100 mg at 03/21/20 0845  . simethicone (MYLICON) chewable tablet 80 mg  80 mg Oral TID [name], [name], MD   80 mg at 03/21/20 1202  . simvastatin (ZOCOR) tablet 20 mg  20 mg Oral q1800 [name], [name], MD   20 mg at 03/20/20 1704  . tamsulosin (FLOMAX) capsule 0.8 mg  0.8 mg Oral QPC supper [name], [name], MD   0.8 mg at 03/20/20 1704  PTA Medications: Medications Prior to Admission  Medication Sig Dispense Refill Last Dose  . acetaminophen (TYLENOL) 650 MG CR tablet Take 650 mg by mouth every 12 (twelve) hours as needed for pain.     Marland Kitchen aspirin EC 81 MG tablet Take 81 mg by mouth daily.     . Calcium Carbonate-Vitamin D3 (CALCIUM 600-D) 600-400 MG-UNIT TABS Take 1 tablet by mouth 2 (two) times daily.     . Cholecalciferol (VITAMIN D-1000 MAX ST) 1000 UNITS tablet Take 1,000 Units by mouth daily.      Marland Kitchen docusate sodium (COLACE) 100 MG capsule Take 100 mg by mouth daily.      . furosemide (LASIX) 20 MG tablet Take 20 mg by mouth daily.      Marland Kitchen gabapentin (NEURONTIN) 100 MG capsule Take 1 capsule (100 mg total) by mouth 3 (three) times daily. 90 capsule 10   . Insulin Detemir (LEVEMIR FLEXTOUCH) 100 UNIT/ML Pen Inject 15 Units into the skin daily at 10 pm.     . lamoTRIgine (LAMICTAL) 100 MG tablet Take 1 tablet (100 mg total) by mouth at bedtime. 30 tablet 10   . loratadine (CLARITIN) 10 MG tablet  Take 1 tablet (10 mg total) by mouth daily. 30 tablet 5   . LORazepam (ATIVAN) 1 MG tablet Take 1 tablet (1 mg total) by mouth 3 (three) times daily. 90 tablet 5   . lurasidone (LATUDA) 40 MG TABS tablet Take 1 tablet (40 mg total) by mouth daily. with food 30 tablet 10   . metoprolol succinate (TOPROL-XL) 50 MG 24 hr tablet Take 50 mg by mouth daily.      . NON FORMULARY cpap device     . oxybutynin (DITROPAN) 5 MG tablet Take 5 mg by mouth daily.     . pantoprazole (PROTONIX) 40 MG tablet Take 40 mg by mouth daily.     . polyethylene glycol (MIRALAX / GLYCOLAX) packet Take 17 g by mouth daily.     . QUEtiapine (SEROQUEL) 100 MG tablet TAKE 1 TABLET  BY MOUTH THREE TIMES A DAY (Patient taking differently: Take 100 mg by mouth 3 (three) times daily. TAKE 1 TABLET  BY MOUTH THREE TIMES A DAY) 90 tablet 11   . QUEtiapine (SEROQUEL) 400 MG tablet Take 1 tablet (400 mg total) by mouth at bedtime. 30 tablet 10   . sertraline (ZOLOFT) 100 MG tablet Take 2 tablets (200 mg total) by mouth daily. (Patient taking differently: Take 200 mg by mouth daily. Take along with two 50 mg tablets (100 mg) for total 300 mg daily) 60 tablet 5   . sertraline (ZOLOFT) 50 MG tablet Take 2 tablets (100 mg total) by mouth daily. (Patient taking differently: Take 100 mg by mouth daily. Take along with two 100 mg tablets (200 mg) for total 300 mg daily) 60 tablet 5   . simethicone (MYLICON) 80 MG chewable tablet Chew 80-160 mg by mouth 2 (two) times daily as needed for flatulence.     . simvastatin (ZOCOR) 20 MG tablet Take 20 mg by mouth at bedtime.      . sitaGLIPtin (JANUVIA) 100 MG tablet Take 100 mg by mouth daily.     . solifenacin (VESICARE) 5 MG tablet Take 5 mg by mouth daily.     . Starch (HEMORRHOIDAL RE) Place 1 application rectally 4 (four) times daily as needed (for itching).     . tamsulosin (FLOMAX) 0.4 MG CAPS capsule Take 0.4 mg by mouth daily.  Patient Stressors: Health problems Marital or family  conflict Medication change or noncompliance Traumatic event  Patient Strengths: Motivation for treatment/growth Religious Affiliation  Treatment Modalities: Medication Management, Group therapy, Case management,  1 to 1 session with clinician, Psychoeducation, Recreational therapy.   Physician Treatment Plan for Primary Diagnosis: Schizoaffective disorder, bipolar type ([place]) Long Term Goal(s): Improvement in symptoms so as ready for discharge Improvement in symptoms so as ready for discharge   Short Term Goals: Ability to verbalize feelings will improve Ability to demonstrate self-control will improve Ability to maintain clinical measurements within normal limits will improve Compliance with prescribed medications will improve  Medication Management: Evaluate patient's response, side effects, and tolerance of medication regimen.  Therapeutic Interventions: 1 to 1 sessions, Unit Group sessions and Medication administration.  Evaluation of Outcomes: Progressing  Physician Treatment Plan for Secondary Diagnosis: Principal Problem:   Schizoaffective disorder, bipolar type ([place]) Active Problems:   Active autistic disorder   Diabetes ([place])   Hypertension   Prostate hypertrophy   Intellectual disability   At risk for elopement  Long Term Goal(s): Improvement in symptoms so as ready for discharge Improvement in symptoms so as ready for discharge   Short Term Goals: Ability to verbalize feelings will improve Ability to demonstrate self-control will improve Ability to maintain clinical measurements within normal limits will improve Compliance with prescribed medications will improve     Medication Management: Evaluate patient's response, side effects, and tolerance of medication regimen.  Therapeutic Interventions: 1 to 1 sessions, Unit Group sessions and Medication administration.  Evaluation of Outcomes: Progressing   RN Treatment Plan for Primary Diagnosis: Schizoaffective  disorder, bipolar type ([place]) Long Term Goal(s): Knowledge of disease and therapeutic regimen to maintain health will improve  Short Term Goals: Ability to demonstrate self-control, Ability to participate in decision making will improve, Ability to verbalize feelings will improve, Ability to disclose and discuss suicidal ideas, Ability to identify and develop effective coping behaviors will improve and Compliance with prescribed medications will improve  Medication Management: RN will administer medications as ordered by provider, will assess and evaluate patient's response and provide education to patient for prescribed medication. RN will report any adverse and/or side effects to prescribing provider.  Therapeutic Interventions: 1 on 1 counseling sessions, Psychoeducation, Medication administration, Evaluate responses to treatment, Monitor vital signs and CBGs as ordered, Perform/monitor CIWA, COWS, AIMS and Fall Risk screenings as ordered, Perform wound care treatments as ordered.  Evaluation of Outcomes: Progressing   LCSW Treatment Plan for Primary Diagnosis: Schizoaffective disorder, bipolar type ([place]) Long Term Goal(s): Safe transition to appropriate next level of care at discharge, Engage patient in therapeutic group addressing interpersonal concerns.  Short Term Goals: Engage patient in aftercare planning with referrals and resources, Increase social support, Increase ability to appropriately verbalize feelings, Increase emotional regulation and Increase skills for wellness and recovery  Therapeutic Interventions: Assess for all discharge needs, 1 to 1 time with Social worker, Explore available resources and support systems, Assess for adequacy in community support network, Educate family and significant other(s) on suicide prevention, Complete Psychosocial Assessment, Interpersonal group therapy.  Evaluation of Outcomes: Progressing   Progress in Treatment: Attending groups:  Yes. Participating in groups: Yes. Taking medication as prescribed: Yes. Toleration medication: Yes. Family/Significant other contact made: Yes, individual(s) contacted:  [place], Brother  Patient understands diagnosis: Yes. Discussing patient identified problems/goals with staff: Yes. Medical problems stabilized or resolved: Yes. Denies suicidal/homicidal ideation: Yes. Issues/concerns per patient self-inventory: No. Other: N/a  New problem(s) identified:  No, Describe:  None  New Short Term/Long Term Goal(s): Elimination of symptoms of psychosis, medication management for mood stabilization; elimination of SI thoughts; development of comprehensive mental wellness/sobriety plan.Update 03/01/20:No changes at this time. 03/07/20: Update, no changes at this time Update 03/12/2020:  No changes at this time. Update 03/17/2020:  No changes at this time. Update 03/21/20: No changes at this time.    Patient Goals:  Patient stated he would like to go back to [place] and reconnect with his peer support. Patient also stated that he would like to increase his social support and help his brother.Update 03/07/20, no change Update 03/12/2020:  No changes at this time.  Update 03/17/2020:  No changes at this time. Update 03/21/20: No changes at this time.   Discharge Plan or Barriers: Patient has an interview with a group home on 03/09/20 at 2:00 PM. Update 03/12/2020:  CSW continues to assist the patient in developing a housing plan.   Update 03/17/2020:  CSW has assisted patient in completing an interview with a group home, however, there has been no word on if the patient has been approved.  Patient continues to struggle with his anxiety and nerves.  Nurses report that he has increased in bed wetting.  Psychiatrist and nurses believe that this may be related to his increased anxiety about finding a home. Update 03/21/20: Patient is still nervous and anxious about finding a new placement. Patient stated he could not stay  here too much longer and wanted his life back.   Reason for Continuation of Hospitalization: Anxiety Depression Medication stabilization  Estimated Length of Stay: TBD  Attendees: Patient: 03/21/2020 3:46 PM  Physician: [name], MD  03/21/2020 3:46 PM  Nursing:  03/21/2020 3:46 PM  RN Care Manager: 03/21/2020 3:46 PM  Social Worker: [name], [place] 03/21/2020 3:46 PM  Recreational Therapist:  03/21/2020 3:46 PM  Other:  03/21/2020 3:46 PM  Other:  03/21/2020 3:46 PM  Other: 03/21/2020 3:46 PM    Scribe for Treatment Team: [name], [place] 03/21/2020 3:46 PM

## 2020-03-21 NOTE — B[facility] Group Notes (Signed)
LCSW Group Therapy 03/21/20: 1:50 PM- 2:30 PM    Type of Therapy and Topic:  Group Therapy:  Setting Goals   Participation Level:  Active   Description of Group: In this process group, patients discussed using strengths to work toward goals and address challenges.  Patients identified two positive things about themselves and one goal they were working on.  Patients were given the opportunity to share openly and support each other's plan for self-empowerment.  The group discussed the value of gratitude and were encouraged to have a daily reflection of positive characteristics or circumstances.  Patients were encouraged to identify a plan to utilize their strengths to work on current challenges and goals.   Therapeutic Goals 1. Patient will verbalize personal strengths/positive qualities and relate how these can assist with achieving desired personal goals 2. Patients will verbalize affirmation of peers plans for personal change and goal setting 3. Patients will explore the value of gratitude and positive focus as related to successful achievement of goals 4. Patients will verbalize a plan for regular reinforcement of personal positive qualities and circumstances.   Summary of Patient Progress: Patient stated that he was not doing good today. Patient stated that he needs to get out of here and he can't stay here much longer. Patient stated that he wanted to get back to his peer support and [facility]. Patient stated that he can't seem to find a new home. CSW observed patient to be nervous and anxious about where he was going to go after being discharged from the hospital. Patient stated that he would like to connect with his brother and possible live with him.        Therapeutic Modalities Cognitive Behavioral Therapy Motivational Interviewing

## 2020-03-22 MED ORDER — LORAZEPAM 2 MG PO TABS
2.0000 mg | ORAL_TABLET | Freq: Four times a day (QID) | ORAL | Status: DC | PRN
  Administered 2020-03-22 – 2020-05-19 (×13): 2 mg via ORAL
  Filled 2020-03-22 (×17): qty 1

## 2020-03-22 NOTE — Progress Notes (Signed)
Pt was upset, tearful and anxious this morning first thing. Pt wants to leave but also does not want to go to a group home or "mental institution".I gave emotional support and education on short term goals etc. Collier Bullock RN

## 2020-03-22 NOTE — Plan of Care (Signed)
  Problem: Education: Goal: Knowledge of [place] [facility] Education information/materials will improve Outcome: Progressing Goal: Emotional status will improve Outcome: Progressing Goal: Mental status will improve Outcome: Progressing Goal: Verbalization of understanding the information provided will improve Outcome: Progressing   Problem: Activity: Goal: Interest or engagement in activities will improve Outcome: Progressing Goal: Sleeping patterns will improve Outcome: Progressing   

## 2020-03-22 NOTE — [facility] Group Notes (Signed)
Date/Time: 03/22/2020 1:06 PM -1:45 PM    Type of Therapy and Topic: Group Therapy: Trust and Honesty    Participation Level: Active    Description of Group:  In this group patients will be asked to explore value of being honest. Patients will be guided to discuss their thoughts, feelings, and behaviors related to honesty and trusting in others. Patients will process together how trust and honesty relate to how we form relationships with peers, family members, and self. Each patient will be challenged to identify and express feelings of being vulnerable. Patients will discuss reasons why people are dishonest and identify alternative outcomes if one was truthful (to self or others). This group will be process-oriented, with patients participating in exploration of their own experiences as well as giving and receiving support and challenge from other group members.    Therapeutic Goals:  1. Patient will identify why honesty is important to relationships and how honesty overall affects relationships.  2. Patient will identify a situation where they lied or were lied too and the feelings, thought process, and behaviors surrounding the situation  3. Patient will identify the meaning of being vulnerable, how that feels, and how that correlates to being honest with self and others.  4. Patient will identify situations where they could have told the truth, but instead lied and explain reasons of dishonesty.      Summary of Patient Progress: Patient checked into group not feeling good. Patient appeared to be nervous and anxious. CSW asked patient why is it important to be honest. Patient stated that if your in trouble the person trying to help you might not believe you. Patient spoke about [facility] and wanted to know if CSW new [place]. Patient stated he has tried to call [facility] multiple times and he has not been able to talk with her. Patient stated he would like to have his peer support again so he can get out in  the community. Patient also let CSW know that he has made some mistakes in his past group home.     Therapeutic Modalities:  Cognitive Behavioral Therapy  Solution Focused Therapy  Motivational Interviewing  Brief Therapy     [name], Latanya Presser   03/22/2020

## 2020-03-22 NOTE — Progress Notes (Signed)
Alexander Beard [facility] MD Progress Note  03/22/2020 11:51 AM Alexander Beard  MRN:  [id number] Subjective: Follow-up for patient with schizoaffective disorder versus chronic intellectual disability and worsening dementia.  As noted previously he had been doing okay for a while but decompensated a couple days ago probably in part because of a lot of uproar on the unit related to a manic patient.  He is staying agitated and today talked at me for almost 1/2 an hour about his usual complaints of wanting to have more freedom when he is discharged.  He escalated this and called his brother on the phone and got even more agitated when his brother told him that he could not come and live with him.  This by the way was certainly no surprise that has never been an option for Alexander Beard's discharge plan.  He is not physically aggressive to anyone else not fighting not suicidal but he is loud now and crying and agitated. Principal Problem: Schizoaffective disorder, bipolar type ([name]) Diagnosis: Principal Problem:   Schizoaffective disorder, bipolar type ([name]) Active Problems:   Active autistic disorder   Diabetes ([place])   Hypertension   Prostate hypertrophy   Intellectual disability   At risk for elopement  Total Time spent with patient: 30 minutes  Past Psychiatric History: Patient has a long history of behavior problems related to developmental disability  Past Medical History:  Past Medical History:  Diagnosis Date  . Anxiety   . Anxiety disorder due to known physiological condition    HOSPITALIZED 10/18  . Arthritis   . Autistic disorder, residual state   . COPD (chronic obstructive pulmonary disease) ([place])   . Depression   . Developmental disorder   . Diabetes mellitus without complication ([place])   . Dyslipidemia   . Esophageal reflux   . HOH (hard of hearing)    MILDLY  . Hypertension   . Obesity   . Overactive bladder   . Palpitations    ANXIETY  . Schizophrenia, schizoaffective ([place])   . Sleep apnea   .  Tremors of nervous system    HANDS DUE TO MEDICATIONS  . Urinary incontinence     Past Surgical History:  Procedure Laterality Date  . CATARACT EXTRACTION W/PHACO Left 11/14/2017   Procedure: CATARACT EXTRACTION PHACO AND INTRAOCULAR LENS PLACEMENT (IOC);  Surgeon: Birder Robson, MD;  Location: [facility] ORS;  Service: Ophthalmology;  Laterality: Left;  [place] 00:42 AP% 14.6 CDE 6.12 Fluid pack lot # [id number]  . COLONOSCOPY  01/28/2005  . COLONOSCOPY WITH PROPOFOL N/A 05/09/2016   Procedure: COLONOSCOPY WITH PROPOFOL;  Surgeon: Manya Silvas, MD;  Location: [facility] ENDOSCOPY;  Service: Endoscopy;  Laterality: N/A;  . ESOPHAGOGASTRODUODENOSCOPY N/A 05/09/2016   Procedure: ESOPHAGOGASTRODUODENOSCOPY (EGD);  Surgeon: Manya Silvas, MD;  Location: [facility] ENDOSCOPY;  Service: Endoscopy;  Laterality: N/A;  . FRACTURE SURGERY     ORIF SHOULDER  . TONSILLECTOMY     Family History:  Family History  Problem Relation Age of Onset  . Hypertension Mother   . Stroke Father   . Heart Problems Father    Family Psychiatric  History: See previous Social History:  Social History   Substance and Sexual Activity  Alcohol Use No  . Alcohol/week: 0.0 standard drinks     Social History   Substance and Sexual Activity  Drug Use No    Social History   Socioeconomic History  . Marital status: Single    Spouse name: Not on file  . Number of children: Not  on file  . Years of education: Not on file  . Highest education level: Not on file  Occupational History  . Not on file  Tobacco Use  . Smoking status: Never Smoker  . Smokeless tobacco: Never Used  Vaping Use  . Vaping Use: Never used  Substance and Sexual Activity  . Alcohol use: No    Alcohol/week: 0.0 standard drinks  . Drug use: No  . Sexual activity: Never  Other Topics Concern  . Not on file  Social History Narrative   The patient never finished high school but did get his GED. He works in the past as a Museum/gallery conservator.  He has never been married and has no children. He is currently in disability and [name] is his legal guardian. He has been living in group homes for many years.      No pending legal charges   Social Determinants of Health   Financial Resource Strain:   . Difficulty of Paying Living Expenses: Not on file  Food Insecurity:   . Worried About Charity fundraiser in the Last Year: Not on file  . Ran Out of Food in the Last Year: Not on file  Transportation Needs:   . Lack of Transportation (Medical): Not on file  . Lack of Transportation (Non-Medical): Not on file  Physical Activity:   . Days of Exercise per Week: Not on file  . Minutes of Exercise per Session: Not on file  Stress:   . Feeling of Stress : Not on file  Social Connections:   . Frequency of Communication with Friends and Family: Not on file  . Frequency of Social Gatherings with Friends and Family: Not on file  . Attends Religious Services: Not on file  . Active Member of Clubs or Organizations: Not on file  . Attends Archivist Meetings: Not on file  . Marital Status: Not on file   Additional Social History:                         Sleep: Fair  Appetite:  Fair  Current Medications: Current Facility-Administered Medications  Medication Dose Route Frequency Provider Last Rate Last Admin  . acetaminophen (TYLENOL) tablet 650 mg  650 mg Oral Q6H PRN [name], MD      . alum & mag hydroxide-simeth (MAALOX/MYLANTA) 200-200-20 MG/5ML suspension 30 mL  30 mL Oral Q4H PRN [name], MD      . aspirin EC tablet 81 mg  81 mg Oral Daily Maicee Ullman, [name], MD   81 mg at 03/22/20 0740  . clonazePAM (KLONOPIN) tablet 1 mg  1 mg Oral QID Marcanthony Sleight, [name], MD   1 mg at 03/22/20 1127  . docusate sodium (COLACE) capsule 100 mg  100 mg Oral BID Catelynn Sparger, [name], MD   100 mg at 03/22/20 0739  . gabapentin (NEURONTIN) capsule 100 mg  100 mg Oral TID Marley Charlot T, MD   100 mg at 03/22/20  1128  . hydrOXYzine (ATARAX/VISTARIL) tablet 10 mg  10 mg Oral TID PRN [name], MD   10 mg at 03/16/20 1437  . lamoTRIgine (LAMICTAL) tablet 100 mg  100 mg Oral QHS Synethia Endicott T, MD   100 mg at 03/21/20 2110  . linagliptin (TRADJENTA) tablet 5 mg  5 mg Oral Daily Montrae Braithwaite, [name], MD   5 mg at 03/22/20 0740  . loratadine (CLARITIN) tablet 10 mg  10  mg Oral Daily Sahand Gosch, [name], MD   10 mg at 03/22/20 0740  . LORazepam (ATIVAN) tablet 2 mg  2 mg Oral Q6H PRN Arvid Marengo, [name], MD   2 mg at 03/22/20 1130  . magnesium hydroxide (MILK OF MAGNESIA) suspension 30 mL  30 mL Oral Daily PRN [name], MD   30 mL at 03/16/20 1119  . metFORMIN (GLUCOPHAGE) tablet 850 mg  850 mg Oral BID WC Favio Moder, [name], MD   850 mg at 03/22/20 0741  . metoprolol succinate (TOPROL-XL) 24 hr tablet 50 mg  50 mg Oral Daily Simran Mannis, [name], MD   50 mg at 03/22/20 0742  . oxybutynin (DITROPAN) tablet 10 mg  10 mg Oral BID Silvana Holecek, [name], MD   10 mg at 03/22/20 0741  . pantoprazole (PROTONIX) EC tablet 40 mg  40 mg Oral Daily Evely Gainey, [name], MD   40 mg at 03/22/20 0740  . prazosin (MINIPRESS) capsule 1 mg  1 mg Oral QHS Sterling Ucci T, MD   1 mg at 03/20/20 2121  . QUEtiapine (SEROQUEL) tablet 100 mg  100 mg Oral QID Shequila Neglia T, MD   100 mg at 03/22/20 1127  . QUEtiapine (SEROQUEL) tablet 400 mg  400 mg Oral QHS [name], NP   400 mg at 03/21/20 2110  . sertraline (ZOLOFT) tablet 100 mg  100 mg Oral BID Melisse Caetano, [name], MD   100 mg at 03/22/20 0739  . simethicone (MYLICON) chewable tablet 80 mg  80 mg Oral TID Rocsi Hazelbaker, [name], MD   80 mg at 03/22/20 1128  . simvastatin (ZOCOR) tablet 20 mg  20 mg Oral q1800 Mirha Brucato T, MD   20 mg at 03/21/20 1710  . tamsulosin (FLOMAX) capsule 0.8 mg  0.8 mg Oral QPC supper Havah Ammon, [name], MD   0.8 mg at 03/21/20 1709    Lab Results: No results found for this or any previous visit (from the past 48 hour(s)).  Blood Alcohol level:  Lab Results   Component Value Date   ETH <10 01/22/2020   ETH <10 62/95/2841    Metabolic Disorder Labs: Lab Results  Component Value Date   HGBA1C 6.6 (H) 02/20/2020   MPG 142.72 02/20/2020   No results found for: PROLACTIN Lab Results  Component Value Date   CHOL 153 04/10/2015   TRIG 166 (H) 04/10/2015   HDL 50 04/10/2015   CHOLHDL 3.1 04/10/2015   VLDL 33 04/10/2015   LDLCALC 70 04/10/2015    Physical Findings: AIMS:  , ,  ,  ,    CIWA:    COWS:     Musculoskeletal: Strength & Muscle Tone: within normal limits Gait & Station: normal Patient leans: N/A  Psychiatric Specialty Exam: Physical Exam Vitals and nursing note reviewed.  Constitutional:      Appearance: He is well-developed.  HENT:     Head: Normocephalic and atraumatic.  Eyes:     Conjunctiva/sclera: Conjunctivae normal.     Pupils: Pupils are equal, round, and reactive to light.  Cardiovascular:     Heart sounds: Normal heart sounds.  Pulmonary:     Effort: Pulmonary effort is normal.  Abdominal:     Palpations: Abdomen is soft.  Musculoskeletal:        General: Normal range of motion.     Cervical back: Normal range of motion.  Skin:    General: Skin is warm and dry.  Neurological:     General: No  focal deficit present.     Mental Status: He is alert.  Psychiatric:        Attention and Perception: He is inattentive.        Mood and Affect: Mood is anxious. Affect is labile and inappropriate.        Speech: Speech is rapid and pressured.        Behavior: Behavior is agitated. Behavior is not aggressive.        Thought Content: Thought content does not include homicidal or suicidal ideation.        Cognition and Memory: Cognition is impaired. Memory is impaired.        Judgment: Judgment is impulsive and inappropriate.     Review of Systems  Constitutional: Negative.   HENT: Negative.   Eyes: Negative.   Respiratory: Negative.   Cardiovascular: Negative.   Gastrointestinal: Negative.    Musculoskeletal: Negative.   Skin: Negative.   Neurological: Negative.   Psychiatric/Behavioral: Positive for behavioral problems and dysphoric mood. The patient is nervous/anxious.     Blood pressure 119/78, pulse 63, temperature 98 F (36.7 C), temperature source Oral, resp. rate 18, height 5\' 8"  (1.727 m), weight 104.3 kg, SpO2 98 %.Body mass index is 34.96 kg/m.  General Appearance: Casual  Eye Contact:  Fair  Speech:  Pressured  Volume:  Increased  Mood:  Angry and Irritable  Affect:  Inappropriate  Thought Process:  Disorganized  Orientation:  Full (Time, Place, and Person)  Thought Content:  Illogical, Rumination and Tangential  Suicidal Thoughts:  No  Homicidal Thoughts:  No  Memory:  Immediate;   Fair Recent;   Poor Remote;   Poor  Judgement:  Impaired  Insight:  Shallow  Psychomotor Activity:  Restlessness  Concentration:  Concentration: Poor  Recall:  Poor  Fund of Knowledge:  Fair  Language:  Poor  Akathisia:  No  Handed:  Right  AIMS (if indicated):     Assets:  Desire for Improvement  ADL's:  Impaired  Cognition:  Impaired,  Mild  Sleep:  Number of Hours: 5.75     Treatment Plan Summary: Daily contact with patient to assess and evaluate symptoms and progress in treatment, Medication management and Plan Worsening agitation.  It comes and goes as often is the case with people with developmental disabilities often getting worse when things are frustrating.  Worked on trying to do some supportive counseling and reassurance.  Added some as needed Ativan as he is hard to calm down with just support and redirection.  Alethia Berthold, MD 03/22/2020, 11:51 AM

## 2020-03-22 NOTE — Progress Notes (Signed)
D- Patient alert and oriented. Affect/mood was anxious this morning but calm this afternoon. Pt denies SI, HI, AVH, and pain. Pt has been social, attended group, went outside and cooperative.   A- Scheduled medications administered to patient, per MD orders. Support and encouragement provided.  Routine safety checks conducted every 15 minutes.  Patient informed to notify staff with problems or concerns.  R- No adverse drug reactions noted. Patient contracts for safety at this time. Patient compliant with medications and treatment plan. Patient receptive, calm, and cooperative. Patient interacts well with others on the unit.  Patient remains safe at this time.  Collier Bullock RN

## 2020-03-22 NOTE — Progress Notes (Signed)
Patient's night-time minipress held for pulse of 52. Patient is upset, says he is "not having a good day". Says he has been here too long and he does not want to spend another day here. He wants to go and live with his brother Alexander Beard. He says his brother needs his help. Says that his brother lives in [place], [place] and works in [place], [place] which is a long commute for him. He does not want to go back to another group home. He wants to go home to where he can have some freedom. Denies SI, HI and AVH. Continues to spend much of his time drawing in the dayroom, which is soothing for him.

## 2020-03-22 NOTE — Plan of Care (Signed)
Pt rates depression and anxiety both 10/10 and denies SI, HI and AVH. Pt was educated on care plan and verbalizes understanding. Pt was encouraged to attend groups. Collier Bullock RN Problem: Education: Goal: Knowledge of [place] [facility] Education information/materials will improve Outcome: Progressing Goal: Emotional status will improve Outcome: Progressing Goal: Mental status will improve Outcome: Progressing Goal: Verbalization of understanding the information provided will improve Outcome: Progressing   Problem: Activity: Goal: Interest or engagement in activities will improve Outcome: Progressing Goal: Sleeping patterns will improve Outcome: Progressing   Problem: Coping: Goal: Ability to verbalize frustrations and anger appropriately will improve Outcome: Progressing Goal: Ability to demonstrate self-control will improve Outcome: Progressing   Problem: Health Behavior/Discharge Planning: Goal: Identification of resources available to assist in meeting health care needs will improve Outcome: Progressing Goal: Compliance with treatment plan for underlying cause of condition will improve Outcome: Progressing   Problem: Physical Regulation: Goal: Ability to maintain clinical measurements within normal limits will improve Outcome: Progressing   Problem: Safety: Goal: Periods of time without injury will increase Outcome: Progressing   Problem: Education: Goal: Ability to make informed decisions regarding treatment will improve Outcome: Progressing   Problem: Coping: Goal: Coping ability will improve Outcome: Progressing   Problem: Health Behavior/Discharge Planning: Goal: Identification of resources available to assist in meeting health care needs will improve Outcome: Progressing   Problem: Self-Concept: Goal: Ability to disclose and discuss suicidal ideas will improve Outcome: Progressing Goal: Will verbalize positive feelings about self Outcome: Progressing    Problem: Medication: Goal: Compliance with prescribed medication regimen will improve Outcome: Progressing

## 2020-03-23 NOTE — BHH Counselor (Signed)
CSW followed up on the following group home referrals:  [facility], [phone] CSW spoke with Seward Carol, who had the patient confused with someone else.  He requested that information be re-faxed.  CSW resent the information and will follow up.  [facility], [phone] CSW left HIPAA compliant voicemail.   [facility], [place], [phone] Declined patient at this time.   [facility], [phone] CSW unable to speak to someone or leave HIPAA compliant voicemail as the box was full.  CSW did receive a return call from Mr. Cletus Gash who reports that he has not reviewed it but he will and call this CSW back.  [facility], [place] Line rang without the option of leaving a voicemail.   [facility], [place], [phone] Declined patient due to history of wandering.   [facility] [phone] Line rang then began a noise like a fax machine.    [place] [facility] [phone]  CSW left HIPAA compliant voicemail.  [facility] [phone] -  CSW called and spoke with staff who requested that CSW call Damien Fusi at [phone], this is the same number CSW previously called and left a HIPAA compliant voicemail.   [facility] [phone] -  CSW spoke with staff member who reports that there is a bed available, however, pt would not be a "good fit, he doesn't sound like someone we would take" when CSW described the patient's behaviors leading up to his need for placement.   [facility] [phone] - CSW left HIPAA compliant voicemail.    [facility] [phone] Referral is still "in the review process" per staff member.   Assunta Curtis, MSW, LCSW 03/23/2020 11:44 AM

## 2020-03-23 NOTE — BHH Counselor (Signed)
CSW refaxed the [facility] due to changes in appropriate placement.   Assunta Curtis, MSW, LCSW 03/23/2020 10:40 AM

## 2020-03-23 NOTE — BHH Counselor (Signed)
CSW spoke with [place] at [facility].  She reports that they are working to get the patient on the registry list for stat funded group homes.  She reports that she needs to follow up with the staff person working with the patient and will cal this CSW back.   Assunta Curtis, MSW, LCSW 03/23/2020 4:00 PM

## 2020-03-23 NOTE — Plan of Care (Signed)
Pt complains of depression, anxiety, SI, HI and AVH. Pt educated on care plan and verbalizes understanding. Pt was educated on care plan and verbalizes understanding. Collier Bullock RN Problem: Coping: Goal: Ability to verbalize frustrations and anger appropriately will improve Outcome: Progressing Goal: Ability to demonstrate self-control will improve Outcome: Progressing   Problem: Health Behavior/Discharge Planning: Goal: Identification of resources available to assist in meeting health care needs will improve Outcome: Progressing Goal: Compliance with treatment plan for underlying cause of condition will improve Outcome: Progressing   Problem: Physical Regulation: Goal: Ability to maintain clinical measurements within normal limits will improve Outcome: Progressing   Problem: Safety: Goal: Periods of time without injury will increase Outcome: Progressing   Problem: Education: Goal: Ability to make informed decisions regarding treatment will improve Outcome: Progressing   Problem: Coping: Goal: Coping ability will improve Outcome: Progressing   Problem: Health Behavior/Discharge Planning: Goal: Identification of resources available to assist in meeting health care needs will improve Outcome: Progressing   Problem: Health Behavior/Discharge Planning: Goal: Identification of resources available to assist in meeting health care needs will improve Outcome: Progressing   Problem: Education: Goal: Knowledge of [place] [facility] Education information/materials will improve Outcome: Progressing Goal: Emotional status will improve Outcome: Progressing Goal: Mental status will improve Outcome: Progressing Goal: Verbalization of understanding the information provided will improve Outcome: Progressing   Problem: Activity: Goal: Interest or engagement in activities will improve Outcome: Progressing Goal: Sleeping patterns will improve Outcome: Progressing

## 2020-03-23 NOTE — BHH Counselor (Signed)
CSW called [employer] to make a referral for Care Coordination.  CSW spoke with Kimyatta.    Per Frederich Chick she will have to call this CSW back because the patient being homeless and his own guardian "changes things".   Assunta Curtis, MSW, LCSW 03/23/2020 1:52 PM

## 2020-03-23 NOTE — Plan of Care (Signed)
  Problem: Education: Goal: Knowledge of [facility] Education information/materials will improve Outcome: Progressing Goal: Emotional status will improve Outcome: Progressing Goal: Mental status will improve Outcome: Progressing Goal: Verbalization of understanding the information provided will improve Outcome: Progressing   Problem: Activity: Goal: Interest or engagement in activities will improve Outcome: Progressing Goal: Sleeping patterns will improve Outcome: Progressing   

## 2020-03-23 NOTE — Progress Notes (Signed)
Patient has been sad and anxious. Repeating over and over that he wants to live with his brother, he does not want to go to a group home, that he wants his freedom and is tired of being here. Expressed concern that his social worker had told him his past behaviors were keeping a lot of group homes from being willing to take him. He says he wants to have a meeting with his brother Alexander Beard, his niece and a therapist in the hopes that he can change his brother's mind about letting him come to live with  Him. "My brother needs me". Denies SI, HI and AVH. Patient is disheveled. Hygiene is poor.

## 2020-03-23 NOTE — Progress Notes (Signed)
Patient presents with more anxiety this evening and had to be redirected by staff several times. Patient is upset that his brother won't let him come stay with him. Patient given education, support, and encouragement to be active in his treatment plan. Patient compliant with medication administration per MD orders. Patient being monitored Q 15 minutes for safety per unit protocol. Pt remains safe on the unit.

## 2020-03-23 NOTE — Progress Notes (Signed)
Recreation Therapy Notes   Date: 03/23/2020  Time: 9:30 am   Location: Craft room  Behavioral response: Appropriate  Intervention Topic: Happiness   Discussion/Intervention:  Group content today was focused on Happiness. The group defined happiness and described where happiness comes from. Individuals identified what makes them happy and how they go about making others happy. Patients expressed things that stop them from being happy and ways they can improve their happiness. The group stated reasons why it is important to be happy. The group participated in the intervention "My Happiness", where they had a chance to identify and express things that make them happy.  Clinical Observations/Feedback:  Patient came to group and defined happiness as personal experiences. He stated that his artwork, having freedom and talking to others makes him happy. Participant explained that being in the hospital is making him unhappy and he would like to find a home and get out of here. Individual was social with peers and staff while participating in the intervention.  Neri Vieyra LRT/CTRS         Joslynn Jamroz 03/23/2020 12:08 PM

## 2020-03-23 NOTE — Progress Notes (Signed)
[facility] MD Progress Note  03/23/2020 3:37 PM Alexander Beard  MRN:  [id number] Subjective: Follow-up for 64 year old man with schizoaffective disorder developmental disability.  No new complaints today.  He has calm down his anxiety quite a bit.  Less agitated and anxious today.  No behavior problems.  No new physical complaints Principal Problem: Schizoaffective disorder, bipolar type ([place]) Diagnosis: Principal Problem:   Schizoaffective disorder, bipolar type ([place]) Active Problems:   Active autistic disorder   Diabetes ([place])   Hypertension   Prostate hypertrophy   Intellectual disability   At risk for elopement  Total Time spent with patient: 30 minutes  Past Psychiatric History: Past history of longstanding disability  Past Medical History:  Past Medical History:  Diagnosis Date  . Anxiety   . Anxiety disorder due to known physiological condition    HOSPITALIZED 10/18  . Arthritis   . Autistic disorder, residual state   . COPD (chronic obstructive pulmonary disease) ([place])   . Depression   . Developmental disorder   . Diabetes mellitus without complication ([place])   . Dyslipidemia   . Esophageal reflux   . HOH (hard of hearing)    MILDLY  . Hypertension   . Obesity   . Overactive bladder   . Palpitations    ANXIETY  . Schizophrenia, schizoaffective ([place])   . Sleep apnea   . Tremors of nervous system    HANDS DUE TO MEDICATIONS  . Urinary incontinence     Past Surgical History:  Procedure Laterality Date  . CATARACT EXTRACTION W/PHACO Left 11/14/2017   Procedure: CATARACT EXTRACTION PHACO AND INTRAOCULAR LENS PLACEMENT (IOC);  Surgeon: Birder Robson, MD;  Location: [facility] ORS;  Service: Ophthalmology;  Laterality: Left;  [place] 00:42 AP% 14.6 CDE 6.12 Fluid pack lot # [id number]  . COLONOSCOPY  01/28/2005  . COLONOSCOPY WITH PROPOFOL N/A 05/09/2016   Procedure: COLONOSCOPY WITH PROPOFOL;  Surgeon: Manya Silvas, MD;  Location: [facility] ENDOSCOPY;  Service: Endoscopy;   Laterality: N/A;  . ESOPHAGOGASTRODUODENOSCOPY N/A 05/09/2016   Procedure: ESOPHAGOGASTRODUODENOSCOPY (EGD);  Surgeon: Manya Silvas, MD;  Location: [facility] ENDOSCOPY;  Service: Endoscopy;  Laterality: N/A;  . FRACTURE SURGERY     ORIF SHOULDER  . TONSILLECTOMY     Family History:  Family History  Problem Relation Age of Onset  . Hypertension Mother   . Stroke Father   . Heart Problems Father    Family Psychiatric  History: See previous.  Nonspecific Social History:  Social History   Substance and Sexual Activity  Alcohol Use No  . Alcohol/week: 0.0 standard drinks     Social History   Substance and Sexual Activity  Drug Use No    Social History   Socioeconomic History  . Marital status: Single    Spouse name: Not on file  . Number of children: Not on file  . Years of education: Not on file  . Highest education level: Not on file  Occupational History  . Not on file  Tobacco Use  . Smoking status: Never Smoker  . Smokeless tobacco: Never Used  Vaping Use  . Vaping Use: Never used  Substance and Sexual Activity  . Alcohol use: No    Alcohol/week: 0.0 standard drinks  . Drug use: No  . Sexual activity: Never  Other Topics Concern  . Not on file  Social History Narrative   The patient never finished high school but did get his GED. He works in the past as a Museum/gallery conservator. He  has never been married and has no children. He is currently in disability and [name] is his legal guardian. He has been living in group homes for many years.      No pending legal charges   Social Determinants of Health   Financial Resource Strain:   . Difficulty of Paying Living Expenses: Not on file  Food Insecurity:   . Worried About Charity fundraiser in the Last Year: Not on file  . Ran Out of Food in the Last Year: Not on file  Transportation Needs:   . Lack of Transportation (Medical): Not on file  . Lack of Transportation (Non-Medical): Not on file   Physical Activity:   . Days of Exercise per Week: Not on file  . Minutes of Exercise per Session: Not on file  Stress:   . Feeling of Stress : Not on file  Social Connections:   . Frequency of Communication with Friends and Family: Not on file  . Frequency of Social Gatherings with Friends and Family: Not on file  . Attends Religious Services: Not on file  . Active Member of Clubs or Organizations: Not on file  . Attends Archivist Meetings: Not on file  . Marital Status: Not on file   Additional Social History:                         Sleep: Fair  Appetite:  Fair  Current Medications: Current Facility-Administered Medications  Medication Dose Route Frequency Provider Last Rate Last Admin  . acetaminophen (TYLENOL) tablet 650 mg  650 mg Oral Q6H PRN [name], MD      . alum & mag hydroxide-simeth (MAALOX/MYLANTA) 200-200-20 MG/5ML suspension 30 mL  30 mL Oral Q4H PRN [name], MD      . aspirin EC tablet 81 mg  81 mg Oral Daily Kecia Swoboda, [name], MD   81 mg at 03/23/20 0829  . clonazePAM (KLONOPIN) tablet 1 mg  1 mg Oral QID Valeria Krisko, [name], MD   1 mg at 03/23/20 1250  . docusate sodium (COLACE) capsule 100 mg  100 mg Oral BID Euclide Granito, [name], MD   100 mg at 03/23/20 0829  . gabapentin (NEURONTIN) capsule 100 mg  100 mg Oral TID Dilyn Osoria T, MD   100 mg at 03/23/20 1250  . hydrOXYzine (ATARAX/VISTARIL) tablet 10 mg  10 mg Oral TID PRN [name], MD   10 mg at 03/16/20 1437  . lamoTRIgine (LAMICTAL) tablet 100 mg  100 mg Oral QHS Akeyla Molden T, MD   100 mg at 03/22/20 2129  . linagliptin (TRADJENTA) tablet 5 mg  5 mg Oral Daily Brinlynn Gorton, [name], MD   5 mg at 03/23/20 0829  . loratadine (CLARITIN) tablet 10 mg  10 mg Oral Daily Glenn Gullickson, [name], MD   10 mg at 03/23/20 0830  . LORazepam (ATIVAN) tablet 2 mg  2 mg Oral Q6H PRN Ezell Melikian, [name], MD   2 mg at 03/23/20 3299  . magnesium hydroxide (MILK OF MAGNESIA) suspension 30 mL  30 mL Oral  Daily PRN [name], MD   30 mL at 03/23/20 1344  . metFORMIN (GLUCOPHAGE) tablet 850 mg  850 mg Oral BID WC Aalaya Yadao, [name], MD   850 mg at 03/23/20 0828  . metoprolol succinate (TOPROL-XL) 24 hr tablet 50 mg  50 mg Oral Daily Star Cheese T, MD   50 mg at 03/23/20 0830  .  oxybutynin (DITROPAN) tablet 10 mg  10 mg Oral BID Carolyn Maniscalco, [name], MD   10 mg at 03/23/20 0830  . pantoprazole (PROTONIX) EC tablet 40 mg  40 mg Oral Daily Dyane Broberg, [name], MD   40 mg at 03/23/20 0830  . prazosin (MINIPRESS) capsule 1 mg  1 mg Oral QHS Shanikqua Zarzycki T, MD   1 mg at 03/20/20 2121  . QUEtiapine (SEROQUEL) tablet 100 mg  100 mg Oral QID Quanasia Defino T, MD   100 mg at 03/23/20 1250  . QUEtiapine (SEROQUEL) tablet 400 mg  400 mg Oral QHS [name], NP   400 mg at 03/22/20 2128  . sertraline (ZOLOFT) tablet 100 mg  100 mg Oral BID Keaira Whitehurst, [name], MD   100 mg at 03/23/20 0829  . simethicone (MYLICON) chewable tablet 80 mg  80 mg Oral TID Estanislado Surgeon, [name], MD   80 mg at 03/23/20 1250  . simvastatin (ZOCOR) tablet 20 mg  20 mg Oral q1800 Arrietty Dercole, [name], MD   20 mg at 03/22/20 1729  . tamsulosin (FLOMAX) capsule 0.8 mg  0.8 mg Oral QPC supper Rashad Auld, [name], MD   0.8 mg at 03/22/20 1730    Lab Results: No results found for this or any previous visit (from the past 48 hour(s)).  Blood Alcohol level:  Lab Results  Component Value Date   ETH <10 01/22/2020   ETH <10 94/17/4081    Metabolic Disorder Labs: Lab Results  Component Value Date   HGBA1C 6.6 (H) 02/20/2020   MPG 142.72 02/20/2020   No results found for: PROLACTIN Lab Results  Component Value Date   CHOL 153 04/10/2015   TRIG 166 (H) 04/10/2015   HDL 50 04/10/2015   CHOLHDL 3.1 04/10/2015   VLDL 33 04/10/2015   LDLCALC 70 04/10/2015    Physical Findings: AIMS:  , ,  ,  ,    CIWA:    COWS:     Musculoskeletal: Strength & Muscle Tone: within normal limits Gait & Station: normal Patient leans: N/A  Psychiatric  Specialty Exam: Physical Exam Vitals and nursing note reviewed.  Constitutional:      Appearance: He is well-developed.  HENT:     Head: Normocephalic and atraumatic.  Eyes:     Conjunctiva/sclera: Conjunctivae normal.     Pupils: Pupils are equal, round, and reactive to light.  Cardiovascular:     Heart sounds: Normal heart sounds.  Pulmonary:     Effort: Pulmonary effort is normal.  Abdominal:     Palpations: Abdomen is soft.  Musculoskeletal:        General: Normal range of motion.     Cervical back: Normal range of motion.  Skin:    General: Skin is warm and dry.  Neurological:     General: No focal deficit present.     Mental Status: He is alert.  Psychiatric:        Attention and Perception: He is inattentive.        Mood and Affect: Mood is anxious.        Speech: Speech normal.        Behavior: Behavior is cooperative.        Thought Content: Thought content normal.        Cognition and Memory: Cognition is impaired.        Judgment: Judgment is impulsive.     Review of Systems  Constitutional: Negative.   HENT: Negative.   Eyes: Negative.  Respiratory: Negative.   Cardiovascular: Negative.   Gastrointestinal: Negative.   Musculoskeletal: Negative.   Skin: Negative.   Neurological: Negative.   Psychiatric/Behavioral: Negative.     Blood pressure 121/72, pulse (!) 53, temperature 98.2 F (36.8 C), temperature source Oral, resp. rate 18, height 5\' 8"  (1.727 m), weight 104.3 kg, SpO2 99 %.Body mass index is 34.96 kg/m.  General Appearance: Casual  Eye Contact:  Good  Speech:  Clear and Coherent  Volume:  Normal  Mood:  Euthymic  Affect:  Constricted  Thought Process:  Coherent  Orientation:  Full (Time, Place, and Person)  Thought Content:  Logical  Suicidal Thoughts:  No  Homicidal Thoughts:  No  Memory:  Immediate;   Fair Recent;   Fair Remote;   Fair  Judgement:  Impaired  Insight:  Shallow  Psychomotor Activity:  Decreased  Concentration:   Concentration: Poor  Recall:  [employer] of Knowledge:  Fair  Language:  Fair  Akathisia:  No  Handed:  Right  AIMS (if indicated):     Assets:  Desire for Improvement Resilience Social Support  ADL's:  Impaired  Cognition:  Impaired,  Mild  Sleep:  Number of Hours: 6     Treatment Plan Summary: Daily contact with patient to assess and evaluate symptoms and progress in treatment, Medication management and Plan No change to medication management.  Supportive therapy and counseling.  Encourage group attendance.  Praise his coping skills.  Working on discharge [place], MD 03/23/2020, 3:37 PM

## 2020-03-23 NOTE — BHH Counselor (Signed)
CSW spoke with the patient's brother about if previous placement assisted with locating housing for the patient.    Per the patients brother, "early in the 30 days" the group home provided "a list of about 4 names, that I contacted".  Brother reports that the patients group home did not make any calls, instead the patient's brother was left to make contact.  He reports that only one placement returned his call and declined the patient.  He reports that the other three did not follow up.   He reports that "about 2 weeks into the 30 days" the group home provided a fifth name for the patient to reach out to. Brother reports that he did not reach out to the 5th name because at that time he had hired an agency to assist with finding placement for the patient.  He reports that the 5th name was on her list so he did not reach out.   He reports that he did NOT sign the patient out of the group home.   Assunta Curtis, MSW, LCSW 03/23/2020 12:57 PM

## 2020-03-23 NOTE — Plan of Care (Signed)
Patient endorsing anxiety stating he is ready to leave the unit and doesn't know when or where he is going   Problem: Education: Goal: Emotional status will improve Outcome: Not Progressing Goal: Mental status will improve Outcome: Not Progressing

## 2020-03-23 NOTE — BHH Counselor (Signed)
CSW spoke with Terrilyn Saver, [phone].  She reports that the patient's previous group home does not have to find placement but assist with placement, therefore they can provide the list of possible homes.   She reports that [place] does not monitor DDA homes, the stat does. She recommended CSW call the state, if concerned that they are not adequate, [phone].    Assunta Curtis, MSW, LCSW 03/23/2020 4:05 PM

## 2020-03-23 NOTE — Progress Notes (Signed)
D- Patient alert and oriented. Affect/mood is cooperative. Pt was anxious this morning but calm this afternoon. Pt denies SI, HI, AVH, and pain. Pt received a PRN for anxiety this am. Pt has been social and and attended groups and went outside.   A- Scheduled medications administered to patient, per MD orders. Support and encouragement provided.  Routine safety checks conducted every 15 minutes.  Patient informed to notify staff with problems or concerns.  R- No adverse drug reactions noted. Patient contracts for safety at this time. Patient compliant with medications and treatment plan. Patient receptive, calm, and cooperative. Patient interacts well with others on the unit.  Patient remains safe at this time.  Collier Bullock RN

## 2020-03-24 NOTE — Plan of Care (Signed)
Patient seems very frustrated and anxious for not able to find a place for him to go.Patient wants to have someone listen to his long conversation.Staff tried to de-escalate patient.Patient got irritated states " I need discharge right now." Vistaril given and patient calm down.Patient denies SI,HI and AVH.Compliant with medications.Support and encouragement given.

## 2020-03-24 NOTE — Progress Notes (Signed)
Recreation Therapy Notes  Date: 03/24/2020  Time: 9:30 am   Location: Craft room     Behavioral response: N/A   Intervention Topic: Goals   Discussion/Intervention: Patient did not attend group.   Clinical Observations/Feedback:  Patient did not attend group.   Marilyne Haseley LRT/CTRS        Shawnette Augello 03/24/2020 12:50 PM

## 2020-03-24 NOTE — Plan of Care (Signed)
Patient loud, agitated, and anxious this evening   Problem: Education: Goal: Emotional status will improve Outcome: Not Progressing Goal: Mental status will improve Outcome: Not Progressing

## 2020-03-24 NOTE — Progress Notes (Signed)
[facility] MD Progress Note  03/24/2020 4:13 PM Alexander Beard  MRN:  [id number] Subjective: Follow-up patient with developmental disability schizoaffective disorder chronic behavior problems.  Patient today once again wants to talk at length about how he wants to have his freedom and cannot stand going back to another group home.  He is anxious and stressed clenching his jaw and his fist throughout.  Not aggressive however.  This level of posturing is normal for him when he is feeling upset.  No evidence of psychosis. Principal Problem: Schizoaffective disorder, bipolar type ([place]) Diagnosis: Principal Problem:   Schizoaffective disorder, bipolar type ([place]) Active Problems:   Active autistic disorder   Diabetes ([place])   Hypertension   Prostate hypertrophy   Intellectual disability   At risk for elopement  Total Time spent with patient: 30 minutes  Past Psychiatric History: Longstanding chronic developmental disabilities schizoaffective mood problems behavior problems  Past Medical History:  Past Medical History:  Diagnosis Date  . Anxiety   . Anxiety disorder due to known physiological condition    HOSPITALIZED 10/18  . Arthritis   . Autistic disorder, residual state   . COPD (chronic obstructive pulmonary disease) ([place])   . Depression   . Developmental disorder   . Diabetes mellitus without complication ([place])   . Dyslipidemia   . Esophageal reflux   . HOH (hard of hearing)    MILDLY  . Hypertension   . Obesity   . Overactive bladder   . Palpitations    ANXIETY  . Schizophrenia, schizoaffective ([place])   . Sleep apnea   . Tremors of nervous system    HANDS DUE TO MEDICATIONS  . Urinary incontinence     Past Surgical History:  Procedure Laterality Date  . CATARACT EXTRACTION W/PHACO Left 11/14/2017   Procedure: CATARACT EXTRACTION PHACO AND INTRAOCULAR LENS PLACEMENT (IOC);  Surgeon: Birder Robson, MD;  Location: [facility] ORS;  Service: Ophthalmology;  Laterality: Left;  [place]  00:42 AP% 14.6 CDE 6.12 Fluid pack lot # [id number]  . COLONOSCOPY  01/28/2005  . COLONOSCOPY WITH PROPOFOL N/A 05/09/2016   Procedure: COLONOSCOPY WITH PROPOFOL;  Surgeon: Manya Silvas, MD;  [facility] END[facility];  Service: Endoscopy;  Laterality: N/A;  . ESOPHAGOGASTRODUODENOSCOPY N/A 05/09/2016   Procedure: ESOPHAGOGASTRODUODENOSCOPY (EGD);  Surgeon: Manya Silvas, MD;  Location: [facility] ENDOSCOPY;  Service: Endoscopy;  Laterality: N/A;  . FRACTURE SURGERY     ORIF SHOULDER  . TONSILLECTOMY     Family History:  Family History  Problem Relation Age of Onset  . Hypertension Mother   . Stroke Father   . Heart Problems Father    Family Psychiatric  History: None identified Social History:  Social History   Substance and Sexual Activity  Alcohol Use No  . Alcohol/week: 0.0 standard drinks     Social History   Substance and Sexual Activity  Drug Use No    Social History   Socioeconomic History  . Marital status: Single    Spouse name: Not on file  . Number of children: Not on file  . Years of education: Not on file  . Highest education level: Not on file  Occupational History  . Not on file  Tobacco Use  . Smoking status: Never Smoker  . Smokeless tobacco: Never Used  Vaping Use  . Vaping Use: Never used  Substance and Sexual Activity  . Alcohol use: No    Alcohol/week: 0.0 standard drinks  . Drug use: No  . Sexual activity: Never  Other Topics Concern  . Not on file  Social History Narrative   The patient never finished high school but did get his GED. 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 He is currently in disability and [name] is his legal guardian. He has been living in group homes for many years.      No pending legal charges   Social Determinants of Health   Financial Resource Strain:   . Difficulty of Paying Living Expenses: Not on file  Food Insecurity:   . Worried  About Charity fundraiser in the Last Year: Not on file  . Ran Out of Food in the Last Year: Not on file  Transportation Needs:   . Lack of Transportation (Medical): Not on file  . Lack of Transportation (Non-Medical): Not on file  Physical Activity:   . Days of Exercise per Week: Not on file  . Minutes of Exercise per Session: Not on file  Stress:   . Feeling of Stress : Not on file  Social Connections:   . Frequency of Communication with Friends and Family: Not on file  . Frequency of Social Gatherings with Friends and Family: Not on file  . Attends Religious Services: Not on file  . Active Member of Clubs or Organizations: Not on file  . Attends Archivist Meetings: Not on file  . Marital Status: Not on file   Additional Social History:                         Sleep: Fair  Appetite:  Fair  Current Medications: Current Facility-Administered Medications  Medication Dose Route Frequency Provider Last Rate Last Admin  . acetaminophen (TYLENOL) tablet 650 mg  650 mg Oral Q6H PRN [name], MD      . alum & mag hydroxide-simeth (MAALOX/MYLANTA) 200-200-20 MG/5ML suspension 30 mL  30 mL Oral Q4H PRN [name], MD      . aspirin EC tablet 81 mg  81 mg Oral Daily Anhelica Fowers, [name], MD   81 mg at 03/24/20 0745  . clonazePAM (KLONOPIN) tablet 1 mg  1 mg Oral QID Shambhavi Salley, [name], MD   1 mg at 03/24/20 1231  . docusate sodium (COLACE) capsule 100 mg  100 mg Oral BID Nadege Carriger, [name], MD   100 mg at 03/24/20 0747  . gabapentin (NEURONTIN) capsule 100 mg  100 mg Oral TID Solash Tullo T, MD   100 mg at 03/24/20 1231  . hydrOXYzine (ATARAX/VISTARIL) tablet 10 mg  10 mg Oral TID PRN [name], MD   10 mg at 03/16/20 1437  . lamoTRIgine (LAMICTAL) tablet 100 mg  100 mg Oral QHS Jerriah Ines T, MD   100 mg at 03/23/20 2108  . linagliptin (TRADJENTA) tablet 5 mg  5 mg Oral Daily Lakiah Dhingra, [name], MD   5 mg at 03/24/20 0746  . loratadine (CLARITIN) tablet 10  mg  10 mg Oral Daily Sharronda Schweers, [name], MD   10 mg at 03/24/20 0746  . LORazepam (ATIVAN) tablet 2 mg  2 mg Oral Q6H PRN Khori Underberg, [name], MD   2 mg at 03/23/20 2108  . magnesium hydroxide (MILK OF MAGNESIA) suspension 30 mL  30 mL Oral Daily PRN [name], MD   30 mL at 03/23/20 1344  . metFORMIN (GLUCOPHAGE) tablet 850 mg  850 mg Oral BID WC Mitsuru Dault,  [name], MD   850 mg at 03/24/20 0748  . metoprolol succinate (TOPROL-XL) 24 hr tablet 50 mg  50 mg Oral Daily Brandis Matsuura T, MD   50 mg at 03/23/20 0830  . oxybutynin (DITROPAN) tablet 10 mg  10 mg Oral BID Dallon Dacosta, [name], MD   10 mg at 03/24/20 0748  . pantoprazole (PROTONIX) EC tablet 40 mg  40 mg Oral Daily Makala Fetterolf, [name], MD   40 mg at 03/24/20 0746  . prazosin (MINIPRESS) capsule 1 mg  1 mg Oral QHS Bedford Winsor, [name], MD   1 mg at 03/23/20 2108  . QUEtiapine (SEROQUEL) tablet 100 mg  100 mg Oral QID Safia Panzer T, MD   100 mg at 03/24/20 1231  . QUEtiapine (SEROQUEL) tablet 400 mg  400 mg Oral QHS [name], NP   400 mg at 03/23/20 2108  . sertraline (ZOLOFT) tablet 100 mg  100 mg Oral BID Briseida Gittings, [name], MD   100 mg at 03/24/20 0746  . simethicone (MYLICON) chewable tablet 80 mg  80 mg Oral TID Demetruis Depaul, [name], MD   80 mg at 03/24/20 1231  . simvastatin (ZOCOR) tablet 20 mg  20 mg Oral q1800 Camri Molloy, [name], MD   20 mg at 03/23/20 1718  . tamsulosin (FLOMAX) capsule 0.8 mg  0.8 mg Oral QPC supper Korinne Greenstein, [name], MD   0.8 mg at 03/23/20 1721    Lab Results: No results found for this or any previous visit (from the past 48 hour(s)).  Blood Alcohol level:  Lab Results  Component Value Date   ETH <10 01/22/2020   ETH <10 22/08/5425    Metabolic Disorder Labs: Lab Results  Component Value Date   HGBA1C 6.6 (H) 02/20/2020   MPG 142.72 02/20/2020   No results found for: PROLACTIN Lab Results  Component Value Date   CHOL 153 04/10/2015   TRIG 166 (H) 04/10/2015   HDL 50 04/10/2015   CHOLHDL 3.1 04/10/2015   VLDL  33 04/10/2015   LDLCALC 70 04/10/2015    Physical Findings: AIMS:  , ,  ,  ,    CIWA:    COWS:     Musculoskeletal: Strength & Muscle Tone: within normal limits Gait & Station: normal Patient leans: N/A  Psychiatric Specialty Exam: Physical Exam Vitals and nursing note reviewed.  Constitutional:      Appearance: He is well-developed.  HENT:     Head: Normocephalic and atraumatic.  Eyes:     Conjunctiva/sclera: Conjunctivae normal.     Pupils: Pupils are equal, round, and reactive to light.  Cardiovascular:     Heart sounds: Normal heart sounds.  Pulmonary:     Effort: Pulmonary effort is normal.  Abdominal:     Palpations: Abdomen is soft.  Musculoskeletal:        General: Normal range of motion.     Cervical back: Normal range of motion.  Skin:    General: Skin is warm and dry.  Neurological:     General: No focal deficit present.     Mental Status: He is alert.  Psychiatric:        Attention and Perception: He is inattentive.        Mood and Affect: Mood is anxious. Affect is labile.        Speech: Speech is tangential.        Behavior: Behavior is agitated. Behavior is not aggressive.        Thought Content:  Thought content is paranoid. Thought content does not include homicidal or suicidal ideation.        Cognition and Memory: Cognition is impaired.        Judgment: Judgment is inappropriate.     Review of Systems  Constitutional: Negative.   HENT: Negative.   Eyes: Negative.   Respiratory: Negative.   Cardiovascular: Negative.   Gastrointestinal: Negative.   Musculoskeletal: Negative.   Skin: Negative.   Neurological: Negative.   Psychiatric/Behavioral: The patient is nervous/anxious.     Blood pressure 130/74, pulse (!) 55, temperature 98.4 F (36.9 C), temperature source Oral, resp. rate 16, height 5\' 8"  (1.727 m), weight 104.3 kg, SpO2 100 %.Body mass index is 34.96 kg/m.  General Appearance: Casual  Eye Contact:  Fair  Speech:  Clear and  Coherent  Volume:  Increased  Mood:  Dysphoric and Irritable  Affect:  Congruent  Thought Process:  Disorganized  Orientation:  Full (Time, Place, and Person)  Thought Content:  Illogical and Rumination  Suicidal Thoughts:  No  Homicidal Thoughts:  No  Memory:  Immediate;   Fair Recent;   Fair Remote;   Fair  Judgement:  Impaired  Insight:  Shallow  Psychomotor Activity:  Restlessness  Concentration:  Concentration: Fair  Recall:  [employer] of Knowledge:  Fair  Language:  Fair  Akathisia:  No  Handed:  Right  AIMS (if indicated):     Assets:  Desire for Improvement Resilience  ADL's:  Impaired  Cognition:  Impaired,  Mild  Sleep:  Number of Hours: 7     Treatment Plan Summary: Daily contact with patient to assess and evaluate symptoms and progress in treatment, Medication management and Plan Tried to review some of the treatment plan but he is really too anxious right now and unable as usual to think about compromises or alternatives.  Worked on trying to get him to just calm down and redirect his thoughts.  We are still working on finding appropriate discharge planning  Alethia Berthold, MD 03/24/2020, 4:13 PM

## 2020-03-24 NOTE — BHH Counselor (Signed)
CSW called the following for possible bed placements after staffing with supervisor:  [facility], [phone] No bed available.   [facility], [phone] Requested that information be faxed to [phone].  [facility] CSW called and left a HIPAA compliant voicemail.   Assunta Curtis, MSW, LCSW 03/24/2020 1:13 PM

## 2020-03-24 NOTE — BHH Counselor (Signed)
CSW faxed the referral for [facility].  CSW received confirmation the fax was successful.  Assunta Curtis, MSW, LCSW 03/24/2020 2:53 PM

## 2020-03-25 NOTE — Plan of Care (Signed)
Pt denies depression, anxiety, SI, HI and AVH. Pt was educated on care plan and verbalizes understanding. Pt was encouraged to do some personal hygiene and attend groups. Collier Bullock RN Problem: Education: Goal: Knowledge of [facility] Education information/materials will improve Outcome: Progressing Goal: Emotional status will improve Outcome: Progressing Goal: Mental status will improve Outcome: Progressing Goal: Verbalization of understanding the information provided will improve Outcome: Progressing   Problem: Activity: Goal: Interest or engagement in activities will improve Outcome: Progressing Goal: Sleeping patterns will improve Outcome: Progressing   Problem: Coping: Goal: Ability to verbalize frustrations and anger appropriately will improve Outcome: Progressing Goal: Ability to demonstrate self-control will improve Outcome: Progressing   Problem: Health Behavior/Discharge Planning: Goal: Identification of resources available to assist in meeting health care needs will improve Outcome: Progressing Goal: Compliance with treatment plan for underlying cause of condition will improve Outcome: Progressing   Problem: Physical Regulation: Goal: Ability to maintain clinical measurements within normal limits will improve Outcome: Progressing   Problem: Safety: Goal: Periods of time without injury will increase Outcome: Progressing   Problem: Education: Goal: Ability to make informed decisions regarding treatment will improve Outcome: Progressing   Problem: Coping: Goal: Coping ability will improve Outcome: Progressing   Problem: Health Behavior/Discharge Planning: Goal: Identification of resources available to assist in meeting health care needs will improve Outcome: Progressing   Problem: Medication: Goal: Compliance with prescribed medication regimen will improve Outcome: Progressing   Problem: Self-Concept: Goal: Ability to disclose and discuss  suicidal ideas will improve Outcome: Progressing Goal: Will verbalize positive feelings about self Outcome: Progressing   Problem: Self-Concept: Goal: Ability to disclose and discuss suicidal ideas will improve Outcome: Progressing Goal: Will verbalize positive feelings about self Outcome: Progressing

## 2020-03-25 NOTE — Progress Notes (Signed)
Patient presents with calm and cooperative affecct. Patient denies SI/HI/AVH. Patient compliant with medication administration per MD orders. Patient given education, support, and encouragement to be active in his treatment plan. Patient being monitored Q 15 minutes for safety per unit protocol. Pt remains safe on the unit.  Patient is wanting his nieces phone number, pt given education.

## 2020-03-25 NOTE — BHH Counselor (Signed)
CSW spoke with [name], [phone], who reports no updates on the patient's referrals.   CSW contacted [facility] [phone], however was unable to speak with the group home owner and left a HIPAA compliant voicemail.  CSW called [facility] [phone], option 2, option 4 to follow up with [name] in regards to care coordination referral.  CSW left HIPAA compliant voicemail.  CSW contacted [facility], [phone] to schedule interview for group home.  CSW scheduled the patient for group home interview.  Assunta Curtis, MSW, LCSW 03/25/2020 2:51 PM

## 2020-03-25 NOTE — Progress Notes (Signed)
Recreation Therapy Notes  Date: 03/25/2020  Time: 9:30 am   Location: Craft room  Behavioral response: Appropriate  Intervention Topic: Teamwork   Discussion/Intervention:  Group content on today was focused on teamwork. The group identified what teamwork is. Individuals described who is a part of their team. Patients expressed why they thought teamwork is important. The group stated reasons why they thought it was easier to work with a Dance movement psychotherapist team. Individuals discussed some positives and negatives of working with a team. Patients gave examples of past experiences they had while working with a team. The group participated in the intervention "Story in a bag", patients were in groups and were able to test their skill in a team setting.   Clinical Observations/Feedback:  Patient came to group late due to unknown reasons. Individual was social with peers and staff while participating in the intervention.  Brandolyn Shortridge LRT/CTRS         Javonne Louissaint 03/25/2020 11:24 AM

## 2020-03-25 NOTE — Progress Notes (Signed)
[facility] MD Progress Note  03/25/2020 3:47 PM STAN CANTAVE  MRN:  [id number] Subjective: Follow-up for this patient with developmental disability.  No new complaints.  Mood stable.  No new behavior problems.  No suicidal or homicidal ideation. Principal Problem: Schizoaffective disorder, bipolar type ([place]) Diagnosis: Principal Problem:   Schizoaffective disorder, bipolar type ([place]) Active Problems:   Active autistic disorder   Diabetes ([place])   Hypertension   Prostate hypertrophy   Intellectual disability   At risk for elopement  Total Time spent with patient: 20 minutes  Past Psychiatric History: Past history of longstanding developmental disability  Past Medical History:  Past Medical History:  Diagnosis Date  . Anxiety   . Anxiety disorder due to known physiological condition    HOSPITALIZED 10/18  . Arthritis   . Autistic disorder, residual state   . COPD (chronic obstructive pulmonary disease) ([place])   . Depression   . Developmental disorder   . Diabetes mellitus without complication ([place])   . Dyslipidemia   . Esophageal reflux   . HOH (hard of hearing)    MILDLY  . Hypertension   . Obesity   . Overactive bladder   . Palpitations    ANXIETY  . Schizophrenia, schizoaffective ([place])   . Sleep apnea   . Tremors of nervous system    HANDS DUE TO MEDICATIONS  . Urinary incontinence     Past Surgical History:  Procedure Laterality Date  . CATARACT EXTRACTION W/PHACO Left 11/14/2017   Procedure: CATARACT EXTRACTION PHACO AND INTRAOCULAR LENS PLACEMENT (IOC);  Surgeon: Birder Robson, MD;  Location: [facility] ORS;  Service: Ophthalmology;  Laterality: Left;  [place] 00:42 AP% 14.6 CDE 6.12 Fluid pack lot # [id number]  . COLONOSCOPY  01/28/2005  . COLONOSCOPY WITH PROPOFOL N/A 05/09/2016   Procedure: COLONOSCOPY WITH PROPOFOL;  Surgeon: Manya Silvas, MD;  Location: [facility] ENDOSCOPY;  Service: Endoscopy;  Laterality: N/A;  . ESOPHAGOGASTRODUODENOSCOPY N/A 05/09/2016   Procedure:  ESOPHAGOGASTRODUODENOSCOPY (EGD);  Surgeon: Manya Silvas, MD;  Location: [facility] ENDOSCOPY;  Service: Endoscopy;  Laterality: N/A;  . FRACTURE SURGERY     ORIF SHOULDER  . TONSILLECTOMY     Family History:  Family History  Problem Relation Age of Onset  . Hypertension Mother   . Stroke Father   . Heart Problems Father    Family Psychiatric  History: See previous Social History:  Social History   Substance and Sexual Activity  Alcohol Use No  . Alcohol/week: 0.0 standard drinks     Social History   Substance and Sexual Activity  Drug Use No    Social History   Socioeconomic History  . Marital status: Single    Spouse name: Not on file  . Number of children: Not on file  . Years of education: Not on file  . Highest education level: Not on file  Occupational History  . Not on file  Tobacco Use  . Smoking status: Never Smoker  . Smokeless tobacco: Never Used  Vaping Use  . Vaping Use: Never used  Substance and Sexual Activity  . Alcohol use: No    Alcohol/week: 0.0 standard drinks  . Drug use: No  . Sexual activity: Never  Other Topics Concern  . Not on file  Social History Narrative   The patient never finished high school but did get his GED. 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 He is currently in disability and his  brother [name] is his legal guardian. He has been living in group homes for many years.      No pending legal charges   Social Determinants of Health   Financial Resource Strain:   . Difficulty of Paying Living Expenses: Not on file  Food Insecurity:   . Worried About Charity fundraiser in the Last Year: Not on file  . Ran Out of Food in the Last Year: Not on file  Transportation Needs:   . Lack of Transportation (Medical): Not on file  . Lack of Transportation (Non-Medical): Not on file  Physical Activity:   . Days of Exercise per Week: Not on file  . Minutes of Exercise per  Session: Not on file  Stress:   . Feeling of Stress : Not on file  Social Connections:   . Frequency of Communication with Friends and Family: Not on file  . Frequency of Social Gatherings with Friends and Family: Not on file  . Attends Religious Services: Not on file  . Active Member of Clubs or Organizations: Not on file  . Attends Archivist Meetings: Not on file  . Marital Status: Not on file   Additional Social History:                         Sleep: Fair  Appetite:  Fair  Current Medications: Current Facility-Administered Medications  Medication Dose Route Frequency Provider Last Rate Last Admin  . acetaminophen (TYLENOL) tablet 650 mg  650 mg Oral Q6H PRN [name], MD      . alum & mag hydroxide-simeth (MAALOX/MYLANTA) 200-200-20 MG/5ML suspension 30 mL  30 mL Oral Q4H PRN [name], MD      . aspirin EC tablet 81 mg  81 mg Oral Daily Dorena Dorfman, [name], MD   81 mg at 03/25/20 0904  . clonazePAM (KLONOPIN) tablet 1 mg  1 mg Oral QID Lashonne Shull, [name], MD   1 mg at 03/25/20 1236  . docusate sodium (COLACE) capsule 100 mg  100 mg Oral BID Zaylei Mullane, [name], MD   100 mg at 03/25/20 0904  . gabapentin (NEURONTIN) capsule 100 mg  100 mg Oral TID Wray Goehring T, MD   100 mg at 03/25/20 1235  . hydrOXYzine (ATARAX/VISTARIL) tablet 10 mg  10 mg Oral TID PRN [name], MD   10 mg at 03/24/20 1725  . lamoTRIgine (LAMICTAL) tablet 100 mg  100 mg Oral QHS Larren Copes T, MD   100 mg at 03/23/20 2108  . linagliptin (TRADJENTA) tablet 5 mg  5 mg Oral Daily Valerye Kobus, [name], MD   5 mg at 03/25/20 0905  . loratadine (CLARITIN) tablet 10 mg  10 mg Oral Daily Shawanda Sievert, [name], MD   10 mg at 03/25/20 0905  . LORazepam (ATIVAN) tablet 2 mg  2 mg Oral Q6H PRN Jemma Rasp, [name], MD   2 mg at 03/23/20 2108  . magnesium hydroxide (MILK OF MAGNESIA) suspension 30 mL  30 mL Oral Daily PRN [name], MD   30 mL at 03/23/20 1344  . metFORMIN (GLUCOPHAGE) tablet 850  mg  850 mg Oral BID WC Beula Joyner, [name], MD   850 mg at 03/25/20 0906  . metoprolol succinate (TOPROL-XL) 24 hr tablet 50 mg  50 mg Oral Daily Lynesha Bango, [name], MD   50 mg at 03/25/20 0905  . oxybutynin (DITROPAN) tablet 10 mg  10 mg Oral BID Rebekha Diveley T,  MD   10 mg at 03/25/20 0906  . pantoprazole (PROTONIX) EC tablet 40 mg  40 mg Oral Daily Koreen Lizaola, [name], MD   40 mg at 03/25/20 0904  . prazosin (MINIPRESS) capsule 1 mg  1 mg Oral QHS Tienna Bienkowski, [name], MD   1 mg at 03/23/20 2108  . QUEtiapine (SEROQUEL) tablet 100 mg  100 mg Oral QID Kell Ferris T, MD   100 mg at 03/25/20 1235  . QUEtiapine (SEROQUEL) tablet 400 mg  400 mg Oral QHS [name], NP   400 mg at 03/23/20 2108  . sertraline (ZOLOFT) tablet 100 mg  100 mg Oral BID Rin Gorton, [name], MD   100 mg at 03/25/20 0904  . simethicone (MYLICON) chewable tablet 80 mg  80 mg Oral TID Henley Boettner, [name], MD   80 mg at 03/25/20 1236  . simvastatin (ZOCOR) tablet 20 mg  20 mg Oral q1800 Tarin Johndrow, [name], MD   20 mg at 03/24/20 1719  . tamsulosin (FLOMAX) capsule 0.8 mg  0.8 mg Oral QPC supper Kaileen Bronkema, [name], MD   0.8 mg at 03/24/20 1719    Lab Results: No results found for this or any previous visit (from the past 48 hour(s)).  Blood Alcohol level:  Lab Results  Component Value Date   ETH <10 01/22/2020   ETH <10 78/29/5621    Metabolic Disorder Labs: Lab Results  Component Value Date   HGBA1C 6.6 (H) 02/20/2020   MPG 142.72 02/20/2020   No results found for: PROLACTIN Lab Results  Component Value Date   CHOL 153 04/10/2015   TRIG 166 (H) 04/10/2015   HDL 50 04/10/2015   CHOLHDL 3.1 04/10/2015   VLDL 33 04/10/2015   LDLCALC 70 04/10/2015    Physical Findings: AIMS:  , ,  ,  ,    CIWA:    COWS:     Musculoskeletal: Strength & Muscle Tone: within normal limits Gait & Station: normal Patient leans: N/A  Psychiatric Specialty Exam: Physical Exam Vitals and nursing note reviewed.  Constitutional:       Appearance: He is well-developed.  HENT:     Head: Normocephalic and atraumatic.  Eyes:     Conjunctiva/sclera: Conjunctivae normal.     Pupils: Pupils are equal, round, and reactive to light.  Cardiovascular:     Heart sounds: Normal heart sounds.  Pulmonary:     Effort: Pulmonary effort is normal.  Abdominal:     Palpations: Abdomen is soft.  Musculoskeletal:        General: Normal range of motion.     Cervical back: Normal range of motion.  Skin:    General: Skin is warm and dry.  Neurological:     General: No focal deficit present.     Mental Status: He is alert.  Psychiatric:        Attention and Perception: He is inattentive.        Mood and Affect: Mood is anxious.        Speech: Speech normal.        Behavior: Behavior is agitated. Behavior is not aggressive.        Thought Content: Thought content is not paranoid. Thought content does not include homicidal or suicidal ideation.        Cognition and Memory: Cognition is impaired.        Judgment: Judgment is impulsive.     Review of Systems  Constitutional: Negative.   HENT: Negative.   Eyes:  Negative.   Respiratory: Negative.   Cardiovascular: Negative.   Gastrointestinal: Negative.   Musculoskeletal: Negative.   Skin: Negative.   Neurological: Negative.   Psychiatric/Behavioral: The patient is nervous/anxious.     Blood pressure 135/73, pulse (!) 53, temperature 99 F (37.2 C), temperature source Oral, resp. rate 18, height 5\' 8"  (1.727 m), weight 104.3 kg, SpO2 99 %.Body mass index is 34.96 kg/m.  General Appearance: Casual  Eye Contact:  Good  Speech:  Clear and Coherent  Volume:  Normal  Mood:  Euthymic  Affect:  Congruent  Thought Process:  Goal Directed  Orientation:  Full (Time, Place, and Person)  Thought Content:  Logical  Suicidal Thoughts:  No  Homicidal Thoughts:  No  Memory:  Immediate;   Fair Recent;   Poor Remote;   Poor  Judgement:  Fair  Insight:  Fair  Psychomotor Activity:   Normal  Concentration:  Concentration: Fair  Recall:  [employer] of Knowledge:  Fair  Language:  Fair  Akathisia:  No  Handed:  Right  AIMS (if indicated):     Assets:  Desire for Improvement  ADL's:  Intact  Cognition:  WNL  Sleep:  Number of Hours: 4.5     Treatment Plan Summary: Plan No change to medication.  Waiting for placement  Alethia Berthold, MD 03/25/2020, 3:47 PM

## 2020-03-25 NOTE — Progress Notes (Signed)
Patient presents with anxiety and was yelling upon arrival to the unit. Patient was redirected to his room and this Probation officer stated I would go speak with him after report. Patient went to his room and fell asleep. Medications were held this evening because patient was asleep and didn't want to wake him due to his agitation. Patient given education, support, and encouragement to be active in his treatment plan. Patient being monitored Q 15 minutes for safety per unit protocol. Pt remains safe on the unit.

## 2020-03-25 NOTE — Progress Notes (Signed)
D- Patient alert and oriented. Affect/mood is calm and cooperative. Pt denies SI, HI, AVH, and pain.Pt took a shower this evening. Pt has been social with students, staff and patients.   A- Scheduled medications administered to patient, per MD orders. Support and encouragement provided.  Routine safety checks conducted every 15 minutes.  Patient informed to notify staff with problems or concerns.  R- No adverse drug reactions noted. Patient contracts for safety at this time. Patient compliant with medications and treatment plan. Patient receptive, calm, and cooperative. Patient interacts well with others on the unit.  Patient remains safe at this time.  Collier Bullock RN

## 2020-03-25 NOTE — Plan of Care (Signed)
Patient presents with less anxiety this evening   Problem: Education: Goal: Emotional status will improve Outcome: Progressing Goal: Mental status will improve Outcome: Progressing

## 2020-03-26 MED ORDER — INFLUENZA VAC SPLIT QUAD 0.5 ML IM SUSY
0.5000 mL | PREFILLED_SYRINGE | INTRAMUSCULAR | Status: DC
  Filled 2020-03-26: qty 0.5

## 2020-03-26 NOTE — Progress Notes (Signed)
Recreation Therapy Notes  Date: 03/26/2020  Time: 9:30 am   Location: Craft room  Behavioral response: Appropriate  Intervention Topic: Communication   Discussion/Intervention:  Group content today was focused on communication. The group defined communication and ways to communicate with others. Individuals stated reason why communication is important and some reasons to communicate with others. Patients expressed if they thought they were good at communicating with others and ways they could improve their communication skills. The group identified important parts of communication and some experiences they have had in the past with communication. The group participated in the intervention "What is that?", where they had a chance to test out their communication skills and identify ways to improve their communication techniques.   Clinical Observations/Feedback: Patient came to group and defined communication as having a good personality,being responsive and treating people well. He explained that there was no good communication in his groups home.Individual was social with peers and staff while participating in the intervention.  Viney Acocella LRT/CTRS         Samanda Buske 03/26/2020 12:26 PM

## 2020-03-26 NOTE — Progress Notes (Signed)
Pt is alert and oriented to person, place, time and situation. Pt has a flat affect, calm, cooperative, pleasant, denies feelings of anxiety at this times. Pt reports he slept well, appetite is good, pt ate breakfast, then spends time coloring and drawing in the dayroom sitting among his peers. Pt is medication complaint. Will continue to monitor pt per Q15 minute face checks and monitor for safety and progress.

## 2020-03-26 NOTE — Tx Team (Signed)
Interdisciplinary Treatment and Diagnostic Plan Update  03/26/2020 Time of Session: 9:00 AM TROOPER OLANDER MRN: [id number]  Principal Diagnosis: Schizoaffective disorder, bipolar type ([place])  Secondary Diagnoses: Principal Problem:   Schizoaffective disorder, bipolar type ([place]) Active Problems:   Active autistic disorder   Diabetes ([place])   Hypertension   Prostate hypertrophy   Intellectual disability   At risk for elopement   Current Medications:  Current Facility-Administered Medications  Medication Dose Route Frequency Provider Last Rate Last Admin  . acetaminophen (TYLENOL) tablet 650 mg  650 mg Oral Q6H PRN [name], MD      . alum & mag hydroxide-simeth (MAALOX/MYLANTA) 200-200-20 MG/5ML suspension 30 mL  30 mL Oral Q4H PRN [name], MD      . aspirin EC tablet 81 mg  81 mg Oral Daily [name], [name], MD   81 mg at 03/26/20 0806  . clonazePAM (KLONOPIN) tablet 1 mg  1 mg Oral QID [name], [name], MD   1 mg at 03/26/20 1132  . docusate sodium (COLACE) capsule 100 mg  100 mg Oral BID [name], [name], MD   100 mg at 03/26/20 0806  . gabapentin (NEURONTIN) capsule 100 mg  100 mg Oral TID [name], [name], MD   100 mg at 03/26/20 1131  . hydrOXYzine (ATARAX/VISTARIL) tablet 10 mg  10 mg Oral TID PRN [name], MD   10 mg at 03/24/20 1725  . [START ON 03/27/2020] influenza vac split quadrivalent PF (FLUARIX) injection 0.5 mL  0.5 mL Intramuscular Tomorrow-1000 [name], [name], MD      . lamoTRIgine (LAMICTAL) tablet 100 mg  100 mg Oral QHS [name], [name], MD   100 mg at 03/25/20 2106  . linagliptin (TRADJENTA) tablet 5 mg  5 mg Oral Daily [name], [name], MD   5 mg at 03/26/20 0805  . loratadine (CLARITIN) tablet 10 mg  10 mg Oral Daily [name], [name], MD   10 mg at 03/26/20 0807  . LORazepam (ATIVAN) tablet 2 mg  2 mg Oral Q6H PRN [name], [name], MD   2 mg at 03/23/20 2108  . magnesium hydroxide (MILK OF MAGNESIA) suspension 30 mL  30 mL Oral Daily PRN [name], MD   30 mL at 03/23/20 1344  . metFORMIN (GLUCOPHAGE) tablet 850 mg  850 mg Oral BID WC [name], [name], MD   850 mg at 03/26/20 0806  . metoprolol succinate (TOPROL-XL) 24 hr tablet 50 mg  50 mg Oral Daily [name], [name], MD   50 mg at 03/26/20 0806  . oxybutynin (DITROPAN) tablet 10 mg  10 mg Oral BID [name], [name], MD   10 mg at 03/26/20 0806  . pantoprazole (PROTONIX) EC tablet 40 mg  40 mg Oral Daily [name], [name], MD   40 mg at 03/26/20 0806  . prazosin (MINIPRESS) capsule 1 mg  1 mg Oral QHS [name], [name], MD   1 mg at 03/25/20 2106  . QUEtiapine (SEROQUEL) tablet 100 mg  100 mg Oral QID [name], [name], MD   100 mg at 03/26/20 1132  . QUEtiapine (SEROQUEL) tablet 400 mg  400 mg Oral QHS [name], NP   400 mg at 03/25/20 2108  . sertraline (ZOLOFT) tablet 100 mg  100 mg Oral BID [name], [name], MD   100 mg at 03/26/20 0806  . simethicone (MYLICON) chewable tablet 80 mg  80 mg Oral TID [name], [name], MD   80  mg at 03/26/20 1132  . simvastatin (ZOCOR) tablet 20 mg  20 mg Oral q1800 [name], [name], MD   20 mg at 03/25/20 1827  . tamsulosin (FLOMAX) capsule 0.8 mg  0.8 mg Oral QPC supper [name], [name], MD   0.8 mg at 03/25/20 1828   PTA Medications: Medications Prior to Admission  Medication Sig Dispense Refill Last Dose  . acetaminophen (TYLENOL) 650 MG CR tablet Take 650 mg by mouth every 12 (twelve) hours as needed for pain.     Marland Kitchen aspirin EC 81 MG tablet Take 81 mg by mouth daily.     . Calcium Carbonate-Vitamin D3 (CALCIUM 600-D) 600-400 MG-UNIT TABS Take 1 tablet by mouth 2 (two) times daily.     . Cholecalciferol (VITAMIN D-1000 MAX ST) 1000 UNITS tablet Take 1,000 Units by mouth daily.      Marland Kitchen docusate sodium (COLACE) 100 MG capsule Take 100 mg by mouth daily.      . furosemide (LASIX) 20 MG tablet Take 20 mg by mouth daily.      Marland Kitchen gabapentin (NEURONTIN) 100 MG capsule Take 1 capsule (100 mg total) by mouth 3 (three) times daily. 90 capsule 10   .  Insulin Detemir (LEVEMIR FLEXTOUCH) 100 UNIT/ML Pen Inject 15 Units into the skin daily at 10 pm.     . lamoTRIgine (LAMICTAL) 100 MG tablet Take 1 tablet (100 mg total) by mouth at bedtime. 30 tablet 10   . loratadine (CLARITIN) 10 MG tablet Take 1 tablet (10 mg total) by mouth daily. 30 tablet 5   . LORazepam (ATIVAN) 1 MG tablet Take 1 tablet (1 mg total) by mouth 3 (three) times daily. 90 tablet 5   . lurasidone (LATUDA) 40 MG TABS tablet Take 1 tablet (40 mg total) by mouth daily. with food 30 tablet 10   . metoprolol succinate (TOPROL-XL) 50 MG 24 hr tablet Take 50 mg by mouth daily.      . NON FORMULARY cpap device     . oxybutynin (DITROPAN) 5 MG tablet Take 5 mg by mouth daily.     . pantoprazole (PROTONIX) 40 MG tablet Take 40 mg by mouth daily.     . polyethylene glycol (MIRALAX / GLYCOLAX) packet Take 17 g by mouth daily.     . QUEtiapine (SEROQUEL) 100 MG tablet TAKE 1 TABLET  BY MOUTH THREE TIMES A DAY (Patient taking differently: Take 100 mg by mouth 3 (three) times daily. TAKE 1 TABLET  BY MOUTH THREE TIMES A DAY) 90 tablet 11   . QUEtiapine (SEROQUEL) 400 MG tablet Take 1 tablet (400 mg total) by mouth at bedtime. 30 tablet 10   . sertraline (ZOLOFT) 100 MG tablet Take 2 tablets (200 mg total) by mouth daily. (Patient taking differently: Take 200 mg by mouth daily. Take along with two 50 mg tablets (100 mg) for total 300 mg daily) 60 tablet 5   . sertraline (ZOLOFT) 50 MG tablet Take 2 tablets (100 mg total) by mouth daily. (Patient taking differently: Take 100 mg by mouth daily. Take along with two 100 mg tablets (200 mg) for total 300 mg daily) 60 tablet 5   . simethicone (MYLICON) 80 MG chewable tablet Chew 80-160 mg by mouth 2 (two) times daily as needed for flatulence.     . simvastatin (ZOCOR) 20 MG tablet Take 20 mg by mouth at bedtime.      . sitaGLIPtin (JANUVIA) 100 MG tablet Take 100 mg by mouth daily.     Marland Kitchen  solifenacin (VESICARE) 5 MG tablet Take 5 mg by mouth daily.      . Starch (HEMORRHOIDAL RE) Place 1 application rectally 4 (four) times daily as needed (for itching).     . tamsulosin (FLOMAX) 0.4 MG CAPS capsule Take 0.4 mg by mouth daily.       Patient Stressors: Health problems Marital or family conflict Medication change or noncompliance Traumatic event  Patient Strengths: Motivation for treatment/growth Religious Affiliation  Treatment Modalities: Medication Management, Group therapy, Case management,  1 to 1 session with clinician, Psychoeducation, Recreational therapy.   Physician Treatment Plan for Primary Diagnosis: Schizoaffective disorder, bipolar type ([place]) Long Term Goal(s): Improvement in symptoms so as ready for discharge Improvement in symptoms so as ready for discharge   Short Term Goals: Ability to verbalize feelings will improve Ability to demonstrate self-control will improve Ability to maintain clinical measurements within normal limits will improve Compliance with prescribed medications will improve  Medication Management: Evaluate patient's response, side effects, and tolerance of medication regimen.  Therapeutic Interventions: 1 to 1 sessions, Unit Group sessions and Medication administration.  Evaluation of Outcomes: Progressing  Physician Treatment Plan for Secondary Diagnosis: Principal Problem:   Schizoaffective disorder, bipolar type ([place]) Active Problems:   Active autistic disorder   Diabetes ([place])   Hypertension   Prostate hypertrophy   Intellectual disability   At risk for elopement  Long Term Goal(s): Improvement in symptoms so as ready for discharge Improvement in symptoms so as ready for discharge   Short Term Goals: Ability to verbalize feelings will improve Ability to demonstrate self-control will improve Ability to maintain clinical measurements within normal limits will improve Compliance with prescribed medications will improve     Medication Management: Evaluate patient's response, side  effects, and tolerance of medication regimen.  Therapeutic Interventions: 1 to 1 sessions, Unit Group sessions and Medication administration.  Evaluation of Outcomes: Progressing   RN Treatment Plan for Primary Diagnosis: Schizoaffective disorder, bipolar type ([place]) Long Term Goal(s): Knowledge of disease and therapeutic regimen to maintain health will improve  Short Term Goals: Ability to demonstrate self-control, Ability to participate in decision making will improve, Ability to verbalize feelings will improve, Ability to disclose and discuss suicidal ideas, Ability to identify and develop effective coping behaviors will improve and Compliance with prescribed medications will improve  Medication Management: RN will administer medications as ordered by provider, will assess and evaluate patient's response and provide education to patient for prescribed medication. RN will report any adverse and/or side effects to prescribing provider.  Therapeutic Interventions: 1 on 1 counseling sessions, Psychoeducation, Medication administration, Evaluate responses to treatment, Monitor vital signs and CBGs as ordered, Perform/monitor CIWA, COWS, AIMS and Fall Risk screenings as ordered, Perform wound care treatments as ordered.  Evaluation of Outcomes: Progressing   LCSW Treatment Plan for Primary Diagnosis: Schizoaffective disorder, bipolar type ([place]) Long Term Goal(s): Safe transition to appropriate next level of care at discharge, Engage patient in therapeutic group addressing interpersonal concerns.  Short Term Goals: Engage patient in aftercare planning with referrals and resources, Increase social support, Increase ability to appropriately verbalize feelings, Increase emotional regulation and Increase skills for wellness and recovery  Therapeutic Interventions: Assess for all discharge needs, 1 to 1 time with Social worker, Explore available resources and support systems, Assess for adequacy in  community support network, Educate family and significant other(s) on suicide prevention, Complete Psychosocial Assessment, Interpersonal group therapy.  Evaluation of Outcomes: Progressing   Progress in Treatment: Attending groups: Yes. Participating in  groups: Yes. Taking medication as prescribed: Yes. Toleration medication: Yes. Family/Significant other contact made: Yes, individual(s) contacted:  [place], Brother  Patient understands diagnosis: Yes. Discussing patient identified problems/goals with staff: Yes. Medical problems stabilized or resolved: Yes. Denies suicidal/homicidal ideation: Yes. Issues/concerns per patient self-inventory: No. Other: N/a  New problem(s) identified: No, Describe:  None  New Short Term/Long Term Goal(s): Elimination of symptoms of psychosis, medication management for mood stabilization; elimination of SI thoughts; development of comprehensive mental wellness/sobriety plan.Update 03/01/20:No changes at this time. 03/07/20: Update, no changes at this time Update 03/12/2020:  No changes at this time. Update 03/17/2020:  No changes at this time. Update 03/21/20: No changes at this time. Update 03/26/20:  No changes at this time   Patient Goals:  Patient stated he would like to go back to [place] and reconnect with his peer support. Patient also stated that he would like to increase his social support and help his brother.Update 03/07/20, no change Update 03/12/2020:  No changes at this time.  Update 03/17/2020:  No changes at this time. Update 03/21/20: No changes at this time. Update 03/26/20:  No changes at this time  Discharge Plan or Barriers: Patient has an interview with a group home on 03/09/20 at 2:00 PM. Update 03/12/2020:  CSW continues to assist the patient in developing a housing plan.   Update 03/17/2020:  CSW has assisted patient in completing an interview with a group home, however, there has been no word on if the patient has been approved.  Patient continues  to struggle with his anxiety and nerves.  Nurses report that he has increased in bed wetting.  Psychiatrist and nurses believe that this may be related to his increased anxiety about finding a home. Update 03/21/20: Patient is still nervous and anxious about finding a new placement. Patient stated he could not stay here too much longer and wanted his life back. Update 03/26/20: CSW continues to look for placement for pt.  Group Homes, [place] are being considered.   Reason for Continuation of Hospitalization: Anxiety Depression Medication stabilization  Estimated Length of Stay: TBD  Attendees: Patient: 03/26/2020 12:47 PM  Physician: [name], MD  03/26/2020 12:47 PM  Nursing:  03/26/2020 12:47 PM  RN Care Manager: 03/26/2020 12:47 PM  Social Worker: Assunta Curtis, [place] 03/26/2020 12:47 PM  Recreational Therapist:  03/26/2020 12:47 PM  Other:  03/26/2020 12:47 PM  Other:  03/26/2020 12:47 PM  Other: 03/26/2020 12:47 PM    Scribe for Treatment Team: [name], LCSW 03/26/2020 12:47 PM

## 2020-03-26 NOTE — [facility] Group Notes (Signed)
[facility] Group Notes:  (Nursing/MHT/Case Management/Adjunct)  Date:  03/26/2020  Time:  8:49 PM  Type of Therapy:  Group Therapy  Participation Level:  Active  Participation Quality:  Appropriate  Affect:  Appropriate  Cognitive:  Alert  Insight:  Good  Engagement in Group:  Engaged and his goal is to be discharge,the more he stay here is getting difficult  Modes of Intervention:  Support  Summary of Progress/Problems:  [place] Beard 03/26/2020, 8:49 PM

## 2020-03-26 NOTE — Progress Notes (Signed)
Patient presents withcalm and cooperative affecct. Patient denies SI/HI/AVH. Patient compliant with medication administration per MD orders.Patient given education, support, and encouragement to be active in his treatment plan. Patient being monitored Q 15 minutes for safety per unit protocol. Pt remains safe on the unit.

## 2020-03-26 NOTE — Progress Notes (Signed)
[facility] MD Progress Note  03/26/2020 3:49 PM Alexander Beard  MRN:  [id number] Subjective: Patient seen chart reviewed follow-up this gentleman with schizoaffective disorder and developmental disability.  Today he is not necessarily worse but not any better.  He continues to perseverate on the same issues he always has.  This is the problem with Alexander Beard that there are major aspects of what he has that medication is just not going to address.  He does not have insight or the cognitive ability to think through compromise about his wishes and he is constantly going to perseverate on them no matter how much medicine he takes.  On the other hand he is certainly not aggressive or violent or threatening Principal Problem: Schizoaffective disorder, bipolar type ([place]) Diagnosis: Principal Problem:   Schizoaffective disorder, bipolar type ([place]) Active Problems:   Active autistic disorder   Diabetes ([place])   Hypertension   Prostate hypertrophy   Intellectual disability   At risk for elopement  Total Time spent with patient: 30 minutes  Past Psychiatric History: Longstanding history of chronic behavior problems related to developmental disability  Past Medical History:  Past Medical History:  Diagnosis Date  . Anxiety   . Anxiety disorder due to known physiological condition    HOSPITALIZED 10/18  . Arthritis   . Autistic disorder, residual state   . COPD (chronic obstructive pulmonary disease) ([place])   . Depression   . Developmental disorder   . Diabetes mellitus without complication ([place])   . Dyslipidemia   . Esophageal reflux   . HOH (hard of hearing)    MILDLY  . Hypertension   . Obesity   . Overactive bladder   . Palpitations    ANXIETY  . Schizophrenia, schizoaffective ([place])   . Sleep apnea   . Tremors of nervous system    HANDS DUE TO MEDICATIONS  . Urinary incontinence     Past Surgical History:  Procedure Laterality Date  . CATARACT EXTRACTION W/PHACO Left 11/14/2017    Procedure: CATARACT EXTRACTION PHACO AND INTRAOCULAR LENS PLACEMENT (IOC);  Surgeon: Birder Robson, MD;  Location: [facility] ORS;  Service: Ophthalmology;  Laterality: Left;  [place] 00:42 AP% 14.6 CDE 6.12 Fluid pack lot # [id number]  . COLONOSCOPY  01/28/2005  . COLONOSCOPY WITH PROPOFOL N/A 05/09/2016   Procedure: COLONOSCOPY WITH PROPOFOL;  Surgeon: Manya Silvas, MD;  Location: [facility] ENDOSCOPY;  Service: Endoscopy;  Laterality: N/A;  . ESOPHAGOGASTRODUODENOSCOPY N/A 05/09/2016   Procedure: ESOPHAGOGASTRODUODENOSCOPY (EGD);  Surgeon: Manya Silvas, MD;  Location: [facility] ENDOSCOPY;  Service: Endoscopy;  Laterality: N/A;  . FRACTURE SURGERY     ORIF SHOULDER  . TONSILLECTOMY     Family History:  Family History  Problem Relation Age of Onset  . Hypertension Mother   . Stroke Father   . Heart Problems Father    Family Psychiatric  History: See previous Social History:  Social History   Substance and Sexual Activity  Alcohol Use No  . Alcohol/week: 0.0 standard drinks     Social History   Substance and Sexual Activity  Drug Use No    Social History   Socioeconomic History  . Marital status: Single    Spouse name: Not on file  . Number of children: Not on file  . Years of education: Not on file  . Highest education level: Not on file  Occupational History  . Not on file  Tobacco Use  . Smoking status: Never Smoker  . Smokeless tobacco: Never Used  Vaping Use  . Vaping Use: Never used  Substance and Sexual Activity  . Alcohol use: No    Alcohol/week: 0.0 standard drinks  . Drug use: No  . Sexual activity: Never  Other Topics Concern  . Not on file  Social History Narrative   The patient never finished high school but did get his GED. 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 He is currently in disability and [name] is his legal guardian. He has been living in group homes for many years.      No  pending legal charges   Social Determinants of Health   Financial Resource Strain:   . Difficulty of Paying Living Expenses: Not on file  Food Insecurity:   . Worried About Charity fundraiser in the Last Year: Not on file  . Ran Out of Food in the Last Year: Not on file  Transportation Needs:   . Lack of Transportation (Medical): Not on file  . Lack of Transportation (Non-Medical): Not on file  Physical Activity:   . Days of Exercise per Week: Not on file  . Minutes of Exercise per Session: Not on file  Stress:   . Feeling of Stress : Not on file  Social Connections:   . Frequency of Communication with Friends and Family: Not on file  . Frequency of Social Gatherings with Friends and Family: Not on file  . Attends Religious Services: Not on file  . Active Member of Clubs or Organizations: Not on file  . Attends Archivist Meetings: Not on file  . Marital Status: Not on file   Additional Social History:                         Sleep: Fair  Appetite:  Fair  Current Medications: Current Facility-Administered Medications  Medication Dose Route Frequency Provider Last Rate Last Admin  . acetaminophen (TYLENOL) tablet 650 mg  650 mg Oral Q6H PRN [name], MD      . alum & mag hydroxide-simeth (MAALOX/MYLANTA) 200-200-20 MG/5ML suspension 30 mL  30 mL Oral Q4H PRN [name], MD      . aspirin EC tablet 81 mg  81 mg Oral Daily Iriana Artley, [name], MD   81 mg at 03/26/20 0806  . clonazePAM (KLONOPIN) tablet 1 mg  1 mg Oral QID Frances Ambrosino, [name], MD   1 mg at 03/26/20 1132  . docusate sodium (COLACE) capsule 100 mg  100 mg Oral BID Hilary Milks, [name], MD   100 mg at 03/26/20 0806  . gabapentin (NEURONTIN) capsule 100 mg  100 mg Oral TID Kenwood Rosiak T, MD   100 mg at 03/26/20 1131  . hydrOXYzine (ATARAX/VISTARIL) tablet 10 mg  10 mg Oral TID PRN [name], MD   10 mg at 03/26/20 1404  . [START ON 03/27/2020] influenza vac split quadrivalent PF  (FLUARIX) injection 0.5 mL  0.5 mL Intramuscular Tomorrow-1000 Edilson Vital T, MD      . lamoTRIgine (LAMICTAL) tablet 100 mg  100 mg Oral QHS Arista Kettlewell T, MD   100 mg at 03/25/20 2106  . linagliptin (TRADJENTA) tablet 5 mg  5 mg Oral Daily Priyansh Pry, [name], MD   5 mg at 03/26/20 0805  . loratadine (CLARITIN) tablet 10 mg  10 mg Oral Daily Jowel Waltner, [name], MD   10 mg at 03/26/20 0807  .  LORazepam (ATIVAN) tablet 2 mg  2 mg Oral Q6H PRN Saabir Blyth, [name], MD   2 mg at 03/23/20 2108  . magnesium hydroxide (MILK OF MAGNESIA) suspension 30 mL  30 mL Oral Daily PRN [name], MD   30 mL at 03/23/20 1344  . metFORMIN (GLUCOPHAGE) tablet 850 mg  850 mg Oral BID WC Noell Lorensen, [name], MD   850 mg at 03/26/20 0806  . metoprolol succinate (TOPROL-XL) 24 hr tablet 50 mg  50 mg Oral Daily Emmajane Altamura, [name], MD   50 mg at 03/26/20 0806  . oxybutynin (DITROPAN) tablet 10 mg  10 mg Oral BID Shyhiem Beeney, [name], MD   10 mg at 03/26/20 0806  . pantoprazole (PROTONIX) EC tablet 40 mg  40 mg Oral Daily Maniya Donovan, [name], MD   40 mg at 03/26/20 0806  . prazosin (MINIPRESS) capsule 1 mg  1 mg Oral QHS Mehek Grega T, MD   1 mg at 03/25/20 2106  . QUEtiapine (SEROQUEL) tablet 100 mg  100 mg Oral QID Jazion Atteberry, [name], MD   100 mg at 03/26/20 1132  . QUEtiapine (SEROQUEL) tablet 400 mg  400 mg Oral QHS [name], NP   400 mg at 03/25/20 2108  . sertraline (ZOLOFT) tablet 100 mg  100 mg Oral BID Enya Bureau, [name], MD   100 mg at 03/26/20 0806  . simethicone (MYLICON) chewable tablet 80 mg  80 mg Oral TID Ayisha Pol, [name], MD   80 mg at 03/26/20 1132  . simvastatin (ZOCOR) tablet 20 mg  20 mg Oral q1800 Raelynn Corron, [name], MD   20 mg at 03/25/20 1827  . tamsulosin (FLOMAX) capsule 0.8 mg  0.8 mg Oral QPC supper Angelynn Lemus, [name], MD   0.8 mg at 03/25/20 1828    Lab Results: No results found for this or any previous visit (from the past 48 hour(s)).  Blood Alcohol level:  Lab Results  Component Value Date   ETH <10  01/22/2020   ETH <10 01/60/1093    Metabolic Disorder Labs: Lab Results  Component Value Date   HGBA1C 6.6 (H) 02/20/2020   MPG 142.72 02/20/2020   No results found for: PROLACTIN Lab Results  Component Value Date   CHOL 153 04/10/2015   TRIG 166 (H) 04/10/2015   HDL 50 04/10/2015   CHOLHDL 3.1 04/10/2015   VLDL 33 04/10/2015   LDLCALC 70 04/10/2015    Physical Findings: AIMS:  , ,  ,  ,    CIWA:    COWS:     Musculoskeletal: Strength & Muscle Tone: within normal limits Gait & Station: normal Patient leans: N/A  Psychiatric Specialty Exam: Physical Exam Vitals and nursing note reviewed.  Constitutional:      Appearance: He is well-developed.  HENT:     Head: Normocephalic and atraumatic.  Eyes:     Conjunctiva/sclera: Conjunctivae normal.     Pupils: Pupils are equal, round, and reactive to light.  Cardiovascular:     Heart sounds: Normal heart sounds.  Pulmonary:     Effort: Pulmonary effort is normal.  Abdominal:     Palpations: Abdomen is soft.  Musculoskeletal:        General: Normal range of motion.     Cervical back: Normal range of motion.  Skin:    General: Skin is warm and dry.  Neurological:     General: No focal deficit present.     Mental Status: He is alert.  Psychiatric:  Attention and Perception: He is inattentive.        Mood and Affect: Mood is anxious.        Speech: Speech is rapid and pressured.        Behavior: Behavior is agitated. Behavior is not aggressive.        Thought Content: Thought content does not include homicidal or suicidal ideation.        Cognition and Memory: Cognition is impaired.        Judgment: Judgment is impulsive.     Review of Systems  Constitutional: Negative.   HENT: Negative.   Eyes: Negative.   Respiratory: Negative.   Cardiovascular: Negative.   Gastrointestinal: Negative.   Musculoskeletal: Negative.   Skin: Negative.   Neurological: Negative.   Psychiatric/Behavioral: Positive for  behavioral problems and dysphoric mood. The patient is nervous/anxious.     Blood pressure 118/69, pulse 62, temperature 98.2 F (36.8 C), temperature source Oral, resp. rate 17, height 5\' 8"  (1.727 m), weight 104.3 kg, SpO2 98 %.Body mass index is 34.96 kg/m.  General Appearance: Casual  Eye Contact:  Good  Speech:  Pressured  Volume:  Increased  Mood:  Anxious  Affect:  Congruent  Thought Process:  Coherent  Orientation:  Full (Time, Place, and Person)  Thought Content:  Rumination and Tangential  Suicidal Thoughts:  No  Homicidal Thoughts:  No  Memory:  Immediate;   Fair Recent;   Fair Remote;   Fair  Judgement:  Impaired  Insight:  Lacking  Psychomotor Activity:  Restlessness  Concentration:  Concentration: Poor  Recall:  Poor  Fund of Knowledge:  Poor  Language:  Fair  Akathisia:  No  Handed:  Right  AIMS (if indicated):     Assets:  Desire for Improvement Financial Resources/Insurance Social Support  ADL's:  Impaired  Cognition:  Impaired,  Mild  Sleep:  Number of Hours: 6.75     Treatment Plan Summary: Daily contact with patient to assess and evaluate symptoms and progress in treatment, Medication management and Plan Benzodiazepines and his other medicines help with a certain degree of his agitation but they do not change his cognitions which are still getting in the way of placement.  Supportive counseling and therapy.  Urged patient to try and be more flexible.  Had a meeting on the phone with a group home manager today.  We are still looking for placement  Alethia Berthold, MD 03/26/2020, 3:49 PM

## 2020-03-26 NOTE — BHH Counselor (Signed)
CSW assisted the patient in completing an virtual/phone interview Event organiser with [facility], [phone].    Patient was able to participate, however was noted as being anxious as evidenced by trembling, rubbing on his thigh and statements.  Patient repeated himself several times with his "wants and desires" for his possible placement.  Patient reiterated several times that he needed to be in a home that provided him with access to the television so that he might be able to watch his programs.   He stated several times in the interview that he does not like being on the unit and being on the unit is worse than his previous group home due to lack of freedom.   Patient appears to lack the knowledge or comprehension on how his actions led him to the unit or on how to change his behaviors.   Assunta Curtis, MSW, LCSW 03/26/2020 4:01 PM

## 2020-03-26 NOTE — Plan of Care (Signed)
Patient calm and pleasant this evening, denying SI/HI/AVH and is without complaint   Problem: Education: Goal: Emotional status will improve Outcome: Progressing Goal: Mental status will improve Outcome: Progressing

## 2020-03-27 NOTE — Progress Notes (Signed)
Pt is alert and oriented to person, place, time, and situation. Pt is calm, cooperative, denies suicidal and homicidal ideation, denies feelings of depression and anxiety. When asked to describe his mood, pt states, "It's alright." Pt is pleasant, visible in the dayroom, social with staff and peers, is medication compliant. Pt attends groups, and participates in unit programming. No distress noted, none reported, pt voices no complaints. Will continue to monitor pt per Q15 minute face checks and monitor for safety and progress.

## 2020-03-27 NOTE — Progress Notes (Signed)
[facility] MD Progress Note  03/27/2020 3:39 PM Alexander Beard  MRN:  [id number] Subjective: Follow-up 64 year old man with developmental disability dementia cognitive impairment schizoaffective disorder.  His only complaint today is his anxiety wanting to know if he can still get peer support services in the future.  I told him I did not know but that we will try to find out.  Otherwise not complaining of any new major problems.  Physically stable.  Behavior fairly good today. Principal Problem: Schizoaffective disorder, bipolar type ([place]) Diagnosis: Principal Problem:   Schizoaffective disorder, bipolar type ([place]) Active Problems:   Active autistic disorder   Diabetes ([place])   Hypertension   Prostate hypertrophy   Intellectual disability   At risk for elopement  Total Time spent with patient: 30 minutes  Past Psychiatric History: Past history of chronic disability as above  Past Medical History:  Past Medical History:  Diagnosis Date  . Anxiety   . Anxiety disorder due to known physiological condition    HOSPITALIZED 10/18  . Arthritis   . Autistic disorder, residual state   . COPD (chronic obstructive pulmonary disease) ([place])   . Depression   . Developmental disorder   . Diabetes mellitus without complication ([place])   . Dyslipidemia   . Esophageal reflux   . HOH (hard of hearing)    MILDLY  . Hypertension   . Obesity   . Overactive bladder   . Palpitations    ANXIETY  . Schizophrenia, schizoaffective ([place])   . Sleep apnea   . Tremors of nervous system    HANDS DUE TO MEDICATIONS  . Urinary incontinence     Past Surgical History:  Procedure Laterality Date  . CATARACT EXTRACTION W/PHACO Left 11/14/2017   Procedure: CATARACT EXTRACTION PHACO AND INTRAOCULAR LENS PLACEMENT (IOC);  Surgeon: Birder Robson, MD;  Location: [facility] ORS;  Service: Ophthalmology;  Laterality: Left;  [place] 00:42 AP% 14.6 CDE 6.12 Fluid pack lot # [id number]  . COLONOSCOPY  01/28/2005  . COLONOSCOPY  WITH PROPOFOL N/A 05/09/2016   Procedure: COLONOSCOPY WITH PROPOFOL;  Surgeon: Manya Silvas, MD;  Location: [facility] ENDOSCOPY;  Service: Endoscopy;  Laterality: N/A;  . ESOPHAGOGASTRODUODENOSCOPY N/A 05/09/2016   Procedure: ESOPHAGOGASTRODUODENOSCOPY (EGD);  Surgeon: Manya Silvas, MD;  Location: [facility] ENDOSCOPY;  Service: Endoscopy;  Laterality: N/A;  . FRACTURE SURGERY     ORIF SHOULDER  . TONSILLECTOMY     Family History:  Family History  Problem Relation Age of Onset  . Hypertension Mother   . Stroke Father   . Heart Problems Father    Family Psychiatric  History: None Social History:  Social History   Substance and Sexual Activity  Alcohol Use No  . Alcohol/week: 0.0 standard drinks     Social History   Substance and Sexual Activity  Drug Use No    Social History   Socioeconomic History  . Marital status: Single    Spouse name: Not on file  . Number of children: Not on file  . Years of education: Not on file  . Highest education level: Not on file  Occupational History  . Not on file  Tobacco Use  . Smoking status: Never Smoker  . Smokeless tobacco: Never Used  Vaping Use  . Vaping Use: Never used  Substance and Sexual Activity  . Alcohol use: No    Alcohol/week: 0.0 standard drinks  . Drug use: No  . Sexual activity: Never  Other Topics Concern  . Not on file  Social  History Narrative   The patient never finished high school but did get his GED. 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 He is currently in disability and [name] is his legal guardian. He has been living in group homes for many years.      No pending legal charges   Social Determinants of Health   Financial Resource Strain:   . Difficulty of Paying Living Expenses: Not on file  Food Insecurity:   . Worried About Charity fundraiser in the Last Year: Not on file  . Ran Out of Food in the Last Year: Not on file   Transportation Needs:   . Lack of Transportation (Medical): Not on file  . Lack of Transportation (Non-Medical): Not on file  Physical Activity:   . Days of Exercise per Week: Not on file  . Minutes of Exercise per Session: Not on file  Stress:   . Feeling of Stress : Not on file  Social Connections:   . Frequency of Communication with Friends and Family: Not on file  . Frequency of Social Gatherings with Friends and Family: Not on file  . Attends Religious Services: Not on file  . Active Member of Clubs or Organizations: Not on file  . Attends Archivist Meetings: Not on file  . Marital Status: Not on file   Additional Social History:                         Sleep: Fair  Appetite:  Fair  Current Medications: Current Facility-Administered Medications  Medication Dose Route Frequency Provider Last Rate Last Admin  . acetaminophen (TYLENOL) tablet 650 mg  650 mg Oral Q6H PRN [name], MD      . alum & mag hydroxide-simeth (MAALOX/MYLANTA) 200-200-20 MG/5ML suspension 30 mL  30 mL Oral Q4H PRN [name], MD      . aspirin EC tablet 81 mg  81 mg Oral Daily Teagan Ozawa, [name], MD   81 mg at 03/27/20 0847  . clonazePAM (KLONOPIN) tablet 1 mg  1 mg Oral QID Shayli Altemose, [name], MD   1 mg at 03/27/20 1247  . docusate sodium (COLACE) capsule 100 mg  100 mg Oral BID Miette Molenda, [name], MD   100 mg at 03/27/20 0847  . gabapentin (NEURONTIN) capsule 100 mg  100 mg Oral TID Jazion Atteberry T, MD   100 mg at 03/27/20 1247  . hydrOXYzine (ATARAX/VISTARIL) tablet 10 mg  10 mg Oral TID PRN [name], MD   10 mg at 03/26/20 1404  . influenza vac split quadrivalent PF (FLUARIX) injection 0.5 mL  0.5 mL Intramuscular Tomorrow-1000 Janani Chamber T, MD      . lamoTRIgine (LAMICTAL) tablet 100 mg  100 mg Oral QHS Cambrey Lupi, [name], MD   100 mg at 03/26/20 2116  . linagliptin (TRADJENTA) tablet 5 mg  5 mg Oral Daily Maddox Hlavaty, [name], MD   5 mg at 03/27/20 0847  . loratadine  (CLARITIN) tablet 10 mg  10 mg Oral Daily Molly Maselli, [name], MD   10 mg at 03/27/20 0848  . LORazepam (ATIVAN) tablet 2 mg  2 mg Oral Q6H PRN Willette Mudry, [name], MD   2 mg at 03/27/20 1348  . magnesium hydroxide (MILK OF MAGNESIA) suspension 30 mL  30 mL Oral Daily PRN [name], MD   30 mL at 03/23/20 1344  .  metFORMIN (GLUCOPHAGE) tablet 850 mg  850 mg Oral BID WC Taz Vanness, [name], MD   850 mg at 03/27/20 0847  . metoprolol succinate (TOPROL-XL) 24 hr tablet 50 mg  50 mg Oral Daily Jacki Couse, [name], MD   50 mg at 03/27/20 0847  . oxybutynin (DITROPAN) tablet 10 mg  10 mg Oral BID Pamelyn Bancroft, [name], MD   10 mg at 03/27/20 0848  . pantoprazole (PROTONIX) EC tablet 40 mg  40 mg Oral Daily Lilley Hubble, [name], MD   40 mg at 03/27/20 0848  . prazosin (MINIPRESS) capsule 1 mg  1 mg Oral QHS Messiah Rovira T, MD   1 mg at 03/25/20 2106  . QUEtiapine (SEROQUEL) tablet 100 mg  100 mg Oral QID Tyjai Matuszak T, MD   100 mg at 03/27/20 1249  . QUEtiapine (SEROQUEL) tablet 400 mg  400 mg Oral QHS [name], NP   400 mg at 03/26/20 2115  . sertraline (ZOLOFT) tablet 100 mg  100 mg Oral BID Kannen Moxey, [name], MD   100 mg at 03/27/20 0848  . simethicone (MYLICON) chewable tablet 80 mg  80 mg Oral TID Tyniya Kuyper, [name], MD   80 mg at 03/27/20 1247  . simvastatin (ZOCOR) tablet 20 mg  20 mg Oral q1800 Idalys Konecny, [name], MD   20 mg at 03/26/20 1738  . tamsulosin (FLOMAX) capsule 0.8 mg  0.8 mg Oral QPC supper Waseem Suess, [name], MD   0.8 mg at 03/26/20 1738    Lab Results: No results found for this or any previous visit (from the past 48 hour(s)).  Blood Alcohol level:  Lab Results  Component Value Date   ETH <10 01/22/2020   ETH <10 76/73/4193    Metabolic Disorder Labs: Lab Results  Component Value Date   HGBA1C 6.6 (H) 02/20/2020   MPG 142.72 02/20/2020   No results found for: PROLACTIN Lab Results  Component Value Date   CHOL 153 04/10/2015   TRIG 166 (H) 04/10/2015   HDL 50 04/10/2015   CHOLHDL  3.1 04/10/2015   VLDL 33 04/10/2015   LDLCALC 70 04/10/2015    Physical Findings: AIMS:  , ,  ,  ,    CIWA:    COWS:     Musculoskeletal: Strength & Muscle Tone: within normal limits Gait & Station: normal Patient leans: N/A  Psychiatric Specialty Exam: Physical Exam Vitals and nursing note reviewed.  Constitutional:      Appearance: He is well-developed.  HENT:     Head: Normocephalic and atraumatic.  Eyes:     Conjunctiva/sclera: Conjunctivae normal.     Pupils: Pupils are equal, round, and reactive to light.  Cardiovascular:     Heart sounds: Normal heart sounds.  Pulmonary:     Effort: Pulmonary effort is normal.  Abdominal:     Palpations: Abdomen is soft.  Musculoskeletal:        General: Normal range of motion.     Cervical back: Normal range of motion.  Skin:    General: Skin is warm and dry.  Neurological:     General: No focal deficit present.     Mental Status: He is alert.  Psychiatric:        Attention and Perception: He is inattentive.        Mood and Affect: Affect is blunt.        Speech: Speech is rapid and pressured.        Behavior: Behavior is agitated. Behavior is  not aggressive.        Thought Content: Thought content does not include homicidal or suicidal ideation.        Cognition and Memory: Cognition is impaired.        Judgment: Judgment is inappropriate.     Review of Systems  Constitutional: Negative.   HENT: Negative.   Eyes: Negative.   Respiratory: Negative.   Cardiovascular: Negative.   Gastrointestinal: Negative.   Musculoskeletal: Negative.   Skin: Negative.   Neurological: Negative.   Psychiatric/Behavioral: Positive for confusion and dysphoric mood. Negative for self-injury and suicidal ideas.    Blood pressure 104/71, pulse 74, temperature 98.2 F (36.8 C), temperature source Oral, resp. rate 17, height 5\' 8"  (1.727 m), weight 104.3 kg, SpO2 98 %.Body mass index is 34.96 kg/m.  General Appearance: Casual  Eye  Contact:  Good  Speech:  Blocked  Volume:  Increased  Mood:  Euthymic  Affect:  Constricted  Thought Process:  Disorganized  Orientation:  Full (Time, Place, and Person)  Thought Content:  Illogical  Suicidal Thoughts:  No  Homicidal Thoughts:  No  Memory:  Immediate;   Fair Recent;   Fair Remote;   Fair  Judgement:  Impaired  Insight:  Shallow  Psychomotor Activity:  Decreased  Concentration:  Concentration: Fair  Recall:  [employer] of Knowledge:  Fair  Language:  Fair  Akathisia:  No  Handed:  Right  AIMS (if indicated):     Assets:  Desire for Improvement Physical Health Resilience  ADL's:  Impaired  Cognition:  Impaired,  Mild  Sleep:  Number of Hours: 6.5     Treatment Plan Summary: Plan No change to psychiatric medicine.  Psychoeducation and supportive counseling.  Continuing to search for placement  Alethia Berthold, MD 03/27/2020, 3:39 PM

## 2020-03-27 NOTE — BHH Counselor (Signed)
CSW received call from the patient's brother informing that Renato Shin with [facility], [phone] may be able to assist with placement at [facility].  He requested that CSW call Lorriane Shire to discuss further.   With pt's permission CSW contacted Lorriane Shire who confirmed that she and [facility] are working on the above placement.  She reports that she would need the FL2 and H&P to assist.   Patient consented.    Assunta Curtis, MSW, LCSW 03/27/2020 3:23 PM

## 2020-03-27 NOTE — Plan of Care (Signed)
  Problem: Group Participation °Goal: STG - Patient will engage in groups with a calm and appropriate mood at least 2x within 5 recreation therapy group sessions °Description: STG - Patient will engage in groups with a calm and appropriate mood at least 2x within 5 recreation therapy group sessions °Outcome: Progressing °  °

## 2020-03-27 NOTE — Progress Notes (Signed)
Recreation Therapy Notes   Date: 03/27/2020  Time: 9:30 am   Location: Craft room  Behavioral response: Appropriate  Intervention Topic: Self-care    Discussion/Intervention:  Group content today was focused on Self-Care. The group defined self-care and some positive ways they care for themselves. Individuals expressed ways and reasons why they neglected any self-care in the past. Patients described ways to improve self-care in the future. The group explained what could happen if they did not do any self-care activities at all. The group participated in the intervention "self-care assessment" where they had a chance to discover some of their weaknesses and strengths in self- care. Patient came up with a self-care plan to improve themselves in the future.    Clinical Observations/Feedback: Patient came to group and defined self-care as independence and taking care of yourself. He expressed that he would like to improve his self-care by having good behavior. Individual was social with peers and staff while participating in the intervention.  Alexander Beard LRT/CTRS          Alexander Beard 03/27/2020 11:14 AM

## 2020-03-27 NOTE — BHH Counselor (Signed)
CSW faxed FL2 and H&P to [facility] at [facility], [phone], fax [phone].  CSW received confirmation the fax was successful.  Assunta Curtis, MSW, LCSW 03/27/2020 4:30 PM

## 2020-03-28 NOTE — BHH Group Notes (Signed)
[facility] Group Notes:  (Nursing/MHT/Case Management/Adjunct)  Date:  03/28/2020  Time:  10:29 PM  Type of Therapy:  Group Therapy  Participation Level:  Active  Participation Quality:  Appropriate  Affect:  Appropriate  Cognitive:  Alert  Insight:  Good  Engagement in Group:  Engaged and his goal is to go to his new home, where he hope they cable tv were he can watch his special show and he want his peer support back, because he is confine here not getting out   Modes of Intervention:  Support  Summary of Progress/Problems:  Alexander Beard 03/28/2020, 10:29 PM

## 2020-03-28 NOTE — Plan of Care (Signed)
D- Patient alert and oriented. Patient presented in a worrying, but pleasant mood on assessment stating that he was "awfully tired this morning", however, it was reported that he slept seven hours last night. Patient had complaints of "I wet myself again". Patient denied any signs/symptoms of depression/anxiety, stating "well, it's ok I think". Patient also stated that overall, he is feeling "pretty good". Patient also denied SI, HI, AVH, and pain at this time. Patient's goal for today is "art, rest, staying under control, and to find peace".  A- Scheduled medications administered to patient, per MD orders. Support and encouragement provided.  Routine safety checks conducted every 15 minutes.  Patient informed to notify staff with problems or concerns.  R- No adverse drug reactions noted. Patient contracts for safety at this time. Patient compliant with medications and treatment plan. Patient receptive, calm, and cooperative. Patient interacts well with others on the unit.  Patient remains safe at this time.  Problem: Education: Goal: Knowledge of [facility] Education information/materials will improve Outcome: Progressing Goal: Emotional status will improve Outcome: Progressing Goal: Mental status will improve Outcome: Progressing Goal: Verbalization of understanding the information provided will improve Outcome: Progressing   Problem: Activity: Goal: Interest or engagement in activities will improve Outcome: Progressing Goal: Sleeping patterns will improve Outcome: Progressing   Problem: Coping: Goal: Ability to verbalize frustrations and anger appropriately will improve Outcome: Progressing Goal: Ability to demonstrate self-control will improve Outcome: Progressing   Problem: Health Behavior/Discharge Planning: Goal: Identification of resources available to assist in meeting health care needs will improve Outcome: Progressing Goal: Compliance with treatment plan for  underlying cause of condition will improve Outcome: Progressing   Problem: Physical Regulation: Goal: Ability to maintain clinical measurements within normal limits will improve Outcome: Progressing   Problem: Safety: Goal: Periods of time without injury will increase Outcome: Progressing   Problem: Education: Goal: Ability to make informed decisions regarding treatment will improve Outcome: Progressing   Problem: Coping: Goal: Coping ability will improve Outcome: Progressing   Problem: Health Behavior/Discharge Planning: Goal: Identification of resources available to assist in meeting health care needs will improve Outcome: Progressing   Problem: Medication: Goal: Compliance with prescribed medication regimen will improve Outcome: Progressing   Problem: Self-Concept: Goal: Ability to disclose and discuss suicidal ideas will improve Outcome: Progressing Goal: Will verbalize positive feelings about self Outcome: Progressing

## 2020-03-28 NOTE — BHH Group Notes (Signed)
LCSW Aftercare Discharge Planning Group Note   03/28/2020 1:30 PM - 2:30 PM   Type of Group and Topic: Psychoeducational Group:  Discharge Planning  Participation Level:  Active  Description of Group  Discharge planning group reviews patient's anticipated discharge plans and assists patients to anticipate and address any barriers to wellness/recovery in the community.  Suicide prevention education is reviewed with patients in group.  Therapeutic Goals 1. Patients will state their anticipated discharge plan and mental health aftercare 2. Patients will identify potential barriers to wellness in the community setting 3. Patients will engage in problem solving, solution focused discussion of ways to anticipate and address barriers to wellness/recovery  Summary of Patient Progress: Patient checked into group not feeling the best, but got some goods new from the Education officer, museum. Patient was able to speak about his discharge plan. Patient stated that he would like to get back to his TV shows, sing at the ballpark, spend time with his brother, and do fun things again. Patient stated that his insurance sometimes gets in the way of his treatment. Patient also mentioned not always having transportation to get out into the community. Patient stated having peer support might help with some of those barriers. Patient drew a picture of a unicorn for his vision board.    Plan for Discharge/Comments:  Patient would like to have peer support again and get out into the community   Transportation Means: Patient has issues with transportation and can't always get to the places he loves  Supports: Patient mentioned his brother as a support.   Therapeutic Modalities: Motivational Interviewing    [name], Latanya Presser 03/28/2020 4:39 PM

## 2020-03-28 NOTE — Progress Notes (Signed)
[facility] MD Progress Note  03/28/2020 12:27 PM LACOREY BRUSCA  MRN:  [id number]  Principal Problem: Schizoaffective disorder, bipolar type ([place]) Diagnosis: Principal Problem:   Schizoaffective disorder, bipolar type ([place]) Active Problems:   Active autistic disorder   Diabetes ([place])   Hypertension   Prostate hypertrophy   Intellectual disability   At risk for elopement  Mr.Finder is a 64 y.o. male pt that has a previous psychiatric history of Schizoaffective disorder who presents to the [facility] unit for treatment of psychosis.  Interval History Patient was seen today for re-evaluation.  Nursing reports no events overnight. The patient reports no issues with performing ADLs.  Patient has been medication compliant.  The patient reports no side effects from medications.  Current symptoms being addressed include: psychosis.  Since last assessment, patient reports symptoms have improved marginally.    SUBJECTIVE: On assessment patient reports feeling upset due to being still in the hospital, not able to spend time with his brother, not able to see his favorite TV shows. He wants to be reassured that after the hospital discharge he will be reconnected with peer support services, will have an access to [employer] cable TV and will be invited twice a year to sing a National Anthem at the local stadium. I told him we will try to find out about his peer support closer to his discharge and other requests will depend on his future placement and facility rules.   Reports feeling well overall, denies feeling depressed, anxious; denies suicidal or homicidal thoughts, denies any hallucinations.  Review Of Systems: A complete review of systems of the following systems was conducted (Constitutional, Psychiatric, Neurological, Musculoskeletal, Eyes, Gastrointestinal, Cardiovascular, Respiratory, Skin, and Endocrine). All reviewed systems are negative except pertinent positives identified in the HPI.  Labs: glucose 122.  Last  ECG was 9/4 - QTc 351ms, no change from 02/10/20.   Total Time spent with patient: 15 minutes  Past Psychiatric History: see H&P   Past Medical History:  Past Medical History:  Diagnosis Date  . Anxiety   . Anxiety disorder due to known physiological condition    HOSPITALIZED 10/18  . Arthritis   . Autistic disorder, residual state   . COPD (chronic obstructive pulmonary disease) ([place])   . Depression   . Developmental disorder   . Diabetes mellitus without complication ([place])   . Dyslipidemia   . Esophageal reflux   . HOH (hard of hearing)    MILDLY  . Hypertension   . Obesity   . Overactive bladder   . Palpitations    ANXIETY  . Schizophrenia, schizoaffective ([place])   . Sleep apnea   . Tremors of nervous system    HANDS DUE TO MEDICATIONS  . Urinary incontinence     Past Surgical History:  Procedure Laterality Date  . CATARACT EXTRACTION W/PHACO Left 11/14/2017   Procedure: CATARACT EXTRACTION PHACO AND INTRAOCULAR LENS PLACEMENT (IOC);  Surgeon: Birder Robson, MD;  Location: [facility] ORS;  Service: Ophthalmology;  Laterality: Left;  [place] 00:42 AP% 14.6 CDE 6.12 Fluid pack lot # [id number]  . COLONOSCOPY  01/28/2005  . COLONOSCOPY WITH PROPOFOL N/A 05/09/2016   Procedure: COLONOSCOPY WITH PROPOFOL;  Surgeon: Manya Silvas, MD;  Location: [facility] ENDOSCOPY;  Service: Endoscopy;  Laterality: N/A;  . ESOPHAGOGASTRODUODENOSCOPY N/A 05/09/2016   Procedure: ESOPHAGOGASTRODUODENOSCOPY (EGD);  Surgeon: Manya Silvas, MD;  Location: [facility] ENDOSCOPY;  Service: Endoscopy;  Laterality: N/A;  . FRACTURE SURGERY     ORIF SHOULDER  . TONSILLECTOMY  Family History:  Family History  Problem Relation Age of Onset  . Hypertension Mother   . Stroke Father   . Heart Problems Father    Family Psychiatric  History: see H&P Social History   Substance and Sexual Activity  Alcohol Use No  . Alcohol/week: 0.0 standard drinks     Social History   Substance and Sexual Activity  Drug  Use No    Social History   Socioeconomic History  . Marital status: Single    Spouse name: Not on file  . Number of children: Not on file  . Years of education: Not on file  . Highest education level: Not on file  Occupational History  . Not on file  Tobacco Use  . Smoking status: Never Smoker  . Smokeless tobacco: Never Used  Vaping Use  . Vaping Use: Never used  Substance and Sexual Activity  . Alcohol use: No    Alcohol/week: 0.0 standard drinks  . Drug use: No  . Sexual activity: Never  Other Topics Concern  . Not on file  Social History Narrative   The patient never finished high school but did get his GED. 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 He is currently in disability and [name] is his legal guardian. He has been living in group homes for many years.      No pending legal charges   Social Determinants of Health   Financial Resource Strain:   . Difficulty of Paying Living Expenses: Not on file  Food Insecurity:   . Worried About Charity fundraiser in the Last Year: Not on file  . Ran Out of Food in the Last Year: Not on file  Transportation Needs:   . Lack of Transportation (Medical): Not on file  . Lack of Transportation (Non-Medical): Not on file  Physical Activity:   . Days of Exercise per Week: Not on file  . Minutes of Exercise per Session: Not on file  Stress:   . Feeling of Stress : Not on file  Social Connections:   . Frequency of Communication with Friends and Family: Not on file  . Frequency of Social Gatherings with Friends and Family: Not on file  . Attends Religious Services: Not on file  . Active Member of Clubs or Organizations: Not on file  . Attends Archivist Meetings: Not on file  . Marital Status: Not on file   Additional Social History:                         Sleep: Fair  Appetite:  Fair  Current Medications: Current  Facility-Administered Medications  Medication Dose Route Frequency Provider Last Rate Last Admin  . acetaminophen (TYLENOL) tablet 650 mg  650 mg Oral Q6H PRN [name], MD      . alum & mag hydroxide-simeth (MAALOX/MYLANTA) 200-200-20 MG/5ML suspension 30 mL  30 mL Oral Q4H PRN [name], MD      . aspirin EC tablet 81 mg  81 mg Oral Daily [name], [name], MD   81 mg at 03/28/20 0844  . clonazePAM (KLONOPIN) tablet 1 mg  1 mg Oral QID [name], [name], MD   1 mg at 03/28/20 0843  . docusate sodium (COLACE) capsule 100 mg  100 mg Oral BID [name], [name], MD   100 mg at 03/28/20 0844  . gabapentin (NEURONTIN)  capsule 100 mg  100 mg Oral TID [name], [name], MD   100 mg at 03/28/20 0844  . hydrOXYzine (ATARAX/VISTARIL) tablet 10 mg  10 mg Oral TID PRN [name], MD   10 mg at 03/26/20 1404  . influenza vac split quadrivalent PF (FLUARIX) injection 0.5 mL  0.5 mL Intramuscular Tomorrow-1000 [name], [name], MD      . lamoTRIgine (LAMICTAL) tablet 100 mg  100 mg Oral QHS [name], [name], MD   100 mg at 03/27/20 2156  . linagliptin (TRADJENTA) tablet 5 mg  5 mg Oral Daily [name], [name], MD   5 mg at 03/28/20 0843  . loratadine (CLARITIN) tablet 10 mg  10 mg Oral Daily [name], [name], MD   10 mg at 03/28/20 0844  . LORazepam (ATIVAN) tablet 2 mg  2 mg Oral Q6H PRN [name], [name], MD   2 mg at 03/28/20 1038  . magnesium hydroxide (MILK OF MAGNESIA) suspension 30 mL  30 mL Oral Daily PRN [name], MD   30 mL at 03/23/20 1344  . metFORMIN (GLUCOPHAGE) tablet 850 mg  850 mg Oral BID WC [name], [name], MD   850 mg at 03/28/20 0844  . metoprolol succinate (TOPROL-XL) 24 hr tablet 50 mg  50 mg Oral Daily [name], [name], MD   50 mg at 03/27/20 0847  . oxybutynin (DITROPAN) tablet 10 mg  10 mg Oral BID [name], [name], MD   10 mg at 03/28/20 0844  . pantoprazole (PROTONIX) EC tablet 40 mg  40 mg Oral Daily [name], [name], MD   40 mg at 03/28/20 0844  . prazosin (MINIPRESS)  capsule 1 mg  1 mg Oral QHS [name], [name], MD   1 mg at 03/27/20 2156  . QUEtiapine (SEROQUEL) tablet 100 mg  100 mg Oral QID [name], [name], MD   100 mg at 03/28/20 0843  . QUEtiapine (SEROQUEL) tablet 400 mg  400 mg Oral QHS [name], NP   400 mg at 03/27/20 2156  . sertraline (ZOLOFT) tablet 100 mg  100 mg Oral BID [name], [name], MD   100 mg at 03/28/20 0844  . simethicone (MYLICON) chewable tablet 80 mg  80 mg Oral TID [name], [name], MD   80 mg at 03/28/20 0844  . simvastatin (ZOCOR) tablet 20 mg  20 mg Oral q1800 [name], [name], MD   20 mg at 03/27/20 1655  . tamsulosin (FLOMAX) capsule 0.8 mg  0.8 mg Oral QPC supper [name], [name], MD   0.8 mg at 03/27/20 1655    Lab Results:  No results found for this or any previous visit (from the past 48 hour(s)).  Blood Alcohol level:  Lab Results  Component Value Date   ETH <10 01/22/2020   ETH <10 16/04/9603    Metabolic Disorder Labs: Lab Results  Component Value Date   HGBA1C 6.6 (H) 02/20/2020   MPG 142.72 02/20/2020   No results found for: PROLACTIN Lab Results  Component Value Date   CHOL 153 04/10/2015   TRIG 166 (H) 04/10/2015   HDL 50 04/10/2015   CHOLHDL 3.1 04/10/2015   VLDL 33 04/10/2015   LDLCALC 70 04/10/2015    Physical Findings: AIMS:  , ,  ,  ,    CIWA:    COWS:     Musculoskeletal: Strength & Muscle Tone: within normal limits Gait & Station: normal Patient leans: N/A  Psychiatric Specialty Exam: Physical Exam  Review of Systems   Blood pressure 129/82, pulse (!) 56, temperature 98.5 F (36.9 C), temperature source Oral, resp. rate 18, height 5\' 8"  (1.727 m), weight 104.3 kg, SpO2 99 %.Body mass index is 34.96 kg/m.  General Appearance: Bizarre  Eye Contact:  Fair  Speech:  Pressured  Volume:  Normal  Mood:  Euthymic  Affect:  Constricted  Thought Process:  Coherent  Orientation:  Full (Time, Place, and Person)  Thought Content:  Illogical  Suicidal Thoughts:  No   Homicidal Thoughts:  No  Memory:  NA  Judgement:  Impaired  Insight:  Shallow  Psychomotor Activity:  Increased  Concentration:  Concentration: Poor and Attention Span: Poor  Recall:  [employer] of Knowledge:  Fair  Language:  Fair  Akathisia:  No  Handed:  Right  AIMS (if indicated):     Assets:  Communication Skills Desire for Improvement  ADL's:  Intact  Cognition:  Impaired,  Mild  Sleep:  Number of Hours: 7     Treatment Plan Summary: Daily contact with patient to assess and evaluate symptoms and progress in treatment and Medication management   Patient is a 64 year old male with the above-stated past psychiatric history who is seen in follow-up.  Chart reviewed. Patient discussed with nursing. Patient appears stable, likely at his mental baseline. He is awaiting placement. Referrals sent by SW.    Plan:  -continue inpatient psych admission; 15-minute checks; daily contact with patient to assess and evaluate symptoms and progress in treatment; psychoeducation.  -continue scheduled psych medications: . aspirin EC  81 mg Oral Daily  . clonazePAM  1 mg Oral QID  . docusate sodium  100 mg Oral BID  . gabapentin  100 mg Oral TID  . influenza vac split quadrivalent PF  0.5 mL Intramuscular Tomorrow-1000  . lamoTRIgine  100 mg Oral QHS  . linagliptin  5 mg Oral Daily  . loratadine  10 mg Oral Daily  . metFORMIN  850 mg Oral BID WC  . metoprolol succinate  50 mg Oral Daily  . oxybutynin  10 mg Oral BID  . pantoprazole  40 mg Oral Daily  . prazosin  1 mg Oral QHS  . QUEtiapine  100 mg Oral QID  . QUEtiapine  400 mg Oral QHS  . sertraline  100 mg Oral BID  . simethicone  80 mg Oral TID  . simvastatin  20 mg Oral q1800  . tamsulosin  0.8 mg Oral QPC supper   -continue PRN medications. acetaminophen, alum & mag hydroxide-simeth, hydrOXYzine, LORazepam, magnesium hydroxide  -Disposition: to be determined.  Continuing to search for placement.  Larita Fife,  MD 03/28/2020, 12:27 PM

## 2020-03-28 NOTE — Progress Notes (Signed)
Pt easily aroused this morning. He was incontinent of bladder and was given hygiene supplies to complete his ADL's at his convenience and he verbalized understanding. No falls or unsafe behavior noted thus far.

## 2020-03-28 NOTE — Plan of Care (Signed)
Pt observed in the dayroom coloring with no unsafe behavior. He was compliant with POC, meds and unit regulations. He declined any issues other than getting hygiene supplies. Flu shot not administered as it was not available from pharmacy (will pass it to oncoming shift.) No falls or report of SE noted thus far. He is currently resting in bed with + even and unlabored respirations. Q15 minutes observations maintained for safety and support provided as needed.  Problem: Education: Goal: Emotional status will improve Outcome: Progressing Goal: Mental status will improve Outcome: Progressing   Problem: Activity: Goal: Interest or engagement in activities will improve Outcome: Progressing   Problem: Coping: Goal: Ability to verbalize frustrations and anger appropriately will improve Outcome: Progressing   Problem: Medication: Goal: Compliance with prescribed medication regimen will improve Outcome: Progressing

## 2020-03-29 NOTE — Progress Notes (Signed)
[facility] MD Progress Note  03/29/2020 9:54 AM Alexander Beard  MRN:  [id number]  Principal Problem: Schizoaffective disorder, bipolar type ([place]) Diagnosis: Principal Problem:   Schizoaffective disorder, bipolar type ([place]) Active Problems:   Active autistic disorder   Diabetes ([place])   Hypertension   Prostate hypertrophy   Intellectual disability   At risk for elopement  Alexander Beard is a 64 y.o. male pt that has a previous psychiatric history of Schizoaffective disorder who presents to the [facility] unit for treatment of psychosis.  Interval History Patient was seen today for re-evaluation.  Nursing reports no events overnight. The patient reports no issues with performing ADLs.  Patient has been medication compliant.  The patient reports no side effects from medications.    SUBJECTIVE: On assessment patient reports feeling "better today", still concerned about "nursing students don`t come to unit anymore". He is focused on "I want to spend more time with my brother", "I will wright down my favorite TV shows", and wants to be reconnected with peer support services. Reports feeling well overall, denies feeling depressed, anxious; denies suicidal or homicidal thoughts, denies any hallucinations.   Total Time spent with patient: 15 minutes  Past Psychiatric History: see H&P   Past Medical History:  Past Medical History:  Diagnosis Date  . Anxiety   . Anxiety disorder due to known physiological condition    HOSPITALIZED 10/18  . Arthritis   . Autistic disorder, residual state   . COPD (chronic obstructive pulmonary disease) ([place])   . Depression   . Developmental disorder   . Diabetes mellitus without complication ([place])   . Dyslipidemia   . Esophageal reflux   . HOH (hard of hearing)    MILDLY  . Hypertension   . Obesity   . Overactive bladder   . Palpitations    ANXIETY  . Schizophrenia, schizoaffective ([place])   . Sleep apnea   . Tremors of nervous system    HANDS DUE TO MEDICATIONS  .  Urinary incontinence     Past Surgical History:  Procedure Laterality Date  . CATARACT EXTRACTION W/PHACO Left 11/14/2017   Procedure: CATARACT EXTRACTION PHACO AND INTRAOCULAR LENS PLACEMENT (IOC);  Surgeon: Birder Robson, MD;  Location: [facility] ORS;  Service: Ophthalmology;  Laterality: Left;  [place] 00:42 AP% 14.6 CDE 6.12 Fluid pack lot # [id number]  . COLONOSCOPY  01/28/2005  . COLONOSCOPY WITH PROPOFOL N/A 05/09/2016   Procedure: COLONOSCOPY WITH PROPOFOL;  Surgeon: Manya Silvas, MD;  Location: [facility] ENDOSCOPY;  Service: Endoscopy;  Laterality: N/A;  . ESOPHAGOGASTRODUODENOSCOPY N/A 05/09/2016   Procedure: ESOPHAGOGASTRODUODENOSCOPY (EGD);  Surgeon: Manya Silvas, MD;  Location: [facility] ENDOSCOPY;  Service: Endoscopy;  Laterality: N/A;  . FRACTURE SURGERY     ORIF SHOULDER  . TONSILLECTOMY     Family History:  Family History  Problem Relation Age of Onset  . Hypertension Mother   . Stroke Father   . Heart Problems Father    Family Psychiatric  History: see H&P Social History   Substance and Sexual Activity  Alcohol Use No  . Alcohol/week: 0.0 standard drinks     Social History   Substance and Sexual Activity  Drug Use No    Social History   Socioeconomic History  . Marital status: Single    Spouse name: Not on file  . Number of children: Not on file  . Years of education: Not on file  . Highest education level: Not on file  Occupational History  . Not on file  Tobacco Use  . Smoking status: Never Smoker  . Smokeless tobacco: Never Used  Vaping Use  . Vaping Use: Never used  Substance and Sexual Activity  . Alcohol use: No    Alcohol/week: 0.0 standard drinks  . Drug use: No  . Sexual activity: Never  Other Topics Concern  . Not on file  Social History Narrative   The patient never finished high school but did get his GED. 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 He is currently in disability and  his brother Alexander Beard is his legal guardian. He has been living in group homes for many years.      No pending legal charges   Social Determinants of Health   Financial Resource Strain:   . Difficulty of Paying Living Expenses: Not on file  Food Insecurity:   . Worried About Charity fundraiser in the Last Year: Not on file  . Ran Out of Food in the Last Year: Not on file  Transportation Needs:   . Lack of Transportation (Medical): Not on file  . Lack of Transportation (Non-Medical): Not on file  Physical Activity:   . Days of Exercise per Week: Not on file  . Minutes of Exercise per Session: Not on file  Stress:   . Feeling of Stress : Not on file  Social Connections:   . Frequency of Communication with Friends and Family: Not on file  . Frequency of Social Gatherings with Friends and Family: Not on file  . Attends Religious Services: Not on file  . Active Member of Clubs or Organizations: Not on file  . Attends Archivist Meetings: Not on file  . Marital Status: Not on file   Additional Social History:                         Sleep: Fair  Appetite:  Fair  Current Medications: Current Facility-Administered Medications  Medication Dose Route Frequency Provider Last Rate Last Admin  . acetaminophen (TYLENOL) tablet 650 mg  650 mg Oral Q6H PRN [name], MD      . alum & mag hydroxide-simeth (MAALOX/MYLANTA) 200-200-20 MG/5ML suspension 30 mL  30 mL Oral Q4H PRN [name], MD      . aspirin EC tablet 81 mg  81 mg Oral Daily [name], [name], MD   81 mg at 03/29/20 0829  . clonazePAM (KLONOPIN) tablet 1 mg  1 mg Oral QID [name], [name], MD   1 mg at 03/29/20 0829  . docusate sodium (COLACE) capsule 100 mg  100 mg Oral BID [name], [name], MD   100 mg at 03/29/20 0829  . gabapentin (NEURONTIN) capsule 100 mg  100 mg Oral TID [name], [name], MD   100 mg at 03/29/20 0830  . hydrOXYzine (ATARAX/VISTARIL) tablet 10 mg  10 mg Oral TID PRN [name], MD   10 mg at 03/26/20 1404  . influenza vac split quadrivalent PF (FLUARIX) injection 0.5 mL  0.5 mL Intramuscular Tomorrow-1000 [name], [name], MD      . lamoTRIgine (LAMICTAL) tablet 100 mg  100 mg Oral QHS [name], [name], MD   100 mg at 03/28/20 2136  . linagliptin (TRADJENTA) tablet 5 mg  5 mg Oral Daily [name], [name], MD   5 mg at 03/29/20 0829  . loratadine (CLARITIN) tablet 10 mg  10 mg Oral Daily  [name], [name], MD   10 mg at 03/29/20 0830  . LORazepam (ATIVAN) tablet 2 mg  2 mg Oral Q6H PRN [name], [name], MD   2 mg at 03/28/20 1038  . magnesium hydroxide (MILK OF MAGNESIA) suspension 30 mL  30 mL Oral Daily PRN [name], MD   30 mL at 03/23/20 1344  . metFORMIN (GLUCOPHAGE) tablet 850 mg  850 mg Oral BID WC [name], [name], MD   850 mg at 03/29/20 0829  . metoprolol succinate (TOPROL-XL) 24 hr tablet 50 mg  50 mg Oral Daily [name], [name], MD   50 mg at 03/29/20 0830  . oxybutynin (DITROPAN) tablet 10 mg  10 mg Oral BID [name], [name], MD   10 mg at 03/29/20 0830  . pantoprazole (PROTONIX) EC tablet 40 mg  40 mg Oral Daily [name], [name], MD   40 mg at 03/29/20 0829  . prazosin (MINIPRESS) capsule 1 mg  1 mg Oral QHS [name], [name], MD   1 mg at 03/27/20 2156  . QUEtiapine (SEROQUEL) tablet 100 mg  100 mg Oral QID [name], [name], MD   100 mg at 03/29/20 0830  . QUEtiapine (SEROQUEL) tablet 400 mg  400 mg Oral QHS [name], NP   400 mg at 03/28/20 2137  . sertraline (ZOLOFT) tablet 100 mg  100 mg Oral BID [name], [name], MD   100 mg at 03/29/20 0829  . simethicone (MYLICON) chewable tablet 80 mg  80 mg Oral TID [name], [name], MD   80 mg at 03/29/20 0830  . simvastatin (ZOCOR) tablet 20 mg  20 mg Oral q1800 [name], [name], MD   20 mg at 03/28/20 1718  . tamsulosin (FLOMAX) capsule 0.8 mg  0.8 mg Oral QPC supper [name], [name], MD   0.8 mg at 03/28/20 1718    Lab Results:  No results found for this or any previous visit (from the past 48  hour(s)).  Blood Alcohol level:  Lab Results  Component Value Date   ETH <10 01/22/2020   ETH <10 44/31/5400    Metabolic Disorder Labs: Lab Results  Component Value Date   HGBA1C 6.6 (H) 02/20/2020   MPG 142.72 02/20/2020   No results found for: PROLACTIN Lab Results  Component Value Date   CHOL 153 04/10/2015   TRIG 166 (H) 04/10/2015   HDL 50 04/10/2015   CHOLHDL 3.1 04/10/2015   VLDL 33 04/10/2015   LDLCALC 70 04/10/2015    Physical Findings: AIMS:  , ,  ,  ,    CIWA:    COWS:     Musculoskeletal: Strength & Muscle Tone: within normal limits Gait & Station: normal Patient leans: N/A  Psychiatric Specialty Exam: Physical Exam   Review of Systems   Blood pressure 113/72, pulse 76, temperature 98.5 F (36.9 C), temperature source Oral, resp. rate 18, height 5\' 8"  (1.727 m), weight 104.3 kg, SpO2 99 %.Body mass index is 34.96 kg/m.  General Appearance: Bizarre  Eye Contact:  Fair  Speech:  Pressured  Volume:  Normal  Mood:  Euthymic  Affect:  Constricted  Thought Process:  Coherent  Orientation:  Full (Time, Place, and Person)  Thought Content:  Illogical  Suicidal Thoughts:  No  Homicidal Thoughts:  No  Memory:  NA  Judgement:  Impaired  Insight:  Shallow  Psychomotor Activity:  Increased  Concentration:  Concentration: Poor and Attention Span: Poor  Recall:  [employer] of  Knowledge:  Fair  Language:  Fair  Akathisia:  No  Handed:  Right  AIMS (if indicated):     Assets:  Communication Skills Desire for Improvement  ADL's:  Intact  Cognition:  Impaired,  Mild  Sleep:  Number of Hours: 5.25     Treatment Plan Summary: Daily contact with patient to assess and evaluate symptoms and progress in treatment and Medication management   Patient is a 64 year old male with the above-stated past psychiatric history who is seen in follow-up.  Chart reviewed. Patient discussed with nursing. Patient appears stable, likely at his mental baseline. He is  awaiting placement. Referrals sent by SW.    Plan:  -continue inpatient psych admission; 15-minute checks; daily contact with patient to assess and evaluate symptoms and progress in treatment; psychoeducation.  -continue scheduled psych medications: . aspirin EC  81 mg Oral Daily  . clonazePAM  1 mg Oral QID  . docusate sodium  100 mg Oral BID  . gabapentin  100 mg Oral TID  . influenza vac split quadrivalent PF  0.5 mL Intramuscular Tomorrow-1000  . lamoTRIgine  100 mg Oral QHS  . linagliptin  5 mg Oral Daily  . loratadine  10 mg Oral Daily  . metFORMIN  850 mg Oral BID WC  . metoprolol succinate  50 mg Oral Daily  . oxybutynin  10 mg Oral BID  . pantoprazole  40 mg Oral Daily  . prazosin  1 mg Oral QHS  . QUEtiapine  100 mg Oral QID  . QUEtiapine  400 mg Oral QHS  . sertraline  100 mg Oral BID  . simethicone  80 mg Oral TID  . simvastatin  20 mg Oral q1800  . tamsulosin  0.8 mg Oral QPC supper   -continue PRN medications. acetaminophen, alum & mag hydroxide-simeth, hydrOXYzine, LORazepam, magnesium hydroxide  -Disposition: to be determined.  Continuing to search for placement.  Larita Fife, MD 03/29/2020, 9:54 AM

## 2020-03-29 NOTE — Progress Notes (Signed)
Patient was given his afternoon medication after social work group. Patient was eating lunch throughout the afternoon hour, and group was called at one o'clock, which he attended.

## 2020-03-29 NOTE — [facility] Group Notes (Signed)
LCSW Group Therapy Note  03/29/2020   1:08 PM- 1:52 PM   Type of Therapy and Topic:  Group Therapy: Anger Cues and Responses  Participation Level:  Active   Description of Group:   In this group, patients learned how to recognize the physical, cognitive, emotional, and behavioral responses they have to anger-provoking situations.  They identified a recent time they became angry and how they reacted.  They analyzed how their reaction was possibly beneficial and how it was possibly unhelpful.  The group discussed a variety of healthier coping skills that could help with such a situation in the future.  Focus was placed on how helpful it is to recognize the underlying emotions to our anger, because working on those can lead to a more permanent solution as well as our ability to focus on the important rather than the urgent.  Therapeutic Goals: 1. Patients will remember their last incident of anger and how they felt emotionally and physically, what their thoughts were at the time, and how they behaved. 2. Patients will identify how their behavior at that time worked for them, as well as how it worked against them. 3. Patients will explore possible new behaviors to use in future anger situations. 4. Patients will learn that anger itself is normal and cannot be eliminated, and that healthier reactions can assist with resolving conflict rather than worsening situations.  Summary of Patient Progress:  Patient checked into group feeling pretty good. Patient spoke about being in the hospital was the last time he felt angry. Patient stated that he needs to get out of here and he doesn't know how long he can take it. Patient stated that he does not like being caged up. Patient stated he likes to go outside and wishes he could do that more. Patient stated he would like to get back to his peer support. Patient stated doing art and writing things are his coping skills.   Therapeutic Modalities:   Cognitive  Behavioral Therapy    [name], LCSWA 03/29/2020  2:52 PM

## 2020-03-29 NOTE — Plan of Care (Signed)
D- Patient alert and oriented. Patient presents in a worried, but pleasant mood on assessment stating that he slept ok last night and had no complaints to voice to this Probation officer. Patient endorsed both depression/anxiety stating that "being here and not having my freedom to go out and do what I want to do". Patient also stated that "when the student nurses don't come, that makes me sad". Patient is worried about if he still has "per support from [place] when I leave here". Patient denies SI, HI, AVH, and pain at this time. Patient's goal for today is "self-control, art, rest, being busy, involvement, dealing with stress, and discharge", in which he will "take breaks from too much work and concentration, and enjoy yourself", will help him achieve his goal.   A- Scheduled medications administered to patient, per MD orders. Support and encouragement provided.  Routine safety checks conducted every 15 minutes.  Patient informed to notify staff with problems or concerns.  R- No adverse drug reactions noted. Patient contracts for safety at this time. Patient compliant with medications and treatment plan. Patient receptive, calm, and cooperative. Patient interacts well with others on the unit.  Patient remains safe at this time.  Problem: Education: Goal: Knowledge of [place] [facility] Education information/materials will improve Outcome: Progressing Goal: Emotional status will improve Outcome: Progressing Goal: Mental status will improve Outcome: Progressing Goal: Verbalization of understanding the information provided will improve Outcome: Progressing   Problem: Activity: Goal: Interest or engagement in activities will improve Outcome: Progressing Goal: Sleeping patterns will improve Outcome: Progressing   Problem: Coping: Goal: Ability to verbalize frustrations and anger appropriately will improve Outcome: Progressing Goal: Ability to demonstrate self-control will improve Outcome: Progressing    Problem: Health Behavior/Discharge Planning: Goal: Identification of resources available to assist in meeting health care needs will improve Outcome: Progressing Goal: Compliance with treatment plan for underlying cause of condition will improve Outcome: Progressing   Problem: Physical Regulation: Goal: Ability to maintain clinical measurements within normal limits will improve Outcome: Progressing   Problem: Safety: Goal: Periods of time without injury will increase Outcome: Progressing   Problem: Education: Goal: Ability to make informed decisions regarding treatment will improve Outcome: Progressing   Problem: Coping: Goal: Coping ability will improve Outcome: Progressing   Problem: Health Behavior/Discharge Planning: Goal: Identification of resources available to assist in meeting health care needs will improve Outcome: Progressing   Problem: Medication: Goal: Compliance with prescribed medication regimen will improve Outcome: Progressing   Problem: Self-Concept: Goal: Ability to disclose and discuss suicidal ideas will improve Outcome: Progressing Goal: Will verbalize positive feelings about self Outcome: Progressing

## 2020-03-29 NOTE — Plan of Care (Signed)
  Problem: Education: Goal: Knowledge of [place] [facility] Education information/materials will improve Outcome: Progressing Goal: Emotional status will improve Outcome: Progressing Goal: Mental status will improve Outcome: Progressing Goal: Verbalization of understanding the information provided will improve Outcome: Progressing   

## 2020-03-29 NOTE — Progress Notes (Signed)
Patient has continued to be anxious. Tried to contact his niece and was unable to reach her. Denies SI, HI and AVH. Nervous about placement

## 2020-03-30 NOTE — Progress Notes (Signed)
Patient talking about his television shows and how important it is for him to see certain shows that come on at certain times. Is still fixated on contacting his niece and making sure he has peer support. Reiterates that he does not want to go to a group home, he wants to have his own place so he has his freedom.Denies SI, HI and AVH

## 2020-03-30 NOTE — Progress Notes (Signed)
[facility] MD Progress Note  03/30/2020 4:28 PM Alexander Beard  MRN:  [id number] Subjective: Follow-up patient with developmental disability and schizoaffective disorder.  Patient has no new complaints.  Denies suicidal or homicidal thought.  Generally cooperative.  Physically stable Principal Problem: Schizoaffective disorder, bipolar type ([place]) Diagnosis: Principal Problem:   Schizoaffective disorder, bipolar type ([place]) Active Problems:   Active autistic disorder   Diabetes ([place])   Hypertension   Prostate hypertrophy   Intellectual disability   At risk for elopement  Total Time spent with patient: 30 minutes  Past Psychiatric History: Past history of longstanding disability  Past Medical History:  Past Medical History:  Diagnosis Date  . Anxiety   . Anxiety disorder due to known physiological condition    HOSPITALIZED 10/18  . Arthritis   . Autistic disorder, residual state   . COPD (chronic obstructive pulmonary disease) ([place])   . Depression   . Developmental disorder   . Diabetes mellitus without complication ([place])   . Dyslipidemia   . Esophageal reflux   . HOH (hard of hearing)    MILDLY  . Hypertension   . Obesity   . Overactive bladder   . Palpitations    ANXIETY  . Schizophrenia, schizoaffective ([place])   . Sleep apnea   . Tremors of nervous system    HANDS DUE TO MEDICATIONS  . Urinary incontinence     Past Surgical History:  Procedure Laterality Date  . CATARACT EXTRACTION W/PHACO Left 11/14/2017   Procedure: CATARACT EXTRACTION PHACO AND INTRAOCULAR LENS PLACEMENT (IOC);  Surgeon: Birder Robson, MD;  Location: [facility] ORS;  Service: Ophthalmology;  Laterality: Left;  [place] 00:42 AP% 14.6 CDE 6.12 Fluid pack lot # [id number]  . COLONOSCOPY  01/28/2005  . COLONOSCOPY WITH PROPOFOL N/A 05/09/2016   Procedure: COLONOSCOPY WITH PROPOFOL;  Surgeon: Manya Silvas, MD;  Location: [facility] ENDOSCOPY;  Service: Endoscopy;  Laterality: N/A;  . ESOPHAGOGASTRODUODENOSCOPY N/A  05/09/2016   Procedure: ESOPHAGOGASTRODUODENOSCOPY (EGD);  Surgeon: Manya Silvas, MD;  Location: [facility] ENDOSCOPY;  Service: Endoscopy;  Laterality: N/A;  . FRACTURE SURGERY     ORIF SHOULDER  . TONSILLECTOMY     Family History:  Family History  Problem Relation Age of Onset  . Hypertension Mother   . Stroke Father   . Heart Problems Father    Family Psychiatric  History: See previous Social History:  Social History   Substance and Sexual Activity  Alcohol Use No  . Alcohol/week: 0.0 standard drinks     Social History   Substance and Sexual Activity  Drug Use No    Social History   Socioeconomic History  . Marital status: Single    Spouse name: Not on file  . Number of children: Not on file  . Years of education: Not on file  . Highest education level: Not on file  Occupational History  . Not on file  Tobacco Use  . Smoking status: Never Smoker  . Smokeless tobacco: Never Used  Vaping Use  . Vaping Use: Never used  Substance and Sexual Activity  . Alcohol use: No    Alcohol/week: 0.0 standard drinks  . Drug use: No  . Sexual activity: Never  Other Topics Concern  . Not on file  Social History Narrative   The patient never finished high school but did get his GED. 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 He is currently in disability and his  brother [name] is his legal guardian. He has been living in group homes for many years.      No pending legal charges   Social Determinants of Health   Financial Resource Strain:   . Difficulty of Paying Living Expenses: Not on file  Food Insecurity:   . Worried About Charity fundraiser in the Last Year: Not on file  . Ran Out of Food in the Last Year: Not on file  Transportation Needs:   . Lack of Transportation (Medical): Not on file  . Lack of Transportation (Non-Medical): Not on file  Physical Activity:   . Days of Exercise per Week: Not on file  .  Minutes of Exercise per Session: Not on file  Stress:   . Feeling of Stress : Not on file  Social Connections:   . Frequency of Communication with Friends and Family: Not on file  . Frequency of Social Gatherings with Friends and Family: Not on file  . Attends Religious Services: Not on file  . Active Member of Clubs or Organizations: Not on file  . Attends Archivist Meetings: Not on file  . Marital Status: Not on file   Additional Social History:                         Sleep: Fair  Appetite:  Fair  Current Medications: Current Facility-Administered Medications  Medication Dose Route Frequency Provider Last Rate Last Admin  . acetaminophen (TYLENOL) tablet 650 mg  650 mg Oral Q6H PRN [name], MD      . alum & mag hydroxide-simeth (MAALOX/MYLANTA) 200-200-20 MG/5ML suspension 30 mL  30 mL Oral Q4H PRN [name], MD      . aspirin EC tablet 81 mg  81 mg Oral Daily Shelly Spenser T, MD   81 mg at 03/30/20 0900  . clonazePAM (KLONOPIN) tablet 1 mg  1 mg Oral QID Ritta Hammes, [name], MD   1 mg at 03/30/20 1250  . docusate sodium (COLACE) capsule 100 mg  100 mg Oral BID Alasha Mcguinness T, MD   100 mg at 03/30/20 0900  . gabapentin (NEURONTIN) capsule 100 mg  100 mg Oral TID Royann Wildasin T, MD   100 mg at 03/30/20 1250  . hydrOXYzine (ATARAX/VISTARIL) tablet 10 mg  10 mg Oral TID PRN [name], MD   10 mg at 03/26/20 1404  . influenza vac split quadrivalent PF (FLUARIX) injection 0.5 mL  0.5 mL Intramuscular Tomorrow-1000 Rally Ouch T, MD      . lamoTRIgine (LAMICTAL) tablet 100 mg  100 mg Oral QHS Calum Cormier T, MD   100 mg at 03/29/20 2113  . linagliptin (TRADJENTA) tablet 5 mg  5 mg Oral Daily Othel Dicostanzo T, MD   5 mg at 03/30/20 0900  . loratadine (CLARITIN) tablet 10 mg  10 mg Oral Daily Fenna Semel, [name], MD   10 mg at 03/30/20 0901  . LORazepam (ATIVAN) tablet 2 mg  2 mg Oral Q6H PRN Lilyth Lawyer, [name], MD   2 mg at 03/28/20 1038  . magnesium  hydroxide (MILK OF MAGNESIA) suspension 30 mL  30 mL Oral Daily PRN [name], MD   30 mL at 03/23/20 1344  . metFORMIN (GLUCOPHAGE) tablet 850 mg  850 mg Oral BID WC Justyn Langham, [name], MD   850 mg at 03/30/20 0901  . metoprolol succinate (TOPROL-XL) 24 hr tablet 50 mg  50 mg Oral Daily Farheen Pfahler  T, MD   50 mg at 03/30/20 0905  . oxybutynin (DITROPAN) tablet 10 mg  10 mg Oral BID Laylee Schooley, [name], MD   10 mg at 03/30/20 0902  . pantoprazole (PROTONIX) EC tablet 40 mg  40 mg Oral Daily Tyresse Jayson, [name], MD   40 mg at 03/30/20 0901  . prazosin (MINIPRESS) capsule 1 mg  1 mg Oral QHS Jacquis Paxton, [name], MD   1 mg at 03/27/20 2156  . QUEtiapine (SEROQUEL) tablet 100 mg  100 mg Oral QID Lancer Thurner T, MD   100 mg at 03/30/20 1250  . QUEtiapine (SEROQUEL) tablet 400 mg  400 mg Oral QHS [name], NP   400 mg at 03/29/20 2113  . sertraline (ZOLOFT) tablet 100 mg  100 mg Oral BID Dayonna Selbe, [name], MD   100 mg at 03/30/20 0901  . simethicone (MYLICON) chewable tablet 80 mg  80 mg Oral TID Bona Hubbard, [name], MD   80 mg at 03/30/20 1250  . simvastatin (ZOCOR) tablet 20 mg  20 mg Oral q1800 Keyion Knack, [name], MD   20 mg at 03/29/20 1700  . tamsulosin (FLOMAX) capsule 0.8 mg  0.8 mg Oral QPC supper Aeden Matranga T, MD   0.8 mg at 03/29/20 1700    Lab Results: No results found for this or any previous visit (from the past 48 hour(s)).  Blood Alcohol level:  Lab Results  Component Value Date   ETH <10 01/22/2020   ETH <10 93/26/7124    Metabolic Disorder Labs: Lab Results  Component Value Date   HGBA1C 6.6 (H) 02/20/2020   MPG 142.72 02/20/2020   No results found for: PROLACTIN Lab Results  Component Value Date   CHOL 153 04/10/2015   TRIG 166 (H) 04/10/2015   HDL 50 04/10/2015   CHOLHDL 3.1 04/10/2015   VLDL 33 04/10/2015   LDLCALC 70 04/10/2015    Physical Findings: AIMS:  , ,  ,  ,    CIWA:    COWS:     Musculoskeletal: Strength & Muscle Tone: within normal limits Gait  & Station: normal Patient leans: N/A  Psychiatric Specialty Exam: Physical Exam Vitals and nursing note reviewed.  Constitutional:      Appearance: He is well-developed.  HENT:     Head: Normocephalic and atraumatic.  Eyes:     Conjunctiva/sclera: Conjunctivae normal.     Pupils: Pupils are equal, round, and reactive to light.  Cardiovascular:     Heart sounds: Normal heart sounds.  Pulmonary:     Effort: Pulmonary effort is normal.  Abdominal:     Palpations: Abdomen is soft.  Musculoskeletal:        General: Normal range of motion.     Cervical back: Normal range of motion.  Skin:    General: Skin is warm and dry.  Neurological:     General: No focal deficit present.     Mental Status: He is alert.  Psychiatric:        Mood and Affect: Mood is anxious.        Behavior: Behavior is agitated. Behavior is not aggressive.     Review of Systems  Constitutional: Negative.   HENT: Negative.   Eyes: Negative.   Respiratory: Negative.   Cardiovascular: Negative.   Gastrointestinal: Negative.   Musculoskeletal: Negative.   Skin: Negative.   Neurological: Negative.   Psychiatric/Behavioral: Negative.     Blood pressure 117/65, pulse 68, temperature 98.3 F (36.8 C), temperature source Oral,  resp. rate 18, height 5\' 8"  (1.727 m), weight 104.3 kg, SpO2 96 %.Body mass index is 34.96 kg/m.  General Appearance: Casual  Eye Contact:  Good  Speech:  Clear and Coherent  Volume:  Normal  Mood:  Euthymic  Affect:  Appropriate  Thought Process:  Coherent  Orientation:  Full (Time, Place, and Person)  Thought Content:  Logical  Suicidal Thoughts:  No  Homicidal Thoughts:  No  Memory:  Immediate;   Fair Recent;   Fair Remote;   Fair  Judgement:  Fair  Insight:  Fair  Psychomotor Activity:  Normal  Concentration:  Concentration: Fair  Recall:  [employer] of Knowledge:  Fair  Language:  Fair  Akathisia:  No  Handed:  Right  AIMS (if indicated):     Assets:  Desire for  Improvement  ADL's:  Intact  Cognition:  Impaired,  Mild  Sleep:  Number of Hours: 5     Treatment Plan Summary: Plan No change to overall treatment plan or specific acute medical plan.  Supportive therapy and encouragement.  Alethia Berthold, MD 03/30/2020, 4:28 PM

## 2020-03-30 NOTE — BHH Group Notes (Signed)
[name] Group Notes:  (Nursing/MHT/Case Management/Adjunct)  Date:  03/30/2020  Time:  9:40 AM  Type of Therapy:  Community Meeting  Participation Level:  Active  Participation Quality:  Appropriate, Attentive and Sharing  Affect:  Appropriate  Cognitive:  Alert and Appropriate  Insight:  Appropriate  Engagement in Group:  Engaged  Modes of Intervention:  Discussion, Education and Support  Summary of Progress/Problems:  Alexander Beard [facility] 03/30/2020, 9:40 AM

## 2020-03-30 NOTE — BHH Group Notes (Signed)
[facility] Group Notes:  (Nursing/MHT/Case Management/Adjunct)  Date:  03/30/2020  Time:  5:16 PM  Type of Therapy:  Psychoeducational Skills  Participation Level:  Active  Participation Quality:  Attentive  Affect:  Appropriate  Cognitive:  Oriented  Insight:  Improving  Engagement in Group:  Engaged  Modes of Intervention:  Discussion  Summary of Progress/Problems:  Judeth Porch 03/30/2020, 5:16 PM

## 2020-03-30 NOTE — Plan of Care (Signed)
Patient presents at his baseline  Problem: Education: Goal: Emotional status will improve Outcome: Not Progressing Goal: Mental status will improve Outcome: Not Progressing   

## 2020-03-30 NOTE — Progress Notes (Signed)
Patient presents withcalm and cooperative affecct.Patient denies SI/HI/AVH. Patient compliant with medication administration per MD orders.Patient given education, support, and encouragement to be active in his treatment plan. Patient being monitored Q 15 minutes for safety per unit protocol. Pt remains safe on the unit.

## 2020-03-30 NOTE — BHH Group Notes (Signed)
  BHH/BMU LCSW Group Therapy Note  Date/Time:  03/30/2020 1:15PM  Type of Therapy and Topic:  Group Therapy:  Feelings About Hospitalization  Participation Level:  Active   Description of Group This process group involved patients discussing their feelings related to being hospitalized, as well as the benefits they see to being in the hospital.  These feelings and benefits were itemized.  The group then brainstormed specific ways in which they could seek those same benefits when they discharge and return home.  Therapeutic Goals Patient will identify and describe positive and negative feelings related to hospitalization Patient will verbalize benefits of hospitalization to themselves personally Patients will brainstorm together ways they can obtain similar benefits in the outpatient setting, identify barriers to wellness and possible solutions  Summary of Patient Progress:  The patient actively engaged in introductory check-in, sharing his name and reported to be "doing well today". Pt expressed his primary feelings about being hospitalized are "terrible", explaining he is grateful for all the support provided by the doctors, nurses, team members and other patients, however expressed feeling as though he wanted to be in his own place and have freedom to do as he wishes. Pt openly noted positives and benefits of being hospitalized to be having all his needs met, having made friends, and feeling as though the doctor stabilized his medication. Pt expressed wanting to get out due to his brother needing support. Pt proved to engage in continued discussion surrounding how to secure supports in a outpatient setting to include medication, peer support, and other needs. Pt proved receptive to alternate group members input and feedback from [place].  Therapeutic Modalities Cognitive Behavioral Therapy Motivational Interviewing    Blane Ohara, [place] 03/30/2020  2:33 PM

## 2020-03-30 NOTE — Plan of Care (Signed)
D- Patient alert and oriented. Patient presents in a pleasant mood on assessment stating that he slept good last night and had no complaints to voice to this Probation officer. Patient denies SI, HI, AVH, and pain at this time. Patient also denies any signs/symptoms of depression/anxiety, he's just been preoccupied with trying to reach his niece for her to bring him some sketch pads. Patient's goal for today is "discharge, being ready to obtain my freedom, rest, keep busy, stay calm, enjoy myself, and artwork", in which he will "stay under control, stay calm, rest, be active, be independent, and take breaks to work on stress", in order to achieve his goal.  A- Scheduled medications administered to patient, per MD orders. Support and encouragement provided.  Routine safety checks conducted every 15 minutes.  Patient informed to notify staff with problems or concerns.  R- No adverse drug reactions noted. Patient contracts for safety at this time. Patient compliant with medications and treatment plan. Patient receptive, calm, and cooperative. Patient interacts well with others on the unit.  Patient remains safe at this time.   Problem: Education: Goal: Knowledge of [facility] Education information/materials will improve Outcome: Progressing Goal: Emotional status will improve Outcome: Progressing Goal: Mental status will improve Outcome: Progressing Goal: Verbalization of understanding the information provided will improve Outcome: Progressing   Problem: Activity: Goal: Interest or engagement in activities will improve Outcome: Progressing Goal: Sleeping patterns will improve Outcome: Progressing   Problem: Coping: Goal: Ability to verbalize frustrations and anger appropriately will improve Outcome: Progressing Goal: Ability to demonstrate self-control will improve Outcome: Progressing   Problem: Health Behavior/Discharge Planning: Goal: Identification of resources available to assist in  meeting health care needs will improve Outcome: Progressing Goal: Compliance with treatment plan for underlying cause of condition will improve Outcome: Progressing   Problem: Physical Regulation: Goal: Ability to maintain clinical measurements within normal limits will improve Outcome: Progressing   Problem: Safety: Goal: Periods of time without injury will increase Outcome: Progressing   Problem: Education: Goal: Ability to make informed decisions regarding treatment will improve Outcome: Progressing   Problem: Coping: Goal: Coping ability will improve Outcome: Progressing   Problem: Health Behavior/Discharge Planning: Goal: Identification of resources available to assist in meeting health care needs will improve Outcome: Progressing   Problem: Medication: Goal: Compliance with prescribed medication regimen will improve Outcome: Progressing   Problem: Self-Concept: Goal: Ability to disclose and discuss suicidal ideas will improve Outcome: Progressing Goal: Will verbalize positive feelings about self Outcome: Progressing

## 2020-03-30 NOTE — Plan of Care (Signed)
  Problem: Education: Goal: Knowledge of [facility] Education information/materials will improve Outcome: Progressing Goal: Emotional status will improve Outcome: Progressing Goal: Mental status will improve Outcome: Progressing Goal: Verbalization of understanding the information provided will improve Outcome: Progressing   Problem: Activity: Goal: Interest or engagement in activities will improve Outcome: Progressing Goal: Sleeping patterns will improve Outcome: Progressing   

## 2020-03-30 NOTE — Progress Notes (Signed)
Recreation Therapy Notes  Date: 03/30/2020  Time: 9:30 am   Location: Craft room  Behavioral response: Appropriate  Intervention Topic: Self-esteem   Discussion/Intervention:  Group content today was focused on self-esteem. Patient defined self-esteem and where it comes form. The group described reasons self-esteem is important. Individuals stated things that impact self-esteem and positive ways to improve self-esteem. The group participated in the intervention "Collage of Me" where patients were able to create a collage of positive things that makes them who they are.  Clinical Observations/Feedback: Patient came to group and identified his qualities as  fun, loving, talented and smart, helpful and thoughtful. He stated that self-esteem motivates him to keep trying and refuse to give up. Individual was social with peers and staff while participating in the intervention.  Demetre Monaco LRT/CTRS          Dajai Wahlert 03/30/2020 12:05 PM

## 2020-03-31 MED ORDER — ONDANSETRON HCL 4 MG PO TABS
4.0000 mg | ORAL_TABLET | Freq: Once | ORAL | Status: AC
  Administered 2020-03-31: 4 mg via ORAL
  Filled 2020-03-31: qty 1

## 2020-03-31 MED ORDER — ONDANSETRON HCL 4 MG/5ML PO SOLN
4.0000 mg | Freq: Once | ORAL | Status: DC
  Filled 2020-03-31: qty 5

## 2020-03-31 NOTE — BHH Counselor (Signed)
CSW followed up with Levie Heritage, [phone].  She reports that she does not have a bed availble and neither does her son.   Assunta Curtis, MSW, LCSW 03/31/2020 4:30 PM

## 2020-03-31 NOTE — Progress Notes (Signed)
Recreation Therapy Notes  Date: 03/31/2020  Time: 9:30 am   Location: Craft room  Behavioral response: Appropriate  Intervention Topic: Happiness    Discussion/Intervention:  Group content today was focused on Happiness. The group defined happiness and described where happiness comes from. Individuals identified what makes them happy and how they go about making others happy. Patients expressed things that stop them from being happy and ways they can improve their happiness. The group stated reasons why it is important to be happy. The group participated in the intervention "My Happiness", where they had a chance to identify and express things that make them happy.  Clinical Observations/Feedback: Patient came to group and was focused on what peers and staff had to say about happiness. He explained that being at his brothers house makes him happy. Participant expressed that getting a new home would make him happy and satisfied with life.  Individual was social with peers and staff while participating in the intervention.  Silvanna Ohmer LRT/CTRS             Lachlan Pelto 03/31/2020 11:37 AM

## 2020-03-31 NOTE — Progress Notes (Signed)
Pt is alert and oriented to person, place, time, and situation. Pt is calm, cooperative, pleasant, reports anxiety and depression rates them a 4/10 and 6/10 respectively both on a 0-10 scale, 10 being worst. Pt reports his anxiety and depression are triggered when he thinks about his future placement and housing issues, not knowing where he will be placed to live upon discharge. Pt reports he best coping skill is to draw and color. Pt reports that his brother might be dropping off his drawing paper pads [place] today. Pt is medication complaint, spends time drawing often in the dayroom, affect is anxious. Pt denies suicidal and homicidal ideation, denies hallucinations. Will continue to monitor pt per Q15 minute face checks and monitor for safety and progress.

## 2020-03-31 NOTE — BHH Counselor (Signed)
CSW called Event organiser with [facility], [phone].  CSW left HIPAA compliant voicemail.    CSW called Claiborne Billings at [facility], [phone], fax [phone].  He reports that patient's FL2 has not been reviewed, however, he will look for it today.    Assunta Curtis, MSW, LCSW 03/31/2020 1:16 PM

## 2020-03-31 NOTE — Tx Team (Signed)
Interdisciplinary Treatment and Diagnostic Plan Update  03/31/2020 Time of Session: 9:00 AM Alexander Beard MRN: [id number]  Principal Diagnosis: Schizoaffective disorder, bipolar type ([place])  Secondary Diagnoses: Principal Problem:   Schizoaffective disorder, bipolar type ([place]) Active Problems:   Active autistic disorder   Diabetes ([place])   Hypertension   Prostate hypertrophy   Intellectual disability   At risk for elopement   Current Medications:  Current Facility-Administered Medications  Medication Dose Route Frequency Provider Last Rate Last Admin  . acetaminophen (TYLENOL) tablet 650 mg  650 mg Oral Q6H PRN [name], MD      . alum & mag hydroxide-simeth (MAALOX/MYLANTA) 200-200-20 MG/5ML suspension 30 mL  30 mL Oral Q4H PRN [name], MD      . aspirin EC tablet 81 mg  81 mg Oral Daily [name], [name], MD   81 mg at 03/31/20 0807  . clonazePAM (KLONOPIN) tablet 1 mg  1 mg Oral QID [name], [name], MD   1 mg at 03/31/20 1226  . docusate sodium (COLACE) capsule 100 mg  100 mg Oral BID [name], [name], MD   100 mg at 03/31/20 0807  . gabapentin (NEURONTIN) capsule 100 mg  100 mg Oral TID [name], [name], MD   100 mg at 03/31/20 1225  . hydrOXYzine (ATARAX/VISTARIL) tablet 10 mg  10 mg Oral TID PRN [name], MD   10 mg at 03/30/20 2107  . influenza vac split quadrivalent PF (FLUARIX) injection 0.5 mL  0.5 mL Intramuscular Tomorrow-1000 [name], [name], MD      . lamoTRIgine (LAMICTAL) tablet 100 mg  100 mg Oral QHS [name], [name], MD   100 mg at 03/30/20 2108  . linagliptin (TRADJENTA) tablet 5 mg  5 mg Oral Daily [name], [name], MD   5 mg at 03/31/20 0807  . loratadine (CLARITIN) tablet 10 mg  10 mg Oral Daily [name], [name], MD   10 mg at 03/31/20 0807  . LORazepam (ATIVAN) tablet 2 mg  2 mg Oral Q6H PRN [name], [name], MD   2 mg at 03/28/20 1038  . magnesium hydroxide (MILK OF MAGNESIA) suspension 30 mL  30 mL Oral Daily PRN [name], MD   30 mL  at 03/23/20 1344  . metFORMIN (GLUCOPHAGE) tablet 850 mg  850 mg Oral BID WC [name], [name], MD   850 mg at 03/31/20 0806  . metoprolol succinate (TOPROL-XL) 24 hr tablet 50 mg  50 mg Oral Daily [name], [name], MD   50 mg at 03/31/20 0807  . oxybutynin (DITROPAN) tablet 10 mg  10 mg Oral BID [name], [name], MD   10 mg at 03/31/20 0806  . pantoprazole (PROTONIX) EC tablet 40 mg  40 mg Oral Daily [name], [name], MD   40 mg at 03/31/20 1027  . prazosin (MINIPRESS) capsule 1 mg  1 mg Oral QHS [name], [name], MD   1 mg at 03/27/20 2156  . QUEtiapine (SEROQUEL) tablet 100 mg  100 mg Oral QID [name], [name], MD   100 mg at 03/31/20 1225  . QUEtiapine (SEROQUEL) tablet 400 mg  400 mg Oral QHS [name], NP   400 mg at 03/30/20 2107  . sertraline (ZOLOFT) tablet 100 mg  100 mg Oral BID [name], [name], MD   100 mg at 03/31/20 0807  . simethicone (MYLICON) chewable tablet 80 mg  80 mg Oral TID [name], [name], MD   80 mg at 03/31/20  1225  . simvastatin (ZOCOR) tablet 20 mg  20 mg Oral q1800 [name], [name], MD   20 mg at 03/30/20 1717  . tamsulosin (FLOMAX) capsule 0.8 mg  0.8 mg Oral QPC supper [name], [name], MD   0.8 mg at 03/30/20 1716   PTA Medications: Medications Prior to Admission  Medication Sig Dispense Refill Last Dose  . acetaminophen (TYLENOL) 650 MG CR tablet Take 650 mg by mouth every 12 (twelve) hours as needed for pain.     Marland Kitchen aspirin EC 81 MG tablet Take 81 mg by mouth daily.     . Calcium Carbonate-Vitamin D3 (CALCIUM 600-D) 600-400 MG-UNIT TABS Take 1 tablet by mouth 2 (two) times daily.     . Cholecalciferol (VITAMIN D-1000 MAX ST) 1000 UNITS tablet Take 1,000 Units by mouth daily.      Marland Kitchen docusate sodium (COLACE) 100 MG capsule Take 100 mg by mouth daily.      . furosemide (LASIX) 20 MG tablet Take 20 mg by mouth daily.      Marland Kitchen gabapentin (NEURONTIN) 100 MG capsule Take 1 capsule (100 mg total) by mouth 3 (three) times daily. 90 capsule 10   . Insulin Detemir  (LEVEMIR FLEXTOUCH) 100 UNIT/ML Pen Inject 15 Units into the skin daily at 10 pm.     . lamoTRIgine (LAMICTAL) 100 MG tablet Take 1 tablet (100 mg total) by mouth at bedtime. 30 tablet 10   . loratadine (CLARITIN) 10 MG tablet Take 1 tablet (10 mg total) by mouth daily. 30 tablet 5   . LORazepam (ATIVAN) 1 MG tablet Take 1 tablet (1 mg total) by mouth 3 (three) times daily. 90 tablet 5   . lurasidone (LATUDA) 40 MG TABS tablet Take 1 tablet (40 mg total) by mouth daily. with food 30 tablet 10   . metoprolol succinate (TOPROL-XL) 50 MG 24 hr tablet Take 50 mg by mouth daily.      . NON FORMULARY cpap device     . oxybutynin (DITROPAN) 5 MG tablet Take 5 mg by mouth daily.     . pantoprazole (PROTONIX) 40 MG tablet Take 40 mg by mouth daily.     . polyethylene glycol (MIRALAX / GLYCOLAX) packet Take 17 g by mouth daily.     . QUEtiapine (SEROQUEL) 100 MG tablet TAKE 1 TABLET  BY MOUTH THREE TIMES A DAY (Patient taking differently: Take 100 mg by mouth 3 (three) times daily. TAKE 1 TABLET  BY MOUTH THREE TIMES A DAY) 90 tablet 11   . QUEtiapine (SEROQUEL) 400 MG tablet Take 1 tablet (400 mg total) by mouth at bedtime. 30 tablet 10   . sertraline (ZOLOFT) 100 MG tablet Take 2 tablets (200 mg total) by mouth daily. (Patient taking differently: Take 200 mg by mouth daily. Take along with two 50 mg tablets (100 mg) for total 300 mg daily) 60 tablet 5   . sertraline (ZOLOFT) 50 MG tablet Take 2 tablets (100 mg total) by mouth daily. (Patient taking differently: Take 100 mg by mouth daily. Take along with two 100 mg tablets (200 mg) for total 300 mg daily) 60 tablet 5   . simethicone (MYLICON) 80 MG chewable tablet Chew 80-160 mg by mouth 2 (two) times daily as needed for flatulence.     . simvastatin (ZOCOR) 20 MG tablet Take 20 mg by mouth at bedtime.      . sitaGLIPtin (JANUVIA) 100 MG tablet Take 100 mg by mouth daily.     Marland Kitchen  solifenacin (VESICARE) 5 MG tablet Take 5 mg by mouth daily.     . Starch  (HEMORRHOIDAL RE) Place 1 application rectally 4 (four) times daily as needed (for itching).     . tamsulosin (FLOMAX) 0.4 MG CAPS capsule Take 0.4 mg by mouth daily.       Patient Stressors: Health problems Marital or family conflict Medication change or noncompliance Traumatic event  Patient Strengths: Motivation for treatment/growth Religious Affiliation  Treatment Modalities: Medication Management, Group therapy, Case management,  1 to 1 session with clinician, Psychoeducation, Recreational therapy.   Physician Treatment Plan for Primary Diagnosis: Schizoaffective disorder, bipolar type ([place]) Long Term Goal(s): Improvement in symptoms so as ready for discharge Improvement in symptoms so as ready for discharge   Short Term Goals: Ability to verbalize feelings will improve Ability to demonstrate self-control will improve Ability to maintain clinical measurements within normal limits will improve Compliance with prescribed medications will improve  Medication Management: Evaluate patient's response, side effects, and tolerance of medication regimen.  Therapeutic Interventions: 1 to 1 sessions, Unit Group sessions and Medication administration.  Evaluation of Outcomes: Progressing  Physician Treatment Plan for Secondary Diagnosis: Principal Problem:   Schizoaffective disorder, bipolar type ([place]) Active Problems:   Active autistic disorder   Diabetes ([place])   Hypertension   Prostate hypertrophy   Intellectual disability   At risk for elopement  Long Term Goal(s): Improvement in symptoms so as ready for discharge Improvement in symptoms so as ready for discharge   Short Term Goals: Ability to verbalize feelings will improve Ability to demonstrate self-control will improve Ability to maintain clinical measurements within normal limits will improve Compliance with prescribed medications will improve     Medication Management: Evaluate patient's response, side effects, and  tolerance of medication regimen.  Therapeutic Interventions: 1 to 1 sessions, Unit Group sessions and Medication administration.  Evaluation of Outcomes: Progressing   RN Treatment Plan for Primary Diagnosis: Schizoaffective disorder, bipolar type ([place]) Long Term Goal(s): Knowledge of disease and therapeutic regimen to maintain health will improve  Short Term Goals: Ability to demonstrate self-control, Ability to participate in decision making will improve, Ability to verbalize feelings will improve, Ability to disclose and discuss suicidal ideas, Ability to identify and develop effective coping behaviors will improve and Compliance with prescribed medications will improve  Medication Management: RN will administer medications as ordered by provider, will assess and evaluate patient's response and provide education to patient for prescribed medication. RN will report any adverse and/or side effects to prescribing provider.  Therapeutic Interventions: 1 on 1 counseling sessions, Psychoeducation, Medication administration, Evaluate responses to treatment, Monitor vital signs and CBGs as ordered, Perform/monitor CIWA, COWS, AIMS and Fall Risk screenings as ordered, Perform wound care treatments as ordered.  Evaluation of Outcomes: Progressing   LCSW Treatment Plan for Primary Diagnosis: Schizoaffective disorder, bipolar type ([place]) Long Term Goal(s): Safe transition to appropriate next level of care at discharge, Engage patient in therapeutic group addressing interpersonal concerns.  Short Term Goals: Engage patient in aftercare planning with referrals and resources, Increase social support, Increase ability to appropriately verbalize feelings, Increase emotional regulation and Increase skills for wellness and recovery  Therapeutic Interventions: Assess for all discharge needs, 1 to 1 time with Social worker, Explore available resources and support systems, Assess for adequacy in community  support network, Educate family and significant other(s) on suicide prevention, Complete Psychosocial Assessment, Interpersonal group therapy.  Evaluation of Outcomes: Progressing   Progress in Treatment: Attending groups: Yes. Participating in  groups: Yes. Taking medication as prescribed: Yes. Toleration medication: Yes. Family/Significant other contact made: Yes, individual(s) contacted:  [place], Brother  Patient understands diagnosis: Yes. Discussing patient identified problems/goals with staff: Yes. Medical problems stabilized or resolved: Yes. Denies suicidal/homicidal ideation: Yes. Issues/concerns per patient self-inventory: No. Other: N/a  New problem(s) identified: No, Describe:  None  New Short Term/Long Term Goal(s): Elimination of symptoms of psychosis, medication management for mood stabilization; elimination of SI thoughts; development of comprehensive mental wellness/sobriety plan.Update 03/01/20:No changes at this time. 03/07/20: Update, no changes at this time Update 03/12/2020:  No changes at this time. Update 03/17/2020:  No changes at this time. Update 03/21/20: No changes at this time. Update 03/26/20:  No changes at this time. Update 03/31/2020:  No changes at this time.    Patient Goals:  Patient stated he would like to go back to [place] and reconnect with his peer support. Patient also stated that he would like to increase his social support and help his brother.Update 03/07/20, no change Update 03/12/2020:  No changes at this time.  Update 03/17/2020:  No changes at this time. Update 03/21/20: No changes at this time. Update 03/26/20:  No changes at this time.  Update 03/31/2020:  No changes at this time.   Discharge Plan or Barriers: Patient has an interview with a group home on 03/09/20 at 2:00 PM. Update 03/12/2020:  CSW continues to assist the patient in developing a housing plan.   Update 03/17/2020:  CSW has assisted patient in completing an interview with a group home,  however, there has been no word on if the patient has been approved.  Patient continues to struggle with his anxiety and nerves.  Nurses report that he has increased in bed wetting.  Psychiatrist and nurses believe that this may be related to his increased anxiety about finding a home. Update 03/21/20: Patient is still nervous and anxious about finding a new placement. Patient stated he could not stay here too much longer and wanted his life back. Update 03/26/20: CSW continues to look for placement for pt.  Group Homes, [place] are being considered. Update 03/31/2020:  Patient continues to have interview with group homes, however, no success at this time in identifying a home that has accepted him.   Reason for Continuation of Hospitalization: Anxiety Depression Medication stabilization  Estimated Length of Stay: TBD  Attendees: Patient: 03/31/2020 1:06 PM  Physician: [name], MD  03/31/2020 1:06 PM  Nursing:  03/31/2020 1:06 PM  RN Care Manager: 03/31/2020 1:06 PM  Social Worker: Assunta Curtis, [place] 03/31/2020 1:06 PM  Recreational Therapist:  03/31/2020 1:06 PM  Other:  03/31/2020 1:06 PM  Other:  03/31/2020 1:06 PM  Other: 03/31/2020 1:06 PM    Scribe for Treatment Team: [name], LCSW 03/31/2020 1:06 PM

## 2020-03-31 NOTE — Progress Notes (Signed)
[facility] MD Progress Note  03/31/2020 3:31 PM Alexander Beard  MRN:  [id number] Subjective: Follow-up patient with schizoaffective disorder and developmental disability.  No new complaints.  Stays frustrated much of the time about still being in the hospital.  No aggression however, no violence no suicidal ideation. Principal Problem: Schizoaffective disorder, bipolar type ([place]) Diagnosis: Principal Problem:   Schizoaffective disorder, bipolar type ([place]) Active Problems:   Active autistic disorder   Diabetes ([place])   Hypertension   Prostate hypertrophy   Intellectual disability   At risk for elopement  Total Time spent with patient: 30 minutes  Past Psychiatric History: Past history of anxiety and depression and difficult behavior  Past Medical History:  Past Medical History:  Diagnosis Date  . Anxiety   . Anxiety disorder due to known physiological condition    HOSPITALIZED 10/18  . Arthritis   . Autistic disorder, residual state   . COPD (chronic obstructive pulmonary disease) ([place])   . Depression   . Developmental disorder   . Diabetes mellitus without complication ([place])   . Dyslipidemia   . Esophageal reflux   . HOH (hard of hearing)    MILDLY  . Hypertension   . Obesity   . Overactive bladder   . Palpitations    ANXIETY  . Schizophrenia, schizoaffective ([place])   . Sleep apnea   . Tremors of nervous system    HANDS DUE TO MEDICATIONS  . Urinary incontinence     Past Surgical History:  Procedure Laterality Date  . CATARACT EXTRACTION W/PHACO Left 11/14/2017   Procedure: CATARACT EXTRACTION PHACO AND INTRAOCULAR LENS PLACEMENT (IOC);  Surgeon: Birder Robson, MD;  Location: [facility] ORS;  Service: Ophthalmology;  Laterality: Left;  [place] 00:42 AP% 14.6 CDE 6.12 Fluid pack lot # [id number]  . COLONOSCOPY  01/28/2005  . COLONOSCOPY WITH PROPOFOL N/A 05/09/2016   Procedure: COLONOSCOPY WITH PROPOFOL;  Surgeon: Manya Silvas, MD;  Location: [facility] ENDOSCOPY;  Service: Endoscopy;   Laterality: N/A;  . ESOPHAGOGASTRODUODENOSCOPY N/A 05/09/2016   Procedure: ESOPHAGOGASTRODUODENOSCOPY (EGD);  Surgeon: Manya Silvas, MD;  Location[facility] ENDOSCOPY;  Service: Endoscopy;  Laterality: N/A;  . FRACTURE SURGERY     ORIF SHOULDER  . TONSILLECTOMY     Family History:  Family History  Problem Relation Age of Onset  . Hypertension Mother   . Stroke Father   . Heart Problems Father    Family Psychiatric  History: See previous Social History:  Social History   Substance and Sexual Activity  Alcohol Use No  . Alcohol/week: 0.0 standard drinks     Social History   Substance and Sexual Activity  Drug Use No    Social History   Socioeconomic History  . Marital status: Single    Spouse name: Not on file  . Number of children: Not on file  . Years of education: Not on file  . Highest education level: Not on file  Occupational History  . Not on file  Tobacco Use  . Smoking status: Never Smoker  . Smokeless tobacco: Never Used  Vaping Use  . Vaping Use: Never used  Substance and Sexual Activity  . Alcohol use: No    Alcohol/week: 0.0 standard drinks  . Drug use: No  . Sexual activity: Never  Other Topics Concern  . Not on file  Social History Narrative   The patient never finished high school but did get his GED. He works in the past as a Museum/gallery conservator. He has never been  married and has no children. He is currently in disability and [name] is his legal guardian. He has been living in group homes for many years.      No pending legal charges   Social Determinants of Health   Financial Resource Strain:   . Difficulty of Paying Living Expenses: Not on file  Food Insecurity:   . Worried About Charity fundraiser in the Last Year: Not on file  . Ran Out of Food in the Last Year: Not on file  Transportation Needs:   . Lack of Transportation (Medical): Not on file  . Lack of Transportation (Non-Medical): Not on file  Physical  Activity:   . Days of Exercise per Week: Not on file  . Minutes of Exercise per Session: Not on file  Stress:   . Feeling of Stress : Not on file  Social Connections:   . Frequency of Communication with Friends and Family: Not on file  . Frequency of Social Gatherings with Friends and Family: Not on file  . Attends Religious Services: Not on file  . Active Member of Clubs or Organizations: Not on file  . Attends Archivist Meetings: Not on file  . Marital Status: Not on file   Additional Social History:                         Sleep: Fair  Appetite:  Fair  Current Medications: Current Facility-Administered Medications  Medication Dose Route Frequency Provider Last Rate Last Admin  . acetaminophen (TYLENOL) tablet 650 mg  650 mg Oral Q6H PRN [name], MD      . alum & mag hydroxide-simeth (MAALOX/MYLANTA) 200-200-20 MG/5ML suspension 30 mL  30 mL Oral Q4H PRN [name], MD      . aspirin EC tablet 81 mg  81 mg Oral Daily Ayslin Kundert, [name], MD   81 mg at 03/31/20 0807  . clonazePAM (KLONOPIN) tablet 1 mg  1 mg Oral QID Arney Mayabb, [name], MD   1 mg at 03/31/20 1226  . docusate sodium (COLACE) capsule 100 mg  100 mg Oral BID Keziah Avis, [name], MD   100 mg at 03/31/20 0807  . gabapentin (NEURONTIN) capsule 100 mg  100 mg Oral TID Kaisey Huseby T, MD   100 mg at 03/31/20 1225  . hydrOXYzine (ATARAX/VISTARIL) tablet 10 mg  10 mg Oral TID PRN [name], MD   10 mg at 03/30/20 2107  . influenza vac split quadrivalent PF (FLUARIX) injection 0.5 mL  0.5 mL Intramuscular Tomorrow-1000 Malan Werk T, MD      . lamoTRIgine (LAMICTAL) tablet 100 mg  100 mg Oral QHS Sandrine Bloodsworth T, MD   100 mg at 03/30/20 2108  . linagliptin (TRADJENTA) tablet 5 mg  5 mg Oral Daily Alette Kataoka, [name], MD   5 mg at 03/31/20 0807  . loratadine (CLARITIN) tablet 10 mg  10 mg Oral Daily Gamal Todisco, [name], MD   10 mg at 03/31/20 0807  . LORazepam (ATIVAN) tablet 2 mg  2 mg Oral Q6H PRN  Taevion Sikora, [name], MD   2 mg at 03/28/20 1038  . magnesium hydroxide (MILK OF MAGNESIA) suspension 30 mL  30 mL Oral Daily PRN [name], MD   30 mL at 03/23/20 1344  . metFORMIN (GLUCOPHAGE) tablet 850 mg  850 mg Oral BID WC Jaques Mineer, [name], MD   850 mg at 03/31/20 0806  . metoprolol succinate (TOPROL-XL)  24 hr tablet 50 mg  50 mg Oral Daily Vanden Fawaz, [name], MD   50 mg at 03/31/20 0807  . oxybutynin (DITROPAN) tablet 10 mg  10 mg Oral BID Malvern Kadlec, [name], MD   10 mg at 03/31/20 0806  . pantoprazole (PROTONIX) EC tablet 40 mg  40 mg Oral Daily Jadon Harbaugh, [name], MD   40 mg at 03/31/20 6213  . prazosin (MINIPRESS) capsule 1 mg  1 mg Oral QHS Ayad Nieman, [name], MD   1 mg at 03/27/20 2156  . QUEtiapine (SEROQUEL) tablet 100 mg  100 mg Oral QID Jen Benedict T, MD   100 mg at 03/31/20 1225  . QUEtiapine (SEROQUEL) tablet 400 mg  400 mg Oral QHS [name], NP   400 mg at 03/30/20 2107  . sertraline (ZOLOFT) tablet 100 mg  100 mg Oral BID Kinzy Weyers, [name], MD   100 mg at 03/31/20 0807  . simethicone (MYLICON) chewable tablet 80 mg  80 mg Oral TID Jovahn Breit, [name], MD   80 mg at 03/31/20 1225  . simvastatin (ZOCOR) tablet 20 mg  20 mg Oral q1800 Quadasia Newsham, [name], MD   20 mg at 03/30/20 1717  . tamsulosin (FLOMAX) capsule 0.8 mg  0.8 mg Oral QPC supper Haig Gerardo T, MD   0.8 mg at 03/30/20 1716    Lab Results: No results found for this or any previous visit (from the past 48 hour(s)).  Blood Alcohol level:  Lab Results  Component Value Date   ETH <10 01/22/2020   ETH <10 08/65/7846    Metabolic Disorder Labs: Lab Results  Component Value Date   HGBA1C 6.6 (H) 02/20/2020   MPG 142.72 02/20/2020   No results found for: PROLACTIN Lab Results  Component Value Date   CHOL 153 04/10/2015   TRIG 166 (H) 04/10/2015   HDL 50 04/10/2015   CHOLHDL 3.1 04/10/2015   VLDL 33 04/10/2015   LDLCALC 70 04/10/2015    Physical Findings: AIMS:  , ,  ,  ,    CIWA:    COWS:      Musculoskeletal: Strength & Muscle Tone: within normal limits Gait & Station: normal Patient leans: N/A  Psychiatric Specialty Exam: Physical Exam Vitals and nursing note reviewed.  Constitutional:      Appearance: He is well-developed.  HENT:     Head: Normocephalic and atraumatic.  Eyes:     Conjunctiva/sclera: Conjunctivae normal.     Pupils: Pupils are equal, round, and reactive to light.  Cardiovascular:     Heart sounds: Normal heart sounds.  Pulmonary:     Effort: Pulmonary effort is normal.  Abdominal:     Palpations: Abdomen is soft.  Musculoskeletal:        General: Normal range of motion.     Cervical back: Normal range of motion.  Skin:    General: Skin is warm and dry.  Neurological:     General: No focal deficit present.     Mental Status: He is alert.  Psychiatric:        Attention and Perception: He is inattentive.        Mood and Affect: Mood is anxious.        Speech: Speech is tangential.        Behavior: Behavior is agitated. Behavior is not aggressive.     Review of Systems  Constitutional: Negative.   HENT: Negative.   Eyes: Negative.   Respiratory: Negative.   Cardiovascular: Negative.  Gastrointestinal: Negative.   Musculoskeletal: Negative.   Skin: Negative.   Neurological: Negative.   Psychiatric/Behavioral: The patient is nervous/anxious.     Blood pressure 110/76, pulse (!) 58, temperature 98.8 F (37.1 C), temperature source Oral, resp. rate 18, height 5\' 8"  (1.727 m), weight 104.3 kg, SpO2 97 %.Body mass index is 34.96 kg/m.  General Appearance: Casual  Eye Contact:  Good  Speech:  Clear and Coherent  Volume:  Normal  Mood:  Euthymic  Affect:  Congruent  Thought Process:  Goal Directed  Orientation:  Full (Time, Place, and Person)  Thought Content:  Logical  Suicidal Thoughts:  No  Homicidal Thoughts:  No  Memory:  Immediate;   Fair Recent;   Fair Remote;   Fair  Judgement:  Fair  Insight:  Fair  Psychomotor  Activity:  Decreased  Concentration:  Concentration: Poor  Recall:  [place] of Knowledge:  Poor  Language:  Fair  Akathisia:  No  Handed:  Right  AIMS (if indicated):     Assets:  Desire for Improvement Resilience  ADL's:  Impaired  Cognition:  Impaired,  Mild  Sleep:  Number of Hours: 6.75     Treatment Plan Summary: Daily contact with patient to assess and evaluate symptoms and progress in treatment, Medication management and Plan No change to current treatment.  Stable on current medicine.  Encourage his enjoyment of his usual daily activities.  Treatment team staying abreast of any improvements in the effort to find placement  Alethia Berthold, MD 03/31/2020, 3:31 PM

## 2020-03-31 NOTE — Progress Notes (Signed)
Patient vomited 3 times today and has it visible on his clothing. Patient states he is feeling weak with a upset stomach. Provider informed and stated to give PRN medications for nausea. Patient will continue to be monitored. Patients night medications held due to upset stomach, provider notified. Patient being monitored Q 15 minutes for safety per unit protocol. Pt remains safe on the unit.

## 2020-03-31 NOTE — Plan of Care (Signed)
Patient presents at his baseline   Problem: Education: Goal: Emotional status will improve Outcome: Progressing Goal: Mental status will improve Outcome: Progressing

## 2020-04-01 DIAGNOSIS — F250 Schizoaffective disorder, bipolar type: Secondary | ICD-10-CM | POA: Diagnosis not present

## 2020-04-01 NOTE — Plan of Care (Signed)
Patient presents at his baseline  Problem: Education: Goal: Emotional status will improve Outcome: Not Progressing Goal: Mental status will improve Outcome: Not Progressing   

## 2020-04-01 NOTE — Progress Notes (Signed)
[facility] MD Progress Note  04/01/2020 4:06 PM Alexander Beard  MRN:  [id number] Subjective: Patient seen chart reviewed.  Patient had several episodes of vomiting in the last 24 hours.  Various theories have been advanced about this but it does not seem like it was really connected to any specific ingestion.  During the interview with him today he started to look like he was going to throw up at the same time he was trembling and getting anxious.  When I got him to gradually calm down the nausea appeared to subside.  This supports my guess which is that most of it is a reflection of his anxiety and continued unhappiness about being on the psychiatric ward. Principal Problem: Schizoaffective disorder, bipolar type ([place]) Diagnosis: Principal Problem:   Schizoaffective disorder, bipolar type ([place]) Active Problems:   Active autistic disorder   Diabetes ([place])   Hypertension   Prostate hypertrophy   Intellectual disability   At risk for elopement  Total Time spent with patient: 30 minutes  Past Psychiatric History: Long history of developmental disability now with dementia and mental health problems  Past Medical History:  Past Medical History:  Diagnosis Date  . Anxiety   . Anxiety disorder due to known physiological condition    HOSPITALIZED 10/18  . Arthritis   . Autistic disorder, residual state   . COPD (chronic obstructive pulmonary disease) ([place])   . Depression   . Developmental disorder   . Diabetes mellitus without complication ([place])   . Dyslipidemia   . Esophageal reflux   . HOH (hard of hearing)    MILDLY  . Hypertension   . Obesity   . Overactive bladder   . Palpitations    ANXIETY  . Schizophrenia, schizoaffective ([place])   . Sleep apnea   . Tremors of nervous system    HANDS DUE TO MEDICATIONS  . Urinary incontinence     Past Surgical History:  Procedure Laterality Date  . CATARACT EXTRACTION W/PHACO Left 11/14/2017   Procedure: CATARACT EXTRACTION PHACO AND INTRAOCULAR  LENS PLACEMENT (IOC);  Surgeon: Birder Robson, MD;  Location: [facility] ORS;  Service: Ophthalmology;  Laterality: Left;  [place] 00:42 AP% 14.6 CDE 6.12 Fluid pack lot # [id number]  . COLONOSCOPY  01/28/2005  . COLONOSCOPY WITH PROPOFOL N/A 05/09/2016   Procedure: COLONOSCOPY WITH PROPOFOL;  Surgeon: Manya Silvas, MD;  Location: [facility] ENDOSCOPY;  Service: Endoscopy;  Laterality: N/A;  . ESOPHAGOGASTRODUODENOSCOPY N/A 05/09/2016   Procedure: ESOPHAGOGASTRODUODENOSCOPY (EGD);  Surgeon: Manya Silvas, MD;  Location: [facility] ENDOSCOPY;  Service: Endoscopy;  Laterality: N/A;  . FRACTURE SURGERY     ORIF SHOULDER  . TONSILLECTOMY     Family History:  Family History  Problem Relation Age of Onset  . Hypertension Mother   . Stroke Father   . Heart Problems Father    Family Psychiatric  History: See previous Social History:  Social History   Substance and Sexual Activity  Alcohol Use No  . Alcohol/week: 0.0 standard drinks     Social History   Substance and Sexual Activity  Drug Use No    Social History   Socioeconomic History  . Marital status: Single    Spouse name: Not on file  . Number of children: Not on file  . Years of education: Not on file  . Highest education level: Not on file  Occupational History  . Not on file  Tobacco Use  . Smoking status: Never Smoker  . Smokeless tobacco: Never Used  Vaping Use  . Vaping Use: Never used  Substance and Sexual Activity  . Alcohol use: No    Alcohol/week: 0.0 standard drinks  . Drug use: No  . Sexual activity: Never  Other Topics Concern  . Not on file  Social History Narrative   The patient never finished high school but did get his GED. 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 He is currently in disability and [name] is his legal guardian. He has been living in group homes for many years.      No pending legal charges   Social Determinants of Health    Financial Resource Strain:   . Difficulty of Paying Living Expenses: Not on file  Food Insecurity:   . Worried About Charity fundraiser in the Last Year: Not on file  . Ran Out of Food in the Last Year: Not on file  Transportation Needs:   . Lack of Transportation (Medical): Not on file  . Lack of Transportation (Non-Medical): Not on file  Physical Activity:   . Days of Exercise per Week: Not on file  . Minutes of Exercise per Session: Not on file  Stress:   . Feeling of Stress : Not on file  Social Connections:   . Frequency of Communication with Friends and Family: Not on file  . Frequency of Social Gatherings with Friends and Family: Not on file  . Attends Religious Services: Not on file  . Active Member of Clubs or Organizations: Not on file  . Attends Archivist Meetings: Not on file  . Marital Status: Not on file   Additional Social History:                         Sleep: Fair  Appetite:  Fair  Current Medications: Current Facility-Administered Medications  Medication Dose Route Frequency Provider Last Rate Last Admin  . acetaminophen (TYLENOL) tablet 650 mg  650 mg Oral Q6H PRN [name], MD      . alum & mag hydroxide-simeth (MAALOX/MYLANTA) 200-200-20 MG/5ML suspension 30 mL  30 mL Oral Q4H PRN [name], MD      . aspirin EC tablet 81 mg  81 mg Oral Daily Roisin Mones, [name], MD   81 mg at 04/01/20 0850  . clonazePAM (KLONOPIN) tablet 1 mg  1 mg Oral QID Cabrina Shiroma, [name], MD   1 mg at 04/01/20 1221  . docusate sodium (COLACE) capsule 100 mg  100 mg Oral BID Ary Lavine, [name], MD   100 mg at 04/01/20 0850  . gabapentin (NEURONTIN) capsule 100 mg  100 mg Oral TID Jorden Mahl T, MD   100 mg at 04/01/20 1221  . hydrOXYzine (ATARAX/VISTARIL) tablet 10 mg  10 mg Oral TID PRN [name], MD   10 mg at 03/30/20 2107  . influenza vac split quadrivalent PF (FLUARIX) injection 0.5 mL  0.5 mL Intramuscular Tomorrow-1000 Cornelis Kluver T, MD       . lamoTRIgine (LAMICTAL) tablet 100 mg  100 mg Oral QHS Javon Hupfer T, MD   100 mg at 03/30/20 2108  . linagliptin (TRADJENTA) tablet 5 mg  5 mg Oral Daily Ludy Messamore, [name], MD   5 mg at 04/01/20 0850  . loratadine (CLARITIN) tablet 10 mg  10 mg Oral Daily Amonda Brillhart, [name], MD   10 mg at 04/01/20 0850  . LORazepam (ATIVAN)  tablet 2 mg  2 mg Oral Q6H PRN Shatima Zalar, [name], MD   2 mg at 03/28/20 1038  . magnesium hydroxide (MILK OF MAGNESIA) suspension 30 mL  30 mL Oral Daily PRN [name], MD   30 mL at 03/23/20 1344  . metFORMIN (GLUCOPHAGE) tablet 850 mg  850 mg Oral BID WC Jovonte Commins, [name], MD   850 mg at 04/01/20 0850  . metoprolol succinate (TOPROL-XL) 24 hr tablet 50 mg  50 mg Oral Daily Yohann Curl, [name], MD   50 mg at 04/01/20 0850  . oxybutynin (DITROPAN) tablet 10 mg  10 mg Oral BID Derriona Branscom, [name], MD   10 mg at 04/01/20 0850  . pantoprazole (PROTONIX) EC tablet 40 mg  40 mg Oral Daily Aili Casillas, [name], MD   40 mg at 04/01/20 0851  . prazosin (MINIPRESS) capsule 1 mg  1 mg Oral QHS Dirck Butch, [name], MD   1 mg at 03/27/20 2156  . QUEtiapine (SEROQUEL) tablet 100 mg  100 mg Oral QID Dallana Mavity, [name], MD   100 mg at 04/01/20 1221  . QUEtiapine (SEROQUEL) tablet 400 mg  400 mg Oral QHS [name], NP   400 mg at 03/30/20 2107  . sertraline (ZOLOFT) tablet 100 mg  100 mg Oral BID Dajai Wahlert, [name], MD   100 mg at 04/01/20 0850  . simethicone (MYLICON) chewable tablet 80 mg  80 mg Oral TID Mujahid Jalomo, [name], MD   80 mg at 04/01/20 1221  . simvastatin (ZOCOR) tablet 20 mg  20 mg Oral q1800 Nayra Coury, [name], MD   20 mg at 03/31/20 1716  . tamsulosin (FLOMAX) capsule 0.8 mg  0.8 mg Oral QPC supper Chasen Mendell, [name], MD   0.8 mg at 03/31/20 1715    Lab Results: No results found for this or any previous visit (from the past 48 hour(s)).  Blood Alcohol level:  Lab Results  Component Value Date   ETH <10 01/22/2020   ETH <10 27/51/7001    Metabolic Disorder Labs: Lab Results   Component Value Date   HGBA1C 6.6 (H) 02/20/2020   MPG 142.72 02/20/2020   No results found for: PROLACTIN Lab Results  Component Value Date   CHOL 153 04/10/2015   TRIG 166 (H) 04/10/2015   HDL 50 04/10/2015   CHOLHDL 3.1 04/10/2015   VLDL 33 04/10/2015   LDLCALC 70 04/10/2015    Physical Findings: AIMS:  , ,  ,  ,    CIWA:    COWS:     Musculoskeletal: Strength & Muscle Tone: within normal limits Gait & Station: normal Patient leans: N/A  Psychiatric Specialty Exam: Physical Exam Vitals and nursing note reviewed.  Constitutional:      Appearance: He is well-developed.  HENT:     Head: Normocephalic and atraumatic.  Eyes:     Conjunctiva/sclera: Conjunctivae normal.     Pupils: Pupils are equal, round, and reactive to light.  Cardiovascular:     Heart sounds: Normal heart sounds.  Pulmonary:     Effort: Pulmonary effort is normal.  Abdominal:     Palpations: Abdomen is soft.  Musculoskeletal:        General: Normal range of motion.     Cervical back: Normal range of motion.  Skin:    General: Skin is warm and dry.  Neurological:     General: No focal deficit present.     Mental Status: He is alert.  Psychiatric:  Mood and Affect: Mood is anxious.     Review of Systems  Constitutional: Negative.   HENT: Negative.   Eyes: Negative.   Respiratory: Negative.   Cardiovascular: Negative.   Gastrointestinal: Positive for nausea.  Musculoskeletal: Negative.   Skin: Negative.   Neurological: Negative.   Psychiatric/Behavioral: The patient is nervous/anxious.     Blood pressure 111/75, pulse 82, temperature 97.7 F (36.5 C), temperature source Oral, resp. rate 17, height 5\' 8"  (1.727 m), weight 104.3 kg, SpO2 93 %.Body mass index is 34.96 kg/m.  General Appearance: Casual  Eye Contact:  Good  Speech:  Clear and Coherent  Volume:  Normal  Mood:  Anxious and Dysphoric  Affect:  Congruent  Thought Process:  Coherent  Orientation:  Full (Time,  Place, and Person)  Thought Content:  Rumination and Tangential  Suicidal Thoughts:  No  Homicidal Thoughts:  No  Memory:  Immediate;   Fair Recent;   Fair Remote;   Fair  Judgement:  Fair  Insight:  Fair  Psychomotor Activity:  Normal  Concentration:  Concentration: Poor  Recall:  [place] of Knowledge:  Poor  Language:  Fair  Akathisia:  No  Handed:  Right  AIMS (if indicated):     Assets:  Desire for Improvement  ADL's:  Impaired  Cognition:  Impaired,  Mild  Sleep:  Number of Hours: 5.75     Treatment Plan Summary: Daily contact with patient to assess and evaluate symptoms and progress in treatment, Medication management and Plan He does not appear from what I can tell to have any more systemic illness that requires any intervention with the anxiety induced vomiting.  He had a nice time today talking with the student nurses.  [place] is always happy when he gets to talk to someone who is willing to talk back to him nicely.  No change to medicine.  Alethia Berthold, MD 04/01/2020, 4:06 PM

## 2020-04-01 NOTE — Progress Notes (Signed)
Patient presents withcalm and cooperative affecct.Patient denies SI/HI/AVH. Patient compliant with medication administration per MD orders.Patient given education, support, and encouragement to be active in his treatment plan. Patient being monitored Q 15 minutes for safety per unit protocol. Pt remains safe on the unit.

## 2020-04-01 NOTE — Progress Notes (Signed)
Patient stated he was feeling better this morning but still feels a little weak

## 2020-04-01 NOTE — Progress Notes (Signed)
Recreation Therapy Notes  Date: 04/01/2020  Time: 9:30 am   Location: Craft room  Behavioral response: Appropriate  Intervention Topic: Coping Skills  Discussion/Intervention:  Group content on today was focused on coping skills. The group defined what coping skills are and when they normally use coping skills. Individuals described how they normally cope with thing and the coping skills they normally use. Patients expressed why it is important to cope with things and how not coping with things can affect you. The group participated in the intervention "My coping box" and made coping boxes while adding coping skills they could use in the future to the box.  Clinical Observations/Feedback: Patient came to group and expressed that his coping skills consist of his hobbies.   Individual was social with peers and staff while participating in the intervention.  Kialee Kham LRT/CTRS         Taariq Leitz 04/01/2020 1:59 PM

## 2020-04-01 NOTE — [facility] Counselor (Signed)
CSW contacted the following seeking bed plaement:  [place], [phone] No beds available.  CSW was asked to call Emeline Darling at [phone] to check on any other possibilities.  CSW did call and left HIPPA compliant voicemail.   [address], [phone] CSW left HIPPA compliant voicemail.  [address], [phone] No male beds available.     [facility], [phone] Staff member is unsure.  Requested that information be sent to fax: [phone] attention Ms. Camp.    CSW followed up with Claiborne Billings at [facility], [phone], fax [phone].  He reports that he is going to have the nurse review and hopefully have an answer by Friday. He reports that he would enroll the patient in [facility] for therapy as well as [facility].  He reported that a Lucianne Lei would be available to transport the patient to and from.   Assunta Curtis, MSW, LCSW 04/01/2020 11:44 AM

## 2020-04-01 NOTE — Progress Notes (Signed)
[place] NOVEL CORONAVIRUS (COVID-19) DAILY CHECK-OFF SYMPTOMS - answer yes or no to each - every day NO YES  Have you had a fever in the past 24 hours?  . Fever (Temp > 37.80C / 100F) X   Have you had any of these symptoms in the past 24 hours? . New Cough .  Sore Throat  .  Shortness of Breath .  Difficulty Breathing .  Unexplained Body Aches   X   Have you had any one of these symptoms in the past 24 hours not related to allergies?   . Runny Nose .  Nasal Congestion .  Sneezing   X   If you have had runny nose, nasal congestion, sneezing in the past 24 hours, has it worsened?  X   EXPOSURES - check yes or no X   Have you traveled outside the state in the past 14 days?  X   Have you been in contact with someone with a confirmed diagnosis of COVID-19 or PUI in the past 14 days without wearing appropriate PPE?  X   Have you been living in the same home as a person with confirmed diagnosis of COVID-19 or a PUI (household contact)?    X   Have you been diagnosed with COVID-19?    X              What to do next: Answered NO to all: Answered YES to anything:   Proceed with unit schedule Follow the [facility] Inpatient Flowsheet.   Pt remains emotionally labile. Tearful at times when conversations don't go his way. Denies SI, HI, AVH and pain when assessed. Continues to state that he wants to be more independent in reference to his disposition status. Observed to be demanding on interactions with peers in dayroom with TV remote and wanting to watch his shows. Visible in scheduled groups and was engaged.  Emotional support and encouragement provided to pt throughout this shift as needed. Medications given as ordered with verbal education and effect monitored. Q 15 minutes safety checks continues without self harm gestures or outburst to note thus far.  Pt receptive to care. Tolerates all PO intake well. Denies concerns at this time. Getting needs met safely on unit.

## 2020-04-01 NOTE — Care Management (Signed)
CMA contacted the following seeking possible placement  [facility], [phone] - no beds available  [facility] [phone]-  Called twice, phone rang and rolled over to fax machine both times  [facility],[phone] - no beds available  [facility], [phone]- phone rang, no answer, no voicemail

## 2020-04-02 DIAGNOSIS — F250 Schizoaffective disorder, bipolar type: Secondary | ICD-10-CM | POA: Diagnosis not present

## 2020-04-02 NOTE — Progress Notes (Signed)
[facility] MD Progress Note  04/02/2020 4:41 PM Alexander Beard  MRN:  [id number] Subjective: Follow-up patient with developmental disability and schizoaffective disorder.  He came to me this afternoon very worked up.  Same stuff as always.  Almost over the top with anxiety and agitation about whether or not he would be able to watch his favorite television shows in the future.  Really stuffed which I could make no reply that would not of upsetting him more.  Behavior on the unit mostly calm.  He usually keeps his agitation pretty much under wraps are confined to the nursing staff Principal Problem: Schizoaffective disorder, bipolar type ([place]) Diagnosis: Principal Problem:   Schizoaffective disorder, bipolar type ([place]) Active Problems:   Active autistic disorder   Diabetes ([place])   Hypertension   Prostate hypertrophy   Intellectual disability   At risk for elopement  Total Time spent with patient: 30 minutes  Past Psychiatric History: Past history of longstanding chronic mental health and behavior problems  Past Medical History:  Past Medical History:  Diagnosis Date  . Anxiety   . Anxiety disorder due to known physiological condition    HOSPITALIZED 10/18  . Arthritis   . Autistic disorder, residual state   . COPD (chronic obstructive pulmonary disease) ([place])   . Depression   . Developmental disorder   . Diabetes mellitus without complication ([place])   . Dyslipidemia   . Esophageal reflux   . HOH (hard of hearing)    MILDLY  . Hypertension   . Obesity   . Overactive bladder   . Palpitations    ANXIETY  . Schizophrenia, schizoaffective ([place])   . Sleep apnea   . Tremors of nervous system    HANDS DUE TO MEDICATIONS  . Urinary incontinence     Past Surgical History:  Procedure Laterality Date  . CATARACT EXTRACTION W/PHACO Left 11/14/2017   Procedure: CATARACT EXTRACTION PHACO AND INTRAOCULAR LENS PLACEMENT (IOC);  Surgeon: Birder Robson, MD;  Location: [facility] ORS;  Service:  Ophthalmology;  Laterality: Left;  [place] 00:42 AP% 14.6 CDE 6.12 Fluid pack lot # [id number]  . COLONOSCOPY  01/28/2005  . COLONOSCOPY WITH PROPOFOL N/A 05/09/2016   Procedure: COLONOSCOPY WITH PROPOFOL;  Surgeon: Manya Silvas, MD;  Location: [facility] ENDOSCOPY;  Service: Endoscopy;  Laterality: N/A;  . ESOPHAGOGASTRODUODENOSCOPY N/A 05/09/2016   Procedure: ESOPHAGOGASTRODUODENOSCOPY (EGD);  Surgeon: Manya Silvas, MD;  Location: [facility] ENDOSCOPY;  Service: Endoscopy;  Laterality: N/A;  . FRACTURE SURGERY     ORIF SHOULDER  . TONSILLECTOMY     Family History:  Family History  Problem Relation Age of Onset  . Hypertension Mother   . Stroke Father   . Heart Problems Father    Family Psychiatric  History: See previous Social History:  Social History   Substance and Sexual Activity  Alcohol Use No  . Alcohol/week: 0.0 standard drinks     Social History   Substance and Sexual Activity  Drug Use No    Social History   Socioeconomic History  . Marital status: Single    Spouse name: Not on file  . Number of children: Not on file  . Years of education: Not on file  . Highest education level: Not on file  Occupational History  . Not on file  Tobacco Use  . Smoking status: Never Smoker  . Smokeless tobacco: Never Used  Vaping Use  . Vaping Use: Never used  Substance and Sexual Activity  . Alcohol use: No  Alcohol/week: 0.0 standard drinks  . Drug use: No  . Sexual activity: Never  Other Topics Concern  . Not on file  Social History Narrative   The patient never finished high school but did get his GED. 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 He is currently in disability and [name] is his legal guardian. He has been living in group homes for many years.      No pending legal charges   Social Determinants of Health   Financial Resource Strain:   . Difficulty of Paying Living Expenses: Not on file   Food Insecurity:   . Worried About Charity fundraiser in the Last Year: Not on file  . Ran Out of Food in the Last Year: Not on file  Transportation Needs:   . Lack of Transportation (Medical): Not on file  . Lack of Transportation (Non-Medical): Not on file  Physical Activity:   . Days of Exercise per Week: Not on file  . Minutes of Exercise per Session: Not on file  Stress:   . Feeling of Stress : Not on file  Social Connections:   . Frequency of Communication with Friends and Family: Not on file  . Frequency of Social Gatherings with Friends and Family: Not on file  . Attends Religious Services: Not on file  . Active Member of Clubs or Organizations: Not on file  . Attends Archivist Meetings: Not on file  . Marital Status: Not on file   Additional Social History:                         Sleep: Fair  Appetite:  Poor  Current Medications: Current Facility-Administered Medications  Medication Dose Route Frequency Provider Last Rate Last Admin  . acetaminophen (TYLENOL) tablet 650 mg  650 mg Oral Q6H PRN [name], MD      . alum & mag hydroxide-simeth (MAALOX/MYLANTA) 200-200-20 MG/5ML suspension 30 mL  30 mL Oral Q4H PRN [name], MD      . aspirin EC tablet 81 mg  81 mg Oral Daily Dorenda Pfannenstiel, [name], MD   81 mg at 04/02/20 0846  . clonazePAM (KLONOPIN) tablet 1 mg  1 mg Oral QID Jadia Capers, [name], MD   1 mg at 04/02/20 1222  . docusate sodium (COLACE) capsule 100 mg  100 mg Oral BID Celestine Prim, [name], MD   100 mg at 04/02/20 0846  . gabapentin (NEURONTIN) capsule 100 mg  100 mg Oral TID Deaja Rizo T, MD   100 mg at 04/02/20 1222  . hydrOXYzine (ATARAX/VISTARIL) tablet 10 mg  10 mg Oral TID PRN [name], MD   10 mg at 03/30/20 2107  . influenza vac split quadrivalent PF (FLUARIX) injection 0.5 mL  0.5 mL Intramuscular Tomorrow-1000 Shaquanda Graves T, MD      . lamoTRIgine (LAMICTAL) tablet 100 mg  100 mg Oral QHS Aydeen Blume T, MD   100  mg at 04/01/20 2156  . linagliptin (TRADJENTA) tablet 5 mg  5 mg Oral Daily Kc Summerson, [name], MD   5 mg at 04/02/20 0846  . loratadine (CLARITIN) tablet 10 mg  10 mg Oral Daily Avi Archuleta, [name], MD   10 mg at 04/02/20 0845  . LORazepam (ATIVAN) tablet 2 mg  2 mg Oral Q6H PRN Luke Rigsbee, [name], MD   2 mg at 04/02/20 1543  .  magnesium hydroxide (MILK OF MAGNESIA) suspension 30 mL  30 mL Oral Daily PRN [name], MD   30 mL at 03/23/20 1344  . metFORMIN (GLUCOPHAGE) tablet 850 mg  850 mg Oral BID WC Kiari Hosmer, [name], MD   850 mg at 04/02/20 0847  . metoprolol succinate (TOPROL-XL) 24 hr tablet 50 mg  50 mg Oral Daily Ruthanna Macchia, [name], MD   50 mg at 04/02/20 0846  . oxybutynin (DITROPAN) tablet 10 mg  10 mg Oral BID Maigan Bittinger, [name], MD   10 mg at 04/02/20 0847  . pantoprazole (PROTONIX) EC tablet 40 mg  40 mg Oral Daily Antonietta Lansdowne, [name], MD   40 mg at 04/02/20 0845  . prazosin (MINIPRESS) capsule 1 mg  1 mg Oral QHS Kolby Schara, [name], MD   1 mg at 03/27/20 2156  . QUEtiapine (SEROQUEL) tablet 100 mg  100 mg Oral QID Jimena Wieczorek, [name], MD   100 mg at 04/02/20 1221  . QUEtiapine (SEROQUEL) tablet 400 mg  400 mg Oral QHS [name], NP   400 mg at 04/01/20 2156  . sertraline (ZOLOFT) tablet 100 mg  100 mg Oral BID Artemus Romanoff, [name], MD   100 mg at 04/02/20 0846  . simethicone (MYLICON) chewable tablet 80 mg  80 mg Oral TID Zacory Fiola, [name], MD   80 mg at 04/02/20 1221  . simvastatin (ZOCOR) tablet 20 mg  20 mg Oral q1800 Dashonda Bonneau, [name], MD   20 mg at 04/01/20 1701  . tamsulosin (FLOMAX) capsule 0.8 mg  0.8 mg Oral QPC supper Jeffre Enriques, [name], MD   0.8 mg at 04/01/20 1647    Lab Results: No results found for this or any previous visit (from the past 48 hour(s)).  Blood Alcohol level:  Lab Results  Component Value Date   ETH <10 01/22/2020   ETH <10 63/78/5885    Metabolic Disorder Labs: Lab Results  Component Value Date   HGBA1C 6.6 (H) 02/20/2020   MPG 142.72 02/20/2020   No results  found for: PROLACTIN Lab Results  Component Value Date   CHOL 153 04/10/2015   TRIG 166 (H) 04/10/2015   HDL 50 04/10/2015   CHOLHDL 3.1 04/10/2015   VLDL 33 04/10/2015   LDLCALC 70 04/10/2015    Physical Findings: AIMS:  , ,  ,  ,    CIWA:    COWS:     Musculoskeletal: Strength & Muscle Tone: within normal limits Gait & Station: normal Patient leans: N/A  Psychiatric Specialty Exam: Physical Exam Vitals and nursing note reviewed.  Constitutional:      Appearance: He is well-developed.  HENT:     Head: Normocephalic and atraumatic.  Eyes:     Conjunctiva/sclera: Conjunctivae normal.     Pupils: Pupils are equal, round, and reactive to light.  Cardiovascular:     Heart sounds: Normal heart sounds.  Pulmonary:     Effort: Pulmonary effort is normal.  Abdominal:     Palpations: Abdomen is soft.  Musculoskeletal:        General: Normal range of motion.     Cervical back: Normal range of motion.  Skin:    General: Skin is warm and dry.  Neurological:     General: No focal deficit present.     Mental Status: He is alert.  Psychiatric:        Attention and Perception: He is inattentive.        Mood and Affect: Mood is anxious.  Affect is labile.        Speech: Speech is rapid and pressured.        Behavior: Behavior is agitated.        Thought Content: Thought content does not include homicidal or suicidal ideation.        Cognition and Memory: Cognition is impaired. Memory is impaired.        Judgment: Judgment is impulsive.     Review of Systems  Constitutional: Negative.   HENT: Negative.   Eyes: Negative.   Respiratory: Negative.   Cardiovascular: Negative.   Gastrointestinal: Negative.   Musculoskeletal: Negative.   Skin: Negative.   Neurological: Negative.   Psychiatric/Behavioral: Positive for dysphoric mood. The patient is nervous/anxious.     Blood pressure 109/71, pulse (!) 57, temperature 98.1 F (36.7 C), temperature source Oral, resp. rate 17,  height 5\' 8"  (1.727 m), weight 104.3 kg, SpO2 97 %.Body mass index is 34.96 kg/m.  General Appearance: Casual  Eye Contact:  Good  Speech:  Clear and Coherent  Volume:  Normal  Mood:  Euthymic  Affect:  Congruent  Thought Process:  Goal Directed  Orientation:  Full (Time, Place, and Person)  Thought Content:  Illogical  Suicidal Thoughts:  No  Homicidal Thoughts:  No  Memory:  Immediate;   Fair Recent;   Fair Remote;   Fair  Judgement:  Poor  Insight:  Lacking  Psychomotor Activity:  Restlessness  Concentration:  Concentration: Poor  Recall:  Poor  Fund of Knowledge:  Fair  Language:  Fair  Akathisia:  No  Handed:  Right  AIMS (if indicated):     Assets:  Desire for Improvement  ADL's:  Impaired  Cognition:  Impaired,  Mild  Sleep:  Number of Hours: 7.5     Treatment Plan Summary: Daily contact with patient to assess and evaluate symptoms and progress in treatment, Medication management and Plan Spent a lot of time just letting him vent and giving him support for his feelings without encouraging any of this.  Arguing with him or trying to point out some of the impossibilities inevitably just getting more agitated.  Medicine overall seems to be adequate.  No change to current plan  Alethia Berthold, MD 04/02/2020, 4:41 PM

## 2020-04-02 NOTE — BHH Group Notes (Addendum)
[facility] Group Notes:  (Nursing/MHT/Case Management/Adjunct)  Date:  04/02/2020  Time:  10:09 PM  Type of Therapy:  Group Therapy  Participation Level:  Active  Participation Quality:  Appropriate  Affect:  Appropriate  Cognitive:  Alert  Insight:  Good  Engagement in Group:  Engaged and goal was gone outside and said he got upset with the RN's the other day and he reported them and said he don't want it to happen again.  Modes of Intervention:  Support  Summary of Progress/Problems:  Alexander Beard 04/02/2020, 10:09 PM

## 2020-04-02 NOTE — Plan of Care (Signed)
Out of bed and active in the milieu. Currently attending recreational activities. Alert and oriented. Denying thoughts of self harm. Had breakfast and received AM medications. No change from baseline.

## 2020-04-02 NOTE — Progress Notes (Signed)
Recreation Therapy Notes  Date: 04/02/2020  Time: 9:30 am  Location: Room 21    Behavioral response: Appropriate   Intervention Topic: Animal Assisted Therapy   Discussion/Intervention:  Animal Assisted Therapy took place today during group.  Animal Assisted Therapy is the planned inclusion of an animal in a patient's treatment plan. The patients were able to engage in therapy with an animal during group. Participants were educated on what a service dog is and the different between a support dog and a service dog. Patient were informed on the many animal needs there are and how their needs are similar. Individuals were enlightened on the process to get a service animal or support animal. Patients got the opportunity to pet the animal and were offered emotional support from the animal and staff.  Clinical Observations/Feedback:  Patient came to group and was on topic and was focused on what peers and staff had to say. Participant shared their experiences and history with animals. Individual was social with peers, staff and animal while participating in group.  Gabrelle Roca LRT/CTRS         Amore Grater 04/02/2020 12:35 PM

## 2020-04-02 NOTE — Progress Notes (Signed)
Patient presents with anxiety stating to this writer, "I can't stand it here any more, I need find somewhere to go where I can watch my shows." Patient given support and encouragement. Patient denies SI/HI/AVH. Patient compliant with medication administration per MD orders.Patient given education, support, and encouragement to be active in his treatment plan. Patient being monitored Q 15 minutes for safety per unit protocol. Pt remains safe on the unit.

## 2020-04-02 NOTE — Progress Notes (Signed)
BRIEF PHARMACY NOTE   This patient attended and participated in Medication Management Group counseling led by [facility] staff pharmacist.  This interactive class reviews basic information about prescription medications and education on personal responsibility in medication management.  The class also includes general knowledge of 3 main classes of behavioral medications, including antipsychotics, antidepressants, and mood stabilizers.     Patient behavior was appropriate for group setting.   Educational materials sourced from:  "Medication Do's and Don'ts" from [email or web address]   "Mental Health Medications" from [place] [email or web address]#part [place], PharmD, BCPS Clinical Pharmacist 04/02/2020 3:15 PM

## 2020-04-02 NOTE — Progress Notes (Signed)
Patient became increasingly anxious reporting that "I am tired of being here..., nothing good will come out of this and I know why I am not finding a place, its because of my record...they shouldn't look at my record, they should look in my head, I am not the same person...". Patient cried and stated that "this place is good for some people around here, but not for me...".  Received Ativan 2mg  PO as prescribed. Patient calmed down and was back in the milieu, pleasant.

## 2020-04-02 NOTE — BHH Counselor (Signed)
[facility] LCSW Note  04/02/2020   3:32 PM  Type of Contact and Topic:  Discharge Planning and Placement coordination  CSW contacted [facility] Director, Claiborne Billings, to follow up again on referral faxed regarding bed availability. Home director detailed status still being under review and noted concerns surrounding elopement behaviors exhibited by pt. Director detailed being in consultation with home care nurse to determine specific needs to assist pt. Director relayed efforts to connect with pt's brother as well as [facility] home in order to obtain additional information from pt's history. Director expressed preference to conduct virtual interview with pt after obtaining additional information from brother and D[facility] home.  CSW team will continue efforts to follow up on bed availability.    Blane Ohara, LCSW 04/02/2020  3:32 PM

## 2020-04-02 NOTE — BHH Counselor (Signed)
[facility] LCSW Note  04/02/2020   11:25AM  Type of Contact and Topic:  Care Planning  CSW attempted to contact [facility] staff member Claiborne Billings in order to follow up on referral faxed regarding bed availability. CSW left HIPPA compliant voicemail requesting return contact in relation to pt referral.  CSW team will make additional efforts at a later time.    Blane Ohara, LCSW 04/02/2020  11:31 AM

## 2020-04-02 NOTE — Plan of Care (Signed)
Patient presents at his baseline  Problem: Education: Goal: Emotional status will improve Outcome: Not Progressing Goal: Mental status will improve Outcome: Not Progressing   

## 2020-04-03 DIAGNOSIS — F250 Schizoaffective disorder, bipolar type: Secondary | ICD-10-CM | POA: Diagnosis not present

## 2020-04-03 MED ORDER — ONDANSETRON HCL 4 MG PO TABS
4.0000 mg | ORAL_TABLET | Freq: Three times a day (TID) | ORAL | Status: DC | PRN
  Administered 2020-04-03 – 2020-04-30 (×4): 4 mg via ORAL
  Filled 2020-04-03 (×4): qty 1

## 2020-04-03 NOTE — Progress Notes (Signed)
[facility] MD Progress Note  04/03/2020 2:45 PM Alexander Beard  MRN:  [id number] Subjective: Follow-up for this patient with developmental disability.  He is stable in his behavior.  He has had a very good day today because there were nursing students here all day and he delights in being able to interact with people who will pay attention to him.  Not throwing up today although he claims some nausea early in the morning.  No new physical complaints Principal Problem: Schizoaffective disorder, bipolar type ([place]) Diagnosis: Principal Problem:   Schizoaffective disorder, bipolar type ([place]) Active Problems:   Active autistic disorder   Diabetes ([place])   Hypertension   Prostate hypertrophy   Intellectual disability   At risk for elopement  Total Time spent with patient: 30 minutes  Past Psychiatric History: Past history of longstanding disability  Past Medical History:  Past Medical History:  Diagnosis Date  . Anxiety   . Anxiety disorder due to known physiological condition    HOSPITALIZED 10/18  . Arthritis   . Autistic disorder, residual state   . COPD (chronic obstructive pulmonary disease) ([place])   . Depression   . Developmental disorder   . Diabetes mellitus without complication ([place])   . Dyslipidemia   . Esophageal reflux   . HOH (hard of hearing)    MILDLY  . Hypertension   . Obesity   . Overactive bladder   . Palpitations    ANXIETY  . Schizophrenia, schizoaffective ([place])   . Sleep apnea   . Tremors of nervous system    HANDS DUE TO MEDICATIONS  . Urinary incontinence     Past Surgical History:  Procedure Laterality Date  . CATARACT EXTRACTION W/PHACO Left 11/14/2017   Procedure: CATARACT EXTRACTION PHACO AND INTRAOCULAR LENS PLACEMENT (IOC);  Surgeon: Birder Robson, MD;  Location: [facility] ORS;  Service: Ophthalmology;  Laterality: Left;  [place] 00:42 AP% 14.6 CDE 6.12 Fluid pack lot # [id number]  . COLONOSCOPY  01/28/2005  . COLONOSCOPY WITH PROPOFOL N/A 05/09/2016    Procedure: COLONOSCOPY WITH PROPOFOL;  Surgeon: Manya Silvas, MD;  Location: [facility] ENDOSCOPY;  Service: Endoscopy;  Laterality: N/A;  . ESOPHAGOGASTRODUODENOSCOPY N/A 05/09/2016   Procedure: ESOPHAGOGASTRODUODENOSCOPY (EGD);  Surgeon: Manya Silvas, MD;  Location[facility] ENDOSCOPY;  Service: Endoscopy;  Laterality: N/A;  . FRACTURE SURGERY     ORIF SHOULDER  . TONSILLECTOMY     Family History:  Family History  Problem Relation Age of Onset  . Hypertension Mother   . Stroke Father   . Heart Problems Father    Family Psychiatric  History: See previous Social History:  Social History   Substance and Sexual Activity  Alcohol Use No  . Alcohol/week: 0.0 standard drinks     Social History   Substance and Sexual Activity  Drug Use No    Social History   Socioeconomic History  . Marital status: Single    Spouse name: Not on file  . Number of children: Not on file  . Years of education: Not on file  . Highest education level: Not on file  Occupational History  . Not on file  Tobacco Use  . Smoking status: Never Smoker  . Smokeless tobacco: Never Used  Vaping Use  . Vaping Use: Never used  Substance and Sexual Activity  . Alcohol use: No    Alcohol/week: 0.0 standard drinks  . Drug use: No  . Sexual activity: Never  Other Topics Concern  . Not on file  Social History  Narrative   The patient never finished high school but did get his GED. 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 He is currently in disability and his brother Alexander Beard is his legal guardian. He has been living in group homes for many years.      No pending legal charges   Social Determinants of Health   Financial Resource Strain:   . Difficulty of Paying Living Expenses: Not on file  Food Insecurity:   . Worried About Charity fundraiser in the Last Year: Not on file  . Ran Out of Food in the Last Year: Not on file  Transportation Needs:   .  Lack of Transportation (Medical): Not on file  . Lack of Transportation (Non-Medical): Not on file  Physical Activity:   . Days of Exercise per Week: Not on file  . Minutes of Exercise per Session: Not on file  Stress:   . Feeling of Stress : Not on file  Social Connections:   . Frequency of Communication with Friends and Family: Not on file  . Frequency of Social Gatherings with Friends and Family: Not on file  . Attends Religious Services: Not on file  . Active Member of Clubs or Organizations: Not on file  . Attends Archivist Meetings: Not on file  . Marital Status: Not on file   Additional Social History:                         Sleep: Fair  Appetite:  Fair  Current Medications: Current Facility-Administered Medications  Medication Dose Route Frequency Provider Last Rate Last Admin  . acetaminophen (TYLENOL) tablet 650 mg  650 mg Oral Q6H PRN [name], MD      . alum & mag hydroxide-simeth (MAALOX/MYLANTA) 200-200-20 MG/5ML suspension 30 mL  30 mL Oral Q4H PRN [name], MD      . aspirin EC tablet 81 mg  81 mg Oral Daily Ketura Sirek, [name], MD   81 mg at 04/03/20 0914  . clonazePAM (KLONOPIN) tablet 1 mg  1 mg Oral QID Lilith Solana, [name], MD   1 mg at 04/03/20 1154  . docusate sodium (COLACE) capsule 100 mg  100 mg Oral BID Girtha Kilgore T, MD   100 mg at 04/02/20 1700  . gabapentin (NEURONTIN) capsule 100 mg  100 mg Oral TID Marqui Formby T, MD   100 mg at 04/03/20 1154  . hydrOXYzine (ATARAX/VISTARIL) tablet 10 mg  10 mg Oral TID PRN [name], MD   10 mg at 03/30/20 2107  . influenza vac split quadrivalent PF (FLUARIX) injection 0.5 mL  0.5 mL Intramuscular Tomorrow-1000 Paytin Ramakrishnan T, MD      . lamoTRIgine (LAMICTAL) tablet 100 mg  100 mg Oral QHS Emily Massar T, MD   100 mg at 04/02/20 2128  . linagliptin (TRADJENTA) tablet 5 mg  5 mg Oral Daily Jerod Mcquain, [name], MD   5 mg at 04/03/20 0914  . loratadine (CLARITIN) tablet 10 mg  10 mg  Oral Daily Kensington Duerst, [name], MD   10 mg at 04/03/20 0914  . LORazepam (ATIVAN) tablet 2 mg  2 mg Oral Q6H PRN Calven Gilkes, [name], MD   2 mg at 04/03/20 1154  . magnesium hydroxide (MILK OF MAGNESIA) suspension 30 mL  30 mL Oral Daily PRN [name], MD   30 mL at 03/23/20 1344  .  metFORMIN (GLUCOPHAGE) tablet 850 mg  850 mg Oral BID WC Zavia Pullen, [name], MD   850 mg at 04/03/20 0915  . metoprolol succinate (TOPROL-XL) 24 hr tablet 50 mg  50 mg Oral Daily Vickii Volland, [name], MD   50 mg at 04/03/20 0913  . ondansetron (ZOFRAN) tablet 4 mg  4 mg Oral Q8H PRN Solana Coggin, [name], MD   4 mg at 04/03/20 0909  . oxybutynin (DITROPAN) tablet 10 mg  10 mg Oral BID Saryna Kneeland, [name], MD   10 mg at 04/03/20 0914  . pantoprazole (PROTONIX) EC tablet 40 mg  40 mg Oral Daily Edom Schmuhl, [name], MD   40 mg at 04/03/20 0914  . prazosin (MINIPRESS) capsule 1 mg  1 mg Oral QHS Neola Worrall, [name], MD   1 mg at 03/27/20 2156  . QUEtiapine (SEROQUEL) tablet 100 mg  100 mg Oral QID Maple Odaniel T, MD   100 mg at 04/03/20 1154  . QUEtiapine (SEROQUEL) tablet 400 mg  400 mg Oral QHS [name], NP   400 mg at 04/02/20 2128  . sertraline (ZOLOFT) tablet 100 mg  100 mg Oral BID Vennie Salsbury, [name], MD   100 mg at 04/03/20 0914  . simethicone (MYLICON) chewable tablet 80 mg  80 mg Oral TID Jishnu Jenniges, [name], MD   80 mg at 04/03/20 1154  . simvastatin (ZOCOR) tablet 20 mg  20 mg Oral q1800 Reem Fleury, [name], MD   20 mg at 04/02/20 1836  . tamsulosin (FLOMAX) capsule 0.8 mg  0.8 mg Oral QPC supper Nemesio Castrillon, [name], MD   0.8 mg at 04/02/20 1836    Lab Results: No results found for this or any previous visit (from the past 48 hour(s)).  Blood Alcohol level:  Lab Results  Component Value Date   ETH <10 01/22/2020   ETH <10 25/85/2778    Metabolic Disorder Labs: Lab Results  Component Value Date   HGBA1C 6.6 (H) 02/20/2020   MPG 142.72 02/20/2020   No results found for: PROLACTIN Lab Results  Component Value Date   CHOL 153  04/10/2015   TRIG 166 (H) 04/10/2015   HDL 50 04/10/2015   CHOLHDL 3.1 04/10/2015   VLDL 33 04/10/2015   LDLCALC 70 04/10/2015    Physical Findings: AIMS:  , ,  ,  ,    CIWA:    COWS:     Musculoskeletal: Strength & Muscle Tone: within normal limits Gait & Station: normal Patient leans: N/A  Psychiatric Specialty Exam: Physical Exam Vitals and nursing note reviewed.  Constitutional:      Appearance: He is well-developed.  HENT:     Head: Normocephalic and atraumatic.  Eyes:     Conjunctiva/sclera: Conjunctivae normal.     Pupils: Pupils are equal, round, and reactive to light.  Cardiovascular:     Heart sounds: Normal heart sounds.  Pulmonary:     Effort: Pulmonary effort is normal.  Abdominal:     Palpations: Abdomen is soft.  Musculoskeletal:        General: Normal range of motion.     Cervical back: Normal range of motion.  Skin:    General: Skin is warm and dry.  Neurological:     General: No focal deficit present.     Mental Status: He is alert.  Psychiatric:        Attention and Perception: Attention normal.        Mood and Affect: Mood is anxious.  Speech: Speech normal.        Behavior: Behavior is agitated. Behavior is not aggressive.        Thought Content: Thought content is not paranoid. Thought content does not include homicidal or suicidal ideation.        Cognition and Memory: Cognition is impaired.        Judgment: Judgment is impulsive.     Review of Systems  Constitutional: Negative.   HENT: Negative.   Eyes: Negative.   Respiratory: Negative.   Cardiovascular: Negative.   Gastrointestinal: Negative.   Musculoskeletal: Negative.   Skin: Negative.   Neurological: Negative.   Psychiatric/Behavioral: The patient is nervous/anxious.     Blood pressure 129/82, pulse 75, temperature 98.9 F (37.2 C), temperature source Oral, resp. rate 17, height 5\' 8"  (1.727 m), weight 104.3 kg, SpO2 99 %.Body mass index is 34.96 kg/m.  General  Appearance: Casual  Eye Contact:  Good  Speech:  Clear and Coherent  Volume:  Normal  Mood:  Euthymic  Affect:  Constricted  Thought Process:  Goal Directed  Orientation:  Full (Time, Place, and Person)  Thought Content:  Logical  Suicidal Thoughts:  No  Homicidal Thoughts:  No  Memory:  Immediate;   Fair Recent;   Fair Remote;   Fair  Judgement:  Fair  Insight:  Fair  Psychomotor Activity:  Normal  Concentration:  Concentration: Fair  Recall:  [employer] of Knowledge:  Fair  Language:  Fair  Akathisia:  No  Handed:  Right  AIMS (if indicated):     Assets:  Desire for Improvement  ADL's:  Intact  Cognition:  Impaired,  Mild  Sleep:  Number of Hours: 7     Treatment Plan Summary: Plan No change to medication.  Supportive therapy as always.  And encouragement in his behavior.  Still working on discharge  Alethia Berthold, MD 04/03/2020, 2:45 PM

## 2020-04-03 NOTE — [facility] Group Notes (Signed)
[facility] Group Notes:  (Nursing/MHT/Case Management/Adjunct)  Date:  04/03/2020  Time:  10:09 AM  Type of Therapy:  COMMUNITY MEETING  Participation Level:  Did Not Attend   Summary of Progress/Problems:  Charna Busman 04/03/2020, 10:09 AM

## 2020-04-03 NOTE — Progress Notes (Signed)
D: Pt alert and oriented. Pt rates depression 0/10, hopelessness 0/10, and anxiety 0/10. Pt reports energy level as normal and concentration as being good. Pt reports sleep last night as being good. Pt did not receive medications for sleep. Pt denies experiencing any pain at this time. Pt denies experiencing any HI, or AVH at this time, however endorses SI w/o a plan. Pt states he feels he can be safe while here.  When this writer went to get this pt for breakfast and morning meds the pt stated that he was really tired because he stayed up working on a project for a pt that would be leaving today. Pt stated he just didn't think he could get up right now then continued to tell this writer how he felt nauseous and was having loose stools. This Probation officer got an order for zofran and administered it to the pt. This pt also shared that he was depressed from being here and then asked are the nursing students here because they are the only thing that bring him joy. This pt requested he be woken up when they arrive. This Probation officer replied that if he didn't feel well and was tired that maybe he should just rest.   A: Scheduled medications administered to pt, per MD orders. Support and encouragement provided. Frequent verbal contact made. Routine safety checks conducted q15 minutes.   R: No adverse drug reactions noted. Pt verbally contracts for safety at this time. Pt complaint with medications and treatment plan. Pt interacts well with others on the unit. Pt remains safe at this time. Will continue to monitor.

## 2020-04-03 NOTE — Progress Notes (Addendum)
Recreation Therapy Notes   Date: 04/03/2020  Time: 9:30 am   Location: Craft room     Behavioral response: N/A   Intervention Topic: Goals   Discussion/Intervention: Patient did not attend group.   Clinical Observations/Feedback:  Patient did not attend group.   Junie Avilla LRT/CTRS        Rayelle Armor 04/03/2020 11:41 AM

## 2020-04-04 NOTE — Progress Notes (Signed)
Patient has been in bed all shift. He denies SI, HI and AVH. Is still very fixated on wanting to live where he can have his own TV because his TV shows are very important to him. He says he has a TV at his brother's house. He is tearful, stating he has been here way too long and is despairing of finding placement.

## 2020-04-04 NOTE — Progress Notes (Signed)
[facility] MD Progress Note  04/04/2020 9:24 AM Alexander Beard  MRN:  [id number] Subjective:   Alexander Beard is being seen for the first time today.  He was seen for psychiatric evaluation on February 20, 2020.    He was seen laying in his bed.  Tells me that staying in bed in the morning helps him feel better physically.  No new medical issues.  Emotionally, says that he has "some of both," good and sad moments.  There is that the highlight of his day is spending time with the nursing students and the ability to go outside.  Says that being stuck inside worsens his mood.  Feels that his medications are working.  States that he slept fair last night.  Tolerating his medications.  Denies have any safety concerns today.  Denies symptoms of psychosis.   Principal Problem: Schizoaffective disorder, bipolar type ([place]) Diagnosis: Principal Problem:   Schizoaffective disorder, bipolar type ([place]) Active Problems:   Active autistic disorder   Diabetes ([place])   Hypertension   Prostate hypertrophy   Intellectual disability   At risk for elopement  Total Time spent with patient: 27 minutes.   Past Psychiatric History: Past history of longstanding disability  Past Medical History:  Past Medical History:  Diagnosis Date  . Anxiety   . Anxiety disorder due to known physiological condition    HOSPITALIZED 10/18  . Arthritis   . Autistic disorder, residual state   . COPD (chronic obstructive pulmonary disease) ([place])   . Depression   . Developmental disorder   . Diabetes mellitus without complication ([place])   . Dyslipidemia   . Esophageal reflux   . HOH (hard of hearing)    MILDLY  . Hypertension   . Obesity   . Overactive bladder   . Palpitations    ANXIETY  . Schizophrenia, schizoaffective ([place])   . Sleep apnea   . Tremors of nervous system    HANDS DUE TO MEDICATIONS  . Urinary incontinence     Past Surgical History:  Procedure Laterality Date  . CATARACT EXTRACTION W/PHACO Left  11/14/2017   Procedure: CATARACT EXTRACTION PHACO AND INTRAOCULAR LENS PLACEMENT (IOC);  Surgeon: Birder Robson, MD;  Location: [facility] ORS;  Service: Ophthalmology;  Laterality: Left;  [place] 00:42 AP% 14.6 CDE 6.12 Fluid pack lot # [id number]  . COLONOSCOPY  01/28/2005  . COLONOSCOPY WITH PROPOFOL N/A 05/09/2016   Procedure: COLONOSCOPY WITH PROPOFOL;  Surgeon: Manya Silvas, MD;  Location: [facility] ENDOSCOPY;  Service: Endoscopy;  Laterality: N/A;  . ESOPHAGOGASTRODUODENOSCOPY N/A 05/09/2016   Procedure: ESOPHAGOGASTRODUODENOSCOPY (EGD);  Surgeon: Manya Silvas, MD;  Location: [facility] ENDOSCOPY;  Service: Endoscopy;  Laterality: N/A;  . FRACTURE SURGERY     ORIF SHOULDER  . TONSILLECTOMY     Family History:  Family History  Problem Relation Age of Onset  . Hypertension Mother   . Stroke Father   . Heart Problems Father    Family Psychiatric  History: See previous Social History:  Social History   Substance and Sexual Activity  Alcohol Use No  . Alcohol/week: 0.0 standard drinks     Social History   Substance and Sexual Activity  Drug Use No    Social History   Socioeconomic History  . Marital status: Single    Spouse name: Not on file  . Number of children: Not on file  . Years of education: Not on file  . Highest education level: Not on file  Occupational History  . Not  on file  Tobacco Use  . Smoking status: Never Smoker  . Smokeless tobacco: Never Used  Vaping Use  . Vaping Use: Never used  Substance and Sexual Activity  . Alcohol use: No    Alcohol/week: 0.0 standard drinks  . Drug use: No  . Sexual activity: Never  Other Topics Concern  . Not on file  Social History Narrative   The patient never finished high school but did get his GED. 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 He is currently in disability and his brother Alexander Beard is his legal guardian. He has been living in group homes for many years.       No pending legal charges   Social Determinants of Health   Financial Resource Strain:   . Difficulty of Paying Living Expenses: Not on file  Food Insecurity:   . Worried About Charity fundraiser in the Last Year: Not on file  . Ran Out of Food in the Last Year: Not on file  Transportation Needs:   . Lack of Transportation (Medical): Not on file  . Lack of Transportation (Non-Medical): Not on file  Physical Activity:   . Days of Exercise per Week: Not on file  . Minutes of Exercise per Session: Not on file  Stress:   . Feeling of Stress : Not on file  Social Connections:   . Frequency of Communication with Friends and Family: Not on file  . Frequency of Social Gatherings with Friends and Family: Not on file  . Attends Religious Services: Not on file  . Active Member of Clubs or Organizations: Not on file  . Attends Archivist Meetings: Not on file  . Marital Status: Not on file   Additional Social History:                         Sleep: Fair  Appetite:  Fair  Current Medications: Current Facility-Administered Medications  Medication Dose Route Frequency Provider Last Rate Last Admin  . acetaminophen (TYLENOL) tablet 650 mg  650 mg Oral Q6H PRN [name], MD      . alum & mag hydroxide-simeth (MAALOX/MYLANTA) 200-200-20 MG/5ML suspension 30 mL  30 mL Oral Q4H PRN [name], MD      . aspirin EC tablet 81 mg  81 mg Oral Daily [name], [name], MD   81 mg at 04/04/20 0831  . clonazePAM (KLONOPIN) tablet 1 mg  1 mg Oral QID [name], [name], MD   1 mg at 04/04/20 9702  . docusate sodium (COLACE) capsule 100 mg  100 mg Oral BID [name], [name], MD   100 mg at 04/04/20 0831  . gabapentin (NEURONTIN) capsule 100 mg  100 mg Oral TID [name], [name], MD   100 mg at 04/04/20 6378  . hydrOXYzine (ATARAX/VISTARIL) tablet 10 mg  10 mg Oral TID PRN [name], MD   10 mg at 03/30/20 2107  . influenza vac split quadrivalent PF (FLUARIX)  injection 0.5 mL  0.5 mL Intramuscular Tomorrow-1000 [name], [name], MD      . lamoTRIgine (LAMICTAL) tablet 100 mg  100 mg Oral QHS [name], [name], MD   100 mg at 04/03/20 2131  . linagliptin (TRADJENTA) tablet 5 mg  5 mg Oral Daily [name], [name], MD   5 mg at 04/04/20 0831  . loratadine (CLARITIN) tablet 10 mg  10  mg Oral Daily [name], [name], MD   10 mg at 04/04/20 3875  . LORazepam (ATIVAN) tablet 2 mg  2 mg Oral Q6H PRN [name], [name], MD   2 mg at 04/03/20 1154  . magnesium hydroxide (MILK OF MAGNESIA) suspension 30 mL  30 mL Oral Daily PRN [name], MD   30 mL at 03/23/20 1344  . metFORMIN (GLUCOPHAGE) tablet 850 mg  850 mg Oral BID WC [name], [name], MD   850 mg at 04/04/20 0831  . metoprolol succinate (TOPROL-XL) 24 hr tablet 50 mg  50 mg Oral Daily [name], [name], MD   50 mg at 04/04/20 6433  . ondansetron (ZOFRAN) tablet 4 mg  4 mg Oral Q8H PRN [name], [name], MD   4 mg at 04/03/20 0909  . oxybutynin (DITROPAN) tablet 10 mg  10 mg Oral BID [name], [name], MD   10 mg at 04/04/20 0831  . pantoprazole (PROTONIX) EC tablet 40 mg  40 mg Oral Daily [name], [name], MD   40 mg at 04/04/20 0831  . prazosin (MINIPRESS) capsule 1 mg  1 mg Oral QHS [name], [name], MD   1 mg at 03/27/20 2156  . QUEtiapine (SEROQUEL) tablet 100 mg  100 mg Oral QID [name], [name], MD   100 mg at 04/04/20 2951  . QUEtiapine (SEROQUEL) tablet 400 mg  400 mg Oral QHS [name], NP   400 mg at 04/03/20 2131  . sertraline (ZOLOFT) tablet 100 mg  100 mg Oral BID [name], [name], MD   100 mg at 04/04/20 8841  . simethicone (MYLICON) chewable tablet 80 mg  80 mg Oral TID [name], [name], MD   80 mg at 04/04/20 0831  . simvastatin (ZOCOR) tablet 20 mg  20 mg Oral q1800 [name], [name], MD   20 mg at 04/03/20 1720  . tamsulosin (FLOMAX) capsule 0.8 mg  0.8 mg Oral QPC supper [name], [name], MD   0.8 mg at 04/03/20 1718    Lab Results: No results found for this or any previous visit (from the  past 48 hour(s)).  Blood Alcohol level:  Lab Results  Component Value Date   ETH <10 01/22/2020   ETH <10 66/12/3014    Metabolic Disorder Labs: Lab Results  Component Value Date   HGBA1C 6.6 (H) 02/20/2020   MPG 142.72 02/20/2020   No results found for: PROLACTIN Lab Results  Component Value Date   CHOL 153 04/10/2015   TRIG 166 (H) 04/10/2015   HDL 50 04/10/2015   CHOLHDL 3.1 04/10/2015   VLDL 33 04/10/2015   LDLCALC 70 04/10/2015    Physical Findings: AIMS:  , ,  ,  ,    CIWA:    COWS:     Musculoskeletal: Strength & Muscle Tone: within normal limits Gait & Station: normal Patient leans: N/A  Psychiatric Specialty Exam:   Review of Systems  Constitutional: Negative.   HENT: Negative.   Eyes: Negative.   Respiratory: Negative.   Cardiovascular: Negative.   Gastrointestinal: Negative.   Musculoskeletal: Negative.   Skin: Negative.   Neurological: Negative.   Psychiatric/Behavioral: The patient is not nervous/anxious.     Blood pressure 108/72, pulse 61, temperature 98.1 F (36.7 C), temperature source Oral, resp. rate 18, height 5\' 8"  (1.727 m), weight 104.3 kg, SpO2 99 %.Body mass index is 34.96 kg/m.  General Appearance: Casual, Cooperateive, has his eyes closed  Eye Contact:  Good  Speech:  Volume:  Normal  Mood:  Euthymic  Affect:  Restricted  Thought Process:  Goal Directed  Orientation:  Full (Time, Place, and Person)  Thought Content:  Logical  Suicidal Thoughts:  No  Homicidal Thoughts:  No  Memory:  Immediate;   Fair Recent;   Fair Remote;   Fair  Judgement:  Fair  Insight:  Fair  Psychomotor Activity:  Normal  Concentration:  Concentration: Fair  Recall:  [employer] of Knowledge:  Fair  Language:  Fair  Akathisia:  No  Handed:  Right  AIMS (if indicated):     Assets:  Desire for Improvement  ADL's:  Intact  Cognition:  Impaired,  Mild  Sleep:  Number of Hours: 8     Treatment Plan Summary: Plan No change to medication.   Supportive therapy as always.  And encouragement in his behavior.  Still working on discharge   04/04/2020 No changes  [name], MD 04/04/2020, 9:24 AM

## 2020-04-04 NOTE — [facility] Group Notes (Signed)
LCSW Aftercare Discharge Planning Group Note   04/04/2020 1:00-1:45 PM   Type of Group and Topic: Psychoeducational Group:  Discharge Planning  Participation Level:  Active  Description of Group  Discharge planning group reviews patient's anticipated discharge plans and assists patients to anticipate and address any barriers to wellness/recovery in the community.  Suicide prevention education is reviewed with patients in group.  Therapeutic Goals 1. Patients will state their anticipated discharge plan and mental health aftercare 2. Patients will identify potential barriers to wellness in the community setting 3. Patients will engage in problem solving, solution focused discussion of ways to anticipate and address barriers to wellness/recovery  Summary of Patient Progress: Patient is anxious and nervous about going to another group home. Patient is concerned that they will not have his same TV shows. Patient spoke about peer support and getting back to the things that he used to do.      Therapeutic Modalities: Motivational Interviewing    [name], [place] 04/04/2020 4:10 PM

## 2020-04-04 NOTE — Plan of Care (Signed)
  Problem: Education: Goal: Knowledge of [facility] Education information/materials will improve Outcome: Progressing Goal: Emotional status will improve Outcome: Progressing Goal: Mental status will improve Outcome: Progressing Goal: Verbalization of understanding the information provided will improve Outcome: Progressing   Problem: Activity: Goal: Interest or engagement in activities will improve Outcome: Progressing Goal: Sleeping patterns will improve Outcome: Progressing   

## 2020-04-04 NOTE — Tx Team (Signed)
Interdisciplinary Treatment and Diagnostic Plan Update  04/04/2020 Time of Session: 10:00 AM  Alexander Beard MRN: [id number]  Principal Diagnosis: Schizoaffective disorder, bipolar type (Alexander Beard)  Secondary Diagnoses: Principal Problem:   Schizoaffective disorder, bipolar type (Alexander Beard) Active Problems:   Active autistic disorder   Diabetes (Alexander Beard)   Hypertension   Prostate hypertrophy   Intellectual disability   At risk for elopement   Current Medications:  Current Facility-Administered Medications  Medication Dose Route Frequency Provider Last Rate Last Admin  . acetaminophen (TYLENOL) tablet 650 mg  650 mg Oral Q6H PRN Alexander Post, MD      . alum & mag hydroxide-simeth (MAALOX/MYLANTA) 200-200-20 MG/5ML suspension 30 mL  30 mL Oral Q4H PRN Alexander Post, MD      . aspirin EC tablet 81 mg  81 mg Oral Daily Alexander Beard, Alexander Reno, MD   81 mg at 04/04/20 0831  . clonazePAM (KLONOPIN) tablet 1 mg  1 mg Oral QID Alexander Beard, Alexander Reno, MD   1 mg at 04/04/20 4098  . docusate sodium (COLACE) capsule 100 mg  100 mg Oral BID Alexander Beard, Alexander Reno, MD   100 mg at 04/04/20 0831  . gabapentin (NEURONTIN) capsule 100 mg  100 mg Oral TID Alexander Beard, Alexander Reno, MD   100 mg at 04/04/20 1191  . hydrOXYzine (ATARAX/VISTARIL) tablet 10 mg  10 mg Oral TID PRN Alexander Post, MD   10 mg at 03/30/20 2107  . influenza vac split quadrivalent PF (FLUARIX) injection 0.5 mL  0.5 mL Intramuscular Tomorrow-1000 Alexander Beard, Alexander T, MD      . lamoTRIgine (LAMICTAL) tablet 100 mg  100 mg Oral QHS Alexander Beard, Alexander T, MD   100 mg at 04/03/20 2131  . linagliptin (TRADJENTA) tablet 5 mg  5 mg Oral Daily Alexander Beard, Alexander Reno, MD   5 mg at 04/04/20 0831  . loratadine (CLARITIN) tablet 10 mg  10 mg Oral Daily Alexander Beard, Alexander Reno, MD   10 mg at 04/04/20 4782  . LORazepam (ATIVAN) tablet 2 mg  2 mg Oral Q6H PRN Alexander Beard, Alexander Reno, MD   2 mg at 04/03/20 1154  . magnesium hydroxide (MILK OF MAGNESIA) suspension 30 mL  30 mL Oral Daily PRN Alexander Post, MD   30  mL at 03/23/20 1344  . metFORMIN (GLUCOPHAGE) tablet 850 mg  850 mg Oral BID WC Alexander Beard, Alexander Reno, MD   850 mg at 04/04/20 0831  . metoprolol succinate (TOPROL-XL) 24 hr tablet 50 mg  50 mg Oral Daily Alexander Beard, Alexander Reno, MD   50 mg at 04/04/20 9562  . ondansetron (ZOFRAN) tablet 4 mg  4 mg Oral Q8H PRN Alexander Beard, Alexander Reno, MD   4 mg at 04/03/20 0909  . oxybutynin (DITROPAN) tablet 10 mg  10 mg Oral BID Alexander Beard, Alexander Reno, MD   10 mg at 04/04/20 0831  . pantoprazole (PROTONIX) EC tablet 40 mg  40 mg Oral Daily Alexander Beard, Alexander Reno, MD   40 mg at 04/04/20 0831  . prazosin (MINIPRESS) capsule 1 mg  1 mg Oral QHS Alexander Beard, Alexander Reno, MD   1 mg at 03/27/20 2156  . QUEtiapine (SEROQUEL) tablet 100 mg  100 mg Oral QID Alexander Beard, Alexander Reno, MD   100 mg at 04/04/20 1308  . QUEtiapine (SEROQUEL) tablet 400 mg  400 mg Oral QHS Alexander Sauger, NP   400 mg at 04/03/20 2131  . sertraline (ZOLOFT) tablet 100 mg  100 mg Oral BID Alexander Beard, Alexander Reno, MD   100 mg at  04/04/20 9622  . simethicone (MYLICON) chewable tablet 80 mg  80 mg Oral TID Alexander Beard, Alexander Reno, MD   80 mg at 04/04/20 0831  . simvastatin (ZOCOR) tablet 20 mg  20 mg Oral q1800 Alexander Beard, Alexander T, MD   20 mg at 04/03/20 1720  . tamsulosin (FLOMAX) capsule 0.8 mg  0.8 mg Oral QPC supper Alexander Beard, Alexander Reno, MD   0.8 mg at 04/03/20 1718   PTA Medications: Medications Prior to Admission  Medication Sig Dispense Refill Last Dose  . acetaminophen (TYLENOL) 650 MG CR tablet Take 650 mg by mouth every 12 (twelve) hours as needed for pain.     Marland Kitchen aspirin EC 81 MG tablet Take 81 mg by mouth daily.     . Calcium Carbonate-Vitamin D3 (CALCIUM 600-D) 600-400 MG-UNIT TABS Take 1 tablet by mouth 2 (two) times daily.     . Cholecalciferol (VITAMIN D-1000 MAX ST) 1000 UNITS tablet Take 1,000 Units by mouth daily.      Marland Kitchen docusate sodium (COLACE) 100 MG capsule Take 100 mg by mouth daily.      . furosemide (LASIX) 20 MG tablet Take 20 mg by mouth daily.      Marland Kitchen gabapentin (NEURONTIN) 100 MG capsule  Take 1 capsule (100 mg total) by mouth 3 (three) times daily. 90 capsule 10   . Insulin Detemir (LEVEMIR FLEXTOUCH) 100 UNIT/ML Pen Inject 15 Units into the skin daily at 10 pm.     . lamoTRIgine (LAMICTAL) 100 MG tablet Take 1 tablet (100 mg total) by mouth at bedtime. 30 tablet 10   . loratadine (CLARITIN) 10 MG tablet Take 1 tablet (10 mg total) by mouth daily. 30 tablet 5   . LORazepam (ATIVAN) 1 MG tablet Take 1 tablet (1 mg total) by mouth 3 (three) times daily. 90 tablet 5   . lurasidone (LATUDA) 40 MG TABS tablet Take 1 tablet (40 mg total) by mouth daily. with food 30 tablet 10   . metoprolol succinate (TOPROL-XL) 50 MG 24 hr tablet Take 50 mg by mouth daily.      . NON FORMULARY cpap device     . oxybutynin (DITROPAN) 5 MG tablet Take 5 mg by mouth daily.     . pantoprazole (PROTONIX) 40 MG tablet Take 40 mg by mouth daily.     . polyethylene glycol (MIRALAX / GLYCOLAX) packet Take 17 g by mouth daily.     . QUEtiapine (SEROQUEL) 100 MG tablet TAKE 1 TABLET  BY MOUTH THREE TIMES A DAY (Patient taking differently: Take 100 mg by mouth 3 (three) times daily. TAKE 1 TABLET  BY MOUTH THREE TIMES A DAY) 90 tablet 11   . QUEtiapine (SEROQUEL) 400 MG tablet Take 1 tablet (400 mg total) by mouth at bedtime. 30 tablet 10   . sertraline (ZOLOFT) 100 MG tablet Take 2 tablets (200 mg total) by mouth daily. (Patient taking differently: Take 200 mg by mouth daily. Take along with two 50 mg tablets (100 mg) for total 300 mg daily) 60 tablet 5   . sertraline (ZOLOFT) 50 MG tablet Take 2 tablets (100 mg total) by mouth daily. (Patient taking differently: Take 100 mg by mouth daily. Take along with two 100 mg tablets (200 mg) for total 300 mg daily) 60 tablet 5   . simethicone (MYLICON) 80 MG chewable tablet Chew 80-160 mg by mouth 2 (two) times daily as needed for flatulence.     . simvastatin (ZOCOR) 20 MG tablet Take 20 mg  by mouth at bedtime.      . sitaGLIPtin (JANUVIA) 100 MG tablet Take 100 mg by  mouth daily.     . solifenacin (VESICARE) 5 MG tablet Take 5 mg by mouth daily.     . Starch (HEMORRHOIDAL RE) Place 1 application rectally 4 (four) times daily as needed (for itching).     . tamsulosin (FLOMAX) 0.4 MG CAPS capsule Take 0.4 mg by mouth daily.       Patient Stressors: Health problems Marital or family conflict Medication change or noncompliance Traumatic event  Patient Strengths: Motivation for treatment/growth Religious Affiliation  Treatment Modalities: Medication Management, Group therapy, Case management,  1 to 1 session with clinician, Psychoeducation, Recreational therapy.   Physician Treatment Plan for Primary Diagnosis: Schizoaffective disorder, bipolar type ([place]) Long Term Goal(s): Improvement in symptoms so as ready for discharge Improvement in symptoms so as ready for discharge   Short Term Goals: Ability to verbalize feelings will improve Ability to demonstrate self-control will improve Ability to maintain clinical measurements within normal limits will improve Compliance with prescribed medications will improve  Medication Management: Evaluate patient's response, side effects, and tolerance of medication regimen.  Therapeutic Interventions: 1 to 1 sessions, Unit Group sessions and Medication administration.  Evaluation of Outcomes: Progressing   Physician Treatment Plan for Secondary Diagnosis: Principal Problem:   Schizoaffective disorder, bipolar type ([place]) Active Problems:   Active autistic disorder   Diabetes ([place])   Hypertension   Prostate hypertrophy   Intellectual disability   At risk for elopement  Long Term Goal(s): Improvement in symptoms so as ready for discharge Improvement in symptoms so as ready for discharge   Short Term Goals: Ability to verbalize feelings will improve Ability to demonstrate self-control will improve Ability to maintain clinical measurements within normal limits will improve Compliance with prescribed  medications will improve     Medication Management: Evaluate patient's response, side effects, and tolerance of medication regimen.  Therapeutic Interventions: 1 to 1 sessions, Unit Group sessions and Medication administration.  Evaluation of Outcomes: Progressing   RN Treatment Plan for Primary Diagnosis: Schizoaffective disorder, bipolar type ([place]) Long Term Goal(s): Knowledge of disease and therapeutic regimen to maintain health will improve  Short Term Goals: Ability to demonstrate self-control, Ability to participate in decision making will improve, Ability to verbalize feelings will improve, Ability to disclose and discuss suicidal ideas, Ability to identify and develop effective coping behaviors will improve and Compliance with prescribed medications will improve  Medication Management: RN will administer medications as ordered by provider, will assess and evaluate patient's response and provide education to patient for prescribed medication. RN will report any adverse and/or side effects to prescribing provider.  Therapeutic Interventions: 1 on 1 counseling sessions, Psychoeducation, Medication administration, Evaluate responses to treatment, Monitor vital signs and CBGs as ordered, Perform/monitor CIWA, COWS, AIMS and Fall Risk screenings as ordered, Perform wound care treatments as ordered.  Evaluation of Outcomes: Progressing   LCSW Treatment Plan for Primary Diagnosis: Schizoaffective disorder, bipolar type ([place]) Long Term Goal(s): Safe transition to appropriate next level of care at discharge, Engage patient in therapeutic group addressing interpersonal concerns.  Short Term Goals: Engage patient in aftercare planning with referrals and resources, Increase social support, Increase ability to appropriately verbalize feelings, Increase emotional regulation and Increase skills for wellness and recovery  Therapeutic Interventions: Assess for all discharge needs, 1 to 1 time with  Social worker, Explore available resources and support systems, Assess for adequacy in community support network, Educate family  and significant other(s) on suicide prevention, Complete Psychosocial Assessment, Interpersonal group therapy.  Evaluation of Outcomes: Progressing   Progress in Treatment: Attending groups: Yes. Participating in groups: Yes. Taking medication as prescribed: Yes. Toleration medication: Yes. Family/Significant other contact made: Yes, individual(s) contacted:  [place], brother  Patient understands diagnosis: Yes. Discussing patient identified problems/goals with staff: Yes. Medical problems stabilized or resolved: Yes. Denies suicidal/homicidal ideation: Yes. Issues/concerns per patient self-inventory: No. Other: N/a  New problem(s) identified: No, Describe:  None   New Short Term/Long Term Goal(s): Elimination of symptoms of psychosis, medication management for mood stabilization; elimination of SI thoughts; development of comprehensive mental wellness/sobriety plan.Update 03/01/20:No changes at this time. 03/07/20: Update, no changes at this time Update 03/12/2020: No changes at this time.Update 03/17/2020:No changes at this time. Update 03/21/20: No changes at this time. Update 03/26/20:  No changes at this time. Update 03/31/2020:  No changes at this time. Update 04/04/20: No changes at this time    Patient Goals:  Patient stated he would like to go back to [place] and reconnect with his peer support. Patient also stated that he would like to increase his social support and help his brother.Update 03/07/20,no change Update 03/12/2020: No changes at this time. Update 03/17/2020:No changes at this time.Update 03/21/20: No changes at this time. Update 03/26/20:  No changes at this time. Update 03/31/2020:  No changes at this time. Update 04/04/20: No changes at this time  Discharge Plan or Barriers: Patient has an interview with a group home on 03/09/20 at 2:00 PM.  Update 03/12/2020: CSW continues to assist the patient in developing a housing plan.Update 03/17/2020:CSW has assisted patient in completing an interview with a group home, however, there has been no word on if the patient has been approved. Patient continues to struggle with his anxiety and nerves. Nurses report that he has increased in bed wetting. Psychiatrist and nurses believe that this may be related to his increased anxiety about finding a home. Update 03/21/20: Patient is still nervous and anxious about finding a new placement. Patient stated he could not stay here too much longer and wanted his life back. Update 03/26/20: CSW continues to look for placement for pt.  Group Homes, [place] are being considered. Update 03/31/2020:  Patient continues to have interview with group homes, however, no success at this time in identifying a home that has accepted him. Update: 04/04/20: No changes at this time   Reason for Continuation of Hospitalization: Anxiety Depression Medication stabilization  Estimated Length of Stay: TBD  Attendees: Patient: 04/04/2020 11:58 AM  Physician: Dr. Daneil Dolin, MD 04/04/2020 11:58 AM  Nursing:  04/04/2020 11:58 AM  RN Care Manager: 04/04/2020 11:58 AM  Social Worker: [name], [place] 04/04/2020 11:58 AM  Recreational Therapist:  04/04/2020 11:58 AM  Other:  04/04/2020 11:58 AM  Other:  04/04/2020 11:58 AM  Other: 04/04/2020 11:58 AM    Scribe for Treatment Team: [name], [place] 04/04/2020 11:58 AM

## 2020-04-04 NOTE — Plan of Care (Signed)
  Problem: Education: Goal: Knowledge of [facility] Education information/materials will improve Outcome: Progressing Goal: Emotional status will improve Outcome: Progressing Goal: Mental status will improve Outcome: Progressing Goal: Verbalization of understanding the information provided will improve Outcome: Progressing   

## 2020-04-04 NOTE — Progress Notes (Signed)
Patient has continued to be nervous and fixated on getting discharged. He is working on a letter to advocate for himself to try to help himself find placement. He is concerned that his behaviors are keeping him from being placed. Denies SI, HI and AVH

## 2020-04-04 NOTE — Plan of Care (Signed)
Patient has been out in the milieu. Currently in the dayroom and involved in different activities. Alert and oriented x4. Denying thoughts of self harm. Denying hallucinations. Reports that he slept well. Appetite is good. Patient has no concerns to report. Received AM medications and was encouraged to express his feelings as needed. Emotional support provided. Safety monitored as expected.

## 2020-04-05 LAB — CBC WITH DIFFERENTIAL/PLATELET
Abs Immature Granulocytes: 0.06 10*3/uL (ref 0.00–0.07)
Basophils Absolute: 0 10*3/uL (ref 0.0–0.1)
Basophils Relative: 0 %
Eosinophils Absolute: 0.3 10*3/uL (ref 0.0–0.5)
Eosinophils Relative: 2 %
HCT: 39.6 % (ref 39.0–52.0)
Hemoglobin: 13.2 g/dL (ref 13.0–17.0)
Immature Granulocytes: 1 %
Lymphocytes Relative: 9 %
Lymphs Abs: 1 10*3/uL (ref 0.7–4.0)
MCH: 31.7 pg (ref 26.0–34.0)
MCHC: 33.3 g/dL (ref 30.0–36.0)
MCV: 95 fL (ref 80.0–100.0)
Monocytes Absolute: 1.2 10*3/uL — ABNORMAL HIGH (ref 0.1–1.0)
Monocytes Relative: 11 %
Neutro Abs: 8.3 10*3/uL — ABNORMAL HIGH (ref 1.7–7.7)
Neutrophils Relative %: 77 %
Platelets: 160 10*3/uL (ref 150–400)
RBC: 4.17 MIL/uL — ABNORMAL LOW (ref 4.22–5.81)
RDW: 11.9 % (ref 11.5–15.5)
WBC: 10.7 10*3/uL — ABNORMAL HIGH (ref 4.0–10.5)
nRBC: 0 % (ref 0.0–0.2)

## 2020-04-05 LAB — COMPREHENSIVE METABOLIC PANEL
ALT: 9 U/L (ref 0–44)
AST: 16 U/L (ref 15–41)
Albumin: 3.7 g/dL (ref 3.5–5.0)
Alkaline Phosphatase: 80 U/L (ref 38–126)
Anion gap: 9 (ref 5–15)
BUN: 18 mg/dL (ref 8–23)
CO2: 25 mmol/L (ref 22–32)
Calcium: 8.8 mg/dL — ABNORMAL LOW (ref 8.9–10.3)
Chloride: 103 mmol/L (ref 98–111)
Creatinine, Ser: 0.96 mg/dL (ref 0.61–1.24)
GFR calc Af Amer: >60 mL/min (ref >60)
GFR calc non Af Amer: >60 mL/min (ref >60)
Glucose, Bld: 143 mg/dL — ABNORMAL HIGH (ref 70–99)
Potassium: 4.4 mmol/L (ref 3.5–5.1)
Sodium: 137 mmol/L (ref 135–145)
Total Bilirubin: 0.6 mg/dL (ref 0.3–1.2)
Total Protein: 6.8 g/dL (ref 6.5–8.1)

## 2020-04-05 MED ORDER — LOPERAMIDE HCL 2 MG PO CAPS
2.0000 mg | ORAL_CAPSULE | ORAL | Status: DC | PRN
  Administered 2020-04-10: 2 mg via ORAL
  Filled 2020-04-05: qty 1

## 2020-04-05 NOTE — Progress Notes (Signed)
Alexander Beard continues to worry about placement saying that nobody seems to want him. Was encouraged to discuss placement issues with case managements. Patient enjoyed going outside for fresh air: played basketball with peers. Returned to the unit and had dinner and medications. No additional concerns.

## 2020-04-05 NOTE — [facility] Group Notes (Signed)
[facility] LCSW Group Therapy Note  Date/Time:  04/05/2020 1:24 PM- 2:00 PM   Type of Therapy and Topic:  Group Therapy:  Healthy and Unhealthy Supports  Participation Level:  Active   Description of Group:  Patients in this group were introduced to the idea of adding a variety of healthy supports to address the various needs in their lives.Patients discussed what additional healthy supports could be helpful in their recovery and wellness after discharge in order to prevent future hospitalizations.   An emphasis was placed on using counselor, doctor, therapy groups, 12-step groups, and problem-specific support groups to expand supports.  They also worked as a group on developing a specific plan for several patients to deal with unhealthy supports through [place], psychoeducation with loved ones, and even termination of relationships.   Therapeutic Goals:   1)  discuss importance of adding supports to stay well once out of the hospital  2)  compare healthy versus unhealthy supports and identify some examples of each  3)  generate ideas and descriptions of healthy supports that can be added  4)  offer mutual support about how to address unhealthy supports  5)  encourage active participation in and adherence to discharge plan    Summary of Patient Progress:  Patient checked into group feeling anxious about discharging from the hospital. Patient stated he would like to continue to have his brother as a support to him. Patient talked about an old therapist that was an unhealthy support to him. Patient stated this person had told his mother that she was not a good parent. 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    Therapeutic Modalities:   Motivational Interviewing Brief Solution-Focused Therapy  [name], [place]anya [place] 04/05/2020  3:32 PM

## 2020-04-05 NOTE — Plan of Care (Signed)
Patient currently out of bed and active in the milieu per baseline. Earlier this morning, patient complained of loose stools x2 and malaise. Requested a ginger aile then reported feeling better a few hours later. No additional concerns.

## 2020-04-05 NOTE — Progress Notes (Signed)
[facility] MD Progress Note  04/05/2020 10:03 AM Alexander Beard  MRN:  [id number] Subjective:  04/05/2020 Patient shares that he has had bouts of diarrhea this morning, 5 times, and that it started today.  He denies abdominal pain or feeling feverish.  His temperature is afebrile.  Laments the fact that he was not able to go outside or interact with the nursing students.  Asks if I can relay if this can happen today to staff and nursing.  Says that he particular likes to play corn hole and select his own songs when listening to music.  He did sleep well.  Reports moderate anxiety this morning.  Denies safety concerns.   04/04/2020 Alexander Beard is being seen for the first time today.  He was seen for psychiatric evaluation on February 20, 2020.    He was seen laying in his bed.  Tells me that staying in bed in the morning helps him feel better physically.  No new medical issues.  Emotionally, says that he has "some of both," good and sad moments.  There is that the highlight of his day is spending time with the nursing students and the ability to go outside.  Says that being stuck inside worsens his mood.  Feels that his medications are working.  States that he slept fair last night.  Tolerating his medications.  Denies have any safety concerns today.  Denies symptoms of psychosis.   Principal Problem: Schizoaffective disorder, bipolar type ([place]) Diagnosis: Principal Problem:   Schizoaffective disorder, bipolar type ([place]) Active Problems:   Active autistic disorder   Diabetes ([place])   Hypertension   Prostate hypertrophy   Intellectual disability   At risk for elopement  Total Time spent with patient: 27 minutes.   Past Psychiatric History: Past history of longstanding disability  Past Medical History:  Past Medical History:  Diagnosis Date   Anxiety    Anxiety disorder due to known physiological condition    HOSPITALIZED 10/18   Arthritis    Autistic disorder, residual state     COPD (chronic obstructive pulmonary disease) (HCC)    Depression    Developmental disorder    Diabetes mellitus without complication (HCC)    Dyslipidemia    Esophageal reflux    HOH (hard of hearing)    MILDLY   Hypertension    Obesity    Overactive bladder    Palpitations    ANXIETY   Schizophrenia, schizoaffective (HCC)    Sleep apnea    Tremors of nervous system    HANDS DUE TO MEDICATIONS   Urinary incontinence     Past Surgical History:  Procedure Laterality Date   CATARACT EXTRACTION W/PHACO Left 11/14/2017   Procedure: CATARACT EXTRACTION PHACO AND INTRAOCULAR LENS PLACEMENT ([place]);  Surgeon: Birder Robson, MD;  Location: [facility] ORS;  Service: Ophthalmology;  Laterality: Left;  [place] 00:42 AP% 14.6 CDE 6.12 Fluid pack lot # [id number]   COLONOSCOPY  01/28/2005   COLONOSCOPY WITH PROPOFOL N/A 05/09/2016   Procedure: COLONOSCOPY WITH PROPOFOL;  Surgeon: Manya Silvas, MD;  Location: [facility] END[facility]COPY;  Service: Endoscopy;  Laterality: N/A;   ESOPHAGOGASTRODUODENOSCOPY N/A 05/09/2016   Procedure: ESOPHAGOGASTRODUODENOSCOPY (EGD);  Surgeon: Manya Silvas, MD;  Location: [facility] ENDOSCOPY;  Service: Endoscopy;  Laterality: N/A;   FRACTURE SURGERY     ORIF SHOULDER   TONSILLECTOMY     Family History:  Family History  Problem Relation Age of Onset   Hypertension Mother    Stroke Father  Heart Problems Father    Family Psychiatric  History: See previous Social History:  Social History   Substance and Sexual Activity  Alcohol Use No   Alcohol/week: 0.0 standard drinks     Social History   Substance and Sexual Activity  Drug Use No    Social History   Socioeconomic History   Marital status: Single    Spouse name: Not on file   Number of children: Not on file   Years of education: Not on file   Highest education level: Not on file  Occupational History   Not on file  Tobacco Use   Smoking status: Never Smoker   Smokeless  tobacco: Never Used  Vaping Use   Vaping Use: Never used  Substance and Sexual Activity   Alcohol use: No    Alcohol/week: 0.0 standard drinks   Drug use: No   Sexual activity: Never  Other Topics Concern   Not on file  Social History Narrative   The patient never finished high school but did get his GED. 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 He is currently in disability and [name] is his legal guardian. He has been living in group homes for many years.      No pending legal charges   Social Determinants of Health   Financial Resource Strain:    Difficulty of Paying Living Expenses: Not on file  Food Insecurity:    Worried About [place] in the Last Year: Not on file   Ran Out of Food in the Last Year: Not on file  Transportation Needs:    Lack of Transportation (Medical): Not on file   Lack of Transportation (Non-Medical): Not on file  Physical Activity:    Days of Exercise per Week: Not on file   Minutes of Exercise per Session: Not on file  Stress:    Feeling of Stress : Not on file  Social Connections:    Frequency of Communication with Friends and Family: Not on file   Frequency of Social Gatherings with Friends and Family: Not on file   Attends Religious Services: Not on file   Active Member of Clubs or Organizations: Not on file   Attends Archivist Meetings: Not on file   Marital Status: Not on file   Additional Social History:                         Sleep: Fair  Appetite:  Fair  Current Medications: Current Facility-Administered Medications  Medication Dose Route Frequency Provider Last Rate Last Admin   acetaminophen (TYLENOL) tablet 650 mg  650 mg Oral Q6H PRN [name], MD       alum & mag hydroxide-simeth (MAALOX/MYLANTA) 200-200-20 MG/5ML suspension 30 mL  30 mL Oral Q4H PRN [name], MD       aspirin EC tablet  81 mg  81 mg Oral Daily [name], [name], MD   81 mg at 04/05/20 0757   clonazePAM (KLONOPIN) tablet 1 mg  1 mg Oral QID [name], [name], MD   1 mg at 04/05/20 0757   docusate sodium (COLACE) capsule 100 mg  100 mg Oral BID [name], [name], MD   100 mg at 04/05/20 0757   gabapentin (NEURONTIN) capsule 100 mg  100 mg Oral TID [name], [name], MD   100 mg at 04/05/20 [phone]  hydrOXYzine (ATARAX/VISTARIL) tablet 10 mg  10 mg Oral TID PRN [name], MD   10 mg at 03/30/20 2107   influenza vac split quadrivalent PF (FLUARIX) injection 0.5 mL  0.5 mL Intramuscular Tomorrow-1000 [name], [name], MD       lamoTRIgine (LAMICTAL) tablet 100 mg  100 mg Oral QHS [name], [name], MD   100 mg at 04/04/20 2123   linagliptin (TRADJENTA) tablet 5 mg  5 mg Oral Daily [name], [name], MD   5 mg at 04/05/20 0756   loratadine (CLARITIN) tablet 10 mg  10 mg Oral Daily [name], [name], MD   10 mg at 04/05/20 0757   LORazepam (ATIVAN) tablet 2 mg  2 mg Oral Q6H PRN [name], [name], MD   2 mg at 04/03/20 1154   magnesium hydroxide (MILK OF MAGNESIA) suspension 30 mL  30 mL Oral Daily PRN [name], MD   30 mL at 03/23/20 1344   metFORMIN (GLUCOPHAGE) tablet 850 mg  850 mg Oral BID WC [name], [name], MD   850 mg at 04/05/20 0756   metoprolol succinate (TOPROL-XL) 24 hr tablet 50 mg  50 mg Oral Daily [name], [name], MD   50 mg at 04/05/20 0757   ondansetron (ZOFRAN) tablet 4 mg  4 mg Oral Q8H PRN [name], [name], MD   4 mg at 04/03/20 0909   oxybutynin (DITROPAN) tablet 10 mg  10 mg Oral BID [name], [name], MD   10 mg at 04/05/20 0756   pantoprazole (PROTONIX) EC tablet 40 mg  40 mg Oral Daily [name], [name], MD   40 mg at 04/05/20 0756   prazosin (MINIPRESS) capsule 1 mg  1 mg Oral QHS [name], [name], MD   1 mg at 03/27/20 2156   QUEtiapine (SEROQUEL) tablet 100 mg  100 mg Oral QID [name], [name], MD   100 mg at 04/05/20 0756   QUEtiapine (SEROQUEL) tablet 400 mg  400 mg Oral QHS  [name], NP   400 mg at 04/04/20 2124   sertraline (ZOLOFT) tablet 100 mg  100 mg Oral BID [name], [name], MD   100 mg at 04/05/20 0757   simethicone (MYLICON) chewable tablet 80 mg  80 mg Oral TID [name], [name], MD   80 mg at 04/05/20 0756   simvastatin (ZOCOR) tablet 20 mg  20 mg Oral q1800 [name], [name], MD   20 mg at 04/04/20 1723   tamsulosin (FLOMAX) capsule 0.8 mg  0.8 mg Oral QPC supper [name], [name], MD   0.8 mg at 04/04/20 1723    Lab Results: No results found for this or any previous visit (from the past 48 hour(s)).  Blood Alcohol level:  Lab Results  Component Value Date   ETH <10 01/22/2020   ETH <10 60/63/0160    Metabolic Disorder Labs: Lab Results  Component Value Date   HGBA1C 6.6 (H) 02/20/2020   MPG 142.72 02/20/2020   No results found for: PROLACTIN Lab Results  Component Value Date   CHOL 153 04/10/2015   TRIG 166 (H) 04/10/2015   HDL 50 04/10/2015   CHOLHDL 3.1 04/10/2015   VLDL 33 04/10/2015   LDLCALC 70 04/10/2015    Physical Findings: AIMS:  , ,  ,  ,    CIWA:    COWS:     Musculoskeletal: Strength & Muscle Tone: within normal limits Gait & Station: normal Patient leans: N/A  Psychiatric Specialty Exam:  Review of Systems  Constitutional: Negative.   HENT: Negative.   Eyes: Negative.   Respiratory: Negative.   Cardiovascular: Negative.   Gastrointestinal: Negative.   Musculoskeletal: Negative.   Skin: Negative.   Neurological: Negative.   Psychiatric/Behavioral: The patient is not nervous/anxious.     Blood pressure 108/72, pulse 61, temperature 98.1 F (36.7 C), temperature source Oral, resp. rate 18, height 5\' 8"  (1.727 m), weight 104.3 kg, SpO2 99 %.Body mass index is 34.96 kg/m.  General Appearance: Casual, Cooperateive, has his eyes closed  Eye Contact:  Good  Speech:    Volume:  Normal  Mood:  Euthymic  Affect:  anxious  Thought Process:  Goal Directed  Orientation:  Full (Time, Place, and  Person)  Thought Content:  Logical  Suicidal Thoughts:  No  Homicidal Thoughts:  No  Memory:  Immediate;   Fair Recent;   Fair Remote;   Fair  Judgement:  Fair  Insight:  Fair  Psychomotor Activity:  Normal  Concentration:  Concentration: Fair  Recall:  [employer] of Knowledge:  Fair  Language:  Fair  Akathisia:  No  Handed:  Right  AIMS (if indicated):     Assets:  Desire for Improvement  ADL's:  Intact  Cognition:  Impaired,  Mild  Sleep:  Number of Hours: 5     Treatment Plan Summary: Plan No change to medication.  Supportive therapy as always.  And encouragement in his behavior.  Still working on discharge   04/04/2020 No changes  04/05/20 Monitor symptoms of Diarrhea Obtain CBC and CMP Patient is afebrile Add as needed Imodium If does not improve, will obtain C. difficile assay  [name], MD 04/05/2020, 10:03 AM

## 2020-04-06 DIAGNOSIS — F250 Schizoaffective disorder, bipolar type: Secondary | ICD-10-CM | POA: Diagnosis not present

## 2020-04-06 NOTE — Progress Notes (Signed)
Patient noted arguing and yelling at staff at the beginning of shift. Angry due to not being able to go outside more than one time today. Pt later calmed down. Denies any SI, HI, AVH. Noted in dayroom writing and coloring in no distress. Medications given as prescribed and taken. Encouragement and support provided. Safety checks maintained. Medications given as prescribed. Pt receptive and remains safe on unit with q 15 min checks.

## 2020-04-06 NOTE — Plan of Care (Signed)
  Problem: Education: Goal: Emotional status will improve Outcome: Progressing Goal: Mental status will improve Outcome: Progressing   Problem: Activity: Goal: Interest or engagement in activities will improve Outcome: Progressing   Problem: Coping: Goal: Ability to demonstrate self-control will improve Outcome: Progressing   

## 2020-04-06 NOTE — Progress Notes (Signed)
Recreation Therapy Notes   Date: 04/06/2020  Time: 9:30 am   Location: [facility] [place]   Behavioral response: Appropriate  Intervention Topic: Social Skills   Discussion/Intervention:  Group content on today was focused on social skills. The group defined social skills and identified ways they use social skills. Patients expressed what obstacles they face when trying to be social. Participants described the importance of social skills. The group listed ways to improve social skills and reasons to improve social skills. Individuals had an opportunity to learn new and improve social skills as well as identify their weaknesses.  Clinical Observations/Feedback: Patient came to group and was able to focus and expresses their thoughts in a positive social manner. Individual was social with peers and staff while participating in the intervention.  Trevone Prestwood LRT/CTRS         Tuana Hoheisel 04/06/2020 12:18 PM

## 2020-04-06 NOTE — Progress Notes (Signed)
[facility] MD Progress Note  04/06/2020 1:46 PM Alexander Beard  MRN:  [id number] Subjective: Follow-up for 64 year old man with chronic disability. Patient as is often the case wanted to talk today at some length about how disappointed he was not not getting specific services on the unit. Emotionally anxious and agitated. However he is not showing any major behavior problems not intrusive not showing any acute psychosis. Nurses report physically he has been having more frequent loose stools but not incontinence. Principal Problem: Schizoaffective disorder, bipolar type ([place]) Diagnosis: Principal Problem:   Schizoaffective disorder, bipolar type ([place]) Active Problems:   Active autistic disorder   Diabetes ([place])   Hypertension   Prostate hypertrophy   Intellectual disability   At risk for elopement  Total Time spent with patient: 30 minutes  Past Psychiatric History: Longstanding chronic disability  Past Medical History:  Past Medical History:  Diagnosis Date  . Anxiety   . Anxiety disorder due to known physiological condition    HOSPITALIZED 10/18  . Arthritis   . Autistic disorder, residual state   . COPD (chronic obstructive pulmonary disease) ([place])   . Depression   . Developmental disorder   . Diabetes mellitus without complication ([place])   . Dyslipidemia   . Esophageal reflux   . HOH (hard of hearing)    MILDLY  . Hypertension   . Obesity   . Overactive bladder   . Palpitations    ANXIETY  . Schizophrenia, schizoaffective ([place])   . Sleep apnea   . Tremors of nervous system    HANDS DUE TO MEDICATIONS  . Urinary incontinence     Past Surgical History:  Procedure Laterality Date  . CATARACT EXTRACTION W/PHACO Left 11/14/2017   Procedure: CATARACT EXTRACTION PHACO AND INTRAOCULAR LENS PLACEMENT (IOC);  Surgeon: Birder Robson, MD;  Location: [facility] ORS;  Service: Ophthalmology;  Laterality: Left;  [place] 00:42 AP% 14.6 CDE 6.12 Fluid pack lot # [id number]  . COLONOSCOPY   01/28/2005  . COLONOSCOPY WITH PROPOFOL N/A 05/09/2016   Procedure: COLONOSCOPY WITH PROPOFOL;  Surgeon: Manya Silvas, MD;  Location: [facility] ENDOSCOPY;  Service: Endoscopy;  Laterality: N/A;  . ESOPHAGOGASTRODUODENOSCOPY N/A 05/09/2016   Procedure: ESOPHAGOGASTRODUODENOSCOPY (EGD);  Surgeon: Manya Silvas, MD;  Location: [facility] ENDOSCOPY;  Service: Endoscopy;  Laterality: N/A;  . FRACTURE SURGERY     ORIF SHOULDER  . TONSILLECTOMY     Family History:  Family History  Problem Relation Age of Onset  . Hypertension Mother   . Stroke Father   . Heart Problems Father    Family Psychiatric  History: None known Social History:  Social History   Substance and Sexual Activity  Alcohol Use No  . Alcohol/week: 0.0 standard drinks     Social History   Substance and Sexual Activity  Drug Use No    Social History   Socioeconomic History  . Marital status: Single    Spouse name: Not on file  . Number of children: Not on file  . Years of education: Not on file  . Highest education level: Not on file  Occupational History  . Not on file  Tobacco Use  . Smoking status: Never Smoker  . Smokeless tobacco: Never Used  Vaping Use  . Vaping Use: Never used  Substance and Sexual Activity  . Alcohol use: No    Alcohol/week: 0.0 standard drinks  . Drug use: No  . Sexual activity: Never  Other Topics Concern  . Not on file  Social History Narrative  The patient never finished high school but did get his GED. 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 He is currently in disability and [name] is his legal guardian. He has been living in group homes for many years.      No pending legal charges   Social Determinants of Health   Financial Resource Strain:   . Difficulty of Paying Living Expenses: Not on file  Food Insecurity:   . Worried About Charity fundraiser in the Last Year: Not on file  . Ran Out of Food  in the Last Year: Not on file  Transportation Needs:   . Lack of Transportation (Medical): Not on file  . Lack of Transportation (Non-Medical): Not on file  Physical Activity:   . Days of Exercise per Week: Not on file  . Minutes of Exercise per Session: Not on file  Stress:   . Feeling of Stress : Not on file  Social Connections:   . Frequency of Communication with Friends and Family: Not on file  . Frequency of Social Gatherings with Friends and Family: Not on file  . Attends Religious Services: Not on file  . Active Member of Clubs or Organizations: Not on file  . Attends Archivist Meetings: Not on file  . Marital Status: Not on file   Additional Social History:                         Sleep: Fair  Appetite:  Fair  Current Medications: Current Facility-Administered Medications  Medication Dose Route Frequency Provider Last Rate Last Admin  . acetaminophen (TYLENOL) tablet 650 mg  650 mg Oral Q6H PRN [name], MD      . alum & mag hydroxide-simeth (MAALOX/MYLANTA) 200-200-20 MG/5ML suspension 30 mL  30 mL Oral Q4H PRN [name], MD      . aspirin EC tablet 81 mg  81 mg Oral Daily Fatou Dunnigan, [name], MD   81 mg at 04/06/20 0847  . clonazePAM (KLONOPIN) tablet 1 mg  1 mg Oral QID Zephaniah Lubrano T, MD   1 mg at 04/06/20 1200  . gabapentin (NEURONTIN) capsule 100 mg  100 mg Oral TID Londell Noll T, MD   100 mg at 04/06/20 1200  . hydrOXYzine (ATARAX/VISTARIL) tablet 10 mg  10 mg Oral TID PRN [name], MD   10 mg at 03/30/20 2107  . influenza vac split quadrivalent PF (FLUARIX) injection 0.5 mL  0.5 mL Intramuscular Tomorrow-1000 Valli Randol T, MD      . lamoTRIgine (LAMICTAL) tablet 100 mg  100 mg Oral QHS Dhrithi Riche T, MD   100 mg at 04/05/20 2125  . linagliptin (TRADJENTA) tablet 5 mg  5 mg Oral Daily Janiesha Diehl, [name], MD   5 mg at 04/06/20 0847  . loperamide (IMODIUM) capsule 2 mg  2 mg Oral Q4H PRN [name], MD      . loratadine  (CLARITIN) tablet 10 mg  10 mg Oral Daily Aniqa Hare, [name], MD   10 mg at 04/06/20 0847  . LORazepam (ATIVAN) tablet 2 mg  2 mg Oral Q6H PRN Jonathan Corpus, [name], MD   2 mg at 04/06/20 1200  . magnesium hydroxide (MILK OF MAGNESIA) suspension 30 mL  30 mL Oral Daily PRN [name], MD   30 mL at 03/23/20 1344  . metFORMIN (GLUCOPHAGE) tablet 850 mg  850 mg Oral BID WC Iosefa Weintraub, [name], MD   850 mg at 04/06/20 0847  . metoprolol succinate (TOPROL-XL) 24 hr tablet 50 mg  50 mg Oral Daily Kaylen Nghiem, [name], MD   50 mg at 04/06/20 0846  . ondansetron (ZOFRAN) tablet 4 mg  4 mg Oral Q8H PRN Ikhlas Albo, [name], MD   4 mg at 04/03/20 0909  . oxybutynin (DITROPAN) tablet 10 mg  10 mg Oral BID Manreet Kiernan, [name], MD   10 mg at 04/06/20 0847  . pantoprazole (PROTONIX) EC tablet 40 mg  40 mg Oral Daily Burak Zerbe, [name], MD   40 mg at 04/06/20 0846  . prazosin (MINIPRESS) capsule 1 mg  1 mg Oral QHS Mayan Dolney, [name], MD   1 mg at 03/27/20 2156  . QUEtiapine (SEROQUEL) tablet 100 mg  100 mg Oral QID Melody Cirrincione T, MD   100 mg at 04/06/20 1200  . QUEtiapine (SEROQUEL) tablet 400 mg  400 mg Oral QHS [name], NP   400 mg at 04/05/20 2125  . sertraline (ZOLOFT) tablet 100 mg  100 mg Oral BID Erby Sanderson, [name], MD   100 mg at 04/06/20 0847  . simethicone (MYLICON) chewable tablet 80 mg  80 mg Oral TID Noma Quijas, [name], MD   80 mg at 04/06/20 1200  . simvastatin (ZOCOR) tablet 20 mg  20 mg Oral q1800 Franceen Erisman, [name], MD   20 mg at 04/05/20 1702  . tamsulosin (FLOMAX) capsule 0.8 mg  0.8 mg Oral QPC supper Mackenzie Lia, [name], MD   0.8 mg at 04/05/20 1701    Lab Results:  Results for orders placed or performed during the hospital encounter of 02/19/20 (from the past 48 hour(s))  CBC with Differential/Platelet     Status: Abnormal   Collection Time: 04/05/20 10:20 AM  Result Value Ref Range   WBC 10.7 (H) 4.0 - 10.5 K/uL   RBC 4.17 (L) 4.22 - 5.81 MIL/uL   Hemoglobin 13.2 13.0 - 17.0 g/dL   HCT 39.6 39 - 52 %    MCV 95.0 80.0 - 100.0 fL   MCH 31.7 26.0 - 34.0 pg   MCHC 33.3 30.0 - 36.0 g/dL   RDW 11.9 11.5 - 15.5 %   Platelets 160 150 - 400 K/uL   nRBC 0.0 0.0 - 0.2 %   Neutrophils Relative % 77 %   Neutro Abs 8.3 (H) 1.7 - 7.7 K/uL   Lymphocytes Relative 9 %   Lymphs Abs 1.0 0.7 - 4.0 K/uL   Monocytes Relative 11 %   Monocytes Absolute 1.2 (H) 0 - 1 K/uL   Eosinophils Relative 2 %   Eosinophils Absolute 0.3 0 - 0 K/uL   Basophils Relative 0 %   Basophils Absolute 0.0 0 - 0 K/uL   Immature Granulocytes 1 %   Abs Immature Granulocytes 0.06 0.00 - 0.07 K/uL    Comment: Performed at [facility], [address] [place], [place] [address]  Comprehensive metabolic panel     Status: Abnormal   Collection Time: 04/05/20 10:20 AM  Result Value Ref Range   Sodium 137 135 - 145 mmol/L   Potassium 4.4 3.5 - 5.1 mmol/L   Chloride 103 98 - 111 mmol/L   CO2 25 22 - 32 mmol/L   Glucose, Bld 143 (H) 70 - 99 mg/dL    Comment: Glucose reference range applies only to samples taken after fasting for at least 8 hours.   BUN 18  8 - 23 mg/dL   Creatinine, Ser 0.96 0.61 - 1.24 mg/dL   Calcium 8.8 (L) 8.9 - 10.3 mg/dL   Total Protein 6.8 6.5 - 8.1 g/dL   Albumin 3.7 3.5 - 5.0 g/dL   AST 16 15 - 41 U/L   ALT 9 0 - 44 U/L   Alkaline Phosphatase 80 38 - 126 U/L   Total Bilirubin 0.6 0.3 - 1.2 mg/dL   GFR calc non Af Amer >60 >60 mL/min   GFR calc Af Amer >60 >60 mL/min   Anion gap 9 5 - 15    Comment: Performed at [facility], [address] [place], [place] [address]    Blood Alcohol level:  Lab Results  Component Value Date   [facility] <10 01/22/2020   ETH <10 85/46/2703    Metabolic Disorder Labs: Lab Results  Component Value Date   HGBA1C 6.6 (H) 02/20/2020   MPG 142.72 02/20/2020   No results found for: PROLACTIN Lab Results  Component Value Date   CHOL 153 04/10/2015   TRIG 166 (H) 04/10/2015   HDL 50 04/10/2015   CHOLHDL 3.1 04/10/2015   VLDL 33 04/10/2015    LDLCALC 70 04/10/2015    Physical Findings: AIMS:  , ,  ,  ,    CIWA:    COWS:     Musculoskeletal: Strength & Muscle Tone: within normal limits Gait & Station: normal Patient leans: N/A  Psychiatric Specialty Exam: Physical Exam Vitals and nursing note reviewed.  Constitutional:      Appearance: He is well-developed.  HENT:     Head: Normocephalic and atraumatic.  Eyes:     Conjunctiva/sclera: Conjunctivae normal.     Pupils: Pupils are equal, round, and reactive to light.  Cardiovascular:     Heart sounds: Normal heart sounds.  Pulmonary:     Effort: Pulmonary effort is normal.  Abdominal:     Palpations: Abdomen is soft.  Musculoskeletal:        General: Normal range of motion.     Cervical back: Normal range of motion.  Skin:    General: Skin is warm and dry.  Neurological:     General: No focal deficit present.     Mental Status: He is alert.  Psychiatric:        Attention and Perception: Attention normal.        Mood and Affect: Mood is anxious.        Speech: Speech normal.        Behavior: Behavior normal.        Thought Content: Thought content normal.        Cognition and Memory: Cognition is impaired.        Judgment: Judgment is impulsive.     Review of Systems  Constitutional: Negative.   HENT: Negative.   Eyes: Negative.   Respiratory: Negative.   Cardiovascular: Negative.   Gastrointestinal: Negative.   Musculoskeletal: Negative.   Skin: Negative.   Neurological: Negative.   Psychiatric/Behavioral: Positive for agitation. Negative for hallucinations and suicidal ideas. The patient is nervous/anxious.     Blood pressure 134/82, pulse 61, temperature (!) 97.5 F (36.4 C), temperature source Oral, resp. rate 18, height 5\' 8"  (1.727 m), weight 104.3 kg, SpO2 99 %.Body mass index is 34.96 kg/m.  General Appearance: Casual  Eye Contact:  Good  Speech:  Normal Rate  Volume:  Increased  Mood:  Anxious  Affect:  Constricted  Thought Process:   Disorganized  Orientation:  Full (Time, Place, and Person)  Thought Content:  Rumination and Tangential  Suicidal Thoughts:  No  Homicidal Thoughts:  No  Memory:  Immediate;   Fair Recent;   Fair Remote;   Fair  Judgement:  Fair  Insight:  Fair  Psychomotor Activity:  Normal  Concentration:  Concentration: Fair  Recall:  [employer] of Knowledge:  Fair  Language:  Fair  Akathisia:  No  Handed:  Right  AIMS (if indicated):     Assets:  Desire for Improvement Resilience Social Support  ADL's:  Impaired  Cognition:  Impaired,  Mild  Sleep:  Number of Hours: 6.75     Treatment Plan Summary: Daily contact with patient to assess and evaluate symptoms and progress in treatment, Medication management and Plan 64 year old man with chronic disability. Basic mental status unchanged. He gets some benefit from ventilating about things as long as he is validated and not depressed to change. For the most part he is stable here on the ward. Not suicidal not homicidal not actively psychotic. Still awaiting some kind of placement.  Alethia Berthold, MD 04/06/2020, 1:46 PM

## 2020-04-06 NOTE — Progress Notes (Signed)
D: Pt alert and oriented. Pt rates depression 0/10, hopelessness 0/10, and anxiety 0/10. Pt denies experiencing any pain at this time. Pt denies experiencing any SI/HI, or AVH at this time.   Pt reports being upset about not going outside consistently everyday. Pt also states that when they go outside he doesn't always get to play basket ball, and/or corn hole, and listen to music and thinks that those items should always be brought out, that it's not right. This Probation officer explained that we always try to take the pt's out, however the weather and staffing does not always permit this to happen. This Probation officer also explained that each MHT and RN are different and have different rules, that there is always a reason behind something being done or not done. Pt was not excepting of this explanation and attempted to be argumentative. This Probation officer sat a limit and shutdown any inappropriateness.   A: Scheduled medications administered to pt, per MD orders. Support and encouragement provided. Frequent verbal contact made. Routine safety checks conducted q15 minutes.   R: No adverse drug reactions noted. Pt verbally contracts for safety at this time. Pt complaint with medications. Pt interacts well with others on the unit. Pt remains safe at this time. Will continue to monitor.

## 2020-04-06 NOTE — Progress Notes (Signed)
Pt voiced anxiety to another RN. This writer went to get pt from common area and asked him if he would like some medications for anxiety. The pt answered yes. This Probation officer stated come with me and I will give you some medication to help with you anxiety. MHT as the pt got up to follow this writer stated to the pt what do you have anxiety about, you've been sitting here writing. This is when the pt became loud and frustrated and began raising his voice and pointing his finger at this Probation officer. This Probation officer stated to the pt to calm down and that he shouldn't speak to people that way, it was not okay and that he would not be allowed to speak to this Probation officer like that. This Probation officer stated it was quiet enough and to please go to his room and wait for me to bring the medication. The pt stated it was not enough and just fine I'll wait in my room then. The pt continued to shout and state he has rights and that he's being mistreated. Pt continues to state his stay here has been disgusting and that he's going to write a letter to the higher beings. Pt is fixated on stating the MHT promised him they would go out again. The MHT did not state that. The MHT stated she could not promise she would be able to take them out again. Pt stated that he needs to go out at least two times a day that this is what they did to him at the other place keeping him cooped up and that, that's what lead him to being like this in the first place.   PRN medication given. Pt is inconsolable at this time.

## 2020-04-06 NOTE — Progress Notes (Signed)
Patient has continued to perseverate about finding placement. He is also concerned about the logistics of his niece bringing him sketchbooks in different sizes. Denies SI, HI and AVH

## 2020-04-06 NOTE — Plan of Care (Signed)
  Problem: Education: Goal: Knowledge of [facility] Education information/materials will improve Outcome: Progressing Goal: Emotional status will improve Outcome: Progressing Goal: Mental status will improve Outcome: Progressing Goal: Verbalization of understanding the information provided will improve Outcome: Progressing   Problem: Activity: Goal: Interest or engagement in activities will improve Outcome: Progressing Goal: Sleeping patterns will improve Outcome: Progressing   

## 2020-04-07 DIAGNOSIS — F250 Schizoaffective disorder, bipolar type: Secondary | ICD-10-CM | POA: Diagnosis not present

## 2020-04-07 NOTE — B[facility] Counselor (Signed)
CSW attempted to call the patient's brother.  CSW left HIPAA compliant voicemail.  Assunta Curtis, MSW, LCSW 04/07/2020 2:04 PM

## 2020-04-07 NOTE — Progress Notes (Signed)
Patient irritable this evening stating, "I am going to have to get a lawyer or go to the police, nobody is helping me here. The staff is working against me. I should be able to enjoy the things I enjoy when I get out of here. I am afraid I won't be able to watch my shows or sing at the ballpark again. I am afraid I am going to have to kill myself." Patient given support and encouragement. Patient redirected to his room. Patient compliant with medication administration per MD orders. Patient being monitored Q 15 minutes for safety per unit protocol. Pt remains safe on the unit.

## 2020-04-07 NOTE — Progress Notes (Signed)
[facility] MD Progress Note  04/07/2020 11:27 AM Alexander Beard  MRN:  [id number] Subjective: Follow-up for this patient with developmental disability and schizoaffective disorder.  Today the patient repeated his complaints about not being allowed to have enough enjoyable activities here in the hospital.  Also complains about not feeling that people are doing what they need to do to find him a place to live.  No evidence of hallucinations or psychosis.  No threats.  He is mostly staying to himself although he comes out into the day room and is working on his projects appropriately.  No acutely dangerous behavior Principal Problem: Schizoaffective disorder, bipolar type ([place]) Diagnosis: Principal Problem:   Schizoaffective disorder, bipolar type ([place]) Active Problems:   Active autistic disorder   Diabetes ([place])   Hypertension   Prostate hypertrophy   Intellectual disability   At risk for elopement  Total Time spent with patient: 30 minutes  Past Psychiatric History: Past history of longstanding developmental disability  Past Medical History:  Past Medical History:  Diagnosis Date  . Anxiety   . Anxiety disorder due to known physiological condition    HOSPITALIZED 10/18  . Arthritis   . Autistic disorder, residual state   . COPD (chronic obstructive pulmonary disease) ([place])   . Depression   . Developmental disorder   . Diabetes mellitus without complication ([place])   . Dyslipidemia   . Esophageal reflux   . HOH (hard of hearing)    MILDLY  . Hypertension   . Obesity   . Overactive bladder   . Palpitations    ANXIETY  . Schizophrenia, schizoaffective ([place])   . Sleep apnea   . Tremors of nervous system    HANDS DUE TO MEDICATIONS  . Urinary incontinence     Past Surgical History:  Procedure Laterality Date  . CATARACT EXTRACTION W/PHACO Left 11/14/2017   Procedure: CATARACT EXTRACTION PHACO AND INTRAOCULAR LENS PLACEMENT (IOC);  Surgeon: Birder Robson, MD;  Location: [facility] ORS;   Service: Ophthalmology;  Laterality: Left;  [place] 00:42 AP% 14.6 CDE 6.12 Fluid pack lot # [id number]  . COLONOSCOPY  01/28/2005  . COLONOSCOPY WITH PROPOFOL N/A 05/09/2016   Procedure: COLONOSCOPY WITH PROPOFOL;  Surgeon: Manya Silvas, MD;  Location: [facility] ENDOSCOPY;  Service: Endoscopy;  Laterality: N/A;  . ESOPHAGOGASTRODUODENOSCOPY N/A 05/09/2016   Procedure: ESOPHAGOGASTRODUODENOSCOPY (EGD);  Surgeon: Manya Silvas, MD;  Location: [facility] ENDOSCOPY;  Service: Endoscopy;  Laterality: N/A;  . FRACTURE SURGERY     ORIF SHOULDER  . TONSILLECTOMY     Family History:  Family History  Problem Relation Age of Onset  . Hypertension Mother   . Stroke Father   . Heart Problems Father    Family Psychiatric  History: See previous Social History:  Social History   Substance and Sexual Activity  Alcohol Use No  . Alcohol/week: 0.0 standard drinks     Social History   Substance and Sexual Activity  Drug Use No    Social History   Socioeconomic History  . Marital status: Single    Spouse name: Not on file  . Number of children: Not on file  . Years of education: Not on file  . Highest education level: Not on file  Occupational History  . Not on file  Tobacco Use  . Smoking status: Never Smoker  . Smokeless tobacco: Never Used  Vaping Use  . Vaping Use: Never used  Substance and Sexual Activity  . Alcohol use: No    Alcohol/week: 0.0  standard drinks  . Drug use: No  . Sexual activity: Never  Other Topics Concern  . Not on file  Social History Narrative   The patient never finished high school but did get his GED. 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 He is currently in disability and [name] is his legal guardian. He has been living in group homes for many years.      No pending legal charges   Social Determinants of Health   Financial Resource Strain:   . Difficulty of Paying Living Expenses:  Not on file  Food Insecurity:   . Worried About Charity fundraiser in the Last Year: Not on file  . Ran Out of Food in the Last Year: Not on file  Transportation Needs:   . Lack of Transportation (Medical): Not on file  . Lack of Transportation (Non-Medical): Not on file  Physical Activity:   . Days of Exercise per Week: Not on file  . Minutes of Exercise per Session: Not on file  Stress:   . Feeling of Stress : Not on file  Social Connections:   . Frequency of Communication with Friends and Family: Not on file  . Frequency of Social Gatherings with Friends and Family: Not on file  . Attends Religious Services: Not on file  . Active Member of Clubs or Organizations: Not on file  . Attends Archivist Meetings: Not on file  . Marital Status: Not on file   Additional Social History:                         Sleep: Fair  Appetite:  Fair  Current Medications: Current Facility-Administered Medications  Medication Dose Route Frequency Provider Last Rate Last Admin  . acetaminophen (TYLENOL) tablet 650 mg  650 mg Oral Q6H PRN [name], MD      . alum & mag hydroxide-simeth (MAALOX/MYLANTA) 200-200-20 MG/5ML suspension 30 mL  30 mL Oral Q4H PRN [name], MD      . aspirin EC tablet 81 mg  81 mg Oral Daily Lyvonne Cassell, [name], MD   81 mg at 04/07/20 0805  . clonazePAM (KLONOPIN) tablet 1 mg  1 mg Oral QID Marlow Hendrie, [name], MD   1 mg at 04/07/20 0805  . gabapentin (NEURONTIN) capsule 100 mg  100 mg Oral TID Shmiel Morton, [name], MD   100 mg at 04/07/20 0804  . hydrOXYzine (ATARAX/VISTARIL) tablet 10 mg  10 mg Oral TID PRN [name], MD   10 mg at 03/30/20 2107  . influenza vac split quadrivalent PF (FLUARIX) injection 0.5 mL  0.5 mL Intramuscular Tomorrow-1000 Domino Holten T, MD      . lamoTRIgine (LAMICTAL) tablet 100 mg  100 mg Oral QHS Syncere Kaminski T, MD   100 mg at 04/06/20 2106  . linagliptin (TRADJENTA) tablet 5 mg  5 mg Oral Daily Darielys Giglia, [name],  MD   5 mg at 04/07/20 0805  . loperamide (IMODIUM) capsule 2 mg  2 mg Oral Q4H PRN [name], MD      . loratadine (CLARITIN) tablet 10 mg  10 mg Oral Daily Hend Mccarrell, [name], MD   10 mg at 04/07/20 0806  . LORazepam (ATIVAN) tablet 2 mg  2 mg Oral Q6H PRN Adalee Kathan, [name], MD   2 mg at 04/06/20 1833  . magnesium hydroxide (MILK OF MAGNESIA)  suspension 30 mL  30 mL Oral Daily PRN [name], MD   30 mL at 03/23/20 1344  . metFORMIN (GLUCOPHAGE) tablet 850 mg  850 mg Oral BID WC Katara Griner, [name], MD   850 mg at 04/07/20 0807  . metoprolol succinate (TOPROL-XL) 24 hr tablet 50 mg  50 mg Oral Daily Sadeel Fiddler, [name], MD   50 mg at 04/07/20 0806  . ondansetron (ZOFRAN) tablet 4 mg  4 mg Oral Q8H PRN Nitin Mckowen, [name], MD   4 mg at 04/03/20 0909  . oxybutynin (DITROPAN) tablet 10 mg  10 mg Oral BID Minola Guin, [name], MD   10 mg at 04/07/20 0807  . pantoprazole (PROTONIX) EC tablet 40 mg  40 mg Oral Daily Mikayah Joy, [name], MD   40 mg at 04/07/20 0805  . prazosin (MINIPRESS) capsule 1 mg  1 mg Oral QHS Jehan Bonano T, MD   1 mg at 04/06/20 2106  . QUEtiapine (SEROQUEL) tablet 100 mg  100 mg Oral QID Cassian Torelli, [name], MD   100 mg at 04/07/20 0805  . QUEtiapine (SEROQUEL) tablet 400 mg  400 mg Oral QHS [name], NP   400 mg at 04/06/20 2105  . sertraline (ZOLOFT) tablet 100 mg  100 mg Oral BID Carleton [place], [name], MD   100 mg at 04/07/20 0806  . simethicone (MYLICON) chewable tablet 80 mg  80 mg Oral TID Chayce Rullo, [name], MD   80 mg at 04/07/20 0805  . simvastatin (ZOCOR) tablet 20 mg  20 mg Oral q1800 Pamala Hayman, [name], MD   20 mg at 04/06/20 1700  . tamsulosin (FLOMAX) capsule 0.8 mg  0.8 mg Oral QPC supper Octaviano Mukai T, MD   0.8 mg at 04/06/20 1700    Lab Results: No results found for this or any previous visit (from the past 48 hour(s)).  Blood Alcohol level:  Lab Results  Component Value Date   ETH <10 01/22/2020   ETH <10 19/14/7829    Metabolic Disorder Labs: Lab Results  Component  Value Date   HGBA1C 6.6 (H) 02/20/2020   MPG 142.72 02/20/2020   No results found for: PROLACTIN Lab Results  Component Value Date   CHOL 153 04/10/2015   TRIG 166 (H) 04/10/2015   HDL 50 04/10/2015   CHOLHDL 3.1 04/10/2015   VLDL 33 04/10/2015   LDLCALC 70 04/10/2015    Physical Findings: AIMS:  , ,  ,  ,    CIWA:    COWS:     Musculoskeletal: Strength & Muscle Tone: within normal limits Gait & Station: normal Patient leans: N/A  Psychiatric Specialty Exam: Physical Exam Vitals and nursing note reviewed.  Constitutional:      Appearance: He is well-developed.  HENT:     Head: Normocephalic and atraumatic.  Eyes:     Conjunctiva/sclera: Conjunctivae normal.     Pupils: Pupils are equal, round, and reactive to light.  Cardiovascular:     Heart sounds: Normal heart sounds.  Pulmonary:     Effort: Pulmonary effort is normal.  Abdominal:     Palpations: Abdomen is soft.  Musculoskeletal:        General: Normal range of motion.     Cervical back: Normal range of motion.  Skin:    General: Skin is warm and dry.  Neurological:     General: No focal deficit present.     Mental Status: He is alert.  Psychiatric:  Attention and Perception: He is inattentive.        Mood and Affect: Mood is anxious.        Speech: Speech is rapid and pressured.        Behavior: Behavior is agitated. Behavior is not aggressive.        Thought Content: Thought content does not include homicidal or suicidal ideation.        Cognition and Memory: Cognition is impaired.        Judgment: Judgment is impulsive.     Review of Systems  Constitutional: Negative.   HENT: Negative.   Eyes: Negative.   Respiratory: Negative.   Cardiovascular: Negative.   Gastrointestinal: Negative.   Musculoskeletal: Negative.   Skin: Negative.   Neurological: Negative.   Psychiatric/Behavioral: Positive for dysphoric mood. The patient is nervous/anxious.     Blood pressure 125/82, pulse 63,  temperature 98.1 F (36.7 C), temperature source Oral, resp. rate 17, height 5\' 8"  (1.727 m), weight 104.3 kg, SpO2 98 %.Body mass index is 34.96 kg/m.  General Appearance: Casual  Eye Contact:  Good  Speech:  Clear and Coherent  Volume:  Normal  Mood:  Anxious  Affect:  Congruent  Thought Process:  Coherent  Orientation:  Full (Time, Place, and Person)  Thought Content:  Logical  Suicidal Thoughts:  No  Homicidal Thoughts:  No  Memory:  Immediate;   Fair Recent;   Fair Remote;   Fair  Judgement:  Fair  Insight:  Fair  Psychomotor Activity:  Normal  Concentration:  Concentration: Fair  Recall:  [employer] of Knowledge:  Fair  Language:  Fair  Akathisia:  No  Handed:  Right  AIMS (if indicated):     Assets:  Desire for Improvement  ADL's:  Intact  Cognition:  WNL  Sleep:  Number of Hours: 5.75     Treatment Plan Summary: Daily contact with patient to assess and evaluate symptoms and progress in treatment, Medication management and Plan No change to medication.  Supportive counseling.  Discussed situation with treatment team.  Social work will also follow-up with patient about what we know about discharge planning  Alethia Berthold, MD 04/07/2020, 11:27 AM

## 2020-04-07 NOTE — Plan of Care (Signed)
  Problem: Group Participation °Goal: STG - Patient will engage in groups with a calm and appropriate mood at least 2x within 5 recreation therapy group sessions °Description: STG - Patient will engage in groups with a calm and appropriate mood at least 2x within 5 recreation therapy group sessions °Outcome: Progressing °  °

## 2020-04-07 NOTE — Progress Notes (Signed)
D- Patient alert and oriented. Affect/mood is anxious and irritated.. Pt denies SI, HI, AVH, and pain. Pt has been very upset with his care plan. He is fixated with discharging and very concerned that he will live here "forever". He does not listen well when educated and has little insight. .   A- Scheduled medications administered to patient, per MD orders. Support and encouragement provided.  Routine safety checks conducted every 15 minutes.  Patient informed to notify staff with problems or concerns.  R- No adverse drug reactions noted. Patient contracts for safety at this time. Patient compliant with medications and treatment plan. Patient receptive, calm, and cooperative. Patient interacts well with others on the unit.  Patient remains safe at this time.  Collier Bullock RN

## 2020-04-07 NOTE — Progress Notes (Signed)
Pt is anxious because he is wanting to leave. Pt says that is he does not get what he wants it might come to him taking his life. He wants to have more freedom to do what he loves here. He is going to call pt experience after lunch. Pt contracts verbally for safety. Collier Bullock RN

## 2020-04-07 NOTE — Plan of Care (Signed)
Pt rates anxiety, depression and hopelessness all at 10/10. Pt denies SI, HI and AVH. Pt was educated on care plan and verbalizes understanding. Pt was encouraged to attend groups. Collier Bullock RN Problem: Education: Goal: Knowledge of [place] [facility] Education information/materials will improve Outcome: Progressing Goal: Emotional status will improve Outcome: Progressing Goal: Mental status will improve Outcome: Progressing Goal: Verbalization of understanding the information provided will improve Outcome: Progressing   Problem: Activity: Goal: Interest or engagement in activities will improve Outcome: Progressing Goal: Sleeping patterns will improve Outcome: Progressing   Problem: Coping: Goal: Ability to verbalize frustrations and anger appropriately will improve Outcome: Progressing Goal: Ability to demonstrate self-control will improve Outcome: Progressing   Problem: Health Behavior/Discharge Planning: Goal: Identification of resources available to assist in meeting health care needs will improve Outcome: Progressing Goal: Compliance with treatment plan for underlying cause of condition will improve Outcome: Progressing   Problem: Physical Regulation: Goal: Ability to maintain clinical measurements within normal limits will improve Outcome: Progressing   Problem: Safety: Goal: Periods of time without injury will increase Outcome: Progressing   Problem: Education: Goal: Ability to make informed decisions regarding treatment will improve Outcome: Progressing   Problem: Coping: Goal: Coping ability will improve Outcome: Progressing   Problem: Health Behavior/Discharge Planning: Goal: Identification of resources available to assist in meeting health care needs will improve Outcome: Progressing   Problem: Medication: Goal: Compliance with prescribed medication regimen will improve Outcome: Progressing   Problem: Self-Concept: Goal: Ability to disclose and  discuss suicidal ideas will improve Outcome: Progressing Goal: Will verbalize positive feelings about self Outcome: Progressing

## 2020-04-07 NOTE — Progress Notes (Signed)
Recreation Therapy Notes    Date: 04/07/2020  Time: 9:30 am   Location: Craft room     Behavioral response: N/A   Intervention Topic: Strengths  Discussion/Intervention: Patient did not attend group.   Clinical Observations/Feedback:  Patient did not attend group.   Cleofas Hudgins LRT/CTRS         Gifford Ballon 04/07/2020 10:45 AM

## 2020-04-07 NOTE — BHH Counselor (Signed)
CSW spoke with [place] at [facility], [phone].    Per her pt has had a referral for Care Coordination.  She also reports that all information has been gathered in an attempt to add the patient to the state funded group home list.  She reports that at this time Lorene Dy needs to add the patient, however, she is tied up with other work and pt should be added by the close of this week.  Assunta Curtis, MSW, LCSW 04/07/2020 3:22 PM

## 2020-04-07 NOTE — Plan of Care (Signed)
Patient presents at his baseline  Problem: Education: Goal: Emotional status will improve Outcome: Not Progressing Goal: Mental status will improve Outcome: Not Progressing   

## 2020-04-07 NOTE — BHH Counselor (Signed)
CSW contacted the following in an effort to obtain placement for the patient:  [facility], [phone] Number not in working service.  [facility], [phone] CSW unable to leave voicemail as the voicemail box was full.  [facility], [phone] Number not in service.  [facility], [phone] CSW called and was unable to speak with anyone.  Line rang incessantly.    [facility], [phone] Asked to call back after 9:30,  04/08/2020.  [facility] Not admitting at this time.  [facility] Number is not in service.  [facility], [phone] No male beds available at this time.  [facility] No group homes listed.  [facility] No group homes listed.  [facility] No group homes listed.  [place], [place] CSW called 2x, CSW was hung up on both times.  [facility], [phone] CSW left HIPAA compliant voicemail.  [facility], [phone] Requested  CSW call [facility]P, Mr. Dema Severin at [phone].  No male beds available at this time.   [facility], [phone] Asked to call Dr. Julien Girt at [phone].  CSW asked to fax the information to [phone].     [facility], [phone] CSW called and was unable to speak with anyone.  Line rang incessantly.    [facility], [phone] No male beds available.   [facility], [phone] Save information as the previous group home with the same name.  [facility], [phone] CSW asked to call back tomorrow.   Assunta Curtis, MSW, LCSW 04/07/2020 4:07 PM

## 2020-04-07 NOTE — BHH Counselor (Signed)
CSW followed up with patient after Recreation Therapist stated that patient had been looking for this CSW during this CSWs recent absence.    Patient stated that he wanted CSW to follow up with an interview "that some lady helped me with, we interviewed with some man". CSW asked for clarification as to the best of CSW knowledge there has been no additional interviews and CSW has conducted them all and they have been with females.  Patient was unsure.  CSW reiterated several times that she will look through notes and see what can be found.  CSW let him know that she would follow up with the interviews that this CSW has conducted, however, most have declined the patient due to pt's history of wandering.  Patient became upset and stated that he "do not wander, I try to get help".  CSW reframed that when patient leave to go get help it is a concern for most homes, due to patient not asking for help before leaving for help.  [place] became very agitated and upset and raised his voice to this CSW, though did not yell.   CSW again reiterated that she will continue to look for homes, however, pt's history of leaving and list of demands for the homes have been concerning for many placements.  CSW attempted to engage the patient in a discussion on compromise and changing outlooks to see if that would be more appealing to potential homes.  Patient was either unwilling or unable to process this and no matter how many times CSW reframed the patient did not understand this concept.    Patient began to blame the unit stating it was "disgusting" how he was being treated unfairly and that he has rights.  In further conversation with the patient, the patient is referring to not having found placement and not being allowed to do what he wants when he wants.    CSW disengaged from the conversation as it appeared the patient was getting worked up. CSW did reiterate again that she will continue to look for placement.   CSW did  attempt to call [facility], [phone], in regards to placement, however, call was not answered and HIPAA compliant voicemail has been left.  Assunta Curtis, MSW, LCSW 04/07/2020 11:47 AM

## 2020-04-07 NOTE — BHH Counselor (Signed)
CSW spoke with the patient's brother who reports that he has not heard from [place] at [facility].  CSW provided him the contact information and encouraged him to call.   CSW was informed that [name] may have some leads in her search for his group home.    CSW attempted to call [name] at [phone].  CSW left HIPAA compliant voicemail.  Assunta Curtis, MSW, LCSW 04/07/2020 2:14 PM

## 2020-04-08 DIAGNOSIS — F250 Schizoaffective disorder, bipolar type: Secondary | ICD-10-CM | POA: Diagnosis not present

## 2020-04-08 NOTE — Progress Notes (Signed)
Patient calm and pleasant this evening denying SI/HI/AVH. Patient stated, "After talking to my friend in the dayroom, I don't think I am ready to give up yet." Patient given support and encouragement.  Patient compliant with medication administration per MD orders. Patient being monitored Q 15 minutes for safety per unit protocol. Pt remains safe on the unit.

## 2020-04-08 NOTE — Progress Notes (Signed)
[facility] MD Progress Note  04/08/2020 4:05 PM Alexander Beard  MRN:  [id number] Subjective: Follow-up patient with autistic spectrum disorder history of reported schizoaffective disorder.  Patient seen chart reviewed.  For the most part he is having appropriate behavior during the day.  Interacts well with staff.  In one-to-one conversation unfortunately he becomes very agitated once again today going on at length about his frustration at the current treatment.  Patient does not respond well to attempts to reframe the situation but just gets more agitated.  Not violent however not threatening anyone.  We have not had any breakthroughs about discharge planning although social work is still involved with looking for options Principal Problem: Schizoaffective disorder, bipolar type ([place]) Diagnosis: Principal Problem:   Schizoaffective disorder, bipolar type ([place]) Active Problems:   Active autistic disorder   Diabetes ([place])   Hypertension   Prostate hypertrophy   Intellectual disability   At risk for elopement  Total Time spent with patient: 30 minutes  Past Psychiatric History: Longstanding history of chronic disability  Past Medical History:  Past Medical History:  Diagnosis Date   Anxiety    Anxiety disorder due to known physiological condition    HOSPITALIZED 10/18   Arthritis    Autistic disorder, residual state    COPD (chronic obstructive pulmonary disease) ([place])    Depression    Developmental disorder    Diabetes mellitus without complication (HCC)    Dyslipidemia    Esophageal reflux    HOH (hard of hearing)    MILDLY   Hypertension    Obesity    Overactive bladder    Palpitations    ANXIETY   Schizophrenia, schizoaffective (HCC)    Sleep apnea    Tremors of nervous system    HANDS DUE TO MEDICATIONS   Urinary incontinence     Past Surgical History:  Procedure Laterality Date   CATARACT EXTRACTION W/PHACO Left 11/14/2017   Procedure: CATARACT  EXTRACTION PHACO AND INTRAOCULAR LENS PLACEMENT ([place]);  Surgeon: Birder Robson, MD;  Location: [facility] ORS;  Service: Ophthalmology;  Laterality: Left;  [place] 00:42 AP% 14.6 CDE 6.12 Fluid pack lot # [id number]   COLONOSCOPY  01/28/2005   COLONOSCOPY WITH PROPOFOL N/A 05/09/2016   Procedure: COLONOSCOPY WITH PROPOFOL;  Surgeon: Manya Silvas, MD;  Location: [facility] ENDOSCOPY;  Service: Endoscopy;  Laterality: N/A;   ESOPHAGOGASTRODUODENOSCOPY N/A 05/09/2016   Procedure: ESOPHAGOGASTRODUODENOSCOPY (EGD);  Surgeon: Manya Silvas, MD;  Location: [facility] ENDOSCOPY;  Service: Endoscopy;  Laterality: N/A;   FRACTURE SURGERY     ORIF SHOULDER   TONSILLECTOMY     Family History:  Family History  Problem Relation Age of Onset   Hypertension Mother    Stroke Father    Heart Problems Father    Family Psychiatric  History: See previous Social History:  Social History   Substance and Sexual Activity  Alcohol Use No   Alcohol/week: 0.0 standard drinks     Social History   Substance and Sexual Activity  Drug Use No    Social History   Socioeconomic History   Marital status: Single    Spouse name: Not on file   Number of children: Not on file   Years of education: Not on file   Highest education level: Not on file  Occupational History   Not on file  Tobacco Use   Smoking status: Never Smoker   Smokeless tobacco: Never Used  Vaping Use   Vaping Use: Never used  Substance and Sexual  Activity   Alcohol use: No    Alcohol/week: 0.0 standard drinks   Drug use: No   Sexual activity: Never  Other Topics Concern   Not on file  Social History Narrative   The patient never finished high school but did get his GED. 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 He is currently in disability and [name] is his legal guardian. He has been living in group homes for many years.      No pending legal charges    Social Determinants of Health   Financial Resource Strain:    Difficulty of Paying Living Expenses: Not on file  Food Insecurity:    Worried About [place] in the Last Year: Not on file   Ran Out of Food in the Last Year: Not on file  Transportation Needs:    Lack of Transportation (Medical): Not on file   Lack of Transportation (Non-Medical): Not on file  Physical Activity:    Days of Exercise per Week: Not on file   Minutes of Exercise per Session: Not on file  Stress:    Feeling of Stress : Not on file  Social Connections:    Frequency of Communication with Friends and Family: Not on file   Frequency of Social Gatherings with Friends and Family: Not on file   Attends Religious Services: Not on file   Active Member of Clubs or Organizations: Not on file   Attends Archivist Meetings: Not on file   Marital Status: Not on file   Additional Social History:                         Sleep: Fair  Appetite:  Fair  Current Medications: Current Facility-Administered Medications  Medication Dose Route Frequency Provider Last Rate Last Admin   acetaminophen (TYLENOL) tablet 650 mg  650 mg Oral Q6H PRN [name], MD       alum & mag hydroxide-simeth (MAALOX/MYLANTA) 200-200-20 MG/5ML suspension 30 mL  30 mL Oral Q4H PRN [name], MD       aspirin EC tablet 81 mg  81 mg Oral Daily Yailine Ballard, [name], MD   81 mg at 04/08/20 0819   clonazePAM (KLONOPIN) tablet 1 mg  1 mg Oral QID Jaimy Kliethermes T, MD   1 mg at 04/08/20 1216   gabapentin (NEURONTIN) capsule 100 mg  100 mg Oral TID Knut Rondinelli T, MD   100 mg at 04/08/20 1216   hydrOXYzine (ATARAX/VISTARIL) tablet 10 mg  10 mg Oral TID PRN [name], MD   10 mg at 03/30/20 2107   influenza vac split quadrivalent PF (FLUARIX) injection 0.5 mL  0.5 mL Intramuscular Tomorrow-1000 Beyounce Dickens T, MD       lamoTRIgine (LAMICTAL) tablet 100 mg  100 mg Oral QHS Maelani Yarbro,  Greogry Goodwyn T, MD   100 mg at 04/07/20 2124   linagliptin (TRADJENTA) tablet 5 mg  5 mg Oral Daily Olene Godfrey, [name], MD   5 mg at 04/08/20 8338   loperamide (IMODIUM) capsule 2 mg  2 mg Oral Q4H PRN [name], MD       loratadine (CLARITIN) tablet 10 mg  10 mg Oral Daily Briany Aye, [name], MD   10 mg at 04/08/20 0820   LORazepam (ATIVAN) tablet 2 mg  2 mg Oral Q6H PRN Charlynn Salih, [name], MD   2  mg at 04/07/20 1240   magnesium hydroxide (MILK OF MAGNESIA) suspension 30 mL  30 mL Oral Daily PRN [name], MD   30 mL at 03/23/20 1344   metFORMIN (GLUCOPHAGE) tablet 850 mg  850 mg Oral BID WC Nayelli Inglis, [name], MD   850 mg at 04/08/20 0819   metoprolol succinate (TOPROL-XL) 24 hr tablet 50 mg  50 mg Oral Daily Klynn Linnemann T, MD   50 mg at 04/08/20 0818   ondansetron (ZOFRAN) tablet 4 mg  4 mg Oral Q8H PRN Kyrin Garn, [name], MD   4 mg at 04/03/20 0909   oxybutynin (DITROPAN) tablet 10 mg  10 mg Oral BID Maxwell Lemen T, MD   10 mg at 04/08/20 0820   pantoprazole (PROTONIX) EC tablet 40 mg  40 mg Oral Daily Zaki Gertsch, [name], MD   40 mg at 04/08/20 0820   prazosin (MINIPRESS) capsule 1 mg  1 mg Oral QHS Kalissa Grays T, MD   1 mg at 04/06/20 2106   QUEtiapine (SEROQUEL) tablet 100 mg  100 mg Oral QID Marsalis Beaulieu T, MD   100 mg at 04/08/20 1216   QUEtiapine (SEROQUEL) tablet 400 mg  400 mg Oral QHS [name], NP   400 mg at 04/07/20 2124   sertraline (ZOLOFT) tablet 100 mg  100 mg Oral BID Ajla Mcgeachy T, MD   100 mg at 04/08/20 0820   simethicone (MYLICON) chewable tablet 80 mg  80 mg Oral TID Lennie Dunnigan T, MD   80 mg at 04/08/20 1216   simvastatin (ZOCOR) tablet 20 mg  20 mg Oral q1800 Alivya Wegman T, MD   20 mg at 04/07/20 1709   tamsulosin (FLOMAX) capsule 0.8 mg  0.8 mg Oral QPC supper Montavis Schubring T, MD   0.8 mg at 04/07/20 1710    Lab Results: No results found for this or any previous visit (from the past 48 hour(s)).  Blood Alcohol level:  Lab Results  Component  Value Date   ETH <10 01/22/2020   ETH <10 96/29/5284    Metabolic Disorder Labs: Lab Results  Component Value Date   HGBA1C 6.6 (H) 02/20/2020   MPG 142.72 02/20/2020   No results found for: PROLACTIN Lab Results  Component Value Date   CHOL 153 04/10/2015   TRIG 166 (H) 04/10/2015   HDL 50 04/10/2015   CHOLHDL 3.1 04/10/2015   VLDL 33 04/10/2015   LDLCALC 70 04/10/2015    Physical Findings: AIMS:  , ,  ,  ,    CIWA:    COWS:     Musculoskeletal: Strength & Muscle Tone: within normal limits Gait & Station: normal Patient leans: N/A  Psychiatric Specialty Exam: Physical Exam Vitals and nursing note reviewed.  Constitutional:      Appearance: He is well-developed.  HENT:     Head: Normocephalic and atraumatic.  Eyes:     Conjunctiva/sclera: Conjunctivae normal.     Pupils: Pupils are equal, round, and reactive to light.  Cardiovascular:     Heart sounds: Normal heart sounds.  Pulmonary:     Effort: Pulmonary effort is normal.  Abdominal:     Palpations: Abdomen is soft.  Musculoskeletal:        General: Normal range of motion.     Cervical back: Normal range of motion.  Skin:    General: Skin is warm and dry.  Neurological:     General: No focal deficit present.     Mental Status: He  is alert.  Psychiatric:        Attention and Perception: He is inattentive.        Mood and Affect: Mood is anxious. Affect is labile.        Speech: Speech normal.        Behavior: Behavior is agitated. Behavior is not aggressive.        Thought Content: Thought content does not include homicidal or suicidal ideation.        Cognition and Memory: Cognition is impaired.        Judgment: Judgment is impulsive.     Review of Systems  Constitutional: Negative.   HENT: Negative.   Eyes: Negative.   Respiratory: Negative.   Cardiovascular: Negative.   Gastrointestinal: Negative.   Musculoskeletal: Negative.   Skin: Negative.   Neurological: Negative.    Psychiatric/Behavioral: Positive for behavioral problems and dysphoric mood. The patient is nervous/anxious.     Blood pressure 128/83, pulse 67, temperature 98.4 F (36.9 C), temperature source Oral, resp. rate 17, height 5\' 8"  (1.727 m), weight 104.3 kg, SpO2 99 %.Body mass index is 34.96 kg/m.  General Appearance: Casual  Eye Contact:  Fair  Speech:  Garbled  Volume:  Increased  Mood:  Angry, Anxious and Dysphoric  Affect:  Congruent  Thought Process:  Coherent  Orientation:  Full (Time, Place, and Person)  Thought Content:  Illogical, Paranoid Ideation and Rumination  Suicidal Thoughts:  No  Homicidal Thoughts:  No  Memory:  Immediate;   Fair Recent;   Poor Remote;   Poor  Judgement:  Impaired  Insight:  Shallow  Psychomotor Activity:  Decreased  Concentration:  Concentration: Fair  Recall:  [place] of Knowledge:  Poor  Language:  Fair  Akathisia:  No  Handed:  Right  AIMS (if indicated):     Assets:  Desire for Improvement  ADL's:  Impaired  Cognition:  Impaired,  Mild  Sleep:  Number of Hours: 6     Treatment Plan Summary: Daily contact with patient to assess and evaluate symptoms and progress in treatment, Medication management and Plan Patient has as needed medication already ordered.  For the most part he is stabilized on current regimen.  Continue to try and expressed to him some support if he will receive it.  Still working on discharge planning  Alethia Berthold, MD 04/08/2020, 4:05 PM

## 2020-04-08 NOTE — BHH Counselor (Signed)
CSW attempted to call Alexander Beard at [phone].  CSW left HIPAA compliant voicemail.  Assunta Curtis, MSW, LCSW 04/08/2020 1:04 PM

## 2020-04-08 NOTE — Progress Notes (Signed)
D- Patient alert and oriented. Affect/mood is anxious but calm and cooperative. Pt denies SI, HI, AVH, and pain.  A- Scheduled medications administered to patient, per MD orders. Support and encouragement provided.  Routine safety checks conducted every 15 minutes.  Patient informed to notify staff with problems or concerns.  R- No adverse drug reactions noted. Patient contracts for safety at this time. Patient compliant with medications and treatment plan. Patient receptive, calm, and cooperative. Patient interacts well with others on the unit.  Patient remains safe at this time.  Pt made some phone calls today to patient experience and [facility]. Pt went outdoors and really enjoyed himself.  Collier Bullock RN

## 2020-04-08 NOTE — Plan of Care (Signed)
Pt rates depression, anxiety and hopelessness all 10/10. Pt denies HI and AVH. Pt has passive SI but contracts for safety. Pt was encouraged to attend groups. Collier Bullock RN Problem: Education: Goal: Knowledge of [facility] Education information/materials will improve Outcome: Progressing Goal: Emotional status will improve Outcome: Progressing Goal: Mental status will improve Outcome: Progressing Goal: Verbalization of understanding the information provided will improve Outcome: Progressing   Problem: Activity: Goal: Interest or engagement in activities will improve Outcome: Progressing Goal: Sleeping patterns will improve Outcome: Progressing   Problem: Coping: Goal: Ability to verbalize frustrations and anger appropriately will improve Outcome: Progressing Goal: Ability to demonstrate self-control will improve Outcome: Progressing   Problem: Health Behavior/Discharge Planning: Goal: Identification of resources available to assist in meeting health care needs will improve Outcome: Progressing Goal: Compliance with treatment plan for underlying cause of condition will improve Outcome: Progressing   Problem: Physical Regulation: Goal: Ability to maintain clinical measurements within normal limits will improve Outcome: Progressing   Problem: Safety: Goal: Periods of time without injury will increase Outcome: Progressing   Problem: Education: Goal: Ability to make informed decisions regarding treatment will improve Outcome: Progressing   Problem: Coping: Goal: Coping ability will improve Outcome: Progressing   Problem: Health Behavior/Discharge Planning: Goal: Identification of resources available to assist in meeting health care needs will improve Outcome: Progressing   Problem: Medication: Goal: Compliance with prescribed medication regimen will improve Outcome: Progressing   Problem: Self-Concept: Goal: Ability to disclose and discuss suicidal ideas  will improve Outcome: Progressing Goal: Will verbalize positive feelings about self Outcome: Progressing

## 2020-04-08 NOTE — Plan of Care (Signed)
Patient presents at his baseline  Problem: Education: Goal: Emotional status will improve Outcome: Not Progressing Goal: Mental status will improve Outcome: Not Progressing   

## 2020-04-08 NOTE — BHH Counselor (Signed)
CSW again attempted to contact Claiborne Billings at [facility].  CSW left HIPAA compliant voicemail.  Assunta Curtis, MSW, LCSW 04/08/2020 1:03 PM

## 2020-04-09 DIAGNOSIS — F250 Schizoaffective disorder, bipolar type: Secondary | ICD-10-CM | POA: Diagnosis not present

## 2020-04-09 NOTE — Progress Notes (Signed)
Recreation Therapy Notes  Date: 04/09/2020  Time: 9:30 am   Location: Room 21   Behavioral response: N/A   Intervention Topic: Animal Assisted therapy    Discussion/Intervention: Patient did not attend group.   Clinical Observations/Feedback:  Patient did not attend group.   Lanny Donoso LRT/CTRS      Joangel Vanosdol 04/09/2020 12:12 PM

## 2020-04-09 NOTE — Progress Notes (Signed)
D- Patient alert and oriented. Affect/mood is anxious and needy. Pt rates depression and anxiety 10/10. Pt denies HI, AVH, and pain. Pt has passive SI with no plan but verbally contracts for safety. Pt is outside in the courtyard with nursing students.   A- Scheduled medications administered to patient, per MD orders. Support and encouragement provided.  Routine safety checks conducted every 15 minutes.  Patient informed to notify staff with problems or concerns.  R- No adverse drug reactions noted. Patient contracts for safety at this time. Patient compliant with medications and treatment plan. Patient receptive, calm, and cooperative. Patient interacts well with others on the unit.  Patient remains safe at this time.  Collier Bullock RN

## 2020-04-09 NOTE — BHH Counselor (Signed)
CSW spoke with Alexander Beard [phone].  She reports that the prevoius leads that she had are no longer effective as the patient does not have a "memory or demntia" diagnosis. She reports that without a diagnosis of that sort the patient will not be able to go to a secured location.    She reports that the plan may have to be for the patient to go to a group home, until the testing can be completed and a new FL2 can be done.  She reports that she will continue to work with the patient on placement.  CSW attempted to call Claiborne Billings at [facility] and was unable to speak with him.  CSW left HIPAA compliant voicemail.  Assunta Curtis, MSW, LCSW 04/09/2020 3:48 PM

## 2020-04-09 NOTE — Plan of Care (Signed)
  Problem: Education: Goal: Knowledge of [facility] Education information/materials will improve Outcome: Progressing Goal: Emotional status will improve Outcome: Progressing Goal: Mental status will improve Outcome: Progressing Goal: Verbalization of understanding the information provided will improve Outcome: Progressing   Problem: Activity: Goal: Interest or engagement in activities will improve Outcome: Progressing Goal: Sleeping patterns will improve Outcome: Progressing   Problem: Coping: Goal: Ability to verbalize frustrations and anger appropriately will improve Outcome: Progressing Goal: Ability to demonstrate self-control will improve Outcome: Progressing   Problem: Health Behavior/Discharge Planning: Goal: Identification of resources available to assist in meeting health care needs will improve Outcome: Progressing Goal: Compliance with treatment plan for underlying cause of condition will improve Outcome: Progressing   Problem: Physical Regulation: Goal: Ability to maintain clinical measurements within normal limits will improve Outcome: Progressing   Problem: Safety: Goal: Periods of time without injury will increase Outcome: Progressing   Problem: Education: Goal: Ability to make informed decisions regarding treatment will improve Outcome: Progressing   Problem: Coping: Goal: Coping ability will improve Outcome: Progressing   Problem: Health Behavior/Discharge Planning: Goal: Identification of resources available to assist in meeting health care needs will improve Outcome: Progressing   Problem: Medication: Goal: Compliance with prescribed medication regimen will improve Outcome: Progressing   Problem: Self-Concept: Goal: Ability to disclose and discuss suicidal ideas will improve Outcome: Progressing Goal: Will verbalize positive feelings about self Outcome: Progressing   

## 2020-04-09 NOTE — Progress Notes (Signed)
[facility] MD Progress Note  04/09/2020 2:18 PM HANCEL ION  MRN:  [id number] Subjective: Follow-up for patient with schizoaffective disorder and developmental disability.  Today the patient tells the staff and also tells me that he has somewhat reconciled himself to going to a new group home.  He told the staff this morning that he had decided that he was going to behave better and be more understanding.  He seems to be continuing that attitude this afternoon although he also still quickly moves on to a tangent telling me about all of his needs again.  Behavior on the unit is calm.  Getting along well with others.  No physical complaints. Principal Problem: Schizoaffective disorder, bipolar type ([place]) Diagnosis: Principal Problem:   Schizoaffective disorder, bipolar type ([place]) Active Problems:   Active autistic disorder   Diabetes ([place])   Hypertension   Prostate hypertrophy   Intellectual disability   At risk for elopement  Total Time spent with patient: 30 minutes  Past Psychiatric History: Longstanding history of intellectual disability  Past Medical History:  Past Medical History:  Diagnosis Date  . Anxiety   . Anxiety disorder due to known physiological condition    HOSPITALIZED 10/18  . Arthritis   . Autistic disorder, residual state   . COPD (chronic obstructive pulmonary disease) ([place])   . Depression   . Developmental disorder   . Diabetes mellitus without complication ([place])   . Dyslipidemia   . Esophageal reflux   . HOH (hard of hearing)    MILDLY  . Hypertension   . Obesity   . Overactive bladder   . Palpitations    ANXIETY  . Schizophrenia, schizoaffective ([place])   . Sleep apnea   . Tremors of nervous system    HANDS DUE TO MEDICATIONS  . Urinary incontinence     Past Surgical History:  Procedure Laterality Date  . CATARACT EXTRACTION W/PHACO Left 11/14/2017   Procedure: CATARACT EXTRACTION PHACO AND INTRAOCULAR LENS PLACEMENT (IOC);  Surgeon: Birder Robson,  MD;  Location: [facility] ORS;  Service: Ophthalmology;  Laterality: Left;  [place] 00:42 AP% 14.6 CDE 6.12 Fluid pack lot # [id number]  . COLONOSCOPY  01/28/2005  . COLONOSCOPY WITH PROPOFOL N/A 05/09/2016   Procedure: COLONOSCOPY WITH PROPOFOL;  Surgeon: Manya Silvas, MD;  Location: [facility] ENDOSCOPY;  Service: Endoscopy;  Laterality: N/A;  . ESOPHAGOGASTRODUODENOSCOPY N/A 05/09/2016   Procedure: ESOPHAGOGASTRODUODENOSCOPY (EGD);  Surgeon: Manya Silvas, MD;  Location: [facility] ENDOSCOPY;  Service: Endoscopy;  Laterality: N/A;  . FRACTURE SURGERY     ORIF SHOULDER  . TONSILLECTOMY     Family History:  Family History  Problem Relation Age of Onset  . Hypertension Mother   . Stroke Father   . Heart Problems Father    Family Psychiatric  History: None known Social History:  Social History   Substance and Sexual Activity  Alcohol Use No  . Alcohol/week: 0.0 standard drinks     Social History   Substance and Sexual Activity  Drug Use No    Social History   Socioeconomic History  . Marital status: Single    Spouse name: Not on file  . Number of children: Not on file  . Years of education: Not on file  . Highest education level: Not on file  Occupational History  . Not on file  Tobacco Use  . Smoking status: Never Smoker  . Smokeless tobacco: Never Used  Vaping Use  . Vaping Use: Never used  Substance and Sexual Activity  .  Alcohol use: No    Alcohol/week: 0.0 standard drinks  . Drug use: No  . Sexual activity: Never  Other Topics Concern  . Not on file  Social History Narrative   The patient never finished high school but did get his GED. 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 He is currently in disability and [name] is his legal guardian. He has been living in group homes for many years.      No pending legal charges   Social Determinants of Health   Financial Resource Strain:   . Difficulty of  Paying Living Expenses: Not on file  Food Insecurity:   . Worried About Charity fundraiser in the Last Year: Not on file  . Ran Out of Food in the Last Year: Not on file  Transportation Needs:   . Lack of Transportation (Medical): Not on file  . Lack of Transportation (Non-Medical): Not on file  Physical Activity:   . Days of Exercise per Week: Not on file  . Minutes of Exercise per Session: Not on file  Stress:   . Feeling of Stress : Not on file  Social Connections:   . Frequency of Communication with Friends and Family: Not on file  . Frequency of Social Gatherings with Friends and Family: Not on file  . Attends Religious Services: Not on file  . Active Member of Clubs or Organizations: Not on file  . Attends Archivist Meetings: Not on file  . Marital Status: Not on file   Additional Social History:                         Sleep: Fair  Appetite:  Fair  Current Medications: Current Facility-Administered Medications  Medication Dose Route Frequency Provider Last Rate Last Admin  . acetaminophen (TYLENOL) tablet 650 mg  650 mg Oral Q6H PRN [name], MD      . alum & mag hydroxide-simeth (MAALOX/MYLANTA) 200-200-20 MG/5ML suspension 30 mL  30 mL Oral Q4H PRN [name], MD      . aspirin EC tablet 81 mg  81 mg Oral Daily Sidonie Dexheimer, [name], MD   81 mg at 04/09/20 0825  . clonazePAM (KLONOPIN) tablet 1 mg  1 mg Oral QID Abhiraj Dozal, [name], MD   1 mg at 04/09/20 1156  . gabapentin (NEURONTIN) capsule 100 mg  100 mg Oral TID Neely Kammerer T, MD   100 mg at 04/09/20 1156  . hydrOXYzine (ATARAX/VISTARIL) tablet 10 mg  10 mg Oral TID PRN [name], MD   10 mg at 03/30/20 2107  . influenza vac split quadrivalent PF (FLUARIX) injection 0.5 mL  0.5 mL Intramuscular Tomorrow-1000 Leane Loring T, MD      . lamoTRIgine (LAMICTAL) tablet 100 mg  100 mg Oral QHS Armour Villanueva T, MD   100 mg at 04/08/20 2138  . linagliptin (TRADJENTA) tablet 5 mg  5 mg Oral  Daily Rudell Marlowe, [name], MD   5 mg at 04/09/20 0825  . loperamide (IMODIUM) capsule 2 mg  2 mg Oral Q4H PRN [name], MD      . loratadine (CLARITIN) tablet 10 mg  10 mg Oral Daily Etoile Looman, [name], MD   10 mg at 04/09/20 0825  . LORazepam (ATIVAN) tablet 2 mg  2 mg Oral Q6H PRN Nimrit Kehres, [name], MD   2 mg at 04/07/20  1240  . magnesium hydroxide (MILK OF MAGNESIA) suspension 30 mL  30 mL Oral Daily PRN [name], MD   30 mL at 03/23/20 1344  . metFORMIN (GLUCOPHAGE) tablet 850 mg  850 mg Oral BID WC Shantice Menger, [name], MD   850 mg at 04/09/20 0824  . metoprolol succinate (TOPROL-XL) 24 hr tablet 50 mg  50 mg Oral Daily Rawlin Reaume, [name], MD   50 mg at 04/09/20 0826  . ondansetron (ZOFRAN) tablet 4 mg  4 mg Oral Q8H PRN Zabian Swayne, [name], MD   4 mg at 04/03/20 0909  . oxybutynin (DITROPAN) tablet 10 mg  10 mg Oral BID Darenda Fike, [name], MD   10 mg at 04/09/20 0826  . pantoprazole (PROTONIX) EC tablet 40 mg  40 mg Oral Daily Neftali Thurow, [name], MD   40 mg at 04/09/20 0827  . prazosin (MINIPRESS) capsule 1 mg  1 mg Oral QHS Joesphine Schemm T, MD   1 mg at 04/06/20 2106  . QUEtiapine (SEROQUEL) tablet 100 mg  100 mg Oral QID Freyja Govea T, MD   100 mg at 04/09/20 1156  . QUEtiapine (SEROQUEL) tablet 400 mg  400 mg Oral QHS [name], NP   400 mg at 04/08/20 2138  . sertraline (ZOLOFT) tablet 100 mg  100 mg Oral BID Samin Milke, [name], MD   100 mg at 04/09/20 0825  . simethicone (MYLICON) chewable tablet 80 mg  80 mg Oral TID Marlo Goodrich, [name], MD   80 mg at 04/09/20 1156  . simvastatin (ZOCOR) tablet 20 mg  20 mg Oral q1800 Kaidance Pantoja, [name], MD   20 mg at 04/08/20 1713  . tamsulosin (FLOMAX) capsule 0.8 mg  0.8 mg Oral QPC supper Larkin Morelos, [name], MD   0.8 mg at 04/08/20 1714    Lab Results: No results found for this or any previous visit (from the past 48 hour(s)).  Blood Alcohol level:  Lab Results  Component Value Date   ETH <10 01/22/2020   ETH <10 75/64/3329    Metabolic Disorder  Labs: Lab Results  Component Value Date   HGBA1C 6.6 (H) 02/20/2020   MPG 142.72 02/20/2020   No results found for: PROLACTIN Lab Results  Component Value Date   CHOL 153 04/10/2015   TRIG 166 (H) 04/10/2015   HDL 50 04/10/2015   CHOLHDL 3.1 04/10/2015   VLDL 33 04/10/2015   LDLCALC 70 04/10/2015    Physical Findings: AIMS:  , ,  ,  ,    CIWA:    COWS:     Musculoskeletal: Strength & Muscle Tone: within normal limits Gait & Station: normal Patient leans: N/A  Psychiatric Specialty Exam: Physical Exam Vitals and nursing note reviewed.  Constitutional:      Appearance: He is well-developed.  HENT:     Head: Normocephalic and atraumatic.  Eyes:     Conjunctiva/sclera: Conjunctivae normal.     Pupils: Pupils are equal, round, and reactive to light.  Cardiovascular:     Heart sounds: Normal heart sounds.  Pulmonary:     Effort: Pulmonary effort is normal.  Abdominal:     Palpations: Abdomen is soft.  Musculoskeletal:        General: Normal range of motion.     Cervical back: Normal range of motion.  Skin:    General: Skin is warm and dry.  Neurological:     General: No focal deficit present.     Mental Status: He is alert.  Psychiatric:        Attention and Perception: Attention normal.        Mood and Affect: Mood is anxious.        Speech: Speech is rapid and pressured.        Behavior: Behavior is agitated. Behavior is not aggressive.        Thought Content: Thought content does not include homicidal or suicidal ideation.        Cognition and Memory: Cognition is impaired.        Judgment: Judgment is impulsive.     Review of Systems  Constitutional: Negative.   HENT: Negative.   Eyes: Negative.   Respiratory: Negative.   Cardiovascular: Negative.   Gastrointestinal: Negative.   Musculoskeletal: Negative.   Skin: Negative.   Neurological: Negative.   Psychiatric/Behavioral: The patient is nervous/anxious.     Blood pressure 114/78, pulse 72,  temperature 98 F (36.7 C), temperature source Oral, resp. rate 17, height 5\' 8"  (1.727 m), weight 104.3 kg, SpO2 97 %.Body mass index is 34.96 kg/m.  General Appearance: Casual  Eye Contact:  Good  Speech:  Clear and Coherent  Volume:  Normal  Mood:  Euthymic  Affect:  Congruent  Thought Process:  Goal Directed  Orientation:  Full (Time, Place, and Person)  Thought Content:  Logical  Suicidal Thoughts:  No  Homicidal Thoughts:  No  Memory:  Immediate;   Fair Recent;   Fair Remote;   Fair  Judgement:  Fair  Insight:  Fair  Psychomotor Activity:  Normal  Concentration:  Concentration: Fair  Recall:  [employer] of Knowledge:  Fair  Language:  Fair  Akathisia:  No  Handed:  Right  AIMS (if indicated):     Assets:  Desire for Improvement Physical Health Resilience  ADL's:  Impaired  Cognition:  Impaired,  Mild  Sleep:  Number of Hours: 7     Treatment Plan Summary: Daily contact with patient to assess and evaluate symptoms and progress in treatment, Medication management and Plan Follow-up by continuing current medication no change.  As needed's available when he is agitated.  Treatment team discussed with the patient daily and I think we are all on the same page in managing his moods and his behavior.  We are still looking into some kind of disposition without success at this point  Alethia Berthold, MD 04/09/2020, 2:18 PM

## 2020-04-09 NOTE — Plan of Care (Signed)
Patient presents at his baseline  Problem: Education: Goal: Emotional status will improve Outcome: Not Progressing Goal: Mental status will improve Outcome: Not Progressing   

## 2020-04-09 NOTE — Tx Team (Signed)
Interdisciplinary Treatment and Diagnostic Plan Update  04/09/2020 Time of Session: 8:30 AM  Alexander Beard MRN: [id number]  Principal Diagnosis: Schizoaffective disorder, bipolar type ([place])  Secondary Diagnoses: Principal Problem:   Schizoaffective disorder, bipolar type ([place]) Active Problems:   Active autistic disorder   Diabetes ([place])   Hypertension   Prostate hypertrophy   Intellectual disability   At risk for elopement   Current Medications:  Current Facility-Administered Medications  Medication Dose Route Frequency Provider Last Rate Last Admin  . acetaminophen (TYLENOL) tablet 650 mg  650 mg Oral Q6H PRN [name], MD      . alum & mag hydroxide-simeth (MAALOX/MYLANTA) 200-200-20 MG/5ML suspension 30 mL  30 mL Oral Q4H PRN [name], MD      . aspirin EC tablet 81 mg  81 mg Oral Daily [name], [name], MD   81 mg at 04/09/20 0825  . clonazePAM (KLONOPIN) tablet 1 mg  1 mg Oral QID [name], [name], MD   1 mg at 04/09/20 1156  . gabapentin (NEURONTIN) capsule 100 mg  100 mg Oral TID [name], [name], MD   100 mg at 04/09/20 1156  . hydrOXYzine (ATARAX/VISTARIL) tablet 10 mg  10 mg Oral TID PRN [name], MD   10 mg at 03/30/20 2107  . influenza vac split quadrivalent PF (FLUARIX) injection 0.5 mL  0.5 mL Intramuscular Tomorrow-1000 [name], [name], MD      . lamoTRIgine (LAMICTAL) tablet 100 mg  100 mg Oral QHS [name], [name], MD   100 mg at 04/08/20 2138  . linagliptin (TRADJENTA) tablet 5 mg  5 mg Oral Daily [name], [name], MD   5 mg at 04/09/20 0825  . loperamide (IMODIUM) capsule 2 mg  2 mg Oral Q4H PRN [name], MD      . loratadine (CLARITIN) tablet 10 mg  10 mg Oral Daily [name], [name], MD   10 mg at 04/09/20 0825  . LORazepam (ATIVAN) tablet 2 mg  2 mg Oral Q6H PRN [name], [name], MD   2 mg at 04/07/20 1240  . magnesium hydroxide (MILK OF MAGNESIA) suspension 30 mL  30 mL Oral Daily PRN [name], MD   30 mL at 03/23/20 1344  .  metFORMIN (GLUCOPHAGE) tablet 850 mg  850 mg Oral BID WC [name], [name], MD   850 mg at 04/09/20 0824  . metoprolol succinate (TOPROL-XL) 24 hr tablet 50 mg  50 mg Oral Daily [name], [name], MD   50 mg at 04/09/20 0826  . ondansetron (ZOFRAN) tablet 4 mg  4 mg Oral Q8H PRN [name], [name], MD   4 mg at 04/03/20 0909  . oxybutynin (DITROPAN) tablet 10 mg  10 mg Oral BID [name], [name], MD   10 mg at 04/09/20 0826  . pantoprazole (PROTONIX) EC tablet 40 mg  40 mg Oral Daily [name], [name], MD   40 mg at 04/09/20 0827  . prazosin (MINIPRESS) capsule 1 mg  1 mg Oral QHS [name], [name], MD   1 mg at 04/06/20 2106  . QUEtiapine (SEROQUEL) tablet 100 mg  100 mg Oral QID [name], [name], MD   100 mg at 04/09/20 1156  . QUEtiapine (SEROQUEL) tablet 400 mg  400 mg Oral QHS [name], NP   400 mg at 04/08/20 2138  . sertraline (ZOLOFT) tablet 100 mg  100 mg Oral BID [name], [name], MD   100 mg at 04/09/20 0825  .  simethicone (MYLICON) chewable tablet 80 mg  80 mg Oral TID [name], [name], MD   80 mg at 04/09/20 1156  . simvastatin (ZOCOR) tablet 20 mg  20 mg Oral q1800 [name], [name], MD   20 mg at 04/08/20 1713  . tamsulosin (FLOMAX) capsule 0.8 mg  0.8 mg Oral QPC supper [name], [name], MD   0.8 mg at 04/08/20 1714   PTA Medications: Medications Prior to Admission  Medication Sig Dispense Refill Last Dose  . acetaminophen (TYLENOL) 650 MG CR tablet Take 650 mg by mouth every 12 (twelve) hours as needed for pain.     Marland Kitchen aspirin EC 81 MG tablet Take 81 mg by mouth daily.     . Calcium Carbonate-Vitamin D3 (CALCIUM 600-D) 600-400 MG-UNIT TABS Take 1 tablet by mouth 2 (two) times daily.     . Cholecalciferol (VITAMIN D-1000 MAX ST) 1000 UNITS tablet Take 1,000 Units by mouth daily.      Marland Kitchen docusate sodium (COLACE) 100 MG capsule Take 100 mg by mouth daily.      . furosemide (LASIX) 20 MG tablet Take 20 mg by mouth daily.      Marland Kitchen gabapentin (NEURONTIN) 100 MG capsule Take 1 capsule (100 mg  total) by mouth 3 (three) times daily. 90 capsule 10   . Insulin Detemir (LEVEMIR FLEXTOUCH) 100 UNIT/ML Pen Inject 15 Units into the skin daily at 10 pm.     . lamoTRIgine (LAMICTAL) 100 MG tablet Take 1 tablet (100 mg total) by mouth at bedtime. 30 tablet 10   . loratadine (CLARITIN) 10 MG tablet Take 1 tablet (10 mg total) by mouth daily. 30 tablet 5   . LORazepam (ATIVAN) 1 MG tablet Take 1 tablet (1 mg total) by mouth 3 (three) times daily. 90 tablet 5   . lurasidone (LATUDA) 40 MG TABS tablet Take 1 tablet (40 mg total) by mouth daily. with food 30 tablet 10   . metoprolol succinate (TOPROL-XL) 50 MG 24 hr tablet Take 50 mg by mouth daily.      . NON FORMULARY cpap device     . oxybutynin (DITROPAN) 5 MG tablet Take 5 mg by mouth daily.     . pantoprazole (PROTONIX) 40 MG tablet Take 40 mg by mouth daily.     . polyethylene glycol (MIRALAX / GLYCOLAX) packet Take 17 g by mouth daily.     . QUEtiapine (SEROQUEL) 100 MG tablet TAKE 1 TABLET  BY MOUTH THREE TIMES A DAY (Patient taking differently: Take 100 mg by mouth 3 (three) times daily. TAKE 1 TABLET  BY MOUTH THREE TIMES A DAY) 90 tablet 11   . QUEtiapine (SEROQUEL) 400 MG tablet Take 1 tablet (400 mg total) by mouth at bedtime. 30 tablet 10   . sertraline (ZOLOFT) 100 MG tablet Take 2 tablets (200 mg total) by mouth daily. (Patient taking differently: Take 200 mg by mouth daily. Take along with two 50 mg tablets (100 mg) for total 300 mg daily) 60 tablet 5   . sertraline (ZOLOFT) 50 MG tablet Take 2 tablets (100 mg total) by mouth daily. (Patient taking differently: Take 100 mg by mouth daily. Take along with two 100 mg tablets (200 mg) for total 300 mg daily) 60 tablet 5   . simethicone (MYLICON) 80 MG chewable tablet Chew 80-160 mg by mouth 2 (two) times daily as needed for flatulence.     . simvastatin (ZOCOR) 20 MG tablet Take 20 mg by mouth at bedtime.      Marland Kitchen  sitaGLIPtin (JANUVIA) 100 MG tablet Take 100 mg by mouth daily.     .  solifenacin (VESICARE) 5 MG tablet Take 5 mg by mouth daily.     . Starch (HEMORRHOIDAL RE) Place 1 application rectally 4 (four) times daily as needed (for itching).     . tamsulosin (FLOMAX) 0.4 MG CAPS capsule Take 0.4 mg by mouth daily.       Patient Stressors: Health problems Marital or family conflict Medication change or noncompliance Traumatic event  Patient Strengths: Motivation for treatment/growth Religious Affiliation  Treatment Modalities: Medication Management, Group therapy, Case management,  1 to 1 session with clinician, Psychoeducation, Recreational therapy.   Physician Treatment Plan for Primary Diagnosis: Schizoaffective disorder, bipolar type ([place]) Long Term Goal(s): Improvement in symptoms so as ready for discharge Improvement in symptoms so as ready for discharge   Short Term Goals: Ability to verbalize feelings will improve Ability to demonstrate self-control will improve Ability to maintain clinical measurements within normal limits will improve Compliance with prescribed medications will improve  Medication Management: Evaluate patient's response, side effects, and tolerance of medication regimen.  Therapeutic Interventions: 1 to 1 sessions, Unit Group sessions and Medication administration.  Evaluation of Outcomes: Progressing   Physician Treatment Plan for Secondary Diagnosis: Principal Problem:   Schizoaffective disorder, bipolar type ([place]) Active Problems:   Active autistic disorder   Diabetes ([place])   Hypertension   Prostate hypertrophy   Intellectual disability   At risk for elopement  Long Term Goal(s): Improvement in symptoms so as ready for discharge Improvement in symptoms so as ready for discharge   Short Term Goals: Ability to verbalize feelings will improve Ability to demonstrate self-control will improve Ability to maintain clinical measurements within normal limits will improve Compliance with prescribed medications will improve      Medication Management: Evaluate patient's response, side effects, and tolerance of medication regimen.  Therapeutic Interventions: 1 to 1 sessions, Unit Group sessions and Medication administration.  Evaluation of Outcomes: Progressing   RN Treatment Plan for Primary Diagnosis: Schizoaffective disorder, bipolar type ([place]) Long Term Goal(s): Knowledge of disease and therapeutic regimen to maintain health will improve  Short Term Goals: Ability to demonstrate self-control, Ability to participate in decision making will improve, Ability to verbalize feelings will improve, Ability to disclose and discuss suicidal ideas, Ability to identify and develop effective coping behaviors will improve and Compliance with prescribed medications will improve  Medication Management: RN will administer medications as ordered by provider, will assess and evaluate patient's response and provide education to patient for prescribed medication. RN will report any adverse and/or side effects to prescribing provider.  Therapeutic Interventions: 1 on 1 counseling sessions, Psychoeducation, Medication administration, Evaluate responses to treatment, Monitor vital signs and CBGs as ordered, Perform/monitor CIWA, COWS, AIMS and Fall Risk screenings as ordered, Perform wound care treatments as ordered.  Evaluation of Outcomes: Progressing   LCSW Treatment Plan for Primary Diagnosis: Schizoaffective disorder, bipolar type ([place]) Long Term Goal(s): Safe transition to appropriate next level of care at discharge, Engage patient in therapeutic group addressing interpersonal concerns.  Short Term Goals: Engage patient in aftercare planning with referrals and resources, Increase social support, Increase ability to appropriately verbalize feelings, Increase emotional regulation and Increase skills for wellness and recovery  Therapeutic Interventions: Assess for all discharge needs, 1 to 1 time with Social worker, Explore  available resources and support systems, Assess for adequacy in community support network, Educate family and significant other(s) on suicide prevention, Complete Psychosocial Assessment, Interpersonal  group therapy.  Evaluation of Outcomes: Progressing   Progress in Treatment: Attending groups: Yes. Participating in groups: Yes. Taking medication as prescribed: Yes. Toleration medication: Yes. Family/Significant other contact made: Yes, individual(s) contacted:  V[place]ery [place], brother  Patient understands diagnosis: Yes. Discussing patient identified problems/goals with staff: Yes. Medical problems stabilized or resolved: Yes. Denies suicidal/homicidal ideation: Yes. Issues/concerns per patient self-inventory: No. Other: N/a  New problem(s) identified: No, Describe:  None   New Short Term/Long Term Goal(s): Elimination of symptoms of psychosis, medication management for mood stabilization; elimination of SI thoughts; development of comprehensive mental wellness/sobriety plan.Update 03/01/20:No changes at this time. 03/07/20: Update, no changes at this time Update 03/12/2020: No changes at this time.Update 03/17/2020:No changes at this time. Update 03/21/20: No changes at this time. Update 03/26/20:  No changes at this time. Update 03/31/2020:  No changes at this time. Update 04/04/20: No changes at this time Update 04/09/2020:  No changes at this time.     Patient Goals:  Patient stated he would like to go back to [place] and reconnect with his peer support. Patient also stated that he would like to increase his social support and help his brother.Update 03/07/20,no change Update 03/12/2020: No changes at this time. Update 03/17/2020:No changes at this time.Update 03/21/20: No changes at this time. Update 03/26/20:  No changes at this time. Update 03/31/2020:  No changes at this time. Update 04/04/20: No changes at this time.  Update 04/09/2020:  No changes at this time.    Discharge Plan or Barriers:  Patient has an interview with a group home on 03/09/20 at 2:00 PM. Update 03/12/2020: CSW continues to assist the patient in developing a housing plan.Update 03/17/2020:CSW has assisted patient in completing an interview with a group home, however, there has been no word on if the patient has been approved. Patient continues to struggle with his anxiety and nerves. Nurses report that he has increased in bed wetting. Psychiatrist and nurses believe that this may be related to his increased anxiety about finding a home. Update 03/21/20: Patient is still nervous and anxious about finding a new placement. Patient stated he could not stay here too much longer and wanted his life back. Update 03/26/20: CSW continues to look for placement for pt.  Group Homes, [place] are being considered. Update 03/31/2020:  Patient continues to have interview with group homes, however, no success at this time in identifying a home that has accepted him. Update: 04/04/20: No changes at this time.  Update 04/09/2020:  CSW continues to look for placement for patient.    Reason for Continuation of Hospitalization: Anxiety Depression Medication stabilization  Estimated Length of Stay: TBD  Attendees: Patient: 04/09/2020 12:35 PM  Physician: [name], MD 04/09/2020 12:35 PM  Nursing:  04/09/2020 12:35 PM  RN Care Manager: 04/09/2020 12:35 PM  Social Worker: Assunta Curtis, LCSW 04/09/2020 12:35 PM  Recreational Therapist:  04/09/2020 12:35 PM  Other:  04/09/2020 12:35 PM  Other:  04/09/2020 12:35 PM  Other: 04/09/2020 12:35 PM    Scribe for Treatment Team: [name], LCSW 04/09/2020 12:35 PM

## 2020-04-09 NOTE — BHH Counselor (Signed)
CSW contacted the following in an effort to find bed placement for the patient:  [facility], [phone] No male beds available at the time.   [facility] No group homes available.   [facility] [phone], [phone] No male beds available.  [address], [phone] No male beds available.  [facility], [phone] Male bed available, fax information to [phone].  CSW confirmed fax was successful.  [facility], [phone] Number not in working order.   Assunta Curtis, MSW, LCSW 04/09/2020 4:10 PM

## 2020-04-09 NOTE — BHH Group Notes (Signed)
Adult Psychoeducational Group Note  Date:  04/09/2020 Time:  6:41 AM  Group Topic/Focus:  Wrap-Up Group:   The focus of this group is to help patients review their daily goal of treatment and discuss progress on daily workbooks.  Participation Level:  Active  Participation Quality:  Supportive  Affect:  Appropriate  Cognitive:  Appropriate  Insight: Appropriate  Engagement in Group:  Engaged  Modes of Intervention:  Education  Additional Comments:  discussion was about "call don't fall" and personal hygiene  Kiki Bivens Marcine Matar 04/09/2020, 6:41 AM

## 2020-04-10 DIAGNOSIS — F250 Schizoaffective disorder, bipolar type: Secondary | ICD-10-CM | POA: Diagnosis not present

## 2020-04-10 NOTE — Progress Notes (Signed)
[facility] MD Progress Note  04/10/2020 4:36 PM Alexander Beard  MRN:  [id number] Subjective:  Chronic severe anxiety Principal Problem: Schizoaffective disorder, bipolar type ([place]) Diagnosis: Principal Problem:   Schizoaffective disorder, bipolar type ([place]) Active Problems:   Active autistic disorder   Diabetes ([place])   Hypertension   Prostate hypertrophy   Intellectual disability   At risk for elopement  Total Time spent with patient: 30 minutes  Past Psychiatric History: autism   Past Medical History:  Past Medical History:  Diagnosis Date   Anxiety    Anxiety disorder due to known physiological condition    HOSPITALIZED 10/18   Arthritis    Autistic disorder, residual state    COPD (chronic obstructive pulmonary disease) ([place])    Depression    Developmental disorder    Diabetes mellitus without complication (HCC)    Dyslipidemia    Esophageal reflux    HOH (hard of hearing)    MILDLY   Hypertension    Obesity    Overactive bladder    Palpitations    ANXIETY   Schizophrenia, schizoaffective (HCC)    Sleep apnea    Tremors of nervous system    HANDS DUE TO MEDICATIONS   Urinary incontinence     Past Surgical History:  Procedure Laterality Date   CATARACT EXTRACTION W/PHACO Left 11/14/2017   Procedure: CATARACT EXTRACTION PHACO AND INTRAOCULAR LENS PLACEMENT ([place]);  Surgeon: Birder Robson, MD;  Location: [facility] ORS;  Service: Ophthalmology;  Laterality: Left;  [place] 00:42 AP% 14.6 CDE 6.12 Fluid pack lot # [id number]   COLONOSCOPY  01/28/2005   COLONOSCOPY WITH PROPOFOL N/A 05/09/2016   Procedure: COLONOSCOPY WITH PROPOFOL;  Surgeon: Manya Silvas, MD;  Location: [facility] ENDOSCOPY;  Service: Endoscopy;  Laterality: N/A;   ESOPHAGOGASTRODUODENOSCOPY N/A 05/09/2016   Procedure: ESOPHAGOGASTRODUODENOSCOPY (EGD);  Surgeon: Manya Silvas, MD;  Location: [facility] ENDOSCOPY;  Service: Endoscopy;  Laterality: N/A;   FRACTURE SURGERY     ORIF SHOULDER    TONSILLECTOMY     Family History:  Family History  Problem Relation Age of Onset   Hypertension Mother    Stroke Father    Heart Problems Father    Family Psychiatric  History: see above Social History:  Social History   Substance and Sexual Activity  Alcohol Use No   Alcohol/week: 0.0 standard drinks     Social History   Substance and Sexual Activity  Drug Use No    Social History   Socioeconomic History   Marital status: Single    Spouse name: Not on file   Number of children: Not on file   Years of education: Not on file   Highest education level: Not on file  Occupational History   Not on file  Tobacco Use   Smoking status: Never Smoker   Smokeless tobacco: Never Used  Vaping Use   Vaping Use: Never used  Substance and Sexual Activity   Alcohol use: No    Alcohol/week: 0.0 standard drinks   Drug use: No   Sexual activity: Never  Other Topics Concern   Not on file  Social History Narrative   The patient never finished high school but did get his GED. 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 He is currently in disability and [name] is his legal guardian. He has been living in group homes for many years.      No pending legal charges  Social Determinants of Health   Financial Resource Strain:    Difficulty of Paying Living Expenses: Not on file  Food Insecurity:    Worried About Charity fundraiser in the Last Year: Not on file   [employer] of Food in the Last Year: Not on file  Transportation Needs:    Lack of Transportation (Medical): Not on file   Lack of Transportation (Non-Medical): Not on file  Physical Activity:    Days of Exercise per Week: Not on file   Minutes of Exercise per Session: Not on file  Stress:    Feeling of Stress : Not on file  Social Connections:    Frequency of Communication with Friends and Family: Not on file   Frequency of  Social Gatherings with Friends and Family: Not on file   Attends Religious Services: Not on file   Active Member of Clubs or Organizations: Not on file   Attends Archivist Meetings: Not on file   Marital Status: Not on file   Additional Social History:                         Sleep: Fair  Appetite:  Fair  Current Medications: Current Facility-Administered Medications  Medication Dose Route Frequency Provider Last Rate Last Admin   acetaminophen (TYLENOL) tablet 650 mg  650 mg Oral Q6H PRN [name], MD       alum & mag hydroxide-simeth (MAALOX/MYLANTA) 200-200-20 MG/5ML suspension 30 mL  30 mL Oral Q4H PRN [name], MD       aspirin EC tablet 81 mg  81 mg Oral Daily Kairos Panetta T, MD   81 mg at 04/10/20 0912   clonazePAM (KLONOPIN) tablet 1 mg  1 mg Oral QID Keenon Leitzel T, MD   1 mg at 04/10/20 1300   gabapentin (NEURONTIN) capsule 100 mg  100 mg Oral TID [name] T, MD   100 mg at 04/10/20 1300   hydrOXYzine (ATARAX/VISTARIL) tablet 10 mg  10 mg Oral TID PRN [name], MD   10 mg at 03/30/20 2107   influenza vac split quadrivalent PF (FLUARIX) injection 0.5 mL  0.5 mL Intramuscular Tomorrow-1000 Whittaker Lenis T, MD       lamoTRIgine (LAMICTAL) tablet 100 mg  100 mg Oral QHS Ramandeep Arington T, MD   100 mg at 04/09/20 2122   linagliptin (TRADJENTA) tablet 5 mg  5 mg Oral Daily Deysy Schabel, [name], MD   5 mg at 04/10/20 0912   loperamide (IMODIUM) capsule 2 mg  2 mg Oral Q4H PRN [name], MD   2 mg at 04/10/20 0917   loratadine (CLARITIN) tablet 10 mg  10 mg Oral Daily Analeia Ismael T, MD   10 mg at 04/10/20 0913   LORazepam (ATIVAN) tablet 2 mg  2 mg Oral Q6H PRN Everrett Lacasse T, MD   2 mg at 04/07/20 1240   magnesium hydroxide (MILK OF MAGNESIA) suspension 30 mL  30 mL Oral Daily PRN [name], MD   30 mL at 03/23/20 1344   metFORMIN (GLUCOPHAGE) tablet 850 mg  850 mg Oral BID WC Tyheim Vanalstyne T, MD   850 mg at  04/10/20 0912   metoprolol succinate (TOPROL-XL) 24 hr tablet 50 mg  50 mg Oral Daily Orley Lawry T, MD   50 mg at 04/10/20 0912   ondansetron (ZOFRAN) tablet 4 mg  4 mg Oral Q8H PRN Tequilla Cousineau, [name], MD  4 mg at 04/03/20 0909   oxybutynin (DITROPAN) tablet 10 mg  10 mg Oral BID Shany Marinez, [name], MD   10 mg at 04/10/20 0912   pantoprazole (PROTONIX) EC tablet 40 mg  40 mg Oral Daily Odis Turck, [name], MD   40 mg at 04/10/20 0912   prazosin (MINIPRESS) capsule 1 mg  1 mg Oral QHS Archer Vise T, MD   1 mg at 04/09/20 2121   QUEtiapine (SEROQUEL) tablet 100 mg  100 mg Oral QID Riel Hirschman T, MD   100 mg at 04/10/20 1300   QUEtiapine (SEROQUEL) tablet 400 mg  400 mg Oral QHS [name], NP   400 mg at 04/09/20 2122   sertraline (ZOLOFT) tablet 100 mg  100 mg Oral BID Kayren Holck T, MD   100 mg at 04/10/20 0913   simethicone (MYLICON) chewable tablet 80 mg  80 mg Oral TID Grettell Ransdell T, MD   80 mg at 04/10/20 1300   simvastatin (ZOCOR) tablet 20 mg  20 mg Oral q1800 Fady Stamps T, MD   20 mg at 04/09/20 1653   tamsulosin (FLOMAX) capsule 0.8 mg  0.8 mg Oral QPC supper Cheyane Ayon T, MD   0.8 mg at 04/09/20 1651    Lab Results: No results found for this or any previous visit (from the past 48 hour(s)).  Blood Alcohol level:  Lab Results  Component Value Date   ETH <10 01/22/2020   ETH <10 93/57/0177    Metabolic Disorder Labs: Lab Results  Component Value Date   HGBA1C 6.6 (H) 02/20/2020   MPG 142.72 02/20/2020   No results found for: PROLACTIN Lab Results  Component Value Date   CHOL 153 04/10/2015   TRIG 166 (H) 04/10/2015   HDL 50 04/10/2015   CHOLHDL 3.1 04/10/2015   VLDL 33 04/10/2015   LDLCALC 70 04/10/2015    Physical Findings: AIMS:  , ,  ,  ,    CIWA:    COWS:     Musculoskeletal: Strength & Muscle Tone: within normal limits Gait & Station: normal Patient leans: N/A  Psychiatric Specialty Exam: Physical Exam Vitals and nursing  note reviewed.  Constitutional:      Appearance: He is well-developed.  HENT:     Head: Normocephalic and atraumatic.  Eyes:     Conjunctiva/sclera: Conjunctivae normal.     Pupils: Pupils are equal, round, and reactive to light.  Cardiovascular:     Heart sounds: Normal heart sounds.  Pulmonary:     Effort: Pulmonary effort is normal.  Abdominal:     Palpations: Abdomen is soft.  Musculoskeletal:        General: Normal range of motion.     Cervical back: Normal range of motion.  Skin:    General: Skin is warm and dry.  Neurological:     General: No focal deficit present.     Mental Status: He is alert.  Psychiatric:        Attention and Perception: He is inattentive.        Mood and Affect: Mood is anxious.        Speech: Speech normal.        Behavior: Behavior is agitated. Behavior is not aggressive.        Thought Content: Thought content does not include homicidal or suicidal ideation.        Cognition and Memory: Cognition is impaired.        Judgment: Judgment is impulsive.  Review of Systems  Constitutional: Negative.   HENT: Negative.   Eyes: Negative.   Respiratory: Negative.   Cardiovascular: Negative.   Gastrointestinal: Negative.   Musculoskeletal: Negative.   Skin: Negative.   Neurological: Negative.   Psychiatric/Behavioral: Positive for agitation. The patient is nervous/anxious.     Blood pressure 119/79, pulse 60, temperature 98.6 F (37 C), temperature source Oral, resp. rate 17, height 5\' 8"  (1.727 m), weight 104.3 kg, SpO2 98 %.Body mass index is 34.96 kg/m.  General Appearance: Casual  Eye Contact:  Good  Speech:  Clear and Coherent  Volume:  Normal  Mood:  Anxious  Affect:  Labile  Thought Process:  Coherent  Orientation:  Full (Time, Place, and Person)  Thought Content:  Illogical, Rumination and Tangential  Suicidal Thoughts:  No  Homicidal Thoughts:  No  Memory:  Immediate;   Fair Recent;   Fair Remote;   Fair  Judgement:   Impaired  Insight:  Shallow  Psychomotor Activity:  Restlessness  Concentration:  Concentration: Fair  Recall:  [employer] of Knowledge:  Fair  Language:  Fair  Akathisia:  No  Handed:  Right  AIMS (if indicated):     Assets:  Desire for Improvement Resilience Social Support  ADL's:  Impaired  Cognition:  Impaired,  Mild  Sleep:  Number of Hours: 5.25     Treatment Plan Summary: Daily contact with patient to assess and evaluate symptoms and progress in treatment, Medication management and Plan supportive therapy. no change to medication. still awaiting placement  Alethia Berthold, MD 04/10/2020, 4:36 PM

## 2020-04-10 NOTE — Progress Notes (Signed)
Patient calm and pleasant this evening denying SI/HI/AVH. Patient preoccupied with finding a group home that will allow him to watch his TV shows and give him the joy he deserves. Patient given support and encouragement.  Patient compliant with medication administration per MD orders. Patient being monitored Q 15 minutes for safety per unit protocol. Pt remains safe on the unit.

## 2020-04-10 NOTE — Plan of Care (Signed)
D- Patient alert and oriented. Patient presented in a preoccupied, but pleasant mood on assessment stating that he slept good last night and had no major complaints to voice to this Probation officer. Patient did state that he has some concerns, "I'm worried about the virus, not being able to see my TV shows when I go to my new place. I want to do something for others to help them with the COVID virus". Patient denies SI, HI, AVH, and pain at this time. Patient endorsed both depression/anxiety for these reasons. Patient's goal for today is to "discharge, art work, going outside with nursing students, and doing games", in which he will "stay calm, have fun, rest, self control, TV shows", will help him achieve his goals   A- Scheduled medications administered to patient, per MD orders. Support and encouragement provided.  Routine safety checks conducted every 15 minutes.  Patient informed to notify staff with problems or concerns.  R- No adverse drug reactions noted. Patient contracts for safety at this time. Patient compliant with medications and treatment plan. Patient receptive, calm, and cooperative. Patient interacts well with others on the unit.  Patient remains safe at this time.  Problem: Education: Goal: Knowledge of  General Education information/materials will improve Outcome: Progressing Goal: Emotional status will improve Outcome: Progressing Goal: Mental status will improve Outcome: Progressing Goal: Verbalization of understanding the information provided will improve Outcome: Progressing   Problem: Activity: Goal: Interest or engagement in activities will improve Outcome: Progressing Goal: Sleeping patterns will improve Outcome: Progressing   Problem: Coping: Goal: Ability to verbalize frustrations and anger appropriately will improve Outcome: Progressing Goal: Ability to demonstrate self-control will improve Outcome: Progressing   Problem: Health Behavior/Discharge  Planning: Goal: Identification of resources available to assist in meeting health care needs will improve Outcome: Progressing Goal: Compliance with treatment plan for underlying cause of condition will improve Outcome: Progressing   Problem: Physical Regulation: Goal: Ability to maintain clinical measurements within normal limits will improve Outcome: Progressing   Problem: Safety: Goal: Periods of time without injury will increase Outcome: Progressing   Problem: Education: Goal: Ability to make informed decisions regarding treatment will improve Outcome: Progressing   Problem: Coping: Goal: Coping ability will improve Outcome: Progressing   Problem: Health Behavior/Discharge Planning: Goal: Identification of resources available to assist in meeting health care needs will improve Outcome: Progressing   Problem: Medication: Goal: Compliance with prescribed medication regimen will improve Outcome: Progressing   Problem: Self-Concept: Goal: Ability to disclose and discuss suicidal ideas will improve Outcome: Progressing Goal: Will verbalize positive feelings about self Outcome: Progressing

## 2020-04-11 NOTE — Progress Notes (Signed)
   04/11/20 0900  Psych Admission Type (Psych Patients Only)  Admission Status Involuntary  Psychosocial Assessment  Patient Complaints Anxiety  Eye Contact Fair  Facial Expression Anxious  Affect Anxious  Speech Logical/coherent  Interaction Childlike;Needy  Motor Activity Slow  Appearance/Hygiene Disheveled  Behavior Characteristics Cooperative  Mood Pleasant  Aggressive Behavior  Effect No apparent injury  Thought Process  Coherency WDL  Content Blaming others  Delusions None reported or observed  Perception WDL  Hallucination None reported or observed  Judgment Impaired  Confusion Mild  Danger to Self  Current suicidal ideation? Denies  Self-Injurious Behavior No self-injurious ideation or behavior indicators observed or expressed   Agreement Not to Harm Self Yes  Description of Agreement verbal  Danger to Others  Danger to Others None reported or observed  Pt c/o anxiety 4/10 this morning Prn Atarax Po given (see Mar) reported effective. Patient worried about rules when he gets discharged. Denies SI/HI and verbally contracted for safety. Support and encouragement Provided as needed.

## 2020-04-11 NOTE — Progress Notes (Signed)
   04/11/20 0912  COVID-19 Daily Checkoff  Have you had a fever (temp > 37.80C/100F)  in the past 24 hours?  No  If you have had runny nose, nasal congestion, sneezing in the past 24 hours, has it worsened? No  COVID-19 EXPOSURE  Have you traveled outside the state in the past 14 days? No  Have you been in contact with someone with a confirmed diagnosis of COVID-19 or PUI in the past 14 days without wearing appropriate PPE? No  Have you been living in the same home as a person with confirmed diagnosis of COVID-19 or a PUI (household contact)? No  Have you been diagnosed with COVID-19? No

## 2020-04-11 NOTE — B[facility] Group Notes (Signed)
[facility] LCSW Group Therapy  04/11/2020 2:19 PM   Group Topic: The focus of this group is to identify areas and triggers of stress and discover ways to eleviate and manage stress.   Type of Therapy:  Psychoeducational Skills  Participation Level:  Did Not Attend  Participation Quality:  Did not attend  Affect:  N/A  Cognitive:  N/A  Insight:  None  Engagement in Therapy:  None  Modes of Intervention:    Summary of Progress/Problems: This patient was invited to attend by CSW, however this patient declined attendance.    Asiah Browder A Shammond Arave 04/11/2020, 2:19 PM

## 2020-04-11 NOTE — Plan of Care (Signed)
  Problem: Education: Goal: Knowledge of [place] General Education information/materials will improve Outcome: Progressing Goal: Emotional status will improve Outcome: Progressing Goal: Mental status will improve Outcome: Progressing Goal: Verbalization of understanding the information provided will improve Outcome: Progressing   Problem: Activity: Goal: Interest or engagement in activities will improve Outcome: Progressing Goal: Sleeping patterns will improve Outcome: Progressing   

## 2020-04-11 NOTE — Progress Notes (Addendum)
[facility] MD Progress Note  04/11/2020 12:56 PM Alexander Beard  MRN:  [id number] Subjective:   I do not know who are you  Principal Problem: Schizoaffective disorder, bipolar type ([place]) Diagnosis: Principal Problem:   Schizoaffective disorder, bipolar type ([place]) Active Problems:   Active autistic disorder   Diabetes ([place])   Hypertension   Prostate hypertrophy   Intellectual disability   At risk for elopement Evolving dementia  Total Time spent with patient:  15-20  Past Psychiatric History: well k nown   Past Medical History:  Past Medical History:  Diagnosis Date  . Anxiety   . Anxiety disorder due to known physiological condition    HOSPITALIZED 10/18  . Arthritis   . Autistic disorder, residual state   . COPD (chronic obstructive pulmonary disease) ([place])   . Depression   . Developmental disorder   . Diabetes mellitus without complication ([place])   . Dyslipidemia   . Esophageal reflux   . HOH (hard of hearing)    MILDLY  . Hypertension   . Obesity   . Overactive bladder   . Palpitations    ANXIETY  . Schizophrenia, schizoaffective ([place])   . Sleep apnea   . Tremors of nervous system    HANDS DUE TO MEDICATIONS  . Urinary incontinence     Past Surgical History:  Procedure Laterality Date  . CATARACT EXTRACTION W/PHACO Left 11/14/2017   Procedure: CATARACT EXTRACTION PHACO AND INTRAOCULAR LENS PLACEMENT (IOC);  Surgeon: Birder Robson, MD;  Location: [facility] ORS;  Service: Ophthalmology;  Laterality: Left;  [place] 00:42 AP% 14.6 CDE 6.12 Fluid pack lot # [id number]  . COLONOSCOPY  01/28/2005  . COLONOSCOPY WITH PROPOFOL N/A 05/09/2016   Procedure: COLONOSCOPY WITH PROPOFOL;  Surgeon: Manya Silvas, MD;  Location[facility] ENDOSCOPY;  Service: Endoscopy;  Laterality: N/A;  . ESOPHAGOGASTRODUODENOSCOPY N/A 05/09/2016   Procedure: ESOPHAGOGASTRODUODENOSCOPY (EGD);  Surgeon: Manya Silvas, MD;  Location: [facility] ENDOSCOPY;  Service: Endoscopy;  Laterality: N/A;  . FRACTURE  SURGERY     ORIF SHOULDER  . TONSILLECTOMY     Family History:  Family History  Problem Relation Age of Onset  . Hypertension Mother   . Stroke Father   . Heart Problems Father    Family Psychiatric  History: already noted  Social History:  Social History   Substance and Sexual Activity  Alcohol Use No  . Alcohol/week: 0.0 standard drinks     Social History   Substance and Sexual Activity  Drug Use No    Social History   Socioeconomic History  . Marital status: Single    Spouse name: Not on file  . Number of children: Not on file  . Years of education: Not on file  . Highest education level: Not on file  Occupational History  . Not on file  Tobacco Use  . Smoking status: Never Smoker  . Smokeless tobacco: Never Used  Vaping Use  . Vaping Use: Never used  Substance and Sexual Activity  . Alcohol use: No    Alcohol/week: 0.0 standard drinks  . Drug use: No  . Sexual activity: Never  Other Topics Concern  . Not on file  Social History Narrative   The patient never finished high school but did get his GED. 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 He is currently in disability and [name] is his legal guardian. He has been living in group homes for  many years.      No pending legal charges   Social Determinants of Health   Financial Resource Strain:   . Difficulty of Paying Living Expenses: Not on file  Food Insecurity:   . Worried About Charity fundraiser in the Last Year: Not on file  . Ran Out of Food in the Last Year: Not on file  Transportation Needs:   . Lack of Transportation (Medical): Not on file  . Lack of Transportation (Non-Medical): Not on file  Physical Activity:   . Days of Exercise per Week: Not on file  . Minutes of Exercise per Session: Not on file  Stress:   . Feeling of Stress : Not on file  Social Connections:   . Frequency of Communication with Friends and  Family: Not on file  . Frequency of Social Gatherings with Friends and Family: Not on file  . Attends Religious Services: Not on file  . Active Member of Clubs or Organizations: Not on file  . Attends Archivist Meetings: Not on file  . Marital Status: Not on file   Additional Social History:           He has remained on unit for quite some time Meds are the same he looks more  Haggard and withdrawn.  Issues of depression and dementia awaits group home which is taking time.   He says he has no side effects or new medical   Did not recall who I was I rounded several times for him and previously sat him down when  He was in acute distress and dysphoric as we see sometimes in dementia                 Sleep: variable   Appetite:  Variable   Current Medications: Current Facility-Administered Medications  Medication Dose Route Frequency Provider Last Rate Last Admin  . acetaminophen (TYLENOL) tablet 650 mg  650 mg Oral Q6H PRN [name], MD      . alum & mag hydroxide-simeth (MAALOX/MYLANTA) 200-200-20 MG/5ML suspension 30 mL  30 mL Oral Q4H PRN [name], MD      . aspirin EC tablet 81 mg  81 mg Oral Daily [name], [name], MD   81 mg at 04/11/20 1610  . clonazePAM (KLONOPIN) tablet 1 mg  1 mg Oral QID [name], [name], MD   1 mg at 04/11/20 1204  . gabapentin (NEURONTIN) capsule 100 mg  100 mg Oral TID [name], [name], MD   100 mg at 04/11/20 1204  . hydrOXYzine (ATARAX/VISTARIL) tablet 10 mg  10 mg Oral TID PRN [name], MD   10 mg at 04/11/20 9604  . influenza vac split quadrivalent PF (FLUARIX) injection 0.5 mL  0.5 mL Intramuscular Tomorrow-1000 [name], [name], MD      . lamoTRIgine (LAMICTAL) tablet 100 mg  100 mg Oral QHS [name], [name], MD   100 mg at 04/10/20 2107  . linagliptin (TRADJENTA) tablet 5 mg  5 mg Oral Daily [name], [name], MD   5 mg at 04/11/20 5409  . loperamide (IMODIUM) capsule 2 mg  2 mg Oral Q4H PRN [name], MD   2  mg at 04/10/20 0917  . loratadine (CLARITIN) tablet 10 mg  10 mg Oral Daily [name], [name], MD   10 mg at 04/11/20 8119  . LORazepam (ATIVAN) tablet 2 mg  2 mg Oral Q6H PRN [name], [name], MD   2 mg at 04/07/20  1240  . magnesium hydroxide (MILK OF MAGNESIA) suspension 30 mL  30 mL Oral Daily PRN [name], MD   30 mL at 03/23/20 1344  . metFORMIN (GLUCOPHAGE) tablet 850 mg  850 mg Oral BID WC [name], [name], MD   850 mg at 04/11/20 0867  . metoprolol succinate (TOPROL-XL) 24 hr tablet 50 mg  50 mg Oral Daily [name], [name], MD   50 mg at 04/10/20 0912  . ondansetron (ZOFRAN) tablet 4 mg  4 mg Oral Q8H PRN [name], [name], MD   4 mg at 04/03/20 0909  . oxybutynin (DITROPAN) tablet 10 mg  10 mg Oral BID [name], [name], MD   10 mg at 04/11/20 6195  . pantoprazole (PROTONIX) EC tablet 40 mg  40 mg Oral Daily [name], [name], MD   40 mg at 04/11/20 0823  . prazosin (MINIPRESS) capsule 1 mg  1 mg Oral QHS [name], [name], MD   1 mg at 04/10/20 2107  . QUEtiapine (SEROQUEL) tablet 100 mg  100 mg Oral QID [name], [name], MD   100 mg at 04/11/20 1204  . QUEtiapine (SEROQUEL) tablet 400 mg  400 mg Oral QHS [name], NP   400 mg at 04/10/20 2106  . sertraline (ZOLOFT) tablet 100 mg  100 mg Oral BID [name], [name], MD   100 mg at 04/11/20 0823  . simethicone (MYLICON) chewable tablet 80 mg  80 mg Oral TID [name], [name], MD   80 mg at 04/11/20 1204  . simvastatin (ZOCOR) tablet 20 mg  20 mg Oral q1800 [name], [name], MD   20 mg at 04/10/20 1801  . tamsulosin (FLOMAX) capsule 0.8 mg  0.8 mg Oral QPC supper [name], [name], MD   0.8 mg at 04/10/20 1800    Lab Results: No results found for this or any previous visit (from the past 48 hour(s)).  Blood Alcohol level:  Lab Results  Component Value Date   ETH <10 01/22/2020   ETH <10 09/32/6712    Metabolic Disorder Labs: Lab Results  Component Value Date   HGBA1C 6.6 (H) 02/20/2020   MPG 142.72 02/20/2020   No results found  for: PROLACTIN Lab Results  Component Value Date   CHOL 153 04/10/2015   TRIG 166 (H) 04/10/2015   HDL 50 04/10/2015   CHOLHDL 3.1 04/10/2015   VLDL 33 04/10/2015   LDLCALC 70 04/10/2015    Physical Findings: AIMS:  , ,  ,  ,    CIWA:    COWS:     Musculoskeletal: Strength & Muscle Tone: possible PT not clear  Gait & Station: slow   FALL RISK possibly  Patient leans: hunched over forward   Roller walker with practice could help if allowed   Psychiatric Specialty Exam: Physical Exam  Review of Systems  Blood pressure 121/72, pulse (!) 57, temperature 98.3 F (36.8 C), temperature source Oral, resp. rate 18, height 5\' 8"  (1.727 m), weight 104.3 kg, SpO2 100 %.Body mass index is 34.96 kg/m.    Mental Status  Haggard hunched over slow  Oriented times one today Consciousness not clouded or fluctuant Concentration and attention fair to poor Movements --no frank tics shakes tremors Some bradykinesia Mood and affect somewhat flat  --chronic Thought process and content --vague, victim themes focused on previous group home injustice Si and HI denies Memory impaired in general  Fund of knowledge intelligence declining Judgment insight reliability all poor Abstraction fair to poor  concrete  Speech vague slow --- Rapport and eye contact fair to poor                                                 Sleep:  Number of Hours: 8    Psychomotor activity ----slow Recall poor Cognition declining  Handedness not known  Aims not done Assets ---caring team Sleep on and off Language slow --english ADL-s  At times needs assistance    A/P  Very long stay awaits placement  Evolving issues surrounding dementia and related   Still needs mgt Fall risk may still be important  Continues in  [place] as best as he can   [place] Summary: Daily med rounds, CW support groups if he can go  Extra help with  possible diff OT and PT ---awaits group home    [name], MD 04/11/2020, 12:56 PM

## 2020-04-11 NOTE — Plan of Care (Signed)
  Problem: Education: Goal: Knowledge of [facility] Education information/materials will improve Outcome: Progressing Goal: Emotional status will improve Outcome: Progressing Goal: Mental status will improve Outcome: Progressing Goal: Verbalization of understanding the information provided will improve Outcome: Progressing   Problem: Activity: Goal: Interest or engagement in activities will improve Outcome: Progressing Goal: Sleeping patterns will improve Outcome: Progressing   

## 2020-04-11 NOTE — Progress Notes (Signed)
Patient has been confused this shift. Reported to me at beginning of shift that he was confused. Was found in another patient's room with another patient's journal, getting very upset, "read this! I don't know what it means!". Redirected patient to put the journal back where he found it and to go back to his room. Was very upset that he had gone into someone else's room. Not seen wandering into any other rooms and yesterday, was seen helping a new patient find his way around the unit. Denies SI, HI and AVH

## 2020-04-11 NOTE — Progress Notes (Signed)
Patient very distraught. Perseverating about not knowing where he is going to go and insisting that if he stays here any longer he will become suicidal. No plan voiced, he says "that will be the end result". He insists on wanting something done "now" to find him a place to go so that he can watch his TV shows that are very important to him and and to pursue leisure activities that he enjoys. Asked me to speak with his brother, [name]. Spoke with brother and he says that he is willing to buy his brother a smart TV if he is allowed to have one in his room, wherever he is going. Patient continued to get upset, wanting to find a place to go immediately. Patient's brother reports "this is how he always gets". Encouraged patient to calm down and advised him that if he is truly feeling suicidal he will not be able to leave the hospital until he is feeling safe and that will prolong the process of finding placement for him. Patient was able to calm down and went to bed.

## 2020-04-12 NOTE — BHH Group Notes (Signed)
LCSW Aftercare Discharge Planning Group Note   04/12/2020 1:19-2:34 PM   Type of Group and Topic: Psychoeducational Group:  Discharge Planning  Participation Level:  Active  Description of Group  Discharge planning group reviews patient's anticipated discharge plans and assists patients to anticipate and address any barriers to wellness/recovery in the community.  Suicide prevention education is reviewed with patients in group.  Therapeutic Goals 1. Patients will state their anticipated discharge plan and mental health aftercare 2. Patients will identify potential barriers to wellness in the community setting 3. Patients will engage in problem solving, solution focused discussion of ways to anticipate and address barriers to wellness/recovery  Summary of Patient Progress: Patient checked into group feeling upset. Patient stated he was not able to go outside yesterday and today. Patient stated this is bad for him and he doesn't know how much longer he can take being in the hospital. Patient stated that he would like to play corn hole and play basketball. Patient stated these types of things help him. Patient stated they are looking for a group home for him and believes his past is preventing him from going to a group home. Patient stated he would like to get back to being out in the community and singing at the ballpark. Patient also would like to spend time with his brother and watch his TV shows. Patient stated his art work also helps him cope.        Therapeutic Modalities: Motivational Interviewing    [name], Latanya Presser 04/12/2020 4:39 PM

## 2020-04-12 NOTE — Plan of Care (Signed)
  Problem: Education: Goal: Knowledge of [facility] Education information/materials will improve 04/12/2020 2243 by Libby Maw, RN Outcome: Progressing 04/12/2020 2202 by Libby Maw, RN Outcome: Progressing Goal: Emotional status will improve 04/12/2020 2243 by Libby Maw, RN Outcome: Progressing 04/12/2020 2202 by Libby Maw, RN Outcome: Progressing Goal: Mental status will improve 04/12/2020 2243 by Libby Maw, RN Outcome: Progressing 04/12/2020 2202 by Libby Maw, RN Outcome: Progressing Goal: Verbalization of understanding the information provided will improve 04/12/2020 2243 by Libby Maw, RN Outcome: Progressing 04/12/2020 2202 by Libby Maw, RN Outcome: Progressing

## 2020-04-12 NOTE — Plan of Care (Signed)
  Problem: Education: Goal: Knowledge of [facility] Education information/materials will improve Outcome: Progressing Goal: Emotional status will improve Outcome: Progressing Goal: Mental status will improve Outcome: Progressing Goal: Verbalization of understanding the information provided will improve Outcome: Progressing   

## 2020-04-12 NOTE — Progress Notes (Signed)
Patient has continued to be anxious. Today he is concerned about whether his drawing of Nelia Shi would be considered pornographic. The drawing he had made just showed a face and the outline of the body with no details filled in, so I reassured the patient that his drawing was fine. Denies SI, HI and AVH

## 2020-04-12 NOTE — Progress Notes (Addendum)
Patient ID: Alexander Beard, male   DOB: October 04, 1955, 64 y.o.   MRN: [id number]   Brief Sunday note --expanded was written yesterday  Continues on unit in mixed state and dementing issues  He often laments and is too hard on Self and refers always chronically to past group home issues, possibly has some PTSD    Meds same, chronically confused ---needs redirection at times,  Stays in dark room sometimes   Appearance baseline --chronically haggard  Oriented times two Consciousness not clouded or fluctuant Concentration and attention poor Memory at baseline with errors Speech low tone volume  Thought content --process ---remorse ---and repetitive t hermes Judgment insight reliability, intelligence, fund of knowledge in decline No active SI or HI plans Abstraction limited Movements --slight tremors in general  Gait and station ---limited needs walker and assistance at times   Leans forward Handedness not known  Recall poor Assets --caring team  ADL's  At times needs help  Psychomotor generally slow  Sleep --too much in day  Is bored Aims not done  Cognition limited poor     A/P  Adjustment disorder  Evolving progressive dementia Issues of major depression severe but history of schizoaffective disorder    Generalized anxiety    No new side effects or medical problems mentioned   Continues with same regimen and routine inpatient care  Group home placement is big factor  With time spent here so long hopefully report can say he has been relatively okay ---otherwise not clear if he needs NH placement with or without APS   I am not following regularly    Rama Anne Fu MD 25 min

## 2020-04-13 DIAGNOSIS — F250 Schizoaffective disorder, bipolar type: Secondary | ICD-10-CM | POA: Diagnosis not present

## 2020-04-13 MED ORDER — DIVALPROEX SODIUM ER 500 MG PO TB24
1000.0000 mg | ORAL_TABLET | Freq: Every day | ORAL | Status: DC
  Administered 2020-04-13 – 2020-04-16 (×4): 1000 mg via ORAL
  Filled 2020-04-13 (×4): qty 2

## 2020-04-13 MED ORDER — SERTRALINE HCL 25 MG PO TABS
50.0000 mg | ORAL_TABLET | Freq: Two times a day (BID) | ORAL | Status: DC
  Administered 2020-04-13 – 2020-06-08 (×112): 50 mg via ORAL
  Filled 2020-04-13 (×112): qty 2

## 2020-04-13 NOTE — Progress Notes (Signed)
Patient came into the medication room stating that he could not take it anymore, "I'm going insane being in here". Patient voiced his frustrations with being in the hospital and the restrictions and stating that we are not trying to help him. Patient stated that his rights are bigger than this writer's. This Probation officer tried to reassure patient that staff is trying to find placement for him. Patient stated that we have left him no choice and that he is going to call the police. This Probation officer stated to Probation officer that calling the police is not going to help him and that the police would call the unit and inform us that someone called them. Patient the proceeds to say "my knee", while holding his right knee, and acting as if he couldn't stand. This Probation officer, along with another staff member, helped patient to the ground, where he sat for about ten seconds. Patient then stated that he needed help getting up. This Probation officer, along with Reggie, MHT, helped patient up to a standing position and helped him to the chair outside of the medication room. Patient then stated that he was going to go eat dinner, and walked off to the day room. Patient took medications without any issues.

## 2020-04-13 NOTE — Progress Notes (Signed)
Recreation Therapy Notes  Date: 04/13/2020  Time: 9:30 am   Location: Courtyard   Behavioral response: Anxious, tearful, disheveled  Intervention Topic: Social Skills    Discussion/Intervention:  Group content on today was focused on Education officer, community. The group defined social skills and identified ways they use social skills. Patients expressed what obstacles they face when trying to be social. Participants described the importance of social skills. The group listed ways to improve social skills and reasons to improve social skills. Individuals had an opportunity to learn new and improve social skills as well as identify their weaknesses.  Clinical Observations/Feedback: Patient came to group and expressed he is no longer considering suicide as an option. He explained that he needs help and became tearful about being in the hospital so long. Support was provided by staff and peers, but patient is fixated on leaving as soon as possible and does not understand why he has not been found a place; and why the hospital has so many rules. Participant sobbed and trembled the entire group.    Brooklyn Jeff LRT/CTRS         Tomoko Sandra 04/13/2020 12:34 PM

## 2020-04-13 NOTE — Plan of Care (Signed)
D- Patient alert and oriented. Patient presented in a depressed/preoccupied mood on assessment stating that he slept good last night and had no major complaints to voice to this Probation officer. Patient endorsed both depression/anxiety stating "I woke up feeling real good, but now I'm depressed. I'm real worried and upset about the virus. I'm afraid that it's going to get in the way of doing the things that I like to do". Patient denied SI, HI, AVH, and pain at this time. Patient's goal for today is "art, going outside with song, requesting corn hole", in which "self-control, enjoy myself", will help him to achieve his goal.  A- Scheduled medications administered to patient, per MD orders. Support and encouragement provided.  Routine safety checks conducted every 15 minutes.  Patient informed to notify staff with problems or concerns.  R- No adverse drug reactions noted. Patient contracts for safety at this time. Patient compliant with medications and treatment plan. Patient receptive, calm, and cooperative. Patient interacts well with others on the unit.  Patient remains safe at this time.  Problem: Education: Goal: Knowledge of [facility] [place] [facility] Education information/materials will improve Outcome: Not Progressing Goal: Emotional status will improve Outcome: Not Progressing Goal: Mental status will improve Outcome: Not Progressing Goal: Verbalization of understanding the information provided will improve Outcome: Not Progressing   Problem: Activity: Goal: Interest or engagement in activities will improve Outcome: Not Progressing Goal: Sleeping patterns will improve Outcome: Not Progressing   Problem: Coping: Goal: Ability to verbalize frustrations and anger appropriately will improve Outcome: Not Progressing Goal: Ability to demonstrate self-control will improve Outcome: Not Progressing   Problem: Health Behavior/Discharge Planning: Goal: Identification of resources available to assist in  meeting health care needs will improve Outcome: Not Progressing Goal: Compliance with treatment plan for underlying cause of condition will improve Outcome: Not Progressing   Problem: Physical Regulation: Goal: Ability to maintain clinical measurements within normal limits will improve Outcome: Not Progressing   Problem: Safety: Goal: Periods of time without injury will increase Outcome: Not Progressing   Problem: Education: Goal: Ability to make informed decisions regarding treatment will improve Outcome: Not Progressing   Problem: Coping: Goal: Coping ability will improve Outcome: Not Progressing   Problem: Health Behavior/Discharge Planning: Goal: Identification of resources available to assist in meeting health care needs will improve Outcome: Not Progressing   Problem: Medication: Goal: Compliance with prescribed medication regimen will improve Outcome: Not Progressing   Problem: Self-Concept: Goal: Ability to disclose and discuss suicidal ideas will improve Outcome: Not Progressing Goal: Will verbalize positive feelings about self Outcome: Not Progressing   Problem: Coping: Goal: Coping ability will improve Outcome: Not Progressing Goal: Will verbalize feelings Outcome: Not Progressing   Problem: Coping: Goal: Ability to identify and develop effective coping behavior will improve Outcome: Not Progressing

## 2020-04-13 NOTE — B[facility] Counselor (Signed)
CSW attempted to reach [place] at [facility].  CSW left HIPAA compliant voicemail.  Assunta Curtis, MSW, LCSW 04/13/2020 1:52 PM

## 2020-04-13 NOTE — [facility] Group Notes (Signed)
LCSW Group Therapy Note   04/13/2020 3:35 PM  Type of Therapy and Topic:  Group Therapy:  Overcoming Obstacles   Participation Level:  Active   Description of Group:    In this group patients will be encouraged to explore what they see as obstacles to their own wellness and recovery. They will be guided to discuss their thoughts, feelings, and behaviors related to these obstacles. The group will process together ways to cope with barriers, with attention given to specific choices patients can make. Each patient will be challenged to identify changes they are motivated to make in order to overcome their obstacles. This group will be process-oriented, with patients participating in exploration of their own experiences as well as giving and receiving support and challenge from other group members.   Therapeutic Goals: 1. Patient will identify personal and current obstacles as they relate to admission. 2. Patient will identify barriers that currently interfere with their wellness or overcoming obstacles.  3. Patient will identify feelings, thought process and behaviors related to these barriers. 4. Patient will identify two changes they are willing to make to overcome these obstacles:      Summary of Patient Progress Patient was an active participant in group. Patient had to be redirected several times.  Patient reported that his obstacles have been "too many rules and regulations on the unit".  CSW worked with patient to work out why the rules are in place.     Therapeutic Modalities:   Cognitive Behavioral Therapy Solution Focused Therapy Motivational Interviewing Relapse Prevention Therapy  Assunta Curtis, MSW, LCSW 04/13/2020 3:35 PM

## 2020-04-13 NOTE — Progress Notes (Signed)
[facility] MD Progress Note  04/13/2020 2:23 PM Alexander Beard  MRN:  [id number] Subjective: Follow-up for this 64 year old man with chronic disability as detailed previously.  Once again today he has intermittent spells of getting himself worked up into a state in which he becomes almost panicky with anger and frustration.  Spoke with him for a while in the office and tried to help him to see how he could control that and calm himself down by focusing on more positive solutions and positive things to do in the moment.  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  I also reviewed a lot of his old notes and the medicines he has been on in the past.  It looks like there was an extended period of time years ago when he was on Depakote which apparently has dropped out of his treatment regimen recently. Principal Problem: Schizoaffective disorder, bipolar type ([place]) Diagnosis: Principal Problem:   Schizoaffective disorder, bipolar type ([place]) Active Problems:   Active autistic disorder   Diabetes ([place])   Hypertension   Prostate hypertrophy   Intellectual disability   At risk for elopement  Total Time spent with patient: 30 minutes  Past Psychiatric History: Past history of longstanding essentially lifelong disability related to developmental disability issues with a subsequent diagnosis of schizoaffective disorder.  No history of violence or suicide attempts  Past Medical History:  Past Medical History:  Diagnosis Date  . Anxiety   . Anxiety disorder due to known physiological condition    HOSPITALIZED 10/18  . Arthritis   . Autistic disorder, residual state   . COPD (chronic obstructive pulmonary disease) ([place])   . Depression   . Developmental disorder   . Diabetes mellitus without complication ([place])   . Dyslipidemia   . Esophageal reflux   . HOH (hard of  hearing)    MILDLY  . Hypertension   . Obesity   . Overactive bladder   . Palpitations    ANXIETY  . Schizophrenia, schizoaffective ([place])   . Sleep apnea   . Tremors of nervous system    HANDS DUE TO MEDICATIONS  . Urinary incontinence     Past Surgical History:  Procedure Laterality Date  . CATARACT EXTRACTION W/PHACO Left 11/14/2017   Procedure: CATARACT EXTRACTION PHACO AND INTRAOCULAR LENS PLACEMENT (IOC);  Surgeon: Birder Robson, MD;  Location: [facility] ORS;  Service: Ophthalmology;  Laterality: Left;  [place] 00:42 AP% 14.6 CDE 6.12 Fluid pack lot # [id number]  . COLONOSCOPY  01/28/2005  . COLONOSCOPY WITH PROPOFOL N/A 05/09/2016   Procedure: COLONOSCOPY WITH PROPOFOL;  Surgeon: Manya Silvas, MD;  Location: [facility] ENDOSCOPY;  Service: Endoscopy;  Laterality: N/A;  . ESOPHAGOGASTRODUODENOSCOPY N/A 05/09/2016   Procedure: ESOPHAGOGASTRODUODENOSCOPY (EGD);  Surgeon: Manya Silvas, MD;  Location: [facility] ENDOSCOPY;  Service: Endoscopy;  Laterality: N/A;  . FRACTURE SURGERY     ORIF SHOULDER  . TONSILLECTOMY     Family History:  Family History  Problem Relation Age of Onset  . Hypertension Mother   . Stroke Father   . Heart Problems Father    Family Psychiatric  History: See previous.  Really no history available Social History:  Social History   Substance and Sexual Activity  Alcohol Use No  . Alcohol/week: 0.0 standard drinks     Social History   Substance and Sexual Activity  Drug Use No    Social History   Socioeconomic History  . Marital status: Single    Spouse name: Not on file  . Number of children: Not on file  . Years of education: Not on file  . Highest education level: Not on file  Occupational History  . Not on file  Tobacco Use  . Smoking status: Never Smoker  . Smokeless tobacco: Never Used  Vaping Use  . Vaping Use: Never used  Substance and Sexual Activity  . Alcohol use: No    Alcohol/week: 0.0 standard drinks  . Drug use: No  . Sexual  activity: Never  Other Topics Concern  . Not on file  Social History Narrative   The patient never finished high school but did get his GED. 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 He is currently in disability and [name] is his legal guardian. He has been living in group homes for many years.      No pending legal charges   Social Determinants of Health   Financial Resource Strain:   . Difficulty of Paying Living Expenses: Not on file  Food Insecurity:   . Worried About Charity fundraiser in the Last Year: Not on file  . Ran Out of Food in the Last Year: Not on file  Transportation Needs:   . Lack of Transportation (Medical): Not on file  . Lack of Transportation (Non-Medical): Not on file  Physical Activity:   . Days of Exercise per Week: Not on file  . Minutes of Exercise per Session: Not on file  Stress:   . Feeling of Stress : Not on file  Social Connections:   . Frequency of Communication with Friends and Family: Not on file  . Frequency of Social Gatherings with Friends and Family: Not on file  . Attends Religious Services: Not on file  . Active Member of Clubs or Organizations: Not on file  . Attends Archivist Meetings: Not on file  . Marital Status: Not on file   Additional Social History:                         Sleep: Fair  Appetite:  Fair  Current Medications: Current Facility-Administered Medications  Medication Dose Route Frequency Provider Last Rate Last Admin  . acetaminophen (TYLENOL) tablet 650 mg  650 mg Oral Q6H PRN [name], MD      . alum & mag hydroxide-simeth (MAALOX/MYLANTA) 200-200-20 MG/5ML suspension 30 mL  30 mL Oral Q4H PRN [name], MD      . aspirin EC tablet 81 mg  81 mg Oral Daily Elvis Laufer, [name], MD   81 mg at 04/13/20 0935  . clonazePAM (KLONOPIN) tablet 1 mg  1 mg Oral QID Everly Rubalcava T, MD   1 mg at 04/13/20 1300  .  divalproex (DEPAKOTE ER) 24 hr tablet 1,000 mg  1,000 mg Oral QHS Keiana Tavella T, MD      . gabapentin (NEURONTIN) capsule 100 mg  100 mg Oral TID Jorita Bohanon T, MD   100 mg at 04/13/20 1300  . hydrOXYzine (ATARAX/VISTARIL) tablet 10 mg  10 mg Oral TID PRN [name], MD   10 mg at 04/12/20 7619  . influenza vac split quadrivalent PF (FLUARIX) injection 0.5 mL  0.5 mL Intramuscular Tomorrow-1000 Ela Moffat, [name], MD      .  lamoTRIgine (LAMICTAL) tablet 100 mg  100 mg Oral QHS Jaqualin Serpa, [name], MD   100 mg at 04/12/20 2114  . linagliptin (TRADJENTA) tablet 5 mg  5 mg Oral Daily Sriram Febles, [name], MD   5 mg at 04/13/20 0935  . loperamide (IMODIUM) capsule 2 mg  2 mg Oral Q4H PRN [name], MD   2 mg at 04/10/20 0917  . loratadine (CLARITIN) tablet 10 mg  10 mg Oral Daily Carrine Kroboth, [name], MD   10 mg at 04/13/20 0935  . LORazepam (ATIVAN) tablet 2 mg  2 mg Oral Q6H PRN Kurtis Anastasia, [name], MD   2 mg at 04/07/20 1240  . magnesium hydroxide (MILK OF MAGNESIA) suspension 30 mL  30 mL Oral Daily PRN [name], MD   30 mL at 03/23/20 1344  . metFORMIN (GLUCOPHAGE) tablet 850 mg  850 mg Oral BID WC Asahd Can, [name], MD   850 mg at 04/13/20 0935  . metoprolol succinate (TOPROL-XL) 24 hr tablet 50 mg  50 mg Oral Daily Flecia Shutter, [name], MD   50 mg at 04/13/20 0935  . ondansetron (ZOFRAN) tablet 4 mg  4 mg Oral Q8H PRN Madhuri Vacca, [name], MD   4 mg at 04/03/20 0909  . oxybutynin (DITROPAN) tablet 10 mg  10 mg Oral BID Carissa Musick, [name], MD   10 mg at 04/13/20 0936  . pantoprazole (PROTONIX) EC tablet 40 mg  40 mg Oral Daily Edna Grover, [name], MD   40 mg at 04/13/20 0934  . prazosin (MINIPRESS) capsule 1 mg  1 mg Oral QHS Blue Ruggerio, [name], MD   1 mg at 04/10/20 2107  . QUEtiapine (SEROQUEL) tablet 100 mg  100 mg Oral QID Danon Lograsso T, MD   100 mg at 04/13/20 1300  . QUEtiapine (SEROQUEL) tablet 400 mg  400 mg Oral QHS [name], NP   400 mg at 04/12/20 2113  . sertraline (ZOLOFT) tablet 50 mg  50  mg Oral BID Ravindra Baranek, [name], MD      . simethicone [facility]) chewable tablet 80 mg  80 mg Oral TID Donte Kary, [name], MD   80 mg at 04/13/20 1300  . simvastatin (ZOCOR) tablet 20 mg  20 mg Oral q1800 Efrain Clauson, [name], MD   20 mg at 04/12/20 1652  . tamsulosin (FLOMAX) capsule 0.8 mg  0.8 mg Oral QPC supper Shawnte Winton T, MD   0.8 mg at 04/12/20 1652    Lab Results: No results found for this or any previous visit (from the past 48 hour(s)).  Blood Alcohol level:  Lab Results  Component Value Date   ETH <10 01/22/2020   ETH <10 05/39/7673    Metabolic Disorder Labs: Lab Results  Component Value Date   HGBA1C 6.6 (H) 02/20/2020   MPG 142.72 02/20/2020   No results found for: PROLACTIN Lab Results  Component Value Date   CHOL 153 04/10/2015   TRIG 166 (H) 04/10/2015   HDL 50 04/10/2015   CHOLHDL 3.1 04/10/2015   VLDL 33 04/10/2015   LDLCALC 70 04/10/2015    Physical Findings: AIMS:  , ,  ,  ,    CIWA:    COWS:     Musculoskeletal: Strength & Muscle Tone: within normal limits Gait & Station: normal Patient leans: N/A  Psychiatric Specialty Exam: Physical Exam Vitals and nursing note reviewed.  Constitutional:      Appearance: He is well-developed.  HENT:     Head: Normocephalic and atraumatic.  Eyes:     Conjunctiva/sclera: Conjunctivae normal.     Pupils: Pupils are equal, round, and reactive to light.  Cardiovascular:     Heart sounds: Normal heart sounds.  Pulmonary:     Effort: Pulmonary effort is normal.  Abdominal:     Palpations: Abdomen is soft.  Musculoskeletal:        General: Normal range of motion.     Cervical back: Normal range of motion.  Skin:    General: Skin is warm and dry.  Neurological:     General: No focal deficit present.     Mental Status: He is alert.  Psychiatric:        Attention and Perception: He is inattentive.        Mood and Affect: Mood is anxious. Affect is labile.        Speech: Speech is rapid and pressured.         Behavior: Behavior is agitated. Behavior is not aggressive.        Thought Content: Thought content does not include homicidal or suicidal ideation.        Cognition and Memory: Cognition is impaired.        Judgment: Judgment is impulsive and inappropriate.     Review of Systems  Constitutional: Negative.   HENT: Negative.   Eyes: Negative.   Respiratory: Negative.   Cardiovascular: Negative.   Gastrointestinal: Negative.   Musculoskeletal: Negative.   Skin: Negative.   Neurological: Negative.   Psychiatric/Behavioral: Positive for dysphoric mood. The patient is nervous/anxious.     Blood pressure 130/81, pulse 78, temperature 98.6 F (37 C), temperature source Oral, resp. rate 18, height 5\' 8"  (1.727 m), weight 104.3 kg, SpO2 98 %.Body mass index is 34.96 kg/m.  General Appearance: Casual  Eye Contact:  Fair  Speech:  Garbled  Volume:  Increased  Mood:  Anxious, Dysphoric and Irritable  Affect:  Congruent  Thought Process:  Disorganized  Orientation:  Full (Time, Place, and Person)  Thought Content:  Tangential  Suicidal Thoughts:  No  Homicidal Thoughts:  No  Memory:  Immediate;   Fair Recent;   Poor Remote;   Fair  Judgement:  Impaired  Insight:  Shallow  Psychomotor Activity:  Decreased  Concentration:  Concentration: Poor  Recall:  Poor  Fund of Knowledge:  Fair  Language:  Fair  Akathisia:  No  Handed:  Right  AIMS (if indicated):     Assets:  Desire for Improvement Resilience  ADL's:  Impaired  Cognition:  Impaired,  Mild  Sleep:  Number of Hours: 7.5     Treatment Plan Summary: Daily contact with patient to assess and evaluate symptoms and progress in treatment, Medication management and Plan Tried today instead of just letting him vent to redirect the conversation to ways that we could try to solve some of his problems.  Talked about some potential solutions to his wanting to watch particular TV shows at home.  I told him I would try to get in touch  with Nami and see if there are any resources available that might allow him to be more active after he is discharged from the hospital possibly even before that.  All of this helped him to calm down a little bit.  I have also restarted his Depakote for now and a total dose of the 1000 mg a day.  Still no sign of any disposition plan  Alethia Berthold, MD 04/13/2020, 2:23 PM

## 2020-04-13 NOTE — [facility] Group Notes (Signed)
[facility] Group Notes:  (Nursing/MHT/Case Management/Adjunct)  Date:  04/13/2020  Time:  9:15 PM  Type of Therapy:  Group Therapy  Participation Level:  Active  Participation Quality:  Redirectable  Affect:  Excited  Cognitive:  Appropriate  Insight:  Good  Engagement in Group:  Engaged and pt. said he got upset today.  Modes of Intervention:  Discussion  Summary of Progress/Problems:  Alexander Beard 04/13/2020, 9:15 PM

## 2020-04-13 NOTE — Progress Notes (Signed)
   04/13/20 1500  Clinical Encounter Type  Visited With Patient  Visit Type Follow-up;Spiritual support;Social support;Behavioral Health  Referral From Nurse  Consult/Referral To Chaplain  Visited with Pt while round unit in [facility]. Pt was telling me that he is doing better and feeling better. He also told me that he was ready to get out of [facility]. Ch will follow-up with Pt.

## 2020-04-14 DIAGNOSIS — F250 Schizoaffective disorder, bipolar type: Secondary | ICD-10-CM | POA: Diagnosis not present

## 2020-04-14 NOTE — Progress Notes (Signed)
D: Pt alert and oriented. Pt denies experiencing any anxiety/depression at this time. Pt denies experiencing any pain at this time. Pt denies experiencing any SI/HI, or AVH at this time.   Pt shares his method of how he remembers how to get back and forth to the medication room and the dayroom. Pt shares his thoughts of how he believes he got overwhelmed and had a panic attack the other day. Pt states that he hates that it happens and that he believes that it's because he gets overwhelm and unable to control his thoughts or word when this happens and is embarrassed by it. Pt wants to be locked in his room with this happens so that he can calm down. This Probation officer reported to the pt that it would be inappropriate for [place] to lock him in his room and that identifying the problem and a coping skill of needing to go to his room and relax was an accomplishment.   A: Scheduled medications administered to pt, per MD orders. Support and encouragement provided. Frequent verbal contact made. Routine safety checks conducted q15 minutes.   R: No adverse drug reactions noted. Pt verbally contracts for safety at this time. Pt complaint with medications and treatment plan. Pt interacts well with others on the unit. Pt remains safe at this time. Will continue to monitor.

## 2020-04-14 NOTE — Progress Notes (Signed)
Recreation Therapy Notes  Date: 04/14/2020  Time: 9:30 am   Location: Craft room   Behavioral response: Appropriate  Intervention Topic: Goals   Discussion/Intervention:  Group content on today was focused on goals. Patients described what goals are and how they define goals. Individuals expressed how they go about setting goals and reaching them. The group identified how important goals are and if they make short term goals to reach long term goals. Patients described how many goals they work on at a time and what affects them not reaching their goal. Individuals described how much time they put into planning and obtaining their goals. The group participated in the intervention "My Goal Board" and made personal goal boards to help them achieve their goal.  Clinical Observations/Feedback: Patient came to group and defined goals as doing artwork because it is therapy. He explained that his goal is to lay down and rest when he is having a panic attack. Participant expressed that his panic attach plan is based off [employer].M.A[employer]T goals. Individual was social with peers and staff while participating in the intervention.  Arnol Mcgibbon LRT/CTRS         Troi Florendo 04/14/2020 11:49 AM

## 2020-04-14 NOTE — [facility] Counselor (Signed)
CSW attempted to reach [facility] at [facility].  CSW left HIPAA compliant voicemail requesting a return call.  Assunta Curtis, MSW, LCSW 04/14/2020 10:45 AM

## 2020-04-14 NOTE — Progress Notes (Signed)
Recreation Therapy Notes   Date: 04/14/2020  Time: 11:00am  Location: Courtyard   Behavioral response: Appropriate  Group Type: Leisure  Participation level: Active  Communication: Patient was social with peers and staff.  Comments: N/A  Daysie Helf LRT/CTRS        Khup Sapia 04/14/2020 11:54 AM

## 2020-04-14 NOTE — [facility] Counselor (Signed)
CSW spoke with Dan Maker in regards to a possible bed.  She asks that CSW call this afternoon after she has had the opportunity to review.  Assunta Curtis, MSW, LCSW 04/14/2020 10:40 AM

## 2020-04-14 NOTE — Progress Notes (Signed)
Patient has been cooperative on shift. Denies SI, HI and AVH. Patient has been medication compliant on shift. No issues to report on shift at this time.

## 2020-04-14 NOTE — Progress Notes (Signed)
[facility] MD Progress Note  04/14/2020 4:55 PM Alexander Beard  MRN:  [id number] Subjective: Follow-up for this patient with schizoaffective disorder and developmental disability.  Patient seen today.  I gave him a lot of praise for the effort he is making today to actually work on his behavior.  On his own he was able to describe some basics of cognitive therapy to me.  He told me that he knows that when he gets overwhelmed with emotion he does behaviors that get him in trouble and that he needs to stop that if he is going to have a better life.  He described for many things that he can do to relax himself and how he wants to work on doing those before he gets out of control.  I told him in clear terms that I was proud of him and thought that that was really wonderful work that he was doing and encouraged him to continue.  His behavior today has been pretty good.  Apparently last night he had a little meltdown which is part of what triggered this today. Principal Problem: Schizoaffective disorder, bipolar type ([place]) Diagnosis: Principal Problem:   Schizoaffective disorder, bipolar type ([place]) Active Problems:   Active autistic disorder   Diabetes ([place])   Hypertension   Prostate hypertrophy   Intellectual disability   At risk for elopement  Total Time spent with patient: 30 minutes  Past Psychiatric History: Lifelong history of disability and illness  Past Medical History:  Past Medical History:  Diagnosis Date  . Anxiety   . Anxiety disorder due to known physiological condition    HOSPITALIZED 10/18  . Arthritis   . Autistic disorder, residual state   . COPD (chronic obstructive pulmonary disease) ([place])   . Depression   . Developmental disorder   . Diabetes mellitus without complication ([place])   . Dyslipidemia   . Esophageal reflux   . HOH (hard of hearing)    MILDLY  . Hypertension   . Obesity   . Overactive bladder   . Palpitations    ANXIETY  . Schizophrenia, schizoaffective ([place])    . Sleep apnea   . Tremors of nervous system    HANDS DUE TO MEDICATIONS  . Urinary incontinence     Past Surgical History:  Procedure Laterality Date  . CATARACT EXTRACTION W/PHACO Left 11/14/2017   Procedure: CATARACT EXTRACTION PHACO AND INTRAOCULAR LENS PLACEMENT (IOC);  Surgeon: Birder Robson, MD;  Location: [facility] ORS;  Service: Ophthalmology;  Laterality: Left;  [place] 00:42 AP% 14.6 CDE 6.12 Fluid pack lot # [id number]  . COLONOSCOPY  01/28/2005  . COLONOSCOPY WITH PROPOFOL N/A 05/09/2016   Procedure: COLONOSCOPY WITH PROPOFOL;  Surgeon: Manya Silvas, MD;  Location: [name] ENDOSCOPY;  Service: Endoscopy;  Laterality: N/A;  . ESOPHAGOGASTRODUODENOSCOPY N/A 05/09/2016   Procedure: ESOPHAGOGASTRODUODENOSCOPY (EGD);  Surgeon: Manya Silvas, MD;  Location: [facility] ENDOSCOPY;  Service: Endoscopy;  Laterality: N/A;  . FRACTURE SURGERY     ORIF SHOULDER  . TONSILLECTOMY     Family History:  Family History  Problem Relation Age of Onset  . Hypertension Mother   . Stroke Father   . Heart Problems Father    Family Psychiatric  History: None Social History:  Social History   Substance and Sexual Activity  Alcohol Use No  . Alcohol/week: 0.0 standard drinks     Social History   Substance and Sexual Activity  Drug Use No    Social History   Socioeconomic History  .  Marital status: Single    Spouse name: Not on file  . Number of children: Not on file  . Years of education: Not on file  . Highest education level: Not on file  Occupational History  . Not on file  Tobacco Use  . Smoking status: Never Smoker  . Smokeless tobacco: Never Used  Vaping Use  . Vaping Use: Never used  Substance and Sexual Activity  . Alcohol use: No    Alcohol/week: 0.0 standard drinks  . Drug use: No  . Sexual activity: Never  Other Topics Concern  . Not on file  Social History Narrative   The patient never finished high school but did get his GED. He works in the past as a Forensic psychologist. He has never been married and has no children. He is currently in disability and [name] is his legal guardian. He has been living in group homes for many years.      No pending legal charges   Social Determinants of Health   Financial Resource Strain:   . Difficulty of Paying Living Expenses: Not on file  Food Insecurity:   . Worried About Charity fundraiser in the Last Year: Not on file  . Ran Out of Food in the Last Year: Not on file  Transportation Needs:   . Lack of Transportation (Medical): Not on file  . Lack of Transportation (Non-Medical): Not on file  Physical Activity:   . Days of Exercise per Week: Not on file  . Minutes of Exercise per Session: Not on file  Stress:   . Feeling of Stress : Not on file  Social Connections:   . Frequency of Communication with Friends and Family: Not on file  . Frequency of Social Gatherings with Friends and Family: Not on file  . Attends Religious Services: Not on file  . Active Member of Clubs or Organizations: Not on file  . Attends Archivist Meetings: Not on file  . Marital Status: Not on file   Additional Social History:                         Sleep: Fair  Appetite:  Fair  Current Medications: Current Facility-Administered Medications  Medication Dose Route Frequency Provider Last Rate Last Admin  . acetaminophen (TYLENOL) tablet 650 mg  650 mg Oral Q6H PRN [name], MD      . alum & mag hydroxide-simeth (MAALOX/MYLANTA) 200-200-20 MG/5ML suspension 30 mL  30 mL Oral Q4H PRN [name], MD      . aspirin EC tablet 81 mg  81 mg Oral Daily Apryl Brymer, [name], MD   81 mg at 04/14/20 0911  . clonazePAM (KLONOPIN) tablet 1 mg  1 mg Oral QID Jennette Leask, [name], MD   1 mg at 04/14/20 1203  . divalproex (DEPAKOTE ER) 24 hr tablet 1,000 mg  1,000 mg Oral QHS Davarion Cuffee T, MD   1,000 mg at 04/13/20 2139  . gabapentin (NEURONTIN) capsule 100 mg  100 mg Oral TID Payne Garske, [name],  MD   100 mg at 04/14/20 1203  . hydrOXYzine (ATARAX/VISTARIL) tablet 10 mg  10 mg Oral TID PRN [name], MD   10 mg at 04/12/20 2595  . influenza vac split quadrivalent PF (FLUARIX) injection 0.5 mL  0.5 mL Intramuscular Tomorrow-1000 Jordynn Marcella T, MD      . lamoTRIgine (LAMICTAL) tablet 100 mg  100 mg Oral QHS  Dyshon Philbin, [name], MD   100 mg at 04/13/20 2139  . linagliptin (TRADJENTA) tablet 5 mg  5 mg Oral Daily Teegan Brandis, [name], MD   5 mg at 04/14/20 0911  . loperamide (IMODIUM) capsule 2 mg  2 mg Oral Q4H PRN [name], MD   2 mg at 04/10/20 0917  . loratadine (CLARITIN) tablet 10 mg  10 mg Oral Daily Lakelyn Straus, [name], MD   10 mg at 04/14/20 0911  . LORazepam (ATIVAN) tablet 2 mg  2 mg Oral Q6H PRN Curties Conigliaro, [name], MD   2 mg at 04/07/20 1240  . magnesium hydroxide (MILK OF MAGNESIA) suspension 30 mL  30 mL Oral Daily PRN [name], MD   30 mL at 03/23/20 1344  . metFORMIN (GLUCOPHAGE) tablet 850 mg  850 mg Oral BID WC Pretty Weltman, [name], MD   850 mg at 04/14/20 0911  . metoprolol succinate (TOPROL-XL) 24 hr tablet 50 mg  50 mg Oral Daily Krishika Bugge, [name], MD   50 mg at 04/14/20 0911  . ondansetron (ZOFRAN) tablet 4 mg  4 mg Oral Q8H PRN Davene Jobin, [name], MD   4 mg at 04/03/20 0909  . oxybutynin (DITROPAN) tablet 10 mg  10 mg Oral BID Kura Bethards, [name], MD   10 mg at 04/14/20 0911  . pantoprazole (PROTONIX) EC tablet 40 mg  40 mg Oral Daily Jamaury Gumz, [name], MD   40 mg at 04/14/20 0912  . prazosin (MINIPRESS) capsule 1 mg  1 mg Oral QHS Hollyann Pablo, [name], MD   1 mg at 04/13/20 2144  . QUEtiapine (SEROQUEL) tablet 100 mg  100 mg Oral QID Amaad Byers T, MD   100 mg at 04/14/20 1203  . QUEtiapine (SEROQUEL) tablet 400 mg  400 mg Oral QHS [name], NP   400 mg at 04/13/20 2144  . sertraline (ZOLOFT) tablet 50 mg  50 mg Oral BID Correna Meacham, [name], MD   50 mg at 04/14/20 0912  . simethicone (MYLICON) chewable tablet 80 mg  80 mg Oral TID Daja Shuping, [name], MD   80 mg at 04/14/20 1203   . simvastatin (ZOCOR) tablet 20 mg  20 mg Oral q1800 Naje Rice, [name], MD   20 mg at 04/13/20 1731  . tamsulosin (FLOMAX) capsule 0.8 mg  0.8 mg Oral QPC supper Cassius Cullinane T, MD   0.8 mg at 04/13/20 1731    Lab Results: No results found for this or any previous visit (from the past 48 hour(s)).  Blood Alcohol level:  Lab Results  Component Value Date   ETH <10 01/22/2020   ETH <10 99/24/2683    Metabolic Disorder Labs: Lab Results  Component Value Date   HGBA1C 6.6 (H) 02/20/2020   MPG 142.72 02/20/2020   No results found for: PROLACTIN Lab Results  Component Value Date   CHOL 153 04/10/2015   TRIG 166 (H) 04/10/2015   HDL 50 04/10/2015   CHOLHDL 3.1 04/10/2015   VLDL 33 04/10/2015   LDLCALC 70 04/10/2015    Physical Findings: AIMS:  , ,  ,  ,    CIWA:    COWS:     Musculoskeletal: Strength & Muscle Tone: within normal limits Gait & Station: normal Patient leans: N/A  Psychiatric Specialty Exam: Physical Exam Vitals and nursing note reviewed.  Constitutional:      Appearance: He is well-developed.  HENT:     Head: Normocephalic and atraumatic.  Eyes:     Conjunctiva/sclera:  Conjunctivae normal.     Pupils: Pupils are equal, round, and reactive to light.  Cardiovascular:     Heart sounds: Normal heart sounds.  Pulmonary:     Effort: Pulmonary effort is normal.  Abdominal:     Palpations: Abdomen is soft.  Musculoskeletal:        General: Normal range of motion.     Cervical back: Normal range of motion.  Skin:    General: Skin is warm and dry.  Neurological:     General: No focal deficit present.     Mental Status: He is alert.  Psychiatric:        Attention and Perception: Attention normal.        Mood and Affect: Mood is anxious.        Speech: Speech normal.        Behavior: Behavior is agitated. Behavior is not aggressive.        Thought Content: Thought content is not paranoid. Thought content does not include homicidal or suicidal  ideation.        Cognition and Memory: Cognition is impaired.        Judgment: Judgment is impulsive.     Review of Systems  Constitutional: Negative.   HENT: Negative.   Eyes: Negative.   Respiratory: Negative.   Cardiovascular: Negative.   Gastrointestinal: Negative.   Musculoskeletal: Negative.   Skin: Negative.   Neurological: Negative.   Psychiatric/Behavioral: The patient is nervous/anxious.     Blood pressure (!) 122/93, pulse 77, temperature 97.8 F (36.6 C), temperature source Oral, resp. rate 18, height 5\' 8"  (1.727 m), weight 104.3 kg, SpO2 100 %.Body mass index is 34.96 kg/m.  General Appearance: Casual  Eye Contact:  Fair  Speech:  Clear and Coherent  Volume:  Increased  Mood:  Anxious  Affect:  Inappropriate  Thought Process:  Coherent  Orientation:  Full (Time, Place, and Person)  Thought Content:  Logical  Suicidal Thoughts:  No  Homicidal Thoughts:  No  Memory:  Immediate;   Fair Recent;   Fair Remote;   Fair  Judgement:  Fair  Insight:  Fair  Psychomotor Activity:  Normal  Concentration:  Concentration: Fair  Recall:  [employer] of Knowledge:  Fair  Language:  Fair  Akathisia:  No  Handed:  Right  AIMS (if indicated):     Assets:  Desire for Improvement Physical Health Resilience  ADL's:  Intact  Cognition:  Impaired,  Mild  Sleep:  Number of Hours: 6     Treatment Plan Summary: Daily contact with patient to assess and evaluate symptoms and progress in treatment, Medication management and Plan I made it clear to [facility] that I thought what he was doing today was just outstanding.  He is working on targeting exactly the kind of problem behaviors that he needs to change.  He is coming up with good ideas on his own and I helped him to flex those out and made some suggestions to him.  No change in medicine and unfortunately still no placement anywhere in sight.  Alethia Berthold, MD 04/14/2020, 4:55 PM

## 2020-04-14 NOTE — Plan of Care (Signed)
Patient stated he was working on his coping skills for when he has a panic attack    Problem: Education: Goal: Emotional status will improve Outcome: Progressing Goal: Mental status will improve Outcome: Progressing

## 2020-04-14 NOTE — Tx Team (Signed)
Interdisciplinary Treatment and Diagnostic Plan Update  04/14/2020 Time of Session: 8:30 AM  Alexander Beard MRN: [id number]  Principal Diagnosis: Schizoaffective disorder, bipolar type ([place])  Secondary Diagnoses: Principal Problem:   Schizoaffective disorder, bipolar type ([place]) Active Problems:   Active autistic disorder   Diabetes ([place])   Hypertension   Prostate hypertrophy   Intellectual disability   At risk for elopement   Current Medications:  Current Facility-Administered Medications  Medication Dose Route Frequency Provider Last Rate Last Admin  . acetaminophen (TYLENOL) tablet 650 mg  650 mg Oral Q6H PRN [name], MD      . alum & mag hydroxide-simeth (MAALOX/MYLANTA) 200-200-20 MG/5ML suspension 30 mL  30 mL Oral Q4H PRN [name], MD      . aspirin EC tablet 81 mg  81 mg Oral Daily [name], [name], MD   81 mg at 04/14/20 0911  . clonazePAM (KLONOPIN) tablet 1 mg  1 mg Oral QID [name], [name], MD   1 mg at 04/14/20 0912  . divalproex (DEPAKOTE ER) 24 hr tablet 1,000 mg  1,000 mg Oral QHS [name], [name], MD   1,000 mg at 04/13/20 2139  . gabapentin (NEURONTIN) capsule 100 mg  100 mg Oral TID [name], [name], MD   100 mg at 04/14/20 0912  . hydrOXYzine (ATARAX/VISTARIL) tablet 10 mg  10 mg Oral TID PRN [name], MD   10 mg at 04/12/20 2595  . influenza vac split quadrivalent PF (FLUARIX) injection 0.5 mL  0.5 mL Intramuscular Tomorrow-1000 [name], [name], MD      . lamoTRIgine (LAMICTAL) tablet 100 mg  100 mg Oral QHS [name], [name], MD   100 mg at 04/13/20 2139  . linagliptin (TRADJENTA) tablet 5 mg  5 mg Oral Daily [name], [name], MD   5 mg at 04/14/20 0911  . loperamide (IMODIUM) capsule 2 mg  2 mg Oral Q4H PRN [name], MD   2 mg at 04/10/20 0917  . loratadine (CLARITIN) tablet 10 mg  10 mg Oral Daily [name], [name], MD   10 mg at 04/14/20 0911  . LORazepam (ATIVAN) tablet 2 mg  2 mg Oral Q6H PRN [name], [name], MD   2 mg at 04/07/20 1240   . magnesium hydroxide (MILK OF MAGNESIA) suspension 30 mL  30 mL Oral Daily PRN [name], MD   30 mL at 03/23/20 1344  . metFORMIN (GLUCOPHAGE) tablet 850 mg  850 mg Oral BID WC [name], [name], MD   850 mg at 04/14/20 0911  . metoprolol succinate (TOPROL-XL) 24 hr tablet 50 mg  50 mg Oral Daily [name], [name], MD   50 mg at 04/14/20 0911  . ondansetron (ZOFRAN) tablet 4 mg  4 mg Oral Q8H PRN [name], [name], MD   4 mg at 04/03/20 0909  . oxybutynin (DITROPAN) tablet 10 mg  10 mg Oral BID [name], [name], MD   10 mg at 04/14/20 0911  . pantoprazole (PROTONIX) EC tablet 40 mg  40 mg Oral Daily [name], [name], MD   40 mg at 04/14/20 0912  . prazosin (MINIPRESS) capsule 1 mg  1 mg Oral QHS [name], [name], MD   1 mg at 04/13/20 2144  . QUEtiapine (SEROQUEL) tablet 100 mg  100 mg Oral QID [name], [name], MD   100 mg at 04/14/20 0912  . QUEtiapine (SEROQUEL) tablet 400 mg  400 mg Oral QHS [name], NP   400  mg at 04/13/20 2144  . sertraline (ZOLOFT) tablet 50 mg  50 mg Oral BID [name], [name], MD   50 mg at 04/14/20 0912  . simethicone (MYLICON) chewable tablet 80 mg  80 mg Oral TID [name], [name], MD   80 mg at 04/14/20 0911  . simvastatin (ZOCOR) tablet 20 mg  20 mg Oral q1800 [name], [name], MD   20 mg at 04/13/20 1731  . tamsulosin (FLOMAX) capsule 0.8 mg  0.8 mg Oral QPC supper [name], [name], MD   0.8 mg at 04/13/20 1731   PTA Medications: Medications Prior to Admission  Medication Sig Dispense Refill Last Dose  . acetaminophen (TYLENOL) 650 MG CR tablet Take 650 mg by mouth every 12 (twelve) hours as needed for pain.     Marland Kitchen aspirin EC 81 MG tablet Take 81 mg by mouth daily.     . Calcium Carbonate-Vitamin D3 (CALCIUM 600-D) 600-400 MG-UNIT TABS Take 1 tablet by mouth 2 (two) times daily.     . Cholecalciferol (VITAMIN D-1000 MAX ST) 1000 UNITS tablet Take 1,000 Units by mouth daily.      Marland Kitchen docusate sodium (COLACE) 100 MG capsule Take 100 mg by mouth daily.      .  furosemide (LASIX) 20 MG tablet Take 20 mg by mouth daily.      Marland Kitchen gabapentin (NEURONTIN) 100 MG capsule Take 1 capsule (100 mg total) by mouth 3 (three) times daily. 90 capsule 10   . Insulin Detemir (LEVEMIR FLEXTOUCH) 100 UNIT/ML Pen Inject 15 Units into the skin daily at 10 pm.     . lamoTRIgine (LAMICTAL) 100 MG tablet Take 1 tablet (100 mg total) by mouth at bedtime. 30 tablet 10   . loratadine (CLARITIN) 10 MG tablet Take 1 tablet (10 mg total) by mouth daily. 30 tablet 5   . LORazepam (ATIVAN) 1 MG tablet Take 1 tablet (1 mg total) by mouth 3 (three) times daily. 90 tablet 5   . lurasidone (LATUDA) 40 MG TABS tablet Take 1 tablet (40 mg total) by mouth daily. with food 30 tablet 10   . metoprolol succinate (TOPROL-XL) 50 MG 24 hr tablet Take 50 mg by mouth daily.      . NON FORMULARY cpap device     . oxybutynin (DITROPAN) 5 MG tablet Take 5 mg by mouth daily.     . pantoprazole (PROTONIX) 40 MG tablet Take 40 mg by mouth daily.     . polyethylene glycol (MIRALAX / GLYCOLAX) packet Take 17 g by mouth daily.     . QUEtiapine (SEROQUEL) 100 MG tablet TAKE 1 TABLET  BY MOUTH THREE TIMES A DAY (Patient taking differently: Take 100 mg by mouth 3 (three) times daily. TAKE 1 TABLET  BY MOUTH THREE TIMES A DAY) 90 tablet 11   . QUEtiapine (SEROQUEL) 400 MG tablet Take 1 tablet (400 mg total) by mouth at bedtime. 30 tablet 10   . sertraline (ZOLOFT) 100 MG tablet Take 2 tablets (200 mg total) by mouth daily. (Patient taking differently: Take 200 mg by mouth daily. Take along with two 50 mg tablets (100 mg) for total 300 mg daily) 60 tablet 5   . sertraline (ZOLOFT) 50 MG tablet Take 2 tablets (100 mg total) by mouth daily. (Patient taking differently: Take 100 mg by mouth daily. Take along with two 100 mg tablets (200 mg) for total 300 mg daily) 60 tablet 5   . simethicone (MYLICON) 80 MG chewable tablet Chew  80-160 mg by mouth 2 (two) times daily as needed for flatulence.     . simvastatin (ZOCOR) 20  MG tablet Take 20 mg by mouth at bedtime.      . sitaGLIPtin (JANUVIA) 100 MG tablet Take 100 mg by mouth daily.     . solifenacin (VESICARE) 5 MG tablet Take 5 mg by mouth daily.     . Starch (HEMORRHOIDAL RE) Place 1 application rectally 4 (four) times daily as needed (for itching).     . tamsulosin (FLOMAX) 0.4 MG CAPS capsule Take 0.4 mg by mouth daily.       Patient Stressors: Health problems Marital or family conflict Medication change or noncompliance Traumatic event  Patient Strengths: Motivation for treatment/growth Religious Affiliation  Treatment Modalities: Medication Management, Group therapy, Case management,  1 to 1 session with clinician, Psychoeducation, Recreational therapy.   Physician Treatment Plan for Primary Diagnosis: Schizoaffective disorder, bipolar type ([place]) Long Term Goal(s): Improvement in symptoms so as ready for discharge Improvement in symptoms so as ready for discharge   Short Term Goals: Ability to verbalize feelings will improve Ability to demonstrate self-control will improve Ability to maintain clinical measurements within normal limits will improve Compliance with prescribed medications will improve  Medication Management: Evaluate patient's response, side effects, and tolerance of medication regimen.  Therapeutic Interventions: 1 to 1 sessions, Unit Group sessions and Medication administration.  Evaluation of Outcomes: Progressing   Physician Treatment Plan for Secondary Diagnosis: Principal Problem:   Schizoaffective disorder, bipolar type ([place]) Active Problems:   Active autistic disorder   Diabetes ([place])   Hypertension   Prostate hypertrophy   Intellectual disability   At risk for elopement  Long Term Goal(s): Improvement in symptoms so as ready for discharge Improvement in symptoms so as ready for discharge   Short Term Goals: Ability to verbalize feelings will improve Ability to demonstrate self-control will improve Ability  to maintain clinical measurements within normal limits will improve Compliance with prescribed medications will improve     Medication Management: Evaluate patient's response, side effects, and tolerance of medication regimen.  Therapeutic Interventions: 1 to 1 sessions, Unit Group sessions and Medication administration.  Evaluation of Outcomes: Progressing   RN Treatment Plan for Primary Diagnosis: Schizoaffective disorder, bipolar type ([place]) Long Term Goal(s): Knowledge of disease and therapeutic regimen to maintain health will improve  Short Term Goals: Ability to demonstrate self-control, Ability to participate in decision making will improve, Ability to verbalize feelings will improve, Ability to disclose and discuss suicidal ideas, Ability to identify and develop effective coping behaviors will improve and Compliance with prescribed medications will improve  Medication Management: RN will administer medications as ordered by provider, will assess and evaluate patient's response and provide education to patient for prescribed medication. RN will report any adverse and/or side effects to prescribing provider.  Therapeutic Interventions: 1 on 1 counseling sessions, Psychoeducation, Medication administration, Evaluate responses to treatment, Monitor vital signs and CBGs as ordered, Perform/monitor CIWA, COWS, AIMS and Fall Risk screenings as ordered, Perform wound care treatments as ordered.  Evaluation of Outcomes: Progressing   LCSW Treatment Plan for Primary Diagnosis: Schizoaffective disorder, bipolar type ([place]) Long Term Goal(s): Safe transition to appropriate next level of care at discharge, Engage patient in therapeutic group addressing interpersonal concerns.  Short Term Goals: Engage patient in aftercare planning with referrals and resources, Increase social support, Increase ability to appropriately verbalize feelings, Increase emotional regulation and Increase skills for  wellness and recovery  Therapeutic Interventions: Assess for  all discharge needs, 1 to 1 time with Education officer, museum, Explore available resources and support systems, Assess for adequacy in community support network, Educate family and significant other(s) on suicide prevention, Complete Psychosocial Assessment, Interpersonal group therapy.  Evaluation of Outcomes: Progressing   Progress in Treatment: Attending groups: Yes. Participating in groups: Yes. Taking medication as prescribed: Yes. Toleration medication: Yes. Family/Significant other contact made: Yes, individual(s) contacted:  [place], brother  Patient understands diagnosis: Yes. Discussing patient identified problems/goals with staff: Yes. Medical problems stabilized or resolved: Yes. Denies suicidal/homicidal ideation: Yes. Issues/concerns per patient self-inventory: No. Other: N/a  New problem(s) identified: No, Describe:  None   New Short Term/Long Term Goal(s): Elimination of symptoms of psychosis, medication management for mood stabilization; elimination of SI thoughts; development of comprehensive mental wellness/sobriety plan.Update 03/01/20:No changes at this time. 03/07/20: Update, no changes at this time Update 03/12/2020: No changes at this time.Update 03/17/2020:No changes at this time. Update 03/21/20: No changes at this time. Update 03/26/20:  No changes at this time. Update 03/31/2020:  No changes at this time. Update 04/04/20: No changes at this time Update 04/09/2020:  No changes at this time.  Update 04/14/2020:  No changes at this time.   Patient Goals:  Patient stated he would like to go back to [place] and reconnect with his peer support. Patient also stated that he would like to increase his social support and help his brother.Update 03/07/20,no change Update 03/12/2020: No changes at this time. Update 03/17/2020:No changes at this time.Update 03/21/20: No changes at this time. Update 03/26/20:  No changes at this  time. Update 03/31/2020:  No changes at this time. Update 04/04/20: No changes at this time.  Update 04/09/2020:  No changes at this time.  Update 04/14/2020:  No changes at this time.  Discharge Plan or Barriers: Patient has an interview with a group home on 03/09/20 at 2:00 PM. Update 03/12/2020: CSW continues to assist the patient in developing a housing plan.Update 03/17/2020:CSW has assisted patient in completing an interview with a group home, however, there has been no word on if the patient has been approved. Patient continues to struggle with his anxiety and nerves. Nurses report that he has increased in bed wetting. Psychiatrist and nurses believe that this may be related to his increased anxiety about finding a home. Update 03/21/20: Patient is still nervous and anxious about finding a new placement. Patient stated he could not stay here too much longer and wanted his life back. Update 03/26/20: CSW continues to look for placement for pt.  Group Homes, [facility] are being considered. Update 03/31/2020:  Patient continues to have interview with group homes, however, no success at this time in identifying a home that has accepted him. Update: 04/04/20: No changes at this time.  Update 04/09/2020:  CSW continues to look for placement for patient.  Update 04/14/2020:  No changes at this time.  CSW continues to look for placement.  Reason for Continuation of Hospitalization: Anxiety Depression Medication stabilization  Estimated Length of Stay: TBD  Attendees: Patient: 04/14/2020 10:45 AM  Physician: [name], MD 04/14/2020 10:45 AM  Nursing:  04/14/2020 10:45 AM  RN Care Manager: 04/14/2020 10:45 AM  Social Worker: Assunta Curtis, LCSW 04/14/2020 10:45 AM  Recreational Therapist:  04/14/2020 10:45 AM  Other: Dr. Domingo Cocking, MD 04/14/2020 10:45 AM  Other:  04/14/2020 10:45 AM  Other: 04/14/2020 10:45 AM    Scribe for Treatment Team: [name],  LCSW  04/14/2020 10:45 AM

## 2020-04-15 DIAGNOSIS — F250 Schizoaffective disorder, bipolar type: Secondary | ICD-10-CM | POA: Diagnosis not present

## 2020-04-15 NOTE — Progress Notes (Signed)
Recreation Therapy Notes   Date: 04/15/2020  Time: 9:30 am   Location: Craft room   Behavioral response: Appropriate  Intervention Topic: Self-esteem   Discussion/Intervention:  Group content today was focused on self-esteem. Patient defined self-esteem and where it comes form. The group described reasons self-esteem is important. Individuals stated things that impact self-esteem and positive ways to improve self-esteem. Patient went over the components of self-esteem. The group participated in the intervention where patients were able to use origami to identify positive ways to improve their self-esteem.    Clinical Observations/Feedback: Patient came to group and stated that self-esteem comes from your heart. He stated that self-esteem is respecting others and their rights. Participant explained that he could improve his self-esteem by treating other people how they should be treated and having pride. Individual was social with peers and staff while participating in the intervention.  Macklen Wilhoite LRT/CTRS         Shenique Childers 04/15/2020 1:06 PM

## 2020-04-15 NOTE — Progress Notes (Signed)
Patientcalm and pleasant this evening denying SI/HI/AVH. 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 Patient compliant with medication administration per MD orders. Patient being monitored Q 15 minutes for safety per unit protocol. Pt remains safe on the unit. Patient given coping skills as he was on the verge of a panic attack. Patient compliant.

## 2020-04-15 NOTE — Progress Notes (Signed)
Patientcalm and pleasant this evening denying SI/HI/AVH. 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 Patient compliant with medication administration per MD orders. Patient being monitored Q 15 minutes for safety per unit protocol. Pt remains safe on the unit.

## 2020-04-15 NOTE — Plan of Care (Signed)
Patient continues to express worries related to placement and reports "I don't know why everyone sees me as an issue....". Patient is anxious but cooperative and denying suicidal/homicidal thoughts. Denies hallucinations.  Received AM medications and breakfast. Emotional support provided. Safety monitored as expected.

## 2020-04-15 NOTE — Progress Notes (Signed)
[facility] MD Progress Note  04/15/2020 5:13 PM Alexander Beard  MRN:  [id number] Subjective: Follow-up patient with developmental disability and schizoaffective disorder.  Feels like he is having a bad day today.  Feeling anxious and depressed.  Not suicidal not homicidal.  Generally not showing any behavior problems.  Still able to show pretty good insight. Principal Problem: Schizoaffective disorder, bipolar type ([place]) Diagnosis: Principal Problem:   Schizoaffective disorder, bipolar type ([place]) Active Problems:   Active autistic disorder   Diabetes ([place])   Hypertension   Prostate hypertrophy   Intellectual disability   At risk for elopement  Total Time spent with patient: 30 minutes  Past Psychiatric History: Longstanding developmental disability  Past Medical History:  Past Medical History:  Diagnosis Date  . Anxiety   . Anxiety disorder due to known physiological condition    HOSPITALIZED 10/18  . Arthritis   . Autistic disorder, residual state   . COPD (chronic obstructive pulmonary disease) ([place])   . Depression   . Developmental disorder   . Diabetes mellitus without complication ([place])   . Dyslipidemia   . Esophageal reflux   . HOH (hard of hearing)    MILDLY  . Hypertension   . Obesity   . Overactive bladder   . Palpitations    ANXIETY  . Schizophrenia, schizoaffective ([place])   . Sleep apnea   . Tremors of nervous system    HANDS DUE TO MEDICATIONS  . Urinary incontinence     Past Surgical History:  Procedure Laterality Date  . CATARACT EXTRACTION W/PHACO Left 11/14/2017   Procedure: CATARACT EXTRACTION PHACO AND INTRAOCULAR LENS PLACEMENT (IOC);  Surgeon: Birder Robson, MD;  Location: [facility] ORS;  Service: Ophthalmology;  Laterality: Left;  [place] 00:42 AP% 14.6 CDE 6.12 Fluid pack lot # [id number]  . COLONOSCOPY  01/28/2005  . COLONOSCOPY WITH PROPOFOL N/A 05/09/2016   Procedure: COLONOSCOPY WITH PROPOFOL;  Surgeon: Manya Silvas, MD;  Location: [facility] ENDOSCOPY;   Service: Endoscopy;  Laterality: N/A;  . ESOPHAGOGASTRODUODENOSCOPY N/A 05/09/2016   Procedure: ESOPHAGOGASTRODUODENOSCOPY (EGD);  Surgeon: Manya Silvas, MD;  Location: [facility] ENDOSCOPY;  Service: Endoscopy;  Laterality: N/A;  . FRACTURE SURGERY     ORIF SHOULDER  . TONSILLECTOMY     Family History:  Family History  Problem Relation Age of Onset  . Hypertension Mother   . Stroke Father   . Heart Problems Father    Family Psychiatric  History: See previous Social History:  Social History   Substance and Sexual Activity  Alcohol Use No  . Alcohol/week: 0.0 standard drinks     Social History   Substance and Sexual Activity  Drug Use No    Social History   Socioeconomic History  . Marital status: Single    Spouse name: Not on file  . Number of children: Not on file  . Years of education: Not on file  . Highest education level: Not on file  Occupational History  . Not on file  Tobacco Use  . Smoking status: Never Smoker  . Smokeless tobacco: Never Used  Vaping Use  . Vaping Use: Never used  Substance and Sexual Activity  . Alcohol use: No    Alcohol/week: 0.0 standard drinks  . Drug use: No  . Sexual activity: Never  Other Topics Concern  . Not on file  Social History Narrative   The patient never finished high school but did get his GED. He works in the past as a Museum/gallery conservator. He  has never been married and has no children. He is currently in disability and [name] is his legal guardian. He has been living in group homes for many years.      No pending legal charges   Social Determinants of Health   Financial Resource Strain:   . Difficulty of Paying Living Expenses: Not on file  Food Insecurity:   . Worried About Charity fundraiser in the Last Year: Not on file  . Ran Out of Food in the Last Year: Not on file  Transportation Needs:   . Lack of Transportation (Medical): Not on file  . Lack of Transportation (Non-Medical): Not on  file  Physical Activity:   . Days of Exercise per Week: Not on file  . Minutes of Exercise per Session: Not on file  Stress:   . Feeling of Stress : Not on file  Social Connections:   . Frequency of Communication with Friends and Family: Not on file  . Frequency of Social Gatherings with Friends and Family: Not on file  . Attends Religious Services: Not on file  . Active Member of Clubs or Organizations: Not on file  . Attends Archivist Meetings: Not on file  . Marital Status: Not on file   Additional Social History:                         Sleep: Fair  Appetite:  Fair  Current Medications: Current Facility-Administered Medications  Medication Dose Route Frequency Provider Last Rate Last Admin  . acetaminophen (TYLENOL) tablet 650 mg  650 mg Oral Q6H PRN [name], MD      . alum & mag hydroxide-simeth (MAALOX/MYLANTA) 200-200-20 MG/5ML suspension 30 mL  30 mL Oral Q4H PRN [name], MD      . aspirin EC tablet 81 mg  81 mg Oral Daily Laquan Beier, [name], MD   81 mg at 04/15/20 0803  . clonazePAM (KLONOPIN) tablet 1 mg  1 mg Oral QID Tuesday Terlecki, [name], MD   1 mg at 04/15/20 1709  . divalproex (DEPAKOTE ER) 24 hr tablet 1,000 mg  1,000 mg Oral QHS Jalayla Chrismer T, MD   1,000 mg at 04/14/20 2119  . gabapentin (NEURONTIN) capsule 100 mg  100 mg Oral TID Natajah Derderian T, MD   100 mg at 04/15/20 1709  . hydrOXYzine (ATARAX/VISTARIL) tablet 10 mg  10 mg Oral TID PRN [name], MD   10 mg at 04/12/20 2956  . influenza vac split quadrivalent PF (FLUARIX) injection 0.5 mL  0.5 mL Intramuscular Tomorrow-1000 Spurgeon Gancarz T, MD      . lamoTRIgine (LAMICTAL) tablet 100 mg  100 mg Oral QHS Carinne Brandenburger T, MD   100 mg at 04/14/20 2120  . linagliptin (TRADJENTA) tablet 5 mg  5 mg Oral Daily Alexa Blish, [name], MD   5 mg at 04/15/20 0803  . loperamide (IMODIUM) capsule 2 mg  2 mg Oral Q4H PRN [name], MD   2 mg at 04/10/20 0917  . loratadine (CLARITIN)  tablet 10 mg  10 mg Oral Daily Kamira Mellette, [name], MD   10 mg at 04/15/20 0803  . LORazepam (ATIVAN) tablet 2 mg  2 mg Oral Q6H PRN Elray Dains, [name], MD   2 mg at 04/07/20 1240  . magnesium hydroxide (MILK OF MAGNESIA) suspension 30 mL  30 mL Oral Daily PRN [name], MD   30 mL at 03/23/20 1344  .  metFORMIN (GLUCOPHAGE) tablet 850 mg  850 mg Oral BID WC Joren Rehm, [name], MD   850 mg at 04/15/20 1709  . metoprolol succinate (TOPROL-XL) 24 hr tablet 50 mg  50 mg Oral Daily Taima Rada, [name], MD   50 mg at 04/15/20 0803  . ondansetron (ZOFRAN) tablet 4 mg  4 mg Oral Q8H PRN Lorry Furber, [name], MD   4 mg at 04/03/20 0909  . oxybutynin (DITROPAN) tablet 10 mg  10 mg Oral BID Cesare Sumlin, [name], MD   10 mg at 04/15/20 1710  . pantoprazole (PROTONIX) EC tablet 40 mg  40 mg Oral Daily Diyan Dave, [name], MD   40 mg at 04/15/20 0803  . prazosin (MINIPRESS) capsule 1 mg  1 mg Oral QHS Crockett Rallo, [name], MD   1 mg at 04/13/20 2144  . QUEtiapine (SEROQUEL) tablet 100 mg  100 mg Oral QID Chante Mayson, [name], MD   100 mg at 04/15/20 1713  . QUEtiapine (SEROQUEL) tablet 400 mg  400 mg Oral QHS [name], NP   400 mg at 04/14/20 2120  . sertraline (ZOLOFT) tablet 50 mg  50 mg Oral BID Jalee Saine, [name], MD   50 mg at 04/15/20 1709  . simethicone (MYLICON) chewable tablet 80 mg  80 mg Oral TID Raijon Lindfors T, MD   80 mg at 04/15/20 1210  . simvastatin (ZOCOR) tablet 20 mg  20 mg Oral q1800 Laasia Arcos, [name], MD   20 mg at 04/15/20 1709  . tamsulosin (FLOMAX) capsule 0.8 mg  0.8 mg Oral QPC supper Neaveh Belanger T, MD   0.8 mg at 04/15/20 1710    Lab Results: No results found for this or any previous visit (from the past 48 hour(s)).  Blood Alcohol level:  Lab Results  Component Value Date   ETH <10 01/22/2020   ETH <10 78/46/9629    Metabolic Disorder Labs: Lab Results  Component Value Date   HGBA1C 6.6 (H) 02/20/2020   MPG 142.72 02/20/2020   No results found for: PROLACTIN Lab Results  Component Value  Date   CHOL 153 04/10/2015   TRIG 166 (H) 04/10/2015   HDL 50 04/10/2015   CHOLHDL 3.1 04/10/2015   VLDL 33 04/10/2015   LDLCALC 70 04/10/2015    Physical Findings: AIMS:  , ,  ,  ,    CIWA:    COWS:     Musculoskeletal: Strength & Muscle Tone: within normal limits Gait & Station: normal Patient leans: N/A  Psychiatric Specialty Exam: Physical Exam Vitals and nursing note reviewed.  Constitutional:      Appearance: He is well-developed.  HENT:     Head: Normocephalic and atraumatic.  Eyes:     Conjunctiva/sclera: Conjunctivae normal.     Pupils: Pupils are equal, round, and reactive to light.  Cardiovascular:     Heart sounds: Normal heart sounds.  Pulmonary:     Effort: Pulmonary effort is normal.  Abdominal:     Palpations: Abdomen is soft.  Musculoskeletal:        General: Normal range of motion.     Cervical back: Normal range of motion.  Skin:    General: Skin is warm and dry.  Neurological:     General: No focal deficit present.     Mental Status: He is alert.  Psychiatric:        Attention and Perception: He is inattentive.        Mood and Affect: Mood is anxious.  Speech: Speech is tangential.        Behavior: Behavior is agitated. Behavior is not aggressive.        Thought Content: Thought content does not include homicidal or suicidal ideation.        Cognition and Memory: Cognition is impaired.     Review of Systems  Blood pressure (!) 141/93, pulse 72, temperature 98.2 F (36.8 C), resp. rate 17, height 5\' 8"  (1.727 m), weight 104.3 kg, SpO2 96 %.Body mass index is 34.96 kg/m.  General Appearance: Casual  Eye Contact:  Good  Speech:  Clear and Coherent  Volume:  Normal  Mood:  Euthymic  Affect:  Congruent  Thought Process:  Goal Directed  Orientation:  Full (Time, Place, and Person)  Thought Content:  Logical  Suicidal Thoughts:  No  Homicidal Thoughts:  No  Memory:  Immediate;   Fair Recent;   Fair Remote;   Fair  Judgement:   Fair  Insight:  Fair  Psychomotor Activity:  Normal  Concentration:  Concentration: Fair  Recall:  [employer] of Knowledge:  Fair  Language:  Fair  Akathisia:  No  Handed:  Right  AIMS (if indicated):     Assets:  Desire for Improvement  ADL's:  Impaired  Cognition:  Impaired,  Mild  Sleep:  Number of Hours: 5.5     Treatment Plan Summary: Daily contact with patient to assess and evaluate symptoms and progress in treatment, Medication management and Plan No change to medicine.  Lots of supportive therapy and counseling.  Vitals stable.  Patient is medically stable.  Looking forward to hoping we can find some kind of discharge plan before too [place], MD 04/15/2020, 5:13 PM

## 2020-04-15 NOTE — Plan of Care (Signed)
Patient presents at his baseline  Problem: Education: Goal: Emotional status will improve Outcome: Not Progressing Goal: Mental status will improve Outcome: Not Progressing   

## 2020-04-16 DIAGNOSIS — F250 Schizoaffective disorder, bipolar type: Secondary | ICD-10-CM | POA: Diagnosis not present

## 2020-04-16 DIAGNOSIS — F0390 Unspecified dementia without behavioral disturbance: Secondary | ICD-10-CM

## 2020-04-16 NOTE — Plan of Care (Signed)
  Problem: Group Participation °Goal: STG - Patient will engage in groups with a calm and appropriate mood at least 2x within 5 recreation therapy group sessions °Description: STG - Patient will engage in groups with a calm and appropriate mood at least 2x within 5 recreation therapy group sessions °Outcome: Progressing °  °

## 2020-04-16 NOTE — BHH Counselor (Signed)
Concerned because of wandering.  [place]  He reports that there is a lot of traffic nearby as well.  He reports that he wants to mull over if pt will be appropriate.    Assunta Curtis, MSW, LCSW 04/16/2020 4:39 PM

## 2020-04-16 NOTE — BHH Counselor (Signed)
CSW contacted the following in an effort to identify possible placement for the patient:   [facility] [place] [place] group home.   [facility], [phone] No male beds available.   [facility] No group homes listed.   [facility] No group homes listed.   [place] No group homes listed.   [place] No group homes listed.   [place] group homes listed.    [facility], [phone] No male beds available.   [place] [facility], [phone]  Line rang incessantly.  CSW unable to leave HIPAA compliant voicemail.  [facility], [phone] Line rang until it began to sound like a fax machine.   [facility], [phone]  Voicemail box is full.   [facility], [phone] CSW left HIPAA compliant voicemail.   [facility] No group homes listed.   [facility], [phone]  Line rang until it began to sound like a fax machine.  [place], [phone]  CSW left HIPAA compliant voicemail.  [facility], [phone]  Voicemail box is full.  [facility] No group homes listed.  [facility], [phone] CSW was asked to speak with Mr. Tobin Chad at [phone].  CSW called Mr. Drenda Freeze who reports that CSW needs to follow up with [facility] as this is a part of [place] homes.   [place], [phone] Line rang then message to enter member ID number.  [facility], [phone]  Line rang incessantly.  CSW unable to leave HIPAA compliant voicemail.  [place], [phone]  Line rang incessantly.  CSW unable to leave HIPAA compliant voicemail.  Assunta Curtis, MSW, LCSW 04/16/2020 12:21 PM

## 2020-04-16 NOTE — Progress Notes (Signed)
Patientcalm and pleasant this evening denying SI/HI/AVH. Patientpreoccupied withhis artwork but didn't have a panic attack this evening.Patient given support and encouragement. Patient compliant with medication administration per MD orders. Patient being monitored Q 15 minutes for safety per unit protocol. Pt remains safe on the unit. Patient given coping skills as he was on the verge of a panic attack. Patient compliant.

## 2020-04-16 NOTE — Progress Notes (Signed)
BRIEF PHARMACY NOTE  This patient attended and participated in Medication Management Group counseling led by [facility] staff pharmacist.  This interactive class reviews basic information about prescription medications and education on personal responsibility in medication management.  The class also includes general knowledge of 3 main classes of behavioral medications, including antipsychotics, antidepressants, and mood stabilizers.     Patient behavior was appropriate for group setting.  Educational materials sourced from:  "Medication Do's and Don'ts" from [email or web address]   "Mental Health Medications" from [email or web address] [id number]   Benita Gutter 04/16/2020 , 3:52 PM

## 2020-04-16 NOTE — Progress Notes (Signed)
Recreation Therapy Notes   Date: 04/16/2020  Time: 9:30 am   Location: Craft room   Behavioral response: Appropriate  Intervention Topic: Anger-Management   Discussion/Intervention:  Group content on today was focused on anger management. The group defined anger and reasons they become angry. Individuals expressed negative way they have dealt with anger in the past. Patients stated some positive ways they could deal with anger in the future. The group described how anger can affect your health and daily plans. Individuals participated in the intervention "Score your anger" where they had a chance to answer questions about themselves and get a score of their anger.   Clinical Observations/Feedback: Patient came to group and explained that he has trouble with his temper and saying the wrong thing. He expressed that people not letting him have his freedom is what has been making him angry. Participant described participating in a game of corn hole and listening to music; is what helps him manage his anger. Individual was social with peers and staff while participating in the intervention.  Saidi Santacroce LRT/CTRS            Cathy Ropp 04/16/2020 1:25 PM

## 2020-04-16 NOTE — Plan of Care (Signed)
Patient presents at his baseline  Problem: Education: Goal: Emotional status will improve Outcome: Not Progressing Goal: Mental status will improve Outcome: Not Progressing   

## 2020-04-16 NOTE — Progress Notes (Signed)
D: Pt alert and oriented. Pt rates depression 0/10, hopelessness 0/10, and anxiety 10/10. Pt goal: "Enjoying myself so that I can help others..."  Pt reports energy level as hyper and concentration as being poor. Pt reports sleep last night as being good. Pt did not receive medications for sleep. Pt denies experiencing any pain at this time. Pt denies experiencing any SI/HI, or AVH at this time.   Pt's anxiety is much improved today. Pt still observed to have some confusion. Pt has been calm and cooperative overall.  A: Scheduled medications administered to pt, per MD orders. Support and encouragement provided. Frequent verbal contact made. Routine safety checks conducted q15 minutes.   R: No adverse drug reactions noted. Pt verbally contracts for safety at this time. Pt complaint with medications and treatment plan. Pt interacts well with others on the unit. Pt remains safe at this time. Will continue to monitor.

## 2020-04-16 NOTE — Progress Notes (Signed)
[facility] MD Progress Note  04/16/2020 1:22 PM Alexander Beard  MRN:  [id number] Subjective: Follow-up for this gentleman describes several times previously with chronic mood instability and behavior problems with a diagnosis of schizoaffective disorder and likely longstanding developmental disability.  Patient seen chart reviewed.  Patient was in his room today laying on his back and when I came in to speak with him he said that he was having a bad day because his thoughts were overwhelming him.  He had "too many thoughts in my mind".  Nothing explicitly psychotic.  Anxious.  Open however to being talked down and discussing coping skills.  No new physical complaints. Principal Problem: Dementia ([place]) Diagnosis: Principal Problem:   Dementia ([place]) Active Problems:   Schizoaffective disorder, bipolar type ([place])   Active autistic disorder   Diabetes ([place])   Hypertension   Prostate hypertrophy   Intellectual disability   At risk for elopement  Total Time spent with patient: 30 minutes  Past Psychiatric History: See multiple prior notes.  Longstanding mental health issues  Past Medical History:  Past Medical History:  Diagnosis Date  . Anxiety   . Anxiety disorder due to known physiological condition    HOSPITALIZED 10/18  . Arthritis   . Autistic disorder, residual state   . COPD (chronic obstructive pulmonary disease) ([place])   . Depression   . Developmental disorder   . Diabetes mellitus without complication ([place])   . Dyslipidemia   . Esophageal reflux   . HOH (hard of hearing)    MILDLY  . Hypertension   . Obesity   . Overactive bladder   . Palpitations    ANXIETY  . Schizophrenia, schizoaffective ([place])   . Sleep apnea   . Tremors of nervous system    HANDS DUE TO MEDICATIONS  . Urinary incontinence     Past Surgical History:  Procedure Laterality Date  . CATARACT EXTRACTION W/PHACO Left 11/14/2017   Procedure: CATARACT EXTRACTION PHACO AND INTRAOCULAR LENS PLACEMENT (IOC);   Surgeon: Birder Robson, MD;  Location: [facility] ORS;  Service: Ophthalmology;  Laterality: Left;  [place] 00:42 AP% 14.6 CDE 6.12 Fluid pack lot # [id number]  . COLONOSCOPY  01/28/2005  . COLONOSCOPY WITH PROPOFOL N/A 05/09/2016   Procedure: COLONOSCOPY WITH PROPOFOL;  Surgeon: Manya Silvas, MD;  Location: [facility] ENDOSCOPY;  Service: Endoscopy;  Laterality: N/A;  . ESOPHAGOGASTRODUODENOSCOPY N/A 05/09/2016   Procedure: ESOPHAGOGASTRODUODENOSCOPY (EGD);  Surgeon: Manya Silvas, MD;  Location: [facility] ENDOSCOPY;  Service: Endoscopy;  Laterality: N/A;  . FRACTURE SURGERY     ORIF SHOULDER  . TONSILLECTOMY     Family History:  Family History  Problem Relation Age of Onset  . Hypertension Mother   . Stroke Father   . Heart Problems Father    Family Psychiatric  History: None Social History:  Social History   Substance and Sexual Activity  Alcohol Use No  . Alcohol/week: 0.0 standard drinks     Social History   Substance and Sexual Activity  Drug Use No    Social History   Socioeconomic History  . Marital status: Single    Spouse name: Not on file  . Number of children: Not on file  . Years of education: Not on file  . Highest education level: Not on file  Occupational History  . Not on file  Tobacco Use  . Smoking status: Never Smoker  . Smokeless tobacco: Never Used  Vaping Use  . Vaping Use: Never used  Substance and Sexual  Activity  . Alcohol use: No    Alcohol/week: 0.0 standard drinks  . Drug use: No  . Sexual activity: Never  Other Topics Concern  . Not on file  Social History Narrative   The patient never finished high school but did get his GED. 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 He is currently in disability and [name] is his legal guardian. He has been living in group homes for many years.      No pending legal charges   Social Determinants of Health   Financial Resource Strain:    . Difficulty of Paying Living Expenses: Not on file  Food Insecurity:   . Worried About Charity fundraiser in the Last Year: Not on file  . Ran Out of Food in the Last Year: Not on file  Transportation Needs:   . Lack of Transportation (Medical): Not on file  . Lack of Transportation (Non-Medical): Not on file  Physical Activity:   . Days of Exercise per Week: Not on file  . Minutes of Exercise per Session: Not on file  Stress:   . Feeling of Stress : Not on file  Social Connections:   . Frequency of Communication with Friends and Family: Not on file  . Frequency of Social Gatherings with Friends and Family: Not on file  . Attends Religious Services: Not on file  . Active Member of Clubs or Organizations: Not on file  . Attends Archivist Meetings: Not on file  . Marital Status: Not on file   Additional Social History:                         Sleep: Fair  Appetite:  Fair  Current Medications: Current Facility-Administered Medications  Medication Dose Route Frequency Provider Last Rate Last Admin  . acetaminophen (TYLENOL) tablet 650 mg  650 mg Oral Q6H PRN [name], MD      . alum & mag hydroxide-simeth (MAALOX/MYLANTA) 200-200-20 MG/5ML suspension 30 mL  30 mL Oral Q4H PRN [name], MD      . aspirin EC tablet 81 mg  81 mg Oral Daily Luz Mares, [name], MD   81 mg at 04/16/20 1914  . clonazePAM (KLONOPIN) tablet 1 mg  1 mg Oral QID Aniket Paye, [name], MD   1 mg at 04/16/20 1156  . divalproex (DEPAKOTE ER) 24 hr tablet 1,000 mg  1,000 mg Oral QHS Danyel Tobey T, MD   1,000 mg at 04/15/20 2135  . gabapentin (NEURONTIN) capsule 100 mg  100 mg Oral TID Khori Rosevear T, MD   100 mg at 04/16/20 1156  . hydrOXYzine (ATARAX/VISTARIL) tablet 10 mg  10 mg Oral TID PRN [name], MD   10 mg at 04/16/20 0904  . influenza vac split quadrivalent PF (FLUARIX) injection 0.5 mL  0.5 mL Intramuscular Tomorrow-1000 Nada Godley T, MD      . lamoTRIgine  (LAMICTAL) tablet 100 mg  100 mg Oral QHS Marbeth Smedley T, MD   100 mg at 04/15/20 2135  . linagliptin (TRADJENTA) tablet 5 mg  5 mg Oral Daily Zakariah Dejarnette, [name], MD   5 mg at 04/16/20 0808  . loperamide (IMODIUM) capsule 2 mg  2 mg Oral Q4H PRN [name], MD   2 mg at 04/10/20 0917  . loratadine (CLARITIN) tablet 10 mg  10 mg Oral Daily Juliauna Stueve  T, MD   10 mg at 04/16/20 0809  . LORazepam (ATIVAN) tablet 2 mg  2 mg Oral Q6H PRN Jossette Zirbel, [name], MD   2 mg at 04/07/20 1240  . magnesium hydroxide (MILK OF MAGNESIA) suspension 30 mL  30 mL Oral Daily PRN [name], MD   30 mL at 03/23/20 1344  . metFORMIN (GLUCOPHAGE) tablet 850 mg  850 mg Oral BID WC Shaleka Brines, [name], MD   850 mg at 04/16/20 0809  . metoprolol succinate (TOPROL-XL) 24 hr tablet 50 mg  50 mg Oral Daily Jetta Murray, [name], MD   50 mg at 04/16/20 0808  . ondansetron (ZOFRAN) tablet 4 mg  4 mg Oral Q8H PRN Valbona Slabach, [name], MD   4 mg at 04/03/20 0909  . oxybutynin (DITROPAN) tablet 10 mg  10 mg Oral BID Glennon Kopko, [name], MD   10 mg at 04/16/20 0809  . pantoprazole (PROTONIX) EC tablet 40 mg  40 mg Oral Daily Darry Kelnhofer, [name], MD   40 mg at 04/16/20 5956  . prazosin (MINIPRESS) capsule 1 mg  1 mg Oral QHS Caelin Rayl T, MD   1 mg at 04/15/20 2135  . QUEtiapine (SEROQUEL) tablet 100 mg  100 mg Oral QID Alajia Schmelzer T, MD   100 mg at 04/16/20 1156  . QUEtiapine (SEROQUEL) tablet 400 mg  400 mg Oral QHS [name], NP   400 mg at 04/15/20 2135  . sertraline (ZOLOFT) tablet 50 mg  50 mg Oral BID Artina Minella, [name], MD   50 mg at 04/16/20 0809  . simethicone (MYLICON) chewable tablet 80 mg  80 mg Oral TID Nyra Anspaugh, [name], MD   80 mg at 04/16/20 1156  . simvastatin (ZOCOR) tablet 20 mg  20 mg Oral q1800 Anselma Herbel, [name], MD   20 mg at 04/15/20 1709  . tamsulosin (FLOMAX) capsule 0.8 mg  0.8 mg Oral QPC supper Neri Vieyra T, MD   0.8 mg at 04/15/20 1710    Lab Results: No results found for this or any previous visit (from the  past 48 hour(s)).  Blood Alcohol level:  Lab Results  Component Value Date   ETH <10 01/22/2020   ETH <10 38/75/6433    Metabolic Disorder Labs: Lab Results  Component Value Date   HGBA1C 6.6 (H) 02/20/2020   MPG 142.72 02/20/2020   No results found for: PROLACTIN Lab Results  Component Value Date   CHOL 153 04/10/2015   TRIG 166 (H) 04/10/2015   HDL 50 04/10/2015   CHOLHDL 3.1 04/10/2015   VLDL 33 04/10/2015   LDLCALC 70 04/10/2015    Physical Findings: AIMS:  , ,  ,  ,    CIWA:    COWS:     Musculoskeletal: Strength & Muscle Tone: within normal limits Gait & Station: normal Patient leans: N/A  Psychiatric Specialty Exam: Physical Exam Vitals and nursing note reviewed.  Constitutional:      Appearance: He is well-developed.  HENT:     Head: Normocephalic and atraumatic.  Eyes:     Conjunctiva/sclera: Conjunctivae normal.     Pupils: Pupils are equal, round, and reactive to light.  Cardiovascular:     Heart sounds: Normal heart sounds.  Pulmonary:     Effort: Pulmonary effort is normal.  Abdominal:     Palpations: Abdomen is soft.  Musculoskeletal:        General: Normal range of motion.     Cervical back: Normal range of motion.  Skin:    General: Skin is warm and dry.  Neurological:     General: No focal deficit present.     Mental Status: He is alert.  Psychiatric:        Attention and Perception: He is inattentive.        Mood and Affect: Mood is anxious.        Speech: Speech is rapid and pressured.        Behavior: Behavior is agitated. Behavior is not aggressive.        Thought Content: Thought content is not paranoid or delusional. Thought content does not include homicidal or suicidal ideation.        Cognition and Memory: Cognition is impaired.        Judgment: Judgment is impulsive.     Review of Systems  Constitutional: Negative.   HENT: Negative.   Eyes: Negative.   Respiratory: Negative.   Cardiovascular: Negative.    Gastrointestinal: Negative.   Musculoskeletal: Negative.   Skin: Negative.   Neurological: Negative.   Psychiatric/Behavioral: Positive for confusion and dysphoric mood. Negative for suicidal ideas. The patient is nervous/anxious.     Blood pressure 125/74, pulse 70, temperature 98.2 F (36.8 C), temperature source Oral, resp. rate 18, height 5\' 8"  (1.727 m), weight 104.3 kg, SpO2 98 %.Body mass index is 34.96 kg/m.  General Appearance: Casual  Eye Contact:  Fair  Speech:  Pressured  Volume:  Normal  Mood:  Anxious and Dysphoric  Affect:  Congruent  Thought Process:  Disorganized  Orientation:  Full (Time, Place, and Person)  Thought Content:  Rumination and Tangential  Suicidal Thoughts:  No  Homicidal Thoughts:  No  Memory:  Immediate;   Fair Recent;   Poor Remote;   Poor  Judgement:  Impaired  Insight:  Shallow  Psychomotor Activity:  Normal  Concentration:  Concentration: Fair  Recall:  [employer] of Knowledge:  Fair  Language:  Fair  Akathisia:  No  Handed:  Right  AIMS (if indicated):     Assets:  Desire for Improvement Resilience  ADL's:  Impaired  Cognition:  Impaired,  Mild  Sleep:  Number of Hours: 8.25     Treatment Plan Summary: Daily contact with patient to assess and evaluate symptoms and progress in treatment, Medication management and Plan Order done to check Depakote level tomorrow.  No change to medicine today.  Supportive counseling and encouragement to keep on working on his coping skills.  Alethia Berthold, MD 04/16/2020, 1:22 PM

## 2020-04-17 DIAGNOSIS — F250 Schizoaffective disorder, bipolar type: Secondary | ICD-10-CM | POA: Diagnosis not present

## 2020-04-17 LAB — VALPROIC ACID LEVEL: Valproic Acid Lvl: 53 ug/mL (ref 50.0–100.0)

## 2020-04-17 MED ORDER — DIVALPROEX SODIUM ER 500 MG PO TB24
1500.0000 mg | ORAL_TABLET | Freq: Every day | ORAL | Status: DC
  Administered 2020-04-17 – 2020-06-07 (×51): 1500 mg via ORAL
  Filled 2020-04-17 (×51): qty 3

## 2020-04-17 NOTE — Progress Notes (Signed)
Patient ID: Alexander Beard, male   DOB: September 01, 1955, 64 y.o.   MRN: [id number] After dictating the previous note I realized we had ordered a Depakote level today.  Level came back at 17 which is on the low side.  Dose will be increased to 1500 mg at night

## 2020-04-17 NOTE — Progress Notes (Signed)
Recreation Therapy Notes  Date: 04/17/2020  Time: 9:30 am   Location: Craft room   Behavioral response: Appropriate  Intervention Topic: Communication   Discussion/Intervention:  Group content today was focused on communication. The group defined communication and ways to communicate with others. Individuals stated reason why communication is important and some reasons to communicate with others. Patients expressed if they thought they were good at communicating with others and ways they could improve their communication skills. The group identified important parts of communication and some experiences they have had in the past with communication. The group participated in the intervention "What is that?", where they had a chance to test out their communication skills and identify ways to improve their communication techniques.  Clinical Observations/Feedback: Patient came to group and defined communication as using the right words. He stated that he likes using face to face communication. Participant expressed that communication is important to help resolve conflict and corrects problems. Individual was social with peers and staff while participating in the intervention.  Enis Riecke LRT/CTRS         Delmi Fulfer 04/17/2020 2:48 PM

## 2020-04-17 NOTE — [facility] Counselor (Signed)
CSW assisted the patient in completing a interview with Dan Maker. CSW will follow up the following week.  Assunta Curtis, MSW, LCSW 04/17/2020 4:35 PM

## 2020-04-17 NOTE — Progress Notes (Signed)
Patient came up to the nursing station stating, "I feel like I am going to vomit. I haven't yet but I feel like I can." Patient educated to go back to his room and this nurse would give him a PRN medication for nausea. During assessment patient stated, "I am having racing thoughts and I think that my anxiety is what is making me sick." Patient given support and encouragement along with PRN medications.

## 2020-04-17 NOTE — Progress Notes (Signed)
Recreation Therapy Notes   Date: 04/17/2020  Time: 1:00pm   Location: Courtyard   Behavioral response: Appropriate  Group Type: Leisure  Participation level: Active  Communication: Patient was social with peers and staff.  Comments: N/A  Moroni Nester LRT/CTRS        Sadiyah Kangas 04/17/2020 3:50 PM

## 2020-04-17 NOTE — Progress Notes (Signed)
[facility] MD Progress Note   04/17/2020 1:31 PM Alexander Beard  MRN:  [id number] Subjective: Patient appears to be having a good day today.  Mood is better.  He is calm and has not been agitated today.  Looking forward to his interview for a possible group home. Principal Problem: Dementia ([place]) Diagnosis: Principal Problem:   Dementia ([place]) Active Problems:   Schizoaffective disorder, bipolar type ([place])   Active autistic disorder   Diabetes ([place])   Hypertension   Prostate hypertrophy   Intellectual disability   At risk for elopement  Total Time spent with patient: 30 minutes  Past Psychiatric History: Longstanding mental disability  Past Medical History:  Past Medical History:  Diagnosis Date  . Anxiety   . Anxiety disorder due to known physiological condition    HOSPITALIZED 10/18  . Arthritis   . Autistic disorder, residual state   . COPD (chronic obstructive pulmonary disease) ([place])   . Depression   . Developmental disorder   . Diabetes mellitus without complication ([place])   . Dyslipidemia   . Esophageal reflux   . HOH (hard of hearing)    MILDLY  . Hypertension   . Obesity   . Overactive bladder   . Palpitations    ANXIETY  . Schizophrenia, schizoaffective ([name])   . Sleep apnea   . Tremors of nervous system    HANDS DUE TO MEDICATIONS  . Urinary incontinence     Past Surgical History:  Procedure Laterality Date  . CATARACT EXTRACTION W/PHACO Left 11/14/2017   Procedure: CATARACT EXTRACTION PHACO AND INTRAOCULAR LENS PLACEMENT (IOC);  Surgeon: Birder Robson, MD;  Location: [facility] ORS;  Service: Ophthalmology;  Laterality: Left;  [place] 00:42 AP% 14.6 CDE 6.12 Fluid pack lot # [id number]  . COLONOSCOPY  01/28/2005  . COLONOSCOPY WITH PROPOFOL N/A 05/09/2016   Procedure: COLONOSCOPY WITH PROPOFOL;  Surgeon: Manya Silvas, MD;  Location: [facility] ENDOSCOPY;  Service: Endoscopy;  Laterality: N/A;  . ESOPHAGOGASTRODUODENOSCOPY N/A 05/09/2016   Procedure:  ESOPHAGOGASTRODUODENOSCOPY (EGD);  Surgeon: Manya Silvas, MD;  Location: [facility] ENDOSCOPY;  Service: Endoscopy;  Laterality: N/A;  . FRACTURE SURGERY     ORIF SHOULDER  . TONSILLECTOMY     Family History:  Family History  Problem Relation Age of Onset  . Hypertension Mother   . Stroke Father   . Heart Problems Father    Family Psychiatric  History: See previous Social History:  Social History   Substance and Sexual Activity  Alcohol Use No  . Alcohol/week: 0.0 standard drinks     Social History   Substance and Sexual Activity  Drug Use No    Social History   Socioeconomic History  . Marital status: Single    Spouse name: Not on file  . Number of children: Not on file  . Years of education: Not on file  . Highest education level: Not on file  Occupational History  . Not on file  Tobacco Use  . Smoking status: Never Smoker  . Smokeless tobacco: Never Used  Vaping Use  . Vaping Use: Never used  Substance and Sexual Activity  . Alcohol use: No    Alcohol/week: 0.0 standard drinks  . Drug use: No  . Sexual activity: Never  Other Topics Concern  . Not on file  Social History Narrative   The patient never finished high school but did get his GED. He works in the past as a Museum/gallery conservator. He has never been married and has no  children. He is currently in disability and [name] is his legal guardian. He has been living in group homes for many years.      No pending legal charges   Social Determinants of Health   Financial Resource Strain:   . Difficulty of Paying Living Expenses: Not on file  Food Insecurity:   . Worried About Charity fundraiser in the Last Year: Not on file  . Ran Out of Food in the Last Year: Not on file  Transportation Needs:   . Lack of Transportation (Medical): Not on file  . Lack of Transportation (Non-Medical): Not on file  Physical Activity:   . Days of Exercise per Week: Not on file  . Minutes of Exercise per  Session: Not on file  Stress:   . Feeling of Stress : Not on file  Social Connections:   . Frequency of Communication with Friends and Family: Not on file  . Frequency of Social Gatherings with Friends and Family: Not on file  . Attends Religious Services: Not on file  . Active Member of Clubs or Organizations: Not on file  . Attends Archivist Meetings: Not on file  . Marital Status: Not on file   Additional Social History:                         Sleep: Fair  Appetite:  Fair  Current Medications: Current Facility-Administered Medications  Medication Dose Route Frequency Provider Last Rate Last Admin  . acetaminophen (TYLENOL) tablet 650 mg  650 mg Oral Q6H PRN [name], MD      . alum & mag hydroxide-simeth (MAALOX/MYLANTA) 200-200-20 MG/5ML suspension 30 mL  30 mL Oral Q4H PRN [name], MD      . aspirin EC tablet 81 mg  81 mg Oral Daily Trequan Marsolek, [name], MD   81 mg at 04/17/20 0756  . clonazePAM (KLONOPIN) tablet 1 mg  1 mg Oral QID Roshonda Sperl, [name], MD   1 mg at 04/17/20 1224  . divalproex (DEPAKOTE ER) 24 hr tablet 1,000 mg  1,000 mg Oral QHS Mechel Haggard T, MD   1,000 mg at 04/16/20 2106  . gabapentin (NEURONTIN) capsule 100 mg  100 mg Oral TID Janecia Palau T, MD   100 mg at 04/17/20 1224  . hydrOXYzine (ATARAX/VISTARIL) tablet 10 mg  10 mg Oral TID PRN [name], MD   10 mg at 04/16/20 0904  . influenza vac split quadrivalent PF (FLUARIX) injection 0.5 mL  0.5 mL Intramuscular Tomorrow-1000 Donnamaria Shands T, MD      . lamoTRIgine (LAMICTAL) tablet 100 mg  100 mg Oral QHS Kynan Peasley T, MD   100 mg at 04/16/20 2106  . linagliptin (TRADJENTA) tablet 5 mg  5 mg Oral Daily Julus Kelley, [name], MD   5 mg at 04/17/20 0802  . loperamide (IMODIUM) capsule 2 mg  2 mg Oral Q4H PRN [name], MD   2 mg at 04/10/20 0917  . loratadine (CLARITIN) tablet 10 mg  10 mg Oral Daily Bret Vanessen, [name], MD   10 mg at 04/17/20 0806  . LORazepam (ATIVAN)  tablet 2 mg  2 mg Oral Q6H PRN Alondra Vandeven, [name], MD   2 mg at 04/07/20 1240  . magnesium hydroxide (MILK OF MAGNESIA) suspension 30 mL  30 mL Oral Daily PRN [name], MD   30 mL at 03/23/20 1344  . metFORMIN (GLUCOPHAGE) tablet 850 mg  850 mg Oral BID WC Sharlet Notaro, [name], MD   850 mg at 04/17/20 0802  . metoprolol succinate (TOPROL-XL) 24 hr tablet 50 mg  50 mg Oral Daily Rayman Petrosian, [name], MD   50 mg at 04/17/20 0757  . ondansetron (ZOFRAN) tablet 4 mg  4 mg Oral Q8H PRN Oather Muilenburg, [name], MD   4 mg at 04/17/20 0344  . oxybutynin (DITROPAN) tablet 10 mg  10 mg Oral BID Rebecka Oelkers, [name], MD   10 mg at 04/17/20 0802  . pantoprazole (PROTONIX) EC tablet 40 mg  40 mg Oral Daily Karsen Fellows, [name], MD   40 mg at 04/17/20 0802  . prazosin (MINIPRESS) capsule 1 mg  1 mg Oral QHS Oneil Behney T, MD   1 mg at 04/15/20 2135  . QUEtiapine (SEROQUEL) tablet 100 mg  100 mg Oral QID Aylyn Wenzler T, MD   100 mg at 04/17/20 1224  . QUEtiapine (SEROQUEL) tablet 400 mg  400 mg Oral QHS [name], NP   400 mg at 04/16/20 2106  . sertraline (ZOLOFT) tablet 50 mg  50 mg Oral BID Jaclin Finks, [name], MD   50 mg at 04/17/20 0801  . simethicone (MYLICON) chewable tablet 80 mg  80 mg Oral TID Kc Summerson, [name], MD   80 mg at 04/17/20 1225  . simvastatin (ZOCOR) tablet 20 mg  20 mg Oral q1800 Heston Widener, [name], MD   20 mg at 04/16/20 1701  . tamsulosin (FLOMAX) capsule 0.8 mg  0.8 mg Oral QPC supper Blade Scheff, [name], MD   0.8 mg at 04/16/20 1700    Lab Results:  Results for orders placed or performed during the hospital encounter of 02/19/20 (from the past 48 hour(s))  Valproic acid level     Status: None   Collection Time: 04/17/20  6:29 AM  Result Value Ref Range   Valproic Acid Lvl 53 50.0 - 100.0 ug/mL    Comment: Performed at [facility], [address] [place], [place] [address]    Blood Alcohol level:  Lab Results  Component Value Date   [facility] <10 01/22/2020   ETH <10 29/51/8841     Metabolic Disorder Labs: Lab Results  Component Value Date   HGBA1C 6.6 (H) 02/20/2020   MPG 142.72 02/20/2020   No results found for: PROLACTIN Lab Results  Component Value Date   CHOL 153 04/10/2015   TRIG 166 (H) 04/10/2015   HDL 50 04/10/2015   CHOLHDL 3.1 04/10/2015   VLDL 33 04/10/2015   LDLCALC 70 04/10/2015    Physical Findings: AIMS:  , ,  ,  ,    CIWA:    COWS:     Musculoskeletal: Strength & Muscle Tone: within normal limits Gait & Station: normal Patient leans: N/A  Psychiatric Specialty Exam: Physical Exam Vitals and nursing note reviewed.  Constitutional:      Appearance: He is well-developed.  HENT:     Head: Normocephalic and atraumatic.  Eyes:     Conjunctiva/sclera: Conjunctivae normal.     Pupils: Pupils are equal, round, and reactive to light.  Cardiovascular:     Heart sounds: Normal heart sounds.  Pulmonary:     Effort: Pulmonary effort is normal.  Abdominal:     Palpations: Abdomen is soft.  Musculoskeletal:        General: Normal range of motion.     Cervical back: Normal range of motion.  Skin:    General: Skin is warm and dry.  Neurological:     General: No focal deficit present.     Mental Status: He is alert.  Psychiatric:        Attention and Perception: Attention normal.        Mood and Affect: Mood normal.        Speech: Speech normal.        Behavior: Behavior is cooperative.        Thought Content: Thought content normal.        Cognition and Memory: Cognition is impaired.        Judgment: Judgment normal.     Review of Systems  Constitutional: Negative.   HENT: Negative.   Eyes: Negative.   Respiratory: Negative.   Cardiovascular: Negative.   Gastrointestinal: Negative.   Musculoskeletal: Negative.   Skin: Negative.   Neurological: Negative.   Psychiatric/Behavioral: The patient is nervous/anxious.     Blood pressure 124/81, pulse 90, temperature 97.9 F (36.6 C), resp. rate 18, height 5\' 8"  (1.727 m),  weight 104.3 kg, SpO2 100 %.Body mass index is 34.96 kg/m.  General Appearance: Casual  Eye Contact:  Good  Speech:  Clear and Coherent  Volume:  Normal  Mood:  Euthymic  Affect:  Appropriate  Thought Process:  Goal Directed  Orientation:  Full (Time, Place, and Person)  Thought Content:  Logical  Suicidal Thoughts:  No  Homicidal Thoughts:  No  Memory:  Immediate;   Fair Recent;   Fair Remote;   Fair  Judgement:  Intact  Insight:  Fair  Psychomotor Activity:  Normal  Concentration:  Concentration: Fair  Recall:  [employer] of Knowledge:  Fair  Language:  Fair  Akathisia:  No  Handed:  Right  AIMS (if indicated):     Assets:  Desire for Improvement Social Support  ADL's:  Impaired  Cognition:  Impaired,  Mild  Sleep:  Number of Hours: 5.25     Treatment Plan Summary: Daily contact with patient to assess and evaluate symptoms and progress in treatment, Medication management and Plan No change to medication.  Might possibly be starting to do a little bit better with the Depakote.  We will check the blood level over the next couple days.  No change to medicine today.  Supportive therapy and encouragement especially with the interview  Alethia Berthold, MD 04/17/2020, 1:31 PM

## 2020-04-17 NOTE — Plan of Care (Signed)
  Problem: Education: Goal: Knowledge of [facility] Education information/materials will improve Outcome: Progressing Goal: Emotional status will improve Outcome: Progressing Goal: Mental status will improve Outcome: Progressing Goal: Verbalization of understanding the information provided will improve Outcome: Progressing   Problem: Activity: Goal: Interest or engagement in activities will improve Outcome: Progressing Goal: Sleeping patterns will improve Outcome: Progressing   Problem: Coping: Goal: Ability to verbalize frustrations and anger appropriately will improve Outcome: Progressing Goal: Ability to demonstrate self-control will improve Outcome: Progressing   Problem: Health Behavior/Discharge Planning: Goal: Identification of resources available to assist in meeting health care needs will improve Outcome: Progressing Goal: Compliance with treatment plan for underlying cause of condition will improve Outcome: Progressing   Problem: Physical Regulation: Goal: Ability to maintain clinical measurements within normal limits will improve Outcome: Progressing   Problem: Safety: Goal: Periods of time without injury will increase Outcome: Progressing   Problem: Education: Goal: Ability to make informed decisions regarding treatment will improve Outcome: Progressing   Problem: Coping: Goal: Coping ability will improve Outcome: Progressing   Problem: Health Behavior/Discharge Planning: Goal: Identification of resources available to assist in meeting health care needs will improve Outcome: Progressing   Problem: Medication: Goal: Compliance with prescribed medication regimen will improve Outcome: Progressing   Problem: Self-Concept: Goal: Ability to disclose and discuss suicidal ideas will improve Outcome: Progressing Goal: Will verbalize positive feelings about self Outcome: Progressing   Problem: Coping: Goal: Coping ability will improve Outcome:  Progressing Goal: Will verbalize feelings Outcome: Progressing   Problem: Coping: Goal: Ability to identify and develop effective coping behavior will improve Outcome: Progressing

## 2020-04-18 DIAGNOSIS — F0391 Unspecified dementia with behavioral disturbance: Secondary | ICD-10-CM

## 2020-04-18 DIAGNOSIS — F250 Schizoaffective disorder, bipolar type: Secondary | ICD-10-CM | POA: Diagnosis not present

## 2020-04-18 NOTE — Progress Notes (Signed)
Patient appears much calmer. He has not been perseverating about needing to leave and needing his freedoms. He seems content. Denies SI, HI and AVH.

## 2020-04-18 NOTE — Plan of Care (Signed)
  Problem: Education: Goal: Knowledge of [facility] Education information/materials will improve Outcome: Progressing Goal: Emotional status will improve Outcome: Progressing Goal: Mental status will improve Outcome: Progressing Goal: Verbalization of understanding the information provided will improve Outcome: Progressing   

## 2020-04-18 NOTE — Plan of Care (Signed)
Pt denies depression, anxiety, SI, HI and AVH. Pt was educated on care plan and verbalizes understanding. Collier Bullock RN Problem: Education: Goal: Knowledge of [facility] Education information/materials will improve Outcome: Progressing Goal: Emotional status will improve Outcome: Progressing Goal: Mental status will improve Outcome: Progressing Goal: Verbalization of understanding the information provided will improve Outcome: Progressing   Problem: Activity: Goal: Interest or engagement in activities will improve Outcome: Progressing Goal: Sleeping patterns will improve Outcome: Progressing   Problem: Coping: Goal: Ability to verbalize frustrations and anger appropriately will improve Outcome: Progressing Goal: Ability to demonstrate self-control will improve Outcome: Progressing   Problem: Health Behavior/Discharge Planning: Goal: Identification of resources available to assist in meeting health care needs will improve Outcome: Progressing Goal: Compliance with treatment plan for underlying cause of condition will improve Outcome: Progressing   Problem: Physical Regulation: Goal: Ability to maintain clinical measurements within normal limits will improve Outcome: Progressing   Problem: Safety: Goal: Periods of time without injury will increase Outcome: Progressing   Problem: Education: Goal: Ability to make informed decisions regarding treatment will improve Outcome: Progressing   Problem: Coping: Goal: Coping ability will improve Outcome: Progressing   Problem: Health Behavior/Discharge Planning: Goal: Identification of resources available to assist in meeting health care needs will improve Outcome: Progressing   Problem: Medication: Goal: Compliance with prescribed medication regimen will improve Outcome: Progressing   Problem: Self-Concept: Goal: Ability to disclose and discuss suicidal ideas will improve Outcome: Progressing Goal: Will  verbalize positive feelings about self Outcome: Progressing   Problem: Coping: Goal: Coping ability will improve Outcome: Progressing Goal: Will verbalize feelings Outcome: Progressing   Problem: Coping: Goal: Ability to identify and develop effective coping behavior will improve Outcome: Progressing

## 2020-04-18 NOTE — Progress Notes (Signed)
[facility] MD Progress Note  04/18/2020 8:56 AM Alexander Beard  MRN:  [id number]  Principal Problem: Dementia [facility]) Diagnosis: Principal Problem:   Dementia ([place]) Active Problems:   Schizoaffective disorder, bipolar type ([place])   Active autistic disorder   Diabetes ([place])   Hypertension   Prostate hypertrophy   Intellectual disability   At risk for elopement  Alexander Beard is a 64 y.o. male pt that has a previous psychiatric history of Schizoaffective disorder who presents to the [facility] unit for treatment of psychosis.  Interval History Patient was seen today for re-evaluation.  Nursing reports no events overnight. The patient reports no issues with performing ADLs.  Patient has been medication compliant.  The patient reports no side effects from medications.    SUBJECTIVE:  Patient requested himself to talk to me this morning. He reported that it is important for him to explain his behavior this morning. He apparently was observed yelling in his room. He reports that he has "imagination that someone takes away the tray with spaghetti" and he yelled "at this imagination in my head", "I know it was not real". He reports he did not sleep well last night due to noises in the unit and that he feels "exhausted" this morning. Reports feeling well overall, denies feeling depressed, anxious; denies suicidal or homicidal thoughts, denies any hallucinations at the time of the interview. He denies side effects from his current medications; he relates his tiredness this morning to poor sleep last night.   Total Time spent with patient: 15 minutes  Past Psychiatric History: see H&P   Past Medical History:  Past Medical History:  Diagnosis Date   Anxiety    Anxiety disorder due to known physiological condition    HOSPITALIZED 10/18   Arthritis    Autistic disorder, residual state    COPD (chronic obstructive pulmonary disease) ([place])    Depression    Developmental disorder    Diabetes mellitus  without complication (HCC)    Dyslipidemia    Esophageal reflux    HOH (hard of hearing)    MILDLY   Hypertension    Obesity    Overactive bladder    Palpitations    ANXIETY   Schizophrenia, schizoaffective (HCC)    Sleep apnea    Tremors of nervous system    HANDS DUE TO MEDICATIONS   Urinary incontinence     Past Surgical History:  Procedure Laterality Date   CATARACT EXTRACTION W/PHACO Left 11/14/2017   Procedure: CATARACT EXTRACTION PHACO AND INTRAOCULAR LENS PLACEMENT ([place]);  Surgeon: Birder Robson, MD;  Location: [facility] ORS;  Service: Ophthalmology;  Laterality: Left;  [place] 00:42 AP% 14.6 CDE 6.12 Fluid pack lot # [id number]   COLONOSCOPY  01/28/2005   COLONOSCOPY WITH PROPOFOL N/A 05/09/2016   Procedure: COLONOSCOPY WITH PROPOFOL;  Surgeon: Manya Silvas, MD;  Location: [facility] ENDOSCOPY;  Service: Endoscopy;  Laterality: N/A;   ESOPHAGOGASTRODUODENOSCOPY N/A 05/09/2016   Procedure: ESOPHAGOGASTRODUODENOSCOPY (EGD);  Surgeon: Manya Silvas, MD;  Location: [facility] ENDOSCOPY;  Service: Endoscopy;  Laterality: N/A;   FRACTURE SURGERY     ORIF SHOULDER   TONSILLECTOMY     Family History:  Family History  Problem Relation Age of Onset   Hypertension Mother    Stroke Father    Heart Problems Father    Family Psychiatric  History: see H&P Social History   Substance and Sexual Activity  Alcohol Use No   Alcohol/week: 0.0 standard drinks     Social History   Substance  and Sexual Activity  Drug Use No    Social History   Socioeconomic History   Marital status: Single    Spouse name: Not on file   Number of children: Not on file   Years of education: Not on file   Highest education level: Not on file  Occupational History   Not on file  Tobacco Use   Smoking status: Never Smoker   Smokeless tobacco: Never Used  Vaping Use   Vaping Use: Never used  Substance and Sexual Activity   Alcohol use: No    Alcohol/week: 0.0 standard  drinks   Drug use: No   Sexual activity: Never  Other Topics Concern   Not on file  Social History Narrative   The patient never finished high school but did get his GED. 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 He is currently in disability and his brother Alexander Beard is his legal guardian. He has been living in group homes for many years.      No pending legal charges   Social Determinants of Health   Financial Resource Strain:    Difficulty of Paying Living Expenses: Not on file  Food Insecurity:    Worried About [place] in the Last Year: Not on file   Ran Out of Food in the Last Year: Not on file  Transportation Needs:    Lack of Transportation (Medical): Not on file   Lack of Transportation (Non-Medical): Not on file  Physical Activity:    Days of Exercise per Week: Not on file   Minutes of Exercise per Session: Not on file  Stress:    Feeling of Stress : Not on file  Social Connections:    Frequency of Communication with Friends and Family: Not on file   Frequency of Social Gatherings with Friends and Family: Not on file   Attends Religious Services: Not on file   Active Member of Clubs or Organizations: Not on file   Attends Archivist Meetings: Not on file   Marital Status: Not on file   Additional Social History:                         Sleep: Fair  Appetite:  Fair  Current Medications: Current Facility-Administered Medications  Medication Dose Route Frequency Provider Last Rate Last Admin   acetaminophen (TYLENOL) tablet 650 mg  650 mg Oral Q6H PRN [name], MD       alum & mag hydroxide-simeth (MAALOX/MYLANTA) 200-200-20 MG/5ML suspension 30 mL  30 mL Oral Q4H PRN [name], MD       aspirin EC tablet 81 mg  81 mg Oral Daily [name], [name], MD   81 mg at 04/18/20 0834   clonazePAM (KLONOPIN) tablet 1 mg  1 mg Oral QID [name], [name], MD    1 mg at 04/18/20 0835   divalproex (DEPAKOTE ER) 24 hr tablet 1,500 mg  1,500 mg Oral QHS [name], [name], MD   1,500 mg at 04/17/20 2104   gabapentin (NEURONTIN) capsule 100 mg  100 mg Oral TID [name], [name], MD   100 mg at 04/18/20 0835   hydrOXYzine (ATARAX/VISTARIL) tablet 10 mg  10 mg Oral TID PRN [name], MD   10 mg at 04/16/20 0904   influenza vac split quadrivalent PF (FLUARIX) injection 0.5 mL  0.5 mL Intramuscular Tomorrow-1000 [name], John  T, MD       lamoTRIgine (LAMICTAL) tablet 100 mg  100 mg Oral QHS [name], [name], MD   100 mg at 04/17/20 2105   linagliptin (TRADJENTA) tablet 5 mg  5 mg Oral Daily [name], [name], MD   5 mg at 04/18/20 0834   loperamide (IMODIUM) capsule 2 mg  2 mg Oral Q4H PRN [name], MD   2 mg at 04/10/20 0917   loratadine (CLARITIN) tablet 10 mg  10 mg Oral Daily [name], [name], MD   10 mg at 04/18/20 0835   LORazepam (ATIVAN) tablet 2 mg  2 mg Oral Q6H PRN [name], [name], MD   2 mg at 04/07/20 1240   magnesium hydroxide (MILK OF MAGNESIA) suspension 30 mL  30 mL Oral Daily PRN [name], MD   30 mL at 03/23/20 1344   metFORMIN (GLUCOPHAGE) tablet 850 mg  850 mg Oral BID WC [name], [name], MD   850 mg at 04/18/20 0834   metoprolol succinate (TOPROL-XL) 24 hr tablet 50 mg  50 mg Oral Daily [name], [name], MD   50 mg at 04/18/20 0833   ondansetron (ZOFRAN) tablet 4 mg  4 mg Oral Q8H PRN [name], [name], MD   4 mg at 04/17/20 0344   oxybutynin (DITROPAN) tablet 10 mg  10 mg Oral BID [name], [name], MD   10 mg at 04/18/20 0835   pantoprazole (PROTONIX) EC tablet 40 mg  40 mg Oral Daily [name], [name], MD   40 mg at 04/18/20 0836   prazosin (MINIPRESS) capsule 1 mg  1 mg Oral QHS [name], [name], MD   1 mg at 04/17/20 2215   QUEtiapine (SEROQUEL) tablet 100 mg  100 mg Oral QID [name], [name], MD   100 mg at 04/18/20 0834   QUEtiapine (SEROQUEL) tablet 400 mg  400 mg Oral QHS [name], NP   400 mg at  04/17/20 2105   sertraline (ZOLOFT) tablet 50 mg  50 mg Oral BID [name], [name], MD   50 mg at 04/18/20 0834   simethicone (MYLICON) chewable tablet 80 mg  80 mg Oral TID [name], [name], MD   80 mg at 04/18/20 1607   simvastatin (ZOCOR) tablet 20 mg  20 mg Oral q1800 [name], [name], MD   20 mg at 04/17/20 1801   tamsulosin (FLOMAX) capsule 0.8 mg  0.8 mg Oral QPC supper [name], [name], MD   0.8 mg at 04/17/20 1803    Lab Results:  Results for orders placed or performed during the hospital encounter of 02/19/20 (from the past 48 hour(s))  Valproic acid level     Status: None   Collection Time: 04/17/20  6:29 AM  Result Value Ref Range   Valproic Acid Lvl 53 50.0 - 100.0 ug/mL    Comment: Performed at [facility], [address] [place], [place] [address]    Blood Alcohol level:  Lab Results  Component Value Date   [facility] <10 01/22/2020   ETH <10 26/94/8546    Metabolic Disorder Labs: Lab Results  Component Value Date   HGBA1C 6.6 (H) 02/20/2020   MPG 142.72 02/20/2020   No results found for: PROLACTIN Lab Results  Component Value Date   CHOL 153 04/10/2015   TRIG 166 (H) 04/10/2015   HDL 50 04/10/2015   CHOLHDL 3.1 04/10/2015   VLDL 33 04/10/2015   [place] 70 04/10/2015    Physical Findings: AIMS:  , ,  ,  ,  CIWA:    COWS:     Musculoskeletal: Strength & Muscle Tone: within normal limits Gait & Station: normal Patient leans: N/A  Psychiatric Specialty Exam: Physical Exam   Review of Systems   Blood pressure 116/76, pulse (!) 58, temperature 98.4 F (36.9 C), temperature source Oral, resp. rate (!) 8, height 5\' 8"  (1.727 m), weight 104.3 kg, SpO2 100 %.Body mass index is 34.96 kg/m.  General Appearance: Disheveled  Eye Contact:  Fair  Speech:  Slightly pressured  Volume:  Normal  Mood:  Euthymic  Affect:  Constricted  Thought Process:  Coherent  Orientation:  Full (Time, Place, and Person)  Thought Content:  Illogical  Suicidal  Thoughts:  No  Homicidal Thoughts:  No  Memory:  NA  Judgement:  Impaired  Insight:  Shallow  Psychomotor Activity:  Increased  Concentration:  Concentration: Poor and Attention Span: Poor  Recall:  [employer] of Knowledge:  Fair  Language:  Fair  Akathisia:  No  Handed:  Right  AIMS (if indicated):     Assets:  Communication Skills Desire for Improvement  ADL's:  Intact  Cognition:  Impaired,  Mild  Sleep:  Number of Hours: 6     Treatment Plan Summary: Daily contact with patient to assess and evaluate symptoms and progress in treatment and Medication management   Patient is a 64 year old male with the above-stated past psychiatric history who is seen in follow-up.  Chart reviewed. Patient discussed with nursing. Patient appears stable, likely at his mental baseline. He is awaiting placement. Referrals sent by SW.    Plan:  -continue inpatient psych admission; 15-minute checks; daily contact with patient to assess and evaluate symptoms and progress in treatment; psychoeducation.  -continue scheduled medications without changes today.  aspirin EC  81 mg Oral Daily   clonazePAM  1 mg Oral QID   divalproex  1,500 mg Oral QHS   gabapentin  100 mg Oral TID   influenza vac split quadrivalent PF  0.5 mL Intramuscular Tomorrow-1000   lamoTRIgine  100 mg Oral QHS   linagliptin  5 mg Oral Daily   loratadine  10 mg Oral Daily   metFORMIN  850 mg Oral BID WC   metoprolol succinate  50 mg Oral Daily   oxybutynin  10 mg Oral BID   pantoprazole  40 mg Oral Daily   prazosin  1 mg Oral QHS   QUEtiapine  100 mg Oral QID   QUEtiapine  400 mg Oral QHS   sertraline  50 mg Oral BID   simethicone  80 mg Oral TID   simvastatin  20 mg Oral q1800   tamsulosin  0.8 mg Oral QPC supper   -continue PRN medications. acetaminophen, alum & mag hydroxide-simeth, hydrOXYzine, loperamide, LORazepam, magnesium hydroxide, ondansetron  -Disposition: to be determined.  Continuing  to search for placement.  Larita Fife, MD 04/18/2020, 8:56 AM

## 2020-04-18 NOTE — BHH Counselor (Signed)
   LCSW Group Therapy Note     04/18/2020 1:08-1:45 PM      Type of Therapy and Topic:  Group Therapy:  Overcoming Obstacles     Participation Level:  Active     Description of Group:     In this group patients will be encouraged to explore what they see as obstacles to their own wellness and recovery. They will be guided to discuss their thoughts, feelings, and behaviors related to these obstacles. The group will process together ways to cope with barriers, with attention given to specific choices patients can make. Each patient will be challenged to identify changes they are motivated to make in order to overcome their obstacles. This group will be process-oriented, with patients participating in exploration of their own experiences as well as giving and receiving support and challenge from other group members.     Therapeutic Goals:  1.    Patient will identify personal and current obstacles as they relate to admission.  2.    Patient will identify barriers that currently interfere with their wellness or overcoming obstacles.  3.    Patient will identify feelings, thought process and behaviors related to these barriers.  4.    Patient will identify two changes they are willing to make to overcome these obstacles:        Summary of Patient Progress: Patient checked into group feeling refreshed. Patient stated that he has been praying and relaxing. CSW asked patient about his current obstacle and patient stated he has been in the hospital too long. Patient stated that he would like to regain his ambitions and get back to his old way of life. Patient stated he would like to see his brother and get more professional help. Patient also stated that he is dealing with his panic attacks. Patient stated that he is not mad but upset and stressed due not having the things he used to have. Patient stated two things he will change is to control his emotions and be more understanding.            Therapeutic Modalities:    Cognitive Behavioral Therapy  Solution Focused Therapy  Motivational Interviewing  Relapse Prevention Therapy    [name], [place]   04/18/2020

## 2020-04-19 DIAGNOSIS — F250 Schizoaffective disorder, bipolar type: Secondary | ICD-10-CM | POA: Diagnosis not present

## 2020-04-19 DIAGNOSIS — F0391 Unspecified dementia with behavioral disturbance: Secondary | ICD-10-CM | POA: Diagnosis not present

## 2020-04-19 NOTE — Progress Notes (Signed)
Patient still a little bit anxious at times. Asked to shave. Seemed interested in tending to his hygiene a little bit more. Denies SI, HI and AVH

## 2020-04-19 NOTE — B[facility] Group Notes (Signed)
LCSW Group Therapy Note  04/19/2020 1:09- 2:11 PM     Type of Therapy and Topic:  Group Therapy: Avoiding Self-Sabotaging and Enabling Behaviors  Participation Level:  Active   Description of Group:   In this group, patients will learn how to identify obstacles, self-sabotaging and enabling behaviors, as well as: what are they, why do we do them and what needs these behaviors meet. Discuss unhealthy relationships and how to have positive healthy boundaries with those that sabotage and enable. Explore aspects of self-sabotage and enabling in yourself and how to limit these self-destructive behaviors in everyday life.   Therapeutic Goals: 1. Patient will identify one obstacle that relates to self-sabotage and enabling behaviors 2. Patient will identify one personal self-sabotaging or enabling behavior they did prior to admission 3. Patient will state a plan to change the above identified behavior 4. Patient will demonstrate ability to communicate their needs through discussion and/or role play.   Summary of Patient Progress: Patient checked into group feeling pretty good. Patient stated that he is more alert today. Patient spoke about his obstacle of being in the hospital too long and getting back to his old ways. Patient agreed that sometimes he gets frustrated and angry that could prevent him from finding a new placement. Patient was upset after group and felt he did not get to say as much as he wanted to because of the other patients talking.     Therapeutic Modalities:   Cognitive Behavioral Therapy Person-Centered Therapy Motivational [place], [place]   04/19/2020

## 2020-04-19 NOTE — Plan of Care (Signed)
  Problem: Education: Goal: Knowledge of [facility] Education information/materials will improve Outcome: Progressing Goal: Emotional status will improve Outcome: Progressing Goal: Mental status will improve Outcome: Progressing Goal: Verbalization of understanding the information provided will improve Outcome: Progressing   

## 2020-04-19 NOTE — Progress Notes (Signed)
Patient denies SI, HI and AVH.  Patient reported mild anxiety and was noted to be a bit tremulous.  Patient has been compliant medications and unit activities.   Assess patient for safety, offer medication as prescribed, engage patient in 1:1 staff talks.   Continue to monitor as planned.

## 2020-04-19 NOTE — Progress Notes (Signed)
[facility] MD Progress Note  04/19/2020 9:47 AM JABER DUNLOW  MRN:  [id number]  Principal Problem: Dementia [facility]) Diagnosis: Principal Problem:   Dementia ([place]) Active Problems:   Schizoaffective disorder, bipolar type ([place])   Active autistic disorder   Diabetes ([place])   Hypertension   Prostate hypertrophy   Intellectual disability   At risk for elopement  Mr.Halm is a 64 y.o. male pt that has a previous psychiatric history of Schizoaffective disorder who presents to the [facility] unit for treatment of psychosis.  Interval History Patient was seen today for re-evaluation.  Nursing reports no events overnight. The patient reports no issues with performing ADLs.  Patient has been medication compliant.  The patient reports no side effects from medications.    SUBJECTIVE:  Patient is fixated on nursing students visits. He says he had not seen nursing students for a while and that those visits play important role in his entertaintment. He asks to find out exact schedule of nursing students visits. He reports feeling well overall, denies feeling depressed. He is mildly-anxious. He denies suicidal or homicidal thoughts, denies any hallucinations at the time of the interview. He denies side effects from his current medications. He shaved face last night is is proud about taking good care of himself.   Total Time spent with patient: 15 minutes  Past Psychiatric History: see H&P   Past Medical History:  Past Medical History:  Diagnosis Date   Anxiety    Anxiety disorder due to known physiological condition    HOSPITALIZED 10/18   Arthritis    Autistic disorder, residual state    COPD (chronic obstructive pulmonary disease) ([place])    Depression    Developmental disorder    Diabetes mellitus without complication (HCC)    Dyslipidemia    Esophageal reflux    HOH (hard of hearing)    MILDLY   Hypertension    Obesity    Overactive bladder    Palpitations    ANXIETY    Schizophrenia, schizoaffective (HCC)    Sleep apnea    Tremors of nervous system    HANDS DUE TO MEDICATIONS   Urinary incontinence     Past Surgical History:  Procedure Laterality Date   CATARACT EXTRACTION W/PHACO Left 11/14/2017   Procedure: CATARACT EXTRACTION PHACO AND INTRAOCULAR LENS PLACEMENT ([place]);  Surgeon: Birder Robson, MD;  Location: [facility] ORS;  Service: Ophthalmology;  Laterality: Left;  [place] 00:42 AP% 14.6 CDE 6.12 Fluid pack lot # [id number]   COLONOSCOPY  01/28/2005   COLONOSCOPY WITH PROPOFOL N/A 05/09/2016   Procedure: COLONOSCOPY WITH PROPOFOL;  Surgeon: Manya Silvas, MD;  Location: [facility] ENDOSCOPY;  Service: Endoscopy;  Laterality: N/A;   ESOPHAGOGASTRODUODENOSCOPY N/A 05/09/2016   Procedure: ESOPHAGOGASTRODUODENOSCOPY (EGD);  Surgeon: Manya Silvas, MD;  Location[facility] ENDOSCOPY;  Service: Endoscopy;  Laterality: N/A;   FRACTURE SURGERY     ORIF SHOULDER   TONSILLECTOMY     Family History:  Family History  Problem Relation Age of Onset   Hypertension Mother    Stroke Father    Heart Problems Father    Family Psychiatric  History: see H&P Social History   Substance and Sexual Activity  Alcohol Use No   Alcohol/week: 0.0 standard drinks     Social History   Substance and Sexual Activity  Drug Use No    Social History   Socioeconomic History   Marital status: Single    Spouse name: Not on file   Number of children: Not on  file   Years of education: Not on file   Highest education level: Not on file  Occupational History   Not on file  Tobacco Use   Smoking status: Never Smoker   Smokeless tobacco: Never Used  Vaping Use   Vaping Use: Never used  Substance and Sexual Activity   Alcohol use: No    Alcohol/week: 0.0 standard drinks   Drug use: No   Sexual activity: Never  Other Topics Concern   Not on file  Social History Narrative   The patient never finished high school but did get his GED. He works in the  past as a Museum/gallery conservator. He has never been married and has no children. He is currently in disability and [name] is his legal guardian. He has been living in group homes for many years.      No pending legal charges   Social Determinants of Health   Financial Resource Strain:    Difficulty of Paying Living Expenses: Not on file  Food Insecurity:    Worried About [place] in the Last Year: Not on file   Ran Out of Food in the Last Year: Not on file  Transportation Needs:    Lack of Transportation (Medical): Not on file   Lack of Transportation (Non-Medical): Not on file  Physical Activity:    Days of Exercise per Week: Not on file   Minutes of Exercise per Session: Not on file  Stress:    Feeling of Stress : Not on file  Social Connections:    Frequency of Communication with Friends and Family: Not on file   Frequency of Social Gatherings with Friends and Family: Not on file   Attends Religious Services: Not on file   Active Member of Clubs or Organizations: Not on file   Attends Archivist Meetings: Not on file   Marital Status: Not on file   Additional Social History:                         Sleep: Fair  Appetite:  Fair  Current Medications: Current Facility-Administered Medications  Medication Dose Route Frequency Provider Last Rate Last Admin   acetaminophen (TYLENOL) tablet 650 mg  650 mg Oral Q6H PRN [name], MD       alum & mag hydroxide-simeth (MAALOX/MYLANTA) 200-200-20 MG/5ML suspension 30 mL  30 mL Oral Q4H PRN [name], MD       aspirin EC tablet 81 mg  81 mg Oral Daily [name], [name], MD   81 mg at 04/19/20 0911   clonazePAM (KLONOPIN) tablet 1 mg  1 mg Oral QID [name], [name], MD   1 mg at 04/19/20 0910   divalproex (DEPAKOTE ER) 24 hr tablet 1,500 mg  1,500 mg Oral QHS [name], [name], MD   1,500 mg at 04/18/20 2138   gabapentin (NEURONTIN) capsule 100 mg  100 mg Oral  TID [name], [name], MD   100 mg at 04/19/20 0911   hydrOXYzine (ATARAX/VISTARIL) tablet 10 mg  10 mg Oral TID PRN [name], MD   10 mg at 04/16/20 0904   influenza vac split quadrivalent PF (FLUARIX) injection 0.5 mL  0.5 mL Intramuscular Tomorrow-1000 [name], [name], MD       lamoTRIgine (LAMICTAL) tablet 100 mg  100 mg Oral QHS [name], [name], MD   100 mg at 04/18/20 2138   linagliptin (TRADJENTA) tablet 5 mg  5 mg Oral Daily [name], [name], MD   5 mg at 04/19/20 0626   loperamide (IMODIUM) capsule 2 mg  2 mg Oral Q4H PRN [name], MD   2 mg at 04/10/20 9485   loratadine (CLARITIN) tablet 10 mg  10 mg Oral Daily [name], [name], MD   10 mg at 04/19/20 0915   LORazepam (ATIVAN) tablet 2 mg  2 mg Oral Q6H PRN [name], [name], MD   2 mg at 04/07/20 1240   magnesium hydroxide (MILK OF MAGNESIA) suspension 30 mL  30 mL Oral Daily PRN [name], MD   30 mL at 03/23/20 1344   metFORMIN (GLUCOPHAGE) tablet 850 mg  850 mg Oral BID WC [name], [name], MD   850 mg at 04/19/20 0912   metoprolol succinate (TOPROL-XL) 24 hr tablet 50 mg  50 mg Oral Daily [name], [name], MD   50 mg at 04/19/20 0911   ondansetron (ZOFRAN) tablet 4 mg  4 mg Oral Q8H PRN [name], [name], MD   4 mg at 04/17/20 0344   oxybutynin (DITROPAN) tablet 10 mg  10 mg Oral BID [name], [name], MD   10 mg at 04/19/20 0912   pantoprazole (PROTONIX) EC tablet 40 mg  40 mg Oral Daily [name], [name], MD   40 mg at 04/19/20 0912   prazosin (MINIPRESS) capsule 1 mg  1 mg Oral QHS [name], [name], MD   1 mg at 04/17/20 2215   QUEtiapine (SEROQUEL) tablet 100 mg  100 mg Oral QID [name], [name], MD   100 mg at 04/19/20 0912   QUEtiapine (SEROQUEL) tablet 400 mg  400 mg Oral QHS [name], NP   400 mg at 04/18/20 2136   sertraline (ZOLOFT) tablet 50 mg  50 mg Oral BID [name], [name], MD   50 mg at 04/19/20 0911   simethicone (MYLICON) chewable tablet 80 mg  80 mg Oral TID [name], [name], MD   80  mg at 04/19/20 0910   simvastatin (ZOCOR) tablet 20 mg  20 mg Oral q1800 [name], [name], MD   20 mg at 04/18/20 1804   tamsulosin (FLOMAX) capsule 0.8 mg  0.8 mg Oral QPC supper [name], [name], MD   0.8 mg at 04/18/20 1807    Lab Results:  No results found for this or any previous visit (from the past 57 hour(s)).  Blood Alcohol level:  Lab Results  Component Value Date   ETH <10 01/22/2020   ETH <10 46/27/0350    Metabolic Disorder Labs: Lab Results  Component Value Date   HGBA1C 6.6 (H) 02/20/2020   MPG 142.72 02/20/2020   No results found for: PROLACTIN Lab Results  Component Value Date   CHOL 153 04/10/2015   TRIG 166 (H) 04/10/2015   HDL 50 04/10/2015   CHOLHDL 3.1 04/10/2015   VLDL 33 04/10/2015   LDLCALC 70 04/10/2015    Physical Findings: AIMS:  , ,  ,  ,    CIWA:    COWS:     Musculoskeletal: Strength & Muscle Tone: within normal limits Gait & Station: normal Patient leans: N/A  Psychiatric Specialty Exam: Physical Exam   Review of Systems   Blood pressure 107/64, pulse (!) 59, temperature 98 F (36.7 C), temperature source Oral, resp. rate 14, height 5\' 8"  (1.727 m), weight 104.3 kg, SpO2 100 %.Body mass index is 34.96 kg/m.  General Appearance: Disheveled  Eye Contact:  Fair  Speech:  Slightly pressured  Volume:  Normal  Mood:  Euthymic  Affect:  Constricted  Thought Process:  Coherent  Orientation:  Full (Time, Place, and Person)  Thought Content:  Illogical  Suicidal Thoughts:  No  Homicidal Thoughts:  No  Memory:  NA  Judgement:  Impaired  Insight:  Shallow  Psychomotor Activity:  Increased  Concentration:  Concentration: Poor and Attention Span: Poor  Recall:  [employer] of Knowledge:  Fair  Language:  Fair  Akathisia:  No  Handed:  Right  AIMS (if indicated):     Assets:  Communication Skills Desire for Improvement  ADL's:  Intact  Cognition:  Impaired,  Mild  Sleep:  Number of Hours: 5.45     Treatment Plan  Summary: Daily contact with patient to assess and evaluate symptoms and progress in treatment and Medication management   Patient is a 64 year old male with the above-stated past psychiatric history who is seen in follow-up.  Chart reviewed. Patient discussed with nursing. Patient appears stable, likely at his mental baseline. He is awaiting placement. Referrals sent by SW.    Plan:  -continue inpatient psych admission; 15-minute checks; daily contact with patient to assess and evaluate symptoms and progress in treatment; psychoeducation.  -continue scheduled medications without changes today.  aspirin EC  81 mg Oral Daily   clonazePAM  1 mg Oral QID   divalproex  1,500 mg Oral QHS   gabapentin  100 mg Oral TID   influenza vac split quadrivalent PF  0.5 mL Intramuscular Tomorrow-1000   lamoTRIgine  100 mg Oral QHS   linagliptin  5 mg Oral Daily   loratadine  10 mg Oral Daily   metFORMIN  850 mg Oral BID WC   metoprolol succinate  50 mg Oral Daily   oxybutynin  10 mg Oral BID   pantoprazole  40 mg Oral Daily   prazosin  1 mg Oral QHS   QUEtiapine  100 mg Oral QID   QUEtiapine  400 mg Oral QHS   sertraline  50 mg Oral BID   simethicone  80 mg Oral TID   simvastatin  20 mg Oral q1800   tamsulosin  0.8 mg Oral QPC supper    -continue PRN medications. acetaminophen, alum & mag hydroxide-simeth, hydrOXYzine, loperamide, LORazepam, magnesium hydroxide, ondansetron  -Disposition: to be determined.  Continuing search for placement.  Larita Fife, MD 04/19/2020, 9:47 AM

## 2020-04-20 DIAGNOSIS — F0391 Unspecified dementia with behavioral disturbance: Secondary | ICD-10-CM | POA: Diagnosis not present

## 2020-04-20 NOTE — BHH Counselor (Signed)
CSW contacted the following in an effort to identify possible placement for the patient:   [facility], [phone] No male beds available.   [facility], [phone] [facility]   [facility], [phone] CSW left HIPAA compliant voicemail.   [facility], [phone]  CSW left HIPAA compliant voicemail.  [place], [phone]  CSW left HIPAA compliant voicemail.  [facility] No group homes listed.   [facility] No group Homes listed.   [facility] No group home listed.   [place] No group homes listed.   [facility], [phone]  CSW left HIPAA compliant voicemail.   [address] [phone]  No male beds available.  [place], [phone] No male beds available.  [facility], [phone]   CSW left HIPAA compliant voicemail.  [facility] No group homes listed.  [place], [phone] Male beds available.  Asked to send information to [phone] attention Judeth Porch.  [facility], [phone] No male beds available.   [place], [phone]  Line rang without the option of leaving a voicemail.  [place] [facility], [phone]  Male beds available.  Please fax information to [place] at [phone].  [place], [phone]  No male beds available.   [facility] No group homes listed.  [place] [facility] [place] [facility], [phone]  CSW left HIPAA compliant voicemail.  [place] [facility], [phone] Asked to call [phone], Tamaria Mccollum.   CSW called the number provided, however, the voicemail box was full.    [facility], "[facility]", [phone] CSW left HIPAA compliant voicemail.   [facility],  [phone]  Number began to ring like a fax machine after several "normal" rings.  [facility], [phone]  Number began to ring like a fax machine after several "normal" rings.  [facility], [phone]  No male beds available.  [place], [phone] Number began to ring like a fax machine after several "normal" rings.  [facility], [phone] No beds available.    [facility] [place], [phone] Number began to ring like a fax machine after several "normal" rings.   [facility] No group homes listed.   [facility], [phone] Number began to ring like a fax machine after several "normal" rings.   [place], [phone] Line rang incessantly with no option to leave a voicemail.     Assunta Curtis, MSW, LCSW 04/20/2020 4:01 PM

## 2020-04-20 NOTE — Plan of Care (Signed)
  Problem: Education: Goal: Knowledge of [facility] Education information/materials will improve Outcome: Progressing Goal: Emotional status will improve Outcome: Progressing Goal: Mental status will improve Outcome: Progressing Goal: Verbalization of understanding the information provided will improve Outcome: Progressing   Problem: Activity: Goal: Interest or engagement in activities will improve Outcome: Progressing Goal: Sleeping patterns will improve Outcome: Progressing   

## 2020-04-20 NOTE — BHH Counselor (Signed)
CSW contacted Claiborne Billings [phone] at [facility].  He reports that he has not made a decision at this time.  He reports that he doesn't think that he will accept the patient.   CSW called Lattie Haw to check on if she would accept the patient to her home.  She DECLINED.  CSW contacted [name], [phone].   Assunta Curtis, MSW, LCSW 04/20/2020 1:14 PM

## 2020-04-20 NOTE — Plan of Care (Signed)
Patient stated that he had his extra sleep after breakfast and he played basket ball outside that made a good day.Patient rated his anxiety and depression 0/10.Denies SI,HI and AVH.Compliant with medications,Attended groups.Appetite and energy level good.Support and encouragement given.

## 2020-04-20 NOTE — Progress Notes (Signed)
Patient has been worried about his hair. He is wanting staff to supervise him with a pair of scissors so he can cut his hair. He is anxious to know when the student nurses will be coming, and seems to be taking more care with his appearance in preparation for their arrival. He shaved under supervision yesterday and is proud of the fact that he did this by himself. Denies SI, HI and AVH

## 2020-04-20 NOTE — Progress Notes (Signed)
Recreation Therapy Notes  Date: 04/20/2020  Time: 9:30 am   Location: Craft room   Behavioral response: Appropriate  Intervention Topic: Self-care  Discussion/Intervention:  Group content today was focused on Self-Care. The group defined self-care and some positive ways they care for themselves. Individuals expressed ways and reasons why they neglected any self-care in the past. Patients described ways to improve self-care in the future. The group explained what could happen if they did not do any self-care activities at all. The group participated in the intervention "self-care assessment" where they had a chance to discover some of their weaknesses and strengths in self- care. Patient came up with a self-care plan to improve themselves in the future.  Clinical Observations/Feedback: Patient came to group and defined self-care as doing things for yourself. He stated that he can improve his self-care by taking breaks, staying busy and getting enough rest. Participant expressed that you can not help others until you help yourself. Individual was social with peers and staff while participating in the intervention.  Kenadee Gates LRT/CTRS          Minetta Krisher 04/20/2020 11:53 AM

## 2020-04-20 NOTE — BHH Group Notes (Signed)
[facility] Group Notes:  (Nursing/MHT/Case Management/Adjunct)  Date:  04/20/2020  Time:  9:02 AM  Type of Therapy:  [place]  Participation Level:  Active  Participation Quality:  Appropriate and Attentive  Affect:  Appropriate  Cognitive:  Alert and Appropriate  Insight:  Appropriate  Engagement in Group:  Engaged  Modes of Intervention:  Discussion and Education  Summary of Progress/Problems:  Charna Busman 04/20/2020, 9:02 AM

## 2020-04-20 NOTE — Progress Notes (Signed)
[facility] MD Progress Note  04/20/2020 11:45 AM Alexander Beard  MRN:  [id number]   Subjective:  Alexander Beard was seen at bedside today. He states he is not getting enough rest, and requires extra naps in the morning after breakfast. He states the extra sleep helps him be nice to others and be more alert. He denies any needs at this time. He denies suicidal ideations, homicidal ideations, visual hallucinations, or auditory hallucinations.   Principal Problem: Dementia ([place]) Diagnosis: Principal Problem:   Dementia ([place]) Active Problems:   Schizoaffective disorder, bipolar type ([place])   Active autistic disorder   Diabetes ([place])   Hypertension   Prostate hypertrophy   Intellectual disability   At risk for elopement  Total Time spent with patient: 30 minutes  Past Psychiatric History: Longstanding mental disability  Past Medical History:  Past Medical History:  Diagnosis Date  . Anxiety   . Anxiety disorder due to known physiological condition    HOSPITALIZED 10/18  . Arthritis   . Autistic disorder, residual state   . COPD (chronic obstructive pulmonary disease) ([place])   . Depression   . Developmental disorder   . Diabetes mellitus without complication ([place])   . Dyslipidemia   . Esophageal reflux   . HOH (hard of hearing)    MILDLY  . Hypertension   . Obesity   . Overactive bladder   . Palpitations    ANXIETY  . Schizophrenia, schizoaffective ([place])   . Sleep apnea   . Tremors of nervous system    HANDS DUE TO MEDICATIONS  . Urinary incontinence     Past Surgical History:  Procedure Laterality Date  . CATARACT EXTRACTION W/PHACO Left 11/14/2017   Procedure: CATARACT EXTRACTION PHACO AND INTRAOCULAR LENS PLACEMENT (IOC);  Surgeon: Birder Robson, MD;  Location: [facility] ORS;  Service: Ophthalmology;  Laterality: Left;  [place] 00:42 AP% 14.6 CDE 6.12 Fluid pack lot # [id number]  . COLONOSCOPY  01/28/2005  . COLONOSCOPY WITH PROPOFOL N/A 05/09/2016   Procedure: COLONOSCOPY WITH  PROPOFOL;  Surgeon: Manya Silvas, MD;  Location: [facility] ENDOSCOPY;  Service: Endoscopy;  Laterality: N/A;  . ESOPHAGOGASTRODUODENOSCOPY N/A 05/09/2016   Procedure: ESOPHAGOGASTRODUODENOSCOPY (EGD);  Surgeon: Manya Silvas, MD;  Location: [facility] ENDOSCOPY;  Service: Endoscopy;  Laterality: N/A;  . FRACTURE SURGERY     ORIF SHOULDER  . TONSILLECTOMY     Family History:  Family History  Problem Relation Age of Onset  . Hypertension Mother   . Stroke Father   . Heart Problems Father    Family Psychiatric  History: See previous Social History:  Social History   Substance and Sexual Activity  Alcohol Use No  . Alcohol/week: 0.0 standard drinks     Social History   Substance and Sexual Activity  Drug Use No    Social History   Socioeconomic History  . Marital status: Single    Spouse name: Not on file  . Number of children: Not on file  . Years of education: Not on file  . Highest education level: Not on file  Occupational History  . Not on file  Tobacco Use  . Smoking status: Never Smoker  . Smokeless tobacco: Never Used  Vaping Use  . Vaping Use: Never used  Substance and Sexual Activity  . Alcohol use: No    Alcohol/week: 0.0 standard drinks  . Drug use: No  . Sexual activity: Never  Other Topics Concern  . Not on file  Social History Narrative   The patient  never finished high school but did get his GED. 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 He is currently in disability and [name] is his legal guardian. He has been living in group homes for many years.      No pending legal charges   Social Determinants of Health   Financial Resource Strain:   . Difficulty of Paying Living Expenses: Not on file  Food Insecurity:   . Worried About Charity fundraiser in the Last Year: Not on file  . Ran Out of Food in the Last Year: Not on file  Transportation Needs:   . Lack of Transportation  (Medical): Not on file  . Lack of Transportation (Non-Medical): Not on file  Physical Activity:   . Days of Exercise per Week: Not on file  . Minutes of Exercise per Session: Not on file  Stress:   . Feeling of Stress : Not on file  Social Connections:   . Frequency of Communication with Friends and Family: Not on file  . Frequency of Social Gatherings with Friends and Family: Not on file  . Attends Religious Services: Not on file  . Active Member of Clubs or Organizations: Not on file  . Attends Archivist Meetings: Not on file  . Marital Status: Not on file   Additional Social History:         Sleep: Fair  Appetite:  Fair  Current Medications: Current Facility-Administered Medications  Medication Dose Route Frequency Provider Last Rate Last Admin  . acetaminophen (TYLENOL) tablet 650 mg  650 mg Oral Q6H PRN [name], MD      . alum & mag hydroxide-simeth (MAALOX/MYLANTA) 200-200-20 MG/5ML suspension 30 mL  30 mL Oral Q4H PRN [name], MD      . aspirin EC tablet 81 mg  81 mg Oral Daily [name], [name], MD   81 mg at 04/20/20 8921  . clonazePAM (KLONOPIN) tablet 1 mg  1 mg Oral QID [name], [name], MD   1 mg at 04/20/20 1941  . divalproex (DEPAKOTE ER) 24 hr tablet 1,500 mg  1,500 mg Oral QHS [name], [name], MD   1,500 mg at 04/19/20 2112  . gabapentin (NEURONTIN) capsule 100 mg  100 mg Oral TID [name], [name], MD   100 mg at 04/20/20 0823  . hydrOXYzine (ATARAX/VISTARIL) tablet 10 mg  10 mg Oral TID PRN [name], MD   10 mg at 04/16/20 0904  . influenza vac split quadrivalent PF (FLUARIX) injection 0.5 mL  0.5 mL Intramuscular Tomorrow-1000 [name], [name], MD      . lamoTRIgine (LAMICTAL) tablet 100 mg  100 mg Oral QHS [name], [name], MD   100 mg at 04/19/20 2112  . linagliptin (TRADJENTA) tablet 5 mg  5 mg Oral Daily [name], [name], MD   5 mg at 04/20/20 7408  . loperamide (IMODIUM) capsule 2 mg  2 mg Oral Q4H PRN [name], MD   2 mg  at 04/10/20 0917  . loratadine (CLARITIN) tablet 10 mg  10 mg Oral Daily [name], [name], MD   10 mg at 04/20/20 1448  . LORazepam (ATIVAN) tablet 2 mg  2 mg Oral Q6H PRN [name], [name], MD   2 mg at 04/07/20 1240  . magnesium hydroxide (MILK OF MAGNESIA) suspension 30 mL  30 mL Oral Daily PRN [name], MD   30 mL  at 03/23/20 1344  . metFORMIN (GLUCOPHAGE) tablet 850 mg  850 mg Oral BID WC [name], [name], MD   850 mg at 04/20/20 0824  . metoprolol succinate (TOPROL-XL) 24 hr tablet 50 mg  50 mg Oral Daily [name], [name], MD   50 mg at 04/20/20 0824  . ondansetron (ZOFRAN) tablet 4 mg  4 mg Oral Q8H PRN [name], [name], MD   4 mg at 04/17/20 0344  . oxybutynin (DITROPAN) tablet 10 mg  10 mg Oral BID [name], [name], MD   10 mg at 04/20/20 0823  . pantoprazole (PROTONIX) EC tablet 40 mg  40 mg Oral Daily [name], [name], MD   40 mg at 04/20/20 2952  . prazosin (MINIPRESS) capsule 1 mg  1 mg Oral QHS [name], [name], MD   1 mg at 04/17/20 2215  . QUEtiapine (SEROQUEL) tablet 100 mg  100 mg Oral QID [name], [name], MD   100 mg at 04/20/20 0823  . QUEtiapine (SEROQUEL) tablet 400 mg  400 mg Oral QHS [name], NP   400 mg at 04/19/20 2111  . sertraline (ZOLOFT) tablet 50 mg  50 mg Oral BID [name], [name], MD   50 mg at 04/20/20 8413  . simethicone (MYLICON) chewable tablet 80 mg  80 mg Oral TID [name], [name], MD   80 mg at 04/20/20 2440  . simvastatin (ZOCOR) tablet 20 mg  20 mg Oral q1800 [name], [name], MD   20 mg at 04/19/20 1755  . tamsulosin (FLOMAX) capsule 0.8 mg  0.8 mg Oral QPC supper [name], [name], MD   0.8 mg at 04/19/20 1754    Lab Results: No results found for this or any previous visit (from the past 48 hour(s)).  Blood Alcohol level:  Lab Results  Component Value Date   ETH <10 01/22/2020   ETH <10 05/07/2535    Metabolic Disorder Labs: Lab Results  Component Value Date   HGBA1C 6.6 (H) 02/20/2020   MPG 142.72 02/20/2020   No results found for:  PROLACTIN Lab Results  Component Value Date   CHOL 153 04/10/2015   TRIG 166 (H) 04/10/2015   HDL 50 04/10/2015   CHOLHDL 3.1 04/10/2015   VLDL 33 04/10/2015   LDLCALC 70 04/10/2015    Physical Findings: AIMS:  , ,  ,  ,    CIWA:    COWS:     Musculoskeletal: Strength & Muscle Tone: within normal limits Gait & Station: normal Patient leans: N/A  Psychiatric Specialty Exam: Physical Exam Constitutional:      Appearance: Normal appearance.  HENT:     Head: Normocephalic and atraumatic.     Right Ear: External ear normal.     Left Ear: External ear normal.     Nose: Nose normal.     Mouth/Throat:     Mouth: Mucous membranes are moist.     Pharynx: Oropharynx is clear.  Eyes:     Extraocular Movements: Extraocular movements intact.     Conjunctiva/sclera: Conjunctivae normal.     Pupils: Pupils are equal, round, and reactive to light.  Cardiovascular:     Rate and Rhythm: Normal rate.     Pulses: Normal pulses.  Pulmonary:     Effort: Pulmonary effort is normal.     Breath sounds: Normal breath sounds.  Abdominal:     General: Abdomen is flat. There is no distension.  Musculoskeletal:        General: No  swelling. Normal range of motion.     Cervical back: Normal range of motion. No rigidity.  Skin:    General: Skin is warm and dry.  Neurological:     General: No focal deficit present.     Mental Status: He is alert.     Gait: Gait normal.  Psychiatric:        Cognition and Memory: Cognition is impaired. Memory is impaired.     Review of Systems  Constitutional: Negative for activity change and appetite change.  HENT: Negative for rhinorrhea and sore throat.   Eyes: Negative for photophobia.  Respiratory: Negative for cough and shortness of breath.   Cardiovascular: Negative for chest pain and palpitations.  Gastrointestinal: Negative for constipation and diarrhea.  Endocrine: Negative for cold intolerance and heat intolerance.  Genitourinary: Negative for  difficulty urinating and dysuria.  Musculoskeletal: Negative for back pain and neck pain.  Skin: Negative for rash and wound.  Allergic/Immunologic: Negative for food allergies and immunocompromised state.  Neurological: Negative for dizziness and seizures.  Hematological: Negative for adenopathy. Does not bruise/bleed easily.  Psychiatric/Behavioral: Positive for sleep disturbance. Negative for hallucinations and suicidal ideas.    Blood pressure 112/70, pulse 70, temperature 98.1 F (36.7 C), temperature source Oral, resp. rate 18, height 5\' 8"  (1.727 m), weight 104.3 kg, SpO2 100 %.Body mass index is 34.96 kg/m.  General Appearance: Casual  Eye Contact:  Good  Speech:  Normal Rate  Volume:  Normal  Mood:  Euthymic  Affect:  Congruent  Thought Process:  Goal Directed  Orientation:  Full (Time, Place, and Person)  Thought Content:  Logical  Suicidal Thoughts:  No  Homicidal Thoughts:  No  Memory:  Immediate;   Fair Recent;   Fair Remote;   Fair  Judgement:  Intact  Insight:  Fair  Psychomotor Activity:  Normal  Concentration:  Concentration: Fair  Recall:  [employer] of Knowledge:  Fair  Language:  Fair  Akathisia:  Negative  Handed:  Right  AIMS (if indicated):     Assets:  Desire for Improvement Leisure Time Physical Health Social Support Talents/Skills  ADL's:  Impaired  Cognition:  Impaired,  Mild  Sleep:  Number of Hours: 4     Treatment Plan Summary: Daily contact with patient to assess and evaluate symptoms and progress in treatment, Medication management and Plan continue medications as above.  Salley Scarlet, MD 04/20/2020, 11:45 AM

## 2020-04-20 NOTE — Tx Team (Signed)
Interdisciplinary Treatment and Diagnostic Plan Update  04/20/2020 Time of Session: 8:30 AM  Alexander Beard MRN: [id number]  Principal Diagnosis: Dementia Alexander Beard)  Secondary Diagnoses: Principal Problem:   Dementia (Alexander Beard) Active Problems:   Schizoaffective disorder, bipolar type (Alexander Beard)   Active autistic disorder   Diabetes (Alexander Beard)   Hypertension   Prostate hypertrophy   Intellectual disability   At risk for elopement   Current Medications:  Current Facility-Administered Medications  Medication Dose Route Frequency Provider Last Rate Last Admin  . acetaminophen (TYLENOL) tablet 650 mg  650 mg Oral Q6H PRN Alexander Post, MD      . alum & mag hydroxide-simeth (MAALOX/MYLANTA) 200-200-20 MG/5ML suspension 30 mL  30 mL Oral Q4H PRN Alexander Post, MD      . aspirin EC tablet 81 mg  81 mg Oral Daily [name], [name], MD   81 mg at 04/20/20 6967  . clonazePAM (KLONOPIN) tablet 1 mg  1 mg Oral QID [name], [name], MD   1 mg at 04/20/20 8938  . divalproex (DEPAKOTE ER) 24 hr tablet 1,500 mg  1,500 mg Oral QHS [name], [name], MD   1,500 mg at 04/19/20 2112  . gabapentin (NEURONTIN) capsule 100 mg  100 mg Oral TID [name], [name], MD   100 mg at 04/20/20 0823  . hydrOXYzine (ATARAX/VISTARIL) tablet 10 mg  10 mg Oral TID PRN Alexander Post, MD   10 mg at 04/16/20 0904  . influenza vac split quadrivalent PF (FLUARIX) injection 0.5 mL  0.5 mL Intramuscular Tomorrow-1000 [name], [name], MD      . lamoTRIgine (LAMICTAL) tablet 100 mg  100 mg Oral QHS [name], [name], MD   100 mg at 04/19/20 2112  . linagliptin (TRADJENTA) tablet 5 mg  5 mg Oral Daily [name], [name], MD   5 mg at 04/20/20 1017  . loperamide (IMODIUM) capsule 2 mg  2 mg Oral Q4H PRN [name], MD   2 mg at 04/10/20 0917  . loratadine (CLARITIN) tablet 10 mg  10 mg Oral Daily [name], [name], MD   10 mg at 04/20/20 5102  . LORazepam (ATIVAN) tablet 2 mg  2 mg Oral Q6H PRN [name], [name], MD   2 mg at 04/07/20 1240  .  magnesium hydroxide (MILK OF MAGNESIA) suspension 30 mL  30 mL Oral Daily PRN Alexander Post, MD   30 mL at 03/23/20 1344  . metFORMIN (GLUCOPHAGE) tablet 850 mg  850 mg Oral BID WC [name], [name], MD   850 mg at 04/20/20 0824  . metoprolol succinate (TOPROL-XL) 24 hr tablet 50 mg  50 mg Oral Daily [name], [name], MD   50 mg at 04/20/20 0824  . ondansetron (ZOFRAN) tablet 4 mg  4 mg Oral Q8H PRN [name], [name], MD   4 mg at 04/17/20 0344  . oxybutynin (DITROPAN) tablet 10 mg  10 mg Oral BID [name], [name], MD   10 mg at 04/20/20 0823  . pantoprazole (PROTONIX) EC tablet 40 mg  40 mg Oral Daily [name], [name], MD   40 mg at 04/20/20 5852  . prazosin (MINIPRESS) capsule 1 mg  1 mg Oral QHS [name], [name], MD   1 mg at 04/17/20 2215  . QUEtiapine (SEROQUEL) tablet 100 mg  100 mg Oral QID [name], [name], MD   100 mg at 04/20/20 0823  . QUEtiapine (SEROQUEL) tablet 400 mg  400 mg Oral QHS [name], NP  400 mg at 04/19/20 2111  . sertraline (ZOLOFT) tablet 50 mg  50 mg Oral BID [name], [name], MD   50 mg at 04/20/20 5188  . simethicone (MYLICON) chewable tablet 80 mg  80 mg Oral TID [name], [name], MD   80 mg at 04/20/20 4166  . simvastatin (ZOCOR) tablet 20 mg  20 mg Oral q1800 [name], [name], MD   20 mg at 04/19/20 1755  . tamsulosin (FLOMAX) capsule 0.8 mg  0.8 mg Oral QPC supper [name], [name], MD   0.8 mg at 04/19/20 1754   PTA Medications: Medications Prior to Admission  Medication Sig Dispense Refill Last Dose  . acetaminophen (TYLENOL) 650 MG CR tablet Take 650 mg by mouth every 12 (twelve) hours as needed for pain.     Marland Kitchen aspirin EC 81 MG tablet Take 81 mg by mouth daily.     . Calcium Carbonate-Vitamin D3 (CALCIUM 600-D) 600-400 MG-UNIT TABS Take 1 tablet by mouth 2 (two) times daily.     . Cholecalciferol (VITAMIN D-1000 MAX ST) 1000 UNITS tablet Take 1,000 Units by mouth daily.      Marland Kitchen docusate sodium (COLACE) 100 MG capsule Take 100 mg by mouth daily.      .  furosemide (LASIX) 20 MG tablet Take 20 mg by mouth daily.      Marland Kitchen gabapentin (NEURONTIN) 100 MG capsule Take 1 capsule (100 mg total) by mouth 3 (three) times daily. 90 capsule 10   . Insulin Detemir (LEVEMIR FLEXTOUCH) 100 UNIT/ML Pen Inject 15 Units into the skin daily at 10 pm.     . lamoTRIgine (LAMICTAL) 100 MG tablet Take 1 tablet (100 mg total) by mouth at bedtime. 30 tablet 10   . loratadine (CLARITIN) 10 MG tablet Take 1 tablet (10 mg total) by mouth daily. 30 tablet 5   . LORazepam (ATIVAN) 1 MG tablet Take 1 tablet (1 mg total) by mouth 3 (three) times daily. 90 tablet 5   . lurasidone (LATUDA) 40 MG TABS tablet Take 1 tablet (40 mg total) by mouth daily. with food 30 tablet 10   . metoprolol succinate (TOPROL-XL) 50 MG 24 hr tablet Take 50 mg by mouth daily.      . NON FORMULARY cpap device     . oxybutynin (DITROPAN) 5 MG tablet Take 5 mg by mouth daily.     . pantoprazole (PROTONIX) 40 MG tablet Take 40 mg by mouth daily.     . polyethylene glycol (MIRALAX / GLYCOLAX) packet Take 17 g by mouth daily.     . QUEtiapine (SEROQUEL) 100 MG tablet TAKE 1 TABLET  BY MOUTH THREE TIMES A DAY (Patient taking differently: Take 100 mg by mouth 3 (three) times daily. TAKE 1 TABLET  BY MOUTH THREE TIMES A DAY) 90 tablet 11   . QUEtiapine (SEROQUEL) 400 MG tablet Take 1 tablet (400 mg total) by mouth at bedtime. 30 tablet 10   . sertraline (ZOLOFT) 100 MG tablet Take 2 tablets (200 mg total) by mouth daily. (Patient taking differently: Take 200 mg by mouth daily. Take along with two 50 mg tablets (100 mg) for total 300 mg daily) 60 tablet 5   . sertraline (ZOLOFT) 50 MG tablet Take 2 tablets (100 mg total) by mouth daily. (Patient taking differently: Take 100 mg by mouth daily. Take along with two 100 mg tablets (200 mg) for total 300 mg daily) 60 tablet 5   . simethicone (MYLICON) 80 MG chewable tablet  Chew 80-160 mg by mouth 2 (two) times daily as needed for flatulence.     . simvastatin (ZOCOR) 20  MG tablet Take 20 mg by mouth at bedtime.      . sitaGLIPtin (JANUVIA) 100 MG tablet Take 100 mg by mouth daily.     . solifenacin (VESICARE) 5 MG tablet Take 5 mg by mouth daily.     . Starch (HEMORRHOIDAL RE) Place 1 application rectally 4 (four) times daily as needed (for itching).     . tamsulosin (FLOMAX) 0.4 MG CAPS capsule Take 0.4 mg by mouth daily.       Patient Stressors: Health problems Marital or family conflict Medication change or noncompliance Traumatic event  Patient Strengths: Motivation for treatment/growth Religious Affiliation  Treatment Modalities: Medication Management, Group therapy, Case management,  1 to 1 session with clinician, Psychoeducation, Recreational therapy.   Physician Treatment Plan for Primary Diagnosis: Dementia [facility] [place]) Long Term Goal(s): Improvement in symptoms so as ready for discharge Improvement in symptoms so as ready for discharge   Short Term Goals: Ability to verbalize feelings will improve Ability to demonstrate self-control will improve Ability to maintain clinical measurements within normal limits will improve Compliance with prescribed medications will improve  Medication Management: Evaluate patient's response, side effects, and tolerance of medication regimen.  Therapeutic Interventions: 1 to 1 sessions, Unit Group sessions and Medication administration.  Evaluation of Outcomes: Progressing   Physician Treatment Plan for Secondary Diagnosis: Principal Problem:   Dementia ([place]) Active Problems:   Schizoaffective disorder, bipolar type ([place])   Active autistic disorder   Diabetes ([place])   Hypertension   Prostate hypertrophy   Intellectual disability   At risk for elopement  Long Term Goal(s): Improvement in symptoms so as ready for discharge Improvement in symptoms so as ready for discharge   Short Term Goals: Ability to verbalize feelings will improve Ability to demonstrate self-control will improve Ability to maintain  clinical measurements within normal limits will improve Compliance with prescribed medications will improve     Medication Management: Evaluate patient's response, side effects, and tolerance of medication regimen.  Therapeutic Interventions: 1 to 1 sessions, Unit Group sessions and Medication administration.  Evaluation of Outcomes: Progressing   RN Treatment Plan for Primary Diagnosis: Dementia ([place]) Long Term Goal(s): Knowledge of disease and therapeutic regimen to maintain health will improve  Short Term Goals: Ability to demonstrate self-control, Ability to participate in decision making will improve, Ability to verbalize feelings will improve, Ability to disclose and discuss suicidal ideas, Ability to identify and develop effective coping behaviors will improve and Compliance with prescribed medications will improve  Medication Management: RN will administer medications as ordered by provider, will assess and evaluate patient's response and provide education to patient for prescribed medication. RN will report any adverse and/or side effects to prescribing provider.  Therapeutic Interventions: 1 on 1 counseling sessions, Psychoeducation, Medication administration, Evaluate responses to treatment, Monitor vital signs and CBGs as ordered, Perform/monitor CIWA, COWS, AIMS and Fall Risk screenings as ordered, Perform wound care treatments as ordered.  Evaluation of Outcomes: Progressing   LCSW Treatment Plan for Primary Diagnosis: Dementia ([place]) Long Term Goal(s): Safe transition to appropriate next level of care at discharge, Engage patient in therapeutic group addressing interpersonal concerns.  Short Term Goals: Engage patient in aftercare planning with referrals and resources, Increase social support, Increase ability to appropriately verbalize feelings, Increase emotional regulation and Increase skills for wellness and recovery  Therapeutic Interventions: Assess for all discharge  needs, 1  to 1 time with Education officer, museum, Explore available resources and support systems, Assess for adequacy in community support network, Educate family and significant other(s) on suicide prevention, Complete Psychosocial Assessment, Interpersonal group therapy.  Evaluation of Outcomes: Progressing   Progress in Treatment: Attending groups: Yes. Participating in groups: Yes. Taking medication as prescribed: Yes. Toleration medication: Yes. Family/Significant other contact made: Yes, individual(s) contacted:  Alexander Beard, brother  Patient understands diagnosis: Yes. Discussing patient identified problems/goals with staff: Yes. Medical problems stabilized or resolved: Yes. Denies suicidal/homicidal ideation: Yes. Issues/concerns per patient self-inventory: No. Other: N/a  New problem(s) identified: No, Describe:  None   New Short Term/Long Term Goal(s): Elimination of symptoms of psychosis, medication management for mood stabilization; elimination of SI thoughts; development of comprehensive mental wellness/sobriety plan.Update 03/01/20:No changes at this time. 03/07/20: Update, no changes at this time Update 03/12/2020: No changes at this time.Update 03/17/2020:No changes at this time. Update 03/21/20: No changes at this time. Update 03/26/20:  No changes at this time. Update 03/31/2020:  No changes at this time. Update 04/04/20: No changes at this time Update 04/09/2020:  No changes at this time.  Update 04/14/2020:  No changes at this time.  Update 04/20/2020:  No changes at this time    Patient Goals:  Patient stated he would like to go back to [place] and reconnect with his peer support. Patient also stated that he would like to increase his social support and help his brother.Update 03/07/20,no change Update 03/12/2020: No changes at this time. Update 03/17/2020:No changes at this time.Update 03/21/20: No changes at this time. Update 03/26/20:  No changes at this time. Update 03/31/2020:  No  changes at this time. Update 04/04/20: No changes at this time.  Update 04/09/2020:  No changes at this time.  Update 04/14/2020:  No changes at this time.  Update 04/20/2020:  No changes at this time  Discharge Plan or Barriers: Patient has an interview with a group home on 03/09/20 at 2:00 PM. Update 03/12/2020: CSW continues to assist the patient in developing a housing plan.Update 03/17/2020:CSW has assisted patient in completing an interview with a group home, however, there has been no word on if the patient has been approved. Patient continues to struggle with his anxiety and nerves. Nurses report that he has increased in bed wetting. Psychiatrist and nurses believe that this may be related to his increased anxiety about finding a home. Update 03/21/20: Patient is still nervous and anxious about finding a new placement. Patient stated he could not stay here too much longer and wanted his life back. Update 03/26/20: CSW continues to look for placement for pt.  Group Homes, [place] are being considered. Update 03/31/2020:  Patient continues to have interview with group homes, however, no success at this time in identifying a home that has accepted him. Update: 04/04/20: No changes at this time.  Update 04/09/2020:  CSW continues to look for placement for patient.  Update 04/14/2020:  No changes at this time.  CSW continues to look for placement.  Update 04/20/2020:  CSW continues to assist with placement needs.  Reason for Continuation of Hospitalization: Anxiety Depression Medication stabilization  Estimated Length of Stay: TBD  Attendees: Patient: 04/20/2020 11:25 AM  Physician: Dr. Domingo Cocking, MD 04/20/2020 11:25 AM  Nursing:  04/20/2020 11:25 AM  RN Care Manager: 04/20/2020 11:25 AM  Social Worker: Assunta Curtis, LCSW 04/20/2020 11:25 AM  Recreational Therapist:  04/20/2020 11:25 AM  Other: Trevor Iha, LCSW  04/20/2020 11:25 AM  Other:  04/20/2020  11:25 AM  Other: 04/20/2020 11:25 AM    Scribe for Treatment Team: [name], LCSW 04/20/2020 11:25 AM

## 2020-04-20 NOTE — NC FL2 (Signed)
[place] LEVEL OF CARE SCREENING TOOL     IDENTIFICATION  Patient Name: Alexander Beard Birthdate: 04-19-1956 Sex: male Admission Date (Current Location): 02/19/2020  [facility] [place] Number:  Engineering geologist and Address:  [facility], [address] [place], [place] [address]      Provider Number: [id number]  Attending Physician Name and Address:  Gonzella Lex, MD  Relative Name and Phone Number:       Current Level of Care: Hospital Recommended Level of Care: [facility], Other (Comment), [facility], Memory Care (Group Home) Prior Approval Number:    Date Approved/Denied:   PASRR Number:    Discharge Plan: Other (Comment) ([facility], Assisted Living, Group Home, Memory Care)    Current Diagnoses: Patient Active Problem List   Diagnosis Date Noted  . Dementia ([place]) 04/16/2020  . Intellectual disability 03/06/2020  . At risk for elopement 03/06/2020  . Diabetes ([place]) 04/10/2015  . Hypertension 04/10/2015  . Prostate hypertrophy 04/10/2015  . Schizoaffective disorder ([place]) 04/10/2015  . Enlarged prostate 04/10/2015  . Essential (primary) hypertension 04/10/2015  . Type 2 diabetes mellitus ([name]) 04/10/2015  . Controlled type 2 diabetes mellitus without complication ([place]) 25/42/7062  . Schizoaffective disorder, bipolar type ([place]) 12/29/2014  . Active autistic disorder 12/29/2014    Orientation RESPIRATION BLADDER Height & Weight     Self, Situation, Place, Time  Normal Incontinent Weight: 229 lb 15 oz (104.3 kg) Height:  5\' 8"  (172.7 cm)  BEHAVIORAL SYMPTOMS/MOOD NEUROLOGICAL BOWEL NUTRITION STATUS  Wanderer, Verbally abusive  (NA) Continent Diet (carb modified)  AMBULATORY STATUS COMMUNICATION OF NEEDS Skin   Independent Verbally Normal                       Personal Care Assistance Level of Assistance   (NA)           Functional Limitations Info   (NA)           SPECIAL CARE FACTORS FREQUENCY  Blood pressure (Diabetic, blood sugars maintained)                    Contractures Contractures Info: Not present    Additional Factors Info  Allergies, Code Status Code Status Info: Fu;; Allergies Info: Penicillins, Cortizone 10, "myacins"           Current Medications (04/20/2020):  This is the current hospital active medication list Current Facility-Administered Medications  Medication Dose Route Frequency Provider Last Rate Last Admin  . acetaminophen (TYLENOL) tablet 650 mg  650 mg Oral Q6H PRN [name], MD      . alum & mag hydroxide-simeth (MAALOX/MYLANTA) 200-200-20 MG/5ML suspension 30 mL  30 mL Oral Q4H PRN [name], MD      . aspirin EC tablet 81 mg  81 mg Oral Daily [name], [name], MD   81 mg at 04/20/20 3762  . clonazePAM (KLONOPIN) tablet 1 mg  1 mg Oral QID [name], [name], MD   1 mg at 04/20/20 8315  . divalproex (DEPAKOTE ER) 24 hr tablet 1,500 mg  1,500 mg Oral QHS [name], [name], MD   1,500 mg at 04/19/20 2112  . gabapentin (NEURONTIN) capsule 100 mg  100 mg Oral TID [name], [name], MD   100 mg at 04/20/20 0823  . hydrOXYzine (ATARAX/VISTARIL) tablet 10 mg  10 mg Oral TID PRN [name], MD   10 mg at 04/16/20 0904  .  influenza vac split quadrivalent PF (FLUARIX) injection 0.5 mL  0.5 mL Intramuscular Tomorrow-1000 [name], [name], MD      . lamoTRIgine (LAMICTAL) tablet 100 mg  100 mg Oral QHS [name], [name], MD   100 mg at 04/19/20 2112  . linagliptin (TRADJENTA) tablet 5 mg  5 mg Oral Daily [name], [name], MD   5 mg at 04/20/20 8921  . loperamide (IMODIUM) capsule 2 mg  2 mg Oral Q4H PRN [name], MD   2 mg at 04/10/20 0917  . loratadine (CLARITIN) tablet 10 mg  10 mg Oral Daily [name], [name], MD   10 mg at 04/20/20 1941  . LORazepam (ATIVAN) tablet 2 mg  2 mg Oral Q6H PRN [name], [name], MD   2 mg at 04/07/20 1240  . magnesium hydroxide (MILK OF MAGNESIA) suspension 30 mL  30 mL Oral  Daily PRN [name], MD   30 mL at 03/23/20 1344  . metFORMIN (GLUCOPHAGE) tablet 850 mg  850 mg Oral BID WC [name], [name], MD   850 mg at 04/20/20 0824  . metoprolol succinate (TOPROL-XL) 24 hr tablet 50 mg  50 mg Oral Daily [name], [name], MD   50 mg at 04/20/20 0824  . ondansetron (ZOFRAN) tablet 4 mg  4 mg Oral Q8H PRN [name], [name], MD   4 mg at 04/17/20 0344  . oxybutynin (DITROPAN) tablet 10 mg  10 mg Oral BID [name], [name], MD   10 mg at 04/20/20 0823  . pantoprazole (PROTONIX) EC tablet 40 mg  40 mg Oral Daily [name], [name], MD   40 mg at 04/20/20 7408  . prazosin (MINIPRESS) capsule 1 mg  1 mg Oral QHS [name], [name], MD   1 mg at 04/17/20 2215  . QUEtiapine (SEROQUEL) tablet 100 mg  100 mg Oral QID [name], [name], MD   100 mg at 04/20/20 0823  . QUEtiapine (SEROQUEL) tablet 400 mg  400 mg Oral QHS [name], NP   400 mg at 04/19/20 2111  . sertraline (ZOLOFT) tablet 50 mg  50 mg Oral BID [name], [name], MD   50 mg at 04/20/20 1448  . simethicone (MYLICON) chewable tablet 80 mg  80 mg Oral TID [name], [name], MD   80 mg at 04/20/20 1856  . simvastatin (ZOCOR) tablet 20 mg  20 mg Oral q1800 [name], [name], MD   20 mg at 04/19/20 1755  . tamsulosin (FLOMAX) capsule 0.8 mg  0.8 mg Oral QPC supper [name], [name], MD   0.8 mg at 04/19/20 1754     Discharge Medications: Please see discharge summary for a list of discharge medications.  Relevant Imaging Results:  Relevant Lab Results:   Additional Information    [name], LCSW

## 2020-04-21 DIAGNOSIS — F250 Schizoaffective disorder, bipolar type: Secondary | ICD-10-CM | POA: Diagnosis not present

## 2020-04-21 NOTE — Plan of Care (Signed)
  Problem: Activity: Goal: Sleeping patterns will improve Outcome: Progressing   Problem: Health Behavior/Discharge Planning: Goal: Compliance with treatment plan for underlying cause of condition will improve Outcome: Progressing   Problem: Safety: Goal: Periods of time without injury will increase Outcome: Progressing   

## 2020-04-21 NOTE — Progress Notes (Signed)
Patient has had a good shift. He was pleasant on approach He denies SI,HI and AVH. He was compliant with medications and had no behavioral issues to report on shift. He appears to be resting in the bed quietly, with no issues to report on shift at this time.

## 2020-04-21 NOTE — Progress Notes (Signed)
D: Pt alert and oriented. Pt denies experiencing any anxiety/depression at this time. Pt denies experiencing any pain at this time. Pt denies experiencing any SI/HI, or AVH at this time. Pt has been calm and cooperative today. Pt has not been at the nurse's station numerous times.  A: Scheduled medications administered to pt, per MD orders. Support and encouragement provided. Frequent verbal contact made. Routine safety checks conducted q15 minutes.   R: No adverse drug reactions noted. Pt verbally contracts for safety at this time. Pt complaint with medications and treatment plan. Pt interacts well with others on the unit. Pt remains safe at this time. Will continue to monitor.

## 2020-04-21 NOTE — BHH Counselor (Signed)
CSW attempted to call the [facility] Provider line [phone] to determine who is the patient's care coordinator and what progress had been made for the pt for the state funded group home list.   CSW left HIPAA compliant voicemail requesting a return phone call.  Assunta Curtis, MSW, LCSW 04/21/2020 12:01 PM

## 2020-04-21 NOTE — Progress Notes (Signed)
Recreation Therapy Notes  Date: 04/21/2020   Time: 1:30pm   Location: Courtyard    Behavioral response: Appropriate   Group Type: Leisure   Participation level: Active   Communication: Patient was social with peers and staff.   Comments: N/A   Alexander Beard LRT/CTRS        Wiliam Cauthorn 04/21/2020 3:08 PM

## 2020-04-21 NOTE — Progress Notes (Signed)
Recreation Therapy Notes  Date: 04/21/2020  Time: 9:30 am   Location: Craft room   Behavioral response: Appropriate  Intervention Topic: Relaxation   Discussion/Intervention:  Group content today was focused on relaxation. The group defined relaxation and identified healthy ways to relax. Individuals expressed how much time they spend relaxing. Patients expressed how much their life would be if they did not make time for themselves to relax. The group stated ways they could improve their relaxation techniques in the future.  Individuals participated in the intervention "Time to Relax" where they had a chance to experience different relaxation techniques.  Clinical Observations/Feedback: Patient came to group and defined relaxation as resting. He stated that he relaxes by sitting down and resting. Participant explained that he relaxes to help himself and others. Individual was social with peers and staff while participating in the intervention.  Kirby Cortese LRT/CTRS            Sadik Piascik 04/21/2020 11:39 AM

## 2020-04-21 NOTE — BHH Counselor (Signed)
CSW received a return call from Kathlee Nations [phone] from [facility] [place] [facility], "[facility]", [phone].  CSW was asked to send fax to [phone].  CSW sent the fax and received confirmation the fax was successful.  Assunta Curtis, MSW, LCSW 04/21/2020 3:06 PM

## 2020-04-21 NOTE — Progress Notes (Signed)
[facility] MD Progress Note  04/21/2020 4:09 PM Alexander Beard  MRN:  [id number] Subjective: No new complaints Principal Problem: Dementia ([place]) Diagnosis: Principal Problem:   Dementia ([place]) Active Problems:   Schizoaffective disorder, bipolar type ([place])   Active autistic disorder   Diabetes ([place])   Hypertension   Prostate hypertrophy   Intellectual disability   At risk for elopement  Total Time spent with patient: 15 minutes  Past Psychiatric History: Chronic illness  Past Medical History:  Past Medical History:  Diagnosis Date  . Anxiety   . Anxiety disorder due to known physiological condition    HOSPITALIZED 10/18  . Arthritis   . Autistic disorder, residual state   . COPD (chronic obstructive pulmonary disease) ([place])   . Depression   . Developmental disorder   . Diabetes mellitus without complication ([place])   . Dyslipidemia   . Esophageal reflux   . HOH (hard of hearing)    MILDLY  . Hypertension   . Obesity   . Overactive bladder   . Palpitations    ANXIETY  . Schizophrenia, schizoaffective ([place])   . Sleep apnea   . Tremors of nervous system    HANDS DUE TO MEDICATIONS  . Urinary incontinence     Past Surgical History:  Procedure Laterality Date  . CATARACT EXTRACTION W/PHACO Left 11/14/2017   Procedure: CATARACT EXTRACTION PHACO AND INTRAOCULAR LENS PLACEMENT (IOC);  Surgeon: Birder Robson, MD;  Location: [facility] ORS;  Service: Ophthalmology;  Laterality: Left;  [place] 00:42 AP% 14.6 CDE 6.12 Fluid pack lot # [id number]  . COLONOSCOPY  01/28/2005  . COLONOSCOPY WITH PROPOFOL N/A 05/09/2016   Procedure: COLONOSCOPY WITH PROPOFOL;  Surgeon: Manya Silvas, MD;  Location: [facility] ENDOSCOPY;  Service: Endoscopy;  Laterality: N/A;  . ESOPHAGOGASTRODUODENOSCOPY N/A 05/09/2016   Procedure: ESOPHAGOGASTRODUODENOSCOPY (EGD);  Surgeon: Manya Silvas, MD;  Location: [facility] ENDOSCOPY;  Service: Endoscopy;  Laterality: N/A;  . FRACTURE SURGERY     ORIF SHOULDER  .  TONSILLECTOMY     Family History:  Family History  Problem Relation Age of Onset  . Hypertension Mother   . Stroke Father   . Heart Problems Father    Family Psychiatric  History: See previous Social History:  Social History   Substance and Sexual Activity  Alcohol Use No  . Alcohol/week: 0.0 standard drinks     Social History   Substance and Sexual Activity  Drug Use No    Social History   Socioeconomic History  . Marital status: Single    Spouse name: Not on file  . Number of children: Not on file  . Years of education: Not on file  . Highest education level: Not on file  Occupational History  . Not on file  Tobacco Use  . Smoking status: Never Smoker  . Smokeless tobacco: Never Used  Vaping Use  . Vaping Use: Never used  Substance and Sexual Activity  . Alcohol use: No    Alcohol/week: 0.0 standard drinks  . Drug use: No  . Sexual activity: Never  Other Topics Concern  . Not on file  Social History Narrative   The patient never finished high school but did get his GED. 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 He is currently in disability and [name] is his legal guardian. He has been living in group homes for many years.      No pending legal charges  Social Determinants of Health   Financial Resource Strain:   . Difficulty of Paying Living Expenses: Not on file  Food Insecurity:   . Worried About Charity fundraiser in the Last Year: Not on file  . Ran Out of Food in the Last Year: Not on file  Transportation Needs:   . Lack of Transportation (Medical): Not on file  . Lack of Transportation (Non-Medical): Not on file  Physical Activity:   . Days of Exercise per Week: Not on file  . Minutes of Exercise per Session: Not on file  Stress:   . Feeling of Stress : Not on file  Social Connections:   . Frequency of Communication with Friends and Family: Not on file  . Frequency of  Social Gatherings with Friends and Family: Not on file  . Attends Religious Services: Not on file  . Active Member of Clubs or Organizations: Not on file  . Attends Archivist Meetings: Not on file  . Marital Status: Not on file   Additional Social History:                         Sleep: Fair  Appetite:  Fair  Current Medications: Current Facility-Administered Medications  Medication Dose Route Frequency Provider Last Rate Last Admin  . acetaminophen (TYLENOL) tablet 650 mg  650 mg Oral Q6H PRN [name], MD      . alum & mag hydroxide-simeth (MAALOX/MYLANTA) 200-200-20 MG/5ML suspension 30 mL  30 mL Oral Q4H PRN [name], MD      . aspirin EC tablet 81 mg  81 mg Oral Daily Byran Bilotti, [name], MD   81 mg at 04/21/20 0807  . clonazePAM (KLONOPIN) tablet 1 mg  1 mg Oral QID Cowen Pesqueira, [name], MD   1 mg at 04/21/20 1258  . divalproex (DEPAKOTE ER) 24 hr tablet 1,500 mg  1,500 mg Oral QHS Shiela Bruns T, MD   1,500 mg at 04/20/20 2124  . gabapentin (NEURONTIN) capsule 100 mg  100 mg Oral TID Igor Bishop, [name], MD   100 mg at 04/21/20 1258  . hydrOXYzine (ATARAX/VISTARIL) tablet 10 mg  10 mg Oral TID PRN [name], MD   10 mg at 04/20/20 2128  . influenza vac split quadrivalent PF (FLUARIX) injection 0.5 mL  0.5 mL Intramuscular Tomorrow-1000 Khamani Fairley T, MD      . lamoTRIgine (LAMICTAL) tablet 100 mg  100 mg Oral QHS Resa Rinks T, MD   100 mg at 04/20/20 2124  . linagliptin (TRADJENTA) tablet 5 mg  5 mg Oral Daily Reiko Vinje, [name], MD   5 mg at 04/21/20 0807  . loperamide (IMODIUM) capsule 2 mg  2 mg Oral Q4H PRN [name], MD   2 mg at 04/10/20 0917  . loratadine (CLARITIN) tablet 10 mg  10 mg Oral Daily Lataja Newland, [name], MD   10 mg at 04/21/20 7494  . LORazepam (ATIVAN) tablet 2 mg  2 mg Oral Q6H PRN Vasti Yagi, [name], MD   2 mg at 04/07/20 1240  . magnesium hydroxide (MILK OF MAGNESIA) suspension 30 mL  30 mL Oral Daily PRN [name], MD    30 mL at 03/23/20 1344  . metFORMIN (GLUCOPHAGE) tablet 850 mg  850 mg Oral BID WC Nymir Ringler, [name], MD   850 mg at 04/21/20 4967  . metoprolol succinate (TOPROL-XL) 24 hr tablet 50 mg  50 mg Oral Daily Lavance Beazer, [name], MD  50 mg at 04/21/20 0808  . ondansetron (ZOFRAN) tablet 4 mg  4 mg Oral Q8H PRN Collis Thede, [name], MD   4 mg at 04/17/20 0344  . oxybutynin (DITROPAN) tablet 10 mg  10 mg Oral BID Promise Bushong, [name], MD   10 mg at 04/21/20 0981  . pantoprazole (PROTONIX) EC tablet 40 mg  40 mg Oral Daily Emiliya Chretien, [name], MD   40 mg at 04/21/20 1914  . prazosin (MINIPRESS) capsule 1 mg  1 mg Oral QHS Shenise Wolgamott, [name], MD   1 mg at 04/20/20 2125  . QUEtiapine (SEROQUEL) tablet 100 mg  100 mg Oral QID Lilienne Weins T, MD   100 mg at 04/21/20 1259  . QUEtiapine (SEROQUEL) tablet 400 mg  400 mg Oral QHS [name], NP   400 mg at 04/20/20 2127  . sertraline (ZOLOFT) tablet 50 mg  50 mg Oral BID Collie Kittel, [name], MD   50 mg at 04/21/20 0807  . simethicone (MYLICON) chewable tablet 80 mg  80 mg Oral TID Vivianna Piccini, [name], MD   80 mg at 04/21/20 1259  . simvastatin (ZOCOR) tablet 20 mg  20 mg Oral q1800 Jozi Malachi, [name], MD   20 mg at 04/20/20 1652  . tamsulosin (FLOMAX) capsule 0.8 mg  0.8 mg Oral QPC supper Flor Whitacre T, MD   0.8 mg at 04/20/20 1651    Lab Results: No results found for this or any previous visit (from the past 48 hour(s)).  Blood Alcohol level:  Lab Results  Component Value Date   ETH <10 01/22/2020   ETH <10 78/29/5621    Metabolic Disorder Labs: Lab Results  Component Value Date   HGBA1C 6.6 (H) 02/20/2020   MPG 142.72 02/20/2020   No results found for: PROLACTIN Lab Results  Component Value Date   CHOL 153 04/10/2015   TRIG 166 (H) 04/10/2015   HDL 50 04/10/2015   CHOLHDL 3.1 04/10/2015   VLDL 33 04/10/2015   LDLCALC 70 04/10/2015    Physical Findings: AIMS:  , ,  ,  ,    CIWA:    COWS:     Musculoskeletal: Strength & Muscle Tone: within normal  limits Gait & Station: normal Patient leans: N/A  Psychiatric Specialty Exam: Physical Exam Vitals reviewed.  Constitutional:      Appearance: He is well-developed.  HENT:     Head: Normocephalic and atraumatic.  Eyes:     Conjunctiva/sclera: Conjunctivae normal.     Pupils: Pupils are equal, round, and reactive to light.  Cardiovascular:     Heart sounds: Normal heart sounds.  Pulmonary:     Effort: Pulmonary effort is normal.  Abdominal:     Palpations: Abdomen is soft.  Musculoskeletal:        General: Normal range of motion.     Cervical back: Normal range of motion.  Skin:    General: Skin is warm and dry.  Neurological:     General: No focal deficit present.     Mental Status: He is alert.  Psychiatric:        Mood and Affect: Mood normal.     Review of Systems  Constitutional: Negative.   HENT: Negative.   Eyes: Negative.   Respiratory: Negative.   Cardiovascular: Negative.   Gastrointestinal: Negative.   Musculoskeletal: Negative.   Skin: Negative.   Neurological: Negative.   Psychiatric/Behavioral: Negative.     Blood pressure 124/79, pulse 71, temperature 98.1 F (36.7 C), temperature source  Oral, resp. rate 18, height 5\' 8"  (1.727 m), weight 104.3 kg, SpO2 99 %.Body mass index is 34.96 kg/m.  General Appearance: Casual  Eye Contact:  Good  Speech:  Clear and Coherent  Volume:  Normal  Mood:  Euthymic  Affect:  Constricted  Thought Process:  Goal Directed  Orientation:  Full (Time, Place, and Person)  Thought Content:  Logical  Suicidal Thoughts:  No  Homicidal Thoughts:  No  Memory:  Immediate;   Fair Recent;   Fair Remote;   Fair  Judgement:  Fair  Insight:  Fair  Psychomotor Activity:  Normal  Concentration:  Concentration: Fair  Recall:  [employer] of Knowledge:  Fair  Language:  Fair  Akathisia:  No  Handed:  Right  AIMS (if indicated):     Assets:  Communication Skills  ADL's:  Intact  Cognition:  WNL  Sleep:  Number of Hours:  4     Treatment Plan Summary: Plan Await placement  Alethia Berthold, MD 04/21/2020, 4:09 PM

## 2020-04-22 DIAGNOSIS — F250 Schizoaffective disorder, bipolar type: Secondary | ICD-10-CM | POA: Diagnosis not present

## 2020-04-22 NOTE — Progress Notes (Signed)
[facility] MD Progress Note  04/22/2020 12:50 PM Alexander Beard  MRN:  [id number] Subjective: Follow-up for this 64 year old man with dementia among other problems.  No new complaints today.  Reasonably good mood.  Interacts well with others.  Not causing any trouble.  Attends mostly to his art work.  No physical complaints.  Hygiene looks better today. Principal Problem: Dementia ([place]) Diagnosis: Principal Problem:   Dementia ([place]) Active Problems:   Schizoaffective disorder, bipolar type ([place])   Active autistic disorder   Diabetes ([place])   Hypertension   Prostate hypertrophy   Intellectual disability   At risk for elopement  Total Time spent with patient: 30 minutes  Past Psychiatric History: Past history of chronic intellectual disability with worsening dementia as he ages  Past Medical History:  Past Medical History:  Diagnosis Date  . Anxiety   . Anxiety disorder due to known physiological condition    HOSPITALIZED 10/18  . Arthritis   . Autistic disorder, residual state   . COPD (chronic obstructive pulmonary disease) ([place])   . Depression   . Developmental disorder   . Diabetes mellitus without complication ([place])   . Dyslipidemia   . Esophageal reflux   . HOH (hard of hearing)    MILDLY  . Hypertension   . Obesity   . Overactive bladder   . Palpitations    ANXIETY  . Schizophrenia, schizoaffective ([place])   . Sleep apnea   . Tremors of nervous system    HANDS DUE TO MEDICATIONS  . Urinary incontinence     Past Surgical History:  Procedure Laterality Date  . CATARACT EXTRACTION W/PHACO Left 11/14/2017   Procedure: CATARACT EXTRACTION PHACO AND INTRAOCULAR LENS PLACEMENT (IOC);  Surgeon: Birder Robson, MD;  Location: [facility] ORS;  Service: Ophthalmology;  Laterality: Left;  [place] 00:42 AP% 14.6 CDE 6.12 Fluid pack lot # [id number]  . COLONOSCOPY  01/28/2005  . COLONOSCOPY WITH PROPOFOL N/A 05/09/2016   Procedure: COLONOSCOPY WITH PROPOFOL;  Surgeon: Manya Silvas, MD;   Location: [facility] ENDOSCOPY;  Service: Endoscopy;  Laterality: N/A;  . ESOPHAGOGASTRODUODENOSCOPY N/A 05/09/2016   Procedure: ESOPHAGOGASTRODUODENOSCOPY (EGD);  Surgeon: Manya Silvas, MD;  Location: [facility] ENDOSCOPY;  Service: Endoscopy;  Laterality: N/A;  . FRACTURE SURGERY     ORIF SHOULDER  . TONSILLECTOMY     Family History:  Family History  Problem Relation Age of Onset  . Hypertension Mother   . Stroke Father   . Heart Problems Father    Family Psychiatric  History: See previous Social History:  Social History   Substance and Sexual Activity  Alcohol Use No  . Alcohol/week: 0.0 standard drinks     Social History   Substance and Sexual Activity  Drug Use No    Social History   Socioeconomic History  . Marital status: Single    Spouse name: Not on file  . Number of children: Not on file  . Years of education: Not on file  . Highest education level: Not on file  Occupational History  . Not on file  Tobacco Use  . Smoking status: Never Smoker  . Smokeless tobacco: Never Used  Vaping Use  . Vaping Use: Never used  Substance and Sexual Activity  . Alcohol use: No    Alcohol/week: 0.0 standard drinks  . Drug use: No  . Sexual activity: Never  Other Topics Concern  . Not on file  Social History Narrative   The patient never finished high school but did get his  GED. 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 He is currently in disability and [name] is his legal guardian. He has been living in group homes for many years.      No pending legal charges   Social Determinants of Health   Financial Resource Strain:   . Difficulty of Paying Living Expenses: Not on file  Food Insecurity:   . Worried About Charity fundraiser in the Last Year: Not on file  . Ran Out of Food in the Last Year: Not on file  Transportation Needs:   . Lack of Transportation (Medical): Not on file  . Lack of  Transportation (Non-Medical): Not on file  Physical Activity:   . Days of Exercise per Week: Not on file  . Minutes of Exercise per Session: Not on file  Stress:   . Feeling of Stress : Not on file  Social Connections:   . Frequency of Communication with Friends and Family: Not on file  . Frequency of Social Gatherings with Friends and Family: Not on file  . Attends Religious Services: Not on file  . Active Member of Clubs or Organizations: Not on file  . Attends Archivist Meetings: Not on file  . Marital Status: Not on file   Additional Social History:                         Sleep: Fair  Appetite:  Negative  Current Medications: Current Facility-Administered Medications  Medication Dose Route Frequency Provider Last Rate Last Admin  . acetaminophen (TYLENOL) tablet 650 mg  650 mg Oral Q6H PRN [name], MD      . alum & mag hydroxide-simeth (MAALOX/MYLANTA) 200-200-20 MG/5ML suspension 30 mL  30 mL Oral Q4H PRN [name], MD      . aspirin EC tablet 81 mg  81 mg Oral Daily Lindalee Huizinga, [name], MD   81 mg at 04/22/20 0815  . clonazePAM (KLONOPIN) tablet 1 mg  1 mg Oral QID Kimmie Berggren, [name], MD   1 mg at 04/22/20 1222  . divalproex (DEPAKOTE ER) 24 hr tablet 1,500 mg  1,500 mg Oral QHS Cornell Gaber T, MD   1,500 mg at 04/21/20 2116  . gabapentin (NEURONTIN) capsule 100 mg  100 mg Oral TID Jase Reep T, MD   100 mg at 04/22/20 1222  . hydrOXYzine (ATARAX/VISTARIL) tablet 10 mg  10 mg Oral TID PRN [name], MD   10 mg at 04/20/20 2128  . influenza vac split quadrivalent PF (FLUARIX) injection 0.5 mL  0.5 mL Intramuscular Tomorrow-1000 Melanie Pellot T, MD      . lamoTRIgine (LAMICTAL) tablet 100 mg  100 mg Oral QHS Lucky Trotta T, MD   100 mg at 04/21/20 2115  . linagliptin (TRADJENTA) tablet 5 mg  5 mg Oral Daily Dezaray Shibuya, [name], MD   5 mg at 04/22/20 0816  . loperamide (IMODIUM) capsule 2 mg  2 mg Oral Q4H PRN [name], MD   2 mg at  04/10/20 0917  . loratadine (CLARITIN) tablet 10 mg  10 mg Oral Daily Roosvelt Churchwell, [name], MD   10 mg at 04/22/20 0816  . LORazepam (ATIVAN) tablet 2 mg  2 mg Oral Q6H PRN Reyana Leisey, [name], MD   2 mg at 04/07/20 1240  . magnesium hydroxide (MILK OF MAGNESIA) suspension 30 mL  30 mL Oral Daily  PRN [name], MD   30 mL at 03/23/20 1344  . metFORMIN (GLUCOPHAGE) tablet 850 mg  850 mg Oral BID WC Joan Avetisyan, [name], MD   850 mg at 04/22/20 0816  . metoprolol succinate (TOPROL-XL) 24 hr tablet 50 mg  50 mg Oral Daily Deaunna Olarte, [name], MD   50 mg at 04/22/20 0816  . ondansetron (ZOFRAN) tablet 4 mg  4 mg Oral Q8H PRN Xhaiden Coombs, [name], MD   4 mg at 04/17/20 0344  . oxybutynin (DITROPAN) tablet 10 mg  10 mg Oral BID Anwar Sakata, [name], MD   10 mg at 04/22/20 0817  . pantoprazole (PROTONIX) EC tablet 40 mg  40 mg Oral Daily Jozlin Bently T, MD   40 mg at 04/22/20 0815  . prazosin (MINIPRESS) capsule 1 mg  1 mg Oral QHS Gara Kincade, [name], MD   1 mg at 04/21/20 2116  . QUEtiapine (SEROQUEL) tablet 100 mg  100 mg Oral QID Natalia Wittmeyer, [name], MD   100 mg at 04/22/20 1221  . QUEtiapine (SEROQUEL) tablet 400 mg  400 mg Oral QHS [name], NP   400 mg at 04/21/20 2115  . sertraline (ZOLOFT) tablet 50 mg  50 mg Oral BID Suha Schoenbeck, [name], MD   50 mg at 04/22/20 0817  . simethicone (MYLICON) chewable tablet 80 mg  80 mg Oral TID Nova Evett, [name], MD   80 mg at 04/22/20 1222  . simvastatin (ZOCOR) tablet 20 mg  20 mg Oral q1800 Emalee Knies, [name], MD   20 mg at 04/21/20 1718  . tamsulosin (FLOMAX) capsule 0.8 mg  0.8 mg Oral QPC supper Willia Lampert T, MD   0.8 mg at 04/21/20 1717    Lab Results: No results found for this or any previous visit (from the past 48 hour(s)).  Blood Alcohol level:  Lab Results  Component Value Date   ETH <10 01/22/2020   ETH <10 09/38/1829    Metabolic Disorder Labs: Lab Results  Component Value Date   HGBA1C 6.6 (H) 02/20/2020   MPG 142.72 02/20/2020   No results found for:  PROLACTIN Lab Results  Component Value Date   CHOL 153 04/10/2015   TRIG 166 (H) 04/10/2015   HDL 50 04/10/2015   CHOLHDL 3.1 04/10/2015   VLDL 33 04/10/2015   LDLCALC 70 04/10/2015    Physical Findings: AIMS:  , ,  ,  ,    CIWA:    COWS:     Musculoskeletal: Strength & Muscle Tone: within normal limits Gait & Station: normal Patient leans: N/A  Psychiatric Specialty Exam: Physical Exam Vitals and nursing note reviewed.  Constitutional:      Appearance: He is well-developed.  HENT:     Head: Normocephalic and atraumatic.  Eyes:     Conjunctiva/sclera: Conjunctivae normal.     Pupils: Pupils are equal, round, and reactive to light.  Cardiovascular:     Heart sounds: Normal heart sounds.  Pulmonary:     Effort: Pulmonary effort is normal.  Abdominal:     Palpations: Abdomen is soft.  Musculoskeletal:        General: Normal range of motion.     Cervical back: Normal range of motion.  Skin:    General: Skin is warm and dry.  Neurological:     General: No focal deficit present.     Mental Status: He is alert.  Psychiatric:        Mood and Affect: Mood normal.  Review of Systems  Constitutional: Negative.   HENT: Negative.   Eyes: Negative.   Respiratory: Negative.   Cardiovascular: Negative.   Gastrointestinal: Negative.   Musculoskeletal: Negative.   Skin: Negative.   Neurological: Negative.   Psychiatric/Behavioral: Negative.     Blood pressure 118/73, pulse 61, temperature 97.6 F (36.4 C), temperature source Oral, resp. rate 17, height 5\' 8"  (1.727 m), weight 104.3 kg, SpO2 96 %.Body mass index is 34.96 kg/m.  General Appearance: Casual  Eye Contact:  Good  Speech:  Clear and Coherent  Volume:  Normal  Mood:  Euthymic  Affect:  Congruent  Thought Process:  Goal Directed  Orientation:  Full (Time, Place, and Person)  Thought Content:  Logical  Suicidal Thoughts:  No  Homicidal Thoughts:  No  Memory:  Immediate;   Fair Recent;    Fair Remote;   Fair  Judgement:  Fair  Insight:  Fair  Psychomotor Activity:  Normal  Concentration:  Concentration: Fair  Recall:  [employer] of Knowledge:  Fair  Language:  Fair  Akathisia:  No  Handed:  Right  AIMS (if indicated):     Assets:  Desire for Improvement  ADL's:  Intact  Cognition:  Impaired,  Mild  Sleep:  Number of Hours: 5.3     Treatment Plan Summary: Daily contact with patient to assess and evaluate symptoms and progress in treatment, Medication management and Plan No change to medication management.  Mood appears to be improved and stabilizing.  We will check a valproic acid level in the next day or so.  Continue to await plans for disposition.  Alethia Berthold, MD 04/22/2020, 12:50 PM

## 2020-04-22 NOTE — Progress Notes (Signed)
D: Pt alert and oriented. Pt rates depression 0/10, hopelessness 0/10, and anxiety 0/10. Pt goal: "being alert, staying busy, going outside, and discharge." Pt reports energy level as normal and concentration as being good. Pt reports sleep last night as being good. Pt did not receive medications for sleep. Pt denies experiencing any pain at this time. Pt denies experiencing any SI/HI, or AVH at this time.   Pt shares he's anxious about a place being found for him to stay, however remains calm and cooperative. Pt appears less preoccupied about where the nursing student are and if they're coming, Pt only asked twice about them.  A: Scheduled medications administered to pt, per MD orders. Support and encouragement provided. Frequent verbal contact made. Routine safety checks conducted q15 minutes.   R: No adverse drug reactions noted. Pt verbally contracts for safety at this time. Pt complaint with medications and treatment plan. Pt interacts well with others on the unit. Pt remains safe at this time. Will continue to monitor.

## 2020-04-22 NOTE — Progress Notes (Signed)
Patient calm and cooperative, in no distress. In dayroom drawing most of awake time. Appropriate with staff and peers. Medication compliant. Denies SI, HI, AVH. In bed resting remainder of shift, eyes closed. Pt remains safe on unit with q 15 min checks.

## 2020-04-22 NOTE — Plan of Care (Signed)
  Problem: Group Participation Goal: STG - Patient will engage in groups with a calm and appropriate mood at least 2x within 5 recreation therapy group sessions Description: STG - Patient will engage in groups with a calm and appropriate mood at least 2x within 5 recreation therapy group sessions Outcome: Completed/Met

## 2020-04-22 NOTE — [facility] Group Notes (Signed)
LCSW Group Therapy Note  04/22/2020 3:00 PM  Type of Therapy/Topic:  Group Therapy:  Emotion Regulation  Participation Level:  Active   Description of Group:   The purpose of this group is to assist patients in learning to regulate negative emotions and experience positive emotions. Patients will be guided to discuss ways in which they have been vulnerable to their negative emotions. These vulnerabilities will be juxtaposed with experiences of positive emotions or situations, and patients will be challenged to use positive emotions to combat negative ones. Special emphasis will be placed on coping with negative emotions in conflict situations, and patients will process healthy conflict resolution skills.  Therapeutic Goals: 1. Patient will identify two positive emotions or experiences to reflect on in order to balance out negative emotions 2. Patient will label two or more emotions that they find the most difficult to experience 3. Patient will demonstrate positive conflict resolution skills through discussion and/or role plays  Summary of Patient Progress: Patient was present in group.  Patient was attentive to group and supportive of others.  Patient discussed how if he does not manage his stress he becomes overwhelmed he feels the need "to run away and like my head will explode".  Patient was able to discuss how music and art has helped him to deescalate.    Therapeutic Modalities:   Cognitive Behavioral Therapy Feelings Identification Dialectical Behavioral Therapy  Assunta Curtis, MSW, LCSW 04/22/2020 3:00 PM

## 2020-04-22 NOTE — Progress Notes (Signed)
Recreation Therapy Notes  Date: 04/22/2020  Time: 9:30 am   Location: Craft room   Behavioral response: Appropriate  Intervention Topic: Problem-Solving   Discussion/Intervention:  Group content on today was focused on problem solving. The group described what problem solving is. Patients expressed how problems affect them and how they deal with problems. Individuals identified healthy ways to deal with problems. Patients explained what normally happens to them when they do not deal with problems. The group expressed reoccurring problems for them. The group participated in the intervention "Ways to Solve problems" where patients were given a chance to explore different ways to solve problems Clinical Observations/Feedback: Patient came to group and defined problem solving as asking for help. He stated that he could improve his problem-solving skills by doing things for himself. Participant expressed that problem solving is important to clear your mind. Individual was social with peers and staff while participating in the intervention.  Eden Toohey LRT/CTRS             Shavette Shoaff 04/22/2020 12:24 PM

## 2020-04-23 DIAGNOSIS — F250 Schizoaffective disorder, bipolar type: Secondary | ICD-10-CM | POA: Diagnosis not present

## 2020-04-23 NOTE — BHH Group Notes (Signed)
LCSW Group Therapy Note  04/23/2020 2:34 PM  Type of Therapy/Topic:  Group Therapy:  Balance in Life  Participation Level:  Active  Description of Group:    This group will address the concept of balance and how it feels and looks when one is unbalanced. Patients will be encouraged to process areas in their lives that are out of balance and identify reasons for remaining unbalanced. Facilitators will guide patients in utilizing problem-solving interventions to address and correct the stressor making their life unbalanced. Understanding and applying boundaries will be explored and addressed for obtaining and maintaining a balanced life. Patients will be encouraged to explore ways to assertively make their unbalanced needs known to significant others in their lives, using other group members and facilitator for support and feedback.  Therapeutic Goals: 1. Patient will identify two or more emotions or situations they have that consume much of in their lives. 2. Patient will identify signs/triggers that life has become out of balance:  3. Patient will identify two ways to set boundaries in order to achieve balance in their lives:  4. Patient will demonstrate ability to communicate their needs through discussion and/or role plays  Summary of Patient Progress: Patient was present in group.  Patient was an active participant in group.  Patient shared how ice cream, candy bars, information on dinosaurs and doing work has helped him to remain balanced.  He was able to engage in a conversation on the benefits of finding balance.    Therapeutic Modalities:   Cognitive Behavioral Therapy Solution-Focused Therapy Assertiveness Training  Assunta Curtis MSW, LCSW 04/23/2020 2:34 PM

## 2020-04-23 NOTE — Progress Notes (Signed)
[facility] MD Progress Note  04/23/2020 3:02 PM Alexander Beard  MRN:  [id number] Subjective: Follow-up for this patient with dementia and intellectual disability.  Patient has no new complaints today.  Mood is okay.  Anxious as usual but controlled.  Behavior has been calm today.  Good care for his ADLs.  Continues to show good insight and work on improving his behavior Principal Problem: Dementia ([place]) Diagnosis: Principal Problem:   Dementia ([place]) Active Problems:   Schizoaffective disorder, bipolar type ([place])   Active autistic disorder   Diabetes ([place])   Hypertension   Prostate hypertrophy   Intellectual disability   At risk for elopement  Total Time spent with patient: 30 minutes  Past Psychiatric History: Past history of longstanding intellectual disability and intermittent behavioral issues  Past Medical History:  Past Medical History:  Diagnosis Date  . Anxiety   . Anxiety disorder due to known physiological condition    HOSPITALIZED 10/18  . Arthritis   . Autistic disorder, residual state   . COPD (chronic obstructive pulmonary disease) ([place])   . Depression   . Developmental disorder   . Diabetes mellitus without complication ([place])   . Dyslipidemia   . Esophageal reflux   . HOH (hard of hearing)    MILDLY  . Hypertension   . Obesity   . Overactive bladder   . Palpitations    ANXIETY  . Schizophrenia, schizoaffective ([place])   . Sleep apnea   . Tremors of nervous system    HANDS DUE TO MEDICATIONS  . Urinary incontinence     Past Surgical History:  Procedure Laterality Date  . CATARACT EXTRACTION W/PHACO Left 11/14/2017   Procedure: CATARACT EXTRACTION PHACO AND INTRAOCULAR LENS PLACEMENT (IOC);  Surgeon: Birder Robson, MD;  Location: [facility] ORS;  Service: Ophthalmology;  Laterality: Left;  [place] 00:42 AP% 14.6 CDE 6.12 Fluid pack lot # [id number]  . COLONOSCOPY  01/28/2005  . COLONOSCOPY WITH PROPOFOL N/A 05/09/2016   Procedure: COLONOSCOPY WITH PROPOFOL;  Surgeon:  Manya Silvas, MD;  Location: [facility] ENDOSCOPY;  Service: Endoscopy;  Laterality: N/A;  . ESOPHAGOGASTRODUODENOSCOPY N/A 05/09/2016   Procedure: ESOPHAGOGASTRODUODENOSCOPY (EGD);  Surgeon: Manya Silvas, MD;  Location: [facility] ENDOSCOPY;  Service: Endoscopy;  Laterality: N/A;  . FRACTURE SURGERY     ORIF SHOULDER  . TONSILLECTOMY     Family History:  Family History  Problem Relation Age of Onset  . Hypertension Mother   . Stroke Father   . Heart Problems Father    Family Psychiatric  History: See previous Social History:  Social History   Substance and Sexual Activity  Alcohol Use No  . Alcohol/week: 0.0 standard drinks     Social History   Substance and Sexual Activity  Drug Use No    Social History   Socioeconomic History  . Marital status: Single    Spouse name: Not on file  . Number of children: Not on file  . Years of education: Not on file  . Highest education level: Not on file  Occupational History  . Not on file  Tobacco Use  . Smoking status: Never Smoker  . Smokeless tobacco: Never Used  Vaping Use  . Vaping Use: Never used  Substance and Sexual Activity  . Alcohol use: No    Alcohol/week: 0.0 standard drinks  . Drug use: No  . Sexual activity: Never  Other Topics Concern  . Not on file  Social History Narrative   The patient never finished high school but  did get his GED. 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 He is currently in disability and [name] is his legal guardian. He has been living in group homes for many years.      No pending legal charges   Social Determinants of Health   Financial Resource Strain:   . Difficulty of Paying Living Expenses: Not on file  Food Insecurity:   . Worried About Charity fundraiser in the Last Year: Not on file  . Ran Out of Food in the Last Year: Not on file  Transportation Needs:   . Lack of Transportation (Medical): Not on file   . Lack of Transportation (Non-Medical): Not on file  Physical Activity:   . Days of Exercise per Week: Not on file  . Minutes of Exercise per Session: Not on file  Stress:   . Feeling of Stress : Not on file  Social Connections:   . Frequency of Communication with Friends and Family: Not on file  . Frequency of Social Gatherings with Friends and Family: Not on file  . Attends Religious Services: Not on file  . Active Member of Clubs or Organizations: Not on file  . Attends Archivist Meetings: Not on file  . Marital Status: Not on file   Additional Social History:                         Sleep: Fair  Appetite:  Fair  Current Medications: Current Facility-Administered Medications  Medication Dose Route Frequency Provider Last Rate Last Admin  . acetaminophen (TYLENOL) tablet 650 mg  650 mg Oral Q6H PRN [name], MD      . alum & mag hydroxide-simeth (MAALOX/MYLANTA) 200-200-20 MG/5ML suspension 30 mL  30 mL Oral Q4H PRN [name], MD      . aspirin EC tablet 81 mg  81 mg Oral Daily Tawnie Ehresman, [name], MD   81 mg at 04/23/20 0806  . clonazePAM (KLONOPIN) tablet 1 mg  1 mg Oral QID Shavaun Osterloh, [name], MD   1 mg at 04/23/20 1213  . divalproex (DEPAKOTE ER) 24 hr tablet 1,500 mg  1,500 mg Oral QHS Eisa Conaway T, MD   1,500 mg at 04/22/20 2207  . gabapentin (NEURONTIN) capsule 100 mg  100 mg Oral TID Geraldyne Barraclough T, MD   100 mg at 04/23/20 1213  . hydrOXYzine (ATARAX/VISTARIL) tablet 10 mg  10 mg Oral TID PRN [name], MD   10 mg at 04/20/20 2128  . influenza vac split quadrivalent PF (FLUARIX) injection 0.5 mL  0.5 mL Intramuscular Tomorrow-1000 Mirra Basilio T, MD      . lamoTRIgine (LAMICTAL) tablet 100 mg  100 mg Oral QHS Glendola Friedhoff T, MD   100 mg at 04/22/20 2208  . linagliptin (TRADJENTA) tablet 5 mg  5 mg Oral Daily Mabry Tift, [name], MD   5 mg at 04/23/20 0806  . loperamide (IMODIUM) capsule 2 mg  2 mg Oral Q4H PRN [name], MD   2 mg  at 04/10/20 0917  . loratadine (CLARITIN) tablet 10 mg  10 mg Oral Daily Rheanna Sergent, [name], MD   10 mg at 04/23/20 0806  . LORazepam (ATIVAN) tablet 2 mg  2 mg Oral Q6H PRN Ethelyn Cerniglia, [name], MD   2 mg at 04/07/20 1240  . magnesium hydroxide (MILK OF MAGNESIA) suspension 30 mL  30  mL Oral Daily PRN [name], MD   30 mL at 03/23/20 1344  . metFORMIN (GLUCOPHAGE) tablet 850 mg  850 mg Oral BID WC Travelle Mcclimans, [name], MD   850 mg at 04/23/20 0805  . metoprolol succinate (TOPROL-XL) 24 hr tablet 50 mg  50 mg Oral Daily Janna Oak, [name], MD   50 mg at 04/23/20 0806  . ondansetron (ZOFRAN) tablet 4 mg  4 mg Oral Q8H PRN Dakayla Disanti, [name], MD   4 mg at 04/17/20 0344  . oxybutynin (DITROPAN) tablet 10 mg  10 mg Oral BID Jahel Wavra, [name], MD   10 mg at 04/23/20 0806  . pantoprazole (PROTONIX) EC tablet 40 mg  40 mg Oral Daily Oisin Yoakum, [name], MD   40 mg at 04/23/20 0806  . prazosin (MINIPRESS) capsule 1 mg  1 mg Oral QHS Rabiah Goeser, [name], MD   1 mg at 04/22/20 2208  . QUEtiapine (SEROQUEL) tablet 100 mg  100 mg Oral QID Quirino Kakos T, MD   100 mg at 04/23/20 1213  . QUEtiapine (SEROQUEL) tablet 400 mg  400 mg Oral QHS [name], NP   400 mg at 04/22/20 2208  . sertraline (ZOLOFT) tablet 50 mg  50 mg Oral BID Kashonda Sarkisyan, [name], MD   50 mg at 04/23/20 0806  . simethicone (MYLICON) chewable tablet 80 mg  80 mg Oral TID Yassir Enis T, MD   80 mg at 04/23/20 1213  . simvastatin (ZOCOR) tablet 20 mg  20 mg Oral q1800 Monserrat Vidaurri, [name], MD   20 mg at 04/22/20 1725  . tamsulosin (FLOMAX) capsule 0.8 mg  0.8 mg Oral QPC supper Lorriann Hansmann T, MD   0.8 mg at 04/22/20 1726    Lab Results: No results found for this or any previous visit (from the past 48 hour(s)).  Blood Alcohol level:  Lab Results  Component Value Date   ETH <10 01/22/2020   ETH <10 70/35/0093    Metabolic Disorder Labs: Lab Results  Component Value Date   HGBA1C 6.6 (H) 02/20/2020   MPG 142.72 02/20/2020   No results found for:  PROLACTIN Lab Results  Component Value Date   CHOL 153 04/10/2015   TRIG 166 (H) 04/10/2015   HDL 50 04/10/2015   CHOLHDL 3.1 04/10/2015   VLDL 33 04/10/2015   LDLCALC 70 04/10/2015    Physical Findings: AIMS:  , ,  ,  ,    CIWA:    COWS:     Musculoskeletal: Strength & Muscle Tone: within normal limits Gait & Station: normal Patient leans: N/A  Psychiatric Specialty Exam: Physical Exam Vitals and nursing note reviewed.  Constitutional:      Appearance: He is well-developed.  HENT:     Head: Normocephalic and atraumatic.  Eyes:     Conjunctiva/sclera: Conjunctivae normal.     Pupils: Pupils are equal, round, and reactive to light.  Cardiovascular:     Heart sounds: Normal heart sounds.  Pulmonary:     Effort: Pulmonary effort is normal.  Abdominal:     Palpations: Abdomen is soft.  Musculoskeletal:        General: Normal range of motion.     Cervical back: Normal range of motion.  Skin:    General: Skin is warm and dry.  Neurological:     General: No focal deficit present.     Mental Status: He is alert.  Psychiatric:        Mood and Affect: Mood normal.  Review of Systems  Constitutional: Negative.   HENT: Negative.   Eyes: Negative.   Respiratory: Negative.   Cardiovascular: Negative.   Gastrointestinal: Negative.   Musculoskeletal: Negative.   Skin: Negative.   Neurological: Negative.   Psychiatric/Behavioral: Negative.     Blood pressure 101/63, pulse 80, temperature 98.2 F (36.8 C), temperature source Oral, resp. rate 18, height 5\' 8"  (1.727 m), weight 104.3 kg, SpO2 94 %.Body mass index is 34.96 kg/m.  General Appearance: Casual  Eye Contact:  Good  Speech:  Clear and Coherent  Volume:  Normal  Mood:  Euthymic  Affect:  Congruent  Thought Process:  Coherent  Orientation:  Full (Time, Place, and Person)  Thought Content:  Logical  Suicidal Thoughts:  No  Homicidal Thoughts:  No  Memory:  Immediate;   Fair Recent;   Fair Remote;    Fair  Judgement:  Fair  Insight:  Fair  Psychomotor Activity:  Normal  Concentration:  Concentration: Fair  Recall:  [employer] of Knowledge:  Fair  Language:  Fair  Akathisia:  No  Handed:  Right  AIMS (if indicated):     Assets:  Desire for Improvement  ADL's:  Intact  Cognition:  WNL  Sleep:  Number of Hours: 5.3     Treatment Plan Summary: Plan No change to medication.  Supportive counseling and therapy and encouragement.  Treatment team continues to work on disposition  Alethia Berthold, MD 04/23/2020, 3:02 PM

## 2020-04-23 NOTE — Progress Notes (Signed)
Recreation Therapy Notes  Date: 04/23/2020  Time: 9:30 am  Location: Room 21    Behavioral response: Appropriate   Intervention Topic: Animal Assisted Therapy   Discussion/Intervention:  Animal Assisted Therapy took place today during group.  Animal Assisted Therapy is the planned inclusion of an animal in a patient's treatment plan. The patients were able to engage in therapy with an animal during group. Participants were educated on what a service dog is and the different between a support dog and a service dog. Patient were informed on the many animal needs there are and how their needs are similar. Individuals were enlightened on the process to get a service animal or support animal. Patients got the opportunity to pet the animal and were offered emotional support from the animal and staff.  Clinical Observations/Feedback:  Patient came to group and was on topic and was focused on what peers and staff had to say. Participant shared their experiences and history with animals. Individual was social with peers, staff and animal while participating in group.  Esparanza Krider LRT/CTRS         Daylani Deblois 04/23/2020 12:33 PM

## 2020-04-23 NOTE — BHH Counselor (Signed)
CSW spoke with [name], who is assisting with placement.  Per [name] she will provide a list of places for CSW to contact for the patient possible placement.  CSW asked if she could call/send the information as well and CSW was informed that due to billing concerns she could not call but could look the places up.   CSW spoke with the patient's Alexander Beard, brother, [phone].  CSW updated brother that pt had an interview and while the interview went well the patient was declined.  CSW updated that per [name] she was going to send a list of referrals for the patient for CSW to call and CSW was curious if [name] would be able to call as well.   Assunta Curtis, MSW, LCSW 04/23/2020 12:08 PM

## 2020-04-23 NOTE — Plan of Care (Signed)
Patient presents at baseline  Problem: Education: Goal: Emotional status will improve Outcome: Not Progressing Goal: Mental status will improve Outcome: Not Progressing   

## 2020-04-23 NOTE — Progress Notes (Signed)
Patient has had a good shift. He was pleasant on approach He denies SI,HI and AVH. He was compliant with medications and had no behavioral issues to report on shift. He spent of the evening in the dayroom sitting with peers. He seemed to sleep well through out the night.

## 2020-04-23 NOTE — Plan of Care (Signed)
Patient is active in the milieu, cooperative and active in groups. Denying thoughts of self harm. He is pleasant on approach and has no concerns so far. Patient reports that he slept well. His appetite is good. Received AM medications and was encouraged to communicate his thoughts and concerns as needed. Safety monitored as expected.

## 2020-04-23 NOTE — BHH Counselor (Signed)
CSW contacted the following in an effort to identify placement:  [facility], [phone] Group home is under [facility].  [facility] group homes listed.  [address] [phone] CSW left HIPAA compliant voicemail.   [facility], [phone] Line rang without the option of leaving HIPAA compliant voicemail.  [facility], [phone] CSW left HIPAA compliant voicemail.   [facility], [phone] Line rang then was disconnected.  CSW contacted several times.   [facility], [phone] Asked to call [phone] and speak with Claiborne Billings or [place].  Has one semi-private room and one single room. Asked to fax the information to [phone]. Patient declined due to elopement behaviors.   [place] [phone], [phone] CSW left HIPAA compliant voicemail.   [facility] No group homes listed.   [facility] No group homes listed.   [place], [phone] CSW left HIPAA compliant voicemail.   [facility], [phone] CSW left HIPAA compliant voicemail.   [facility] No group homes listed.   [place], [phone] Number listed is for some type of business.   [facility], [phone] CSW asked to call [phone] [place]. Asked to fax information to [phone].  [facility], [phone] Call could not be completed as dialed.  [facility], [phone] CSW left HIPAA compliant voicemail.   [facility], [phone] Possible bed available.  Asked to send [phone].  [facility] No group homes listed.

## 2020-04-23 NOTE — BHH Counselor (Signed)
CSW attempted to call Kathlee Nations [phone] from [facility], "[facility]", [phone] to check on the status of the patient's referral.  CSW left HIPAA compliant voicemail.  Assunta Curtis, MSW, LCSW 04/23/2020 10:58 AM

## 2020-04-23 NOTE — Progress Notes (Signed)
Patientcalm and pleasant this evening denying SI/HI/AVH. Patientdidn't have a panic attack this evening.Patient given support and encouragement. Patient compliant with medication administration per MD orders. Patient being monitored Q 15 minutes for safety per unit protocol. Pt remains safe on the unit.

## 2020-04-24 DIAGNOSIS — F250 Schizoaffective disorder, bipolar type: Secondary | ICD-10-CM | POA: Diagnosis not present

## 2020-04-24 NOTE — Plan of Care (Signed)
D- Patient alert and oriented. Patient presents in a pleasant mood on assessment stating that he slept "pretty good" last night and had no complaints to voice to this Probation officer. Patient denies SI, HI, AVH, and pain at this time. Patient also denies any signs/symptoms of depression/anxiety, stating that overall, he's feeling "pretty good". Patient's goal for today is "self-control".  A- Scheduled medications administered to patient, per MD orders. Support and encouragement provided.  Routine safety checks conducted every 15 minutes.  Patient informed to notify staff with problems or concerns.  R- No adverse drug reactions noted. Patient contracts for safety at this time. Patient compliant with medications and treatment plan. Patient receptive, calm, and cooperative. Patient interacts well with others on the unit.  Patient remains safe at this time.  Problem: Education: Goal: Knowledge of [place] [facility] Education information/materials will improve Outcome: Progressing Goal: Emotional status will improve Outcome: Progressing Goal: Mental status will improve Outcome: Progressing Goal: Verbalization of understanding the information provided will improve Outcome: Progressing   Problem: Activity: Goal: Interest or engagement in activities will improve Outcome: Progressing Goal: Sleeping patterns will improve Outcome: Progressing   Problem: Coping: Goal: Ability to verbalize frustrations and anger appropriately will improve Outcome: Progressing Goal: Ability to demonstrate self-control will improve Outcome: Progressing   Problem: Health Behavior/Discharge Planning: Goal: Identification of resources available to assist in meeting health care needs will improve Outcome: Progressing Goal: Compliance with treatment plan for underlying cause of condition will improve Outcome: Progressing   Problem: Physical Regulation: Goal: Ability to maintain clinical measurements within normal limits will  improve Outcome: Progressing   Problem: Safety: Goal: Periods of time without injury will increase Outcome: Progressing   Problem: Education: Goal: Ability to make informed decisions regarding treatment will improve Outcome: Progressing   Problem: Coping: Goal: Coping ability will improve Outcome: Progressing   Problem: Health Behavior/Discharge Planning: Goal: Identification of resources available to assist in meeting health care needs will improve Outcome: Progressing   Problem: Medication: Goal: Compliance with prescribed medication regimen will improve Outcome: Progressing   Problem: Self-Concept: Goal: Ability to disclose and discuss suicidal ideas will improve Outcome: Progressing Goal: Will verbalize positive feelings about self Outcome: Progressing   Problem: Coping: Goal: Coping ability will improve Outcome: Progressing Goal: Will verbalize feelings Outcome: Progressing   Problem: Coping: Goal: Ability to identify and develop effective coping behavior will improve Outcome: Progressing   Problem: Education: Goal: Utilization of techniques to improve thought processes will improve Outcome: Progressing Goal: Knowledge of the prescribed therapeutic regimen will improve Outcome: Progressing   Problem: Activity: Goal: Interest or engagement in leisure activities will improve Outcome: Progressing Goal: Imbalance in normal sleep/wake cycle will improve Outcome: Progressing   Problem: Coping: Goal: Coping ability will improve Outcome: Progressing Goal: Will verbalize feelings Outcome: Progressing   Problem: Health Behavior/Discharge Planning: Goal: Ability to make decisions will improve Outcome: Progressing Goal: Compliance with therapeutic regimen will improve Outcome: Progressing   Problem: Role Relationship: Goal: Will demonstrate positive changes in social behaviors and relationships Outcome: Progressing   Problem: Safety: Goal: Ability to  disclose and discuss suicidal ideas will improve Outcome: Progressing Goal: Ability to identify and utilize support systems that promote safety will improve Outcome: Progressing   Problem: Self-Concept: Goal: Will verbalize positive feelings about self Outcome: Progressing Goal: Level of anxiety will decrease Outcome: Progressing   Problem: Education: Goal: Ability to state activities that reduce stress will improve Outcome: Progressing   Problem: Coping: Goal: Ability to identify and develop effective  coping behavior will improve Outcome: Progressing   Problem: Self-Concept: Goal: Ability to identify factors that promote anxiety will improve Outcome: Progressing Goal: Level of anxiety will decrease Outcome: Progressing Goal: Ability to modify response to factors that promote anxiety will improve Outcome: Progressing

## 2020-04-24 NOTE — Progress Notes (Signed)
[facility] MD Progress Note  04/24/2020 11:56 AM Alexander Beard  MRN:  [id number] Subjective: Patient seen chart reviewed.  No new complaints.  Mood stable.  No behavior problems.  No psychosis.  Physically having no complaints Principal Problem: Dementia ([place]) Diagnosis: Principal Problem:   Dementia ([place]) Active Problems:   Schizoaffective disorder, bipolar type ([place])   Active autistic disorder   Diabetes ([place])   Hypertension   Prostate hypertrophy   Intellectual disability   At risk for elopement  Total Time spent with patient: 20 minutes  Past Psychiatric History: Past history of longstanding disability  Past Medical History:  Past Medical History:  Diagnosis Date  . Anxiety   . Anxiety disorder due to known physiological condition    HOSPITALIZED 10/18  . Arthritis   . Autistic disorder, residual state   . COPD (chronic obstructive pulmonary disease) ([place])   . Depression   . Developmental disorder   . Diabetes mellitus without complication ([place])   . Dyslipidemia   . Esophageal reflux   . HOH (hard of hearing)    MILDLY  . Hypertension   . Obesity   . Overactive bladder   . Palpitations    ANXIETY  . Schizophrenia, schizoaffective ([place])   . Sleep apnea   . Tremors of nervous system    HANDS DUE TO MEDICATIONS  . Urinary incontinence     Past Surgical History:  Procedure Laterality Date  . CATARACT EXTRACTION W/PHACO Left 11/14/2017   Procedure: CATARACT EXTRACTION PHACO AND INTRAOCULAR LENS PLACEMENT (IOC);  Surgeon: Birder Robson, MD;  Location: [facility] ORS;  Service: Ophthalmology;  Laterality: Left;  [place] 00:42 AP% 14.6 CDE 6.12 Fluid pack lot # [id number]  . COLONOSCOPY  01/28/2005  . COLONOSCOPY WITH PROPOFOL N/A 05/09/2016   Procedure: COLONOSCOPY WITH PROPOFOL;  Surgeon: Manya Silvas, MD;  Location: [facility] ENDOSCOPY;  Service: Endoscopy;  Laterality: N/A;  . ESOPHAGOGASTRODUODENOSCOPY N/A 05/09/2016   Procedure: ESOPHAGOGASTRODUODENOSCOPY (EGD);  Surgeon:  Manya Silvas, MD;  Location: [facility] ENDOSCOPY;  Service: Endoscopy;  Laterality: N/A;  . FRACTURE SURGERY     ORIF SHOULDER  . TONSILLECTOMY     Family History:  Family History  Problem Relation Age of Onset  . Hypertension Mother   . Stroke Father   . Heart Problems Father    Family Psychiatric  History: See previous Social History:  Social History   Substance and Sexual Activity  Alcohol Use No  . Alcohol/week: 0.0 standard drinks     Social History   Substance and Sexual Activity  Drug Use No    Social History   Socioeconomic History  . Marital status: Single    Spouse name: Not on file  . Number of children: Not on file  . Years of education: Not on file  . Highest education level: Not on file  Occupational History  . Not on file  Tobacco Use  . Smoking status: Never Smoker  . Smokeless tobacco: Never Used  Vaping Use  . Vaping Use: Never used  Substance and Sexual Activity  . Alcohol use: No    Alcohol/week: 0.0 standard drinks  . Drug use: No  . Sexual activity: Never  Other Topics Concern  . Not on file  Social History Narrative   The patient never finished high school but did get his GED. 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 He is currently in disability and [name]  is his legal guardian. He has been living in group homes for many years.      No pending legal charges   Social Determinants of Health   Financial Resource Strain:   . Difficulty of Paying Living Expenses: Not on file  Food Insecurity:   . Worried About Charity fundraiser in the Last Year: Not on file  . Ran Out of Food in the Last Year: Not on file  Transportation Needs:   . Lack of Transportation (Medical): Not on file  . Lack of Transportation (Non-Medical): Not on file  Physical Activity:   . Days of Exercise per Week: Not on file  . Minutes of Exercise per Session: Not on file  Stress:   . Feeling of  Stress : Not on file  Social Connections:   . Frequency of Communication with Friends and Family: Not on file  . Frequency of Social Gatherings with Friends and Family: Not on file  . Attends Religious Services: Not on file  . Active Member of Clubs or Organizations: Not on file  . Attends Archivist Meetings: Not on file  . Marital Status: Not on file   Additional Social History:                         Sleep: Fair  Appetite:  Fair  Current Medications: Current Facility-Administered Medications  Medication Dose Route Frequency Provider Last Rate Last Admin  . acetaminophen (TYLENOL) tablet 650 mg  650 mg Oral Q6H PRN [name], MD      . alum & mag hydroxide-simeth (MAALOX/MYLANTA) 200-200-20 MG/5ML suspension 30 mL  30 mL Oral Q4H PRN [name], MD      . aspirin EC tablet 81 mg  81 mg Oral Daily Carlota Philley, [name], MD   81 mg at 04/24/20 5956  . clonazePAM (KLONOPIN) tablet 1 mg  1 mg Oral QID Captola Teschner, [name], MD   1 mg at 04/24/20 3875  . divalproex (DEPAKOTE ER) 24 hr tablet 1,500 mg  1,500 mg Oral QHS Kynnadi Dicenso T, MD   1,500 mg at 04/23/20 2143  . gabapentin (NEURONTIN) capsule 100 mg  100 mg Oral TID Sonakshi Rolland, [name], MD   100 mg at 04/24/20 6433  . hydrOXYzine (ATARAX/VISTARIL) tablet 10 mg  10 mg Oral TID PRN [name], MD   10 mg at 04/20/20 2128  . influenza vac split quadrivalent PF (FLUARIX) injection 0.5 mL  0.5 mL Intramuscular Tomorrow-1000 Almeda Ezra T, MD      . lamoTRIgine (LAMICTAL) tablet 100 mg  100 mg Oral QHS Lennin Osmond T, MD   100 mg at 04/23/20 2144  . linagliptin (TRADJENTA) tablet 5 mg  5 mg Oral Daily Lisvet Rasheed, [name], MD   5 mg at 04/24/20 0832  . loperamide (IMODIUM) capsule 2 mg  2 mg Oral Q4H PRN [name], MD   2 mg at 04/10/20 0917  . loratadine (CLARITIN) tablet 10 mg  10 mg Oral Daily Eldra Word, [name], MD   10 mg at 04/24/20 2951  . LORazepam (ATIVAN) tablet 2 mg  2 mg Oral Q6H PRN Myiesha Edgar, [name], MD    2 mg at 04/07/20 1240  . magnesium hydroxide (MILK OF MAGNESIA) suspension 30 mL  30 mL Oral Daily PRN [name], MD   30 mL at 03/23/20 1344  . metFORMIN (GLUCOPHAGE) tablet 850 mg  850 mg Oral BID WC Anysia Choi, [name], MD  850 mg at 04/24/20 0833  . metoprolol succinate (TOPROL-XL) 24 hr tablet 50 mg  50 mg Oral Daily Margarito Dehaas, [name], MD   50 mg at 04/24/20 0833  . ondansetron (ZOFRAN) tablet 4 mg  4 mg Oral Q8H PRN Reve Crocket, [name], MD   4 mg at 04/17/20 0344  . oxybutynin (DITROPAN) tablet 10 mg  10 mg Oral BID Dae Antonucci, [name], MD   10 mg at 04/24/20 0998  . pantoprazole (PROTONIX) EC tablet 40 mg  40 mg Oral Daily Izabela Ow, [name], MD   40 mg at 04/24/20 0833  . prazosin (MINIPRESS) capsule 1 mg  1 mg Oral QHS Roshaunda Starkey T, MD   1 mg at 04/23/20 2148  . QUEtiapine (SEROQUEL) tablet 100 mg  100 mg Oral QID Farrah Skoda, [name], MD   100 mg at 04/24/20 0833  . QUEtiapine (SEROQUEL) tablet 400 mg  400 mg Oral QHS [name], NP   400 mg at 04/23/20 2144  . sertraline (ZOLOFT) tablet 50 mg  50 mg Oral BID Tahani Potier, [name], MD   50 mg at 04/24/20 3382  . simethicone (MYLICON) chewable tablet 80 mg  80 mg Oral TID Jaidalyn Schillo, [name], MD   80 mg at 04/24/20 5053  . simvastatin (ZOCOR) tablet 20 mg  20 mg Oral q1800 Lysa Livengood, [name], MD   20 mg at 04/23/20 1709  . tamsulosin (FLOMAX) capsule 0.8 mg  0.8 mg Oral QPC supper Shalice Woodring T, MD   0.8 mg at 04/23/20 1710    Lab Results: No results found for this or any previous visit (from the past 48 hour(s)).  Blood Alcohol level:  Lab Results  Component Value Date   ETH <10 01/22/2020   ETH <10 97/67/3419    Metabolic Disorder Labs: Lab Results  Component Value Date   HGBA1C 6.6 (H) 02/20/2020   MPG 142.72 02/20/2020   No results found for: PROLACTIN Lab Results  Component Value Date   CHOL 153 04/10/2015   TRIG 166 (H) 04/10/2015   HDL 50 04/10/2015   CHOLHDL 3.1 04/10/2015   VLDL 33 04/10/2015   LDLCALC 70 04/10/2015     Physical Findings: AIMS:  , ,  ,  ,    CIWA:    COWS:     Musculoskeletal: Strength & Muscle Tone: within normal limits Gait & Station: normal Patient leans: N/A  Psychiatric Specialty Exam: Physical Exam Vitals and nursing note reviewed.  Constitutional:      Appearance: He is well-developed.  HENT:     Head: Normocephalic and atraumatic.  Eyes:     Conjunctiva/sclera: Conjunctivae normal.     Pupils: Pupils are equal, round, and reactive to light.  Cardiovascular:     Heart sounds: Normal heart sounds.  Pulmonary:     Effort: Pulmonary effort is normal.  Abdominal:     Palpations: Abdomen is soft.  Musculoskeletal:        General: Normal range of motion.     Cervical back: Normal range of motion.  Skin:    General: Skin is warm and dry.  Neurological:     General: No focal deficit present.     Mental Status: He is alert.  Psychiatric:        Mood and Affect: Mood normal.     Review of Systems  Constitutional: Negative.   HENT: Negative.   Eyes: Negative.   Respiratory: Negative.   Cardiovascular: Negative.   Gastrointestinal: Negative.   Musculoskeletal:  Negative.   Skin: Negative.   Neurological: Negative.   Psychiatric/Behavioral: Negative.     Blood pressure 128/69, pulse 64, temperature 98.4 F (36.9 C), temperature source Oral, resp. rate 18, height 5\' 8"  (1.727 m), weight 104.3 kg, SpO2 98 %.Body mass index is 34.96 kg/m.  General Appearance: Casual  Eye Contact:  Good  Speech:  Clear and Coherent  Volume:  Normal  Mood:  Euthymic  Affect:  Congruent  Thought Process:  Goal Directed  Orientation:  Full (Time, Place, and Person)  Thought Content:  Logical  Suicidal Thoughts:  No  Homicidal Thoughts:  No  Memory:  Immediate;   Fair Recent;   Fair Remote;   Fair  Judgement:  Fair  Insight:  Fair  Psychomotor Activity:  Normal  Concentration:  Concentration: Fair  Recall:  [employer] of Knowledge:  Fair  Language:  Fair  Akathisia:   Negative  Handed:  Right  AIMS (if indicated):     Assets:  Desire for Improvement  ADL's:  Intact  Cognition:  WNL  Sleep:  Number of Hours: 6.75     Treatment Plan Summary: Daily contact with patient to assess and evaluate symptoms and progress in treatment, Medication management and Plan No change to medication management.  Supportive counseling.  Review of overall treatment plan with treatment team.  Continue to look into placement  Alethia Berthold, MD 04/24/2020, 11:56 AM

## 2020-04-24 NOTE — Progress Notes (Signed)
Recreation Therapy Notes   Date: 04/24/2020  Time: 9:30 am   Location: Craft room   Behavioral response: Appropriate  Intervention Topic: Stress Management   Discussion/Intervention:  Group content on today was focused on stress. The group defined stress and way to cope with stress. Participants expressed how they know when they are stresses out. Individuals described the different ways they have to cope with stress. The group stated reasons why it is important to cope with stress. Patient explained what good stress is and some examples. The group participated in the intervention "Stress Management". Individuals were separated into two group and answered questions related to stress.  Clinical Observations/Feedback: Patient came to group and defined stress as staying on something to long. He expressed that when he is stressed he becomes anxious. Participant stated that not discharging is making him stressed and disappointed. Individual was social with peers and staff while participating in the intervention.  Tashari Schoenfelder LRT/CTRS         Radiah Lubinski 04/24/2020 12:09 PM

## 2020-04-25 DIAGNOSIS — F250 Schizoaffective disorder, bipolar type: Secondary | ICD-10-CM | POA: Diagnosis not present

## 2020-04-25 NOTE — Plan of Care (Signed)
Patient stayed in bed until lunch time. Did not eat breakfast. He is currently out of bed and visible in the milieu. Reports that he was feeling tired and needed more sleep. Patient had lunch and received AM medications. Lunchtime medications held as approved by MD.

## 2020-04-25 NOTE — Progress Notes (Signed)
[facility] MD Progress Note  04/25/2020 11:50 AM Alexander Beard  MRN:  [id number] Subjective: Follow-up for this patient with dementia developmental disability schizoaffective disorder.  Patient seen chart reviewed.  Plan reviewed with nursing.  Patient felt sleepy today and slept late.  When he did [place] up he wanted to talk about a lot of various things he was remorseful about but was very appropriate about it.  He tells me last night he was upset but today he is feeling more calm.  Remains anxious about the obvious problem with not having a place to live.  No new physical issues Principal Problem: Dementia ([place]) Diagnosis: Principal Problem:   Dementia ([place]) Active Problems:   Schizoaffective disorder, bipolar type ([place])   Active autistic disorder   Diabetes ([place])   Hypertension   Prostate hypertrophy   Intellectual disability   At risk for elopement  Total Time spent with patient: 30 minutes  Past Psychiatric History: Past history of longstanding developmental disability  Past Medical History:  Past Medical History:  Diagnosis Date  . Anxiety   . Anxiety disorder due to known physiological condition    HOSPITALIZED 10/18  . Arthritis   . Autistic disorder, residual state   . COPD (chronic obstructive pulmonary disease) ([place])   . Depression   . Developmental disorder   . Diabetes mellitus without complication ([place])   . Dyslipidemia   . Esophageal reflux   . HOH (hard of hearing)    MILDLY  . Hypertension   . Obesity   . Overactive bladder   . Palpitations    ANXIETY  . Schizophrenia, schizoaffective ([place])   . Sleep apnea   . Tremors of nervous system    HANDS DUE TO MEDICATIONS  . Urinary incontinence     Past Surgical History:  Procedure Laterality Date  . CATARACT EXTRACTION W/PHACO Left 11/14/2017   Procedure: CATARACT EXTRACTION PHACO AND INTRAOCULAR LENS PLACEMENT (IOC);  Surgeon: Birder Robson, MD;  Location: [facility] ORS;  Service: Ophthalmology;  Laterality:  Left;  [place] 00:42 AP% 14.6 CDE 6.12 Fluid pack lot # [id number]  . COLONOSCOPY  01/28/2005  . COLONOSCOPY WITH PROPOFOL N/A 05/09/2016   Procedure: COLONOSCOPY WITH PROPOFOL;  Surgeon: Manya Silvas, MD;  Location: [facility] ENDOSCOPY;  Service: Endoscopy;  Laterality: N/A;  . ESOPHAGOGASTRODUODENOSCOPY N/A 05/09/2016   Procedure: ESOPHAGOGASTRODUODENOSCOPY (EGD);  Surgeon: Manya Silvas, MD;  Location: [facility] ENDOSCOPY;  Service: Endoscopy;  Laterality: N/A;  . FRACTURE SURGERY     ORIF SHOULDER  . TONSILLECTOMY     Family History:  Family History  Problem Relation Age of Onset  . Hypertension Mother   . Stroke Father   . Heart Problems Father    Family Psychiatric  History: See previous Social History:  Social History   Substance and Sexual Activity  Alcohol Use No  . Alcohol/week: 0.0 standard drinks     Social History   Substance and Sexual Activity  Drug Use No    Social History   Socioeconomic History  . Marital status: Single    Spouse name: Not on file  . Number of children: Not on file  . Years of education: Not on file  . Highest education level: Not on file  Occupational History  . Not on file  Tobacco Use  . Smoking status: Never Smoker  . Smokeless tobacco: Never Used  Vaping Use  . Vaping Use: Never used  Substance and Sexual Activity  . Alcohol use: No    Alcohol/week:  0.0 standard drinks  . Drug use: No  . Sexual activity: Never  Other Topics Concern  . Not on file  Social History Narrative   The patient never finished high school but did get his GED. 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 He is currently in disability and [name] is his legal guardian. He has been living in group homes for many years.      No pending legal charges   Social Determinants of Health   Financial Resource Strain:   . Difficulty of Paying Living Expenses: Not on file  Food Insecurity:   .  Worried About Charity fundraiser in the Last Year: Not on file  . Ran Out of Food in the Last Year: Not on file  Transportation Needs:   . Lack of Transportation (Medical): Not on file  . Lack of Transportation (Non-Medical): Not on file  Physical Activity:   . Days of Exercise per Week: Not on file  . Minutes of Exercise per Session: Not on file  Stress:   . Feeling of Stress : Not on file  Social Connections:   . Frequency of Communication with Friends and Family: Not on file  . Frequency of Social Gatherings with Friends and Family: Not on file  . Attends Religious Services: Not on file  . Active Member of Clubs or Organizations: Not on file  . Attends Archivist Meetings: Not on file  . Marital Status: Not on file   Additional Social History:                         Sleep: Fair  Appetite:  Fair  Current Medications: Current Facility-Administered Medications  Medication Dose Route Frequency Provider Last Rate Last Admin  . acetaminophen (TYLENOL) tablet 650 mg  650 mg Oral Q6H PRN [name], MD      . alum & mag hydroxide-simeth (MAALOX/MYLANTA) 200-200-20 MG/5ML suspension 30 mL  30 mL Oral Q4H PRN [name], MD      . aspirin EC tablet 81 mg  81 mg Oral Daily Nanie Dunkleberger, [name], MD   81 mg at 04/25/20 0853  . clonazePAM (KLONOPIN) tablet 1 mg  1 mg Oral QID Williard Keller, [name], MD   1 mg at 04/25/20 0854  . divalproex (DEPAKOTE ER) 24 hr tablet 1,500 mg  1,500 mg Oral QHS Leighton Brickley T, MD   1,500 mg at 04/24/20 2129  . gabapentin (NEURONTIN) capsule 100 mg  100 mg Oral TID Radek Carnero, [name], MD   100 mg at 04/25/20 0854  . hydrOXYzine (ATARAX/VISTARIL) tablet 10 mg  10 mg Oral TID PRN [name], MD   10 mg at 04/20/20 2128  . influenza vac split quadrivalent PF (FLUARIX) injection 0.5 mL  0.5 mL Intramuscular Tomorrow-1000 Pailynn Vahey T, MD      . lamoTRIgine (LAMICTAL) tablet 100 mg  100 mg Oral QHS Vance Belcourt T, MD   100 mg at  04/24/20 2130  . linagliptin (TRADJENTA) tablet 5 mg  5 mg Oral Daily Leslie Jester, [name], MD   5 mg at 04/25/20 0854  . loperamide (IMODIUM) capsule 2 mg  2 mg Oral Q4H PRN [name], MD   2 mg at 04/10/20 0917  . loratadine (CLARITIN) tablet 10 mg  10 mg Oral Daily Lillieann Pavlich, [name], MD   10 mg at 04/25/20 0855  .  LORazepam (ATIVAN) tablet 2 mg  2 mg Oral Q6H PRN Asmaa Tirpak, [name], MD   2 mg at 04/07/20 1240  . magnesium hydroxide (MILK OF MAGNESIA) suspension 30 mL  30 mL Oral Daily PRN [name], MD   30 mL at 03/23/20 1344  . metFORMIN (GLUCOPHAGE) tablet 850 mg  850 mg Oral BID WC Ladiamond Gallina, [name], MD   850 mg at 04/25/20 0856  . metoprolol succinate (TOPROL-XL) 24 hr tablet 50 mg  50 mg Oral Daily Jaedah Lords, [name], MD   50 mg at 04/25/20 0854  . ondansetron (ZOFRAN) tablet 4 mg  4 mg Oral Q8H PRN Kooper Chriswell, [name], MD   4 mg at 04/17/20 0344  . oxybutynin (DITROPAN) tablet 10 mg  10 mg Oral BID Onur Mori, [name], MD   10 mg at 04/25/20 0855  . pantoprazole (PROTONIX) EC tablet 40 mg  40 mg Oral Daily Josue Kass, [name], MD   40 mg at 04/25/20 0853  . prazosin (MINIPRESS) capsule 1 mg  1 mg Oral QHS Taeshawn Helfman T, MD   1 mg at 04/23/20 2148  . QUEtiapine (SEROQUEL) tablet 100 mg  100 mg Oral QID Shyra Emile, [name], MD   100 mg at 04/25/20 0854  . QUEtiapine (SEROQUEL) tablet 400 mg  400 mg Oral QHS [name], NP   400 mg at 04/24/20 2129  . sertraline (ZOLOFT) tablet 50 mg  50 mg Oral BID Pedro Oldenburg, [name], MD   50 mg at 04/25/20 0854  . simethicone (MYLICON) chewable tablet 80 mg  80 mg Oral TID Brantley Wiley, [name], MD   80 mg at 04/25/20 0855  . simvastatin (ZOCOR) tablet 20 mg  20 mg Oral q1800 Jerrine Urschel, [name], MD   20 mg at 04/24/20 1852  . tamsulosin (FLOMAX) capsule 0.8 mg  0.8 mg Oral QPC supper Ishani Goldwasser, [name], MD   0.8 mg at 04/24/20 0722    Lab Results: No results found for this or any previous visit (from the past 48 hour(s)).  Blood Alcohol level:  Lab Results  Component Value  Date   ETH <10 01/22/2020   ETH <10 57/50/5183    Metabolic Disorder Labs: Lab Results  Component Value Date   HGBA1C 6.6 (H) 02/20/2020   MPG 142.72 02/20/2020   No results found for: PROLACTIN Lab Results  Component Value Date   CHOL 153 04/10/2015   TRIG 166 (H) 04/10/2015   HDL 50 04/10/2015   CHOLHDL 3.1 04/10/2015   VLDL 33 04/10/2015   LDLCALC 70 04/10/2015    Physical Findings: AIMS:  , ,  ,  ,    CIWA:    COWS:     Musculoskeletal: Strength & Muscle Tone: within normal limits Gait & Station: normal Patient leans: N/A  Psychiatric Specialty Exam: Physical Exam Vitals and nursing note reviewed.  Constitutional:      Appearance: He is well-developed.  HENT:     Head: Normocephalic and atraumatic.  Eyes:     Conjunctiva/sclera: Conjunctivae normal.     Pupils: Pupils are equal, round, and reactive to light.  Cardiovascular:     Heart sounds: Normal heart sounds.  Pulmonary:     Effort: Pulmonary effort is normal.  Abdominal:     Palpations: Abdomen is soft.  Musculoskeletal:        General: Normal range of motion.     Cervical back: Normal range of motion.  Skin:    General: Skin is warm and dry.  Neurological:     General: No focal deficit present.     Mental Status: He is alert.  Psychiatric:        Mood and Affect: Mood normal.        Thought Content: Thought content normal.     Review of Systems  Constitutional: Negative.   HENT: Negative.   Eyes: Negative.   Respiratory: Negative.   Cardiovascular: Negative.   Gastrointestinal: Negative.   Musculoskeletal: Negative.   Skin: Negative.   Neurological: Negative.     Blood pressure 125/72, pulse (!) 56, temperature 98.3 F (36.8 C), temperature source Oral, resp. rate 18, height 5\' 8"  (1.727 m), weight 104.3 kg, SpO2 100 %.Body mass index is 34.96 kg/m.  General Appearance: Casual  Eye Contact:  Good  Speech:  Clear and Coherent  Volume:  Normal  Mood:  Euthymic  Affect:  Congruent   Thought Process:  Goal Directed  Orientation:  Full (Time, Place, and Person)  Thought Content:  Logical  Suicidal Thoughts:  No  Homicidal Thoughts:  No  Memory:  Immediate;   Fair Recent;   Fair Remote;   Fair  Judgement:  Fair  Insight:  Fair  Psychomotor Activity:  Normal  Concentration:  Concentration: Fair  Recall:  [employer] of Knowledge:  Fair  Language:  Fair  Akathisia:  No  Handed:  Right  AIMS (if indicated):     Assets:  Desire for Improvement Housing Resilience  ADL's:  Intact  Cognition:  WNL  Sleep:  Number of Hours: 5     Treatment Plan Summary: Daily contact with patient to assess and evaluate symptoms and progress in treatment, Medication management and Plan No change to current treatment plan.  Supportive therapy completed.  Review of medicines and overall goals of treatment plan.  Alethia Berthold, MD 04/25/2020, 11:50 AM

## 2020-04-25 NOTE — [facility] Group Notes (Signed)
[facility] Group Notes: (Clinical Social Work)   04/25/2020      Type of Therapy:  Group Therapy   Participation Level:  Did Not Attend - was invited individually by Nurse/MHT and chose not to attend.   [name], LCSWA 04/25/2020  1:16 PM

## 2020-04-25 NOTE — Plan of Care (Signed)
  Problem: Education: Goal: Knowledge of [employer] [facility] Education information/materials will improve Outcome: Progressing Goal: Emotional status will improve Outcome: Progressing Goal: Mental status will improve Outcome: Progressing Goal: Verbalization of understanding the information provided will improve Outcome: Progressing   

## 2020-04-25 NOTE — Tx Team (Signed)
Interdisciplinary Treatment and Diagnostic Plan Update  04/25/2020 Time of Session: 10:20 AM  Alexander Beard MRN: [id number]  Principal Diagnosis: Dementia [facility])  Secondary Diagnoses: Principal Problem:   Dementia ([facility]) Active Problems:   Schizoaffective disorder, bipolar type ([place])   Active autistic disorder   Diabetes ([place])   Hypertension   Prostate hypertrophy   Intellectual disability   At risk for elopement   Current Medications:  Current Facility-Administered Medications  Medication Dose Route Frequency Provider Last Rate Last Admin  . acetaminophen (TYLENOL) tablet 650 mg  650 mg Oral Q6H PRN [name], MD      . alum & mag hydroxide-simeth (MAALOX/MYLANTA) 200-200-20 MG/5ML suspension 30 mL  30 mL Oral Q4H PRN [name], MD      . aspirin EC tablet 81 mg  81 mg Oral Daily [name], [name], MD   81 mg at 04/25/20 0853  . clonazePAM (KLONOPIN) tablet 1 mg  1 mg Oral QID [name], [name], MD   1 mg at 04/25/20 0854  . divalproex (DEPAKOTE ER) 24 hr tablet 1,500 mg  1,500 mg Oral QHS [name], [name], MD   1,500 mg at 04/24/20 2129  . gabapentin (NEURONTIN) capsule 100 mg  100 mg Oral TID [name], [name], MD   100 mg at 04/25/20 0854  . hydrOXYzine (ATARAX/VISTARIL) tablet 10 mg  10 mg Oral TID PRN [name], MD   10 mg at 04/20/20 2128  . influenza vac split quadrivalent PF (FLUARIX) injection 0.5 mL  0.5 mL Intramuscular Tomorrow-1000 [name], [name], MD      . lamoTRIgine (LAMICTAL) tablet 100 mg  100 mg Oral QHS [name], [name], MD   100 mg at 04/24/20 2130  . linagliptin (TRADJENTA) tablet 5 mg  5 mg Oral Daily [name], [name], MD   5 mg at 04/25/20 0854  . loperamide (IMODIUM) capsule 2 mg  2 mg Oral Q4H PRN [name], MD   2 mg at 04/10/20 0917  . loratadine (CLARITIN) tablet 10 mg  10 mg Oral Daily [name], [name], MD   10 mg at 04/25/20 0855  . LORazepam (ATIVAN) tablet 2 mg  2 mg Oral Q6H PRN [name], [name], MD   2 mg at 04/07/20 1240  .  magnesium hydroxide (MILK OF MAGNESIA) suspension 30 mL  30 mL Oral Daily PRN [name], MD   30 mL at 03/23/20 1344  . metFORMIN (GLUCOPHAGE) tablet 850 mg  850 mg Oral BID WC [name], [name], MD   850 mg at 04/25/20 0856  . metoprolol succinate (TOPROL-XL) 24 hr tablet 50 mg  50 mg Oral Daily [name], [name], MD   50 mg at 04/25/20 0854  . ondansetron (ZOFRAN) tablet 4 mg  4 mg Oral Q8H PRN [name], [name], MD   4 mg at 04/17/20 0344  . oxybutynin (DITROPAN) tablet 10 mg  10 mg Oral BID [name], [name], MD   10 mg at 04/25/20 0855  . pantoprazole (PROTONIX) EC tablet 40 mg  40 mg Oral Daily [name], [name], MD   40 mg at 04/25/20 0853  . prazosin (MINIPRESS) capsule 1 mg  1 mg Oral QHS [name], [name], MD   1 mg at 04/23/20 2148  . QUEtiapine (SEROQUEL) tablet 100 mg  100 mg Oral QID [name], [name], MD   100 mg at 04/25/20 0854  . QUEtiapine (SEROQUEL) tablet 400 mg  400 mg Oral QHS [name], NP  400 mg at 04/24/20 2129  . sertraline (ZOLOFT) tablet 50 mg  50 mg Oral BID [name], [name], MD   50 mg at 04/25/20 0854  . simethicone (MYLICON) chewable tablet 80 mg  80 mg Oral TID [name], [name], MD   80 mg at 04/25/20 0855  . simvastatin (ZOCOR) tablet 20 mg  20 mg Oral q1800 [name], [name], MD   20 mg at 04/24/20 1852  . tamsulosin (FLOMAX) capsule 0.8 mg  0.8 mg Oral QPC supper [name], [name], MD   0.8 mg at 04/24/20 4854   PTA Medications: Medications Prior to Admission  Medication Sig Dispense Refill Last Dose  . acetaminophen (TYLENOL) 650 MG CR tablet Take 650 mg by mouth every 12 (twelve) hours as needed for pain.     Marland Kitchen aspirin EC 81 MG tablet Take 81 mg by mouth daily.     . Calcium Carbonate-Vitamin D3 (CALCIUM 600-D) 600-400 MG-UNIT TABS Take 1 tablet by mouth 2 (two) times daily.     . Cholecalciferol (VITAMIN D-1000 MAX ST) 1000 UNITS tablet Take 1,000 Units by mouth daily.      Marland Kitchen docusate sodium (COLACE) 100 MG capsule Take 100 mg by mouth daily.      .  furosemide (LASIX) 20 MG tablet Take 20 mg by mouth daily.      Marland Kitchen gabapentin (NEURONTIN) 100 MG capsule Take 1 capsule (100 mg total) by mouth 3 (three) times daily. 90 capsule 10   . Insulin Detemir (LEVEMIR FLEXTOUCH) 100 UNIT/ML Pen Inject 15 Units into the skin daily at 10 pm.     . lamoTRIgine (LAMICTAL) 100 MG tablet Take 1 tablet (100 mg total) by mouth at bedtime. 30 tablet 10   . loratadine (CLARITIN) 10 MG tablet Take 1 tablet (10 mg total) by mouth daily. 30 tablet 5   . LORazepam (ATIVAN) 1 MG tablet Take 1 tablet (1 mg total) by mouth 3 (three) times daily. 90 tablet 5   . lurasidone (LATUDA) 40 MG TABS tablet Take 1 tablet (40 mg total) by mouth daily. with food 30 tablet 10   . metoprolol succinate (TOPROL-XL) 50 MG 24 hr tablet Take 50 mg by mouth daily.      . NON FORMULARY cpap device     . oxybutynin (DITROPAN) 5 MG tablet Take 5 mg by mouth daily.     . pantoprazole (PROTONIX) 40 MG tablet Take 40 mg by mouth daily.     . polyethylene glycol (MIRALAX / GLYCOLAX) packet Take 17 g by mouth daily.     . QUEtiapine (SEROQUEL) 100 MG tablet TAKE 1 TABLET  BY MOUTH THREE TIMES A DAY (Patient taking differently: Take 100 mg by mouth 3 (three) times daily. TAKE 1 TABLET  BY MOUTH THREE TIMES A DAY) 90 tablet 11   . QUEtiapine (SEROQUEL) 400 MG tablet Take 1 tablet (400 mg total) by mouth at bedtime. 30 tablet 10   . sertraline (ZOLOFT) 100 MG tablet Take 2 tablets (200 mg total) by mouth daily. (Patient taking differently: Take 200 mg by mouth daily. Take along with two 50 mg tablets (100 mg) for total 300 mg daily) 60 tablet 5   . sertraline (ZOLOFT) 50 MG tablet Take 2 tablets (100 mg total) by mouth daily. (Patient taking differently: Take 100 mg by mouth daily. Take along with two 100 mg tablets (200 mg) for total 300 mg daily) 60 tablet 5   . simethicone (MYLICON) 80 MG chewable tablet  Chew 80-160 mg by mouth 2 (two) times daily as needed for flatulence.     . simvastatin (ZOCOR) 20  MG tablet Take 20 mg by mouth at bedtime.      . sitaGLIPtin (JANUVIA) 100 MG tablet Take 100 mg by mouth daily.     . solifenacin (VESICARE) 5 MG tablet Take 5 mg by mouth daily.     . Starch (HEMORRHOIDAL RE) Place 1 application rectally 4 (four) times daily as needed (for itching).     . tamsulosin (FLOMAX) 0.4 MG CAPS capsule Take 0.4 mg by mouth daily.       Patient Stressors: Health problems Marital or family conflict Medication change or noncompliance Traumatic event  Patient Strengths: Motivation for treatment/growth Religious Affiliation  Treatment Modalities: Medication Management, Group therapy, Case management,  1 to 1 session with clinician, Psychoeducation, Recreational therapy.   Physician Treatment Plan for Primary Diagnosis: Dementia [facility]) Long Term Goal(s): Improvement in symptoms so as ready for discharge Improvement in symptoms so as ready for discharge   Short Term Goals: Ability to verbalize feelings will improve Ability to demonstrate self-control will improve Ability to maintain clinical measurements within normal limits will improve Compliance with prescribed medications will improve  Medication Management: Evaluate patient's response, side effects, and tolerance of medication regimen.  Therapeutic Interventions: 1 to 1 sessions, Unit Group sessions and Medication administration.  Evaluation of Outcomes: Progressing  Physician Treatment Plan for Secondary Diagnosis: Principal Problem:   Dementia ([place]) Active Problems:   Schizoaffective disorder, bipolar type ([place])   Active autistic disorder   Diabetes ([place])   Hypertension   Prostate hypertrophy   Intellectual disability   At risk for elopement  Long Term Goal(s): Improvement in symptoms so as ready for discharge Improvement in symptoms so as ready for discharge   Short Term Goals: Ability to verbalize feelings will improve Ability to demonstrate self-control will improve Ability to maintain  clinical measurements within normal limits will improve Compliance with prescribed medications will improve     Medication Management: Evaluate patient's response, side effects, and tolerance of medication regimen.  Therapeutic Interventions: 1 to 1 sessions, Unit Group sessions and Medication administration.  Evaluation of Outcomes: Progressing   RN Treatment Plan for Primary Diagnosis: Dementia ([place]) Long Term Goal(s): Knowledge of disease and therapeutic regimen to maintain health will improve  Short Term Goals: Ability to demonstrate self-control, Ability to participate in decision making will improve, Ability to verbalize feelings will improve, Ability to identify and develop effective coping behaviors will improve and Compliance with prescribed medications will improve  Medication Management: RN will administer medications as ordered by provider, will assess and evaluate patient's response and provide education to patient for prescribed medication. RN will report any adverse and/or side effects to prescribing provider.  Therapeutic Interventions: 1 on 1 counseling sessions, Psychoeducation, Medication administration, Evaluate responses to treatment, Monitor vital signs and CBGs as ordered, Perform/monitor CIWA, COWS, AIMS and Fall Risk screenings as ordered, Perform wound care treatments as ordered.  Evaluation of Outcomes: Progressing   LCSW Treatment Plan for Primary Diagnosis: Dementia ([place]) Long Term Goal(s): Safe transition to appropriate next level of care at discharge, Engage patient in therapeutic group addressing interpersonal concerns.  Short Term Goals: Engage patient in aftercare planning with referrals and resources, Increase social support, Increase ability to appropriately verbalize feelings, Increase emotional regulation and Increase skills for wellness and recovery  Therapeutic Interventions: Assess for all discharge needs, 1 to 1 time with Social worker, Explore  available  resources and support systems, Assess for adequacy in community support network, Educate family and significant other(s) on suicide prevention, Complete Psychosocial Assessment, Interpersonal group therapy.  Evaluation of Outcomes: Progressing   Progress in Treatment: Attending groups: Yes. Participating in groups: Yes. Taking medication as prescribed: Yes. Toleration medication: Yes. Family/Significant other contact made: Yes, individual(s) contacted:  [place], brother  Patient understands diagnosis: Yes. Discussing patient identified problems/goals with staff: Yes. Medical problems stabilized or resolved: Yes. Denies suicidal/homicidal ideation: No. Issues/concerns per patient self-inventory: No. Other: N/a   New problem(s) identified: No, Describe:  None   New Short Term/Long Term Goal(s): Elimination of symptoms of psychosis, medication management for mood stabilization; elimination of SI thoughts; development of comprehensive mental wellness/sobriety plan.Update 03/01/20:No changes at this time. 03/07/20: Update, no changes at this time Update 03/12/2020: No changes at this time.Update 03/17/2020:No changes at this time. Update 03/21/20:No changes at this time. Update 03/26/20: No changes at this time. Update 03/31/2020: No changes at this time.Update 04/04/20: No changes at this time Update 04/09/2020:  No changes at this time.  Update 04/14/2020:  No changes at this time.  Update 04/20/2020:  No changes at this time. Update: 04/25/2020: No changes at this time.   Patient Goals:  Patient stated he would like to go back to [place] and reconnect with his peer support. Patient also stated that he would like to increase his social support and help his brother.Update 03/07/20,no change Update 03/12/2020: No changes at this time. Update 03/17/2020:No changes at this time.Update 03/21/20: No changes at this time. Update 03/26/20: No changes at this time. Update 03/31/2020: No changes  at this time. Update9/25/21: No changes at this time.  Update 04/09/2020:  No changes at this time.  Update 04/14/2020:  No changes at this time.  Update 04/20/2020:  No changes at this time. 04/25/2020: Patient still indicates that he would like to go back to his old ways and life. Patient would like to get back into the community. 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   Discharge Plan or Barriers:  Patient has an interview with a group home on 03/09/20 at 2:00 PM. Update 03/12/2020: CSW continues to assist the patient in developing a housing plan.Update 03/17/2020:CSW has assisted patient in completing an interview with a group home, however, there has been no word on if the patient has been approved. Patient continues to struggle with his anxiety and nerves. Nurses report that he has increased in bed wetting. Psychiatrist and nurses believe that this may be related to his increased anxiety about finding a home.Update 03/21/20: Patient is still nervous and anxious about finding a new placement. Patient stated he could not stay here too much longer and wanted his life back. Update 03/26/20: CSW continues to look for placement for pt. Group Homes, [facility] are being considered. Update 03/31/2020: Patient continues to have interview with group homes, however, no success at this time in identifying a home that has accepted him.Update: 04/04/20: No changes at this time.  Update 04/09/2020:  CSW continues to look for placement for patient.  Update 04/14/2020:  No changes at this time.  CSW continues to look for placement.  Update 04/20/2020:  CSW continues to assist with placement needs. 04/25/2020: CSW continues to look for placement options.   Reason for Continuation of Hospitalization: Anxiety Depression Medication stabilization  Estimated Length of Stay: TBD  Attendees: Patient:  04/25/2020 10:29 AM  Physician: [name], MD  04/25/2020  10:29 AM   Nursing:  04/25/2020 10:29 AM  RN Care Manager: 04/25/2020 10:29 AM  Social Worker: [name], [place] 04/25/2020 10:29 AM  Recreational Therapist:  04/25/2020 10:29 AM  Other:  04/25/2020 10:29 AM  Other:  04/25/2020 10:29 AM  Other: 04/25/2020 10:29 AM    Scribe for Treatment Team: [name], [place] 04/25/2020 10:29 AM

## 2020-04-25 NOTE — Progress Notes (Signed)
Patient has been perseverating about leaving the hospital, getting a TV that will get his shows, and getting a peer support worker to take him to where he wants to go. Wants staff to call [facility] and the mayor's committee in order to get him out of here because he has been here too long

## 2020-04-26 DIAGNOSIS — F250 Schizoaffective disorder, bipolar type: Secondary | ICD-10-CM | POA: Diagnosis not present

## 2020-04-26 NOTE — [facility] Group Notes (Signed)
[facility] LCSW Group Therapy Note  Date/Time:  04/26/2020  1:20-2:30 PM   Type of Therapy and Topic:  Group Therapy:  Music and Mood  Participation Level:  Active   Description of Group: In this process group, members listened to a variety of genres of music and identified that different types of music evoke different responses.  Patients were encouraged to identify music that was soothing for them and music that was energizing for them.  Patients discussed how this knowledge can help with wellness and recovery in various ways including managing depression and anxiety as well as encouraging healthy sleep habits.    Therapeutic Goals: 1. Patients will explore the impact of different varieties of music on mood 2. Patients will verbalize the thoughts they have when listening to different types of music 3. Patients will identify music that is soothing to them as well as music that is energizing to them 4. Patients will discuss how to use this knowledge to assist in maintaining wellness and recovery 5. Patients will explore the use of music as a coping skill  Summary of Patient Progress: CSW took patient outside to listen to music, shoot the basketball, and play corn hole. Patient was able to request songs that made him happy. Patient expressed how being outside helps him and that is one of the things he really enjoys. Patients mood changed when he was able to interact outside with other patients.   Therapeutic Modalities: Solution Focused Brief Therapy Activity   [name], LCSWA 04/26/2020  2:29 PM

## 2020-04-26 NOTE — Progress Notes (Signed)
Calm, cooperative. No SI, HI or AVH

## 2020-04-26 NOTE — Plan of Care (Signed)
  Problem: Education: Goal: Knowledge of [facility] Education information/materials will improve Outcome: Progressing Goal: Emotional status will improve Outcome: Progressing Goal: Mental status will improve Outcome: Progressing Goal: Verbalization of understanding the information provided will improve Outcome: Progressing   

## 2020-04-26 NOTE — Progress Notes (Signed)
Patient stayed in the milieu, pleasant and cooperative. Went outside with peers and played different games. He did not have any major concern throughout this shift.

## 2020-04-26 NOTE — Progress Notes (Signed)
[facility] MD Progress Note  04/26/2020 1:42 PM Alexander Beard  MRN:  [id number] Subjective: Follow-up for this patient with chronic intellectual impairment and dementia.  Stable.  No new complaints.  Mood good.  Behavior calm. Principal Problem: Dementia ([place]) Diagnosis: Principal Problem:   Dementia ([place]) Active Problems:   Schizoaffective disorder, bipolar type ([place])   Active autistic disorder   Diabetes ([place])   Hypertension   Prostate hypertrophy   Intellectual disability   At risk for elopement  Total Time spent with patient: 30 minutes  Past Psychiatric History: Past history of longstanding impairment see many previous notes  Past Medical History:  Past Medical History:  Diagnosis Date  . Anxiety   . Anxiety disorder due to known physiological condition    HOSPITALIZED 10/18  . Arthritis   . Autistic disorder, residual state   . COPD (chronic obstructive pulmonary disease) ([place])   . Depression   . Developmental disorder   . Diabetes mellitus without complication ([place])   . Dyslipidemia   . Esophageal reflux   . HOH (hard of hearing)    MILDLY  . Hypertension   . Obesity   . Overactive bladder   . Palpitations    ANXIETY  . Schizophrenia, schizoaffective ([place])   . Sleep apnea   . Tremors of nervous system    HANDS DUE TO MEDICATIONS  . Urinary incontinence     Past Surgical History:  Procedure Laterality Date  . CATARACT EXTRACTION W/PHACO Left 11/14/2017   Procedure: CATARACT EXTRACTION PHACO AND INTRAOCULAR LENS PLACEMENT (IOC);  Surgeon: Birder Robson, MD;  Location: [facility] ORS;  Service: Ophthalmology;  Laterality: Left;  [place] 00:42 AP% 14.6 CDE 6.12 Fluid pack lot # [id number]  . COLONOSCOPY  01/28/2005  . COLONOSCOPY WITH PROPOFOL N/A 05/09/2016   Procedure: COLONOSCOPY WITH PROPOFOL;  Surgeon: Manya Silvas, MD;  Location: [facility] ENDOSCOPY;  Service: Endoscopy;  Laterality: N/A;  . ESOPHAGOGASTRODUODENOSCOPY N/A 05/09/2016   Procedure:  ESOPHAGOGASTRODUODENOSCOPY (EGD);  Surgeon: Manya Silvas, MD;  Location: [facility] ENDOSCOPY;  Service: Endoscopy;  Laterality: N/A;  . FRACTURE SURGERY     ORIF SHOULDER  . TONSILLECTOMY     Family History:  Family History  Problem Relation Age of Onset  . Hypertension Mother   . Stroke Father   . Heart Problems Father    Family Psychiatric  History: See previous Social History:  Social History   Substance and Sexual Activity  Alcohol Use No  . Alcohol/week: 0.0 standard drinks     Social History   Substance and Sexual Activity  Drug Use No    Social History   Socioeconomic History  . Marital status: Single    Spouse name: Not on file  . Number of children: Not on file  . Years of education: Not on file  . Highest education level: Not on file  Occupational History  . Not on file  Tobacco Use  . Smoking status: Never Smoker  . Smokeless tobacco: Never Used  Vaping Use  . Vaping Use: Never used  Substance and Sexual Activity  . Alcohol use: No    Alcohol/week: 0.0 standard drinks  . Drug use: No  . Sexual activity: Never  Other Topics Concern  . Not on file  Social History Narrative   The patient never finished high school but did get his GED. 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 He is currently in disability and  [name] is his legal guardian. He has been living in group homes for many years.      No pending legal charges   Social Determinants of Health   Financial Resource Strain:   . Difficulty of Paying Living Expenses: Not on file  Food Insecurity:   . Worried About Charity fundraiser in the Last Year: Not on file  . Ran Out of Food in the Last Year: Not on file  Transportation Needs:   . Lack of Transportation (Medical): Not on file  . Lack of Transportation (Non-Medical): Not on file  Physical Activity:   . Days of Exercise per Week: Not on file  . Minutes of Exercise per  Session: Not on file  Stress:   . Feeling of Stress : Not on file  Social Connections:   . Frequency of Communication with Friends and Family: Not on file  . Frequency of Social Gatherings with Friends and Family: Not on file  . Attends Religious Services: Not on file  . Active Member of Clubs or Organizations: Not on file  . Attends Archivist Meetings: Not on file  . Marital Status: Not on file   Additional Social History:                         Sleep: Fair  Appetite:  Fair  Current Medications: Current Facility-Administered Medications  Medication Dose Route Frequency Provider Last Rate Last Admin  . acetaminophen (TYLENOL) tablet 650 mg  650 mg Oral Q6H PRN [name], MD      . alum & mag hydroxide-simeth (MAALOX/MYLANTA) 200-200-20 MG/5ML suspension 30 mL  30 mL Oral Q4H PRN [name], MD      . aspirin EC tablet 81 mg  81 mg Oral Daily Maili Shutters, [name], MD   81 mg at 04/26/20 0810  . clonazePAM (KLONOPIN) tablet 1 mg  1 mg Oral QID Kollins Fenter, [name], MD   1 mg at 04/26/20 1235  . divalproex (DEPAKOTE ER) 24 hr tablet 1,500 mg  1,500 mg Oral QHS Swan Zayed T, MD   1,500 mg at 04/25/20 2113  . gabapentin (NEURONTIN) capsule 100 mg  100 mg Oral TID Charnetta Wulff T, MD   100 mg at 04/26/20 1235  . hydrOXYzine (ATARAX/VISTARIL) tablet 10 mg  10 mg Oral TID PRN [name], MD   10 mg at 04/20/20 2128  . influenza vac split quadrivalent PF (FLUARIX) injection 0.5 mL  0.5 mL Intramuscular Tomorrow-1000 Sugey Trevathan T, MD      . lamoTRIgine (LAMICTAL) tablet 100 mg  100 mg Oral QHS Floretta Petro T, MD   100 mg at 04/25/20 2113  . linagliptin (TRADJENTA) tablet 5 mg  5 mg Oral Daily Quadir Muns, [name], MD   5 mg at 04/26/20 0810  . loperamide (IMODIUM) capsule 2 mg  2 mg Oral Q4H PRN [name], MD   2 mg at 04/10/20 0917  . loratadine (CLARITIN) tablet 10 mg  10 mg Oral Daily Londan Coplen, [name], MD   10 mg at 04/26/20 0810  . LORazepam (ATIVAN)  tablet 2 mg  2 mg Oral Q6H PRN Janicia Monterrosa, [name], MD   2 mg at 04/07/20 1240  . magnesium hydroxide (MILK OF MAGNESIA) suspension 30 mL  30 mL Oral Daily PRN [name], MD   30 mL at 03/23/20 1344  . metFORMIN (GLUCOPHAGE) tablet 850 mg  850 mg Oral BID WC Adith Tejada  T, MD   850 mg at 04/26/20 0803  . metoprolol succinate (TOPROL-XL) 24 hr tablet 50 mg  50 mg Oral Daily Christne Platts, [name], MD   50 mg at 04/26/20 0809  . ondansetron (ZOFRAN) tablet 4 mg  4 mg Oral Q8H PRN Adhira Jamil, [name], MD   4 mg at 04/17/20 0344  . oxybutynin (DITROPAN) tablet 10 mg  10 mg Oral BID Janelli Welling, [name], MD   10 mg at 04/26/20 0804  . pantoprazole (PROTONIX) EC tablet 40 mg  40 mg Oral Daily Jatin Naumann, [name], MD   40 mg at 04/26/20 0810  . prazosin (MINIPRESS) capsule 1 mg  1 mg Oral QHS Scotty Weigelt T, MD   1 mg at 04/23/20 2148  . QUEtiapine (SEROQUEL) tablet 100 mg  100 mg Oral QID Nabria Nevin T, MD   100 mg at 04/26/20 1235  . QUEtiapine (SEROQUEL) tablet 400 mg  400 mg Oral QHS [name], NP   400 mg at 04/25/20 2113  . sertraline (ZOLOFT) tablet 50 mg  50 mg Oral BID Ostin Mathey, [name], MD   50 mg at 04/26/20 0810  . simethicone (MYLICON) chewable tablet 80 mg  80 mg Oral TID Damon Hargrove, [name], MD   80 mg at 04/26/20 1235  . simvastatin (ZOCOR) tablet 20 mg  20 mg Oral q1800 Amabel Stmarie, [name], MD   20 mg at 04/25/20 1723  . tamsulosin (FLOMAX) capsule 0.8 mg  0.8 mg Oral QPC supper Nedra Mcinnis T, MD   0.8 mg at 04/25/20 1725    Lab Results: No results found for this or any previous visit (from the past 48 hour(s)).  Blood Alcohol level:  Lab Results  Component Value Date   ETH <10 01/22/2020   ETH <10 14/78/2956    Metabolic Disorder Labs: Lab Results  Component Value Date   HGBA1C 6.6 (H) 02/20/2020   MPG 142.72 02/20/2020   No results found for: PROLACTIN Lab Results  Component Value Date   CHOL 153 04/10/2015   TRIG 166 (H) 04/10/2015   HDL 50 04/10/2015   CHOLHDL 3.1 04/10/2015    VLDL 33 04/10/2015   LDLCALC 70 04/10/2015    Physical Findings: AIMS:  , ,  ,  ,    CIWA:    COWS:     Musculoskeletal: Strength & Muscle Tone: within normal limits Gait & Station: normal Patient leans: N/A  Psychiatric Specialty Exam: Physical Exam Vitals and nursing note reviewed.  Constitutional:      Appearance: He is well-developed.  HENT:     Head: Normocephalic and atraumatic.  Eyes:     Conjunctiva/sclera: Conjunctivae normal.     Pupils: Pupils are equal, round, and reactive to light.  Cardiovascular:     Heart sounds: Normal heart sounds.  Pulmonary:     Effort: Pulmonary effort is normal.  Abdominal:     Palpations: Abdomen is soft.  Musculoskeletal:        General: Normal range of motion.     Cervical back: Normal range of motion.  Skin:    General: Skin is warm and dry.  Neurological:     General: No focal deficit present.     Mental Status: He is alert.  Psychiatric:        Mood and Affect: Mood normal.     Review of Systems  Constitutional: Negative.   HENT: Negative.   Eyes: Negative.   Respiratory: Negative.   Cardiovascular: Negative.   Gastrointestinal:  Negative.   Musculoskeletal: Negative.   Skin: Negative.   Neurological: Negative.   Psychiatric/Behavioral: Negative.     Blood pressure 130/83, pulse 63, temperature 97.9 F (36.6 C), temperature source Oral, resp. rate 16, height 5\' 8"  (1.727 m), weight 104.3 kg, SpO2 99 %.Body mass index is 34.96 kg/m.  General Appearance: Casual  Eye Contact:  Good  Speech:  Clear and Coherent  Volume:  Normal  Mood:  Euthymic  Affect:  Congruent  Thought Process:  Goal Directed  Orientation:  Full (Time, Place, and Person)  Thought Content:  Logical  Suicidal Thoughts:  No  Homicidal Thoughts:  No  Memory:  Immediate;   Fair Recent;   Fair Remote;   Fair  Judgement:  Fair  Insight:  Fair  Psychomotor Activity:  Normal  Concentration:  Concentration: Fair  Recall:  [employer] of  Knowledge:  Fair  Language:  Fair  Akathisia:  No  Handed:  Right  AIMS (if indicated):     Assets:  Desire for Improvement  ADL's:  Intact  Cognition:  WNL  Sleep:  Number of Hours: 5     Treatment Plan Summary: Plan No change to medication management.  Honestly appears to be doing very well at this point.  We just need to find some kind of placement for him.  Alethia Berthold, MD 04/26/2020, 1:42 PM

## 2020-04-26 NOTE — Plan of Care (Signed)
Patient is out of bed and active in the milieu. Cooperative and pleasant. Reports that he slept well. Patient ate breakfast and had AM medications. Has no concerns so far.

## 2020-04-27 DIAGNOSIS — F0391 Unspecified dementia with behavioral disturbance: Secondary | ICD-10-CM | POA: Diagnosis not present

## 2020-04-27 NOTE — Plan of Care (Signed)
  Problem: Education: Goal: Knowledge of [facility] Education information/materials will improve Outcome: Progressing Goal: Emotional status will improve Outcome: Progressing Goal: Mental status will improve Outcome: Progressing Goal: Verbalization of understanding the information provided will improve Outcome: Progressing   Problem: Activity: Goal: Interest or engagement in activities will improve Outcome: Progressing Goal: Sleeping patterns will improve Outcome: Progressing   

## 2020-04-27 NOTE — BHH Counselor (Addendum)
CSW contacted the following in an effort to obtain placement:  [facility], [place], [phone], [place], [phone], [place], [phone]. [place], [phone] CSW was asked to send FL2 and H&P to [phone] Attention [facility].  CSW was informed that all NOA are with the same managers and this is the main line.  [place]obin Chad and [place] #4, [phone] CSW left HIPAA compliant voicemail.  Tobin Chad and [place] #6, [phone] CSW left HIPAA compliant voicemail.  [place]obin Chad and [place] #8, [phone] CSW left HIPAA compliant voicemail.  Tobin Chad and [place] #3, [phone] CSW left HIPAA compliant voicemail.  Tobin Chad and [place] #7, [phone] CSW left HIPAA compliant voicemail.  [facility]obin Chad and [place] #5, [phone] CSW left HIPAA compliant voicemail.  [place] Chad and [place] #2, [phone] CSW spoke with resident who reports that he went to ask staff and staff told him to "hang the phone up".  Line was disconnected.  [place], [place], [phone] Line rang without the option to leave a voicemail message.  Dorris Singh, [phone] CSW unable to leave HIPAA compliant voicemail, voicemail box is full.   Salley Scarlet, [place], [phone] CSW left HIPAA compliant voicemail.  Randal Buba, [place], [phone] No male beds available.   [facility] Ellison, [phone] Two openings, will call back with daughter who runs the homes as well to further discuss.  Alla Feeling, [place], [phone] Declined to discuss the gentlemen for the beds.  Lavada Mesi, [phone] Number did not appear to be in working order.   Marinell Blight, [phone] Male bed will become available in 2 weeks. Fax [phone]   Constance Holster [phone] No male beds available.    Chrystie Nose, [place], [phone] CSW left HIPAA compliant  voicemail.  Dellis Anes, [phone] Beds available, asked for information to be emailed to [email or web address].  If accepted patient will need to go to a [facility] program.     Terri Skains, [phone] Asked for Fl2 to be sent to [phone].  Monico Blitz, [place], [phone] No male beds available.   CSW received confirmation that the fax was successful.  Assunta Curtis, MSW, LCSW 04/27/2020 12:00 PM

## 2020-04-27 NOTE — Progress Notes (Signed)
Recreation Therapy Notes   Date: 04/27/2020  Time: 9:30 am   Location: Craft room   Behavioral response: Appropriate  Intervention Topic: Creative expressions   Discussion/Intervention:  Group content on today was focused on creative expressions. The group defined creative expressions and ways they use creative expressions. Individual identified other positive ways creative expressions can be used and why it is important to express yourself. Patients participated in the intervention "expressive thoughts", where they had a chance to creatively express themselves. Clinical Observations/Feedback: Patient came to group and defined creative expressions as drawing and coloring. He stated that he is writing a letter to th [place] to inspire others. Individual was social with peers and staff while participating in the intervention.  Devin Ganaway LRT/CTRS             Shantele Reller 04/27/2020 12:39 PM

## 2020-04-27 NOTE — Plan of Care (Signed)
Patient is appropriate with staff & peers.Participate in the group activities.No panic attack episode verbalized today.Denies SI,HI and AVH.Compliant with medications.Appetite and energy level good.Support and encouragement given.

## 2020-04-27 NOTE — Progress Notes (Signed)
Patient has been calm and cooperative. Denies SI, HI and AVH

## 2020-04-27 NOTE — Progress Notes (Signed)
[facility] MD Progress Note  04/27/2020 2:50 PM Alexander Beard  MRN:  [id number]   Subjective:  Ms. Vanleer seen one-on-one in empty group room. He had no behavioral issues over the weekend or today. He continues to ruminate on desire to get back to seeing his brother, and volunteering int he community. He denies suicidal ideations, homicidal ideations, visual hallucinations, or auditory hallucinations. He states sometimes he has panic attacks, but simply lays down and does deep breathing exercises until they pass. He notes that he is upset that no one will accept him to a group home based on past behavior of running away. He feels he has been able to learn how to cope with his panic attacks well enough to not do this in the future.   Principal Problem: Dementia ([place]) Diagnosis: Principal Problem:   Dementia ([place]) Active Problems:   Schizoaffective disorder, bipolar type ([place])   Active autistic disorder   Diabetes ([place])   Hypertension   Prostate hypertrophy   Intellectual disability   At risk for elopement  Total Time spent with patient: 1 hour  Past Psychiatric History: Past history of longstanding impairment see many previous notes  Past Medical History:  Past Medical History:  Diagnosis Date   Anxiety    Anxiety disorder due to known physiological condition    HOSPITALIZED 10/18   Arthritis    Autistic disorder, residual state    COPD (chronic obstructive pulmonary disease) ([place])    Depression    Developmental disorder    Diabetes mellitus without complication (HCC)    Dyslipidemia    Esophageal reflux    HOH (hard of hearing)    MILDLY   Hypertension    Obesity    Overactive bladder    Palpitations    ANXIETY   Schizophrenia, schizoaffective (HCC)    Sleep apnea    Tremors of nervous system    HANDS DUE TO MEDICATIONS   Urinary incontinence     Past Surgical History:  Procedure Laterality Date   CATARACT EXTRACTION W/PHACO Left 11/14/2017   Procedure:  CATARACT EXTRACTION PHACO AND INTRAOCULAR LENS PLACEMENT (IOC);  Surgeon: Birder Robson, MD;  Location: [facility] ORS;  Service: Ophthalmology;  Laterality: Left;  [place] 00:42 AP% 14.6 CDE 6.12 Fluid pack lot # [id number]   COLONOSCOPY  01/28/2005   COLONOSCOPY WITH PROPOFOL N/A 05/09/2016   Procedure: COLONOSCOPY WITH PROPOFOL;  Surgeon: Manya Silvas, MD;  Location: [facility] ENDOSCOPY;  Service: Endoscopy;  Laterality: N/A;   ESOPHAGOGASTRODUODENOSCOPY N/A 05/09/2016   Procedure: ESOPHAGOGASTRODUODENOSCOPY (EGD);  Surgeon: Manya Silvas, MD;  Location: [facility] ENDOSCOPY;  Service: Endoscopy;  Laterality: N/A;   FRACTURE SURGERY     ORIF SHOULDER   TONSILLECTOMY     Family History:  Family History  Problem Relation Age of Onset   Hypertension Mother    Stroke Father    Heart Problems Father    Family Psychiatric  History: see previous Social History:  Social History   Substance and Sexual Activity  Alcohol Use No   Alcohol/week: 0.0 standard drinks     Social History   Substance and Sexual Activity  Drug Use No    Social History   Socioeconomic History   Marital status: Single    Spouse name: Not on file   Number of children: Not on file   Years of education: Not on file   Highest education level: Not on file  Occupational History   Not on file  Tobacco Use   Smoking  status: Never Smoker   Smokeless tobacco: Never Used  Vaping Use   Vaping Use: Never used  Substance and Sexual Activity   Alcohol use: No    Alcohol/week: 0.0 standard drinks   Drug use: No   Sexual activity: Never  Other Topics Concern   Not on file  Social History Narrative   The patient never finished high school but did get his GED. 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 He is currently in disability and [name] is his legal guardian. He has been living in group homes for many years.      No pending legal  charges   Social Determinants of Health   Financial Resource Strain:    Difficulty of Paying Living Expenses: Not on file  Food Insecurity:    Worried About [place] in the Last Year: Not on file   Ran Out of Food in the Last Year: Not on file  Transportation Needs:    Lack of Transportation (Medical): Not on file   Lack of Transportation (Non-Medical): Not on file  Physical Activity:    Days of Exercise per Week: Not on file   Minutes of Exercise per Session: Not on file  Stress:    Feeling of Stress : Not on file  Social Connections:    Frequency of Communication with Friends and Family: Not on file   Frequency of Social Gatherings with Friends and Family: Not on file   Attends Religious Services: Not on file   Active Member of Clubs or Organizations: Not on file   Attends Archivist Meetings: Not on file   Marital Status: Not on file   Additional Social History:                         Sleep: Good  Appetite:  Good  Current Medications: Current Facility-Administered Medications  Medication Dose Route Frequency Provider Last Rate Last Admin   acetaminophen (TYLENOL) tablet 650 mg  650 mg Oral Q6H PRN [name], MD       alum & mag hydroxide-simeth (MAALOX/MYLANTA) 200-200-20 MG/5ML suspension 30 mL  30 mL Oral Q4H PRN [name], MD       aspirin EC tablet 81 mg  81 mg Oral Daily [name], [name], MD   81 mg at 04/27/20 0851   clonazePAM (KLONOPIN) tablet 1 mg  1 mg Oral QID [name], [name], MD   1 mg at 04/27/20 1217   divalproex (DEPAKOTE ER) 24 hr tablet 1,500 mg  1,500 mg Oral QHS [name], [name], MD   1,500 mg at 04/26/20 2120   gabapentin (NEURONTIN) capsule 100 mg  100 mg Oral TID [name], [name], MD   100 mg at 04/27/20 1217   hydrOXYzine (ATARAX/VISTARIL) tablet 10 mg  10 mg Oral TID PRN [name], MD   10 mg at 04/20/20 2128   influenza vac split quadrivalent PF (FLUARIX) injection 0.5 mL  0.5  mL Intramuscular Tomorrow-1000 [name], [name], MD       lamoTRIgine (LAMICTAL) tablet 100 mg  100 mg Oral QHS [name], [name], MD   100 mg at 04/26/20 2121   linagliptin (TRADJENTA) tablet 5 mg  5 mg Oral Daily [name], [name], MD   5 mg at 04/27/20 0851   loperamide (IMODIUM) capsule 2 mg  2 mg Oral Q4H PRN [name],  MD   2 mg at 04/10/20 0917   loratadine (CLARITIN) tablet 10 mg  10 mg Oral Daily [name], [name], MD   10 mg at 04/27/20 0851   LORazepam (ATIVAN) tablet 2 mg  2 mg Oral Q6H PRN [name], [name], MD   2 mg at 04/07/20 1240   magnesium hydroxide (MILK OF MAGNESIA) suspension 30 mL  30 mL Oral Daily PRN [name], MD   30 mL at 03/23/20 1344   metFORMIN (GLUCOPHAGE) tablet 850 mg  850 mg Oral BID WC [name], [name], MD   850 mg at 04/27/20 0750   metoprolol succinate (TOPROL-XL) 24 hr tablet 50 mg  50 mg Oral Daily [name], [name], MD   50 mg at 04/27/20 0851   ondansetron (ZOFRAN) tablet 4 mg  4 mg Oral Q8H PRN [name], [name], MD   4 mg at 04/17/20 0344   oxybutynin (DITROPAN) tablet 10 mg  10 mg Oral BID [name], [name], MD   10 mg at 04/27/20 0850   pantoprazole (PROTONIX) EC tablet 40 mg  40 mg Oral Daily [name], [name], MD   40 mg at 04/27/20 0751   prazosin (MINIPRESS) capsule 1 mg  1 mg Oral QHS [name], [name], MD   1 mg at 04/23/20 2148   QUEtiapine (SEROQUEL) tablet 100 mg  100 mg Oral QID [name], [name], MD   100 mg at 04/27/20 1217   QUEtiapine (SEROQUEL) tablet 400 mg  400 mg Oral QHS [name], NP   400 mg at 04/26/20 2120   sertraline (ZOLOFT) tablet 50 mg  50 mg Oral BID [name], [name], MD   50 mg at 04/27/20 0851   simethicone (MYLICON) chewable tablet 80 mg  80 mg Oral TID [name], [name], MD   80 mg at 04/27/20 1217   simvastatin (ZOCOR) tablet 20 mg  20 mg Oral q1800 [name], [name], MD   20 mg at 04/26/20 1722   tamsulosin (FLOMAX) capsule 0.8 mg  0.8 mg Oral QPC supper [name], [name], MD   0.8 mg at 04/26/20 1723     Lab Results: No results found for this or any previous visit (from the past 48 hour(s)).  Blood Alcohol level:  Lab Results  Component Value Date   ETH <10 01/22/2020   ETH <10 56/38/7564    Metabolic Disorder Labs: Lab Results  Component Value Date   HGBA1C 6.6 (H) 02/20/2020   MPG 142.72 02/20/2020   No results found for: PROLACTIN Lab Results  Component Value Date   CHOL 153 04/10/2015   TRIG 166 (H) 04/10/2015   HDL 50 04/10/2015   CHOLHDL 3.1 04/10/2015   VLDL 33 04/10/2015   LDLCALC 70 04/10/2015    Physical Findings: AIMS:  , ,  ,  ,    CIWA:    COWS:     Musculoskeletal: Strength & Muscle Tone: within normal limits Gait & Station: normal Patient leans: N/A  Psychiatric Specialty Exam: Physical Exam Vitals and nursing note reviewed.  Constitutional:      Appearance: Normal appearance.  HENT:     Head: Normocephalic and atraumatic.     Right Ear: External ear normal.     Left Ear: External ear normal.     Nose: Nose normal.     Mouth/Throat:     Mouth: Mucous membranes are moist.     Pharynx: Oropharynx is clear.  Eyes:     Extraocular Movements: Extraocular movements  intact.     Conjunctiva/sclera: Conjunctivae normal.     Pupils: Pupils are equal, round, and reactive to light.  Cardiovascular:     Rate and Rhythm: Normal rate.     Pulses: Normal pulses.  Pulmonary:     Effort: Pulmonary effort is normal.     Breath sounds: Normal breath sounds.  Abdominal:     General: Abdomen is flat.     Palpations: Abdomen is soft.  Musculoskeletal:        General: No swelling. Normal range of motion.     Cervical back: Normal range of motion and neck supple.  Skin:    General: Skin is warm and dry.  Neurological:     General: No focal deficit present.     Mental Status: He is alert. Mental status is at baseline.  Psychiatric:        Mood and Affect: Mood normal.        Behavior: Behavior normal.        Thought Content: Thought content normal.         Judgment: Judgment normal.     Review of Systems  Constitutional: Negative for appetite change and fatigue.  HENT: Negative for rhinorrhea and sore throat.   Eyes: Negative for photophobia and visual disturbance.  Respiratory: Negative for cough and shortness of breath.   Cardiovascular: Negative for chest pain and palpitations.  Gastrointestinal: Negative for constipation, diarrhea, nausea and vomiting.  Endocrine: Negative for cold intolerance and heat intolerance.  Genitourinary: Negative for difficulty urinating and dysuria.  Musculoskeletal: Negative for back pain and neck stiffness.  Skin: Negative for rash and wound.  Allergic/Immunologic: Negative for food allergies and immunocompromised state.  Neurological: Negative for dizziness and headaches.  Hematological: Negative for adenopathy. Does not bruise/bleed easily.  Psychiatric/Behavioral: Negative for hallucinations and suicidal ideas.    Blood pressure 119/68, pulse 67, temperature 98.4 F (36.9 C), temperature source Oral, resp. rate 16, height 5\' 8"  (1.727 m), weight 104.3 kg, SpO2 97 %.Body mass index is 34.96 kg/m.  General Appearance: Casual  Eye Contact:  Good  Speech:  Clear and Coherent  Volume:  Normal  Mood:  Hopeless  Affect:  Congruent  Thought Process:  Coherent  Orientation:  Full (Time, Place, and Person)  Thought Content:  Logical  Suicidal Thoughts:  No  Homicidal Thoughts:  No  Memory:  Immediate;   Fair Recent;   Fair Remote;   Fair  Judgement:  Fair  Insight:  Fair  Psychomotor Activity:  Normal  Concentration:  Concentration: Fair and Attention Span: Fair  Recall:  [employer] of Knowledge:  Fair  Language:  Fair  Akathisia:  Negative  Handed:  Right  AIMS (if indicated):     Assets:  Communication Skills Desire for Improvement Resilience Social Support  ADL's:  Intact  Cognition:  WNL  Sleep:  Number of Hours: 5.75     Treatment Plan Summary: Daily contact with patient to  assess and evaluate symptoms and progress in treatment and Medication management. Plan: no changes to current medications. Patient is at baseline, but requires placement.   Salley Scarlet, MD 04/27/2020, 2:50 PM

## 2020-04-27 NOTE — B[facility] Group Notes (Signed)
LCSW Group Therapy Note   04/27/2020 3:07 PM  Type of Therapy and Topic:  Group Therapy:  Overcoming Obstacles   Participation Level:  Active   Description of Group:    In this group patients will be encouraged to explore what they see as obstacles to their own wellness and recovery. They will be guided to discuss their thoughts, feelings, and behaviors related to these obstacles. The group will process together ways to cope with barriers, with attention given to specific choices patients can make. Each patient will be challenged to identify changes they are motivated to make in order to overcome their obstacles. This group will be process-oriented, with patients participating in exploration of their own experiences as well as giving and receiving support and challenge from other group members.   Therapeutic Goals: 1. Patient will identify personal and current obstacles as they relate to admission. 2. Patient will identify barriers that currently interfere with their wellness or overcoming obstacles.  3. Patient will identify feelings, thought process and behaviors related to these barriers. 4. Patient will identify two changes they are willing to make to overcome these obstacles:      Summary of Patient Progress Patient was present in group. Patient struggled with remaining on topic. Patient discussed his triggers to set him off.  He stated several times that he wanted to be let off the unit and then allowed to return to allow for him to have a "tast of my freedom".     Therapeutic Modalities:   Cognitive Behavioral Therapy Solution Focused Therapy Motivational Interviewing Relapse Prevention Therapy  Assunta Curtis, MSW, LCSW 04/27/2020 3:07 PM

## 2020-04-28 DIAGNOSIS — F0391 Unspecified dementia with behavioral disturbance: Secondary | ICD-10-CM | POA: Diagnosis not present

## 2020-04-28 NOTE — Plan of Care (Signed)
  Problem: Education: Goal: Knowledge of [place] [facility] Education information/materials will improve Outcome: Progressing Goal: Emotional status will improve Outcome: Progressing Goal: Mental status will improve Outcome: Progressing Goal: Verbalization of understanding the information provided will improve Outcome: Progressing   

## 2020-04-28 NOTE — Progress Notes (Signed)
D- Patient alert and oriented. Affect/mood is calm and cooperative. Pt denies SI, HI, AVH, and pain.Pt attended groups and enjoyed them. Pt rested more in his room though out the day with a decreased appetite.   A- Scheduled medications administered to patient, per MD orders. Support and encouragement provided.  Routine safety checks conducted every 15 minutes.  Patient informed to notify staff with problems or concerns.  R- No adverse drug reactions noted. Patient contracts for safety at this time. Patient compliant with medications and treatment plan. Patient receptive, calm, and cooperative. Patient interacts well with others on the unit.  Patient remains safe at this time.  Collier Bullock RN

## 2020-04-28 NOTE — B[facility] Counselor (Addendum)
LCSW Group Therapy Note  04/28/2020 2:08 PM  Type of Therapy/Topic:  Group Therapy:  Feelings about Diagnosis  Participation Level:  Minimal   Description of Group:   This group will allow patients to explore their thoughts and feelings about diagnoses they have received. Patients will be guided to explore their level of understanding and acceptance of these diagnoses. Facilitator will encourage patients to process their thoughts and feelings about the reactions of others to their diagnosis and will guide patients in identifying ways to discuss their diagnosis with significant others in their lives. This group will be process-oriented, with patients participating in exploration of their own experiences, giving and receiving support, and processing challenge from other group members.   Therapeutic Goals: 1. Patient will demonstrate understanding of diagnosis as evidenced by identifying two or more symptoms of the disorder 2. Patient will be able to express two feelings regarding the diagnosis 3. Patient will demonstrate their ability to communicate their needs through discussion and/or role play  Summary of Patient Progress: Patient participated but was often off topic and focused on coloring and discharge.     Therapeutic Modalities:   Cognitive Behavioral Therapy Brief Therapy Feelings Identification   Alexander Beard R. [name], MSW, [place], [place] 04/28/2020 2:08 PM

## 2020-04-28 NOTE — Progress Notes (Signed)
Recreation Therapy Notes   Date: 04/28/2020  Time: 9:30 am   Location: Craft room   Behavioral response: Appropriate  Intervention Topic: Coping Skills   Discussion/Intervention:  Group content on today was focused on coping skills. The group defined what coping skills are and when they normally use coping skills. Individuals described how they normally cope with thing and the coping skills they normally use. Patients expressed why it is important to cope with things and how not coping with things can affect you. The group participated in the intervention "My coping box" and made coping boxes while adding coping skills they could use in the future to the box. Clinical Observations/Feedback: Patient came to group and defined coping skills as drawing and artwork which helps with his mental illness. He explained that he lies down, and practices deep breathing to relax and calm down. Participant expressed that coping skills are important to help with his panic attacks and focus on how to solve problems.Individual was social with peers and staff while participating in the intervention.  Pallavi Clifton LRT/CTRS       Amijah Timothy 04/28/2020 11:52 AM

## 2020-04-28 NOTE — B[facility] Counselor (Signed)
CSW did receive call that patient was ACCEPTED to a home, however, as conversation continued the home became confused and stated that they needed to call this CSW back tomorrow.    At this time it is unclear if pt can go to this home and [facility], [phone].  Assunta Curtis, MSW, LCSW 04/28/2020 3:39 PM

## 2020-04-28 NOTE — Progress Notes (Signed)
[facility] MD Progress Note  04/28/2020 3:17 PM DOV DILL  MRN:  [id number]   Subjective:  Ms. Tennyson seen one-on-one in his bedroom.  He had some nausea this morning, and received Zofran. He states the nausea has passed, and he was able to eat lunch. He had no behavioral issues overnight or today. He continues to ruminate on desire to volunteer in the community. He denies suicidal ideations, homicidal ideations, visual hallucinations, or auditory hallucinations.  He notes that he is upset that no one will accept him to a group home based on past behavior of running away. He feels he has been able to learn how to cope with his panic attacks well enough to not do this in the future. He also shows provider his drawings he has done in the craft room.  Principal Problem: Dementia ([place]) Diagnosis: Principal Problem:   Dementia ([place]) Active Problems:   Schizoaffective disorder, bipolar type ([place])   Active autistic disorder   Diabetes ([place])   Hypertension   Prostate hypertrophy   Intellectual disability   At risk for elopement  Total Time spent with patient: 30 minutes  Past Psychiatric History: Past history of longstanding impairment see many previous notes  Past Medical History:  Past Medical History:  Diagnosis Date  . Anxiety   . Anxiety disorder due to known physiological condition    HOSPITALIZED 10/18  . Arthritis   . Autistic disorder, residual state   . COPD (chronic obstructive pulmonary disease) ([place])   . Depression   . Developmental disorder   . Diabetes mellitus without complication ([place])   . Dyslipidemia   . Esophageal reflux   . HOH (hard of hearing)    MILDLY  . Hypertension   . Obesity   . Overactive bladder   . Palpitations    ANXIETY  . Schizophrenia, schizoaffective ([place])   . Sleep apnea   . Tremors of nervous system    HANDS DUE TO MEDICATIONS  . Urinary incontinence     Past Surgical History:  Procedure Laterality Date  . CATARACT EXTRACTION W/PHACO  Left 11/14/2017   Procedure: CATARACT EXTRACTION PHACO AND INTRAOCULAR LENS PLACEMENT (IOC);  Surgeon: Birder Robson, MD;  Location: [facility] ORS;  Service: Ophthalmology;  Laterality: Left;  [place] 00:42 AP% 14.6 CDE 6.12 Fluid pack lot # [id number]  . COLONOSCOPY  01/28/2005  . COLONOSCOPY WITH PROPOFOL N/A 05/09/2016   Procedure: COLONOSCOPY WITH PROPOFOL;  Surgeon: Manya Silvas, MD;  Location: [facility] ENDOSCOPY;  Service: Endoscopy;  Laterality: N/A;  . ESOPHAGOGASTRODUODENOSCOPY N/A 05/09/2016   Procedure: ESOPHAGOGASTRODUODENOSCOPY (EGD);  Surgeon: Manya Silvas, MD;  Location: [facility] ENDOSCOPY;  Service: Endoscopy;  Laterality: N/A;  . FRACTURE SURGERY     ORIF SHOULDER  . TONSILLECTOMY     Family History:  Family History  Problem Relation Age of Onset  . Hypertension Mother   . Stroke Father   . Heart Problems Father    Family Psychiatric  History: see previous Social History:  Social History   Substance and Sexual Activity  Alcohol Use No  . Alcohol/week: 0.0 standard drinks     Social History   Substance and Sexual Activity  Drug Use No    Social History   Socioeconomic History  . Marital status: Single    Spouse name: Not on file  . Number of children: Not on file  . Years of education: Not on file  . Highest education level: Not on file  Occupational History  . Not on  file  Tobacco Use  . Smoking status: Never Smoker  . Smokeless tobacco: Never Used  Vaping Use  . Vaping Use: Never used  Substance and Sexual Activity  . Alcohol use: No    Alcohol/week: 0.0 standard drinks  . Drug use: No  . Sexual activity: Never  Other Topics Concern  . Not on file  Social History Narrative   The patient never finished high school but did get his GED. 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 He is currently in disability and [name] is his legal guardian. He has been living in group homes for many  years.      No pending legal charges   Social Determinants of Health   Financial Resource Strain:   . Difficulty of Paying Living Expenses: Not on file  Food Insecurity:   . Worried About Charity fundraiser in the Last Year: Not on file  . Ran Out of Food in the Last Year: Not on file  Transportation Needs:   . Lack of Transportation (Medical): Not on file  . Lack of Transportation (Non-Medical): Not on file  Physical Activity:   . Days of Exercise per Week: Not on file  . Minutes of Exercise per Session: Not on file  Stress:   . Feeling of Stress : Not on file  Social Connections:   . Frequency of Communication with Friends and Family: Not on file  . Frequency of Social Gatherings with Friends and Family: Not on file  . Attends Religious Services: Not on file  . Active Member of Clubs or Organizations: Not on file  . Attends Archivist Meetings: Not on file  . Marital Status: Not on file   Additional Social History:                         Sleep: Good  Appetite:  Good  Current Medications: Current Facility-Administered Medications  Medication Dose Route Frequency Provider Last Rate Last Admin  . acetaminophen (TYLENOL) tablet 650 mg  650 mg Oral Q6H PRN [name], MD      . alum & mag hydroxide-simeth (MAALOX/MYLANTA) 200-200-20 MG/5ML suspension 30 mL  30 mL Oral Q4H PRN [name], MD      . aspirin EC tablet 81 mg  81 mg Oral Daily [name], [name], MD   81 mg at 04/28/20 0807  . clonazePAM (KLONOPIN) tablet 1 mg  1 mg Oral QID [name], [name], MD   1 mg at 04/28/20 1150  . divalproex (DEPAKOTE ER) 24 hr tablet 1,500 mg  1,500 mg Oral QHS [name], [name], MD   1,500 mg at 04/27/20 2119  . gabapentin (NEURONTIN) capsule 100 mg  100 mg Oral TID [name], [name], MD   100 mg at 04/28/20 1150  . hydrOXYzine (ATARAX/VISTARIL) tablet 10 mg  10 mg Oral TID PRN [name], MD   10 mg at 04/20/20 2128  . influenza vac split quadrivalent PF  (FLUARIX) injection 0.5 mL  0.5 mL Intramuscular Tomorrow-1000 [name], [name], MD      . lamoTRIgine (LAMICTAL) tablet 100 mg  100 mg Oral QHS [name], [name], MD   100 mg at 04/27/20 2119  . linagliptin (TRADJENTA) tablet 5 mg  5 mg Oral Daily [name], [name], MD   5 mg at 04/28/20 0807  . loperamide (IMODIUM) capsule 2 mg  2 mg Oral Q4H PRN [name], MD   2 mg at 04/10/20 1610  . loratadine (CLARITIN) tablet 10 mg  10 mg Oral Daily [name], [name], MD   10 mg at 04/28/20 9604  . LORazepam (ATIVAN) tablet 2 mg  2 mg Oral Q6H PRN [name], [name], MD   2 mg at 04/07/20 1240  . magnesium hydroxide (MILK OF MAGNESIA) suspension 30 mL  30 mL Oral Daily PRN [name], MD   30 mL at 03/23/20 1344  . metFORMIN (GLUCOPHAGE) tablet 850 mg  850 mg Oral BID WC [name], [name], MD   850 mg at 04/28/20 0810  . metoprolol succinate (TOPROL-XL) 24 hr tablet 50 mg  50 mg Oral Daily [name], [name], MD   50 mg at 04/28/20 0808  . ondansetron (ZOFRAN) tablet 4 mg  4 mg Oral Q8H PRN [name], [name], MD   4 mg at 04/28/20 0119  . oxybutynin (DITROPAN) tablet 10 mg  10 mg Oral BID [name], [name], MD   10 mg at 04/28/20 0811  . pantoprazole (PROTONIX) EC tablet 40 mg  40 mg Oral Daily [name], [name], MD   40 mg at 04/28/20 5409  . prazosin (MINIPRESS) capsule 1 mg  1 mg Oral QHS [name], [name], MD   1 mg at 04/23/20 2148  . QUEtiapine (SEROQUEL) tablet 100 mg  100 mg Oral QID [name], [name], MD   100 mg at 04/28/20 1150  . QUEtiapine (SEROQUEL) tablet 400 mg  400 mg Oral QHS [name], NP   400 mg at 04/27/20 2118  . sertraline (ZOLOFT) tablet 50 mg  50 mg Oral BID [name], [name], MD   50 mg at 04/28/20 0807  . simethicone (MYLICON) chewable tablet 80 mg  80 mg Oral TID [name], [name], MD   80 mg at 04/28/20 1150  . simvastatin (ZOCOR) tablet 20 mg  20 mg Oral q1800 [name], [name], MD   20 mg at 04/27/20 1641  . tamsulosin (FLOMAX) capsule 0.8 mg  0.8 mg Oral QPC supper [name], [name], MD    0.8 mg at 04/27/20 1641    Lab Results: No results found for this or any previous visit (from the past 48 hour(s)).  Blood Alcohol level:  Lab Results  Component Value Date   ETH <10 01/22/2020   ETH <10 81/19/1478    Metabolic Disorder Labs: Lab Results  Component Value Date   HGBA1C 6.6 (H) 02/20/2020   MPG 142.72 02/20/2020   No results found for: PROLACTIN Lab Results  Component Value Date   CHOL 153 04/10/2015   TRIG 166 (H) 04/10/2015   HDL 50 04/10/2015   CHOLHDL 3.1 04/10/2015   VLDL 33 04/10/2015   LDLCALC 70 04/10/2015    Physical Findings: AIMS: Facial and Oral Movements Muscles of Facial Expression: None, normal Lips and Perioral Area: None, normal Jaw: None, normal Tongue: None, normal,Extremity Movements Upper (arms, wrists, hands, fingers): None, normal Lower (legs, knees, ankles, toes): None, normal, Trunk Movements Neck, shoulders, hips: None, normal, Overall Severity Severity of abnormal movements (highest score from questions above): None, normal Incapacitation due to abnormal movements: None, normal Patient's awareness of abnormal movements (rate only patient's report): No Awareness, Dental Status Current problems with teeth and/or dentures?: No Does patient usually wear dentures?: No  CIWA:    COWS:     Musculoskeletal: Strength & Muscle Tone: within normal limits Gait & Station: normal Patient leans: N/A  Psychiatric Specialty Exam: Physical Exam Vitals and nursing note reviewed.  Constitutional:      Appearance: Normal appearance.  HENT:     Head: Normocephalic and atraumatic.     Right Ear: External ear normal.     Left Ear: External ear normal.     Nose: Nose normal.     Mouth/Throat:     Mouth: Mucous membranes are moist.     Pharynx: Oropharynx is clear.  Eyes:     Extraocular Movements: Extraocular movements intact.     Conjunctiva/sclera: Conjunctivae normal.     Pupils: Pupils are equal, round, and reactive to light.   Cardiovascular:     Rate and Rhythm: Normal rate.     Pulses: Normal pulses.  Pulmonary:     Effort: Pulmonary effort is normal.     Breath sounds: Normal breath sounds.  Abdominal:     General: Abdomen is flat.     Palpations: Abdomen is soft.  Musculoskeletal:        General: No swelling. Normal range of motion.     Cervical back: Normal range of motion and neck supple.  Skin:    General: Skin is warm and dry.  Neurological:     General: No focal deficit present.     Mental Status: He is alert. Mental status is at baseline.  Psychiatric:        Mood and Affect: Mood normal.        Behavior: Behavior normal.        Thought Content: Thought content normal.        Judgment: Judgment normal.     Review of Systems  Constitutional: Negative for appetite change and fatigue.  HENT: Negative for rhinorrhea and sore throat.   Eyes: Negative for photophobia and visual disturbance.  Respiratory: Negative for cough and shortness of breath.   Cardiovascular: Negative for chest pain and palpitations.  Gastrointestinal: Negative for constipation, diarrhea, nausea and vomiting.  Endocrine: Negative for cold intolerance and heat intolerance.  Genitourinary: Negative for difficulty urinating and dysuria.  Musculoskeletal: Negative for back pain and neck stiffness.  Skin: Negative for rash and wound.  Allergic/Immunologic: Negative for food allergies and immunocompromised state.  Neurological: Negative for dizziness and headaches.  Hematological: Negative for adenopathy. Does not bruise/bleed easily.  Psychiatric/Behavioral: Negative for hallucinations and suicidal ideas.    Blood pressure 113/81, pulse 75, temperature 98.4 F (36.9 C), temperature source Oral, resp. rate 16, height 5\' 8"  (1.727 m), weight 104.3 kg, SpO2 97 %.Body mass index is 34.96 kg/m.  General Appearance: Casual  Eye Contact:  Good  Speech:  Clear and Coherent  Volume:  Normal  Mood:  Hopeless  Affect:  Congruent   Thought Process:  Coherent  Orientation:  Full (Time, Place, and Person)  Thought Content:  Logical  Suicidal Thoughts:  No  Homicidal Thoughts:  No  Memory:  Immediate;   Fair Recent;   Fair Remote;   Fair  Judgement:  Fair  Insight:  Fair  Psychomotor Activity:  Normal  Concentration:  Concentration: Fair and Attention Span: Fair  Recall:  [employer] of Knowledge:  Fair  Language:  Fair  Akathisia:  Negative  Handed:  Right  AIMS (if indicated):     Assets:  Communication Skills Desire for Improvement Resilience Social Support  ADL's:  Intact  Cognition:  WNL  Sleep:  Number of Hours: 3.5     Treatment Plan Summary: Daily contact with patient to assess and evaluate symptoms  and progress in treatment and Medication management. Plan: no changes to current medications. Patient is at baseline, but requires placement.   Salley Scarlet, MD 04/28/2020, 3:17 PM

## 2020-04-28 NOTE — Plan of Care (Signed)
Pt was educated on care plan and verbalizes understanding. Pt was encouraged to attend groups. Collier Bullock RN Problem: Education: Goal: Knowledge of  General Education information/materials will improve Outcome: Progressing Goal: Emotional status will improve Outcome: Progressing Goal: Mental status will improve Outcome: Progressing Goal: Verbalization of understanding the information provided will improve Outcome: Progressing   Problem: Activity: Goal: Interest or engagement in activities will improve Outcome: Progressing Goal: Sleeping patterns will improve Outcome: Progressing   Problem: Coping: Goal: Ability to verbalize frustrations and anger appropriately will improve Outcome: Progressing Goal: Ability to demonstrate self-control will improve Outcome: Progressing   Problem: Health Behavior/Discharge Planning: Goal: Identification of resources available to assist in meeting health care needs will improve Outcome: Progressing Goal: Compliance with treatment plan for underlying cause of condition will improve Outcome: Progressing   Problem: Physical Regulation: Goal: Ability to maintain clinical measurements within normal limits will improve Outcome: Progressing   Problem: Safety: Goal: Periods of time without injury will increase Outcome: Progressing   Problem: Education: Goal: Ability to make informed decisions regarding treatment will improve Outcome: Progressing   Problem: Coping: Goal: Coping ability will improve Outcome: Progressing   Problem: Health Behavior/Discharge Planning: Goal: Identification of resources available to assist in meeting health care needs will improve Outcome: Progressing   Problem: Medication: Goal: Compliance with prescribed medication regimen will improve Outcome: Progressing   Problem: Self-Concept: Goal: Ability to disclose and discuss suicidal ideas will improve Outcome: Progressing Goal: Will verbalize positive  feelings about self Outcome: Progressing   Problem: Coping: Goal: Coping ability will improve Outcome: Progressing Goal: Will verbalize feelings Outcome: Progressing   Problem: Education: Goal: Utilization of techniques to improve thought processes will improve Outcome: Progressing Goal: Knowledge of the prescribed therapeutic regimen will improve Outcome: Progressing   Problem: Activity: Goal: Interest or engagement in leisure activities will improve Outcome: Progressing Goal: Imbalance in normal sleep/wake cycle will improve Outcome: Progressing   Problem: Coping: Goal: Coping ability will improve Outcome: Progressing Goal: Will verbalize feelings Outcome: Progressing   Problem: Health Behavior/Discharge Planning: Goal: Ability to make decisions will improve Outcome: Progressing Goal: Compliance with therapeutic regimen will improve Outcome: Progressing   Problem: Role Relationship: Goal: Will demonstrate positive changes in social behaviors and relationships Outcome: Progressing   Problem: Safety: Goal: Ability to disclose and discuss suicidal ideas will improve Outcome: Progressing Goal: Ability to identify and utilize support systems that promote safety will improve Outcome: Progressing   Problem: Self-Concept: Goal: Will verbalize positive feelings about self Outcome: Progressing Goal: Level of anxiety will decrease Outcome: Progressing   Problem: Coping: Goal: Ability to identify and develop effective coping behavior will improve Outcome: Progressing   Problem: Education: Goal: Ability to state activities that reduce stress will improve Outcome: Progressing   Problem: Coping: Goal: Ability to identify and develop effective coping behavior will improve Outcome: Progressing   Problem: Self-Concept: Goal: Ability to identify factors that promote anxiety will improve Outcome: Progressing Goal: Level of anxiety will decrease Outcome:  Progressing Goal: Ability to modify response to factors that promote anxiety will improve Outcome: Progressing

## 2020-04-28 NOTE — Progress Notes (Signed)
Patient complaining of nausea and gas. He said that the other patients have been making comments about his gas and it embarrasses him. He says that the pregnant patient told him his gas would harm her unborn baby and he was very concerned. Medicated patient for nausea per prn order. Encouraged patient to discuss his gas with his physician

## 2020-04-29 DIAGNOSIS — F0391 Unspecified dementia with behavioral disturbance: Secondary | ICD-10-CM | POA: Diagnosis not present

## 2020-04-29 NOTE — Progress Notes (Signed)
Patient slept through the night without incident. Safe on the unit with 15 minute safety checks.       Cleo Butler-Nicholson, LPN

## 2020-04-29 NOTE — [facility] Group Notes (Signed)
LCSW Group Therapy Note  04/29/2020 2:02 PM  Type of Therapy/Topic:  Group Therapy:  Emotion Regulation  Participation Level:  Minimal   Description of Group:   The purpose of this group is to assist patients in learning to regulate negative emotions and experience positive emotions. Patients will be guided to discuss ways in which they have been vulnerable to their negative emotions. These vulnerabilities will be juxtaposed with experiences of positive emotions or situations, and patients will be challenged to use positive emotions to combat negative ones. Special emphasis will be placed on coping with negative emotions in conflict situations, and patients will process healthy conflict resolution skills.  Therapeutic Goals: 1. Patient will identify two positive emotions or experiences to reflect on in order to balance out negative emotions 2. Patient will label two or more emotions that they find the most difficult to experience 3. Patient will demonstrate positive conflict resolution skills through discussion and/or role plays  Summary of Patient Progress: Patient was present in group.  Patient engaged in discussion on his triggers and identified that he is triggered by "All the restrictions here. I don't think some are necessary."  Patient then stated that he would like to draw and color. Patient then fell asleep.    Therapeutic Modalities:   Cognitive Behavioral Therapy Feelings Identification Dialectical Behavioral Therapy  Assunta Curtis, MSW, LCSW 04/29/2020 2:02 PM

## 2020-04-29 NOTE — Plan of Care (Signed)
Pt has no complaints. Pt denies SI, HI and AVH. Pt was educated on care plan and verbalizes understanding. Pt was encouraged to attend groups. Collier Bullock RN Problem: Education: Goal: Knowledge of [place] [facility] Education information/materials will improve Outcome: Progressing Goal: Emotional status will improve Outcome: Progressing Goal: Mental status will improve Outcome: Progressing Goal: Verbalization of understanding the information provided will improve Outcome: Progressing   Problem: Activity: Goal: Interest or engagement in activities will improve Outcome: Progressing Goal: Sleeping patterns will improve Outcome: Progressing   Problem: Coping: Goal: Ability to verbalize frustrations and anger appropriately will improve Outcome: Progressing Goal: Ability to demonstrate self-control will improve Outcome: Progressing   Problem: Health Behavior/Discharge Planning: Goal: Identification of resources available to assist in meeting health care needs will improve Outcome: Progressing Goal: Compliance with treatment plan for underlying cause of condition will improve Outcome: Progressing   Problem: Physical Regulation: Goal: Ability to maintain clinical measurements within normal limits will improve Outcome: Progressing   Problem: Safety: Goal: Periods of time without injury will increase Outcome: Progressing   Problem: Education: Goal: Ability to make informed decisions regarding treatment will improve Outcome: Progressing   Problem: Coping: Goal: Coping ability will improve Outcome: Progressing   Problem: Health Behavior/Discharge Planning: Goal: Identification of resources available to assist in meeting health care needs will improve Outcome: Progressing   Problem: Medication: Goal: Compliance with prescribed medication regimen will improve Outcome: Progressing   Problem: Self-Concept: Goal: Ability to disclose and discuss suicidal ideas will  improve Outcome: Progressing Goal: Will verbalize positive feelings about self Outcome: Progressing   Problem: Coping: Goal: Coping ability will improve Outcome: Progressing Goal: Will verbalize feelings Outcome: Progressing   Problem: Education: Goal: Utilization of techniques to improve thought processes will improve Outcome: Progressing Goal: Knowledge of the prescribed therapeutic regimen will improve Outcome: Progressing   Problem: Activity: Goal: Interest or engagement in leisure activities will improve Outcome: Progressing Goal: Imbalance in normal sleep/wake cycle will improve Outcome: Progressing   Problem: Coping: Goal: Coping ability will improve Outcome: Progressing Goal: Will verbalize feelings Outcome: Progressing   Problem: Health Behavior/Discharge Planning: Goal: Ability to make decisions will improve Outcome: Progressing Goal: Compliance with therapeutic regimen will improve Outcome: Progressing   Problem: Role Relationship: Goal: Will demonstrate positive changes in social behaviors and relationships Outcome: Progressing   Problem: Safety: Goal: Ability to disclose and discuss suicidal ideas will improve Outcome: Progressing Goal: Ability to identify and utilize support systems that promote safety will improve Outcome: Progressing   Problem: Self-Concept: Goal: Will verbalize positive feelings about self Outcome: Progressing Goal: Level of anxiety will decrease Outcome: Progressing   Problem: Coping: Goal: Ability to identify and develop effective coping behavior will improve Outcome: Progressing   Problem: Education: Goal: Ability to state activities that reduce stress will improve Outcome: Progressing   Problem: Coping: Goal: Ability to identify and develop effective coping behavior will improve Outcome: Progressing   Problem: Self-Concept: Goal: Ability to identify factors that promote anxiety will improve Outcome:  Progressing Goal: Level of anxiety will decrease Outcome: Progressing Goal: Ability to modify response to factors that promote anxiety will improve Outcome: Progressing

## 2020-04-29 NOTE — Progress Notes (Signed)
[facility] MD Progress Note  04/29/2020 2:45 PM Alexander Beard  MRN:  [id number]   Subjective:  Alexander Beard seen one-on-one in empty group room.  He had no behavioral issues overnight or today. He continues to ruminate on desire to volunteer in the community. He denies suicidal ideations, homicidal ideations, visual hallucinations, or auditory hallucinations.  He notes that he is upset that no one will accept him to a group home based on past behavior of running away. He feels he has been able to learn how to cope with his panic attacks well enough to not do this in the future. He is hopeful to find a place in [place] if not [place] [facility].  Principal Problem: Dementia ([place]) Diagnosis: Principal Problem:   Dementia ([place]) Active Problems:   Schizoaffective disorder, bipolar type ([place])   Active autistic disorder   Diabetes ([place])   Hypertension   Prostate hypertrophy   Intellectual disability   At risk for elopement  Total Time spent with patient: 30 minutes  Past Beard History: Past history of longstanding impairment see many previous notes  Past Medical History:  Past Medical History:  Diagnosis Date  . Anxiety   . Anxiety disorder due to known physiological condition    HOSPITALIZED 10/18  . Arthritis   . Autistic disorder, residual state   . COPD (chronic obstructive pulmonary disease) ([place])   . Depression   . Developmental disorder   . Diabetes mellitus without complication ([place])   . Dyslipidemia   . Esophageal reflux   . HOH (hard of hearing)    MILDLY  . Hypertension   . Obesity   . Overactive bladder   . Palpitations    ANXIETY  . Schizophrenia, schizoaffective ([place])   . Sleep apnea   . Tremors of nervous system    HANDS DUE TO MEDICATIONS  . Urinary incontinence     Past Surgical History:  Procedure Laterality Date  . CATARACT EXTRACTION W/PHACO Left 11/14/2017   Procedure: CATARACT EXTRACTION PHACO AND INTRAOCULAR LENS PLACEMENT (IOC);  Surgeon: Birder Robson, MD;  Location: [facility] ORS;  Service: Ophthalmology;  Laterality: Left;  [place] 00:42 AP% 14.6 CDE 6.12 Fluid pack lot # [id number]  . COLONOSCOPY  01/28/2005  . COLONOSCOPY WITH PROPOFOL N/A 05/09/2016   Procedure: COLONOSCOPY WITH PROPOFOL;  Surgeon: Manya Silvas, MD;  Location: [facility] ENDOSCOPY;  Service: Endoscopy;  Laterality: N/A;  . ESOPHAGOGASTRODUODENOSCOPY N/A 05/09/2016   Procedure: ESOPHAGOGASTRODUODENOSCOPY (EGD);  Surgeon: Manya Silvas, MD;  Location: [facility] ENDOSCOPY;  Service: Endoscopy;  Laterality: N/A;  . FRACTURE SURGERY     ORIF SHOULDER  . TONSILLECTOMY     Family History:  Family History  Problem Relation Age of Onset  . Hypertension Mother   . Stroke Father   . Heart Problems Father    Family Beard  History: see previous Social History:  Social History   Substance and Sexual Activity  Alcohol Use No  . Alcohol/week: 0.0 standard drinks     Social History   Substance and Sexual Activity  Drug Use No    Social History   Socioeconomic History  . Marital status: Single    Spouse name: Not on file  . Number of children: Not on file  . Years of education: Not on file  . Highest education level: Not on file  Occupational History  . Not on file  Tobacco Use  . Smoking status: Never Smoker  . Smokeless tobacco: Never Used  Vaping Use  .  Vaping Use: Never used  Substance and Sexual Activity  . Alcohol use: No    Alcohol/week: 0.0 standard drinks  . Drug use: No  . Sexual activity: Never  Other Topics Concern  . Not on file  Social History Narrative   The patient never finished high school but did get his GED. 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 He is currently in disability and his brother Alexander Beard is his legal guardian. He has been living in group homes for many years.      No pending legal charges   Social Determinants of Health   Financial Resource Strain:   .  Difficulty of Paying Living Expenses: Not on file  Food Insecurity:   . Worried About Charity fundraiser in the Last Year: Not on file  . Ran Out of Food in the Last Year: Not on file  Transportation Needs:   . Lack of Transportation (Medical): Not on file  . Lack of Transportation (Non-Medical): Not on file  Physical Activity:   . Days of Exercise per Week: Not on file  . Minutes of Exercise per Session: Not on file  Stress:   . Feeling of Stress : Not on file  Social Connections:   . Frequency of Communication with Friends and Family: Not on file  . Frequency of Social Gatherings with Friends and Family: Not on file  . Attends Religious Services: Not on file  . Active Member of Clubs or Organizations: Not on file  . Attends Archivist Meetings: Not on file  . Marital Status: Not on file   Additional Social History:                         Sleep: Good  Appetite:  Good  Current Medications: Current Facility-Administered Medications  Medication Dose Route Frequency Provider Last Rate Last Admin  . acetaminophen (TYLENOL) tablet 650 mg  650 mg Oral Q6H PRN [name], MD      . alum & mag hydroxide-simeth (MAALOX/MYLANTA) 200-200-20 MG/5ML suspension 30 mL  30 mL Oral Q4H PRN [name], MD      . aspirin EC tablet 81 mg  81 mg Oral Daily [name], [name], MD   81 mg at 04/29/20 3154  . clonazePAM (KLONOPIN) tablet 1 mg  1 mg Oral QID [name], [name], MD   1 mg at 04/29/20 1321  . divalproex (DEPAKOTE ER) 24 hr tablet 1,500 mg  1,500 mg Oral QHS [name], [name], MD   1,500 mg at 04/28/20 2123  . gabapentin (NEURONTIN) capsule 100 mg  100 mg Oral TID [name], [name], MD   100 mg at 04/29/20 1321  . hydrOXYzine (ATARAX/VISTARIL) tablet 10 mg  10 mg Oral TID PRN [name], MD   10 mg at 04/20/20 2128  . influenza vac split quadrivalent PF (FLUARIX) injection 0.5 mL  0.5 mL Intramuscular Tomorrow-1000 [name], [name], MD      . lamoTRIgine  (LAMICTAL) tablet 100 mg  100 mg Oral QHS [name], [name], MD   100 mg at 04/28/20 2123  . linagliptin (TRADJENTA) tablet 5 mg  5 mg Oral Daily [name], [name], MD   5 mg at 04/29/20 0924  . loperamide (IMODIUM) capsule 2 mg  2 mg Oral Q4H PRN [name], MD   2 mg at 04/10/20 0917  . loratadine (CLARITIN) tablet 10  mg  10 mg Oral Daily [name], [name], MD   10 mg at 04/29/20 0925  . LORazepam (ATIVAN) tablet 2 mg  2 mg Oral Q6H PRN [name], [name], MD   2 mg at 04/07/20 1240  . magnesium hydroxide (MILK OF MAGNESIA) suspension 30 mL  30 mL Oral Daily PRN [name], MD   30 mL at 03/23/20 1344  . metFORMIN (GLUCOPHAGE) tablet 850 mg  850 mg Oral BID WC [name], [name], MD   850 mg at 04/29/20 0924  . metoprolol succinate (TOPROL-XL) 24 hr tablet 50 mg  50 mg Oral Daily [name], [name], MD   50 mg at 04/29/20 0924  . ondansetron (ZOFRAN) tablet 4 mg  4 mg Oral Q8H PRN [name], [name], MD   4 mg at 04/28/20 0119  . oxybutynin (DITROPAN) tablet 10 mg  10 mg Oral BID [name], [name], MD   10 mg at 04/29/20 0926  . pantoprazole (PROTONIX) EC tablet 40 mg  40 mg Oral Daily [name], [name], MD   40 mg at 04/29/20 0927  . prazosin (MINIPRESS) capsule 1 mg  1 mg Oral QHS [name], [name], MD   1 mg at 04/28/20 2123  . QUEtiapine (SEROQUEL) tablet 100 mg  100 mg Oral QID [name], [name], MD   100 mg at 04/29/20 1321  . QUEtiapine (SEROQUEL) tablet 400 mg  400 mg Oral QHS [name], NP   400 mg at 04/28/20 2123  . sertraline (ZOLOFT) tablet 50 mg  50 mg Oral BID [name], [name], MD   50 mg at 04/29/20 0926  . simethicone (MYLICON) chewable tablet 80 mg  80 mg Oral TID [name], [name], MD   80 mg at 04/29/20 1320  . simvastatin (ZOCOR) tablet 20 mg  20 mg Oral q1800 [name], [name], MD   20 mg at 04/28/20 1631  . tamsulosin (FLOMAX) capsule 0.8 mg  0.8 mg Oral QPC supper [name], [name], MD   0.8 mg at 04/28/20 1630    Lab Results: No results found for this or any previous visit (from the  past 48 hour(s)).  Blood Alcohol level:  Lab Results  Component Value Date   ETH <10 01/22/2020   ETH <10 24/26/8341    Metabolic Disorder Labs: Lab Results  Component Value Date   HGBA1C 6.6 (H) 02/20/2020   MPG 142.72 02/20/2020   No results found for: PROLACTIN Lab Results  Component Value Date   CHOL 153 04/10/2015   TRIG 166 (H) 04/10/2015   HDL 50 04/10/2015   CHOLHDL 3.1 04/10/2015   VLDL 33 04/10/2015   LDLCALC 70 04/10/2015    Physical Findings: AIMS: Facial and Oral Movements Muscles of Facial Expression: None, normal Lips and Perioral Area: None, normal Jaw: None, normal Tongue: None, normal,Extremity Movements Upper (arms, wrists, hands, fingers): None, normal Lower (legs, knees, ankles, toes): None, normal, Trunk Movements Neck, shoulders, hips: None, normal, Overall Severity Severity of abnormal movements (highest score from questions above): None, normal Incapacitation due to abnormal movements: None, normal Patient's awareness of abnormal movements (rate only patient's report): No Awareness, Dental Status Current problems with teeth and/or dentures?: No Does patient usually wear dentures?: No  CIWA:    COWS:     Musculoskeletal: Strength & Muscle Tone: within normal limits Gait & Station: normal Patient leans: N/A  Beard Specialty Exam: Physical Exam Vitals and nursing note reviewed.  Constitutional:      Appearance: Normal appearance.  HENT:     Head: Normocephalic and atraumatic.     Right Ear: External ear normal.     Left Ear: External ear normal.     Nose: Nose normal.     Mouth/Throat:     Mouth: Mucous membranes are moist.     Pharynx: Oropharynx is clear.  Eyes:     Extraocular Movements: Extraocular movements intact.     Conjunctiva/sclera: Conjunctivae normal.     Pupils: Pupils are equal, round, and reactive to light.  Cardiovascular:     Rate and Rhythm: Normal rate.     Pulses: Normal pulses.  Pulmonary:      Effort: Pulmonary effort is normal.     Breath sounds: Normal breath sounds.  Abdominal:     General: Abdomen is flat.     Palpations: Abdomen is soft.  Musculoskeletal:        General: No swelling. Normal range of motion.     Cervical back: Normal range of motion and neck supple.  Skin:    General: Skin is warm and dry.  Neurological:     General: No focal deficit present.     Mental Status: He is alert. Mental status is at baseline.  Beard:        Mood and Affect: Mood normal.        Behavior: Behavior normal.        Thought Content: Thought content normal.        Judgment: Judgment normal.     Review of Systems  Constitutional: Negative for appetite change and fatigue.  HENT: Negative for rhinorrhea and sore throat.   Eyes: Negative for photophobia and visual disturbance.  Respiratory: Negative for cough and shortness of breath.   Cardiovascular: Negative for chest pain and palpitations.  Gastrointestinal: Negative for constipation, diarrhea, nausea and vomiting.  Endocrine: Negative for cold intolerance and heat intolerance.  Genitourinary: Negative for difficulty urinating and dysuria.  Musculoskeletal: Negative for back pain and neck stiffness.  Skin: Negative for rash and wound.  Allergic/Immunologic: Negative for food allergies and immunocompromised state.  Neurological: Negative for dizziness and headaches.  Hematological: Negative for adenopathy. Does not bruise/bleed easily.  Beard/Behavioral: Negative for hallucinations and suicidal ideas.    Blood pressure 123/74, pulse (!) 52, temperature 97.9 F (36.6 C), temperature source Oral, resp. rate 17, height 5\' 8"  (1.727 m), weight 104.3 kg, SpO2 97 %.Body mass index is 34.96 kg/m.  General Appearance: Casual  Eye Contact:  Good  Speech:  Clear and Coherent  Volume:  Normal  Mood:  Hopeless  Affect:  Congruent  Thought Process:  Coherent  Orientation:  Full (Time, Place, and Person)  Thought Content:   Logical  Suicidal Thoughts:  No  Homicidal Thoughts:  No  Memory:  Immediate;   Fair Recent;   Fair Remote;   Fair  Judgement:  Fair  Insight:  Fair  Psychomotor Activity:  Normal  Concentration:  Concentration: Fair and Attention Span: Fair  Recall:  [employer] of Knowledge:  Fair  Language:  Fair  Akathisia:  Negative  Handed:  Right  AIMS (if indicated):     Assets:  Communication Skills Desire for Improvement Resilience Social Support  ADL's:  Intact  Cognition:  WNL  Sleep:  Number of Hours: 7.25     Treatment Plan Summary: Daily contact with patient to assess and evaluate symptoms and progress in treatment and Medication management. Plan: no changes to current medications. Patient is at baseline, but requires placement.  Salley Scarlet, MD 04/29/2020, 2:45 PM

## 2020-04-29 NOTE — Progress Notes (Signed)
Recreation Therapy Notes  Date: 04/29/2020  Time: 9:30 am   Location: Craft room   Behavioral response: Appropriate  Intervention Topic: Teamwork   Discussion/Intervention:  Group content on today was focused on teamwork. The group identified what teamwork is. Individuals described who is a part of their team. Patients expressed why they thought teamwork is important. The group stated reasons why they thought it was easier to work with a Dance movement psychotherapist team. Individuals discussed some positives and negatives of working with a team. Patients gave examples of past experiences they had while working with a team. The group participated in the intervention "What is That", where patients were given a chance to point out qualities, they look for in a teammate and were able to work in teams with each other. 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 He explained that if everyone does not understand the task it could mess up the team work. Participant stated that team work is important to get the job done right. Individual was social with peers and staff while participating in the intervention.  Anaih Brander LRT/CTRS         Jyaire Koudelka 04/29/2020 12:59 PM

## 2020-04-29 NOTE — Progress Notes (Signed)
D- Patient alert and oriented. Pt affect/mood is cooperative and calm. Pt denies SI, HI, AVH, and pain.  A- Scheduled medications administered to patient, per MD orders. Support and encouragement provided.  Routine safety checks conducted every 15 minutes.  Patient informed to notify staff with problems or concerns.  R- No adverse drug reactions noted. Patient contracts for safety at this time. Patient compliant with medications and treatment plan. Patient receptive, calm, and cooperative. Patient interacts well with others on the unit.  Patient remains safe at this time.  Collier Bullock RN

## 2020-04-29 NOTE — B[facility] Counselor (Signed)
CSW faxed FL2 to [facility], [phone] f [phone].  CSW received confirmation the fax was successful.  CSW has also utilized the HUB and referred the patient for SNF's and Memory Care Units, at this time, several have declined.    Assunta Curtis, MSW, LCSW 04/29/2020 3:23 PM

## 2020-04-30 DIAGNOSIS — F0391 Unspecified dementia with behavioral disturbance: Secondary | ICD-10-CM | POA: Diagnosis not present

## 2020-04-30 NOTE — Tx Team (Signed)
Interdisciplinary Treatment and Diagnostic Plan Update  04/30/2020 Time of Session: 8:30AM  Alexander Beard MRN: [id number]  Principal Diagnosis: Dementia [facility])  Secondary Diagnoses: Principal Problem:   Dementia ([place]) Active Problems:   Schizoaffective disorder, bipolar type ([place])   Active autistic disorder   Diabetes ([place])   Hypertension   Prostate hypertrophy   Intellectual disability   At risk for elopement   Current Medications:  Current Facility-Administered Medications  Medication Dose Route Frequency Provider Last Rate Last Admin  . acetaminophen (TYLENOL) tablet 650 mg  650 mg Oral Q6H PRN [name], MD      . alum & mag hydroxide-simeth (MAALOX/MYLANTA) 200-200-20 MG/5ML suspension 30 mL  30 mL Oral Q4H PRN [name], MD      . aspirin EC tablet 81 mg  81 mg Oral Daily [name], [name], MD   81 mg at 04/30/20 0825  . clonazePAM (KLONOPIN) tablet 1 mg  1 mg Oral QID [name], [name], MD   1 mg at 04/30/20 0825  . divalproex (DEPAKOTE ER) 24 hr tablet 1,500 mg  1,500 mg Oral QHS [name], [name], MD   1,500 mg at 04/29/20 2102  . gabapentin (NEURONTIN) capsule 100 mg  100 mg Oral TID [name], [name], MD   100 mg at 04/30/20 0825  . hydrOXYzine (ATARAX/VISTARIL) tablet 10 mg  10 mg Oral TID PRN [name], MD   10 mg at 04/20/20 2128  . influenza vac split quadrivalent PF (FLUARIX) injection 0.5 mL  0.5 mL Intramuscular Tomorrow-1000 [name], [name], MD      . lamoTRIgine (LAMICTAL) tablet 100 mg  100 mg Oral QHS [name], [name], MD   100 mg at 04/29/20 2101  . linagliptin (TRADJENTA) tablet 5 mg  5 mg Oral Daily [name], [name], MD   5 mg at 04/30/20 0825  . loperamide (IMODIUM) capsule 2 mg  2 mg Oral Q4H PRN [name], MD   2 mg at 04/10/20 0917  . loratadine (CLARITIN) tablet 10 mg  10 mg Oral Daily [name], [name], MD   10 mg at 04/30/20 0825  . LORazepam (ATIVAN) tablet 2 mg  2 mg Oral Q6H PRN [name], [name], MD   2 mg at 04/07/20 1240  . magnesium  hydroxide (MILK OF MAGNESIA) suspension 30 mL  30 mL Oral Daily PRN [name], MD   30 mL at 03/23/20 1344  . metFORMIN (GLUCOPHAGE) tablet 850 mg  850 mg Oral BID WC [name], [name], MD   850 mg at 04/30/20 0825  . metoprolol succinate (TOPROL-XL) 24 hr tablet 50 mg  50 mg Oral Daily [name], [name], MD   50 mg at 04/29/20 0924  . ondansetron (ZOFRAN) tablet 4 mg  4 mg Oral Q8H PRN [name], [name], MD   4 mg at 04/28/20 0119  . oxybutynin (DITROPAN) tablet 10 mg  10 mg Oral BID [name], [name], MD   10 mg at 04/30/20 0825  . pantoprazole (PROTONIX) EC tablet 40 mg  40 mg Oral Daily [name], [name], MD   40 mg at 04/30/20 0825  . prazosin (MINIPRESS) capsule 1 mg  1 mg Oral QHS [name], [name], MD   1 mg at 04/29/20 2101  . QUEtiapine (SEROQUEL) tablet 100 mg  100 mg Oral QID [name], [name], MD   100 mg at 04/30/20 0825  . QUEtiapine (SEROQUEL) tablet 400 mg  400 mg Oral QHS [name], NP   400  mg at 04/29/20 2101  . sertraline (ZOLOFT) tablet 50 mg  50 mg Oral BID [name], [name], MD   50 mg at 04/30/20 0825  . simethicone (MYLICON) chewable tablet 80 mg  80 mg Oral TID [name], [name], MD   80 mg at 04/30/20 0825  . simvastatin (ZOCOR) tablet 20 mg  20 mg Oral q1800 [name], [name], MD   20 mg at 04/29/20 1703  . tamsulosin (FLOMAX) capsule 0.8 mg  0.8 mg Oral QPC supper [name], [name], MD   0.8 mg at 04/29/20 1705   PTA Medications: Medications Prior to Admission  Medication Sig Dispense Refill Last Dose  . acetaminophen (TYLENOL) 650 MG CR tablet Take 650 mg by mouth every 12 (twelve) hours as needed for pain.     Marland Kitchen aspirin EC 81 MG tablet Take 81 mg by mouth daily.     . Calcium Carbonate-Vitamin D3 (CALCIUM 600-D) 600-400 MG-UNIT TABS Take 1 tablet by mouth 2 (two) times daily.     . Cholecalciferol (VITAMIN D-1000 MAX ST) 1000 UNITS tablet Take 1,000 Units by mouth daily.      Marland Kitchen docusate sodium (COLACE) 100 MG capsule Take 100 mg by mouth daily.      . furosemide  (LASIX) 20 MG tablet Take 20 mg by mouth daily.      Marland Kitchen gabapentin (NEURONTIN) 100 MG capsule Take 1 capsule (100 mg total) by mouth 3 (three) times daily. 90 capsule 10   . Insulin Detemir (LEVEMIR FLEXTOUCH) 100 UNIT/ML Pen Inject 15 Units into the skin daily at 10 pm.     . lamoTRIgine (LAMICTAL) 100 MG tablet Take 1 tablet (100 mg total) by mouth at bedtime. 30 tablet 10   . loratadine (CLARITIN) 10 MG tablet Take 1 tablet (10 mg total) by mouth daily. 30 tablet 5   . LORazepam (ATIVAN) 1 MG tablet Take 1 tablet (1 mg total) by mouth 3 (three) times daily. 90 tablet 5   . lurasidone (LATUDA) 40 MG TABS tablet Take 1 tablet (40 mg total) by mouth daily. with food 30 tablet 10   . metoprolol succinate (TOPROL-XL) 50 MG 24 hr tablet Take 50 mg by mouth daily.      . NON FORMULARY cpap device     . oxybutynin (DITROPAN) 5 MG tablet Take 5 mg by mouth daily.     . pantoprazole (PROTONIX) 40 MG tablet Take 40 mg by mouth daily.     . polyethylene glycol (MIRALAX / GLYCOLAX) packet Take 17 g by mouth daily.     . QUEtiapine (SEROQUEL) 100 MG tablet TAKE 1 TABLET  BY MOUTH THREE TIMES A DAY (Patient taking differently: Take 100 mg by mouth 3 (three) times daily. TAKE 1 TABLET  BY MOUTH THREE TIMES A DAY) 90 tablet 11   . QUEtiapine (SEROQUEL) 400 MG tablet Take 1 tablet (400 mg total) by mouth at bedtime. 30 tablet 10   . sertraline (ZOLOFT) 100 MG tablet Take 2 tablets (200 mg total) by mouth daily. (Patient taking differently: Take 200 mg by mouth daily. Take along with two 50 mg tablets (100 mg) for total 300 mg daily) 60 tablet 5   . sertraline (ZOLOFT) 50 MG tablet Take 2 tablets (100 mg total) by mouth daily. (Patient taking differently: Take 100 mg by mouth daily. Take along with two 100 mg tablets (200 mg) for total 300 mg daily) 60 tablet 5   . simethicone (MYLICON) 80 MG chewable tablet Chew  80-160 mg by mouth 2 (two) times daily as needed for flatulence.     . simvastatin (ZOCOR) 20 MG tablet  Take 20 mg by mouth at bedtime.      . sitaGLIPtin (JANUVIA) 100 MG tablet Take 100 mg by mouth daily.     . solifenacin (VESICARE) 5 MG tablet Take 5 mg by mouth daily.     . Starch (HEMORRHOIDAL RE) Place 1 application rectally 4 (four) times daily as needed (for itching).     . tamsulosin (FLOMAX) 0.4 MG CAPS capsule Take 0.4 mg by mouth daily.       Patient Stressors: Health problems Marital or family conflict Medication change or noncompliance Traumatic event  Patient Strengths: Motivation for treatment/growth Religious Affiliation  Treatment Modalities: Medication Management, Group therapy, Case management,  1 to 1 session with clinician, Psychoeducation, Recreational therapy.   Physician Treatment Plan for Primary Diagnosis: Dementia [facility]) Long Term Goal(s): Improvement in symptoms so as ready for discharge Improvement in symptoms so as ready for discharge   Short Term Goals: Ability to verbalize feelings will improve Ability to demonstrate self-control will improve Ability to maintain clinical measurements within normal limits will improve Compliance with prescribed medications will improve  Medication Management: Evaluate patient's response, side effects, and tolerance of medication regimen.  Therapeutic Interventions: 1 to 1 sessions, Unit Group sessions and Medication administration.  Evaluation of Outcomes: Progressing  Physician Treatment Plan for Secondary Diagnosis: Principal Problem:   Dementia ([place]) Active Problems:   Schizoaffective disorder, bipolar type ([place])   Active autistic disorder   Diabetes ([place])   Hypertension   Prostate hypertrophy   Intellectual disability   At risk for elopement  Long Term Goal(s): Improvement in symptoms so as ready for discharge Improvement in symptoms so as ready for discharge   Short Term Goals: Ability to verbalize feelings will improve Ability to demonstrate self-control will improve Ability to maintain clinical  measurements within normal limits will improve Compliance with prescribed medications will improve     Medication Management: Evaluate patient's response, side effects, and tolerance of medication regimen.  Therapeutic Interventions: 1 to 1 sessions, Unit Group sessions and Medication administration.  Evaluation of Outcomes: Progressing   RN Treatment Plan for Primary Diagnosis: Dementia ([place]) Long Term Goal(s): Knowledge of disease and therapeutic regimen to maintain health will improve  Short Term Goals: Ability to demonstrate self-control, Ability to participate in decision making will improve, Ability to verbalize feelings will improve, Ability to identify and develop effective coping behaviors will improve and Compliance with prescribed medications will improve  Medication Management: RN will administer medications as ordered by provider, will assess and evaluate patient's response and provide education to patient for prescribed medication. RN will report any adverse and/or side effects to prescribing provider.  Therapeutic Interventions: 1 on 1 counseling sessions, Psychoeducation, Medication administration, Evaluate responses to treatment, Monitor vital signs and CBGs as ordered, Perform/monitor CIWA, COWS, AIMS and Fall Risk screenings as ordered, Perform wound care treatments as ordered.  Evaluation of Outcomes: Progressing   LCSW Treatment Plan for Primary Diagnosis: Dementia ([place]) Long Term Goal(s): Safe transition to appropriate next level of care at discharge, Engage patient in therapeutic group addressing interpersonal concerns.  Short Term Goals: Engage patient in aftercare planning with referrals and resources, Increase social support, Increase ability to appropriately verbalize feelings, Increase emotional regulation and Increase skills for wellness and recovery  Therapeutic Interventions: Assess for all discharge needs, 1 to 1 time with Social worker, Explore available  resources  and support systems, Assess for adequacy in community support network, Educate family and significant other(s) on suicide prevention, Complete Psychosocial Assessment, Interpersonal group therapy.  Evaluation of Outcomes: Progressing   Progress in Treatment: Attending groups: Yes. Participating in groups: Yes. Taking medication as prescribed: Yes. Toleration medication: Yes. Family/Significant other contact made: Yes, individual(s) contacted:  Vallery [place], brother  Patient understands diagnosis: Yes. Discussing patient identified problems/goals with staff: Yes. Medical problems stabilized or resolved: Yes. Denies suicidal/homicidal ideation: No. Issues/concerns per patient self-inventory: No. Other: N/a   New problem(s) identified: No, Describe:  None   New Short Term/Long Term Goal(s): Elimination of symptoms of psychosis, medication management for mood stabilization; elimination of SI thoughts; development of comprehensive mental wellness/sobriety plan.Update 03/01/20:No changes at this time. 03/07/20: Update, no changes at this time Update 03/12/2020: No changes at this time.Update 03/17/2020:No changes at this time. Update 03/21/20:No changes at this time. Update 03/26/20: No changes at this time. Update 03/31/2020: No changes at this time.Update 04/04/20: No changes at this time Update 04/09/2020:  No changes at this time.  Update 04/14/2020:  No changes at this time.  Update 04/20/2020:  No changes at this time. Update: 04/25/2020: No changes at this time. Update 04/30/2020:  No changes at this time.   Patient Goals:  Patient stated he would like to go back to [place] and reconnect with his peer support. Patient also stated that he would like to increase his social support and help his brother.Update 03/07/20,no change Update 03/12/2020: No changes at this time. Update 03/17/2020:No changes at this time.Update 03/21/20: No changes at this time. Update 03/26/20: No changes at this  time. Update 03/31/2020: No changes at this time. Update9/25/21: No changes at this time.  Update 04/09/2020:  No changes at this time.  Update 04/14/2020:  No changes at this time.  Update 04/20/2020:  No changes at this time. 04/25/2020: Patient still indicates that he would like to go back to his old ways and life. Patient would like to get back into the community. 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 Update 04/30/2020:  No changes at this time.   Discharge Plan or Barriers:  Patient has an interview with a group home on 03/09/20 at 2:00 PM. Update 03/12/2020: CSW continues to assist the patient in developing a housing plan.Update 03/17/2020:CSW has assisted patient in completing an interview with a group home, however, there has been no word on if the patient has been approved. Patient continues to struggle with his anxiety and nerves. Nurses report that he has increased in bed wetting. Psychiatrist and nurses believe that this may be related to his increased anxiety about finding a home.Update 03/21/20: Patient is still nervous and anxious about finding a new placement. Patient stated he could not stay here too much longer and wanted his life back. Update 03/26/20: CSW continues to look for placement for pt. Group Homes, [facility] are being considered. Update 03/31/2020: Patient continues to have interview with group homes, however, no success at this time in identifying a home that has accepted him.Update: 04/04/20: No changes at this time.  Update 04/09/2020:  CSW continues to look for placement for patient.  Update 04/14/2020:  No changes at this time.  CSW continues to look for placement.  Update 04/20/2020:  CSW continues to assist with placement needs. 04/25/2020: CSW continues to look for placement options. Update 04/30/2020:  CSW continues to look for placement for the patient.  Patient has been  declined for 17 SNF's however, there are a quite a few  where the referral is pending.  CSW has contacted several group homes, however, none have selected the patient.    Reason for Continuation of Hospitalization: Anxiety Depression Medication stabilization  Estimated Length of Stay: TBD  Attendees: Patient:  04/30/2020 11:37 AM  Physician: Dr. Domingo Cocking, MD 04/30/2020 11:37 AM  Nursing:  04/30/2020 11:37 AM  RN Care Manager: 04/30/2020 11:37 AM  Social Worker: Assunta Curtis, MSW, LCSW 04/30/2020 11:37 AM  Recreational Therapist:  04/30/2020 11:37 AM  Other: Gwynneth Munson" [place], LCSW 04/30/2020 11:37 AM  Other:  04/30/2020 11:37 AM  Other: 04/30/2020 11:37 AM    Scribe for Treatment Team: [name], LCSW 04/30/2020 11:37 AM

## 2020-04-30 NOTE — Plan of Care (Signed)
Patient rated his depression and anxiety 0/10. Denies SI,HI and AVH.Patient states " I could handle my anxiety." Patient participated in group activities.Compliant with medications.Appetite and energy level good.Support and encouragement given.

## 2020-04-30 NOTE — Progress Notes (Signed)
Patient pleasant and cooperative. Medication compliant, appropriate with staff and peers. Spends evenings in dayroom drawing. Voiced not complaints or concerns. Denies SI, HI, AVH Encouragement and support provided. Safety checks maintained. Mediations given as prescribed. Pt receptive and remains safe on unit with q 15 min checks.

## 2020-04-30 NOTE — Progress Notes (Signed)
[facility] MD Progress Note  04/30/2020 11:03 AM Alexander Beard  MRN:  [id number]   Subjective:  Alexander Beard seen one-on-one in dayroom.  He had no behavioral issues overnight or today. He continues to ruminate on desire to volunteer in the community. He denies suicidal ideations, homicidal ideations, visual hallucinations, or auditory hallucinations.  He notes that he is upset that no one will accept him to a group home based on past behavior of running away. He feels he has been able to learn how to cope with his panic attacks well enough to not do this in the future. He is hopeful to find a place in [facility] as his niece lives in [place], or [place] since it is nearby.  Principal Problem: Dementia ([place]) Diagnosis: Principal Problem:   Dementia ([place]) Active Problems:   Schizoaffective disorder, bipolar type ([place])   Active autistic disorder   Diabetes ([place])   Hypertension   Prostate hypertrophy   Intellectual disability   At risk for elopement  Total Time spent with patient: 30 minutes  Past Psychiatric History: Past history of longstanding impairment see many previous notes  Past Medical History:  Past Medical History:  Diagnosis Date  . Anxiety   . Anxiety disorder due to known physiological condition    HOSPITALIZED 10/18  . Arthritis   . Autistic disorder, residual state   . COPD (chronic obstructive pulmonary disease) ([place])   . Depression   . Developmental disorder   . Diabetes mellitus without complication ([place])   . Dyslipidemia   . Esophageal reflux   . HOH (hard of hearing)    MILDLY  . Hypertension   . Obesity   . Overactive bladder   . Palpitations    ANXIETY  . Schizophrenia, schizoaffective ([place])   . Sleep apnea   . Tremors of nervous system    HANDS DUE TO MEDICATIONS  . Urinary incontinence     Past Surgical History:  Procedure Laterality Date  . CATARACT EXTRACTION W/PHACO Left 11/14/2017   Procedure: CATARACT EXTRACTION PHACO AND  INTRAOCULAR LENS PLACEMENT (IOC);  Surgeon: Birder Robson, MD;  Location: [facility] ORS;  Service: Ophthalmology;  Laterality: Left;  [place] 00:42 AP% 14.6 CDE 6.12 Fluid pack lot # [id number]  . COLONOSCOPY  01/28/2005  . COLONOSCOPY WITH PROPOFOL N/A 05/09/2016   Procedure: COLONOSCOPY WITH PROPOFOL;  Surgeon: Manya Silvas, MD;  Location: [facility] ENDOSCOPY;  Service: Endoscopy;  Laterality: N/A;  . ESOPHAGOGASTRODUODENOSCOPY N/A 05/09/2016   Procedure: ESOPHAGOGASTRODUODENOSCOPY (EGD);  Surgeon: Manya Silvas, MD;  Location: [facility] ENDOSCOPY;  Service: Endoscopy;  Laterality: N/A;  . FRACTURE SURGERY     ORIF SHOULDER  . TONSILLECTOMY     Family History:  Family History  Problem Relation Age of Onset  . Hypertension Mother   . Stroke Father   . Heart Problems Father    Family Psychiatric  History: see previous Social History:  Social History   Substance and Sexual Activity  Alcohol Use No  . Alcohol/week: 0.0 standard drinks     Social History   Substance and Sexual Activity  Drug Use No    Social History   Socioeconomic History  . Marital status: Single    Spouse name: Not on file  . Number of children: Not on file  . Years of education: Not on file  . Highest education level: Not on file  Occupational History  . Not on file  Tobacco Use  . Smoking status: Never Smoker  . Smokeless  tobacco: Never Used  Vaping Use  . Vaping Use: Never used  Substance and Sexual Activity  . Alcohol use: No    Alcohol/week: 0.0 standard drinks  . Drug use: No  . Sexual activity: Never  Other Topics Concern  . Not on file  Social History Narrative   The patient never finished high school but did get his GED. 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 He is currently in disability and his brother Alexander Beard is his legal guardian. He has been living in group homes for many years.      No pending legal charges   Social  Determinants of Health   Financial Resource Strain:   . Difficulty of Paying Living Expenses: Not on file  Food Insecurity:   . Worried About Charity fundraiser in the Last Year: Not on file  . Ran Out of Food in the Last Year: Not on file  Transportation Needs:   . Lack of Transportation (Medical): Not on file  . Lack of Transportation (Non-Medical): Not on file  Physical Activity:   . Days of Exercise per Week: Not on file  . Minutes of Exercise per Session: Not on file  Stress:   . Feeling of Stress : Not on file  Social Connections:   . Frequency of Communication with Friends and Family: Not on file  . Frequency of Social Gatherings with Friends and Family: Not on file  . Attends Religious Services: Not on file  . Active Member of Clubs or Organizations: Not on file  . Attends Archivist Meetings: Not on file  . Marital Status: Not on file   Additional Social History:                         Sleep: Good  Appetite:  Good  Current Medications: Current Facility-Administered Medications  Medication Dose Route Frequency Provider Last Rate Last Admin  . acetaminophen (TYLENOL) tablet 650 mg  650 mg Oral Q6H PRN [name], MD      . alum & mag hydroxide-simeth (MAALOX/MYLANTA) 200-200-20 MG/5ML suspension 30 mL  30 mL Oral Q4H PRN [name], MD      . aspirin EC tablet 81 mg  81 mg Oral Daily [name], [name], MD   81 mg at 04/30/20 0825  . clonazePAM (KLONOPIN) tablet 1 mg  1 mg Oral QID [name], [name], MD   1 mg at 04/30/20 0825  . divalproex (DEPAKOTE ER) 24 hr tablet 1,500 mg  1,500 mg Oral QHS [name], [name], MD   1,500 mg at 04/29/20 2102  . gabapentin (NEURONTIN) capsule 100 mg  100 mg Oral TID [name], [name], MD   100 mg at 04/30/20 0825  . hydrOXYzine (ATARAX/VISTARIL) tablet 10 mg  10 mg Oral TID PRN [name], MD   10 mg at 04/20/20 2128  . influenza vac split quadrivalent PF (FLUARIX) injection 0.5 mL  0.5 mL Intramuscular  Tomorrow-1000 [name], [name], MD      . lamoTRIgine (LAMICTAL) tablet 100 mg  100 mg Oral QHS [name], [name], MD   100 mg at 04/29/20 2101  . linagliptin (TRADJENTA) tablet 5 mg  5 mg Oral Daily [name], [name], MD   5 mg at 04/30/20 0825  . loperamide (IMODIUM) capsule 2 mg  2 mg Oral Q4H PRN [name], MD   2 mg at  04/10/20 0917  . loratadine (CLARITIN) tablet 10 mg  10 mg Oral Daily [name], [name], MD   10 mg at 04/30/20 0825  . LORazepam (ATIVAN) tablet 2 mg  2 mg Oral Q6H PRN [name], [name], MD   2 mg at 04/07/20 1240  . magnesium hydroxide (MILK OF MAGNESIA) suspension 30 mL  30 mL Oral Daily PRN [name], MD   30 mL at 03/23/20 1344  . metFORMIN (GLUCOPHAGE) tablet 850 mg  850 mg Oral BID WC [name], [name], MD   850 mg at 04/30/20 0825  . metoprolol succinate (TOPROL-XL) 24 hr tablet 50 mg  50 mg Oral Daily [name], [name], MD   50 mg at 04/29/20 0924  . ondansetron (ZOFRAN) tablet 4 mg  4 mg Oral Q8H PRN [name], [name], MD   4 mg at 04/28/20 0119  . oxybutynin (DITROPAN) tablet 10 mg  10 mg Oral BID [name], [name], MD   10 mg at 04/30/20 0825  . pantoprazole (PROTONIX) EC tablet 40 mg  40 mg Oral Daily [name], [name], MD   40 mg at 04/30/20 0825  . prazosin (MINIPRESS) capsule 1 mg  1 mg Oral QHS [name], [name], MD   1 mg at 04/29/20 2101  . QUEtiapine (SEROQUEL) tablet 100 mg  100 mg Oral QID [name], [name], MD   100 mg at 04/30/20 0825  . QUEtiapine (SEROQUEL) tablet 400 mg  400 mg Oral QHS [name], NP   400 mg at 04/29/20 2101  . sertraline (ZOLOFT) tablet 50 mg  50 mg Oral BID [name], [name], MD   50 mg at 04/30/20 0825  . simethicone (MYLICON) chewable tablet 80 mg  80 mg Oral TID [name], [name], MD   80 mg at 04/30/20 0825  . simvastatin (ZOCOR) tablet 20 mg  20 mg Oral q1800 [name], [name], MD   20 mg at 04/29/20 1703  . tamsulosin (FLOMAX) capsule 0.8 mg  0.8 mg Oral QPC supper [name], [name], MD   0.8 mg at 04/29/20 1705    Lab Results: No  results found for this or any previous visit (from the past 48 hour(s)).  Blood Alcohol level:  Lab Results  Component Value Date   ETH <10 01/22/2020   ETH <10 16/04/9603    Metabolic Disorder Labs: Lab Results  Component Value Date   HGBA1C 6.6 (H) 02/20/2020   MPG 142.72 02/20/2020   No results found for: PROLACTIN Lab Results  Component Value Date   CHOL 153 04/10/2015   TRIG 166 (H) 04/10/2015   HDL 50 04/10/2015   CHOLHDL 3.1 04/10/2015   VLDL 33 04/10/2015   LDLCALC 70 04/10/2015    Physical Findings: AIMS: Facial and Oral Movements Muscles of Facial Expression: None, normal Lips and Perioral Area: None, normal Jaw: None, normal Tongue: None, normal,Extremity Movements Upper (arms, wrists, hands, fingers): None, normal Lower (legs, knees, ankles, toes): None, normal, Trunk Movements Neck, shoulders, hips: None, normal, Overall Severity Severity of abnormal movements (highest score from questions above): None, normal Incapacitation due to abnormal movements: None, normal Patient's awareness of abnormal movements (rate only patient's report): No Awareness, Dental Status Current problems with teeth and/or dentures?: No Does patient usually wear dentures?: No  CIWA:    COWS:     Musculoskeletal: Strength & Muscle Tone: within normal limits Gait & Station: normal Patient leans: N/A  Psychiatric Specialty Exam: Physical Exam Vitals and nursing note reviewed.  Constitutional:  Appearance: Normal appearance.  HENT:     Head: Normocephalic and atraumatic.     Right Ear: External ear normal.     Left Ear: External ear normal.     Nose: Nose normal.     Mouth/Throat:     Mouth: Mucous membranes are moist.     Pharynx: Oropharynx is clear.  Eyes:     Extraocular Movements: Extraocular movements intact.     Conjunctiva/sclera: Conjunctivae normal.     Pupils: Pupils are equal, round, and reactive to light.  Cardiovascular:     Rate and Rhythm: Normal  rate.     Pulses: Normal pulses.  Pulmonary:     Effort: Pulmonary effort is normal.     Breath sounds: Normal breath sounds.  Abdominal:     General: Abdomen is flat.     Palpations: Abdomen is soft.  Musculoskeletal:        General: No swelling. Normal range of motion.     Cervical back: Normal range of motion and neck supple.  Skin:    General: Skin is warm and dry.  Neurological:     General: No focal deficit present.     Mental Status: He is alert. Mental status is at baseline.  Psychiatric:        Mood and Affect: Mood normal.        Behavior: Behavior normal.        Thought Content: Thought content normal.        Judgment: Judgment normal.     Review of Systems  Constitutional: Negative for appetite change and fatigue.  HENT: Negative for rhinorrhea and sore throat.   Eyes: Negative for photophobia and visual disturbance.  Respiratory: Negative for cough and shortness of breath.   Cardiovascular: Negative for chest pain and palpitations.  Gastrointestinal: Negative for constipation, diarrhea, nausea and vomiting.  Endocrine: Negative for cold intolerance and heat intolerance.  Genitourinary: Negative for difficulty urinating and dysuria.  Musculoskeletal: Negative for back pain and neck stiffness.  Skin: Negative for rash and wound.  Allergic/Immunologic: Negative for food allergies and immunocompromised state.  Neurological: Negative for dizziness and headaches.  Hematological: Negative for adenopathy. Does not bruise/bleed easily.  Psychiatric/Behavioral: Negative for hallucinations and suicidal ideas.    Blood pressure 104/65, pulse (!) 55, temperature 97.9 F (36.6 C), temperature source Oral, resp. rate 17, height 5\' 8"  (1.727 m), weight 104.3 kg, SpO2 97 %.Body mass index is 34.96 kg/m.  General Appearance: Casual  Eye Contact:  Good  Speech:  Clear and Coherent  Volume:  Normal  Mood:  Hopeless  Affect:  Congruent  Thought Process:  Coherent   Orientation:  Full (Time, Place, and Person)  Thought Content:  Logical  Suicidal Thoughts:  No  Homicidal Thoughts:  No  Memory:  Immediate;   Fair Recent;   Fair Remote;   Fair  Judgement:  Fair  Insight:  Fair  Psychomotor Activity:  Normal  Concentration:  Concentration: Fair and Attention Span: Fair  Recall:  [employer] of Knowledge:  Fair  Language:  Fair  Akathisia:  Negative  Handed:  Right  AIMS (if indicated):     Assets:  Communication Skills Desire for Improvement Resilience Social Support  ADL's:  Intact  Cognition:  WNL  Sleep:  Number of Hours: 7.25     Treatment Plan Summary: Daily contact with patient to assess and evaluate symptoms and progress in treatment and Medication management. Plan: no changes to current medications. Patient is at  baseline, but requires placement.   Salley Scarlet, MD 04/30/2020, 11:03 AM

## 2020-04-30 NOTE — Progress Notes (Signed)
Recreation Therapy Notes  Date: 04/30/2020  Time: 9:30 am  Location: Room 21    Behavioral response: Appropriate   Intervention Topic: Animal Assisted Therapy   Discussion/Intervention:  Animal Assisted Therapy took place today during group.  Animal Assisted Therapy is the planned inclusion of an animal in a patient's treatment plan. The patients were able to engage in therapy with an animal during group. Participants were educated on what a service dog is and the different between a support dog and a service dog. Patient were informed on the many animal needs there are and how their needs are similar. Individuals were enlightened on the process to get a service animal or support animal. Patients got the opportunity to pet the animal and were offered emotional support from the animal and staff.  Clinical Observations/Feedback:  Patient came to group and was on topic and was focused on what peers and staff had to say. Participant shared their experiences and history with animals. Individual was social with peers, staff and animal while participating in group.  Alexander Beard LRT/CTRS         Alexander Beard 04/30/2020 11:50 AM

## 2020-04-30 NOTE — Plan of Care (Signed)
  Problem: Education: Goal: Emotional status will improve Outcome: Progressing Goal: Mental status will improve Outcome: Progressing   Problem: Safety: Goal: Periods of time without injury will increase Outcome: Progressing

## 2020-04-30 NOTE — [facility] Group Notes (Signed)
LCSW Group Therapy Note  04/30/2020 2:46 PM  Type of Therapy/Topic:  Group Therapy:  Balance in Life  Participation Level:  Active  Description of Group:    This group will address the concept of balance and how it feels and looks when one is unbalanced. Patients will be encouraged to process areas in their lives that are out of balance and identify reasons for remaining unbalanced. Facilitators will guide patients in utilizing problem-solving interventions to address and correct the stressor making their life unbalanced. Understanding and applying boundaries will be explored and addressed for obtaining and maintaining a balanced life. Patients will be encouraged to explore ways to assertively make their unbalanced needs known to significant others in their lives, using other group members and facilitator for support and feedback.  Therapeutic Goals: 1. Patient will identify two or more emotions or situations they have that consume much of in their lives. 2. Patient will identify signs/triggers that life has become out of balance:  3. Patient will identify two ways to set boundaries in order to achieve balance in their lives:  4. Patient will demonstrate ability to communicate their needs through discussion and/or role plays  Summary of Patient Progress: Patient was engaged in the conversation even though often not on topic. However, he was easily redirected to the conversation at hand. Patient was able to identify some things that cause him to become unbalanced in life.   Therapeutic Modalities:   Cognitive Behavioral Therapy Solution-Focused Therapy Assertiveness Training  [employer]. [name], MSW, [place], [place] 04/30/2020 2:46 PM

## 2020-05-01 DIAGNOSIS — F0391 Unspecified dementia with behavioral disturbance: Secondary | ICD-10-CM | POA: Diagnosis not present

## 2020-05-01 NOTE — Progress Notes (Signed)
Patient with no new changes. Did complain of nausea this shift. Zofran given with good relief. Denies any SI, HI, AVH. Medication compliant, appropriate with staff and peers.  Encouragement and support offered. Safety checks maintained. Medications given as prescribed. Pt receptive and remains safe on unit with q 15 min checks.

## 2020-05-01 NOTE — Progress Notes (Signed)
[facility] MD Progress Note  05/01/2020 8:29 AM Alexander Beard  MRN:  [id number]   Subjective:  Alexander Beard seen one-on-one in office.  He had no behavioral issues overnight or today. Today he requests that treatment team ensures the patients are able to go outside with the nursing students today. He denies suicidal ideations, homicidal ideations, visual hallucinations, or auditory hallucinations.  He notes that he is upset that no one will accept him to a group home based on past behavior of running away. He feels he has been able to learn how to cope with his panic attacks well enough to not do this in the future. He is hopeful to find a place in [facility] as his niece lives in [place], or [place] since it is nearby.  Principal Problem: Dementia ([place]) Diagnosis: Principal Problem:   Dementia ([place]) Active Problems:   Schizoaffective disorder, bipolar type ([place])   Active autistic disorder   Diabetes ([place])   Hypertension   Prostate hypertrophy   Intellectual disability   At risk for elopement  Total Time spent with patient: 30 minutes  Past Psychiatric History: Past history of longstanding impairment see many previous notes  Past Medical History:  Past Medical History:  Diagnosis Date  . Anxiety   . Anxiety disorder due to known physiological condition    HOSPITALIZED 10/18  . Arthritis   . Autistic disorder, residual state   . COPD (chronic obstructive pulmonary disease) ([place])   . Depression   . Developmental disorder   . Diabetes mellitus without complication ([place])   . Dyslipidemia   . Esophageal reflux   . HOH (hard of hearing)    MILDLY  . Hypertension   . Obesity   . Overactive bladder   . Palpitations    ANXIETY  . Schizophrenia, schizoaffective ([place])   . Sleep apnea   . Tremors of nervous system    HANDS DUE TO MEDICATIONS  . Urinary incontinence     Past Surgical History:  Procedure Laterality Date  . CATARACT EXTRACTION W/PHACO Left 11/14/2017    Procedure: CATARACT EXTRACTION PHACO AND INTRAOCULAR LENS PLACEMENT (IOC);  Surgeon: Birder Robson, MD;  Location: [facility] ORS;  Service: Ophthalmology;  Laterality: Left;  [place] 00:42 AP% 14.6 CDE 6.12 Fluid pack lot # [id number]  . COLONOSCOPY  01/28/2005  . COLONOSCOPY WITH PROPOFOL N/A 05/09/2016   Procedure: COLONOSCOPY WITH PROPOFOL;  Surgeon: Manya Silvas, MD;  Location: [facility] ENDOSCOPY;  Service: Endoscopy;  Laterality: N/A;  . ESOPHAGOGASTRODUODENOSCOPY N/A 05/09/2016   Procedure: ESOPHAGOGASTRODUODENOSCOPY (EGD);  Surgeon: Manya Silvas, MD;  Location: [facility] ENDOSCOPY;  Service: Endoscopy;  Laterality: N/A;  . FRACTURE SURGERY     ORIF SHOULDER  . TONSILLECTOMY     Family History:  Family History  Problem Relation Age of Onset  . Hypertension Mother   . Stroke Father   . Heart Problems Father    Family Psychiatric  History: see previous Social History:  Social History   Substance and Sexual Activity  Alcohol Use No  . Alcohol/week: 0.0 standard drinks     Social History   Substance and Sexual Activity  Drug Use No    Social History   Socioeconomic History  . Marital status: Single    Spouse name: Not on file  . Number of children: Not on file  . Years of education: Not on file  . Highest education level: Not on file  Occupational History  . Not on file  Tobacco Use  .  Smoking status: Never Smoker  . Smokeless tobacco: Never Used  Vaping Use  . Vaping Use: Never used  Substance and Sexual Activity  . Alcohol use: No    Alcohol/week: 0.0 standard drinks  . Drug use: No  . Sexual activity: Never  Other Topics Concern  . Not on file  Social History Narrative   The patient never finished high school but did get his GED. 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 He is currently in disability and [name] is his legal guardian. He has been living in group homes for many years.      No  pending legal charges   Social Determinants of Health   Financial Resource Strain:   . Difficulty of Paying Living Expenses: Not on file  Food Insecurity:   . Worried About Charity fundraiser in the Last Year: Not on file  . Ran Out of Food in the Last Year: Not on file  Transportation Needs:   . Lack of Transportation (Medical): Not on file  . Lack of Transportation (Non-Medical): Not on file  Physical Activity:   . Days of Exercise per Week: Not on file  . Minutes of Exercise per Session: Not on file  Stress:   . Feeling of Stress : Not on file  Social Connections:   . Frequency of Communication with Friends and Family: Not on file  . Frequency of Social Gatherings with Friends and Family: Not on file  . Attends Religious Services: Not on file  . Active Member of Clubs or Organizations: Not on file  . Attends Archivist Meetings: Not on file  . Marital Status: Not on file   Additional Social History:                         Sleep: Good  Appetite:  Good  Current Medications: Current Facility-Administered Medications  Medication Dose Route Frequency Provider Last Rate Last Admin  . acetaminophen (TYLENOL) tablet 650 mg  650 mg Oral Q6H PRN [name], MD      . alum & mag hydroxide-simeth (MAALOX/MYLANTA) 200-200-20 MG/5ML suspension 30 mL  30 mL Oral Q4H PRN [name], MD      . aspirin EC tablet 81 mg  81 mg Oral Daily [name], [name], MD   81 mg at 05/01/20 0811  . clonazePAM (KLONOPIN) tablet 1 mg  1 mg Oral QID [name], [name], MD   1 mg at 05/01/20 0811  . divalproex (DEPAKOTE ER) 24 hr tablet 1,500 mg  1,500 mg Oral QHS [name], [name], MD   1,500 mg at 04/30/20 2032  . gabapentin (NEURONTIN) capsule 100 mg  100 mg Oral TID [name], [name], MD   100 mg at 05/01/20 0811  . hydrOXYzine (ATARAX/VISTARIL) tablet 10 mg  10 mg Oral TID PRN [name], MD   10 mg at 04/20/20 2128  . influenza vac split quadrivalent PF (FLUARIX)  injection 0.5 mL  0.5 mL Intramuscular Tomorrow-1000 [name], [name], MD      . lamoTRIgine (LAMICTAL) tablet 100 mg  100 mg Oral QHS [name], [name], MD   100 mg at 04/30/20 2032  . linagliptin (TRADJENTA) tablet 5 mg  5 mg Oral Daily [name], [name], MD   5 mg at 05/01/20 0811  . loperamide (IMODIUM) capsule 2 mg  2 mg Oral Q4H PRN Daneil Dolin,  Everlene Farrier, MD   2 mg at 04/10/20 0917  . loratadine (CLARITIN) tablet 10 mg  10 mg Oral Daily [name], [name], MD   10 mg at 05/01/20 0811  . LORazepam (ATIVAN) tablet 2 mg  2 mg Oral Q6H PRN [name], [name], MD   2 mg at 04/07/20 1240  . magnesium hydroxide (MILK OF MAGNESIA) suspension 30 mL  30 mL Oral Daily PRN [name], MD   30 mL at 03/23/20 1344  . metFORMIN (GLUCOPHAGE) tablet 850 mg  850 mg Oral BID WC [name], [name], MD   850 mg at 05/01/20 2426  . metoprolol succinate (TOPROL-XL) 24 hr tablet 50 mg  50 mg Oral Daily [name], [name], MD   50 mg at 05/01/20 0811  . ondansetron (ZOFRAN) tablet 4 mg  4 mg Oral Q8H PRN [name], [name], MD   4 mg at 04/30/20 2035  . oxybutynin (DITROPAN) tablet 10 mg  10 mg Oral BID [name], [name], MD   10 mg at 05/01/20 8341  . pantoprazole (PROTONIX) EC tablet 40 mg  40 mg Oral Daily [name], [name], MD   40 mg at 05/01/20 0811  . prazosin (MINIPRESS) capsule 1 mg  1 mg Oral QHS [name], [name], MD   1 mg at 04/30/20 2032  . QUEtiapine (SEROQUEL) tablet 100 mg  100 mg Oral QID [name], [name], MD   100 mg at 05/01/20 0811  . QUEtiapine (SEROQUEL) tablet 400 mg  400 mg Oral QHS [name], NP   400 mg at 04/30/20 2033  . sertraline (ZOLOFT) tablet 50 mg  50 mg Oral BID [name], [name], MD   50 mg at 05/01/20 0811  . simethicone (MYLICON) chewable tablet 80 mg  80 mg Oral TID [name], [name], MD   80 mg at 05/01/20 0811  . simvastatin (ZOCOR) tablet 20 mg  20 mg Oral q1800 [name], [name], MD   20 mg at 04/30/20 1708  . tamsulosin (FLOMAX) capsule 0.8 mg  0.8 mg Oral QPC supper [name], [name], MD   0.8 mg  at 04/30/20 1708    Lab Results: No results found for this or any previous visit (from the past 48 hour(s)).  Blood Alcohol level:  Lab Results  Component Value Date   ETH <10 01/22/2020   ETH <10 96/22/2979    Metabolic Disorder Labs: Lab Results  Component Value Date   HGBA1C 6.6 (H) 02/20/2020   MPG 142.72 02/20/2020   No results found for: PROLACTIN Lab Results  Component Value Date   CHOL 153 04/10/2015   TRIG 166 (H) 04/10/2015   HDL 50 04/10/2015   CHOLHDL 3.1 04/10/2015   VLDL 33 04/10/2015   LDLCALC 70 04/10/2015    Physical Findings: AIMS: Facial and Oral Movements Muscles of Facial Expression: None, normal Lips and Perioral Area: None, normal Jaw: None, normal Tongue: None, normal,Extremity Movements Upper (arms, wrists, hands, fingers): None, normal Lower (legs, knees, ankles, toes): None, normal, Trunk Movements Neck, shoulders, hips: None, normal, Overall Severity Severity of abnormal movements (highest score from questions above): None, normal Incapacitation due to abnormal movements: None, normal Patient's awareness of abnormal movements (rate only patient's report): No Awareness, Dental Status Current problems with teeth and/or dentures?: No Does patient usually wear dentures?: No  CIWA:    COWS:     Musculoskeletal: Strength & Muscle Tone: within normal limits Gait & Station: normal Patient leans: N/A  Psychiatric Specialty Exam: Physical Exam  Vitals and nursing note reviewed.  Constitutional:      Appearance: Normal appearance.  HENT:     Head: Normocephalic and atraumatic.     Right Ear: External ear normal.     Left Ear: External ear normal.     Nose: Nose normal.     Mouth/Throat:     Mouth: Mucous membranes are moist.     Pharynx: Oropharynx is clear.  Eyes:     Extraocular Movements: Extraocular movements intact.     Conjunctiva/sclera: Conjunctivae normal.     Pupils: Pupils are equal, round, and reactive to light.   Cardiovascular:     Rate and Rhythm: Normal rate.     Pulses: Normal pulses.  Pulmonary:     Effort: Pulmonary effort is normal.     Breath sounds: Normal breath sounds.  Abdominal:     General: Abdomen is flat.     Palpations: Abdomen is soft.  Musculoskeletal:        General: No swelling. Normal range of motion.     Cervical back: Normal range of motion and neck supple.  Skin:    General: Skin is warm and dry.  Neurological:     General: No focal deficit present.     Mental Status: He is alert. Mental status is at baseline.  Psychiatric:        Mood and Affect: Mood normal.        Behavior: Behavior normal.        Thought Content: Thought content normal.        Judgment: Judgment normal.     Review of Systems  Constitutional: Negative for appetite change and fatigue.  HENT: Negative for rhinorrhea and sore throat.   Eyes: Negative for photophobia and visual disturbance.  Respiratory: Negative for cough and shortness of breath.   Cardiovascular: Negative for chest pain and palpitations.  Gastrointestinal: Negative for constipation, diarrhea, nausea and vomiting.  Endocrine: Negative for cold intolerance and heat intolerance.  Genitourinary: Negative for difficulty urinating and dysuria.  Musculoskeletal: Negative for back pain and neck stiffness.  Skin: Negative for rash and wound.  Allergic/Immunologic: Negative for food allergies and immunocompromised state.  Neurological: Negative for dizziness and headaches.  Hematological: Negative for adenopathy. Does not bruise/bleed easily.  Psychiatric/Behavioral: Negative for hallucinations and suicidal ideas.    Blood pressure 124/73, pulse 77, temperature 97.9 F (36.6 C), temperature source Oral, resp. rate 18, height 5\' 8"  (1.727 m), weight 104.3 kg, SpO2 96 %.Body mass index is 34.96 kg/m.  General Appearance: Casual  Eye Contact:  Good  Speech:  Clear and Coherent  Volume:  Normal  Mood:  Hopeless  Affect:  Congruent   Thought Process:  Coherent  Orientation:  Full (Time, Place, and Person)  Thought Content:  Logical  Suicidal Thoughts:  No  Homicidal Thoughts:  No  Memory:  Immediate;   Fair Recent;   Fair Remote;   Fair  Judgement:  Fair  Insight:  Fair  Psychomotor Activity:  Normal  Concentration:  Concentration: Fair and Attention Span: Fair  Recall:  [employer] of Knowledge:  Fair  Language:  Fair  Akathisia:  Negative  Handed:  Right  AIMS (if indicated):     Assets:  Communication Skills Desire for Improvement Resilience Social Support  ADL's:  Intact  Cognition:  WNL  Sleep:  Number of Hours: 7.25     Treatment Plan Summary: Daily contact with patient to assess and evaluate symptoms and progress in treatment and  Medication management. Plan: no changes to current medications. Patient is at baseline, but requires placement.   Salley Scarlet, MD 05/01/2020, 8:29 AM

## 2020-05-01 NOTE — Progress Notes (Signed)
Recreation Therapy Notes  Date: 05/01/2020  Time: 9:30 am   Location: Craft room   Behavioral response: Appropriate  Intervention Topic: Communication   Discussion/Intervention:  Group content today was focused on communication. The group defined communication and ways to communicate with others. Individuals stated reason why communication is important and some reasons to communicate with others. Patients expressed if they thought they were good at communicating with others and ways they could improve their communication skills. The group identified important parts of communication and some experiences they have had in the past with communication. The group participated in the intervention "What is that?", where they had a chance to test out their communication skills and identify ways to improve their communication techniques.  Clinical Observations/Feedback: Patient came to group and defined communication as getting throughts across to someone.He stated he communicates best by singing and listening to music. Participant explained that communication should be focused on the resolution instead of the problem.  Individual was social with peers and staff while participating in the intervention.  Alexander Beard LRT/CTRS           Tashawn Laswell 05/01/2020 12:08 PM

## 2020-05-01 NOTE — [facility] Group Notes (Signed)
LCSW Group Therapy Note  05/01/2020 2:11 PM  Type of Therapy and Topic:  Group Therapy:  Feelings around Relapse and Recovery  Participation Level:  Active   Description of Group:    Patients in this group will discuss emotions they experience before and after a relapse. They will process how experiencing these feelings, or avoidance of experiencing them, relates to having a relapse. Facilitator will guide patients to explore emotions they have related to recovery. Patients will be encouraged to process which emotions are more powerful. They will be guided to discuss the emotional reaction significant others in their lives may have to their relapse or recovery. Patients will be assisted in exploring ways to respond to the emotions of others without this contributing to a relapse.  Therapeutic Goals: 1. Patient will identify two or more emotions that lead to a relapse for them 2. Patient will identify two emotions that result when they relapse 3. Patient will identify two emotions related to recovery 4. Patient will demonstrate ability to communicate their needs through discussion and/or role plays   Summary of Patient Progress: Patient was present in group. 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  Patient again wished to discuss his displeasure with placement, however, was able to be redirected. Patient then shared the progresses that he has made on the unit.    Therapeutic Modalities:   Cognitive Behavioral Therapy Solution-Focused Therapy Assertiveness Training Relapse Prevention Therapy   Assunta Curtis, MSW, LCSW 05/01/2020 2:11 PM

## 2020-05-01 NOTE — [facility] Counselor (Signed)
CSW called Sula Rumple, [phone] to check on previous conversation on referral.  CSW was informed that he has not received the paperwork and asked that it be sent to [phone].  CSW contact [name] who is assisting with placement and had to leave a HIPAA compliant voicemail.    Assunta Curtis, MSW, LCSW 05/01/2020 3:19 PM

## 2020-05-01 NOTE — Progress Notes (Deleted)
Patient who has been here for quite a while now with developmental disability dementia schizoaffective disorder.  Some days are better than others.  Usually responds well to gentle soothing and support and encouragement for him to use his coping skills and benzodiazepines if needed.  No other change to treatment plan

## 2020-05-01 NOTE — Progress Notes (Signed)
D: Pt alert and oriented. Pt rates depression 0/10, hopelessness 0/10, and anxiety 0/10. Pt goal: don't worry, stay calm , remain hopeful. Pt reports energy level as normal and concentration as being good. Pt reports sleep last night as being good. Pt did not receive medications for sleep. Pt denies experiencing any pain at this time. Pt denies experiencing any SI/HI, or AVH at this time.   A: Scheduled medications administered to pt, per MD orders. Support and encouragement provided. Frequent verbal contact made. Routine safety checks conducted q15 minutes.   R: No adverse drug reactions noted. Pt verbally contracts for safety at this time. Pt complaint with medications and treatment plan. Pt interacts well with others on the unit. Pt remains safe at this time. Will continue to monitor.

## 2020-05-01 NOTE — Plan of Care (Signed)

## 2020-05-02 NOTE — [facility] Group Notes (Signed)
Patient was present in [place] group.   Assunta Curtis, MSW, LCSW 05/02/2020 2:24 PM

## 2020-05-02 NOTE — Progress Notes (Signed)
D- Patient alert and oriented. Pt affect/mood is calm, cooperative but worried at times. Pt denies SI, HI, AVH, and pain. Pt went to groups and outdoors.   A- Scheduled medications administered to patient, per MD orders. Support and encouragement provided.  Routine safety checks conducted every 15 minutes.  Patient informed to notify staff with problems or concerns.  R- No adverse drug reactions noted. Patient contracts for safety at this time. Patient compliant with medications and treatment plan. Pt received a PRN for anxiety.  Patient receptive, calm, and cooperative. Patient interacts well with others on the unit.  Patient remains safe at this time.  Collier Bullock RN

## 2020-05-02 NOTE — [facility] Group Notes (Signed)
[facility] Group Notes:  (Nursing/MHT/Case Management/Adjunct)  Date:  05/02/2020  Time:  8:41 PM  Type of Therapy:  Group Therapy  Participation Level:  Active  Participation Quality:  Appropriate  Affect:  Appropriate  Cognitive:  Alert  Insight:  Good  Engagement in Group:  Engaged and want to enjoy his self.  Modes of Intervention:  Support  Summary of Progress/Problems:  Nehemiah Settle 05/02/2020, 8:41 PM

## 2020-05-02 NOTE — Progress Notes (Signed)
Patient is cooperative with treatment. He denies SI,HI and AVH. Compliant with medications. Appetite and energy level good. Patient seemed to sleep well through out the night.

## 2020-05-02 NOTE — Progress Notes (Signed)
[facility] MD Progress Note  05/02/2020 11:26 AM Alexander Beard  MRN:  [id number]   Subjective:   Patient is mild-mannered today, interviewed in his room today.  He is starts off by expressing his frustrations about not being accepted to any group homes.  Says that he wants to reach out to the Mayor's committee and also [facility] to see if they can help.  Points out that when he makes these phone calls, wants them to be private and not have the nurses or staff listening.  Also frustrated about the restrictions on the unit such as not being able to stay by himself.  Says that he has some TV shows that he wants to watch which he cannot while here.  He does enjoy going to [employer] and interacting with others.  Sleeping well.  Denies hallucinations.  Denies paranoia.  Principal Problem: Dementia ([place]) Diagnosis: Principal Problem:   Dementia ([place]) Active Problems:   Schizoaffective disorder, bipolar type ([place])   Active autistic disorder   Diabetes ([place])   Hypertension   Prostate hypertrophy   Intellectual disability   At risk for elopement  Total Time spent with patient: 30 minutes  Past Psychiatric History: Past history of longstanding impairment see many previous notes  Past Medical History:  Past Medical History:  Diagnosis Date  . Anxiety   . Anxiety disorder due to known physiological condition    HOSPITALIZED 10/18  . Arthritis   . Autistic disorder, residual state   . COPD (chronic obstructive pulmonary disease) ([place])   . Depression   . Developmental disorder   . Diabetes mellitus without complication ([place])   . Dyslipidemia   . Esophageal reflux   . HOH (hard of hearing)    MILDLY  . Hypertension   . Obesity   . Overactive bladder   . Palpitations    ANXIETY  . Schizophrenia, schizoaffective ([place])   . Sleep apnea   . Tremors of nervous system    HANDS DUE TO MEDICATIONS  . Urinary incontinence     Past Surgical History:  Procedure Laterality Date  . CATARACT EXTRACTION  W/PHACO Left 11/14/2017   Procedure: CATARACT EXTRACTION PHACO AND INTRAOCULAR LENS PLACEMENT (IOC);  Surgeon: Birder Robson, MD;  Location: [facility] ORS;  Service: Ophthalmology;  Laterality: Left;  [place] 00:42 AP% 14.6 CDE 6.12 Fluid pack lot # [id number]  . COLONOSCOPY  01/28/2005  . COLONOSCOPY WITH PROPOFOL N/A 05/09/2016   Procedure: COLONOSCOPY WITH PROPOFOL;  Surgeon: Manya Silvas, MD;  Location: [facility] ENDOSCOPY;  Service: Endoscopy;  Laterality: N/A;  . ESOPHAGOGASTRODUODENOSCOPY N/A 05/09/2016   Procedure: ESOPHAGOGASTRODUODENOSCOPY (EGD);  Surgeon: Manya Silvas, MD;  Location: [facility] ENDOSCOPY;  Service: Endoscopy;  Laterality: N/A;  . FRACTURE SURGERY     ORIF SHOULDER  . TONSILLECTOMY     Family History:  Family History  Problem Relation Age of Onset  . Hypertension Mother   . Stroke Father   . Heart Problems Father    Family Psychiatric  History: see previous Social History:  Social History   Substance and Sexual Activity  Alcohol Use No  . Alcohol/week: 0.0 standard drinks     Social History   Substance and Sexual Activity  Drug Use No    Social History   Socioeconomic History  . Marital status: Single    Spouse name: Not on file  . Number of children: Not on file  . Years of education: Not on file  . Highest education level: Not on file  Occupational History  . Not on file  Tobacco Use  . Smoking status: Never Smoker  . Smokeless tobacco: Never Used  Vaping Use  . Vaping Use: Never used  Substance and Sexual Activity  . Alcohol use: No    Alcohol/week: 0.0 standard drinks  . Drug use: No  . Sexual activity: Never  Other Topics Concern  . Not on file  Social History Narrative   The patient never finished high school but did get his GED. 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 He is currently in disability and [name] is his legal guardian. He has been living in group homes for  many years.      No pending legal charges   Social Determinants of Health   Financial Resource Strain:   . Difficulty of Paying Living Expenses: Not on file  Food Insecurity:   . Worried About Charity fundraiser in the Last Year: Not on file  . Ran Out of Food in the Last Year: Not on file  Transportation Needs:   . Lack of Transportation (Medical): Not on file  . Lack of Transportation (Non-Medical): Not on file  Physical Activity:   . Days of Exercise per Week: Not on file  . Minutes of Exercise per Session: Not on file  Stress:   . Feeling of Stress : Not on file  Social Connections:   . Frequency of Communication with Friends and Family: Not on file  . Frequency of Social Gatherings with Friends and Family: Not on file  . Attends Religious Services: Not on file  . Active Member of Clubs or Organizations: Not on file  . Attends Archivist Meetings: Not on file  . Marital Status: Not on file   Additional Social History:                         Sleep: Good  Appetite:  Good  Current Medications: Current Facility-Administered Medications  Medication Dose Route Frequency Provider Last Rate Last Admin  . acetaminophen (TYLENOL) tablet 650 mg  650 mg Oral Q6H PRN [name], MD      . alum & mag hydroxide-simeth (MAALOX/MYLANTA) 200-200-20 MG/5ML suspension 30 mL  30 mL Oral Q4H PRN [name], MD      . aspirin EC tablet 81 mg  81 mg Oral Daily [name], [name], MD   81 mg at 05/02/20 0819  . clonazePAM (KLONOPIN) tablet 1 mg  1 mg Oral QID [name], [name], MD   1 mg at 05/02/20 0819  . divalproex (DEPAKOTE ER) 24 hr tablet 1,500 mg  1,500 mg Oral QHS [name], [name], MD   1,500 mg at 05/01/20 2131  . gabapentin (NEURONTIN) capsule 100 mg  100 mg Oral TID [name], [name], MD   100 mg at 05/02/20 0818  . hydrOXYzine (ATARAX/VISTARIL) tablet 10 mg  10 mg Oral TID PRN [name], MD   10 mg at 04/20/20 2128  . influenza vac split  quadrivalent PF (FLUARIX) injection 0.5 mL  0.5 mL Intramuscular Tomorrow-1000 [name], [name], MD      . lamoTRIgine (LAMICTAL) tablet 100 mg  100 mg Oral QHS [name], [name], MD   100 mg at 05/01/20 2131  . linagliptin (TRADJENTA) tablet 5 mg  5 mg Oral Daily [name], [name], MD   5 mg at 05/02/20 0819  .  loperamide (IMODIUM) capsule 2 mg  2 mg Oral Q4H PRN [name], MD   2 mg at 04/10/20 0917  . loratadine (CLARITIN) tablet 10 mg  10 mg Oral Daily [name], [name], MD   10 mg at 05/02/20 0819  . LORazepam (ATIVAN) tablet 2 mg  2 mg Oral Q6H PRN [name], [name], MD   2 mg at 04/07/20 1240  . magnesium hydroxide (MILK OF MAGNESIA) suspension 30 mL  30 mL Oral Daily PRN [name], MD   30 mL at 03/23/20 1344  . metFORMIN (GLUCOPHAGE) tablet 850 mg  850 mg Oral BID WC [name], [name], MD   850 mg at 05/02/20 0817  . metoprolol succinate (TOPROL-XL) 24 hr tablet 50 mg  50 mg Oral Daily [name], [name], MD   50 mg at 05/01/20 0811  . ondansetron (ZOFRAN) tablet 4 mg  4 mg Oral Q8H PRN [name], [name], MD   4 mg at 04/30/20 2035  . oxybutynin (DITROPAN) tablet 10 mg  10 mg Oral BID [name], [name], MD   10 mg at 05/02/20 0820  . pantoprazole (PROTONIX) EC tablet 40 mg  40 mg Oral Daily [name], [name], MD   40 mg at 05/02/20 0818  . prazosin (MINIPRESS) capsule 1 mg  1 mg Oral QHS [name], [name], MD   1 mg at 05/01/20 2131  . QUEtiapine (SEROQUEL) tablet 100 mg  100 mg Oral QID [name], [name], MD   100 mg at 05/02/20 0820  . QUEtiapine (SEROQUEL) tablet 400 mg  400 mg Oral QHS [name], NP   400 mg at 05/01/20 2132  . sertraline (ZOLOFT) tablet 50 mg  50 mg Oral BID [name], [name], MD   50 mg at 05/02/20 0819  . simethicone (MYLICON) chewable tablet 80 mg  80 mg Oral TID [name], [name], MD   80 mg at 05/02/20 0817  . simvastatin (ZOCOR) tablet 20 mg  20 mg Oral q1800 [name], [name], MD   20 mg at 05/01/20 1722  . tamsulosin (FLOMAX) capsule 0.8 mg  0.8 mg Oral QPC supper  [name], [name], MD   0.8 mg at 05/01/20 1722    Lab Results: No results found for this or any previous visit (from the past 48 hour(s)).  Blood Alcohol level:  Lab Results  Component Value Date   ETH <10 01/22/2020   ETH <10 24/40/1027    Metabolic Disorder Labs: Lab Results  Component Value Date   HGBA1C 6.6 (H) 02/20/2020   MPG 142.72 02/20/2020   No results found for: PROLACTIN Lab Results  Component Value Date   CHOL 153 04/10/2015   TRIG 166 (H) 04/10/2015   HDL 50 04/10/2015   CHOLHDL 3.1 04/10/2015   VLDL 33 04/10/2015   LDLCALC 70 04/10/2015    Physical Findings: AIMS: Facial and Oral Movements Muscles of Facial Expression: None, normal Lips and Perioral Area: None, normal Jaw: None, normal Tongue: None, normal,Extremity Movements Upper (arms, wrists, hands, fingers): None, normal Lower (legs, knees, ankles, toes): None, normal, Trunk Movements Neck, shoulders, hips: None, normal, Overall Severity Severity of abnormal movements (highest score from questions above): None, normal Incapacitation due to abnormal movements: None, normal Patient's awareness of abnormal movements (rate only patient's report): No Awareness, Dental Status Current problems with teeth and/or dentures?: No Does patient usually wear dentures?: No  CIWA:    COWS:     Musculoskeletal: Strength & Muscle Tone: within normal limits Gait &  Station: normal Patient leans: N/A  Psychiatric Specialty Exam: Physical Exam Vitals and nursing note reviewed.  Constitutional:      Appearance: Normal appearance.  HENT:     Head: Normocephalic and atraumatic.     Right Ear: External ear normal.     Left Ear: External ear normal.     Nose: Nose normal.     Mouth/Throat:     Mouth: Mucous membranes are moist.     Pharynx: Oropharynx is clear.  Eyes:     Extraocular Movements: Extraocular movements intact.     Conjunctiva/sclera: Conjunctivae normal.     Pupils: Pupils are equal, round, and  reactive to light.  Cardiovascular:     Rate and Rhythm: Normal rate.     Pulses: Normal pulses.  Pulmonary:     Effort: Pulmonary effort is normal.     Breath sounds: Normal breath sounds.  Abdominal:     General: Abdomen is flat.     Palpations: Abdomen is soft.  Musculoskeletal:        General: No swelling. Normal range of motion.     Cervical back: Normal range of motion and neck supple.  Skin:    General: Skin is warm and dry.  Neurological:     General: No focal deficit present.     Mental Status: He is alert. Mental status is at baseline.  Psychiatric:        Mood and Affect: Mood normal.        Behavior: Behavior normal.        Thought Content: Thought content normal.        Judgment: Judgment normal.     Review of Systems  Constitutional: Negative for appetite change and fatigue.  HENT: Negative for rhinorrhea and sore throat.   Eyes: Negative for photophobia and visual disturbance.  Respiratory: Negative for cough and shortness of breath.   Cardiovascular: Negative for chest pain and palpitations.  Gastrointestinal: Negative for constipation, diarrhea, nausea and vomiting.  Endocrine: Negative for cold intolerance and heat intolerance.  Genitourinary: Negative for difficulty urinating and dysuria.  Musculoskeletal: Negative for back pain and neck stiffness.  Skin: Negative for rash and wound.  Allergic/Immunologic: Negative for food allergies and immunocompromised state.  Neurological: Negative for dizziness and headaches.  Hematological: Negative for adenopathy. Does not bruise/bleed easily.  Psychiatric/Behavioral: Negative for hallucinations and suicidal ideas.    Blood pressure 109/69, pulse (!) 59, temperature 97.7 F (36.5 C), temperature source Oral, resp. rate 18, height 5\' 8"  (1.727 m), weight 104.3 kg, SpO2 96 %.Body mass index is 34.96 kg/m.  General Appearance: Casual  Eye Contact:  Good  Speech:  Clear and Coherent  Volume:  Normal  Mood:   Hopeless  Affect:  Congruent  Thought Process:  Coherent  Orientation:  Full (Time, Place, and Person)  Thought Content:  Logical  Suicidal Thoughts:  No  Homicidal Thoughts:  No  Memory:  Immediate;   Fair Recent;   Fair Remote;   Fair  Judgement:  Fair  Insight:  Fair  Psychomotor Activity:  Normal  Concentration:  Concentration: Fair and Attention Span: Fair  Recall:  [employer] of Knowledge:  Fair  Language:  Fair  Akathisia:  Negative  Handed:  Right  AIMS (if indicated):     Assets:  Communication Skills Desire for Improvement Resilience Social Support  ADL's:  Intact  Cognition:  WNL  Sleep:  Number of Hours: 8     Treatment Plan Summary: Daily contact  with patient to assess and evaluate symptoms and progress in treatment and Medication management. Plan: no changes to current medications. Patient is at baseline, but requires placement.   05/02/20 No changes  [name], MD 05/02/2020, 11:26 AM

## 2020-05-02 NOTE — [facility] Group Notes (Signed)
LCSW Group Therapy Note  05/02/2020  1:00 PM   Type of Therapy and Topic:  Group Therapy:  Trust and Honesty   Participation Level:  Active   Description of Group:    In this group patients will be asked to explore the value of being honest.  Patients will be guided to discuss their thoughts, feelings, and behaviors related to honesty and trusting in others. Patients will process together how trust and honesty relate to forming relationships with peers, family members, and self. Each patient will be challenged to identify and express feelings of being vulnerable. Patients will discuss reasons why people are dishonest and identify alternative outcomes if one was truthful (to self or others). This group will be process-oriented, with patients participating in exploration of their own experiences, giving and receiving support, and processing challenge from other group members.   Therapeutic Goals: 1. Patient will identify why honesty is important to relationships and how honesty overall affects relationships.  2. Patient will identify a situation where they lied or were lied too and the  feelings, thought process, and behaviors surrounding the situation 3. Patient will identify the meaning of being vulnerable, how that feels, and how that correlates to being honest with self and others. 4. Patient will identify situations where they could have told the truth, but instead lied and explain reasons of dishonesty.   Summary of Patient Progress:  Patient was present in group.  Patient was supportive of other group members.  Patient shared that he thinks that even once trust is broken between two parties, the parties can rebuild their trust while recognizing and respecting their differences.      Therapeutic Modalities:   Cognitive Behavioral Therapy Solution Focused Therapy Motivational Interviewing Brief Therapy  Assunta Curtis, MSW, LCSW 05/02/2020 10:57 AM

## 2020-05-02 NOTE — Plan of Care (Signed)
Pt denies depression, anxiety, SI, HI and AVH. Pt was educated on care plan and verbalizes understanding. Collier Bullock Rn Problem: Education: Goal: Knowledge of [facility] Education information/materials will improve 05/02/2020 1533 by Kieth Brightly, RN Outcome: Progressing 05/02/2020 1532 by Kieth Brightly, RN Outcome: Progressing Goal: Emotional status will improve 05/02/2020 1533 by Kieth Brightly, RN Outcome: Progressing 05/02/2020 1532 by Kieth Brightly, RN Outcome: Progressing Goal: Mental status will improve 05/02/2020 1533 by Kieth Brightly, RN Outcome: Progressing 05/02/2020 1532 by Kieth Brightly, RN Outcome: Progressing Goal: Verbalization of understanding the information provided will improve 05/02/2020 1533 by Kieth Brightly, RN Outcome: Progressing 05/02/2020 1532 by Kieth Brightly, RN Outcome: Progressing   Problem: Activity: Goal: Interest or engagement in activities will improve 05/02/2020 1533 by Kieth Brightly, RN Outcome: Progressing 05/02/2020 1532 by Kieth Brightly, RN Outcome: Progressing Goal: Sleeping patterns will improve 05/02/2020 1533 by Kieth Brightly, RN Outcome: Progressing 05/02/2020 1532 by Kieth Brightly, RN Outcome: Progressing   Problem: Coping: Goal: Ability to verbalize frustrations and anger appropriately will improve 05/02/2020 1533 by Kieth Brightly, RN Outcome: Progressing 05/02/2020 1532 by Kieth Brightly, RN Outcome: Progressing Goal: Ability to demonstrate self-control will improve 05/02/2020 1533 by Kieth Brightly, RN Outcome: Progressing 05/02/2020 1532 by Kieth Brightly, RN Outcome: Progressing   Problem: Health Behavior/Discharge Planning: Goal: Identification of resources available to assist in meeting health care needs will improve 05/02/2020 1533 by Kieth Brightly, RN Outcome: Progressing 05/02/2020 1532 by Kieth Brightly, RN Outcome: Progressing Goal: Compliance with treatment plan  for underlying cause of condition will improve 05/02/2020 1533 by Kieth Brightly, RN Outcome: Progressing 05/02/2020 1532 by Kieth Brightly, RN Outcome: Progressing   Problem: Physical Regulation: Goal: Ability to maintain clinical measurements within normal limits will improve 05/02/2020 1533 by Kieth Brightly, RN Outcome: Progressing 05/02/2020 1532 by Kieth Brightly, RN Outcome: Progressing   Problem: Safety: Goal: Periods of time without injury will increase 05/02/2020 1533 by Kieth Brightly, RN Outcome: Progressing 05/02/2020 1532 by Kieth Brightly, RN Outcome: Progressing   Problem: Education: Goal: Knowledge of [facility] Education information/materials will improve 05/02/2020 1533 by Kieth Brightly, RN Outcome: Progressing 05/02/2020 1532 by Kieth Brightly, RN Outcome: Progressing Goal: Emotional status will improve 05/02/2020 1533 by Kieth Brightly, RN Outcome: Progressing 05/02/2020 1532 by Kieth Brightly, RN Outcome: Progressing Goal: Mental status will improve 05/02/2020 1533 by Kieth Brightly, RN Outcome: Progressing 05/02/2020 1532 by Kieth Brightly, RN Outcome: Progressing Goal: Verbalization of understanding the information provided will improve 05/02/2020 1533 by Kieth Brightly, RN Outcome: Progressing 05/02/2020 1532 by Kieth Brightly, RN Outcome: Progressing   Problem: Activity: Goal: Interest or engagement in activities will improve 05/02/2020 1533 by Kieth Brightly, RN Outcome: Progressing 05/02/2020 1532 by Kieth Brightly, RN Outcome: Progressing Goal: Sleeping patterns will improve 05/02/2020 1533 by Kieth Brightly, RN Outcome: Progressing 05/02/2020 1532 by Kieth Brightly, RN Outcome: Progressing   Problem: Coping: Goal: Ability to verbalize frustrations and anger appropriately will improve 05/02/2020 1533 by Kieth Brightly, RN Outcome: Progressing 05/02/2020 1532 by Kieth Brightly, RN Outcome:  Progressing Goal: Ability to demonstrate self-control will improve 05/02/2020 1533 by Kieth Brightly, RN Outcome: Progressing 05/02/2020 1532 by Kieth Brightly, RN Outcome: Progressing   Problem: Health Behavior/Discharge Planning: Goal: Identification of resources available to assist in meeting health care needs  will improve 05/02/2020 1533 by Kieth Brightly, RN Outcome: Progressing 05/02/2020 1532 by Kieth Brightly, RN Outcome: Progressing Goal: Compliance with treatment plan for underlying cause of condition will improve 05/02/2020 1533 by Kieth Brightly, RN Outcome: Progressing 05/02/2020 1532 by Kieth Brightly, RN Outcome: Progressing   Problem: Physical Regulation: Goal: Ability to maintain clinical measurements within normal limits will improve 05/02/2020 1533 by Kieth Brightly, RN Outcome: Progressing 05/02/2020 1532 by Kieth Brightly, RN Outcome: Progressing   Problem: Safety: Goal: Periods of time without injury will increase 05/02/2020 1533 by Kieth Brightly, RN Outcome: Progressing 05/02/2020 1532 by Kieth Brightly, RN Outcome: Progressing   Problem: Education: Goal: Ability to make informed decisions regarding treatment will improve 05/02/2020 1533 by Kieth Brightly, RN Outcome: Progressing 05/02/2020 1532 by Kieth Brightly, RN Outcome: Progressing   Problem: Coping: Goal: Coping ability will improve 05/02/2020 1533 by Kieth Brightly, RN Outcome: Progressing 05/02/2020 1532 by Kieth Brightly, RN Outcome: Progressing   Problem: Health Behavior/Discharge Planning: Goal: Identification of resources available to assist in meeting health care needs will improve 05/02/2020 1533 by Kieth Brightly, RN Outcome: Progressing 05/02/2020 1532 by Kieth Brightly, RN Outcome: Progressing   Problem: Medication: Goal: Compliance with prescribed medication regimen will improve 05/02/2020 1533 by Kieth Brightly, RN Outcome: Progressing 05/02/2020  1532 by Kieth Brightly, RN Outcome: Progressing   Problem: Self-Concept: Goal: Ability to disclose and discuss suicidal ideas will improve 05/02/2020 1533 by Kieth Brightly, RN Outcome: Progressing 05/02/2020 1532 by Kieth Brightly, RN Outcome: Progressing Goal: Will verbalize positive feelings about self 05/02/2020 1533 by Kieth Brightly, RN Outcome: Progressing 05/02/2020 1532 by Kieth Brightly, RN Outcome: Progressing   Problem: Coping: Goal: Coping ability will improve 05/02/2020 1533 by Kieth Brightly, RN Outcome: Progressing 05/02/2020 1532 by Kieth Brightly, RN Outcome: Progressing Goal: Will verbalize feelings 05/02/2020 1533 by Kieth Brightly, RN Outcome: Progressing 05/02/2020 1532 by Kieth Brightly, RN Outcome: Progressing   Problem: Education: Goal: Utilization of techniques to improve thought processes will improve 05/02/2020 1533 by Kieth Brightly, RN Outcome: Progressing 05/02/2020 1532 by Kieth Brightly, RN Outcome: Progressing Goal: Knowledge of the prescribed therapeutic regimen will improve 05/02/2020 1533 by Kieth Brightly, RN Outcome: Progressing 05/02/2020 1532 by Kieth Brightly, RN Outcome: Progressing   Problem: Activity: Goal: Interest or engagement in leisure activities will improve 05/02/2020 1533 by Kieth Brightly, RN Outcome: Progressing 05/02/2020 1532 by Kieth Brightly, RN Outcome: Progressing Goal: Imbalance in normal sleep/wake cycle will improve 05/02/2020 1533 by Kieth Brightly, RN Outcome: Progressing 05/02/2020 1532 by Kieth Brightly, RN Outcome: Progressing   Problem: Coping: Goal: Coping ability will improve 05/02/2020 1533 by Kieth Brightly, RN Outcome: Progressing 05/02/2020 1532 by Kieth Brightly, RN Outcome: Progressing Goal: Will verbalize feelings 05/02/2020 1533 by Kieth Brightly, RN Outcome: Progressing 05/02/2020 1532 by Kieth Brightly, RN Outcome: Progressing   Problem: Health  Behavior/Discharge Planning: Goal: Ability to make decisions will improve 05/02/2020 1533 by Kieth Brightly, RN Outcome: Progressing 05/02/2020 1532 by Kieth Brightly, RN Outcome: Progressing Goal: Compliance with therapeutic regimen will improve 05/02/2020 1533 by Kieth Brightly, RN Outcome: Progressing 05/02/2020 1532 by Kieth Brightly, RN Outcome: Progressing   Problem: Role Relationship: Goal: Will demonstrate positive changes in social behaviors and relationships 05/02/2020 1533 by Kieth Brightly, RN Outcome: Progressing 05/02/2020 1532 by Kieth Brightly,  RN Outcome: Progressing   Problem: Safety: Goal: Ability to disclose and discuss suicidal ideas will improve 05/02/2020 1533 by Kieth Brightly, RN Outcome: Progressing 05/02/2020 1532 by Kieth Brightly, RN Outcome: Progressing Goal: Ability to identify and utilize support systems that promote safety will improve 05/02/2020 1533 by Kieth Brightly, RN Outcome: Progressing 05/02/2020 1532 by Kieth Brightly, RN Outcome: Progressing   Problem: Self-Concept: Goal: Will verbalize positive feelings about self 05/02/2020 1533 by Kieth Brightly, RN Outcome: Progressing 05/02/2020 1532 by Kieth Brightly, RN Outcome: Progressing Goal: Level of anxiety will decrease 05/02/2020 1533 by Kieth Brightly, RN Outcome: Progressing 05/02/2020 1532 by Kieth Brightly, RN Outcome: Progressing   Problem: Education: Goal: Utilization of techniques to improve thought processes will improve 05/02/2020 1533 by Kieth Brightly, RN Outcome: Progressing 05/02/2020 1532 by Kieth Brightly, RN Outcome: Progressing Goal: Knowledge of the prescribed therapeutic regimen will improve 05/02/2020 1533 by Kieth Brightly, RN Outcome: Progressing 05/02/2020 1532 by Kieth Brightly, RN Outcome: Progressing   Problem: Activity: Goal: Interest or engagement in leisure activities will improve 05/02/2020 1533 by Kieth Brightly,  RN Outcome: Progressing 05/02/2020 1532 by Kieth Brightly, RN Outcome: Progressing Goal: Imbalance in normal sleep/wake cycle will improve 05/02/2020 1533 by Kieth Brightly, RN Outcome: Progressing 05/02/2020 1532 by Kieth Brightly, RN Outcome: Progressing   Problem: Coping: Goal: Coping ability will improve 05/02/2020 1533 by Kieth Brightly, RN Outcome: Progressing 05/02/2020 1532 by Kieth Brightly, RN Outcome: Progressing Goal: Will verbalize feelings 05/02/2020 1533 by Kieth Brightly, RN Outcome: Progressing 05/02/2020 1532 by Kieth Brightly, RN Outcome: Progressing   Problem: Health Behavior/Discharge Planning: Goal: Ability to make decisions will improve 05/02/2020 1533 by Kieth Brightly, RN Outcome: Progressing 05/02/2020 1532 by Kieth Brightly, RN Outcome: Progressing Goal: Compliance with therapeutic regimen will improve 05/02/2020 1533 by Kieth Brightly, RN Outcome: Progressing 05/02/2020 1532 by Kieth Brightly, RN Outcome: Progressing   Problem: Role Relationship: Goal: Will demonstrate positive changes in social behaviors and relationships 05/02/2020 1533 by Kieth Brightly, RN Outcome: Progressing 05/02/2020 1532 by Kieth Brightly, RN Outcome: Progressing   Problem: Safety: Goal: Ability to disclose and discuss suicidal ideas will improve 05/02/2020 1533 by Kieth Brightly, RN Outcome: Progressing 05/02/2020 1532 by Kieth Brightly, RN Outcome: Progressing Goal: Ability to identify and utilize support systems that promote safety will improve 05/02/2020 1533 by Kieth Brightly, RN Outcome: Progressing 05/02/2020 1532 by Kieth Brightly, RN Outcome: Progressing   Problem: Self-Concept: Goal: Will verbalize positive feelings about self 05/02/2020 1533 by Kieth Brightly, RN Outcome: Progressing 05/02/2020 1532 by Kieth Brightly, RN Outcome: Progressing Goal: Level of anxiety will decrease 05/02/2020 1533 by Kieth Brightly,  RN Outcome: Progressing 05/02/2020 1532 by Kieth Brightly, RN Outcome: Progressing

## 2020-05-02 NOTE — Plan of Care (Signed)
Pt denies

## 2020-05-03 LAB — BASIC METABOLIC PANEL
Anion gap: 7 (ref 5–15)
BUN: 13 mg/dL (ref 8–23)
CO2: 29 mmol/L (ref 22–32)
Calcium: 8.6 mg/dL — ABNORMAL LOW (ref 8.9–10.3)
Chloride: 104 mmol/L (ref 98–111)
Creatinine, Ser: 0.81 mg/dL (ref 0.61–1.24)
GFR, Estimated: >60 mL/min (ref >60)
Glucose, Bld: 127 mg/dL — ABNORMAL HIGH (ref 70–99)
Potassium: 3.8 mmol/L (ref 3.5–5.1)
Sodium: 140 mmol/L (ref 135–145)

## 2020-05-03 LAB — CBC WITH DIFFERENTIAL/PLATELET
Abs Immature Granulocytes: 0.01 10*3/uL (ref 0.00–0.07)
Basophils Absolute: 0 10*3/uL (ref 0.0–0.1)
Basophils Relative: 1 %
Eosinophils Absolute: 0.4 10*3/uL (ref 0.0–0.5)
Eosinophils Relative: 10 %
HCT: 37.8 % — ABNORMAL LOW (ref 39.0–52.0)
Hemoglobin: 12.4 g/dL — ABNORMAL LOW (ref 13.0–17.0)
Immature Granulocytes: 0 %
Lymphocytes Relative: 22 %
Lymphs Abs: 0.9 10*3/uL (ref 0.7–4.0)
MCH: 31.6 pg (ref 26.0–34.0)
MCHC: 32.8 g/dL (ref 30.0–36.0)
MCV: 96.4 fL (ref 80.0–100.0)
Monocytes Absolute: 0.7 10*3/uL (ref 0.1–1.0)
Monocytes Relative: 16 %
Neutro Abs: 2.2 10*3/uL (ref 1.7–7.7)
Neutrophils Relative %: 51 %
Platelets: 141 10*3/uL — ABNORMAL LOW (ref 150–400)
RBC: 3.92 MIL/uL — ABNORMAL LOW (ref 4.22–5.81)
RDW: 11.9 % (ref 11.5–15.5)
WBC: 4.2 10*3/uL (ref 4.0–10.5)
nRBC: 0 % (ref 0.0–0.2)

## 2020-05-03 NOTE — [facility] Group Notes (Signed)
[facility] Group Notes: (Clinical Social Work)   05/03/2020      Type of Therapy:  Group Therapy   Participation Level:  Did Not Attend - was invited individually by Nurse/MHT and chose not to attend.   [name], LCSWA 05/03/2020  10:26 AM

## 2020-05-03 NOTE — Progress Notes (Signed)
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 Denies SI, HI and AVH

## 2020-05-03 NOTE — Plan of Care (Signed)
  Problem: Education: Goal: Knowledge of [facility] Education information/materials will improve Outcome: Progressing Goal: Emotional status will improve Outcome: Progressing Goal: Mental status will improve Outcome: Progressing Goal: Verbalization of understanding the information provided will improve Outcome: Progressing   

## 2020-05-03 NOTE — Progress Notes (Signed)
D: Pt alert and oriented. Pt rates depression 0/10, hopelessness 0/10, and anxiety 0/10. Pt goal: "Behave, stay calm, coping with being here, discharge." Pt reports energy level as normal. Pt reports sleep last night as being good. Pt did not receive medications for sleep. Pt denies experiencing any pain at this time. Pt denies experiencing any SI/HI, or AVH at this time.   Pt had an incontinent episode this afternoon, was able to clean self up and went back to lunch. Pt is still anxious about placement being found for him. Pt's brother also called this afternoon to check on pt. Pt's brother asked if the pt had any yelling episodes and how his anxiety was. Pt's brother was informed that he was doing well today although he was experiencing some anxiety r/t finding placement. Pt's brother lastly asked if the pt was drawing and was informed that he was indeed coloring at the time.   A: Scheduled medications administered to pt, per MD orders. Support and encouragement provided. Frequent verbal contact made. Routine safety checks conducted q15 minutes.   R: No adverse drug reactions noted. Pt verbally contracts for safety at this time. Pt complaint with medications and treatment plan. Pt interacts well with others on the unit. Pt remains safe at this time. Will continue to monitor.

## 2020-05-03 NOTE — Progress Notes (Signed)
[facility] MD Progress Note  05/03/2020 9:43 AM Alexander Beard  MRN:  [id number]   Subjective:   10/24 Patient was seen lying in bed this morning, tells me that he is feeling weak.  Says that he does not want to go outside.  Indicates that he did sleep well last night.  He is not able to tell me if he is feeling feverish or having any physical complaints.  Fixates on one to make some phone calls and they being private from staff and nursing.  No hallucinations or delusions.  10/23 Patient is mild-mannered today, interviewed in his room today.  He is starts off by expressing his frustrations about not being accepted to any group homes.  Says that he wants to reach out to the Mayor's committee and also [facility] to see if they can help.  Points out that when he makes these phone calls, wants them to be private and not have the nurses or staff listening.  Also frustrated about the restrictions on the unit such as not being able to stay by himself.  Says that he has some TV shows that he wants to watch which he cannot while here.  He does enjoy going to [employer] and interacting with others.  Sleeping well.  Denies hallucinations.  Denies paranoia.  Principal Problem: Dementia ([place]) Diagnosis: Principal Problem:   Dementia ([place]) Active Problems:   Schizoaffective disorder, bipolar type ([place])   Active autistic disorder   Diabetes ([place])   Hypertension   Prostate hypertrophy   Intellectual disability   At risk for elopement  Total Time spent with patient: 30 minutes  Past Psychiatric History: Past history of longstanding impairment see many previous notes  Past Medical History:  Past Medical History:  Diagnosis Date  . Anxiety   . Anxiety disorder due to known physiological condition    HOSPITALIZED 10/18  . Arthritis   . Autistic disorder, residual state   . COPD (chronic obstructive pulmonary disease) ([place])   . Depression   . Developmental disorder   . Diabetes mellitus without complication  ([place])   . Dyslipidemia   . Esophageal reflux   . HOH (hard of hearing)    MILDLY  . Hypertension   . Obesity   . Overactive bladder   . Palpitations    ANXIETY  . Schizophrenia, schizoaffective ([place])   . Sleep apnea   . Tremors of nervous system    HANDS DUE TO MEDICATIONS  . Urinary incontinence     Past Surgical History:  Procedure Laterality Date  . CATARACT EXTRACTION W/PHACO Left 11/14/2017   Procedure: CATARACT EXTRACTION PHACO AND INTRAOCULAR LENS PLACEMENT (IOC);  Surgeon: Birder Robson, MD;  Location: [facility] ORS;  Service: Ophthalmology;  Laterality: Left;  [place] 00:42 AP% 14.6 CDE 6.12 Fluid pack lot # [id number]  . COLONOSCOPY  01/28/2005  . COLONOSCOPY WITH PROPOFOL N/A 05/09/2016   Procedure: COLONOSCOPY WITH PROPOFOL;  Surgeon: Manya Silvas, MD;  Location: [facility] ENDOSCOPY;  Service: Endoscopy;  Laterality: N/A;  . ESOPHAGOGASTRODUODENOSCOPY N/A 05/09/2016   Procedure: ESOPHAGOGASTRODUODENOSCOPY (EGD);  Surgeon: Manya Silvas, MD;  Location: [facility] ENDOSCOPY;  Service: Endoscopy;  Laterality: N/A;  . FRACTURE SURGERY     ORIF SHOULDER  . TONSILLECTOMY     Family History:  Family History  Problem Relation Age of Onset  . Hypertension Mother   . Stroke Father   . Heart Problems Father    Family Psychiatric  History: see previous Social History:  Social History  Substance and Sexual Activity  Alcohol Use No  . Alcohol/week: 0.0 standard drinks     Social History   Substance and Sexual Activity  Drug Use No    Social History   Socioeconomic History  . Marital status: Single    Spouse name: Not on file  . Number of children: Not on file  . Years of education: Not on file  . Highest education level: Not on file  Occupational History  . Not on file  Tobacco Use  . Smoking status: Never Smoker  . Smokeless tobacco: Never Used  Vaping Use  . Vaping Use: Never used  Substance and Sexual Activity  . Alcohol use: No    Alcohol/week: 0.0 standard  drinks  . Drug use: No  . Sexual activity: Never  Other Topics Concern  . Not on file  Social History Narrative   The patient never finished high school but did get his GED. 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 He is currently in disability and [name] is his legal guardian. He has been living in group homes for many years.      No pending legal charges   Social Determinants of Health   Financial Resource Strain:   . Difficulty of Paying Living Expenses: Not on file  Food Insecurity:   . Worried About Charity fundraiser in the Last Year: Not on file  . Ran Out of Food in the Last Year: Not on file  Transportation Needs:   . Lack of Transportation (Medical): Not on file  . Lack of Transportation (Non-Medical): Not on file  Physical Activity:   . Days of Exercise per Week: Not on file  . Minutes of Exercise per Session: Not on file  Stress:   . Feeling of Stress : Not on file  Social Connections:   . Frequency of Communication with Friends and Family: Not on file  . Frequency of Social Gatherings with Friends and Family: Not on file  . Attends Religious Services: Not on file  . Active Member of Clubs or Organizations: Not on file  . Attends Archivist Meetings: Not on file  . Marital Status: Not on file   Additional Social History:                         Sleep: Good  Appetite:  Good  Current Medications: Current Facility-Administered Medications  Medication Dose Route Frequency Provider Last Rate Last Admin  . acetaminophen (TYLENOL) tablet 650 mg  650 mg Oral Q6H PRN [name], MD      . alum & mag hydroxide-simeth (MAALOX/MYLANTA) 200-200-20 MG/5ML suspension 30 mL  30 mL Oral Q4H PRN [name], MD      . aspirin EC tablet 81 mg  81 mg Oral Daily [name], [name], MD   81 mg at 05/03/20 0825  . clonazePAM (KLONOPIN) tablet 1 mg  1 mg Oral QID [name], [name], MD    1 mg at 05/03/20 0825  . divalproex (DEPAKOTE ER) 24 hr tablet 1,500 mg  1,500 mg Oral QHS [name], [name], MD   1,500 mg at 05/02/20 2117  . gabapentin (NEURONTIN) capsule 100 mg  100 mg Oral TID [name], [name], MD   100 mg at 05/03/20 0825  . hydrOXYzine (ATARAX/VISTARIL) tablet 10 mg  10 mg Oral TID PRN [name], MD  10 mg at 04/20/20 2128  . influenza vac split quadrivalent PF (FLUARIX) injection 0.5 mL  0.5 mL Intramuscular Tomorrow-1000 [name], [name], MD      . lamoTRIgine (LAMICTAL) tablet 100 mg  100 mg Oral QHS [name], [name], MD   100 mg at 05/02/20 2117  . linagliptin (TRADJENTA) tablet 5 mg  5 mg Oral Daily [name], [name], MD   5 mg at 05/03/20 0825  . loperamide (IMODIUM) capsule 2 mg  2 mg Oral Q4H PRN [name], MD   2 mg at 04/10/20 0917  . loratadine (CLARITIN) tablet 10 mg  10 mg Oral Daily [name], [name], MD   10 mg at 05/03/20 0825  . LORazepam (ATIVAN) tablet 2 mg  2 mg Oral Q6H PRN [name], [name], MD   2 mg at 05/02/20 1211  . magnesium hydroxide (MILK OF MAGNESIA) suspension 30 mL  30 mL Oral Daily PRN [name], MD   30 mL at 03/23/20 1344  . metFORMIN (GLUCOPHAGE) tablet 850 mg  850 mg Oral BID WC [name], [name], MD   850 mg at 05/03/20 0826  . metoprolol succinate (TOPROL-XL) 24 hr tablet 50 mg  50 mg Oral Daily [name], [name], MD   50 mg at 05/03/20 0826  . ondansetron (ZOFRAN) tablet 4 mg  4 mg Oral Q8H PRN [name], [name], MD   4 mg at 04/30/20 2035  . oxybutynin (DITROPAN) tablet 10 mg  10 mg Oral BID [name], [name], MD   10 mg at 05/03/20 0826  . pantoprazole (PROTONIX) EC tablet 40 mg  40 mg Oral Daily [name], [name], MD   40 mg at 05/03/20 0825  . prazosin (MINIPRESS) capsule 1 mg  1 mg Oral QHS [name], [name], MD   1 mg at 05/01/20 2131  . QUEtiapine (SEROQUEL) tablet 100 mg  100 mg Oral QID [name], [name], MD   100 mg at 05/03/20 0825  . QUEtiapine (SEROQUEL) tablet 400 mg  400 mg Oral QHS [name], NP   400 mg at  05/02/20 2117  . sertraline (ZOLOFT) tablet 50 mg  50 mg Oral BID [name], [name], MD   50 mg at 05/03/20 0825  . simethicone (MYLICON) chewable tablet 80 mg  80 mg Oral TID [name], [name], MD   80 mg at 05/03/20 0825  . simvastatin (ZOCOR) tablet 20 mg  20 mg Oral q1800 [name], [name], MD   20 mg at 05/02/20 1709  . tamsulosin (FLOMAX) capsule 0.8 mg  0.8 mg Oral QPC supper [name], [name], MD   0.8 mg at 05/02/20 1710    Lab Results: No results found for this or any previous visit (from the past 48 hour(s)).  Blood Alcohol level:  Lab Results  Component Value Date   ETH <10 01/22/2020   ETH <10 70/26/3785    Metabolic Disorder Labs: Lab Results  Component Value Date   HGBA1C 6.6 (H) 02/20/2020   MPG 142.72 02/20/2020   No results found for: PROLACTIN Lab Results  Component Value Date   CHOL 153 04/10/2015   TRIG 166 (H) 04/10/2015   HDL 50 04/10/2015   CHOLHDL 3.1 04/10/2015   VLDL 33 04/10/2015   LDLCALC 70 04/10/2015    Physical Findings: AIMS: Facial and Oral Movements Muscles of Facial Expression: None, normal Lips and Perioral Area: None, normal Jaw: None, normal Tongue: None, normal,Extremity Movements Upper (arms, wrists, hands, fingers): None, normal Lower (legs,  knees, ankles, toes): None, normal, Trunk Movements Neck, shoulders, hips: None, normal, Overall Severity Severity of abnormal movements (highest score from questions above): None, normal Incapacitation due to abnormal movements: None, normal Patient's awareness of abnormal movements (rate only patient's report): No Awareness, Dental Status Current problems with teeth and/or dentures?: No Does patient usually wear dentures?: No  CIWA:    COWS:     Musculoskeletal: Strength & Muscle Tone: within normal limits Gait & Station: normal Patient leans: N/A  Psychiatric Specialty Exam: Physical Exam Vitals and nursing note reviewed.  Constitutional:      Appearance: Normal appearance.  HENT:      Head: Normocephalic and atraumatic.     Right Ear: External ear normal.     Left Ear: External ear normal.     Nose: Nose normal.     Mouth/Throat:     Mouth: Mucous membranes are moist.     Pharynx: Oropharynx is clear.  Eyes:     Extraocular Movements: Extraocular movements intact.     Conjunctiva/sclera: Conjunctivae normal.     Pupils: Pupils are equal, round, and reactive to light.  Cardiovascular:     Rate and Rhythm: Normal rate.     Pulses: Normal pulses.  Pulmonary:     Effort: Pulmonary effort is normal.     Breath sounds: Normal breath sounds.  Abdominal:     General: Abdomen is flat.     Palpations: Abdomen is soft.  Musculoskeletal:        General: No swelling. Normal range of motion.     Cervical back: Normal range of motion and neck supple.  Skin:    General: Skin is warm and dry.  Neurological:     General: No focal deficit present.     Mental Status: He is alert. Mental status is at baseline.  Psychiatric:        Mood and Affect: Mood normal.        Behavior: Behavior normal.        Thought Content: Thought content normal.        Judgment: Judgment normal.     Review of Systems  Constitutional: Negative for appetite change and fatigue.  HENT: Negative for rhinorrhea and sore throat.   Eyes: Negative for photophobia and visual disturbance.  Respiratory: Negative for cough and shortness of breath.   Cardiovascular: Negative for chest pain and palpitations.  Gastrointestinal: Negative for constipation, diarrhea, nausea and vomiting.  Endocrine: Negative for cold intolerance and heat intolerance.  Genitourinary: Negative for difficulty urinating and dysuria.  Musculoskeletal: Negative for back pain and neck stiffness.  Skin: Negative for rash and wound.  Allergic/Immunologic: Negative for food allergies and immunocompromised state.  Neurological: Negative for dizziness and headaches.  Hematological: Negative for adenopathy. Does not bruise/bleed easily.   Psychiatric/Behavioral: Negative for hallucinations and suicidal ideas.    Blood pressure (!) 151/102, pulse 66, temperature 97.7 F (36.5 C), temperature source Oral, resp. rate 16, height 5\' 8"  (1.727 m), weight 104.3 kg, SpO2 98 %.Body mass index is 34.96 kg/m.  General Appearance: Casual  Eye Contact: Eyes closed  Speech:  Clear and Coherent  Volume:  Normal  Mood: "Feeling down."  Affect:  Congruent  Thought Process:  Coherent  Orientation:  Full (Time, Place, and Person)  Thought Content:  Logical  Suicidal Thoughts:  No  Homicidal Thoughts:  No  Memory:  Immediate;   Fair Recent;   Fair Remote;   Fair  Judgement:  Fair  Insight:  Fair  Psychomotor Activity:  Normal  Concentration:  Concentration: Fair and Attention Span: Fair  Recall:  [employer] of Knowledge:  Fair  Language:  Fair  Akathisia:  Negative  Handed:  Right  AIMS (if indicated):     Assets:  Communication Skills Desire for Improvement Resilience Social Support  ADL's:  Intact  Cognition:  WNL  Sleep:  Number of Hours: 8     Treatment Plan Summary: Daily contact with patient to assess and evaluate symptoms and progress in treatment and Medication management. Plan: no changes to current medications. Patient is at baseline, but requires placement.  05/03/20 Obtain CBC and BMP  05/02/20 No changes  [name], MD 05/03/2020, 9:43 AM

## 2020-05-04 DIAGNOSIS — F0391 Unspecified dementia with behavioral disturbance: Secondary | ICD-10-CM | POA: Diagnosis not present

## 2020-05-04 NOTE — BHH Counselor (Signed)
CSW contacted Alexander Beard, [phone], she reports no updates at this time.   Assunta Curtis, MSW, LCSW 05/04/2020 3:50 PM

## 2020-05-04 NOTE — Plan of Care (Signed)
Problem: Education: Goal: Knowledge of [facility] Education information/materials will improve Outcome: Progressing Goal: Emotional status will improve Outcome: Progressing Goal: Mental status will improve Outcome: Progressing Goal: Verbalization of understanding the information provided will improve Outcome: Progressing   Problem: Activity: Goal: Interest or engagement in activities will improve Outcome: Progressing Goal: Sleeping patterns will improve Outcome: Progressing   Problem: Coping: Goal: Ability to verbalize frustrations and anger appropriately will improve Outcome: Progressing Goal: Ability to demonstrate self-control will improve Outcome: Progressing   Problem: Health Behavior/Discharge Planning: Goal: Identification of resources available to assist in meeting health care needs will improve Outcome: Progressing Goal: Compliance with treatment plan for underlying cause of condition will improve Outcome: Progressing   Problem: Physical Regulation: Goal: Ability to maintain clinical measurements within normal limits will improve Outcome: Progressing   Problem: Safety: Goal: Periods of time without injury will increase Outcome: Progressing   Problem: Education: Goal: Ability to make informed decisions regarding treatment will improve Outcome: Progressing   Problem: Coping: Goal: Coping ability will improve Outcome: Progressing   Problem: Health Behavior/Discharge Planning: Goal: Identification of resources available to assist in meeting health care needs will improve Outcome: Progressing   Problem: Medication: Goal: Compliance with prescribed medication regimen will improve Outcome: Progressing   Problem: Self-Concept: Goal: Ability to disclose and discuss suicidal ideas will improve Outcome: Progressing Goal: Will verbalize positive feelings about self Outcome: Progressing   Problem: Coping: Goal: Coping ability will improve Outcome:  Progressing Goal: Will verbalize feelings Outcome: Progressing   Problem: Education: Goal: Utilization of techniques to improve thought processes will improve Outcome: Progressing Goal: Knowledge of the prescribed therapeutic regimen will improve Outcome: Progressing   Problem: Activity: Goal: Interest or engagement in leisure activities will improve Outcome: Progressing Goal: Imbalance in normal sleep/wake cycle will improve Outcome: Progressing   Problem: Coping: Goal: Coping ability will improve Outcome: Progressing Goal: Will verbalize feelings Outcome: Progressing   Problem: Health Behavior/Discharge Planning: Goal: Ability to make decisions will improve Outcome: Progressing Goal: Compliance with therapeutic regimen will improve Outcome: Progressing   Problem: Role Relationship: Goal: Will demonstrate positive changes in social behaviors and relationships Outcome: Progressing   Problem: Safety: Goal: Ability to disclose and discuss suicidal ideas will improve Outcome: Progressing Goal: Ability to identify and utilize support systems that promote safety will improve Outcome: Progressing   Problem: Self-Concept: Goal: Will verbalize positive feelings about self Outcome: Progressing Goal: Level of anxiety will decrease Outcome: Progressing   Problem: Education: Goal: Knowledge of [facility] Education information/materials will improve Outcome: Progressing Goal: Emotional status will improve Outcome: Progressing Goal: Mental status will improve Outcome: Progressing Goal: Verbalization of understanding the information provided will improve Outcome: Progressing   Problem: Activity: Goal: Interest or engagement in activities will improve Outcome: Progressing Goal: Sleeping patterns will improve Outcome: Progressing   Problem: Coping: Goal: Ability to verbalize frustrations and anger appropriately will improve Outcome: Progressing Goal: Ability to  demonstrate self-control will improve Outcome: Progressing   Problem: Health Behavior/Discharge Planning: Goal: Identification of resources available to assist in meeting health care needs will improve Outcome: Progressing Goal: Compliance with treatment plan for underlying cause of condition will improve Outcome: Progressing   Problem: Physical Regulation: Goal: Ability to maintain clinical measurements within normal limits will improve Outcome: Progressing   Problem: Safety: Goal: Periods of time without injury will increase Outcome: Progressing   Problem: Education: Goal: Utilization of techniques to improve thought processes will improve Outcome: Progressing Goal: Knowledge of the prescribed therapeutic regimen  will improve Outcome: Progressing   Problem: Activity: Goal: Interest or engagement in leisure activities will improve Outcome: Progressing Goal: Imbalance in normal sleep/wake cycle will improve Outcome: Progressing   Problem: Coping: Goal: Coping ability will improve Outcome: Progressing Goal: Will verbalize feelings Outcome: Progressing   Problem: Health Behavior/Discharge Planning: Goal: Ability to make decisions will improve Outcome: Progressing Goal: Compliance with therapeutic regimen will improve Outcome: Progressing   Problem: Role Relationship: Goal: Will demonstrate positive changes in social behaviors and relationships Outcome: Progressing   Problem: Safety: Goal: Ability to disclose and discuss suicidal ideas will improve Outcome: Progressing Goal: Ability to identify and utilize support systems that promote safety will improve Outcome: Progressing   Problem: Self-Concept: Goal: Will verbalize positive feelings about self Outcome: Progressing Goal: Level of anxiety will decrease Outcome: Progressing

## 2020-05-04 NOTE — Progress Notes (Signed)
Patient has been pleasant and cooperative. Denies SI HI and AVH 

## 2020-05-04 NOTE — Progress Notes (Signed)
D- Patient alert and oriented. Affect/mood is calm, cooperative and content. Pt denies SI, HI, AVH, and pain. Pt stated that he had a good day. No complaints.   A- Scheduled medications administered to patient, per MD orders. Support and encouragement provided.  Routine safety checks conducted every 15 minutes.  Patient informed to notify staff with problems or concerns.  R- No adverse drug reactions noted. Patient contracts for safety at this time. Patient compliant with medications and treatment plan. Patient receptive, calm, and cooperative. Patient interacts well with others on the unit.  Patient remains safe at this time.  Collier Bullock RN

## 2020-05-04 NOTE — Plan of Care (Signed)
  Problem: Anxiety Goal: STG - Patient will identify 3 triggers to anxiety within 5 recreation therapy group sessions Description: STG - Patient will identify 3 triggers to anxiety within 5 recreation therapy group sessions Outcome: Progressing Goal: STG - Patient will identify 3 benefits of managing their anxiety in a healthy manner within 5 recreation therapy group sessions Description: STG - Patient will identify 3 benefits of managing their anxiety in a healthy manner within 5 recreation therapy group sessions Outcome: Progressing Goal: STG - Patient will demonstrate ability to practice at least 2 stress management technique independently post d/c within 5 recreation therapy group sessions Description: STG - Patient will demonstrate ability to practice at least 2 stress management technique independently post d/c within 5 recreation therapy group sessions Outcome: Progressing

## 2020-05-04 NOTE — [facility] Group Notes (Signed)
LCSW Group Therapy Note   05/04/2020 2:12 PM  Type of Therapy and Topic:  Group Therapy:  Overcoming Obstacles   Participation Level:  Active   Description of Group:    In this group patients will be encouraged to explore what they see as obstacles to their own wellness and recovery. They will be guided to discuss their thoughts, feelings, and behaviors related to these obstacles. The group will process together ways to cope with barriers, with attention given to specific choices patients can make. Each patient will be challenged to identify changes they are motivated to make in order to overcome their obstacles. This group will be process-oriented, with patients participating in exploration of their own experiences as well as giving and receiving support and challenge from other group members.   Therapeutic Goals: 1. Patient will identify personal and current obstacles as they relate to admission. 2. Patient will identify barriers that currently interfere with their wellness or overcoming obstacles.  3. Patient will identify feelings, thought process and behaviors related to these barriers. 4. Patient will identify two changes they are willing to make to overcome these obstacles:      Summary of Patient Progress Patient participated in the group process. He was able to identify housing and lack of activities as obstacles related to admission. He spoke about volunteering and listed some places that he would like to help out around. His comments were pertinent to the conversation.      Therapeutic Modalities:   Cognitive Behavioral Therapy Solution Focused Therapy Motivational Interviewing Relapse Prevention Therapy  Hedy Camara R. [name], MSW, [place], [place] 05/04/2020 2:12 PM

## 2020-05-04 NOTE — Plan of Care (Signed)
Pt denies depression, anxiety, SI, HI and AVH. Pt was educated on care plan and verbalizes understanding. Collier Bullock RN Problem: Coping: Goal: Ability to identify and develop effective coping behavior will improve Outcome: Progressing   Problem: Education: Goal: Ability to state activities that reduce stress will improve Outcome: Progressing   Problem: Coping: Goal: Ability to identify and develop effective coping behavior will improve Outcome: Progressing   Problem: Self-Concept: Goal: Ability to identify factors that promote anxiety will improve Outcome: Progressing Goal: Level of anxiety will decrease Outcome: Progressing Goal: Ability to modify response to factors that promote anxiety will improve Outcome: Progressing   Problem: Education: Goal: Ability to state activities that reduce stress will improve Outcome: Progressing   Problem: Coping: Goal: Ability to identify and develop effective coping behavior will improve Outcome: Progressing   Problem: Self-Concept: Goal: Ability to identify factors that promote anxiety will improve Outcome: Progressing Goal: Level of anxiety will decrease Outcome: Progressing Goal: Ability to modify response to factors that promote anxiety will improve Outcome: Progressing

## 2020-05-04 NOTE — Progress Notes (Signed)
Recreation Therapy Notes  Date: 05/04/2020  Time: 9:30 am    Location: Craft room   Behavioral response: Appropriate  Intervention Topic: Relaxation  Discussion/Intervention:  Group content today was focused on relaxation. The group defined relaxation and identified healthy ways to relax. Individuals expressed how much time they spend relaxing. Patients expressed how much their life would be if they did not make time for themselves to relax. The group stated ways they could improve their relaxation techniques in the future.  Individuals participated in the intervention "Time to Relax" where they had a chance to experience different relaxation techniques.   Clinical Observations/Feedback: Patient came to group and defined relaxation as being calm and taking breaks. He stated that he can relax more by not being glued to onething so long. Participant identified his relaxation techniques as doing art work, exercising and watching the weather. Patient stated that relaxation helps him stay alert. Individual was social with peers and staff while participating in the intervention.  Ranyia Witting LRT/CTRS         Laverne Hursey 05/04/2020 11:47 AM

## 2020-05-04 NOTE — Progress Notes (Signed)
°   05/04/20 0235  Clinical Encounter Type  Visited With Patient  Visit Type Follow-up;Spiritual support;Social support;Behavioral Health  Referral From Chaplain  Consult/Referral To Chaplain     05/04/20 0235  Clinical Encounter Type  Visited With Patient  Visit Type Follow-up;Spiritual support;Social support;Behavioral Health  Referral From Chaplain  Consult/Referral To Chaplain  Follow up with Pt. Pt said he had some good news that he received for the Dr. Abbott Pao show me a paper with some programs on it that he is trying to get into. Pt said he is ready to get out of here. Ch gave Pt some encouraging words and a listening ear. Ch will follow-up with Pt.

## 2020-05-04 NOTE — Progress Notes (Signed)
[facility] MD Progress Note  05/04/2020 12:21 PM Alexander Beard  MRN:  [id number]   Subjective:  Alexander Beard seen one-on-one in office.  He had no behavioral issues overnight or today. He denies any fatigue reported over the weekend. He denies suicidal ideations, homicidal ideations, visual hallucinations, or auditory hallucinations.  He notes that he is upset that no one will accept him to a group home based on past behavior of running away. He feels he has been able to learn how to cope with his panic attacks well enough to not do this in the future. He continues to focus on volunteering in the community.   Principal Problem: Dementia ([place]) Diagnosis: Principal Problem:   Dementia ([place]) Active Problems:   Schizoaffective disorder, bipolar type ([place])   Active autistic disorder   Diabetes ([place])   Hypertension   Prostate hypertrophy   Intellectual disability   At risk for elopement  Total Time spent with patient: 30 minutes  Past Psychiatric History: Past history of longstanding impairment see many previous notes  Past Medical History:  Past Medical History:  Diagnosis Date  . Anxiety   . Anxiety disorder due to known physiological condition    HOSPITALIZED 10/18  . Arthritis   . Autistic disorder, residual state   . COPD (chronic obstructive pulmonary disease) ([place])   . Depression   . Developmental disorder   . Diabetes mellitus without complication ([place])   . Dyslipidemia   . Esophageal reflux   . HOH (hard of hearing)    MILDLY  . Hypertension   . Obesity   . Overactive bladder   . Palpitations    ANXIETY  . Schizophrenia, schizoaffective ([place])   . Sleep apnea   . Tremors of nervous system    HANDS DUE TO MEDICATIONS  . Urinary incontinence     Past Surgical History:  Procedure Laterality Date  . CATARACT EXTRACTION W/PHACO Left 11/14/2017   Procedure: CATARACT EXTRACTION PHACO AND INTRAOCULAR LENS PLACEMENT (IOC);  Surgeon: Birder Robson, MD;  Location: [facility] ORS;   Service: Ophthalmology;  Laterality: Left;  [place] 00:42 AP% 14.6 CDE 6.12 Fluid pack lot # [id number]  . COLONOSCOPY  01/28/2005  . COLONOSCOPY WITH PROPOFOL N/A 05/09/2016   Procedure: COLONOSCOPY WITH PROPOFOL;  Surgeon: Manya Silvas, MD;  Location[facility] ENDOSCOPY;  Service: Endoscopy;  Laterality: N/A;  . ESOPHAGOGASTRODUODENOSCOPY N/A 05/09/2016   Procedure: ESOPHAGOGASTRODUODENOSCOPY (EGD);  Surgeon: Manya Silvas, MD;  Location: [facility]COPY;  Service: Endoscopy;  Laterality: N/A;  . FRACTURE SURGERY     ORIF SHOULDER  . TONSILLECTOMY     Family History:  Family History  Problem Relation Age of Onset  . Hypertension Mother   . Stroke Father   . Heart Problems Father    Family Psychiatric  History: see previous Social History:  Social History   Substance and Sexual Activity  Alcohol Use No  . Alcohol/week: 0.0 standard drinks     Social History   Substance and Sexual Activity  Drug Use No    Social History   Socioeconomic History  . Marital status: Single    Spouse name: Not on file  . Number of children: Not on file  . Years of education: Not on file  . Highest education level: Not on file  Occupational History  . Not on file  Tobacco Use  . Smoking status: Never Smoker  . Smokeless tobacco: Never Used  Vaping Use  . Vaping Use: Never used  Substance and Sexual  Activity  . Alcohol use: No    Alcohol/week: 0.0 standard drinks  . Drug use: No  . Sexual activity: Never  Other Topics Concern  . Not on file  Social History Narrative   The patient never finished high school but did get his GED. 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 He is currently in disability and [name] is his legal guardian. He has been living in group homes for many years.      No pending legal charges   Social Determinants of Health   Financial Resource Strain:   . Difficulty of Paying Living Expenses:  Not on file  Food Insecurity:   . Worried About Charity fundraiser in the Last Year: Not on file  . Ran Out of Food in the Last Year: Not on file  Transportation Needs:   . Lack of Transportation (Medical): Not on file  . Lack of Transportation (Non-Medical): Not on file  Physical Activity:   . Days of Exercise per Week: Not on file  . Minutes of Exercise per Session: Not on file  Stress:   . Feeling of Stress : Not on file  Social Connections:   . Frequency of Communication with Friends and Family: Not on file  . Frequency of Social Gatherings with Friends and Family: Not on file  . Attends Religious Services: Not on file  . Active Member of Clubs or Organizations: Not on file  . Attends Archivist Meetings: Not on file  . Marital Status: Not on file   Additional Social History:                         Sleep: Good  Appetite:  Good  Current Medications: Current Facility-Administered Medications  Medication Dose Route Frequency Provider Last Rate Last Admin  . acetaminophen (TYLENOL) tablet 650 mg  650 mg Oral Q6H PRN [name], MD      . alum & mag hydroxide-simeth (MAALOX/MYLANTA) 200-200-20 MG/5ML suspension 30 mL  30 mL Oral Q4H PRN [name], MD      . aspirin EC tablet 81 mg  81 mg Oral Daily [name], [name], MD   81 mg at 05/04/20 0810  . clonazePAM (KLONOPIN) tablet 1 mg  1 mg Oral QID [name], [name], MD   1 mg at 05/04/20 1209  . divalproex (DEPAKOTE ER) 24 hr tablet 1,500 mg  1,500 mg Oral QHS [name], [name], MD   1,500 mg at 05/03/20 2116  . gabapentin (NEURONTIN) capsule 100 mg  100 mg Oral TID [name], [name], MD   100 mg at 05/04/20 1209  . hydrOXYzine (ATARAX/VISTARIL) tablet 10 mg  10 mg Oral TID PRN [name], MD   10 mg at 04/20/20 2128  . influenza vac split quadrivalent PF (FLUARIX) injection 0.5 mL  0.5 mL Intramuscular Tomorrow-1000 [name], [name], MD      . lamoTRIgine (LAMICTAL) tablet 100 mg  100 mg Oral QHS  [name], [name], MD   100 mg at 05/03/20 2116  . linagliptin (TRADJENTA) tablet 5 mg  5 mg Oral Daily [name], [name], MD   5 mg at 05/04/20 0811  . loperamide (IMODIUM) capsule 2 mg  2 mg Oral Q4H PRN [name], MD   2 mg at 04/10/20 0917  . loratadine (CLARITIN) tablet 10 mg  10 mg Oral Daily [name], John  T, MD   10 mg at 05/04/20 0811  . LORazepam (ATIVAN) tablet 2 mg  2 mg Oral Q6H PRN [name], [name], MD   2 mg at 05/02/20 1211  . magnesium hydroxide (MILK OF MAGNESIA) suspension 30 mL  30 mL Oral Daily PRN [name], MD   30 mL at 03/23/20 1344  . metFORMIN (GLUCOPHAGE) tablet 850 mg  850 mg Oral BID WC [name], [name], MD   850 mg at 05/04/20 6213  . metoprolol succinate (TOPROL-XL) 24 hr tablet 50 mg  50 mg Oral Daily [name], [name], MD   50 mg at 05/03/20 0826  . ondansetron (ZOFRAN) tablet 4 mg  4 mg Oral Q8H PRN [name], [name], MD   4 mg at 04/30/20 2035  . oxybutynin (DITROPAN) tablet 10 mg  10 mg Oral BID [name], [name], MD   10 mg at 05/04/20 0813  . pantoprazole (PROTONIX) EC tablet 40 mg  40 mg Oral Daily [name], [name], MD   40 mg at 05/04/20 0865  . prazosin (MINIPRESS) capsule 1 mg  1 mg Oral QHS [name], [name], MD   1 mg at 05/03/20 2117  . QUEtiapine (SEROQUEL) tablet 100 mg  100 mg Oral QID [name], [name], MD   100 mg at 05/04/20 0811  . QUEtiapine (SEROQUEL) tablet 400 mg  400 mg Oral QHS [name], NP   400 mg at 05/03/20 2116  . sertraline (ZOLOFT) tablet 50 mg  50 mg Oral BID [name], [name], MD   50 mg at 05/04/20 0811  . simethicone (MYLICON) chewable tablet 80 mg  80 mg Oral TID [name], [name], MD   80 mg at 05/04/20 1209  . simvastatin (ZOCOR) tablet 20 mg  20 mg Oral q1800 [name], [name], MD   20 mg at 05/03/20 1708  . tamsulosin (FLOMAX) capsule 0.8 mg  0.8 mg Oral QPC supper [name], [name], MD   0.8 mg at 05/03/20 1705    Lab Results:  Results for orders placed or performed during the hospital encounter of 02/19/20 (from the past 48  hour(s))  CBC with Differential/Platelet     Status: Abnormal   Collection Time: 05/03/20 10:10 AM  Result Value Ref Range   WBC 4.2 4.0 - 10.5 K/uL   RBC 3.92 (L) 4.22 - 5.81 MIL/uL   Hemoglobin 12.4 (L) 13.0 - 17.0 g/dL   HCT 37.8 (L) 39 - 52 %   MCV 96.4 80.0 - 100.0 fL   MCH 31.6 26.0 - 34.0 pg   MCHC 32.8 30.0 - 36.0 g/dL   RDW 11.9 11.5 - 15.5 %   Platelets 141 (L) 150 - 400 K/uL   nRBC 0.0 0.0 - 0.2 %   Neutrophils Relative % 51 %   Neutro Abs 2.2 1.7 - 7.7 K/uL   Lymphocytes Relative 22 %   Lymphs Abs 0.9 0.7 - 4.0 K/uL   Monocytes Relative 16 %   Monocytes Absolute 0.7 0.1 - 1.0 K/uL   Eosinophils Relative 10 %   Eosinophils Absolute 0.4 0.0 - 0.5 K/uL   Basophils Relative 1 %   Basophils Absolute 0.0 0.0 - 0.1 K/uL   Immature Granulocytes 0 %   Abs Immature Granulocytes 0.01 0.00 - 0.07 K/uL    Comment: Performed at [facility] [address], [place], [place] [address]  Basic metabolic panel     Status: Abnormal   Collection Time: 05/03/20 10:10 AM  Result Value  Ref Range   Sodium 140 135 - 145 mmol/L   Potassium 3.8 3.5 - 5.1 mmol/L   Chloride 104 98 - 111 mmol/L   CO2 29 22 - 32 mmol/L   Glucose, Bld 127 (H) 70 - 99 mg/dL    Comment: Glucose reference range applies only to samples taken after fasting for at least 8 hours.   BUN 13 8 - 23 mg/dL   Creatinine, Ser 0.81 0.61 - 1.24 mg/dL   Calcium 8.6 (L) 8.9 - 10.3 mg/dL   GFR, Estimated >60 >60 mL/min    Comment: (NOTE) Calculated using the CKD-EPI Creatinine Equation (2021)    Anion gap 7 5 - 15    Comment: Performed at [facility], [address] [place], [place] [address]    Blood Alcohol level:  Lab Results  Component Value Date   [facility] <10 01/22/2020   ETH <10 76/28/3151    Metabolic Disorder Labs: Lab Results  Component Value Date   HGBA1C 6.6 (H) 02/20/2020   MPG 142.72 02/20/2020   No results found for: PROLACTIN Lab Results  Component Value Date   CHOL 153  04/10/2015   TRIG 166 (H) 04/10/2015   HDL 50 04/10/2015   CHOLHDL 3.1 04/10/2015   VLDL 33 04/10/2015   LDLCALC 70 04/10/2015    Physical Findings: AIMS: Facial and Oral Movements Muscles of Facial Expression: None, normal Lips and Perioral Area: None, normal Jaw: None, normal Tongue: None, normal,Extremity Movements Upper (arms, wrists, hands, fingers): None, normal Lower (legs, knees, ankles, toes): None, normal, Trunk Movements Neck, shoulders, hips: None, normal, Overall Severity Severity of abnormal movements (highest score from questions above): None, normal Incapacitation due to abnormal movements: None, normal Patient's awareness of abnormal movements (rate only patient's report): No Awareness, Dental Status Current problems with teeth and/or dentures?: No Does patient usually wear dentures?: No  CIWA:    COWS:     Musculoskeletal: Strength & Muscle Tone: within normal limits Gait & Station: normal Patient leans: N/A  Psychiatric Specialty Exam: Physical Exam Vitals and nursing note reviewed.  Constitutional:      Appearance: Normal appearance.  HENT:     Head: Normocephalic and atraumatic.     Right Ear: External ear normal.     Left Ear: External ear normal.     Nose: Nose normal.     Mouth/Throat:     Mouth: Mucous membranes are moist.     Pharynx: Oropharynx is clear.  Eyes:     Extraocular Movements: Extraocular movements intact.     Conjunctiva/sclera: Conjunctivae normal.     Pupils: Pupils are equal, round, and reactive to light.  Cardiovascular:     Rate and Rhythm: Normal rate.     Pulses: Normal pulses.  Pulmonary:     Effort: Pulmonary effort is normal.     Breath sounds: Normal breath sounds.  Abdominal:     General: Abdomen is flat.     Palpations: Abdomen is soft.  Musculoskeletal:        General: No swelling. Normal range of motion.     Cervical back: Normal range of motion and neck supple.  Skin:    General: Skin is warm and dry.   Neurological:     General: No focal deficit present.     Mental Status: He is alert. Mental status is at baseline.  Psychiatric:        Mood and Affect: Mood normal.        Behavior: Behavior normal.  Thought Content: Thought content normal.        Judgment: Judgment normal.     Review of Systems  Constitutional: Negative for appetite change and fatigue.  HENT: Negative for rhinorrhea and sore throat.   Eyes: Negative for photophobia and visual disturbance.  Respiratory: Negative for cough and shortness of breath.   Cardiovascular: Negative for chest pain and palpitations.  Gastrointestinal: Negative for constipation, diarrhea, nausea and vomiting.  Endocrine: Negative for cold intolerance and heat intolerance.  Genitourinary: Negative for difficulty urinating and dysuria.  Musculoskeletal: Negative for back pain and neck stiffness.  Skin: Negative for rash and wound.  Allergic/Immunologic: Negative for food allergies and immunocompromised state.  Neurological: Negative for dizziness and headaches.  Hematological: Negative for adenopathy. Does not bruise/bleed easily.  Psychiatric/Behavioral: Negative for hallucinations and suicidal ideas.    Blood pressure 116/78, pulse 77, temperature 97.6 F (36.4 C), temperature source Oral, resp. rate 16, height 5\' 8"  (1.727 m), weight 104.3 kg, SpO2 95 %.Body mass index is 34.96 kg/m.  General Appearance: Casual  Eye Contact:  Good  Speech:  Clear and Coherent  Volume:  Normal  Mood:  Hopeless  Affect:  Congruent  Thought Process:  Coherent  Orientation:  Full (Time, Place, and Person)  Thought Content:  Logical  Suicidal Thoughts:  No  Homicidal Thoughts:  No  Memory:  Immediate;   Fair Recent;   Fair Remote;   Fair  Judgement:  Fair  Insight:  Fair  Psychomotor Activity:  Normal  Concentration:  Concentration: Fair and Attention Span: Fair  Recall:  [employer] of Knowledge:  Fair  Language:  Fair  Akathisia:  Negative   Handed:  Right  AIMS (if indicated):     Assets:  Communication Skills Desire for Improvement Resilience Social Support  ADL's:  Intact  Cognition:  WNL  Sleep:  Number of Hours: 6.5     Treatment Plan Summary: Daily contact with patient to assess and evaluate symptoms and progress in treatment and Medication management. Plan: no changes to current medications. Patient is at baseline, but requires placement.   Salley Scarlet, MD 05/04/2020, 12:21 PM

## 2020-05-04 NOTE — [facility] Counselor (Signed)
CSW spoke with Sula Rumple, [phone].  He reports that he has received the paperwork, however, has not reviewed it.   CSW followed up on referral to Chrystie Nose, [phone].  She reports "we're still debating".  She reports that she will return call this evening or "first thing in the morning".   Assunta Curtis, MSW, LCSW 05/04/2020 1:39 PM

## 2020-05-05 DIAGNOSIS — F0391 Unspecified dementia with behavioral disturbance: Secondary | ICD-10-CM | POA: Diagnosis not present

## 2020-05-05 NOTE — Tx Team (Addendum)
Interdisciplinary Treatment and Diagnostic Plan Update  05/05/2020 Time of Session: 8:30AM  Alexander Beard MRN: [id number]  Principal Diagnosis: Dementia [facility])  Secondary Diagnoses: Principal Problem:   Dementia ([place]) Active Problems:   Schizoaffective disorder, bipolar type ([place])   Active autistic disorder   Diabetes ([place])   Hypertension   Prostate hypertrophy   Intellectual disability   At risk for elopement   Current Medications:  Current Facility-Administered Medications  Medication Dose Route Frequency Provider Last Rate Last Admin  . acetaminophen (TYLENOL) tablet 650 mg  650 mg Oral Q6H PRN [name], MD      . alum & mag hydroxide-simeth (MAALOX/MYLANTA) 200-200-20 MG/5ML suspension 30 mL  30 mL Oral Q4H PRN [name], MD      . aspirin EC tablet 81 mg  81 mg Oral Daily [name], [name], MD   81 mg at 05/05/20 3716  . clonazePAM (KLONOPIN) tablet 1 mg  1 mg Oral QID [name], [name], MD   1 mg at 05/05/20 0829  . divalproex (DEPAKOTE ER) 24 hr tablet 1,500 mg  1,500 mg Oral QHS [name], [name], MD   1,500 mg at 05/04/20 2123  . gabapentin (NEURONTIN) capsule 100 mg  100 mg Oral TID [name], [name], MD   100 mg at 05/05/20 0829  . hydrOXYzine (ATARAX/VISTARIL) tablet 10 mg  10 mg Oral TID PRN [name], MD   10 mg at 04/20/20 2128  . influenza vac split quadrivalent PF (FLUARIX) injection 0.5 mL  0.5 mL Intramuscular Tomorrow-1000 [name], [name], MD      . lamoTRIgine (LAMICTAL) tablet 100 mg  100 mg Oral QHS [name], [name], MD   100 mg at 05/04/20 2124  . linagliptin (TRADJENTA) tablet 5 mg  5 mg Oral Daily [name], [name], MD   5 mg at 05/05/20 0828  . loperamide (IMODIUM) capsule 2 mg  2 mg Oral Q4H PRN [name], MD   2 mg at 04/10/20 0917  . loratadine (CLARITIN) tablet 10 mg  10 mg Oral Daily [name], [name], MD   10 mg at 05/05/20 9678  . LORazepam (ATIVAN) tablet 2 mg  2 mg Oral Q6H PRN [name], [name], MD   2 mg at 05/02/20 1211  . magnesium  hydroxide (MILK OF MAGNESIA) suspension 30 mL  30 mL Oral Daily PRN [name], MD   30 mL at 03/23/20 1344  . metFORMIN (GLUCOPHAGE) tablet 850 mg  850 mg Oral BID WC [name], [name], MD   850 mg at 05/05/20 9381  . metoprolol succinate (TOPROL-XL) 24 hr tablet 50 mg  50 mg Oral Daily [name], [name], MD   50 mg at 05/05/20 0829  . ondansetron (ZOFRAN) tablet 4 mg  4 mg Oral Q8H PRN [name], [name], MD   4 mg at 04/30/20 2035  . oxybutynin (DITROPAN) tablet 10 mg  10 mg Oral BID [name], [name], MD   10 mg at 05/05/20 0829  . pantoprazole (PROTONIX) EC tablet 40 mg  40 mg Oral Daily [name], [name], MD   40 mg at 05/05/20 0830  . prazosin (MINIPRESS) capsule 1 mg  1 mg Oral QHS [name], [name], MD   1 mg at 05/04/20 2124  . QUEtiapine (SEROQUEL) tablet 100 mg  100 mg Oral QID [name], [name], MD   100 mg at 05/05/20 0829  . QUEtiapine (SEROQUEL) tablet 400 mg  400 mg Oral QHS [name], NP   400  mg at 05/04/20 2123  . sertraline (ZOLOFT) tablet 50 mg  50 mg Oral BID [name], [name], MD   50 mg at 05/05/20 1245  . simethicone (MYLICON) chewable tablet 80 mg  80 mg Oral TID [name], [name], MD   80 mg at 05/05/20 0827  . simvastatin (ZOCOR) tablet 20 mg  20 mg Oral q1800 [name], [name], MD   20 mg at 05/04/20 1653  . tamsulosin (FLOMAX) capsule 0.8 mg  0.8 mg Oral QPC supper [name], [name], MD   0.8 mg at 05/04/20 1652   PTA Medications: Medications Prior to Admission  Medication Sig Dispense Refill Last Dose  . acetaminophen (TYLENOL) 650 MG CR tablet Take 650 mg by mouth every 12 (twelve) hours as needed for pain.     Marland Kitchen aspirin EC 81 MG tablet Take 81 mg by mouth daily.     . Calcium Carbonate-Vitamin D3 (CALCIUM 600-D) 600-400 MG-UNIT TABS Take 1 tablet by mouth 2 (two) times daily.     . Cholecalciferol (VITAMIN D-1000 MAX ST) 1000 UNITS tablet Take 1,000 Units by mouth daily.      Marland Kitchen docusate sodium (COLACE) 100 MG capsule Take 100 mg by mouth daily.      . furosemide  (LASIX) 20 MG tablet Take 20 mg by mouth daily.      Marland Kitchen gabapentin (NEURONTIN) 100 MG capsule Take 1 capsule (100 mg total) by mouth 3 (three) times daily. 90 capsule 10   . Insulin Detemir (LEVEMIR FLEXTOUCH) 100 UNIT/ML Pen Inject 15 Units into the skin daily at 10 pm.     . lamoTRIgine (LAMICTAL) 100 MG tablet Take 1 tablet (100 mg total) by mouth at bedtime. 30 tablet 10   . loratadine (CLARITIN) 10 MG tablet Take 1 tablet (10 mg total) by mouth daily. 30 tablet 5   . LORazepam (ATIVAN) 1 MG tablet Take 1 tablet (1 mg total) by mouth 3 (three) times daily. 90 tablet 5   . lurasidone (LATUDA) 40 MG TABS tablet Take 1 tablet (40 mg total) by mouth daily. with food 30 tablet 10   . metoprolol succinate (TOPROL-XL) 50 MG 24 hr tablet Take 50 mg by mouth daily.      . NON FORMULARY cpap device     . oxybutynin (DITROPAN) 5 MG tablet Take 5 mg by mouth daily.     . pantoprazole (PROTONIX) 40 MG tablet Take 40 mg by mouth daily.     . polyethylene glycol (MIRALAX / GLYCOLAX) packet Take 17 g by mouth daily.     . QUEtiapine (SEROQUEL) 100 MG tablet TAKE 1 TABLET  BY MOUTH THREE TIMES A DAY (Patient taking differently: Take 100 mg by mouth 3 (three) times daily. TAKE 1 TABLET  BY MOUTH THREE TIMES A DAY) 90 tablet 11   . QUEtiapine (SEROQUEL) 400 MG tablet Take 1 tablet (400 mg total) by mouth at bedtime. 30 tablet 10   . sertraline (ZOLOFT) 100 MG tablet Take 2 tablets (200 mg total) by mouth daily. (Patient taking differently: Take 200 mg by mouth daily. Take along with two 50 mg tablets (100 mg) for total 300 mg daily) 60 tablet 5   . sertraline (ZOLOFT) 50 MG tablet Take 2 tablets (100 mg total) by mouth daily. (Patient taking differently: Take 100 mg by mouth daily. Take along with two 100 mg tablets (200 mg) for total 300 mg daily) 60 tablet 5   . simethicone (MYLICON) 80 MG chewable tablet Chew  80-160 mg by mouth 2 (two) times daily as needed for flatulence.     . simvastatin (ZOCOR) 20 MG tablet  Take 20 mg by mouth at bedtime.      . sitaGLIPtin (JANUVIA) 100 MG tablet Take 100 mg by mouth daily.     . solifenacin (VESICARE) 5 MG tablet Take 5 mg by mouth daily.     . Starch (HEMORRHOIDAL RE) Place 1 application rectally 4 (four) times daily as needed (for itching).     . tamsulosin (FLOMAX) 0.4 MG CAPS capsule Take 0.4 mg by mouth daily.       Patient Stressors: Health problems Marital or family conflict Medication change or noncompliance Traumatic event  Patient Strengths: Motivation for treatment/growth Religious Affiliation  Treatment Modalities: Medication Management, Group therapy, Case management,  1 to 1 session with clinician, Psychoeducation, Recreational therapy.   Physician Treatment Plan for Primary Diagnosis: Dementia [facility]) Long Term Goal(s): Improvement in symptoms so as ready for discharge Improvement in symptoms so as ready for discharge   Short Term Goals: Ability to verbalize feelings will improve Ability to demonstrate self-control will improve Ability to maintain clinical measurements within normal limits will improve Compliance with prescribed medications will improve  Medication Management: Evaluate patient's response, side effects, and tolerance of medication regimen.  Therapeutic Interventions: 1 to 1 sessions, Unit Group sessions and Medication administration.  Evaluation of Outcomes: Progressing  Physician Treatment Plan for Secondary Diagnosis: Principal Problem:   Dementia ([place]) Active Problems:   Schizoaffective disorder, bipolar type ([place])   Active autistic disorder   Diabetes ([place])   Hypertension   Prostate hypertrophy   Intellectual disability   At risk for elopement  Long Term Goal(s): Improvement in symptoms so as ready for discharge Improvement in symptoms so as ready for discharge   Short Term Goals: Ability to verbalize feelings will improve Ability to demonstrate self-control will improve Ability to maintain clinical  measurements within normal limits will improve Compliance with prescribed medications will improve     Medication Management: Evaluate patient's response, side effects, and tolerance of medication regimen.  Therapeutic Interventions: 1 to 1 sessions, Unit Group sessions and Medication administration.  Evaluation of Outcomes: Progressing   RN Treatment Plan for Primary Diagnosis: Dementia ([place]) Long Term Goal(s): Knowledge of disease and therapeutic regimen to maintain health will improve  Short Term Goals: Ability to demonstrate self-control, Ability to participate in decision making will improve, Ability to verbalize feelings will improve, Ability to identify and develop effective coping behaviors will improve and Compliance with prescribed medications will improve  Medication Management: RN will administer medications as ordered by provider, will assess and evaluate patient's response and provide education to patient for prescribed medication. RN will report any adverse and/or side effects to prescribing provider.  Therapeutic Interventions: 1 on 1 counseling sessions, Psychoeducation, Medication administration, Evaluate responses to treatment, Monitor vital signs and CBGs as ordered, Perform/monitor CIWA, COWS, AIMS and Fall Risk screenings as ordered, Perform wound care treatments as ordered.  Evaluation of Outcomes: Progressing   LCSW Treatment Plan for Primary Diagnosis: Dementia ([place]) Long Term Goal(s): Safe transition to appropriate next level of care at discharge, Engage patient in therapeutic group addressing interpersonal concerns.  Short Term Goals: Engage patient in aftercare planning with referrals and resources, Increase social support, Increase ability to appropriately verbalize feelings, Increase emotional regulation and Increase skills for wellness and recovery  Therapeutic Interventions: Assess for all discharge needs, 1 to 1 time with Social worker, Explore available  resources  and support systems, Assess for adequacy in community support network, Educate family and significant other(s) on suicide prevention, Complete Psychosocial Assessment, Interpersonal group therapy.  Evaluation of Outcomes: Progressing   Progress in Treatment: Attending groups: Yes. Participating in groups: Yes. Taking medication as prescribed: Yes. Toleration medication: Yes. Family/Significant other contact made: Yes, individual(s) contacted:  [place], brother  Patient understands diagnosis: Yes. Discussing patient identified problems/goals with staff: Yes. Medical problems stabilized or resolved: Yes. Denies suicidal/homicidal ideation: No. Issues/concerns per patient self-inventory: No. Other: N/a   New problem(s) identified: No, Describe:  None   New Short Term/Long Term Goal(s): Elimination of symptoms of psychosis, medication management for mood stabilization; elimination of SI thoughts; development of comprehensive mental wellness/sobriety plan.Update 03/01/20:No changes at this time. 03/07/20: Update, no changes at this time Update 03/12/2020: No changes at this time.Update 03/17/2020:No changes at this time. Update 03/21/20:No changes at this time. Update 03/26/20: No changes at this time. Update 03/31/2020: No changes at this time.Update 04/04/20: No changes at this time Update 04/09/2020:  No changes at this time.  Update 04/14/2020:  No changes at this time.  Update 04/20/2020:  No changes at this time. Update: 04/25/2020: No changes at this time. Update 04/30/2020:  No changes at this time. Update 05/05/2020: No changes at this time.  Patient Goals:  Patient stated he would like to go back to [place] and reconnect with his peer support. Patient also stated that he would like to increase his social support and help his brother.Update 03/07/20,no change Update 03/12/2020: No changes at this time. Update 03/17/2020:No changes at this time.Update 03/21/20: No changes at this  time. Update 03/26/20: No changes at this time. Update 03/31/2020: No changes at this time. Update9/25/21: No changes at this time.  Update 04/09/2020:  No changes at this time.  Update 04/14/2020:  No changes at this time.  Update 04/20/2020:  No changes at this time. 04/25/2020: Patient still indicates that he would like to go back to his old ways and life. Patient would like to get back into the community. 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 Update 04/30/2020:  No changes at this time. Update 05/05/2020: No changes at this time. Patient maintains that he wants to get back to his old way of life, volunteer in the community, do his artwork, and help his brother.  Discharge Plan or Barriers:  Patient has an interview with a group home on 03/09/20 at 2:00 PM. Update 03/12/2020: CSW continues to assist the patient in developing a housing plan.Update 03/17/2020:CSW has assisted patient in completing an interview with a group home, however, there has been no word on if the patient has been approved. Patient continues to struggle with his anxiety and nerves. Nurses report that he has increased in bed wetting. Psychiatrist and nurses believe that this may be related to his increased anxiety about finding a home.Update 03/21/20: Patient is still nervous and anxious about finding a new placement. Patient stated he could not stay here too much longer and wanted his life back. Update 03/26/20: CSW continues to look for placement for pt. Group Homes, [place] are being considered. Update 03/31/2020: Patient continues to have interview with group homes, however, no success at this time in identifying a home that has accepted him.Update: 04/04/20: No changes at this time.  Update 04/09/2020:  CSW continues to look for placement for patient.  Update 04/14/2020:  No changes at this time.  CSW continues to look  for placement.  Update 04/20/2020:  CSW continues to assist with  placement needs. 04/25/2020: CSW continues to look for placement options. Update 04/30/2020:  CSW continues to look for placement for the patient.  Patient has been declined for 17 SNF's however, there are a quite a few where the referral is pending.  CSW has contacted several group homes, however, none have selected the patient. Update 05/05/2020: CSW continues to look for placement for the patient. Patient has been declined at 15 SNF's with quite a few referrals left pending. CSW also sent out referrals for memory care facilities. CSW continues to contact groups homes without any selecting the patient at the moment.  Reason for Continuation of Hospitalization: Anxiety Depression Medication stabilization  Estimated Length of Stay: TBD  Attendees: Patient:  05/05/2020 10:49 AM  Physician: Dr. Domingo Cocking, MD 05/05/2020 10:49 AM  Nursing:  05/05/2020 10:49 AM  RN Care Manager: 05/05/2020 10:49 AM  Social Worker: Assunta Curtis, MSW, LCSW 05/05/2020 10:49 AM  Recreational Therapist:  05/05/2020 10:49 AM  Other: Gwynneth Munson" [place], LCSW 05/05/2020 10:49 AM  Other:  05/05/2020 10:49 AM  Other: 05/05/2020 10:49 AM    Scribe for Treatment Team: Shirl Harris, LCSW 05/05/2020 10:49 AM

## 2020-05-05 NOTE — Plan of Care (Signed)
Patient presents at baseline  Problem: Education: Goal: Emotional status will improve Outcome: Not Progressing Goal: Mental status will improve Outcome: Not Progressing

## 2020-05-05 NOTE — [facility] Counselor (Signed)
CSW returned call from [place],  [phone].  [name] is helping with placement.   [name] requested information on patient necesity for Klonopin, any recent behaviors and concerns for patient's ability to complete his ADL's.  CSW checked with psychiatrist who reports that she could taper patient off of Klonopin if needed, though expressed some concern for pt's anxiety.   CSW reports that there have been no behaviors in the last 2 weeks.  [name] asked for notes to be sent to DeeDee at [facility], [phone] along with medication list and Fl2.  CSW reports that she can send the information.  Assunta Curtis, MSW, LCSW 05/05/2020 3:53 PM

## 2020-05-05 NOTE — Plan of Care (Signed)
Problem: Education: Goal: Knowledge of [place] [facility] Education information/materials will improve Outcome: Progressing Goal: Emotional status will improve Outcome: Progressing Goal: Mental status will improve Outcome: Progressing Goal: Verbalization of understanding the information provided will improve Outcome: Progressing   Problem: Activity: Goal: Interest or engagement in activities will improve Outcome: Progressing Goal: Sleeping patterns will improve Outcome: Progressing   Problem: Coping: Goal: Ability to verbalize frustrations and anger appropriately will improve Outcome: Progressing Goal: Ability to demonstrate self-control will improve Outcome: Progressing   Problem: Health Behavior/Discharge Planning: Goal: Identification of resources available to assist in meeting health care needs will improve Outcome: Progressing Goal: Compliance with treatment plan for underlying cause of condition will improve Outcome: Progressing   Problem: Physical Regulation: Goal: Ability to maintain clinical measurements within normal limits will improve Outcome: Progressing   Problem: Safety: Goal: Periods of time without injury will increase Outcome: Progressing   Problem: Education: Goal: Ability to make informed decisions regarding treatment will improve Outcome: Progressing   Problem: Coping: Goal: Coping ability will improve Outcome: Progressing   Problem: Health Behavior/Discharge Planning: Goal: Identification of resources available to assist in meeting health care needs will improve Outcome: Progressing   Problem: Medication: Goal: Compliance with prescribed medication regimen will improve Outcome: Progressing   Problem: Self-Concept: Goal: Ability to disclose and discuss suicidal ideas will improve Outcome: Progressing Goal: Will verbalize positive feelings about self Outcome: Progressing   Problem: Coping: Goal: Coping ability will improve Outcome:  Progressing Goal: Will verbalize feelings Outcome: Progressing   Problem: Education: Goal: Utilization of techniques to improve thought processes will improve Outcome: Progressing Goal: Knowledge of the prescribed therapeutic regimen will improve Outcome: Progressing   Problem: Activity: Goal: Interest or engagement in leisure activities will improve Outcome: Progressing Goal: Imbalance in normal sleep/wake cycle will improve Outcome: Progressing   Problem: Coping: Goal: Coping ability will improve Outcome: Progressing Goal: Will verbalize feelings Outcome: Progressing   Problem: Health Behavior/Discharge Planning: Goal: Ability to make decisions will improve Outcome: Progressing Goal: Compliance with therapeutic regimen will improve Outcome: Progressing   Problem: Role Relationship: Goal: Will demonstrate positive changes in social behaviors and relationships Outcome: Progressing   Problem: Safety: Goal: Ability to disclose and discuss suicidal ideas will improve Outcome: Progressing Goal: Ability to identify and utilize support systems that promote safety will improve Outcome: Progressing   Problem: Self-Concept: Goal: Will verbalize positive feelings about self Outcome: Progressing Goal: Level of anxiety will decrease Outcome: Progressing   Problem: Education: Goal: Knowledge of  General Education information/materials will improve Outcome: Progressing Goal: Emotional status will improve Outcome: Progressing Goal: Mental status will improve Outcome: Progressing Goal: Verbalization of understanding the information provided will improve Outcome: Progressing   Problem: Activity: Goal: Interest or engagement in activities will improve Outcome: Progressing Goal: Sleeping patterns will improve Outcome: Progressing   Problem: Coping: Goal: Ability to verbalize frustrations and anger appropriately will improve Outcome: Progressing Goal: Ability to  demonstrate self-control will improve Outcome: Progressing   Problem: Health Behavior/Discharge Planning: Goal: Identification of resources available to assist in meeting health care needs will improve Outcome: Progressing Goal: Compliance with treatment plan for underlying cause of condition will improve Outcome: Progressing   Problem: Physical Regulation: Goal: Ability to maintain clinical measurements within normal limits will improve Outcome: Progressing   Problem: Safety: Goal: Periods of time without injury will increase Outcome: Progressing   Problem: Education: Goal: Utilization of techniques to improve thought processes will improve Outcome: Progressing Goal: Knowledge of the prescribed therapeutic regimen  will improve Outcome: Progressing   Problem: Activity: Goal: Interest or engagement in leisure activities will improve Outcome: Progressing Goal: Imbalance in normal sleep/wake cycle will improve Outcome: Progressing   Problem: Coping: Goal: Coping ability will improve Outcome: Progressing Goal: Will verbalize feelings Outcome: Progressing   Problem: Health Behavior/Discharge Planning: Goal: Ability to make decisions will improve Outcome: Progressing Goal: Compliance with therapeutic regimen will improve Outcome: Progressing   Problem: Role Relationship: Goal: Will demonstrate positive changes in social behaviors and relationships Outcome: Progressing   Problem: Safety: Goal: Ability to disclose and discuss suicidal ideas will improve Outcome: Progressing Goal: Ability to identify and utilize support systems that promote safety will improve Outcome: Progressing   Problem: Self-Concept: Goal: Will verbalize positive feelings about self Outcome: Progressing Goal: Level of anxiety will decrease Outcome: Progressing   Problem: Education: Goal: Knowledge of [place] [facility] Education information/materials will improve Outcome: Progressing Goal:  Emotional status will improve Outcome: Progressing Goal: Mental status will improve Outcome: Progressing Goal: Verbalization of understanding the information provided will improve Outcome: Progressing   Problem: Activity: Goal: Interest or engagement in activities will improve Outcome: Progressing Goal: Sleeping patterns will improve Outcome: Progressing   Problem: Coping: Goal: Ability to verbalize frustrations and anger appropriately will improve Outcome: Progressing Goal: Ability to demonstrate self-control will improve Outcome: Progressing   Problem: Health Behavior/Discharge Planning: Goal: Identification of resources available to assist in meeting health care needs will improve Outcome: Progressing Goal: Compliance with treatment plan for underlying cause of condition will improve Outcome: Progressing   Problem: Physical Regulation: Goal: Ability to maintain clinical measurements within normal limits will improve Outcome: Progressing   Problem: Safety: Goal: Periods of time without injury will increase Outcome: Progressing

## 2020-05-05 NOTE — Progress Notes (Signed)
Recreation Therapy Notes  Date: 05/05/2020  Time: 9:30 am    Location: Craft room   Behavioral response: Appropriate  Intervention Topic: Stress Management    Discussion/Intervention:  Group content on today was focused on stress. The group defined stress and way to cope with stress. Participants expressed how they know when they are stresses out. Individuals described the different ways they have to cope with stress. The group stated reasons why it is important to cope with stress. Patient explained what good stress is and some examples. The group participated in the intervention "Stress Management". Individuals were separated into two group and answered questions related to stress. Clinical Observations/Feedback: Patient came to group and a stated that he manages his stress by using his personal system of lying down and breathing when he is overwhelmed. He express that stress is just conflict and irritability. Participant explained that good stress can promote friendship. Individual was social with peers and staff while participating in the intervention.  Hitomi Slape LRT/CTRS         Keyler Hoge 05/05/2020 12:04 PM

## 2020-05-05 NOTE — Progress Notes (Signed)
[facility] MD Progress Note  05/05/2020 12:02 PM Alexander Beard  MRN:  [id number]   Subjective:  Alexander Beard seen one-on-one in office.  He had no behavioral issues overnight or today.  He denies suicidal ideations, homicidal ideations, visual hallucinations, or auditory hallucinations.  He notes that he is upset that no one will accept him to a group home based on past behavior of running away. He feels he has been able to learn how to cope with his panic attacks well enough to not do this in the future. He continues to focus on volunteering in the community. He also hopes to play the game corn hole in the courtyard with his fellow patients today.   Principal Problem: Dementia ([place]) Diagnosis: Principal Problem:   Dementia ([place]) Active Problems:   Schizoaffective disorder, bipolar type ([place])   Active autistic disorder   Diabetes ([place])   Hypertension   Prostate hypertrophy   Intellectual disability   At risk for elopement  Total Time spent with patient: 30 minutes  Past Psychiatric History: Past history of longstanding impairment see many previous notes  Past Medical History:  Past Medical History:  Diagnosis Date  . Anxiety   . Anxiety disorder due to known physiological condition    HOSPITALIZED 10/18  . Arthritis   . Autistic disorder, residual state   . COPD (chronic obstructive pulmonary disease) ([place])   . Depression   . Developmental disorder   . Diabetes mellitus without complication ([place])   . Dyslipidemia   . Esophageal reflux   . HOH (hard of hearing)    MILDLY  . Hypertension   . Obesity   . Overactive bladder   . Palpitations    ANXIETY  . Schizophrenia, schizoaffective ([place])   . Sleep apnea   . Tremors of nervous system    HANDS DUE TO MEDICATIONS  . Urinary incontinence     Past Surgical History:  Procedure Laterality Date  . CATARACT EXTRACTION W/PHACO Left 11/14/2017   Procedure: CATARACT EXTRACTION PHACO AND INTRAOCULAR LENS PLACEMENT (IOC);  Surgeon:  Birder Robson, MD;  Location: [facility] ORS;  Service: Ophthalmology;  Laterality: Left;  [place] 00:42 AP% 14.6 CDE 6.12 Fluid pack lot # [id number]  . COLONOSCOPY  01/28/2005  . COLONOSCOPY WITH PROPOFOL N/A 05/09/2016   Procedure: COLONOSCOPY WITH PROPOFOL;  Surgeon: Manya Silvas, MD;  Location: [facility] ENDOSCOPY;  Service: Endoscopy;  Laterality: N/A;  . ESOPHAGOGASTRODUODENOSCOPY N/A 05/09/2016   Procedure: ESOPHAGOGASTRODUODENOSCOPY (EGD);  Surgeon: Manya Silvas, MD;  Location: [facility] ENDOSCOPY;  Service: Endoscopy;  Laterality: N/A;  . FRACTURE SURGERY     ORIF SHOULDER  . TONSILLECTOMY     Family History:  Family History  Problem Relation Age of Onset  . Hypertension Mother   . Stroke Father   . Heart Problems Father    Family Psychiatric  History: see previous Social History:  Social History   Substance and Sexual Activity  Alcohol Use No  . Alcohol/week: 0.0 standard drinks     Social History   Substance and Sexual Activity  Drug Use No    Social History   Socioeconomic History  . Marital status: Single    Spouse name: Not on file  . Number of children: Not on file  . Years of education: Not on file  . Highest education level: Not on file  Occupational History  . Not on file  Tobacco Use  . Smoking status: Never Smoker  . Smokeless tobacco: Never Used  Vaping Use  .  Vaping Use: Never used  Substance and Sexual Activity  . Alcohol use: No    Alcohol/week: 0.0 standard drinks  . Drug use: No  . Sexual activity: Never  Other Topics Concern  . Not on file  Social History Narrative   The patient never finished high school but did get his GED. 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 He is currently in disability and [name] is his legal guardian. He has been living in group homes for many years.      No pending legal charges   Social Determinants of Health   Financial Resource Strain:    . Difficulty of Paying Living Expenses: Not on file  Food Insecurity:   . Worried About Charity fundraiser in the Last Year: Not on file  . Ran Out of Food in the Last Year: Not on file  Transportation Needs:   . Lack of Transportation (Medical): Not on file  . Lack of Transportation (Non-Medical): Not on file  Physical Activity:   . Days of Exercise per Week: Not on file  . Minutes of Exercise per Session: Not on file  Stress:   . Feeling of Stress : Not on file  Social Connections:   . Frequency of Communication with Friends and Family: Not on file  . Frequency of Social Gatherings with Friends and Family: Not on file  . Attends Religious Services: Not on file  . Active Member of Clubs or Organizations: Not on file  . Attends Archivist Meetings: Not on file  . Marital Status: Not on file   Additional Social History:                         Sleep: Good  Appetite:  Good  Current Medications: Current Facility-Administered Medications  Medication Dose Route Frequency Provider Last Rate Last Admin  . acetaminophen (TYLENOL) tablet 650 mg  650 mg Oral Q6H PRN [name], MD      . alum & mag hydroxide-simeth (MAALOX/MYLANTA) 200-200-20 MG/5ML suspension 30 mL  30 mL Oral Q4H PRN [name], MD      . aspirin EC tablet 81 mg  81 mg Oral Daily [name], [name], MD   81 mg at 05/05/20 9163  . clonazePAM (KLONOPIN) tablet 1 mg  1 mg Oral QID [name], [name], MD   1 mg at 05/05/20 0829  . divalproex (DEPAKOTE ER) 24 hr tablet 1,500 mg  1,500 mg Oral QHS [name], [name], MD   1,500 mg at 05/04/20 2123  . gabapentin (NEURONTIN) capsule 100 mg  100 mg Oral TID [name], [name], MD   100 mg at 05/05/20 0829  . hydrOXYzine (ATARAX/VISTARIL) tablet 10 mg  10 mg Oral TID PRN [name], MD   10 mg at 04/20/20 2128  . influenza vac split quadrivalent PF (FLUARIX) injection 0.5 mL  0.5 mL Intramuscular Tomorrow-1000 [name], [name], MD      . lamoTRIgine  (LAMICTAL) tablet 100 mg  100 mg Oral QHS [name], [name], MD   100 mg at 05/04/20 2124  . linagliptin (TRADJENTA) tablet 5 mg  5 mg Oral Daily [name], [name], MD   5 mg at 05/05/20 0828  . loperamide (IMODIUM) capsule 2 mg  2 mg Oral Q4H PRN [name], MD   2 mg at 04/10/20 0917  . loratadine (CLARITIN) tablet 10  mg  10 mg Oral Daily [name], [name], MD   10 mg at 05/05/20 9937  . LORazepam (ATIVAN) tablet 2 mg  2 mg Oral Q6H PRN [name], [name], MD   2 mg at 05/02/20 1211  . magnesium hydroxide (MILK OF MAGNESIA) suspension 30 mL  30 mL Oral Daily PRN [name], MD   30 mL at 03/23/20 1344  . metFORMIN (GLUCOPHAGE) tablet 850 mg  850 mg Oral BID WC [name], [name], MD   850 mg at 05/05/20 1696  . metoprolol succinate (TOPROL-XL) 24 hr tablet 50 mg  50 mg Oral Daily [name], [name], MD   50 mg at 05/05/20 0829  . ondansetron (ZOFRAN) tablet 4 mg  4 mg Oral Q8H PRN [name], [name], MD   4 mg at 04/30/20 2035  . oxybutynin (DITROPAN) tablet 10 mg  10 mg Oral BID [name], [name], MD   10 mg at 05/05/20 0829  . pantoprazole (PROTONIX) EC tablet 40 mg  40 mg Oral Daily [name], [name], MD   40 mg at 05/05/20 0830  . prazosin (MINIPRESS) capsule 1 mg  1 mg Oral QHS [name], [name], MD   1 mg at 05/04/20 2124  . QUEtiapine (SEROQUEL) tablet 100 mg  100 mg Oral QID [name], [name], MD   100 mg at 05/05/20 0829  . QUEtiapine (SEROQUEL) tablet 400 mg  400 mg Oral QHS [name], NP   400 mg at 05/04/20 2123  . sertraline (ZOLOFT) tablet 50 mg  50 mg Oral BID [name], [name], MD   50 mg at 05/05/20 7893  . simethicone (MYLICON) chewable tablet 80 mg  80 mg Oral TID [name], [name], MD   80 mg at 05/05/20 0827  . simvastatin (ZOCOR) tablet 20 mg  20 mg Oral q1800 [name], [name], MD   20 mg at 05/04/20 1653  . tamsulosin (FLOMAX) capsule 0.8 mg  0.8 mg Oral QPC supper [name], [name], MD   0.8 mg at 05/04/20 1652    Lab Results:  No results found for this or any previous visit (from  the past 48 hour(s)).  Blood Alcohol level:  Lab Results  Component Value Date   ETH <10 01/22/2020   ETH <10 81/07/7508    Metabolic Disorder Labs: Lab Results  Component Value Date   HGBA1C 6.6 (H) 02/20/2020   MPG 142.72 02/20/2020   No results found for: PROLACTIN Lab Results  Component Value Date   CHOL 153 04/10/2015   TRIG 166 (H) 04/10/2015   HDL 50 04/10/2015   CHOLHDL 3.1 04/10/2015   VLDL 33 04/10/2015   LDLCALC 70 04/10/2015    Physical Findings: AIMS: Facial and Oral Movements Muscles of Facial Expression: None, normal Lips and Perioral Area: None, normal Jaw: None, normal Tongue: None, normal,Extremity Movements Upper (arms, wrists, hands, fingers): None, normal Lower (legs, knees, ankles, toes): None, normal, Trunk Movements Neck, shoulders, hips: None, normal, Overall Severity Severity of abnormal movements (highest score from questions above): None, normal Incapacitation due to abnormal movements: None, normal Patient's awareness of abnormal movements (rate only patient's report): No Awareness, Dental Status Current problems with teeth and/or dentures?: No Does patient usually wear dentures?: No  CIWA:    COWS:     Musculoskeletal: Strength & Muscle Tone: within normal limits Gait & Station: normal Patient leans: N/A  Psychiatric Specialty Exam: Physical Exam Vitals and nursing note reviewed.  Constitutional:      Appearance: Normal  appearance.  HENT:     Head: Normocephalic and atraumatic.     Right Ear: External ear normal.     Left Ear: External ear normal.     Nose: Nose normal.     Mouth/Throat:     Mouth: Mucous membranes are moist.     Pharynx: Oropharynx is clear.  Eyes:     Extraocular Movements: Extraocular movements intact.     Conjunctiva/sclera: Conjunctivae normal.     Pupils: Pupils are equal, round, and reactive to light.  Cardiovascular:     Rate and Rhythm: Normal rate.     Pulses: Normal pulses.  Pulmonary:      Effort: Pulmonary effort is normal.     Breath sounds: Normal breath sounds.  Abdominal:     General: Abdomen is flat.     Palpations: Abdomen is soft.  Musculoskeletal:        General: No swelling. Normal range of motion.     Cervical back: Normal range of motion and neck supple.  Skin:    General: Skin is warm and dry.  Neurological:     General: No focal deficit present.     Mental Status: He is alert. Mental status is at baseline.  Psychiatric:        Mood and Affect: Mood normal.        Behavior: Behavior normal.        Thought Content: Thought content normal.        Judgment: Judgment normal.     Review of Systems  Constitutional: Negative for appetite change and fatigue.  HENT: Negative for rhinorrhea and sore throat.   Eyes: Negative for photophobia and visual disturbance.  Respiratory: Negative for cough and shortness of breath.   Cardiovascular: Negative for chest pain and palpitations.  Gastrointestinal: Negative for constipation, diarrhea, nausea and vomiting.  Endocrine: Negative for cold intolerance and heat intolerance.  Genitourinary: Negative for difficulty urinating and dysuria.  Musculoskeletal: Negative for back pain and neck stiffness.  Skin: Negative for rash and wound.  Allergic/Immunologic: Negative for food allergies and immunocompromised state.  Neurological: Negative for dizziness and headaches.  Hematological: Negative for adenopathy. Does not bruise/bleed easily.  Psychiatric/Behavioral: Negative for hallucinations and suicidal ideas.    Blood pressure 126/75, pulse 78, temperature 98.4 F (36.9 C), temperature source Oral, resp. rate 18, height 5\' 8"  (1.727 m), weight 104.3 kg, SpO2 97 %.Body mass index is 34.96 kg/m.  General Appearance: Casual  Eye Contact:  Good  Speech:  Clear and Coherent  Volume:  Normal  Mood:  Hopeless  Affect:  Congruent  Thought Process:  Coherent  Orientation:  Full (Time, Place, and Person)  Thought Content:   Logical  Suicidal Thoughts:  No  Homicidal Thoughts:  No  Memory:  Immediate;   Fair Recent;   Fair Remote;   Fair  Judgement:  Fair  Insight:  Fair  Psychomotor Activity:  Normal  Concentration:  Concentration: Fair and Attention Span: Fair  Recall:  [employer] of Knowledge:  Fair  Language:  Fair  Akathisia:  Negative  Handed:  Right  AIMS (if indicated):     Assets:  Communication Skills Desire for Improvement Resilience Social Support  ADL's:  Intact  Cognition:  WNL  Sleep:  Number of Hours: 6.5     Treatment Plan Summary: Daily contact with patient to assess and evaluate symptoms and progress in treatment and Medication management. Plan: no changes to current medications. Patient is at baseline, but requires  placement.   Salley Scarlet, MD 05/05/2020, 12:02 PM

## 2020-05-05 NOTE — Progress Notes (Signed)
Patient is calm alert and eager to engage in conversation.   He denies SI  HI  AVH and pain. He continues to endorse depression and anxiety and states that his prolonged stay her continuous to be his biggest stressor.  He received his prescribed medication and tolerated without incident. He remains safe on the unit with 15 minute safety checks and was encouraged to contact staff with any concerns.    Cleo Butler-Nicholson, LPN

## 2020-05-05 NOTE — Plan of Care (Signed)
Pt denies depression, anxiety, SI, HI and AVH. Pt was educated on care plan and verbalizes understanding. Alexander Bullock RN Problem: Education: Goal: Knowledge of [place] [facility] Education information/materials will improve Outcome: Progressing Goal: Emotional status will improve Outcome: Progressing Goal: Mental status will improve Outcome: Progressing Goal: Verbalization of understanding the information provided will improve Outcome: Progressing   Problem: Activity: Goal: Interest or engagement in activities will improve Outcome: Progressing Goal: Sleeping patterns will improve Outcome: Progressing   Problem: Coping: Goal: Ability to verbalize frustrations and anger appropriately will improve Outcome: Progressing Goal: Ability to demonstrate self-control will improve Outcome: Progressing   Problem: Health Behavior/Discharge Planning: Goal: Identification of resources available to assist in meeting health care needs will improve Outcome: Progressing Goal: Compliance with treatment plan for underlying cause of condition will improve Outcome: Progressing   Problem: Physical Regulation: Goal: Ability to maintain clinical measurements within normal limits will improve Outcome: Progressing   Problem: Safety: Goal: Periods of time without injury will increase Outcome: Progressing   Problem: Education: Goal: Knowledge of [place] [facility] Education information/materials will improve Outcome: Progressing Goal: Emotional status will improve Outcome: Progressing Goal: Mental status will improve Outcome: Progressing Goal: Verbalization of understanding the information provided will improve Outcome: Progressing   Problem: Activity: Goal: Interest or engagement in activities will improve Outcome: Progressing Goal: Sleeping patterns will improve Outcome: Progressing   Problem: Coping: Goal: Ability to verbalize frustrations and anger appropriately will improve Outcome:  Progressing Goal: Ability to demonstrate self-control will improve Outcome: Progressing   Problem: Health Behavior/Discharge Planning: Goal: Identification of resources available to assist in meeting health care needs will improve Outcome: Progressing Goal: Compliance with treatment plan for underlying cause of condition will improve Outcome: Progressing   Problem: Physical Regulation: Goal: Ability to maintain clinical measurements within normal limits will improve Outcome: Progressing   Problem: Safety: Goal: Periods of time without injury will increase Outcome: Progressing   Problem: Education: Goal: Knowledge of [place] [facility] Education information/materials will improve Outcome: Progressing Goal: Emotional status will improve Outcome: Progressing Goal: Mental status will improve Outcome: Progressing Goal: Verbalization of understanding the information provided will improve Outcome: Progressing   Problem: Activity: Goal: Interest or engagement in activities will improve Outcome: Progressing Goal: Sleeping patterns will improve Outcome: Progressing   Problem: Coping: Goal: Ability to verbalize frustrations and anger appropriately will improve Outcome: Progressing Goal: Ability to demonstrate self-control will improve Outcome: Progressing   Problem: Health Behavior/Discharge Planning: Goal: Identification of resources available to assist in meeting health care needs will improve Outcome: Progressing Goal: Compliance with treatment plan for underlying cause of condition will improve Outcome: Progressing   Problem: Physical Regulation: Goal: Ability to maintain clinical measurements within normal limits will improve Outcome: Progressing   Problem: Safety: Goal: Periods of time without injury will increase Outcome: Progressing   Problem: Education: Goal: Ability to make informed decisions regarding treatment will improve Outcome: Progressing   Problem:  Coping: Goal: Coping ability will improve Outcome: Progressing   Problem: Medication: Goal: Compliance with prescribed medication regimen will improve Outcome: Progressing   Problem: Self-Concept: Goal: Ability to disclose and discuss suicidal ideas will improve Outcome: Progressing Goal: Will verbalize positive feelings about self Outcome: Progressing   Problem: Coping: Goal: Coping ability will improve Outcome: Progressing Goal: Will verbalize feelings Outcome: Progressing   Problem: Education: Goal: Utilization of techniques to improve thought processes will improve Outcome: Progressing Goal: Knowledge of the prescribed therapeutic regimen will improve Outcome: Progressing   Problem: Activity: Goal:  Interest or engagement in leisure activities will improve Outcome: Progressing Goal: Imbalance in normal sleep/wake cycle will improve Outcome: Progressing   Problem: Coping: Goal: Coping ability will improve Outcome: Progressing Goal: Will verbalize feelings Outcome: Progressing   Problem: Health Behavior/Discharge Planning: Goal: Ability to make decisions will improve Outcome: Progressing Goal: Compliance with therapeutic regimen will improve Outcome: Progressing   Problem: Role Relationship: Goal: Will demonstrate positive changes in social behaviors and relationships Outcome: Progressing   Problem: Safety: Goal: Ability to disclose and discuss suicidal ideas will improve Outcome: Progressing Goal: Ability to identify and utilize support systems that promote safety will improve Outcome: Progressing   Problem: Self-Concept: Goal: Will verbalize positive feelings about self Outcome: Progressing Goal: Level of anxiety will decrease Outcome: Progressing   Problem: Education: Goal: Utilization of techniques to improve thought processes will improve Outcome: Progressing Goal: Knowledge of the prescribed therapeutic regimen will improve Outcome: Progressing    Problem: Activity: Goal: Interest or engagement in leisure activities will improve Outcome: Progressing Goal: Imbalance in normal sleep/wake cycle will improve Outcome: Progressing   Problem: Coping: Goal: Coping ability will improve Outcome: Progressing Goal: Will verbalize feelings Outcome: Progressing   Problem: Health Behavior/Discharge Planning: Goal: Ability to make decisions will improve Outcome: Progressing Goal: Compliance with therapeutic regimen will improve Outcome: Progressing   Problem: Role Relationship: Goal: Will demonstrate positive changes in social behaviors and relationships Outcome: Progressing   Problem: Safety: Goal: Ability to disclose and discuss suicidal ideas will improve Outcome: Progressing Goal: Ability to identify and utilize support systems that promote safety will improve Outcome: Progressing   Problem: Self-Concept: Goal: Will verbalize positive feelings about self Outcome: Progressing Goal: Level of anxiety will decrease Outcome: Progressing

## 2020-05-05 NOTE — Progress Notes (Signed)
Patientcalm and pleasant this evening denying SI/HI/AVH. Patient observed interacting appropriately with staff and peers on the unit.Patient given support and encouragement. Patient compliant with medication administration per MD orders. Patient being monitored Q 15 minutes for safety per unit protocol. Pt remains safe on the unit.

## 2020-05-05 NOTE — [facility] Group Notes (Signed)
LCSW Group Therapy Note  05/05/2020 3:31 PM  Type of Therapy/Topic:  Group Therapy:  Feelings about Diagnosis  Participation Level:  Minimal   Description of Group:   This group will allow patients to explore their thoughts and feelings about diagnoses they have received. Patients will be guided to explore their level of understanding and acceptance of these diagnoses. Facilitator will encourage patients to process their thoughts and feelings about the reactions of others to their diagnosis and will guide patients in identifying ways to discuss their diagnosis with significant others in their lives. This group will be process-oriented, with patients participating in exploration of their own experiences, giving and receiving support, and processing challenge from other group members.   Therapeutic Goals: 1. Patient will demonstrate understanding of diagnosis as evidenced by identifying two or more symptoms of the disorder 2. Patient will be able to express two feelings regarding the diagnosis 3. Patient will demonstrate their ability to communicate their needs through discussion and/or role play  Summary of Patient Progress: Patient was present in group.  Patient engaged in discussion at beginning of group, however did not fully participate in discussion.  Patient, however, did  Display insight into why cognitive distortions are negative and can inhibit effective communication.   Therapeutic Modalities:   Cognitive Behavioral Therapy Brief Therapy Feelings Identification   Assunta Curtis, MSW, LCSW 05/05/2020 3:31 PM

## 2020-05-06 DIAGNOSIS — F0391 Unspecified dementia with behavioral disturbance: Secondary | ICD-10-CM | POA: Diagnosis not present

## 2020-05-06 NOTE — [facility] Group Notes (Signed)
LCSW Group Therapy Note  05/06/2020 4:24 PM  Type of Therapy/Topic:  Group Therapy:  Emotion Regulation  Participation Level:  Active   Description of Group:   The purpose of this group is to assist patients in learning to regulate negative emotions and experience positive emotions. Patients will be guided to discuss ways in which they have been vulnerable to their negative emotions. These vulnerabilities will be juxtaposed with experiences of positive emotions or situations, and patients will be challenged to use positive emotions to combat negative ones. Special emphasis will be placed on coping with negative emotions in conflict situations, and patients will process healthy conflict resolution skills.  Therapeutic Goals: 1. Patient will identify two positive emotions or experiences to reflect on in order to balance out negative emotions 2. Patient will label two or more emotions that they find the most difficult to experience 3. Patient will demonstrate positive conflict resolution skills through discussion and/or role plays  Summary of Patient Progress: Patient participated in the group process and even volunteered to read from the worksheet given out during class. However, his comments were off topic and it took a few attempts to redirect him back to the topic.   Therapeutic Modalities:   Cognitive Behavioral Therapy Feelings Identification Dialectical Behavioral Therapy  Chalmers Guest. [name], MSW, [place], [place] 05/06/2020 4:24 PM

## 2020-05-06 NOTE — Plan of Care (Signed)
Patient presents at his baseline  Problem: Education: Goal: Emotional status will improve Outcome: Not Progressing Goal: Mental status will improve Outcome: Not Progressing   

## 2020-05-06 NOTE — Progress Notes (Signed)
D- Patient alert and oriented. Affect/mood is calm and cooperative. . Pt denies SI, HI, AVH, and pain. Pt was social and went to all groups today. Pt had a meeting with a potential group home but was not upset that he was not going to have as many "privelages" as he hoped for.   A- Scheduled medications administered to patient, per MD orders. Support and encouragement provided.  Routine safety checks conducted every 15 minutes.  Patient informed to notify staff with problems or concerns.  R- No adverse drug reactions noted. Patient contracts for safety at this time. Patient compliant with medications and treatment plan. Patient receptive, calm, and cooperative. Patient interacts well with others on the unit.  Patient remains safe at this time.  Collier Bullock RN

## 2020-05-06 NOTE — Plan of Care (Signed)
Pt denies depression, anxiety, SI, HI and AVH. Pt was educated on care plan and verbalizes understanding. Collier Bullock RN Problem: Education: Goal: Knowledge of [facility] Education information/materials will improve Outcome: Progressing Goal: Emotional status will improve Outcome: Progressing Goal: Mental status will improve Outcome: Progressing Goal: Verbalization of understanding the information provided will improve Outcome: Progressing   Problem: Activity: Goal: Interest or engagement in activities will improve Outcome: Progressing Goal: Sleeping patterns will improve Outcome: Progressing   Problem: Coping: Goal: Ability to verbalize frustrations and anger appropriately will improve Outcome: Progressing Goal: Ability to demonstrate self-control will improve Outcome: Progressing   Problem: Health Behavior/Discharge Planning: Goal: Identification of resources available to assist in meeting health care needs will improve Outcome: Progressing Goal: Compliance with treatment plan for underlying cause of condition will improve Outcome: Progressing   Problem: Physical Regulation: Goal: Ability to maintain clinical measurements within normal limits will improve Outcome: Progressing   Problem: Safety: Goal: Periods of time without injury will increase Outcome: Progressing   Problem: Education: Goal: Knowledge of [facility] Education information/materials will improve Outcome: Progressing Goal: Emotional status will improve Outcome: Progressing Goal: Mental status will improve Outcome: Progressing Goal: Verbalization of understanding the information provided will improve Outcome: Progressing   Problem: Activity: Goal: Interest or engagement in activities will improve Outcome: Progressing Goal: Sleeping patterns will improve Outcome: Progressing   Problem: Coping: Goal: Ability to verbalize frustrations and anger appropriately will improve Outcome:  Progressing Goal: Ability to demonstrate self-control will improve Outcome: Progressing   Problem: Health Behavior/Discharge Planning: Goal: Identification of resources available to assist in meeting health care needs will improve Outcome: Progressing Goal: Compliance with treatment plan for underlying cause of condition will improve Outcome: Progressing   Problem: Physical Regulation: Goal: Ability to maintain clinical measurements within normal limits will improve Outcome: Progressing   Problem: Safety: Goal: Periods of time without injury will increase Outcome: Progressing   Problem: Education: Goal: Knowledge of [facility] Education information/materials will improve Outcome: Progressing Goal: Emotional status will improve Outcome: Progressing Goal: Mental status will improve Outcome: Progressing Goal: Verbalization of understanding the information provided will improve Outcome: Progressing   Problem: Activity: Goal: Interest or engagement in activities will improve Outcome: Progressing Goal: Sleeping patterns will improve Outcome: Progressing   Problem: Coping: Goal: Ability to verbalize frustrations and anger appropriately will improve Outcome: Progressing Goal: Ability to demonstrate self-control will improve Outcome: Progressing   Problem: Health Behavior/Discharge Planning: Goal: Identification of resources available to assist in meeting health care needs will improve Outcome: Progressing Goal: Compliance with treatment plan for underlying cause of condition will improve Outcome: Progressing   Problem: Physical Regulation: Goal: Ability to maintain clinical measurements within normal limits will improve Outcome: Progressing   Problem: Safety: Goal: Periods of time without injury will increase Outcome: Progressing   Problem: Education: Goal: Knowledge of  General Education information/materials will improve Outcome:  Progressing Goal: Emotional status will improve Outcome: Progressing Goal: Mental status will improve Outcome: Progressing Goal: Verbalization of understanding the information provided will improve Outcome: Progressing   Problem: Activity: Goal: Interest or engagement in activities will improve Outcome: Progressing Goal: Sleeping patterns will improve Outcome: Progressing   Problem: Coping: Goal: Ability to verbalize frustrations and anger appropriately will improve Outcome: Progressing Goal: Ability to demonstrate self-control will improve Outcome: Progressing   Problem: Health Behavior/Discharge Planning: Goal: Identification of resources available to assist in meeting health care needs will improve Outcome: Progressing Goal: Compliance with treatment plan for  underlying cause of condition will improve Outcome: Progressing   Problem: Physical Regulation: Goal: Ability to maintain clinical measurements within normal limits will improve Outcome: Progressing   Problem: Safety: Goal: Periods of time without injury will increase Outcome: Progressing   Problem: Education: Goal: Ability to make informed decisions regarding treatment will improve Outcome: Progressing   Problem: Coping: Goal: Coping ability will improve Outcome: Progressing   Problem: Health Behavior/Discharge Planning: Goal: Identification of resources available to assist in meeting health care needs will improve Outcome: Progressing   Problem: Medication: Goal: Compliance with prescribed medication regimen will improve Outcome: Progressing   Problem: Self-Concept: Goal: Ability to disclose and discuss suicidal ideas will improve Outcome: Progressing Goal: Will verbalize positive feelings about self Outcome: Progressing

## 2020-05-06 NOTE — Progress Notes (Signed)
Patientcalm and pleasant this evening denying SI/HI/AVH. Patient observed interacting appropriately with staff and peers on the unit.Patient given support and encouragement. Patient compliant with medication administration per MD orders. Patient being monitored Q 15 minutes for safety per unit protocol. Pt remains safe on the unit.  Patient became agitated and irritable after medication administration. Patient is upset about the possibility of going to a group home. Patient states he can't go there, he wants his old life back. Patient given support and was redirected to his room. Patient calmed down and was compliant with using his coping skills.

## 2020-05-06 NOTE — Progress Notes (Signed)
Recreation Therapy Notes   Date: 05/06/2020  Time: 9:30 am   Location: Craft room   Behavioral response: Appropriate  Intervention Topic: Happiness   Discussion/Intervention:  Group content today was focused on Happiness. The group defined happiness and described where happiness comes from. Individuals identified what makes them happy and how they go about making others happy. Patients expressed things that stop them from being happy and ways they can improve their happiness. The group stated reasons why it is important to be happy. The group participated in the intervention "My Happiness", where they had a chance to identify and express things that make them happy.   Clinical Observations/Feedback: Patient came to group and defined happiness as helping others and feeling good about yourself. He stated that serving the community is what makes him happy. Participant express that rules, laws and disappointment is what makes him unhappy. Patient explained that things have been getting in the way of his accomplishment and that also hinders his happiness.  Individual was social with peers and staff while participating in the intervention.  Cagney Steenson LRT/CTRS          Kahmari Koller 05/06/2020 1:55 PM

## 2020-05-06 NOTE — Progress Notes (Signed)
[facility] MD Progress Note  05/06/2020 2:12 PM BENITO LEMMERMAN  MRN:  [id number]   Subjective:  Ms. Elwood seen one-on-one in office.  He had no behavioral issues overnight or today.  He denies suicidal ideations, homicidal ideations, visual hallucinations, or auditory hallucinations.  He notes that he is upset that no one will accept him to a group home based on past behavior of running away. He feels he has been able to learn how to cope with his panic attacks well enough to not do this in the future. He continues to focus on volunteering in the community. He also hopes to play the game corn hole in the courtyard with the nursing students today.   Principal Problem: Dementia ([place]) Diagnosis: Principal Problem:   Dementia ([place]) Active Problems:   Schizoaffective disorder, bipolar type ([place])   Active autistic disorder   Diabetes ([place])   Hypertension   Prostate hypertrophy   Intellectual disability   At risk for elopement  Total Time spent with patient: 30 minutes  Past Psychiatric History: Past history of longstanding impairment see many previous notes  Past Medical History:  Past Medical History:  Diagnosis Date  . Anxiety   . Anxiety disorder due to known physiological condition    HOSPITALIZED 10/18  . Arthritis   . Autistic disorder, residual state   . COPD (chronic obstructive pulmonary disease) ([place])   . Depression   . Developmental disorder   . Diabetes mellitus without complication ([place])   . Dyslipidemia   . Esophageal reflux   . HOH (hard of hearing)    MILDLY  . Hypertension   . Obesity   . Overactive bladder   . Palpitations    ANXIETY  . Schizophrenia, schizoaffective ([place])   . Sleep apnea   . Tremors of nervous system    HANDS DUE TO MEDICATIONS  . Urinary incontinence     Past Surgical History:  Procedure Laterality Date  . CATARACT EXTRACTION W/PHACO Left 11/14/2017   Procedure: CATARACT EXTRACTION PHACO AND INTRAOCULAR LENS PLACEMENT (IOC);  Surgeon:  Birder Robson, MD;  Location: [facility] ORS;  Service: Ophthalmology;  Laterality: Left;  [place] 00:42 AP% 14.6 CDE 6.12 Fluid pack lot # [id number]  . COLONOSCOPY  01/28/2005  . COLONOSCOPY WITH PROPOFOL N/A 05/09/2016   Procedure: COLONOSCOPY WITH PROPOFOL;  Surgeon: Manya Silvas, MD;  Location: [facility] ENDOSCOPY;  Service: Endoscopy;  Laterality: N/A;  . ESOPHAGOGASTRODUODENOSCOPY N/A 05/09/2016   Procedure: ESOPHAGOGASTRODUODENOSCOPY (EGD);  Surgeon: Manya Silvas, MD;  Location: [facility] ENDOSCOPY;  Service: Endoscopy;  Laterality: N/A;  . FRACTURE SURGERY     ORIF SHOULDER  . TONSILLECTOMY     Family History:  Family History  Problem Relation Age of Onset  . Hypertension Mother   . Stroke Father   . Heart Problems Father    Family Psychiatric  History: see previous Social History:  Social History   Substance and Sexual Activity  Alcohol Use No  . Alcohol/week: 0.0 standard drinks     Social History   Substance and Sexual Activity  Drug Use No    Social History   Socioeconomic History  . Marital status: Single    Spouse name: Not on file  . Number of children: Not on file  . Years of education: Not on file  . Highest education level: Not on file  Occupational History  . Not on file  Tobacco Use  . Smoking status: Never Smoker  . Smokeless tobacco: Never Used  Vaping Use  .  Vaping Use: Never used  Substance and Sexual Activity  . Alcohol use: No    Alcohol/week: 0.0 standard drinks  . Drug use: No  . Sexual activity: Never  Other Topics Concern  . Not on file  Social History Narrative   The patient never finished high school but did get his GED. 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 He is currently in disability and [name] is his legal guardian. He has been living in group homes for many years.      No pending legal charges   Social Determinants of Health   Financial Resource Strain:    . Difficulty of Paying Living Expenses: Not on file  Food Insecurity:   . Worried About Charity fundraiser in the Last Year: Not on file  . Ran Out of Food in the Last Year: Not on file  Transportation Needs:   . Lack of Transportation (Medical): Not on file  . Lack of Transportation (Non-Medical): Not on file  Physical Activity:   . Days of Exercise per Week: Not on file  . Minutes of Exercise per Session: Not on file  Stress:   . Feeling of Stress : Not on file  Social Connections:   . Frequency of Communication with Friends and Family: Not on file  . Frequency of Social Gatherings with Friends and Family: Not on file  . Attends Religious Services: Not on file  . Active Member of Clubs or Organizations: Not on file  . Attends Archivist Meetings: Not on file  . Marital Status: Not on file   Additional Social History:                         Sleep: Good  Appetite:  Good  Current Medications: Current Facility-Administered Medications  Medication Dose Route Frequency Provider Last Rate Last Admin  . acetaminophen (TYLENOL) tablet 650 mg  650 mg Oral Q6H PRN [name], MD      . alum & mag hydroxide-simeth (MAALOX/MYLANTA) 200-200-20 MG/5ML suspension 30 mL  30 mL Oral Q4H PRN [name], MD      . aspirin EC tablet 81 mg  81 mg Oral Daily [name], [name], MD   81 mg at 05/06/20 0803  . clonazePAM (KLONOPIN) tablet 1 mg  1 mg Oral QID [name], [name], MD   1 mg at 05/06/20 1219  . divalproex (DEPAKOTE ER) 24 hr tablet 1,500 mg  1,500 mg Oral QHS [name], [name], MD   1,500 mg at 05/05/20 2130  . gabapentin (NEURONTIN) capsule 100 mg  100 mg Oral TID [name], [name], MD   100 mg at 05/06/20 1219  . hydrOXYzine (ATARAX/VISTARIL) tablet 10 mg  10 mg Oral TID PRN [name], MD   10 mg at 04/20/20 2128  . influenza vac split quadrivalent PF (FLUARIX) injection 0.5 mL  0.5 mL Intramuscular Tomorrow-1000 [name], [name], MD      . lamoTRIgine  (LAMICTAL) tablet 100 mg  100 mg Oral QHS [name], [name], MD   100 mg at 05/05/20 2130  . linagliptin (TRADJENTA) tablet 5 mg  5 mg Oral Daily [name], [name], MD   5 mg at 05/06/20 0804  . loperamide (IMODIUM) capsule 2 mg  2 mg Oral Q4H PRN [name], MD   2 mg at 04/10/20 0917  . loratadine (CLARITIN) tablet 10  mg  10 mg Oral Daily [name], [name], MD   10 mg at 05/06/20 0805  . LORazepam (ATIVAN) tablet 2 mg  2 mg Oral Q6H PRN [name], [name], MD   2 mg at 05/02/20 1211  . magnesium hydroxide (MILK OF MAGNESIA) suspension 30 mL  30 mL Oral Daily PRN [name], MD   30 mL at 03/23/20 1344  . metFORMIN (GLUCOPHAGE) tablet 850 mg  850 mg Oral BID WC [name], [name], MD   850 mg at 05/06/20 0803  . metoprolol succinate (TOPROL-XL) 24 hr tablet 50 mg  50 mg Oral Daily [name], [name], MD   50 mg at 05/06/20 0804  . ondansetron (ZOFRAN) tablet 4 mg  4 mg Oral Q8H PRN [name], [name], MD   4 mg at 04/30/20 2035  . oxybutynin (DITROPAN) tablet 10 mg  10 mg Oral BID [name], [name], MD   10 mg at 05/06/20 0805  . pantoprazole (PROTONIX) EC tablet 40 mg  40 mg Oral Daily [name], [name], MD   40 mg at 05/06/20 0804  . prazosin (MINIPRESS) capsule 1 mg  1 mg Oral QHS [name], [name], MD   1 mg at 05/04/20 2124  . QUEtiapine (SEROQUEL) tablet 100 mg  100 mg Oral QID [name], [name], MD   100 mg at 05/06/20 1219  . QUEtiapine (SEROQUEL) tablet 400 mg  400 mg Oral QHS [name], NP   400 mg at 05/05/20 2130  . sertraline (ZOLOFT) tablet 50 mg  50 mg Oral BID [name], [name], MD   50 mg at 05/06/20 0805  . simethicone (MYLICON) chewable tablet 80 mg  80 mg Oral TID [name], [name], MD   80 mg at 05/06/20 1218  . simvastatin (ZOCOR) tablet 20 mg  20 mg Oral q1800 [name], [name], MD   20 mg at 05/05/20 1630  . tamsulosin (FLOMAX) capsule 0.8 mg  0.8 mg Oral QPC supper [name], [name], MD   0.8 mg at 05/05/20 1632    Lab Results:  No results found for this or any previous visit (from  the past 48 hour(s)).  Blood Alcohol level:  Lab Results  Component Value Date   ETH <10 01/22/2020   ETH <10 13/02/6577    Metabolic Disorder Labs: Lab Results  Component Value Date   HGBA1C 6.6 (H) 02/20/2020   MPG 142.72 02/20/2020   No results found for: PROLACTIN Lab Results  Component Value Date   CHOL 153 04/10/2015   TRIG 166 (H) 04/10/2015   HDL 50 04/10/2015   CHOLHDL 3.1 04/10/2015   VLDL 33 04/10/2015   LDLCALC 70 04/10/2015    Physical Findings: AIMS: Facial and Oral Movements Muscles of Facial Expression: None, normal Lips and Perioral Area: None, normal Jaw: None, normal Tongue: None, normal,Extremity Movements Upper (arms, wrists, hands, fingers): None, normal Lower (legs, knees, ankles, toes): None, normal, Trunk Movements Neck, shoulders, hips: None, normal, Overall Severity Severity of abnormal movements (highest score from questions above): None, normal Incapacitation due to abnormal movements: None, normal Patient's awareness of abnormal movements (rate only patient's report): No Awareness, Dental Status Current problems with teeth and/or dentures?: No Does patient usually wear dentures?: No  CIWA:    COWS:     Musculoskeletal: Strength & Muscle Tone: within normal limits Gait & Station: normal Patient leans: N/A  Psychiatric Specialty Exam: Physical Exam Vitals and nursing note reviewed.  Constitutional:      Appearance: Normal  appearance.  HENT:     Head: Normocephalic and atraumatic.     Right Ear: External ear normal.     Left Ear: External ear normal.     Nose: Nose normal.     Mouth/Throat:     Mouth: Mucous membranes are moist.     Pharynx: Oropharynx is clear.  Eyes:     Extraocular Movements: Extraocular movements intact.     Conjunctiva/sclera: Conjunctivae normal.     Pupils: Pupils are equal, round, and reactive to light.  Cardiovascular:     Rate and Rhythm: Normal rate.     Pulses: Normal pulses.  Pulmonary:      Effort: Pulmonary effort is normal.     Breath sounds: Normal breath sounds.  Abdominal:     General: Abdomen is flat.     Palpations: Abdomen is soft.  Musculoskeletal:        General: No swelling. Normal range of motion.     Cervical back: Normal range of motion and neck supple.  Skin:    General: Skin is warm and dry.  Neurological:     General: No focal deficit present.     Mental Status: He is alert. Mental status is at baseline.  Psychiatric:        Mood and Affect: Mood normal.        Behavior: Behavior normal.        Thought Content: Thought content normal.        Judgment: Judgment normal.     Review of Systems  Constitutional: Negative for appetite change and fatigue.  HENT: Negative for rhinorrhea and sore throat.   Eyes: Negative for photophobia and visual disturbance.  Respiratory: Negative for cough and shortness of breath.   Cardiovascular: Negative for chest pain and palpitations.  Gastrointestinal: Negative for constipation, diarrhea, nausea and vomiting.  Endocrine: Negative for cold intolerance and heat intolerance.  Genitourinary: Negative for difficulty urinating and dysuria.  Musculoskeletal: Negative for back pain and neck stiffness.  Skin: Negative for rash and wound.  Allergic/Immunologic: Negative for food allergies and immunocompromised state.  Neurological: Negative for dizziness and headaches.  Hematological: Negative for adenopathy. Does not bruise/bleed easily.  Psychiatric/Behavioral: Negative for hallucinations and suicidal ideas.    Blood pressure 134/78, pulse 63, temperature 98.6 F (37 C), temperature source Oral, resp. rate 18, height 5\' 8"  (1.727 m), weight 104.3 kg, SpO2 97 %.Body mass index is 34.96 kg/m.  General Appearance: Casual  Eye Contact:  Good  Speech:  Clear and Coherent  Volume:  Normal  Mood:  Hopeless  Affect:  Congruent  Thought Process:  Coherent  Orientation:  Full (Time, Place, and Person)  Thought Content:   Logical  Suicidal Thoughts:  No  Homicidal Thoughts:  No  Memory:  Immediate;   Fair Recent;   Fair Remote;   Fair  Judgement:  Fair  Insight:  Fair  Psychomotor Activity:  Normal  Concentration:  Concentration: Fair and Attention Span: Fair  Recall:  [employer] of Knowledge:  Fair  Language:  Fair  Akathisia:  Negative  Handed:  Right  AIMS (if indicated):     Assets:  Communication Skills Desire for Improvement Resilience Social Support  ADL's:  Intact  Cognition:  WNL  Sleep:  Number of Hours: 7     Treatment Plan Summary: Daily contact with patient to assess and evaluate symptoms and progress in treatment and Medication management. Plan: no changes to current medications. Patient is at baseline, but requires  placement.   Salley Scarlet, MD 05/06/2020, 2:12 PM

## 2020-05-06 NOTE — [facility] Group Notes (Signed)
[facility] Group Notes:  (Nursing/MHT/Case Management/Adjunct)  Date:  05/06/2020  Time:  9:14 PM  Type of Therapy:  Group Therapy  Participation Level:  Active  Participation Quality:  Sharing  Affect:  Appropriate  Cognitive:  Alert  Insight:  Good  Engagement in Group:  Engaged and he has a opportunity to go to group home and he don't like rules,may feel upset,trap there because can't go no where because of the covid and he heed to get out of here to be free he want old life back like getting out.  Modes of Intervention:  Support  Summary of Progress/Problems:  Alexander Beard 05/06/2020, 9:14 PM

## 2020-05-06 NOTE — [facility] Counselor (Signed)
CSW attempted to contact [name],  [phone].  CSW left HIPAA compliant voicemail.  Assunta Curtis, MSW, LCSW 05/06/2020 2:17 PM

## 2020-05-06 NOTE — [facility] Counselor (Signed)
CSW assisted the patient in completing a interview with Scot Jun with [facility].   Interview went well.  He reports that he will follow up with this CSW the following day.  Assunta Curtis, MSW, LCSW 05/06/2020 4:37 PM

## 2020-05-07 DIAGNOSIS — F0391 Unspecified dementia with behavioral disturbance: Secondary | ICD-10-CM | POA: Diagnosis not present

## 2020-05-07 NOTE — Progress Notes (Signed)
Recreation Therapy Notes   Date: 05/07/2020  Time: 9:30 am   Location: Craft room  Behavioral response: Appropriate  Intervention Topic: Communication   Discussion/Intervention:  Group content today was focused on communication. The group defined communication and ways to communicate with others. Individuals stated reason why communication is important and some reasons to communicate with others. Patients expressed if they thought they were good at communicating with others and ways they could improve their communication skills. The group identified important parts of communication and some experiences they have had in the past with communication. The group participated in the intervention "Words in a Bag", where they had a chance to test out their communication skills and identify ways to improve their communication techniques.   Clinical Observations/Feedback: Patient came to group and defined communication as speaking back to others on the same subject. He expressed that communication is important to share opinions and learn from one another. Individual was social with peers and staff while participating in the intervention.  Yaslyn Cumby LRT/CTRS         Marla Pouliot 05/07/2020 12:17 PM

## 2020-05-07 NOTE — Plan of Care (Signed)
D- Patient alert and oriented. Patient presented in an anxious, but pleasant mood on assessment stating that he slept good last night and had no complaints to voice to this Probation officer. Patient denied depression, however, he reported that he is "worried about going to the place that they offered me yesterday, and not being able to do the things that I like to do". Patient denied SI, HI, AVH, and pain at this time. Patient's goal for today is to "enjoy yourself, don't worry", in which he will "stay calm, cool and collected. Find a better place to live", in order to achieve his goal.  A- Scheduled medications administered to patient, per MD orders. Support and encouragement provided.  Routine safety checks conducted every 15 minutes.  Patient informed to notify staff with problems or concerns.  R- No adverse drug reactions noted. Patient contracts for safety at this time. Patient compliant with medications and treatment plan. Patient receptive, calm, and cooperative. Patient interacts well with others on the unit.  Patient remains safe at this time.  Problem: Education: Goal: Knowledge of [place] [facility] Education information/materials will improve Outcome: Progressing Goal: Emotional status will improve Outcome: Progressing Goal: Mental status will improve Outcome: Progressing Goal: Verbalization of understanding the information provided will improve Outcome: Progressing   Problem: Activity: Goal: Interest or engagement in activities will improve Outcome: Progressing Goal: Sleeping patterns will improve Outcome: Progressing   Problem: Coping: Goal: Ability to verbalize frustrations and anger appropriately will improve Outcome: Progressing Goal: Ability to demonstrate self-control will improve Outcome: Progressing   Problem: Health Behavior/Discharge Planning: Goal: Identification of resources available to assist in meeting health care needs will improve Outcome: Progressing Goal:  Compliance with treatment plan for underlying cause of condition will improve Outcome: Progressing   Problem: Physical Regulation: Goal: Ability to maintain clinical measurements within normal limits will improve Outcome: Progressing   Problem: Safety: Goal: Periods of time without injury will increase Outcome: Progressing   Problem: Education: Goal: Ability to make informed decisions regarding treatment will improve Outcome: Progressing   Problem: Coping: Goal: Coping ability will improve Outcome: Progressing   Problem: Health Behavior/Discharge Planning: Goal: Identification of resources available to assist in meeting health care needs will improve Outcome: Progressing   Problem: Medication: Goal: Compliance with prescribed medication regimen will improve Outcome: Progressing   Problem: Self-Concept: Goal: Ability to disclose and discuss suicidal ideas will improve Outcome: Progressing Goal: Will verbalize positive feelings about self Outcome: Progressing   Problem: Education: Goal: Knowledge of [place] [facility] Education information/materials will improve Outcome: Progressing Goal: Emotional status will improve Outcome: Progressing Goal: Mental status will improve Outcome: Progressing Goal: Verbalization of understanding the information provided will improve Outcome: Progressing   Problem: Activity: Goal: Interest or engagement in activities will improve Outcome: Progressing Goal: Sleeping patterns will improve Outcome: Progressing   Problem: Coping: Goal: Ability to verbalize frustrations and anger appropriately will improve Outcome: Progressing Goal: Ability to demonstrate self-control will improve Outcome: Progressing   Problem: Health Behavior/Discharge Planning: Goal: Identification of resources available to assist in meeting health care needs will improve Outcome: Progressing Goal: Compliance with treatment plan for underlying cause of condition  will improve Outcome: Progressing   Problem: Physical Regulation: Goal: Ability to maintain clinical measurements within normal limits will improve Outcome: Progressing   Problem: Safety: Goal: Periods of time without injury will increase Outcome: Progressing   Problem: Education: Goal: Knowledge of [place] [facility] Education information/materials will improve Outcome: Progressing Goal: Emotional status will improve Outcome: Progressing  Goal: Mental status will improve Outcome: Progressing Goal: Verbalization of understanding the information provided will improve Outcome: Progressing   Problem: Activity: Goal: Interest or engagement in activities will improve Outcome: Progressing Goal: Sleeping patterns will improve Outcome: Progressing   Problem: Coping: Goal: Ability to verbalize frustrations and anger appropriately will improve Outcome: Progressing Goal: Ability to demonstrate self-control will improve Outcome: Progressing   Problem: Health Behavior/Discharge Planning: Goal: Identification of resources available to assist in meeting health care needs will improve Outcome: Progressing Goal: Compliance with treatment plan for underlying cause of condition will improve Outcome: Progressing   Problem: Physical Regulation: Goal: Ability to maintain clinical measurements within normal limits will improve Outcome: Progressing   Problem: Safety: Goal: Periods of time without injury will increase Outcome: Progressing   Problem: Education: Goal: Knowledge of [place] [facility] Education information/materials will improve Outcome: Progressing Goal: Emotional status will improve Outcome: Progressing Goal: Mental status will improve Outcome: Progressing Goal: Verbalization of understanding the information provided will improve Outcome: Progressing   Problem: Activity: Goal: Interest or engagement in activities will improve Outcome: Progressing Goal: Sleeping  patterns will improve Outcome: Progressing   Problem: Coping: Goal: Ability to verbalize frustrations and anger appropriately will improve Outcome: Progressing Goal: Ability to demonstrate self-control will improve Outcome: Progressing   Problem: Health Behavior/Discharge Planning: Goal: Identification of resources available to assist in meeting health care needs will improve Outcome: Progressing Goal: Compliance with treatment plan for underlying cause of condition will improve Outcome: Progressing   Problem: Physical Regulation: Goal: Ability to maintain clinical measurements within normal limits will improve Outcome: Progressing   Problem: Safety: Goal: Periods of time without injury will increase Outcome: Progressing

## 2020-05-07 NOTE — [facility] Group Notes (Signed)
LCSW Group Therapy Note  05/07/2020 2:39 PM  Type of Therapy/Topic:  Group Therapy:  Balance in Life  Participation Level:  Minimal  Description of Group:    This group will address the concept of balance and how it feels and looks when one is unbalanced. Patients will be encouraged to process areas in their lives that are out of balance and identify reasons for remaining unbalanced. Facilitators will guide patients in utilizing problem-solving interventions to address and correct the stressor making their life unbalanced. Understanding and applying boundaries will be explored and addressed for obtaining and maintaining a balanced life. Patients will be encouraged to explore ways to assertively make their unbalanced needs known to significant others in their lives, using other group members and facilitator for support and feedback.  Therapeutic Goals: 1. Patient will identify two or more emotions or situations they have that consume much of in their lives. 2. Patient will identify signs/triggers that life has become out of balance:  3. Patient will identify two ways to set boundaries in order to achieve balance in their lives:  4. Patient will demonstrate ability to communicate their needs through discussion and/or role plays  Summary of Patient Progress: Patient was present in group.  Patient was quiet and reserved in group in a disposition that is not pt's typical disposition.  Patient did share that he tries to "be a good sport".  He reports that he uses this skill to keep balance.  He reports that he uses rest as a coping skill.    Therapeutic Modalities:   Cognitive Behavioral Therapy Solution-Focused Therapy Assertiveness Training  Assunta Curtis MSW, [place] 05/07/2020 2:39 PM

## 2020-05-07 NOTE — Progress Notes (Signed)
Patientcalm and pleasant this evening denying SI/HI/AVH.Patient observed interacting appropriately with staff and peers on the unit.Patient given support and encouragement. Patient compliant with medication administration per MD orders. Patient being monitored Q 15 minutes for safety per unit protocol. Pt remains safe on the unit.Patient irritable and agitated about having to possibly go to a group home and he is afraid that he won't be able to enjoy life like he did before. Patient given support and encouragement.

## 2020-05-07 NOTE — BHH Counselor (Signed)
CSW attempted to call to get information on a LOG. CSW requested a return call.  Assunta Curtis, MSW, LCSW 05/07/2020 3:05 PM

## 2020-05-07 NOTE — Plan of Care (Signed)
Patient presents at baseline  Problem: Education: Goal: Emotional status will improve Outcome: Not Progressing Goal: Mental status will improve Outcome: Not Progressing

## 2020-05-07 NOTE — Progress Notes (Signed)
[facility] MD Progress Note  05/07/2020 11:30 AM AIRON Beard  MRN:  [id number]   Subjective:  Alexander Beard seen one-on-one in office.  He had no behavioral issues overnight or today.  He denies suicidal ideations, homicidal ideations, visual hallucinations, or auditory hallucinations. He had an interview with a group home yesterday that seemed to go well. He was excited about it being located near his brother, niece, and Engineer, petroleum he enjoys. He continues to focus on volunteering in the community. He also hopes to play the game corn hole in the courtyard with the nursing students today.   Principal Problem: Dementia ([place]) Diagnosis: Principal Problem:   Dementia ([place]) Active Problems:   Schizoaffective disorder, bipolar type ([place])   Active autistic disorder   Diabetes ([place])   Hypertension   Prostate hypertrophy   Intellectual disability   At risk for elopement  Total Time spent with patient: 30 minutes  Past Psychiatric History: Past history of longstanding impairment see many previous notes  Past Medical History:  Past Medical History:  Diagnosis Date  . Anxiety   . Anxiety disorder due to known physiological condition    HOSPITALIZED 10/18  . Arthritis   . Autistic disorder, residual state   . COPD (chronic obstructive pulmonary disease) ([place])   . Depression   . Developmental disorder   . Diabetes mellitus without complication ([place])   . Dyslipidemia   . Esophageal reflux   . HOH (hard of hearing)    MILDLY  . Hypertension   . Obesity   . Overactive bladder   . Palpitations    ANXIETY  . Schizophrenia, schizoaffective ([place])   . Sleep apnea   . Tremors of nervous system    HANDS DUE TO MEDICATIONS  . Urinary incontinence     Past Surgical History:  Procedure Laterality Date  . CATARACT EXTRACTION W/PHACO Left 11/14/2017   Procedure: CATARACT EXTRACTION PHACO AND INTRAOCULAR LENS PLACEMENT (IOC);  Surgeon: Birder Robson, MD;  Location: [facility] ORS;  Service:  Ophthalmology;  Laterality: Left;  [place] 00:42 AP% 14.6 CDE 6.12 Fluid pack lot # [id number]  . COLONOSCOPY  01/28/2005  . COLONOSCOPY WITH PROPOFOL N/A 05/09/2016   Procedure: COLONOSCOPY WITH PROPOFOL;  Surgeon: Manya Silvas, MD;  Location: [facility] ENDOSCOPY;  Service: Endoscopy;  Laterality: N/A;  . ESOPHAGOGASTRODUODENOSCOPY N/A 05/09/2016   Procedure: ESOPHAGOGASTRODUODENOSCOPY (EGD);  Surgeon: Manya Silvas, MD;  Location: [facility] ENDOSCOPY;  Service: Endoscopy;  Laterality: N/A;  . FRACTURE SURGERY     ORIF SHOULDER  . TONSILLECTOMY     Family History:  Family History  Problem Relation Age of Onset  . Hypertension Mother   . Stroke Father   . Heart Problems Father    Family Psychiatric  History: see previous Social History:  Social History   Substance and Sexual Activity  Alcohol Use No  . Alcohol/week: 0.0 standard drinks     Social History   Substance and Sexual Activity  Drug Use No    Social History   Socioeconomic History  . Marital status: Single    Spouse name: Not on file  . Number of children: Not on file  . Years of education: Not on file  . Highest education level: Not on file  Occupational History  . Not on file  Tobacco Use  . Smoking status: Never Smoker  . Smokeless tobacco: Never Used  Vaping Use  . Vaping Use: Never used  Substance and Sexual Activity  . Alcohol use: No  Alcohol/week: 0.0 standard drinks  . Drug use: No  . Sexual activity: Never  Other Topics Concern  . Not on file  Social History Narrative   The patient never finished high school but did get his GED. 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 He is currently in disability and [name] is his legal guardian. He has been living in group homes for many years.      No pending legal charges   Social Determinants of Health   Financial Resource Strain:   . Difficulty of Paying Living Expenses: Not on file   Food Insecurity:   . Worried About Charity fundraiser in the Last Year: Not on file  . Ran Out of Food in the Last Year: Not on file  Transportation Needs:   . Lack of Transportation (Medical): Not on file  . Lack of Transportation (Non-Medical): Not on file  Physical Activity:   . Days of Exercise per Week: Not on file  . Minutes of Exercise per Session: Not on file  Stress:   . Feeling of Stress : Not on file  Social Connections:   . Frequency of Communication with Friends and Family: Not on file  . Frequency of Social Gatherings with Friends and Family: Not on file  . Attends Religious Services: Not on file  . Active Member of Clubs or Organizations: Not on file  . Attends Archivist Meetings: Not on file  . Marital Status: Not on file   Additional Social History:                         Sleep: Good  Appetite:  Good  Current Medications: Current Facility-Administered Medications  Medication Dose Route Frequency Provider Last Rate Last Admin  . acetaminophen (TYLENOL) tablet 650 mg  650 mg Oral Q6H PRN [name], MD      . alum & mag hydroxide-simeth (MAALOX/MYLANTA) 200-200-20 MG/5ML suspension 30 mL  30 mL Oral Q4H PRN [name], MD      . aspirin EC tablet 81 mg  81 mg Oral Daily [name], [name], MD   81 mg at 05/07/20 1751  . clonazePAM (KLONOPIN) tablet 1 mg  1 mg Oral QID [name], [name], MD   1 mg at 05/07/20 0258  . divalproex (DEPAKOTE ER) 24 hr tablet 1,500 mg  1,500 mg Oral QHS [name], [name], MD   1,500 mg at 05/06/20 2113  . gabapentin (NEURONTIN) capsule 100 mg  100 mg Oral TID [name], [name], MD   100 mg at 05/07/20 5277  . hydrOXYzine (ATARAX/VISTARIL) tablet 10 mg  10 mg Oral TID PRN [name], MD   10 mg at 04/20/20 2128  . influenza vac split quadrivalent PF (FLUARIX) injection 0.5 mL  0.5 mL Intramuscular Tomorrow-1000 [name], [name], MD      . lamoTRIgine (LAMICTAL) tablet 100 mg  100 mg Oral QHS [name], John  T, MD   100 mg at 05/06/20 2114  . linagliptin (TRADJENTA) tablet 5 mg  5 mg Oral Daily [name], [name], MD   5 mg at 05/07/20 8242  . loperamide (IMODIUM) capsule 2 mg  2 mg Oral Q4H PRN [name], MD   2 mg at 04/10/20 0917  . loratadine (CLARITIN) tablet 10 mg  10 mg Oral Daily [name], [name], MD   10 mg at 05/07/20 3536  .  LORazepam (ATIVAN) tablet 2 mg  2 mg Oral Q6H PRN [name], [name], MD   2 mg at 05/02/20 1211  . magnesium hydroxide (MILK OF MAGNESIA) suspension 30 mL  30 mL Oral Daily PRN [name], MD   30 mL at 03/23/20 1344  . metFORMIN (GLUCOPHAGE) tablet 850 mg  850 mg Oral BID WC [name], [name], MD   850 mg at 05/07/20 8588  . metoprolol succinate (TOPROL-XL) 24 hr tablet 50 mg  50 mg Oral Daily [name], [name], MD   50 mg at 05/07/20 5027  . ondansetron (ZOFRAN) tablet 4 mg  4 mg Oral Q8H PRN [name], [name], MD   4 mg at 04/30/20 2035  . oxybutynin (DITROPAN) tablet 10 mg  10 mg Oral BID [name], [name], MD   10 mg at 05/07/20 7412  . pantoprazole (PROTONIX) EC tablet 40 mg  40 mg Oral Daily [name], [name], MD   40 mg at 05/07/20 8786  . prazosin (MINIPRESS) capsule 1 mg  1 mg Oral QHS [name], [name], MD   1 mg at 05/04/20 2124  . QUEtiapine (SEROQUEL) tablet 100 mg  100 mg Oral QID [name], [name], MD   100 mg at 05/07/20 7672  . QUEtiapine (SEROQUEL) tablet 400 mg  400 mg Oral QHS [name], NP   400 mg at 05/06/20 2113  . sertraline (ZOLOFT) tablet 50 mg  50 mg Oral BID [name], [name], MD   50 mg at 05/07/20 0947  . simethicone (MYLICON) chewable tablet 80 mg  80 mg Oral TID [name], [name], MD   80 mg at 05/07/20 0962  . simvastatin (ZOCOR) tablet 20 mg  20 mg Oral q1800 [name], [name], MD   20 mg at 05/06/20 1708  . tamsulosin (FLOMAX) capsule 0.8 mg  0.8 mg Oral QPC supper [name], [name], MD   0.8 mg at 05/06/20 1709    Lab Results:  No results found for this or any previous visit (from the past 48 hour(s)).  Blood Alcohol level:  Lab  Results  Component Value Date   ETH <10 01/22/2020   ETH <10 83/66/2947    Metabolic Disorder Labs: Lab Results  Component Value Date   HGBA1C 6.6 (H) 02/20/2020   MPG 142.72 02/20/2020   No results found for: PROLACTIN Lab Results  Component Value Date   CHOL 153 04/10/2015   TRIG 166 (H) 04/10/2015   HDL 50 04/10/2015   CHOLHDL 3.1 04/10/2015   VLDL 33 04/10/2015   LDLCALC 70 04/10/2015    Physical Findings: AIMS: Facial and Oral Movements Muscles of Facial Expression: None, normal Lips and Perioral Area: None, normal Jaw: None, normal Tongue: None, normal,Extremity Movements Upper (arms, wrists, hands, fingers): None, normal Lower (legs, knees, ankles, toes): None, normal, Trunk Movements Neck, shoulders, hips: None, normal, Overall Severity Severity of abnormal movements (highest score from questions above): None, normal Incapacitation due to abnormal movements: None, normal Patient's awareness of abnormal movements (rate only patient's report): No Awareness, Dental Status Current problems with teeth and/or dentures?: No Does patient usually wear dentures?: No  CIWA:    COWS:     Musculoskeletal: Strength & Muscle Tone: within normal limits Gait & Station: normal Patient leans: N/A  Psychiatric Specialty Exam: Physical Exam Vitals and nursing note reviewed.  Constitutional:      Appearance: Normal appearance.  HENT:     Head: Normocephalic and atraumatic.     Right Ear: External ear  normal.     Left Ear: External ear normal.     Nose: Nose normal.     Mouth/Throat:     Mouth: Mucous membranes are moist.     Pharynx: Oropharynx is clear.  Eyes:     Extraocular Movements: Extraocular movements intact.     Conjunctiva/sclera: Conjunctivae normal.     Pupils: Pupils are equal, round, and reactive to light.  Cardiovascular:     Rate and Rhythm: Normal rate.     Pulses: Normal pulses.  Pulmonary:     Effort: Pulmonary effort is normal.     Breath  sounds: Normal breath sounds.  Abdominal:     General: Abdomen is flat.     Palpations: Abdomen is soft.  Musculoskeletal:        General: No swelling. Normal range of motion.     Cervical back: Normal range of motion and neck supple.  Skin:    General: Skin is warm and dry.  Neurological:     General: No focal deficit present.     Mental Status: He is alert. Mental status is at baseline.  Psychiatric:        Mood and Affect: Mood normal.        Behavior: Behavior normal.        Thought Content: Thought content normal.        Judgment: Judgment normal.     Review of Systems  Constitutional: Negative for appetite change and fatigue.  HENT: Negative for rhinorrhea and sore throat.   Eyes: Negative for photophobia and visual disturbance.  Respiratory: Negative for cough and shortness of breath.   Cardiovascular: Negative for chest pain and palpitations.  Gastrointestinal: Negative for constipation, diarrhea, nausea and vomiting.  Endocrine: Negative for cold intolerance and heat intolerance.  Genitourinary: Negative for difficulty urinating and dysuria.  Musculoskeletal: Negative for back pain and neck stiffness.  Skin: Negative for rash and wound.  Allergic/Immunologic: Negative for food allergies and immunocompromised state.  Neurological: Negative for dizziness and headaches.  Hematological: Negative for adenopathy. Does not bruise/bleed easily.  Psychiatric/Behavioral: Negative for hallucinations and suicidal ideas.    Blood pressure 131/81, pulse 81, temperature 98.6 F (37 C), temperature source Oral, resp. rate 18, height 5\' 8"  (1.727 m), weight 104.3 kg, SpO2 96 %.Body mass index is 34.96 kg/m.  General Appearance: Casual  Eye Contact:  Good  Speech:  Clear and Coherent  Volume:  Normal  Mood:  Hopeless  Affect:  Congruent  Thought Process:  Coherent  Orientation:  Full (Time, Place, and Person)  Thought Content:  Logical  Suicidal Thoughts:  No  Homicidal  Thoughts:  No  Memory:  Immediate;   Fair Recent;   Fair Remote;   Fair  Judgement:  Fair  Insight:  Fair  Psychomotor Activity:  Normal  Concentration:  Concentration: Fair and Attention Span: Fair  Recall:  [employer] of Knowledge:  Fair  Language:  Fair  Akathisia:  Negative  Handed:  Right  AIMS (if indicated):     Assets:  Communication Skills Desire for Improvement Resilience Social Support  ADL's:  Intact  Cognition:  WNL  Sleep:  Number of Hours: 6     Treatment Plan Summary: Daily contact with patient to assess and evaluate symptoms and progress in treatment and Medication management. Plan: no changes to current medications. Patient is at baseline, but requires placement.   Salley Scarlet, MD 05/07/2020, 11:30 AM

## 2020-05-07 NOTE — BHH Counselor (Signed)
CSW spoke with the patient's brother.  CSW encouraged patient to follow up with his brother on possible placements at the request of potential placement.  Request was for the brother to discuss group home/assisted living rules.  CSW attempted to contact [name] helping with placement, [phone].  There was to be a phone interview with [facility] however, CSW was never given any contact information.  CSW left HIPAA compliant voicemail.  CSW faxed requested information to [facility] with [facility].  CSW received notice that fax was successful.   Assunta Curtis, MSW, LCSW 05/07/2020 2:44 PM

## 2020-05-08 DIAGNOSIS — F0391 Unspecified dementia with behavioral disturbance: Secondary | ICD-10-CM | POA: Diagnosis not present

## 2020-05-08 NOTE — BHH Counselor (Signed)
CSW again attempted to contact Alexander Beard, [phone].  She provided contact information for [facility]. She reports concerns that the patient's insurances wouldn't allow the pt to go to the home due to pt making "too much money".    She reports that she didn't feel comfortable talking about medications and encouraged CSW to follow up with [facility].   Assunta Curtis, MSW, LCSW 05/08/2020 4:39 PM

## 2020-05-08 NOTE — BHH Counselor (Signed)
CSW again attempted to contact [name], [phone].  CSW left HIPAA compliant voicemail.  Assunta Curtis, MSW, LCSW 05/08/2020 11:11 AM

## 2020-05-08 NOTE — [facility] Group Notes (Signed)
LCSW Group Therapy Note  05/08/2020 3:36 PM  Type of Therapy and Topic:  Group Therapy:  Feelings around Relapse and Recovery  Participation Level:  Minimal   Description of Group:    Patients in this group will discuss emotions they experience before and after a relapse. They will process how experiencing these feelings, or avoidance of experiencing them, relates to having a relapse. Facilitator will guide patients to explore emotions they have related to recovery. Patients will be encouraged to process which emotions are more powerful. They will be guided to discuss the emotional reaction significant others in their lives may have to their relapse or recovery. Patients will be assisted in exploring ways to respond to the emotions of others without this contributing to a relapse.  Therapeutic Goals: 1. Patient will identify two or more emotions that lead to a relapse for them 2. Patient will identify two emotions that result when they relapse 3. Patient will identify two emotions related to recovery 4. Patient will demonstrate ability to communicate their needs through discussion and/or role plays   Summary of Patient Progress: Pt was quiet throughout the group. However, towards the end he did comment. His comments were germane to the topic at hand.   Therapeutic Modalities:   Cognitive Behavioral Therapy Solution-Focused Therapy Assertiveness Training Relapse Prevention Therapy  Hedy Camara R. [name], MSW, [place], [place] 05/08/2020 3:36 PM

## 2020-05-08 NOTE — Progress Notes (Signed)
[facility] MD Progress Note  05/08/2020 10:25 AM Alexander Beard  MRN:  [id number]   Subjective:  Ms. Bruna seen one-on-one in office.  He had no behavioral issues overnight or today.  He denies suicidal ideations, homicidal ideations, visual hallucinations, or auditory hallucinations.  He continues to focus on volunteering in the community. He also hopes to play the game corn hole in the courtyard with the nursing students today.   Principal Problem: Dementia ([place]) Diagnosis: Principal Problem:   Dementia ([place]) Active Problems:   Schizoaffective disorder, bipolar type ([place])   Active autistic disorder   Diabetes ([place])   Hypertension   Prostate hypertrophy   Intellectual disability   At risk for elopement  Total Time spent with patient: 30 minutes  Past Psychiatric History: Past history of longstanding impairment see many previous notes  Past Medical History:  Past Medical History:  Diagnosis Date  . Anxiety   . Anxiety disorder due to known physiological condition    HOSPITALIZED 10/18  . Arthritis   . Autistic disorder, residual state   . COPD (chronic obstructive pulmonary disease) ([place])   . Depression   . Developmental disorder   . Diabetes mellitus without complication ([place])   . Dyslipidemia   . Esophageal reflux   . HOH (hard of hearing)    MILDLY  . Hypertension   . Obesity   . Overactive bladder   . Palpitations    ANXIETY  . Schizophrenia, schizoaffective ([place])   . Sleep apnea   . Tremors of nervous system    HANDS DUE TO MEDICATIONS  . Urinary incontinence     Past Surgical History:  Procedure Laterality Date  . CATARACT EXTRACTION W/PHACO Left 11/14/2017   Procedure: CATARACT EXTRACTION PHACO AND INTRAOCULAR LENS PLACEMENT (IOC);  Surgeon: Birder Robson, MD;  Location: [facility] ORS;  Service: Ophthalmology;  Laterality: Left;  [place] 00:42 AP% 14.6 CDE 6.12 Fluid pack lot # [id number]  . COLONOSCOPY  01/28/2005  . COLONOSCOPY WITH PROPOFOL N/A 05/09/2016    Procedure: COLONOSCOPY WITH PROPOFOL;  Surgeon: Manya Silvas, MD;  Location: [facility] ENDOSCOPY;  Service: Endoscopy;  Laterality: N/A;  . ESOPHAGOGASTRODUODENOSCOPY N/A 05/09/2016   Procedure: ESOPHAGOGASTRODUODENOSCOPY (EGD);  Surgeon: Manya Silvas, MD;  Location: [facility] ENDOSCOPY;  Service: Endoscopy;  Laterality: N/A;  . FRACTURE SURGERY     ORIF SHOULDER  . TONSILLECTOMY     Family History:  Family History  Problem Relation Age of Onset  . Hypertension Mother   . Stroke Father   . Heart Problems Father    Family Psychiatric  History: see previous Social History:  Social History   Substance and Sexual Activity  Alcohol Use No  . Alcohol/week: 0.0 standard drinks     Social History   Substance and Sexual Activity  Drug Use No    Social History   Socioeconomic History  . Marital status: Single    Spouse name: Not on file  . Number of children: Not on file  . Years of education: Not on file  . Highest education level: Not on file  Occupational History  . Not on file  Tobacco Use  . Smoking status: Never Smoker  . Smokeless tobacco: Never Used  Vaping Use  . Vaping Use: Never used  Substance and Sexual Activity  . Alcohol use: No    Alcohol/week: 0.0 standard drinks  . Drug use: No  . Sexual activity: Never  Other Topics Concern  . Not on file  Social History Narrative  The patient never finished high school but did get his GED. 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 He is currently in disability and [name] is his legal guardian. He has been living in group homes for many years.      No pending legal charges   Social Determinants of Health   Financial Resource Strain:   . Difficulty of Paying Living Expenses: Not on file  Food Insecurity:   . Worried About Charity fundraiser in the Last Year: Not on file  . Ran Out of Food in the Last Year: Not on file  Transportation Needs:   .  Lack of Transportation (Medical): Not on file  . Lack of Transportation (Non-Medical): Not on file  Physical Activity:   . Days of Exercise per Week: Not on file  . Minutes of Exercise per Session: Not on file  Stress:   . Feeling of Stress : Not on file  Social Connections:   . Frequency of Communication with Friends and Family: Not on file  . Frequency of Social Gatherings with Friends and Family: Not on file  . Attends Religious Services: Not on file  . Active Member of Clubs or Organizations: Not on file  . Attends Archivist Meetings: Not on file  . Marital Status: Not on file   Additional Social History:                         Sleep: Good  Appetite:  Good  Current Medications: Current Facility-Administered Medications  Medication Dose Route Frequency Provider Last Rate Last Admin  . acetaminophen (TYLENOL) tablet 650 mg  650 mg Oral Q6H PRN [name], MD      . alum & mag hydroxide-simeth (MAALOX/MYLANTA) 200-200-20 MG/5ML suspension 30 mL  30 mL Oral Q4H PRN [name], MD      . aspirin EC tablet 81 mg  81 mg Oral Daily [name], [name], MD   81 mg at 05/08/20 0805  . clonazePAM (KLONOPIN) tablet 1 mg  1 mg Oral QID [name], [name], MD   1 mg at 05/08/20 0805  . divalproex (DEPAKOTE ER) 24 hr tablet 1,500 mg  1,500 mg Oral QHS [name], [name], MD   1,500 mg at 05/07/20 2147  . gabapentin (NEURONTIN) capsule 100 mg  100 mg Oral TID [name], [name], MD   100 mg at 05/08/20 0805  . hydrOXYzine (ATARAX/VISTARIL) tablet 10 mg  10 mg Oral TID PRN [name], MD   10 mg at 04/20/20 2128  . influenza vac split quadrivalent PF (FLUARIX) injection 0.5 mL  0.5 mL Intramuscular Tomorrow-1000 [name], [name], MD      . lamoTRIgine (LAMICTAL) tablet 100 mg  100 mg Oral QHS [name], [name], MD   100 mg at 05/07/20 2147  . linagliptin (TRADJENTA) tablet 5 mg  5 mg Oral Daily [name], [name], MD   5 mg at 05/08/20 0805  . loperamide (IMODIUM) capsule 2  mg  2 mg Oral Q4H PRN [name], MD   2 mg at 04/10/20 0917  . loratadine (CLARITIN) tablet 10 mg  10 mg Oral Daily [name], [name], MD   10 mg at 05/08/20 0805  . LORazepam (ATIVAN) tablet 2 mg  2 mg Oral Q6H PRN [name], [name], MD   2 mg at 05/02/20 1211  . magnesium hydroxide (MILK  OF MAGNESIA) suspension 30 mL  30 mL Oral Daily PRN [name], MD   30 mL at 03/23/20 1344  . metFORMIN (GLUCOPHAGE) tablet 850 mg  850 mg Oral BID WC [name], [name], MD   850 mg at 05/08/20 0804  . metoprolol succinate (TOPROL-XL) 24 hr tablet 50 mg  50 mg Oral Daily [name], [name], MD   50 mg at 05/08/20 0804  . ondansetron (ZOFRAN) tablet 4 mg  4 mg Oral Q8H PRN [name], [name], MD   4 mg at 04/30/20 2035  . oxybutynin (DITROPAN) tablet 10 mg  10 mg Oral BID [name], [name], MD   10 mg at 05/08/20 0804  . pantoprazole (PROTONIX) EC tablet 40 mg  40 mg Oral Daily [name], [name], MD   40 mg at 05/08/20 0805  . prazosin (MINIPRESS) capsule 1 mg  1 mg Oral QHS [name], [name], MD   1 mg at 05/04/20 2124  . QUEtiapine (SEROQUEL) tablet 100 mg  100 mg Oral QID [name], [name], MD   100 mg at 05/08/20 0804  . QUEtiapine (SEROQUEL) tablet 400 mg  400 mg Oral QHS [name], NP   400 mg at 05/07/20 2147  . sertraline (ZOLOFT) tablet 50 mg  50 mg Oral BID [name], [name], MD   50 mg at 05/08/20 0804  . simethicone (MYLICON) chewable tablet 80 mg  80 mg Oral TID [name], [name], MD   80 mg at 05/08/20 0804  . simvastatin (ZOCOR) tablet 20 mg  20 mg Oral q1800 [name], [name], MD   20 mg at 05/07/20 1700  . tamsulosin (FLOMAX) capsule 0.8 mg  0.8 mg Oral QPC supper [name], [name], MD   0.8 mg at 05/07/20 1655    Lab Results:  No results found for this or any previous visit (from the past 48 hour(s)).  Blood Alcohol level:  Lab Results  Component Value Date   ETH <10 01/22/2020   ETH <10 16/04/9603    Metabolic Disorder Labs: Lab Results  Component Value Date   HGBA1C 6.6 (H) 02/20/2020    MPG 142.72 02/20/2020   No results found for: PROLACTIN Lab Results  Component Value Date   CHOL 153 04/10/2015   TRIG 166 (H) 04/10/2015   HDL 50 04/10/2015   CHOLHDL 3.1 04/10/2015   VLDL 33 04/10/2015   LDLCALC 70 04/10/2015    Physical Findings: AIMS: Facial and Oral Movements Muscles of Facial Expression: None, normal Lips and Perioral Area: None, normal Jaw: None, normal Tongue: None, normal,Extremity Movements Upper (arms, wrists, hands, fingers): None, normal Lower (legs, knees, ankles, toes): None, normal, Trunk Movements Neck, shoulders, hips: None, normal, Overall Severity Severity of abnormal movements (highest score from questions above): None, normal Incapacitation due to abnormal movements: None, normal Patient's awareness of abnormal movements (rate only patient's report): No Awareness, Dental Status Current problems with teeth and/or dentures?: No Does patient usually wear dentures?: No  CIWA:    COWS:     Musculoskeletal: Strength & Muscle Tone: within normal limits Gait & Station: normal Patient leans: N/A  Psychiatric Specialty Exam: Physical Exam Vitals and nursing note reviewed.  Constitutional:      Appearance: Normal appearance.  HENT:     Head: Normocephalic and atraumatic.     Right Ear: External ear normal.     Left Ear: External ear normal.     Nose: Nose normal.     Mouth/Throat:     Mouth:  Mucous membranes are moist.     Pharynx: Oropharynx is clear.  Eyes:     Extraocular Movements: Extraocular movements intact.     Conjunctiva/sclera: Conjunctivae normal.     Pupils: Pupils are equal, round, and reactive to light.  Cardiovascular:     Rate and Rhythm: Normal rate.     Pulses: Normal pulses.  Pulmonary:     Effort: Pulmonary effort is normal.     Breath sounds: Normal breath sounds.  Abdominal:     General: Abdomen is flat.     Palpations: Abdomen is soft.  Musculoskeletal:        General: No swelling. Normal range of  motion.     Cervical back: Normal range of motion and neck supple.  Skin:    General: Skin is warm and dry.  Neurological:     General: No focal deficit present.     Mental Status: He is alert. Mental status is at baseline.  Psychiatric:        Mood and Affect: Mood normal.        Behavior: Behavior normal.        Thought Content: Thought content normal.        Judgment: Judgment normal.     Review of Systems  Constitutional: Negative for appetite change and fatigue.  HENT: Negative for rhinorrhea and sore throat.   Eyes: Negative for photophobia and visual disturbance.  Respiratory: Negative for cough and shortness of breath.   Cardiovascular: Negative for chest pain and palpitations.  Gastrointestinal: Negative for constipation, diarrhea, nausea and vomiting.  Endocrine: Negative for cold intolerance and heat intolerance.  Genitourinary: Negative for difficulty urinating and dysuria.  Musculoskeletal: Negative for back pain and neck stiffness.  Skin: Negative for rash and wound.  Allergic/Immunologic: Negative for food allergies and immunocompromised state.  Neurological: Negative for dizziness and headaches.  Hematological: Negative for adenopathy. Does not bruise/bleed easily.  Psychiatric/Behavioral: Negative for hallucinations and suicidal ideas.    Blood pressure 121/78, pulse 75, temperature 98.3 F (36.8 C), temperature source Oral, resp. rate 18, height 5\' 8"  (1.727 m), weight 104.3 kg, SpO2 94 %.Body mass index is 34.96 kg/m.  General Appearance: Casual  Eye Contact:  Good  Speech:  Clear and Coherent  Volume:  Normal  Mood:  Hopeless  Affect:  Congruent  Thought Process:  Coherent  Orientation:  Full (Time, Place, and Person)  Thought Content:  Logical  Suicidal Thoughts:  No  Homicidal Thoughts:  No  Memory:  Immediate;   Fair Recent;   Fair Remote;   Fair  Judgement:  Fair  Insight:  Fair  Psychomotor Activity:  Normal  Concentration:  Concentration:  Fair and Attention Span: Fair  Recall:  [employer] of Knowledge:  Fair  Language:  Fair  Akathisia:  Negative  Handed:  Right  AIMS (if indicated):     Assets:  Communication Skills Desire for Improvement Resilience Social Support  ADL's:  Intact  Cognition:  WNL  Sleep:  Number of Hours: 6     Treatment Plan Summary: Daily contact with patient to assess and evaluate symptoms and progress in treatment and Medication management. Plan: no changes to current medications. Patient is at baseline, but requires placement.   Salley Scarlet, MD 05/08/2020, 10:25 AM

## 2020-05-08 NOTE — BHH Counselor (Signed)
CSW called DeeDee at [facility] [phone].  CSW left HIPAA compliant voicemail.  Assunta Curtis, MSW, LCSW 05/08/2020 11:59 AM

## 2020-05-08 NOTE — Progress Notes (Signed)
Recreation Therapy Notes   Date: 05/08/2020  Time: 9:30 am   Location: Craft room   Behavioral response: Appropriate  Intervention Topic: Self-Care   Discussion/Intervention:  Group content today was focused on Self-Care. The group defined self-care and some positive ways they care for themselves. Individuals expressed ways and reasons why they neglected any self-care in the past. Patients described ways to improve self-care in the future. The group explained what could happen if they did not do any self-care activities at all. The group participated in the intervention "self-care assessment" where they had a chance to discover some of their weaknesses and strengths in self- care. Patient came up with a self-care plan to improve themselves in the future.   Clinical Observations/Feedback: Patient came to group late and was focused on what peers and staff had to say about self-care. He stated that carpet ball could help him improve his self-care.Individual was social with peers and staff while participating in the intervention.  Dara Beidleman LRT/CTRS           Kristl Morioka 05/08/2020 12:22 PM

## 2020-05-08 NOTE — Plan of Care (Signed)
D- Patient alert and oriented. Patient presented in a pleasant mood on assessment stating that he slept good last night and had no complaints to voice to this Probation officer. Patient denied SI, HI, AVH, and pain at this time. Patient also denied any signs/symptoms of depression/anxiety, reporting that he is going to "be thoughtful of others and myself". Patient's goal for today is to "don't be upset and stay calm and let conditions around you bother you", in which he will "stay busy, have self-confidence and optimism", in order to achieve his goal.   A- Scheduled medications administered to patient, per MD orders. Support and encouragement provided.  Routine safety checks conducted every 15 minutes.  Patient informed to notify staff with problems or concerns.  R- No adverse drug reactions noted. Patient contracts for safety at this time. Patient compliant with medications and treatment plan. Patient receptive, calm, and cooperative. Patient interacts well with others on the unit.  Patient remains safe at this time.  Problem: Education: Goal: Knowledge of [place] [facility] Education information/materials will improve Outcome: Progressing Goal: Emotional status will improve Outcome: Progressing Goal: Mental status will improve Outcome: Progressing Goal: Verbalization of understanding the information provided will improve Outcome: Progressing   Problem: Activity: Goal: Interest or engagement in activities will improve Outcome: Progressing Goal: Sleeping patterns will improve Outcome: Progressing   Problem: Coping: Goal: Ability to verbalize frustrations and anger appropriately will improve Outcome: Progressing Goal: Ability to demonstrate self-control will improve Outcome: Progressing   Problem: Health Behavior/Discharge Planning: Goal: Identification of resources available to assist in meeting health care needs will improve Outcome: Progressing Goal: Compliance with treatment plan for  underlying cause of condition will improve Outcome: Progressing   Problem: Physical Regulation: Goal: Ability to maintain clinical measurements within normal limits will improve Outcome: Progressing   Problem: Safety: Goal: Periods of time without injury will increase Outcome: Progressing   Problem: Education: Goal: Ability to make informed decisions regarding treatment will improve Outcome: Progressing   Problem: Coping: Goal: Coping ability will improve Outcome: Progressing   Problem: Health Behavior/Discharge Planning: Goal: Identification of resources available to assist in meeting health care needs will improve Outcome: Progressing   Problem: Medication: Goal: Compliance with prescribed medication regimen will improve Outcome: Progressing   Problem: Self-Concept: Goal: Ability to disclose and discuss suicidal ideas will improve Outcome: Progressing Goal: Will verbalize positive feelings about self Outcome: Progressing   Problem: Education: Goal: Knowledge of [place] [facility] Education information/materials will improve Outcome: Progressing Goal: Emotional status will improve Outcome: Progressing Goal: Mental status will improve Outcome: Progressing Goal: Verbalization of understanding the information provided will improve Outcome: Progressing   Problem: Activity: Goal: Interest or engagement in activities will improve Outcome: Progressing Goal: Sleeping patterns will improve Outcome: Progressing   Problem: Coping: Goal: Ability to verbalize frustrations and anger appropriately will improve Outcome: Progressing Goal: Ability to demonstrate self-control will improve Outcome: Progressing   Problem: Health Behavior/Discharge Planning: Goal: Identification of resources available to assist in meeting health care needs will improve Outcome: Progressing Goal: Compliance with treatment plan for underlying cause of condition will improve Outcome: Progressing    Problem: Physical Regulation: Goal: Ability to maintain clinical measurements within normal limits will improve Outcome: Progressing   Problem: Safety: Goal: Periods of time without injury will increase Outcome: Progressing   Problem: Education: Goal: Knowledge of [place] [facility] Education information/materials will improve Outcome: Progressing Goal: Emotional status will improve Outcome: Progressing Goal: Mental status will improve Outcome: Progressing Goal: Verbalization of understanding  the information provided will improve Outcome: Progressing   Problem: Activity: Goal: Interest or engagement in activities will improve Outcome: Progressing Goal: Sleeping patterns will improve Outcome: Progressing   Problem: Coping: Goal: Ability to verbalize frustrations and anger appropriately will improve Outcome: Progressing Goal: Ability to demonstrate self-control will improve Outcome: Progressing   Problem: Health Behavior/Discharge Planning: Goal: Identification of resources available to assist in meeting health care needs will improve Outcome: Progressing Goal: Compliance with treatment plan for underlying cause of condition will improve Outcome: Progressing   Problem: Physical Regulation: Goal: Ability to maintain clinical measurements within normal limits will improve Outcome: Progressing   Problem: Safety: Goal: Periods of time without injury will increase Outcome: Progressing   Problem: Education: Goal: Knowledge of [place] [facility] Education information/materials will improve Outcome: Progressing Goal: Emotional status will improve Outcome: Progressing Goal: Mental status will improve Outcome: Progressing Goal: Verbalization of understanding the information provided will improve Outcome: Progressing   Problem: Activity: Goal: Interest or engagement in activities will improve Outcome: Progressing Goal: Sleeping patterns will improve Outcome:  Progressing   Problem: Coping: Goal: Ability to verbalize frustrations and anger appropriately will improve Outcome: Progressing Goal: Ability to demonstrate self-control will improve Outcome: Progressing   Problem: Health Behavior/Discharge Planning: Goal: Identification of resources available to assist in meeting health care needs will improve Outcome: Progressing Goal: Compliance with treatment plan for underlying cause of condition will improve Outcome: Progressing   Problem: Physical Regulation: Goal: Ability to maintain clinical measurements within normal limits will improve Outcome: Progressing   Problem: Safety: Goal: Periods of time without injury will increase Outcome: Progressing

## 2020-05-08 NOTE — BHH Counselor (Signed)
CSW faxed financial information to [employer].  Fax was successful.  Assunta Curtis, MSW, LCSW 05/08/2020 4:42 PM

## 2020-05-08 NOTE — BHH Counselor (Signed)
CSW called Scot Jun at [phone] with [facility].  CSW unable to leave message as voicemail box is full.   Assunta Curtis, MSW, LCSW 05/08/2020 11:14 AM

## 2020-05-08 NOTE — BHH Counselor (Signed)
CSW called DeeDee at [facility] [phone].  CSW left HIPAA compliant voicemail.  Assunta Curtis, MSW, LCSW 05/08/2020 3:44 PM

## 2020-05-09 DIAGNOSIS — F0391 Unspecified dementia with behavioral disturbance: Secondary | ICD-10-CM | POA: Diagnosis not present

## 2020-05-09 NOTE — BHH Group Notes (Signed)
°  B[facility] LCSW Group Therapy Note  Date/Time:  05/09/2020 1:00 PM-2:10 PM  Type of Therapy and Topic:  Group Therapy:  Feelings About Hospitalization  Participation Level:  Active   Description of Group This process group involved patients discussing their feelings related to being hospitalized, as well as the benefits they see to being in the hospital.  These feelings and benefits were itemized.  The group then brainstormed specific ways in which they could seek those same benefits when they discharge and return home.  Therapeutic Goals 1. Patient will identify and describe positive and negative feelings related to hospitalization 2. Patient will verbalize benefits of hospitalization to themselves personally 3. Patients will brainstorm together ways they can obtain similar benefits in the outpatient setting, identify barriers to wellness and possible solutions  Summary of Patient Progress:  Patient checked into group feeling alright. Patient spoke about being independent which has been a positive for him while he has been in the hospital. Patient stated he has developed more patience. Patient also stated that he likes the rules but feels some are over the top. Patient stated the negatives include not having the freedom to use certain art supplies. Patient has thought about outpatient services such a charity work and getting back to his old life when discharged.   Therapeutic Modalities Cognitive Behavioral Therapy Motivational Interviewing    [name], [place] 05/09/2020  4:02 PM

## 2020-05-09 NOTE — Plan of Care (Signed)
Problem: Education: Goal: Knowledge of [facility] Education information/materials will improve Outcome: Progressing Goal: Emotional status will improve Outcome: Progressing Goal: Mental status will improve Outcome: Progressing Goal: Verbalization of understanding the information provided will improve Outcome: Progressing   Problem: Activity: Goal: Interest or engagement in activities will improve Outcome: Progressing Goal: Sleeping patterns will improve Outcome: Progressing   Problem: Coping: Goal: Ability to verbalize frustrations and anger appropriately will improve Outcome: Progressing Goal: Ability to demonstrate self-control will improve Outcome: Progressing   Problem: Health Behavior/Discharge Planning: Goal: Identification of resources available to assist in meeting health care needs will improve Outcome: Progressing Goal: Compliance with treatment plan for underlying cause of condition will improve Outcome: Progressing   Problem: Physical Regulation: Goal: Ability to maintain clinical measurements within normal limits will improve Outcome: Progressing   Problem: Safety: Goal: Periods of time without injury will increase Outcome: Progressing   Problem: Education: Goal: Ability to make informed decisions regarding treatment will improve Outcome: Progressing   Problem: Coping: Goal: Coping ability will improve Outcome: Progressing   Problem: Health Behavior/Discharge Planning: Goal: Identification of resources available to assist in meeting health care needs will improve Outcome: Progressing   Problem: Medication: Goal: Compliance with prescribed medication regimen will improve Outcome: Progressing   Problem: Self-Concept: Goal: Ability to disclose and discuss suicidal ideas will improve Outcome: Progressing Goal: Will verbalize positive feelings about self Outcome: Progressing   Problem: Education: Goal: Knowledge of [facility]  Education information/materials will improve Outcome: Progressing Goal: Emotional status will improve Outcome: Progressing Goal: Mental status will improve Outcome: Progressing Goal: Verbalization of understanding the information provided will improve Outcome: Progressing   Problem: Activity: Goal: Interest or engagement in activities will improve Outcome: Progressing Goal: Sleeping patterns will improve Outcome: Progressing   Problem: Coping: Goal: Ability to verbalize frustrations and anger appropriately will improve Outcome: Progressing Goal: Ability to demonstrate self-control will improve Outcome: Progressing   Problem: Health Behavior/Discharge Planning: Goal: Identification of resources available to assist in meeting health care needs will improve Outcome: Progressing Goal: Compliance with treatment plan for underlying cause of condition will improve Outcome: Progressing   Problem: Physical Regulation: Goal: Ability to maintain clinical measurements within normal limits will improve Outcome: Progressing   Problem: Safety: Goal: Periods of time without injury will increase Outcome: Progressing   Problem: Education: Goal: Knowledge of [facility] Education information/materials will improve Outcome: Progressing Goal: Emotional status will improve Outcome: Progressing Goal: Mental status will improve Outcome: Progressing Goal: Verbalization of understanding the information provided will improve Outcome: Progressing   Problem: Activity: Goal: Interest or engagement in activities will improve Outcome: Progressing Goal: Sleeping patterns will improve Outcome: Progressing   Problem: Coping: Goal: Ability to verbalize frustrations and anger appropriately will improve Outcome: Progressing Goal: Ability to demonstrate self-control will improve Outcome: Progressing   Problem: Health Behavior/Discharge Planning: Goal: Identification of resources available  to assist in meeting health care needs will improve Outcome: Progressing Goal: Compliance with treatment plan for underlying cause of condition will improve Outcome: Progressing   Problem: Physical Regulation: Goal: Ability to maintain clinical measurements within normal limits will improve Outcome: Progressing   Problem: Safety: Goal: Periods of time without injury will increase Outcome: Progressing   Problem: Education: Goal: Knowledge of [facility] Education information/materials will improve Outcome: Progressing Goal: Emotional status will improve Outcome: Progressing Goal: Mental status will improve Outcome: Progressing Goal: Verbalization of understanding the information provided will improve Outcome: Progressing   Problem: Activity: Goal: Interest  or engagement in activities will improve Outcome: Progressing Goal: Sleeping patterns will improve Outcome: Progressing   Problem: Coping: Goal: Ability to verbalize frustrations and anger appropriately will improve Outcome: Progressing Goal: Ability to demonstrate self-control will improve Outcome: Progressing   Problem: Health Behavior/Discharge Planning: Goal: Identification of resources available to assist in meeting health care needs will improve Outcome: Progressing Goal: Compliance with treatment plan for underlying cause of condition will improve Outcome: Progressing   Problem: Physical Regulation: Goal: Ability to maintain clinical measurements within normal limits will improve Outcome: Progressing   Problem: Safety: Goal: Periods of time without injury will increase Outcome: Progressing

## 2020-05-09 NOTE — Tx Team (Signed)
Interdisciplinary Treatment and Diagnostic Plan Update  05/09/2020 Time of Session: 10:00 AM  Alexander Beard MRN: [id number]  Principal Diagnosis: Dementia [facility])  Secondary Diagnoses: Principal Problem:   Dementia ([place]) Active Problems:   Schizoaffective disorder, bipolar type ([place])   Active autistic disorder   Diabetes ([place])   Hypertension   Prostate hypertrophy   Intellectual disability   At risk for elopement   Current Medications:  Current Facility-Administered Medications  Medication Dose Route Frequency Provider Last Rate Last Admin  . acetaminophen (TYLENOL) tablet 650 mg  650 mg Oral Q6H PRN [name], MD      . alum & mag hydroxide-simeth (MAALOX/MYLANTA) 200-200-20 MG/5ML suspension 30 mL  30 mL Oral Q4H PRN [name], MD      . aspirin EC tablet 81 mg  81 mg Oral Daily [name], [name], MD   81 mg at 05/09/20 5993  . clonazePAM (KLONOPIN) tablet 1 mg  1 mg Oral QID [name], [name], MD   1 mg at 05/09/20 0829  . divalproex (DEPAKOTE ER) 24 hr tablet 1,500 mg  1,500 mg Oral QHS [name], [name], MD   1,500 mg at 05/08/20 2117  . gabapentin (NEURONTIN) capsule 100 mg  100 mg Oral TID [name], [name], MD   100 mg at 05/09/20 5701  . hydrOXYzine (ATARAX/VISTARIL) tablet 10 mg  10 mg Oral TID PRN [name], MD   10 mg at 04/20/20 2128  . influenza vac split quadrivalent PF (FLUARIX) injection 0.5 mL  0.5 mL Intramuscular Tomorrow-1000 [name], [name], MD      . lamoTRIgine (LAMICTAL) tablet 100 mg  100 mg Oral QHS [name], [name], MD   100 mg at 05/08/20 2117  . linagliptin (TRADJENTA) tablet 5 mg  5 mg Oral Daily [name], [name], MD   5 mg at 05/09/20 0829  . loperamide (IMODIUM) capsule 2 mg  2 mg Oral Q4H PRN [name], MD   2 mg at 04/10/20 0917  . loratadine (CLARITIN) tablet 10 mg  10 mg Oral Daily [name], [name], MD   10 mg at 05/09/20 7793  . LORazepam (ATIVAN) tablet 2 mg  2 mg Oral Q6H PRN [name], [name], MD   2 mg at 05/02/20 1211  .  magnesium hydroxide (MILK OF MAGNESIA) suspension 30 mL  30 mL Oral Daily PRN [name], MD   30 mL at 03/23/20 1344  . metFORMIN (GLUCOPHAGE) tablet 850 mg  850 mg Oral BID WC [name], [name], MD   850 mg at 05/09/20 0825  . metoprolol succinate (TOPROL-XL) 24 hr tablet 50 mg  50 mg Oral Daily [name], [name], MD   50 mg at 05/09/20 9030  . ondansetron (ZOFRAN) tablet 4 mg  4 mg Oral Q8H PRN [name], [name], MD   4 mg at 04/30/20 2035  . oxybutynin (DITROPAN) tablet 10 mg  10 mg Oral BID [name], [name], MD   10 mg at 05/09/20 0826  . pantoprazole (PROTONIX) EC tablet 40 mg  40 mg Oral Daily [name], [name], MD   40 mg at 05/09/20 0829  . prazosin (MINIPRESS) capsule 1 mg  1 mg Oral QHS [name], [name], MD   1 mg at 05/04/20 2124  . QUEtiapine (SEROQUEL) tablet 100 mg  100 mg Oral QID [name], [name], MD   100 mg at 05/09/20 0923  . QUEtiapine (SEROQUEL) tablet 400 mg  400 mg Oral QHS [name], NP  400 mg at 05/08/20 2116  . sertraline (ZOLOFT) tablet 50 mg  50 mg Oral BID [name], [name], MD   50 mg at 05/09/20 1497  . simethicone (MYLICON) chewable tablet 80 mg  80 mg Oral TID [name], [name], MD   80 mg at 05/09/20 0263  . simvastatin (ZOCOR) tablet 20 mg  20 mg Oral q1800 [name], [name], MD   20 mg at 05/08/20 1719  . tamsulosin (FLOMAX) capsule 0.8 mg  0.8 mg Oral QPC supper [name], [name], MD   0.8 mg at 05/08/20 1718   PTA Medications: Medications Prior to Admission  Medication Sig Dispense Refill Last Dose  . acetaminophen (TYLENOL) 650 MG CR tablet Take 650 mg by mouth every 12 (twelve) hours as needed for pain.     Marland Kitchen aspirin EC 81 MG tablet Take 81 mg by mouth daily.     . Calcium Carbonate-Vitamin D3 (CALCIUM 600-D) 600-400 MG-UNIT TABS Take 1 tablet by mouth 2 (two) times daily.     . Cholecalciferol (VITAMIN D-1000 MAX ST) 1000 UNITS tablet Take 1,000 Units by mouth daily.      Marland Kitchen docusate sodium (COLACE) 100 MG capsule Take 100 mg by mouth daily.      .  furosemide (LASIX) 20 MG tablet Take 20 mg by mouth daily.      Marland Kitchen gabapentin (NEURONTIN) 100 MG capsule Take 1 capsule (100 mg total) by mouth 3 (three) times daily. 90 capsule 10   . Insulin Detemir (LEVEMIR FLEXTOUCH) 100 UNIT/ML Pen Inject 15 Units into the skin daily at 10 pm.     . lamoTRIgine (LAMICTAL) 100 MG tablet Take 1 tablet (100 mg total) by mouth at bedtime. 30 tablet 10   . loratadine (CLARITIN) 10 MG tablet Take 1 tablet (10 mg total) by mouth daily. 30 tablet 5   . LORazepam (ATIVAN) 1 MG tablet Take 1 tablet (1 mg total) by mouth 3 (three) times daily. 90 tablet 5   . lurasidone (LATUDA) 40 MG TABS tablet Take 1 tablet (40 mg total) by mouth daily. with food 30 tablet 10   . metoprolol succinate (TOPROL-XL) 50 MG 24 hr tablet Take 50 mg by mouth daily.      . NON FORMULARY cpap device     . oxybutynin (DITROPAN) 5 MG tablet Take 5 mg by mouth daily.     . pantoprazole (PROTONIX) 40 MG tablet Take 40 mg by mouth daily.     . polyethylene glycol (MIRALAX / GLYCOLAX) packet Take 17 g by mouth daily.     . QUEtiapine (SEROQUEL) 100 MG tablet TAKE 1 TABLET  BY MOUTH THREE TIMES A DAY (Patient taking differently: Take 100 mg by mouth 3 (three) times daily. TAKE 1 TABLET  BY MOUTH THREE TIMES A DAY) 90 tablet 11   . QUEtiapine (SEROQUEL) 400 MG tablet Take 1 tablet (400 mg total) by mouth at bedtime. 30 tablet 10   . sertraline (ZOLOFT) 100 MG tablet Take 2 tablets (200 mg total) by mouth daily. (Patient taking differently: Take 200 mg by mouth daily. Take along with two 50 mg tablets (100 mg) for total 300 mg daily) 60 tablet 5   . sertraline (ZOLOFT) 50 MG tablet Take 2 tablets (100 mg total) by mouth daily. (Patient taking differently: Take 100 mg by mouth daily. Take along with two 100 mg tablets (200 mg) for total 300 mg daily) 60 tablet 5   . simethicone (MYLICON) 80 MG chewable tablet  Chew 80-160 mg by mouth 2 (two) times daily as needed for flatulence.     . simvastatin (ZOCOR) 20  MG tablet Take 20 mg by mouth at bedtime.      . sitaGLIPtin (JANUVIA) 100 MG tablet Take 100 mg by mouth daily.     . solifenacin (VESICARE) 5 MG tablet Take 5 mg by mouth daily.     . Starch (HEMORRHOIDAL RE) Place 1 application rectally 4 (four) times daily as needed (for itching).     . tamsulosin (FLOMAX) 0.4 MG CAPS capsule Take 0.4 mg by mouth daily.       Patient Stressors: Health problems Marital or family conflict Medication change or noncompliance Traumatic event  Patient Strengths: Motivation for treatment/growth Religious Affiliation  Treatment Modalities: Medication Management, Group therapy, Case management,  1 to 1 session with clinician, Psychoeducation, Recreational therapy.   Physician Treatment Plan for Primary Diagnosis: Dementia [facility]) Long Term Goal(s): Improvement in symptoms so as ready for discharge Improvement in symptoms so as ready for discharge   Short Term Goals: Ability to verbalize feelings will improve Ability to demonstrate self-control will improve Ability to maintain clinical measurements within normal limits will improve Compliance with prescribed medications will improve  Medication Management: Evaluate patient's response, side effects, and tolerance of medication regimen.  Therapeutic Interventions: 1 to 1 sessions, Unit Group sessions and Medication administration.  Evaluation of Outcomes: Progressing  Physician Treatment Plan for Secondary Diagnosis: Principal Problem:   Dementia ([place]) Active Problems:   Schizoaffective disorder, bipolar type ([place])   Active autistic disorder   Diabetes ([place])   Hypertension   Prostate hypertrophy   Intellectual disability   At risk for elopement  Long Term Goal(s): Improvement in symptoms so as ready for discharge Improvement in symptoms so as ready for discharge   Short Term Goals: Ability to verbalize feelings will improve Ability to demonstrate self-control will improve Ability to maintain  clinical measurements within normal limits will improve Compliance with prescribed medications will improve     Medication Management: Evaluate patient's response, side effects, and tolerance of medication regimen.  Therapeutic Interventions: 1 to 1 sessions, Unit Group sessions and Medication administration.  Evaluation of Outcomes: Progressing   RN Treatment Plan for Primary Diagnosis: Dementia ([place]) Long Term Goal(s): Knowledge of disease and therapeutic regimen to maintain health will improve  Short Term Goals: Ability to demonstrate self-control, Ability to participate in decision making will improve, Ability to verbalize feelings will improve, Ability to identify and develop effective coping behaviors will improve and Compliance with prescribed medications will improve  Medication Management: RN will administer medications as ordered by provider, will assess and evaluate patient's response and provide education to patient for prescribed medication. RN will report any adverse and/or side effects to prescribing provider.  Therapeutic Interventions: 1 on 1 counseling sessions, Psychoeducation, Medication administration, Evaluate responses to treatment, Monitor vital signs and CBGs as ordered, Perform/monitor CIWA, COWS, AIMS and Fall Risk screenings as ordered, Perform wound care treatments as ordered.  Evaluation of Outcomes: Progressing   LCSW Treatment Plan for Primary Diagnosis: Dementia ([place]) Long Term Goal(s): Safe transition to appropriate next level of care at discharge, Engage patient in therapeutic group addressing interpersonal concerns.  Short Term Goals: Engage patient in aftercare planning with referrals and resources, Increase social support, Increase ability to appropriately verbalize feelings, Increase emotional regulation and Increase skills for wellness and recovery  Therapeutic Interventions: Assess for all discharge needs, 1 to 1 time with Social worker, Explore  available  resources and support systems, Assess for adequacy in community support network, Educate family and significant other(s) on suicide prevention, Complete Psychosocial Assessment, Interpersonal group therapy.  Evaluation of Outcomes: Progressing   Progress in Treatment: Attending groups: Yes. Participating in groups: Yes. Taking medication as prescribed: Yes. Toleration medication: Yes. Family/Significant other contact made: Yes, individual(s) contacted:  [place], brother  Patient understands diagnosis: Yes. Discussing patient identified problems/goals with staff: Yes. Medical problems stabilized or resolved: Yes. Denies suicidal/homicidal ideation: Yes. Issues/concerns per patient self-inventory: No. Other: N/A  New problem(s) identified: No, Describe:  None  New Short Term/Long Term Goal(s): Elimination of symptoms of psychosis, medication management for mood stabilization; elimination of SI thoughts; development of comprehensive mental wellness/sobriety plan.Update 03/01/20:No changes at this time. 03/07/20: Update, no changes at this time Update 03/12/2020: No changes at this time.Update 03/17/2020:No changes at this time. Update 03/21/20:No changes at this time. Update 03/26/20: No changes at this time. Update 03/31/2020: No changes at this time.Update 04/04/20: No changes at this time Update 04/09/2020: No changes at this time. Update 04/14/2020: No changes at this time. Update 04/20/2020:No changes at this time. Update: 04/25/2020: No changes at this time. Update 04/30/2020:  No changes at this time. Update 05/05/2020: No changes at this time. Update 05/09/20: No changes at this time.   Patient Goals:  Patient stated he would like to go back to [place] and reconnect with his peer support. Patient also stated that he would like to increase his social support and help his brother.Update 03/07/20,no change Update 03/12/2020: No changes at this time. Update 03/17/2020:No  changes at this time.Update 03/21/20: No changes at this time. Update 03/26/20: No changes at this time. Update 03/31/2020: No changes at this time. Update9/25/21:No changes at this time. Update 04/09/2020: No changes at this time. Update 04/14/2020: No changes at this time. Update 04/20/2020:No changes at this time. 04/25/2020: Patient still indicates that he would like to go back to his old ways and life. Patient would like to get back into the community. 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 Update 04/30/2020:  No changes at this time. Update 05/05/2020: No changes at this time. Patient maintains that he wants to get back to his old way of life, volunteer in the community, do his artwork, and help his brother. Update 05/09/20: No changes at this time   Discharge Plan or Barriers: Patient has an interview with a group home on 03/09/20 at 2:00 PM. Update 03/12/2020: CSW continues to assist the patient in developing a housing plan.Update 03/17/2020:CSW has assisted patient in completing an interview with a group home, however, there has been no word on if the patient has been approved. Patient continues to struggle with his anxiety and nerves. Nurses report that he has increased in bed wetting. Psychiatrist and nurses believe that this may be related to his increased anxiety about finding a home.Update 03/21/20: Patient is still nervous and anxious about finding a new placement. Patient stated he could not stay here too much longer and wanted his life back. Update 03/26/20: CSW continues to look for placement for pt. Group Homes, [place] are being considered. Update 03/31/2020: Patient continues to have interview with group homes, however, no success at this time in identifying a home that has accepted him.Update: 04/04/20:No changes at this time. Update 04/09/2020: CSW continues to look for placement for patient. Update 04/14/2020: No changes at this  time. CSW continues to look for placement. Update 04/20/2020:CSW continues  to assist with placement needs. 04/25/2020: CSW continues to look for placement options. Update 04/30/2020:  CSW continues to look for placement for the patient.  Patient has been declined for 17 SNF's however, there are a quite a few where the referral is pending.  CSW has contacted several group homes, however, none have selected the patient. Update 05/05/2020: CSW continues to look for placement for the patient. Patient has been declined at 29 SNF's with quite a few referrals left pending. CSW also sent out referrals for memory care facilities. CSW continues to contact groups homes without any selecting the patient at the moment. Update 05/09/20: CSW will continue to find appropriate placement for patient.   Reason for Continuation of Hospitalization: Depression Medication stabilization  Estimated Length of Stay: TBD   Attendees: Patient: 05/09/2020 10:13 AM  Physician: Dr. Danella Sensing, MD  05/09/2020 10:13 AM  Nursing:  05/09/2020 10:13 AM  RN Care Manager: 05/09/2020 10:13 AM  Social Worker: [name], [place] 05/09/2020 10:13 AM  Recreational Therapist:  05/09/2020 10:13 AM  Other:  05/09/2020 10:13 AM  Other:  05/09/2020 10:13 AM  Other: 05/09/2020 10:13 AM    Scribe for Treatment Team: [name], [place] 05/09/2020 10:13 AM

## 2020-05-09 NOTE — Plan of Care (Signed)
Has been in the milieu with no major concerns. Alert and oriented and denying thoughts of self harm. Denying hallucinations. Patient ate breakfast and received AM medications. No major concerns so far. Safety monitored as recommended.

## 2020-05-09 NOTE — Progress Notes (Signed)
Patient calm and cooperative, denies SI, HI and AVH

## 2020-05-09 NOTE — Progress Notes (Signed)
[facility] MD Progress Note  05/09/2020 2:01 PM Alexander Beard  MRN:  [id number]  Principal Problem: Dementia [facility]) Diagnosis: Principal Problem:   Dementia ([place]) Active Problems:   Schizoaffective disorder, bipolar type ([place])   Active autistic disorder   Diabetes ([place])   Hypertension   Prostate hypertrophy   Intellectual disability   At risk for elopement  Alexander Beard is a 64 y.o. male pt that has a previous psychiatric history of Schizoaffective disorder who presents to the [facility] unit for treatment of psychosis.  Interval History Patient was seen today for re-evaluation.  Nursing reports no events overnight. The patient reports no issues with performing ADLs.  Patient has been medication compliant.  The patient reports no side effects from medications.    SUBJECTIVE:  Patient reports feeling well overall, denies feeling depressed, anxious; denies suicidal or homicidal thoughts, denies any hallucinations at the time of the interview. He denies side effects from his current medications; he relates his tiredness this morning to poor sleep last night. He reports he was happy to see nursing students yesterday, but is upset they could not  Go outside to play on fresh air.    Total Time spent with patient: 15 minutes  Past Psychiatric History: see H&P   Past Medical History:  Past Medical History:  Diagnosis Date  . Anxiety   . Anxiety disorder due to known physiological condition    HOSPITALIZED 10/18  . Arthritis   . Autistic disorder, residual state   . COPD (chronic obstructive pulmonary disease) ([place])   . Depression   . Developmental disorder   . Diabetes mellitus without complication ([place])   . Dyslipidemia   . Esophageal reflux   . HOH (hard of hearing)    MILDLY  . Hypertension   . Obesity   . Overactive bladder   . Palpitations    ANXIETY  . Schizophrenia, schizoaffective ([place])   . Sleep apnea   . Tremors of nervous system    HANDS DUE TO MEDICATIONS  . Urinary  incontinence     Past Surgical History:  Procedure Laterality Date  . CATARACT EXTRACTION W/PHACO Left 11/14/2017   Procedure: CATARACT EXTRACTION PHACO AND INTRAOCULAR LENS PLACEMENT (IOC);  Surgeon: Birder Robson, MD;  Location: [facility] ORS;  Service: Ophthalmology;  Laterality: Left;  [place] 00:42 AP% 14.6 CDE 6.12 Fluid pack lot # [id number]  . COLONOSCOPY  01/28/2005  . COLONOSCOPY WITH PROPOFOL N/A 05/09/2016   Procedure: COLONOSCOPY WITH PROPOFOL;  Surgeon: Manya Silvas, MD;  Location: [facility] ENDOSCOPY;  Service: Endoscopy;  Laterality: N/A;  . ESOPHAGOGASTRODUODENOSCOPY N/A 05/09/2016   Procedure: ESOPHAGOGASTRODUODENOSCOPY (EGD);  Surgeon: Manya Silvas, MD;  Location: [facility] ENDOSCOPY;  Service: Endoscopy;  Laterality: N/A;  . FRACTURE SURGERY     ORIF SHOULDER  . TONSILLECTOMY     Family History:  Family History  Problem Relation Age of Onset  . Hypertension Mother   . Stroke Father   . Heart Problems Father    Family Psychiatric  History: see H&P Social History   Substance and Sexual Activity  Alcohol Use No  . Alcohol/week: 0.0 standard drinks     Social History   Substance and Sexual Activity  Drug Use No    Social History   Socioeconomic History  . Marital status: Single    Spouse name: Not on file  . Number of children: Not on file  . Years of education: Not on file  . Highest education level: Not on file  Occupational History  .  Not on file  Tobacco Use  . Smoking status: Never Smoker  . Smokeless tobacco: Never Used  Vaping Use  . Vaping Use: Never used  Substance and Sexual Activity  . Alcohol use: No    Alcohol/week: 0.0 standard drinks  . Drug use: No  . Sexual activity: Never  Other Topics Concern  . Not on file  Social History Narrative   The patient never finished high school but did get his GED. 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 He is currently in disability and his  brother [name] is his legal guardian. He has been living in group homes for many years.      No pending legal charges   Social Determinants of Health   Financial Resource Strain:   . Difficulty of Paying Living Expenses: Not on file  Food Insecurity:   . Worried About Charity fundraiser in the Last Year: Not on file  . Ran Out of Food in the Last Year: Not on file  Transportation Needs:   . Lack of Transportation (Medical): Not on file  . Lack of Transportation (Non-Medical): Not on file  Physical Activity:   . Days of Exercise per Week: Not on file  . Minutes of Exercise per Session: Not on file  Stress:   . Feeling of Stress : Not on file  Social Connections:   . Frequency of Communication with Friends and Family: Not on file  . Frequency of Social Gatherings with Friends and Family: Not on file  . Attends Religious Services: Not on file  . Active Member of Clubs or Organizations: Not on file  . Attends Archivist Meetings: Not on file  . Marital Status: Not on file   Additional Social History:                         Sleep: Fair  Appetite:  Fair  Current Medications: Current Facility-Administered Medications  Medication Dose Route Frequency Provider Last Rate Last Admin  . acetaminophen (TYLENOL) tablet 650 mg  650 mg Oral Q6H PRN [name], MD      . alum & mag hydroxide-simeth (MAALOX/MYLANTA) 200-200-20 MG/5ML suspension 30 mL  30 mL Oral Q4H PRN [name], MD      . aspirin EC tablet 81 mg  81 mg Oral Daily [name], [name], MD   81 mg at 05/09/20 7673  . clonazePAM (KLONOPIN) tablet 1 mg  1 mg Oral QID [name], [name], MD   1 mg at 05/09/20 1233  . divalproex (DEPAKOTE ER) 24 hr tablet 1,500 mg  1,500 mg Oral QHS [name], [name], MD   1,500 mg at 05/08/20 2117  . gabapentin (NEURONTIN) capsule 100 mg  100 mg Oral TID [name], [name], MD   100 mg at 05/09/20 1233  . hydrOXYzine (ATARAX/VISTARIL) tablet 10 mg  10 mg Oral TID PRN [name], MD   10 mg at 04/20/20 2128  . influenza vac split quadrivalent PF (FLUARIX) injection 0.5 mL  0.5 mL Intramuscular Tomorrow-1000 [name], [name], MD      . lamoTRIgine (LAMICTAL) tablet 100 mg  100 mg Oral QHS [name], [name], MD   100 mg at 05/08/20 2117  . linagliptin (TRADJENTA) tablet 5 mg  5 mg Oral Daily [name], [name], MD   5 mg at 05/09/20 0829  . loperamide (IMODIUM) capsule 2  mg  2 mg Oral Q4H PRN [name], MD   2 mg at 04/10/20 9629  . loratadine (CLARITIN) tablet 10 mg  10 mg Oral Daily [name], [name], MD   10 mg at 05/09/20 5284  . LORazepam (ATIVAN) tablet 2 mg  2 mg Oral Q6H PRN [name], [name], MD   2 mg at 05/02/20 1211  . magnesium hydroxide (MILK OF MAGNESIA) suspension 30 mL  30 mL Oral Daily PRN [name], MD   30 mL at 03/23/20 1344  . metFORMIN (GLUCOPHAGE) tablet 850 mg  850 mg Oral BID WC [name], [name], MD   850 mg at 05/09/20 0825  . metoprolol succinate (TOPROL-XL) 24 hr tablet 50 mg  50 mg Oral Daily [name], [name], MD   50 mg at 05/09/20 1324  . ondansetron (ZOFRAN) tablet 4 mg  4 mg Oral Q8H PRN [name], [name], MD   4 mg at 04/30/20 2035  . oxybutynin (DITROPAN) tablet 10 mg  10 mg Oral BID [name], [name], MD   10 mg at 05/09/20 0826  . pantoprazole (PROTONIX) EC tablet 40 mg  40 mg Oral Daily [name], [name], MD   40 mg at 05/09/20 0829  . prazosin (MINIPRESS) capsule 1 mg  1 mg Oral QHS [name], [name], MD   1 mg at 05/04/20 2124  . QUEtiapine (SEROQUEL) tablet 100 mg  100 mg Oral QID [name], [name], MD   100 mg at 05/09/20 1233  . QUEtiapine (SEROQUEL) tablet 400 mg  400 mg Oral QHS [name], NP   400 mg at 05/08/20 2116  . sertraline (ZOLOFT) tablet 50 mg  50 mg Oral BID [name], [name], MD   50 mg at 05/09/20 4010  . simethicone (MYLICON) chewable tablet 80 mg  80 mg Oral TID [name], [name], MD   80 mg at 05/09/20 1233  . simvastatin (ZOCOR) tablet 20 mg  20 mg Oral q1800 [name], [name], MD   20 mg at 05/08/20 1719   . tamsulosin (FLOMAX) capsule 0.8 mg  0.8 mg Oral QPC supper [name], [name], MD   0.8 mg at 05/08/20 1718    Lab Results:  No results found for this or any previous visit (from the past 48 hour(s)).  Blood Alcohol level:  Lab Results  Component Value Date   ETH <10 01/22/2020   ETH <10 27/25/3664    Metabolic Disorder Labs: Lab Results  Component Value Date   HGBA1C 6.6 (H) 02/20/2020   MPG 142.72 02/20/2020   No results found for: PROLACTIN Lab Results  Component Value Date   CHOL 153 04/10/2015   TRIG 166 (H) 04/10/2015   HDL 50 04/10/2015   CHOLHDL 3.1 04/10/2015   VLDL 33 04/10/2015   LDLCALC 70 04/10/2015    Physical Findings: AIMS: Facial and Oral Movements Muscles of Facial Expression: None, normal Lips and Perioral Area: None, normal Jaw: None, normal Tongue: None, normal,Extremity Movements Upper (arms, wrists, hands, fingers): None, normal Lower (legs, knees, ankles, toes): None, normal, Trunk Movements Neck, shoulders, hips: None, normal, Overall Severity Severity of abnormal movements (highest score from questions above): None, normal Incapacitation due to abnormal movements: None, normal Patient's awareness of abnormal movements (rate only patient's report): No Awareness, Dental Status Current problems with teeth and/or dentures?: No Does patient usually wear dentures?: No  CIWA:    COWS:     Musculoskeletal: Strength & Muscle Tone: within normal limits Gait & Station: normal  Patient leans: N/A  Psychiatric Specialty Exam: Physical Exam   Review of Systems   Blood pressure 121/74, pulse 63, temperature (!) 97.5 F (36.4 C), temperature source Oral, resp. rate 18, height 5\' 8"  (1.727 m), weight 104.3 kg, SpO2 100 %.Body mass index is 34.96 kg/m.  General Appearance: Disheveled  Eye Contact:  Fair  Speech:  Slightly pressured  Volume:  Normal  Mood:  Euthymic  Affect:  Constricted  Thought Process:  Coherent  Orientation:  Full (Time,  Place, and Person)  Thought Content:  Illogical  Suicidal Thoughts:  No  Homicidal Thoughts:  No  Memory:  NA  Judgement:  Impaired  Insight:  Shallow  Psychomotor Activity: wnl  Concentration:  Concentration: Poor and Attention Span: Poor  Recall:  [employer] of Knowledge:  Fair  Language:  Fair  Akathisia:  No  Handed:  Right  AIMS (if indicated):     Assets:  Communication Skills Desire for Improvement  ADL's:  Intact  Cognition:  Impaired,  Mild  Sleep:  Number of Hours: 6     Treatment Plan Summary: Daily contact with patient to assess and evaluate symptoms and progress in treatment and Medication management   Patient is a 64 year old male with the above-stated past psychiatric history who is seen in follow-up.  Chart reviewed. Patient discussed with nursing. Patient appears stable, likely at his mental baseline. He is awaiting placement. Referrals sent by SW.    Plan:  -continue inpatient psych admission; 15-minute checks; daily contact with patient to assess and evaluate symptoms and progress in treatment; psychoeducation.  -continue scheduled medications without changes today. Marland Kitchen aspirin EC  81 mg Oral Daily  . clonazePAM  1 mg Oral QID  . divalproex  1,500 mg Oral QHS  . gabapentin  100 mg Oral TID  . influenza vac split quadrivalent PF  0.5 mL Intramuscular Tomorrow-1000  . lamoTRIgine  100 mg Oral QHS  . linagliptin  5 mg Oral Daily  . loratadine  10 mg Oral Daily  . metFORMIN  850 mg Oral BID WC  . metoprolol succinate  50 mg Oral Daily  . oxybutynin  10 mg Oral BID  . pantoprazole  40 mg Oral Daily  . prazosin  1 mg Oral QHS  . QUEtiapine  100 mg Oral QID  . QUEtiapine  400 mg Oral QHS  . sertraline  50 mg Oral BID  . simethicone  80 mg Oral TID  . simvastatin  20 mg Oral q1800  . tamsulosin  0.8 mg Oral QPC supper   -continue PRN medications. acetaminophen, alum & mag hydroxide-simeth, hydrOXYzine, loperamide, LORazepam, magnesium hydroxide,  ondansetron  -Disposition: to be determined.  Continuing to search for placement.  Larita Fife, MD 05/09/2020, 2:01 PM

## 2020-05-10 DIAGNOSIS — F0391 Unspecified dementia with behavioral disturbance: Secondary | ICD-10-CM | POA: Diagnosis not present

## 2020-05-10 NOTE — Progress Notes (Signed)
[facility] MD Progress Note  05/10/2020 9:51 AM Alexander Beard  MRN:  [id number]  Principal Problem: Dementia [facility]) Diagnosis: Principal Problem:   Dementia ([place]) Active Problems:   Schizoaffective disorder, bipolar type ([place])   Active autistic disorder   Diabetes ([place])   Hypertension   Prostate hypertrophy   Intellectual disability   At risk for elopement  Alexander Beard is a 64 y.o. male pt that has a previous psychiatric history of Schizoaffective disorder who presents to the [facility] unit for treatment of psychosis.  Interval History Patient was seen today for re-evaluation.  Nursing reports no events overnight. The patient reports no issues with performing ADLs.  Patient has been medication compliant.  The patient reports no side effects from medications.    SUBJECTIVE:  Patient reports he is upset that there are no any Halloween activities in the unit today. He is asking [place] to find a group home for him with activities around all holidays. He reports feeling well overall, denies feeling depressed, anxious; denies suicidal or homicidal thoughts, denies any hallucinations at the time of the interview. He denies side effects from his current medications.No current physical complaints.   Total Time spent with patient: 15 minutes  Past Psychiatric History: see H&P   Past Medical History:  Past Medical History:  Diagnosis Date  . Anxiety   . Anxiety disorder due to known physiological condition    HOSPITALIZED 10/18  . Arthritis   . Autistic disorder, residual state   . COPD (chronic obstructive pulmonary disease) ([place])   . Depression   . Developmental disorder   . Diabetes mellitus without complication ([place])   . Dyslipidemia   . Esophageal reflux   . HOH (hard of hearing)    MILDLY  . Hypertension   . Obesity   . Overactive bladder   . Palpitations    ANXIETY  . Schizophrenia, schizoaffective ([place])   . Sleep apnea   . Tremors of nervous system    HANDS DUE TO MEDICATIONS  .  Urinary incontinence     Past Surgical History:  Procedure Laterality Date  . CATARACT EXTRACTION W/PHACO Left 11/14/2017   Procedure: CATARACT EXTRACTION PHACO AND INTRAOCULAR LENS PLACEMENT (IOC);  Surgeon: Birder Robson, MD;  Location: [facility] ORS;  Service: Ophthalmology;  Laterality: Left;  [place] 00:42 AP% 14.6 CDE 6.12 Fluid pack lot # [id number]  . COLONOSCOPY  01/28/2005  . COLONOSCOPY WITH PROPOFOL N/A 05/09/2016   Procedure: COLONOSCOPY WITH PROPOFOL;  Surgeon: Manya Silvas, MD;  Location: [facility] ENDOSCOPY;  Service: Endoscopy;  Laterality: N/A;  . ESOPHAGOGASTRODUODENOSCOPY N/A 05/09/2016   Procedure: ESOPHAGOGASTRODUODENOSCOPY (EGD);  Surgeon: Manya Silvas, MD;  Location: [facility] ENDOSCOPY;  Service: Endoscopy;  Laterality: N/A;  . FRACTURE SURGERY     ORIF SHOULDER  . TONSILLECTOMY     Family History:  Family History  Problem Relation Age of Onset  . Hypertension Mother   . Stroke Father   . Heart Problems Father    Family Psychiatric  History: see H&P Social History   Substance and Sexual Activity  Alcohol Use No  . Alcohol/week: 0.0 standard drinks     Social History   Substance and Sexual Activity  Drug Use No    Social History   Socioeconomic History  . Marital status: Single    Spouse name: Not on file  . Number of children: Not on file  . Years of education: Not on file  . Highest education level: Not on file  Occupational History  .  Not on file  Tobacco Use  . Smoking status: Never Smoker  . Smokeless tobacco: Never Used  Vaping Use  . Vaping Use: Never used  Substance and Sexual Activity  . Alcohol use: No    Alcohol/week: 0.0 standard drinks  . Drug use: No  . Sexual activity: Never  Other Topics Concern  . Not on file  Social History Narrative   The patient never finished high school but did get his GED. 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 He is currently in disability and  [name] is his legal guardian. He has been living in group homes for many years.      No pending legal charges   Social Determinants of Health   Financial Resource Strain:   . Difficulty of Paying Living Expenses: Not on file  Food Insecurity:   . Worried About Charity fundraiser in the Last Year: Not on file  . Ran Out of Food in the Last Year: Not on file  Transportation Needs:   . Lack of Transportation (Medical): Not on file  . Lack of Transportation (Non-Medical): Not on file  Physical Activity:   . Days of Exercise per Week: Not on file  . Minutes of Exercise per Session: Not on file  Stress:   . Feeling of Stress : Not on file  Social Connections:   . Frequency of Communication with Friends and Family: Not on file  . Frequency of Social Gatherings with Friends and Family: Not on file  . Attends Religious Services: Not on file  . Active Member of Clubs or Organizations: Not on file  . Attends Archivist Meetings: Not on file  . Marital Status: Not on file   Additional Social History:                         Sleep: Fair  Appetite:  Fair  Current Medications: Current Facility-Administered Medications  Medication Dose Route Frequency Provider Last Rate Last Admin  . acetaminophen (TYLENOL) tablet 650 mg  650 mg Oral Q6H PRN [name], MD      . alum & mag hydroxide-simeth (MAALOX/MYLANTA) 200-200-20 MG/5ML suspension 30 mL  30 mL Oral Q4H PRN [name], MD      . aspirin EC tablet 81 mg  81 mg Oral Daily [name], [name], MD   81 mg at 05/10/20 0854  . clonazePAM (KLONOPIN) tablet 1 mg  1 mg Oral QID [name], [name], MD   1 mg at 05/10/20 0854  . divalproex (DEPAKOTE ER) 24 hr tablet 1,500 mg  1,500 mg Oral QHS [name], [name], MD   1,500 mg at 05/09/20 2136  . gabapentin (NEURONTIN) capsule 100 mg  100 mg Oral TID [name], [name], MD   100 mg at 05/10/20 0854  . hydrOXYzine (ATARAX/VISTARIL) tablet 10 mg  10 mg Oral TID PRN  [name], MD   10 mg at 04/20/20 2128  . influenza vac split quadrivalent PF (FLUARIX) injection 0.5 mL  0.5 mL Intramuscular Tomorrow-1000 [name], [name], MD      . lamoTRIgine (LAMICTAL) tablet 100 mg  100 mg Oral QHS [name], [name], MD   100 mg at 05/09/20 2137  . linagliptin (TRADJENTA) tablet 5 mg  5 mg Oral Daily [name], [name], MD   5 mg at 05/10/20 0854  . loperamide (IMODIUM) capsule 2  mg  2 mg Oral Q4H PRN [name], MD   2 mg at 04/10/20 6967  . loratadine (CLARITIN) tablet 10 mg  10 mg Oral Daily [name], [name], MD   10 mg at 05/10/20 0854  . LORazepam (ATIVAN) tablet 2 mg  2 mg Oral Q6H PRN [name], [name], MD   2 mg at 05/02/20 1211  . magnesium hydroxide (MILK OF MAGNESIA) suspension 30 mL  30 mL Oral Daily PRN [name], MD   30 mL at 03/23/20 1344  . metFORMIN (GLUCOPHAGE) tablet 850 mg  850 mg Oral BID WC [name], [name], MD   850 mg at 05/10/20 0854  . metoprolol succinate (TOPROL-XL) 24 hr tablet 50 mg  50 mg Oral Daily [name], [name], MD   50 mg at 05/10/20 0855  . ondansetron (ZOFRAN) tablet 4 mg  4 mg Oral Q8H PRN [name], [name], MD   4 mg at 04/30/20 2035  . oxybutynin (DITROPAN) tablet 10 mg  10 mg Oral BID [name], [name], MD   10 mg at 05/10/20 0854  . pantoprazole (PROTONIX) EC tablet 40 mg  40 mg Oral Daily [name], [name], MD   40 mg at 05/10/20 0854  . prazosin (MINIPRESS) capsule 1 mg  1 mg Oral QHS [name], [name], MD   1 mg at 05/09/20 2137  . QUEtiapine (SEROQUEL) tablet 100 mg  100 mg Oral QID [name], [name], MD   100 mg at 05/10/20 0855  . QUEtiapine (SEROQUEL) tablet 400 mg  400 mg Oral QHS [name], NP   400 mg at 05/09/20 2137  . sertraline (ZOLOFT) tablet 50 mg  50 mg Oral BID [name], [name], MD   50 mg at 05/10/20 0854  . simethicone (MYLICON) chewable tablet 80 mg  80 mg Oral TID [name], [name], MD   80 mg at 05/10/20 0854  . simvastatin (ZOCOR) tablet 20 mg  20 mg Oral q1800 [name], [name], MD   20 mg at 05/09/20  1720  . tamsulosin (FLOMAX) capsule 0.8 mg  0.8 mg Oral QPC supper [name], [name], MD   0.8 mg at 05/09/20 1718    Lab Results:  No results found for this or any previous visit (from the past 48 hour(s)).  Blood Alcohol level:  Lab Results  Component Value Date   ETH <10 01/22/2020   ETH <10 89/38/1017    Metabolic Disorder Labs: Lab Results  Component Value Date   HGBA1C 6.6 (H) 02/20/2020   MPG 142.72 02/20/2020   No results found for: PROLACTIN Lab Results  Component Value Date   CHOL 153 04/10/2015   TRIG 166 (H) 04/10/2015   HDL 50 04/10/2015   CHOLHDL 3.1 04/10/2015   VLDL 33 04/10/2015   LDLCALC 70 04/10/2015    Physical Findings: AIMS: Facial and Oral Movements Muscles of Facial Expression: None, normal Lips and Perioral Area: None, normal Jaw: None, normal Tongue: None, normal,Extremity Movements Upper (arms, wrists, hands, fingers): None, normal Lower (legs, knees, ankles, toes): None, normal, Trunk Movements Neck, shoulders, hips: None, normal, Overall Severity Severity of abnormal movements (highest score from questions above): None, normal Incapacitation due to abnormal movements: None, normal Patient's awareness of abnormal movements (rate only patient's report): No Awareness, Dental Status Current problems with teeth and/or dentures?: No Does patient usually wear dentures?: No  CIWA:    COWS:     Musculoskeletal: Strength & Muscle Tone: within normal limits Gait & Station: normal  Patient leans: N/A  Psychiatric Specialty Exam: Physical Exam   Review of Systems   Blood pressure 119/77, pulse 79, temperature 97.9 F (36.6 C), temperature source Oral, resp. rate 18, height 5\' 8"  (1.727 m), weight 104.3 kg, SpO2 100 %.Body mass index is 34.96 kg/m.  General Appearance: Disheveled  Eye Contact:  Fair  Speech:  Slightly pressured  Volume:  Normal  Mood:  Euthymic  Affect:  Constricted  Thought Process:  Coherent  Orientation:  Full (Time,  Place, and Person)  Thought Content:  Illogical  Suicidal Thoughts:  No  Homicidal Thoughts:  No  Memory:  NA  Judgement:  Impaired  Insight:  Shallow  Psychomotor Activity: wnl  Concentration:  Concentration: Poor and Attention Span: Poor  Recall:  [employer] of Knowledge:  Fair  Language:  Fair  Akathisia:  No  Handed:  Right  AIMS (if indicated):     Assets:  Communication Skills Desire for Improvement  ADL's:  Intact  Cognition:  Impaired,  Mild  Sleep:  Number of Hours: 6     Treatment Plan Summary: Daily contact with patient to assess and evaluate symptoms and progress in treatment and Medication management   Patient is a 64 year old male with the above-stated past psychiatric history who is seen in follow-up.  Chart reviewed. Patient discussed with nursing. Patient appears stable, likely at his mental baseline. He is awaiting placement. Referrals sent by SW.    Plan:  -continue inpatient psych admission; 15-minute checks; daily contact with patient to assess and evaluate symptoms and progress in treatment; psychoeducation.  -continue scheduled medications without changes today. Marland Kitchen aspirin EC  81 mg Oral Daily  . clonazePAM  1 mg Oral QID  . divalproex  1,500 mg Oral QHS  . gabapentin  100 mg Oral TID  . influenza vac split quadrivalent PF  0.5 mL Intramuscular Tomorrow-1000  . lamoTRIgine  100 mg Oral QHS  . linagliptin  5 mg Oral Daily  . loratadine  10 mg Oral Daily  . metFORMIN  850 mg Oral BID WC  . metoprolol succinate  50 mg Oral Daily  . oxybutynin  10 mg Oral BID  . pantoprazole  40 mg Oral Daily  . prazosin  1 mg Oral QHS  . QUEtiapine  100 mg Oral QID  . QUEtiapine  400 mg Oral QHS  . sertraline  50 mg Oral BID  . simethicone  80 mg Oral TID  . simvastatin  20 mg Oral q1800  . tamsulosin  0.8 mg Oral QPC supper   -continue PRN medications. acetaminophen, alum & mag hydroxide-simeth, hydrOXYzine, loperamide, LORazepam, magnesium hydroxide,  ondansetron  -Disposition: to be determined.  Continuing to search for placement.  Larita Fife, MD 05/10/2020, 9:51 AM

## 2020-05-10 NOTE — Progress Notes (Signed)
Patient alert and oriented x 4, affect is blunted, thoughts are organized and coherent he appears less anxious interacting appropriately with peers and staff no distress noted, he denies SI/HI/AVH will continue to monitor.

## 2020-05-10 NOTE — [facility] Group Notes (Signed)
LCSW Group Therapy Note   05/10/2020 1:05 PM- 2:00 PM    Type of Therapy and Topic: Group Therapy: Fears and Unhealthy Coping Skills   Participation Level: Active   Description of Group: The focus of this group was to discuss some of the prevalent fears that patients experience, and to identify the commonalities among group members. An exercise was used to initiate the discussion, followed by writing on the white board a group-generated list of unhealthy coping and healthy coping techniques to deal with each fear.   Therapeutic Goals: 1. Patient will identify and describe 3 fears they experience 2. Patient will identify one positive coping strategy for each fear they experience 3. Patient will respond empathically to peers statements regarding fears they experience   Summary of Patient Progress: Patient checked into group feeling alright. Patient stated his biggest fear is not being able to do the things he was doing before coming into the hospital. Patient talked about charity work and helping out in the community. Patient stated he prays and uses God as a positive coping skills to overcome his fears.     Therapeutic Modalities Cognitive Behavioral Therapy Motivational Interviewing    [name], [place] 05/10/2020 2:37 PM

## 2020-05-10 NOTE — Plan of Care (Signed)
D- Patient alert and oriented. Patient presents in a worried, but pleasant mood on assessment stating that he slept ok last night and had no complaints to voice to this Probation officer. Patient denied depression, but he did state that he was "concerned about something", and when this writer asked him what he was referring to, he stated "I think it's best if I take it up with my doctor". Patient also denies SI, HI, AVH, and pain at this time. Patient's goal for today is to "enjoy myself, self-control, rest, stay busy", in which he will "stay calm, don't let things bother me", in order to achieve his goal.  A- Scheduled medications administered to patient, per MD orders. Support and encouragement provided.  Routine safety checks conducted every 15 minutes.  Patient informed to notify staff with problems or concerns.  R- No adverse drug reactions noted. Patient contracts for safety at this time. Patient compliant with medications and treatment plan. Patient receptive, calm, and cooperative. Patient interacts well with others on the unit.  Patient remains safe at this time.  Problem: Education: Goal: Knowledge of [facility] Education information/materials will improve Outcome: Progressing Goal: Emotional status will improve Outcome: Progressing Goal: Mental status will improve Outcome: Progressing Goal: Verbalization of understanding the information provided will improve Outcome: Progressing   Problem: Activity: Goal: Interest or engagement in activities will improve Outcome: Progressing Goal: Sleeping patterns will improve Outcome: Progressing   Problem: Coping: Goal: Ability to verbalize frustrations and anger appropriately will improve Outcome: Progressing Goal: Ability to demonstrate self-control will improve Outcome: Progressing   Problem: Health Behavior/Discharge Planning: Goal: Identification of resources available to assist in meeting health care needs will improve Outcome:  Progressing Goal: Compliance with treatment plan for underlying cause of condition will improve Outcome: Progressing   Problem: Physical Regulation: Goal: Ability to maintain clinical measurements within normal limits will improve Outcome: Progressing   Problem: Safety: Goal: Periods of time without injury will increase Outcome: Progressing   Problem: Education: Goal: Ability to make informed decisions regarding treatment will improve Outcome: Progressing   Problem: Coping: Goal: Coping ability will improve Outcome: Progressing   Problem: Health Behavior/Discharge Planning: Goal: Identification of resources available to assist in meeting health care needs will improve Outcome: Progressing   Problem: Medication: Goal: Compliance with prescribed medication regimen will improve Outcome: Progressing   Problem: Self-Concept: Goal: Ability to disclose and discuss suicidal ideas will improve Outcome: Progressing Goal: Will verbalize positive feelings about self Outcome: Progressing   Problem: Education: Goal: Knowledge of [facility] Education information/materials will improve Outcome: Progressing Goal: Emotional status will improve Outcome: Progressing Goal: Mental status will improve Outcome: Progressing Goal: Verbalization of understanding the information provided will improve Outcome: Progressing   Problem: Activity: Goal: Interest or engagement in activities will improve Outcome: Progressing Goal: Sleeping patterns will improve Outcome: Progressing   Problem: Coping: Goal: Ability to verbalize frustrations and anger appropriately will improve Outcome: Progressing Goal: Ability to demonstrate self-control will improve Outcome: Progressing   Problem: Health Behavior/Discharge Planning: Goal: Identification of resources available to assist in meeting health care needs will improve Outcome: Progressing Goal: Compliance with treatment plan for underlying  cause of condition will improve Outcome: Progressing   Problem: Physical Regulation: Goal: Ability to maintain clinical measurements within normal limits will improve Outcome: Progressing   Problem: Safety: Goal: Periods of time without injury will increase Outcome: Progressing   Problem: Education: Goal: Knowledge of  General Education information/materials will improve Outcome: Progressing Goal: Emotional status  will improve Outcome: Progressing Goal: Mental status will improve Outcome: Progressing Goal: Verbalization of understanding the information provided will improve Outcome: Progressing   Problem: Activity: Goal: Interest or engagement in activities will improve Outcome: Progressing Goal: Sleeping patterns will improve Outcome: Progressing   Problem: Coping: Goal: Ability to verbalize frustrations and anger appropriately will improve Outcome: Progressing Goal: Ability to demonstrate self-control will improve Outcome: Progressing   Problem: Health Behavior/Discharge Planning: Goal: Identification of resources available to assist in meeting health care needs will improve Outcome: Progressing Goal: Compliance with treatment plan for underlying cause of condition will improve Outcome: Progressing   Problem: Physical Regulation: Goal: Ability to maintain clinical measurements within normal limits will improve Outcome: Progressing   Problem: Safety: Goal: Periods of time without injury will increase Outcome: Progressing   Problem: Education: Goal: Knowledge of [facility] Education information/materials will improve Outcome: Progressing Goal: Emotional status will improve Outcome: Progressing Goal: Mental status will improve Outcome: Progressing Goal: Verbalization of understanding the information provided will improve Outcome: Progressing   Problem: Activity: Goal: Interest or engagement in activities will improve Outcome:  Progressing Goal: Sleeping patterns will improve Outcome: Progressing   Problem: Coping: Goal: Ability to verbalize frustrations and anger appropriately will improve Outcome: Progressing Goal: Ability to demonstrate self-control will improve Outcome: Progressing   Problem: Health Behavior/Discharge Planning: Goal: Identification of resources available to assist in meeting health care needs will improve Outcome: Progressing Goal: Compliance with treatment plan for underlying cause of condition will improve Outcome: Progressing   Problem: Physical Regulation: Goal: Ability to maintain clinical measurements within normal limits will improve Outcome: Progressing   Problem: Safety: Goal: Periods of time without injury will increase Outcome: Progressing

## 2020-05-11 DIAGNOSIS — F0391 Unspecified dementia with behavioral disturbance: Secondary | ICD-10-CM | POA: Diagnosis not present

## 2020-05-11 NOTE — Progress Notes (Signed)
Recreation Therapy Notes   Date: 05/11/2020  Time: 9:30 am   Location: Craft room   Behavioral response: Appropriate  Intervention Topic: Necessities   Discussion/Intervention:  Group content on today was focused on necessities. The group defined necessities and how they determine their necessities. Individuals expressed how many necessities they have and if it changes from day to day. Patients described the difference between wants and needs. The group explained how they have overspent on wants in the past. Individuals described a reoccurring necessity for them. The intervention "What I need" helped patients differentiate between wants and needs.  Clinical Observations/Feedback: Patient came to group and defined necessities as pleasures you had in the past. He stated that his needs are based off mood and situations. Individual was social with peers and staff while participating in the intervention.  Alexander Beard LRT/CTRS         [name] 05/11/2020 10:52 AM

## 2020-05-11 NOTE — Progress Notes (Signed)
Patient reports during assessment that he is having an issue that needs to be addressed. Pt states he does not want to go to a group home with old people. Pt reports his brother has found somewhere for him to go and that he needs to be in a meeting to discuss this with him and the doctor. Patient sad but denies any SI, HI, AVH. Visible in milieu, drawing. Minimal interaction with peers. Patient is medication compliant. Encouragement and support provided. Medications given as prescribed. Voiced no other complaints or concerns. Pt receptive and remains safe on unit with q 15 min checks.

## 2020-05-11 NOTE — Plan of Care (Signed)
  Problem: Anxiety Goal: STG - Patient will identify 3 triggers to anxiety within 5 recreation therapy group sessions Description: STG - Patient will identify 3 triggers to anxiety within 5 recreation therapy group sessions Outcome: Progressing Goal: STG - Patient will identify 3 benefits of managing their anxiety in a healthy manner within 5 recreation therapy group sessions Description: STG - Patient will identify 3 benefits of managing their anxiety in a healthy manner within 5 recreation therapy group sessions Outcome: Progressing Goal: STG - Patient will demonstrate ability to practice at least 2 stress management technique independently post d/c within 5 recreation therapy group sessions Description: STG - Patient will demonstrate ability to practice at least 2 stress management technique independently post d/c within 5 recreation therapy group sessions Outcome: Progressing

## 2020-05-11 NOTE — Progress Notes (Signed)
[facility] MD Progress Note  05/11/2020 11:38 AM Alexander Beard  MRN:  [id number]  Principal Problem: Dementia [facility]) Diagnosis: Principal Problem:   Dementia ([place]) Active Problems:   Schizoaffective disorder, bipolar type ([place])   Active autistic disorder   Diabetes ([place])   Hypertension   Prostate hypertrophy   Intellectual disability   At risk for elopement  Alexander Beard is a 64 y.o. male pt that has a previous psychiatric history of Schizoaffective disorder who presents to the [facility] unit for treatment of psychosis.  Interval History Patient was seen today for re-evaluation.  Nursing reports no events overnight. The patient reports no issues with performing ADLs.  Patient has been medication compliant.  The patient reports no side effects from medications.    SUBJECTIVE:  Patient reports he was upset that he could not do anything for Halloween. He continues to fixate on getting out of the hospital and volunteering again. He hopes to be in a group home near his brother Alexander Beard, his main source of support. Helped patient contact [facility] today, and confirmed that he is not banned from volunteering in the future.  He reports feeling well overall, denies feeling depressed, anxious; denies suicidal or homicidal thoughts, denies any hallucinations at the time of the interview. He denies side effects from his current medications.No current physical complaints.   Total Time spent with patient: 15 minutes  Past Psychiatric History: see H&P   Past Medical History:  Past Medical History:  Diagnosis Date  . Anxiety   . Anxiety disorder due to known physiological condition    HOSPITALIZED 10/18  . Arthritis   . Autistic disorder, residual state   . COPD (chronic obstructive pulmonary disease) ([place])   . Depression   . Developmental disorder   . Diabetes mellitus without complication ([place])   . Dyslipidemia   . Esophageal reflux   . HOH (hard of hearing)    MILDLY  . Hypertension   . Obesity   .  Overactive bladder   . Palpitations    ANXIETY  . Schizophrenia, schizoaffective ([place])   . Sleep apnea   . Tremors of nervous system    HANDS DUE TO MEDICATIONS  . Urinary incontinence     Past Surgical History:  Procedure Laterality Date  . CATARACT EXTRACTION W/PHACO Left 11/14/2017   Procedure: CATARACT EXTRACTION PHACO AND INTRAOCULAR LENS PLACEMENT (IOC);  Surgeon: Birder Robson, MD;  Location: [facility] ORS;  Service: Ophthalmology;  Laterality: Left;  [place] 00:42 AP% 14.6 CDE 6.12 Fluid pack lot # [id number]  . COLONOSCOPY  01/28/2005  . COLONOSCOPY WITH PROPOFOL N/A 05/09/2016   Procedure: COLONOSCOPY WITH PROPOFOL;  Surgeon: Manya Silvas, MD;  Location: [facility] ENDOSCOPY;  Service: Endoscopy;  Laterality: N/A;  . ESOPHAGOGASTRODUODENOSCOPY N/A 05/09/2016   Procedure: ESOPHAGOGASTRODUODENOSCOPY (EGD);  Surgeon: Manya Silvas, MD;  Location: [facility] ENDOSCOPY;  Service: Endoscopy;  Laterality: N/A;  . FRACTURE SURGERY     ORIF SHOULDER  . TONSILLECTOMY     Family History:  Family History  Problem Relation Age of Onset  . Hypertension Mother   . Stroke Father   . Heart Problems Father    Family Psychiatric  History: see H&P Social History   Substance and Sexual Activity  Alcohol Use No  . Alcohol/week: 0.0 standard drinks     Social History   Substance and Sexual Activity  Drug Use No    Social History   Socioeconomic History  . Marital status: Single    Spouse name: Not  on file  . Number of children: Not on file  . Years of education: Not on file  . Highest education level: Not on file  Occupational History  . Not on file  Tobacco Use  . Smoking status: Never Smoker  . Smokeless tobacco: Never Used  Vaping Use  . Vaping Use: Never used  Substance and Sexual Activity  . Alcohol use: No    Alcohol/week: 0.0 standard drinks  . Drug use: No  . Sexual activity: Never  Other Topics Concern  . Not on file  Social History Narrative   The patient never  finished high school but did get his GED. 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 He is currently in disability and his brother Alexander Beard is his legal guardian. He has been living in group homes for many years.      No pending legal charges   Social Determinants of Health   Financial Resource Strain:   . Difficulty of Paying Living Expenses: Not on file  Food Insecurity:   . Worried About Charity fundraiser in the Last Year: Not on file  . Ran Out of Food in the Last Year: Not on file  Transportation Needs:   . Lack of Transportation (Medical): Not on file  . Lack of Transportation (Non-Medical): Not on file  Physical Activity:   . Days of Exercise per Week: Not on file  . Minutes of Exercise per Session: Not on file  Stress:   . Feeling of Stress : Not on file  Social Connections:   . Frequency of Communication with Friends and Family: Not on file  . Frequency of Social Gatherings with Friends and Family: Not on file  . Attends Religious Services: Not on file  . Active Member of Clubs or Organizations: Not on file  . Attends Archivist Meetings: Not on file  . Marital Status: Not on file   Additional Social History:                         Sleep: Fair  Appetite:  Fair  Current Medications: Current Facility-Administered Medications  Medication Dose Route Frequency Provider Last Rate Last Admin  . acetaminophen (TYLENOL) tablet 650 mg  650 mg Oral Q6H PRN [name], MD      . alum & mag hydroxide-simeth (MAALOX/MYLANTA) 200-200-20 MG/5ML suspension 30 mL  30 mL Oral Q4H PRN [name], MD      . aspirin EC tablet 81 mg  81 mg Oral Daily [name], [name], MD   81 mg at 05/11/20 0815  . clonazePAM (KLONOPIN) tablet 1 mg  1 mg Oral QID [name], [name], MD   1 mg at 05/11/20 0815  . divalproex (DEPAKOTE ER) 24 hr tablet 1,500 mg  1,500 mg Oral QHS [name], [name], MD   1,500 mg at 05/10/20  2032  . gabapentin (NEURONTIN) capsule 100 mg  100 mg Oral TID [name], [name], MD   100 mg at 05/11/20 0815  . hydrOXYzine (ATARAX/VISTARIL) tablet 10 mg  10 mg Oral TID PRN [name], MD   10 mg at 04/20/20 2128  . influenza vac split quadrivalent PF (FLUARIX) injection 0.5 mL  0.5 mL Intramuscular Tomorrow-1000 [name], [name], MD      . lamoTRIgine (LAMICTAL) tablet 100 mg  100 mg Oral QHS [name], [name], MD   100 mg at  05/10/20 2032  . linagliptin (TRADJENTA) tablet 5 mg  5 mg Oral Daily [name], [name], MD   5 mg at 05/11/20 0815  . loperamide (IMODIUM) capsule 2 mg  2 mg Oral Q4H PRN [name], MD   2 mg at 04/10/20 0917  . loratadine (CLARITIN) tablet 10 mg  10 mg Oral Daily [name], [name], MD   10 mg at 05/11/20 0814  . LORazepam (ATIVAN) tablet 2 mg  2 mg Oral Q6H PRN [name], [name], MD   2 mg at 05/02/20 1211  . magnesium hydroxide (MILK OF MAGNESIA) suspension 30 mL  30 mL Oral Daily PRN [name], MD   30 mL at 03/23/20 1344  . metFORMIN (GLUCOPHAGE) tablet 850 mg  850 mg Oral BID WC [name], [name], MD   850 mg at 05/11/20 0815  . metoprolol succinate (TOPROL-XL) 24 hr tablet 50 mg  50 mg Oral Daily [name], [name], MD   50 mg at 05/11/20 0814  . ondansetron (ZOFRAN) tablet 4 mg  4 mg Oral Q8H PRN [name], [name], MD   4 mg at 04/30/20 2035  . oxybutynin (DITROPAN) tablet 10 mg  10 mg Oral BID [name], [name], MD   10 mg at 05/11/20 0815  . pantoprazole (PROTONIX) EC tablet 40 mg  40 mg Oral Daily [name], [name], MD   40 mg at 05/11/20 0815  . prazosin (MINIPRESS) capsule 1 mg  1 mg Oral QHS [name], [name], MD   1 mg at 05/10/20 2032  . QUEtiapine (SEROQUEL) tablet 100 mg  100 mg Oral QID [name], [name], MD   100 mg at 05/11/20 0814  . QUEtiapine (SEROQUEL) tablet 400 mg  400 mg Oral QHS [name], NP   400 mg at 05/10/20 2032  . sertraline (ZOLOFT) tablet 50 mg  50 mg Oral BID [name], [name], MD   50 mg at 05/11/20 0815  . simethicone (MYLICON)  chewable tablet 80 mg  80 mg Oral TID [name], [name], MD   80 mg at 05/11/20 0814  . simvastatin (ZOCOR) tablet 20 mg  20 mg Oral q1800 [name], [name], MD   20 mg at 05/10/20 1727  . tamsulosin (FLOMAX) capsule 0.8 mg  0.8 mg Oral QPC supper [name], [name], MD   0.8 mg at 05/10/20 1727    Lab Results:  No results found for this or any previous visit (from the past 48 hour(s)).  Blood Alcohol level:  Lab Results  Component Value Date   ETH <10 01/22/2020   ETH <10 62/56/3893    Metabolic Disorder Labs: Lab Results  Component Value Date   HGBA1C 6.6 (H) 02/20/2020   MPG 142.72 02/20/2020   No results found for: PROLACTIN Lab Results  Component Value Date   CHOL 153 04/10/2015   TRIG 166 (H) 04/10/2015   HDL 50 04/10/2015   CHOLHDL 3.1 04/10/2015   VLDL 33 04/10/2015   LDLCALC 70 04/10/2015    Physical Findings: AIMS: Facial and Oral Movements Muscles of Facial Expression: None, normal Lips and Perioral Area: None, normal Jaw: None, normal Tongue: None, normal,Extremity Movements Upper (arms, wrists, hands, fingers): None, normal Lower (legs, knees, ankles, toes): None, normal, Trunk Movements Neck, shoulders, hips: None, normal, Overall Severity Severity of abnormal movements (highest score from questions above): None, normal Incapacitation due to abnormal movements: None, normal Patient's awareness of abnormal movements (rate only patient's report): No Awareness, Dental Status Current problems with teeth  and/or dentures?: No Does patient usually wear dentures?: No  CIWA:    COWS:     Musculoskeletal: Strength & Muscle Tone: within normal limits Gait & Station: normal Patient leans: N/A  Psychiatric Specialty Exam: Physical Exam Vitals and nursing note reviewed.  Constitutional:      Appearance: Normal appearance.  HENT:     Head: Normocephalic and atraumatic.     Right Ear: External ear normal.     Left Ear: External ear normal.     Nose: Nose normal.      Mouth/Throat:     Mouth: Mucous membranes are moist.     Pharynx: Oropharynx is clear.  Eyes:     Extraocular Movements: Extraocular movements intact.     Conjunctiva/sclera: Conjunctivae normal.     Pupils: Pupils are equal, round, and reactive to light.  Cardiovascular:     Rate and Rhythm: Normal rate.     Pulses: Normal pulses.  Pulmonary:     Effort: Pulmonary effort is normal.     Breath sounds: Normal breath sounds.  Abdominal:     General: Abdomen is flat.     Palpations: Abdomen is soft.  Musculoskeletal:        General: No swelling. Normal range of motion.     Cervical back: Normal range of motion and neck supple.  Skin:    General: Skin is warm and dry.  Neurological:     General: No focal deficit present.     Mental Status: He is alert and oriented to person, place, and time.  Psychiatric:        Attention and Perception: Attention and perception normal.        Mood and Affect: Mood is anxious. Affect is tearful.        Speech: Speech normal.        Behavior: Behavior is cooperative.        Thought Content: Thought content normal.        Cognition and Memory: Cognition and memory normal.        Judgment: Judgment normal.     Review of Systems  Constitutional: Negative for activity change and appetite change.  HENT: Negative for rhinorrhea and sore throat.   Eyes: Negative for photophobia and visual disturbance.  Respiratory: Negative for cough and shortness of breath.   Cardiovascular: Negative for chest pain and palpitations.  Gastrointestinal: Negative for constipation, diarrhea, nausea and vomiting.  Endocrine: Negative for cold intolerance and heat intolerance.  Genitourinary: Negative for difficulty urinating and dysuria.  Musculoskeletal: Negative for arthralgias and myalgias.  Skin: Negative for rash and wound.  Allergic/Immunologic: Negative for food allergies and immunocompromised state.  Neurological: Negative for dizziness and headaches.   Hematological: Negative for adenopathy. Does not bruise/bleed easily.  Psychiatric/Behavioral: Negative for behavioral problems, hallucinations and suicidal ideas.    Blood pressure 127/72, pulse 90, temperature 97.7 F (36.5 C), temperature source Oral, resp. rate 18, height 5\' 8"  (1.727 m), weight 104.3 kg, SpO2 97 %.Body mass index is 34.96 kg/m.  General Appearance: Disheveled  Eye Contact:  Fair  Speech:  Slightly pressured  Volume:  Normal  Mood:  Euthymic  Affect:  Constricted  Thought Process:  Coherent  Orientation:  Full (Time, Place, and Person)  Thought Content:  Illogical  Suicidal Thoughts:  No  Homicidal Thoughts:  No  Memory:  NA  Judgement:  Impaired  Insight:  Shallow  Psychomotor Activity: wnl  Concentration:  Concentration: Poor and Attention Span: Poor  Recall:  [place] of Knowledge:  Fair  Language:  Fair  Akathisia:  No  Handed:  Right  AIMS (if indicated):     Assets:  Communication Skills Desire for Improvement  ADL's:  Intact  Cognition:  Impaired,  Mild  Sleep:  Number of Hours: 6     Treatment Plan Summary: Daily contact with patient to assess and evaluate symptoms and progress in treatment and Medication management   Patient is a 64 year old male with the above-stated past psychiatric history who is seen in follow-up.  Chart reviewed. Patient discussed with nursing. Patient appears stable, likely at his mental baseline. He is awaiting placement. Referrals sent by SW.    Plan:  -continue inpatient psych admission; 15-minute checks; daily contact with patient to assess and evaluate symptoms and progress in treatment; psychoeducation.  -continue scheduled medications without changes today. Marland Kitchen aspirin EC  81 mg Oral Daily  . clonazePAM  1 mg Oral QID  . divalproex  1,500 mg Oral QHS  . gabapentin  100 mg Oral TID  . influenza vac split quadrivalent PF  0.5 mL Intramuscular Tomorrow-1000  . lamoTRIgine  100 mg Oral QHS  . linagliptin   5 mg Oral Daily  . loratadine  10 mg Oral Daily  . metFORMIN  850 mg Oral BID WC  . metoprolol succinate  50 mg Oral Daily  . oxybutynin  10 mg Oral BID  . pantoprazole  40 mg Oral Daily  . prazosin  1 mg Oral QHS  . QUEtiapine  100 mg Oral QID  . QUEtiapine  400 mg Oral QHS  . sertraline  50 mg Oral BID  . simethicone  80 mg Oral TID  . simvastatin  20 mg Oral q1800  . tamsulosin  0.8 mg Oral QPC supper   -continue PRN medications. acetaminophen, alum & mag hydroxide-simeth, hydrOXYzine, loperamide, LORazepam, magnesium hydroxide, ondansetron  -Disposition: to be determined.  Continuing to search for placement.  Salley Scarlet, MD 05/11/2020, 11:38 AM

## 2020-05-11 NOTE — Plan of Care (Signed)
  Problem: Education: Goal: Verbalization of understanding the information provided will improve Outcome: Progressing   Problem: Activity: Goal: Interest or engagement in activities will improve Outcome: Progressing   Problem: Coping: Goal: Ability to verbalize frustrations and anger appropriately will improve Outcome: Progressing Goal: Ability to demonstrate self-control will improve Outcome: Progressing   Problem: Coping: Goal: Ability to demonstrate self-control will improve Outcome: Progressing   Problem: Health Behavior/Discharge Planning: Goal: Compliance with treatment plan for underlying cause of condition will improve Outcome: Progressing   Problem: Safety: Goal: Periods of time without injury will increase Outcome: Progressing

## 2020-05-11 NOTE — [facility] Group Notes (Signed)
LCSW Group Therapy Note   05/11/2020 1:00 PM  Type of Therapy and Topic:  Group Therapy:  Overcoming Obstacles   Participation Level:  Did Not Attend   Description of Group:    In this group patients will be encouraged to explore what they see as obstacles to their own wellness and recovery. They will be guided to discuss their thoughts, feelings, and behaviors related to these obstacles. The group will process together ways to cope with barriers, with attention given to specific choices patients can make. Each patient will be challenged to identify changes they are motivated to make in order to overcome their obstacles. This group will be process-oriented, with patients participating in exploration of their own experiences as well as giving and receiving support and challenge from other group members.   Therapeutic Goals: 1. Patient will identify personal and current obstacles as they relate to admission. 2. Patient will identify barriers that currently interfere with their wellness or overcoming obstacles.  3. Patient will identify feelings, thought process and behaviors related to these barriers. 4. Patient will identify two changes they are willing to make to overcome these obstacles:      Summary of Patient Progress X   Therapeutic Modalities:   Cognitive Behavioral Therapy Solution Focused Therapy Motivational Interviewing Relapse Prevention Therapy  Kaj Vasil, MSW, LCSW 05/11/2020 12:53 PM    

## 2020-05-11 NOTE — Plan of Care (Signed)
Patient is trying to be independent in everything he does.This morning he tried to open one of his pill but he could not.At lunch time patient requested to try again and he could open it.Patient states " I thought my coordination is bad. But I did it." Denies SI,HI and AVH.Patient is doing ADLs.Attended groups.Appetite and energy level good.Support and encouragement given.

## 2020-05-12 DIAGNOSIS — F0391 Unspecified dementia with behavioral disturbance: Secondary | ICD-10-CM | POA: Diagnosis not present

## 2020-05-12 NOTE — Progress Notes (Signed)
Patient alert and oriented x 4, affect is blunted thoughts are disorganized and incoherent, his interaction is childlike, he appears anxious, preoccupied and fidgety staff was called to dayroom and found him tearful. Patient was offered emotional support and encouragement he was  receptive and stopped crying. Patient is complaint with medication regimen, he currently denies SI/HI/AVH, 15 minutes safety checks maintained will continue to monitor.

## 2020-05-12 NOTE — [facility] Group Notes (Addendum)
LCSW Group Therapy Note  05/12/2020 2:24 PM  Type of Therapy/Topic:  Group Therapy:  Feelings about Diagnosis  Participation Level:  Active   Description of Group:   This group will allow patients to explore their thoughts and feelings about diagnoses they have received. Patients will be guided to explore their level of understanding and acceptance of these diagnoses. Facilitator will encourage patients to process their thoughts and feelings about the reactions of others to their diagnosis and will guide patients in identifying ways to discuss their diagnosis with significant others in their lives. This group will be process-oriented, with patients participating in exploration of their own experiences, giving and receiving support, and processing challenge from other group members.   Therapeutic Goals: 1. Patient will demonstrate understanding of diagnosis as evidenced by identifying two or more symptoms of the disorder 2. Patient will be able to express two feelings regarding the diagnosis 3. Patient will demonstrate their ability to communicate their needs through discussion and/or role play  Summary of Patient Progress: Pt was engaged in the conversation although often off topic. He was focused on COVID and how it impacted him and his mental health diagnosis. Pt was able to speak about symptoms of panic attacks/anxiety and feeling overwhelmed. He was able to express his feelings of anger and frustration but specifically around [place].   Therapeutic Modalities:   Cognitive Behavioral Therapy Brief Therapy Feelings Identification   [name] R. [name], MSW, [name], [name] 05/12/2020 2:24 PM

## 2020-05-12 NOTE — Progress Notes (Signed)
[facility] MD Progress Note  05/12/2020 2:24 PM Alexander Beard  MRN:  [id number]  Principal Problem: Dementia [facility]) Diagnosis: Principal Problem:   Dementia ([place]) Active Problems:   Schizoaffective disorder, bipolar type ([place])   Active autistic disorder   Diabetes ([place])   Hypertension   Prostate hypertrophy   Intellectual disability   At risk for elopement  Alexander Beard is a 64 y.o. male pt that has a previous psychiatric history of Schizoaffective disorder who presents to the [facility] unit for treatment of psychosis.  Interval History Patient was seen today for re-evaluation.  Nursing reports no events overnight. The patient reports no issues with performing ADLs.  Patient has been medication compliant.  The patient reports no side effects from medications.    SUBJECTIVE:  Patient reports he is feeling upset today because another patient on the unit feels indifferent towards him, and does not want to be friends. Supportive psychotherapy provided to work through his own emotions, and process through how other people may be feeling. He was also encouraged to continue his deep breathing exercises in his room which he uses to cope with anxiety. He continues to fixate on getting out of the hospital and to a group home near his brother Alexander Beard, his main source of support. He denies suicidal or homicidal thoughts, denies any hallucinations at the time of the interview. He denies side effects from his current medications.No current physical complaints.   Total Time spent with patient: 30 minutes  Past Psychiatric History: see H&P   Past Medical History:  Past Medical History:  Diagnosis Date  . Anxiety   . Anxiety disorder due to known physiological condition    HOSPITALIZED 10/18  . Arthritis   . Autistic disorder, residual state   . COPD (chronic obstructive pulmonary disease) ([place])   . Depression   . Developmental disorder   . Diabetes mellitus without complication ([place])   . Dyslipidemia   .  Esophageal reflux   . HOH (hard of hearing)    MILDLY  . Hypertension   . Obesity   . Overactive bladder   . Palpitations    ANXIETY  . Schizophrenia, schizoaffective ([place])   . Sleep apnea   . Tremors of nervous system    HANDS DUE TO MEDICATIONS  . Urinary incontinence     Past Surgical History:  Procedure Laterality Date  . CATARACT EXTRACTION W/PHACO Left 11/14/2017   Procedure: CATARACT EXTRACTION PHACO AND INTRAOCULAR LENS PLACEMENT (IOC);  Surgeon: Birder Robson, MD;  Location: [facility] ORS;  Service: Ophthalmology;  Laterality: Left;  [place] 00:42 AP% 14.6 CDE 6.12 Fluid pack lot # [id number]  . COLONOSCOPY  01/28/2005  . COLONOSCOPY WITH PROPOFOL N/A 05/09/2016   Procedure: COLONOSCOPY WITH PROPOFOL;  Surgeon: Manya Silvas, MD;  Location[facility] ENDOSCOPY;  Service: Endoscopy;  Laterality: N/A;  . ESOPHAGOGASTRODUODENOSCOPY N/A 05/09/2016   Procedure: ESOPHAGOGASTRODUODENOSCOPY (EGD);  Surgeon: Manya Silvas, MD;  Location: [facility] ENDOSCOPY;  Service: Endoscopy;  Laterality: N/A;  . FRACTURE SURGERY     ORIF SHOULDER  . TONSILLECTOMY     Family History:  Family History  Problem Relation Age of Onset  . Hypertension Mother   . Stroke Father   . Heart Problems Father    Family Psychiatric  History: see H&P Social History   Substance and Sexual Activity  Alcohol Use No  . Alcohol/week: 0.0 standard drinks     Social History   Substance and Sexual Activity  Drug Use No    Social  History   Socioeconomic History  . Marital status: Single    Spouse name: Not on file  . Number of children: Not on file  . Years of education: Not on file  . Highest education level: Not on file  Occupational History  . Not on file  Tobacco Use  . Smoking status: Never Smoker  . Smokeless tobacco: Never Used  Vaping Use  . Vaping Use: Never used  Substance and Sexual Activity  . Alcohol use: No    Alcohol/week: 0.0 standard drinks  . Drug use: No  . Sexual activity: Never   Other Topics Concern  . Not on file  Social History Narrative   The patient never finished high school but did get his GED. 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 He is currently in disability and his brother Alexander Beard is his legal guardian. He has been living in group homes for many years.      No pending legal charges   Social Determinants of Health   Financial Resource Strain:   . Difficulty of Paying Living Expenses: Not on file  Food Insecurity:   . Worried About Charity fundraiser in the Last Year: Not on file  . Ran Out of Food in the Last Year: Not on file  Transportation Needs:   . Lack of Transportation (Medical): Not on file  . Lack of Transportation (Non-Medical): Not on file  Physical Activity:   . Days of Exercise per Week: Not on file  . Minutes of Exercise per Session: Not on file  Stress:   . Feeling of Stress : Not on file  Social Connections:   . Frequency of Communication with Friends and Family: Not on file  . Frequency of Social Gatherings with Friends and Family: Not on file  . Attends Religious Services: Not on file  . Active Member of Clubs or Organizations: Not on file  . Attends Archivist Meetings: Not on file  . Marital Status: Not on file   Additional Social History:                         Sleep: Fair  Appetite:  Fair  Current Medications: Current Facility-Administered Medications  Medication Dose Route Frequency Provider Last Rate Last Admin  . acetaminophen (TYLENOL) tablet 650 mg  650 mg Oral Q6H PRN [name], MD      . alum & mag hydroxide-simeth (MAALOX/MYLANTA) 200-200-20 MG/5ML suspension 30 mL  30 mL Oral Q4H PRN [name], MD      . aspirin EC tablet 81 mg  81 mg Oral Daily [name], [name], MD   81 mg at 05/12/20 5053  . clonazePAM (KLONOPIN) tablet 1 mg  1 mg Oral QID [name], [name], MD   1 mg at 05/12/20 1241  . divalproex (DEPAKOTE  ER) 24 hr tablet 1,500 mg  1,500 mg Oral QHS [name], [name], MD   1,500 mg at 05/11/20 2140  . gabapentin (NEURONTIN) capsule 100 mg  100 mg Oral TID [name], [name], MD   100 mg at 05/12/20 1241  . hydrOXYzine (ATARAX/VISTARIL) tablet 10 mg  10 mg Oral TID PRN [name], MD   10 mg at 04/20/20 2128  . influenza vac split quadrivalent PF (FLUARIX) injection 0.5 mL  0.5 mL Intramuscular Tomorrow-1000 [name], [name], MD      . lamoTRIgine (LAMICTAL) tablet  100 mg  100 mg Oral QHS [name], [name], MD   100 mg at 05/11/20 2140  . linagliptin (TRADJENTA) tablet 5 mg  5 mg Oral Daily [name], [name], MD   5 mg at 05/12/20 0823  . loperamide (IMODIUM) capsule 2 mg  2 mg Oral Q4H PRN [name], MD   2 mg at 04/10/20 0917  . loratadine (CLARITIN) tablet 10 mg  10 mg Oral Daily [name], [name], MD   10 mg at 05/12/20 2229  . LORazepam (ATIVAN) tablet 2 mg  2 mg Oral Q6H PRN [name], [name], MD   2 mg at 05/02/20 1211  . magnesium hydroxide (MILK OF MAGNESIA) suspension 30 mL  30 mL Oral Daily PRN [name], MD   30 mL at 03/23/20 1344  . metFORMIN (GLUCOPHAGE) tablet 850 mg  850 mg Oral BID WC [name], [name], MD   850 mg at 05/12/20 0824  . metoprolol succinate (TOPROL-XL) 24 hr tablet 50 mg  50 mg Oral Daily [name], [name], MD   50 mg at 05/12/20 0823  . ondansetron (ZOFRAN) tablet 4 mg  4 mg Oral Q8H PRN [name], [name], MD   4 mg at 04/30/20 2035  . oxybutynin (DITROPAN) tablet 10 mg  10 mg Oral BID [name], [name], MD   10 mg at 05/12/20 0824  . pantoprazole (PROTONIX) EC tablet 40 mg  40 mg Oral Daily [name], [name], MD   40 mg at 05/12/20 7989  . prazosin (MINIPRESS) capsule 1 mg  1 mg Oral QHS [name], [name], MD   1 mg at 05/11/20 2140  . QUEtiapine (SEROQUEL) tablet 100 mg  100 mg Oral QID [name], [name], MD   100 mg at 05/12/20 1240  . QUEtiapine (SEROQUEL) tablet 400 mg  400 mg Oral QHS [name], NP   400 mg at 05/11/20 2140  . sertraline (ZOLOFT) tablet 50 mg   50 mg Oral BID [name], [name], MD   50 mg at 05/12/20 2119  . simethicone (MYLICON) chewable tablet 80 mg  80 mg Oral TID [name], [name], MD   80 mg at 05/12/20 1241  . simvastatin (ZOCOR) tablet 20 mg  20 mg Oral q1800 [name], [name], MD   20 mg at 05/11/20 1713  . tamsulosin (FLOMAX) capsule 0.8 mg  0.8 mg Oral QPC supper [name], [name], MD   0.8 mg at 05/11/20 1712    Lab Results:  No results found for this or any previous visit (from the past 48 hour(s)).  Blood Alcohol level:  Lab Results  Component Value Date   ETH <10 01/22/2020   ETH <10 41/74/0814    Metabolic Disorder Labs: Lab Results  Component Value Date   HGBA1C 6.6 (H) 02/20/2020   MPG 142.72 02/20/2020   No results found for: PROLACTIN Lab Results  Component Value Date   CHOL 153 04/10/2015   TRIG 166 (H) 04/10/2015   HDL 50 04/10/2015   CHOLHDL 3.1 04/10/2015   VLDL 33 04/10/2015   LDLCALC 70 04/10/2015    Physical Findings: AIMS: Facial and Oral Movements Muscles of Facial Expression: None, normal Lips and Perioral Area: None, normal Jaw: None, normal Tongue: None, normal,Extremity Movements Upper (arms, wrists, hands, fingers): None, normal Lower (legs, knees, ankles, toes): None, normal, Trunk Movements Neck, shoulders, hips: None, normal, Overall Severity Severity of abnormal movements (highest score from questions above): None, normal Incapacitation due to abnormal movements: None, normal Patient's  awareness of abnormal movements (rate only patient's report): No Awareness, Dental Status Current problems with teeth and/or dentures?: No Does patient usually wear dentures?: No  CIWA:    COWS:     Musculoskeletal: Strength & Muscle Tone: within normal limits Gait & Station: normal Patient leans: N/A  Psychiatric Specialty Exam: Physical Exam Vitals and nursing note reviewed.  Constitutional:      Appearance: Normal appearance.  HENT:     Head: Normocephalic and atraumatic.     Right  Ear: External ear normal.     Left Ear: External ear normal.     Nose: Nose normal.     Mouth/Throat:     Mouth: Mucous membranes are moist.     Pharynx: Oropharynx is clear.  Eyes:     Extraocular Movements: Extraocular movements intact.     Conjunctiva/sclera: Conjunctivae normal.     Pupils: Pupils are equal, round, and reactive to light.  Cardiovascular:     Rate and Rhythm: Normal rate.     Pulses: Normal pulses.  Pulmonary:     Effort: Pulmonary effort is normal.     Breath sounds: Normal breath sounds.  Abdominal:     General: Abdomen is flat.     Palpations: Abdomen is soft.  Musculoskeletal:        General: No swelling. Normal range of motion.     Cervical back: Normal range of motion and neck supple.  Skin:    General: Skin is warm and dry.  Neurological:     General: No focal deficit present.     Mental Status: He is alert and oriented to person, place, and time.  Psychiatric:        Attention and Perception: Attention and perception normal.        Mood and Affect: Mood is anxious. Affect is tearful.        Speech: Speech normal.        Behavior: Behavior is cooperative.        Thought Content: Thought content normal.        Cognition and Memory: Cognition and memory normal.        Judgment: Judgment normal.     Review of Systems  Constitutional: Negative for activity change and appetite change.  HENT: Negative for rhinorrhea and sore throat.   Eyes: Negative for photophobia and visual disturbance.  Respiratory: Negative for cough and shortness of breath.   Cardiovascular: Negative for chest pain and palpitations.  Gastrointestinal: Negative for constipation, diarrhea, nausea and vomiting.  Endocrine: Negative for cold intolerance and heat intolerance.  Genitourinary: Negative for difficulty urinating and dysuria.  Musculoskeletal: Negative for arthralgias and myalgias.  Skin: Negative for rash and wound.  Allergic/Immunologic: Negative for food allergies  and immunocompromised state.  Neurological: Negative for dizziness and headaches.  Hematological: Negative for adenopathy. Does not bruise/bleed easily.  Psychiatric/Behavioral: Negative for behavioral problems, hallucinations and suicidal ideas.    Blood pressure 139/79, pulse 85, temperature 97.7 F (36.5 C), temperature source Oral, resp. rate 18, height 5\' 8"  (0.865 m), weight 104.3 kg, SpO2 98 %.Body mass index is 34.96 kg/m.  General Appearance: Disheveled  Eye Contact:  Fair  Speech:  Normal  Volume:  Normal  Mood:  Anxious  Affect:  Congruent and Tearful  Thought Process:  Coherent  Orientation:  Full (Time, Place, and Person)  Thought Content:  Logical  Suicidal Thoughts:  No  Homicidal Thoughts:  No  Memory:  Immediate;   Fair Recent;   Fair  Remote;   Fair  Judgement:  Impaired  Insight:  Shallow  Psychomotor Activity: wnl  Concentration:  Concentration: Fair and Attention Span: Fair  Recall:  [employer] of Knowledge:  Fair  Language:  Fair  Akathisia:  No  Handed:  Right  AIMS (if indicated):     Assets:  Communication Skills Desire for Improvement Resilience Social Support  ADL's:  Intact  Cognition:  Impaired,  Mild  Sleep:  Number of Hours: 6.15     Treatment Plan Summary: Daily contact with patient to assess and evaluate symptoms and progress in treatment and Medication management   Patient is a 64 year old male with the above-stated past psychiatric history who is seen in follow-up.  Chart reviewed. Patient discussed with nursing. Patient appears stable, likely at his mental baseline. He is awaiting placement. Referrals sent by SW.    Plan:  -continue inpatient psych admission; 15-minute checks; daily contact with patient to assess and evaluate symptoms and progress in treatment; psychoeducation.  -continue scheduled medications without changes today. Marland Kitchen aspirin EC  81 mg Oral Daily  . clonazePAM  1 mg Oral QID  . divalproex  1,500 mg Oral QHS   . gabapentin  100 mg Oral TID  . influenza vac split quadrivalent PF  0.5 mL Intramuscular Tomorrow-1000  . lamoTRIgine  100 mg Oral QHS  . linagliptin  5 mg Oral Daily  . loratadine  10 mg Oral Daily  . metFORMIN  850 mg Oral BID WC  . metoprolol succinate  50 mg Oral Daily  . oxybutynin  10 mg Oral BID  . pantoprazole  40 mg Oral Daily  . prazosin  1 mg Oral QHS  . QUEtiapine  100 mg Oral QID  . QUEtiapine  400 mg Oral QHS  . sertraline  50 mg Oral BID  . simethicone  80 mg Oral TID  . simvastatin  20 mg Oral q1800  . tamsulosin  0.8 mg Oral QPC supper   -continue PRN medications. acetaminophen, alum & mag hydroxide-simeth, hydrOXYzine, loperamide, LORazepam, magnesium hydroxide, ondansetron  -Disposition: to be determined.  Continuing to search for placement.  Salley Scarlet, MD 05/12/2020, 2:24 PM

## 2020-05-12 NOTE — Care Management Note (Signed)
CMA contacted Alexander Beard at [phone] to inquire about bed availability,beds are available, FL2, H &P and most recent physician progress notes faxed to [phone].

## 2020-05-12 NOTE — Plan of Care (Signed)
Pt rates depression and anxiety both at 10/10. Pt denies SI,  HI and AVH. Pt was educated on care plan and verbalizes understanding. Pt encouraged to attend groups. Collier Bullock RN Problem: Education: Goal: Emotional status will improve Outcome: Not Progressing Goal: Mental status will improve Outcome: Not Progressing

## 2020-05-12 NOTE — Progress Notes (Signed)
D- Patient alert and oriented. Affect/mood. Pt denies SI, HI, AVH, and pain. Patient's mood has improved since this morning. Pt went to a group. Pt has been watching television and working on his art. He states that he fells better.   A- Scheduled medications administered to patient, per MD orders. Support and encouragement provided.  Routine safety checks conducted every 15 minutes.  Patient informed to notify staff with problems or concerns.  R- No adverse drug reactions noted. Patient contracts for safety at this time. Patient compliant with medications and treatment plan. Patient receptive, calm, and cooperative. Patient interacts well with others on the unit.  Patient remains safe at this time.  Collier Bullock RN

## 2020-05-13 DIAGNOSIS — F0391 Unspecified dementia with behavioral disturbance: Secondary | ICD-10-CM | POA: Diagnosis not present

## 2020-05-13 NOTE — Progress Notes (Signed)
[facility] MD Progress Note  05/13/2020 12:49 PM DIAZ CRAGO  MRN:  [id number]  Principal Problem: Dementia [facility]) Diagnosis: Principal Problem:   Dementia ([place]) Active Problems:   Schizoaffective disorder, bipolar type ([place])   Active autistic disorder   Diabetes ([place])   Hypertension   Prostate hypertrophy   Intellectual disability   At risk for elopement  [name] is a 64 y.o. male pt that has a previous psychiatric history of Schizoaffective disorder who presents to the [facility] unit for treatment of psychosis.  Interval History Patient was seen today for re-evaluation.  Nursing reports no events overnight. The patient reports no issues with performing ADLs.  Patient has been medication compliant.  The patient reports no side effects from medications.    SUBJECTIVE:  Patient reports he is feeling upset today because all of his friends are leaving the hospital. He hopes that he will one day leave the hospital as well. He was also encouraged to continue his deep breathing exercises in his room which he uses to cope with anxiety. He continues to fixate on getting out of the hospital and to a group home near his brother Alexander Beard, his main source of support. He denies suicidal or homicidal thoughts, denies any hallucinations at the time of the interview. He denies side effects from his current medications.No current physical complaints.   Total Time spent with patient: 30 minutes  Past Psychiatric History: see H&P   Past Medical History:  Past Medical History:  Diagnosis Date  . Anxiety   . Anxiety disorder due to known physiological condition    HOSPITALIZED 10/18  . Arthritis   . Autistic disorder, residual state   . COPD (chronic obstructive pulmonary disease) ([place])   . Depression   . Developmental disorder   . Diabetes mellitus without complication ([place])   . Dyslipidemia   . Esophageal reflux   . HOH (hard of hearing)    MILDLY  . Hypertension   . Obesity   . Overactive bladder    . Palpitations    ANXIETY  . Schizophrenia, schizoaffective ([place])   . Sleep apnea   . Tremors of nervous system    HANDS DUE TO MEDICATIONS  . Urinary incontinence     Past Surgical History:  Procedure Laterality Date  . CATARACT EXTRACTION W/PHACO Left 11/14/2017   Procedure: CATARACT EXTRACTION PHACO AND INTRAOCULAR LENS PLACEMENT (IOC);  Surgeon: Birder Robson, MD;  Location: [facility] ORS;  Service: Ophthalmology;  Laterality: Left;  [place] 00:42 AP% 14.6 CDE 6.12 Fluid pack lot # [id number]  . COLONOSCOPY  01/28/2005  . COLONOSCOPY WITH PROPOFOL N/A 05/09/2016   Procedure: COLONOSCOPY WITH PROPOFOL;  Surgeon: Manya Silvas, MD;  Location[facility] ENDOSCOPY;  Service: Endoscopy;  Laterality: N/A;  . ESOPHAGOGASTRODUODENOSCOPY N/A 05/09/2016   Procedure: ESOPHAGOGASTRODUODENOSCOPY (EGD);  Surgeon: Manya Silvas, MD;  Location: [facility] ENDOSCOPY;  Service: Endoscopy;  Laterality: N/A;  . FRACTURE SURGERY     ORIF SHOULDER  . TONSILLECTOMY     Family History:  Family History  Problem Relation Age of Onset  . Hypertension Mother   . Stroke Father   . Heart Problems Father    Family Psychiatric  History: see H&P Social History   Substance and Sexual Activity  Alcohol Use No  . Alcohol/week: 0.0 standard drinks     Social History   Substance and Sexual Activity  Drug Use No    Social History   Socioeconomic History  . Marital status: Single    Spouse  name: Not on file  . Number of children: Not on file  . Years of education: Not on file  . Highest education level: Not on file  Occupational History  . Not on file  Tobacco Use  . Smoking status: Never Smoker  . Smokeless tobacco: Never Used  Vaping Use  . Vaping Use: Never used  Substance and Sexual Activity  . Alcohol use: No    Alcohol/week: 0.0 standard drinks  . Drug use: No  . Sexual activity: Never  Other Topics Concern  . Not on file  Social History Narrative   The patient never finished high school but  did get his GED. 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 He is currently in disability and his brother Alexander Beard is his legal guardian. He has been living in group homes for many years.      No pending legal charges   Social Determinants of Health   Financial Resource Strain:   . Difficulty of Paying Living Expenses: Not on file  Food Insecurity:   . Worried About Charity fundraiser in the Last Year: Not on file  . Ran Out of Food in the Last Year: Not on file  Transportation Needs:   . Lack of Transportation (Medical): Not on file  . Lack of Transportation (Non-Medical): Not on file  Physical Activity:   . Days of Exercise per Week: Not on file  . Minutes of Exercise per Session: Not on file  Stress:   . Feeling of Stress : Not on file  Social Connections:   . Frequency of Communication with Friends and Family: Not on file  . Frequency of Social Gatherings with Friends and Family: Not on file  . Attends Religious Services: Not on file  . Active Member of Clubs or Organizations: Not on file  . Attends Archivist Meetings: Not on file  . Marital Status: Not on file   Additional Social History:                         Sleep: Fair  Appetite:  Fair  Current Medications: Current Facility-Administered Medications  Medication Dose Route Frequency Provider Last Rate Last Admin  . acetaminophen (TYLENOL) tablet 650 mg  650 mg Oral Q6H PRN [name], MD      . alum & mag hydroxide-simeth (MAALOX/MYLANTA) 200-200-20 MG/5ML suspension 30 mL  30 mL Oral Q4H PRN [name], MD      . aspirin EC tablet 81 mg  81 mg Oral Daily [name], [name], MD   81 mg at 05/13/20 0825  . clonazePAM (KLONOPIN) tablet 1 mg  1 mg Oral QID [name], [name], MD   1 mg at 05/13/20 1224  . divalproex (DEPAKOTE ER) 24 hr tablet 1,500 mg  1,500 mg Oral QHS [name], [name], MD   1,500 mg at 05/12/20 2128  . gabapentin  (NEURONTIN) capsule 100 mg  100 mg Oral TID [name], [name], MD   100 mg at 05/13/20 1224  . hydrOXYzine (ATARAX/VISTARIL) tablet 10 mg  10 mg Oral TID PRN [name], MD   10 mg at 04/20/20 2128  . influenza vac split quadrivalent PF (FLUARIX) injection 0.5 mL  0.5 mL Intramuscular Tomorrow-1000 [name], [name], MD      . lamoTRIgine (LAMICTAL) tablet 100 mg  100 mg Oral QHS [name], [name], MD   100  mg at 05/12/20 2128  . linagliptin (TRADJENTA) tablet 5 mg  5 mg Oral Daily [name], [name], MD   5 mg at 05/13/20 0825  . loperamide (IMODIUM) capsule 2 mg  2 mg Oral Q4H PRN [name], MD   2 mg at 04/10/20 0917  . loratadine (CLARITIN) tablet 10 mg  10 mg Oral Daily [name], [name], MD   10 mg at 05/13/20 0826  . LORazepam (ATIVAN) tablet 2 mg  2 mg Oral Q6H PRN [name], [name], MD   2 mg at 05/02/20 1211  . magnesium hydroxide (MILK OF MAGNESIA) suspension 30 mL  30 mL Oral Daily PRN [name], MD   30 mL at 03/23/20 1344  . metFORMIN (GLUCOPHAGE) tablet 850 mg  850 mg Oral BID WC [name], [name], MD   850 mg at 05/13/20 0825  . metoprolol succinate (TOPROL-XL) 24 hr tablet 50 mg  50 mg Oral Daily [name], [name], MD   50 mg at 05/13/20 0827  . ondansetron (ZOFRAN) tablet 4 mg  4 mg Oral Q8H PRN [name], [name], MD   4 mg at 04/30/20 2035  . oxybutynin (DITROPAN) tablet 10 mg  10 mg Oral BID [name], [name], MD   10 mg at 05/13/20 0826  . pantoprazole (PROTONIX) EC tablet 40 mg  40 mg Oral Daily [name], [name], MD   40 mg at 05/13/20 0827  . prazosin (MINIPRESS) capsule 1 mg  1 mg Oral QHS [name], [name], MD   1 mg at 05/11/20 2140  . QUEtiapine (SEROQUEL) tablet 100 mg  100 mg Oral QID [name], [name], MD   100 mg at 05/13/20 1224  . QUEtiapine (SEROQUEL) tablet 400 mg  400 mg Oral QHS [name], NP   400 mg at 05/12/20 2127  . sertraline (ZOLOFT) tablet 50 mg  50 mg Oral BID [name], [name], MD   50 mg at 05/13/20 0826  . simethicone (MYLICON) chewable tablet 80 mg   80 mg Oral TID [name], [name], MD   80 mg at 05/13/20 1223  . simvastatin (ZOCOR) tablet 20 mg  20 mg Oral q1800 [name], [name], MD   20 mg at 05/12/20 1712  . tamsulosin (FLOMAX) capsule 0.8 mg  0.8 mg Oral QPC supper [name], [name], MD   0.8 mg at 05/12/20 1713    Lab Results:  No results found for this or any previous visit (from the past 48 hour(s)).  Blood Alcohol level:  Lab Results  Component Value Date   ETH <10 01/22/2020   ETH <10 24/40/1027    Metabolic Disorder Labs: Lab Results  Component Value Date   HGBA1C 6.6 (H) 02/20/2020   MPG 142.72 02/20/2020   No results found for: PROLACTIN Lab Results  Component Value Date   CHOL 153 04/10/2015   TRIG 166 (H) 04/10/2015   HDL 50 04/10/2015   CHOLHDL 3.1 04/10/2015   VLDL 33 04/10/2015   LDLCALC 70 04/10/2015    Physical Findings: AIMS: Facial and Oral Movements Muscles of Facial Expression: None, normal Lips and Perioral Area: None, normal Jaw: None, normal Tongue: None, normal,Extremity Movements Upper (arms, wrists, hands, fingers): None, normal Lower (legs, knees, ankles, toes): None, normal, Trunk Movements Neck, shoulders, hips: None, normal, Overall Severity Severity of abnormal movements (highest score from questions above): None, normal Incapacitation due to abnormal movements: None, normal Patient's awareness of abnormal movements (rate only patient's report): No Awareness, Dental Status Current problems  with teeth and/or dentures?: No Does patient usually wear dentures?: No  CIWA:    COWS:     Musculoskeletal: Strength & Muscle Tone: within normal limits Gait & Station: normal Patient leans: N/A  Psychiatric Specialty Exam: Physical Exam Vitals and nursing note reviewed.  Constitutional:      Appearance: Normal appearance.  HENT:     Head: Normocephalic and atraumatic.     Right Ear: External ear normal.     Left Ear: External ear normal.     Nose: Nose normal.     Mouth/Throat:      Mouth: Mucous membranes are moist.     Pharynx: Oropharynx is clear.  Eyes:     Extraocular Movements: Extraocular movements intact.     Conjunctiva/sclera: Conjunctivae normal.     Pupils: Pupils are equal, round, and reactive to light.  Cardiovascular:     Rate and Rhythm: Normal rate.     Pulses: Normal pulses.  Pulmonary:     Effort: Pulmonary effort is normal.     Breath sounds: Normal breath sounds.  Abdominal:     General: Abdomen is flat.     Palpations: Abdomen is soft.  Musculoskeletal:        General: No swelling. Normal range of motion.     Cervical back: Normal range of motion and neck supple.  Skin:    General: Skin is warm and dry.  Neurological:     General: No focal deficit present.     Mental Status: He is alert and oriented to person, place, and time.  Psychiatric:        Attention and Perception: Attention and perception normal.        Mood and Affect: Mood is anxious. Affect is not tearful.        Speech: Speech normal.        Behavior: Behavior is cooperative.        Thought Content: Thought content normal.        Cognition and Memory: Cognition and memory normal.        Judgment: Judgment normal.     Review of Systems  Constitutional: Negative for activity change and appetite change.  HENT: Negative for rhinorrhea and sore throat.   Eyes: Negative for photophobia and visual disturbance.  Respiratory: Negative for cough and shortness of breath.   Cardiovascular: Negative for chest pain and palpitations.  Gastrointestinal: Negative for constipation, diarrhea, nausea and vomiting.  Endocrine: Negative for cold intolerance and heat intolerance.  Genitourinary: Negative for difficulty urinating and dysuria.  Musculoskeletal: Negative for arthralgias and myalgias.  Skin: Negative for rash and wound.  Allergic/Immunologic: Negative for food allergies and immunocompromised state.  Neurological: Negative for dizziness and headaches.  Hematological: Negative  for adenopathy. Does not bruise/bleed easily.  Psychiatric/Behavioral: Negative for behavioral problems, hallucinations and suicidal ideas.    Blood pressure 131/82, pulse 68, temperature 98.1 F (36.7 C), temperature source Oral, resp. rate 18, height 5\' 8"  (1.727 m), weight 104.3 kg, SpO2 98 %.Body mass index is 34.96 kg/m.  General Appearance: Disheveled  Eye Contact:  Fair  Speech:  Normal  Volume:  Normal  Mood:  Anxious  Affect:  Congruent and Tearful  Thought Process:  Coherent  Orientation:  Full (Time, Place, and Person)  Thought Content:  Logical  Suicidal Thoughts:  No  Homicidal Thoughts:  No  Memory:  Immediate;   Fair Recent;   Fair Remote;   Fair  Judgement:  Impaired  Insight:  Shallow  Psychomotor Activity: wnl  Concentration:  Concentration: Fair and Attention Span: Fair  Recall:  [employer] of Knowledge:  Fair  Language:  Fair  Akathisia:  No  Handed:  Right  AIMS (if indicated):     Assets:  Communication Skills Desire for Improvement Resilience Social Support  ADL's:  Intact  Cognition:  Impaired,  Mild  Sleep:  Number of Hours: 7     Treatment Plan Summary: Daily contact with patient to assess and evaluate symptoms and progress in treatment and Medication management   Patient is a 64 year old male with the above-stated past psychiatric history who is seen in follow-up.  Chart reviewed. Patient discussed with nursing. Patient appears stable, likely at his mental baseline. He is awaiting placement. Referrals sent by SW.    Plan:  -continue inpatient psych admission; 15-minute checks; daily contact with patient to assess and evaluate symptoms and progress in treatment; psychoeducation.  -continue scheduled medications without changes today. Marland Kitchen aspirin EC  81 mg Oral Daily  . clonazePAM  1 mg Oral QID  . divalproex  1,500 mg Oral QHS  . gabapentin  100 mg Oral TID  . influenza vac split quadrivalent PF  0.5 mL Intramuscular Tomorrow-1000  .  lamoTRIgine  100 mg Oral QHS  . linagliptin  5 mg Oral Daily  . loratadine  10 mg Oral Daily  . metFORMIN  850 mg Oral BID WC  . metoprolol succinate  50 mg Oral Daily  . oxybutynin  10 mg Oral BID  . pantoprazole  40 mg Oral Daily  . prazosin  1 mg Oral QHS  . QUEtiapine  100 mg Oral QID  . QUEtiapine  400 mg Oral QHS  . sertraline  50 mg Oral BID  . simethicone  80 mg Oral TID  . simvastatin  20 mg Oral q1800  . tamsulosin  0.8 mg Oral QPC supper   -continue PRN medications. acetaminophen, alum & mag hydroxide-simeth, hydrOXYzine, loperamide, LORazepam, magnesium hydroxide, ondansetron  -Disposition: to be determined.  Continuing to search for placement.  Salley Scarlet, MD 05/13/2020, 12:49 PM

## 2020-05-13 NOTE — Progress Notes (Signed)
D- Patient alert and oriented. Affect/mood is sad and depressed. Pt denies SI, HI, AVH, and pain.   A- Scheduled medications administered to patient, per MD orders. Support and encouragement provided.  Routine safety checks conducted every 15 minutes.  Patient informed to notify staff with problems or concerns.  R- No adverse drug reactions noted. Patient contracts for safety at this time. Patient compliant with medications and treatment plan. Patient receptive, calm, and cooperative. Patient interacts well with others on the unit.  Patient remains safe at this time.  Collier Bullock RN

## 2020-05-13 NOTE — BHH Counselor (Signed)
CSW called Scot Jun at [phone] with [facility].  CSW left HIPAA compliant voicemail.  Assunta Curtis, MSW, LCSW 05/13/2020 3:56 PM

## 2020-05-13 NOTE — Plan of Care (Signed)
Pt denies depression, anxiety, SI, HI and AVH. Pt was educated on care plan and verbalizes understanding. Collier Bullock RN Problem: Education: Goal: Knowledge of [place] [facility] Education information/materials will improve Outcome: Progressing Goal: Emotional status will improve Outcome: Progressing Goal: Mental status will improve Outcome: Progressing Goal: Verbalization of understanding the information provided will improve Outcome: Progressing   Problem: Activity: Goal: Interest or engagement in activities will improve Outcome: Progressing Goal: Sleeping patterns will improve Outcome: Progressing   Problem: Coping: Goal: Ability to verbalize frustrations and anger appropriately will improve Outcome: Progressing Goal: Ability to demonstrate self-control will improve Outcome: Progressing   Problem: Health Behavior/Discharge Planning: Goal: Identification of resources available to assist in meeting health care needs will improve Outcome: Progressing Goal: Compliance with treatment plan for underlying cause of condition will improve Outcome: Progressing   Problem: Physical Regulation: Goal: Ability to maintain clinical measurements within normal limits will improve Outcome: Progressing   Problem: Safety: Goal: Periods of time without injury will increase Outcome: Progressing   Problem: Education: Goal: Knowledge of [place] [facility] Education information/materials will improve Outcome: Progressing Goal: Emotional status will improve Outcome: Progressing Goal: Mental status will improve Outcome: Progressing Goal: Verbalization of understanding the information provided will improve Outcome: Progressing   Problem: Activity: Goal: Interest or engagement in activities will improve Outcome: Progressing Goal: Sleeping patterns will improve Outcome: Progressing   Problem: Coping: Goal: Ability to verbalize frustrations and anger appropriately will improve Outcome:  Progressing Goal: Ability to demonstrate self-control will improve Outcome: Progressing   Problem: Health Behavior/Discharge Planning: Goal: Identification of resources available to assist in meeting health care needs will improve Outcome: Progressing Goal: Compliance with treatment plan for underlying cause of condition will improve Outcome: Progressing   Problem: Physical Regulation: Goal: Ability to maintain clinical measurements within normal limits will improve Outcome: Progressing   Problem: Safety: Goal: Periods of time without injury will increase Outcome: Progressing   Problem: Education: Goal: Knowledge of [place] [facility] Education information/materials will improve Outcome: Progressing Goal: Emotional status will improve Outcome: Progressing Goal: Mental status will improve Outcome: Progressing Goal: Verbalization of understanding the information provided will improve Outcome: Progressing   Problem: Activity: Goal: Interest or engagement in activities will improve Outcome: Progressing Goal: Sleeping patterns will improve Outcome: Progressing   Problem: Coping: Goal: Ability to verbalize frustrations and anger appropriately will improve Outcome: Progressing Goal: Ability to demonstrate self-control will improve Outcome: Progressing   Problem: Health Behavior/Discharge Planning: Goal: Identification of resources available to assist in meeting health care needs will improve Outcome: Progressing Goal: Compliance with treatment plan for underlying cause of condition will improve Outcome: Progressing   Problem: Physical Regulation: Goal: Ability to maintain clinical measurements within normal limits will improve Outcome: Progressing   Problem: Safety: Goal: Periods of time without injury will increase Outcome: Progressing   Problem: Education: Goal: Knowledge of [place] [facility] Education information/materials will improve Outcome:  Progressing Goal: Emotional status will improve Outcome: Progressing Goal: Mental status will improve Outcome: Progressing Goal: Verbalization of understanding the information provided will improve Outcome: Progressing   Problem: Activity: Goal: Interest or engagement in activities will improve Outcome: Progressing Goal: Sleeping patterns will improve Outcome: Progressing   Problem: Coping: Goal: Ability to verbalize frustrations and anger appropriately will improve Outcome: Progressing Goal: Ability to demonstrate self-control will improve Outcome: Progressing   Problem: Health Behavior/Discharge Planning: Goal: Identification of resources available to assist in meeting health care needs will improve Outcome: Progressing Goal: Compliance with treatment plan for  underlying cause of condition will improve Outcome: Progressing   Problem: Physical Regulation: Goal: Ability to maintain clinical measurements within normal limits will improve Outcome: Progressing   Problem: Safety: Goal: Periods of time without injury will increase Outcome: Progressing   Problem: Education: Goal: Ability to make informed decisions regarding treatment will improve Outcome: Progressing   Problem: Coping: Goal: Coping ability will improve Outcome: Progressing   Problem: Health Behavior/Discharge Planning: Goal: Identification of resources available to assist in meeting health care needs will improve Outcome: Progressing   Problem: Medication: Goal: Compliance with prescribed medication regimen will improve Outcome: Progressing   Problem: Self-Concept: Goal: Ability to disclose and discuss suicidal ideas will improve Outcome: Progressing Goal: Will verbalize positive feelings about self Outcome: Progressing

## 2020-05-13 NOTE — Progress Notes (Signed)
Patient alert and oriented x 4, affect is blunted, thoughts are organized and coherent he appears less anxious, no crying spells no distress this shift he is interacting appropriately with peers and staff no distress noted, he denies SI/HI/AVH, complaint with medication regimen, 15 minutes safety checks maintained will continue to monitor. Marland Kitchen

## 2020-05-13 NOTE — Progress Notes (Signed)
Recreation Therapy Notes   Date: 05/13/2020  Time: 9:30 am   Location: Craft room  Behavioral response: Appropriate  Intervention Topic: Problem Solving    Discussion/Intervention:  Group content on today was focused on problem solving. The group described what problem solving is. Patients expressed how problems affect them and how they deal with problems. Individuals identified healthy ways to deal with problems. Patients explained what normally happens to them when they do not deal with problems. The group expressed reoccurring problems for them. The group participated in the intervention "Put the story together" with their peers and worked together to put a story that was broken up in the correct order.  Clinical Observations/Feedback: Patient came to group and defined problem solving as the process of elimination. He expressed that sometimes worry causes problems. Individual was social with peers and staff while participating in the intervention.  Mataeo Ingwersen LRT/CTRS         Moe Graca 05/13/2020 2:41 PM

## 2020-05-14 DIAGNOSIS — F0391 Unspecified dementia with behavioral disturbance: Secondary | ICD-10-CM | POA: Diagnosis not present

## 2020-05-14 NOTE — Plan of Care (Signed)
Patient stated that he had a good day because he could talk to nursing students today.Appropriate with staff & peers.Compliant with medications.Appetite and energy level good.Support and encouragement given.

## 2020-05-14 NOTE — Progress Notes (Signed)
Recreation Therapy Notes  Date: 05/14/2020  Time: 9:30 am   Location: Craft room  Behavioral response: Appropriate  Intervention Topic: Leisure  Discussion/Intervention:  Group content today was focused on leisure. The group defined what leisure is and some positive leisure activities they participate in. Individuals identified the difference between good and bad leisure. Participants expressed how they feel after participating in the leisure of their choice. The group discussed how they go about picking a leisure activity and if others are involved in their leisure activities. The patient stated how many leisure activities they have to choose from and reasons why it is important to have leisure time. Individuals participated in the intervention "Exploration of Leisure" where they had a chance to identify new leisure activities as well as benefits of leisure.  Clinical Observations/Feedback: Patient came to group and defined leisure as spare time. He stated that he tries to manage his time when participating in leisure so that he does not stay glued to one thing so long. Participant expressed that not having supplies stops him from participating in leisure.  Individual was social with peers and staff while participating in the intervention.  Lorin Gawron LRT/CTRS         Tobin Witucki 05/14/2020 2:08 PM

## 2020-05-14 NOTE — Progress Notes (Signed)
[facility] MD Progress Note  05/14/2020 2:09 PM AZARIUS LAMBSON  MRN:  [id number]  Principal Problem: Dementia [facility]) Diagnosis: Principal Problem:   Dementia ([place]) Active Problems:   Schizoaffective disorder, bipolar type ([place])   Active autistic disorder   Diabetes ([place])   Hypertension   Prostate hypertrophy   Intellectual disability   At risk for elopement  Mr.Eddins is a 64 y.o. male pt that has a previous psychiatric history of Schizoaffective disorder who presents to the [facility] unit for treatment of psychosis.  Interval History Patient was seen today for re-evaluation.  Nursing reports no events overnight. The patient reports no issues with performing ADLs.  Patient has been medication compliant.  The patient reports no side effects from medications.    SUBJECTIVE:  Patient seen one-on-one in the dayroom this morning. He continues to focus on leaving the hospital, and restoring some of the freedoms he used to enjoy. This includes watching his TV programs, going bowling, going to the movies, and visiting with his brother [name]. He also continues to fixate on leaving the hospital and participating in [place] work. He denies suicidal or homicidal thoughts, denies any hallucinations at the time of the interview. He denies side effects from his current medications.No current physical complaints. Today supportive psychotherapy was provided for approximately 40 minutes. Encouraged him to use his deep breathing relaxation techniques today.    Total Time spent with patient: 1 hour  Past Psychiatric History: see H&P   Past Medical History:  Past Medical History:  Diagnosis Date  . Anxiety   . Anxiety disorder due to known physiological condition    HOSPITALIZED 10/18  . Arthritis   . Autistic disorder, residual state   . COPD (chronic obstructive pulmonary disease) ([place])   . Depression   . Developmental disorder   . Diabetes mellitus without complication ([place])   . Dyslipidemia   . Esophageal  reflux   . HOH (hard of hearing)    MILDLY  . Hypertension   . Obesity   . Overactive bladder   . Palpitations    ANXIETY  . Schizophrenia, schizoaffective ([place])   . Sleep apnea   . Tremors of nervous system    HANDS DUE TO MEDICATIONS  . Urinary incontinence     Past Surgical History:  Procedure Laterality Date  . CATARACT EXTRACTION W/PHACO Left 11/14/2017   Procedure: CATARACT EXTRACTION PHACO AND INTRAOCULAR LENS PLACEMENT (IOC);  Surgeon: Birder Robson, MD;  Location: [facility] ORS;  Service: Ophthalmology;  Laterality: Left;  [place] 00:42 AP% 14.6 CDE 6.12 Fluid pack lot # [id number]  . COLONOSCOPY  01/28/2005  . COLONOSCOPY WITH PROPOFOL N/A 05/09/2016   Procedure: COLONOSCOPY WITH PROPOFOL;  Surgeon: Manya Silvas, MD;  Location: [facility] ENDOSCOPY;  Service: Endoscopy;  Laterality: N/A;  . ESOPHAGOGASTRODUODENOSCOPY N/A 05/09/2016   Procedure: ESOPHAGOGASTRODUODENOSCOPY (EGD);  Surgeon: Manya Silvas, MD;  Location: [facility] ENDOSCOPY;  Service: Endoscopy;  Laterality: N/A;  . FRACTURE SURGERY     ORIF SHOULDER  . TONSILLECTOMY     Family History:  Family History  Problem Relation Age of Onset  . Hypertension Mother   . Stroke Father   . Heart Problems Father    Family Psychiatric  History: see H&P Social History   Substance and Sexual Activity  Alcohol Use No  . Alcohol/week: 0.0 standard drinks     Social History   Substance and Sexual Activity  Drug Use No    Social History   Socioeconomic History  . Marital  status: Single    Spouse name: Not on file  . Number of children: Not on file  . Years of education: Not on file  . Highest education level: Not on file  Occupational History  . Not on file  Tobacco Use  . Smoking status: Never Smoker  . Smokeless tobacco: Never Used  Vaping Use  . Vaping Use: Never used  Substance and Sexual Activity  . Alcohol use: No    Alcohol/week: 0.0 standard drinks  . Drug use: No  . Sexual activity: Never  Other  Topics Concern  . Not on file  Social History Narrative   The patient never finished high school but did get his GED. 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 He is currently in disability and [name] is his legal guardian. He has been living in group homes for many years.      No pending legal charges   Social Determinants of Health   Financial Resource Strain:   . Difficulty of Paying Living Expenses: Not on file  Food Insecurity:   . Worried About Charity fundraiser in the Last Year: Not on file  . Ran Out of Food in the Last Year: Not on file  Transportation Needs:   . Lack of Transportation (Medical): Not on file  . Lack of Transportation (Non-Medical): Not on file  Physical Activity:   . Days of Exercise per Week: Not on file  . Minutes of Exercise per Session: Not on file  Stress:   . Feeling of Stress : Not on file  Social Connections:   . Frequency of Communication with Friends and Family: Not on file  . Frequency of Social Gatherings with Friends and Family: Not on file  . Attends Religious Services: Not on file  . Active Member of Clubs or Organizations: Not on file  . Attends Archivist Meetings: Not on file  . Marital Status: Not on file   Additional Social History:                         Sleep: Fair  Appetite:  Fair  Current Medications: Current Facility-Administered Medications  Medication Dose Route Frequency Provider Last Rate Last Admin  . acetaminophen (TYLENOL) tablet 650 mg  650 mg Oral Q6H PRN [name], MD      . alum & mag hydroxide-simeth (MAALOX/MYLANTA) 200-200-20 MG/5ML suspension 30 mL  30 mL Oral Q4H PRN [name], MD      . aspirin EC tablet 81 mg  81 mg Oral Daily [name], [name], MD   81 mg at 05/14/20 0849  . clonazePAM (KLONOPIN) tablet 1 mg  1 mg Oral QID [name], [name], MD   1 mg at 05/14/20 1211  . divalproex (DEPAKOTE ER) 24 hr  tablet 1,500 mg  1,500 mg Oral QHS [name], [name], MD   1,500 mg at 05/13/20 2043  . gabapentin (NEURONTIN) capsule 100 mg  100 mg Oral TID [name], [name], MD   100 mg at 05/14/20 1211  . hydrOXYzine (ATARAX/VISTARIL) tablet 10 mg  10 mg Oral TID PRN [name], MD   10 mg at 05/13/20 1519  . influenza vac split quadrivalent PF (FLUARIX) injection 0.5 mL  0.5 mL Intramuscular Tomorrow-1000 [name], [name], MD      . lamoTRIgine (LAMICTAL) tablet 100 mg  100 mg Oral QHS [name],  [name], MD   100 mg at 05/13/20 2043  . linagliptin (TRADJENTA) tablet 5 mg  5 mg Oral Daily [name], [name], MD   5 mg at 05/14/20 0849  . loperamide (IMODIUM) capsule 2 mg  2 mg Oral Q4H PRN [name], MD   2 mg at 04/10/20 0917  . loratadine (CLARITIN) tablet 10 mg  10 mg Oral Daily [name], [name], MD   10 mg at 05/14/20 0850  . LORazepam (ATIVAN) tablet 2 mg  2 mg Oral Q6H PRN [name], [name], MD   2 mg at 05/02/20 1211  . magnesium hydroxide (MILK OF MAGNESIA) suspension 30 mL  30 mL Oral Daily PRN [name], MD   30 mL at 03/23/20 1344  . metFORMIN (GLUCOPHAGE) tablet 850 mg  850 mg Oral BID WC [name], [name], MD   850 mg at 05/14/20 0849  . metoprolol succinate (TOPROL-XL) 24 hr tablet 50 mg  50 mg Oral Daily [name], [name], MD   50 mg at 05/14/20 0850  . ondansetron (ZOFRAN) tablet 4 mg  4 mg Oral Q8H PRN [name], [name], MD   4 mg at 04/30/20 2035  . oxybutynin (DITROPAN) tablet 10 mg  10 mg Oral BID [name], [name], MD   10 mg at 05/14/20 0849  . pantoprazole (PROTONIX) EC tablet 40 mg  40 mg Oral Daily [name], [name], MD   40 mg at 05/14/20 0850  . prazosin (MINIPRESS) capsule 1 mg  1 mg Oral QHS [name], [name], MD   1 mg at 05/13/20 2042  . QUEtiapine (SEROQUEL) tablet 100 mg  100 mg Oral QID [name], [name], MD   100 mg at 05/14/20 1211  . QUEtiapine (SEROQUEL) tablet 400 mg  400 mg Oral QHS [name], NP   400 mg at 05/13/20 2042  . sertraline (ZOLOFT) tablet 50 mg  50 mg Oral  BID [name], [name], MD   50 mg at 05/14/20 0849  . simethicone (MYLICON) chewable tablet 80 mg  80 mg Oral TID [name], [name], MD   80 mg at 05/14/20 1211  . simvastatin (ZOCOR) tablet 20 mg  20 mg Oral q1800 [name], [name], MD   20 mg at 05/13/20 1704  . tamsulosin (FLOMAX) capsule 0.8 mg  0.8 mg Oral QPC supper [name], [name], MD   0.8 mg at 05/13/20 1706    Lab Results:  No results found for this or any previous visit (from the past 48 hour(s)).  Blood Alcohol level:  Lab Results  Component Value Date   ETH <10 01/22/2020   ETH <10 04/26/5101    Metabolic Disorder Labs: Lab Results  Component Value Date   HGBA1C 6.6 (H) 02/20/2020   MPG 142.72 02/20/2020   No results found for: PROLACTIN Lab Results  Component Value Date   CHOL 153 04/10/2015   TRIG 166 (H) 04/10/2015   HDL 50 04/10/2015   CHOLHDL 3.1 04/10/2015   VLDL 33 04/10/2015   LDLCALC 70 04/10/2015    Physical Findings: AIMS: Facial and Oral Movements Muscles of Facial Expression: None, normal Lips and Perioral Area: None, normal Jaw: None, normal Tongue: None, normal,Extremity Movements Upper (arms, wrists, hands, fingers): None, normal Lower (legs, knees, ankles, toes): None, normal, Trunk Movements Neck, shoulders, hips: None, normal, Overall Severity Severity of abnormal movements (highest score from questions above): None, normal Incapacitation due to abnormal movements: None, normal Patient's awareness of abnormal movements (rate only patient's report):  No Awareness, Dental Status Current problems with teeth and/or dentures?: No Does patient usually wear dentures?: No  CIWA:    COWS:     Musculoskeletal: Strength & Muscle Tone: within normal limits Gait & Station: normal Patient leans: N/A  Psychiatric Specialty Exam: Physical Exam Vitals and nursing note reviewed.  Constitutional:      Appearance: Normal appearance.  HENT:     Head: Normocephalic and atraumatic.     Right Ear:  External ear normal.     Left Ear: External ear normal.     Nose: Nose normal.     Mouth/Throat:     Mouth: Mucous membranes are moist.     Pharynx: Oropharynx is clear.  Eyes:     Extraocular Movements: Extraocular movements intact.     Conjunctiva/sclera: Conjunctivae normal.     Pupils: Pupils are equal, round, and reactive to light.  Cardiovascular:     Rate and Rhythm: Normal rate.     Pulses: Normal pulses.  Pulmonary:     Effort: Pulmonary effort is normal.     Breath sounds: Normal breath sounds.  Abdominal:     General: Abdomen is flat.     Palpations: Abdomen is soft.  Musculoskeletal:        General: No swelling. Normal range of motion.     Cervical back: Normal range of motion and neck supple.  Skin:    General: Skin is warm and dry.  Neurological:     General: No focal deficit present.     Mental Status: He is alert and oriented to person, place, and time.  Psychiatric:        Attention and Perception: Attention and perception normal.        Mood and Affect: Mood is anxious. Affect is not tearful.        Speech: Speech normal.        Behavior: Behavior is cooperative.        Thought Content: Thought content normal.        Cognition and Memory: Cognition and memory normal.        Judgment: Judgment normal.     Review of Systems  Constitutional: Negative for activity change and appetite change.  HENT: Negative for rhinorrhea and sore throat.   Eyes: Negative for photophobia and visual disturbance.  Respiratory: Negative for cough and shortness of breath.   Cardiovascular: Negative for chest pain and palpitations.  Gastrointestinal: Negative for constipation, diarrhea, nausea and vomiting.  Endocrine: Negative for cold intolerance and heat intolerance.  Genitourinary: Negative for difficulty urinating and dysuria.  Musculoskeletal: Negative for arthralgias and myalgias.  Skin: Negative for rash and wound.  Allergic/Immunologic: Negative for food allergies and  immunocompromised state.  Neurological: Negative for dizziness and headaches.  Hematological: Negative for adenopathy. Does not bruise/bleed easily.  Psychiatric/Behavioral: Negative for behavioral problems, hallucinations and suicidal ideas.    Blood pressure 120/76, pulse 78, temperature 98 F (36.7 C), temperature source Oral, resp. rate 18, height 5\' 8"  (1.727 m), weight 104.3 kg, SpO2 95 %.Body mass index is 34.96 kg/m.  General Appearance: Disheveled  Eye Contact:  Fair  Speech:  Normal  Volume:  Normal  Mood:  Anxious  Affect:  Congruent  Thought Process:  Coherent  Orientation:  Full (Time, Place, and Person)  Thought Content:  Logical  Suicidal Thoughts:  No  Homicidal Thoughts:  No  Memory:  Immediate;   Fair Recent;   Fair Remote;   Fair  Judgement:  Impaired  Insight:  Shallow  Psychomotor Activity: wnl  Concentration:  Concentration: Fair and Attention Span: Fair  Recall:  [employer] of Knowledge:  Fair  Language:  Fair  Akathisia:  No  Handed:  Right  AIMS (if indicated):     Assets:  Communication Skills Desire for Improvement Resilience Social Support  ADL's:  Intact  Cognition:  Impaired,  Mild  Sleep:  Number of Hours: 6.75     Treatment Plan Summary: Daily contact with patient to assess and evaluate symptoms and progress in treatment and Medication management   Patient is a 64 year old male with the above-stated past psychiatric history who is seen in follow-up.  Chart reviewed. Patient discussed with nursing. Patient appears stable, likely at his mental baseline. He is awaiting placement. Referrals sent by SW.    Plan:  -continue inpatient psych admission; 15-minute checks; daily contact with patient to assess and evaluate symptoms and progress in treatment; psychoeducation.  -continue scheduled medications without changes today. Marland Kitchen aspirin EC  81 mg Oral Daily  . clonazePAM  1 mg Oral QID  . divalproex  1,500 mg Oral QHS  . gabapentin  100  mg Oral TID  . influenza vac split quadrivalent PF  0.5 mL Intramuscular Tomorrow-1000  . lamoTRIgine  100 mg Oral QHS  . linagliptin  5 mg Oral Daily  . loratadine  10 mg Oral Daily  . metFORMIN  850 mg Oral BID WC  . metoprolol succinate  50 mg Oral Daily  . oxybutynin  10 mg Oral BID  . pantoprazole  40 mg Oral Daily  . prazosin  1 mg Oral QHS  . QUEtiapine  100 mg Oral QID  . QUEtiapine  400 mg Oral QHS  . sertraline  50 mg Oral BID  . simethicone  80 mg Oral TID  . simvastatin  20 mg Oral q1800  . tamsulosin  0.8 mg Oral QPC supper   -continue PRN medications. acetaminophen, alum & mag hydroxide-simeth, hydrOXYzine, loperamide, LORazepam, magnesium hydroxide, ondansetron  -Disposition: to be determined.  Continuing to search for placement.  Salley Scarlet, MD 05/14/2020, 2:09 PM

## 2020-05-14 NOTE — Progress Notes (Signed)
Patient pleasant and cooperative. Continues to complain of gas and request provider give medication. Denies any SI, HI, AVH. In dayroom early part of shift. Minimal interaction with peers. Resting well, eyes closed in no distress. Encouragement and support provided. Safety checks maintained. Medications given as prescribed. Pt receptive and remains safe on unit q 15 min checks.

## 2020-05-14 NOTE — BHH Group Notes (Signed)
LCSW Group Therapy Note  05/14/2020 2:24 PM  Type of Therapy/Topic:  Group Therapy:  Balance in Life  Participation Level:  Active  Description of Group:    This group will address the concept of balance and how it feels and looks when one is unbalanced. Patients will be encouraged to process areas in their lives that are out of balance and identify reasons for remaining unbalanced. Facilitators will guide patients in utilizing problem-solving interventions to address and correct the stressor making their life unbalanced. Understanding and applying boundaries will be explored and addressed for obtaining and maintaining a balanced life. Patients will be encouraged to explore ways to assertively make their unbalanced needs known to significant others in their lives, using other group members and facilitator for support and feedback.  Therapeutic Goals: 1. Patient will identify two or more emotions or situations they have that consume much of in their lives. 2. Patient will identify signs/triggers that life has become out of balance:  3. Patient will identify two ways to set boundaries in order to achieve balance in their lives:  4. Patient will demonstrate ability to communicate their needs through discussion and/or role plays  Summary of Patient Progress: Pt was present in the group and participated. His comments were appropriate although focused on "not having his freedom". However, he was easily redirectable.  Therapeutic Modalities:   Cognitive Behavioral Therapy Solution-Focused Therapy Assertiveness Training  [employer]. [name], MSW, [place], [place] 05/14/2020 2:24 PM

## 2020-05-14 NOTE — Plan of Care (Signed)
  Problem: Education: Goal: Emotional status will improve Outcome: Progressing Goal: Mental status will improve Outcome: Progressing   Problem: Activity: Goal: Sleeping patterns will improve Outcome: Progressing   Problem: Education: Goal: Emotional status will improve Outcome: Progressing Goal: Mental status will improve Outcome: Progressing

## 2020-05-14 NOTE — BHH Counselor (Signed)
CSW called Alroy Dust at [phone].  He reports concerns of pt's hisotry of elopement from his previous homes.  He reports that per the brother the patient has run from every home that he has been placed.  He reports plans to follow up with previous home "before we can place him".  He asks that we follow up tomorrow.  CSW called Jasmine at [facility] who declines patient at this time due to "not being a good fit for our community and his history of past behaviors".    Assunta Curtis, MSW, LCSW 05/14/2020 3:32 PM

## 2020-05-14 NOTE — Tx Team (Signed)
Interdisciplinary Treatment and Diagnostic Plan Update  05/14/2020 Time of Session: 8:30 AM  Alexander Beard MRN: [id number]  Principal Diagnosis: Dementia [facility])  Secondary Diagnoses: Principal Problem:   Dementia ([place]) Active Problems:   Schizoaffective disorder, bipolar type ([place])   Active autistic disorder   Diabetes ([place])   Hypertension   Prostate hypertrophy   Intellectual disability   At risk for elopement   Current Medications:  Current Facility-Administered Medications  Medication Dose Route Frequency Provider Last Rate Last Admin  . acetaminophen (TYLENOL) tablet 650 mg  650 mg Oral Q6H PRN [name], MD      . alum & mag hydroxide-simeth (MAALOX/MYLANTA) 200-200-20 MG/5ML suspension 30 mL  30 mL Oral Q4H PRN [name], MD      . aspirin EC tablet 81 mg  81 mg Oral Daily [name], [name], MD   81 mg at 05/14/20 0849  . clonazePAM (KLONOPIN) tablet 1 mg  1 mg Oral QID [name], [name], MD   1 mg at 05/14/20 0849  . divalproex (DEPAKOTE ER) 24 hr tablet 1,500 mg  1,500 mg Oral QHS [name], [name], MD   1,500 mg at 05/13/20 2043  . gabapentin (NEURONTIN) capsule 100 mg  100 mg Oral TID [name], [name], MD   100 mg at 05/14/20 0849  . hydrOXYzine (ATARAX/VISTARIL) tablet 10 mg  10 mg Oral TID PRN [name], MD   10 mg at 05/13/20 1519  . influenza vac split quadrivalent PF (FLUARIX) injection 0.5 mL  0.5 mL Intramuscular Tomorrow-1000 [name], [name], MD      . lamoTRIgine (LAMICTAL) tablet 100 mg  100 mg Oral QHS [name], [name], MD   100 mg at 05/13/20 2043  . linagliptin (TRADJENTA) tablet 5 mg  5 mg Oral Daily [name], [name], MD   5 mg at 05/14/20 0849  . loperamide (IMODIUM) capsule 2 mg  2 mg Oral Q4H PRN [name], MD   2 mg at 04/10/20 0917  . loratadine (CLARITIN) tablet 10 mg  10 mg Oral Daily [name], [name], MD   10 mg at 05/14/20 0850  . LORazepam (ATIVAN) tablet 2 mg  2 mg Oral Q6H PRN [name], [name], MD   2 mg at 05/02/20 1211  . magnesium  hydroxide (MILK OF MAGNESIA) suspension 30 mL  30 mL Oral Daily PRN [name], MD   30 mL at 03/23/20 1344  . metFORMIN (GLUCOPHAGE) tablet 850 mg  850 mg Oral BID WC [name], [name], MD   850 mg at 05/14/20 0849  . metoprolol succinate (TOPROL-XL) 24 hr tablet 50 mg  50 mg Oral Daily [name], [name], MD   50 mg at 05/14/20 0850  . ondansetron (ZOFRAN) tablet 4 mg  4 mg Oral Q8H PRN [name], [name], MD   4 mg at 04/30/20 2035  . oxybutynin (DITROPAN) tablet 10 mg  10 mg Oral BID [name], [name], MD   10 mg at 05/14/20 0849  . pantoprazole (PROTONIX) EC tablet 40 mg  40 mg Oral Daily [name], [name], MD   40 mg at 05/14/20 0850  . prazosin (MINIPRESS) capsule 1 mg  1 mg Oral QHS [name], [name], MD   1 mg at 05/13/20 2042  . QUEtiapine (SEROQUEL) tablet 100 mg  100 mg Oral QID [name], [name], MD   100 mg at 05/14/20 0850  . QUEtiapine (SEROQUEL) tablet 400 mg  400 mg Oral QHS [name], NP  400 mg at 05/13/20 2042  . sertraline (ZOLOFT) tablet 50 mg  50 mg Oral BID [name], [name], MD   50 mg at 05/14/20 0849  . simethicone (MYLICON) chewable tablet 80 mg  80 mg Oral TID [name], [name], MD   80 mg at 05/14/20 0849  . simvastatin (ZOCOR) tablet 20 mg  20 mg Oral q1800 [name], [name], MD   20 mg at 05/13/20 1704  . tamsulosin (FLOMAX) capsule 0.8 mg  0.8 mg Oral QPC supper [name], [name], MD   0.8 mg at 05/13/20 1706   PTA Medications: Medications Prior to Admission  Medication Sig Dispense Refill Last Dose  . acetaminophen (TYLENOL) 650 MG CR tablet Take 650 mg by mouth every 12 (twelve) hours as needed for pain.     Marland Kitchen aspirin EC 81 MG tablet Take 81 mg by mouth daily.     . Calcium Carbonate-Vitamin D3 (CALCIUM 600-D) 600-400 MG-UNIT TABS Take 1 tablet by mouth 2 (two) times daily.     . Cholecalciferol (VITAMIN D-1000 MAX ST) 1000 UNITS tablet Take 1,000 Units by mouth daily.      Marland Kitchen docusate sodium (COLACE) 100 MG capsule Take 100 mg by mouth daily.      . furosemide  (LASIX) 20 MG tablet Take 20 mg by mouth daily.      Marland Kitchen gabapentin (NEURONTIN) 100 MG capsule Take 1 capsule (100 mg total) by mouth 3 (three) times daily. 90 capsule 10   . Insulin Detemir (LEVEMIR FLEXTOUCH) 100 UNIT/ML Pen Inject 15 Units into the skin daily at 10 pm.     . lamoTRIgine (LAMICTAL) 100 MG tablet Take 1 tablet (100 mg total) by mouth at bedtime. 30 tablet 10   . loratadine (CLARITIN) 10 MG tablet Take 1 tablet (10 mg total) by mouth daily. 30 tablet 5   . LORazepam (ATIVAN) 1 MG tablet Take 1 tablet (1 mg total) by mouth 3 (three) times daily. 90 tablet 5   . lurasidone (LATUDA) 40 MG TABS tablet Take 1 tablet (40 mg total) by mouth daily. with food 30 tablet 10   . metoprolol succinate (TOPROL-XL) 50 MG 24 hr tablet Take 50 mg by mouth daily.      . NON FORMULARY cpap device     . oxybutynin (DITROPAN) 5 MG tablet Take 5 mg by mouth daily.     . pantoprazole (PROTONIX) 40 MG tablet Take 40 mg by mouth daily.     . polyethylene glycol (MIRALAX / GLYCOLAX) packet Take 17 g by mouth daily.     . QUEtiapine (SEROQUEL) 100 MG tablet TAKE 1 TABLET  BY MOUTH THREE TIMES A DAY (Patient taking differently: Take 100 mg by mouth 3 (three) times daily. TAKE 1 TABLET  BY MOUTH THREE TIMES A DAY) 90 tablet 11   . QUEtiapine (SEROQUEL) 400 MG tablet Take 1 tablet (400 mg total) by mouth at bedtime. 30 tablet 10   . sertraline (ZOLOFT) 100 MG tablet Take 2 tablets (200 mg total) by mouth daily. (Patient taking differently: Take 200 mg by mouth daily. Take along with two 50 mg tablets (100 mg) for total 300 mg daily) 60 tablet 5   . sertraline (ZOLOFT) 50 MG tablet Take 2 tablets (100 mg total) by mouth daily. (Patient taking differently: Take 100 mg by mouth daily. Take along with two 100 mg tablets (200 mg) for total 300 mg daily) 60 tablet 5   . simethicone (MYLICON) 80 MG chewable tablet  Chew 80-160 mg by mouth 2 (two) times daily as needed for flatulence.     . simvastatin (ZOCOR) 20 MG tablet  Take 20 mg by mouth at bedtime.      . sitaGLIPtin (JANUVIA) 100 MG tablet Take 100 mg by mouth daily.     . solifenacin (VESICARE) 5 MG tablet Take 5 mg by mouth daily.     . Starch (HEMORRHOIDAL RE) Place 1 application rectally 4 (four) times daily as needed (for itching).     . tamsulosin (FLOMAX) 0.4 MG CAPS capsule Take 0.4 mg by mouth daily.       Patient Stressors: Health problems Marital or family conflict Medication change or noncompliance Traumatic event  Patient Strengths: Motivation for treatment/growth Religious Affiliation  Treatment Modalities: Medication Management, Group therapy, Case management,  1 to 1 session with clinician, Psychoeducation, Recreational therapy.   Physician Treatment Plan for Primary Diagnosis: Dementia [facility]) Long Term Goal(s): Improvement in symptoms so as ready for discharge Improvement in symptoms so as ready for discharge   Short Term Goals: Ability to verbalize feelings will improve Ability to demonstrate self-control will improve Ability to maintain clinical measurements within normal limits will improve Compliance with prescribed medications will improve  Medication Management: Evaluate patient's response, side effects, and tolerance of medication regimen.  Therapeutic Interventions: 1 to 1 sessions, Unit Group sessions and Medication administration.  Evaluation of Outcomes: Progressing  Physician Treatment Plan for Secondary Diagnosis: Principal Problem:   Dementia ([place]) Active Problems:   Schizoaffective disorder, bipolar type ([place])   Active autistic disorder   Diabetes ([place])   Hypertension   Prostate hypertrophy   Intellectual disability   At risk for elopement  Long Term Goal(s): Improvement in symptoms so as ready for discharge Improvement in symptoms so as ready for discharge   Short Term Goals: Ability to verbalize feelings will improve Ability to demonstrate self-control will improve Ability to maintain clinical  measurements within normal limits will improve Compliance with prescribed medications will improve     Medication Management: Evaluate patient's response, side effects, and tolerance of medication regimen.  Therapeutic Interventions: 1 to 1 sessions, Unit Group sessions and Medication administration.  Evaluation of Outcomes: Progressing   RN Treatment Plan for Primary Diagnosis: Dementia ([place]) Long Term Goal(s): Knowledge of disease and therapeutic regimen to maintain health will improve  Short Term Goals: Ability to demonstrate self-control, Ability to participate in decision making will improve, Ability to verbalize feelings will improve, Ability to identify and develop effective coping behaviors will improve and Compliance with prescribed medications will improve  Medication Management: RN will administer medications as ordered by provider, will assess and evaluate patient's response and provide education to patient for prescribed medication. RN will report any adverse and/or side effects to prescribing provider.  Therapeutic Interventions: 1 on 1 counseling sessions, Psychoeducation, Medication administration, Evaluate responses to treatment, Monitor vital signs and CBGs as ordered, Perform/monitor CIWA, COWS, AIMS and Fall Risk screenings as ordered, Perform wound care treatments as ordered.  Evaluation of Outcomes: Progressing   LCSW Treatment Plan for Primary Diagnosis: Dementia ([place]) Long Term Goal(s): Safe transition to appropriate next level of care at discharge, Engage patient in therapeutic group addressing interpersonal concerns.  Short Term Goals: Engage patient in aftercare planning with referrals and resources, Increase social support, Increase ability to appropriately verbalize feelings, Increase emotional regulation and Increase skills for wellness and recovery  Therapeutic Interventions: Assess for all discharge needs, 1 to 1 time with Social worker, Explore available  resources and support systems, Assess for adequacy in community support network, Educate family and significant other(s) on suicide prevention, Complete Psychosocial Assessment, Interpersonal group therapy.  Evaluation of Outcomes: Progressing   Progress in Treatment: Attending groups: Yes. Participating in groups: Yes. Taking medication as prescribed: Yes. Toleration medication: Yes. Family/Significant other contact made: Yes, individual(s) contacted:  V[place]ery [place], brother  Patient understands diagnosis: Yes. Discussing patient identified problems/goals with staff: Yes. Medical problems stabilized or resolved: Yes. Denies suicidal/homicidal ideation: Yes. Issues/concerns per patient self-inventory: No. Other: N/A  New problem(s) identified: No, Describe:  None  New Short Term/Long Term Goal(s): Elimination of symptoms of psychosis, medication management for mood stabilization; elimination of SI thoughts; development of comprehensive mental wellness/sobriety plan.Update 03/01/20:No changes at this time. 03/07/20: Update, no changes at this time Update 03/12/2020: No changes at this time.Update 03/17/2020:No changes at this time. Update 03/21/20:No changes at this time. Update 03/26/20: No changes at this time. Update 03/31/2020: No changes at this time.Update 04/04/20: No changes at this time Update 04/09/2020: No changes at this time. Update 04/14/2020: No changes at this time. Update 04/20/2020:No changes at this time. Update: 04/25/2020: No changes at this time. Update 04/30/2020:  No changes at this time. Update 05/05/2020: No changes at this time. Update 05/09/20: No changes at this time. Update 05/14/20: No changes at this time.  Patient Goals:  Patient stated he would like to go back to [place] and reconnect with his peer support. Patient also stated that he would like to increase his social support and help his brother.Update 03/07/20,no change Update 03/12/2020: No changes at this  time. Update 03/17/2020:No changes at this time.Update 03/21/20: No changes at this time. Update 03/26/20: No changes at this time. Update 03/31/2020: No changes at this time. Update9/25/21:No changes at this time. Update 04/09/2020: No changes at this time. Update 04/14/2020: No changes at this time. Update 04/20/2020:No changes at this time. 04/25/2020: Patient still indicates that he would like to go back to his old ways and life. Patient would like to get back into the community. 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 Update 04/30/2020:  No changes at this time. Update 05/05/2020: No changes at this time. Patient maintains that he wants to get back to his old way of life, volunteer in the community, do his artwork, and help his brother. Update 05/09/20: No changes at this time. Update 05/14/20: No changes at this time.    Discharge Plan or Barriers: Patient has an interview with a group home on 03/09/20 at 2:00 PM. Update 03/12/2020: CSW continues to assist the patient in developing a housing plan.Update 03/17/2020:CSW has assisted patient in completing an interview with a group home, however, there has been no word on if the patient has been approved. Patient continues to struggle with his anxiety and nerves. Nurses report that he has increased in bed wetting. Psychiatrist and nurses believe that this may be related to his increased anxiety about finding a home.Update 03/21/20: Patient is still nervous and anxious about finding a new placement. Patient stated he could not stay here too much longer and wanted his life back. Update 03/26/20: CSW continues to look for placement for pt. Group Homes, [place] are being considered. Update 03/31/2020: Patient continues to have interview with group homes, however, no success at this time in identifying a home that has accepted him.Update: 04/04/20:No changes at this time. Update 04/09/2020: CSW continues to  look for placement for patient. Update 04/14/2020:  No changes at this time. CSW continues to look for placement. Update 04/20/2020:CSW continues to assist with placement needs. 04/25/2020: CSW continues to look for placement options. Update 04/30/2020:  CSW continues to look for placement for the patient.  Patient has been declined for 17 SNF's however, there are a quite a few where the referral is pending.  CSW has contacted several group homes, however, none have selected the patient. Update 05/05/2020: CSW continues to look for placement for the patient. Patient has been declined at 69 SNF's with quite a few referrals left pending. CSW also sent out referrals for memory care facilities. CSW continues to contact groups homes without any selecting the patient at the moment. Update 05/09/20: CSW will continue to find appropriate placement for patient. Update 05/14/20: CSW will continue to find appropriate placement for patient.    Reason for Continuation of Hospitalization: Depression Medication stabilization  Estimated Length of Stay: TBD   Attendees: Patient: 05/14/2020 10:39 AM  Physician: Selina Cooley, MD 05/14/2020 10:39 AM  Nursing:  05/14/2020 10:39 AM  RN Care Manager: 05/14/2020 10:39 AM  Social Worker: Chalmers Guest. [name], MSW, LCSW, [place] 05/14/2020 10:39 AM  Recreational Therapist:  05/14/2020 10:39 AM  Other: Assunta Curtis, MSW, LCSW 05/14/2020 10:39 AM  Other:  05/14/2020 10:39 AM  Other: 05/14/2020 10:39 AM    Scribe for Treatment Team: Shirl Harris, LCSW 05/14/2020 10:39 AM

## 2020-05-14 NOTE — BHH Group Notes (Signed)
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  Modes of Intervention:  Support  Summary of Progress/Problems:  Alexander Beard 05/14/2020, 10:25 PM

## 2020-05-15 DIAGNOSIS — F0391 Unspecified dementia with behavioral disturbance: Secondary | ICD-10-CM | POA: Diagnosis not present

## 2020-05-15 NOTE — Progress Notes (Addendum)
Pt denies depression, anxiety, SI, HI and AVH. Pt was educated on care plan and verbalizes understanding. Collier Bullock RN

## 2020-05-15 NOTE — Progress Notes (Signed)
Patient denies SI, HI and AVH. Voices no complaints

## 2020-05-15 NOTE — Plan of Care (Signed)
Problem: Education: Goal: Knowledge of [place] [facility] Education information/materials will improve Outcome: Progressing Goal: Emotional status will improve Outcome: Progressing Goal: Mental status will improve Outcome: Progressing Goal: Verbalization of understanding the information provided will improve Outcome: Progressing   Problem: Activity: Goal: Interest or engagement in activities will improve Outcome: Progressing Goal: Sleeping patterns will improve Outcome: Progressing   Problem: Coping: Goal: Ability to verbalize frustrations and anger appropriately will improve Outcome: Progressing Goal: Ability to demonstrate self-control will improve Outcome: Progressing   Problem: Health Behavior/Discharge Planning: Goal: Identification of resources available to assist in meeting health care needs will improve Outcome: Progressing Goal: Compliance with treatment plan for underlying cause of condition will improve Outcome: Progressing   Problem: Physical Regulation: Goal: Ability to maintain clinical measurements within normal limits will improve Outcome: Progressing   Problem: Safety: Goal: Periods of time without injury will increase Outcome: Progressing   Problem: Education: Goal: Ability to make informed decisions regarding treatment will improve Outcome: Progressing   Problem: Coping: Goal: Coping ability will improve Outcome: Progressing   Problem: Health Behavior/Discharge Planning: Goal: Identification of resources available to assist in meeting health care needs will improve Outcome: Progressing   Problem: Medication: Goal: Compliance with prescribed medication regimen will improve Outcome: Progressing   Problem: Self-Concept: Goal: Ability to disclose and discuss suicidal ideas will improve Outcome: Progressing Goal: Will verbalize positive feelings about self Outcome: Progressing   Problem: Education: Goal: Knowledge of [place] [facility]  Education information/materials will improve Outcome: Progressing Goal: Emotional status will improve Outcome: Progressing Goal: Mental status will improve Outcome: Progressing Goal: Verbalization of understanding the information provided will improve Outcome: Progressing   Problem: Activity: Goal: Interest or engagement in activities will improve Outcome: Progressing Goal: Sleeping patterns will improve Outcome: Progressing   Problem: Coping: Goal: Ability to verbalize frustrations and anger appropriately will improve Outcome: Progressing Goal: Ability to demonstrate self-control will improve Outcome: Progressing   Problem: Health Behavior/Discharge Planning: Goal: Identification of resources available to assist in meeting health care needs will improve Outcome: Progressing Goal: Compliance with treatment plan for underlying cause of condition will improve Outcome: Progressing   Problem: Physical Regulation: Goal: Ability to maintain clinical measurements within normal limits will improve Outcome: Progressing   Problem: Safety: Goal: Periods of time without injury will increase Outcome: Progressing   Problem: Education: Goal: Knowledge of [place] [facility] Education information/materials will improve Outcome: Progressing Goal: Emotional status will improve Outcome: Progressing Goal: Mental status will improve Outcome: Progressing Goal: Verbalization of understanding the information provided will improve Outcome: Progressing   Problem: Activity: Goal: Interest or engagement in activities will improve Outcome: Progressing Goal: Sleeping patterns will improve Outcome: Progressing   Problem: Coping: Goal: Ability to verbalize frustrations and anger appropriately will improve Outcome: Progressing Goal: Ability to demonstrate self-control will improve Outcome: Progressing   Problem: Health Behavior/Discharge Planning: Goal: Identification of resources available  to assist in meeting health care needs will improve Outcome: Progressing Goal: Compliance with treatment plan for underlying cause of condition will improve Outcome: Progressing   Problem: Physical Regulation: Goal: Ability to maintain clinical measurements within normal limits will improve Outcome: Progressing   Problem: Safety: Goal: Periods of time without injury will increase Outcome: Progressing   Problem: Education: Goal: Knowledge of [place] [facility] Education information/materials will improve Outcome: Progressing Goal: Emotional status will improve Outcome: Progressing Goal: Mental status will improve Outcome: Progressing Goal: Verbalization of understanding the information provided will improve Outcome: Progressing   Problem: Activity: Goal: Interest  or engagement in activities will improve Outcome: Progressing Goal: Sleeping patterns will improve Outcome: Progressing   Problem: Coping: Goal: Ability to verbalize frustrations and anger appropriately will improve Outcome: Progressing Goal: Ability to demonstrate self-control will improve Outcome: Progressing   Problem: Health Behavior/Discharge Planning: Goal: Identification of resources available to assist in meeting health care needs will improve Outcome: Progressing Goal: Compliance with treatment plan for underlying cause of condition will improve Outcome: Progressing   Problem: Physical Regulation: Goal: Ability to maintain clinical measurements within normal limits will improve Outcome: Progressing   Problem: Safety: Goal: Periods of time without injury will increase Outcome: Progressing

## 2020-05-15 NOTE — Progress Notes (Signed)
D- Patient alert and oriented. Pt affect/mood has been pleasant, calm and cooperative. Pt denies SI, HI, AVH, and pain. Pt has been social and attended groups.   A- Scheduled medications administered to patient, per MD orders. Support and encouragement provided.  Routine safety checks conducted every 15 minutes.  Patient informed to notify staff with problems or concerns.  R- No adverse drug reactions noted. Patient contracts for safety at this time. Patient compliant with medications and treatment plan. Patient receptive, calm, and cooperative. Patient interacts well with others on the unit.  Patient remains safe at this time.  Collier Bullock RN

## 2020-05-15 NOTE — BHH Counselor (Signed)
CSW called [facility] [phone] to follow up on placement.  He asked CSW to return call in 10-15 minutes.  When CSW returned the call CSW left HIPAA compliant voicemail.  Assunta Curtis, MSW, LCSW 05/15/2020 4:15 PM

## 2020-05-15 NOTE — BHH Counselor (Signed)
CSW spoke with Keppy at [facility].  He reports that he is still on the fence with the patient and wants to think over the referral a bit longer.  Assunta Curtis, MSW, LCSW 05/15/2020 4:09 PM

## 2020-05-15 NOTE — [facility] Group Notes (Signed)
LCSW Group Therapy Note  05/15/2020 2:38 PM  Type of Therapy/Topic:  Group Therapy:  Emotion Regulation  Participation Level:  Minimal   Description of Group:   The purpose of this group is to assist patients in learning to regulate negative emotions and experience positive emotions. Patients will be guided to discuss ways in which they have been vulnerable to their negative emotions. These vulnerabilities will be juxtaposed with experiences of positive emotions or situations, and patients will be challenged to use positive emotions to combat negative ones. Special emphasis will be placed on coping with negative emotions in conflict situations, and patients will process healthy conflict resolution skills.  Therapeutic Goals: 1. Patient will identify two positive emotions or experiences to reflect on in order to balance out negative emotions 2. Patient will label two or more emotions that they find the most difficult to experience 3. Patient will demonstrate positive conflict resolution skills through discussion and/or role plays  Summary of Patient Progress: Patient was present in group. Patient was supportive of others in group.  Patient shared that he uses the coping skill of resting and artwork.   Therapeutic Modalities:   Cognitive Behavioral Therapy Feelings Identification Dialectical Behavioral Therapy  Assunta Curtis, MSW, LCSW 05/15/2020 2:38 PM

## 2020-05-15 NOTE — Progress Notes (Signed)
Cooperative with treatment. Medication compliant. No issues to report on shift at this time.

## 2020-05-15 NOTE — Progress Notes (Signed)
Recreation Therapy Notes   Date: 05/15/2020  Time: 9:30 am   Location: Craft room  Behavioral response: Appropriate  Intervention Topic: Emotions   Discussion/Intervention:  Group content on today was focused on emotions. The group identified what emotions are and why it is important to have emotions. Patients expressed some positive and negative emotions. Individuals gave some past experiences on how they normally dealt with emotions in the past. The group described some positive ways to deal with emotions in the future. Patients participated in the intervention "Name the Alexander Beard" where individuals were given a chance to experience different emotions.   Clinical Observations/Feedback: Patient came to group and defined emotions as caring about others. He expressed that it is important to exercise self-control to manage emotions properly. Individual was social with peers and staff while participating in the intervention.  Alexander Beard LRT/CTRS         [name] 05/15/2020 12:22 PM

## 2020-05-15 NOTE — Progress Notes (Signed)
[facility] MD Progress Note  05/15/2020 11:31 AM Alexander Beard  MRN:  [id number]  Principal Problem: Dementia [facility]) Diagnosis: Principal Problem:   Dementia ([place]) Active Problems:   Schizoaffective disorder, bipolar type ([place])   Active autistic disorder   Diabetes ([place])   Hypertension   Prostate hypertrophy   Intellectual disability   At risk for elopement  Alexander Beard is a 64 y.o. male pt that has a previous psychiatric history of Schizoaffective disorder who presents to the [facility] unit for treatment of psychosis.  Interval History Patient was seen today for re-evaluation.  Nursing reports no events overnight. The patient reports no issues with performing ADLs.  Patient has been medication compliant.  The patient reports no side effects from medications.    SUBJECTIVE:  Patient seen one-on-one in office this morning. He continues to focus on leaving the hospital, and restoring some of the freedoms he used to enjoy. This includes watching his TV programs, going bowling, going to the movies, and visiting with his brother, Alexander Beard. He also continues to fixate on leaving the hospital and participating in [employer] work. He denies suicidal or homicidal thoughts, denies any hallucinations at the time of the interview. He denies side effects from his current medications.No current physical complaints. . Encouraged him to use his deep breathing relaxation techniques today.    Total Time spent with patient: 30 minutes  Past Psychiatric History: see H&P   Past Medical History:  Past Medical History:  Diagnosis Date  . Anxiety   . Anxiety disorder due to known physiological condition    HOSPITALIZED 10/18  . Arthritis   . Autistic disorder, residual state   . COPD (chronic obstructive pulmonary disease) ([place])   . Depression   . Developmental disorder   . Diabetes mellitus without complication ([place])   . Dyslipidemia   . Esophageal reflux   . HOH (hard of hearing)    MILDLY  . Hypertension   .  Obesity   . Overactive bladder   . Palpitations    ANXIETY  . Schizophrenia, schizoaffective ([place])   . Sleep apnea   . Tremors of nervous system    HANDS DUE TO MEDICATIONS  . Urinary incontinence     Past Surgical History:  Procedure Laterality Date  . CATARACT EXTRACTION W/PHACO Left 11/14/2017   Procedure: CATARACT EXTRACTION PHACO AND INTRAOCULAR LENS PLACEMENT (IOC);  Surgeon: Birder Robson, MD;  Location: [facility] ORS;  Service: Ophthalmology;  Laterality: Left;  [place] 00:42 AP% 14.6 CDE 6.12 Fluid pack lot # [id number]  . COLONOSCOPY  01/28/2005  . COLONOSCOPY WITH PROPOFOL N/A 05/09/2016   Procedure: COLONOSCOPY WITH PROPOFOL;  Surgeon: Manya Silvas, MD;  Location[facility] ENDOSCOPY;  Service: Endoscopy;  Laterality: N/A;  . ESOPHAGOGASTRODUODENOSCOPY N/A 05/09/2016   Procedure: ESOPHAGOGASTRODUODENOSCOPY (EGD);  Surgeon: Manya Silvas, MD;  Location: [facility] ENDOSCOPY;  Service: Endoscopy;  Laterality: N/A;  . FRACTURE SURGERY     ORIF SHOULDER  . TONSILLECTOMY     Family History:  Family History  Problem Relation Age of Onset  . Hypertension Mother   . Stroke Father   . Heart Problems Father    Family Psychiatric  History: see H&P Social History   Substance and Sexual Activity  Alcohol Use No  . Alcohol/week: 0.0 standard drinks     Social History   Substance and Sexual Activity  Drug Use No    Social History   Socioeconomic History  . Marital status: Single    Spouse name: Not on  file  . Number of children: Not on file  . Years of education: Not on file  . Highest education level: Not on file  Occupational History  . Not on file  Tobacco Use  . Smoking status: Never Smoker  . Smokeless tobacco: Never Used  Vaping Use  . Vaping Use: Never used  Substance and Sexual Activity  . Alcohol use: No    Alcohol/week: 0.0 standard drinks  . Drug use: No  . Sexual activity: Never  Other Topics Concern  . Not on file  Social History Narrative   The patient  never finished high school but did get his GED. 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 He is currently in disability and his brother Alexander Beard is his legal guardian. He has been living in group homes for many years.      No pending legal charges   Social Determinants of Health   Financial Resource Strain:   . Difficulty of Paying Living Expenses: Not on file  Food Insecurity:   . Worried About Charity fundraiser in the Last Year: Not on file  . Ran Out of Food in the Last Year: Not on file  Transportation Needs:   . Lack of Transportation (Medical): Not on file  . Lack of Transportation (Non-Medical): Not on file  Physical Activity:   . Days of Exercise per Week: Not on file  . Minutes of Exercise per Session: Not on file  Stress:   . Feeling of Stress : Not on file  Social Connections:   . Frequency of Communication with Friends and Family: Not on file  . Frequency of Social Gatherings with Friends and Family: Not on file  . Attends Religious Services: Not on file  . Active Member of Clubs or Organizations: Not on file  . Attends Archivist Meetings: Not on file  . Marital Status: Not on file   Additional Social History:                         Sleep: Fair  Appetite:  Fair  Current Medications: Current Facility-Administered Medications  Medication Dose Route Frequency Provider Last Rate Last Admin  . acetaminophen (TYLENOL) tablet 650 mg  650 mg Oral Q6H PRN [name], MD   650 mg at 05/14/20 2122  . alum & mag hydroxide-simeth (MAALOX/MYLANTA) 200-200-20 MG/5ML suspension 30 mL  30 mL Oral Q4H PRN [name], MD      . aspirin EC tablet 81 mg  81 mg Oral Daily [name], [name], MD   81 mg at 05/15/20 1610  . clonazePAM (KLONOPIN) tablet 1 mg  1 mg Oral QID [name], [name], MD   1 mg at 05/15/20 9604  . divalproex (DEPAKOTE ER) 24 hr tablet 1,500 mg  1,500 mg Oral QHS [name], [name],  MD   1,500 mg at 05/14/20 2120  . gabapentin (NEURONTIN) capsule 100 mg  100 mg Oral TID [name], [name], MD   100 mg at 05/15/20 0824  . hydrOXYzine (ATARAX/VISTARIL) tablet 10 mg  10 mg Oral TID PRN [name], MD   10 mg at 05/13/20 1519  . influenza vac split quadrivalent PF (FLUARIX) injection 0.5 mL  0.5 mL Intramuscular Tomorrow-1000 [name], [name], MD      . lamoTRIgine (LAMICTAL) tablet 100 mg  100 mg Oral QHS [name], [name], MD   100  mg at 05/14/20 2120  . linagliptin (TRADJENTA) tablet 5 mg  5 mg Oral Daily [name], [name], MD   5 mg at 05/15/20 0823  . loperamide (IMODIUM) capsule 2 mg  2 mg Oral Q4H PRN [name], MD   2 mg at 04/10/20 0917  . loratadine (CLARITIN) tablet 10 mg  10 mg Oral Daily [name], [name], MD   10 mg at 05/15/20 6195  . LORazepam (ATIVAN) tablet 2 mg  2 mg Oral Q6H PRN [name], [name], MD   2 mg at 05/02/20 1211  . magnesium hydroxide (MILK OF MAGNESIA) suspension 30 mL  30 mL Oral Daily PRN [name], MD   30 mL at 03/23/20 1344  . metFORMIN (GLUCOPHAGE) tablet 850 mg  850 mg Oral BID WC [name], [name], MD   850 mg at 05/15/20 0824  . metoprolol succinate (TOPROL-XL) 24 hr tablet 50 mg  50 mg Oral Daily [name], [name], MD   50 mg at 05/15/20 0824  . ondansetron (ZOFRAN) tablet 4 mg  4 mg Oral Q8H PRN [name], [name], MD   4 mg at 04/30/20 2035  . oxybutynin (DITROPAN) tablet 10 mg  10 mg Oral BID [name], [name], MD   10 mg at 05/15/20 0823  . pantoprazole (PROTONIX) EC tablet 40 mg  40 mg Oral Daily [name], [name], MD   40 mg at 05/15/20 0823  . prazosin (MINIPRESS) capsule 1 mg  1 mg Oral QHS [name], [name], MD   1 mg at 05/14/20 2121  . QUEtiapine (SEROQUEL) tablet 100 mg  100 mg Oral QID [name], [name], MD   100 mg at 05/15/20 0824  . QUEtiapine (SEROQUEL) tablet 400 mg  400 mg Oral QHS [name], NP   400 mg at 05/14/20 2120  . sertraline (ZOLOFT) tablet 50 mg  50 mg Oral BID [name], [name], MD   50 mg at 05/15/20 0932  .  simethicone (MYLICON) chewable tablet 80 mg  80 mg Oral TID [name], [name], MD   80 mg at 05/15/20 6712  . simvastatin (ZOCOR) tablet 20 mg  20 mg Oral q1800 [name], [name], MD   20 mg at 05/14/20 1738  . tamsulosin (FLOMAX) capsule 0.8 mg  0.8 mg Oral QPC supper [name], [name], MD   0.8 mg at 05/14/20 1739    Lab Results:  No results found for this or any previous visit (from the past 48 hour(s)).  Blood Alcohol level:  Lab Results  Component Value Date   ETH <10 01/22/2020   ETH <10 45/80/9983    Metabolic Disorder Labs: Lab Results  Component Value Date   HGBA1C 6.6 (H) 02/20/2020   MPG 142.72 02/20/2020   No results found for: PROLACTIN Lab Results  Component Value Date   CHOL 153 04/10/2015   TRIG 166 (H) 04/10/2015   HDL 50 04/10/2015   CHOLHDL 3.1 04/10/2015   VLDL 33 04/10/2015   LDLCALC 70 04/10/2015    Physical Findings: AIMS: Facial and Oral Movements Muscles of Facial Expression: None, normal Lips and Perioral Area: None, normal Jaw: None, normal Tongue: None, normal,Extremity Movements Upper (arms, wrists, hands, fingers): None, normal Lower (legs, knees, ankles, toes): None, normal, Trunk Movements Neck, shoulders, hips: None, normal, Overall Severity Severity of abnormal movements (highest score from questions above): None, normal Incapacitation due to abnormal movements: None, normal Patient's awareness of abnormal movements (rate only patient's report): No Awareness, Dental Status Current problems  with teeth and/or dentures?: No Does patient usually wear dentures?: No  CIWA:    COWS:     Musculoskeletal: Strength & Muscle Tone: within normal limits Gait & Station: normal Patient leans: N/A  Psychiatric Specialty Exam: Physical Exam Vitals and nursing note reviewed.  Constitutional:      Appearance: Normal appearance.  HENT:     Head: Normocephalic and atraumatic.     Right Ear: External ear normal.     Left Ear: External ear normal.      Nose: Nose normal.     Mouth/Throat:     Mouth: Mucous membranes are moist.     Pharynx: Oropharynx is clear.  Eyes:     Extraocular Movements: Extraocular movements intact.     Conjunctiva/sclera: Conjunctivae normal.     Pupils: Pupils are equal, round, and reactive to light.  Cardiovascular:     Rate and Rhythm: Normal rate.     Pulses: Normal pulses.  Pulmonary:     Effort: Pulmonary effort is normal.     Breath sounds: Normal breath sounds.  Abdominal:     General: Abdomen is flat.     Palpations: Abdomen is soft.  Musculoskeletal:        General: No swelling. Normal range of motion.     Cervical back: Normal range of motion and neck supple.  Skin:    General: Skin is warm and dry.  Neurological:     General: No focal deficit present.     Mental Status: He is alert and oriented to person, place, and time.  Psychiatric:        Attention and Perception: Attention and perception normal.        Mood and Affect: Mood is anxious. Affect is tearful.        Speech: Speech normal.        Behavior: Behavior is cooperative.        Thought Content: Thought content normal.        Cognition and Memory: Cognition and memory normal.        Judgment: Judgment normal.     Review of Systems  Constitutional: Negative for activity change and appetite change.  HENT: Negative for rhinorrhea and sore throat.   Eyes: Negative for photophobia and visual disturbance.  Respiratory: Negative for cough and shortness of breath.   Cardiovascular: Negative for chest pain and palpitations.  Gastrointestinal: Negative for constipation, diarrhea, nausea and vomiting.  Endocrine: Negative for cold intolerance and heat intolerance.  Genitourinary: Negative for difficulty urinating and dysuria.  Musculoskeletal: Negative for arthralgias and myalgias.  Skin: Negative for rash and wound.  Allergic/Immunologic: Negative for food allergies and immunocompromised state.  Neurological: Negative for dizziness  and headaches.  Hematological: Negative for adenopathy. Does not bruise/bleed easily.  Psychiatric/Behavioral: Negative for behavioral problems, hallucinations and suicidal ideas.    Blood pressure 134/81, pulse 72, temperature 98 F (36.7 C), temperature source Oral, resp. rate 18, height 5\' 8"  (1.727 m), weight 104.3 kg, SpO2 97 %.Body mass index is 34.96 kg/m.  General Appearance: Disheveled  Eye Contact:  Fair  Speech:  Normal  Volume:  Normal  Mood:  Anxious  Affect:  Congruent  Thought Process:  Coherent  Orientation:  Full (Time, Place, and Person)  Thought Content:  Logical  Suicidal Thoughts:  No  Homicidal Thoughts:  No  Memory:  Immediate;   Fair Recent;   Fair Remote;   Fair  Judgement:  Impaired  Insight:  Shallow  Psychomotor Activity: wnl  Concentration:  Concentration: Fair and Attention Span: Fair  Recall:  [employer] of Knowledge:  Fair  Language:  Fair  Akathisia:  No  Handed:  Right  AIMS (if indicated):     Assets:  Communication Skills Desire for Improvement Resilience Social Support  ADL's:  Intact  Cognition:  Impaired,  Mild  Sleep:  Number of Hours: 7.5     Treatment Plan Summary: Daily contact with patient to assess and evaluate symptoms and progress in treatment and Medication management   Patient is a 64 year old male with the above-stated past psychiatric history who is seen in follow-up.  Chart reviewed. Patient discussed with nursing. Patient appears stable, likely at his mental baseline. He is awaiting placement. Referrals sent by SW.    Plan:  -continue inpatient psych admission; 15-minute checks; daily contact with patient to assess and evaluate symptoms and progress in treatment; psychoeducation.  -continue scheduled medications without changes today. Marland Kitchen aspirin EC  81 mg Oral Daily  . clonazePAM  1 mg Oral QID  . divalproex  1,500 mg Oral QHS  . gabapentin  100 mg Oral TID  . influenza vac split quadrivalent PF  0.5 mL  Intramuscular Tomorrow-1000  . lamoTRIgine  100 mg Oral QHS  . linagliptin  5 mg Oral Daily  . loratadine  10 mg Oral Daily  . metFORMIN  850 mg Oral BID WC  . metoprolol succinate  50 mg Oral Daily  . oxybutynin  10 mg Oral BID  . pantoprazole  40 mg Oral Daily  . prazosin  1 mg Oral QHS  . QUEtiapine  100 mg Oral QID  . QUEtiapine  400 mg Oral QHS  . sertraline  50 mg Oral BID  . simethicone  80 mg Oral TID  . simvastatin  20 mg Oral q1800  . tamsulosin  0.8 mg Oral QPC supper   -continue PRN medications. acetaminophen, alum & mag hydroxide-simeth, hydrOXYzine, loperamide, LORazepam, magnesium hydroxide, ondansetron  -Disposition: to be determined.  Continuing to search for placement.  Salley Scarlet, MD 05/15/2020, 11:31 AM

## 2020-05-16 DIAGNOSIS — F0390 Unspecified dementia without behavioral disturbance: Secondary | ICD-10-CM

## 2020-05-16 DIAGNOSIS — F250 Schizoaffective disorder, bipolar type: Secondary | ICD-10-CM | POA: Diagnosis not present

## 2020-05-16 NOTE — Progress Notes (Signed)
[facility] MD Progress Note  05/16/2020 9:15 AM Alexander Beard  MRN:  [id number]  Principal Problem: Dementia ([place]) Diagnosis: Principal Problem:   Dementia ([place]) Active Problems:   Schizoaffective disorder, bipolar type ([place])   Active autistic disorder   Diabetes ([place])   Hypertension   Prostate hypertrophy   Intellectual disability   At risk for elopement  Alexander Beard is a 64 y.o. male pt that has a previous psychiatric history of Schizoaffective disorder who presents to the [facility] unit for treatment of psychosis.  Interval History Patient was seen today for re-evaluation.  Nursing reports no events overnight. The patient reports no issues with performing ADLs.  Patient has been medication compliant.  The patient reports no side effects from medications.    SUBJECTIVE:  Patient seen one-on-one  this morning. Pt was lying in his bed. Pt anxious, pt states another pt making him anxious, He continues to focus on leaving the hospital,  He denies suicidal or homicidal thoughts, denies any hallucinations at the time of the interview. He denies side effects from his current medications.No current physical complaints. . Encouraged him to use his deep breathing relaxation techniques today.    Total Time spent with patient: 30 minutes  Past Psychiatric History: see H&P   Past Medical History:  Past Medical History:  Diagnosis Date  . Anxiety   . Anxiety disorder due to known physiological condition    HOSPITALIZED 10/18  . Arthritis   . Autistic disorder, residual state   . COPD (chronic obstructive pulmonary disease) ([place])   . Depression   . Developmental disorder   . Diabetes mellitus without complication ([place])   . Dyslipidemia   . Esophageal reflux   . HOH (hard of hearing)    MILDLY  . Hypertension   . Obesity   . Overactive bladder   . Palpitations    ANXIETY  . Schizophrenia, schizoaffective ([place])   . Sleep apnea   . Tremors of nervous system    HANDS DUE TO MEDICATIONS  .  Urinary incontinence     Past Surgical History:  Procedure Laterality Date  . CATARACT EXTRACTION W/PHACO Left 11/14/2017   Procedure: CATARACT EXTRACTION PHACO AND INTRAOCULAR LENS PLACEMENT (IOC);  Surgeon: Birder Robson, MD;  Location: [facility] ORS;  Service: Ophthalmology;  Laterality: Left;  [place] 00:42 AP% 14.6 CDE 6.12 Fluid pack lot # [id number]  . COLONOSCOPY  01/28/2005  . COLONOSCOPY WITH PROPOFOL N/A 05/09/2016   Procedure: COLONOSCOPY WITH PROPOFOL;  Surgeon: Manya Silvas, MD;  Location: [facility] ENDOSCOPY;  Service: Endoscopy;  Laterality: N/A;  . ESOPHAGOGASTRODUODENOSCOPY N/A 05/09/2016   Procedure: ESOPHAGOGASTRODUODENOSCOPY (EGD);  Surgeon: Manya Silvas, MD;  Location: [facility] ENDOSCOPY;  Service: Endoscopy;  Laterality: N/A;  . FRACTURE SURGERY     ORIF SHOULDER  . TONSILLECTOMY     Family History:  Family History  Problem Relation Age of Onset  . Hypertension Mother   . Stroke Father   . Heart Problems Father    Family Psychiatric  History: see H&P Social History   Substance and Sexual Activity  Alcohol Use No  . Alcohol/week: 0.0 standard drinks     Social History   Substance and Sexual Activity  Drug Use No    Social History   Socioeconomic History  . Marital status: Single    Spouse name: Not on file  . Number of children: Not on file  . Years of education: Not on file  . Highest education level: Not on file  Occupational  History  . Not on file  Tobacco Use  . Smoking status: Never Smoker  . Smokeless tobacco: Never Used  Vaping Use  . Vaping Use: Never used  Substance and Sexual Activity  . Alcohol use: No    Alcohol/week: 0.0 standard drinks  . Drug use: No  . Sexual activity: Never  Other Topics Concern  . Not on file  Social History Narrative   The patient never finished high school but did get his GED. 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 He is currently in disability and  his brother Alexander Beard is his legal guardian. He has been living in group homes for many years.      No pending legal charges   Social Determinants of Health   Financial Resource Strain:   . Difficulty of Paying Living Expenses: Not on file  Food Insecurity:   . Worried About Charity fundraiser in the Last Year: Not on file  . Ran Out of Food in the Last Year: Not on file  Transportation Needs:   . Lack of Transportation (Medical): Not on file  . Lack of Transportation (Non-Medical): Not on file  Physical Activity:   . Days of Exercise per Week: Not on file  . Minutes of Exercise per Session: Not on file  Stress:   . Feeling of Stress : Not on file  Social Connections:   . Frequency of Communication with Friends and Family: Not on file  . Frequency of Social Gatherings with Friends and Family: Not on file  . Attends Religious Services: Not on file  . Active Member of Clubs or Organizations: Not on file  . Attends Archivist Meetings: Not on file  . Marital Status: Not on file   Additional Social History:                         Sleep: Fair  Appetite:  Fair  Current Medications: Current Facility-Administered Medications  Medication Dose Route Frequency Provider Last Rate Last Admin  . acetaminophen (TYLENOL) tablet 650 mg  650 mg Oral Q6H PRN [name], MD   650 mg at 05/14/20 2122  . alum & mag hydroxide-simeth (MAALOX/MYLANTA) 200-200-20 MG/5ML suspension 30 mL  30 mL Oral Q4H PRN [name], MD      . aspirin EC tablet 81 mg  81 mg Oral Daily [name], [name], MD   81 mg at 05/16/20 0851  . clonazePAM (KLONOPIN) tablet 1 mg  1 mg Oral QID [name], [name], MD   1 mg at 05/16/20 0852  . divalproex (DEPAKOTE ER) 24 hr tablet 1,500 mg  1,500 mg Oral QHS [name], [name], MD   1,500 mg at 05/15/20 2108  . gabapentin (NEURONTIN) capsule 100 mg  100 mg Oral TID [name], [name], MD   100 mg at 05/16/20 0852  . hydrOXYzine (ATARAX/VISTARIL) tablet 10 mg   10 mg Oral TID PRN [name], MD   10 mg at 05/13/20 1519  . influenza vac split quadrivalent PF (FLUARIX) injection 0.5 mL  0.5 mL Intramuscular Tomorrow-1000 [name], [name], MD      . lamoTRIgine (LAMICTAL) tablet 100 mg  100 mg Oral QHS [name], [name], MD   100 mg at 05/15/20 2108  . linagliptin (TRADJENTA) tablet 5 mg  5 mg Oral Daily [name], [name], MD   5 mg at 05/16/20 0851  .  loperamide (IMODIUM) capsule 2 mg  2 mg Oral Q4H PRN [name], MD   2 mg at 04/10/20 0917  . loratadine (CLARITIN) tablet 10 mg  10 mg Oral Daily [name], [name], MD   10 mg at 05/16/20 0851  . LORazepam (ATIVAN) tablet 2 mg  2 mg Oral Q6H PRN [name], [name], MD   2 mg at 05/02/20 1211  . magnesium hydroxide (MILK OF MAGNESIA) suspension 30 mL  30 mL Oral Daily PRN [name], MD   30 mL at 03/23/20 1344  . metFORMIN (GLUCOPHAGE) tablet 850 mg  850 mg Oral BID WC [name], [name], MD   850 mg at 05/16/20 0851  . metoprolol succinate (TOPROL-XL) 24 hr tablet 50 mg  50 mg Oral Daily [name], [name], MD   50 mg at 05/16/20 0852  . ondansetron (ZOFRAN) tablet 4 mg  4 mg Oral Q8H PRN [name], [name], MD   4 mg at 04/30/20 2035  . oxybutynin (DITROPAN) tablet 10 mg  10 mg Oral BID [name], [name], MD   10 mg at 05/16/20 0851  . pantoprazole (PROTONIX) EC tablet 40 mg  40 mg Oral Daily [name], [name], MD   40 mg at 05/16/20 0851  . prazosin (MINIPRESS) capsule 1 mg  1 mg Oral QHS [name], [name], MD   1 mg at 05/15/20 2111  . QUEtiapine (SEROQUEL) tablet 100 mg  100 mg Oral QID [name], [name], MD   100 mg at 05/16/20 0852  . QUEtiapine (SEROQUEL) tablet 400 mg  400 mg Oral QHS [name], NP   400 mg at 05/15/20 2107  . sertraline (ZOLOFT) tablet 50 mg  50 mg Oral BID [name], [name], MD   50 mg at 05/16/20 0851  . simethicone (MYLICON) chewable tablet 80 mg  80 mg Oral TID [name], [name], MD   80 mg at 05/16/20 0852  . simvastatin (ZOCOR) tablet 20 mg  20 mg Oral q1800 [name], [name], MD    20 mg at 05/15/20 1725  . tamsulosin (FLOMAX) capsule 0.8 mg  0.8 mg Oral QPC supper [name], [name], MD   0.8 mg at 05/15/20 1722    Lab Results:  No results found for this or any previous visit (from the past 48 hour(s)).  Blood Alcohol level:  Lab Results  Component Value Date   ETH <10 01/22/2020   ETH <10 76/72/0947    Metabolic Disorder Labs: Lab Results  Component Value Date   HGBA1C 6.6 (H) 02/20/2020   MPG 142.72 02/20/2020   No results found for: PROLACTIN Lab Results  Component Value Date   CHOL 153 04/10/2015   TRIG 166 (H) 04/10/2015   HDL 50 04/10/2015   CHOLHDL 3.1 04/10/2015   VLDL 33 04/10/2015   LDLCALC 70 04/10/2015    Physical Findings: AIMS: Facial and Oral Movements Muscles of Facial Expression: None, normal Lips and Perioral Area: None, normal Jaw: None, normal Tongue: None, normal,Extremity Movements Upper (arms, wrists, hands, fingers): None, normal Lower (legs, knees, ankles, toes): None, normal, Trunk Movements Neck, shoulders, hips: None, normal, Overall Severity Severity of abnormal movements (highest score from questions above): None, normal Incapacitation due to abnormal movements: None, normal Patient's awareness of abnormal movements (rate only patient's report): No Awareness, Dental Status Current problems with teeth and/or dentures?: No Does patient usually wear dentures?: No  CIWA:    COWS:     Musculoskeletal: Strength & Muscle Tone: within normal limits  Gait & Station: normal Patient leans: N/A  Psychiatric Specialty Exam: Physical Exam Vitals and nursing note reviewed.  Constitutional:      Appearance: Normal appearance.  HENT:     Head: Normocephalic and atraumatic.     Right Ear: External ear normal.     Left Ear: External ear normal.     Nose: Nose normal.     Mouth/Throat:     Mouth: Mucous membranes are moist.     Pharynx: Oropharynx is clear.  Eyes:     Extraocular Movements: Extraocular movements intact.      Conjunctiva/sclera: Conjunctivae normal.     Pupils: Pupils are equal, round, and reactive to light.  Cardiovascular:     Rate and Rhythm: Normal rate.     Pulses: Normal pulses.  Pulmonary:     Effort: Pulmonary effort is normal.     Breath sounds: Normal breath sounds.  Musculoskeletal:        General: No swelling. Normal range of motion.     Cervical back: Normal range of motion and neck supple.  Skin:    General: Skin is warm and dry.  Neurological:     General: No focal deficit present.     Mental Status: He is alert and oriented to person, place, and time.  Psychiatric:        Attention and Perception: Attention and perception normal.        Mood and Affect: Mood is anxious. Affect is tearful.        Speech: Speech normal.        Behavior: Behavior is cooperative.        Thought Content: Thought content normal.        Cognition and Memory: Cognition and memory normal.        Judgment: Judgment normal.     Review of Systems  Constitutional: Negative for activity change and appetite change.  HENT: Negative for rhinorrhea and sore throat.   Eyes: Negative for photophobia and visual disturbance.  Respiratory: Negative for cough and shortness of breath.   Cardiovascular: Negative for chest pain and palpitations.  Gastrointestinal: Negative for constipation, diarrhea, nausea and vomiting.  Endocrine: Negative for cold intolerance and heat intolerance.  Genitourinary: Negative for difficulty urinating and dysuria.  Musculoskeletal: Negative for arthralgias and myalgias.  Skin: Negative for rash and wound.  Allergic/Immunologic: Negative for food allergies and immunocompromised state.  Neurological: Negative for dizziness and headaches.  Hematological: Negative for adenopathy. Does not bruise/bleed easily.  Psychiatric/Behavioral: Negative for behavioral problems, hallucinations and suicidal ideas.    Blood pressure 110/71, pulse 89, temperature 97.8 F (36.6 C), resp.  rate 18, height 5\' 8"  (1.727 m), weight 104.3 kg, SpO2 99 %.Body mass index is 34.96 kg/m.  General Appearance: Disheveled  Eye Contact:  Fair  Speech:  repetative  Volume:  Normal  Mood:  Anxious  Affect:  Congruent, anxious  Thought Process:    Orientation:  Alert, ox2  Thought Content:  Logical  Suicidal Thoughts:  No  Homicidal Thoughts:  No  Memory:  Immediate;   Fair Recent;   Fair Remote;   Fair  Judgement:  Impaired  Insight:  Shallow  Psychomotor Activity: wnl  Concentration:  Concentration: Fair and Attention Span: Fair  Recall:  [employer] of Knowledge:  Fair  Language:  Fair  Akathisia:  No  Handed:  Right  AIMS (if indicated):     Assets:  Communication Skills Desire for Improvement Resilience Social Support  ADL's:  Intact  Cognition:  Impaired,  Mild  Sleep:  Number of Hours: 8     Treatment Plan Summary: Daily contact with patient to assess and evaluate symptoms and progress in treatment and Medication management   Patient is a 64 year old male with the above-stated past psychiatric history who is seen in follow-up.  Chart reviewed. Patient discussed with nursing. Pt anxious,  Patient appears stable, likely at his mental baseline. He is awaiting placement. Referrals sent by SW.    Plan:  -continue inpatient psych admission; 15-minute checks; daily contact with patient to assess and evaluate symptoms and progress in treatment; psychoeducation.  -continue scheduled medications without changes today. Marland Kitchen aspirin EC  81 mg Oral Daily  . clonazePAM  1 mg Oral QID  . divalproex  1,500 mg Oral QHS  . gabapentin  100 mg Oral TID  . influenza vac split quadrivalent PF  0.5 mL Intramuscular Tomorrow-1000  . lamoTRIgine  100 mg Oral QHS  . linagliptin  5 mg Oral Daily  . loratadine  10 mg Oral Daily  . metFORMIN  850 mg Oral BID WC  . metoprolol succinate  50 mg Oral Daily  . oxybutynin  10 mg Oral BID  . pantoprazole  40 mg Oral Daily  . prazosin  1  mg Oral QHS  . QUEtiapine  100 mg Oral QID  . QUEtiapine  400 mg Oral QHS  . sertraline  50 mg Oral BID  . simethicone  80 mg Oral TID  . simvastatin  20 mg Oral q1800  . tamsulosin  0.8 mg Oral QPC supper   -continue PRN medications. acetaminophen, alum & mag hydroxide-simeth, hydrOXYzine, loperamide, LORazepam, magnesium hydroxide, ondansetron  -Disposition: to be determined.  Continuing to search for placement.  Lenward Chancellor, MD 05/16/2020, 9:15 AM

## 2020-05-16 NOTE — Plan of Care (Signed)
Problem: Education: Goal: Knowledge of [facility] Education information/materials will improve Outcome: Progressing Goal: Emotional status will improve Outcome: Progressing Goal: Mental status will improve Outcome: Progressing Goal: Verbalization of understanding the information provided will improve Outcome: Progressing   Problem: Activity: Goal: Interest or engagement in activities will improve Outcome: Progressing Goal: Sleeping patterns will improve Outcome: Progressing   Problem: Coping: Goal: Ability to verbalize frustrations and anger appropriately will improve Outcome: Progressing Goal: Ability to demonstrate self-control will improve Outcome: Progressing   Problem: Health Behavior/Discharge Planning: Goal: Identification of resources available to assist in meeting health care needs will improve Outcome: Progressing Goal: Compliance with treatment plan for underlying cause of condition will improve Outcome: Progressing   Problem: Physical Regulation: Goal: Ability to maintain clinical measurements within normal limits will improve Outcome: Progressing   Problem: Safety: Goal: Periods of time without injury will increase Outcome: Progressing   Problem: Education: Goal: Ability to make informed decisions regarding treatment will improve Outcome: Progressing   Problem: Coping: Goal: Coping ability will improve Outcome: Progressing   Problem: Health Behavior/Discharge Planning: Goal: Identification of resources available to assist in meeting health care needs will improve Outcome: Progressing   Problem: Medication: Goal: Compliance with prescribed medication regimen will improve Outcome: Progressing   Problem: Self-Concept: Goal: Ability to disclose and discuss suicidal ideas will improve Outcome: Progressing Goal: Will verbalize positive feelings about self Outcome: Progressing   Problem: Education: Goal: Knowledge of [facility]  Education information/materials will improve Outcome: Progressing Goal: Emotional status will improve Outcome: Progressing Goal: Mental status will improve Outcome: Progressing Goal: Verbalization of understanding the information provided will improve Outcome: Progressing   Problem: Activity: Goal: Interest or engagement in activities will improve Outcome: Progressing Goal: Sleeping patterns will improve Outcome: Progressing   Problem: Coping: Goal: Ability to verbalize frustrations and anger appropriately will improve Outcome: Progressing Goal: Ability to demonstrate self-control will improve Outcome: Progressing   Problem: Health Behavior/Discharge Planning: Goal: Identification of resources available to assist in meeting health care needs will improve Outcome: Progressing Goal: Compliance with treatment plan for underlying cause of condition will improve Outcome: Progressing   Problem: Physical Regulation: Goal: Ability to maintain clinical measurements within normal limits will improve Outcome: Progressing   Problem: Safety: Goal: Periods of time without injury will increase Outcome: Progressing   Problem: Education: Goal: Knowledge of [facility] Education information/materials will improve Outcome: Progressing Goal: Emotional status will improve Outcome: Progressing Goal: Mental status will improve Outcome: Progressing Goal: Verbalization of understanding the information provided will improve Outcome: Progressing   Problem: Activity: Goal: Interest or engagement in activities will improve Outcome: Progressing Goal: Sleeping patterns will improve Outcome: Progressing   Problem: Coping: Goal: Ability to verbalize frustrations and anger appropriately will improve Outcome: Progressing Goal: Ability to demonstrate self-control will improve Outcome: Progressing   Problem: Health Behavior/Discharge Planning: Goal: Identification of resources available  to assist in meeting health care needs will improve Outcome: Progressing Goal: Compliance with treatment plan for underlying cause of condition will improve Outcome: Progressing   Problem: Physical Regulation: Goal: Ability to maintain clinical measurements within normal limits will improve Outcome: Progressing   Problem: Safety: Goal: Periods of time without injury will increase Outcome: Progressing   Problem: Education: Goal: Knowledge of [facility] Education information/materials will improve Outcome: Progressing Goal: Emotional status will improve Outcome: Progressing Goal: Mental status will improve Outcome: Progressing Goal: Verbalization of understanding the information provided will improve Outcome: Progressing   Problem: Activity: Goal: Interest  or engagement in activities will improve Outcome: Progressing Goal: Sleeping patterns will improve Outcome: Progressing   Problem: Coping: Goal: Ability to verbalize frustrations and anger appropriately will improve Outcome: Progressing Goal: Ability to demonstrate self-control will improve Outcome: Progressing   Problem: Health Behavior/Discharge Planning: Goal: Identification of resources available to assist in meeting health care needs will improve Outcome: Progressing Goal: Compliance with treatment plan for underlying cause of condition will improve Outcome: Progressing   Problem: Physical Regulation: Goal: Ability to maintain clinical measurements within normal limits will improve Outcome: Progressing   Problem: Safety: Goal: Periods of time without injury will increase Outcome: Progressing

## 2020-05-16 NOTE — Progress Notes (Signed)
Patient stated that he needs today to rest and does not feel like he will make any of the groups today.

## 2020-05-16 NOTE — Plan of Care (Signed)
D- Patient alert and oriented. Patient presents in a worried, but pleasant mood on assessment stating that he slept ok last night and had no complaints to voice to this Probation officer. Patient denies anxiety, but endorsed depression, reporting "about this problem I have coming up", but he did not go into detail with this Probation officer. Patient also denies SI, HI, AVH, and pain at this time. Patient's goal for today is "self-control, avoid trouble", in which he will "stay busy, rest", in order to achieve his goal. Patient stated that he does not feel up to going to any groups today, he needs to focus on his rest and the other things that he has to do. Patient has been preoccupied with writing in his composition book throughout the day.   A- Scheduled medications administered to patient, per MD orders. Support and encouragement provided.  Routine safety checks conducted every 15 minutes.  Patient informed to notify staff with problems or concerns.  R- No adverse drug reactions noted. Patient contracts for safety at this time. Patient compliant with medications and treatment plan. Patient receptive, calm, and cooperative. Patient interacts well with others on the unit.  Patient remains safe at this time.  Problem: Education: Goal: Knowledge of [facility] Education information/materials will improve Outcome: Not Progressing Goal: Emotional status will improve Outcome: Not Progressing Goal: Mental status will improve Outcome: Not Progressing Goal: Verbalization of understanding the information provided will improve Outcome: Not Progressing   Problem: Activity: Goal: Interest or engagement in activities will improve Outcome: Not Progressing Goal: Sleeping patterns will improve Outcome: Not Progressing   Problem: Coping: Goal: Ability to verbalize frustrations and anger appropriately will improve Outcome: Not Progressing Goal: Ability to demonstrate self-control will improve Outcome: Not Progressing    Problem: Health Behavior/Discharge Planning: Goal: Identification of resources available to assist in meeting health care needs will improve Outcome: Not Progressing Goal: Compliance with treatment plan for underlying cause of condition will improve Outcome: Not Progressing   Problem: Physical Regulation: Goal: Ability to maintain clinical measurements within normal limits will improve Outcome: Not Progressing   Problem: Safety: Goal: Periods of time without injury will increase Outcome: Not Progressing   Problem: Education: Goal: Ability to make informed decisions regarding treatment will improve Outcome: Not Progressing   Problem: Coping: Goal: Coping ability will improve Outcome: Not Progressing   Problem: Health Behavior/Discharge Planning: Goal: Identification of resources available to assist in meeting health care needs will improve Outcome: Not Progressing   Problem: Medication: Goal: Compliance with prescribed medication regimen will improve Outcome: Not Progressing   Problem: Self-Concept: Goal: Ability to disclose and discuss suicidal ideas will improve Outcome: Not Progressing Goal: Will verbalize positive feelings about self Outcome: Not Progressing   Problem: Education: Goal: Knowledge of [facility] Education information/materials will improve Outcome: Not Progressing Goal: Emotional status will improve Outcome: Not Progressing Goal: Mental status will improve Outcome: Not Progressing Goal: Verbalization of understanding the information provided will improve Outcome: Not Progressing   Problem: Activity: Goal: Interest or engagement in activities will improve Outcome: Not Progressing Goal: Sleeping patterns will improve Outcome: Not Progressing   Problem: Coping: Goal: Ability to verbalize frustrations and anger appropriately will improve Outcome: Not Progressing Goal: Ability to demonstrate self-control will improve Outcome: Not  Progressing   Problem: Health Behavior/Discharge Planning: Goal: Identification of resources available to assist in meeting health care needs will improve Outcome: Not Progressing Goal: Compliance with treatment plan for underlying cause of condition will improve Outcome: Not Progressing  Problem: Physical Regulation: Goal: Ability to maintain clinical measurements within normal limits will improve Outcome: Not Progressing   Problem: Safety: Goal: Periods of time without injury will increase Outcome: Not Progressing   Problem: Education: Goal: Knowledge of [place] [facility] Education information/materials will improve Outcome: Not Progressing Goal: Emotional status will improve Outcome: Not Progressing Goal: Mental status will improve Outcome: Not Progressing Goal: Verbalization of understanding the information provided will improve Outcome: Not Progressing   Problem: Activity: Goal: Interest or engagement in activities will improve Outcome: Not Progressing Goal: Sleeping patterns will improve Outcome: Not Progressing   Problem: Coping: Goal: Ability to verbalize frustrations and anger appropriately will improve Outcome: Not Progressing Goal: Ability to demonstrate self-control will improve Outcome: Not Progressing   Problem: Health Behavior/Discharge Planning: Goal: Identification of resources available to assist in meeting health care needs will improve Outcome: Not Progressing Goal: Compliance with treatment plan for underlying cause of condition will improve Outcome: Not Progressing   Problem: Physical Regulation: Goal: Ability to maintain clinical measurements within normal limits will improve Outcome: Not Progressing   Problem: Safety: Goal: Periods of time without injury will increase Outcome: Not Progressing   Problem: Education: Goal: Knowledge of [place] [facility] Education information/materials will improve Outcome: Not Progressing Goal:  Emotional status will improve Outcome: Not Progressing Goal: Mental status will improve Outcome: Not Progressing Goal: Verbalization of understanding the information provided will improve Outcome: Not Progressing   Problem: Activity: Goal: Interest or engagement in activities will improve Outcome: Not Progressing Goal: Sleeping patterns will improve Outcome: Not Progressing   Problem: Coping: Goal: Ability to verbalize frustrations and anger appropriately will improve Outcome: Not Progressing Goal: Ability to demonstrate self-control will improve Outcome: Not Progressing   Problem: Health Behavior/Discharge Planning: Goal: Identification of resources available to assist in meeting health care needs will improve Outcome: Not Progressing Goal: Compliance with treatment plan for underlying cause of condition will improve Outcome: Not Progressing   Problem: Physical Regulation: Goal: Ability to maintain clinical measurements within normal limits will improve Outcome: Not Progressing   Problem: Safety: Goal: Periods of time without injury will increase Outcome: Not Progressing

## 2020-05-16 NOTE — Progress Notes (Signed)
Patient had an outburst at shift change because the other patients wanted to watch something different on TV and he felt he was entitled to watch Wheel of Fortune and [place] because he watches them at the same time every day. Instructed patient that he is not the only patient on the unit and that he shares the TV with the other patients on the unit and they have to come to an agreement on what to watch. He got loud and tearful and slammed the door. Was given Ativan 2 mg po per prn order and he calmed down. Now, he is tearful and voicing his fear that the nursing students will like another patient on the unit's artwork better than his. Keeps perseverating about it and the TV shows. Denies SI, HI and AVH

## 2020-05-16 NOTE — [facility] Group Notes (Signed)
[facility] Group Notes: (Clinical Social Work)   05/16/2020      Type of Therapy:  Group Therapy   Participation Level:  Did Not Attend - was invited individually by Nurse/MHT and chose not to attend.   [name], [place] 05/16/2020  2:06 PM

## 2020-05-17 DIAGNOSIS — F0390 Unspecified dementia without behavioral disturbance: Secondary | ICD-10-CM | POA: Diagnosis not present

## 2020-05-17 DIAGNOSIS — F250 Schizoaffective disorder, bipolar type: Secondary | ICD-10-CM | POA: Diagnosis not present

## 2020-05-17 NOTE — Progress Notes (Addendum)
[facility] MD Progress Note  05/17/2020 11:53 AM Alexander Beard  MRN:  [id number]  Principal Problem: Dementia [facility]) Diagnosis: Principal Problem:   Dementia ([place]) Active Problems:   Schizoaffective disorder, bipolar type ([place])   Active autistic disorder   Diabetes ([place])   Hypertension   Prostate hypertrophy   Intellectual disability   At risk for elopement  Alexander Beard is a 64 y.o. male pt that has a previous psychiatric history of Schizoaffective disorder and cognitive impairment who presents to the [facility] unit for treatment of psychosis.  Interval History Patient was seen today for re-evaluation.  Nursing reports no events overnight except fpr slightly upset and irritable last eve due to not bale to see his favorite TV shows.  The patient reports no issues with performing ADLs.  Patient has been medication compliant.  The patient reports no side effects from medications.    SUBJECTIVE:  Patient seen one-on-one  this morning. Pt was lying in his bed. Pt anxious, pt still upset about " the three men" who did not let him to watch his favorite TV shows( Whe[employer] of fortune and [place]) , He continues to focus on leaving the hospital,  He denies suicidal or homicidal thoughts, denies any hallucinations at the time of the interview. He denies side effects from his current medications.No current physical complaints. .   Total Time spent with patient: 30 minutes  Past Psychiatric History: see H&P   Past Medical History:  Past Medical History:  Diagnosis Date  . Anxiety   . Anxiety disorder due to known physiological condition    HOSPITALIZED 10/18  . Arthritis   . Autistic disorder, residual state   . COPD (chronic obstructive pulmonary disease) ([place])   . Depression   . Developmental disorder   . Diabetes mellitus without complication ([place])   . Dyslipidemia   . Esophageal reflux   . HOH (hard of hearing)    MILDLY  . Hypertension   . Obesity   . Overactive bladder   . Palpitations     ANXIETY  . Schizophrenia, schizoaffective ([place])   . Sleep apnea   . Tremors of nervous system    HANDS DUE TO MEDICATIONS  . Urinary incontinence     Past Surgical History:  Procedure Laterality Date  . CATARACT EXTRACTION W/PHACO Left 11/14/2017   Procedure: CATARACT EXTRACTION PHACO AND INTRAOCULAR LENS PLACEMENT (IOC);  Surgeon: Birder Robson, MD;  Location: [facility] ORS;  Service: Ophthalmology;  Laterality: Left;  [place] 00:42 AP% 14.6 CDE 6.12 Fluid pack lot # [id number]  . COLONOSCOPY  01/28/2005  . COLONOSCOPY WITH PROPOFOL N/A 05/09/2016   Procedure: COLONOSCOPY WITH PROPOFOL;  Surgeon: Manya Silvas, MD;  Location: [facility] ENDOSCOPY;  Service: Endoscopy;  Laterality: N/A;  . ESOPHAGOGASTRODUODENOSCOPY N/A 05/09/2016   Procedure: ESOPHAGOGASTRODUODENOSCOPY (EGD);  Surgeon: Manya Silvas, MD;  Location: [facility] ENDOSCOPY;  Service: Endoscopy;  Laterality: N/A;  . FRACTURE SURGERY     ORIF SHOULDER  . TONSILLECTOMY     Family History:  Family History  Problem Relation Age of Onset  . Hypertension Mother   . Stroke Father   . Heart Problems Father    Family Psychiatric  History: see H&P Social History   Substance and Sexual Activity  Alcohol Use No  . Alcohol/week: 0.0 standard drinks     Social History   Substance and Sexual Activity  Drug Use No    Social History   Socioeconomic History  . Marital status: Single    Spouse name:  Not on file  . Number of children: Not on file  . Years of education: Not on file  . Highest education level: Not on file  Occupational History  . Not on file  Tobacco Use  . Smoking status: Never Smoker  . Smokeless tobacco: Never Used  Vaping Use  . Vaping Use: Never used  Substance and Sexual Activity  . Alcohol use: No    Alcohol/week: 0.0 standard drinks  . Drug use: No  . Sexual activity: Never  Other Topics Concern  . Not on file  Social History Narrative   The patient never finished high school but did get his GED. He  works in the past as a Museum/gallery conservator. He has never been married and has no children. He is currently in disability and [name] is his legal guardian. He has been living in group homes for many years.      No pending legal charges   Social Determinants of Health   Financial Resource Strain:   . Difficulty of Paying Living Expenses: Not on file  Food Insecurity:   . Worried About Charity fundraiser in the Last Year: Not on file  . Ran Out of Food in the Last Year: Not on file  Transportation Needs:   . Lack of Transportation (Medical): Not on file  . Lack of Transportation (Non-Medical): Not on file  Physical Activity:   . Days of Exercise per Week: Not on file  . Minutes of Exercise per Session: Not on file  Stress:   . Feeling of Stress : Not on file  Social Connections:   . Frequency of Communication with Friends and Family: Not on file  . Frequency of Social Gatherings with Friends and Family: Not on file  . Attends Religious Services: Not on file  . Active Member of Clubs or Organizations: Not on file  . Attends Archivist Meetings: Not on file  . Marital Status: Not on file   Additional Social History:                         Sleep: Fair  Appetite:  Fair  Current Medications: Current Facility-Administered Medications  Medication Dose Route Frequency Provider Last Rate Last Admin  . acetaminophen (TYLENOL) tablet 650 mg  650 mg Oral Q6H PRN [name], MD   650 mg at 05/14/20 2122  . alum & mag hydroxide-simeth (MAALOX/MYLANTA) 200-200-20 MG/5ML suspension 30 mL  30 mL Oral Q4H PRN [name], MD      . aspirin EC tablet 81 mg  81 mg Oral Daily [name], [name], MD   81 mg at 05/17/20 0829  . clonazePAM (KLONOPIN) tablet 1 mg  1 mg Oral QID [name], [name], MD   1 mg at 05/17/20 0829  . divalproex (DEPAKOTE ER) 24 hr tablet 1,500 mg  1,500 mg Oral QHS [name], [name], MD   1,500 mg at 05/16/20 2056  . gabapentin  (NEURONTIN) capsule 100 mg  100 mg Oral TID [name], [name], MD   100 mg at 05/17/20 0830  . hydrOXYzine (ATARAX/VISTARIL) tablet 10 mg  10 mg Oral TID PRN [name], MD   10 mg at 05/13/20 1519  . influenza vac split quadrivalent PF (FLUARIX) injection 0.5 mL  0.5 mL Intramuscular Tomorrow-1000 [name], [name], MD      . lamoTRIgine (LAMICTAL) tablet 100 mg  100 mg Oral QHS [name], [name], MD  100 mg at 05/16/20 2056  . linagliptin (TRADJENTA) tablet 5 mg  5 mg Oral Daily [name], [name], MD   5 mg at 05/17/20 0829  . loperamide (IMODIUM) capsule 2 mg  2 mg Oral Q4H PRN [name], MD   2 mg at 04/10/20 0917  . loratadine (CLARITIN) tablet 10 mg  10 mg Oral Daily [name], [name], MD   10 mg at 05/17/20 0830  . LORazepam (ATIVAN) tablet 2 mg  2 mg Oral Q6H PRN [name], [name], MD   2 mg at 05/16/20 1924  . magnesium hydroxide (MILK OF MAGNESIA) suspension 30 mL  30 mL Oral Daily PRN [name], MD   30 mL at 03/23/20 1344  . metFORMIN (GLUCOPHAGE) tablet 850 mg  850 mg Oral BID WC [name], [name], MD   850 mg at 05/17/20 0829  . metoprolol succinate (TOPROL-XL) 24 hr tablet 50 mg  50 mg Oral Daily [name], [name], MD   50 mg at 05/17/20 0830  . ondansetron (ZOFRAN) tablet 4 mg  4 mg Oral Q8H PRN [name], [name], MD   4 mg at 04/30/20 2035  . oxybutynin (DITROPAN) tablet 10 mg  10 mg Oral BID [name], [name], MD   10 mg at 05/17/20 0829  . pantoprazole (PROTONIX) EC tablet 40 mg  40 mg Oral Daily [name], [name], MD   40 mg at 05/17/20 0829  . prazosin (MINIPRESS) capsule 1 mg  1 mg Oral QHS [name], [name], MD   1 mg at 05/15/20 2111  . QUEtiapine (SEROQUEL) tablet 100 mg  100 mg Oral QID [name], [name], MD   100 mg at 05/17/20 0829  . QUEtiapine (SEROQUEL) tablet 400 mg  400 mg Oral QHS [name], NP   400 mg at 05/16/20 2055  . sertraline (ZOLOFT) tablet 50 mg  50 mg Oral BID [name], [name], MD   50 mg at 05/17/20 0830  . simethicone (MYLICON) chewable tablet 80 mg   80 mg Oral TID [name], [name], MD   80 mg at 05/17/20 0829  . simvastatin (ZOCOR) tablet 20 mg  20 mg Oral q1800 [name], [name], MD   20 mg at 05/16/20 1713  . tamsulosin (FLOMAX) capsule 0.8 mg  0.8 mg Oral QPC supper [name], [name], MD   0.8 mg at 05/16/20 1713    Lab Results:  No results found for this or any previous visit (from the past 48 hour(s)).  Blood Alcohol level:  Lab Results  Component Value Date   ETH <10 01/22/2020   ETH <10 78/93/8101    Metabolic Disorder Labs: Lab Results  Component Value Date   HGBA1C 6.6 (H) 02/20/2020   MPG 142.72 02/20/2020   No results found for: PROLACTIN Lab Results  Component Value Date   CHOL 153 04/10/2015   TRIG 166 (H) 04/10/2015   HDL 50 04/10/2015   CHOLHDL 3.1 04/10/2015   VLDL 33 04/10/2015   LDLCALC 70 04/10/2015    Physical Findings: AIMS: Facial and Oral Movements Muscles of Facial Expression: None, normal Lips and Perioral Area: None, normal Jaw: None, normal Tongue: None, normal,Extremity Movements Upper (arms, wrists, hands, fingers): None, normal Lower (legs, knees, ankles, toes): None, normal, Trunk Movements Neck, shoulders, hips: None, normal, Overall Severity Severity of abnormal movements (highest score from questions above): None, normal Incapacitation due to abnormal movements: None, normal Patient's awareness of abnormal movements (rate only patient's report): No Awareness, Dental Status Current  problems with teeth and/or dentures?: No Does patient usually wear dentures?: No  CIWA:    COWS:     Musculoskeletal: Strength & Muscle Tone: within normal limits Gait & Station: normal Patient leans: N/A  Psychiatric Specialty Exam: Physical Exam Vitals and nursing note reviewed.  Constitutional:      Appearance: Normal appearance.  HENT:     Head: Normocephalic and atraumatic.     Right Ear: External ear normal.     Mouth/Throat:     Mouth: Mucous membranes are moist.  Eyes:      Conjunctiva/sclera: Conjunctivae normal.  Pulmonary:     Effort: Pulmonary effort is normal.  Musculoskeletal:        General: No swelling. Normal range of motion.     Cervical back: Normal range of motion.  Skin:    General: Skin is warm and dry.  Neurological:     General: No focal deficit present.     Mental Status: He is alert and oriented to person, place, and time.  Psychiatric:        Attention and Perception: Attention and perception normal.        Mood and Affect: Mood is anxious. Affect is tearful.        Speech: Speech normal.        Behavior: Behavior is cooperative.        Cognition and Memory: Cognition and memory normal.        Judgment: Judgment normal.     Review of Systems  Constitutional: Negative for activity change and appetite change.  HENT: Negative for rhinorrhea and sore throat.   Eyes: Negative for photophobia and visual disturbance.  Respiratory: Negative for cough and shortness of breath.   Cardiovascular: Negative for chest pain and palpitations.  Gastrointestinal: Negative for constipation, diarrhea, nausea and vomiting.  Endocrine: Negative for cold intolerance and heat intolerance.  Genitourinary: Negative for difficulty urinating and dysuria.  Musculoskeletal: Negative for arthralgias and myalgias.  Skin: Negative for rash and wound.  Allergic/Immunologic: Negative for food allergies and immunocompromised state.  Neurological: Negative for dizziness and headaches.  Hematological: Negative for adenopathy. Does not bruise/bleed easily.  Psychiatric/Behavioral: Negative for behavioral problems, hallucinations and suicidal ideas.    Blood pressure 110/67, pulse 72, temperature 98 F (36.7 C), temperature source Oral, resp. rate 18, height 5\' 8"  (1.727 m), weight 104.3 kg, SpO2 100 %.Body mass index is 34.96 kg/m.  General Appearance: Disheveled, lying in bed  Eye Contact:  Fair  Speech:  repetative  Volume:  Normal  Mood:  Anxious  Affect:   Congruent, anxious  Thought Process:    Orientation:  Alert, ox2  Thought Content:   anxious and paranoid that "somebody is going to embarrass me"  Suicidal Thoughts:  No, denies  Homicidal Thoughts:  No  Memory:  Immediate;   Fair Recent;   Fair Remote;   Fair  Judgement:  Impaired  Insight:  Shallow  Psychomotor Activity: wnl  Concentration:  Concentration: Fair and Attention Span: Fair  Recall:  [employer] of Knowledge:  Fair  Language:  Fair  Akathisia:  No  Handed:  Right  AIMS (if indicated):     Assets:  Communication Skills Desire for Improvement Resilience Social Support  ADL's:  Intact  Cognition:  Impaired,  Mild  Sleep:  Number of Hours: 7.5     Treatment Plan Summary: Daily contact with patient to assess and evaluate symptoms and progress in treatment and Medication management   Patient is  a 64 year old male with the above-stated past psychiatric history who is seen in follow-up.  Chart reviewed. Patient discussed with nursing. Pt anxious,  Patient appears stable, likely at his mental baseline. He is awaiting placement. Referrals sent by SW.   cont current psych meds.  Plan:  -continue inpatient psych admission; 15-minute checks; daily contact with patient to assess and evaluate symptoms and progress in treatment; psychoeducation.  -continue scheduled medications without changes today. Marland Kitchen aspirin EC  81 mg Oral Daily  . clonazePAM  1 mg Oral QID  . divalproex  1,500 mg Oral QHS  . gabapentin  100 mg Oral TID  . influenza vac split quadrivalent PF  0.5 mL Intramuscular Tomorrow-1000  . lamoTRIgine  100 mg Oral QHS  . linagliptin  5 mg Oral Daily  . loratadine  10 mg Oral Daily  . metFORMIN  850 mg Oral BID WC  . metoprolol succinate  50 mg Oral Daily  . oxybutynin  10 mg Oral BID  . pantoprazole  40 mg Oral Daily  . prazosin  1 mg Oral QHS  . QUEtiapine  100 mg Oral QID  . QUEtiapine  400 mg Oral QHS  . sertraline  50 mg Oral BID  . simethicone   80 mg Oral TID  . simvastatin  20 mg Oral q1800  . tamsulosin  0.8 mg Oral QPC supper   -continue PRN medications. acetaminophen, alum & mag hydroxide-simeth, hydrOXYzine, loperamide, LORazepam, magnesium hydroxide, ondansetron  -Disposition: to be determined.  Continuing to search for placement.  Alexander Chancellor, MD 05/17/2020, 11:53 AMPatient ID: Alexander Beard, male   DOB: 03-04-1956, 64 y.o.   MRN: [id number]

## 2020-05-17 NOTE — Progress Notes (Signed)
Patient pleasant and cooperative. Washed his clothes. Said "those females are killing me out there playing corn hole". [name] he enjoyed his recreation time outside. Denies SI HI and AVH

## 2020-05-17 NOTE — Plan of Care (Signed)
D- Patient alert and oriented. Patient presents in a preoccupied/worried mood on assessment stating that he slept "pretty well" last night, and was fixated on a situation that happened last night. Patient denies any signs/symptoms of depression/anxiety, however, he kept talking about not being able to watch [place] because other patients were watching a movie. This Probation officer tried to redirect patient and explain to him that if there are multiple patients in the dayroom watching television, it's only fair, that what the majority of the patients want to look at goes. Patient then goes on to say that he feels as if there is going to be a "conflict with one of the patients here", regarding him and another patient being able to draw. Patient stated that "everytime this happens, trouble comes after. If and when it does, I am going to report it, and I expect something to be done about it". Patient also denies SI, HI, AVH, and pain at this time. Patient's goal for today is to "stay calm, don't be angry, be understanding", in which he will "stay peaceful, rest, take one day at a time", in order to achieve his goal.  A- Scheduled medications administered to patient, per MD orders. Support and encouragement provided.  Routine safety checks conducted every 15 minutes.  Patient informed to notify staff with problems or concerns.  R- No adverse drug reactions noted. Patient contracts for safety at this time. Patient compliant with medications and treatment plan. Patient receptive, calm, and cooperative. Patient interacts well with others on the unit.  Patient remains safe at this time.  Problem: Education: Goal: Knowledge of [facility] Education information/materials will improve Outcome: Not Progressing Goal: Emotional status will improve Outcome: Not Progressing Goal: Mental status will improve Outcome: Not Progressing Goal: Verbalization of understanding the information provided will  improve Outcome: Not Progressing   Problem: Activity: Goal: Interest or engagement in activities will improve Outcome: Not Progressing Goal: Sleeping patterns will improve Outcome: Not Progressing   Problem: Coping: Goal: Ability to verbalize frustrations and anger appropriately will improve Outcome: Not Progressing Goal: Ability to demonstrate self-control will improve Outcome: Not Progressing   Problem: Health Behavior/Discharge Planning: Goal: Identification of resources available to assist in meeting health care needs will improve Outcome: Not Progressing Goal: Compliance with treatment plan for underlying cause of condition will improve Outcome: Not Progressing   Problem: Physical Regulation: Goal: Ability to maintain clinical measurements within normal limits will improve Outcome: Not Progressing   Problem: Safety: Goal: Periods of time without injury will increase Outcome: Not Progressing   Problem: Education: Goal: Ability to make informed decisions regarding treatment will improve Outcome: Not Progressing   Problem: Coping: Goal: Coping ability will improve Outcome: Not Progressing   Problem: Health Behavior/Discharge Planning: Goal: Identification of resources available to assist in meeting health care needs will improve Outcome: Not Progressing   Problem: Medication: Goal: Compliance with prescribed medication regimen will improve Outcome: Not Progressing   Problem: Self-Concept: Goal: Ability to disclose and discuss suicidal ideas will improve Outcome: Not Progressing Goal: Will verbalize positive feelings about self Outcome: Not Progressing   Problem: Education: Goal: Knowledge of [facility] Education information/materials will improve Outcome: Not Progressing Goal: Emotional status will improve Outcome: Not Progressing Goal: Mental status will improve Outcome: Not Progressing Goal: Verbalization of understanding the information provided  will improve Outcome: Not Progressing   Problem: Activity: Goal: Interest or engagement in activities will improve Outcome: Not Progressing Goal: Sleeping patterns will improve  Outcome: Not Progressing   Problem: Coping: Goal: Ability to verbalize frustrations and anger appropriately will improve Outcome: Not Progressing Goal: Ability to demonstrate self-control will improve Outcome: Not Progressing   Problem: Health Behavior/Discharge Planning: Goal: Identification of resources available to assist in meeting health care needs will improve Outcome: Not Progressing Goal: Compliance with treatment plan for underlying cause of condition will improve Outcome: Not Progressing   Problem: Physical Regulation: Goal: Ability to maintain clinical measurements within normal limits will improve Outcome: Not Progressing   Problem: Safety: Goal: Periods of time without injury will increase Outcome: Not Progressing   Problem: Education: Goal: Knowledge of [facility] Education information/materials will improve Outcome: Not Progressing Goal: Emotional status will improve Outcome: Not Progressing Goal: Mental status will improve Outcome: Not Progressing Goal: Verbalization of understanding the information provided will improve Outcome: Not Progressing   Problem: Activity: Goal: Interest or engagement in activities will improve Outcome: Not Progressing Goal: Sleeping patterns will improve Outcome: Not Progressing   Problem: Coping: Goal: Ability to verbalize frustrations and anger appropriately will improve Outcome: Not Progressing Goal: Ability to demonstrate self-control will improve Outcome: Not Progressing   Problem: Health Behavior/Discharge Planning: Goal: Identification of resources available to assist in meeting health care needs will improve Outcome: Not Progressing Goal: Compliance with treatment plan for underlying cause of condition will improve Outcome: Not  Progressing   Problem: Physical Regulation: Goal: Ability to maintain clinical measurements within normal limits will improve Outcome: Not Progressing   Problem: Safety: Goal: Periods of time without injury will increase Outcome: Not Progressing   Problem: Education: Goal: Knowledge of [facility] Education information/materials will improve Outcome: Not Progressing Goal: Emotional status will improve Outcome: Not Progressing Goal: Mental status will improve Outcome: Not Progressing Goal: Verbalization of understanding the information provided will improve Outcome: Not Progressing   Problem: Activity: Goal: Interest or engagement in activities will improve Outcome: Not Progressing Goal: Sleeping patterns will improve Outcome: Not Progressing   Problem: Coping: Goal: Ability to verbalize frustrations and anger appropriately will improve Outcome: Not Progressing Goal: Ability to demonstrate self-control will improve Outcome: Not Progressing   Problem: Health Behavior/Discharge Planning: Goal: Identification of resources available to assist in meeting health care needs will improve Outcome: Not Progressing Goal: Compliance with treatment plan for underlying cause of condition will improve Outcome: Not Progressing   Problem: Physical Regulation: Goal: Ability to maintain clinical measurements within normal limits will improve Outcome: Not Progressing   Problem: Safety: Goal: Periods of time without injury will increase Outcome: Not Progressing

## 2020-05-17 NOTE — Plan of Care (Signed)
Problem: Education: Goal: Knowledge of [facility] Education information/materials will improve Outcome: Progressing Goal: Emotional status will improve Outcome: Progressing Goal: Mental status will improve Outcome: Progressing Goal: Verbalization of understanding the information provided will improve Outcome: Progressing   Problem: Activity: Goal: Interest or engagement in activities will improve Outcome: Progressing Goal: Sleeping patterns will improve Outcome: Progressing   Problem: Coping: Goal: Ability to verbalize frustrations and anger appropriately will improve Outcome: Progressing Goal: Ability to demonstrate self-control will improve Outcome: Progressing   Problem: Health Behavior/Discharge Planning: Goal: Identification of resources available to assist in meeting health care needs will improve Outcome: Progressing Goal: Compliance with treatment plan for underlying cause of condition will improve Outcome: Progressing   Problem: Physical Regulation: Goal: Ability to maintain clinical measurements within normal limits will improve Outcome: Progressing   Problem: Safety: Goal: Periods of time without injury will increase Outcome: Progressing   Problem: Education: Goal: Ability to make informed decisions regarding treatment will improve Outcome: Progressing   Problem: Coping: Goal: Coping ability will improve Outcome: Progressing   Problem: Health Behavior/Discharge Planning: Goal: Identification of resources available to assist in meeting health care needs will improve Outcome: Progressing   Problem: Medication: Goal: Compliance with prescribed medication regimen will improve Outcome: Progressing   Problem: Self-Concept: Goal: Ability to disclose and discuss suicidal ideas will improve Outcome: Progressing Goal: Will verbalize positive feelings about self Outcome: Progressing   Problem: Education: Goal: Knowledge of [facility]  Education information/materials will improve Outcome: Progressing Goal: Emotional status will improve Outcome: Progressing Goal: Mental status will improve Outcome: Progressing Goal: Verbalization of understanding the information provided will improve Outcome: Progressing   Problem: Activity: Goal: Interest or engagement in activities will improve Outcome: Progressing Goal: Sleeping patterns will improve Outcome: Progressing   Problem: Coping: Goal: Ability to verbalize frustrations and anger appropriately will improve Outcome: Progressing Goal: Ability to demonstrate self-control will improve Outcome: Progressing   Problem: Health Behavior/Discharge Planning: Goal: Identification of resources available to assist in meeting health care needs will improve Outcome: Progressing Goal: Compliance with treatment plan for underlying cause of condition will improve Outcome: Progressing   Problem: Physical Regulation: Goal: Ability to maintain clinical measurements within normal limits will improve Outcome: Progressing   Problem: Safety: Goal: Periods of time without injury will increase Outcome: Progressing   Problem: Education: Goal: Knowledge of [facility] Education information/materials will improve Outcome: Progressing Goal: Emotional status will improve Outcome: Progressing Goal: Mental status will improve Outcome: Progressing Goal: Verbalization of understanding the information provided will improve Outcome: Progressing   Problem: Activity: Goal: Interest or engagement in activities will improve Outcome: Progressing Goal: Sleeping patterns will improve Outcome: Progressing   Problem: Coping: Goal: Ability to verbalize frustrations and anger appropriately will improve Outcome: Progressing Goal: Ability to demonstrate self-control will improve Outcome: Progressing   Problem: Health Behavior/Discharge Planning: Goal: Identification of resources available  to assist in meeting health care needs will improve Outcome: Progressing Goal: Compliance with treatment plan for underlying cause of condition will improve Outcome: Progressing   Problem: Physical Regulation: Goal: Ability to maintain clinical measurements within normal limits will improve Outcome: Progressing   Problem: Safety: Goal: Periods of time without injury will increase Outcome: Progressing   Problem: Education: Goal: Knowledge of  [facility] Education information/materials will improve Outcome: Progressing Goal: Emotional status will improve Outcome: Progressing Goal: Mental status will improve Outcome: Progressing Goal: Verbalization of understanding the information provided will improve Outcome: Progressing   Problem: Activity: Goal: Interest  or engagement in activities will improve Outcome: Progressing Goal: Sleeping patterns will improve Outcome: Progressing   Problem: Coping: Goal: Ability to verbalize frustrations and anger appropriately will improve Outcome: Progressing Goal: Ability to demonstrate self-control will improve Outcome: Progressing   Problem: Health Behavior/Discharge Planning: Goal: Identification of resources available to assist in meeting health care needs will improve Outcome: Progressing Goal: Compliance with treatment plan for underlying cause of condition will improve Outcome: Progressing   Problem: Physical Regulation: Goal: Ability to maintain clinical measurements within normal limits will improve Outcome: Progressing   Problem: Safety: Goal: Periods of time without injury will increase Outcome: Progressing

## 2020-05-17 NOTE — [facility] Group Notes (Signed)
[facility] Group Notes: (Clinical Social Work)   05/17/2020      Type of Therapy:  Group Therapy   Participation Level:  Did Not Attend - was invited individually by Nurse/MHT and chose not to attend.   [name], [place] 05/17/2020  2:52 PM

## 2020-05-18 DIAGNOSIS — F0391 Unspecified dementia with behavioral disturbance: Secondary | ICD-10-CM | POA: Diagnosis not present

## 2020-05-18 NOTE — Progress Notes (Signed)
[facility] MD Progress Note  05/18/2020 1:40 PM Alexander Beard  MRN:  [id number]  Principal Problem: Dementia [facility]) Diagnosis: Principal Problem:   Dementia ([place]) Active Problems:   Schizoaffective disorder, bipolar type ([place])   Active autistic disorder   Diabetes ([place])   Hypertension   Prostate hypertrophy   Intellectual disability   At risk for elopement  Alexander Beard is a 64 y.o. male pt that has a previous psychiatric history of Schizoaffective disorder and cognitive impairment who presents to the [facility] unit for treatment of psychosis.  Interval History Patient was seen today for re-evaluation.  Nursing reports no events overnight.  The patient reports no issues with performing ADLs.  Patient has been medication compliant.  The patient reports no side effects from medications.    SUBJECTIVE:  Patient seen one-on-one  this morning. He notes that he is somewhat anxious about another patient on the unit displaying artwork, and others feeling it is better than his artwork. He continues to focus on leaving the hospital,  He denies suicidal or homicidal thoughts, denies any hallucinations at the time of the interview. He denies side effects from his current medications.No current physical complaints. .   Total Time spent with patient: 30 minutes  Past Psychiatric History: see H&P   Past Medical History:  Past Medical History:  Diagnosis Date  . Anxiety   . Anxiety disorder due to known physiological condition    HOSPITALIZED 10/18  . Arthritis   . Autistic disorder, residual state   . COPD (chronic obstructive pulmonary disease) ([place])   . Depression   . Developmental disorder   . Diabetes mellitus without complication ([place])   . Dyslipidemia   . Esophageal reflux   . HOH (hard of hearing)    MILDLY  . Hypertension   . Obesity   . Overactive bladder   . Palpitations    ANXIETY  . Schizophrenia, schizoaffective ([place])   . Sleep apnea   . Tremors of nervous system    HANDS DUE TO  MEDICATIONS  . Urinary incontinence     Past Surgical History:  Procedure Laterality Date  . CATARACT EXTRACTION W/PHACO Left 11/14/2017   Procedure: CATARACT EXTRACTION PHACO AND INTRAOCULAR LENS PLACEMENT (IOC);  Surgeon: Birder Robson, MD;  Location: [facility] ORS;  Service: Ophthalmology;  Laterality: Left;  [place] 00:42 AP% 14.6 CDE 6.12 Fluid pack lot # [id number]  . COLONOSCOPY  01/28/2005  . COLONOSCOPY WITH PROPOFOL N/A 05/09/2016   Procedure: COLONOSCOPY WITH PROPOFOL;  Surgeon: Manya Silvas, MD;  [facility] ENDOSCOPY;  Service: Endoscopy;  Laterality: N/A;  . ESOPHAGOGASTRODUODENOSCOPY N/A 05/09/2016   Procedure: ESOPHAGOGASTRODUODENOSCOPY (EGD);  Surgeon: Manya Silvas, MD;  Location: [facility] ENDOSCOPY;  Service: Endoscopy;  Laterality: N/A;  . FRACTURE SURGERY     ORIF SHOULDER  . TONSILLECTOMY     Family History:  Family History  Problem Relation Age of Onset  . Hypertension Mother   . Stroke Father   . Heart Problems Father    Family Psychiatric  History: see H&P Social History   Substance and Sexual Activity  Alcohol Use No  . Alcohol/week: 0.0 standard drinks     Social History   Substance and Sexual Activity  Drug Use No    Social History   Socioeconomic History  . Marital status: Single    Spouse name: Not on file  . Number of children: Not on file  . Years of education: Not on file  . Highest education level: Not on file  Occupational History  . Not on file  Tobacco Use  . Smoking status: Never Smoker  . Smokeless tobacco: Never Used  Vaping Use  . Vaping Use: Never used  Substance and Sexual Activity  . Alcohol use: No    Alcohol/week: 0.0 standard drinks  . Drug use: No  . Sexual activity: Never  Other Topics Concern  . Not on file  Social History Narrative   The patient never finished high school but did get his GED. 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 He is currently in  disability and his brother Alexander Beard is his legal guardian. He has been living in group homes for many years.      No pending legal charges   Social Determinants of Health   Financial Resource Strain:   . Difficulty of Paying Living Expenses: Not on file  Food Insecurity:   . Worried About Charity fundraiser in the Last Year: Not on file  . Ran Out of Food in the Last Year: Not on file  Transportation Needs:   . Lack of Transportation (Medical): Not on file  . Lack of Transportation (Non-Medical): Not on file  Physical Activity:   . Days of Exercise per Week: Not on file  . Minutes of Exercise per Session: Not on file  Stress:   . Feeling of Stress : Not on file  Social Connections:   . Frequency of Communication with Friends and Family: Not on file  . Frequency of Social Gatherings with Friends and Family: Not on file  . Attends Religious Services: Not on file  . Active Member of Clubs or Organizations: Not on file  . Attends Archivist Meetings: Not on file  . Marital Status: Not on file   Additional Social History:                         Sleep: Fair  Appetite:  Fair  Current Medications: Current Facility-Administered Medications  Medication Dose Route Frequency Provider Last Rate Last Admin  . acetaminophen (TYLENOL) tablet 650 mg  650 mg Oral Q6H PRN [name], MD   650 mg at 05/14/20 2122  . alum & mag hydroxide-simeth (MAALOX/MYLANTA) 200-200-20 MG/5ML suspension 30 mL  30 mL Oral Q4H PRN [name], MD      . aspirin EC tablet 81 mg  81 mg Oral Daily [name], [name], MD   81 mg at 05/18/20 0836  . clonazePAM (KLONOPIN) tablet 1 mg  1 mg Oral QID [name], [name], MD   1 mg at 05/18/20 1240  . divalproex (DEPAKOTE ER) 24 hr tablet 1,500 mg  1,500 mg Oral QHS [name], [name], MD   1,500 mg at 05/17/20 2113  . gabapentin (NEURONTIN) capsule 100 mg  100 mg Oral TID [name], [name], MD   100 mg at 05/18/20 1240  . hydrOXYzine  (ATARAX/VISTARIL) tablet 10 mg  10 mg Oral TID PRN [name], MD   10 mg at 05/13/20 1519  . influenza vac split quadrivalent PF (FLUARIX) injection 0.5 mL  0.5 mL Intramuscular Tomorrow-1000 [name], [name], MD      . lamoTRIgine (LAMICTAL) tablet 100 mg  100 mg Oral QHS [name], [name], MD   100 mg at 05/17/20 2113  . linagliptin (TRADJENTA) tablet 5 mg  5 mg Oral Daily [name], [name], MD   5 mg at 05/18/20  6237  . loperamide (IMODIUM) capsule 2 mg  2 mg Oral Q4H PRN [name], MD   2 mg at 04/10/20 0917  . loratadine (CLARITIN) tablet 10 mg  10 mg Oral Daily [name], [name], MD   10 mg at 05/18/20 0837  . LORazepam (ATIVAN) tablet 2 mg  2 mg Oral Q6H PRN [name], [name], MD   2 mg at 05/16/20 1924  . magnesium hydroxide (MILK OF MAGNESIA) suspension 30 mL  30 mL Oral Daily PRN [name], MD   30 mL at 03/23/20 1344  . metFORMIN (GLUCOPHAGE) tablet 850 mg  850 mg Oral BID WC [name], [name], MD   850 mg at 05/18/20 0836  . metoprolol succinate (TOPROL-XL) 24 hr tablet 50 mg  50 mg Oral Daily [name], [name], MD   50 mg at 05/18/20 0837  . ondansetron (ZOFRAN) tablet 4 mg  4 mg Oral Q8H PRN [name], [name], MD   4 mg at 04/30/20 2035  . oxybutynin (DITROPAN) tablet 10 mg  10 mg Oral BID [name], [name], MD   10 mg at 05/18/20 0836  . pantoprazole (PROTONIX) EC tablet 40 mg  40 mg Oral Daily [name], [name], MD   40 mg at 05/18/20 0836  . prazosin (MINIPRESS) capsule 1 mg  1 mg Oral QHS [name], [name], MD   1 mg at 05/15/20 2111  . QUEtiapine (SEROQUEL) tablet 100 mg  100 mg Oral QID [name], [name], MD   100 mg at 05/18/20 1240  . QUEtiapine (SEROQUEL) tablet 400 mg  400 mg Oral QHS [name], NP   400 mg at 05/17/20 2113  . sertraline (ZOLOFT) tablet 50 mg  50 mg Oral BID [name], [name], MD   50 mg at 05/18/20 0837  . simethicone (MYLICON) chewable tablet 80 mg  80 mg Oral TID [name], [name], MD   80 mg at 05/18/20 1240  . simvastatin (ZOCOR) tablet 20 mg  20 mg  Oral q1800 [name], [name], MD   20 mg at 05/17/20 1752  . tamsulosin (FLOMAX) capsule 0.8 mg  0.8 mg Oral QPC supper [name], [name], MD   0.8 mg at 05/17/20 1752    Lab Results:  No results found for this or any previous visit (from the past 48 hour(s)).  Blood Alcohol level:  Lab Results  Component Value Date   ETH <10 01/22/2020   ETH <10 62/83/1517    Metabolic Disorder Labs: Lab Results  Component Value Date   HGBA1C 6.6 (H) 02/20/2020   MPG 142.72 02/20/2020   No results found for: PROLACTIN Lab Results  Component Value Date   CHOL 153 04/10/2015   TRIG 166 (H) 04/10/2015   HDL 50 04/10/2015   CHOLHDL 3.1 04/10/2015   VLDL 33 04/10/2015   LDLCALC 70 04/10/2015    Physical Findings: AIMS: Facial and Oral Movements Muscles of Facial Expression: None, normal Lips and Perioral Area: None, normal Jaw: None, normal Tongue: None, normal,Extremity Movements Upper (arms, wrists, hands, fingers): None, normal Lower (legs, knees, ankles, toes): None, normal, Trunk Movements Neck, shoulders, hips: None, normal, Overall Severity Severity of abnormal movements (highest score from questions above): None, normal Incapacitation due to abnormal movements: None, normal Patient's awareness of abnormal movements (rate only patient's report): No Awareness, Dental Status Current problems with teeth and/or dentures?: No Does patient usually wear dentures?: No  CIWA:    COWS:     Musculoskeletal: Strength & Muscle Tone:  within normal limits Gait & Station: normal Patient leans: N/A  Psychiatric Specialty Exam: Physical Exam Vitals and nursing note reviewed.  Constitutional:      Appearance: Normal appearance.  HENT:     Head: Normocephalic and atraumatic.     Right Ear: External ear normal.     Mouth/Throat:     Mouth: Mucous membranes are moist.  Eyes:     Conjunctiva/sclera: Conjunctivae normal.  Pulmonary:     Effort: Pulmonary effort is normal.  Musculoskeletal:         General: No swelling. Normal range of motion.     Cervical back: Normal range of motion.  Skin:    General: Skin is warm and dry.  Neurological:     General: No focal deficit present.     Mental Status: He is alert and oriented to person, place, and time.  Psychiatric:        Attention and Perception: Attention and perception normal.        Mood and Affect: Mood is anxious. Affect is tearful.        Speech: Speech normal.        Behavior: Behavior is cooperative.        Cognition and Memory: Cognition and memory normal.        Judgment: Judgment normal.     Review of Systems  Constitutional: Negative for activity change and appetite change.  HENT: Negative for rhinorrhea and sore throat.   Eyes: Negative for photophobia and visual disturbance.  Respiratory: Negative for cough and shortness of breath.   Cardiovascular: Negative for chest pain and palpitations.  Gastrointestinal: Negative for constipation, diarrhea, nausea and vomiting.  Endocrine: Negative for cold intolerance and heat intolerance.  Genitourinary: Negative for difficulty urinating and dysuria.  Musculoskeletal: Negative for arthralgias and myalgias.  Skin: Negative for rash and wound.  Allergic/Immunologic: Negative for food allergies and immunocompromised state.  Neurological: Negative for dizziness and headaches.  Hematological: Negative for adenopathy. Does not bruise/bleed easily.  Psychiatric/Behavioral: Negative for behavioral problems, hallucinations and suicidal ideas.    Blood pressure 112/76, pulse 81, temperature 97.8 F (36.6 C), temperature source Oral, resp. rate 18, height 5\' 8"  (1.727 m), weight 104.3 kg, SpO2 98 %.Body mass index is 34.96 kg/m.  General Appearance: Disheveled  Eye Contact:  Fair  Speech:  repetative  Volume:  Normal  Mood:  Anxious  Affect:  Congruent, anxious  Thought Process:    Orientation:  Alert, ox2  Thought Content:   Anxious that "somebody is going to embarrass  me"  Suicidal Thoughts:  No, denies  Homicidal Thoughts:  No  Memory:  Immediate;   Fair Recent;   Fair Remote;   Fair  Judgement:  Impaired  Insight:  Shallow  Psychomotor Activity: wnl  Concentration:  Concentration: Fair and Attention Span: Fair  Recall:  [employer] of Knowledge:  Fair  Language:  Fair  Akathisia:  No  Handed:  Right  AIMS (if indicated):     Assets:  Communication Skills Desire for Improvement Resilience Social Support  ADL's:  Intact  Cognition:  Impaired,  Mild  Sleep:  Number of Hours: 7     Treatment Plan Summary: Daily contact with patient to assess and evaluate symptoms and progress in treatment and Medication management   Patient is a 64 year old male with the above-stated past psychiatric history who is seen in follow-up.  Chart reviewed. Patient discussed with nursing. Pt anxious, Patient appears stable, likely at his mental baseline. He  is awaiting placement. Referrals sent by SW.   cont current psych meds.  Plan:  -continue inpatient psych admission; 15-minute checks; daily contact with patient to assess and evaluate symptoms and progress in treatment; psychoeducation.  -continue scheduled medications without changes today. Marland Kitchen aspirin EC  81 mg Oral Daily  . clonazePAM  1 mg Oral QID  . divalproex  1,500 mg Oral QHS  . gabapentin  100 mg Oral TID  . influenza vac split quadrivalent PF  0.5 mL Intramuscular Tomorrow-1000  . lamoTRIgine  100 mg Oral QHS  . linagliptin  5 mg Oral Daily  . loratadine  10 mg Oral Daily  . metFORMIN  850 mg Oral BID WC  . metoprolol succinate  50 mg Oral Daily  . oxybutynin  10 mg Oral BID  . pantoprazole  40 mg Oral Daily  . prazosin  1 mg Oral QHS  . QUEtiapine  100 mg Oral QID  . QUEtiapine  400 mg Oral QHS  . sertraline  50 mg Oral BID  . simethicone  80 mg Oral TID  . simvastatin  20 mg Oral q1800  . tamsulosin  0.8 mg Oral QPC supper   -continue PRN medications. acetaminophen, alum & mag  hydroxide-simeth, hydrOXYzine, loperamide, LORazepam, magnesium hydroxide, ondansetron  -Disposition: to be determined.  Continuing to search for placement.  Salley Scarlet, MD 05/18/2020, 1:40 PMPatient ID: Alexander Beard, male   DOB: 1956/06/26, 64 y.o.   MRN: [id number]

## 2020-05-18 NOTE — Progress Notes (Signed)
Patient has been in his room, asleep, after taking his scheduled evening medications.

## 2020-05-18 NOTE — Progress Notes (Signed)
Patient asleep after having a panic attack earlier during the day. Patient was upset about still being here and not having anywhere to go. Patient not waken up for medications. Patient is asleep and without complaint at this time. Patient being monitored Q 15 minutes for safety per unit protocol. Pt remains safe on the unit.

## 2020-05-18 NOTE — Plan of Care (Signed)
D- Patient alert and oriented. Patient presents in an anxious/preoccupied mood on assessment stating that he slept "ok" last night and had no major complaints to voice to this Probation officer. Patient denies depression/anxiety, however, he is extremely preoccupied and worried about not being able to watch his television shows again tonight. Patient also stated "I've been here too long and I'm troubled over it". Patient reports that he has an idea of how he will be able to watch his shows and "I expect someone in an authority position to let them know about it". Patient also denies SI, HI, AVH, and pain at this time. Patient's goal for today is to "discharge, be patient".   A- Scheduled medications administered to patient, per MD orders. Support and encouragement provided. Routine safety checks conducted every 15 minutes. Patient informed to notify staff with problems or concerns.  R- No adverse drug reactions noted. Patient contracts for safety at this time. Patient compliant with medications and treatment plan. Patient receptive, calm, and cooperative. Patient interacts well with others on the unit.  Patient remains safe at this time.  Problem: Education: Goal: Knowledge of [place] [facility] Education information/materials will improve Outcome: Not Progressing Goal: Emotional status will improve Outcome: Not Progressing Goal: Mental status will improve Outcome: Not Progressing Goal: Verbalization of understanding the information provided will improve Outcome: Not Progressing   Problem: Activity: Goal: Interest or engagement in activities will improve Outcome: Not Progressing Goal: Sleeping patterns will improve Outcome: Not Progressing   Problem: Coping: Goal: Ability to verbalize frustrations and anger appropriately will improve Outcome: Not Progressing Goal: Ability to demonstrate self-control will improve Outcome: Not Progressing   Problem: Health Behavior/Discharge Planning: Goal:  Identification of resources available to assist in meeting health care needs will improve Outcome: Not Progressing Goal: Compliance with treatment plan for underlying cause of condition will improve Outcome: Not Progressing   Problem: Physical Regulation: Goal: Ability to maintain clinical measurements within normal limits will improve Outcome: Not Progressing   Problem: Safety: Goal: Periods of time without injury will increase Outcome: Not Progressing   Problem: Education: Goal: Ability to make informed decisions regarding treatment will improve Outcome: Not Progressing   Problem: Coping: Goal: Coping ability will improve Outcome: Not Progressing   Problem: Health Behavior/Discharge Planning: Goal: Identification of resources available to assist in meeting health care needs will improve Outcome: Not Progressing   Problem: Medication: Goal: Compliance with prescribed medication regimen will improve Outcome: Not Progressing   Problem: Self-Concept: Goal: Ability to disclose and discuss suicidal ideas will improve Outcome: Not Progressing Goal: Will verbalize positive feelings about self Outcome: Not Progressing   Problem: Education: Goal: Knowledge of [place] [facility] Education information/materials will improve Outcome: Not Progressing Goal: Emotional status will improve Outcome: Not Progressing Goal: Mental status will improve Outcome: Not Progressing Goal: Verbalization of understanding the information provided will improve Outcome: Not Progressing   Problem: Activity: Goal: Interest or engagement in activities will improve Outcome: Not Progressing Goal: Sleeping patterns will improve Outcome: Not Progressing   Problem: Coping: Goal: Ability to verbalize frustrations and anger appropriately will improve Outcome: Not Progressing Goal: Ability to demonstrate self-control will improve Outcome: Not Progressing   Problem: Health Behavior/Discharge  Planning: Goal: Identification of resources available to assist in meeting health care needs will improve Outcome: Not Progressing Goal: Compliance with treatment plan for underlying cause of condition will improve Outcome: Not Progressing   Problem: Physical Regulation: Goal: Ability to maintain clinical measurements within normal limits will improve Outcome: Not  Progressing   Problem: Safety: Goal: Periods of time without injury will increase Outcome: Not Progressing   Problem: Education: Goal: Knowledge of [place] [facility] Education information/materials will improve Outcome: Not Progressing Goal: Emotional status will improve Outcome: Not Progressing Goal: Mental status will improve Outcome: Not Progressing Goal: Verbalization of understanding the information provided will improve Outcome: Not Progressing   Problem: Activity: Goal: Interest or engagement in activities will improve Outcome: Not Progressing Goal: Sleeping patterns will improve Outcome: Not Progressing   Problem: Coping: Goal: Ability to verbalize frustrations and anger appropriately will improve Outcome: Not Progressing Goal: Ability to demonstrate self-control will improve Outcome: Not Progressing   Problem: Health Behavior/Discharge Planning: Goal: Identification of resources available to assist in meeting health care needs will improve Outcome: Not Progressing Goal: Compliance with treatment plan for underlying cause of condition will improve Outcome: Not Progressing   Problem: Physical Regulation: Goal: Ability to maintain clinical measurements within normal limits will improve Outcome: Not Progressing   Problem: Safety: Goal: Periods of time without injury will increase Outcome: Not Progressing   Problem: Education: Goal: Knowledge of [place] [facility] Education information/materials will improve Outcome: Not Progressing Goal: Emotional status will improve Outcome: Not  Progressing Goal: Mental status will improve Outcome: Not Progressing Goal: Verbalization of understanding the information provided will improve Outcome: Not Progressing   Problem: Activity: Goal: Interest or engagement in activities will improve Outcome: Not Progressing Goal: Sleeping patterns will improve Outcome: Not Progressing   Problem: Coping: Goal: Ability to verbalize frustrations and anger appropriately will improve Outcome: Not Progressing Goal: Ability to demonstrate self-control will improve Outcome: Not Progressing   Problem: Health Behavior/Discharge Planning: Goal: Identification of resources available to assist in meeting health care needs will improve Outcome: Not Progressing Goal: Compliance with treatment plan for underlying cause of condition will improve Outcome: Not Progressing   Problem: Physical Regulation: Goal: Ability to maintain clinical measurements within normal limits will improve Outcome: Not Progressing   Problem: Safety: Goal: Periods of time without injury will increase Outcome: Not Progressing

## 2020-05-18 NOTE — Plan of Care (Signed)
Patient presents at his baseline  Problem: Education: Goal: Emotional status will improve Outcome: Not Progressing Goal: Mental status will improve Outcome: Not Progressing   

## 2020-05-18 NOTE — [facility] Group Notes (Signed)
LCSW Group Therapy Note   05/18/2020 2:04 PM  Type of Therapy and Topic:  Group Therapy:  Overcoming Obstacles   Participation Level:  Active   Description of Group:    In this group patients will be encouraged to explore what they see as obstacles to their own wellness and recovery. They will be guided to discuss their thoughts, feelings, and behaviors related to these obstacles. The group will process together ways to cope with barriers, with attention given to specific choices patients can make. Each patient will be challenged to identify changes they are motivated to make in order to overcome their obstacles. This group will be process-oriented, with patients participating in exploration of their own experiences as well as giving and receiving support and challenge from other group members.   Therapeutic Goals: 1. Patient will identify personal and current obstacles as they relate to admission. 2. Patient will identify barriers that currently interfere with their wellness or overcoming obstacles.  3. Patient will identify feelings, thought process and behaviors related to these barriers. 4. Patient will identify two changes they are willing to make to overcome these obstacles:      Summary of Patient Progress Pt participated in the group process. His comments were often not pertinent to the conversation but was redirectable.     Therapeutic Modalities:   Cognitive Behavioral Therapy Solution Focused Therapy Motivational Interviewing Relapse Prevention Therapy  Hedy Camara R. [name], MSW, LCSW, [place] 05/18/2020 2:04 PM

## 2020-05-18 NOTE — [facility] Counselor (Addendum)
CSW contacted the following in an effort to locate possible bed placement:  [facility] [phone] rang twice then switched over to a fax  [place]- none there  [place]- none there  [place]- none there  [place]- none there  [place]- none there  [facility]- none there  [place] [facility] [phone] rang but no one/voicemail ever/never picked up [place] [phone] Rosalin Hawking [phone] has one bed at a multiunit housing  [place]- none there  [facility]- [facility] [phone] rang 7 times without answer [facility] [phone] rang a number of times then went to a busy signal  [place]- none there  [place] [facility] [phone] vm left [place] [facility] [phone] rang 5 without answer [facility] [phone] see notes below [facility] [phone]  CSW spoke with Alexine at [facility] [phone]) regarding potential placement. She requested initial psych assessment, PSA, and the last week of progress notes from both the psychiatrist and day shift nurses for review. Alexine requested that they be emailed [email or web address]). CSW agreed. Requested information was emailed along with copy of FL2.   Chalmers Guest. [name], MSW, LCSW, [place] 05/18/2020 4:04 PM

## 2020-05-18 NOTE — BHH Counselor (Addendum)
CSW called to follow up on previous group homes contacted in effort to fine placement:  Kathlee Nations [phone] from [facility] [place] [facility], "[facility]", [phone] CSW left HIPAA compliant voicemail.   [address] P[place]-Director, [phone]) [phone]) CSW spoke with Loma Sousa who asked that the [facility] be faxed.    [place], [place], [place]   CSW left HIPAA compliant voicemail.     [facility], [phone] Asked that information be sent to [phone].  CSW spoke with Barrington Ellison.    Marinell Blight, [phone] CSW left HIPAA compliant voicemail.      Chrystie Nose, [facility], [phone] Declined the patient at this time, for concerns that staff cannot handle them.   Dellis Anes, [phone] CSW left HIPAA compliant voicemail.      Terri Skains, [phone] CSW unable to leave message as voicemail box is full.   [facility] [phone], [phone], [phone] [place] Patient declined due to not being a good fit.     [facility], [phone] Line rang incessantly without the option of leaving a voicemail.    [place], [phone], Judeth Porch Asked that information be resent to [place] [facility], [phone], [place], [phone]. CSW received message that memory is full and that a remote access code needed to be entered.    These calls were not follow up but initial calls: [facility] No group homes identified.  [place] No group homes identified.  [place], [place] CSW left message.  Review of the programs offered on their website, CSW is unsure if program is appropriate for the patient.   [facility] No group home identified.  [facility] No group homes identified.   [place] No group homes identified.    [facility] No group homes identified.   [place] No group homes identified.   [facility] No group home identified.  [facility] No group homes identified.   [facility] [place], [place], [phone] Line rang several times then began to sound like a fax machine.  [place] No group homes identified.  [place] No group home identified.  [facility], [phone], CSW was asked to call [phone] ext [phone].  Fax [phone].  CSW spoke with [place] who reports that she will pass CSW contact information on to the co-owner and have them call this CSW back.  [place] No group home identified.   [place] No group home identified.   [facility] No group homes identified.  [place] Only home identified is an eating disorder clinic.  [facility] No group homes identified.  [facility] is an [place].  [facility] No group homes identified.  [facility] No group homes identified.   [facility] No group homes identified.  [facility] No group home identified.   [facility] No group homes identified.   [facility], [place] CSW left HIPAA compliant voicemail [facility] No group homes available.    [facility] No group homes listed.   [facility] No group homes listed.   [facility] No group homes identified.   [place], [phone] CSW received message that number was not in working order.   [facility] No group homes listed.    [employer]  [place], [place] CSW called multiple times, someone answered but as CSW began to identify herself, the person began to speak nonsensical statement and CSW terminated the call.    [facility], [phone] Two male beds available.  However, working  with a Education officer, museum who is working to fill the beds. She reports that she will follow up that social worker and then call this CSW back.   [place] [facility], [phone], QP-Mr. White [phone] CSW left HIPAA compliant voicemail.   [place] [facility], [phone], Dr. Julien Girt [phone] Seven beds available for [employer] waiver. Asked that information be faxed to [place] [facility], [phone] CSW called and was unable to speak with anyone.  Line rang incessantly.     [facility], [phone] CSW spoke with [name] who reports that she will call back once she has spoken with the group home owner.     [place] [facility], [phone] Save information as the previous group home with the same name.   [facility], [phone] Line rang incessantly then message popped up to enter "remote access code".      CSW sent the requested fax and received confirmation that all were successful.   Assunta Curtis, MSW, LCSW 05/18/2020 1:15 PM

## 2020-05-18 NOTE — BHH Counselor (Signed)
CSW called Scot Jun [phone] to follow up on placement.  CSW left HIPAA compliant voicemail.  Assunta Curtis, MSW, LCSW 05/18/2020 9:58 AM

## 2020-05-18 NOTE — Progress Notes (Signed)
Patient is continuing to be fixated on watching [place] and is demanding that staff tell the other patients that he needs to be able compromise with him. This Probation officer tried to explain to patient that the unit only has one TV and what majority of the patients want to look at is only fair. Patient proceeds to start yelling "you're going to tell me to take deep breaths and go calm down and I can't take it anymore. I can't stay here anymore", and stormed out of the medication room and went to the nurse's station door grabbing at the door knob, trying to get in. Patient is now banging on the nurse's station door, continuing to yell/scream that "I need help. I can't stay here anymore". This Probation officer, along with other staff members, were trying to calm patient down. Patient willingly walked with staff to his room and laid down, to calm down.

## 2020-05-19 MED ORDER — LORAZEPAM 2 MG PO TABS
2.0000 mg | ORAL_TABLET | Freq: Four times a day (QID) | ORAL | Status: DC | PRN
  Administered 2020-05-20 – 2020-06-05 (×7): 2 mg via ORAL
  Filled 2020-05-19 (×8): qty 1

## 2020-05-19 NOTE — Tx Team (Signed)
Interdisciplinary Treatment and Diagnostic Plan Update  05/19/2020 Time of Session: 8:30 AM  Alexander Beard MRN: [id number]  Principal Diagnosis: Dementia [facility])  Secondary Diagnoses: Principal Problem:   Dementia ([place]) Active Problems:   Schizoaffective disorder, bipolar type ([place])   Active autistic disorder   Diabetes ([place])   Hypertension   Prostate hypertrophy   Intellectual disability   At risk for elopement   Current Medications:  Current Facility-Administered Medications  Medication Dose Route Frequency Provider Last Rate Last Admin  . acetaminophen (TYLENOL) tablet 650 mg  650 mg Oral Q6H PRN [name], MD   650 mg at 05/14/20 2122  . alum & mag hydroxide-simeth (MAALOX/MYLANTA) 200-200-20 MG/5ML suspension 30 mL  30 mL Oral Q4H PRN [name], MD      . aspirin EC tablet 81 mg  81 mg Oral Daily [name], [name], MD   81 mg at 05/18/20 0836  . clonazePAM (KLONOPIN) tablet 1 mg  1 mg Oral QID [name], [name], MD   1 mg at 05/19/20 0821  . divalproex (DEPAKOTE ER) 24 hr tablet 1,500 mg  1,500 mg Oral QHS [name], [name], MD   1,500 mg at 05/17/20 2113  . gabapentin (NEURONTIN) capsule 100 mg  100 mg Oral TID [name], [name], MD   100 mg at 05/18/20 1700  . hydrOXYzine (ATARAX/VISTARIL) tablet 10 mg  10 mg Oral TID PRN [name], MD   10 mg at 05/13/20 1519  . influenza vac split quadrivalent PF (FLUARIX) injection 0.5 mL  0.5 mL Intramuscular Tomorrow-1000 [name], [name], MD      . lamoTRIgine (LAMICTAL) tablet 100 mg  100 mg Oral QHS [name], [name], MD   100 mg at 05/17/20 2113  . linagliptin (TRADJENTA) tablet 5 mg  5 mg Oral Daily [name], [name], MD   5 mg at 05/18/20 0836  . loperamide (IMODIUM) capsule 2 mg  2 mg Oral Q4H PRN [name], MD   2 mg at 04/10/20 0917  . loratadine (CLARITIN) tablet 10 mg  10 mg Oral Daily [name], [name], MD   10 mg at 05/18/20 0837  . LORazepam (ATIVAN) tablet 2 mg  2 mg Oral Q6H PRN [name], [name], MD   2 mg at  05/19/20 0821  . magnesium hydroxide (MILK OF MAGNESIA) suspension 30 mL  30 mL Oral Daily PRN [name], MD   30 mL at 03/23/20 1344  . metFORMIN (GLUCOPHAGE) tablet 850 mg  850 mg Oral BID WC [name], [name], MD   850 mg at 05/18/20 1701  . metoprolol succinate (TOPROL-XL) 24 hr tablet 50 mg  50 mg Oral Daily [name], [name], MD   50 mg at 05/18/20 0837  . ondansetron (ZOFRAN) tablet 4 mg  4 mg Oral Q8H PRN [name], [name], MD   4 mg at 04/30/20 2035  . oxybutynin (DITROPAN) tablet 10 mg  10 mg Oral BID [name], [name], MD   10 mg at 05/18/20 1700  . pantoprazole (PROTONIX) EC tablet 40 mg  40 mg Oral Daily [name], [name], MD   40 mg at 05/18/20 0836  . prazosin (MINIPRESS) capsule 1 mg  1 mg Oral QHS [name], [name], MD   1 mg at 05/15/20 2111  . QUEtiapine (SEROQUEL) tablet 100 mg  100 mg Oral QID [name], [name], MD   100 mg at 05/18/20 1700  . QUEtiapine (SEROQUEL) tablet 400 mg  400 mg Oral QHS [name],  NP   400 mg at 05/17/20 2113  . sertraline (ZOLOFT) tablet 50 mg  50 mg Oral BID [name], [name], MD   50 mg at 05/18/20 1701  . simethicone (MYLICON) chewable tablet 80 mg  80 mg Oral TID [name], [name], MD   80 mg at 05/18/20 1700  . simvastatin (ZOCOR) tablet 20 mg  20 mg Oral q1800 [name], [name], MD   20 mg at 05/18/20 1701  . tamsulosin (FLOMAX) capsule 0.8 mg  0.8 mg Oral QPC supper [name], [name], MD   0.8 mg at 05/18/20 1701   PTA Medications: Medications Prior to Admission  Medication Sig Dispense Refill Last Dose  . acetaminophen (TYLENOL) 650 MG CR tablet Take 650 mg by mouth every 12 (twelve) hours as needed for pain.     [place] Kitchen aspirin EC 81 MG tablet Take 81 mg by mouth daily.     . Calcium Carbonate-Vitamin D3 (CALCIUM 600-D) 600-400 MG-UNIT TABS Take 1 tablet by mouth 2 (two) times daily.     . Cholecalciferol (VITAMIN D-1000 MAX ST) 1000 UNITS tablet Take 1,000 Units by mouth daily.      Marland Kitchen docusate sodium (COLACE) 100 MG capsule Take 100 mg by mouth  daily.      . furosemide (LASIX) 20 MG tablet Take 20 mg by mouth daily.      Marland Kitchen gabapentin (NEURONTIN) 100 MG capsule Take 1 capsule (100 mg total) by mouth 3 (three) times daily. 90 capsule 10   . Insulin Detemir (LEVEMIR FLEXTOUCH) 100 UNIT/ML Pen Inject 15 Units into the skin daily at 10 pm.     . lamoTRIgine (LAMICTAL) 100 MG tablet Take 1 tablet (100 mg total) by mouth at bedtime. 30 tablet 10   . loratadine (CLARITIN) 10 MG tablet Take 1 tablet (10 mg total) by mouth daily. 30 tablet 5   . LORazepam (ATIVAN) 1 MG tablet Take 1 tablet (1 mg total) by mouth 3 (three) times daily. 90 tablet 5   . lurasidone (LATUDA) 40 MG TABS tablet Take 1 tablet (40 mg total) by mouth daily. with food 30 tablet 10   . metoprolol succinate (TOPROL-XL) 50 MG 24 hr tablet Take 50 mg by mouth daily.      . NON FORMULARY cpap device     . oxybutynin (DITROPAN) 5 MG tablet Take 5 mg by mouth daily.     . pantoprazole (PROTONIX) 40 MG tablet Take 40 mg by mouth daily.     . polyethylene glycol (MIRALAX / GLYCOLAX) packet Take 17 g by mouth daily.     . QUEtiapine (SEROQUEL) 100 MG tablet TAKE 1 TABLET  BY MOUTH THREE TIMES A DAY (Patient taking differently: Take 100 mg by mouth 3 (three) times daily. TAKE 1 TABLET  BY MOUTH THREE TIMES A DAY) 90 tablet 11   . QUEtiapine (SEROQUEL) 400 MG tablet Take 1 tablet (400 mg total) by mouth at bedtime. 30 tablet 10   . sertraline (ZOLOFT) 100 MG tablet Take 2 tablets (200 mg total) by mouth daily. (Patient taking differently: Take 200 mg by mouth daily. Take along with two 50 mg tablets (100 mg) for total 300 mg daily) 60 tablet 5   . sertraline (ZOLOFT) 50 MG tablet Take 2 tablets (100 mg total) by mouth daily. (Patient taking differently: Take 100 mg by mouth daily. Take along with two 100 mg tablets (200 mg) for total 300 mg daily) 60 tablet 5   . simethicone (MYLICON) 80  MG chewable tablet Chew 80-160 mg by mouth 2 (two) times daily as needed for flatulence.     .  simvastatin (ZOCOR) 20 MG tablet Take 20 mg by mouth at bedtime.      . sitaGLIPtin (JANUVIA) 100 MG tablet Take 100 mg by mouth daily.     . solifenacin (VESICARE) 5 MG tablet Take 5 mg by mouth daily.     . Starch (HEMORRHOIDAL RE) Place 1 application rectally 4 (four) times daily as needed (for itching).     . tamsulosin (FLOMAX) 0.4 MG CAPS capsule Take 0.4 mg by mouth daily.       Patient Stressors: Health problems Marital or family conflict Medication change or noncompliance Traumatic event  Patient Strengths: Motivation for treatment/growth Religious Affiliation  Treatment Modalities: Medication Management, Group therapy, Case management,  1 to 1 session with clinician, Psychoeducation, Recreational therapy.   Physician Treatment Plan for Primary Diagnosis: Dementia [facility]) Long Term Goal(s): Improvement in symptoms so as ready for discharge Improvement in symptoms so as ready for discharge   Short Term Goals: Ability to verbalize feelings will improve Ability to demonstrate self-control will improve Ability to maintain clinical measurements within normal limits will improve Compliance with prescribed medications will improve  Medication Management: Evaluate patient's response, side effects, and tolerance of medication regimen.  Therapeutic Interventions: 1 to 1 sessions, Unit Group sessions and Medication administration.  Evaluation of Outcomes: Progressing  Physician Treatment Plan for Secondary Diagnosis: Principal Problem:   Dementia ([place]) Active Problems:   Schizoaffective disorder, bipolar type ([place])   Active autistic disorder   Diabetes ([place])   Hypertension   Prostate hypertrophy   Intellectual disability   At risk for elopement  Long Term Goal(s): Improvement in symptoms so as ready for discharge Improvement in symptoms so as ready for discharge   Short Term Goals: Ability to verbalize feelings will improve Ability to demonstrate self-control will  improve Ability to maintain clinical measurements within normal limits will improve Compliance with prescribed medications will improve     Medication Management: Evaluate patient's response, side effects, and tolerance of medication regimen.  Therapeutic Interventions: 1 to 1 sessions, Unit Group sessions and Medication administration.  Evaluation of Outcomes: Progressing   RN Treatment Plan for Primary Diagnosis: Dementia ([place]) Long Term Goal(s): Knowledge of disease and therapeutic regimen to maintain health will improve  Short Term Goals: Ability to demonstrate self-control, Ability to participate in decision making will improve, Ability to verbalize feelings will improve, Ability to identify and develop effective coping behaviors will improve and Compliance with prescribed medications will improve  Medication Management: RN will administer medications as ordered by provider, will assess and evaluate patient's response and provide education to patient for prescribed medication. RN will report any adverse and/or side effects to prescribing provider.  Therapeutic Interventions: 1 on 1 counseling sessions, Psychoeducation, Medication administration, Evaluate responses to treatment, Monitor vital signs and CBGs as ordered, Perform/monitor CIWA, COWS, AIMS and Fall Risk screenings as ordered, Perform wound care treatments as ordered.  Evaluation of Outcomes: Progressing   LCSW Treatment Plan for Primary Diagnosis: Dementia ([place]) Long Term Goal(s): Safe transition to appropriate next level of care at discharge, Engage patient in therapeutic group addressing interpersonal concerns.  Short Term Goals: Engage patient in aftercare planning with referrals and resources, Increase social support, Increase ability to appropriately verbalize feelings, Increase emotional regulation and Increase skills for wellness and recovery  Therapeutic Interventions: Assess for all discharge needs, 1 to 1 time  with Social  worker, Explore available resources and support systems, Assess for adequacy in community support network, Educate family and significant other(s) on suicide prevention, Complete Psychosocial Assessment, Interpersonal group therapy.  Evaluation of Outcomes: Progressing   Progress in Treatment: Attending groups: Yes. Participating in groups: Yes. Taking medication as prescribed: Yes. Toleration medication: Yes. Family/Significant other contact made: Yes, individual(s) contacted:  [place], brother  Patient understands diagnosis: Yes. Discussing patient identified problems/goals with staff: Yes. Medical problems stabilized or resolved: Yes. Denies suicidal/homicidal ideation: Yes. Issues/concerns per patient self-inventory: No. Other: N/A  New problem(s) identified: No, Describe:  None  New Short Term/Long Term Goal(s): Elimination of symptoms of psychosis, medication management for mood stabilization; elimination of SI thoughts; development of comprehensive mental wellness/sobriety plan.Update 03/01/20:No changes at this time. 03/07/20: Update, no changes at this time Update 03/12/2020: No changes at this time.Update 03/17/2020:No changes at this time. Update 03/21/20:No changes at this time. Update 03/26/20: No changes at this time. Update 03/31/2020: No changes at this time.Update 04/04/20: No changes at this time Update 04/09/2020: No changes at this time. Update 04/14/2020: No changes at this time. Update 04/20/2020:No changes at this time. Update: 04/25/2020: No changes at this time. Update 04/30/2020:  No changes at this time. Update 05/05/2020: No changes at this time. Update 05/09/20: No changes at this time. Update 05/14/20: No changes at this time.Update 05/19/2020: No changes at this time.  Patient Goals:  Patient stated he would like to go back to [place] and reconnect with his peer support. Patient also stated that he would like to increase his social support and help  his brother.Update 03/07/20,no change Update 03/12/2020: No changes at this time. Update 03/17/2020:No changes at this time.Update 03/21/20: No changes at this time. Update 03/26/20: No changes at this time. Update 03/31/2020: No changes at this time. Update9/25/21:No changes at this time. Update 04/09/2020: No changes at this time. Update 04/14/2020: No changes at this time. Update 04/20/2020:No changes at this time. 04/25/2020: Patient still indicates that he would like to go back to his old ways and life. Patient would like to get back into the community. 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 Update 04/30/2020:  No changes at this time. Update 05/05/2020: No changes at this time. Patient maintains that he wants to get back to his old way of life, volunteer in the community, do his artwork, and help his brother. Update 05/09/20: No changes at this time. Update 05/14/20: No changes at this time.  Update 05/19/2020: No changes at this time.  Discharge Plan or Barriers: Patient has an interview with a group home on 03/09/20 at 2:00 PM. Update 03/12/2020: CSW continues to assist the patient in developing a housing plan.Update 03/17/2020:CSW has assisted patient in completing an interview with a group home, however, there has been no word on if the patient has been approved. Patient continues to struggle with his anxiety and nerves. Nurses report that he has increased in bed wetting. Psychiatrist and nurses believe that this may be related to his increased anxiety about finding a home.Update 03/21/20: Patient is still nervous and anxious about finding a new placement. Patient stated he could not stay here too much longer and wanted his life back. Update 03/26/20: CSW continues to look for placement for pt. Group Homes, [place] are being considered. Update 03/31/2020: Patient continues to have interview with group homes, however, no success at this time in  identifying a home that has accepted him.Update: 04/04/20:No changes  at this time. Update 04/09/2020: CSW continues to look for placement for patient. Update 04/14/2020: No changes at this time. CSW continues to look for placement. Update 04/20/2020:CSW continues to assist with placement needs. 04/25/2020: CSW continues to look for placement options. Update 04/30/2020:  CSW continues to look for placement for the patient.  Patient has been declined for 17 SNF's however, there are a quite a few where the referral is pending.  CSW has contacted several group homes, however, none have selected the patient. Update 05/05/2020: CSW continues to look for placement for the patient. Patient has been declined at 76 SNF's with quite a few referrals left pending. CSW also sent out referrals for memory care facilities. CSW continues to contact groups homes without any selecting the patient at the moment. Update 05/09/20: CSW will continue to find appropriate placement for patient. Update 05/14/20: CSW will continue to find appropriate placement for patient.  Update 05/19/2020: CSW will continue to assist patient in identifying appropriate placement.   Reason for Continuation of Hospitalization: Depression Medication stabilization  Estimated Length of Stay: TBD   Attendees: Patient: 05/19/2020 9:20 AM  Physician: Selina Cooley, MD 05/19/2020 9:20 AM  Nursing: Graceann Congress, RN 05/19/2020 9:20 AM  RN Care Manager: 05/19/2020 9:20 AM  Social Worker: Assunta Curtis, MSW, LCSW 05/19/2020 9:20 AM  Recreational Therapist:  05/19/2020 9:20 AM  Other: Gwynneth Munson" [place], LCSW 05/19/2020 9:20 AM  Other: Paulla Dolly, Latanya Presser, LCASA 05/19/2020 9:20 AM  Other: 05/19/2020 9:20 AM    Scribe for Treatment Team: [name], LCSW 05/19/2020 9:20 AM

## 2020-05-19 NOTE — Progress Notes (Signed)
[facility] MD Progress Note  05/19/2020 12:13 PM JONNATAN HANNERS  MRN:  [id number]  Principal Problem: Dementia [facility]) Diagnosis: Principal Problem:   Dementia ([place]) Active Problems:   Schizoaffective disorder, bipolar type ([place])   Active autistic disorder   Diabetes ([place])   Hypertension   Prostate hypertrophy   Intellectual disability   At risk for elopement  Mr.Cannell is a 64 y.o. male pt that has a previous psychiatric history of Schizoaffective disorder and cognitive impairment who presents to the [facility] unit for treatment of psychosis.  Interval History Patient was seen today for re-evaluation.  Nursing reports panic attack yesterday evening when not able to watch [place], and again this morning after seeing a different patient also hung up a piece of their artwork.  The patient reports no issues with performing ADLs.  Patient has been medication compliant.  The patient reports no side effects from medications.    SUBJECTIVE:  Patient seen one-on-one  this morning. He continues to fixate on watching Wheel of Fortune and Jeopardy tonight on TV. He is reminded of unit policy to turn the TV to the program the majority of people in the room want to watch. Reminded that prior to two days ago the patients had always opted to watch his shows. He also reports anxiety about another patient hanging up his artwork, and concern that people will feel it is better than his own art. Reassurance provided to patient that all art is unique and beautiful. Overnight and this morning patient was able to return to his room, lay down, take deep breathes, and pray until his panic attack subsided.  He continues to focus on leaving the hospital,  He denies suicidal or homicidal thoughts, denies any hallucinations at the time of the interview. He denies side effects from his current medications.No current physical complaints. .   Total Time spent with patient: 30 minutes  Past Psychiatric History: see  H&P   Past Medical History:  Past Medical History:  Diagnosis Date  . Anxiety   . Anxiety disorder due to known physiological condition    HOSPITALIZED 10/18  . Arthritis   . Autistic disorder, residual state   . COPD (chronic obstructive pulmonary disease) ([place])   . Depression   . Developmental disorder   . Diabetes mellitus without complication ([place])   . Dyslipidemia   . Esophageal reflux   . HOH (hard of hearing)    MILDLY  . Hypertension   . Obesity   . Overactive bladder   . Palpitations    ANXIETY  . Schizophrenia, schizoaffective ([place])   . Sleep apnea   . Tremors of nervous system    HANDS DUE TO MEDICATIONS  . Urinary incontinence     Past Surgical History:  Procedure Laterality Date  . CATARACT EXTRACTION W/PHACO Left 11/14/2017   Procedure: CATARACT EXTRACTION PHACO AND INTRAOCULAR LENS PLACEMENT (IOC);  Surgeon: Birder Robson, MD;  Location: [facility] ORS;  Service: Ophthalmology;  Laterality: Left;  [place] 00:42 AP% 14.6 CDE 6.12 Fluid pack lot # [id number]  . COLONOSCOPY  01/28/2005  . COLONOSCOPY WITH PROPOFOL N/A 05/09/2016   Procedure: COLONOSCOPY WITH PROPOFOL;  Surgeon: Manya Silvas, MD;  Location: [facility] ENDOSCOPY;  Service: Endoscopy;  Laterality: N/A;  . ESOPHAGOGASTRODUODENOSCOPY N/A 05/09/2016   Procedure: ESOPHAGOGASTRODUODENOSCOPY (EGD);  Surgeon: Manya Silvas, MD;  Location: [facility] ENDOSCOPY;  Service: Endoscopy;  Laterality: N/A;  . FRACTURE SURGERY     ORIF SHOULDER  . TONSILLECTOMY  Family History:  Family History  Problem Relation Age of Onset  . Hypertension Mother   . Stroke Father   . Heart Problems Father    Family Psychiatric  History: see H&P Social History   Substance and Sexual Activity  Alcohol Use No  . Alcohol/week: 0.0 standard drinks     Social History   Substance and Sexual Activity  Drug Use No    Social History   Socioeconomic History  . Marital status: Single    Spouse name: Not on file  . Number of  children: Not on file  . Years of education: Not on file  . Highest education level: Not on file  Occupational History  . Not on file  Tobacco Use  . Smoking status: Never Smoker  . Smokeless tobacco: Never Used  Vaping Use  . Vaping Use: Never used  Substance and Sexual Activity  . Alcohol use: No    Alcohol/week: 0.0 standard drinks  . Drug use: No  . Sexual activity: Never  Other Topics Concern  . Not on file  Social History Narrative   The patient never finished high school but did get his GED. 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 He is currently in disability and [name] is his legal guardian. He has been living in group homes for many years.      No pending legal charges   Social Determinants of Health   Financial Resource Strain:   . Difficulty of Paying Living Expenses: Not on file  Food Insecurity:   . Worried About Charity fundraiser in the Last Year: Not on file  . Ran Out of Food in the Last Year: Not on file  Transportation Needs:   . Lack of Transportation (Medical): Not on file  . Lack of Transportation (Non-Medical): Not on file  Physical Activity:   . Days of Exercise per Week: Not on file  . Minutes of Exercise per Session: Not on file  Stress:   . Feeling of Stress : Not on file  Social Connections:   . Frequency of Communication with Friends and Family: Not on file  . Frequency of Social Gatherings with Friends and Family: Not on file  . Attends Religious Services: Not on file  . Active Member of Clubs or Organizations: Not on file  . Attends Archivist Meetings: Not on file  . Marital Status: Not on file   Additional Social History:                         Sleep: Fair  Appetite:  Fair  Current Medications: Current Facility-Administered Medications  Medication Dose Route Frequency Provider Last Rate Last Admin  . acetaminophen (TYLENOL) tablet 650  mg  650 mg Oral Q6H PRN [name], MD   650 mg at 05/14/20 2122  . alum & mag hydroxide-simeth (MAALOX/MYLANTA) 200-200-20 MG/5ML suspension 30 mL  30 mL Oral Q4H PRN [name], MD      . aspirin EC tablet 81 mg  81 mg Oral Daily [name], [name], MD   81 mg at 05/19/20 0955  . clonazePAM (KLONOPIN) tablet 1 mg  1 mg Oral QID [name], [name], MD   1 mg at 05/19/20 0821  . divalproex (DEPAKOTE ER) 24 hr tablet 1,500 mg  1,500 mg Oral QHS [name], [name], MD   1,500 mg at 05/17/20  2113  . gabapentin (NEURONTIN) capsule 100 mg  100 mg Oral TID [name], [name], MD   100 mg at 05/19/20 0954  . hydrOXYzine (ATARAX/VISTARIL) tablet 10 mg  10 mg Oral TID PRN [name], MD   10 mg at 05/13/20 1519  . influenza vac split quadrivalent PF (FLUARIX) injection 0.5 mL  0.5 mL Intramuscular Tomorrow-1000 [name], [name], MD      . lamoTRIgine (LAMICTAL) tablet 100 mg  100 mg Oral QHS [name], [name], MD   100 mg at 05/17/20 2113  . linagliptin (TRADJENTA) tablet 5 mg  5 mg Oral Daily [name], [name], MD   5 mg at 05/19/20 0955  . loperamide (IMODIUM) capsule 2 mg  2 mg Oral Q4H PRN [name], MD   2 mg at 04/10/20 0917  . loratadine (CLARITIN) tablet 10 mg  10 mg Oral Daily [name], [name], MD   10 mg at 05/19/20 0955  . LORazepam (ATIVAN) tablet 2 mg  2 mg Oral Q6H PRN Salley Scarlet, MD      . magnesium hydroxide (MILK OF MAGNESIA) suspension 30 mL  30 mL Oral Daily PRN [name], MD   30 mL at 03/23/20 1344  . metFORMIN (GLUCOPHAGE) tablet 850 mg  850 mg Oral BID WC [name], [name], MD   850 mg at 05/19/20 0956  . metoprolol succinate (TOPROL-XL) 24 hr tablet 50 mg  50 mg Oral Daily [name], [name], MD   50 mg at 05/19/20 0956  . ondansetron (ZOFRAN) tablet 4 mg  4 mg Oral Q8H PRN [name], [name], MD   4 mg at 04/30/20 2035  . oxybutynin (DITROPAN) tablet 10 mg  10 mg Oral BID [name], [name], MD   10 mg at 05/19/20 0956  . pantoprazole (PROTONIX) EC tablet 40 mg  40 mg Oral  Daily [name], [name], MD   40 mg at 05/19/20 0955  . prazosin (MINIPRESS) capsule 1 mg  1 mg Oral QHS [name], [name], MD   1 mg at 05/15/20 2111  . QUEtiapine (SEROQUEL) tablet 100 mg  100 mg Oral QID [name], [name], MD   100 mg at 05/19/20 0954  . QUEtiapine (SEROQUEL) tablet 400 mg  400 mg Oral QHS [name], NP   400 mg at 05/17/20 2113  . sertraline (ZOLOFT) tablet 50 mg  50 mg Oral BID [name], [name], MD   50 mg at 05/19/20 0955  . simethicone (MYLICON) chewable tablet 80 mg  80 mg Oral TID [name], [name], MD   80 mg at 05/19/20 0955  . simvastatin (ZOCOR) tablet 20 mg  20 mg Oral q1800 [name], [name], MD   20 mg at 05/18/20 1701  . tamsulosin (FLOMAX) capsule 0.8 mg  0.8 mg Oral QPC supper [name], [name], MD   0.8 mg at 05/18/20 1701    Lab Results:  No results found for this or any previous visit (from the past 48 hour(s)).  Blood Alcohol level:  Lab Results  Component Value Date   ETH <10 01/22/2020   ETH <10 64/33/2951    Metabolic Disorder Labs: Lab Results  Component Value Date   HGBA1C 6.6 (H) 02/20/2020   MPG 142.72 02/20/2020   No results found for: PROLACTIN Lab Results  Component Value Date   CHOL 153 04/10/2015   TRIG 166 (H) 04/10/2015   HDL 50 04/10/2015   CHOLHDL 3.1 04/10/2015   VLDL 33 04/10/2015   LDLCALC  70 04/10/2015    Physical Findings: AIMS: Facial and Oral Movements Muscles of Facial Expression: None, normal Lips and Perioral Area: None, normal Jaw: None, normal Tongue: None, normal,Extremity Movements Upper (arms, wrists, hands, fingers): None, normal Lower (legs, knees, ankles, toes): None, normal, Trunk Movements Neck, shoulders, hips: None, normal, Overall Severity Severity of abnormal movements (highest score from questions above): None, normal Incapacitation due to abnormal movements: None, normal Patient's awareness of abnormal movements (rate only patient's report): No Awareness, Dental Status Current problems  with teeth and/or dentures?: No Does patient usually wear dentures?: No  CIWA:    COWS:     Musculoskeletal: Strength & Muscle Tone: within normal limits Gait & Station: normal Patient leans: N/A  Psychiatric Specialty Exam: Physical Exam Vitals and nursing note reviewed.  Constitutional:      Appearance: Normal appearance.  HENT:     Head: Normocephalic and atraumatic.     Right Ear: External ear normal.     Mouth/Throat:     Mouth: Mucous membranes are moist.  Eyes:     Conjunctiva/sclera: Conjunctivae normal.  Pulmonary:     Effort: Pulmonary effort is normal.  Musculoskeletal:        General: No swelling. Normal range of motion.     Cervical back: Normal range of motion.  Skin:    General: Skin is warm and dry.  Neurological:     General: No focal deficit present.     Mental Status: He is alert and oriented to person, place, and time.  Psychiatric:        Attention and Perception: Attention and perception normal.        Mood and Affect: Mood is anxious. Affect is tearful.        Speech: Speech normal.        Behavior: Behavior is cooperative.        Thought Content: Thought content normal.        Cognition and Memory: Cognition and memory normal.        Judgment: Judgment normal.     Review of Systems  Constitutional: Negative for activity change and appetite change.  HENT: Negative for rhinorrhea and sore throat.   Eyes: Negative for photophobia and visual disturbance.  Respiratory: Negative for cough and shortness of breath.   Cardiovascular: Negative for chest pain and palpitations.  Gastrointestinal: Negative for constipation, diarrhea, nausea and vomiting.  Endocrine: Negative for cold intolerance and heat intolerance.  Genitourinary: Negative for difficulty urinating and dysuria.  Musculoskeletal: Negative for arthralgias and myalgias.  Skin: Negative for rash and wound.  Allergic/Immunologic: Negative for food allergies and immunocompromised state.   Neurological: Negative for dizziness and headaches.  Hematological: Negative for adenopathy. Does not bruise/bleed easily.  Psychiatric/Behavioral: Negative for behavioral problems, hallucinations and suicidal ideas.    Blood pressure (!) 142/84, pulse 62, temperature 98.7 F (37.1 C), temperature source Oral, resp. rate 16, height 5\' 8"  (1.727 m), weight 104.3 kg, SpO2 97 %.Body mass index is 34.96 kg/m.  General Appearance: Disheveled  Eye Contact:  Fair  Speech:  repetative  Volume:  Normal  Mood:  Anxious  Affect:  Congruent, anxious  Thought Process:    Orientation:  Alert, ox2  Thought Content:  Rumination on watching Wheel of Fortune and Jeopardy on TV.  Anxious that "somebody is going to embarrass me"  Suicidal Thoughts:  No, denies  Homicidal Thoughts:  No  Memory:  Immediate;   Fair Recent;   Fair Remote;   Fair  Judgement:  Impaired  Insight:  Shallow  Psychomotor Activity: wnl  Concentration:  Concentration: Fair and Attention Span: Fair  Recall:  [employer] of Knowledge:  Fair  Language:  Fair  Akathisia:  No  Handed:  Right  AIMS (if indicated):     Assets:  Communication Skills Desire for Improvement Resilience Social Support  ADL's:  Intact  Cognition:  Impaired,  Mild  Sleep:  Number of Hours: 8.75     Treatment Plan Summary: Daily contact with patient to assess and evaluate symptoms and progress in treatment and Medication management   Patient is a 64 year old male with the above-stated past psychiatric history who is seen in follow-up.  Chart reviewed. Patient discussed with nursing. Pt anxious, Patient appears stable, likely at his mental baseline. He is awaiting placement. Referrals sent by SW.   cont current psych meds.  Plan:  -continue inpatient psych admission; 15-minute checks; daily contact with patient to assess and evaluate symptoms and progress in treatment; psychoeducation.  -continue scheduled medications without changes today. Marland Kitchen  aspirin EC  81 mg Oral Daily  . clonazePAM  1 mg Oral QID  . divalproex  1,500 mg Oral QHS  . gabapentin  100 mg Oral TID  . influenza vac split quadrivalent PF  0.5 mL Intramuscular Tomorrow-1000  . lamoTRIgine  100 mg Oral QHS  . linagliptin  5 mg Oral Daily  . loratadine  10 mg Oral Daily  . metFORMIN  850 mg Oral BID WC  . metoprolol succinate  50 mg Oral Daily  . oxybutynin  10 mg Oral BID  . pantoprazole  40 mg Oral Daily  . prazosin  1 mg Oral QHS  . QUEtiapine  100 mg Oral QID  . QUEtiapine  400 mg Oral QHS  . sertraline  50 mg Oral BID  . simethicone  80 mg Oral TID  . simvastatin  20 mg Oral q1800  . tamsulosin  0.8 mg Oral QPC supper   -continue PRN medications. acetaminophen, alum & mag hydroxide-simeth, hydrOXYzine, loperamide, LORazepam, magnesium hydroxide, ondansetron  -Disposition: to be determined.  Continuing to search for placement.  Salley Scarlet, MD 05/19/2020, 12:13 PMPatient ID: Linna Darner, male   DOB: May 18, 1956, 64 y.o.   MRN: [id number]

## 2020-05-19 NOTE — Progress Notes (Signed)
At breakfast time patient started crying and stormed out of the day room states " I cannot do this anymore.I need some help. " Staff tried to calm down patient and instructed to stay in bed for sometime.Ativan 2mg  given.Patient rested for an hour.Patient states " I feel better now." Patient had breakfast and joined the group outside.

## 2020-05-19 NOTE — Progress Notes (Signed)
Patientcalm and pleasant this evening denying SI/HI/AVH.Patient observed interacting appropriately with staff and peers on the unit.Patient given support and encouragement. Patient compliant with medication administration per MD orders. Patient being monitored Q 15 minutes for safety per unit protocol. Pt remains safe on the unit.

## 2020-05-19 NOTE — BHH Group Notes (Signed)
LCSW Group Therapy Note  05/19/2020 3:15 PM  Type of Therapy/Topic:  Group Therapy:  Feelings about Diagnosis  Participation Level:  Active   Description of Group:   This group will allow patients to explore their thoughts and feelings about diagnoses they have received. Patients will be guided to explore their level of understanding and acceptance of these diagnoses. Facilitator will encourage patients to process their thoughts and feelings about the reactions of others to their diagnosis and will guide patients in identifying ways to discuss their diagnosis with significant others in their lives. This group will be process-oriented, with patients participating in exploration of their own experiences, giving and receiving support, and processing challenge from other group members.   Therapeutic Goals: 1. Patient will demonstrate understanding of diagnosis as evidenced by identifying two or more symptoms of the disorder 2. Patient will be able to express two feelings regarding the diagnosis 3. Patient will demonstrate their ability to communicate their needs through discussion and/or role play  Summary of Patient Progress: Patient was present in group.  Patient engaged in group discussions.  Patient did get off subhect and had to be redirected back to subject.  Patient was able to engage in group discussion with others and was supportive. Patient shared that he goes to his room to deescalate and lays down and prays.    Therapeutic Modalities:   Cognitive Behavioral Therapy Brief Therapy Feelings Identification   Assunta Curtis, MSW, LCSW 05/19/2020 3:15 PM

## 2020-05-19 NOTE — BHH Group Notes (Signed)
CSW spoke with Scot Jun [phone].  He reports that he thinks that the patient might would work for his home but he would like to meet with the patient again.  He reports that he would like to meet 05/21/2020 at 2:30PM.  Assunta Curtis, MSW, LCSW 05/19/2020 3:47 PM

## 2020-05-19 NOTE — BHH Counselor (Signed)
CSW called [place] to follow up regarding potential placement. CSW was informed that Alexine was currently in a conference call and asked to call back later. CSW left name for follow up. CSW will continue to monitor/follow.   Chalmers Guest. [name], MSW, LCSW, [place] 05/19/2020 3:29 PM

## 2020-05-19 NOTE — Progress Notes (Signed)
Recreation Therapy Notes   Date: 04/18/2020  Time: 9:30 am   Location: Craft room   Behavioral response: Appropriate  Intervention Topic: Relaxation    Discussion/Intervention:  Group content today was focused on relaxation. The group defined relaxation and identified healthy ways to relax. Individuals expressed how much time they spend relaxing. Patients expressed how much their life would be if they did not make time for themselves to relax. The group stated ways they could improve their relaxation techniques in the future.  Individuals participated in the intervention "Time to Relax" where they had a chance to experience different relaxation techniques. Clinical Observations/Feedback: Patient came to group late and relaxed while socializing with peers and staff. He explained that watching his television shows help him relax.  Zeth Buday LRT/CTRS         Makinley Muscato 05/19/2020 11:23 AM

## 2020-05-19 NOTE — Plan of Care (Signed)
Patient presents at his baseline  Problem: Education: Goal: Emotional status will improve Outcome: Not Progressing Goal: Mental status will improve Outcome: Not Progressing   

## 2020-05-19 NOTE — Plan of Care (Signed)
Patient appropriate with staff & peers.No panic attack issues noted later today.

## 2020-05-20 NOTE — BHH Counselor (Signed)
CSW called the [employer] [phone].  CSW spoke with Royal.  She reports that the pt's care coordinator is [employer], [phone].  She reports that Mr. McCalister was assigned today to be the patient's care coordinator.  She reports that the state funded group home list is managed by Lorene Dy, [phone].   Note was added to group home list 9/20 for moderate.  Royal reports that "once a home become open then they can check to see if patient is still interested".  She reports that the providers "send provider send notification to [place] and from there Laqueta Linden goes through the waitlist to see who is available for that particular home".    Assunta Curtis, MSW, LCSW 05/20/2020 4:09 PM

## 2020-05-20 NOTE — Plan of Care (Signed)
Patient had one panic attack episode while doing laundry patient could not open the container.Patient stormed out states " I can not do this anymore." Assisted patient to his room.Reassurance and encouragement given.Patient is preoccupied and worried about not able to spend time with nursing students and instructor.Patient seems relaxed at this time.Compliant with medications.Denies SI,HI and AVH.Appetite and energy level good.

## 2020-05-20 NOTE — BHH Counselor (Signed)
CSW called to follow up on information that was previously called:    Kathlee Nations [phone] from With a [place] [facility], "[facility]", [phone] CSW left HIPAA compliant voicemail.    [address], Pate-Director, [phone]), [phone])  No male beds available in ALF.  Was provided the contact information for [facility], [phone] and [employer], [phone].   [place], [place], [place]   CSW spoke with [place] who reports that he has not reviewed the paperwork, however he call once he has reviewed.   [facility], [phone] Voicemail box is full and unable to accept any messages.    [facility] Blight, [phone] No longer have bed available.        Dellis Anes, [phone] CSW left HIPAA compliant voicemail.      Terri Skains, [phone] CSW unable to leave message as voicemail box is full.    [facility], [phone], Gloris Ham reports that the home will not be able to "meet their needs".  Patient is declined at this time.    [facility], [phone] CSW left HIPAA compliant voicemail  [place], [place] CSW called multiple times, someone answered but as CSW began to identify herself, the person began to speak nonsensical statement and CSW terminated the call.    [facility] Jarred, [phone] Two male beds available.  However, working with a Education officer, museum who is working to fill the beds. She reports that she will follow up that social worker and then call this CSW back.   [place] [facility], [phone], QP-Mr. [facility] [place] [facility], [phone], Dr. Julien Girt [phone] Will return call this evening after paperwork has been reviewed.   Assunta Curtis, MSW, LCSW 05/20/2020 10:39 AM

## 2020-05-20 NOTE — BHH Counselor (Signed)
CSW spoke with [name], [facility] [place], [phone], to check on any updates in regards to placement.  She reports that she has been denied mostly due to concerns for the number of psychiatric medications the patient takes.  She reports that at this time she has no additional leads on placement.    She asked that new FL2 be sent to her.  Assunta Curtis, MSW, LCSW 05/20/2020 12:03 PM

## 2020-05-20 NOTE — BHH Counselor (Signed)
CSW phoned Alexine at [facility] [phone]) regarding potential placement and referral information sent over. Unable to make contact. HIPPA complaint voicemail left with contact information for follow up.  Chalmers Guest. [name], MSW, [place], [place] 05/20/2020 3:11 PM

## 2020-05-20 NOTE — NC FL2 (Signed)
[place] LEVEL OF CARE SCREENING TOOL     IDENTIFICATION  Patient Name: Alexander Beard Birthdate: 07/07/1956 Sex: male Admission Date (Current Location): 02/19/2020  [facility] and [place] Number:  Engineering geologist and Address:  [facility], [address] [place], [place] [address]      Provider Number: [id number]  Attending Physician Name and Address:  Salley Scarlet, MD  Relative Name and Phone Number:       Current Level of Care: Hospital Recommended Level of Care: [place], Other (Comment), [place], Memory Care Prior Approval Number:    Date Approved/Denied:   PASRR Number:    Discharge Plan: Other (Comment) ([place], Assisted Living, Group Home, Memory Care)    Current Diagnoses: Patient Active Problem List   Diagnosis Date Noted  . Dementia ([name]) 04/16/2020  . Intellectual disability 03/06/2020  . At risk for elopement 03/06/2020  . Diabetes ([place]) 04/10/2015  . Hypertension 04/10/2015  . Prostate hypertrophy 04/10/2015  . Schizoaffective disorder ([place]) 04/10/2015  . Enlarged prostate 04/10/2015  . Essential (primary) hypertension 04/10/2015  . Type 2 diabetes mellitus ([place]) 04/10/2015  . Controlled type 2 diabetes mellitus without complication ([place]) 68/34/1962  . Schizoaffective disorder, bipolar type ([place]) 12/29/2014  . Active autistic disorder 12/29/2014    Orientation RESPIRATION BLADDER Height & Weight     Self, Situation, Place, Time  Normal Incontinent (Incontinence at times.) Weight: 229 lb 15 oz (104.3 kg) Height:  5\' 8"  (172.7 cm)  BEHAVIORAL SYMPTOMS/MOOD NEUROLOGICAL BOWEL NUTRITION STATUS  [place]  (NA) Continent Diet (carb modified)  AMBULATORY STATUS COMMUNICATION OF NEEDS Skin   Independent Verbally Normal                       Personal Care Assistance Level of Assistance   (NA)           Functional Limitations Info   (NA)          SPECIAL  CARE FACTORS FREQUENCY  Blood pressure (Diabetic, blood sugars maintained)                    Contractures Contractures Info: Not present    Additional Factors Info  Allergies, Code Status Code Status Info: Fu;; Allergies Info: penicillins, cortizone, "myacins"           Current Medications (05/20/2020):  This is the current hospital active medication list Current Facility-Administered Medications  Medication Dose Route Frequency Provider Last Rate Last Admin  . acetaminophen (TYLENOL) tablet 650 mg  650 mg Oral Q6H PRN [name], MD   650 mg at 05/14/20 2122  . alum & mag hydroxide-simeth (MAALOX/MYLANTA) 200-200-20 MG/5ML suspension 30 mL  30 mL Oral Q4H PRN [name], MD      . aspirin EC tablet 81 mg  81 mg Oral Daily [name], [name], MD   81 mg at 05/20/20 0813  . clonazePAM (KLONOPIN) tablet 1 mg  1 mg Oral QID [name], [name], MD   1 mg at 05/20/20 0700  . divalproex (DEPAKOTE ER) 24 hr tablet 1,500 mg  1,500 mg Oral QHS [name], [name], MD   1,500 mg at 05/19/20 2131  . gabapentin (NEURONTIN) capsule 100 mg  100 mg Oral TID [name], [name], MD   100 mg at 05/20/20 0813  . hydrOXYzine (ATARAX/VISTARIL) tablet 10 mg  10 mg Oral TID PRN [name], MD   10 mg at 05/13/20  1519  . influenza vac split quadrivalent PF (FLUARIX) injection 0.5 mL  0.5 mL Intramuscular Tomorrow-1000 [name], [name], MD      . lamoTRIgine (LAMICTAL) tablet 100 mg  100 mg Oral QHS [name], [name], MD   100 mg at 05/19/20 2131  . linagliptin (TRADJENTA) tablet 5 mg  5 mg Oral Daily [name], [name], MD   5 mg at 05/20/20 0813  . loperamide (IMODIUM) capsule 2 mg  2 mg Oral Q4H PRN [name], MD   2 mg at 04/10/20 0917  . loratadine (CLARITIN) tablet 10 mg  10 mg Oral Daily [name], [name], MD   10 mg at 05/20/20 0813  . LORazepam (ATIVAN) tablet 2 mg  2 mg Oral Q6H PRN Salley Scarlet, MD   2 mg at 05/20/20 2202  . magnesium hydroxide (MILK OF MAGNESIA) suspension 30 mL  30 mL  Oral Daily PRN [name], MD   30 mL at 03/23/20 1344  . metFORMIN (GLUCOPHAGE) tablet 850 mg  850 mg Oral BID WC [name], [name], MD   850 mg at 05/20/20 0814  . metoprolol succinate (TOPROL-XL) 24 hr tablet 50 mg  50 mg Oral Daily [name], [name], MD   50 mg at 05/20/20 0813  . ondansetron (ZOFRAN) tablet 4 mg  4 mg Oral Q8H PRN [name], [name], MD   4 mg at 04/30/20 2035  . oxybutynin (DITROPAN) tablet 10 mg  10 mg Oral BID [name], [name], MD   10 mg at 05/20/20 0814  . pantoprazole (PROTONIX) EC tablet 40 mg  40 mg Oral Daily [name], [name], MD   40 mg at 05/20/20 0813  . prazosin (MINIPRESS) capsule 1 mg  1 mg Oral QHS [name], [name], MD   1 mg at 05/15/20 2111  . QUEtiapine (SEROQUEL) tablet 100 mg  100 mg Oral QID [name], [name], MD   100 mg at 05/20/20 0813  . QUEtiapine (SEROQUEL) tablet 400 mg  400 mg Oral QHS [name], NP   400 mg at 05/19/20 2131  . sertraline (ZOLOFT) tablet 50 mg  50 mg Oral BID [name], [name], MD   50 mg at 05/20/20 0813  . simethicone (MYLICON) chewable tablet 80 mg  80 mg Oral TID [name], [name], MD   80 mg at 05/20/20 0813  . simvastatin (ZOCOR) tablet 20 mg  20 mg Oral q1800 [name], [name], MD   20 mg at 05/19/20 1655  . tamsulosin (FLOMAX) capsule 0.8 mg  0.8 mg Oral QPC supper [name], [name], MD   0.8 mg at 05/19/20 1653     Discharge Medications: Please see discharge summary for a list of discharge medications.  Relevant Imaging Results:  Relevant Lab Results:   Additional Information    [name], LCSW

## 2020-05-20 NOTE — Plan of Care (Signed)
°  Problem: Coping: Goal: Ability to verbalize frustrations and anger appropriately will improve Outcome: Progressing Goal: Ability to demonstrate self-control will improve Outcome: Progressing   Problem: Physical Regulation: Goal: Ability to maintain clinical measurements within normal limits will improve Outcome: Progressing   Problem: Safety: Goal: Periods of time without injury will increase Outcome: Progressing

## 2020-05-20 NOTE — [facility] Group Notes (Signed)
LCSW Group Therapy Note  05/20/2020 2:08 PM  Type of Therapy/Topic:  Group Therapy:  Emotion Regulation  Participation Level:  Did Not Attend   Description of Group:   The purpose of this group is to assist patients in learning to regulate negative emotions and experience positive emotions. Patients will be guided to discuss ways in which they have been vulnerable to their negative emotions. These vulnerabilities will be juxtaposed with experiences of positive emotions or situations, and patients will be challenged to use positive emotions to combat negative ones. Special emphasis will be placed on coping with negative emotions in conflict situations, and patients will process healthy conflict resolution skills.  Therapeutic Goals: 1. Patient will identify two positive emotions or experiences to reflect on in order to balance out negative emotions 2. Patient will label two or more emotions that they find the most difficult to experience 3. Patient will demonstrate positive conflict resolution skills through discussion and/or role plays  Summary of Patient Progress: X  Therapeutic Modalities:   Cognitive Behavioral Therapy Feelings Identification Dialectical Behavioral Therapy  Alexander Beard R. [name], MSW, [place], [place] 05/20/2020 2:08 PM

## 2020-05-20 NOTE — Progress Notes (Signed)
[facility] MD Progress Note  05/20/2020 3:45 PM Alexander Beard  MRN:  [id number]  Principal Problem: Dementia [facility]) Diagnosis: Principal Problem:   Dementia ([place]) Active Problems:   Schizoaffective disorder, bipolar type ([place])   Active autistic disorder   Diabetes ([place])   Hypertension   Prostate hypertrophy   Intellectual disability   At risk for elopement  Alexander Beard is a 64 y.o. male pt that has a previous psychiatric history of Schizoaffective disorder and cognitive impairment who presents to the [facility] unit for treatment of psychosis.  Interval History Patient was seen today for re-evaluation.  Nursing reports no acute overnight events. The patient reports no issues with performing ADLs.  Patient has been medication compliant.  The patient reports no side effects from medications.    SUBJECTIVE:  Patient seen one-on-one this morning. Today he is very upset again about people thinking that another patient's artwork is better than his. He continues to fixate on watching Wheel of Fortune and Jeopardy tonight on TV. Reassurance provided to patient that all art is unique and beautiful.  He continues to focus on leaving the hospital,  He denies suicidal or homicidal thoughts, denies any hallucinations at the time of the interview. He denies side effects from his current medications.No current physical complaints. .   Total Time spent with patient: 30 minutes  Past Psychiatric History: see H&P   Past Medical History:  Past Medical History:  Diagnosis Date  . Anxiety   . Anxiety disorder due to known physiological condition    HOSPITALIZED 10/18  . Arthritis   . Autistic disorder, residual state   . COPD (chronic obstructive pulmonary disease) ([place])   . Depression   . Developmental disorder   . Diabetes mellitus without complication ([place])   . Dyslipidemia   . Esophageal reflux   . HOH (hard of hearing)    MILDLY  . Hypertension   . Obesity   . Overactive bladder   . Palpitations     ANXIETY  . Schizophrenia, schizoaffective ([place])   . Sleep apnea   . Tremors of nervous system    HANDS DUE TO MEDICATIONS  . Urinary incontinence     Past Surgical History:  Procedure Laterality Date  . CATARACT EXTRACTION W/PHACO Left 11/14/2017   Procedure: CATARACT EXTRACTION PHACO AND INTRAOCULAR LENS PLACEMENT (IOC);  Surgeon: Birder Robson, MD;  Location: [facility] ORS;  Service: Ophthalmology;  Laterality: Left;  [place] 00:42 AP% 14.6 CDE 6.12 Fluid pack lot # [id number]  . COLONOSCOPY  01/28/2005  . COLONOSCOPY WITH PROPOFOL N/A 05/09/2016   Procedure: COLONOSCOPY WITH PROPOFOL;  Surgeon: Manya Silvas, MD;  Location: [facility] ENDOSCOPY;  Service: Endoscopy;  Laterality: N/A;  . ESOPHAGOGASTRODUODENOSCOPY N/A 05/09/2016   Procedure: ESOPHAGOGASTRODUODENOSCOPY (EGD);  Surgeon: Manya Silvas, MD;  Location: [facility] ENDOSCOPY;  Service: Endoscopy;  Laterality: N/A;  . FRACTURE SURGERY     ORIF SHOULDER  . TONSILLECTOMY     Family History:  Family History  Problem Relation Age of Onset  . Hypertension Mother   . Stroke Father   . Heart Problems Father    Family Psychiatric  History: see H&P Social History   Substance and Sexual Activity  Alcohol Use No  . Alcohol/week: 0.0 standard drinks     Social History   Substance and Sexual Activity  Drug Use No    Social History   Socioeconomic History  . Marital status: Single    Spouse name: Not on file  . Number of children: Not  on file  . Years of education: Not on file  . Highest education level: Not on file  Occupational History  . Not on file  Tobacco Use  . Smoking status: Never Smoker  . Smokeless tobacco: Never Used  Vaping Use  . Vaping Use: Never used  Substance and Sexual Activity  . Alcohol use: No    Alcohol/week: 0.0 standard drinks  . Drug use: No  . Sexual activity: Never  Other Topics Concern  . Not on file  Social History Narrative   The patient never finished high school but did get his GED. He  works in the past as a Museum/gallery conservator. He has never been married and has no children. He is currently in disability and [name] is his legal guardian. He has been living in group homes for many years.      No pending legal charges   Social Determinants of Health   Financial Resource Strain:   . Difficulty of Paying Living Expenses: Not on file  Food Insecurity:   . Worried About Charity fundraiser in the Last Year: Not on file  . Ran Out of Food in the Last Year: Not on file  Transportation Needs:   . Lack of Transportation (Medical): Not on file  . Lack of Transportation (Non-Medical): Not on file  Physical Activity:   . Days of Exercise per Week: Not on file  . Minutes of Exercise per Session: Not on file  Stress:   . Feeling of Stress : Not on file  Social Connections:   . Frequency of Communication with Friends and Family: Not on file  . Frequency of Social Gatherings with Friends and Family: Not on file  . Attends Religious Services: Not on file  . Active Member of Clubs or Organizations: Not on file  . Attends Archivist Meetings: Not on file  . Marital Status: Not on file   Additional Social History:       Sleep: Poor  Appetite:  Fair  Current Medications: Current Facility-Administered Medications  Medication Dose Route Frequency Provider Last Rate Last Admin  . acetaminophen (TYLENOL) tablet 650 mg  650 mg Oral Q6H PRN [name], MD   650 mg at 05/14/20 2122  . alum & mag hydroxide-simeth (MAALOX/MYLANTA) 200-200-20 MG/5ML suspension 30 mL  30 mL Oral Q4H PRN [name], MD      . aspirin EC tablet 81 mg  81 mg Oral Daily [name], [name], MD   81 mg at 05/20/20 0813  . clonazePAM (KLONOPIN) tablet 1 mg  1 mg Oral QID [name], [name], MD   1 mg at 05/20/20 1236  . divalproex (DEPAKOTE ER) 24 hr tablet 1,500 mg  1,500 mg Oral QHS [name], [name], MD   1,500 mg at 05/19/20 2131  . gabapentin (NEURONTIN) capsule 100 mg  100  mg Oral TID [name], [name], MD   100 mg at 05/20/20 1236  . hydrOXYzine (ATARAX/VISTARIL) tablet 10 mg  10 mg Oral TID PRN [name], MD   10 mg at 05/13/20 1519  . influenza vac split quadrivalent PF (FLUARIX) injection 0.5 mL  0.5 mL Intramuscular Tomorrow-1000 [name], [name], MD      . lamoTRIgine (LAMICTAL) tablet 100 mg  100 mg Oral QHS [name], [name], MD   100 mg at 05/19/20 2131  . linagliptin (TRADJENTA) tablet 5 mg  5 mg Oral Daily [name], [name], MD   5 mg at  05/20/20 0813  . loperamide (IMODIUM) capsule 2 mg  2 mg Oral Q4H PRN [name], MD   2 mg at 04/10/20 0917  . loratadine (CLARITIN) tablet 10 mg  10 mg Oral Daily [name], [name], MD   10 mg at 05/20/20 0813  . LORazepam (ATIVAN) tablet 2 mg  2 mg Oral Q6H PRN Salley Scarlet, MD   2 mg at 05/20/20 2202  . magnesium hydroxide (MILK OF MAGNESIA) suspension 30 mL  30 mL Oral Daily PRN [name], MD   30 mL at 03/23/20 1344  . metFORMIN (GLUCOPHAGE) tablet 850 mg  850 mg Oral BID WC [name], [name], MD   850 mg at 05/20/20 0814  . metoprolol succinate (TOPROL-XL) 24 hr tablet 50 mg  50 mg Oral Daily [name], [name], MD   50 mg at 05/20/20 0813  . ondansetron (ZOFRAN) tablet 4 mg  4 mg Oral Q8H PRN [name], [name], MD   4 mg at 04/30/20 2035  . oxybutynin (DITROPAN) tablet 10 mg  10 mg Oral BID [name], [name], MD   10 mg at 05/20/20 0814  . pantoprazole (PROTONIX) EC tablet 40 mg  40 mg Oral Daily [name], [name], MD   40 mg at 05/20/20 0813  . prazosin (MINIPRESS) capsule 1 mg  1 mg Oral QHS [name], [name], MD   1 mg at 05/15/20 2111  . QUEtiapine (SEROQUEL) tablet 100 mg  100 mg Oral QID [name], [name], MD   100 mg at 05/20/20 1236  . QUEtiapine (SEROQUEL) tablet 400 mg  400 mg Oral QHS [name], NP   400 mg at 05/19/20 2131  . sertraline (ZOLOFT) tablet 50 mg  50 mg Oral BID [name], [name], MD   50 mg at 05/20/20 0813  . simethicone (MYLICON) chewable tablet 80 mg  80 mg Oral TID [name], [name],  MD   80 mg at 05/20/20 1236  . simvastatin (ZOCOR) tablet 20 mg  20 mg Oral q1800 [name], [name], MD   20 mg at 05/19/20 1655  . tamsulosin (FLOMAX) capsule 0.8 mg  0.8 mg Oral QPC supper [name], [name], MD   0.8 mg at 05/19/20 1653    Lab Results:  No results found for this or any previous visit (from the past 48 hour(s)).  Blood Alcohol level:  Lab Results  Component Value Date   ETH <10 01/22/2020   ETH <10 54/27/0623    Metabolic Disorder Labs: Lab Results  Component Value Date   HGBA1C 6.6 (H) 02/20/2020   MPG 142.72 02/20/2020   No results found for: PROLACTIN Lab Results  Component Value Date   CHOL 153 04/10/2015   TRIG 166 (H) 04/10/2015   HDL 50 04/10/2015   CHOLHDL 3.1 04/10/2015   VLDL 33 04/10/2015   LDLCALC 70 04/10/2015    Physical Findings: AIMS: Facial and Oral Movements Muscles of Facial Expression: None, normal Lips and Perioral Area: None, normal Jaw: None, normal Tongue: None, normal,Extremity Movements Upper (arms, wrists, hands, fingers): None, normal Lower (legs, knees, ankles, toes): None, normal, Trunk Movements Neck, shoulders, hips: None, normal, Overall Severity Severity of abnormal movements (highest score from questions above): None, normal Incapacitation due to abnormal movements: None, normal Patient's awareness of abnormal movements (rate only patient's report): No Awareness, Dental Status Current problems with teeth and/or dentures?: No Does patient usually wear dentures?: No  CIWA:    COWS:     Musculoskeletal: Strength & Muscle  Tone: within normal limits Gait & Station: normal Patient leans: N/A  Psychiatric Specialty Exam: Physical Exam Vitals and nursing note reviewed.  Constitutional:      Appearance: Normal appearance.  HENT:     Head: Normocephalic and atraumatic.     Right Ear: External ear normal.     Mouth/Throat:     Mouth: Mucous membranes are moist.  Eyes:     Conjunctiva/sclera: Conjunctivae normal.   Pulmonary:     Effort: Pulmonary effort is normal.  Musculoskeletal:        General: No swelling. Normal range of motion.     Cervical back: Normal range of motion.  Skin:    General: Skin is warm and dry.  Neurological:     General: No focal deficit present.     Mental Status: He is alert and oriented to person, place, and time.  Psychiatric:        Attention and Perception: Attention and perception normal.        Mood and Affect: Mood is anxious. Affect is tearful.        Speech: Speech normal.        Behavior: Behavior is cooperative.        Thought Content: Thought content normal.        Cognition and Memory: Cognition and memory normal.        Judgment: Judgment normal.     Review of Systems  Constitutional: Negative for activity change and appetite change.  HENT: Negative for rhinorrhea and sore throat.   Eyes: Negative for photophobia and visual disturbance.  Respiratory: Negative for cough and shortness of breath.   Cardiovascular: Negative for chest pain and palpitations.  Gastrointestinal: Negative for constipation, diarrhea, nausea and vomiting.  Endocrine: Negative for cold intolerance and heat intolerance.  Genitourinary: Negative for difficulty urinating and dysuria.  Musculoskeletal: Negative for arthralgias and myalgias.  Skin: Negative for rash and wound.  Allergic/Immunologic: Negative for food allergies and immunocompromised state.  Neurological: Negative for dizziness and headaches.  Hematological: Negative for adenopathy. Does not bruise/bleed easily.  Psychiatric/Behavioral: Positive for agitation. Negative for behavioral problems, hallucinations and suicidal ideas. The patient is nervous/anxious.     Blood pressure (!) 143/95, pulse 64, temperature 98.7 F (37.1 C), temperature source Oral, resp. rate 18, height 5\' 8"  (1.727 m), weight 104.3 kg, SpO2 100 %.Body mass index is 34.96 kg/m.  General Appearance: Disheveled  Eye Contact:  Fair  Speech:   repetative  Volume:  Normal  Mood:  Anxious  Affect:  Congruent, anxious, tearful  Thought Process:    Orientation:  Alert, ox2  Thought Content:  Rumination on watching Wheel of Fortune and Jeopardy on TV  Suicidal Thoughts:  No, denies  Homicidal Thoughts:  No  Memory:  Immediate;   Fair Recent;   Fair Remote;   Fair  Judgement:  Impaired  Insight:  Shallow  Psychomotor Activity: wnl  Concentration:  Concentration: Fair and Attention Span: Fair  Recall:  [employer] of Knowledge:  Fair  Language:  Fair  Akathisia:  No  Handed:  Right  AIMS (if indicated):     Assets:  Communication Skills Desire for Improvement Resilience Social Support  ADL's:  Intact  Cognition:  Impaired,  Mild  Sleep:  Number of Hours: 4     Treatment Plan Summary: Daily contact with patient to assess and evaluate symptoms and progress in treatment and Medication management   Patient is a 64 year old male with the above-stated past psychiatric  history who is seen in follow-up.  Chart reviewed. Patient discussed with nursing. Pt anxious,  likely at his mental baseline.He is awaiting placement. Referrals sent by SW. Cont current psych meds.   Plan:  -continue inpatient psych admission; 15-minute checks; daily contact with patient to assess and evaluate symptoms and progress in treatment; psychoeducation.  -continue scheduled medications without changes today. Marland Kitchen aspirin EC  81 mg Oral Daily  . clonazePAM  1 mg Oral QID  . divalproex  1,500 mg Oral QHS  . gabapentin  100 mg Oral TID  . influenza vac split quadrivalent PF  0.5 mL Intramuscular Tomorrow-1000  . lamoTRIgine  100 mg Oral QHS  . linagliptin  5 mg Oral Daily  . loratadine  10 mg Oral Daily  . metFORMIN  850 mg Oral BID WC  . metoprolol succinate  50 mg Oral Daily  . oxybutynin  10 mg Oral BID  . pantoprazole  40 mg Oral Daily  . prazosin  1 mg Oral QHS  . QUEtiapine  100 mg Oral QID  . QUEtiapine  400 mg Oral QHS  . sertraline  50  mg Oral BID  . simethicone  80 mg Oral TID  . simvastatin  20 mg Oral q1800  . tamsulosin  0.8 mg Oral QPC supper   -continue PRN medications. acetaminophen, alum & mag hydroxide-simeth, hydrOXYzine, loperamide, LORazepam, magnesium hydroxide, ondansetron  -Disposition: to be determined.  Continuing to search for placement.  Salley Scarlet, MD 05/20/2020, 3:45 PMPatient ID: Alexander Beard, male   DOB: 29-Jan-1956, 63 y.o.   MRN: [id number]

## 2020-05-20 NOTE — Progress Notes (Signed)
Recreation Therapy Notes  Date: 05/20/2020  Time: 9:30 am   Location: Craft room  Behavioral response: Appropriate  Intervention Topic: Leisure   Discussion/Intervention:  Group content today was focused on leisure. The group defined what leisure is and some positive leisure activities they participate in. Individuals identified the difference between good and bad leisure. Participants expressed how they feel after participating in the leisure of their choice. The group discussed how they go about picking a leisure activity and if others are involved in their leisure activities. The patient stated how many leisure activities they have to choose from and reasons why it is important to have leisure time. Individuals participated in the intervention "Exploration of Leisure" where they had a chance to identify new leisure activities as well as benefits of leisure.  Clinical Observations/Feedback: Patient came to group and was focused on the topic at hand. Individual was social with peers and staff while participating in the intervention.  Chrisopher Pustejovsky LRT/CTRS         Gayathri Futrell 05/20/2020 1:10 PM

## 2020-05-21 NOTE — Plan of Care (Signed)
D- Patient alert and oriented. Patient presented tearful, and in an anxious mood on assessment. Although he denies depression and anxiety on his self-inventory, when asked about how he was feeling, patient stated to this writer "you wouldn't understand, Alexander Beard will explain". Patient also denied SI, HI, AVH, and pain at this time. Patient's goal for today is "emotional control, don't worry, don't get upset, don't let things hurt you, stay peaceful", in which "art work, rest, be busy, stay in control, stay calm", will help him achieve his goal. Patient has had two outbursts today, in which the most recent happened during his interview with a potential Group Home. Patient is fixated on living with his brother and when he doesn't hear what he wants, he throws up his arms and runs away. Patient ran out into the hallway, where he had to be calmed down by staff. Patient does not understand that his brother is not physically able to take care of him anymore. Patient is now working quietly, in the day, writing in his journal.  A- Scheduled medications administered to patient, per MD orders. Support and encouragement provided.  Routine safety checks conducted every 15 minutes.  Patient informed to notify staff with problems or concerns.  R- No adverse drug reactions noted. Patient contracts for safety at this time. Patient compliant with medications and treatment plan. Patient receptive, calm, and cooperative. Patient interacts well with others on the unit.  Patient remains safe at this time.  Problem: Education: Goal: Knowledge of [facility] Education information/materials will improve Outcome: Not Progressing Goal: Emotional status will improve Outcome: Not Progressing Goal: Mental status will improve Outcome: Not Progressing Goal: Verbalization of understanding the information provided will improve Outcome: Not Progressing   Problem: Activity: Goal: Interest or engagement in activities will  improve Outcome: Not Progressing Goal: Sleeping patterns will improve Outcome: Not Progressing   Problem: Coping: Goal: Ability to verbalize frustrations and anger appropriately will improve Outcome: Not Progressing Goal: Ability to demonstrate self-control will improve Outcome: Not Progressing   Problem: Health Behavior/Discharge Planning: Goal: Identification of resources available to assist in meeting health care needs will improve Outcome: Not Progressing Goal: Compliance with treatment plan for underlying cause of condition will improve Outcome: Not Progressing   Problem: Physical Regulation: Goal: Ability to maintain clinical measurements within normal limits will improve Outcome: Not Progressing   Problem: Safety: Goal: Periods of time without injury will increase Outcome: Not Progressing   Problem: Education: Goal: Ability to make informed decisions regarding treatment will improve Outcome: Not Progressing   Problem: Coping: Goal: Coping ability will improve Outcome: Not Progressing   Problem: Health Behavior/Discharge Planning: Goal: Identification of resources available to assist in meeting health care needs will improve Outcome: Not Progressing   Problem: Medication: Goal: Compliance with prescribed medication regimen will improve Outcome: Not Progressing   Problem: Self-Concept: Goal: Ability to disclose and discuss suicidal ideas will improve Outcome: Not Progressing Goal: Will verbalize positive feelings about self Outcome: Not Progressing   Problem: Education: Goal: Knowledge of  General Education information/materials will improve Outcome: Not Progressing Goal: Emotional status will improve Outcome: Not Progressing Goal: Mental status will improve Outcome: Not Progressing Goal: Verbalization of understanding the information provided will improve Outcome: Not Progressing   Problem: Activity: Goal: Interest or engagement in activities  will improve Outcome: Not Progressing Goal: Sleeping patterns will improve Outcome: Not Progressing   Problem: Coping: Goal: Ability to verbalize frustrations and anger appropriately will improve Outcome: Not Progressing Goal: Ability  to demonstrate self-control will improve Outcome: Not Progressing   Problem: Health Behavior/Discharge Planning: Goal: Identification of resources available to assist in meeting health care needs will improve Outcome: Not Progressing Goal: Compliance with treatment plan for underlying cause of condition will improve Outcome: Not Progressing   Problem: Physical Regulation: Goal: Ability to maintain clinical measurements within normal limits will improve Outcome: Not Progressing   Problem: Safety: Goal: Periods of time without injury will increase Outcome: Not Progressing   Problem: Education: Goal: Knowledge of [facility] Education information/materials will improve Outcome: Not Progressing Goal: Emotional status will improve Outcome: Not Progressing Goal: Mental status will improve Outcome: Not Progressing Goal: Verbalization of understanding the information provided will improve Outcome: Not Progressing   Problem: Activity: Goal: Interest or engagement in activities will improve Outcome: Not Progressing Goal: Sleeping patterns will improve Outcome: Not Progressing   Problem: Coping: Goal: Ability to verbalize frustrations and anger appropriately will improve Outcome: Not Progressing Goal: Ability to demonstrate self-control will improve Outcome: Not Progressing   Problem: Health Behavior/Discharge Planning: Goal: Identification of resources available to assist in meeting health care needs will improve Outcome: Not Progressing Goal: Compliance with treatment plan for underlying cause of condition will improve Outcome: Not Progressing   Problem: Physical Regulation: Goal: Ability to maintain clinical measurements within  normal limits will improve Outcome: Not Progressing   Problem: Safety: Goal: Periods of time without injury will increase Outcome: Not Progressing   Problem: Education: Goal: Knowledge of [place] [facility] Education information/materials will improve Outcome: Not Progressing Goal: Emotional status will improve Outcome: Not Progressing Goal: Mental status will improve Outcome: Not Progressing Goal: Verbalization of understanding the information provided will improve Outcome: Not Progressing   Problem: Activity: Goal: Interest or engagement in activities will improve Outcome: Not Progressing Goal: Sleeping patterns will improve Outcome: Not Progressing   Problem: Coping: Goal: Ability to verbalize frustrations and anger appropriately will improve Outcome: Not Progressing Goal: Ability to demonstrate self-control will improve Outcome: Not Progressing   Problem: Health Behavior/Discharge Planning: Goal: Identification of resources available to assist in meeting health care needs will improve Outcome: Not Progressing Goal: Compliance with treatment plan for underlying cause of condition will improve Outcome: Not Progressing   Problem: Physical Regulation: Goal: Ability to maintain clinical measurements within normal limits will improve Outcome: Not Progressing   Problem: Safety: Goal: Periods of time without injury will increase Outcome: Not Progressing

## 2020-05-21 NOTE — BHH Group Notes (Signed)
LCSW Group Therapy Note  05/21/2020 1:54 PM  Type of Therapy/Topic:  Group Therapy:  Balance in Life  Participation Level:  Active  Description of Group:    This group will address the concept of balance and how it feels and looks when one is unbalanced. Patients will be encouraged to process areas in their lives that are out of balance and identify reasons for remaining unbalanced. Facilitators will guide patients in utilizing problem-solving interventions to address and correct the stressor making their life unbalanced. Understanding and applying boundaries will be explored and addressed for obtaining and maintaining a balanced life. Patients will be encouraged to explore ways to assertively make their unbalanced needs known to significant others in their lives, using other group members and facilitator for support and feedback.  Therapeutic Goals: 1. Patient will identify two or more emotions or situations they have that consume much of in their lives. 2. Patient will identify signs/triggers that life has become out of balance:  3. Patient will identify two ways to set boundaries in order to achieve balance in their lives:  4. Patient will demonstrate ability to communicate their needs through discussion and/or role plays  Summary of Patient Progress: Patient was present in group.  Patient was supportive of other group members.  Patient shared that he feels that he has a decent balance when he is with his brother.  He reports that he thinks doing outside work and exercise is helpful to maintain balance.   Therapeutic Modalities:   Cognitive Behavioral Therapy Solution-Focused Therapy Assertiveness Training  Assunta Curtis MSW, LCSW 05/21/2020 1:54 PM

## 2020-05-21 NOTE — Plan of Care (Signed)
Patient is having frequent panic attacks stating it's from being here and wants to leave  Problem: Education: Goal: Emotional status will improve Outcome: Not Progressing Goal: Mental status will improve Outcome: Not Progressing

## 2020-05-21 NOTE — BHH Counselor (Signed)
CSW spoke with the patient's brother.  Per brother the patient's team with [facility] has been in contact with [facility] and pt is now on the list for state funded group homes "but they have to go by the order of people on the list".     He reports that [employer] has been contacted and he can "call them and tell them to hurry up" but CSW pointed out that this may not be a likely plan.    CSW expressed concerns for patient decompensating while on the unit.  CSW asked if patient could go to brothers home if home health and other assistance was put into place and brother declined stating "even with help I don't think I can take it".  CSW reiterated need for patient to have an appropriate placement.    Brother was unable to produce a plan and call was terminated.  Assunta Curtis, MSW, LCSW 05/21/2020 3:48 PM

## 2020-05-21 NOTE — BHH Counselor (Signed)
CSW attempted to support the patient in compliant an interview for [facility].  Patient appeared to be apprehensive about the interview, however with support from psychiatrist and with this CSW patient was able to begin.    Interview was with Scot Jun.  Mr. Lennette Bihari asked pt if pt would be willing to accept that there are restrictions due to Covid, but that patient would still be allowed to do some activities.  Patient became unwilling to listen to discussion on how his favorit activities could occur but look different.  Patient became upset, threw his hands in the air and ran out of the group room yelling and screaming.    CSW checked in with patient's nurse on the patient and was informed that he was doing well.  Assunta Curtis, MSW, LCSW 05/21/2020 3:30 PM

## 2020-05-21 NOTE — Progress Notes (Signed)
Patient presents anxious and upset this evening. Patient came to writer to discuss his situation and to talk. 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 Reports this is bothering him and is a big problem. Pt noted the artwork that he did that is hanging around the nurses station and reports the other patient is a better drawer. Pt tearful and whispering, reports he lost his voice and will have to not talk or talk quietly until voice comes back.  Writer encouraged patient to continue with his artwork and that it was really good and not to be concerned with his peers artwork. Writer continued to inform patient that is artwork was beautiful. Pt tearful at times during conversation. Writer gave pt night meds which included medication for anxiety.  Pt receptive, medication compliant. Continues to draw in dayroom. Minimal interaction with peers. Denies any SI, HI, AVH. Pt receptive and remains safe on unit with q 15 min checks.

## 2020-05-21 NOTE — BHH Counselor (Addendum)
CSW spoke with Aaron Edelman, the patient's Care Coordinator, [phone].  CSW updated Aaron Edelman that [place] team has contacted all group homes in [place] [address] counties. CSW reported that team has also called to follow up on the group homes that did accept the patient's paperwork. CSW expressed that interest has been limited.   Aaron Edelman recommended that CSW begin contacting [id number] homes to see if they would accept the patient. He reports that these homes are typically part of the [employer] waiver, and "it's just a process of weeding out which homes will take him off just the diagnosis".    He does report that the patient's Medicaid has expired.  He reports that he will call this CSW in early next week to schedule a time to talk to the patient.  Assunta Curtis, MSW, LCSW 05/21/2020 11:02 AM

## 2020-05-21 NOTE — BHH Counselor (Signed)
CSW received a voicemail from [facility], [phone], stating that he was the patient's care coordinator.  Message was left at 9:50AM  CSW was unable to speak with him and left HIPAA compliant voicemail.   Assunta Curtis, MSW, LCSW 05/21/2020 10:09 AM

## 2020-05-21 NOTE — Progress Notes (Signed)
Recreation Therapy Notes  Date: 05/21/2020  Time: 9:30 am   Location: Craft room     Behavioral response: N/A   Intervention Topic: Communication   Discussion/Intervention: Patient did not attend group.   Clinical Observations/Feedback:  Patient did not attend group.   Alexander Beard LRT/CTRS         [name] 05/21/2020 11:00 AM

## 2020-05-21 NOTE — Progress Notes (Signed)
Patientcalm and pleasant this evening denying SI/HI/AVH.Patient observed interacting appropriately with staff and peers on the unit.Patient given support and encouragement. Patient compliant with medication administration per MD orders. Patient being monitored Q 15 minutes for safety per unit protocol. Pt remains safe on the unit.

## 2020-05-21 NOTE — Progress Notes (Signed)
[facility] MD Progress Note  05/21/2020 9:38 AM Alexander Beard  MRN:  [id number]  Principal Problem: Dementia [facility]) Diagnosis: Principal Problem:   Dementia ([place]) Active Problems:   Schizoaffective disorder, bipolar type ([place])   Active autistic disorder   Diabetes ([place])   Hypertension   Prostate hypertrophy   Intellectual disability   At risk for elopement  Alexander Beard is a 64 y.o. male pt that has a previous psychiatric history of Schizoaffective disorder and cognitive impairment who presents to the [facility] unit for treatment of psychosis.  Interval History Patient was seen today for re-evaluation.  Nursing reports panic attack overnight due to other patient hanging up drawings that he feels are better than his. The patient reports no issues with performing ADLs.  Patient has been medication compliant.  The patient reports no side effects from medications.    SUBJECTIVE:  Patient seen one-on-one this morning. Today he is very upset again about people thinking that another patient's artwork is better than his. He is also upset that this patient may go outside with him and the nursing students this afternoon. Nursing staff and provider provided support this morning, and encouraged him to return to his room to do his deep breathing exercises and calm down. He was also informed that he does have an interview with potential group home this afternoon.  He continues to focus on leaving the hospital,  He denies suicidal or homicidal thoughts, denies any hallucinations at the time of the interview. He denies side effects from his current medications.No current physical complaints. .   Total Time spent with patient: 30 minutes  Past Psychiatric History: see H&P   Past Medical History:  Past Medical History:  Diagnosis Date  . Anxiety   . Anxiety disorder due to known physiological condition    HOSPITALIZED 10/18  . Arthritis   . Autistic disorder, residual state   . COPD (chronic obstructive pulmonary  disease) ([place])   . Depression   . Developmental disorder   . Diabetes mellitus without complication ([place])   . Dyslipidemia   . Esophageal reflux   . HOH (hard of hearing)    MILDLY  . Hypertension   . Obesity   . Overactive bladder   . Palpitations    ANXIETY  . Schizophrenia, schizoaffective ([place])   . Sleep apnea   . Tremors of nervous system    HANDS DUE TO MEDICATIONS  . Urinary incontinence     Past Surgical History:  Procedure Laterality Date  . CATARACT EXTRACTION W/PHACO Left 11/14/2017   Procedure: CATARACT EXTRACTION PHACO AND INTRAOCULAR LENS PLACEMENT (IOC);  Surgeon: Birder Robson, MD;  Location: [facility] ORS;  Service: Ophthalmology;  Laterality: Left;  [place] 00:42 AP% 14.6 CDE 6.12 Fluid pack lot # [id number]  . COLONOSCOPY  01/28/2005  . COLONOSCOPY WITH PROPOFOL N/A 05/09/2016   Procedure: COLONOSCOPY WITH PROPOFOL;  Surgeon: Manya Silvas, MD;  Location: [facility] ENDOSCOPY;  Service: Endoscopy;  Laterality: N/A;  . ESOPHAGOGASTRODUODENOSCOPY N/A 05/09/2016   Procedure: ESOPHAGOGASTRODUODENOSCOPY (EGD);  Surgeon: Manya Silvas, MD;  Location: [facility] ENDOSCOPY;  Service: Endoscopy;  Laterality: N/A;  . FRACTURE SURGERY     ORIF SHOULDER  . TONSILLECTOMY     Family History:  Family History  Problem Relation Age of Onset  . Hypertension Mother   . Stroke Father   . Heart Problems Father    Family Psychiatric  History: see H&P Social History   Substance and Sexual Activity  Alcohol Use No  . Alcohol/week:  0.0 standard drinks     Social History   Substance and Sexual Activity  Drug Use No    Social History   Socioeconomic History  . Marital status: Single    Spouse name: Not on file  . Number of children: Not on file  . Years of education: Not on file  . Highest education level: Not on file  Occupational History  . Not on file  Tobacco Use  . Smoking status: Never Smoker  . Smokeless tobacco: Never Used  Vaping Use  . Vaping Use: Never used   Substance and Sexual Activity  . Alcohol use: No    Alcohol/week: 0.0 standard drinks  . Drug use: No  . Sexual activity: Never  Other Topics Concern  . Not on file  Social History Narrative   The patient never finished high school but did get his GED. 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 He is currently in disability and his brother Alexander Beard is his legal guardian. He has been living in group homes for many years.      No pending legal charges   Social Determinants of Health   Financial Resource Strain:   . Difficulty of Paying Living Expenses: Not on file  Food Insecurity:   . Worried About Charity fundraiser in the Last Year: Not on file  . Ran Out of Food in the Last Year: Not on file  Transportation Needs:   . Lack of Transportation (Medical): Not on file  . Lack of Transportation (Non-Medical): Not on file  Physical Activity:   . Days of Exercise per Week: Not on file  . Minutes of Exercise per Session: Not on file  Stress:   . Feeling of Stress : Not on file  Social Connections:   . Frequency of Communication with Friends and Family: Not on file  . Frequency of Social Gatherings with Friends and Family: Not on file  . Attends Religious Services: Not on file  . Active Member of Clubs or Organizations: Not on file  . Attends Archivist Meetings: Not on file  . Marital Status: Not on file   Additional Social History:       Sleep: Poor  Appetite:  Fair  Current Medications: Current Facility-Administered Medications  Medication Dose Route Frequency Provider Last Rate Last Admin  . acetaminophen (TYLENOL) tablet 650 mg  650 mg Oral Q6H PRN [name], MD   650 mg at 05/14/20 2122  . alum & mag hydroxide-simeth (MAALOX/MYLANTA) 200-200-20 MG/5ML suspension 30 mL  30 mL Oral Q4H PRN [name], MD      . aspirin EC tablet 81 mg  81 mg Oral Daily [name], [name], MD   81 mg at 05/21/20  0918  . clonazePAM (KLONOPIN) tablet 1 mg  1 mg Oral QID [name], [name], MD   1 mg at 05/21/20 0919  . divalproex (DEPAKOTE ER) 24 hr tablet 1,500 mg  1,500 mg Oral QHS [name], [name], MD   1,500 mg at 05/20/20 2058  . gabapentin (NEURONTIN) capsule 100 mg  100 mg Oral TID [name], [name], MD   100 mg at 05/21/20 0918  . hydrOXYzine (ATARAX/VISTARIL) tablet 10 mg  10 mg Oral TID PRN [name], MD   10 mg at 05/13/20 1519  . influenza vac split quadrivalent PF (FLUARIX) injection 0.5 mL  0.5 mL Intramuscular Tomorrow-1000 [name], [name], MD      .  lamoTRIgine (LAMICTAL) tablet 100 mg  100 mg Oral QHS [name], [name], MD   100 mg at 05/20/20 2058  . linagliptin (TRADJENTA) tablet 5 mg  5 mg Oral Daily [name], [name], MD   5 mg at 05/21/20 0918  . loperamide (IMODIUM) capsule 2 mg  2 mg Oral Q4H PRN [name], MD   2 mg at 04/10/20 0917  . loratadine (CLARITIN) tablet 10 mg  10 mg Oral Daily [name], [name], MD   10 mg at 05/21/20 0919  . LORazepam (ATIVAN) tablet 2 mg  2 mg Oral Q6H PRN Salley Scarlet, MD   2 mg at 05/21/20 3710  . magnesium hydroxide (MILK OF MAGNESIA) suspension 30 mL  30 mL Oral Daily PRN [name], MD   30 mL at 03/23/20 1344  . metFORMIN (GLUCOPHAGE) tablet 850 mg  850 mg Oral BID WC [name], [name], MD   850 mg at 05/21/20 0919  . metoprolol succinate (TOPROL-XL) 24 hr tablet 50 mg  50 mg Oral Daily [name], [name], MD   50 mg at 05/21/20 0919  . ondansetron (ZOFRAN) tablet 4 mg  4 mg Oral Q8H PRN [name], [name], MD   4 mg at 04/30/20 2035  . oxybutynin (DITROPAN) tablet 10 mg  10 mg Oral BID [name], [name], MD   10 mg at 05/21/20 0919  . pantoprazole (PROTONIX) EC tablet 40 mg  40 mg Oral Daily [name], [name], MD   40 mg at 05/21/20 0919  . prazosin (MINIPRESS) capsule 1 mg  1 mg Oral QHS [name], [name], MD   1 mg at 05/20/20 2059  . QUEtiapine (SEROQUEL) tablet 100 mg  100 mg Oral QID [name], [name], MD   100 mg at 05/21/20 0918  . QUEtiapine  (SEROQUEL) tablet 400 mg  400 mg Oral QHS [name], NP   400 mg at 05/20/20 2058  . sertraline (ZOLOFT) tablet 50 mg  50 mg Oral BID [name], [name], MD   50 mg at 05/21/20 0918  . simethicone (MYLICON) chewable tablet 80 mg  80 mg Oral TID [name], [name], MD   80 mg at 05/21/20 0918  . simvastatin (ZOCOR) tablet 20 mg  20 mg Oral q1800 [name], [name], MD   20 mg at 05/20/20 1650  . tamsulosin (FLOMAX) capsule 0.8 mg  0.8 mg Oral QPC supper [name], [name], MD   0.8 mg at 05/20/20 1649    Lab Results:  No results found for this or any previous visit (from the past 48 hour(s)).  Blood Alcohol level:  Lab Results  Component Value Date   ETH <10 01/22/2020   ETH <10 62/69/4854    Metabolic Disorder Labs: Lab Results  Component Value Date   HGBA1C 6.6 (H) 02/20/2020   MPG 142.72 02/20/2020   No results found for: PROLACTIN Lab Results  Component Value Date   CHOL 153 04/10/2015   TRIG 166 (H) 04/10/2015   HDL 50 04/10/2015   CHOLHDL 3.1 04/10/2015   VLDL 33 04/10/2015   LDLCALC 70 04/10/2015    Physical Findings: AIMS: Facial and Oral Movements Muscles of Facial Expression: None, normal Lips and Perioral Area: None, normal Jaw: None, normal Tongue: None, normal,Extremity Movements Upper (arms, wrists, hands, fingers): None, normal Lower (legs, knees, ankles, toes): None, normal, Trunk Movements Neck, shoulders, hips: None, normal, Overall Severity Severity of abnormal movements (highest score from questions above): None, normal Incapacitation due to abnormal movements:  None, normal Patient's awareness of abnormal movements (rate only patient's report): No Awareness, Dental Status Current problems with teeth and/or dentures?: No Does patient usually wear dentures?: No  CIWA:    COWS:     Musculoskeletal: Strength & Muscle Tone: within normal limits Gait & Station: normal Patient leans: N/A  Psychiatric Specialty Exam: Physical Exam Vitals and nursing  note reviewed.  Constitutional:      Appearance: Normal appearance.  HENT:     Head: Normocephalic and atraumatic.     Right Ear: External ear normal.     Mouth/Throat:     Mouth: Mucous membranes are moist.  Eyes:     Conjunctiva/sclera: Conjunctivae normal.  Pulmonary:     Effort: Pulmonary effort is normal.  Musculoskeletal:        General: No swelling. Normal range of motion.     Cervical back: Normal range of motion.  Skin:    General: Skin is warm and dry.  Neurological:     General: No focal deficit present.     Mental Status: He is alert and oriented to person, place, and time.  Psychiatric:        Attention and Perception: Attention and perception normal.        Mood and Affect: Mood is anxious. Affect is tearful.        Speech: Speech normal.        Behavior: Behavior is cooperative.        Thought Content: Thought content normal.        Cognition and Memory: Cognition and memory normal.        Judgment: Judgment normal.     Review of Systems  Constitutional: Negative for activity change and appetite change.  HENT: Negative for rhinorrhea and sore throat.   Eyes: Negative for photophobia and visual disturbance.  Respiratory: Negative for cough and shortness of breath.   Cardiovascular: Negative for chest pain and palpitations.  Gastrointestinal: Negative for constipation, diarrhea, nausea and vomiting.  Endocrine: Negative for cold intolerance and heat intolerance.  Genitourinary: Negative for difficulty urinating and dysuria.  Musculoskeletal: Negative for arthralgias and myalgias.  Skin: Negative for rash and wound.  Allergic/Immunologic: Negative for food allergies and immunocompromised state.  Neurological: Negative for dizziness and headaches.  Hematological: Negative for adenopathy. Does not bruise/bleed easily.  Psychiatric/Behavioral: Positive for agitation. Negative for behavioral problems, hallucinations and suicidal ideas. The patient is  nervous/anxious.     Blood pressure 110/77, pulse 76, temperature 98.1 F (36.7 C), temperature source Oral, resp. rate 17, height 5\' 8"  (1.727 m), weight 104.3 kg, SpO2 97 %.Body mass index is 34.96 kg/m.  General Appearance: Disheveled  Eye Contact:  Fair  Speech:  repetative  Volume:  Normal  Mood:  Anxious  Affect:  Congruent, anxious, tearful  Thought Process:    Orientation:  Alert, ox2  Thought Content:  Rumination on going outside with nursing students today  Suicidal Thoughts:  No, denies  Homicidal Thoughts:  No  Memory:  Immediate;   Fair Recent;   Fair Remote;   Fair  Judgement:  Impaired  Insight:  Shallow  Psychomotor Activity: wnl  Concentration:  Concentration: Fair and Attention Span: Fair  Recall:  [employer] of Knowledge:  Fair  Language:  Fair  Akathisia:  No  Handed:  Right  AIMS (if indicated):     Assets:  Communication Skills Desire for Improvement Resilience Social Support  ADL's:  Intact  Cognition:  Impaired,  Mild  Sleep:  Number of Hours: 6.3     Treatment Plan Summary: Daily contact with patient to assess and evaluate symptoms and progress in treatment and Medication management   Patient is a 64 year old male with the above-stated past psychiatric history who is seen in follow-up.  Chart reviewed. Patient discussed with nursing. Pt anxious,  likely at his mental baseline.He is awaiting placement. Referrals sent by SW. Cont current psych meds.   Plan:  -continue inpatient psych admission; 15-minute checks; daily contact with patient to assess and evaluate symptoms and progress in treatment; psychoeducation.  -continue scheduled medications without changes today. Marland Kitchen aspirin EC  81 mg Oral Daily  . clonazePAM  1 mg Oral QID  . divalproex  1,500 mg Oral QHS  . gabapentin  100 mg Oral TID  . influenza vac split quadrivalent PF  0.5 mL Intramuscular Tomorrow-1000  . lamoTRIgine  100 mg Oral QHS  . linagliptin  5 mg Oral Daily  .  loratadine  10 mg Oral Daily  . metFORMIN  850 mg Oral BID WC  . metoprolol succinate  50 mg Oral Daily  . oxybutynin  10 mg Oral BID  . pantoprazole  40 mg Oral Daily  . prazosin  1 mg Oral QHS  . QUEtiapine  100 mg Oral QID  . QUEtiapine  400 mg Oral QHS  . sertraline  50 mg Oral BID  . simethicone  80 mg Oral TID  . simvastatin  20 mg Oral q1800  . tamsulosin  0.8 mg Oral QPC supper   -continue PRN medications. acetaminophen, alum & mag hydroxide-simeth, hydrOXYzine, loperamide, LORazepam, magnesium hydroxide, ondansetron  -Disposition: to be determined.  Continuing to search for placement.  Salley Scarlet, MD 05/21/2020, 9:38 AMPatient ID: Linna Darner, male   DOB: [birth year], 64 y.o.   MRN: [id number]

## 2020-05-22 MED ORDER — LAMOTRIGINE 25 MG PO TABS
150.0000 mg | ORAL_TABLET | Freq: Every day | ORAL | Status: DC
  Administered 2020-05-22 – 2020-06-07 (×17): 150 mg via ORAL
  Filled 2020-05-22 (×18): qty 1

## 2020-05-22 NOTE — Progress Notes (Signed)
Recreation Therapy Notes   Date: 05/22/2020  Time: 9:30 am              Location: Craft room  Behavioral response: Appropriate  Intervention Topic: Coping Skills   Discussion/Intervention:  Group content on today was focused on coping skills. The group defined what coping skills are and when they normally use coping skills. Individuals described how they normally cope with thing and the coping skills they normally use. Patients expressed why it is important to cope with things and how not coping with things can affect you. The group participated in the intervention "My coping box" and made coping boxes while adding coping skills they could use in the future to the box. Clinical Observations/Feedback: Patient came to group late and identified his coping skill as writing and drawing. He expressed that participating in his coping skills gives him a sense of accomplishment. Individual was social with peers and staff while participating in the intervention.  Caidence Kaseman LRT/CTRS          Wilhelmena Zea 05/22/2020 12:41 PM

## 2020-05-22 NOTE — Progress Notes (Signed)
[facility] MD Progress Note  05/22/2020 12:07 PM Alexander Beard  MRN:  [id number]  Principal Problem: Schizoaffective disorder, bipolar type ([place]) Diagnosis: Principal Problem:   Schizoaffective disorder, bipolar type ([place]) Active Problems:   Active autistic disorder   Diabetes ([place])   Hypertension   Prostate hypertrophy   Intellectual disability   At risk for elopement  AlexanderBeard is a 64 y.o. male pt that has a previous psychiatric history of Schizoaffective disorder and cognitive impairment who presents to the [facility] unit for treatment of psychosis.  Interval History Patient was seen today for re-evaluation.  Yesterday, Nursing reports no overnight events. The patient reports no issues with performing ADLs.  Patient has been medication compliant.  The patient reports no side effects from medications.    SUBJECTIVE:  Patient seen one-on-one this morning. Placed on conference call with his brother, Alexander Beard, so that both parties could explain that Alexander Beard is unable to live with Alexander Beard. Alexander Beard reminds Alexander Beard that he just had spinal surgery, and is fighting prostate cancer at this time. Alexander Beard also reiterates that he is currentl not capable of driving or taking him to activities he previously enjoyed. Alexander Beard and myself encouraged Alexander Beard to work on compromising on his list of demands. We both reminded Alexander Beard that group homes provide far more freedom than the hospital. Alexander Beard minimally receptive to these rationals. He continues to state he can't live like this, and will have to do something drastic in the hospital if he can't leave. Alexander Beard and I reminded Alexander Beard that acting out will prevent future placements from considering him. After phone call ended I again expressed to [name] that there are appropriate ways to ask questions and advocate for yourself that do not involve walking out of interview, yelling, or having a tantrum. Reminded Alexander Beard that he has previously been able to control his panic attacks well in the  hospital by taking deep breaths, laying down, and praying. Alexander Beard does agree that he is capable of using his coping techniques. He presents me with a drawing he completed today. He also continues to work on Clinical cytogeneticist to [employer]. The denies suicidal or homicidal thoughts, denies any hallucinations at the time of the interview. He denies side effects from his current medications.No current physical complaints. .   Total Time spent with patient: 30 minutes  Past Psychiatric History: see H&P   Past Medical History:  Past Medical History:  Diagnosis Date  . Anxiety   . Anxiety disorder due to known physiological condition    HOSPITALIZED 10/18  . Arthritis   . Autistic disorder, residual state   . COPD (chronic obstructive pulmonary disease) ([place])   . Depression   . Developmental disorder   . Diabetes mellitus without complication ([place])   . Dyslipidemia   . Esophageal reflux   . HOH (hard of hearing)    MILDLY  . Hypertension   . Obesity   . Overactive bladder   . Palpitations    ANXIETY  . Schizophrenia, schizoaffective ([place])   . Sleep apnea   . Tremors of nervous system    HANDS DUE TO MEDICATIONS  . Urinary incontinence     Past Surgical History:  Procedure Laterality Date  . CATARACT EXTRACTION W/PHACO Left 11/14/2017   Procedure: CATARACT EXTRACTION PHACO AND INTRAOCULAR LENS PLACEMENT (IOC);  Surgeon: Birder Robson, MD;  Location: [facility] ORS;  Service: Ophthalmology;  Laterality: Left;  [place] 00:42 AP% 14.6 CDE 6.12 Fluid pack lot # [id number]  . COLONOSCOPY  01/28/2005  .  COLONOSCOPY WITH PROPOFOL N/A 05/09/2016   Procedure: COLONOSCOPY WITH PROPOFOL;  Surgeon: Manya Silvas, MD;  Location: [facility] ENDOSCOPY;  Service: Endoscopy;  Laterality: N/A;  . ESOPHAGOGASTRODUODENOSCOPY N/A 05/09/2016   Procedure: ESOPHAGOGASTRODUODENOSCOPY (EGD);  Surgeon: Manya Silvas, MD;  Location: [facility] ENDOSCOPY;  Service: Endoscopy;  Laterality: N/A;  . FRACTURE SURGERY     ORIF  SHOULDER  . TONSILLECTOMY     Family History:  Family History  Problem Relation Age of Onset  . Hypertension Mother   . Stroke Father   . Heart Problems Father    Family Psychiatric  History: see H&P Social History   Substance and Sexual Activity  Alcohol Use No  . Alcohol/week: 0.0 standard drinks     Social History   Substance and Sexual Activity  Drug Use No    Social History   Socioeconomic History  . Marital status: Single    Spouse name: Not on file  . Number of children: Not on file  . Years of education: Not on file  . Highest education level: Not on file  Occupational History  . Not on file  Tobacco Use  . Smoking status: Never Smoker  . Smokeless tobacco: Never Used  Vaping Use  . Vaping Use: Never used  Substance and Sexual Activity  . Alcohol use: No    Alcohol/week: 0.0 standard drinks  . Drug use: No  . Sexual activity: Never  Other Topics Concern  . Not on file  Social History Narrative   The patient never finished high school but did get his GED. 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 He is currently in disability and his brother Alexander Beard is his legal guardian. He has been living in group homes for many years.      No pending legal charges   Social Determinants of Health   Financial Resource Strain:   . Difficulty of Paying Living Expenses: Not on file  Food Insecurity:   . Worried About Charity fundraiser in the Last Year: Not on file  . Ran Out of Food in the Last Year: Not on file  Transportation Needs:   . Lack of Transportation (Medical): Not on file  . Lack of Transportation (Non-Medical): Not on file  Physical Activity:   . Days of Exercise per Week: Not on file  . Minutes of Exercise per Session: Not on file  Stress:   . Feeling of Stress : Not on file  Social Connections:   . Frequency of Communication with Friends and Family: Not on file  . Frequency of Social  Gatherings with Friends and Family: Not on file  . Attends Religious Services: Not on file  . Active Member of Clubs or Organizations: Not on file  . Attends Archivist Meetings: Not on file  . Marital Status: Not on file   Additional Social History:       Sleep: Poor  Appetite:  Fair  Current Medications: Current Facility-Administered Medications  Medication Dose Route Frequency Provider Last Rate Last Admin  . acetaminophen (TYLENOL) tablet 650 mg  650 mg Oral Q6H PRN [name], MD   650 mg at 05/14/20 2122  . alum & mag hydroxide-simeth (MAALOX/MYLANTA) 200-200-20 MG/5ML suspension 30 mL  30 mL Oral Q4H PRN [name], MD      . aspirin EC tablet 81 mg  81 mg Oral Daily [name], [name], MD   [phone]  mg at 05/22/20 0817  . clonazePAM (KLONOPIN) tablet 1 mg  1 mg Oral QID [name], [name], MD   1 mg at 05/22/20 1149  . divalproex (DEPAKOTE ER) 24 hr tablet 1,500 mg  1,500 mg Oral QHS [name], [name], MD   1,500 mg at 05/21/20 2111  . gabapentin (NEURONTIN) capsule 100 mg  100 mg Oral TID [name], [name], MD   100 mg at 05/22/20 1149  . hydrOXYzine (ATARAX/VISTARIL) tablet 10 mg  10 mg Oral TID PRN [name], MD   10 mg at 05/13/20 1519  . influenza vac split quadrivalent PF (FLUARIX) injection 0.5 mL  0.5 mL Intramuscular Tomorrow-1000 [name], [name], MD      . lamoTRIgine (LAMICTAL) tablet 150 mg  150 mg Oral QHS Salley Scarlet, MD      . linagliptin (TRADJENTA) tablet 5 mg  5 mg Oral Daily [name], [name], MD   5 mg at 05/22/20 0817  . loperamide (IMODIUM) capsule 2 mg  2 mg Oral Q4H PRN [name], MD   2 mg at 04/10/20 0917  . loratadine (CLARITIN) tablet 10 mg  10 mg Oral Daily [name], [name], MD   10 mg at 05/22/20 0818  . LORazepam (ATIVAN) tablet 2 mg  2 mg Oral Q6H PRN Salley Scarlet, MD   2 mg at 05/21/20 1614  . magnesium hydroxide (MILK OF MAGNESIA) suspension 30 mL  30 mL Oral Daily PRN [name], MD   30 mL at 03/23/20 1344  .  metFORMIN (GLUCOPHAGE) tablet 850 mg  850 mg Oral BID WC [name], [name], MD   850 mg at 05/22/20 0819  . metoprolol succinate (TOPROL-XL) 24 hr tablet 50 mg  50 mg Oral Daily [name], [name], MD   50 mg at 05/22/20 0818  . ondansetron (ZOFRAN) tablet 4 mg  4 mg Oral Q8H PRN [name], [name], MD   4 mg at 04/30/20 2035  . oxybutynin (DITROPAN) tablet 10 mg  10 mg Oral BID [name], [name], MD   10 mg at 05/22/20 0818  . pantoprazole (PROTONIX) EC tablet 40 mg  40 mg Oral Daily [name], [name], MD   40 mg at 05/22/20 0817  . prazosin (MINIPRESS) capsule 1 mg  1 mg Oral QHS [name], [name], MD   1 mg at 05/20/20 2059  . QUEtiapine (SEROQUEL) tablet 100 mg  100 mg Oral QID [name], [name], MD   100 mg at 05/22/20 1149  . QUEtiapine (SEROQUEL) tablet 400 mg  400 mg Oral QHS [name], NP   400 mg at 05/21/20 2110  . sertraline (ZOLOFT) tablet 50 mg  50 mg Oral BID [name], [name], MD   50 mg at 05/22/20 0818  . simethicone (MYLICON) chewable tablet 80 mg  80 mg Oral TID [name], [name], MD   80 mg at 05/22/20 1149  . simvastatin (ZOCOR) tablet 20 mg  20 mg Oral q1800 [name], [name], MD   20 mg at 05/21/20 1700  . tamsulosin (FLOMAX) capsule 0.8 mg  0.8 mg Oral QPC supper [name], [name], MD   0.8 mg at 05/21/20 1700    Lab Results:  No results found for this or any previous visit (from the past 48 hour(s)).  Blood Alcohol level:  Lab Results  Component Value Date   [facility] <10 01/22/2020   ETH <10 20/25/4270    Metabolic Disorder Labs: Lab Results  Component Value Date  HGBA1C 6.6 (H) 02/20/2020   MPG 142.72 02/20/2020   No results found for: PROLACTIN Lab Results  Component Value Date   CHOL 153 04/10/2015   TRIG 166 (H) 04/10/2015   HDL 50 04/10/2015   CHOLHDL 3.1 04/10/2015   VLDL 33 04/10/2015   LDLCALC 70 04/10/2015    Physical Findings: AIMS: Facial and Oral Movements Muscles of Facial Expression: None, normal Lips and Perioral Area: None, normal Jaw: None,  normal Tongue: None, normal,Extremity Movements Upper (arms, wrists, hands, fingers): None, normal Lower (legs, knees, ankles, toes): None, normal, Trunk Movements Neck, shoulders, hips: None, normal, Overall Severity Severity of abnormal movements (highest score from questions above): None, normal Incapacitation due to abnormal movements: None, normal Patient's awareness of abnormal movements (rate only patient's report): No Awareness, Dental Status Current problems with teeth and/or dentures?: No Does patient usually wear dentures?: No  CIWA:    COWS:     Musculoskeletal: Strength & Muscle Tone: within normal limits Gait & Station: normal Patient leans: N/A  Psychiatric Specialty Exam: Physical Exam Vitals and nursing note reviewed.  Constitutional:      Appearance: Normal appearance.  HENT:     Head: Normocephalic and atraumatic.     Right Ear: External ear normal.     Mouth/Throat:     Mouth: Mucous membranes are moist.  Eyes:     Conjunctiva/sclera: Conjunctivae normal.  Pulmonary:     Effort: Pulmonary effort is normal.  Musculoskeletal:        General: No swelling. Normal range of motion.     Cervical back: Normal range of motion.  Skin:    General: Skin is warm and dry.  Neurological:     General: No focal deficit present.     Mental Status: He is alert and oriented to person, place, and time.  Psychiatric:        Attention and Perception: Attention and perception normal.        Mood and Affect: Mood is anxious. Affect is tearful.        Speech: Speech normal.        Behavior: Behavior is cooperative.        Thought Content: Thought content normal.        Cognition and Memory: Cognition and memory normal.        Judgment: Judgment normal.     Review of Systems  Constitutional: Negative for activity change and appetite change.  HENT: Negative for rhinorrhea and sore throat.   Eyes: Negative for photophobia and visual disturbance.  Respiratory: Negative for  cough and shortness of breath.   Cardiovascular: Negative for chest pain and palpitations.  Gastrointestinal: Negative for constipation, diarrhea, nausea and vomiting.  Endocrine: Negative for cold intolerance and heat intolerance.  Genitourinary: Negative for difficulty urinating and dysuria.  Musculoskeletal: Negative for arthralgias and myalgias.  Skin: Negative for rash and wound.  Allergic/Immunologic: Negative for food allergies and immunocompromised state.  Neurological: Negative for dizziness and headaches.  Hematological: Negative for adenopathy. Does not bruise/bleed easily.  Psychiatric/Behavioral: Positive for agitation. Negative for behavioral problems, hallucinations and suicidal ideas. The patient is nervous/anxious.     Blood pressure (!) 132/92, pulse (!) 59, temperature 98.2 F (36.8 C), temperature source Oral, resp. rate 17, height 5\' 8"  (1.727 m), weight 104.3 kg, SpO2 100 %.Body mass index is 34.96 kg/m.  General Appearance: Disheveled  Eye Contact:  Fair  Speech:  repetative  Volume:  Normal  Mood:  Anxious  Affect:  Congruent, anxious,  tearful  Thought Process:    Orientation:  Alert, ox2  Thought Content:  Rumination on going outside with nursing students today  Suicidal Thoughts:  No, denies  Homicidal Thoughts:  No  Memory:  Immediate;   Fair Recent;   Fair Remote;   Fair  Judgement:  Impaired  Insight:  Shallow  Psychomotor Activity: wnl  Concentration:  Concentration: Fair and Attention Span: Fair  Recall:  [employer] of Knowledge:  Fair  Language:  Fair  Akathisia:  No  Handed:  Right  AIMS (if indicated):     Assets:  Communication Skills Desire for Improvement Resilience Social Support  ADL's:  Intact  Cognition:  Impaired,  Mild  Sleep:  Number of Hours: 6.5     Treatment Plan Summary: Daily contact with patient to assess and evaluate symptoms and progress in treatment and Medication management   Patient is a 64 year old male with  the above-stated past psychiatric history who is seen in follow-up.  Chart reviewed. Patient discussed with nursing. Pt anxious,  likely at his mental baseline.He is awaiting placement. Referrals sent by SW. Cont current psych meds.   Plan:  -continue inpatient psych admission; 15-minute checks; daily contact with patient to assess and evaluate symptoms and progress in treatment; psychoeducation.  -continue scheduled medications without changes today. Marland Kitchen aspirin EC  81 mg Oral Daily  . clonazePAM  1 mg Oral QID  . divalproex  1,500 mg Oral QHS  . gabapentin  100 mg Oral TID  . influenza vac split quadrivalent PF  0.5 mL Intramuscular Tomorrow-1000  . lamoTRIgine  150 mg Oral QHS  . linagliptin  5 mg Oral Daily  . loratadine  10 mg Oral Daily  . metFORMIN  850 mg Oral BID WC  . metoprolol succinate  50 mg Oral Daily  . oxybutynin  10 mg Oral BID  . pantoprazole  40 mg Oral Daily  . prazosin  1 mg Oral QHS  . QUEtiapine  100 mg Oral QID  . QUEtiapine  400 mg Oral QHS  . sertraline  50 mg Oral BID  . simethicone  80 mg Oral TID  . simvastatin  20 mg Oral q1800  . tamsulosin  0.8 mg Oral QPC supper   -continue PRN medications. acetaminophen, alum & mag hydroxide-simeth, hydrOXYzine, loperamide, LORazepam, magnesium hydroxide, ondansetron  -Disposition: to be determined.  Continuing to search for placement.  Salley Scarlet, MD 05/22/2020, 12:07 PMPatient ID: Linna Darner, male   DOB: 10-08-1955, 64 y.o.   MRN: [id number]

## 2020-05-22 NOTE — Progress Notes (Signed)
D: Pt alert and oriented. Pt rates depression 10/10 and anxiety 10/10. Pt goal: "stay calm, be busy, rest, be involved with my old way of life." Pt reports energy level as normal and concentration as being good. Pt reports sleep last night as being good/fair. Pt did receive medications for sleep and did find them helpful. Pt denies experiencing any pain at this time. Pt denies experiencing any SI/HI, or AVH at this time.   A: Scheduled medications administered to pt, per MD orders. Support and encouragement provided. Frequent verbal contact made. Routine safety checks conducted q15 minutes.   R: No adverse drug reactions noted. Pt verbally contracts for safety at this time. Pt complaint with medications and treatment plan. Pt interacts well with others on the unit. Pt remains safe at this time. Will continue to monitor.

## 2020-05-23 DIAGNOSIS — F0391 Unspecified dementia with behavioral disturbance: Secondary | ICD-10-CM | POA: Diagnosis not present

## 2020-05-23 NOTE — Progress Notes (Signed)
[facility] MD Progress Note  05/23/2020 10:04 AM Alexander Beard  MRN:  [id number]  Principal Problem: Schizoaffective disorder, bipolar type ([place]) Diagnosis: Principal Problem:   Schizoaffective disorder, bipolar type ([place]) Active Problems:   Active autistic disorder   Diabetes ([place])   Hypertension   Prostate hypertrophy   Intellectual disability   At risk for elopement  Mr.Goeken is a 64 y.o. male pt that has a previous psychiatric history of Schizoaffective disorder who presents to the [facility] unit for treatment of psychosis.  Interval History Patient was seen today for re-evaluation.  Nursing reports no events overnight. The patient reports no issues with performing ADLs.  Patient has been medication compliant.  The patient reports no side effects from medications.    SUBJECTIVE:  Patient reports having a good day today. He reports he was upset yesterday after phone call with his brother, reports that his mood today is "pretty good". He is anxious regarding unclearness of his future plan and placement, althoughy denies feeling depressed. He denies suicidal or homicidal thoughts. He denies any hallucinations at the time of the interview. He denies side effects from his current medications. He denies any physical complaints. He did not vocalize and questions or concerns today.    Total Time spent with patient: 15 minutes  Past Psychiatric History: see H&P   Past Medical History:  Past Medical History:  Diagnosis Date  . Anxiety   . Anxiety disorder due to known physiological condition    HOSPITALIZED 10/18  . Arthritis   . Autistic disorder, residual state   . COPD (chronic obstructive pulmonary disease) ([place])   . Depression   . Developmental disorder   . Diabetes mellitus without complication ([place])   . Dyslipidemia   . Esophageal reflux   . HOH (hard of hearing)    MILDLY  . Hypertension   . Obesity   . Overactive bladder   . Palpitations    ANXIETY  . Schizophrenia,  schizoaffective ([place])   . Sleep apnea   . Tremors of nervous system    HANDS DUE TO MEDICATIONS  . Urinary incontinence     Past Surgical History:  Procedure Laterality Date  . CATARACT EXTRACTION W/PHACO Left 11/14/2017   Procedure: CATARACT EXTRACTION PHACO AND INTRAOCULAR LENS PLACEMENT (IOC);  Surgeon: Birder Robson, MD;  Location: [facility] ORS;  Service: Ophthalmology;  Laterality: Left;  [place] 00:42 AP% 14.6 CDE 6.12 Fluid pack lot # [id number]  . COLONOSCOPY  01/28/2005  . COLONOSCOPY WITH PROPOFOL N/A 05/09/2016   Procedure: COLONOSCOPY WITH PROPOFOL;  Surgeon: Manya Silvas, MD;  Location: [facility] ENDOSCOPY;  Service: Endoscopy;  Laterality: N/A;  . ESOPHAGOGASTRODUODENOSCOPY N/A 05/09/2016   Procedure: ESOPHAGOGASTRODUODENOSCOPY (EGD);  Surgeon: Manya Silvas, MD;  Location: [facility] ENDOSCOPY;  Service: Endoscopy;  Laterality: N/A;  . FRACTURE SURGERY     ORIF SHOULDER  . TONSILLECTOMY     Family History:  Family History  Problem Relation Age of Onset  . Hypertension Mother   . Stroke Father   . Heart Problems Father    Family Psychiatric  History: see H&P Social History   Substance and Sexual Activity  Alcohol Use No  . Alcohol/week: 0.0 standard drinks     Social History   Substance and Sexual Activity  Drug Use No    Social History   Socioeconomic History  . Marital status: Single    Spouse name: Not on file  . Number of children: Not on file  . Years of education: Not  on file  . Highest education level: Not on file  Occupational History  . Not on file  Tobacco Use  . Smoking status: Never Smoker  . Smokeless tobacco: Never Used  Vaping Use  . Vaping Use: Never used  Substance and Sexual Activity  . Alcohol use: No    Alcohol/week: 0.0 standard drinks  . Drug use: No  . Sexual activity: Never  Other Topics Concern  . Not on file  Social History Narrative   The patient never finished high school but did get his GED. He works in the past as a  Museum/gallery conservator. He has never been married and has no children. He is currently in disability and [name] is his legal guardian. He has been living in group homes for many years.      No pending legal charges   Social Determinants of Health   Financial Resource Strain:   . Difficulty of Paying Living Expenses: Not on file  Food Insecurity:   . Worried About Charity fundraiser in the Last Year: Not on file  . Ran Out of Food in the Last Year: Not on file  Transportation Needs:   . Lack of Transportation (Medical): Not on file  . Lack of Transportation (Non-Medical): Not on file  Physical Activity:   . Days of Exercise per Week: Not on file  . Minutes of Exercise per Session: Not on file  Stress:   . Feeling of Stress : Not on file  Social Connections:   . Frequency of Communication with Friends and Family: Not on file  . Frequency of Social Gatherings with Friends and Family: Not on file  . Attends Religious Services: Not on file  . Active Member of Clubs or Organizations: Not on file  . Attends Archivist Meetings: Not on file  . Marital Status: Not on file   Additional Social History:                         Sleep: Fair  Appetite:  Fair  Current Medications: Current Facility-Administered Medications  Medication Dose Route Frequency Provider Last Rate Last Admin  . acetaminophen (TYLENOL) tablet 650 mg  650 mg Oral Q6H PRN [name], MD   650 mg at 05/14/20 2122  . alum & mag hydroxide-simeth (MAALOX/MYLANTA) 200-200-20 MG/5ML suspension 30 mL  30 mL Oral Q4H PRN [name], MD      . aspirin EC tablet 81 mg  81 mg Oral Daily [name], [name], MD   81 mg at 05/23/20 0805  . clonazePAM (KLONOPIN) tablet 1 mg  1 mg Oral QID [name], [name], MD   1 mg at 05/23/20 0806  . divalproex (DEPAKOTE ER) 24 hr tablet 1,500 mg  1,500 mg Oral QHS [name], [name], MD   1,500 mg at 05/22/20 2142  . gabapentin (NEURONTIN) capsule 100 mg   100 mg Oral TID [name], [name], MD   100 mg at 05/23/20 0805  . hydrOXYzine (ATARAX/VISTARIL) tablet 10 mg  10 mg Oral TID PRN [name], MD   10 mg at 05/13/20 1519  . influenza vac split quadrivalent PF (FLUARIX) injection 0.5 mL  0.5 mL Intramuscular Tomorrow-1000 [name], [name], MD      . lamoTRIgine (LAMICTAL) tablet 150 mg  150 mg Oral QHS Salley Scarlet, MD   150 mg at 05/22/20 2142  . linagliptin (TRADJENTA) tablet 5 mg  5 mg Oral  Daily [name], [name], MD   5 mg at 05/23/20 0806  . loperamide (IMODIUM) capsule 2 mg  2 mg Oral Q4H PRN [name], MD   2 mg at 04/10/20 0917  . loratadine (CLARITIN) tablet 10 mg  10 mg Oral Daily [name], [name], MD   10 mg at 05/23/20 0806  . LORazepam (ATIVAN) tablet 2 mg  2 mg Oral Q6H PRN Salley Scarlet, MD   2 mg at 05/21/20 1614  . magnesium hydroxide (MILK OF MAGNESIA) suspension 30 mL  30 mL Oral Daily PRN [name], MD   30 mL at 03/23/20 1344  . metFORMIN (GLUCOPHAGE) tablet 850 mg  850 mg Oral BID WC [name], [name], MD   850 mg at 05/23/20 0807  . metoprolol succinate (TOPROL-XL) 24 hr tablet 50 mg  50 mg Oral Daily [name], [name], MD   50 mg at 05/23/20 0805  . ondansetron (ZOFRAN) tablet 4 mg  4 mg Oral Q8H PRN [name], [name], MD   4 mg at 04/30/20 2035  . oxybutynin (DITROPAN) tablet 10 mg  10 mg Oral BID [name], [name], MD   10 mg at 05/23/20 0807  . pantoprazole (PROTONIX) EC tablet 40 mg  40 mg Oral Daily [name], [name], MD   40 mg at 05/23/20 0805  . prazosin (MINIPRESS) capsule 1 mg  1 mg Oral QHS [name], [name], MD   1 mg at 05/22/20 2210  . QUEtiapine (SEROQUEL) tablet 100 mg  100 mg Oral QID [name], [name], MD   100 mg at 05/23/20 0805  . QUEtiapine (SEROQUEL) tablet 400 mg  400 mg Oral QHS [name], NP   400 mg at 05/22/20 2141  . sertraline (ZOLOFT) tablet 50 mg  50 mg Oral BID [name], [name], MD   50 mg at 05/23/20 0805  . simethicone (MYLICON) chewable tablet 80 mg  80 mg Oral TID [name],  [name], MD   80 mg at 05/23/20 0805  . simvastatin (ZOCOR) tablet 20 mg  20 mg Oral q1800 [name], [name], MD   20 mg at 05/22/20 1707  . tamsulosin (FLOMAX) capsule 0.8 mg  0.8 mg Oral QPC supper [name], [name], MD   0.8 mg at 05/22/20 1707    Lab Results:  No results found for this or any previous visit (from the past 48 hour(s)).  Blood Alcohol level:  Lab Results  Component Value Date   ETH <10 01/22/2020   ETH <10 03/50/0938    Metabolic Disorder Labs: Lab Results  Component Value Date   HGBA1C 6.6 (H) 02/20/2020   MPG 142.72 02/20/2020   No results found for: PROLACTIN Lab Results  Component Value Date   CHOL 153 04/10/2015   TRIG 166 (H) 04/10/2015   HDL 50 04/10/2015   CHOLHDL 3.1 04/10/2015   VLDL 33 04/10/2015   LDLCALC 70 04/10/2015    Physical Findings: AIMS: Facial and Oral Movements Muscles of Facial Expression: None, normal Lips and Perioral Area: None, normal Jaw: None, normal Tongue: None, normal,Extremity Movements Upper (arms, wrists, hands, fingers): None, normal Lower (legs, knees, ankles, toes): None, normal, Trunk Movements Neck, shoulders, hips: None, normal, Overall Severity Severity of abnormal movements (highest score from questions above): None, normal Incapacitation due to abnormal movements: None, normal Patient's awareness of abnormal movements (rate only patient's report): No Awareness, Dental Status Current problems with teeth and/or dentures?: No Does patient usually wear dentures?: No  CIWA:  COWS:     Musculoskeletal: Strength & Muscle Tone: within normal limits Gait & Station: normal Patient leans: N/A  Psychiatric Specialty Exam: Physical Exam   Review of Systems   Blood pressure 134/85, pulse 80, temperature (!) 97.4 F (36.3 C), temperature source Oral, resp. rate 18, height 5\' 8"  (1.727 m), weight 104.3 kg, SpO2 100 %.Body mass index is 34.96 kg/m.  General Appearance: Disheveled  Eye Contact:  Fair  Speech:   Slightly pressured  Volume:  Normal  Mood:  Euthymic  Affect:  Constricted  Thought Process:  Coherent  Orientation:  Full (Time, Place, and Person)  Thought Content:  Illogical  Suicidal Thoughts:  No  Homicidal Thoughts:  No  Memory:  NA  Judgement:  Impaired  Insight:  Shallow  Psychomotor Activity: wnl  Concentration:  Concentration: Poor and Attention Span: Poor  Recall:  [employer] of Knowledge:  Fair  Language:  Fair  Akathisia:  No  Handed:  Right  AIMS (if indicated):     Assets:  Communication Skills Desire for Improvement  ADL's:  Intact  Cognition:  Impaired,  Mild  Sleep:  Number of Hours: 7.25     Treatment Plan Summary: Daily contact with patient to assess and evaluate symptoms and progress in treatment and Medication management   Patient is a 64 year old male with the above-stated past psychiatric history who is seen in follow-up.  Chart reviewed. Patient discussed with nursing. Patient appears stable, likely at his mental baseline. He is awaiting placement. Referrals sent by SW.    Plan:  -continue inpatient psych admission; 15-minute checks; daily contact with patient to assess and evaluate symptoms and progress in treatment; psychoeducation.  -continue scheduled medications without changes today. Marland Kitchen aspirin EC  81 mg Oral Daily  . clonazePAM  1 mg Oral QID  . divalproex  1,500 mg Oral QHS  . gabapentin  100 mg Oral TID  . influenza vac split quadrivalent PF  0.5 mL Intramuscular Tomorrow-1000  . lamoTRIgine  150 mg Oral QHS  . linagliptin  5 mg Oral Daily  . loratadine  10 mg Oral Daily  . metFORMIN  850 mg Oral BID WC  . metoprolol succinate  50 mg Oral Daily  . oxybutynin  10 mg Oral BID  . pantoprazole  40 mg Oral Daily  . prazosin  1 mg Oral QHS  . QUEtiapine  100 mg Oral QID  . QUEtiapine  400 mg Oral QHS  . sertraline  50 mg Oral BID  . simethicone  80 mg Oral TID  . simvastatin  20 mg Oral q1800  . tamsulosin  0.8 mg Oral QPC supper    -continue PRN medications. acetaminophen, alum & mag hydroxide-simeth, hydrOXYzine, loperamide, LORazepam, magnesium hydroxide, ondansetron  -Disposition: to be determined.  Continuing to search for placement.  Larita Fife, MD 05/23/2020, 10:04 AM

## 2020-05-23 NOTE — Progress Notes (Signed)
Pt is alert and oriented to person, place, time and situation. Pt is social with peers, drawing in the dayroom, pt has been calm, cooperative, medication compliant, appetite is good, attends groups. Pt denies suicidal and homicidal ideation, denies hallucinations, reports feelings of depression and anxiety. Pt rates them 5/10 on a 0-10 scale, 10 being worst, reports the trigger is not being able to discharge and return to his old life of spending time with his brother Alexander Beard. No distress noted, none reported, will continue to monitor pt per Q15 minute face checks and monitor for safety and progress.

## 2020-05-23 NOTE — Progress Notes (Signed)
Patient has been in day room, working on his drawings. Mood is sad, affect is sad. Reports that he just found out that he cannot go to live with his brother Alexander Beard. Patient remained calm, but sad. He said that one of the nurses on day shift offered to help him practice his interviewing skills for the next time he has an interview to be considered for a group home. He continues to say "I can't stay here much longer" but remained calm. Denies SI, HI and AVH.

## 2020-05-23 NOTE — Tx Team (Signed)
Interdisciplinary Treatment and Diagnostic Plan Update  05/23/2020 Time of Session: 10:30 AM  IMER FOXWORTH MRN: [id number]  Principal Diagnosis: Schizoaffective disorder, bipolar type ([place])  Secondary Diagnoses: Principal Problem:   Schizoaffective disorder, bipolar type ([place]) Active Problems:   Active autistic disorder   Diabetes ([place])   Hypertension   Prostate hypertrophy   Intellectual disability   At risk for elopement   Current Medications:  Current Facility-Administered Medications  Medication Dose Route Frequency Provider Last Rate Last Admin  . acetaminophen (TYLENOL) tablet 650 mg  650 mg Oral Q6H PRN [name], MD   650 mg at 05/14/20 2122  . alum & mag hydroxide-simeth (MAALOX/MYLANTA) 200-200-20 MG/5ML suspension 30 mL  30 mL Oral Q4H PRN [name], MD      . aspirin EC tablet 81 mg  81 mg Oral Daily [name], [name], MD   81 mg at 05/23/20 0805  . clonazePAM (KLONOPIN) tablet 1 mg  1 mg Oral QID [name], [name], MD   1 mg at 05/23/20 0806  . divalproex (DEPAKOTE ER) 24 hr tablet 1,500 mg  1,500 mg Oral QHS [name], [name], MD   1,500 mg at 05/22/20 2142  . gabapentin (NEURONTIN) capsule 100 mg  100 mg Oral TID [name], [name], MD   100 mg at 05/23/20 0805  . hydrOXYzine (ATARAX/VISTARIL) tablet 10 mg  10 mg Oral TID PRN [name], MD   10 mg at 05/13/20 1519  . influenza vac split quadrivalent PF (FLUARIX) injection 0.5 mL  0.5 mL Intramuscular Tomorrow-1000 [name], [name], MD      . lamoTRIgine (LAMICTAL) tablet 150 mg  150 mg Oral QHS Salley Scarlet, MD   150 mg at 05/22/20 2142  . linagliptin (TRADJENTA) tablet 5 mg  5 mg Oral Daily [name], [name], MD   5 mg at 05/23/20 0806  . loperamide (IMODIUM) capsule 2 mg  2 mg Oral Q4H PRN [name], MD   2 mg at 04/10/20 0917  . loratadine (CLARITIN) tablet 10 mg  10 mg Oral Daily [name], [name], MD   10 mg at 05/23/20 0806  . LORazepam (ATIVAN) tablet 2 mg  2 mg Oral Q6H PRN Salley Scarlet, MD    2 mg at 05/21/20 1614  . magnesium hydroxide (MILK OF MAGNESIA) suspension 30 mL  30 mL Oral Daily PRN [name], MD   30 mL at 03/23/20 1344  . metFORMIN (GLUCOPHAGE) tablet 850 mg  850 mg Oral BID WC [name], [name], MD   850 mg at 05/23/20 0807  . metoprolol succinate (TOPROL-XL) 24 hr tablet 50 mg  50 mg Oral Daily [name], [name], MD   50 mg at 05/23/20 0805  . ondansetron (ZOFRAN) tablet 4 mg  4 mg Oral Q8H PRN [name], [name], MD   4 mg at 04/30/20 2035  . oxybutynin (DITROPAN) tablet 10 mg  10 mg Oral BID [name], [name], MD   10 mg at 05/23/20 0807  . pantoprazole (PROTONIX) EC tablet 40 mg  40 mg Oral Daily [name], [name], MD   40 mg at 05/23/20 0805  . prazosin (MINIPRESS) capsule 1 mg  1 mg Oral QHS [name], [name], MD   1 mg at 05/22/20 2210  . QUEtiapine (SEROQUEL) tablet 100 mg  100 mg Oral QID [name], [name], MD   100 mg at 05/23/20 0805  . QUEtiapine (SEROQUEL) tablet 400 mg  400 mg Oral QHS [name], NP  400 mg at 05/22/20 2141  . sertraline (ZOLOFT) tablet 50 mg  50 mg Oral BID [name], [name], MD   50 mg at 05/23/20 0805  . simethicone (MYLICON) chewable tablet 80 mg  80 mg Oral TID [name], [name], MD   80 mg at 05/23/20 0805  . simvastatin (ZOCOR) tablet 20 mg  20 mg Oral q1800 [name], [name], MD   20 mg at 05/22/20 1707  . tamsulosin (FLOMAX) capsule 0.8 mg  0.8 mg Oral QPC supper [name], [name], MD   0.8 mg at 05/22/20 1707   PTA Medications: Medications Prior to Admission  Medication Sig Dispense Refill Last Dose  . acetaminophen (TYLENOL) 650 MG CR tablet Take 650 mg by mouth every 12 (twelve) hours as needed for pain.     Marland Kitchen aspirin EC 81 MG tablet Take 81 mg by mouth daily.     . Calcium Carbonate-Vitamin D3 (CALCIUM 600-D) 600-400 MG-UNIT TABS Take 1 tablet by mouth 2 (two) times daily.     . Cholecalciferol (VITAMIN D-1000 MAX ST) 1000 UNITS tablet Take 1,000 Units by mouth daily.      Marland Kitchen docusate sodium (COLACE) 100 MG capsule Take 100 mg  by mouth daily.      . furosemide (LASIX) 20 MG tablet Take 20 mg by mouth daily.      Marland Kitchen gabapentin (NEURONTIN) 100 MG capsule Take 1 capsule (100 mg total) by mouth 3 (three) times daily. 90 capsule 10   . Insulin Detemir (LEVEMIR FLEXTOUCH) 100 UNIT/ML Pen Inject 15 Units into the skin daily at 10 pm.     . lamoTRIgine (LAMICTAL) 100 MG tablet Take 1 tablet (100 mg total) by mouth at bedtime. 30 tablet 10   . loratadine (CLARITIN) 10 MG tablet Take 1 tablet (10 mg total) by mouth daily. 30 tablet 5   . LORazepam (ATIVAN) 1 MG tablet Take 1 tablet (1 mg total) by mouth 3 (three) times daily. 90 tablet 5   . lurasidone (LATUDA) 40 MG TABS tablet Take 1 tablet (40 mg total) by mouth daily. with food 30 tablet 10   . metoprolol succinate (TOPROL-XL) 50 MG 24 hr tablet Take 50 mg by mouth daily.      . NON FORMULARY cpap device     . oxybutynin (DITROPAN) 5 MG tablet Take 5 mg by mouth daily.     . pantoprazole (PROTONIX) 40 MG tablet Take 40 mg by mouth daily.     . polyethylene glycol (MIRALAX / GLYCOLAX) packet Take 17 g by mouth daily.     . QUEtiapine (SEROQUEL) 100 MG tablet TAKE 1 TABLET  BY MOUTH THREE TIMES A DAY (Patient taking differently: Take 100 mg by mouth 3 (three) times daily. TAKE 1 TABLET  BY MOUTH THREE TIMES A DAY) 90 tablet 11   . QUEtiapine (SEROQUEL) 400 MG tablet Take 1 tablet (400 mg total) by mouth at bedtime. 30 tablet 10   . sertraline (ZOLOFT) 100 MG tablet Take 2 tablets (200 mg total) by mouth daily. (Patient taking differently: Take 200 mg by mouth daily. Take along with two 50 mg tablets (100 mg) for total 300 mg daily) 60 tablet 5   . sertraline (ZOLOFT) 50 MG tablet Take 2 tablets (100 mg total) by mouth daily. (Patient taking differently: Take 100 mg by mouth daily. Take along with two 100 mg tablets (200 mg) for total 300 mg daily) 60 tablet 5   . simethicone (MYLICON) 80 MG chewable tablet  Chew 80-160 mg by mouth 2 (two) times daily as needed for flatulence.      . simvastatin (ZOCOR) 20 MG tablet Take 20 mg by mouth at bedtime.      . sitaGLIPtin (JANUVIA) 100 MG tablet Take 100 mg by mouth daily.     . solifenacin (VESICARE) 5 MG tablet Take 5 mg by mouth daily.     . Starch (HEMORRHOIDAL RE) Place 1 application rectally 4 (four) times daily as needed (for itching).     . tamsulosin (FLOMAX) 0.4 MG CAPS capsule Take 0.4 mg by mouth daily.       Patient Stressors: Health problems Marital or family conflict Medication change or noncompliance Traumatic event  Patient Strengths: Motivation for treatment/growth Religious Affiliation  Treatment Modalities: Medication Management, Group therapy, Case management,  1 to 1 session with clinician, Psychoeducation, Recreational therapy.   Physician Treatment Plan for Primary Diagnosis: Schizoaffective disorder, bipolar type ([place]) Long Term Goal(s): Improvement in symptoms so as ready for discharge Improvement in symptoms so as ready for discharge   Short Term Goals: Ability to verbalize feelings will improve Ability to demonstrate self-control will improve Ability to maintain clinical measurements within normal limits will improve Compliance with prescribed medications will improve  Medication Management: Evaluate patient's response, side effects, and tolerance of medication regimen.  Therapeutic Interventions: 1 to 1 sessions, Unit Group sessions and Medication administration.  Evaluation of Outcomes: Progressing  Physician Treatment Plan for Secondary Diagnosis: Principal Problem:   Schizoaffective disorder, bipolar type ([place]) Active Problems:   Active autistic disorder   Diabetes ([place])   Hypertension   Prostate hypertrophy   Intellectual disability   At risk for elopement  Long Term Goal(s): Improvement in symptoms so as ready for discharge Improvement in symptoms so as ready for discharge   Short Term Goals: Ability to verbalize feelings will improve Ability to demonstrate  self-control will improve Ability to maintain clinical measurements within normal limits will improve Compliance with prescribed medications will improve     Medication Management: Evaluate patient's response, side effects, and tolerance of medication regimen.  Therapeutic Interventions: 1 to 1 sessions, Unit Group sessions and Medication administration.  Evaluation of Outcomes: Progressing   RN Treatment Plan for Primary Diagnosis: Schizoaffective disorder, bipolar type ([place]) Long Term Goal(s): Knowledge of disease and therapeutic regimen to maintain health will improve  Short Term Goals: Ability to demonstrate self-control, Ability to participate in decision making will improve, Ability to verbalize feelings will improve, Ability to identify and develop effective coping behaviors will improve and Compliance with prescribed medications will improve  Medication Management: RN will administer medications as ordered by provider, will assess and evaluate patient's response and provide education to patient for prescribed medication. RN will report any adverse and/or side effects to prescribing provider.  Therapeutic Interventions: 1 on 1 counseling sessions, Psychoeducation, Medication administration, Evaluate responses to treatment, Monitor vital signs and CBGs as ordered, Perform/monitor CIWA, COWS, AIMS and Fall Risk screenings as ordered, Perform wound care treatments as ordered.  Evaluation of Outcomes: Progressing   LCSW Treatment Plan for Primary Diagnosis: Schizoaffective disorder, bipolar type ([place]) Long Term Goal(s): Safe transition to appropriate next level of care at discharge, Engage patient in therapeutic group addressing interpersonal concerns.  Short Term Goals: Engage patient in aftercare planning with referrals and resources, Increase social support, Increase ability to appropriately verbalize feelings, Increase emotional regulation and Increase skills for wellness and  recovery  Therapeutic Interventions: Assess for all discharge needs, 1 to 1 time  with Social worker, Explore available resources and support systems, Assess for adequacy in community support network, Educate family and significant other(s) on suicide prevention, Complete Psychosocial Assessment, Interpersonal group therapy.  Evaluation of Outcomes: Progressing   Progress in Treatment: Attending groups: Yes. Participating in groups: Yes. Taking medication as prescribed: Yes. Toleration medication: Yes. Family/Significant other contact made: Yes, individual(s) contacted:  [place], brother  Patient understands diagnosis: Yes. Discussing patient identified problems/goals with staff: Yes. Medical problems stabilized or resolved: Yes. Denies suicidal/homicidal ideation: Yes. Issues/concerns per patient self-inventory: No. Other: N/A  New problem(s) identified: No, Describe:  None   New Short Term/Long Term Goal(s): Elimination of symptoms of psychosis, medication management for mood stabilization; elimination of SI thoughts; development of comprehensive mental wellness/sobriety plan.Update 03/01/20:No changes at this time. 03/07/20: Update, no changes at this time Update 03/12/2020: No changes at this time.Update 03/17/2020:No changes at this time. Update 03/21/20:No changes at this time. Update 03/26/20: No changes at this time. Update 03/31/2020: No changes at this time.Update 04/04/20: No changes at this time Update 04/09/2020: No changes at this time. Update 04/14/2020: No changes at this time. Update 04/20/2020:No changes at this time. Update: 04/25/2020: No changes at this time. Update 04/30/2020: No changes at this time.Update 05/05/2020: No changes at this time. Update 05/09/20: No changes at this time. Update 05/14/20: No changes at this time.Update 05/19/2020: No changes at this time. Update 05/23/20: No changes at this time  Patient Goals:  Patient stated he would like to go  back to [place] and reconnect with his peer support. Patient also stated that he would like to increase his social support and help his brother.Update 03/07/20,no change Update 03/12/2020: No changes at this time. Update 03/17/2020:No changes at this time.Update 03/21/20: No changes at this time. Update 03/26/20: No changes at this time. Update 03/31/2020: No changes at this time. Update9/25/21:No changes at this time. Update 04/09/2020: No changes at this time. Update 04/14/2020: No changes at this time. Update 04/20/2020:No changes at this time. 04/25/2020: Patient still indicates that he would like to go back to his old ways and life. Patient would like to get back into the community. 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 Update 04/30/2020: No changes at this time.Update 05/05/2020: No changes at this time. Patient maintains that he wants to get back to his old way of life, volunteer in the community, do his artwork, and help his brother. Update 05/09/20: No changes at this time. Update 05/14/20: No changes at this time.  Update 05/19/2020: No changes at this time. Update 05/23/20: No changes at this time   Discharge Plan or Barriers: Patient has an interview with a group home on 03/09/20 at 2:00 PM. Update 03/12/2020: CSW continues to assist the patient in developing a housing plan.Update 03/17/2020:CSW has assisted patient in completing an interview with a group home, however, there has been no word on if the patient has been approved. Patient continues to struggle with his anxiety and nerves. Nurses report that he has increased in bed wetting. Psychiatrist and nurses believe that this may be related to his increased anxiety about finding a home.Update 03/21/20: Patient is still nervous and anxious about finding a new placement. Patient stated he could not stay here too much longer and wanted his life back. Update 03/26/20: CSW continues to look for placement for pt. Group Homes, [facility] are being considered. Update 03/31/2020: Patient continues to have interview with group homes, however, no  success at this time in identifying a home that has accepted him.Update: 04/04/20:No changes at this time. Update 04/09/2020: CSW continues to look for placement for patient. Update 04/14/2020: No changes at this time. CSW continues to look for placement. Update 04/20/2020:CSW continues to assist with placement needs.04/25/2020: CSW continues to look for placement options. Update 04/30/2020: CSW continues to look for placement for the patient. Patient has been declined for 17 SNF's however, there are a quite a few where the referral is pending. CSW has contacted several group homes, however, none have selected the patient. Update 05/05/2020: CSW continues to look for placement for the patient. Patient has been declined at 94 SNF's with quite a few referrals left pending. CSW also sent out referrals for memory care facilities. CSW continues to contact groups homes without any selecting the patient at the moment. Update 05/09/20: CSW will continue to find appropriate placement for patient. Update 05/14/20: CSW will continue to find appropriate placement for patient.  Update 05/19/2020: CSW will continue to assist patient in identifying appropriate placement. Update 05/23/2020: CSW will continue to assist patient in identifying appropriate placement.    Reason for Continuation of Hospitalization: Depression Medication stabilization  Estimated Length of Stay: TBD  Attendees: Patient:  05/23/2020 11:13 AM  Physician: Dr. Danella Sensing, MD 05/23/2020 11:13 AM  Nursing:  05/23/2020 11:13 AM  RN Care Manager: 05/23/2020 11:13 AM  Social Worker: [name], [place] 05/23/2020 11:13 AM  Recreational Therapist:  05/23/2020 11:13 AM  Other:  05/23/2020 11:13 AM  Other:  05/23/2020 11:13 AM  Other: 05/23/2020 11:13 AM    Scribe for Treatment Team: [name], LCSWA 05/23/2020 11:13 AM

## 2020-05-23 NOTE — Plan of Care (Signed)
Problem: Education: Goal: Knowledge of [facility] Education information/materials will improve Outcome: Progressing Goal: Emotional status will improve Outcome: Progressing Goal: Mental status will improve Outcome: Progressing Goal: Verbalization of understanding the information provided will improve Outcome: Progressing   Problem: Activity: Goal: Interest or engagement in activities will improve Outcome: Progressing Goal: Sleeping patterns will improve Outcome: Progressing   Problem: Coping: Goal: Ability to verbalize frustrations and anger appropriately will improve Outcome: Progressing Goal: Ability to demonstrate self-control will improve Outcome: Progressing   Problem: Health Behavior/Discharge Planning: Goal: Identification of resources available to assist in meeting health care needs will improve Outcome: Progressing Goal: Compliance with treatment plan for underlying cause of condition will improve Outcome: Progressing   Problem: Physical Regulation: Goal: Ability to maintain clinical measurements within normal limits will improve Outcome: Progressing   Problem: Safety: Goal: Periods of time without injury will increase Outcome: Progressing   Problem: Education: Goal: Ability to make informed decisions regarding treatment will improve Outcome: Progressing   Problem: Coping: Goal: Coping ability will improve Outcome: Progressing   Problem: Health Behavior/Discharge Planning: Goal: Identification of resources available to assist in meeting health care needs will improve Outcome: Progressing   Problem: Medication: Goal: Compliance with prescribed medication regimen will improve Outcome: Progressing   Problem: Self-Concept: Goal: Ability to disclose and discuss suicidal ideas will improve Outcome: Progressing Goal: Will verbalize positive feelings about self Outcome: Progressing   Problem: Education: Goal: Knowledge of [facility]  Education information/materials will improve Outcome: Progressing Goal: Emotional status will improve Outcome: Progressing Goal: Mental status will improve Outcome: Progressing Goal: Verbalization of understanding the information provided will improve Outcome: Progressing   Problem: Activity: Goal: Interest or engagement in activities will improve Outcome: Progressing Goal: Sleeping patterns will improve Outcome: Progressing   Problem: Coping: Goal: Ability to verbalize frustrations and anger appropriately will improve Outcome: Progressing Goal: Ability to demonstrate self-control will improve Outcome: Progressing   Problem: Health Behavior/Discharge Planning: Goal: Identification of resources available to assist in meeting health care needs will improve Outcome: Progressing Goal: Compliance with treatment plan for underlying cause of condition will improve Outcome: Progressing   Problem: Physical Regulation: Goal: Ability to maintain clinical measurements within normal limits will improve Outcome: Progressing   Problem: Safety: Goal: Periods of time without injury will increase Outcome: Progressing   Problem: Education: Goal: Knowledge of [facility] Education information/materials will improve Outcome: Progressing Goal: Emotional status will improve Outcome: Progressing Goal: Mental status will improve Outcome: Progressing Goal: Verbalization of understanding the information provided will improve Outcome: Progressing   Problem: Activity: Goal: Interest or engagement in activities will improve Outcome: Progressing Goal: Sleeping patterns will improve Outcome: Progressing   Problem: Coping: Goal: Ability to verbalize frustrations and anger appropriately will improve Outcome: Progressing Goal: Ability to demonstrate self-control will improve Outcome: Progressing   Problem: Health Behavior/Discharge Planning: Goal: Identification of resources available  to assist in meeting health care needs will improve Outcome: Progressing Goal: Compliance with treatment plan for underlying cause of condition will improve Outcome: Progressing   Problem: Physical Regulation: Goal: Ability to maintain clinical measurements within normal limits will improve Outcome: Progressing   Problem: Safety: Goal: Periods of time without injury will increase Outcome: Progressing   Problem: Education: Goal: Knowledge of [facility] Education information/materials will improve Outcome: Progressing Goal: Emotional status will improve Outcome: Progressing Goal: Mental status will improve Outcome: Progressing Goal: Verbalization of understanding the information provided will improve Outcome: Progressing   Problem: Activity: Goal: Interest  or engagement in activities will improve Outcome: Progressing Goal: Sleeping patterns will improve Outcome: Progressing   Problem: Coping: Goal: Ability to verbalize frustrations and anger appropriately will improve Outcome: Progressing Goal: Ability to demonstrate self-control will improve Outcome: Progressing   Problem: Health Behavior/Discharge Planning: Goal: Identification of resources available to assist in meeting health care needs will improve Outcome: Progressing Goal: Compliance with treatment plan for underlying cause of condition will improve Outcome: Progressing   Problem: Physical Regulation: Goal: Ability to maintain clinical measurements within normal limits will improve Outcome: Progressing   Problem: Safety: Goal: Periods of time without injury will increase Outcome: Progressing

## 2020-05-23 NOTE — [facility] Group Notes (Signed)
  [facility] LCSW Group Therapy Note  Date/Time:  05/23/2020 1:07 PM- 1:48 PM   Type of Therapy and Topic:  Group Therapy:  Feelings About Hospitalization  Participation Level:  Active   Description of Group This process group involved patients discussing their feelings related to being hospitalized, as well as the benefits they see to being in the hospital.  These feelings and benefits were itemized.  The group then brainstormed specific ways in which they could seek those same benefits when they discharge and return home.  Therapeutic Goals 1. Patient will identify and describe positive and negative feelings related to hospitalization 2. Patient will verbalize benefits of hospitalization to themselves personally 3. Patients will brainstorm together ways they can obtain similar benefits in the outpatient setting, identify barriers to wellness and possible solutions  Summary of Patient Progress: Patient checked into group feeling very concerned and trapped. Patient stated that he needed to talk with CSW about how he was feeling. Patient was able to list taking his medication and learning how to deal with people as a positive. Patient stated the hospital is a snake pit and he feels trapped. Patient stated that he does not get to go outside as much as he wants to. Patient stated the hospital is hurting him and not helping him. Patient stated he wants his freedom back. Patient stated he plans on getting back out in the community and getting back to his old way of life when he is discharged.   Therapeutic Modalities Cognitive Behavioral Therapy Motivational Interviewing    [name], [place] 05/23/2020  2:57 PM

## 2020-05-24 DIAGNOSIS — F0391 Unspecified dementia with behavioral disturbance: Secondary | ICD-10-CM | POA: Diagnosis not present

## 2020-05-24 NOTE — Plan of Care (Signed)
Problem: Education: Goal: Knowledge of [facility] Education information/materials will improve Outcome: Progressing Goal: Emotional status will improve Outcome: Progressing Goal: Mental status will improve Outcome: Progressing Goal: Verbalization of understanding the information provided will improve Outcome: Progressing   Problem: Activity: Goal: Interest or engagement in activities will improve Outcome: Progressing Goal: Sleeping patterns will improve Outcome: Progressing   Problem: Coping: Goal: Ability to verbalize frustrations and anger appropriately will improve Outcome: Progressing Goal: Ability to demonstrate self-control will improve Outcome: Progressing   Problem: Health Behavior/Discharge Planning: Goal: Identification of resources available to assist in meeting health care needs will improve Outcome: Progressing Goal: Compliance with treatment plan for underlying cause of condition will improve Outcome: Progressing   Problem: Physical Regulation: Goal: Ability to maintain clinical measurements within normal limits will improve Outcome: Progressing   Problem: Safety: Goal: Periods of time without injury will increase Outcome: Progressing   Problem: Education: Goal: Ability to make informed decisions regarding treatment will improve Outcome: Progressing   Problem: Coping: Goal: Coping ability will improve Outcome: Progressing   Problem: Health Behavior/Discharge Planning: Goal: Identification of resources available to assist in meeting health care needs will improve Outcome: Progressing   Problem: Medication: Goal: Compliance with prescribed medication regimen will improve Outcome: Progressing   Problem: Self-Concept: Goal: Ability to disclose and discuss suicidal ideas will improve Outcome: Progressing Goal: Will verbalize positive feelings about self Outcome: Progressing   Problem: Education: Goal: Knowledge of [facility]  Education information/materials will improve Outcome: Progressing Goal: Emotional status will improve Outcome: Progressing Goal: Mental status will improve Outcome: Progressing Goal: Verbalization of understanding the information provided will improve Outcome: Progressing   Problem: Activity: Goal: Interest or engagement in activities will improve Outcome: Progressing Goal: Sleeping patterns will improve Outcome: Progressing   Problem: Coping: Goal: Ability to verbalize frustrations and anger appropriately will improve Outcome: Progressing Goal: Ability to demonstrate self-control will improve Outcome: Progressing   Problem: Health Behavior/Discharge Planning: Goal: Identification of resources available to assist in meeting health care needs will improve Outcome: Progressing Goal: Compliance with treatment plan for underlying cause of condition will improve Outcome: Progressing   Problem: Physical Regulation: Goal: Ability to maintain clinical measurements within normal limits will improve Outcome: Progressing   Problem: Safety: Goal: Periods of time without injury will increase Outcome: Progressing   Problem: Education: Goal: Knowledge of [facility] Education information/materials will improve Outcome: Progressing Goal: Emotional status will improve Outcome: Progressing Goal: Mental status will improve Outcome: Progressing Goal: Verbalization of understanding the information provided will improve Outcome: Progressing   Problem: Activity: Goal: Interest or engagement in activities will improve Outcome: Progressing Goal: Sleeping patterns will improve Outcome: Progressing   Problem: Coping: Goal: Ability to verbalize frustrations and anger appropriately will improve Outcome: Progressing Goal: Ability to demonstrate self-control will improve Outcome: Progressing   Problem: Health Behavior/Discharge Planning: Goal: Identification of resources available  to assist in meeting health care needs will improve Outcome: Progressing Goal: Compliance with treatment plan for underlying cause of condition will improve Outcome: Progressing   Problem: Physical Regulation: Goal: Ability to maintain clinical measurements within normal limits will improve Outcome: Progressing   Problem: Safety: Goal: Periods of time without injury will increase Outcome: Progressing   Problem: Education: Goal: Knowledge of [facility] Education information/materials will improve Outcome: Progressing Goal: Emotional status will improve Outcome: Progressing Goal: Mental status will improve Outcome: Progressing Goal: Verbalization of understanding the information provided will improve Outcome: Progressing   Problem: Activity: Goal: Interest  or engagement in activities will improve Outcome: Progressing Goal: Sleeping patterns will improve Outcome: Progressing   Problem: Coping: Goal: Ability to verbalize frustrations and anger appropriately will improve Outcome: Progressing Goal: Ability to demonstrate self-control will improve Outcome: Progressing   Problem: Health Behavior/Discharge Planning: Goal: Identification of resources available to assist in meeting health care needs will improve Outcome: Progressing Goal: Compliance with treatment plan for underlying cause of condition will improve Outcome: Progressing   Problem: Physical Regulation: Goal: Ability to maintain clinical measurements within normal limits will improve Outcome: Progressing   Problem: Safety: Goal: Periods of time without injury will increase Outcome: Progressing

## 2020-05-24 NOTE — Progress Notes (Signed)
[facility] MD Progress Note  05/24/2020 8:59 AM Alexander Beard  MRN:  [id number]  Principal Problem: Schizoaffective disorder, bipolar type ([place]) Diagnosis: Principal Problem:   Schizoaffective disorder, bipolar type ([place]) Active Problems:   Active autistic disorder   Diabetes ([place])   Hypertension   Prostate hypertrophy   Intellectual disability   At risk for elopement  Alexander Beard is a 64 y.o. male pt that has a previous psychiatric history of Schizoaffective disorder who presents to the [facility] unit for treatment of psychosis.  Interval History Patient was seen today for re-evaluation.  Nursing reports no events overnight. The patient reports no issues with performing ADLs.  Patient has been medication compliant.  The patient reports no side effects from medications.    SUBJECTIVE:  Patient reports he feels worried today due to having a religious conversation with another patient last night and realized they have different views; Patient is requesting to see a chaplain today. He reports "okay" mood; denies feeling depressed. He hopes to be rederred to a good group home where he will have enough personal freedom and will be allowed to watch his favorite TV shows and sing a national anthen at a park. He reports anxiety regarding unclearness of his future plan and placement. He denies suicidal or homicidal thoughts. He denies any hallucinations at the time of the interview. He denies side effects from his current medications. He denies any physical complaints.   Total Time spent with patient: 20 minutes  Past Psychiatric History: see H&P   Past Medical History:  Past Medical History:  Diagnosis Date  . Anxiety   . Anxiety disorder due to known physiological condition    HOSPITALIZED 10/18  . Arthritis   . Autistic disorder, residual state   . COPD (chronic obstructive pulmonary disease) ([place])   . Depression   . Developmental disorder   . Diabetes mellitus without complication ([place])   .  Dyslipidemia   . Esophageal reflux   . HOH (hard of hearing)    MILDLY  . Hypertension   . Obesity   . Overactive bladder   . Palpitations    ANXIETY  . Schizophrenia, schizoaffective ([place])   . Sleep apnea   . Tremors of nervous system    HANDS DUE TO MEDICATIONS  . Urinary incontinence     Past Surgical History:  Procedure Laterality Date  . CATARACT EXTRACTION W/PHACO Left 11/14/2017   Procedure: CATARACT EXTRACTION PHACO AND INTRAOCULAR LENS PLACEMENT (IOC);  Surgeon: Birder Robson, MD;  Location: [facility] ORS;  Service: Ophthalmology;  Laterality: Left;  [place] 00:42 AP% 14.6 CDE 6.12 Fluid pack lot # [id number]  . COLONOSCOPY  01/28/2005  . COLONOSCOPY WITH PROPOFOL N/A 05/09/2016   Procedure: COLONOSCOPY WITH PROPOFOL;  Surgeon: Manya Silvas, MD;  Location: [facility] ENDOSCOPY;  Service: Endoscopy;  Laterality: N/A;  . ESOPHAGOGASTRODUODENOSCOPY N/A 05/09/2016   Procedure: ESOPHAGOGASTRODUODENOSCOPY (EGD);  Surgeon: Manya Silvas, MD;  Location: [facility] ENDOSCOPY;  Service: Endoscopy;  Laterality: N/A;  . FRACTURE SURGERY     ORIF SHOULDER  . TONSILLECTOMY     Family History:  Family History  Problem Relation Age of Onset  . Hypertension Mother   . Stroke Father   . Heart Problems Father    Family Psychiatric  History: see H&P Social History   Substance and Sexual Activity  Alcohol Use No  . Alcohol/week: 0.0 standard drinks     Social History   Substance and Sexual Activity  Drug Use No    Social  History   Socioeconomic History  . Marital status: Single    Spouse name: Not on file  . Number of children: Not on file  . Years of education: Not on file  . Highest education level: Not on file  Occupational History  . Not on file  Tobacco Use  . Smoking status: Never Smoker  . Smokeless tobacco: Never Used  Vaping Use  . Vaping Use: Never used  Substance and Sexual Activity  . Alcohol use: No    Alcohol/week: 0.0 standard drinks  . Drug use: No  . Sexual  activity: Never  Other Topics Concern  . Not on file  Social History Narrative   The patient never finished high school but did get his GED. 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 He is currently in disability and [name] is his legal guardian. He has been living in group homes for many years.      No pending legal charges   Social Determinants of Health   Financial Resource Strain:   . Difficulty of Paying Living Expenses: Not on file  Food Insecurity:   . Worried About Charity fundraiser in the Last Year: Not on file  . Ran Out of Food in the Last Year: Not on file  Transportation Needs:   . Lack of Transportation (Medical): Not on file  . Lack of Transportation (Non-Medical): Not on file  Physical Activity:   . Days of Exercise per Week: Not on file  . Minutes of Exercise per Session: Not on file  Stress:   . Feeling of Stress : Not on file  Social Connections:   . Frequency of Communication with Friends and Family: Not on file  . Frequency of Social Gatherings with Friends and Family: Not on file  . Attends Religious Services: Not on file  . Active Member of Clubs or Organizations: Not on file  . Attends Archivist Meetings: Not on file  . Marital Status: Not on file   Additional Social History:                         Sleep: Fair  Appetite:  Fair  Current Medications: Current Facility-Administered Medications  Medication Dose Route Frequency Provider Last Rate Last Admin  . acetaminophen (TYLENOL) tablet 650 mg  650 mg Oral Q6H PRN [name], MD   650 mg at 05/14/20 2122  . alum & mag hydroxide-simeth (MAALOX/MYLANTA) 200-200-20 MG/5ML suspension 30 mL  30 mL Oral Q4H PRN [name], MD      . aspirin EC tablet 81 mg  81 mg Oral Daily [name], [name], MD   81 mg at 05/24/20 0837  . clonazePAM (KLONOPIN) tablet 1 mg  1 mg Oral QID [name], [name], MD   1 mg at  05/24/20 5462  . divalproex (DEPAKOTE ER) 24 hr tablet 1,500 mg  1,500 mg Oral QHS [name], [name], MD   1,500 mg at 05/23/20 2110  . gabapentin (NEURONTIN) capsule 100 mg  100 mg Oral TID [name], [name], MD   100 mg at 05/24/20 7035  . hydrOXYzine (ATARAX/VISTARIL) tablet 10 mg  10 mg Oral TID PRN [name], MD   10 mg at 05/13/20 1519  . influenza vac split quadrivalent PF (FLUARIX) injection 0.5 mL  0.5 mL Intramuscular Tomorrow-1000 [name], [name], MD      .  lamoTRIgine (LAMICTAL) tablet 150 mg  150 mg Oral QHS Salley Scarlet, MD   150 mg at 05/23/20 2111  . linagliptin (TRADJENTA) tablet 5 mg  5 mg Oral Daily [name], [name], MD   5 mg at 05/24/20 9935  . loperamide (IMODIUM) capsule 2 mg  2 mg Oral Q4H PRN [name], MD   2 mg at 04/10/20 0917  . loratadine (CLARITIN) tablet 10 mg  10 mg Oral Daily [name], [name], MD   10 mg at 05/24/20 7017  . LORazepam (ATIVAN) tablet 2 mg  2 mg Oral Q6H PRN Salley Scarlet, MD   2 mg at 05/21/20 1614  . magnesium hydroxide (MILK OF MAGNESIA) suspension 30 mL  30 mL Oral Daily PRN [name], MD   30 mL at 03/23/20 1344  . metFORMIN (GLUCOPHAGE) tablet 850 mg  850 mg Oral BID WC [name], [name], MD   850 mg at 05/24/20 0839  . metoprolol succinate (TOPROL-XL) 24 hr tablet 50 mg  50 mg Oral Daily [name], [name], MD   50 mg at 05/24/20 7939  . ondansetron (ZOFRAN) tablet 4 mg  4 mg Oral Q8H PRN [name], [name], MD   4 mg at 04/30/20 2035  . oxybutynin (DITROPAN) tablet 10 mg  10 mg Oral BID [name], [name], MD   10 mg at 05/24/20 0839  . pantoprazole (PROTONIX) EC tablet 40 mg  40 mg Oral Daily [name], [name], MD   40 mg at 05/24/20 0300  . prazosin (MINIPRESS) capsule 1 mg  1 mg Oral QHS [name], [name], MD   1 mg at 05/23/20 2110  . QUEtiapine (SEROQUEL) tablet 100 mg  100 mg Oral QID [name], [name], MD   100 mg at 05/24/20 0837  . QUEtiapine (SEROQUEL) tablet 400 mg  400 mg Oral QHS [name], NP   400 mg at 05/23/20  2110  . sertraline (ZOLOFT) tablet 50 mg  50 mg Oral BID [name], [name], MD   50 mg at 05/24/20 9233  . simethicone (MYLICON) chewable tablet 80 mg  80 mg Oral TID [name], [name], MD   80 mg at 05/24/20 0837  . simvastatin (ZOCOR) tablet 20 mg  20 mg Oral q1800 [name], [name], MD   20 mg at 05/23/20 1758  . tamsulosin (FLOMAX) capsule 0.8 mg  0.8 mg Oral QPC supper [name], [name], MD   0.8 mg at 05/23/20 1759    Lab Results:  No results found for this or any previous visit (from the past 48 hour(s)).  Blood Alcohol level:  Lab Results  Component Value Date   ETH <10 01/22/2020   ETH <10 00/76/2263    Metabolic Disorder Labs: Lab Results  Component Value Date   HGBA1C 6.6 (H) 02/20/2020   MPG 142.72 02/20/2020   No results found for: PROLACTIN Lab Results  Component Value Date   CHOL 153 04/10/2015   TRIG 166 (H) 04/10/2015   HDL 50 04/10/2015   CHOLHDL 3.1 04/10/2015   VLDL 33 04/10/2015   LDLCALC 70 04/10/2015    Physical Findings: AIMS: Facial and Oral Movements Muscles of Facial Expression: None, normal Lips and Perioral Area: None, normal Jaw: None, normal Tongue: None, normal,Extremity Movements Upper (arms, wrists, hands, fingers): None, normal Lower (legs, knees, ankles, toes): None, normal, Trunk Movements Neck, shoulders, hips: None, normal, Overall Severity Severity of abnormal movements (highest score from questions above): None, normal Incapacitation due to abnormal movements:  None, normal Patient's awareness of abnormal movements (rate only patient's report): No Awareness, Dental Status Current problems with teeth and/or dentures?: No Does patient usually wear dentures?: No  CIWA:    COWS:     Musculoskeletal: Strength & Muscle Tone: within normal limits Gait & Station: normal Patient leans: N/A  Psychiatric Specialty Exam: Physical Exam   Review of Systems   Blood pressure 129/83, pulse (!) 58, temperature 98 F (36.7 C), temperature  source Oral, resp. rate 18, height 5\' 8"  (1.727 m), weight 104.3 kg, SpO2 100 %.Body mass index is 34.96 kg/m.  General Appearance: Disheveled  Eye Contact:  Fair  Speech:  Slightly pressured  Volume:  Normal  Mood:  Euthymic  Affect:  Constricted  Thought Process:  Coherent  Orientation:  Full (Time, Place, and Person)  Thought Content:  Illogical  Suicidal Thoughts:  No  Homicidal Thoughts:  No  Memory:  NA  Judgement:  Impaired  Insight:  Shallow  Psychomotor Activity: wnl  Concentration:  Concentration: Poor and Attention Span: Poor  Recall:  [employer] of Knowledge:  Fair  Language:  Fair  Akathisia:  No  Handed:  Right  AIMS (if indicated):     Assets:  Communication Skills Desire for Improvement  ADL's:  Intact  Cognition:  Impaired,  Mild  Sleep:  Number of Hours: 7.25     Treatment Plan Summary: Daily contact with patient to assess and evaluate symptoms and progress in treatment and Medication management   Patient is a 64 year old male with the above-stated past psychiatric history who is seen in follow-up.  Chart reviewed. Patient discussed with nursing. Patient appears stable, likely at his mental baseline. He is awaiting placement. Referrals sent by SW.    Plan:  -continue inpatient psych admission; 15-minute checks; daily contact with patient to assess and evaluate symptoms and progress in treatment; psychoeducation.  -continue scheduled medications without changes today. Marland Kitchen aspirin EC  81 mg Oral Daily  . clonazePAM  1 mg Oral QID  . divalproex  1,500 mg Oral QHS  . gabapentin  100 mg Oral TID  . influenza vac split quadrivalent PF  0.5 mL Intramuscular Tomorrow-1000  . lamoTRIgine  150 mg Oral QHS  . linagliptin  5 mg Oral Daily  . loratadine  10 mg Oral Daily  . metFORMIN  850 mg Oral BID WC  . metoprolol succinate  50 mg Oral Daily  . oxybutynin  10 mg Oral BID  . pantoprazole  40 mg Oral Daily  . prazosin  1 mg Oral QHS  . QUEtiapine  100 mg  Oral QID  . QUEtiapine  400 mg Oral QHS  . sertraline  50 mg Oral BID  . simethicone  80 mg Oral TID  . simvastatin  20 mg Oral q1800  . tamsulosin  0.8 mg Oral QPC supper   -continue PRN medications. acetaminophen, alum & mag hydroxide-simeth, hydrOXYzine, loperamide, LORazepam, magnesium hydroxide, ondansetron  -Disposition: to be determined.  Continuing to search for placement.  Larita Fife, MD 05/24/2020, 8:59 AM

## 2020-05-24 NOTE — [facility] Group Notes (Signed)
LCSW Group Therapy Note   05/24/2020 1:00 PM- 2:03 PM     Type of Therapy and Topic: Group Therapy: Fears and Unhealthy Coping Skills   Participation Level: Active   Description of Group: The focus of this group was to discuss some of the prevalent fears that patients experience, and to identify the commonalities among group members. An exercise was used to initiate the discussion, followed by writing on the white board a group-generated list of unhealthy coping and healthy coping techniques to deal with each fear.   Therapeutic Goals: 1. Patient will identify and describe 3 fears they experience 2. Patient will identify one positive coping strategy for each fear they experience 3. Patient will respond empathically to peers statements regarding fears they experience   Summary of Patient Progress: Patient checked into group feeling pretty good. 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 Patient has the option of creating a vision board and identifying what his fears are. Patient stated that he is fearful that he will not be able to get out of the hospital. Patient drew pictures of things he would like to do when he is discharged from the hospital. Patient spoke about counseling, therapy, doing charitable work, singing at the baseball field, and taking care of his brother. Patient stated those are the things that make him happy.     Therapeutic Modalities Cognitive Behavioral Therapy Motivational Interviewing    [name], [place] 05/24/2020 3:02 PM

## 2020-05-24 NOTE — Progress Notes (Signed)
Pt is alert and oriented to person, place, time and situation. Pt is calm, cooperative, pleasant, social with peers, attends groups, draws in the dining room, reports he is having a better day today than previous days regarding his mood. Pt is medication compliant, appetite is good, reports he slept well. Pt denies suicidal and homicidal ideation, denies hallucinations, denies feelings of depression and anxiety.

## 2020-05-24 NOTE — Progress Notes (Signed)
Ch visited with Pt in response to OR for prayer. Pt was copying a drawing of a peacock onto a blank paper, with pen. After Ch introduced herself, Pt started by stating that he is upset that his brother cannot drive him to all the special places he would like to go to anymore; the zoo, [place], [place], etc., because his brother had an accident. Ch listened to Pt's concerns. 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 He says that he wants to change the minds of people about him; he says that people do not understand what goes on in his head, and it is unfair. He keeps saying that he wants to write to the [facility] to help people understand about mental health issues, also so he can get his good name back. Ch provided pastoral presence and support. Pt requested prayer at the end of the visit; Ch prayed with him. Pt was grateful for visit.

## 2020-05-24 NOTE — Progress Notes (Signed)
Patient is calm and cooperative. Today he is focused on drawing a picture for a male peer and meticulously wrapping the pens given to the patients to fill out their self-inventories in tape so they will last longer. Medication compliant. Says he has started a new medication that is keeping him calm. Denies SI, HI and AVH

## 2020-05-25 NOTE — Plan of Care (Signed)
  Problem: Anxiety Goal: STG - Patient will identify 3 triggers to anxiety within 5 recreation therapy group sessions Description: STG - Patient will identify 3 triggers to anxiety within 5 recreation therapy group sessions Outcome: Progressing Goal: STG - Patient will identify 3 benefits of managing their anxiety in a healthy manner within 5 recreation therapy group sessions Description: STG - Patient will identify 3 benefits of managing their anxiety in a healthy manner within 5 recreation therapy group sessions Outcome: Progressing Goal: STG - Patient will demonstrate ability to practice at least 2 stress management technique independently post d/c within 5 recreation therapy group sessions Description: STG - Patient will demonstrate ability to practice at least 2 stress management technique independently post d/c within 5 recreation therapy group sessions Outcome: Progressing

## 2020-05-25 NOTE — NC FL2 (Signed)
[place] LEVEL OF CARE SCREENING TOOL     IDENTIFICATION  Patient Name: Alexander Beard Birthdate: [birth year] Sex: male Admission Date (Current Location): 02/19/2020  [facility] and [place] Number:  Engineering geologist and Address:  [facility], [address], [place], [place] [address]      Provider Number: [id number]  Attending Physician Name and Address:  Salley Scarlet, MD  Relative Name and Phone Number:       Current Level of Care: Hospital Recommended Level of Care: [place], Other (Comment), [place], Memory Care Prior Approval Number:    Date Approved/Denied:   PASRR Number:    Discharge Plan: Other (Comment) ([place], Assisted Living, Group Home)    Current Diagnoses: Patient Active Problem List   Diagnosis Date Noted  . Intellectual disability 03/06/2020  . At risk for elopement 03/06/2020  . Diabetes ([place]) 04/10/2015  . Hypertension 04/10/2015  . Prostate hypertrophy 04/10/2015  . Schizoaffective disorder ([place]) 04/10/2015  . Enlarged prostate 04/10/2015  . Essential (primary) hypertension 04/10/2015  . Type 2 diabetes mellitus ([place]) 04/10/2015  . Controlled type 2 diabetes mellitus without complication ([place]) 83/41/9622  . Schizoaffective disorder, bipolar type ([place]) 12/29/2014  . Active autistic disorder 12/29/2014    Orientation RESPIRATION BLADDER Height & Weight     Self, Situation, Place, Time  Normal Incontinent (Incontinence at times, typically triggered by high stress) Weight: 229 lb 15 oz (104.3 kg) Height:  5\' 8"  (172.7 cm)  BEHAVIORAL SYMPTOMS/MOOD NEUROLOGICAL BOWEL NUTRITION STATUS  Wanderer, Verbally abusive  (NA) Continent Diet (carb modified)  AMBULATORY STATUS COMMUNICATION OF NEEDS Skin   Independent Verbally Normal                       Personal Care Assistance Level of Assistance   (NA)           Functional Limitations Info   (NA)           SPECIAL CARE FACTORS FREQUENCY  Blood pressure (Diabetic, blood sugars maintained)                    Contractures Contractures Info: Not present    Additional Factors Info  Allergies, Code Status Code Status Info: Full Allergies Info: penicillins, cortizone, "myacins"           Current Medications (05/25/2020):  This is the current hospital active medication list Current Facility-Administered Medications  Medication Dose Route Frequency Provider Last Rate Last Admin  . acetaminophen (TYLENOL) tablet 650 mg  650 mg Oral Q6H PRN [name], MD   650 mg at 05/14/20 2122  . alum & mag hydroxide-simeth (MAALOX/MYLANTA) 200-200-20 MG/5ML suspension 30 mL  30 mL Oral Q4H PRN [name], MD      . aspirin EC tablet 81 mg  81 mg Oral Daily [name], [name], MD   81 mg at 05/25/20 2979  . clonazePAM (KLONOPIN) tablet 1 mg  1 mg Oral QID [name], [name], MD   1 mg at 05/25/20 1225  . divalproex (DEPAKOTE ER) 24 hr tablet 1,500 mg  1,500 mg Oral QHS [name], [name], MD   1,500 mg at 05/24/20 2129  . gabapentin (NEURONTIN) capsule 100 mg  100 mg Oral TID [name], [name], MD   100 mg at 05/25/20 1225  . hydrOXYzine (ATARAX/VISTARIL) tablet 10 mg  10 mg Oral TID PRN [name], MD   10 mg at 05/13/20  1519  . influenza vac split quadrivalent PF (FLUARIX) injection 0.5 mL  0.5 mL Intramuscular Tomorrow-1000 [name], [name], MD      . lamoTRIgine (LAMICTAL) tablet 150 mg  150 mg Oral QHS Salley Scarlet, MD   150 mg at 05/24/20 2129  . linagliptin (TRADJENTA) tablet 5 mg  5 mg Oral Daily [name], [name], MD   5 mg at 05/25/20 3748  . loperamide (IMODIUM) capsule 2 mg  2 mg Oral Q4H PRN [name], MD   2 mg at 04/10/20 0917  . loratadine (CLARITIN) tablet 10 mg  10 mg Oral Daily [name], [name], MD   10 mg at 05/25/20 2707  . LORazepam (ATIVAN) tablet 2 mg  2 mg Oral Q6H PRN Salley Scarlet, MD   2 mg at 05/21/20 1614  . magnesium hydroxide (MILK OF MAGNESIA)  suspension 30 mL  30 mL Oral Daily PRN [name], MD   30 mL at 03/23/20 1344  . metFORMIN (GLUCOPHAGE) tablet 850 mg  850 mg Oral BID WC [name], [name], MD   850 mg at 05/25/20 0741  . metoprolol succinate (TOPROL-XL) 24 hr tablet 50 mg  50 mg Oral Daily [name], [name], MD   50 mg at 05/25/20 8675  . ondansetron (ZOFRAN) tablet 4 mg  4 mg Oral Q8H PRN [name], [name], MD   4 mg at 04/30/20 2035  . oxybutynin (DITROPAN) tablet 10 mg  10 mg Oral BID [name], [name], MD   10 mg at 05/25/20 4492  . pantoprazole (PROTONIX) EC tablet 40 mg  40 mg Oral Daily [name], [name], MD   40 mg at 05/25/20 0741  . prazosin (MINIPRESS) capsule 1 mg  1 mg Oral QHS [name], [name], MD   1 mg at 05/23/20 2110  . QUEtiapine (SEROQUEL) tablet 100 mg  100 mg Oral QID [name], [name], MD   100 mg at 05/25/20 1225  . QUEtiapine (SEROQUEL) tablet 400 mg  400 mg Oral QHS [name], NP   400 mg at 05/24/20 2128  . sertraline (ZOLOFT) tablet 50 mg  50 mg Oral BID [name], [name], MD   50 mg at 05/25/20 0100  . simethicone (MYLICON) chewable tablet 80 mg  80 mg Oral TID [name], [name], MD   80 mg at 05/25/20 1225  . simvastatin (ZOCOR) tablet 20 mg  20 mg Oral q1800 [name], [name], MD   20 mg at 05/24/20 1739  . tamsulosin (FLOMAX) capsule 0.8 mg  0.8 mg Oral QPC supper [name], [name], MD   0.8 mg at 05/24/20 1739     Discharge Medications: Please see discharge summary for a list of discharge medications.  Relevant Imaging Results:  Relevant Lab Results:   Additional Information    [name], LCSW

## 2020-05-25 NOTE — Plan of Care (Signed)
Patient stated that he had a pretty good day.Patient is happy that he could go outside today.Denies SI,HI and AVH.Compliant with medications.ADLs maintained.Attended groups.Appetite and energy level good.Support and encouragement given.

## 2020-05-25 NOTE — Progress Notes (Signed)
Patient has been calm and cooperative. Denies SI, HI and AVH

## 2020-05-25 NOTE — [facility] Group Notes (Signed)
LCSW Group Therapy Note   05/25/2020 12:38 PM  Type of Therapy and Topic:  Group Therapy:  Overcoming Obstacles   Participation Level:  Active   Description of Group:    In this group patients will be encouraged to explore what they see as obstacles to their own wellness and recovery. They will be guided to discuss their thoughts, feelings, and behaviors related to these obstacles. The group will process together ways to cope with barriers, with attention given to specific choices patients can make. Each patient will be challenged to identify changes they are motivated to make in order to overcome their obstacles. This group will be process-oriented, with patients participating in exploration of their own experiences as well as giving and receiving support and challenge from other group members.   Therapeutic Goals: 1. Patient will identify personal and current obstacles as they relate to admission. 2. Patient will identify barriers that currently interfere with their wellness or overcoming obstacles.  3. Patient will identify feelings, thought process and behaviors related to these barriers. 4. Patient will identify two changes they are willing to make to overcome these obstacles:      Summary of Patient Progress Patient was present in group. Patient was active in group. Patient identified his obstacles as not being able to leave the unit and relying solely on his brother as his mode of transportation.  Patient problem solved that the unit could let him go out into the community and that he could call other relatives for transportation to solve his obstacles.  CSW informed patient that trips from the unit are not allowed, if patient was to go to any of the activities that he identified he would need to be discharged. CSW pointed out that patient's largest obstacle has been housing. Patient shared that he would not be happy "ot able to live" if his life is not returned to normalcy. CSW discussed  with patient that there is a process to getting back to normal and that patient needs to work on patience.  Other group members supported in trying to explain this to the patient.  Patient appeared receptive and reported that he wanted to speak with the psychiatrist.    Therapeutic Modalities:   Cognitive Behavioral Therapy Solution Focused Therapy Motivational Interviewing Relapse Prevention Therapy  Assunta Curtis, MSW, LCSW 05/25/2020 12:38 PM

## 2020-05-25 NOTE — Progress Notes (Signed)
Recreation Therapy Notes   Date: 05/25/2020  Time: 9:30 am   Location: Craft room  Behavioral response: Appropriate  Intervention Topic: Stress Management    Discussion/Intervention:  Group content on today was focused on stress. The group defined stress and way to cope with stress. Participants expressed how they know when they are stresses out. Individuals described the different ways they have to cope with stress. The group stated reasons why it is important to cope with stress. Patient explained what good stress is and some examples. The group participated in the intervention "Stress Management". Individuals were separated into two group and answered questions related to stress.  Clinical Observations/Feedback: Patient came to group late due to unknown reasons. He explained that he sometimes becomes stressed when he is too focused on something and does not take breaks. Individual was social with peers and staff while participating in the intervention.  Perlie Stene LRT/CTRS         Kiyaan Haq 05/25/2020 1:44 PM

## 2020-05-25 NOTE — Plan of Care (Signed)
Problem: Education: Goal: Knowledge of [facility] Education information/materials will improve Outcome: Progressing Goal: Emotional status will improve Outcome: Progressing Goal: Mental status will improve Outcome: Progressing Goal: Verbalization of understanding the information provided will improve Outcome: Progressing   Problem: Activity: Goal: Interest or engagement in activities will improve Outcome: Progressing Goal: Sleeping patterns will improve Outcome: Progressing   Problem: Coping: Goal: Ability to verbalize frustrations and anger appropriately will improve Outcome: Progressing Goal: Ability to demonstrate self-control will improve Outcome: Progressing   Problem: Health Behavior/Discharge Planning: Goal: Identification of resources available to assist in meeting health care needs will improve Outcome: Progressing Goal: Compliance with treatment plan for underlying cause of condition will improve Outcome: Progressing   Problem: Physical Regulation: Goal: Ability to maintain clinical measurements within normal limits will improve Outcome: Progressing   Problem: Safety: Goal: Periods of time without injury will increase Outcome: Progressing   Problem: Education: Goal: Ability to make informed decisions regarding treatment will improve Outcome: Progressing   Problem: Coping: Goal: Coping ability will improve Outcome: Progressing   Problem: Health Behavior/Discharge Planning: Goal: Identification of resources available to assist in meeting health care needs will improve Outcome: Progressing   Problem: Medication: Goal: Compliance with prescribed medication regimen will improve Outcome: Progressing   Problem: Self-Concept: Goal: Ability to disclose and discuss suicidal ideas will improve Outcome: Progressing Goal: Will verbalize positive feelings about self Outcome: Progressing   Problem: Education: Goal: Knowledge of [facility]  Education information/materials will improve Outcome: Progressing Goal: Emotional status will improve Outcome: Progressing Goal: Mental status will improve Outcome: Progressing Goal: Verbalization of understanding the information provided will improve Outcome: Progressing   Problem: Activity: Goal: Interest or engagement in activities will improve Outcome: Progressing Goal: Sleeping patterns will improve Outcome: Progressing   Problem: Coping: Goal: Ability to verbalize frustrations and anger appropriately will improve Outcome: Progressing Goal: Ability to demonstrate self-control will improve Outcome: Progressing   Problem: Health Behavior/Discharge Planning: Goal: Identification of resources available to assist in meeting health care needs will improve Outcome: Progressing Goal: Compliance with treatment plan for underlying cause of condition will improve Outcome: Progressing   Problem: Physical Regulation: Goal: Ability to maintain clinical measurements within normal limits will improve Outcome: Progressing   Problem: Safety: Goal: Periods of time without injury will increase Outcome: Progressing   Problem: Education: Goal: Knowledge of [facility] Education information/materials will improve Outcome: Progressing Goal: Emotional status will improve Outcome: Progressing Goal: Mental status will improve Outcome: Progressing Goal: Verbalization of understanding the information provided will improve Outcome: Progressing   Problem: Activity: Goal: Interest or engagement in activities will improve Outcome: Progressing Goal: Sleeping patterns will improve Outcome: Progressing   Problem: Coping: Goal: Ability to verbalize frustrations and anger appropriately will improve Outcome: Progressing Goal: Ability to demonstrate self-control will improve Outcome: Progressing   Problem: Health Behavior/Discharge Planning: Goal: Identification of resources available  to assist in meeting health care needs will improve Outcome: Progressing Goal: Compliance with treatment plan for underlying cause of condition will improve Outcome: Progressing   Problem: Physical Regulation: Goal: Ability to maintain clinical measurements within normal limits will improve Outcome: Progressing   Problem: Safety: Goal: Periods of time without injury will increase Outcome: Progressing   Problem: Education: Goal: Knowledge of [facility] Education information/materials will improve Outcome: Progressing Goal: Emotional status will improve Outcome: Progressing Goal: Mental status will improve Outcome: Progressing Goal: Verbalization of understanding the information provided will improve Outcome: Progressing   Problem: Activity: Goal: Interest  or engagement in activities will improve Outcome: Progressing Goal: Sleeping patterns will improve Outcome: Progressing   Problem: Coping: Goal: Ability to verbalize frustrations and anger appropriately will improve Outcome: Progressing Goal: Ability to demonstrate self-control will improve Outcome: Progressing   Problem: Health Behavior/Discharge Planning: Goal: Identification of resources available to assist in meeting health care needs will improve Outcome: Progressing Goal: Compliance with treatment plan for underlying cause of condition will improve Outcome: Progressing   Problem: Physical Regulation: Goal: Ability to maintain clinical measurements within normal limits will improve Outcome: Progressing   Problem: Safety: Goal: Periods of time without injury will increase Outcome: Progressing

## 2020-05-25 NOTE — Progress Notes (Signed)
   05/25/20 2000  Psych Admission Type (Psych Patients Only)  Admission Status Involuntary  Psychosocial Assessment  Patient Complaints None  Eye Contact Fair  Facial Expression Anxious  Affect Anxious  Speech Logical/coherent  Interaction Assertive  Motor Activity Slow  Appearance/Hygiene Unremarkable  Behavior Characteristics Cooperative  Mood Pleasant  Thought Process  Coherency WDL  Content WDL  Delusions None reported or observed  Perception WDL  Hallucination None reported or observed  Judgment UTA  Confusion None  Danger to Self  Current suicidal ideation? Denies  Self-Injurious Behavior No self-injurious ideation or behavior indicators observed or expressed   Danger to Others  Danger to Others None reported or observed  D: Patient presents with pleasant affect. Patient appears composed and calm. Patient denies SI/HI at this time. Patient also denies AH/VH at this time.  A: Provided positive reinforcement and encouragement.  R: Patient cooperative and receptive to efforts. Patient remains safe on unit.

## 2020-05-25 NOTE — Progress Notes (Signed)
[facility] MD Progress Note  05/25/2020 11:31 AM NASEER HEARN  MRN:  [id number]  Principal Problem: Schizoaffective disorder, bipolar type ([place]) Diagnosis: Principal Problem:   Schizoaffective disorder, bipolar type ([place]) Active Problems:   Active autistic disorder   Diabetes ([place])   Hypertension   Prostate hypertrophy   Intellectual disability   At risk for elopement  Mr.Brokaw is a 64 y.o. male pt that has a previous psychiatric history of Schizoaffective disorder who presents to the [facility] unit for treatment of psychosis.  Interval History Patient was seen today for re-evaluation.  Nursing reports no events overnight. The patient reports no issues with performing ADLs.  Patient has been medication compliant.  The patient reports no side effects from medications.    SUBJECTIVE:  Patient seen one-on-one this morning. Today he is concerned that he may not be able to go outside today since we are short staffed. He reports "okay" mood; denies feeling depressed.  He reports anxiety regarding unclearness of his future plan and placement. He is currently working on a list of things he wants out of a group home, his Christmas list for [name], and a letter to [facility]. He denies suicidal or homicidal thoughts. He denies any hallucinations at the time of the interview. He denies side effects from his current medications. He denies any physical complaints.   Total Time spent with patient: 30 minutes  Past Psychiatric History: see H&P   Past Medical History:  Past Medical History:  Diagnosis Date  . Anxiety   . Anxiety disorder due to known physiological condition    HOSPITALIZED 10/18  . Arthritis   . Autistic disorder, residual state   . COPD (chronic obstructive pulmonary disease) ([place])   . Depression   . Developmental disorder   . Diabetes mellitus without complication ([place])   . Dyslipidemia   . Esophageal reflux   . HOH (hard of hearing)    MILDLY  . Hypertension   . Obesity   .  Overactive bladder   . Palpitations    ANXIETY  . Schizophrenia, schizoaffective ([place])   . Sleep apnea   . Tremors of nervous system    HANDS DUE TO MEDICATIONS  . Urinary incontinence     Past Surgical History:  Procedure Laterality Date  . CATARACT EXTRACTION W/PHACO Left 11/14/2017   Procedure: CATARACT EXTRACTION PHACO AND INTRAOCULAR LENS PLACEMENT (IOC);  Surgeon: Birder Robson, MD;  Location: [facility] ORS;  Service: Ophthalmology;  Laterality: Left;  [place] 00:42 AP% 14.6 CDE 6.12 Fluid pack lot # [id number]  . COLONOSCOPY  01/28/2005  . COLONOSCOPY WITH PROPOFOL N/A 05/09/2016   Procedure: COLONOSCOPY WITH PROPOFOL;  Surgeon: Manya Silvas, MD;  Location: [facility] ENDOSCOPY;  Service: Endoscopy;  Laterality: N/A;  . ESOPHAGOGASTRODUODENOSCOPY N/A 05/09/2016   Procedure: ESOPHAGOGASTRODUODENOSCOPY (EGD);  Surgeon: Manya Silvas, MD;  Location: [facility] ENDOSCOPY;  Service: Endoscopy;  Laterality: N/A;  . FRACTURE SURGERY     ORIF SHOULDER  . TONSILLECTOMY     Family History:  Family History  Problem Relation Age of Onset  . Hypertension Mother   . Stroke Father   . Heart Problems Father    Family Psychiatric  History: see H&P Social History   Substance and Sexual Activity  Alcohol Use No  . Alcohol/week: 0.0 standard drinks     Social History   Substance and Sexual Activity  Drug Use No    Social History   Socioeconomic History  . Marital status: Single    Spouse name:  Not on file  . Number of children: Not on file  . Years of education: Not on file  . Highest education level: Not on file  Occupational History  . Not on file  Tobacco Use  . Smoking status: Never Smoker  . Smokeless tobacco: Never Used  Vaping Use  . Vaping Use: Never used  Substance and Sexual Activity  . Alcohol use: No    Alcohol/week: 0.0 standard drinks  . Drug use: No  . Sexual activity: Never  Other Topics Concern  . Not on file  Social History Narrative   The patient never  finished high school but did get his GED. 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 He is currently in disability and [name] is his legal guardian. He has been living in group homes for many years.      No pending legal charges   Social Determinants of Health   Financial Resource Strain:   . Difficulty of Paying Living Expenses: Not on file  Food Insecurity:   . Worried About Charity fundraiser in the Last Year: Not on file  . Ran Out of Food in the Last Year: Not on file  Transportation Needs:   . Lack of Transportation (Medical): Not on file  . Lack of Transportation (Non-Medical): Not on file  Physical Activity:   . Days of Exercise per Week: Not on file  . Minutes of Exercise per Session: Not on file  Stress:   . Feeling of Stress : Not on file  Social Connections:   . Frequency of Communication with Friends and Family: Not on file  . Frequency of Social Gatherings with Friends and Family: Not on file  . Attends Religious Services: Not on file  . Active Member of Clubs or Organizations: Not on file  . Attends Archivist Meetings: Not on file  . Marital Status: Not on file   Additional Social History:                         Sleep: Fair  Appetite:  Fair  Current Medications: Current Facility-Administered Medications  Medication Dose Route Frequency Provider Last Rate Last Admin  . acetaminophen (TYLENOL) tablet 650 mg  650 mg Oral Q6H PRN [name], MD   650 mg at 05/14/20 2122  . alum & mag hydroxide-simeth (MAALOX/MYLANTA) 200-200-20 MG/5ML suspension 30 mL  30 mL Oral Q4H PRN [name], MD      . aspirin EC tablet 81 mg  81 mg Oral Daily [name], [name], MD   81 mg at 05/25/20 3295  . clonazePAM (KLONOPIN) tablet 1 mg  1 mg Oral QID [name], [name], MD   1 mg at 05/25/20 1884  . divalproex (DEPAKOTE ER) 24 hr tablet 1,500 mg  1,500 mg Oral QHS [name], [name], MD    1,500 mg at 05/24/20 2129  . gabapentin (NEURONTIN) capsule 100 mg  100 mg Oral TID [name], [name], MD   100 mg at 05/25/20 1660  . hydrOXYzine (ATARAX/VISTARIL) tablet 10 mg  10 mg Oral TID PRN [name], MD   10 mg at 05/13/20 1519  . influenza vac split quadrivalent PF (FLUARIX) injection 0.5 mL  0.5 mL Intramuscular Tomorrow-1000 [name], [name], MD      . lamoTRIgine (LAMICTAL) tablet 150 mg  150 mg Oral QHS Salley Scarlet, MD  150 mg at 05/24/20 2129  . linagliptin (TRADJENTA) tablet 5 mg  5 mg Oral Daily [name], [name], MD   5 mg at 05/25/20 3013  . loperamide (IMODIUM) capsule 2 mg  2 mg Oral Q4H PRN [name], MD   2 mg at 04/10/20 0917  . loratadine (CLARITIN) tablet 10 mg  10 mg Oral Daily [name], [name], MD   10 mg at 05/25/20 1438  . LORazepam (ATIVAN) tablet 2 mg  2 mg Oral Q6H PRN Salley Scarlet, MD   2 mg at 05/21/20 1614  . magnesium hydroxide (MILK OF MAGNESIA) suspension 30 mL  30 mL Oral Daily PRN [name], MD   30 mL at 03/23/20 1344  . metFORMIN (GLUCOPHAGE) tablet 850 mg  850 mg Oral BID WC [name], [name], MD   850 mg at 05/25/20 0741  . metoprolol succinate (TOPROL-XL) 24 hr tablet 50 mg  50 mg Oral Daily [name], [name], MD   50 mg at 05/25/20 8875  . ondansetron (ZOFRAN) tablet 4 mg  4 mg Oral Q8H PRN [name], [name], MD   4 mg at 04/30/20 2035  . oxybutynin (DITROPAN) tablet 10 mg  10 mg Oral BID [name], [name], MD   10 mg at 05/25/20 7972  . pantoprazole (PROTONIX) EC tablet 40 mg  40 mg Oral Daily [name], [name], MD   40 mg at 05/25/20 0741  . prazosin (MINIPRESS) capsule 1 mg  1 mg Oral QHS [name], [name], MD   1 mg at 05/23/20 2110  . QUEtiapine (SEROQUEL) tablet 100 mg  100 mg Oral QID [name], [name], MD   100 mg at 05/25/20 8206  . QUEtiapine (SEROQUEL) tablet 400 mg  400 mg Oral QHS [name], NP   400 mg at 05/24/20 2128  . sertraline (ZOLOFT) tablet 50 mg  50 mg Oral BID [name], [name], MD   50 mg at 05/25/20 0156  .  simethicone (MYLICON) chewable tablet 80 mg  80 mg Oral TID [name], [name], MD   80 mg at 05/25/20 1537  . simvastatin (ZOCOR) tablet 20 mg  20 mg Oral q1800 [name], [name], MD   20 mg at 05/24/20 1739  . tamsulosin (FLOMAX) capsule 0.8 mg  0.8 mg Oral QPC supper [name], [name], MD   0.8 mg at 05/24/20 1739    Lab Results:  No results found for this or any previous visit (from the past 48 hour(s)).  Blood Alcohol level:  Lab Results  Component Value Date   ETH <10 01/22/2020   ETH <10 94/32/7614    Metabolic Disorder Labs: Lab Results  Component Value Date   HGBA1C 6.6 (H) 02/20/2020   MPG 142.72 02/20/2020   No results found for: PROLACTIN Lab Results  Component Value Date   CHOL 153 04/10/2015   TRIG 166 (H) 04/10/2015   HDL 50 04/10/2015   CHOLHDL 3.1 04/10/2015   VLDL 33 04/10/2015   LDLCALC 70 04/10/2015    Physical Findings: AIMS: Facial and Oral Movements Muscles of Facial Expression: None, normal Lips and Perioral Area: None, normal Jaw: None, normal Tongue: None, normal,Extremity Movements Upper (arms, wrists, hands, fingers): None, normal Lower (legs, knees, ankles, toes): None, normal, Trunk Movements Neck, shoulders, hips: None, normal, Overall Severity Severity of abnormal movements (highest score from questions above): None, normal Incapacitation due to abnormal movements: None, normal Patient's awareness of abnormal movements (rate only patient's report): No Awareness, Dental Status Current  problems with teeth and/or dentures?: No Does patient usually wear dentures?: No  CIWA:    COWS:     Musculoskeletal: Strength & Muscle Tone: within normal limits Gait & Station: normal Patient leans: N/A  Psychiatric Specialty Exam: Physical Exam Vitals and nursing note reviewed.  Constitutional:      Appearance: Normal appearance.  HENT:     Head: Normocephalic and atraumatic.     Right Ear: External ear normal.     Left Ear: External ear normal.      Nose: Nose normal.     Mouth/Throat:     Mouth: Mucous membranes are moist.     Pharynx: Oropharynx is clear.  Eyes:     Extraocular Movements: Extraocular movements intact.     Conjunctiva/sclera: Conjunctivae normal.     Pupils: Pupils are equal, round, and reactive to light.  Cardiovascular:     Rate and Rhythm: Normal rate.     Pulses: Normal pulses.  Pulmonary:     Effort: Pulmonary effort is normal.     Breath sounds: Normal breath sounds.  Abdominal:     General: Abdomen is flat.     Palpations: Abdomen is soft.  Musculoskeletal:        General: No swelling. Normal range of motion.     Cervical back: Normal range of motion and neck supple.  Skin:    General: Skin is warm and dry.  Neurological:     General: No focal deficit present.     Mental Status: He is alert and oriented to person, place, and time.  Psychiatric:        Attention and Perception: Attention and perception normal.        Mood and Affect: Mood is anxious.        Speech: Speech normal.        Behavior: Behavior is cooperative.        Thought Content: Thought content is not paranoid. Thought content does not include homicidal or suicidal ideation.        Cognition and Memory: Cognition and memory normal.        Judgment: Judgment is impulsive.     Review of Systems  Constitutional: Negative for activity change and fatigue.  HENT: Negative for rhinorrhea and sore throat.   Eyes: Negative for photophobia and visual disturbance.  Respiratory: Negative for cough and shortness of breath.   Cardiovascular: Negative for chest pain and palpitations.  Gastrointestinal: Negative for constipation, diarrhea, nausea and vomiting.  Endocrine: Negative for cold intolerance and heat intolerance.  Genitourinary: Negative for difficulty urinating and dysuria.  Musculoskeletal: Negative for arthralgias and myalgias.  Skin: Negative for rash and wound.  Allergic/Immunologic: Negative for food allergies and  immunocompromised state.  Neurological: Negative for dizziness and headaches.  Hematological: Negative for adenopathy. Does not bruise/bleed easily.  Psychiatric/Behavioral: Negative for hallucinations and suicidal ideas. The patient is nervous/anxious.     Blood pressure 120/87, pulse 78, temperature 97.8 F (36.6 C), temperature source Oral, resp. rate 17, height 5\' 8"  (1.727 m), weight 104.3 kg, SpO2 98 %.Body mass index is 34.96 kg/m.  General Appearance: Disheveled  Eye Contact:  Fair  Speech:  Slightly pressured  Volume:  Normal  Mood:  Euthymic  Affect:  Constricted  Thought Process:  Coherent  Orientation:  Full (Time, Place, and Person)  Thought Content:  Illogical  Suicidal Thoughts:  No  Homicidal Thoughts:  No  Memory:  NA  Judgement:  Impaired  Insight:  Shallow  Psychomotor  Activity: wnl  Concentration:  Concentration: Poor and Attention Span: Poor  Recall:  [employer] of Knowledge:  Fair  Language:  Fair  Akathisia:  No  Handed:  Right  AIMS (if indicated):     Assets:  Communication Skills Desire for Improvement  ADL's:  Intact  Cognition:  Impaired,  Mild  Sleep:  Number of Hours: 6.5     Treatment Plan Summary: Daily contact with patient to assess and evaluate symptoms and progress in treatment and Medication management   Patient is a 64 year old male with the above-stated past psychiatric history who is seen in follow-up.  Chart reviewed. Patient discussed with nursing. Patient appears stable, likely at his mental baseline. He is awaiting placement. Referrals sent by SW.    Plan:  -continue inpatient psych admission; 15-minute checks; daily contact with patient to assess and evaluate symptoms and progress in treatment; psychoeducation.  -continue scheduled medications without changes today. Marland Kitchen aspirin EC  81 mg Oral Daily  . clonazePAM  1 mg Oral QID  . divalproex  1,500 mg Oral QHS  . gabapentin  100 mg Oral TID  . influenza vac split  quadrivalent PF  0.5 mL Intramuscular Tomorrow-1000  . lamoTRIgine  150 mg Oral QHS  . linagliptin  5 mg Oral Daily  . loratadine  10 mg Oral Daily  . metFORMIN  850 mg Oral BID WC  . metoprolol succinate  50 mg Oral Daily  . oxybutynin  10 mg Oral BID  . pantoprazole  40 mg Oral Daily  . prazosin  1 mg Oral QHS  . QUEtiapine  100 mg Oral QID  . QUEtiapine  400 mg Oral QHS  . sertraline  50 mg Oral BID  . simethicone  80 mg Oral TID  . simvastatin  20 mg Oral q1800  . tamsulosin  0.8 mg Oral QPC supper   -continue PRN medications. acetaminophen, alum & mag hydroxide-simeth, hydrOXYzine, loperamide, LORazepam, magnesium hydroxide, ondansetron  -Disposition: to be determined.  Continuing to search for placement.  Salley Scarlet, MD 05/25/2020, 11:31 AM

## 2020-05-25 NOTE — B[facility] Counselor (Signed)
CSW faxed information to [facility]/  CSW received confirmation that fax was successful.    Assunta Curtis, MSW, LCSW 05/25/2020 3:50 PM

## 2020-05-25 NOTE — Progress Notes (Signed)
°   05/25/20 1400  Clinical Encounter Type  Visited With Patient  Visit Type Follow-up;Spiritual support;Social support;Behavioral Health  Referral From Chaplain  Consult/Referral To Chaplain  Pt attended Ch group today. Pt participated in group. Ch will follow-up with Pt.

## 2020-05-26 NOTE — Progress Notes (Signed)
Recreation Therapy Notes  Date: 05/26/2020  Time: 9:30 am   Location: Craft room  Behavioral response: Appropriate  Intervention Topic: Happiness    Discussion/Intervention:  Group content today was focused on Happiness. The group defined happiness and described where happiness comes from. Individuals identified what makes them happy and how they go about making others happy. Patients expressed things that stop them from being happy and ways they can improve their happiness. The group stated reasons why it is important to be happy. The group participated in the intervention "My Happiness", where they had a chance to identify and express things that make them happy. Clinical Observations/Feedback: Patient came to group and defined happiness as getting back to my old way of life. He stated what would make him happy is to get into a group home. Participant expressed that getting the Covid virus under control would make everyone happy. Individual was social with peers and staff while participating in the intervention.  Antwione Picotte LRT/CTRS          Yarielys Beed 05/26/2020 12:26 PM

## 2020-05-26 NOTE — B[facility] Counselor (Signed)
CSW supported the patient in completing a mock interview for the group home interview the patient has tomorrow.  Nurse Arlyss Repress conducted the interview.  Patient did well.  Assunta Curtis, MSW, LCSW 05/26/2020 3:11 PM

## 2020-05-26 NOTE — Progress Notes (Signed)
[facility] MD Progress Note  05/26/2020 10:10 AM AH BOTT  MRN:  [id number]  Principal Problem: Schizoaffective disorder, bipolar type ([place]) Diagnosis: Principal Problem:   Schizoaffective disorder, bipolar type ([place]) Active Problems:   Active autistic disorder   Diabetes ([place])   Hypertension   Prostate hypertrophy   Intellectual disability   At risk for elopement  Alexander Beard is a 64 y.o. male pt that has a previous psychiatric history of Schizoaffective disorder who presents to the [facility] unit for treatment of psychosis.  Interval History Patient was seen today for re-evaluation.  Nursing reports no events overnight. The patient reports no issues with performing ADLs.  Patient has been medication compliant.  The patient reports no side effects from medications.    SUBJECTIVE:  Patient seen one-on-one this morning.  He reports "okay" mood; denies feeling depressed.  He reports anxiety regarding unclearness of his future plan and placement. He has completed a list of things he wants out of a group home. Some of the items on his list (watching his favorite TV shows, playing different games with peers, and having sketchpad to do artwork) are very easily accommodated, while others are not currently realistic. For example, he wants his brother to pick him up and take him home on the weekends as he had in the past. However, brother currently in very poor health and unable to drive. He was reminded that he will have to compromise on several items currently due to brother's health, and covid restrictions. He denies suicidal or homicidal thoughts. He denies any hallucinations at the time of the interview. He denies side effects from his current medications. He denies any physical complaints.   Total Time spent with patient: 30 minutes  Past Psychiatric History: see H&P   Past Medical History:  Past Medical History:  Diagnosis Date  . Anxiety   . Anxiety disorder due to known physiological condition     HOSPITALIZED 10/18  . Arthritis   . Autistic disorder, residual state   . COPD (chronic obstructive pulmonary disease) ([place])   . Depression   . Developmental disorder   . Diabetes mellitus without complication ([place])   . Dyslipidemia   . Esophageal reflux   . HOH (hard of hearing)    MILDLY  . Hypertension   . Obesity   . Overactive bladder   . Palpitations    ANXIETY  . Schizophrenia, schizoaffective ([place])   . Sleep apnea   . Tremors of nervous system    HANDS DUE TO MEDICATIONS  . Urinary incontinence     Past Surgical History:  Procedure Laterality Date  . CATARACT EXTRACTION W/PHACO Left 11/14/2017   Procedure: CATARACT EXTRACTION PHACO AND INTRAOCULAR LENS PLACEMENT (IOC);  Surgeon: Birder Robson, MD;  Location: [facility] ORS;  Service: Ophthalmology;  Laterality: Left;  [place] 00:42 AP% 14.6 CDE 6.12 Fluid pack lot # [id number]  . COLONOSCOPY  01/28/2005  . COLONOSCOPY WITH PROPOFOL N/A 05/09/2016   Procedure: COLONOSCOPY WITH PROPOFOL;  Surgeon: Manya Silvas, MD;  Location: [facility] ENDOSCOPY;  Service: Endoscopy;  Laterality: N/A;  . ESOPHAGOGASTRODUODENOSCOPY N/A 05/09/2016   Procedure: ESOPHAGOGASTRODUODENOSCOPY (EGD);  Surgeon: Manya Silvas, MD;  Location: [facility] ENDOSCOPY;  Service: Endoscopy;  Laterality: N/A;  . FRACTURE SURGERY     ORIF SHOULDER  . TONSILLECTOMY     Family History:  Family History  Problem Relation Age of Onset  . Hypertension Mother   . Stroke Father   . Heart Problems Father    Family Psychiatric  History: see H&P Social History   Substance and Sexual Activity  Alcohol Use No  . Alcohol/week: 0.0 standard drinks     Social History   Substance and Sexual Activity  Drug Use No    Social History   Socioeconomic History  . Marital status: Single    Spouse name: Not on file  . Number of children: Not on file  . Years of education: Not on file  . Highest education level: Not on file  Occupational History  . Not on file   Tobacco Use  . Smoking status: Never Smoker  . Smokeless tobacco: Never Used  Vaping Use  . Vaping Use: Never used  Substance and Sexual Activity  . Alcohol use: No    Alcohol/week: 0.0 standard drinks  . Drug use: No  . Sexual activity: Never  Other Topics Concern  . Not on file  Social History Narrative   The patient never finished high school but did get his GED. 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 He is currently in disability and [name] is his legal guardian. He has been living in group homes for many years.      No pending legal charges   Social Determinants of Health   Financial Resource Strain:   . Difficulty of Paying Living Expenses: Not on file  Food Insecurity:   . Worried About Charity fundraiser in the Last Year: Not on file  . Ran Out of Food in the Last Year: Not on file  Transportation Needs:   . Lack of Transportation (Medical): Not on file  . Lack of Transportation (Non-Medical): Not on file  Physical Activity:   . Days of Exercise per Week: Not on file  . Minutes of Exercise per Session: Not on file  Stress:   . Feeling of Stress : Not on file  Social Connections:   . Frequency of Communication with Friends and Family: Not on file  . Frequency of Social Gatherings with Friends and Family: Not on file  . Attends Religious Services: Not on file  . Active Member of Clubs or Organizations: Not on file  . Attends Archivist Meetings: Not on file  . Marital Status: Not on file   Additional Social History:                         Sleep: Fair  Appetite:  Fair  Current Medications: Current Facility-Administered Medications  Medication Dose Route Frequency Provider Last Rate Last Admin  . acetaminophen (TYLENOL) tablet 650 mg  650 mg Oral Q6H PRN [name], MD   650 mg at 05/14/20 2122  . alum & mag hydroxide-simeth (MAALOX/MYLANTA) 200-200-20 MG/5ML  suspension 30 mL  30 mL Oral Q4H PRN [name], MD      . aspirin EC tablet 81 mg  81 mg Oral Daily [name], [name], MD   81 mg at 05/26/20 0814  . clonazePAM (KLONOPIN) tablet 1 mg  1 mg Oral QID [name], [name], MD   1 mg at 05/26/20 0814  . divalproex (DEPAKOTE ER) 24 hr tablet 1,500 mg  1,500 mg Oral QHS [name], [name], MD   1,500 mg at 05/25/20 2124  . gabapentin (NEURONTIN) capsule 100 mg  100 mg Oral TID [name], [name], MD   100 mg at 05/26/20 0814  . hydrOXYzine (ATARAX/VISTARIL) tablet 10 mg  10 mg Oral TID PRN [name], MD   10 mg at 05/13/20 1519  . influenza vac split quadrivalent PF (FLUARIX) injection 0.5 mL  0.5 mL Intramuscular Tomorrow-1000 [name], [name], MD      . lamoTRIgine (LAMICTAL) tablet 150 mg  150 mg Oral QHS Salley Scarlet, MD   150 mg at 05/25/20 2123  . linagliptin (TRADJENTA) tablet 5 mg  5 mg Oral Daily [name], [name], MD   5 mg at 05/26/20 0814  . loperamide (IMODIUM) capsule 2 mg  2 mg Oral Q4H PRN [name], MD   2 mg at 04/10/20 0917  . loratadine (CLARITIN) tablet 10 mg  10 mg Oral Daily [name], [name], MD   10 mg at 05/26/20 0814  . LORazepam (ATIVAN) tablet 2 mg  2 mg Oral Q6H PRN Salley Scarlet, MD   2 mg at 05/21/20 1614  . magnesium hydroxide (MILK OF MAGNESIA) suspension 30 mL  30 mL Oral Daily PRN [name], MD   30 mL at 03/23/20 1344  . metFORMIN (GLUCOPHAGE) tablet 850 mg  850 mg Oral BID WC [name], [name], MD   850 mg at 05/26/20 0811  . metoprolol succinate (TOPROL-XL) 24 hr tablet 50 mg  50 mg Oral Daily [name], [name], MD   50 mg at 05/26/20 0814  . ondansetron (ZOFRAN) tablet 4 mg  4 mg Oral Q8H PRN [name], [name], MD   4 mg at 04/30/20 2035  . oxybutynin (DITROPAN) tablet 10 mg  10 mg Oral BID [name], [name], MD   10 mg at 05/26/20 0811  . pantoprazole (PROTONIX) EC tablet 40 mg  40 mg Oral Daily [name], [name], MD   40 mg at 05/26/20 0814  . prazosin (MINIPRESS) capsule 1 mg  1 mg Oral QHS [name], [name],  MD   1 mg at 05/23/20 2110  . QUEtiapine (SEROQUEL) tablet 100 mg  100 mg Oral QID [name], [name], MD   100 mg at 05/26/20 0814  . QUEtiapine (SEROQUEL) tablet 400 mg  400 mg Oral QHS [name], NP   400 mg at 05/25/20 2125  . sertraline (ZOLOFT) tablet 50 mg  50 mg Oral BID [name], [name], MD   50 mg at 05/26/20 0814  . simethicone (MYLICON) chewable tablet 80 mg  80 mg Oral TID [name], [name], MD   80 mg at 05/26/20 0814  . simvastatin (ZOCOR) tablet 20 mg  20 mg Oral q1800 [name], [name], MD   20 mg at 05/25/20 1722  . tamsulosin (FLOMAX) capsule 0.8 mg  0.8 mg Oral QPC supper [name], [name], MD   0.8 mg at 05/25/20 1721    Lab Results:  No results found for this or any previous visit (from the past 48 hour(s)).  Blood Alcohol level:  Lab Results  Component Value Date   ETH <10 01/22/2020   ETH <10 71/69/6789    Metabolic Disorder Labs: Lab Results  Component Value Date   HGBA1C 6.6 (H) 02/20/2020   MPG 142.72 02/20/2020   No results found for: PROLACTIN Lab Results  Component Value Date   CHOL 153 04/10/2015   TRIG 166 (H) 04/10/2015   HDL 50 04/10/2015   CHOLHDL 3.1 04/10/2015   VLDL 33 04/10/2015   LDLCALC 70 04/10/2015    Physical Findings: AIMS: Facial and Oral Movements Muscles of Facial Expression: None, normal Lips and Perioral Area: None, normal Jaw: None, normal Tongue: None,  normal,Extremity Movements Upper (arms, wrists, hands, fingers): None, normal Lower (legs, knees, ankles, toes): None, normal, Trunk Movements Neck, shoulders, hips: None, normal, Overall Severity Severity of abnormal movements (highest score from questions above): None, normal Incapacitation due to abnormal movements: None, normal Patient's awareness of abnormal movements (rate only patient's report): No Awareness, Dental Status Current problems with teeth and/or dentures?: No Does patient usually wear dentures?: No  CIWA:    COWS:     Musculoskeletal: Strength  & Muscle Tone: within normal limits Gait & Station: normal Patient leans: N/A  Psychiatric Specialty Exam: Physical Exam Vitals and nursing note reviewed.  Constitutional:      Appearance: Normal appearance.  HENT:     Head: Normocephalic and atraumatic.     Right Ear: External ear normal.     Left Ear: External ear normal.     Nose: Nose normal.     Mouth/Throat:     Mouth: Mucous membranes are moist.     Pharynx: Oropharynx is clear.  Eyes:     Extraocular Movements: Extraocular movements intact.     Conjunctiva/sclera: Conjunctivae normal.     Pupils: Pupils are equal, round, and reactive to light.  Cardiovascular:     Rate and Rhythm: Normal rate.     Pulses: Normal pulses.  Pulmonary:     Effort: Pulmonary effort is normal.     Breath sounds: Normal breath sounds.  Abdominal:     General: Abdomen is flat.     Palpations: Abdomen is soft.  Musculoskeletal:        General: No swelling. Normal range of motion.     Cervical back: Normal range of motion and neck supple.  Skin:    General: Skin is warm and dry.  Neurological:     General: No focal deficit present.     Mental Status: He is alert and oriented to person, place, and time.  Psychiatric:        Attention and Perception: Attention and perception normal.        Mood and Affect: Mood is anxious.        Speech: Speech normal.        Behavior: Behavior is cooperative.        Thought Content: Thought content is not paranoid. Thought content does not include homicidal or suicidal ideation.        Cognition and Memory: Cognition and memory normal.        Judgment: Judgment is impulsive.     Review of Systems  Constitutional: Negative for activity change and fatigue.  HENT: Negative for rhinorrhea and sore throat.   Eyes: Negative for photophobia and visual disturbance.  Respiratory: Negative for cough and shortness of breath.   Cardiovascular: Negative for chest pain and palpitations.  Gastrointestinal:  Negative for constipation, diarrhea, nausea and vomiting.  Endocrine: Negative for cold intolerance and heat intolerance.  Genitourinary: Negative for difficulty urinating and dysuria.  Musculoskeletal: Negative for arthralgias and myalgias.  Skin: Negative for rash and wound.  Allergic/Immunologic: Negative for food allergies and immunocompromised state.  Neurological: Negative for dizziness and headaches.  Hematological: Negative for adenopathy. Does not bruise/bleed easily.  Psychiatric/Behavioral: Negative for hallucinations and suicidal ideas. The patient is nervous/anxious.     Blood pressure 127/76, pulse 64, temperature 97.6 F (36.4 C), temperature source Oral, resp. rate 18, height 5\' 8"  (1.727 m), weight 104.3 kg, SpO2 96 %.Body mass index is 34.96 kg/m.  General Appearance: Disheveled  Eye Contact:  Fair  Speech:  Normal rate  Volume:  Normal  Mood:  Euthymic  Affect:  Constricted  Thought Process:  Coherent  Orientation:  Full (Time, Place, and Person)  Thought Content:  Illogical  Suicidal Thoughts:  No  Homicidal Thoughts:  No  Memory:  NA  Judgement:  Impaired  Insight:  Shallow  Psychomotor Activity: wnl  Concentration:  Concentration: Poor and Attention Span: Poor  Recall:  [employer] of Knowledge:  Fair  Language:  Fair  Akathisia:  No  Handed:  Right  AIMS (if indicated):     Assets:  Communication Skills Desire for Improvement  ADL's:  Intact  Cognition:  Impaired,  Mild  Sleep:  Number of Hours: 6     Treatment Plan Summary: Daily contact with patient to assess and evaluate symptoms and progress in treatment and Medication management   Patient is a 64 year old male with the above-stated past psychiatric history who is seen in follow-up.  Chart reviewed. Patient discussed with nursing. Patient appears stable, likely at his mental baseline. He is awaiting placement. Referrals sent by SW.    Plan:  -continue inpatient psych admission; 15-minute  checks; daily contact with patient to assess and evaluate symptoms and progress in treatment; psychoeducation.  -continue scheduled medications without changes today. Marland Kitchen aspirin EC  81 mg Oral Daily  . clonazePAM  1 mg Oral QID  . divalproex  1,500 mg Oral QHS  . gabapentin  100 mg Oral TID  . influenza vac split quadrivalent PF  0.5 mL Intramuscular Tomorrow-1000  . lamoTRIgine  150 mg Oral QHS  . linagliptin  5 mg Oral Daily  . loratadine  10 mg Oral Daily  . metFORMIN  850 mg Oral BID WC  . metoprolol succinate  50 mg Oral Daily  . oxybutynin  10 mg Oral BID  . pantoprazole  40 mg Oral Daily  . prazosin  1 mg Oral QHS  . QUEtiapine  100 mg Oral QID  . QUEtiapine  400 mg Oral QHS  . sertraline  50 mg Oral BID  . simethicone  80 mg Oral TID  . simvastatin  20 mg Oral q1800  . tamsulosin  0.8 mg Oral QPC supper   -continue PRN medications. acetaminophen, alum & mag hydroxide-simeth, hydrOXYzine, loperamide, LORazepam, magnesium hydroxide, ondansetron  -Disposition: to be determined.  Continuing to search for placement.  Salley Scarlet, MD 05/26/2020, 10:10 AM

## 2020-05-26 NOTE — BHH Group Notes (Signed)
LCSW Group Therapy Note  05/26/2020 2:04 PM  Type of Therapy/Topic:  Group Therapy:  Feelings about Diagnosis  Participation Level:  Active   Description of Group:   This group will allow patients to explore their thoughts and feelings about diagnoses they have received. Patients will be guided to explore their level of understanding and acceptance of these diagnoses. Facilitator will encourage patients to process their thoughts and feelings about the reactions of others to their diagnosis and will guide patients in identifying ways to discuss their diagnosis with significant others in their lives. This group will be process-oriented, with patients participating in exploration of their own experiences, giving and receiving support, and processing challenge from other group members.   Therapeutic Goals: 1. Patient will demonstrate understanding of diagnosis as evidenced by identifying two or more symptoms of the disorder 2. Patient will be able to express two feelings regarding the diagnosis 3. Patient will demonstrate their ability to communicate their needs through discussion and/or role play  Summary of Patient Progress: Patient was present and active in the group discussion. He did get off topic at times but was easily redirectable. Pt was able to identify things to help deal with mental health triggers like relaxing, taking a moment away, artwork, and going outside and participating in corn hole.   Therapeutic Modalities:   Cognitive Behavioral Therapy Brief Therapy Feelings Identification   Yulieth Carrender R. [name], MSW, [place], [place] 05/26/2020 2:04 PM

## 2020-05-26 NOTE — Progress Notes (Signed)
   05/26/20 2112  Psych Admission Type (Psych Patients Only)  Admission Status Involuntary  Psychosocial Assessment  Patient Complaints None  Eye Contact Fair  Facial Expression Flat  Affect Flat  Speech Logical/coherent  Interaction Assertive  Motor Activity Slow  Appearance/Hygiene Unremarkable  Behavior Characteristics Appropriate to situation;Cooperative  Mood Pleasant  Thought Process  Coherency WDL  Content WDL  Delusions None reported or observed  Perception WDL  Hallucination None reported or observed  Judgment UTA  Confusion None  Danger to Self  Current suicidal ideation? Denies  Danger to Others  Danger to Others None reported or observed  D: Patient presents with flat but pleasant affect. Patient reports he had a good day. Patient denies SI/HI at this time. Patient also denies AH/VH at this time.  A: Provided positive reinforcement and encouragement.  R: Patient cooperative and receptive to efforts. Patient remains safe on unit.

## 2020-05-26 NOTE — Plan of Care (Signed)
Pt denies depression, anxiety, SI, HI and AVH. Pt was educated on care plan and verbalizes understanding. Alexander Bullock RN Problem: Education: Goal: Knowledge of [place] Education information/materials will improve Outcome: Progressing Goal: Emotional status will improve Outcome: Progressing Goal: Mental status will improve Outcome: Progressing Goal: Verbalization of understanding the information provided will improve Outcome: Progressing   Problem: Activity: Goal: Interest or engagement in activities will improve Outcome: Progressing Goal: Sleeping patterns will improve Outcome: Progressing   Problem: Coping: Goal: Ability to verbalize frustrations and anger appropriately will improve Outcome: Progressing Goal: Ability to demonstrate self-control will improve Outcome: Progressing   Problem: Health Behavior/Discharge Planning: Goal: Identification of resources available to assist in meeting health care needs will improve Outcome: Progressing Goal: Compliance with treatment plan for underlying cause of condition will improve Outcome: Progressing   Problem: Physical Regulation: Goal: Ability to maintain clinical measurements within normal limits will improve Outcome: Progressing   Problem: Safety: Goal: Periods of time without injury will increase Outcome: Progressing   Problem: Education: Goal: Knowledge of [place] [facility] Education information/materials will improve Outcome: Progressing Goal: Emotional status will improve Outcome: Progressing Goal: Mental status will improve Outcome: Progressing Goal: Verbalization of understanding the information provided will improve Outcome: Progressing   Problem: Activity: Goal: Interest or engagement in activities will improve Outcome: Progressing Goal: Sleeping patterns will improve Outcome: Progressing   Problem: Coping: Goal: Ability to verbalize frustrations and anger appropriately will improve Outcome:  Progressing Goal: Ability to demonstrate self-control will improve Outcome: Progressing   Problem: Health Behavior/Discharge Planning: Goal: Identification of resources available to assist in meeting health care needs will improve Outcome: Progressing Goal: Compliance with treatment plan for underlying cause of condition will improve Outcome: Progressing   Problem: Physical Regulation: Goal: Ability to maintain clinical measurements within normal limits will improve Outcome: Progressing   Problem: Safety: Goal: Periods of time without injury will increase Outcome: Progressing   Problem: Education: Goal: Knowledge of [place] [facility] Education information/materials will improve Outcome: Progressing Goal: Emotional status will improve Outcome: Progressing Goal: Mental status will improve Outcome: Progressing Goal: Verbalization of understanding the information provided will improve Outcome: Progressing   Problem: Activity: Goal: Interest or engagement in activities will improve Outcome: Progressing Goal: Sleeping patterns will improve Outcome: Progressing   Problem: Coping: Goal: Ability to verbalize frustrations and anger appropriately will improve Outcome: Progressing Goal: Ability to demonstrate self-control will improve Outcome: Progressing   Problem: Health Behavior/Discharge Planning: Goal: Identification of resources available to assist in meeting health care needs will improve Outcome: Progressing Goal: Compliance with treatment plan for underlying cause of condition will improve Outcome: Progressing   Problem: Physical Regulation: Goal: Ability to maintain clinical measurements within normal limits will improve Outcome: Progressing   Problem: Safety: Goal: Periods of time without injury will increase Outcome: Progressing   Problem: Education: Goal: Knowledge of [place] [facility] Education information/materials will improve Outcome:  Progressing Goal: Emotional status will improve Outcome: Progressing Goal: Mental status will improve Outcome: Progressing Goal: Verbalization of understanding the information provided will improve Outcome: Progressing   Problem: Activity: Goal: Interest or engagement in activities will improve Outcome: Progressing Goal: Sleeping patterns will improve Outcome: Progressing   Problem: Coping: Goal: Ability to verbalize frustrations and anger appropriately will improve Outcome: Progressing Goal: Ability to demonstrate self-control will improve Outcome: Progressing   Problem: Health Behavior/Discharge Planning: Goal: Identification of resources available to assist in meeting health care needs will improve Outcome: Progressing Goal: Compliance with treatment plan for  underlying cause of condition will improve Outcome: Progressing   Problem: Physical Regulation: Goal: Ability to maintain clinical measurements within normal limits will improve Outcome: Progressing   Problem: Safety: Goal: Periods of time without injury will increase Outcome: Progressing   Problem: Education: Goal: Ability to make informed decisions regarding treatment will improve Outcome: Progressing   Problem: Coping: Goal: Coping ability will improve Outcome: Progressing   Problem: Health Behavior/Discharge Planning: Goal: Identification of resources available to assist in meeting health care needs will improve Outcome: Progressing   Problem: Medication: Goal: Compliance with prescribed medication regimen will improve Outcome: Progressing   Problem: Self-Concept: Goal: Ability to disclose and discuss suicidal ideas will improve Outcome: Progressing Goal: Will verbalize positive feelings about self Outcome: Progressing

## 2020-05-26 NOTE — Progress Notes (Signed)
D- Patient alert and oriented. Affect/mood is pleasant, calm and cooperative. Pt denies SI, HI, AVH, and pain.   A- Scheduled medications administered to patient, per MD orders. Support and encouragement provided.  Routine safety checks conducted every 15 minutes.  Patient informed to notify staff with problems or concerns.  R- No adverse drug reactions noted. Patient contracts for safety at this time. Patient compliant with medications and treatment plan. Patient receptive, calm, and cooperative. Patient interacts well with others on the unit.  Patient remains safe at this time.   Collier Bullock RN

## 2020-05-27 NOTE — BHH Counselor (Signed)
CSW attempted to call Roslyn Smiling, Care Coordinator, [phone].  CSW left HIPAA compliant voicemail.  Assunta Curtis, MSW, LCSW 05/27/2020 4:00 PM

## 2020-05-27 NOTE — BHH Counselor (Addendum)
CSW spoke with Vallery Ridge, brother, [phone]. He reports that he thinks that the patient's former home, not the most recent, but one from the past is going to take the patient and they are looking into this home.  He appeared upset at the location of this home and sighed when CSW provided that the home is located in [place].   CSW later supported Dr. Domingo Cocking in calling pt's brother in regards to possible placement at this group home.  Pt's brother disposition remained flat.  Both CSW and physician, stated that this group home sounds like a good fit for the patient.   Assunta Curtis, MSW, LCSW 05/27/2020 3:08 PM

## 2020-05-27 NOTE — BHH Group Notes (Signed)
LCSW Group Therapy Note  05/27/2020 2:45 PM  Type of Therapy/Topic:  Group Therapy:  Emotion Regulation  Participation Level:  Active   Description of Group:   The purpose of this group is to assist patients in learning to regulate negative emotions and experience positive emotions. Patients will be guided to discuss ways in which they have been vulnerable to their negative emotions. These vulnerabilities will be juxtaposed with experiences of positive emotions or situations, and patients will be challenged to use positive emotions to combat negative ones. Special emphasis will be placed on coping with negative emotions in conflict situations, and patients will process healthy conflict resolution skills.  Therapeutic Goals: 1. Patient will identify two positive emotions or experiences to reflect on in order to balance out negative emotions 2. Patient will label two or more emotions that they find the most difficult to experience 3. Patient will demonstrate positive conflict resolution skills through discussion and/or role plays  Summary of Patient Progress: Pt. shared that he is able to recognize emotions and utilize coping strategies to regulate emotional variability. Pt contributed by sharing coping strategies that he finds effects, specifically, prayer, laying down, amongst others. Pt also socialized with others in the group and was able to contribute to the topic of discussion.     Therapeutic Modalities:   Cognitive Behavioral Therapy Feelings Identification Dialectical Behavioral Therapy  Paulla Dolly, MSW, [place] 05/27/2020 2:45 PM

## 2020-05-27 NOTE — Progress Notes (Addendum)
   05/27/20 2115  Psych Admission Type (Psych Patients Only)  Admission Status Involuntary  Psychosocial Assessment  Patient Complaints None  Eye Contact Fair  Facial Expression Flat  Affect Flat  Speech Logical/coherent  Interaction Assertive  Motor Activity Slow  Appearance/Hygiene Unremarkable  Behavior Characteristics Cooperative;Appropriate to situation  Mood Pleasant  Thought Process  Coherency WDL  Content WDL  Delusions None reported or observed  Perception WDL  Hallucination None reported or observed  Judgment Poor  Confusion WDL  Danger to Self  Current suicidal ideation? Denies  Danger to Others  Danger to Others None reported or observed  D: Patient presents with flat affect and is minimal upon interaction but pleasant. Patient denies SI/HI at this time. Patient also denies AH/VH at this time.  A: Provided positive reinforcement and encouragement.  R: Patient cooperative and receptive to efforts. Patient remains safe on the unit.

## 2020-05-27 NOTE — Progress Notes (Signed)
[facility] MD Progress Note  05/27/2020 9:29 AM Alexander Beard  MRN:  [id number]  Principal Problem: Schizoaffective disorder, bipolar type ([place]) Diagnosis: Principal Problem:   Schizoaffective disorder, bipolar type ([place]) Active Problems:   Active autistic disorder   Diabetes ([place])   Hypertension   Prostate hypertrophy   Intellectual disability   At risk for elopement  Alexander Beard is a 64 y.o. male pt that has a previous psychiatric history of Schizoaffective disorder who presents to the [facility] unit for treatment of psychosis.  Interval History Patient was seen today for re-evaluation.  Nursing reports no events overnight. The patient reports no issues with performing ADLs.  Patient has been medication compliant.  The patient reports no side effects from medications.    SUBJECTIVE:  Patient seen one-on-one this morning.  He reports "good" mood; denies feeling depressed. He denies suicidal or homicidal thoughts. He denies any hallucinations at the time of the interview. He denies side effects from his current medications. He denies any physical complaints. He is looking forward to his interview with a potential group home this morning. He is also looking forward to going outside this afternoon.    Total Time spent with patient: 30 minutes  Past Psychiatric History: see H&P   Past Medical History:  Past Medical History:  Diagnosis Date  . Anxiety   . Anxiety disorder due to known physiological condition    HOSPITALIZED 10/18  . Arthritis   . Autistic disorder, residual state   . COPD (chronic obstructive pulmonary disease) ([place])   . Depression   . Developmental disorder   . Diabetes mellitus without complication ([place])   . Dyslipidemia   . Esophageal reflux   . HOH (hard of hearing)    MILDLY  . Hypertension   . Obesity   . Overactive bladder   . Palpitations    ANXIETY  . Schizophrenia, schizoaffective ([place])   . Sleep apnea   . Tremors of nervous system    HANDS DUE TO  MEDICATIONS  . Urinary incontinence     Past Surgical History:  Procedure Laterality Date  . CATARACT EXTRACTION W/PHACO Left 11/14/2017   Procedure: CATARACT EXTRACTION PHACO AND INTRAOCULAR LENS PLACEMENT (IOC);  Surgeon: Birder Robson, MD;  Location: [facility] ORS;  Service: Ophthalmology;  Laterality: Left;  [place] 00:42 AP% 14.6 CDE 6.12 Fluid pack lot # [id number]  . COLONOSCOPY  01/28/2005  . COLONOSCOPY WITH PROPOFOL N/A 05/09/2016   Procedure: COLONOSCOPY WITH PROPOFOL;  Surgeon: Manya Silvas, MD;  Location[facility] ENDOSCOPY;  Service: Endoscopy;  Laterality: N/A;  . ESOPHAGOGASTRODUODENOSCOPY N/A 05/09/2016   Procedure: ESOPHAGOGASTRODUODENOSCOPY (EGD);  Surgeon: Manya Silvas, MD;  Location: [facility] ENDOSCOPY;  Service: Endoscopy;  Laterality: N/A;  . FRACTURE SURGERY     ORIF SHOULDER  . TONSILLECTOMY     Family History:  Family History  Problem Relation Age of Onset  . Hypertension Mother   . Stroke Father   . Heart Problems Father    Family Psychiatric  History: see H&P Social History   Substance and Sexual Activity  Alcohol Use No  . Alcohol/week: 0.0 standard drinks     Social History   Substance and Sexual Activity  Drug Use No    Social History   Socioeconomic History  . Marital status: Single    Spouse name: Not on file  . Number of children: Not on file  . Years of education: Not on file  . Highest education level: Not on file  Occupational History  .  Not on file  Tobacco Use  . Smoking status: Never Smoker  . Smokeless tobacco: Never Used  Vaping Use  . Vaping Use: Never used  Substance and Sexual Activity  . Alcohol use: No    Alcohol/week: 0.0 standard drinks  . Drug use: No  . Sexual activity: Never  Other Topics Concern  . Not on file  Social History Narrative   The patient never finished high school but did get his GED. 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 He is currently in  disability and [name] is his legal guardian. He has been living in group homes for many years.      No pending legal charges   Social Determinants of Health   Financial Resource Strain:   . Difficulty of Paying Living Expenses: Not on file  Food Insecurity:   . Worried About Charity fundraiser in the Last Year: Not on file  . Ran Out of Food in the Last Year: Not on file  Transportation Needs:   . Lack of Transportation (Medical): Not on file  . Lack of Transportation (Non-Medical): Not on file  Physical Activity:   . Days of Exercise per Week: Not on file  . Minutes of Exercise per Session: Not on file  Stress:   . Feeling of Stress : Not on file  Social Connections:   . Frequency of Communication with Friends and Family: Not on file  . Frequency of Social Gatherings with Friends and Family: Not on file  . Attends Religious Services: Not on file  . Active Member of Clubs or Organizations: Not on file  . Attends Archivist Meetings: Not on file  . Marital Status: Not on file   Additional Social History:                         Sleep: Fair  Appetite:  Fair  Current Medications: Current Facility-Administered Medications  Medication Dose Route Frequency Provider Last Rate Last Admin  . acetaminophen (TYLENOL) tablet 650 mg  650 mg Oral Q6H PRN [name], MD   650 mg at 05/14/20 2122  . alum & mag hydroxide-simeth (MAALOX/MYLANTA) 200-200-20 MG/5ML suspension 30 mL  30 mL Oral Q4H PRN [name], MD      . aspirin EC tablet 81 mg  81 mg Oral Daily [name], [name], MD   81 mg at 05/27/20 0807  . clonazePAM (KLONOPIN) tablet 1 mg  1 mg Oral QID [name], [name], MD   1 mg at 05/27/20 0806  . divalproex (DEPAKOTE ER) 24 hr tablet 1,500 mg  1,500 mg Oral QHS [name], [name], MD   1,500 mg at 05/26/20 2111  . gabapentin (NEURONTIN) capsule 100 mg  100 mg Oral TID [name], [name], MD   100 mg at 05/27/20 0809  . hydrOXYzine  (ATARAX/VISTARIL) tablet 10 mg  10 mg Oral TID PRN [name], MD   10 mg at 05/27/20 0915  . influenza vac split quadrivalent PF (FLUARIX) injection 0.5 mL  0.5 mL Intramuscular Tomorrow-1000 [name], [name], MD      . lamoTRIgine (LAMICTAL) tablet 150 mg  150 mg Oral QHS Salley Scarlet, MD   150 mg at 05/26/20 2111  . linagliptin (TRADJENTA) tablet 5 mg  5 mg Oral Daily [name], [name], MD   5 mg at 05/27/20 0809  . loperamide (  IMODIUM) capsule 2 mg  2 mg Oral Q4H PRN [name], MD   2 mg at 04/10/20 0917  . loratadine (CLARITIN) tablet 10 mg  10 mg Oral Daily [name], [name], MD   10 mg at 05/27/20 3220  . LORazepam (ATIVAN) tablet 2 mg  2 mg Oral Q6H PRN Salley Scarlet, MD   2 mg at 05/21/20 1614  . magnesium hydroxide (MILK OF MAGNESIA) suspension 30 mL  30 mL Oral Daily PRN [name], MD   30 mL at 03/23/20 1344  . metFORMIN (GLUCOPHAGE) tablet 850 mg  850 mg Oral BID WC [name], [name], MD   850 mg at 05/27/20 0808  . metoprolol succinate (TOPROL-XL) 24 hr tablet 50 mg  50 mg Oral Daily [name], [name], MD   50 mg at 05/27/20 0808  . ondansetron (ZOFRAN) tablet 4 mg  4 mg Oral Q8H PRN [name], [name], MD   4 mg at 04/30/20 2035  . oxybutynin (DITROPAN) tablet 10 mg  10 mg Oral BID [name], [name], MD   10 mg at 05/27/20 0917  . pantoprazole (PROTONIX) EC tablet 40 mg  40 mg Oral Daily [name], [name], MD   40 mg at 05/27/20 0809  . prazosin (MINIPRESS) capsule 1 mg  1 mg Oral QHS [name], [name], MD   1 mg at 05/23/20 2110  . QUEtiapine (SEROQUEL) tablet 100 mg  100 mg Oral QID [name], [name], MD   100 mg at 05/27/20 0809  . QUEtiapine (SEROQUEL) tablet 400 mg  400 mg Oral QHS [name], NP   400 mg at 05/26/20 2112  . sertraline (ZOLOFT) tablet 50 mg  50 mg Oral BID [name], [name], MD   50 mg at 05/27/20 0807  . simethicone (MYLICON) chewable tablet 80 mg  80 mg Oral TID [name], [name], MD   80 mg at 05/27/20 0807  . simvastatin (ZOCOR) tablet 20 mg  20 mg  Oral q1800 [name], [name], MD   20 mg at 05/26/20 1727  . tamsulosin (FLOMAX) capsule 0.8 mg  0.8 mg Oral QPC supper [name], [name], MD   0.8 mg at 05/26/20 1729    Lab Results:  No results found for this or any previous visit (from the past 48 hour(s)).  Blood Alcohol level:  Lab Results  Component Value Date   ETH <10 01/22/2020   ETH <10 25/42/7062    Metabolic Disorder Labs: Lab Results  Component Value Date   HGBA1C 6.6 (H) 02/20/2020   MPG 142.72 02/20/2020   No results found for: PROLACTIN Lab Results  Component Value Date   CHOL 153 04/10/2015   TRIG 166 (H) 04/10/2015   HDL 50 04/10/2015   CHOLHDL 3.1 04/10/2015   VLDL 33 04/10/2015   LDLCALC 70 04/10/2015    Physical Findings: AIMS: Facial and Oral Movements Muscles of Facial Expression: None, normal Lips and Perioral Area: None, normal Jaw: None, normal Tongue: None, normal,Extremity Movements Upper (arms, wrists, hands, fingers): None, normal Lower (legs, knees, ankles, toes): None, normal, Trunk Movements Neck, shoulders, hips: None, normal, Overall Severity Severity of abnormal movements (highest score from questions above): None, normal Incapacitation due to abnormal movements: None, normal Patient's awareness of abnormal movements (rate only patient's report): No Awareness, Dental Status Current problems with teeth and/or dentures?: No Does patient usually wear dentures?: No  CIWA:    COWS:     Musculoskeletal: Strength & Muscle Tone: within normal limits Gait &  Station: normal Patient leans: N/A  Psychiatric Specialty Exam: Physical Exam Vitals and nursing note reviewed.  Constitutional:      Appearance: Normal appearance.  HENT:     Head: Normocephalic and atraumatic.     Right Ear: External ear normal.     Left Ear: External ear normal.     Nose: Nose normal.     Mouth/Throat:     Mouth: Mucous membranes are moist.     Pharynx: Oropharynx is clear.  Eyes:     Extraocular  Movements: Extraocular movements intact.     Conjunctiva/sclera: Conjunctivae normal.     Pupils: Pupils are equal, round, and reactive to light.  Cardiovascular:     Rate and Rhythm: Normal rate.     Pulses: Normal pulses.  Pulmonary:     Effort: Pulmonary effort is normal.     Breath sounds: Normal breath sounds.  Abdominal:     General: Abdomen is flat.     Palpations: Abdomen is soft.  Musculoskeletal:        General: No swelling. Normal range of motion.     Cervical back: Normal range of motion and neck supple.  Skin:    General: Skin is warm and dry.  Neurological:     General: No focal deficit present.     Mental Status: He is alert and oriented to person, place, and time.  Psychiatric:        Attention and Perception: Attention and perception normal.        Mood and Affect: Mood and affect normal.        Speech: Speech normal.        Behavior: Behavior is cooperative.        Thought Content: Thought content is not paranoid. Thought content does not include homicidal or suicidal ideation.        Cognition and Memory: Cognition and memory normal.        Judgment: Judgment is impulsive.     Review of Systems  Constitutional: Negative for activity change and fatigue.  HENT: Negative for rhinorrhea and sore throat.   Eyes: Negative for photophobia and visual disturbance.  Respiratory: Negative for cough and shortness of breath.   Cardiovascular: Negative for chest pain and palpitations.  Gastrointestinal: Negative for constipation, diarrhea, nausea and vomiting.  Endocrine: Negative for cold intolerance and heat intolerance.  Genitourinary: Negative for difficulty urinating and dysuria.  Musculoskeletal: Negative for arthralgias and myalgias.  Skin: Negative for rash and wound.  Allergic/Immunologic: Negative for food allergies and immunocompromised state.  Neurological: Negative for dizziness and headaches.  Hematological: Negative for adenopathy. Does not bruise/bleed  easily.  Psychiatric/Behavioral: Negative for hallucinations and suicidal ideas. The patient is not nervous/anxious.     Blood pressure (!) 145/84, pulse (!) 55, temperature 97.9 F (36.6 C), temperature source Oral, resp. rate 18, height 5\' 8"  (1.727 m), weight 104.3 kg, SpO2 98 %.Body mass index is 34.96 kg/m.  General Appearance: Disheveled  Eye Contact:  Fair  Speech:  Normal rate  Volume:  Normal  Mood:  Euthymic  Affect:  Constricted  Thought Process:  Coherent  Orientation:  Full (Time, Place, and Person)  Thought Content:  Illogical  Suicidal Thoughts:  No  Homicidal Thoughts:  No  Memory:  NA  Judgement:  Impaired  Insight:  Shallow  Psychomotor Activity: wnl  Concentration:  Concentration: Poor and Attention Span: Poor  Recall:  [employer] of Knowledge:  Fair  Language:  Fair  Akathisia:  No  Handed:  Right  AIMS (if indicated):     Assets:  Communication Skills Desire for Improvement  ADL's:  Intact  Cognition:  Impaired,  Mild  Sleep:  Number of Hours: 8     Treatment Plan Summary: Daily contact with patient to assess and evaluate symptoms and progress in treatment and Medication management   Patient is a 64 year old male with the above-stated past psychiatric history who is seen in follow-up.  Chart reviewed. Patient discussed with nursing. Patient appears stable, likely at his mental baseline. He is awaiting placement. Referrals sent by SW.    Plan:  -continue inpatient psych admission; 15-minute checks; daily contact with patient to assess and evaluate symptoms and progress in treatment; psychoeducation.  -continue scheduled medications without changes today. Marland Kitchen aspirin EC  81 mg Oral Daily  . clonazePAM  1 mg Oral QID  . divalproex  1,500 mg Oral QHS  . gabapentin  100 mg Oral TID  . influenza vac split quadrivalent PF  0.5 mL Intramuscular Tomorrow-1000  . lamoTRIgine  150 mg Oral QHS  . linagliptin  5 mg Oral Daily  . loratadine  10 mg Oral  Daily  . metFORMIN  850 mg Oral BID WC  . metoprolol succinate  50 mg Oral Daily  . oxybutynin  10 mg Oral BID  . pantoprazole  40 mg Oral Daily  . prazosin  1 mg Oral QHS  . QUEtiapine  100 mg Oral QID  . QUEtiapine  400 mg Oral QHS  . sertraline  50 mg Oral BID  . simethicone  80 mg Oral TID  . simvastatin  20 mg Oral q1800  . tamsulosin  0.8 mg Oral QPC supper   -continue PRN medications. acetaminophen, alum & mag hydroxide-simeth, hydrOXYzine, loperamide, LORazepam, magnesium hydroxide, ondansetron  -Disposition: to be determined.  Continuing to search for placement.  Salley Scarlet, MD 05/27/2020, 9:29 AM

## 2020-05-27 NOTE — BHH Counselor (Signed)
CSW assisted pt in completing an interview for a group home.  CSW will follow up with the group home for further assistance and identify if the patient has been accepted.  Assunta Curtis, MSW, LCSW 05/27/2020 11:44 AM

## 2020-05-27 NOTE — Progress Notes (Signed)
D- Patient alert and oriented. Affect/mood is pleasant, calm and cooperative. Pt denies SI, HI, AVH, and pain.   A- Scheduled medications administered to patient, per MD orders. Support and encouragement provided.  Routine safety checks conducted every 15 minutes.  Patient informed to notify staff with problems or concerns.  R- No adverse drug reactions noted. Patient contracts for safety at this time. Patient compliant with medications and treatment plan. Patient receptive, calm, and cooperative. Patient interacts well with others on the unit.  Patient remains safe at this time.  Collier Bullock RN

## 2020-05-27 NOTE — Plan of Care (Signed)
Pt denies depression, anxiety and hopelessness. Pt denies SI, HI and AVH. Pt was educated on care plan and verbalizes understanding. Collier Bullock RN Problem: Education: Goal: Knowledge of [place] [facility] Education information/materials will improve Outcome: Progressing Goal: Emotional status will improve Outcome: Progressing Goal: Mental status will improve Outcome: Progressing Goal: Verbalization of understanding the information provided will improve Outcome: Progressing   Problem: Activity: Goal: Interest or engagement in activities will improve Outcome: Progressing Goal: Sleeping patterns will improve Outcome: Progressing   Problem: Coping: Goal: Ability to verbalize frustrations and anger appropriately will improve Outcome: Progressing Goal: Ability to demonstrate self-control will improve Outcome: Progressing   Problem: Health Behavior/Discharge Planning: Goal: Identification of resources available to assist in meeting health care needs will improve Outcome: Progressing Goal: Compliance with treatment plan for underlying cause of condition will improve Outcome: Progressing   Problem: Physical Regulation: Goal: Ability to maintain clinical measurements within normal limits will improve Outcome: Progressing   Problem: Safety: Goal: Periods of time without injury will increase Outcome: Progressing   Problem: Education: Goal: Knowledge of [place] [facility] Education information/materials will improve Outcome: Progressing Goal: Emotional status will improve Outcome: Progressing Goal: Mental status will improve Outcome: Progressing Goal: Verbalization of understanding the information provided will improve Outcome: Progressing   Problem: Activity: Goal: Interest or engagement in activities will improve Outcome: Progressing Goal: Sleeping patterns will improve Outcome: Progressing   Problem: Coping: Goal: Ability to verbalize frustrations and anger  appropriately will improve Outcome: Progressing Goal: Ability to demonstrate self-control will improve Outcome: Progressing   Problem: Health Behavior/Discharge Planning: Goal: Identification of resources available to assist in meeting health care needs will improve Outcome: Progressing Goal: Compliance with treatment plan for underlying cause of condition will improve Outcome: Progressing   Problem: Physical Regulation: Goal: Ability to maintain clinical measurements within normal limits will improve Outcome: Progressing   Problem: Safety: Goal: Periods of time without injury will increase Outcome: Progressing   Problem: Education: Goal: Knowledge of [place] [facility] Education information/materials will improve Outcome: Progressing Goal: Emotional status will improve Outcome: Progressing Goal: Mental status will improve Outcome: Progressing Goal: Verbalization of understanding the information provided will improve Outcome: Progressing   Problem: Activity: Goal: Interest or engagement in activities will improve Outcome: Progressing Goal: Sleeping patterns will improve Outcome: Progressing   Problem: Coping: Goal: Ability to verbalize frustrations and anger appropriately will improve Outcome: Progressing Goal: Ability to demonstrate self-control will improve Outcome: Progressing   Problem: Health Behavior/Discharge Planning: Goal: Identification of resources available to assist in meeting health care needs will improve Outcome: Progressing Goal: Compliance with treatment plan for underlying cause of condition will improve Outcome: Progressing   Problem: Physical Regulation: Goal: Ability to maintain clinical measurements within normal limits will improve Outcome: Progressing   Problem: Safety: Goal: Periods of time without injury will increase Outcome: Progressing   Problem: Education: Goal: Knowledge of  General Education information/materials  will improve Outcome: Progressing Goal: Emotional status will improve Outcome: Progressing Goal: Mental status will improve Outcome: Progressing Goal: Verbalization of understanding the information provided will improve Outcome: Progressing   Problem: Activity: Goal: Interest or engagement in activities will improve Outcome: Progressing Goal: Sleeping patterns will improve Outcome: Progressing   Problem: Coping: Goal: Ability to verbalize frustrations and anger appropriately will improve Outcome: Progressing Goal: Ability to demonstrate self-control will improve Outcome: Progressing   Problem: Health Behavior/Discharge Planning: Goal: Identification of resources available to assist in meeting health care needs will improve Outcome: Progressing Goal: Compliance  with treatment plan for underlying cause of condition will improve Outcome: Progressing   Problem: Physical Regulation: Goal: Ability to maintain clinical measurements within normal limits will improve Outcome: Progressing   Problem: Safety: Goal: Periods of time without injury will increase Outcome: Progressing   Problem: Education: Goal: Ability to make informed decisions regarding treatment will improve Outcome: Progressing   Problem: Coping: Goal: Coping ability will improve Outcome: Progressing   Problem: Health Behavior/Discharge Planning: Goal: Identification of resources available to assist in meeting health care needs will improve Outcome: Progressing   Problem: Medication: Goal: Compliance with prescribed medication regimen will improve Outcome: Progressing   Problem: Self-Concept: Goal: Ability to disclose and discuss suicidal ideas will improve Outcome: Progressing Goal: Will verbalize positive feelings about self Outcome: Progressing

## 2020-05-28 NOTE — Plan of Care (Signed)
Pt denies depression, anxiety, SI, HI and AVH. Pt was educated on care plan and verbalizes understanding. Collier Bullock RN Problem: Education: Goal: Knowledge of [facility] Education information/materials will improve Outcome: Progressing Goal: Emotional status will improve Outcome: Progressing Goal: Mental status will improve Outcome: Progressing Goal: Verbalization of understanding the information provided will improve Outcome: Progressing   Problem: Activity: Goal: Interest or engagement in activities will improve Outcome: Progressing Goal: Sleeping patterns will improve Outcome: Progressing   Problem: Coping: Goal: Ability to verbalize frustrations and anger appropriately will improve Outcome: Progressing Goal: Ability to demonstrate self-control will improve Outcome: Progressing   Problem: Health Behavior/Discharge Planning: Goal: Identification of resources available to assist in meeting health care needs will improve Outcome: Progressing Goal: Compliance with treatment plan for underlying cause of condition will improve Outcome: Progressing   Problem: Physical Regulation: Goal: Ability to maintain clinical measurements within normal limits will improve Outcome: Progressing   Problem: Safety: Goal: Periods of time without injury will increase Outcome: Progressing   Problem: Education: Goal: Knowledge of [facility] Education information/materials will improve Outcome: Progressing Goal: Emotional status will improve Outcome: Progressing Goal: Mental status will improve Outcome: Progressing Goal: Verbalization of understanding the information provided will improve Outcome: Progressing   Problem: Activity: Goal: Interest or engagement in activities will improve Outcome: Progressing Goal: Sleeping patterns will improve Outcome: Progressing   Problem: Coping: Goal: Ability to verbalize frustrations and anger appropriately will improve Outcome:  Progressing Goal: Ability to demonstrate self-control will improve Outcome: Progressing   Problem: Health Behavior/Discharge Planning: Goal: Identification of resources available to assist in meeting health care needs will improve Outcome: Progressing Goal: Compliance with treatment plan for underlying cause of condition will improve Outcome: Progressing   Problem: Physical Regulation: Goal: Ability to maintain clinical measurements within normal limits will improve Outcome: Progressing   Problem: Safety: Goal: Periods of time without injury will increase Outcome: Progressing   Problem: Education: Goal: Knowledge of [facility] Education information/materials will improve Outcome: Progressing Goal: Emotional status will improve Outcome: Progressing Goal: Mental status will improve Outcome: Progressing Goal: Verbalization of understanding the information provided will improve Outcome: Progressing   Problem: Activity: Goal: Interest or engagement in activities will improve Outcome: Progressing Goal: Sleeping patterns will improve Outcome: Progressing   Problem: Coping: Goal: Ability to verbalize frustrations and anger appropriately will improve Outcome: Progressing Goal: Ability to demonstrate self-control will improve Outcome: Progressing   Problem: Health Behavior/Discharge Planning: Goal: Identification of resources available to assist in meeting health care needs will improve Outcome: Progressing Goal: Compliance with treatment plan for underlying cause of condition will improve Outcome: Progressing   Problem: Physical Regulation: Goal: Ability to maintain clinical measurements within normal limits will improve Outcome: Progressing   Problem: Safety: Goal: Periods of time without injury will increase Outcome: Progressing   Problem: Education: Goal: Knowledge of [facility] Education information/materials will improve Outcome:  Progressing Goal: Emotional status will improve Outcome: Progressing Goal: Mental status will improve Outcome: Progressing Goal: Verbalization of understanding the information provided will improve Outcome: Progressing   Problem: Activity: Goal: Interest or engagement in activities will improve Outcome: Progressing Goal: Sleeping patterns will improve Outcome: Progressing   Problem: Coping: Goal: Ability to verbalize frustrations and anger appropriately will improve Outcome: Progressing Goal: Ability to demonstrate self-control will improve Outcome: Progressing   Problem: Health Behavior/Discharge Planning: Goal: Identification of resources available to assist in meeting health care needs will improve Outcome: Progressing Goal: Compliance with treatment plan for  underlying cause of condition will improve Outcome: Progressing   Problem: Physical Regulation: Goal: Ability to maintain clinical measurements within normal limits will improve Outcome: Progressing   Problem: Safety: Goal: Periods of time without injury will increase Outcome: Progressing   Problem: Education: Goal: Ability to make informed decisions regarding treatment will improve Outcome: Progressing   Problem: Coping: Goal: Coping ability will improve Outcome: Progressing   Problem: Health Behavior/Discharge Planning: Goal: Identification of resources available to assist in meeting health care needs will improve Outcome: Progressing   Problem: Medication: Goal: Compliance with prescribed medication regimen will improve Outcome: Progressing   Problem: Self-Concept: Goal: Ability to disclose and discuss suicidal ideas will improve Outcome: Progressing Goal: Will verbalize positive feelings about self Outcome: Progressing

## 2020-05-28 NOTE — Progress Notes (Signed)
[facility] MD Progress Note  05/28/2020 12:44 PM Alexander Beard  MRN:  [id number]  Principal Problem: Schizoaffective disorder, bipolar type ([place]) Diagnosis: Principal Problem:   Schizoaffective disorder, bipolar type ([place]) Active Problems:   Active autistic disorder   Diabetes ([place])   Hypertension   Prostate hypertrophy   Intellectual disability   At risk for elopement  [name] is a 64 y.o. male pt that has a previous psychiatric history of Schizoaffective disorder who presents to the [name] unit for treatment of psychosis.  Interval History Patient was seen today for re-evaluation.  Nursing reports no events overnight. The patient reports no issues with performing ADLs.  Patient has been medication compliant.  The patient reports no side effects from medications.    SUBJECTIVE:  Patient seen one-on-one this morning.  He reports "good" mood; denies feeling depressed. He denies suicidal or homicidal thoughts. He denies any hallucinations at the time of the interview. He denies side effects from his current medications. He denies any physical complaints. He had an interview with group home that went very well yesterday. Mr. Monday feel like this could be a good fit, but was hoping that the group home owner would speak to his brother Alexander Beard before final decision. Social work attempting to set up conference call with brother and group home owner today.   Total Time spent with patient: 30 minutes  Past Psychiatric History: see H&P   Past Medical History:  Past Medical History:  Diagnosis Date  . Anxiety   . Anxiety disorder due to known physiological condition    HOSPITALIZED 10/18  . Arthritis   . Autistic disorder, residual state   . COPD (chronic obstructive pulmonary disease) ([place])   . Depression   . Developmental disorder   . Diabetes mellitus without complication ([place])   . Dyslipidemia   . Esophageal reflux   . HOH (hard of hearing)    MILDLY  . Hypertension   . Obesity   .  Overactive bladder   . Palpitations    ANXIETY  . Schizophrenia, schizoaffective ([place])   . Sleep apnea   . Tremors of nervous system    HANDS DUE TO MEDICATIONS  . Urinary incontinence     Past Surgical History:  Procedure Laterality Date  . CATARACT EXTRACTION W/PHACO Left 11/14/2017   Procedure: CATARACT EXTRACTION PHACO AND INTRAOCULAR LENS PLACEMENT (IOC);  Surgeon: Birder Robson, MD;  Location: [facility] ORS;  Service: Ophthalmology;  Laterality: Left;  [place] 00:42 AP% 14.6 CDE 6.12 Fluid pack lot # [id number]  . COLONOSCOPY  01/28/2005  . COLONOSCOPY WITH PROPOFOL N/A 05/09/2016   Procedure: COLONOSCOPY WITH PROPOFOL;  Surgeon: Manya Silvas, MD;  Location: [facility] ENDOSCOPY;  Service: Endoscopy;  Laterality: N/A;  . ESOPHAGOGASTRODUODENOSCOPY N/A 05/09/2016   Procedure: ESOPHAGOGASTRODUODENOSCOPY (EGD);  Surgeon: Manya Silvas, MD;  Location[facility] ENDOSCOPY;  Service: Endoscopy;  Laterality: N/A;  . FRACTURE SURGERY     ORIF SHOULDER  . TONSILLECTOMY     Family History:  Family History  Problem Relation Age of Onset  . Hypertension Mother   . Stroke Father   . Heart Problems Father    Family Psychiatric  History: see H&P Social History   Substance and Sexual Activity  Alcohol Use No  . Alcohol/week: 0.0 standard drinks     Social History   Substance and Sexual Activity  Drug Use No    Social History   Socioeconomic History  . Marital status: Single    Spouse name: Not on  file  . Number of children: Not on file  . Years of education: Not on file  . Highest education level: Not on file  Occupational History  . Not on file  Tobacco Use  . Smoking status: Never Smoker  . Smokeless tobacco: Never Used  Vaping Use  . Vaping Use: Never used  Substance and Sexual Activity  . Alcohol use: No    Alcohol/week: 0.0 standard drinks  . Drug use: No  . Sexual activity: Never  Other Topics Concern  . Not on file  Social History Narrative   The patient never  finished high school but did get his GED. 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 He is currently in disability and his brother Alexander Beard is his legal guardian. He has been living in group homes for many years.      No pending legal charges   Social Determinants of Health   Financial Resource Strain:   . Difficulty of Paying Living Expenses: Not on file  Food Insecurity:   . Worried About Charity fundraiser in the Last Year: Not on file  . Ran Out of Food in the Last Year: Not on file  Transportation Needs:   . Lack of Transportation (Medical): Not on file  . Lack of Transportation (Non-Medical): Not on file  Physical Activity:   . Days of Exercise per Week: Not on file  . Minutes of Exercise per Session: Not on file  Stress:   . Feeling of Stress : Not on file  Social Connections:   . Frequency of Communication with Friends and Family: Not on file  . Frequency of Social Gatherings with Friends and Family: Not on file  . Attends Religious Services: Not on file  . Active Member of Clubs or Organizations: Not on file  . Attends Archivist Meetings: Not on file  . Marital Status: Not on file   Additional Social History:                         Sleep: Fair  Appetite:  Fair  Current Medications: Current Facility-Administered Medications  Medication Dose Route Frequency Provider Last Rate Last Admin  . acetaminophen (TYLENOL) tablet 650 mg  650 mg Oral Q6H PRN [name], MD   650 mg at 05/14/20 2122  . alum & mag hydroxide-simeth (MAALOX/MYLANTA) 200-200-20 MG/5ML suspension 30 mL  30 mL Oral Q4H PRN [name], MD      . aspirin EC tablet 81 mg  81 mg Oral Daily [name], [name], MD   81 mg at 05/28/20 0824  . clonazePAM (KLONOPIN) tablet 1 mg  1 mg Oral QID [name], [name], MD   1 mg at 05/28/20 1218  . divalproex (DEPAKOTE ER) 24 hr tablet 1,500 mg  1,500 mg Oral QHS [name], [name], MD    1,500 mg at 05/27/20 2115  . gabapentin (NEURONTIN) capsule 100 mg  100 mg Oral TID [name], [name], MD   100 mg at 05/28/20 1220  . hydrOXYzine (ATARAX/VISTARIL) tablet 10 mg  10 mg Oral TID PRN [name], MD   10 mg at 05/27/20 0915  . influenza vac split quadrivalent PF (FLUARIX) injection 0.5 mL  0.5 mL Intramuscular Tomorrow-1000 [name], [name], MD      . lamoTRIgine (LAMICTAL) tablet 150 mg  150 mg Oral QHS Salley Scarlet, MD   150  mg at 05/27/20 2115  . linagliptin (TRADJENTA) tablet 5 mg  5 mg Oral Daily [name], [name], MD   5 mg at 05/28/20 0824  . loperamide (IMODIUM) capsule 2 mg  2 mg Oral Q4H PRN [name], MD   2 mg at 04/10/20 0917  . loratadine (CLARITIN) tablet 10 mg  10 mg Oral Daily [name], [name], MD   10 mg at 05/28/20 0824  . LORazepam (ATIVAN) tablet 2 mg  2 mg Oral Q6H PRN Salley Scarlet, MD   2 mg at 05/21/20 1614  . magnesium hydroxide (MILK OF MAGNESIA) suspension 30 mL  30 mL Oral Daily PRN [name], MD   30 mL at 03/23/20 1344  . metFORMIN (GLUCOPHAGE) tablet 850 mg  850 mg Oral BID WC [name], [name], MD   850 mg at 05/28/20 0825  . metoprolol succinate (TOPROL-XL) 24 hr tablet 50 mg  50 mg Oral Daily [name], [name], MD   50 mg at 05/28/20 0825  . ondansetron (ZOFRAN) tablet 4 mg  4 mg Oral Q8H PRN [name], [name], MD   4 mg at 04/30/20 2035  . oxybutynin (DITROPAN) tablet 10 mg  10 mg Oral BID [name], [name], MD   10 mg at 05/28/20 0825  . pantoprazole (PROTONIX) EC tablet 40 mg  40 mg Oral Daily [name], [name], MD   40 mg at 05/28/20 0824  . prazosin (MINIPRESS) capsule 1 mg  1 mg Oral QHS [name], [name], MD   1 mg at 05/27/20 2115  . QUEtiapine (SEROQUEL) tablet 100 mg  100 mg Oral QID [name], [name], MD   100 mg at 05/28/20 1220  . QUEtiapine (SEROQUEL) tablet 400 mg  400 mg Oral QHS [name], NP   400 mg at 05/27/20 2116  . sertraline (ZOLOFT) tablet 50 mg  50 mg Oral BID [name], [name], MD   50 mg at 05/28/20 0825  .  simethicone (MYLICON) chewable tablet 80 mg  80 mg Oral TID [name], [name], MD   80 mg at 05/28/20 1218  . simvastatin (ZOCOR) tablet 20 mg  20 mg Oral q1800 [name], [name], MD   20 mg at 05/27/20 1722  . tamsulosin (FLOMAX) capsule 0.8 mg  0.8 mg Oral QPC supper [name], [name], MD   0.8 mg at 05/27/20 1903    Lab Results:  No results found for this or any previous visit (from the past 48 hour(s)).  Blood Alcohol level:  Lab Results  Component Value Date   ETH <10 01/22/2020   ETH <10 67/54/4920    Metabolic Disorder Labs: Lab Results  Component Value Date   HGBA1C 6.6 (H) 02/20/2020   MPG 142.72 02/20/2020   No results found for: PROLACTIN Lab Results  Component Value Date   CHOL 153 04/10/2015   TRIG 166 (H) 04/10/2015   HDL 50 04/10/2015   CHOLHDL 3.1 04/10/2015   VLDL 33 04/10/2015   LDLCALC 70 04/10/2015    Physical Findings: AIMS: Facial and Oral Movements Muscles of Facial Expression: None, normal Lips and Perioral Area: None, normal Jaw: None, normal Tongue: None, normal,Extremity Movements Upper (arms, wrists, hands, fingers): None, normal Lower (legs, knees, ankles, toes): None, normal, Trunk Movements Neck, shoulders, hips: None, normal, Overall Severity Severity of abnormal movements (highest score from questions above): None, normal Incapacitation due to abnormal movements: None, normal Patient's awareness of abnormal movements (rate only patient's report): No Awareness, Dental Status Current problems  with teeth and/or dentures?: No Does patient usually wear dentures?: No  CIWA:    COWS:     Musculoskeletal: Strength & Muscle Tone: within normal limits Gait & Station: normal Patient leans: N/A  Psychiatric Specialty Exam: Physical Exam Vitals and nursing note reviewed.  Constitutional:      Appearance: Normal appearance.  HENT:     Head: Normocephalic and atraumatic.     Right Ear: External ear normal.     Left Ear: External ear normal.      Nose: Nose normal.     Mouth/Throat:     Mouth: Mucous membranes are moist.     Pharynx: Oropharynx is clear.  Eyes:     Extraocular Movements: Extraocular movements intact.     Conjunctiva/sclera: Conjunctivae normal.     Pupils: Pupils are equal, round, and reactive to light.  Cardiovascular:     Rate and Rhythm: Normal rate.     Pulses: Normal pulses.  Pulmonary:     Effort: Pulmonary effort is normal.     Breath sounds: Normal breath sounds.  Abdominal:     General: Abdomen is flat.     Palpations: Abdomen is soft.  Musculoskeletal:        General: No swelling. Normal range of motion.     Cervical back: Normal range of motion and neck supple.  Skin:    General: Skin is warm and dry.  Neurological:     General: No focal deficit present.     Mental Status: He is alert and oriented to person, place, and time.  Psychiatric:        Attention and Perception: Attention and perception normal.        Mood and Affect: Mood and affect normal.        Speech: Speech normal.        Behavior: Behavior is cooperative.        Thought Content: Thought content is not paranoid. Thought content does not include homicidal or suicidal ideation.        Cognition and Memory: Cognition and memory normal.        Judgment: Judgment is impulsive.     Review of Systems  Constitutional: Negative for activity change and fatigue.  HENT: Negative for rhinorrhea and sore throat.   Eyes: Negative for photophobia and visual disturbance.  Respiratory: Negative for cough and shortness of breath.   Cardiovascular: Negative for chest pain and palpitations.  Gastrointestinal: Negative for constipation, diarrhea, nausea and vomiting.  Endocrine: Negative for cold intolerance and heat intolerance.  Genitourinary: Negative for difficulty urinating and dysuria.  Musculoskeletal: Negative for arthralgias and myalgias.  Skin: Negative for rash and wound.  Allergic/Immunologic: Negative for food allergies and  immunocompromised state.  Neurological: Negative for dizziness and headaches.  Hematological: Negative for adenopathy. Does not bruise/bleed easily.  Psychiatric/Behavioral: Negative for hallucinations and suicidal ideas. The patient is not nervous/anxious.     Blood pressure 129/75, pulse 73, temperature 98.5 F (36.9 C), resp. rate 18, height 5\' 8"  (1.727 m), weight 104.3 kg, SpO2 95 %.Body mass index is 34.96 kg/m.  General Appearance: Disheveled  Eye Contact:  Fair  Speech:  Normal rate  Volume:  Normal  Mood:  Euthymic  Affect:  Constricted  Thought Process:  Coherent  Orientation:  Full (Time, Place, and Person)  Thought Content:  Illogical  Suicidal Thoughts:  No  Homicidal Thoughts:  No  Memory:  NA  Judgement:  Impaired  Insight:  Shallow  Psychomotor Activity: wnl  Concentration:  Concentration: Poor and Attention Span: Poor  Recall:  [employer] of Knowledge:  Fair  Language:  Fair  Akathisia:  No  Handed:  Right  AIMS (if indicated):     Assets:  Communication Skills Desire for Improvement  ADL's:  Intact  Cognition:  Impaired,  Mild  Sleep:  Number of Hours: 8     Treatment Plan Summary: Daily contact with patient to assess and evaluate symptoms and progress in treatment and Medication management   Patient is a 64 year old male with the above-stated past psychiatric history who is seen in follow-up.  Chart reviewed. Patient discussed with nursing. Patient appears stable, likely at his mental baseline. He is awaiting placement. Referrals sent by SW.    Plan:  -continue inpatient psych admission; 15-minute checks; daily contact with patient to assess and evaluate symptoms and progress in treatment; psychoeducation.  -continue scheduled medications without changes today. Marland Kitchen aspirin EC  81 mg Oral Daily  . clonazePAM  1 mg Oral QID  . divalproex  1,500 mg Oral QHS  . gabapentin  100 mg Oral TID  . influenza vac split quadrivalent PF  0.5 mL Intramuscular  Tomorrow-1000  . lamoTRIgine  150 mg Oral QHS  . linagliptin  5 mg Oral Daily  . loratadine  10 mg Oral Daily  . metFORMIN  850 mg Oral BID WC  . metoprolol succinate  50 mg Oral Daily  . oxybutynin  10 mg Oral BID  . pantoprazole  40 mg Oral Daily  . prazosin  1 mg Oral QHS  . QUEtiapine  100 mg Oral QID  . QUEtiapine  400 mg Oral QHS  . sertraline  50 mg Oral BID  . simethicone  80 mg Oral TID  . simvastatin  20 mg Oral q1800  . tamsulosin  0.8 mg Oral QPC supper   -continue PRN medications. acetaminophen, alum & mag hydroxide-simeth, hydrOXYzine, loperamide, LORazepam, magnesium hydroxide, ondansetron  -Disposition: to be determined.  Continuing to search for placement.  Salley Scarlet, MD 05/28/2020, 12:44 PM

## 2020-05-28 NOTE — Progress Notes (Signed)
D- Patient alert and oriented. Affect/mood is calm and cooperative. . Pt denies SI, HI, AVH, and pain.   A- Scheduled medications administered to patient, per MD orders. Support and encouragement provided.  Routine safety checks conducted every 15 minutes.  Patient informed to notify staff with problems or concerns.  R- No adverse drug reactions noted. Patient contracts for safety at this time. Patient compliant with medications and treatment plan. Patient receptive, calm, and cooperative. Patient interacts well with others on the unit.  Patient remains safe at this time.  Collier Bullock RN

## 2020-05-28 NOTE — BHH Counselor (Addendum)
CSW attempted twice to call Seward Carol, [phone], however both calls were unanswered and CSW left HIPAA compliant voicemail.  Assunta Curtis, MSW, LCSW 05/28/2020 3:08 PM

## 2020-05-28 NOTE — BHH Counselor (Signed)
CSW contacted [facility] [phone] to speak on the patient's housing situation. CSW was advised to call Ivin Booty at [phone].  CSW called and was unable to speak to anyone or leave a VM.  CSW called the main number back and was informed that they would reach out to [facility] and have her call this CSW, however, in the meantime CSW could call Clinical Director, Morene Rankins. Hoyle Sauer reports that she was not well aware of the process and advised CSW call Ivin Booty.   CSW did receive call from Ivin Booty who reports that there is a consumer who is in line before pt.  She reports that there is not a timeline to provide as they are awaiting HUD paperwork for the consumer to determine if the patient can be approved or denied.  She reports that paperwork may not be complete until December.  She recommends that if patient has a bed offer at another facility that CSW pursue that bed, as she is not sure of time.  She also reports that she is not sure that patient will be approved as pts brother provided report on pt's behaviors, which were the same that had patient removed from the home in the past.    Assunta Curtis, MSW, LCSW 05/28/2020 3:16 PM

## 2020-05-28 NOTE — BHH Counselor (Signed)
CSW spoke with Roslyn Smiling, Care Coordinator, [phone].  He reports that he has had no luck towards placing patient. CSW updated on the patient's 2 interviews, with Mr. Aldean Ast at [facility] and Mr. Humphries.  CSW informed that Mr Reece Agar is likely to offer bed, however pt's brother may not be aligned with this plan.    Aaron Edelman recommended that CSW call the home the brother would like for the patient to go to for some clarity.   He asked for a follow up call with any updates.   Assunta Curtis, MSW, LCSW 05/28/2020 12:17 PM

## 2020-05-28 NOTE — BHH Counselor (Addendum)
CSW contacted brother, Alexander Beard. Regarding the name of the group home with which he had been in contact. He stated that his brother had been in a men's home through [place] in the past called [facility]. He states that it is under the direction of Ron Osburn and managed by Ivin Booty. CSW was given contact number of [phone]. Alexander Beard inquired about how his brother was doing today. CSW informed him that there has been no bad news given today in report. No other concerns expressed. Contact ended without incident.   Chalmers Guest. [name], MSW, LCSW, [place] 05/28/2020 1:17 PM

## 2020-05-28 NOTE — Progress Notes (Addendum)
Recreation Therapy Notes  Date: 05/28/2020  Time: 9:30 am   Location: Craft room  Behavioral response: Appropriate  Intervention Topic: Goals   Discussion/Intervention:  Group content on today was focused on goals. Patients described what goals are and how they define goals. Individuals expressed how they go about setting goals and reaching them. The group identified how important goals are and if they make short term goals to reach long term goals. Patients described how many goals they work on at a time and what affects them not reaching their goal. Individuals described how much time they put into planning and obtaining their goals. The group participated in the intervention "My Goal Board" and made personal goal boards to help them achieve their goal. Clinical Observations/Feedback: Patient came to group and defined goals as things you want to achieve in the future. He stated that his goals are to take care of himself and control his emotions. Participant explained that goals are important make wise decisions for the future. Individual was social with peers and staff while participating in the intervention.  Emersyn Kotarski LRT/CTRS         Guynell Kleiber 05/28/2020 12:02 PM

## 2020-05-28 NOTE — [facility] Group Notes (Signed)
LCSW Group Therapy Note  05/28/2020 3:08 PM  Type of Therapy/Topic:  Group Therapy:  Balance in Life  Participation Level:  Active  Description of Group:    This group will address the concept of balance and how it feels and looks when one is unbalanced. Patients will be encouraged to process areas in their lives that are out of balance and identify reasons for remaining unbalanced. Facilitators will guide patients in utilizing problem-solving interventions to address and correct the stressor making their life unbalanced. Understanding and applying boundaries will be explored and addressed for obtaining and maintaining a balanced life. Patients will be encouraged to explore ways to assertively make their unbalanced needs known to significant others in their lives, using other group members and facilitator for support and feedback.  Therapeutic Goals: 1. Patient will identify two or more emotions or situations they have that consume much of in their lives. 2. Patient will identify signs/triggers that life has become out of balance:  3. Patient will identify two ways to set boundaries in order to achieve balance in their lives:  4. Patient will demonstrate ability to communicate their needs through discussion and/or role plays  Summary of Patient Progress: Patient was an active participant in group.  He reports that balance is important "so things wont get out of control".  Patient was active in discussion on boundaries and how that allows for healthy balance.  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    Therapeutic Modalities:   Cognitive Behavioral Therapy Solution-Focused Therapy Assertiveness Training  Assunta Curtis MSW, LCSW 05/28/2020 3:08 PM

## 2020-05-29 NOTE — Tx Team (Signed)
Interdisciplinary Treatment and Diagnostic Plan Update  05/29/2020 Time of Session: 8:30 AM  OZZIE KNOBEL MRN: [id number]  Principal Diagnosis: Schizoaffective disorder, bipolar type ([place])  Secondary Diagnoses: Principal Problem:   Schizoaffective disorder, bipolar type ([place]) Active Problems:   Active autistic disorder   Diabetes ([place])   Hypertension   Prostate hypertrophy   Intellectual disability   At risk for elopement   Current Medications:  Current Facility-Administered Medications  Medication Dose Route Frequency Provider Last Rate Last Admin  . acetaminophen (TYLENOL) tablet 650 mg  650 mg Oral Q6H PRN [name], MD   650 mg at 05/14/20 2122  . alum & mag hydroxide-simeth (MAALOX/MYLANTA) 200-200-20 MG/5ML suspension 30 mL  30 mL Oral Q4H PRN [name], MD      . aspirin EC tablet 81 mg  81 mg Oral Daily [name], [name], MD   81 mg at 05/29/20 0824  . clonazePAM (KLONOPIN) tablet 1 mg  1 mg Oral QID [name], [name], MD   1 mg at 05/29/20 0824  . divalproex (DEPAKOTE ER) 24 hr tablet 1,500 mg  1,500 mg Oral QHS [name], [name], MD   1,500 mg at 05/28/20 2122  . gabapentin (NEURONTIN) capsule 100 mg  100 mg Oral TID [name], [name], MD   100 mg at 05/29/20 0824  . hydrOXYzine (ATARAX/VISTARIL) tablet 10 mg  10 mg Oral TID PRN [name], MD   10 mg at 05/27/20 0915  . influenza vac split quadrivalent PF (FLUARIX) injection 0.5 mL  0.5 mL Intramuscular Tomorrow-1000 [name], [name], MD      . lamoTRIgine (LAMICTAL) tablet 150 mg  150 mg Oral QHS Salley Scarlet, MD   150 mg at 05/28/20 2121  . linagliptin (TRADJENTA) tablet 5 mg  5 mg Oral Daily [name], [name], MD   5 mg at 05/29/20 0824  . loperamide (IMODIUM) capsule 2 mg  2 mg Oral Q4H PRN [name], MD   2 mg at 04/10/20 0917  . loratadine (CLARITIN) tablet 10 mg  10 mg Oral Daily [name], [name], MD   10 mg at 05/29/20 0824  . LORazepam (ATIVAN) tablet 2 mg  2 mg Oral Q6H PRN Salley Scarlet, MD    2 mg at 05/21/20 1614  . magnesium hydroxide (MILK OF MAGNESIA) suspension 30 mL  30 mL Oral Daily PRN [name], MD   30 mL at 03/23/20 1344  . metFORMIN (GLUCOPHAGE) tablet 850 mg  850 mg Oral BID WC [name], [name], MD   850 mg at 05/29/20 0824  . metoprolol succinate (TOPROL-XL) 24 hr tablet 50 mg  50 mg Oral Daily [name], [name], MD   50 mg at 05/29/20 0827  . ondansetron (ZOFRAN) tablet 4 mg  4 mg Oral Q8H PRN [name], [name], MD   4 mg at 04/30/20 2035  . oxybutynin (DITROPAN) tablet 10 mg  10 mg Oral BID [name], [name], MD   10 mg at 05/29/20 0824  . pantoprazole (PROTONIX) EC tablet 40 mg  40 mg Oral Daily [name], [name], MD   40 mg at 05/29/20 0824  . prazosin (MINIPRESS) capsule 1 mg  1 mg Oral QHS [name], [name], MD   1 mg at 05/28/20 2122  . QUEtiapine (SEROQUEL) tablet 100 mg  100 mg Oral QID [name], [name], MD   100 mg at 05/29/20 0824  . QUEtiapine (SEROQUEL) tablet 400 mg  400 mg Oral QHS [name], NP  400 mg at 05/28/20 2122  . sertraline (ZOLOFT) tablet 50 mg  50 mg Oral BID [name], [name], MD   50 mg at 05/29/20 0824  . simethicone (MYLICON) chewable tablet 80 mg  80 mg Oral TID [name], [name], MD   80 mg at 05/29/20 0824  . simvastatin (ZOCOR) tablet 20 mg  20 mg Oral q1800 [name], [name], MD   20 mg at 05/28/20 1736  . tamsulosin (FLOMAX) capsule 0.8 mg  0.8 mg Oral QPC supper [name], [name], MD   0.8 mg at 05/28/20 1736   PTA Medications: Medications Prior to Admission  Medication Sig Dispense Refill Last Dose  . acetaminophen (TYLENOL) 650 MG CR tablet Take 650 mg by mouth every 12 (twelve) hours as needed for pain.     Marland Kitchen aspirin EC 81 MG tablet Take 81 mg by mouth daily.     . Calcium Carbonate-Vitamin D3 (CALCIUM 600-D) 600-400 MG-UNIT TABS Take 1 tablet by mouth 2 (two) times daily.     . Cholecalciferol (VITAMIN D-1000 MAX ST) 1000 UNITS tablet Take 1,000 Units by mouth daily.      Marland Kitchen docusate sodium (COLACE) 100 MG capsule Take 100 mg by  mouth daily.      . furosemide (LASIX) 20 MG tablet Take 20 mg by mouth daily.      Marland Kitchen gabapentin (NEURONTIN) 100 MG capsule Take 1 capsule (100 mg total) by mouth 3 (three) times daily. 90 capsule 10   . Insulin Detemir (LEVEMIR FLEXTOUCH) 100 UNIT/ML Pen Inject 15 Units into the skin daily at 10 pm.     . lamoTRIgine (LAMICTAL) 100 MG tablet Take 1 tablet (100 mg total) by mouth at bedtime. 30 tablet 10   . loratadine (CLARITIN) 10 MG tablet Take 1 tablet (10 mg total) by mouth daily. 30 tablet 5   . LORazepam (ATIVAN) 1 MG tablet Take 1 tablet (1 mg total) by mouth 3 (three) times daily. 90 tablet 5   . lurasidone (LATUDA) 40 MG TABS tablet Take 1 tablet (40 mg total) by mouth daily. with food 30 tablet 10   . metoprolol succinate (TOPROL-XL) 50 MG 24 hr tablet Take 50 mg by mouth daily.      . NON FORMULARY cpap device     . oxybutynin (DITROPAN) 5 MG tablet Take 5 mg by mouth daily.     . pantoprazole (PROTONIX) 40 MG tablet Take 40 mg by mouth daily.     . polyethylene glycol (MIRALAX / GLYCOLAX) packet Take 17 g by mouth daily.     . QUEtiapine (SEROQUEL) 100 MG tablet TAKE 1 TABLET  BY MOUTH THREE TIMES A DAY (Patient taking differently: Take 100 mg by mouth 3 (three) times daily. TAKE 1 TABLET  BY MOUTH THREE TIMES A DAY) 90 tablet 11   . QUEtiapine (SEROQUEL) 400 MG tablet Take 1 tablet (400 mg total) by mouth at bedtime. 30 tablet 10   . sertraline (ZOLOFT) 100 MG tablet Take 2 tablets (200 mg total) by mouth daily. (Patient taking differently: Take 200 mg by mouth daily. Take along with two 50 mg tablets (100 mg) for total 300 mg daily) 60 tablet 5   . sertraline (ZOLOFT) 50 MG tablet Take 2 tablets (100 mg total) by mouth daily. (Patient taking differently: Take 100 mg by mouth daily. Take along with two 100 mg tablets (200 mg) for total 300 mg daily) 60 tablet 5   . simethicone (MYLICON) 80 MG chewable tablet  Chew 80-160 mg by mouth 2 (two) times daily as needed for flatulence.     .  simvastatin (ZOCOR) 20 MG tablet Take 20 mg by mouth at bedtime.      . sitaGLIPtin (JANUVIA) 100 MG tablet Take 100 mg by mouth daily.     . solifenacin (VESICARE) 5 MG tablet Take 5 mg by mouth daily.     . Starch (HEMORRHOIDAL RE) Place 1 application rectally 4 (four) times daily as needed (for itching).     . tamsulosin (FLOMAX) 0.4 MG CAPS capsule Take 0.4 mg by mouth daily.       Patient Stressors: Health problems Marital or family conflict Medication change or noncompliance Traumatic event  Patient Strengths: Motivation for treatment/growth Religious Affiliation  Treatment Modalities: Medication Management, Group therapy, Case management,  1 to 1 session with clinician, Psychoeducation, Recreational therapy.   Physician Treatment Plan for Primary Diagnosis: Schizoaffective disorder, bipolar type ([place]) Long Term Goal(s): Improvement in symptoms so as ready for discharge Improvement in symptoms so as ready for discharge   Short Term Goals: Ability to verbalize feelings will improve Ability to demonstrate self-control will improve Ability to maintain clinical measurements within normal limits will improve Compliance with prescribed medications will improve  Medication Management: Evaluate patient's response, side effects, and tolerance of medication regimen.  Therapeutic Interventions: 1 to 1 sessions, Unit Group sessions and Medication administration.  Evaluation of Outcomes: Progressing  Physician Treatment Plan for Secondary Diagnosis: Principal Problem:   Schizoaffective disorder, bipolar type ([place]) Active Problems:   Active autistic disorder   Diabetes ([place])   Hypertension   Prostate hypertrophy   Intellectual disability   At risk for elopement  Long Term Goal(s): Improvement in symptoms so as ready for discharge Improvement in symptoms so as ready for discharge   Short Term Goals: Ability to verbalize feelings will improve Ability to demonstrate self-control  will improve Ability to maintain clinical measurements within normal limits will improve Compliance with prescribed medications will improve     Medication Management: Evaluate patient's response, side effects, and tolerance of medication regimen.  Therapeutic Interventions: 1 to 1 sessions, Unit Group sessions and Medication administration.  Evaluation of Outcomes: Progressing   RN Treatment Plan for Primary Diagnosis: Schizoaffective disorder, bipolar type ([place]) Long Term Goal(s): Knowledge of disease and therapeutic regimen to maintain health will improve  Short Term Goals: Ability to demonstrate self-control, Ability to participate in decision making will improve, Ability to verbalize feelings will improve, Ability to identify and develop effective coping behaviors will improve and Compliance with prescribed medications will improve  Medication Management: RN will administer medications as ordered by provider, will assess and evaluate patient's response and provide education to patient for prescribed medication. RN will report any adverse and/or side effects to prescribing provider.  Therapeutic Interventions: 1 on 1 counseling sessions, Psychoeducation, Medication administration, Evaluate responses to treatment, Monitor vital signs and CBGs as ordered, Perform/monitor CIWA, COWS, AIMS and Fall Risk screenings as ordered, Perform wound care treatments as ordered.  Evaluation of Outcomes: Progressing   LCSW Treatment Plan for Primary Diagnosis: Schizoaffective disorder, bipolar type ([place]) Long Term Goal(s): Safe transition to appropriate next level of care at discharge, Engage patient in therapeutic group addressing interpersonal concerns.  Short Term Goals: Engage patient in aftercare planning with referrals and resources, Increase social support, Increase ability to appropriately verbalize feelings, Increase emotional regulation and Increase skills for wellness and  recovery  Therapeutic Interventions: Assess for all discharge needs, 1 to 1 time  with Social worker, Explore available resources and support systems, Assess for adequacy in community support network, Educate family and significant other(s) on suicide prevention, Complete Psychosocial Assessment, Interpersonal group therapy.  Evaluation of Outcomes: Progressing   Progress in Treatment: Attending groups: Yes. Participating in groups: Yes. Taking medication as prescribed: Yes. Toleration medication: Yes. Family/Significant other contact made: Yes, individual(s) contacted:  [place], brother  Patient understands diagnosis: Yes. Discussing patient identified problems/goals with staff: Yes. Medical problems stabilized or resolved: Yes. Denies suicidal/homicidal ideation: Yes. Issues/concerns per patient self-inventory: No. Other: N/A  New problem(s) identified: No, Describe:  None   New Short Term/Long Term Goal(s): Elimination of symptoms of psychosis, medication management for mood stabilization; elimination of SI thoughts; development of comprehensive mental wellness/sobriety plan.Update 03/01/20:No changes at this time. 03/07/20: Update, no changes at this time Update 03/12/2020: No changes at this time.Update 03/17/2020:No changes at this time. Update 03/21/20:No changes at this time. Update 03/26/20: No changes at this time. Update 03/31/2020: No changes at this time.Update 04/04/20: No changes at this time Update 04/09/2020: No changes at this time. Update 04/14/2020: No changes at this time. Update 04/20/2020:No changes at this time. Update: 04/25/2020: No changes at this time. Update 04/30/2020: No changes at this time.Update 05/05/2020: No changes at this time. Update 05/09/20: No changes at this time. Update 05/14/20: No changes at this time.Update 05/19/2020: No changes at this time. Update 05/23/20: No changes at this time. Update 05/29/20:  No changes at this time.   Patient  Goals:  Patient stated he would like to go back to [place] and reconnect with his peer support. Patient also stated that he would like to increase his social support and help his brother.Update 03/07/20,no change Update 03/12/2020: No changes at this time. Update 03/17/2020:No changes at this time.Update 03/21/20: No changes at this time. Update 03/26/20: No changes at this time. Update 03/31/2020: No changes at this time. Update9/25/21:No changes at this time. Update 04/09/2020: No changes at this time. Update 04/14/2020: No changes at this time. Update 04/20/2020:No changes at this time. 04/25/2020: Patient still indicates that he would like to go back to his old ways and life. Patient would like to get back into the community. 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 Update 04/30/2020: No changes at this time.Update 05/05/2020: No changes at this time. Patient maintains that he wants to get back to his old way of life, volunteer in the community, do his artwork, and help his brother. Update 05/09/20: No changes at this time. Update 05/14/20: No changes at this time.  Update 05/19/2020: No changes at this time. Update 05/23/20: No changes at this time. Update 05/29/20: No changes at this time.    Discharge Plan or Barriers: Patient has an interview with a group home on 03/09/20 at 2:00 PM. Update 03/12/2020: CSW continues to assist the patient in developing a housing plan.Update 03/17/2020:CSW has assisted patient in completing an interview with a group home, however, there has been no word on if the patient has been approved. Patient continues to struggle with his anxiety and nerves. Nurses report that he has increased in bed wetting. Psychiatrist and nurses believe that this may be related to his increased anxiety about finding a home.Update 03/21/20: Patient is still nervous and anxious about finding a new placement. Patient stated he could not stay here too much longer and wanted his life back.  Update 03/26/20: CSW continues to look for placement for pt. Group Homes, Assisted Living Facilities and Family  Care Homes are being considered. Update 03/31/2020: Patient continues to have interview with group homes, however, no success at this time in identifying a home that has accepted him.Update: 04/04/20:No changes at this time. Update 04/09/2020: CSW continues to look for placement for patient. Update 04/14/2020: No changes at this time. CSW continues to look for placement. Update 04/20/2020:CSW continues to assist with placement needs.04/25/2020: CSW continues to look for placement options. Update 04/30/2020: CSW continues to look for placement for the patient. Patient has been declined for 17 SNF's however, there are a quite a few where the referral is pending. CSW has contacted several group homes, however, none have selected the patient. Update 05/05/2020: CSW continues to look for placement for the patient. Patient has been declined at 25 SNF's with quite a few referrals left pending. CSW also sent out referrals for memory care facilities. CSW continues to contact groups homes without any selecting the patient at the moment. Update 05/09/20: CSW will continue to find appropriate placement for patient. Update 05/14/20: CSW will continue to find appropriate placement for patient.  Update 05/19/2020: CSW will continue to assist patient in identifying appropriate placement. Update 05/23/2020: CSW will continue to assist patient in identifying appropriate placement. Update 05/29/20: CSW has found group home placement. Pt brother would like to speak to group home owner and states he has been in contact with [facility] regarding the [facility]. CSW has verified that [facility] does not have a guaranteed bed for pt and was encouraged to go with group home which accepted pt. As of this moment, CSW has been unable to reach brother to facilitate conference call between group home and brother.   Reason  for Continuation of Hospitalization: Depression Medication stabilization  Estimated Length of Stay: TBD  Attendees: Patient:  05/29/2020 10:29 AM  Physician: Selina Cooley, MD 05/29/2020 10:29 AM  Nursing:  05/29/2020 10:29 AM  RN Care Manager: 05/29/2020 10:29 AM  Social Worker: Assunta Curtis, MSW, LCSW 05/29/2020 10:29 AM  Recreational Therapist:  05/29/2020 10:29 AM  Other: Paulla Dolly, Latanya Presser, MSW  05/29/2020 10:29 AM  Other: Chalmers Guest. [name], MSW, [place], [place] 05/29/2020 10:29 AM  Other: 05/29/2020 10:29 AM    Scribe for Treatment Team: Shirl Harris, LCSW 05/29/2020 10:29 AM

## 2020-05-29 NOTE — Progress Notes (Signed)
Recreation Therapy Notes  Date: 05/29/2020  Time: 9:30 am   Location: Craft room  Behavioral response: Appropriate  Intervention Topic: Problem Solving  Discussion/Intervention:  Group content on today was focused on problem solving. The group described what problem solving is. Patients expressed how problems affect them and how they deal with problems. Individuals identified healthy ways to deal with problems. Patients explained what normally happens to them when they do not deal with problems. The group expressed reoccurring problems for them. The group participated in the intervention "Ways to Solve problems" where patients were given a chance to explore different ways to solve problems.  Clinical Observations/Feedback: Patient came to group and defined problem solving as adjusting to a new environment. He expressed that problem solving is important to learn to improve. Individual was social with peers and staff while participating in the intervention.  Alexander Beard LRT/CTRS         Alexander Beard 05/29/2020 12:18 PM

## 2020-05-29 NOTE — Plan of Care (Signed)
Patient is appropriate with staff & peers.Denies SI,HI and AVH.ADLs maintained.Compliant with medications.Appetite and energy level good.Attended groups.Support and encouragement given.

## 2020-05-29 NOTE — Progress Notes (Signed)
[facility] MD Progress Note  05/29/2020 2:43 PM Alexander Beard  MRN:  [id number]  Principal Problem: Schizoaffective disorder, bipolar type ([place]) Diagnosis: Principal Problem:   Schizoaffective disorder, bipolar type ([place]) Active Problems:   Active autistic disorder   Diabetes ([place])   Hypertension   Prostate hypertrophy   Intellectual disability   At risk for elopement  Alexander Beard is a 64 y.o. male pt that has a previous psychiatric history of Schizoaffective disorder who presents to the [facility] unit for treatment of psychosis.  Interval History Patient was seen today for re-evaluation.  Nursing reports no events overnight. The patient reports no issues with performing ADLs.  Patient has been medication compliant.  The patient reports no side effects from medications.    SUBJECTIVE:  Patient seen one-on-one this morning.  He reports "good" mood; denies feeling depressed. He denies suicidal or homicidal thoughts. He denies any hallucinations at the time of the interview. He denies side effects from his current medications. He denies any physical complaints. He had an interview with group home that went very well. Group home owner and brother were able to speak today. Brother attempting to arrange visit to group home.   Total Time spent with patient: 30 minutes  Past Psychiatric History: see H&P   Past Medical History:  Past Medical History:  Diagnosis Date  . Anxiety   . Anxiety disorder due to known physiological condition    HOSPITALIZED 10/18  . Arthritis   . Autistic disorder, residual state   . COPD (chronic obstructive pulmonary disease) ([place])   . Depression   . Developmental disorder   . Diabetes mellitus without complication ([place])   . Dyslipidemia   . Esophageal reflux   . HOH (hard of hearing)    MILDLY  . Hypertension   . Obesity   . Overactive bladder   . Palpitations    ANXIETY  . Schizophrenia, schizoaffective ([place])   . Sleep apnea   . Tremors of nervous system     HANDS DUE TO MEDICATIONS  . Urinary incontinence     Past Surgical History:  Procedure Laterality Date  . CATARACT EXTRACTION W/PHACO Left 11/14/2017   Procedure: CATARACT EXTRACTION PHACO AND INTRAOCULAR LENS PLACEMENT (IOC);  Surgeon: Birder Robson, MD;  Location: [facility] ORS;  Service: Ophthalmology;  Laterality: Left;  [place] 00:42 AP% 14.6 CDE 6.12 Fluid pack lot # [id number]  . COLONOSCOPY  01/28/2005  . COLONOSCOPY WITH PROPOFOL N/A 05/09/2016   Procedure: COLONOSCOPY WITH PROPOFOL;  Surgeon: Manya Silvas, MD;  Location: [facility] ENDOSCOPY;  Service: Endoscopy;  Laterality: N/A;  . ESOPHAGOGASTRODUODENOSCOPY N/A 05/09/2016   Procedure: ESOPHAGOGASTRODUODENOSCOPY (EGD);  Surgeon: Manya Silvas, MD;  Location: [facility] ENDOSCOPY;  Service: Endoscopy;  Laterality: N/A;  . FRACTURE SURGERY     ORIF SHOULDER  . TONSILLECTOMY     Family History:  Family History  Problem Relation Age of Onset  . Hypertension Mother   . Stroke Father   . Heart Problems Father    Family Psychiatric  History: see H&P Social History   Substance and Sexual Activity  Alcohol Use No  . Alcohol/week: 0.0 standard drinks     Social History   Substance and Sexual Activity  Drug Use No    Social History   Socioeconomic History  . Marital status: Single    Spouse name: Not on file  . Number of children: Not on file  . Years of education: Not on file  . Highest education level: Not on file  Occupational History  . Not on file  Tobacco Use  . Smoking status: Never Smoker  . Smokeless tobacco: Never Used  Vaping Use  . Vaping Use: Never used  Substance and Sexual Activity  . Alcohol use: No    Alcohol/week: 0.0 standard drinks  . Drug use: No  . Sexual activity: Never  Other Topics Concern  . Not on file  Social History Narrative   The patient never finished high school but did get his GED. 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 He is  currently in disability and [name] is his legal guardian. He has been living in group homes for many years.      No pending legal charges   Social Determinants of Health   Financial Resource Strain:   . Difficulty of Paying Living Expenses: Not on file  Food Insecurity:   . Worried About Charity fundraiser in the Last Year: Not on file  . Ran Out of Food in the Last Year: Not on file  Transportation Needs:   . Lack of Transportation (Medical): Not on file  . Lack of Transportation (Non-Medical): Not on file  Physical Activity:   . Days of Exercise per Week: Not on file  . Minutes of Exercise per Session: Not on file  Stress:   . Feeling of Stress : Not on file  Social Connections:   . Frequency of Communication with Friends and Family: Not on file  . Frequency of Social Gatherings with Friends and Family: Not on file  . Attends Religious Services: Not on file  . Active Member of Clubs or Organizations: Not on file  . Attends Archivist Meetings: Not on file  . Marital Status: Not on file   Additional Social History:                         Sleep: Fair  Appetite:  Fair  Current Medications: Current Facility-Administered Medications  Medication Dose Route Frequency Provider Last Rate Last Admin  . acetaminophen (TYLENOL) tablet 650 mg  650 mg Oral Q6H PRN [name], MD   650 mg at 05/14/20 2122  . alum & mag hydroxide-simeth (MAALOX/MYLANTA) 200-200-20 MG/5ML suspension 30 mL  30 mL Oral Q4H PRN [name], MD      . aspirin EC tablet 81 mg  81 mg Oral Daily [name], [name], MD   81 mg at 05/29/20 0824  . clonazePAM (KLONOPIN) tablet 1 mg  1 mg Oral QID [name], [name], MD   1 mg at 05/29/20 1234  . divalproex (DEPAKOTE ER) 24 hr tablet 1,500 mg  1,500 mg Oral QHS [name], [name], MD   1,500 mg at 05/28/20 2122  . gabapentin (NEURONTIN) capsule 100 mg  100 mg Oral TID [name], [name], MD   100 mg at 05/29/20 1234  . hydrOXYzine  (ATARAX/VISTARIL) tablet 10 mg  10 mg Oral TID PRN [name], MD   10 mg at 05/27/20 0915  . influenza vac split quadrivalent PF (FLUARIX) injection 0.5 mL  0.5 mL Intramuscular Tomorrow-1000 [name], [name], MD      . lamoTRIgine (LAMICTAL) tablet 150 mg  150 mg Oral QHS Salley Scarlet, MD   150 mg at 05/28/20 2121  . linagliptin (TRADJENTA) tablet 5 mg  5 mg Oral Daily [name], [name], MD   5 mg at 05/29/20  1610  . loperamide (IMODIUM) capsule 2 mg  2 mg Oral Q4H PRN [name], MD   2 mg at 04/10/20 0917  . loratadine (CLARITIN) tablet 10 mg  10 mg Oral Daily [name], [name], MD   10 mg at 05/29/20 0824  . LORazepam (ATIVAN) tablet 2 mg  2 mg Oral Q6H PRN Salley Scarlet, MD   2 mg at 05/21/20 1614  . magnesium hydroxide (MILK OF MAGNESIA) suspension 30 mL  30 mL Oral Daily PRN [name], MD   30 mL at 03/23/20 1344  . metFORMIN (GLUCOPHAGE) tablet 850 mg  850 mg Oral BID WC [name], [name], MD   850 mg at 05/29/20 0824  . metoprolol succinate (TOPROL-XL) 24 hr tablet 50 mg  50 mg Oral Daily [name], [name], MD   50 mg at 05/29/20 0827  . ondansetron (ZOFRAN) tablet 4 mg  4 mg Oral Q8H PRN [name], [name], MD   4 mg at 04/30/20 2035  . oxybutynin (DITROPAN) tablet 10 mg  10 mg Oral BID [name], [name], MD   10 mg at 05/29/20 0824  . pantoprazole (PROTONIX) EC tablet 40 mg  40 mg Oral Daily [name], [name], MD   40 mg at 05/29/20 0824  . prazosin (MINIPRESS) capsule 1 mg  1 mg Oral QHS [name], [name], MD   1 mg at 05/28/20 2122  . QUEtiapine (SEROQUEL) tablet 100 mg  100 mg Oral QID [name], [name], MD   100 mg at 05/29/20 1234  . QUEtiapine (SEROQUEL) tablet 400 mg  400 mg Oral QHS [name], NP   400 mg at 05/28/20 2122  . sertraline (ZOLOFT) tablet 50 mg  50 mg Oral BID [name], [name], MD   50 mg at 05/29/20 0824  . simethicone (MYLICON) chewable tablet 80 mg  80 mg Oral TID [name], [name], MD   80 mg at 05/29/20 1234  . simvastatin (ZOCOR) tablet 20 mg  20 mg  Oral q1800 [name], [name], MD   20 mg at 05/28/20 1736  . tamsulosin (FLOMAX) capsule 0.8 mg  0.8 mg Oral QPC supper [name], [name], MD   0.8 mg at 05/28/20 1736    Lab Results:  No results found for this or any previous visit (from the past 48 hour(s)).  Blood Alcohol level:  Lab Results  Component Value Date   ETH <10 01/22/2020   ETH <10 96/10/5407    Metabolic Disorder Labs: Lab Results  Component Value Date   HGBA1C 6.6 (H) 02/20/2020   MPG 142.72 02/20/2020   No results found for: PROLACTIN Lab Results  Component Value Date   CHOL 153 04/10/2015   TRIG 166 (H) 04/10/2015   HDL 50 04/10/2015   CHOLHDL 3.1 04/10/2015   VLDL 33 04/10/2015   LDLCALC 70 04/10/2015    Physical Findings: AIMS: Facial and Oral Movements Muscles of Facial Expression: None, normal Lips and Perioral Area: None, normal Jaw: None, normal Tongue: None, normal,Extremity Movements Upper (arms, wrists, hands, fingers): None, normal Lower (legs, knees, ankles, toes): None, normal, Trunk Movements Neck, shoulders, hips: None, normal, Overall Severity Severity of abnormal movements (highest score from questions above): None, normal Incapacitation due to abnormal movements: None, normal Patient's awareness of abnormal movements (rate only patient's report): No Awareness, Dental Status Current problems with teeth and/or dentures?: No Does patient usually wear dentures?: No  CIWA:    COWS:     Musculoskeletal: Strength & Muscle Tone:  within normal limits Gait & Station: normal Patient leans: N/A  Psychiatric Specialty Exam: Physical Exam Vitals and nursing note reviewed.  Constitutional:      Appearance: Normal appearance.  HENT:     Head: Normocephalic and atraumatic.     Right Ear: External ear normal.     Left Ear: External ear normal.     Nose: Nose normal.     Mouth/Throat:     Mouth: Mucous membranes are moist.     Pharynx: Oropharynx is clear.  Eyes:     Extraocular  Movements: Extraocular movements intact.     Conjunctiva/sclera: Conjunctivae normal.     Pupils: Pupils are equal, round, and reactive to light.  Cardiovascular:     Rate and Rhythm: Normal rate.     Pulses: Normal pulses.  Pulmonary:     Effort: Pulmonary effort is normal.     Breath sounds: Normal breath sounds.  Abdominal:     General: Abdomen is flat.     Palpations: Abdomen is soft.  Musculoskeletal:        General: No swelling. Normal range of motion.     Cervical back: Normal range of motion and neck supple.  Skin:    General: Skin is warm and dry.  Neurological:     General: No focal deficit present.     Mental Status: He is alert and oriented to person, place, and time.  Psychiatric:        Attention and Perception: Attention and perception normal.        Mood and Affect: Mood and affect normal.        Speech: Speech normal.        Behavior: Behavior is cooperative.        Thought Content: Thought content is not paranoid. Thought content does not include homicidal or suicidal ideation.        Cognition and Memory: Cognition and memory normal.        Judgment: Judgment is impulsive.     Review of Systems  Constitutional: Negative for activity change and fatigue.  HENT: Negative for rhinorrhea and sore throat.   Eyes: Negative for photophobia and visual disturbance.  Respiratory: Negative for cough and shortness of breath.   Cardiovascular: Negative for chest pain and palpitations.  Gastrointestinal: Negative for constipation, diarrhea, nausea and vomiting.  Endocrine: Negative for cold intolerance and heat intolerance.  Genitourinary: Negative for difficulty urinating and dysuria.  Musculoskeletal: Negative for arthralgias and myalgias.  Skin: Negative for rash and wound.  Allergic/Immunologic: Negative for food allergies and immunocompromised state.  Neurological: Negative for dizziness and headaches.  Hematological: Negative for adenopathy. Does not bruise/bleed  easily.  Psychiatric/Behavioral: Negative for hallucinations and suicidal ideas. The patient is not nervous/anxious.     Blood pressure 130/69, pulse 70, temperature 97.8 F (36.6 C), temperature source Oral, resp. rate 18, height 5\' 8"  (1.727 m), weight 104.3 kg, SpO2 100 %.Body mass index is 34.96 kg/m.  General Appearance: Disheveled  Eye Contact:  Fair  Speech:  Normal rate  Volume:  Normal  Mood:  Euthymic  Affect:  Constricted  Thought Process:  Coherent  Orientation:  Full (Time, Place, and Person)  Thought Content:  Illogical  Suicidal Thoughts:  No  Homicidal Thoughts:  No  Memory:  NA  Judgement:  Impaired  Insight:  Shallow  Psychomotor Activity: wnl  Concentration:  Concentration: Poor and Attention Span: Poor  Recall:  [employer] of Knowledge:  Fair  Language:  Fair  Akathisia:  No  Handed:  Right  AIMS (if indicated):     Assets:  Communication Skills Desire for Improvement  ADL's:  Intact  Cognition:  Impaired,  Mild  Sleep:  Number of Hours: 5.75     Treatment Plan Summary: Daily contact with patient to assess and evaluate symptoms and progress in treatment and Medication management   Patient is a 64 year old male with the above-stated past psychiatric history who is seen in follow-up.  Chart reviewed. Patient discussed with nursing. Patient appears stable, likely at his mental baseline. He is awaiting placement. Referrals sent by SW.    Plan:  -continue inpatient psych admission; 15-minute checks; daily contact with patient to assess and evaluate symptoms and progress in treatment; psychoeducation.  -continue scheduled medications without changes today. Marland Kitchen aspirin EC  81 mg Oral Daily  . clonazePAM  1 mg Oral QID  . divalproex  1,500 mg Oral QHS  . gabapentin  100 mg Oral TID  . influenza vac split quadrivalent PF  0.5 mL Intramuscular Tomorrow-1000  . lamoTRIgine  150 mg Oral QHS  . linagliptin  5 mg Oral Daily  . loratadine  10 mg Oral Daily   . metFORMIN  850 mg Oral BID WC  . metoprolol succinate  50 mg Oral Daily  . oxybutynin  10 mg Oral BID  . pantoprazole  40 mg Oral Daily  . prazosin  1 mg Oral QHS  . QUEtiapine  100 mg Oral QID  . QUEtiapine  400 mg Oral QHS  . sertraline  50 mg Oral BID  . simethicone  80 mg Oral TID  . simvastatin  20 mg Oral q1800  . tamsulosin  0.8 mg Oral QPC supper   -continue PRN medications. acetaminophen, alum & mag hydroxide-simeth, hydrOXYzine, loperamide, LORazepam, magnesium hydroxide, ondansetron  -Disposition: to be determined.  Continuing to search for placement.  Salley Scarlet, MD 05/29/2020, 2:43 PM

## 2020-05-29 NOTE — Progress Notes (Signed)
BRIEF PHARMACY NOTE   This patient attended and participated in Medication Management Group counseling (05/28/20) led by [facility] staff pharmacist.  This interactive class reviews basic information about prescription medications and education on personal responsibility in medication management.  The class also includes general knowledge of 3 main classes of behavioral medications, including antipsychotics, antidepressants, and mood stabilizers.     Patient behavior was appropriate for group setting.   Educational materials sourced from:  "Medication Do's and Don'ts" from [email or web address]   "Mental Health Medications" from [email or web address] ,PharmD 05/29/2020 , 7:08 AM

## 2020-05-29 NOTE — Progress Notes (Signed)
Pt denies SI, HI, AVH, and pain. Cooperative with treatment, awaiting placement.

## 2020-05-29 NOTE — BHH Counselor (Addendum)
CSW received a follow up phone call from patients brother Alexander Beard [phone], who provided the following information for Mr. Alexander Beard, group home owner for the previous home that pt was in, [phone].  He also requested for Mr. Humphries to know that in case of emergencies that the pt should be taken to [facility] in case of emergencies.  CSW advised that she will inform Mr. Humphries, however, whatever facility is closest will likely be the default location.  CSW notes that pt's brother was provided the contact information for Mr. Humphries in the conference call mentioned in previous note.   CSW informed brother that decision would need to be made soon so that BMU team can proceed with Covid test and PPD necessary for placement.  Brother reported "I want to note that the other home housed [pt] for 20 years, they know him.  I want him to be placed at the best place for him."  CSW supported this stating that the BMU team did as well, however, CSW has exhausted resources for placement and this is the only option and per conversation with the group home manager that the brother would like for the patient to go the bed there is not guaranteed and they recommend pt to be placed with Mr. Humphries.  Pt's brother remains noncommittal.   Assunta Curtis, MSW, LCSW 05/29/2020 12:01 PM

## 2020-05-29 NOTE — BHH Group Notes (Signed)
LCSW Group Therapy Note  05/29/2020 2:37 PM  Type of Therapy and Topic:  Group Therapy:  Feelings around Relapse and Recovery  Participation Level:  Active   Description of Group:    Patients in this group will discuss emotions they experience before and after a relapse. They will process how experiencing these feelings, or avoidance of experiencing them, relates to having a relapse. Facilitator will guide patients to explore emotions they have related to recovery. Patients will be encouraged to process which emotions are more powerful. They will be guided to discuss the emotional reaction significant others in their lives may have to their relapse or recovery. Patients will be assisted in exploring ways to respond to the emotions of others without this contributing to a relapse.  Therapeutic Goals: 1. Patient will identify two or more emotions that lead to a relapse for them 2. Patient will identify two emotions that result when they relapse 3. Patient will identify two emotions related to recovery 4. Patient will demonstrate ability to communicate their needs through discussion and/or role plays   Summary of Patient Progress: Patient was an active listener and participated in group. Patient's contribution to group discussion were, at times, tangential to the topic. He reports utilizing coping strategies to prevent when feeling overwhelmed. Patient reported feeling "rebellious" at times prior to consuming foods that would negatively affect his blood pressure.     Therapeutic Modalities:   Cognitive Behavioral Therapy Solution-Focused Therapy Assertiveness Training Relapse Prevention Therapy   Paulla Dolly, MSW, [place], [place] 05/29/2020 2:37 PM

## 2020-05-29 NOTE — BHH Counselor (Signed)
Patient approached CSW to report that he would like to go to the group home.  He reports excitement for the home, however, he did report that he wanted to speak with brother about the idea.  CSW contacted Mr. Humphries about this and left a HIPAA compliant voicemail.    Assunta Curtis, MSW, LCSW 05/29/2020 4:05 PM

## 2020-05-29 NOTE — BHH Counselor (Signed)
CSW conducted a conference call with the patient's brother, [name], Dr. Domingo Cocking and Seward Carol, the potential group home owner [phone].  Mr. Reece Agar was able to hear [name] but not vice versa so CSW repeated the questions so that everyone was on the same page.    The following were covered in discussion: -address of the group home -structure of the group home (# of beds, coed, location, [employer] program) -address [place], [place], [place] transportation (there is a need for the family to provide transportation for extra activities) -[name] was curious if the home had anyone with Hepatitis and MRSA, Mr. Humphries reports that they do not -Availability of crisis services pt is used to like [facility]-CSW updated that they have a local provider [facility] that comes to the home daily per the interview pt had with Mr. Vanice Sarah appeared to go well.  Pts brother had appropriate questions for Mr. Humphries.  CSW will follow up early next week on if moving forward with this placement.   Assunta Curtis, MSW, LCSW 05/29/2020 11:54 AM

## 2020-05-30 NOTE — BHH Group Notes (Signed)
   LCSW Group Therapy Note    05/30/2020: 1:30 PM- 2:15 PM     Type of Therapy and Topic:  Group Therapy:  Overcoming Obstacles     Participation Level:  None     Description of Group:     In this group patients will be encouraged to explore what they see as obstacles to their own wellness and recovery. They will be guided to discuss their thoughts, feelings, and behaviors related to these obstacles. The group will process together ways to cope with barriers, with attention given to specific choices patients can make. Each patient will be challenged to identify changes they are motivated to make in order to overcome their obstacles. This group will be process-oriented, with patients participating in exploration of their own experiences as well as giving and receiving support and challenge from other group members.     Therapeutic Goals:  1.    Patient will identify personal and current obstacles as they relate to admission.  2.    Patient will identify barriers that currently interfere with their wellness or overcoming obstacles.  3.    Patient will identify feelings, thought process and behaviors related to these barriers.  4.    Patient will identify two changes they are willing to make to overcome these obstacles:        Summary of Patient Progress: Patient came to group but did not share.         Therapeutic Modalities:    Cognitive Behavioral Therapy  Solution Focused Therapy  Motivational Interviewing  Relapse Prevention Therapy    [name], [place]   05/30/2020

## 2020-05-30 NOTE — Progress Notes (Signed)
D: Pt alert and oriented. Pt rates depression 0/10, hopelessness 0/10, and anxiety 0/10. Pt goal: "Craft work, rest, think positive." Pt reports energy level as normal and concentration as being good. Pt reports sleep last night as being good. Pt did not receive medications for sleep. Pt denies experiencing any pain at this time. Pt denies experiencing any SI/HI, or AVH at this time.   Pt observed outside of the medication room being silly dancing and in good spirits.   After dinner today pt had an emotional breakdown. Pt has developed feels toward another pt and has become jealous that she is speaking to another male pt and not reciprocating feeling toward him [name] Done). Pt voices that he's had these feels for the other pt for several days now is upset that when he sits next to her with the other male pt on the other side that she does not speak to him [name] Done) just to the other pt. This pt believes that this male pt is going to leave of meet up with the other male pt once discharged. This Probation officer along with MHT's escorted Zair to the medication room from the dayroom where this conversation took place as well as where prn meds where given. RN and MHT's were able to have a sensible conversation with the pt about the situation and get this pt to use his coping skills to calm down some. Pt has since deescalated and is in his room.  A: Scheduled medications administered to pt, per MD orders. Support and encouragement provided. Frequent verbal contact made. Routine safety checks conducted q15 minutes.   R: No adverse drug reactions noted. Pt verbally contracts for safety at this time. Pt complaint with medications. Pt interacts well with others on the unit. Pt remains safe at this time. Will continue to monitor.

## 2020-05-30 NOTE — Plan of Care (Signed)
  Problem: Education: Goal: Emotional status will improve Outcome: Progressing Goal: Mental status will improve Outcome: Progressing Goal: Verbalization of understanding the information provided will improve Outcome: Progressing   Problem: Activity: Goal: Sleeping patterns will improve Outcome: Progressing   Problem: Coping: Goal: Ability to verbalize frustrations and anger appropriately will improve Outcome: Progressing Goal: Ability to demonstrate self-control will improve Outcome: Progressing   Problem: Safety: Goal: Periods of time without injury will increase Outcome: Progressing

## 2020-05-30 NOTE — Progress Notes (Signed)
[facility] MD Progress Note  05/30/2020 9:49 AM Alexander Beard  MRN:  [id number]  Principal Problem: Schizoaffective disorder, bipolar type ([place]) Diagnosis: Principal Problem:   Schizoaffective disorder, bipolar type ([place]) Active Problems:   Active autistic disorder   Diabetes ([place])   Hypertension   Prostate hypertrophy   Intellectual disability   At risk for elopement  [name] is a 64 y.o. male pt that has a previous psychiatric history of Schizoaffective disorder who presents to the [facility] unit for treatment of psychosis.  Interval History Patient was seen today for re-evaluation.  Nursing reports no events overnight. The patient reports no issues with performing ADLs.  Patient has been medication compliant.  The patient reports no side effects from medications.    SUBJECTIVE:  Patient seen one-on-one this morning.  He reports "good" mood; denies feeling depressed. He denies suicidal or homicidal thoughts. He denies any hallucinations at the time of the interview. He denies side effects from his current medications. He denies any physical complaints. He had an interview with group home that went very well. He is looking forward to arranging a time to discharge to the facility. Awaiting confirmation on discharge placement date from group home.   Total Time spent with patient: 30 minutes  Past Psychiatric History: see H&P   Past Medical History:  Past Medical History:  Diagnosis Date  . Anxiety   . Anxiety disorder due to known physiological condition    HOSPITALIZED 10/18  . Arthritis   . Autistic disorder, residual state   . COPD (chronic obstructive pulmonary disease) ([place])   . Depression   . Developmental disorder   . Diabetes mellitus without complication ([place])   . Dyslipidemia   . Esophageal reflux   . HOH (hard of hearing)    MILDLY  . Hypertension   . Obesity   . Overactive bladder   . Palpitations    ANXIETY  . Schizophrenia, schizoaffective ([place])   . Sleep apnea    . Tremors of nervous system    HANDS DUE TO MEDICATIONS  . Urinary incontinence     Past Surgical History:  Procedure Laterality Date  . CATARACT EXTRACTION W/PHACO Left 11/14/2017   Procedure: CATARACT EXTRACTION PHACO AND INTRAOCULAR LENS PLACEMENT (IOC);  Surgeon: Birder Robson, MD;  Location: [facility] ORS;  Service: Ophthalmology;  Laterality: Left;  [place] 00:42 AP% 14.6 CDE 6.12 Fluid pack lot # [id number]  . COLONOSCOPY  01/28/2005  . COLONOSCOPY WITH PROPOFOL N/A 05/09/2016   Procedure: COLONOSCOPY WITH PROPOFOL;  Surgeon: Manya Silvas, MD;  Location: [facility] ENDOSCOPY;  Service: Endoscopy;  Laterality: N/A;  . ESOPHAGOGASTRODUODENOSCOPY N/A 05/09/2016   Procedure: ESOPHAGOGASTRODUODENOSCOPY (EGD);  Surgeon: Manya Silvas, MD;  Location: [facility] ENDOSCOPY;  Service: Endoscopy;  Laterality: N/A;  . FRACTURE SURGERY     ORIF SHOULDER  . TONSILLECTOMY     Family History:  Family History  Problem Relation Age of Onset  . Hypertension Mother   . Stroke Father   . Heart Problems Father    Family Psychiatric  History: see H&P Social History   Substance and Sexual Activity  Alcohol Use No  . Alcohol/week: 0.0 standard drinks     Social History   Substance and Sexual Activity  Drug Use No    Social History   Socioeconomic History  . Marital status: Single    Spouse name: Not on file  . Number of children: Not on file  . Years of education: Not on file  . Highest education  level: Not on file  Occupational History  . Not on file  Tobacco Use  . Smoking status: Never Smoker  . Smokeless tobacco: Never Used  Vaping Use  . Vaping Use: Never used  Substance and Sexual Activity  . Alcohol use: No    Alcohol/week: 0.0 standard drinks  . Drug use: No  . Sexual activity: Never  Other Topics Concern  . Not on file  Social History Narrative   The patient never finished high school but did get his GED. He works in the past as a Museum/gallery conservator. He has never been  married and has no children. He is currently in disability and [name] is his legal guardian. He has been living in group homes for many years.      No pending legal charges   Social Determinants of Health   Financial Resource Strain:   . Difficulty of Paying Living Expenses: Not on file  Food Insecurity:   . Worried About Charity fundraiser in the Last Year: Not on file  . Ran Out of Food in the Last Year: Not on file  Transportation Needs:   . Lack of Transportation (Medical): Not on file  . Lack of Transportation (Non-Medical): Not on file  Physical Activity:   . Days of Exercise per Week: Not on file  . Minutes of Exercise per Session: Not on file  Stress:   . Feeling of Stress : Not on file  Social Connections:   . Frequency of Communication with Friends and Family: Not on file  . Frequency of Social Gatherings with Friends and Family: Not on file  . Attends Religious Services: Not on file  . Active Member of Clubs or Organizations: Not on file  . Attends Archivist Meetings: Not on file  . Marital Status: Not on file   Additional Social History:                         Sleep: Fair  Appetite:  Fair  Current Medications: Current Facility-Administered Medications  Medication Dose Route Frequency Provider Last Rate Last Admin  . acetaminophen (TYLENOL) tablet 650 mg  650 mg Oral Q6H PRN [name], MD   650 mg at 05/14/20 2122  . alum & mag hydroxide-simeth (MAALOX/MYLANTA) 200-200-20 MG/5ML suspension 30 mL  30 mL Oral Q4H PRN [name], MD      . aspirin EC tablet 81 mg  81 mg Oral Daily [name], [name], MD   81 mg at 05/30/20 7867  . clonazePAM (KLONOPIN) tablet 1 mg  1 mg Oral QID [name], [name], MD   1 mg at 05/30/20 6720  . divalproex (DEPAKOTE ER) 24 hr tablet 1,500 mg  1,500 mg Oral QHS [name], [name], MD   1,500 mg at 05/29/20 2030  . gabapentin (NEURONTIN) capsule 100 mg  100 mg Oral TID [name], [name], MD   100 mg  at 05/30/20 9470  . hydrOXYzine (ATARAX/VISTARIL) tablet 10 mg  10 mg Oral TID PRN [name], MD   10 mg at 05/27/20 0915  . influenza vac split quadrivalent PF (FLUARIX) injection 0.5 mL  0.5 mL Intramuscular Tomorrow-1000 [name], [name], MD      . lamoTRIgine (LAMICTAL) tablet 150 mg  150 mg Oral QHS Salley Scarlet, MD   150 mg at 05/29/20 2030  . linagliptin (TRADJENTA) tablet 5 mg  5 mg Oral Daily [name], [name], MD  5 mg at 05/30/20 0820  . loperamide (IMODIUM) capsule 2 mg  2 mg Oral Q4H PRN [name], MD   2 mg at 04/10/20 0917  . loratadine (CLARITIN) tablet 10 mg  10 mg Oral Daily [name], [name], MD   10 mg at 05/30/20 0820  . LORazepam (ATIVAN) tablet 2 mg  2 mg Oral Q6H PRN Salley Scarlet, MD   2 mg at 05/21/20 1614  . magnesium hydroxide (MILK OF MAGNESIA) suspension 30 mL  30 mL Oral Daily PRN [name], MD   30 mL at 03/23/20 1344  . metFORMIN (GLUCOPHAGE) tablet 850 mg  850 mg Oral BID WC [name], [name], MD   850 mg at 05/30/20 1937  . metoprolol succinate (TOPROL-XL) 24 hr tablet 50 mg  50 mg Oral Daily [name], [name], MD   50 mg at 05/30/20 9024  . ondansetron (ZOFRAN) tablet 4 mg  4 mg Oral Q8H PRN [name], [name], MD   4 mg at 04/30/20 2035  . oxybutynin (DITROPAN) tablet 10 mg  10 mg Oral BID [name], [name], MD   10 mg at 05/30/20 0973  . pantoprazole (PROTONIX) EC tablet 40 mg  40 mg Oral Daily [name], [name], MD   40 mg at 05/30/20 0820  . prazosin (MINIPRESS) capsule 1 mg  1 mg Oral QHS [name], [name], MD   1 mg at 05/29/20 2029  . QUEtiapine (SEROQUEL) tablet 100 mg  100 mg Oral QID [name], [name], MD   100 mg at 05/30/20 5329  . QUEtiapine (SEROQUEL) tablet 400 mg  400 mg Oral QHS [name], NP   400 mg at 05/29/20 2030  . sertraline (ZOLOFT) tablet 50 mg  50 mg Oral BID [name], [name], MD   50 mg at 05/30/20 9242  . simethicone (MYLICON) chewable tablet 80 mg  80 mg Oral TID [name], [name], MD   80 mg at 05/30/20 6834  .  simvastatin (ZOCOR) tablet 20 mg  20 mg Oral q1800 [name], [name], MD   20 mg at 05/29/20 1727  . tamsulosin (FLOMAX) capsule 0.8 mg  0.8 mg Oral QPC supper [name], [name], MD   0.8 mg at 05/29/20 1728    Lab Results:  No results found for this or any previous visit (from the past 48 hour(s)).  Blood Alcohol level:  Lab Results  Component Value Date   ETH <10 01/22/2020   ETH <10 19/62/2297    Metabolic Disorder Labs: Lab Results  Component Value Date   HGBA1C 6.6 (H) 02/20/2020   MPG 142.72 02/20/2020   No results found for: PROLACTIN Lab Results  Component Value Date   CHOL 153 04/10/2015   TRIG 166 (H) 04/10/2015   HDL 50 04/10/2015   CHOLHDL 3.1 04/10/2015   VLDL 33 04/10/2015   LDLCALC 70 04/10/2015    Physical Findings: AIMS: Facial and Oral Movements Muscles of Facial Expression: None, normal Lips and Perioral Area: None, normal Jaw: None, normal Tongue: None, normal,Extremity Movements Upper (arms, wrists, hands, fingers): None, normal Lower (legs, knees, ankles, toes): None, normal, Trunk Movements Neck, shoulders, hips: None, normal, Overall Severity Severity of abnormal movements (highest score from questions above): None, normal Incapacitation due to abnormal movements: None, normal Patient's awareness of abnormal movements (rate only patient's report): No Awareness, Dental Status Current problems with teeth and/or dentures?: No Does patient usually wear dentures?: No  CIWA:    COWS:     Musculoskeletal:  Strength & Muscle Tone: within normal limits Gait & Station: normal Patient leans: N/A  Psychiatric Specialty Exam: Physical Exam Vitals and nursing note reviewed.  Constitutional:      Appearance: Normal appearance.  HENT:     Head: Normocephalic and atraumatic.     Right Ear: External ear normal.     Left Ear: External ear normal.     Nose: Nose normal.     Mouth/Throat:     Mouth: Mucous membranes are moist.     Pharynx: Oropharynx is  clear.  Eyes:     Extraocular Movements: Extraocular movements intact.     Conjunctiva/sclera: Conjunctivae normal.     Pupils: Pupils are equal, round, and reactive to light.  Cardiovascular:     Rate and Rhythm: Normal rate.     Pulses: Normal pulses.  Pulmonary:     Effort: Pulmonary effort is normal.     Breath sounds: Normal breath sounds.  Abdominal:     General: Abdomen is flat.     Palpations: Abdomen is soft.  Musculoskeletal:        General: No swelling. Normal range of motion.     Cervical back: Normal range of motion and neck supple.  Skin:    General: Skin is warm and dry.  Neurological:     General: No focal deficit present.     Mental Status: He is alert and oriented to person, place, and time.  Psychiatric:        Attention and Perception: Attention and perception normal.        Mood and Affect: Mood and affect normal.        Speech: Speech normal.        Behavior: Behavior is cooperative.        Thought Content: Thought content is not paranoid. Thought content does not include homicidal or suicidal ideation.        Cognition and Memory: Cognition and memory normal.        Judgment: Judgment is impulsive.     Review of Systems  Constitutional: Negative for activity change and fatigue.  HENT: Negative for rhinorrhea and sore throat.   Eyes: Negative for photophobia and visual disturbance.  Respiratory: Negative for cough and shortness of breath.   Cardiovascular: Negative for chest pain and palpitations.  Gastrointestinal: Negative for constipation, diarrhea, nausea and vomiting.  Endocrine: Negative for cold intolerance and heat intolerance.  Genitourinary: Negative for difficulty urinating and dysuria.  Musculoskeletal: Negative for arthralgias and myalgias.  Skin: Negative for rash and wound.  Allergic/Immunologic: Negative for food allergies and immunocompromised state.  Neurological: Negative for dizziness and headaches.  Hematological: Negative for  adenopathy. Does not bruise/bleed easily.  Psychiatric/Behavioral: Negative for hallucinations and suicidal ideas. The patient is not nervous/anxious.     Blood pressure 138/82, pulse 62, temperature 97.9 F (36.6 C), temperature source Oral, resp. rate 18, height 5\' 8"  (1.727 m), weight 104.3 kg, SpO2 98 %.Body mass index is 34.96 kg/m.  General Appearance: Fairly groomed  Eye Contact:  Fair  Speech:  Normal rate  Volume:  Normal  Mood:  Euthymic  Affect:  Constricted  Thought Process:  Coherent  Orientation:  Full (Time, Place, and Person)  Thought Content:  Logical  Suicidal Thoughts:  No  Homicidal Thoughts:  No  Memory:  NA  Judgement:  Impaired  Insight:  Shallow  Psychomotor Activity: wnl  Concentration:  Concentration: Poor and Attention Span: Poor  Recall:  [employer] of Knowledge:  Fair  Language:  Fair  Akathisia:  No  Handed:  Right  AIMS (if indicated):     Assets:  Communication Skills Desire for Improvement  ADL's:  Intact  Cognition:  Impaired,  Mild  Sleep:  Number of Hours: 8     Treatment Plan Summary: Daily contact with patient to assess and evaluate symptoms and progress in treatment and Medication management   Patient is a 64 year old male with the above-stated past psychiatric history who is seen in follow-up.  Chart reviewed. Patient discussed with nursing. Patient appears stable, likely at his mental baseline. He is awaiting placement. Referrals sent by SW.    Plan:  -continue inpatient psych admission; 15-minute checks; daily contact with patient to assess and evaluate symptoms and progress in treatment; psychoeducation.  -continue scheduled medications without changes today. Marland Kitchen aspirin EC  81 mg Oral Daily  . clonazePAM  1 mg Oral QID  . divalproex  1,500 mg Oral QHS  . gabapentin  100 mg Oral TID  . influenza vac split quadrivalent PF  0.5 mL Intramuscular Tomorrow-1000  . lamoTRIgine  150 mg Oral QHS  . linagliptin  5 mg Oral Daily  .  loratadine  10 mg Oral Daily  . metFORMIN  850 mg Oral BID WC  . metoprolol succinate  50 mg Oral Daily  . oxybutynin  10 mg Oral BID  . pantoprazole  40 mg Oral Daily  . prazosin  1 mg Oral QHS  . QUEtiapine  100 mg Oral QID  . QUEtiapine  400 mg Oral QHS  . sertraline  50 mg Oral BID  . simethicone  80 mg Oral TID  . simvastatin  20 mg Oral q1800  . tamsulosin  0.8 mg Oral QPC supper   -continue PRN medications. acetaminophen, alum & mag hydroxide-simeth, hydrOXYzine, loperamide, LORazepam, magnesium hydroxide, ondansetron  -Disposition: to be determined.  Continuing to search for placement.  Salley Scarlet, MD 05/30/2020, 9:49 AM

## 2020-05-30 NOTE — Progress Notes (Signed)
Patient had uneventful night. Denies any SI, HI, AVH. Medication compliant although made a big sigh when it was time to take meds. Slept well no complaints. Encouragement and support provided. Safety checks maintained. Medications given as prescribed. Pt receptive and remains safe on unit with q 15 min checks.

## 2020-05-31 NOTE — [facility] Group Notes (Addendum)
LCSW Group Therapy Note     05/31/2020:  1:00 PM- 1:54 PM                 Type of Therapy and Topic:  Group Therapy: Establishing Boundaries    Participation Level:  Active     Description of Group:    In this group, patients learned how to define boundaries, discussed the different types or boundaries with examples.  They identified times that boundaries had been violated and how they reacted.  They analyzed how their reaction was possibly beneficial and how it was possibly unhelpful.  The group discussed how to set boundaries, respect others boundaries and communicate their boundaries. The group utilized a role play scenarios (working with a partner) and discussed how each person in the scenario could have reacted differently and what boundaries they need to implement to improve their life. Patients also discussed consequences to overstepping boundaries and lack of boundaries. Patients discussed how to establish boundaries with clear consequences. Patients will explore discussion questions that address media influence and why it is hard to set boundaries.       Therapeutic Goals:   Patients will define boundaries and explore (physical, personal space and language boundaries).  Patients will remember their last incident where their boundaries were violated and how they behaved.  Patients will practice empathy and understanding of other's boundaries and learn from others in group.  Patients will explore how they may have crossed another person's boundaries in the past.   Patients will learn healthy ways to set and communicate boundaries.  Patients will actively engage in group activity utilizing role play and critical thinking skills.      Summary of Patient Progress:  Patient checked into group feeling okay. Patient was quieter then he usually is in group. Patients were presented with two scenarios about establishing boundaries. Patient stated that if the individuals in the scenarios  had balance then there wouldn't be any conflict.    Therapeutic Modalities:    Cognitive Behavioral Therapy      [name], Latanya Presser   05/31/2020

## 2020-05-31 NOTE — Progress Notes (Signed)
[facility] MD Progress Note  05/31/2020 11:41 AM Alexander Beard  MRN:  [id number]  Principal Problem: Schizoaffective disorder, bipolar type ([place]) Diagnosis: Principal Problem:   Schizoaffective disorder, bipolar type ([place]) Active Problems:   Active autistic disorder   Diabetes ([place])   Hypertension   Prostate hypertrophy   Intellectual disability   At risk for elopement  Alexander Beard is a 64 y.o. male pt that has a previous psychiatric history of Schizoaffective disorder who presents to the [facility] unit for treatment of psychosis.  Interval History Patient was seen today for re-evaluation.  Nursing reports no events overnight. The patient reports no issues with performing ADLs.  Patient has been medication compliant.  The patient reports no side effects from medications.    SUBJECTIVE:  Patient seen one-on-one this morning.  He reports feeling down because he feels like a peer on the unit likes another male patient better than him. He states he wants to be friends with the peer. Reassurance provided to patient and he is able to calm himself. He denies suicidal or homicidal thoughts. He denies any hallucinations at the time of the interview. He denies side effects from his current medications. He denies any physical complaints. He had an interview with group home that went very well. He is looking forward to arranging a time to discharge to the facility. Awaiting confirmation on discharge placement date from group home.   Total Time spent with patient: 30 minutes  Past Psychiatric History: see H&P   Past Medical History:  Past Medical History:  Diagnosis Date  . Anxiety   . Anxiety disorder due to known physiological condition    HOSPITALIZED 10/18  . Arthritis   . Autistic disorder, residual state   . COPD (chronic obstructive pulmonary disease) ([place])   . Depression   . Developmental disorder   . Diabetes mellitus without complication ([place])   . Dyslipidemia   . Esophageal reflux   . HOH  (hard of hearing)    MILDLY  . Hypertension   . Obesity   . Overactive bladder   . Palpitations    ANXIETY  . Schizophrenia, schizoaffective ([place])   . Sleep apnea   . Tremors of nervous system    HANDS DUE TO MEDICATIONS  . Urinary incontinence     Past Surgical History:  Procedure Laterality Date  . CATARACT EXTRACTION W/PHACO Left 11/14/2017   Procedure: CATARACT EXTRACTION PHACO AND INTRAOCULAR LENS PLACEMENT (IOC);  Surgeon: Birder Robson, MD;  Location: [facility] ORS;  Service: Ophthalmology;  Laterality: Left;  [place] 00:42 AP% 14.6 CDE 6.12 Fluid pack lot # [id number]  . COLONOSCOPY  01/28/2005  . COLONOSCOPY WITH PROPOFOL N/A 05/09/2016   Procedure: COLONOSCOPY WITH PROPOFOL;  Surgeon: Manya Silvas, MD;  Location: [facility] ENDOSCOPY;  Service: Endoscopy;  Laterality: N/A;  . ESOPHAGOGASTRODUODENOSCOPY N/A 05/09/2016   Procedure: ESOPHAGOGASTRODUODENOSCOPY (EGD);  Surgeon: Manya Silvas, MD;  Location: [facility] ENDOSCOPY;  Service: Endoscopy;  Laterality: N/A;  . FRACTURE SURGERY     ORIF SHOULDER  . TONSILLECTOMY     Family History:  Family History  Problem Relation Age of Onset  . Hypertension Mother   . Stroke Father   . Heart Problems Father    Family Psychiatric  History: see H&P Social History   Substance and Sexual Activity  Alcohol Use No  . Alcohol/week: 0.0 standard drinks     Social History   Substance and Sexual Activity  Drug Use No    Social History   Socioeconomic  History  . Marital status: Single    Spouse name: Not on file  . Number of children: Not on file  . Years of education: Not on file  . Highest education level: Not on file  Occupational History  . Not on file  Tobacco Use  . Smoking status: Never Smoker  . Smokeless tobacco: Never Used  Vaping Use  . Vaping Use: Never used  Substance and Sexual Activity  . Alcohol use: No    Alcohol/week: 0.0 standard drinks  . Drug use: No  . Sexual activity: Never  Other Topics Concern  .  Not on file  Social History Narrative   The patient never finished high school but did get his GED. 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 He is currently in disability and [name] is his legal guardian. He has been living in group homes for many years.      No pending legal charges   Social Determinants of Health   Financial Resource Strain:   . Difficulty of Paying Living Expenses: Not on file  Food Insecurity:   . Worried About Charity fundraiser in the Last Year: Not on file  . Ran Out of Food in the Last Year: Not on file  Transportation Needs:   . Lack of Transportation (Medical): Not on file  . Lack of Transportation (Non-Medical): Not on file  Physical Activity:   . Days of Exercise per Week: Not on file  . Minutes of Exercise per Session: Not on file  Stress:   . Feeling of Stress : Not on file  Social Connections:   . Frequency of Communication with Friends and Family: Not on file  . Frequency of Social Gatherings with Friends and Family: Not on file  . Attends Religious Services: Not on file  . Active Member of Clubs or Organizations: Not on file  . Attends Archivist Meetings: Not on file  . Marital Status: Not on file   Additional Social History:                         Sleep: Fair  Appetite:  Fair  Current Medications: Current Facility-Administered Medications  Medication Dose Route Frequency Provider Last Rate Last Admin  . acetaminophen (TYLENOL) tablet 650 mg  650 mg Oral Q6H PRN [name], MD   650 mg at 05/14/20 2122  . alum & mag hydroxide-simeth (MAALOX/MYLANTA) 200-200-20 MG/5ML suspension 30 mL  30 mL Oral Q4H PRN [name], MD      . aspirin EC tablet 81 mg  81 mg Oral Daily [name], [name], MD   81 mg at 05/31/20 0958  . clonazePAM (KLONOPIN) tablet 1 mg  1 mg Oral QID [name], [name], MD   1 mg at 05/31/20 0958  . divalproex (DEPAKOTE ER) 24  hr tablet 1,500 mg  1,500 mg Oral QHS [name], [name], MD   1,500 mg at 05/31/20 0025  . gabapentin (NEURONTIN) capsule 100 mg  100 mg Oral TID [name], [name], MD   100 mg at 05/31/20 0958  . hydrOXYzine (ATARAX/VISTARIL) tablet 10 mg  10 mg Oral TID PRN [name], MD   10 mg at 05/27/20 0915  . influenza vac split quadrivalent PF (FLUARIX) injection 0.5 mL  0.5 mL Intramuscular Tomorrow-1000 [name], [name], MD      . lamoTRIgine (LAMICTAL) tablet 150  mg  150 mg Oral QHS Salley Scarlet, MD   150 mg at 05/31/20 0024  . linagliptin (TRADJENTA) tablet 5 mg  5 mg Oral Daily [name], [name], MD   5 mg at 05/31/20 0958  . loperamide (IMODIUM) capsule 2 mg  2 mg Oral Q4H PRN [name], MD   2 mg at 04/10/20 0917  . loratadine (CLARITIN) tablet 10 mg  10 mg Oral Daily [name], [name], MD   10 mg at 05/31/20 0959  . LORazepam (ATIVAN) tablet 2 mg  2 mg Oral Q6H PRN Salley Scarlet, MD   2 mg at 05/30/20 1628  . magnesium hydroxide (MILK OF MAGNESIA) suspension 30 mL  30 mL Oral Daily PRN [name], MD   30 mL at 03/23/20 1344  . metFORMIN (GLUCOPHAGE) tablet 850 mg  850 mg Oral BID WC [name], [name], MD   850 mg at 05/31/20 0957  . metoprolol succinate (TOPROL-XL) 24 hr tablet 50 mg  50 mg Oral Daily [name], [name], MD   50 mg at 05/31/20 0959  . ondansetron (ZOFRAN) tablet 4 mg  4 mg Oral Q8H PRN [name], [name], MD   4 mg at 04/30/20 2035  . oxybutynin (DITROPAN) tablet 10 mg  10 mg Oral BID [name], [name], MD   10 mg at 05/31/20 0956  . pantoprazole (PROTONIX) EC tablet 40 mg  40 mg Oral Daily [name], [name], MD   40 mg at 05/31/20 0958  . prazosin (MINIPRESS) capsule 1 mg  1 mg Oral QHS [name], [name], MD   1 mg at 05/29/20 2029  . QUEtiapine (SEROQUEL) tablet 100 mg  100 mg Oral QID [name], [name], MD   100 mg at 05/31/20 0959  . QUEtiapine (SEROQUEL) tablet 400 mg  400 mg Oral QHS [name], NP   400 mg at 05/31/20 0025  . sertraline (ZOLOFT) tablet 50 mg  50 mg  Oral BID [name], [name], MD   50 mg at 05/31/20 0958  . simethicone (MYLICON) chewable tablet 80 mg  80 mg Oral TID [name], [name], MD   80 mg at 05/31/20 0957  . simvastatin (ZOCOR) tablet 20 mg  20 mg Oral q1800 [name], [name], MD   20 mg at 05/30/20 1732  . tamsulosin (FLOMAX) capsule 0.8 mg  0.8 mg Oral QPC supper [name], [name], MD   0.8 mg at 05/30/20 1732    Lab Results:  No results found for this or any previous visit (from the past 48 hour(s)).  Blood Alcohol level:  Lab Results  Component Value Date   ETH <10 01/22/2020   ETH <10 20/25/4270    Metabolic Disorder Labs: Lab Results  Component Value Date   HGBA1C 6.6 (H) 02/20/2020   MPG 142.72 02/20/2020   No results found for: PROLACTIN Lab Results  Component Value Date   CHOL 153 04/10/2015   TRIG 166 (H) 04/10/2015   HDL 50 04/10/2015   CHOLHDL 3.1 04/10/2015   VLDL 33 04/10/2015   LDLCALC 70 04/10/2015    Physical Findings: AIMS: Facial and Oral Movements Muscles of Facial Expression: None, normal Lips and Perioral Area: None, normal Jaw: None, normal Tongue: None, normal,Extremity Movements Upper (arms, wrists, hands, fingers): None, normal Lower (legs, knees, ankles, toes): None, normal, Trunk Movements Neck, shoulders, hips: None, normal, Overall Severity Severity of abnormal movements (highest score from questions above): None, normal Incapacitation due to abnormal movements: None, normal Patient's awareness  of abnormal movements (rate only patient's report): No Awareness, Dental Status Current problems with teeth and/or dentures?: No Does patient usually wear dentures?: No  CIWA:    COWS:     Musculoskeletal: Strength & Muscle Tone: within normal limits Gait & Station: normal Patient leans: N/A  Psychiatric Specialty Exam: Physical Exam Vitals and nursing note reviewed.  Constitutional:      Appearance: Normal appearance.  HENT:     Head: Normocephalic and atraumatic.     Right Ear:  External ear normal.     Left Ear: External ear normal.     Nose: Nose normal.     Mouth/Throat:     Mouth: Mucous membranes are moist.     Pharynx: Oropharynx is clear.  Eyes:     Extraocular Movements: Extraocular movements intact.     Conjunctiva/sclera: Conjunctivae normal.     Pupils: Pupils are equal, round, and reactive to light.  Cardiovascular:     Rate and Rhythm: Normal rate.     Pulses: Normal pulses.  Pulmonary:     Effort: Pulmonary effort is normal.     Breath sounds: Normal breath sounds.  Abdominal:     General: Abdomen is flat.     Palpations: Abdomen is soft.  Musculoskeletal:        General: No swelling. Normal range of motion.     Cervical back: Normal range of motion and neck supple.  Skin:    General: Skin is warm and dry.  Neurological:     General: No focal deficit present.     Mental Status: He is alert and oriented to person, place, and time.  Psychiatric:        Attention and Perception: Attention and perception normal.        Mood and Affect: Mood is anxious. Affect is tearful.        Speech: Speech normal.        Behavior: Behavior is cooperative.        Thought Content: Thought content is not paranoid. Thought content does not include homicidal or suicidal ideation.        Cognition and Memory: Cognition and memory normal.        Judgment: Judgment is impulsive.     Review of Systems  Constitutional: Negative for activity change and fatigue.  HENT: Negative for rhinorrhea and sore throat.   Eyes: Negative for photophobia and visual disturbance.  Respiratory: Negative for cough and shortness of breath.   Cardiovascular: Negative for chest pain and palpitations.  Gastrointestinal: Negative for constipation, diarrhea, nausea and vomiting.  Endocrine: Negative for cold intolerance and heat intolerance.  Genitourinary: Negative for difficulty urinating and dysuria.  Musculoskeletal: Negative for arthralgias and myalgias.  Skin: Negative for  rash and wound.  Allergic/Immunologic: Negative for food allergies and immunocompromised state.  Neurological: Negative for dizziness and headaches.  Hematological: Negative for adenopathy. Does not bruise/bleed easily.  Psychiatric/Behavioral: Negative for hallucinations and suicidal ideas. The patient is not nervous/anxious.     Blood pressure 118/75, pulse (!) 58, temperature 98.2 F (36.8 C), temperature source Oral, resp. rate 18, height 5\' 8"  (1.727 m), weight 104.3 kg, SpO2 100 %.Body mass index is 34.96 kg/m.  General Appearance: Fairly groomed  Eye Contact:  Fair  Speech:  Normal rate  Volume:  Normal  Mood:  Euthymic  Affect:  Constricted  Thought Process:  Coherent  Orientation:  Full (Time, Place, and Person)  Thought Content:  Logical  Suicidal Thoughts:  No  Homicidal Thoughts:  No  Memory:  NA  Judgement:  Impaired  Insight:  Shallow  Psychomotor Activity: wnl  Concentration:  Concentration: Poor and Attention Span: Poor  Recall:  [employer] of Knowledge:  Fair  Language:  Fair  Akathisia:  No  Handed:  Right  AIMS (if indicated):     Assets:  Communication Skills Desire for Improvement  ADL's:  Intact  Cognition:  Impaired,  Mild  Sleep:  Number of Hours: 8     Treatment Plan Summary: Daily contact with patient to assess and evaluate symptoms and progress in treatment and Medication management   Patient is a 64 year old male with the above-stated past psychiatric history who is seen in follow-up.  Chart reviewed. Patient discussed with nursing. Patient appears stable, likely at his mental baseline. He is awaiting placement. Referrals sent by SW.    Plan:  -continue inpatient psych admission; 15-minute checks; daily contact with patient to assess and evaluate symptoms and progress in treatment; psychoeducation.  -continue scheduled medications without changes today. Marland Kitchen aspirin EC  81 mg Oral Daily  . clonazePAM  1 mg Oral QID  . divalproex  1,500 mg  Oral QHS  . gabapentin  100 mg Oral TID  . influenza vac split quadrivalent PF  0.5 mL Intramuscular Tomorrow-1000  . lamoTRIgine  150 mg Oral QHS  . linagliptin  5 mg Oral Daily  . loratadine  10 mg Oral Daily  . metFORMIN  850 mg Oral BID WC  . metoprolol succinate  50 mg Oral Daily  . oxybutynin  10 mg Oral BID  . pantoprazole  40 mg Oral Daily  . prazosin  1 mg Oral QHS  . QUEtiapine  100 mg Oral QID  . QUEtiapine  400 mg Oral QHS  . sertraline  50 mg Oral BID  . simethicone  80 mg Oral TID  . simvastatin  20 mg Oral q1800  . tamsulosin  0.8 mg Oral QPC supper   -continue PRN medications. acetaminophen, alum & mag hydroxide-simeth, hydrOXYzine, loperamide, LORazepam, magnesium hydroxide, ondansetron  -Disposition: to be determined.  Continuing to search for placement.  Salley Scarlet, MD 05/31/2020, 11:41 AM

## 2020-05-31 NOTE — Progress Notes (Signed)
D- Patient alert and oriented. Affect/mood is anxious but cooperative. Pt denies SI, HI, AVH, and pain. Pt has been more anxious today regarding his feelings getting hurt by other patients. Pt was given emotional support and a PRN.   A- Scheduled medications administered to patient, per MD orders. Support and encouragement provided.  Routine safety checks conducted every 15 minutes.  Patient informed to notify staff with problems or concerns.  R- No adverse drug reactions noted. Patient contracts for safety at this time. Patient compliant with medications and treatment plan. Patient receptive, calm, and cooperative. Patient interacts well with others on the unit.  Patient remains safe at this time.  Collier Bullock RN

## 2020-05-31 NOTE — Progress Notes (Signed)
Patient remained in bed most of shift. He reports needing more medication due to his behavior earlier dealing with another peer. He was medication compliant sad and compliant. He appears to be in bed resting this time.

## 2020-05-31 NOTE — Plan of Care (Signed)
Problem: Education: Goal: Knowledge of [place] [facility] Education information/materials will improve Outcome: Progressing Goal: Emotional status will improve Outcome: Progressing Goal: Mental status will improve Outcome: Progressing Goal: Verbalization of understanding the information provided will improve Outcome: Progressing   Problem: Activity: Goal: Interest or engagement in activities will improve Outcome: Progressing Goal: Sleeping patterns will improve Outcome: Progressing   Problem: Coping: Goal: Ability to verbalize frustrations and anger appropriately will improve Outcome: Progressing Goal: Ability to demonstrate self-control will improve Outcome: Progressing   Problem: Health Behavior/Discharge Planning: Goal: Identification of resources available to assist in meeting health care needs will improve Outcome: Progressing Goal: Compliance with treatment plan for underlying cause of condition will improve Outcome: Progressing   Problem: Physical Regulation: Goal: Ability to maintain clinical measurements within normal limits will improve Outcome: Progressing   Problem: Safety: Goal: Periods of time without injury will increase Outcome: Progressing   Problem: Education: Goal: Knowledge of [place] [facility] Education information/materials will improve Outcome: Progressing Goal: Emotional status will improve Outcome: Progressing Goal: Mental status will improve Outcome: Progressing Goal: Verbalization of understanding the information provided will improve Outcome: Progressing   Problem: Activity: Goal: Interest or engagement in activities will improve Outcome: Progressing Goal: Sleeping patterns will improve Outcome: Progressing   Problem: Coping: Goal: Ability to verbalize frustrations and anger appropriately will improve Outcome: Progressing Goal: Ability to demonstrate self-control will improve Outcome: Progressing   Problem: Health  Behavior/Discharge Planning: Goal: Identification of resources available to assist in meeting health care needs will improve Outcome: Progressing Goal: Compliance with treatment plan for underlying cause of condition will improve Outcome: Progressing   Problem: Physical Regulation: Goal: Ability to maintain clinical measurements within normal limits will improve Outcome: Progressing   Problem: Safety: Goal: Periods of time without injury will increase Outcome: Progressing   Problem: Education: Goal: Knowledge of [place] [facility] Education information/materials will improve Outcome: Progressing Goal: Emotional status will improve Outcome: Progressing Goal: Mental status will improve Outcome: Progressing Goal: Verbalization of understanding the information provided will improve Outcome: Progressing   Problem: Activity: Goal: Interest or engagement in activities will improve Outcome: Progressing Goal: Sleeping patterns will improve Outcome: Progressing   Problem: Coping: Goal: Ability to verbalize frustrations and anger appropriately will improve Outcome: Progressing Goal: Ability to demonstrate self-control will improve Outcome: Progressing   Problem: Health Behavior/Discharge Planning: Goal: Identification of resources available to assist in meeting health care needs will improve Outcome: Progressing Goal: Compliance with treatment plan for underlying cause of condition will improve Outcome: Progressing   Problem: Physical Regulation: Goal: Ability to maintain clinical measurements within normal limits will improve Outcome: Progressing   Problem: Safety: Goal: Periods of time without injury will increase Outcome: Progressing   Problem: Education: Goal: Knowledge of [place] [facility] Education information/materials will improve Outcome: Progressing Goal: Emotional status will improve Outcome: Progressing Goal: Mental status will improve Outcome:  Progressing Goal: Verbalization of understanding the information provided will improve Outcome: Progressing   Problem: Activity: Goal: Interest or engagement in activities will improve Outcome: Progressing Goal: Sleeping patterns will improve Outcome: Progressing   Problem: Coping: Goal: Ability to verbalize frustrations and anger appropriately will improve Outcome: Progressing Goal: Ability to demonstrate self-control will improve Outcome: Progressing   Problem: Health Behavior/Discharge Planning: Goal: Identification of resources available to assist in meeting health care needs will improve Outcome: Progressing Goal: Compliance with treatment plan for underlying cause of condition will improve Outcome: Progressing   Problem: Physical Regulation: Goal: Ability to maintain clinical measurements within  normal limits will improve Outcome: Progressing   Problem: Safety: Goal: Periods of time without injury will increase Outcome: Progressing   Problem: Education: Goal: Ability to make informed decisions regarding treatment will improve Outcome: Progressing   Problem: Coping: Goal: Coping ability will improve Outcome: Progressing   Problem: Health Behavior/Discharge Planning: Goal: Identification of resources available to assist in meeting health care needs will improve Outcome: Progressing   Problem: Medication: Goal: Compliance with prescribed medication regimen will improve Outcome: Progressing   Problem: Self-Concept: Goal: Ability to disclose and discuss suicidal ideas will improve Outcome: Progressing Goal: Will verbalize positive feelings about self Outcome: Progressing

## 2020-06-01 NOTE — [facility] Counselor (Signed)
CSW attempted to contact Mr. Humphries about placement, however, unable to reach him.    CSW left HIPAA compliant voicemail.  Assunta Curtis, MSW, LCSW 06/01/2020 10:03 AM

## 2020-06-01 NOTE — Progress Notes (Signed)
Patient is in a solemn mood and has been isolating to his room this evening.  He denied SI  HI  AVH and pain at this encounter.  He does endorse anxiety and depression that he reports stems from other patients on the unit. He was anxious for his medication to help alleviate his symptoms at this encounter.  He is med compliant and tolerates his meds without incident,  He was encouraged to contact staff with any concerns and remains safe with a15 minute safety checks.   Cleo Butler-Nicholson, LPN

## 2020-06-01 NOTE — [facility] Counselor (Signed)
CSW again attempted to contact Alexander Beard, CSW called the group home and was informed that he had left the day.  CSW contacted his cell and received his voicemail.  CSW left HIPAA compliant voicemail.  Assunta Curtis, MSW, LCSW 06/01/2020 12:58 PM

## 2020-06-01 NOTE — Progress Notes (Signed)
[facility] MD Progress Note  06/01/2020 1:01 PM Alexander Beard  MRN:  [id number]  Principal Problem: Schizoaffective disorder, bipolar type ([place]) Diagnosis: Principal Problem:   Schizoaffective disorder, bipolar type ([place]) Active Problems:   Active autistic disorder   Diabetes ([place])   Hypertension   Prostate hypertrophy   Intellectual disability   At risk for elopement  Alexander Beard is a 64 y.o. male pt that has a previous psychiatric history of Schizoaffective disorder who presents to the [facility] unit for treatment of psychosis.  Interval History Patient was seen today for re-evaluation.  Nursing reports no events overnight. The patient reports no issues with performing ADLs.  Patient has been medication compliant.  The patient reports no side effects from medications.    SUBJECTIVE:  Patient seen one-on-one this morning.  He continues to feel down about a peer interacting with another peer. However, he is much more calm about the situation this morning.  He denies suicidal or homicidal thoughts. He denies any hallucinations at the time of the interview. He denies side effects from his current medications. He denies any physical complaints. He is hoping to leave the hospital soon to go to group home.   Total Time spent with patient: 30 minutes  Past Psychiatric History: see H&P   Past Medical History:  Past Medical History:  Diagnosis Date  . Anxiety   . Anxiety disorder due to known physiological condition    HOSPITALIZED 10/18  . Arthritis   . Autistic disorder, residual state   . COPD (chronic obstructive pulmonary disease) ([place])   . Depression   . Developmental disorder   . Diabetes mellitus without complication ([place])   . Dyslipidemia   . Esophageal reflux   . HOH (hard of hearing)    MILDLY  . Hypertension   . Obesity   . Overactive bladder   . Palpitations    ANXIETY  . Schizophrenia, schizoaffective ([place])   . Sleep apnea   . Tremors of nervous system    HANDS DUE TO  MEDICATIONS  . Urinary incontinence     Past Surgical History:  Procedure Laterality Date  . CATARACT EXTRACTION W/PHACO Left 11/14/2017   Procedure: CATARACT EXTRACTION PHACO AND INTRAOCULAR LENS PLACEMENT (IOC);  Surgeon: Birder Robson, MD;  Location: [facility] ORS;  Service: Ophthalmology;  Laterality: Left;  [place] 00:42 AP% 14.6 CDE 6.12 Fluid pack lot # [id number]  . COLONOSCOPY  01/28/2005  . COLONOSCOPY WITH PROPOFOL N/A 05/09/2016   Procedure: COLONOSCOPY WITH PROPOFOL;  Surgeon: Manya Silvas, MD;  Location: [facility] ENDOSCOPY;  Service: Endoscopy;  Laterality: N/A;  . ESOPHAGOGASTRODUODENOSCOPY N/A 05/09/2016   Procedure: ESOPHAGOGASTRODUODENOSCOPY (EGD);  Surgeon: Manya Silvas, MD;  Location: [facility] ENDOSCOPY;  Service: Endoscopy;  Laterality: N/A;  . FRACTURE SURGERY     ORIF SHOULDER  . TONSILLECTOMY     Family History:  Family History  Problem Relation Age of Onset  . Hypertension Mother   . Stroke Father   . Heart Problems Father    Family Psychiatric  History: see H&P Social History   Substance and Sexual Activity  Alcohol Use No  . Alcohol/week: 0.0 standard drinks     Social History   Substance and Sexual Activity  Drug Use No    Social History   Socioeconomic History  . Marital status: Single    Spouse name: Not on file  . Number of children: Not on file  . Years of education: Not on file  . Highest education level: Not on  file  Occupational History  . Not on file  Tobacco Use  . Smoking status: Never Smoker  . Smokeless tobacco: Never Used  Vaping Use  . Vaping Use: Never used  Substance and Sexual Activity  . Alcohol use: No    Alcohol/week: 0.0 standard drinks  . Drug use: No  . Sexual activity: Never  Other Topics Concern  . Not on file  Social History Narrative   The patient never finished high school but did get his GED. 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 He is currently in  disability and [name] is his legal guardian. He has been living in group homes for many years.      No pending legal charges   Social Determinants of Health   Financial Resource Strain:   . Difficulty of Paying Living Expenses: Not on file  Food Insecurity:   . Worried About Charity fundraiser in the Last Year: Not on file  . Ran Out of Food in the Last Year: Not on file  Transportation Needs:   . Lack of Transportation (Medical): Not on file  . Lack of Transportation (Non-Medical): Not on file  Physical Activity:   . Days of Exercise per Week: Not on file  . Minutes of Exercise per Session: Not on file  Stress:   . Feeling of Stress : Not on file  Social Connections:   . Frequency of Communication with Friends and Family: Not on file  . Frequency of Social Gatherings with Friends and Family: Not on file  . Attends Religious Services: Not on file  . Active Member of Clubs or Organizations: Not on file  . Attends Archivist Meetings: Not on file  . Marital Status: Not on file   Additional Social History:                         Sleep: Fair  Appetite:  Fair  Current Medications: Current Facility-Administered Medications  Medication Dose Route Frequency Provider Last Rate Last Admin  . acetaminophen (TYLENOL) tablet 650 mg  650 mg Oral Q6H PRN [name], MD   650 mg at 05/14/20 2122  . alum & mag hydroxide-simeth (MAALOX/MYLANTA) 200-200-20 MG/5ML suspension 30 mL  30 mL Oral Q4H PRN [name], MD      . aspirin EC tablet 81 mg  81 mg Oral Daily [name], [name], MD   81 mg at 06/01/20 6144  . clonazePAM (KLONOPIN) tablet 1 mg  1 mg Oral QID [name], [name], MD   1 mg at 06/01/20 1205  . divalproex (DEPAKOTE ER) 24 hr tablet 1,500 mg  1,500 mg Oral QHS [name], [name], MD   1,500 mg at 05/31/20 2127  . gabapentin (NEURONTIN) capsule 100 mg  100 mg Oral TID [name], [name], MD   100 mg at 06/01/20 1205  . hydrOXYzine  (ATARAX/VISTARIL) tablet 10 mg  10 mg Oral TID PRN [name], MD   10 mg at 05/31/20 1420  . influenza vac split quadrivalent PF (FLUARIX) injection 0.5 mL  0.5 mL Intramuscular Tomorrow-1000 [name], [name], MD      . lamoTRIgine (LAMICTAL) tablet 150 mg  150 mg Oral QHS Salley Scarlet, MD   150 mg at 05/31/20 2128  . linagliptin (TRADJENTA) tablet 5 mg  5 mg Oral Daily [name], [name], MD   5 mg  at 06/01/20 0821  . loperamide (IMODIUM) capsule 2 mg  2 mg Oral Q4H PRN [name], MD   2 mg at 04/10/20 0917  . loratadine (CLARITIN) tablet 10 mg  10 mg Oral Daily [name], [name], MD   10 mg at 06/01/20 0355  . LORazepam (ATIVAN) tablet 2 mg  2 mg Oral Q6H PRN Salley Scarlet, MD   2 mg at 05/30/20 1628  . magnesium hydroxide (MILK OF MAGNESIA) suspension 30 mL  30 mL Oral Daily PRN [name], MD   30 mL at 03/23/20 1344  . metFORMIN (GLUCOPHAGE) tablet 850 mg  850 mg Oral BID WC [name], [name], MD   850 mg at 06/01/20 0821  . metoprolol succinate (TOPROL-XL) 24 hr tablet 50 mg  50 mg Oral Daily [name], [name], MD   50 mg at 06/01/20 9741  . ondansetron (ZOFRAN) tablet 4 mg  4 mg Oral Q8H PRN [name], [name], MD   4 mg at 04/30/20 2035  . oxybutynin (DITROPAN) tablet 10 mg  10 mg Oral BID [name], [name], MD   10 mg at 06/01/20 6384  . pantoprazole (PROTONIX) EC tablet 40 mg  40 mg Oral Daily [name], [name], MD   40 mg at 06/01/20 5364  . prazosin (MINIPRESS) capsule 1 mg  1 mg Oral QHS [name], [name], MD   1 mg at 05/29/20 2029  . QUEtiapine (SEROQUEL) tablet 100 mg  100 mg Oral QID [name], [name], MD   100 mg at 06/01/20 1205  . QUEtiapine (SEROQUEL) tablet 400 mg  400 mg Oral QHS [name], NP   400 mg at 05/31/20 2127  . sertraline (ZOLOFT) tablet 50 mg  50 mg Oral BID [name], [name], MD   50 mg at 06/01/20 6803  . simethicone (MYLICON) chewable tablet 80 mg  80 mg Oral TID [name], [name], MD   80 mg at 06/01/20 1205  . simvastatin (ZOCOR) tablet 20 mg  20 mg  Oral q1800 [name], [name], MD   20 mg at 05/31/20 1720  . tamsulosin (FLOMAX) capsule 0.8 mg  0.8 mg Oral QPC supper [name], [name], MD   0.8 mg at 05/31/20 1720    Lab Results:  No results found for this or any previous visit (from the past 48 hour(s)).  Blood Alcohol level:  Lab Results  Component Value Date   ETH <10 01/22/2020   ETH <10 21/22/4825    Metabolic Disorder Labs: Lab Results  Component Value Date   HGBA1C 6.6 (H) 02/20/2020   MPG 142.72 02/20/2020   No results found for: PROLACTIN Lab Results  Component Value Date   CHOL 153 04/10/2015   TRIG 166 (H) 04/10/2015   HDL 50 04/10/2015   CHOLHDL 3.1 04/10/2015   VLDL 33 04/10/2015   LDLCALC 70 04/10/2015    Physical Findings: AIMS: Facial and Oral Movements Muscles of Facial Expression: None, normal Lips and Perioral Area: None, normal Jaw: None, normal Tongue: None, normal,Extremity Movements Upper (arms, wrists, hands, fingers): None, normal Lower (legs, knees, ankles, toes): None, normal, Trunk Movements Neck, shoulders, hips: None, normal, Overall Severity Severity of abnormal movements (highest score from questions above): None, normal Incapacitation due to abnormal movements: None, normal Patient's awareness of abnormal movements (rate only patient's report): No Awareness, Dental Status Current problems with teeth and/or dentures?: No Does patient usually wear dentures?: No  CIWA:    COWS:     Musculoskeletal: Strength &  Muscle Tone: within normal limits Gait & Station: normal Patient leans: N/A  Psychiatric Specialty Exam: Physical Exam Vitals and nursing note reviewed.  Constitutional:      Appearance: Normal appearance.  HENT:     Head: Normocephalic and atraumatic.     Right Ear: External ear normal.     Left Ear: External ear normal.     Nose: Nose normal.     Mouth/Throat:     Mouth: Mucous membranes are moist.     Pharynx: Oropharynx is clear.  Eyes:     Extraocular  Movements: Extraocular movements intact.     Conjunctiva/sclera: Conjunctivae normal.     Pupils: Pupils are equal, round, and reactive to light.  Cardiovascular:     Rate and Rhythm: Normal rate.     Pulses: Normal pulses.  Pulmonary:     Effort: Pulmonary effort is normal.     Breath sounds: Normal breath sounds.  Abdominal:     General: Abdomen is flat.     Palpations: Abdomen is soft.  Musculoskeletal:        General: No swelling. Normal range of motion.     Cervical back: Normal range of motion and neck supple.  Skin:    General: Skin is warm and dry.  Neurological:     General: No focal deficit present.     Mental Status: He is alert and oriented to person, place, and time.  Psychiatric:        Attention and Perception: Attention and perception normal.        Mood and Affect: Mood is anxious.        Speech: Speech normal.        Behavior: Behavior is cooperative.        Thought Content: Thought content is not paranoid. Thought content does not include homicidal or suicidal ideation.        Cognition and Memory: Cognition and memory normal.        Judgment: Judgment is impulsive.     Review of Systems  Constitutional: Negative for activity change and fatigue.  HENT: Negative for rhinorrhea and sore throat.   Eyes: Negative for photophobia and visual disturbance.  Respiratory: Negative for cough and shortness of breath.   Cardiovascular: Negative for chest pain and palpitations.  Gastrointestinal: Negative for constipation, diarrhea, nausea and vomiting.  Endocrine: Negative for cold intolerance and heat intolerance.  Genitourinary: Negative for difficulty urinating and dysuria.  Musculoskeletal: Negative for arthralgias and myalgias.  Skin: Negative for rash and wound.  Allergic/Immunologic: Negative for food allergies and immunocompromised state.  Neurological: Negative for dizziness and headaches.  Hematological: Negative for adenopathy. Does not bruise/bleed easily.   Psychiatric/Behavioral: Negative for hallucinations and suicidal ideas. The patient is not nervous/anxious.     Blood pressure 130/81, pulse 73, temperature 97.8 F (36.6 C), temperature source Oral, resp. rate 16, height 5\' 8"  (1.727 m), weight 104.3 kg, SpO2 100 %.Body mass index is 34.96 kg/m.  General Appearance: Fairly groomed  Eye Contact:  Fair  Speech:  Normal rate  Volume:  Normal  Mood:  Euthymic  Affect:  Constricted  Thought Process:  Coherent  Orientation:  Full (Time, Place, and Person)  Thought Content:  Logical  Suicidal Thoughts:  No  Homicidal Thoughts:  No  Memory:  NA  Judgement:  Impaired  Insight:  Shallow  Psychomotor Activity: wnl  Concentration:  Concentration: Poor and Attention Span: Poor  Recall:  [employer] of Knowledge:  Fair  Language:  Fair  Akathisia:  No  Handed:  Right  AIMS (if indicated):     Assets:  Communication Skills Desire for Improvement  ADL's:  Intact  Cognition:  Impaired,  Mild  Sleep:  Number of Hours: 6     Treatment Plan Summary: Daily contact with patient to assess and evaluate symptoms and progress in treatment and Medication management   Patient is a 64 year old male with the above-stated past psychiatric history who is seen in follow-up.  Chart reviewed. Patient discussed with nursing. Patient appears stable, likely at his mental baseline. He is awaiting placement. Referrals sent by SW.    Plan:  -continue inpatient psych admission; 15-minute checks; daily contact with patient to assess and evaluate symptoms and progress in treatment; psychoeducation.  -continue scheduled medications without changes today. Marland Kitchen aspirin EC  81 mg Oral Daily  . clonazePAM  1 mg Oral QID  . divalproex  1,500 mg Oral QHS  . gabapentin  100 mg Oral TID  . influenza vac split quadrivalent PF  0.5 mL Intramuscular Tomorrow-1000  . lamoTRIgine  150 mg Oral QHS  . linagliptin  5 mg Oral Daily  . loratadine  10 mg Oral Daily  .  metFORMIN  850 mg Oral BID WC  . metoprolol succinate  50 mg Oral Daily  . oxybutynin  10 mg Oral BID  . pantoprazole  40 mg Oral Daily  . prazosin  1 mg Oral QHS  . QUEtiapine  100 mg Oral QID  . QUEtiapine  400 mg Oral QHS  . sertraline  50 mg Oral BID  . simethicone  80 mg Oral TID  . simvastatin  20 mg Oral q1800  . tamsulosin  0.8 mg Oral QPC supper   -continue PRN medications. acetaminophen, alum & mag hydroxide-simeth, hydrOXYzine, loperamide, LORazepam, magnesium hydroxide, ondansetron  -Disposition: to be determined.  Continuing to search for placement.  Salley Scarlet, MD 06/01/2020, 1:01 PM

## 2020-06-01 NOTE — Plan of Care (Signed)
Problem: Education: Goal: Knowledge of [facility] Education information/materials will improve Outcome: Progressing Goal: Emotional status will improve Outcome: Progressing Goal: Mental status will improve Outcome: Progressing Goal: Verbalization of understanding the information provided will improve Outcome: Progressing   Problem: Activity: Goal: Interest or engagement in activities will improve Outcome: Progressing Goal: Sleeping patterns will improve Outcome: Progressing   Problem: Coping: Goal: Ability to verbalize frustrations and anger appropriately will improve Outcome: Progressing Goal: Ability to demonstrate self-control will improve Outcome: Progressing   Problem: Health Behavior/Discharge Planning: Goal: Identification of resources available to assist in meeting health care needs will improve Outcome: Progressing Goal: Compliance with treatment plan for underlying cause of condition will improve Outcome: Progressing   Problem: Physical Regulation: Goal: Ability to maintain clinical measurements within normal limits will improve Outcome: Progressing   Problem: Safety: Goal: Periods of time without injury will increase Outcome: Progressing   Problem: Education: Goal: Ability to make informed decisions regarding treatment will improve Outcome: Progressing   Problem: Coping: Goal: Coping ability will improve Outcome: Progressing   Problem: Health Behavior/Discharge Planning: Goal: Identification of resources available to assist in meeting health care needs will improve Outcome: Progressing   Problem: Medication: Goal: Compliance with prescribed medication regimen will improve Outcome: Progressing   Problem: Self-Concept: Goal: Ability to disclose and discuss suicidal ideas will improve Outcome: Progressing Goal: Will verbalize positive feelings about self Outcome: Progressing   Problem: Education: Goal: Knowledge of [facility]  Education information/materials will improve Outcome: Progressing Goal: Emotional status will improve Outcome: Progressing Goal: Mental status will improve Outcome: Progressing Goal: Verbalization of understanding the information provided will improve Outcome: Progressing   Problem: Activity: Goal: Interest or engagement in activities will improve Outcome: Progressing Goal: Sleeping patterns will improve Outcome: Progressing   Problem: Coping: Goal: Ability to verbalize frustrations and anger appropriately will improve Outcome: Progressing Goal: Ability to demonstrate self-control will improve Outcome: Progressing   Problem: Health Behavior/Discharge Planning: Goal: Identification of resources available to assist in meeting health care needs will improve Outcome: Progressing Goal: Compliance with treatment plan for underlying cause of condition will improve Outcome: Progressing   Problem: Physical Regulation: Goal: Ability to maintain clinical measurements within normal limits will improve Outcome: Progressing   Problem: Safety: Goal: Periods of time without injury will increase Outcome: Progressing   Problem: Education: Goal: Knowledge of [facility] Education information/materials will improve Outcome: Progressing Goal: Emotional status will improve Outcome: Progressing Goal: Mental status will improve Outcome: Progressing Goal: Verbalization of understanding the information provided will improve Outcome: Progressing   Problem: Activity: Goal: Interest or engagement in activities will improve Outcome: Progressing Goal: Sleeping patterns will improve Outcome: Progressing   Problem: Coping: Goal: Ability to verbalize frustrations and anger appropriately will improve Outcome: Progressing Goal: Ability to demonstrate self-control will improve Outcome: Progressing   Problem: Health Behavior/Discharge Planning: Goal: Identification of resources available  to assist in meeting health care needs will improve Outcome: Progressing Goal: Compliance with treatment plan for underlying cause of condition will improve Outcome: Progressing   Problem: Physical Regulation: Goal: Ability to maintain clinical measurements within normal limits will improve Outcome: Progressing   Problem: Safety: Goal: Periods of time without injury will increase Outcome: Progressing   Problem: Education: Goal: Knowledge of [facility] Education information/materials will improve Outcome: Progressing Goal: Emotional status will improve Outcome: Progressing Goal: Mental status will improve Outcome: Progressing Goal: Verbalization of understanding the information provided will improve Outcome: Progressing   Problem: Activity: Goal: Interest  or engagement in activities will improve Outcome: Progressing Goal: Sleeping patterns will improve Outcome: Progressing   Problem: Coping: Goal: Ability to verbalize frustrations and anger appropriately will improve Outcome: Progressing Goal: Ability to demonstrate self-control will improve Outcome: Progressing   Problem: Health Behavior/Discharge Planning: Goal: Identification of resources available to assist in meeting health care needs will improve Outcome: Progressing Goal: Compliance with treatment plan for underlying cause of condition will improve Outcome: Progressing   Problem: Physical Regulation: Goal: Ability to maintain clinical measurements within normal limits will improve Outcome: Progressing   Problem: Safety: Goal: Periods of time without injury will increase Outcome: Progressing

## 2020-06-01 NOTE — [facility] Group Notes (Signed)
LCSW Group Therapy Note   06/01/2020 2:48 PM  Type of Therapy and Topic:  Group Therapy:  Overcoming Obstacles   Participation Level:  Active   Description of Group:    In this group patients will be encouraged to explore what they see as obstacles to their own wellness and recovery. They will be guided to discuss their thoughts, feelings, and behaviors related to these obstacles. The group will process together ways to cope with barriers, with attention given to specific choices patients can make. Each patient will be challenged to identify changes they are motivated to make in order to overcome their obstacles. This group will be process-oriented, with patients participating in exploration of their own experiences as well as giving and receiving support and challenge from other group members.   Therapeutic Goals: 1. Patient will identify personal and current obstacles as they relate to admission. 2. Patient will identify barriers that currently interfere with their wellness or overcoming obstacles.  3. Patient will identify feelings, thought process and behaviors related to these barriers. 4. Patient will identify two changes they are willing to make to overcome these obstacles:      Summary of Patient Progress Pt came to the group and participated. However, his conversation had little to do with the topic at hand. He was easily redirectable though. Pt wanted to focus on how detrimental being in mental health institutions is for patients.     Therapeutic Modalities:   Cognitive Behavioral Therapy Solution Focused Therapy Motivational Interviewing Relapse Prevention Therapy  Hedy Camara R. [name], MSW, [place], [place] 06/01/2020 2:48 PM

## 2020-06-01 NOTE — [facility] Counselor (Signed)
CSW attempted to contact Renato Shin with [facility], [phone] to check if patient would be able to remain with this provider once he transitions to the new group home. CSW left a HIPAA compliant voicemail.  Assunta Curtis, MSW, LCSW 06/01/2020 1:03 PM

## 2020-06-01 NOTE — Plan of Care (Signed)
Patient went emotional and tearful and requested staff for prayer.Prayer and emotional support offered.Denies SI,HI and AVH.No somatic issues verbalized.Personal hygiene maintained.Attended groups.Compliant with medications.Appetite and energy level good.Support and encouragement given.

## 2020-06-01 NOTE — Progress Notes (Signed)
Recreation Therapy Notes   Date: 06/01/2020  Time: 9:30 am   Location: Craft room   Behavioral response: Appropriate  Intervention Topic: Self-esteem   Discussion/Intervention:  Group content today was focused on self-esteem. Patient defined self-esteem and where it comes form. The group described reasons self-esteem is important. Individuals stated things that impact self-esteem and positive ways to improve self-esteem. Patient went over the components of self-esteem. The group participated in the intervention where patients were able to use origami to identify positive ways to improve their self-esteem.  Clinical Observations/Feedback: Patient came to group  Late and defined self- esteem as not giving up. He explained that determination and behavior are components that make up self-esteem. Individual was social with peers and staff while participating in the intervention.  Lenox Bink LRT/CTRS         Miklos Bidinger 06/01/2020 12:54 PM

## 2020-06-02 NOTE — [facility] Group Notes (Signed)
LCSW Group Therapy Note  06/02/2020 1:00 PM  Type of Therapy/Topic:  Group Therapy:  Feelings about Diagnosis  Participation Level:  Active   Description of Group:   This group will allow patients to explore their thoughts and feelings about diagnoses they have received. Patients will be guided to explore their level of understanding and acceptance of these diagnoses. Facilitator will encourage patients to process their thoughts and feelings about the reactions of others to their diagnosis and will guide patients in identifying ways to discuss their diagnosis with significant others in their lives. This group will be process-oriented, with patients participating in exploration of their own experiences, giving and receiving support, and processing challenge from other group members.   Therapeutic Goals: 1. Patient will demonstrate understanding of diagnosis as evidenced by identifying two or more symptoms of the disorder 2. Patient will be able to express two feelings regarding the diagnosis 3. Patient will demonstrate their ability to communicate their needs through discussion and/or role play  Summary of Patient Progress: Patient was present in group. Patient was an active participant in group. Patient shared that he thinks that there is a difference between mental health and medical diagnoses.  Patient remained on topic and was clear in group.    Therapeutic Modalities:   Cognitive Behavioral Therapy Brief Therapy Feelings Identification   Assunta Curtis, MSW, LCSW 06/02/2020 11:13 AM

## 2020-06-02 NOTE — Progress Notes (Signed)
[facility] MD Progress Note  06/02/2020 11:14 AM RENNER SEBALD  MRN:  [id number]  Principal Problem: Schizoaffective disorder, bipolar type ([place]) Diagnosis: Principal Problem:   Schizoaffective disorder, bipolar type ([place]) Active Problems:   Active autistic disorder   Diabetes ([place])   Hypertension   Prostate hypertrophy   Intellectual disability   At risk for elopement  [name] is a 64 y.o. male pt that has a previous psychiatric history of Schizoaffective disorder who presents to the [facility] unit for treatment of psychosis.  Interval History Patient was seen today for re-evaluation.  Nursing reports no events overnight. The patient reports no issues with performing ADLs.  Patient has been medication compliant.  The patient reports no side effects from medications.    SUBJECTIVE:  Patient seen one-on-one this morning.  He continues to feel down about a peer not being his friend. However, he is much more calm about the situation this morning.  He denies suicidal or homicidal thoughts. He denies any hallucinations at the time of the interview. He denies side effects from his current medications. He denies any physical complaints. He is hoping to leave the hospital soon to go to group home.   Total Time spent with patient: 30 minutes  Past Psychiatric History: see H&P   Past Medical History:  Past Medical History:  Diagnosis Date  . Anxiety   . Anxiety disorder due to known physiological condition    HOSPITALIZED 10/18  . Arthritis   . Autistic disorder, residual state   . COPD (chronic obstructive pulmonary disease) ([place])   . Depression   . Developmental disorder   . Diabetes mellitus without complication ([place])   . Dyslipidemia   . Esophageal reflux   . HOH (hard of hearing)    MILDLY  . Hypertension   . Obesity   . Overactive bladder   . Palpitations    ANXIETY  . Schizophrenia, schizoaffective ([name])   . Sleep apnea   . Tremors of nervous system    HANDS DUE TO MEDICATIONS   . Urinary incontinence     Past Surgical History:  Procedure Laterality Date  . CATARACT EXTRACTION W/PHACO Left 11/14/2017   Procedure: CATARACT EXTRACTION PHACO AND INTRAOCULAR LENS PLACEMENT (IOC);  Surgeon: Birder Robson, MD;  Location: [facility] ORS;  Service: Ophthalmology;  Laterality: Left;  [place] 00:42 AP% 14.6 CDE 6.12 Fluid pack lot # [id number]  . COLONOSCOPY  01/28/2005  . COLONOSCOPY WITH PROPOFOL N/A 05/09/2016   Procedure: COLONOSCOPY WITH PROPOFOL;  Surgeon: Manya Silvas, MD;  Location: [facility] ENDOSCOPY;  Service: Endoscopy;  Laterality: N/A;  . ESOPHAGOGASTRODUODENOSCOPY N/A 05/09/2016   Procedure: ESOPHAGOGASTRODUODENOSCOPY (EGD);  Surgeon: Manya Silvas, MD;  Location: [facility] ENDOSCOPY;  Service: Endoscopy;  Laterality: N/A;  . FRACTURE SURGERY     ORIF SHOULDER  . TONSILLECTOMY     Family History:  Family History  Problem Relation Age of Onset  . Hypertension Mother   . Stroke Father   . Heart Problems Father    Family Psychiatric  History: see H&P Social History   Substance and Sexual Activity  Alcohol Use No  . Alcohol/week: 0.0 standard drinks     Social History   Substance and Sexual Activity  Drug Use No    Social History   Socioeconomic History  . Marital status: Single    Spouse name: Not on file  . Number of children: Not on file  . Years of education: Not on file  . Highest education level: Not on  file  Occupational History  . Not on file  Tobacco Use  . Smoking status: Never Smoker  . Smokeless tobacco: Never Used  Vaping Use  . Vaping Use: Never used  Substance and Sexual Activity  . Alcohol use: No    Alcohol/week: 0.0 standard drinks  . Drug use: No  . Sexual activity: Never  Other Topics Concern  . Not on file  Social History Narrative   The patient never finished high school but did get his GED. 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 He is currently in disability  and [name] is his legal guardian. He has been living in group homes for many years.      No pending legal charges   Social Determinants of Health   Financial Resource Strain:   . Difficulty of Paying Living Expenses: Not on file  Food Insecurity:   . Worried About Charity fundraiser in the Last Year: Not on file  . Ran Out of Food in the Last Year: Not on file  Transportation Needs:   . Lack of Transportation (Medical): Not on file  . Lack of Transportation (Non-Medical): Not on file  Physical Activity:   . Days of Exercise per Week: Not on file  . Minutes of Exercise per Session: Not on file  Stress:   . Feeling of Stress : Not on file  Social Connections:   . Frequency of Communication with Friends and Family: Not on file  . Frequency of Social Gatherings with Friends and Family: Not on file  . Attends Religious Services: Not on file  . Active Member of Clubs or Organizations: Not on file  . Attends Archivist Meetings: Not on file  . Marital Status: Not on file   Additional Social History:                         Sleep: Fair  Appetite:  Fair  Current Medications: Current Facility-Administered Medications  Medication Dose Route Frequency Provider Last Rate Last Admin  . acetaminophen (TYLENOL) tablet 650 mg  650 mg Oral Q6H PRN [name], MD   650 mg at 05/14/20 2122  . alum & mag hydroxide-simeth (MAALOX/MYLANTA) 200-200-20 MG/5ML suspension 30 mL  30 mL Oral Q4H PRN [name], MD      . aspirin EC tablet 81 mg  81 mg Oral Daily [name], [name], MD   81 mg at 06/02/20 0817  . clonazePAM (KLONOPIN) tablet 1 mg  1 mg Oral QID [name], [name], MD   1 mg at 06/02/20 0818  . divalproex (DEPAKOTE ER) 24 hr tablet 1,500 mg  1,500 mg Oral QHS [name], Alexander T, MD   1,500 mg at 06/01/20 2112  . gabapentin (NEURONTIN) capsule 100 mg  100 mg Oral TID [name], Alexander T, MD   100 mg at 06/02/20 0818  . hydrOXYzine (ATARAX/VISTARIL) tablet  10 mg  10 mg Oral TID PRN [name], MD   10 mg at 05/31/20 1420  . influenza vac split quadrivalent PF (FLUARIX) injection 0.5 mL  0.5 mL Intramuscular Tomorrow-1000 [name], Alexander T, MD      . lamoTRIgine (LAMICTAL) tablet 150 mg  150 mg Oral QHS Salley Scarlet, MD   150 mg at 06/01/20 2112  . linagliptin (TRADJENTA) tablet 5 mg  5 mg Oral Daily [name], [name], MD   5 mg  at 06/02/20 0818  . loperamide (IMODIUM) capsule 2 mg  2 mg Oral Q4H PRN [name], MD   2 mg at 04/10/20 0917  . loratadine (CLARITIN) tablet 10 mg  10 mg Oral Daily [name], [name], MD   10 mg at 06/02/20 0818  . LORazepam (ATIVAN) tablet 2 mg  2 mg Oral Q6H PRN Salley Scarlet, MD   2 mg at 05/30/20 1628  . magnesium hydroxide (MILK OF MAGNESIA) suspension 30 mL  30 mL Oral Daily PRN [name], MD   30 mL at 03/23/20 1344  . metFORMIN (GLUCOPHAGE) tablet 850 mg  850 mg Oral BID WC [name], [name], MD   850 mg at 06/02/20 0818  . metoprolol succinate (TOPROL-XL) 24 hr tablet 50 mg  50 mg Oral Daily [name], [name], MD   50 mg at 06/02/20 0818  . ondansetron (ZOFRAN) tablet 4 mg  4 mg Oral Q8H PRN [name], [name], MD   4 mg at 04/30/20 2035  . oxybutynin (DITROPAN) tablet 10 mg  10 mg Oral BID [name], [name], MD   10 mg at 06/02/20 0818  . pantoprazole (PROTONIX) EC tablet 40 mg  40 mg Oral Daily [name], [name], MD   40 mg at 06/02/20 0818  . prazosin (MINIPRESS) capsule 1 mg  1 mg Oral QHS [name], Alexander T, MD   1 mg at 05/29/20 2029  . QUEtiapine (SEROQUEL) tablet 100 mg  100 mg Oral QID [name], [name], MD   100 mg at 06/02/20 0819  . QUEtiapine (SEROQUEL) tablet 400 mg  400 mg Oral QHS [name], NP   400 mg at 06/01/20 2112  . sertraline (ZOLOFT) tablet 50 mg  50 mg Oral BID [name], [name], MD   50 mg at 06/02/20 0818  . simethicone (MYLICON) chewable tablet 80 mg  80 mg Oral TID [name], [name], MD   80 mg at 06/02/20 0818  . simvastatin (ZOCOR) tablet 20 mg  20 mg Oral q1800 [name], [name], MD   20 mg at 06/01/20 1714  . tamsulosin (FLOMAX) capsule 0.8 mg  0.8 mg Oral QPC supper [name], [name], MD   0.8 mg at 06/01/20 1714    Lab Results:  No results found for this or any previous visit (from the past 48 hour(s)).  Blood Alcohol level:  Lab Results  Component Value Date   ETH <10 01/22/2020   ETH <10 24/26/8341    Metabolic Disorder Labs: Lab Results  Component Value Date   HGBA1C 6.6 (H) 02/20/2020   MPG 142.72 02/20/2020   No results found for: PROLACTIN Lab Results  Component Value Date   CHOL 153 04/10/2015   TRIG 166 (H) 04/10/2015   HDL 50 04/10/2015   CHOLHDL 3.1 04/10/2015   VLDL 33 04/10/2015   LDLCALC 70 04/10/2015    Physical Findings: AIMS: Facial and Oral Movements Muscles of Facial Expression: None, normal Lips and Perioral Area: None, normal Jaw: None, normal Tongue: None, normal,Extremity Movements Upper (arms, wrists, hands, fingers): None, normal Lower (legs, knees, ankles, toes): None, normal, Trunk Movements Neck, shoulders, hips: None, normal, Overall Severity Severity of abnormal movements (highest score from questions above): None, normal Incapacitation due to abnormal movements: None, normal Patient's awareness of abnormal movements (rate only patient's report): No Awareness, Dental Status Current problems with teeth and/or dentures?: No Does patient usually wear dentures?: No  CIWA:    COWS:     Musculoskeletal: Strength &  Muscle Tone: within normal limits Gait & Station: normal Patient leans: N/A  Psychiatric Specialty Exam: Physical Exam Vitals and nursing note reviewed.  Constitutional:      Appearance: Normal appearance.  HENT:     Head: Normocephalic and atraumatic.     Right Ear: External ear normal.     Left Ear: External ear normal.     Nose: Nose normal.     Mouth/Throat:     Mouth: Mucous membranes are moist.     Pharynx: Oropharynx is clear.  Eyes:     Extraocular Movements: Extraocular movements  intact.     Conjunctiva/sclera: Conjunctivae normal.     Pupils: Pupils are equal, round, and reactive to light.  Cardiovascular:     Rate and Rhythm: Normal rate.     Pulses: Normal pulses.  Pulmonary:     Effort: Pulmonary effort is normal.     Breath sounds: Normal breath sounds.  Abdominal:     General: Abdomen is flat.     Palpations: Abdomen is soft.  Musculoskeletal:        General: No swelling. Normal range of motion.     Cervical back: Normal range of motion and neck supple.  Skin:    General: Skin is warm and dry.  Neurological:     General: No focal deficit present.     Mental Status: He is alert and oriented to person, place, and time.  Psychiatric:        Attention and Perception: Attention and perception normal.        Mood and Affect: Mood is anxious.        Speech: Speech normal.        Behavior: Behavior is cooperative.        Thought Content: Thought content is not paranoid. Thought content does not include homicidal or suicidal ideation.        Cognition and Memory: Cognition and memory normal.        Judgment: Judgment is impulsive.     Review of Systems  Constitutional: Negative for activity change and fatigue.  HENT: Negative for rhinorrhea and sore throat.   Eyes: Negative for photophobia and visual disturbance.  Respiratory: Negative for cough and shortness of breath.   Cardiovascular: Negative for chest pain and palpitations.  Gastrointestinal: Negative for constipation, diarrhea, nausea and vomiting.  Endocrine: Negative for cold intolerance and heat intolerance.  Genitourinary: Negative for difficulty urinating and dysuria.  Musculoskeletal: Negative for arthralgias and myalgias.  Skin: Negative for rash and wound.  Allergic/Immunologic: Negative for food allergies and immunocompromised state.  Neurological: Negative for dizziness and headaches.  Hematological: Negative for adenopathy. Does not bruise/bleed easily.  Psychiatric/Behavioral:  Negative for hallucinations and suicidal ideas. The patient is not nervous/anxious.     Blood pressure 128/77, pulse 67, temperature 97.9 F (36.6 C), temperature source Oral, resp. rate 17, height 5\' 8"  (1.727 Beard), weight 104.3 kg, SpO2 100 %.Body mass index is 34.96 kg/Beard.  General Appearance: Fairly groomed  Eye Contact:  Fair  Speech:  Normal rate  Volume:  Normal  Mood:  Anxious  Affect:  Congruent  Thought Process:  Coherent  Orientation:  Full (Time, Place, and Person)  Thought Content:  Logical  Suicidal Thoughts:  No  Homicidal Thoughts:  No  Memory:  NA  Judgement:  Impaired  Insight:  Shallow  Psychomotor Activity: wnl  Concentration:  Concentration: Poor and Attention Span: Poor  Recall:  [employer] of Knowledge:  Fair  Language:  Fair  Akathisia:  No  Handed:  Right  AIMS (if indicated):     Assets:  Communication Skills Desire for Improvement  ADL's:  Intact  Cognition:  Impaired,  Mild  Sleep:  Number of Hours: 7.45     Treatment Plan Summary: Daily contact with patient to assess and evaluate symptoms and progress in treatment and Medication management   Patient is a 64 year old male with the above-stated past psychiatric history who is seen in follow-up.  Chart reviewed. Patient discussed with nursing. Patient appears stable, likely at his mental baseline. He is awaiting placement. Referrals sent by SW.    Plan:  -continue inpatient psych admission; 15-minute checks; daily contact with patient to assess and evaluate symptoms and progress in treatment; psychoeducation.  -continue scheduled medications without changes today. Marland Kitchen aspirin EC  81 mg Oral Daily  . clonazePAM  1 mg Oral QID  . divalproex  1,500 mg Oral QHS  . gabapentin  100 mg Oral TID  . influenza vac split quadrivalent PF  0.5 mL Intramuscular Tomorrow-1000  . lamoTRIgine  150 mg Oral QHS  . linagliptin  5 mg Oral Daily  . loratadine  10 mg Oral Daily  . metFORMIN  850 mg Oral BID WC  .  metoprolol succinate  50 mg Oral Daily  . oxybutynin  10 mg Oral BID  . pantoprazole  40 mg Oral Daily  . prazosin  1 mg Oral QHS  . QUEtiapine  100 mg Oral QID  . QUEtiapine  400 mg Oral QHS  . sertraline  50 mg Oral BID  . simethicone  80 mg Oral TID  . simvastatin  20 mg Oral q1800  . tamsulosin  0.8 mg Oral QPC supper   -continue PRN medications. acetaminophen, alum & mag hydroxide-simeth, hydrOXYzine, loperamide, LORazepam, magnesium hydroxide, ondansetron  -Disposition: to be determined.  Continuing to search for placement.  Salley Scarlet, MD 06/02/2020, 11:14 AM

## 2020-06-02 NOTE — Progress Notes (Signed)
  Chaplain On-Call received page from [employer] who reported the patient's request to speak with a Chaplain.  Chaplain met with patient in the Group Room. Chaplain provided much spiritual and emotional support through listening to patient's descriptions of this lengthy hospitalization. Patient stated his frustration about not being able to develop a friendship with a fellow patient. Patient also stated that he is frustrated about what he perceives as a lack of resources for mental health care outside the hospital setting. 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  Chaplain offered encouragement and support, and will refer to Chaplain Resident Bary Richard for follow-up.  Clinton Darnelle Catalan., [facility]

## 2020-06-02 NOTE — Plan of Care (Signed)
Patient looks frustrated and irritable this morning states " I am not getting the help I need from here.I need to get professional help from outside." When writer asked what kind of help patient states " I cannot tell you my problems." Listen to patient and emotional support provided. Patient looks calm and relaxed this afternoon.ADLs maintained.Appetite and energy level good.Attended groups.Support and encouragement given.

## 2020-06-02 NOTE — [facility] Counselor (Signed)
CSW has attempted to contact Mr. Humphries several times.  CSW left HIPAA compliant voicemail.  Assunta Curtis, MSW, LCSW 06/02/2020 12:05 PM

## 2020-06-02 NOTE — Plan of Care (Signed)
Problem: Education: Goal: Knowledge of [place] [facility] Education information/materials will improve Outcome: Progressing Goal: Emotional status will improve Outcome: Progressing Goal: Mental status will improve Outcome: Progressing Goal: Verbalization of understanding the information provided will improve Outcome: Progressing   Problem: Activity: Goal: Interest or engagement in activities will improve Outcome: Progressing Goal: Sleeping patterns will improve Outcome: Progressing   Problem: Coping: Goal: Ability to verbalize frustrations and anger appropriately will improve Outcome: Progressing Goal: Ability to demonstrate self-control will improve Outcome: Progressing   Problem: Health Behavior/Discharge Planning: Goal: Identification of resources available to assist in meeting health care needs will improve Outcome: Progressing Goal: Compliance with treatment plan for underlying cause of condition will improve Outcome: Progressing   Problem: Physical Regulation: Goal: Ability to maintain clinical measurements within normal limits will improve Outcome: Progressing   Problem: Safety: Goal: Periods of time without injury will increase Outcome: Progressing   Problem: Education: Goal: Ability to make informed decisions regarding treatment will improve Outcome: Progressing   Problem: Coping: Goal: Coping ability will improve Outcome: Progressing   Problem: Health Behavior/Discharge Planning: Goal: Identification of resources available to assist in meeting health care needs will improve Outcome: Progressing   Problem: Medication: Goal: Compliance with prescribed medication regimen will improve Outcome: Progressing   Problem: Self-Concept: Goal: Ability to disclose and discuss suicidal ideas will improve Outcome: Progressing Goal: Will verbalize positive feelings about self Outcome: Progressing   Problem: Education: Goal: Knowledge of [place] [facility]  Education information/materials will improve Outcome: Progressing Goal: Emotional status will improve Outcome: Progressing Goal: Mental status will improve Outcome: Progressing Goal: Verbalization of understanding the information provided will improve Outcome: Progressing   Problem: Activity: Goal: Interest or engagement in activities will improve Outcome: Progressing Goal: Sleeping patterns will improve Outcome: Progressing   Problem: Coping: Goal: Ability to verbalize frustrations and anger appropriately will improve Outcome: Progressing Goal: Ability to demonstrate self-control will improve Outcome: Progressing   Problem: Health Behavior/Discharge Planning: Goal: Identification of resources available to assist in meeting health care needs will improve Outcome: Progressing Goal: Compliance with treatment plan for underlying cause of condition will improve Outcome: Progressing   Problem: Physical Regulation: Goal: Ability to maintain clinical measurements within normal limits will improve Outcome: Progressing   Problem: Safety: Goal: Periods of time without injury will increase Outcome: Progressing   Problem: Education: Goal: Knowledge of [place] [facility] Education information/materials will improve Outcome: Progressing Goal: Emotional status will improve Outcome: Progressing Goal: Mental status will improve Outcome: Progressing Goal: Verbalization of understanding the information provided will improve Outcome: Progressing   Problem: Activity: Goal: Interest or engagement in activities will improve Outcome: Progressing Goal: Sleeping patterns will improve Outcome: Progressing   Problem: Coping: Goal: Ability to verbalize frustrations and anger appropriately will improve Outcome: Progressing Goal: Ability to demonstrate self-control will improve Outcome: Progressing   Problem: Health Behavior/Discharge Planning: Goal: Identification of resources available  to assist in meeting health care needs will improve Outcome: Progressing Goal: Compliance with treatment plan for underlying cause of condition will improve Outcome: Progressing   Problem: Physical Regulation: Goal: Ability to maintain clinical measurements within normal limits will improve Outcome: Progressing   Problem: Safety: Goal: Periods of time without injury will increase Outcome: Progressing   Problem: Education: Goal: Knowledge of [place] [facility] Education information/materials will improve Outcome: Progressing Goal: Emotional status will improve Outcome: Progressing Goal: Mental status will improve Outcome: Progressing Goal: Verbalization of understanding the information provided will improve Outcome: Progressing   Problem: Activity: Goal: Interest  or engagement in activities will improve Outcome: Progressing Goal: Sleeping patterns will improve Outcome: Progressing   Problem: Coping: Goal: Ability to verbalize frustrations and anger appropriately will improve Outcome: Progressing Goal: Ability to demonstrate self-control will improve Outcome: Progressing   Problem: Health Behavior/Discharge Planning: Goal: Identification of resources available to assist in meeting health care needs will improve Outcome: Progressing Goal: Compliance with treatment plan for underlying cause of condition will improve Outcome: Progressing   Problem: Physical Regulation: Goal: Ability to maintain clinical measurements within normal limits will improve Outcome: Progressing   Problem: Safety: Goal: Periods of time without injury will increase Outcome: Progressing

## 2020-06-02 NOTE — Progress Notes (Signed)
Thao was pleasant with anxious affect at this encounter. He denies SI  HI  AVH and pain.  He does endorse depression and and anxiety that stems from current situations in his life. He is med compliant and tolerated his meds without incident. He has been very needy this evening approaching the med station requesting one on one consults with nursing staff and returning minutes later asking to be seen again. He is easily redirectable, and receptive to emotional support. He is safe on the unit with 15 minute safe checks and encouraged to contact staff with his concerns.      Cleo Butler-Nicholson, LPN

## 2020-06-02 NOTE — Progress Notes (Signed)
Recreation Therapy Notes    Date: 06/02/2020  Time: 9:30 am   Location: Craft room     Behavioral response: N/A   Intervention Topic: Teamwork    Discussion/Intervention: Patient did not attend group.   Clinical Observations/Feedback:  Patient did not attend group.   Quanika Solem LRT/CTRS        Natelie Ostrosky 06/02/2020 11:59 AM

## 2020-06-03 LAB — SARS CORONAVIRUS 2 BY RT PCR (HOSPITAL ORDER, PERFORMED IN ~~LOC~~ HOSPITAL LAB): SARS Coronavirus 2: NEGATIVE

## 2020-06-03 MED ORDER — TUBERCULIN PPD 5 UNIT/0.1ML ID SOLN
5.0000 [IU] | Freq: Once | INTRADERMAL | Status: AC
  Administered 2020-06-03: 5 [IU] via INTRADERMAL
  Filled 2020-06-03 (×3): qty 0.1

## 2020-06-03 NOTE — [facility] Counselor (Signed)
CSW received a call from Mr. Humphries. Patient will transition to his home on Monday. CSW will call on Friday to schedule a time for pick up. Patient will need a new Covid test and TB test.   Assunta Curtis, MSW, LCSW 06/03/2020 10:43 AM

## 2020-06-03 NOTE — Progress Notes (Signed)
D: Pt alert and oriented. Pt rates depression 0/10, and anxiety 0/10. Pt reports energy level as normal and concentration as being good. Pt reports sleep last night as being good. Pt did not receive medications for sleep. Pt denies experiencing any pain at this time. Pt denies experiencing any SI/HI, or AVH at this time.   A: Scheduled medications administered to pt, per MD orders. Support and encouragement provided. Frequent verbal contact made. Routine safety checks conducted q15 minutes.   R: No adverse drug reactions noted. Pt verbally contracts for safety at this time. Pt complaint with medications and treatment plan. Pt interacts well with others on the unit. Pt remains safe at this time. Will continue to monitor.

## 2020-06-03 NOTE — Progress Notes (Signed)
Recreation Therapy Notes  Date: 06/03/2020  Time: 9:30 am   Location: Craft room     Behavioral response: N/A   Intervention Topic: Leisure     Discussion/Intervention: Patient did not attend group.   Clinical Observations/Feedback:  Patient did not attend group.   Nelvin Tomb LRT/CTRS         Lahari Suttles 06/03/2020 11:43 AM

## 2020-06-03 NOTE — Tx Team (Signed)
Interdisciplinary Treatment and Diagnostic Plan Update  06/03/2020 Time of Session: 8:30 AM  Alexander Beard MRN: [id number]  Principal Diagnosis: Schizoaffective disorder, bipolar type ([place])  Secondary Diagnoses: Principal Problem:   Schizoaffective disorder, bipolar type ([place]) Active Problems:   Active autistic disorder   Diabetes ([place])   Hypertension   Prostate hypertrophy   Intellectual disability   At risk for elopement   Current Medications:  Current Facility-Administered Medications  Medication Dose Route Frequency Provider Last Rate Last Admin  . acetaminophen (TYLENOL) tablet 650 mg  650 mg Oral Q6H PRN [name], MD   650 mg at 05/14/20 2122  . alum & mag hydroxide-simeth (MAALOX/MYLANTA) 200-200-20 MG/5ML suspension 30 mL  30 mL Oral Q4H PRN [name], MD      . aspirin EC tablet 81 mg  81 mg Oral Daily [name], [name], MD   81 mg at 06/03/20 0818  . clonazePAM (KLONOPIN) tablet 1 mg  1 mg Oral QID [name], [name], MD   1 mg at 06/03/20 0818  . divalproex (DEPAKOTE ER) 24 hr tablet 1,500 mg  1,500 mg Oral QHS [name], [name], MD   1,500 mg at 06/02/20 2109  . gabapentin (NEURONTIN) capsule 100 mg  100 mg Oral TID [name], [name], MD   100 mg at 06/03/20 0818  . hydrOXYzine (ATARAX/VISTARIL) tablet 10 mg  10 mg Oral TID PRN [name], MD   10 mg at 05/31/20 1420  . influenza vac split quadrivalent PF (FLUARIX) injection 0.5 mL  0.5 mL Intramuscular Tomorrow-1000 [name], [name], MD      . lamoTRIgine (LAMICTAL) tablet 150 mg  150 mg Oral QHS Salley Scarlet, MD   150 mg at 06/02/20 2109  . linagliptin (TRADJENTA) tablet 5 mg  5 mg Oral Daily [name], [name], MD   5 mg at 06/03/20 0818  . loperamide (IMODIUM) capsule 2 mg  2 mg Oral Q4H PRN [name], MD   2 mg at 04/10/20 0917  . loratadine (CLARITIN) tablet 10 mg  10 mg Oral Daily [name], [name], MD   10 mg at 06/03/20 0818  . LORazepam (ATIVAN) tablet 2 mg  2 mg Oral Q6H PRN Salley Scarlet, MD    2 mg at 05/30/20 1628  . magnesium hydroxide (MILK OF MAGNESIA) suspension 30 mL  30 mL Oral Daily PRN [name], MD   30 mL at 03/23/20 1344  . metFORMIN (GLUCOPHAGE) tablet 850 mg  850 mg Oral BID WC [name], [name], MD   850 mg at 06/03/20 0819  . metoprolol succinate (TOPROL-XL) 24 hr tablet 50 mg  50 mg Oral Daily [name], [name], MD   50 mg at 06/03/20 0818  . ondansetron (ZOFRAN) tablet 4 mg  4 mg Oral Q8H PRN [name], [name], MD   4 mg at 04/30/20 2035  . oxybutynin (DITROPAN) tablet 10 mg  10 mg Oral BID [name], [name], MD   10 mg at 06/03/20 0819  . pantoprazole (PROTONIX) EC tablet 40 mg  40 mg Oral Daily [name], [name], MD   40 mg at 06/03/20 0818  . prazosin (MINIPRESS) capsule 1 mg  1 mg Oral QHS [name], [name], MD   1 mg at 05/29/20 2029  . QUEtiapine (SEROQUEL) tablet 100 mg  100 mg Oral QID [name], [name], MD   100 mg at 06/03/20 0818  . QUEtiapine (SEROQUEL) tablet 400 mg  400 mg Oral QHS [name], NP  400 mg at 06/02/20 2110  . sertraline (ZOLOFT) tablet 50 mg  50 mg Oral BID [name], [name], MD   50 mg at 06/03/20 0819  . simethicone (MYLICON) chewable tablet 80 mg  80 mg Oral TID [name], [name], MD   80 mg at 06/03/20 0818  . simvastatin (ZOCOR) tablet 20 mg  20 mg Oral q1800 [name], [name], MD   20 mg at 06/02/20 1711  . tamsulosin (FLOMAX) capsule 0.8 mg  0.8 mg Oral QPC supper [name], [name], MD   0.8 mg at 06/02/20 1711  . tuberculin injection 5 Units  5 Units Intradermal Once Salley Scarlet, MD       PTA Medications: Medications Prior to Admission  Medication Sig Dispense Refill Last Dose  . acetaminophen (TYLENOL) 650 MG CR tablet Take 650 mg by mouth every 12 (twelve) hours as needed for pain.     Marland Kitchen aspirin EC 81 MG tablet Take 81 mg by mouth daily.     . Calcium Carbonate-Vitamin D3 (CALCIUM 600-D) 600-400 MG-UNIT TABS Take 1 tablet by mouth 2 (two) times daily.     . Cholecalciferol (VITAMIN D-1000 MAX ST) 1000 UNITS tablet Take 1,000  Units by mouth daily.      Marland Kitchen docusate sodium (COLACE) 100 MG capsule Take 100 mg by mouth daily.      . furosemide (LASIX) 20 MG tablet Take 20 mg by mouth daily.      Marland Kitchen gabapentin (NEURONTIN) 100 MG capsule Take 1 capsule (100 mg total) by mouth 3 (three) times daily. 90 capsule 10   . Insulin Detemir (LEVEMIR FLEXTOUCH) 100 UNIT/ML Pen Inject 15 Units into the skin daily at 10 pm.     . lamoTRIgine (LAMICTAL) 100 MG tablet Take 1 tablet (100 mg total) by mouth at bedtime. 30 tablet 10   . loratadine (CLARITIN) 10 MG tablet Take 1 tablet (10 mg total) by mouth daily. 30 tablet 5   . LORazepam (ATIVAN) 1 MG tablet Take 1 tablet (1 mg total) by mouth 3 (three) times daily. 90 tablet 5   . lurasidone (LATUDA) 40 MG TABS tablet Take 1 tablet (40 mg total) by mouth daily. with food 30 tablet 10   . metoprolol succinate (TOPROL-XL) 50 MG 24 hr tablet Take 50 mg by mouth daily.      . NON FORMULARY cpap device     . oxybutynin (DITROPAN) 5 MG tablet Take 5 mg by mouth daily.     . pantoprazole (PROTONIX) 40 MG tablet Take 40 mg by mouth daily.     . polyethylene glycol (MIRALAX / GLYCOLAX) packet Take 17 g by mouth daily.     . QUEtiapine (SEROQUEL) 100 MG tablet TAKE 1 TABLET  BY MOUTH THREE TIMES A DAY (Patient taking differently: Take 100 mg by mouth 3 (three) times daily. TAKE 1 TABLET  BY MOUTH THREE TIMES A DAY) 90 tablet 11   . QUEtiapine (SEROQUEL) 400 MG tablet Take 1 tablet (400 mg total) by mouth at bedtime. 30 tablet 10   . sertraline (ZOLOFT) 100 MG tablet Take 2 tablets (200 mg total) by mouth daily. (Patient taking differently: Take 200 mg by mouth daily. Take along with two 50 mg tablets (100 mg) for total 300 mg daily) 60 tablet 5   . sertraline (ZOLOFT) 50 MG tablet Take 2 tablets (100 mg total) by mouth daily. (Patient taking differently: Take 100 mg by mouth daily. Take along with two 100 mg tablets (  200 mg) for total 300 mg daily) 60 tablet 5   . simethicone (MYLICON) 80 MG chewable  tablet Chew 80-160 mg by mouth 2 (two) times daily as needed for flatulence.     . simvastatin (ZOCOR) 20 MG tablet Take 20 mg by mouth at bedtime.      . sitaGLIPtin (JANUVIA) 100 MG tablet Take 100 mg by mouth daily.     . solifenacin (VESICARE) 5 MG tablet Take 5 mg by mouth daily.     . Starch (HEMORRHOIDAL RE) Place 1 application rectally 4 (four) times daily as needed (for itching).     . tamsulosin (FLOMAX) 0.4 MG CAPS capsule Take 0.4 mg by mouth daily.       Patient Stressors: Health problems Marital or family conflict Medication change or noncompliance Traumatic event  Patient Strengths: Motivation for treatment/growth Religious Affiliation  Treatment Modalities: Medication Management, Group therapy, Case management,  1 to 1 session with clinician, Psychoeducation, Recreational therapy.   Physician Treatment Plan for Primary Diagnosis: Schizoaffective disorder, bipolar type ([place]) Long Term Goal(s): Improvement in symptoms so as ready for discharge Improvement in symptoms so as ready for discharge   Short Term Goals: Ability to verbalize feelings will improve Ability to demonstrate self-control will improve Ability to maintain clinical measurements within normal limits will improve Compliance with prescribed medications will improve  Medication Management: Evaluate patient's response, side effects, and tolerance of medication regimen.  Therapeutic Interventions: 1 to 1 sessions, Unit Group sessions and Medication administration.  Evaluation of Outcomes: Progressing  Physician Treatment Plan for Secondary Diagnosis: Principal Problem:   Schizoaffective disorder, bipolar type ([place]) Active Problems:   Active autistic disorder   Diabetes ([place])   Hypertension   Prostate hypertrophy   Intellectual disability   At risk for elopement  Long Term Goal(s): Improvement in symptoms so as ready for discharge Improvement in symptoms so as ready for discharge   Short Term  Goals: Ability to verbalize feelings will improve Ability to demonstrate self-control will improve Ability to maintain clinical measurements within normal limits will improve Compliance with prescribed medications will improve     Medication Management: Evaluate patient's response, side effects, and tolerance of medication regimen.  Therapeutic Interventions: 1 to 1 sessions, Unit Group sessions and Medication administration.  Evaluation of Outcomes: Progressing   RN Treatment Plan for Primary Diagnosis: Schizoaffective disorder, bipolar type ([place]) Long Term Goal(s): Knowledge of disease and therapeutic regimen to maintain health will improve  Short Term Goals: Ability to demonstrate self-control, Ability to participate in decision making will improve, Ability to verbalize feelings will improve, Ability to identify and develop effective coping behaviors will improve and Compliance with prescribed medications will improve  Medication Management: RN will administer medications as ordered by provider, will assess and evaluate patient's response and provide education to patient for prescribed medication. RN will report any adverse and/or side effects to prescribing provider.  Therapeutic Interventions: 1 on 1 counseling sessions, Psychoeducation, Medication administration, Evaluate responses to treatment, Monitor vital signs and CBGs as ordered, Perform/monitor CIWA, COWS, AIMS and Fall Risk screenings as ordered, Perform wound care treatments as ordered.  Evaluation of Outcomes: Progressing   LCSW Treatment Plan for Primary Diagnosis: Schizoaffective disorder, bipolar type ([place]) Long Term Goal(s): Safe transition to appropriate next level of care at discharge, Engage patient in therapeutic group addressing interpersonal concerns.  Short Term Goals: Engage patient in aftercare planning with referrals and resources, Increase social support, Increase ability to appropriately verbalize feelings,  Increase emotional regulation  and Increase skills for wellness and recovery  Therapeutic Interventions: Assess for all discharge needs, 1 to 1 time with Social worker, Explore available resources and support systems, Assess for adequacy in community support network, Educate family and significant other(s) on suicide prevention, Complete Psychosocial Assessment, Interpersonal group therapy.  Evaluation of Outcomes: Progressing   Progress in Treatment: Attending groups: Yes. Participating in groups: Yes. Taking medication as prescribed: Yes. Toleration medication: Yes. Family/Significant other contact made: Yes, individual(s) contacted:  [place], brother  Patient understands diagnosis: Yes. Discussing patient identified problems/goals with staff: Yes. Medical problems stabilized or resolved: Yes. Denies suicidal/homicidal ideation: Yes. Issues/concerns per patient self-inventory: No. Other: N/A  New problem(s) identified: No, Describe:  None   New Short Term/Long Term Goal(s): Elimination of symptoms of psychosis, medication management for mood stabilization; elimination of SI thoughts; development of comprehensive mental wellness/sobriety plan.Update 03/01/20:No changes at this time. 03/07/20: Update, no changes at this time Update 03/12/2020: No changes at this time.Update 03/17/2020:No changes at this time. Update 03/21/20:No changes at this time. Update 03/26/20: No changes at this time. Update 03/31/2020: No changes at this time.Update 04/04/20: No changes at this time Update 04/09/2020: No changes at this time. Update 04/14/2020: No changes at this time. Update 04/20/2020:No changes at this time. Update: 04/25/2020: No changes at this time. Update 04/30/2020: No changes at this time.Update 05/05/2020: No changes at this time. Update 05/09/20: No changes at this time. Update 05/14/20: No changes at this time.Update 05/19/2020: No changes at this time. Update 05/23/20: No changes at  this time. Update 05/29/20:  No changes at this time. Update 06/03/2020:  No updates at this time.   Patient Goals:  Patient stated he would like to go back to [place] and reconnect with his peer support. Patient also stated that he would like to increase his social support and help his brother.Update 03/07/20,no change Update 03/12/2020: No changes at this time. Update 03/17/2020:No changes at this time.Update 03/21/20: No changes at this time. Update 03/26/20: No changes at this time. Update 03/31/2020: No changes at this time. Update9/25/21:No changes at this time. Update 04/09/2020: No changes at this time. Update 04/14/2020: No changes at this time. Update 04/20/2020:No changes at this time. 04/25/2020: Patient still indicates that he would like to go back to his old ways and life. Patient would like to get back into the community. 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 Update 04/30/2020: No changes at this time.Update 05/05/2020: No changes at this time. Patient maintains that he wants to get back to his old way of life, volunteer in the community, do his artwork, and help his brother. Update 05/09/20: No changes at this time. Update 05/14/20: No changes at this time.  Update 05/19/2020: No changes at this time. Update 05/23/20: No changes at this time. Update 05/29/20: No changes at this time.  Update 06/03/2020:  No updates at this time.    Discharge Plan or Barriers: Patient has an interview with a group home on 03/09/20 at 2:00 PM. Update 03/12/2020: CSW continues to assist the patient in developing a housing plan.Update 03/17/2020:CSW has assisted patient in completing an interview with a group home, however, there has been no word on if the patient has been approved. Patient continues to struggle with his anxiety and nerves. Nurses report that he has increased in bed wetting. Psychiatrist and nurses believe that this may be related to his increased anxiety about finding a home.Update  03/21/20: Patient is still nervous and anxious about  finding a new placement. Patient stated he could not stay here too much longer and wanted his life back. Update 03/26/20: CSW continues to look for placement for pt. Group Homes, [facility] are being considered. Update 03/31/2020: Patient continues to have interview with group homes, however, no success at this time in identifying a home that has accepted him.Update: 04/04/20:No changes at this time. Update 04/09/2020: CSW continues to look for placement for patient. Update 04/14/2020: No changes at this time. CSW continues to look for placement. Update 04/20/2020:CSW continues to assist with placement needs.04/25/2020: CSW continues to look for placement options. Update 04/30/2020: CSW continues to look for placement for the patient. Patient has been declined for 17 SNF's however, there are a quite a few where the referral is pending. CSW has contacted several group homes, however, none have selected the patient. Update 05/05/2020: CSW continues to look for placement for the patient. Patient has been declined at 48 SNF's with quite a few referrals left pending. CSW also sent out referrals for memory care facilities. CSW continues to contact groups homes without any selecting the patient at the moment. Update 05/09/20: CSW will continue to find appropriate placement for patient. Update 05/14/20: CSW will continue to find appropriate placement for patient.  Update 05/19/2020: CSW will continue to assist patient in identifying appropriate placement. Update 05/23/2020: CSW will continue to assist patient in identifying appropriate placement. Update 05/29/20: CSW has found group home placement. Pt brother would like to speak to group home owner and states he has been in contact with [facility] regarding the [facility]. CSW has verified that [facility] does not have a guaranteed bed for pt and was encouraged to go with  group home which accepted pt. As of this moment, CSW has been unable to reach brother to facilitate conference call between group home and brother.  Update 06/03/2020:  CSW has spoken with Mr. Humphries, who reports that the patient will be able to transition to his group home on Monday 06/08/2020.  Patient is need of Covid test and new TB test.    Reason for Continuation of Hospitalization: Depression Medication stabilization  Estimated Length of Stay: TBD  Attendees: Patient:  06/03/2020 10:51 AM  Physician: Selina Cooley, MD 06/03/2020 10:51 AM  Nursing:  06/03/2020 10:51 AM  RN Care Manager: 06/03/2020 10:51 AM  Social Worker: Assunta Curtis, MSW, LCSW 06/03/2020 10:51 AM  Recreational Therapist:  06/03/2020 10:51 AM  Other: Tera Mater, MSW  06/03/2020 10:51 AM  Other: Chalmers Guest. [name], MSW, [place], [place] 06/03/2020 10:51 AM  Other: 06/03/2020 10:51 AM    Scribe for Treatment Team: [name], LCSW 06/03/2020 10:51 AM

## 2020-06-03 NOTE — [facility] Counselor (Signed)
CSW spoke with Lorriane Shire with the patient's ACTT team.  Per Lorriane Shire the recommendation is that the patient have a CCA addendum completed to assess if pt is needing a higher level of care.  She hypothesizes that the patient may need CST.  CSW will update patient about this.  Assunta Curtis, MSW, LCSW 06/03/2020 1:08 PM

## 2020-06-03 NOTE — Progress Notes (Signed)
Patient alert and oriented affect is blunted thoughts are disorganized, his speech is illogical at times he seemed infatuated and obsessed with a peers on the unit, and he had a breakdown in the dayroom because he felt the other patient didn't like him enough. Patient was offered emotional support and set limits with what kind of relationship he can have with other patient while on the unit. Patient wasn't receptive to staff, he was anxious , irritable and angry. Patient was given scheduled night medication, 15 minutes safety checks maintained will continue to monitor.

## 2020-06-03 NOTE — Progress Notes (Signed)
[facility] MD Progress Note  06/03/2020 12:44 PM Alexander Beard  MRN:  [id number]  Principal Problem: Schizoaffective disorder, bipolar type ([place]) Diagnosis: Principal Problem:   Schizoaffective disorder, bipolar type ([place]) Active Problems:   Active autistic disorder   Diabetes ([place])   Hypertension   Prostate hypertrophy   Intellectual disability   At risk for elopement  Alexander Beard is a 64 y.o. male pt that has a previous psychiatric history of Schizoaffective disorder who presents [facility] for treatment of psychosis.  Interval History Patient was seen today for re-evaluation.  Nursing reports no events overnight. The patient reports no issues with performing ADLs.  Patient has been medication compliant.  The patient reports no side effects from medications.    SUBJECTIVE:  Patient seen one-on-one this morning.  He is very excited about his acceptance to group home, and discharge on Monday. He is looking forward to regaining some freedoms upon leaving the hospital. He speaks about wanting to gain more independence, but still retain some of his childlike joy. He says his brother and niece have agreed to have a celebration dinner with him after discharge, and he is very excited about this. He is also looking forward to the [employer] for Thanksgiving. He apologizes to me today about his outburst yesterday, and notes he feels embarrassed. He states he was able to calm himself down overnight with deep breathing exercises in his room. He feels today will be a much better day.  He denies suicidal or homicidal thoughts. He denies any hallucinations.He denies side effects from his current medications. He denies any physical complaints.   Total Time spent with patient: 30 minutes  Past Psychiatric History: see H&P   Past Medical History:  Past Medical History:  Diagnosis Date  . Anxiety   . Anxiety disorder due to known physiological condition    HOSPITALIZED 10/18  . Arthritis   .  Autistic disorder, residual state   . COPD (chronic obstructive pulmonary disease) ([place])   . Depression   . Developmental disorder   . Diabetes mellitus without complication ([place])   . Dyslipidemia   . Esophageal reflux   . HOH (hard of hearing)    MILDLY  . Hypertension   . Obesity   . Overactive bladder   . Palpitations    ANXIETY  . Schizophrenia, schizoaffective ([place])   . Sleep apnea   . Tremors of nervous system    HANDS DUE TO MEDICATIONS  . Urinary incontinence     Past Surgical History:  Procedure Laterality Date  . CATARACT EXTRACTION W/PHACO Left 11/14/2017   Procedure: CATARACT EXTRACTION PHACO AND INTRAOCULAR LENS PLACEMENT (IOC);  Surgeon: Birder Robson, MD;  Location: [facility] ORS;  Service: Ophthalmology;  Laterality: Left;  [place] 00:42 AP% 14.6 CDE 6.12 Fluid pack lot # [id number]  . COLONOSCOPY  01/28/2005  . COLONOSCOPY WITH PROPOFOL N/A 05/09/2016   Procedure: COLONOSCOPY WITH PROPOFOL;  Surgeon: Manya Silvas, MD;  Location: [facility] ENDOSCOPY;  Service: Endoscopy;  Laterality: N/A;  . ESOPHAGOGASTRODUODENOSCOPY N/A 05/09/2016   Procedure: ESOPHAGOGASTRODUODENOSCOPY (EGD);  Surgeon: Manya Silvas, MD;  Location: [facility] ENDOSCOPY;  Service: Endoscopy;  Laterality: N/A;  . FRACTURE SURGERY     ORIF SHOULDER  . TONSILLECTOMY     Family History:  Family History  Problem Relation Age of Onset  . Hypertension Mother   . Stroke Father   . Heart Problems Father    Family Psychiatric  History: see H&P Social History   Substance  and Sexual Activity  Alcohol Use No  . Alcohol/week: 0.0 standard drinks     Social History   Substance and Sexual Activity  Drug Use No    Social History   Socioeconomic History  . Marital status: Single    Spouse name: Not on file  . Number of children: Not on file  . Years of education: Not on file  . Highest education level: Not on file  Occupational History  . Not on file  Tobacco Use  . Smoking status: Never Smoker  .  Smokeless tobacco: Never Used  Vaping Use  . Vaping Use: Never used  Substance and Sexual Activity  . Alcohol use: No    Alcohol/week: 0.0 standard drinks  . Drug use: No  . Sexual activity: Never  Other Topics Concern  . Not on file  Social History Narrative   The patient never finished high school but did get his GED. 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 He is currently in disability and his brother Alexander Beard is his legal guardian. He has been living in group homes for many years.      No pending legal charges   Social Determinants of Health   Financial Resource Strain:   . Difficulty of Paying Living Expenses: Not on file  Food Insecurity:   . Worried About Charity fundraiser in the Last Year: Not on file  . Ran Out of Food in the Last Year: Not on file  Transportation Needs:   . Lack of Transportation (Medical): Not on file  . Lack of Transportation (Non-Medical): Not on file  Physical Activity:   . Days of Exercise per Week: Not on file  . Minutes of Exercise per Session: Not on file  Stress:   . Feeling of Stress : Not on file  Social Connections:   . Frequency of Communication with Friends and Family: Not on file  . Frequency of Social Gatherings with Friends and Family: Not on file  . Attends Religious Services: Not on file  . Active Member of Clubs or Organizations: Not on file  . Attends Archivist Meetings: Not on file  . Marital Status: Not on file   Additional Social History:                         Sleep: Fair  Appetite:  Fair  Current Medications: Current Facility-Administered Medications  Medication Dose Route Frequency Provider Last Rate Last Admin  . acetaminophen (TYLENOL) tablet 650 mg  650 mg Oral Q6H PRN [name], MD   650 mg at 05/14/20 2122  . alum & mag hydroxide-simeth (MAALOX/MYLANTA) 200-200-20 MG/5ML suspension 30 mL  30 mL Oral Q4H PRN [name], MD      . aspirin EC tablet 81 mg  81 mg Oral Daily [name], [name], MD   81 mg at 06/03/20 0818  . clonazePAM (KLONOPIN) tablet 1 mg  1 mg Oral QID [name], [name], MD   1 mg at 06/03/20 1156  . divalproex (DEPAKOTE ER) 24 hr tablet 1,500 mg  1,500 mg Oral QHS [name], [name], MD   1,500 mg at 06/02/20 2109  . gabapentin (NEURONTIN) capsule 100 mg  100 mg Oral TID [name], [name], MD   100 mg at 06/03/20 1156  . hydrOXYzine (ATARAX/VISTARIL) tablet 10 mg  10 mg Oral TID PRN [name], MD  10 mg at 05/31/20 1420  . influenza vac split quadrivalent PF (FLUARIX) injection 0.5 mL  0.5 mL Intramuscular Tomorrow-1000 [name], [name], MD      . lamoTRIgine (LAMICTAL) tablet 150 mg  150 mg Oral QHS Salley Scarlet, MD   150 mg at 06/02/20 2109  . linagliptin (TRADJENTA) tablet 5 mg  5 mg Oral Daily [name], [name], MD   5 mg at 06/03/20 0818  . loperamide (IMODIUM) capsule 2 mg  2 mg Oral Q4H PRN [name], MD   2 mg at 04/10/20 0917  . loratadine (CLARITIN) tablet 10 mg  10 mg Oral Daily [name], [name], MD   10 mg at 06/03/20 0818  . LORazepam (ATIVAN) tablet 2 mg  2 mg Oral Q6H PRN Salley Scarlet, MD   2 mg at 05/30/20 1628  . magnesium hydroxide (MILK OF MAGNESIA) suspension 30 mL  30 mL Oral Daily PRN [name], MD   30 mL at 03/23/20 1344  . metFORMIN (GLUCOPHAGE) tablet 850 mg  850 mg Oral BID WC [name], [name], MD   850 mg at 06/03/20 0819  . metoprolol succinate (TOPROL-XL) 24 hr tablet 50 mg  50 mg Oral Daily [name], [name], MD   50 mg at 06/03/20 0818  . ondansetron (ZOFRAN) tablet 4 mg  4 mg Oral Q8H PRN [name], [name], MD   4 mg at 04/30/20 2035  . oxybutynin (DITROPAN) tablet 10 mg  10 mg Oral BID [name], [name], MD   10 mg at 06/03/20 0819  . pantoprazole (PROTONIX) EC tablet 40 mg  40 mg Oral Daily [name], [name], MD   40 mg at 06/03/20 0818  . prazosin (MINIPRESS) capsule 1 mg  1 mg Oral QHS [name], [name], MD   1 mg at 05/29/20 2029  . QUEtiapine  (SEROQUEL) tablet 100 mg  100 mg Oral QID [name], [name], MD   100 mg at 06/03/20 1156  . QUEtiapine (SEROQUEL) tablet 400 mg  400 mg Oral QHS [name], NP   400 mg at 06/02/20 2110  . sertraline (ZOLOFT) tablet 50 mg  50 mg Oral BID [name], [name], MD   50 mg at 06/03/20 0819  . simethicone (MYLICON) chewable tablet 80 mg  80 mg Oral TID [name], [name], MD   80 mg at 06/03/20 1156  . simvastatin (ZOCOR) tablet 20 mg  20 mg Oral q1800 [name], [name], MD   20 mg at 06/02/20 1711  . tamsulosin (FLOMAX) capsule 0.8 mg  0.8 mg Oral QPC supper [name], [name], MD   0.8 mg at 06/02/20 1711  . tuberculin injection 5 Units  5 Units Intradermal Once Salley Scarlet, MD        Lab Results:  No results found for this or any previous visit (from the past 48 hour(s)).  Blood Alcohol level:  Lab Results  Component Value Date   ETH <10 01/22/2020   ETH <10 02/21/4817    Metabolic Disorder Labs: Lab Results  Component Value Date   HGBA1C 6.6 (H) 02/20/2020   MPG 142.72 02/20/2020   No results found for: PROLACTIN Lab Results  Component Value Date   CHOL 153 04/10/2015   TRIG 166 (H) 04/10/2015   HDL 50 04/10/2015   CHOLHDL 3.1 04/10/2015   VLDL 33 04/10/2015   LDLCALC 70 04/10/2015    Physical Findings: AIMS: Facial and Oral Movements Muscles of Facial Expression: None, normal Lips and  Perioral Area: None, normal Jaw: None, normal Tongue: None, normal,Extremity Movements Upper (arms, wrists, hands, fingers): None, normal Lower (legs, knees, ankles, toes): None, normal, Trunk Movements Neck, shoulders, hips: None, normal, Overall Severity Severity of abnormal movements (highest score from questions above): None, normal Incapacitation due to abnormal movements: None, normal Patient's awareness of abnormal movements (rate only patient's report): No Awareness, Dental Status Current problems with teeth and/or dentures?: No Does patient usually wear dentures?: No  CIWA:     COWS:     Musculoskeletal: Strength & Muscle Tone: within normal limits Gait & Station: normal Patient leans: N/A  Psychiatric Specialty Exam: Physical Exam Vitals and nursing note reviewed.  Constitutional:      Appearance: Normal appearance.  HENT:     Head: Normocephalic and atraumatic.     Right Ear: External ear normal.     Left Ear: External ear normal.     Nose: Nose normal.     Mouth/Throat:     Mouth: Mucous membranes are moist.     Pharynx: Oropharynx is clear.  Eyes:     Extraocular Movements: Extraocular movements intact.     Conjunctiva/sclera: Conjunctivae normal.     Pupils: Pupils are equal, round, and reactive to light.  Cardiovascular:     Rate and Rhythm: Normal rate.     Pulses: Normal pulses.  Pulmonary:     Effort: Pulmonary effort is normal.     Breath sounds: Normal breath sounds.  Abdominal:     General: Abdomen is flat.     Palpations: Abdomen is soft.  Musculoskeletal:        General: No swelling. Normal range of motion.     Cervical back: Normal range of motion and neck supple.  Skin:    General: Skin is warm and dry.  Neurological:     General: No focal deficit present.     Mental Status: He is alert and oriented to person, place, and time.  Psychiatric:        Attention and Perception: Attention and perception normal.        Mood and Affect: Mood is anxious.        Speech: Speech normal.        Behavior: Behavior is cooperative.        Thought Content: Thought content is not paranoid. Thought content does not include homicidal or suicidal ideation.        Cognition and Memory: Cognition and memory normal.        Judgment: Judgment is impulsive.     Review of Systems  Constitutional: Negative for activity change and fatigue.  HENT: Negative for rhinorrhea and sore throat.   Eyes: Negative for photophobia and visual disturbance.  Respiratory: Negative for cough and shortness of breath.   Cardiovascular: Negative for chest pain and  palpitations.  Gastrointestinal: Negative for constipation, diarrhea, nausea and vomiting.  Endocrine: Negative for cold intolerance and heat intolerance.  Genitourinary: Negative for difficulty urinating and dysuria.  Musculoskeletal: Negative for arthralgias and myalgias.  Skin: Negative for rash and wound.  Allergic/Immunologic: Negative for food allergies and immunocompromised state.  Neurological: Negative for dizziness and headaches.  Hematological: Negative for adenopathy. Does not bruise/bleed easily.  Psychiatric/Behavioral: Negative for hallucinations and suicidal ideas. The patient is not nervous/anxious.     Blood pressure 134/78, pulse 66, temperature 97.9 F (36.6 C), temperature source Oral, resp. rate 17, height 5\' 8"  (1.727 m), weight 104.3 kg, SpO2 100 %.Body mass index is 34.96 kg/m.  General Appearance: Fairly groomed  Eye Contact:  Fair  Speech:  Normal rate  Volume:  Normal  Mood:  Anxious  Affect:  Congruent  Thought Process:  Coherent  Orientation:  Full (Time, Place, and Person)  Thought Content:  Logical  Suicidal Thoughts:  No  Homicidal Thoughts:  No  Memory:  NA  Judgement:  Impaired  Insight:  Shallow  Psychomotor Activity: wnl  Concentration:  Concentration: Poor and Attention Span: Poor  Recall:  [employer] of Knowledge:  Fair  Language:  Fair  Akathisia:  No  Handed:  Right  AIMS (if indicated):     Assets:  Communication Skills Desire for Improvement  ADL's:  Intact  Cognition:  Impaired,  Mild  Sleep:  Number of Hours: 3.5     Treatment Plan Summary: Daily contact with patient to assess and evaluate symptoms and progress in treatment and Medication management   Patient is a 64 year old male with the above-stated past psychiatric history who is seen in follow-up.  Chart reviewed. Patient discussed with nursing. Patient appears stable, likely at his mental baseline. He is awaiting placement. Referrals sent by SW.     Plan:  -continue inpatient psych admission; 15-minute checks; daily contact with patient to assess and evaluate symptoms and progress in treatment; psychoeducation.  -continue scheduled medications without changes today. Marland Kitchen aspirin EC  81 mg Oral Daily  . clonazePAM  1 mg Oral QID  . divalproex  1,500 mg Oral QHS  . gabapentin  100 mg Oral TID  . influenza vac split quadrivalent PF  0.5 mL Intramuscular Tomorrow-1000  . lamoTRIgine  150 mg Oral QHS  . linagliptin  5 mg Oral Daily  . loratadine  10 mg Oral Daily  . metFORMIN  850 mg Oral BID WC  . metoprolol succinate  50 mg Oral Daily  . oxybutynin  10 mg Oral BID  . pantoprazole  40 mg Oral Daily  . prazosin  1 mg Oral QHS  . QUEtiapine  100 mg Oral QID  . QUEtiapine  400 mg Oral QHS  . sertraline  50 mg Oral BID  . simethicone  80 mg Oral TID  . simvastatin  20 mg Oral q1800  . tamsulosin  0.8 mg Oral QPC supper  . tuberculin  5 Units Intradermal Once   -continue PRN medications. acetaminophen, alum & mag hydroxide-simeth, hydrOXYzine, loperamide, LORazepam, magnesium hydroxide, ondansetron  -Disposition: Discharge Monday to group home.   Salley Scarlet, MD 06/03/2020, 12:44 PM

## 2020-06-04 DIAGNOSIS — F250 Schizoaffective disorder, bipolar type: Secondary | ICD-10-CM | POA: Diagnosis not present

## 2020-06-04 NOTE — Progress Notes (Signed)
[facility] MD Progress Note  06/04/2020 6:47 AM Alexander Beard  MRN:  [id number] Subjective: Follow-up for this 64 year old man with a history of schizoaffective disorder and autistic spectrum disorder.  Patient seen chart reviewed.  Patient with no new complaints.  He is aware that there is a plan for discharge most likely on Monday and is agreeable to it.  Staff reports he still occasionally gets anxious and jittery but has not shown any serious behavior problems.  Slept well through the night. Principal Problem: Schizoaffective disorder, bipolar type ([place]) Diagnosis: Principal Problem:   Schizoaffective disorder, bipolar type ([place]) Active Problems:   Active autistic disorder   Diabetes ([place])   Hypertension   Prostate hypertrophy   Intellectual disability   At risk for elopement  Total Time spent with patient: 30 minutes  Past Psychiatric History: Past history of longstanding disability intermittent behavior problems gradual improvement with some control with medicine  Past Medical History:  Past Medical History:  Diagnosis Date  . Anxiety   . Anxiety disorder due to known physiological condition    HOSPITALIZED 10/18  . Arthritis   . Autistic disorder, residual state   . COPD (chronic obstructive pulmonary disease) ([place])   . Depression   . Developmental disorder   . Diabetes mellitus without complication ([place])   . Dyslipidemia   . Esophageal reflux   . HOH (hard of hearing)    MILDLY  . Hypertension   . Obesity   . Overactive bladder   . Palpitations    ANXIETY  . Schizophrenia, schizoaffective ([place])   . Sleep apnea   . Tremors of nervous system    HANDS DUE TO MEDICATIONS  . Urinary incontinence     Past Surgical History:  Procedure Laterality Date  . CATARACT EXTRACTION W/PHACO Left 11/14/2017   Procedure: CATARACT EXTRACTION PHACO AND INTRAOCULAR LENS PLACEMENT (IOC);  Surgeon: Birder Robson, MD;  Location: [facility] ORS;  Service: Ophthalmology;  Laterality: Left;  [place]  00:42 AP% 14.6 CDE 6.12 Fluid pack lot # [id number]  . COLONOSCOPY  01/28/2005  . COLONOSCOPY WITH PROPOFOL N/A 05/09/2016   Procedure: COLONOSCOPY WITH PROPOFOL;  Surgeon: Manya Silvas, MD;  Location: [facility] ENDOSCOPY;  Service: Endoscopy;  Laterality: N/A;  . ESOPHAGOGASTRODUODENOSCOPY N/A 05/09/2016   Procedure: ESOPHAGOGASTRODUODENOSCOPY (EGD);  Surgeon: Manya Silvas, MD;  Location: [facility] ENDOSCOPY;  Service: Endoscopy;  Laterality: N/A;  . FRACTURE SURGERY     ORIF SHOULDER  . TONSILLECTOMY     Family History:  Family History  Problem Relation Age of Onset  . Hypertension Mother   . Stroke Father   . Heart Problems Father    Family Psychiatric  History: None reported Social History:  Social History   Substance and Sexual Activity  Alcohol Use No  . Alcohol/week: 0.0 standard drinks     Social History   Substance and Sexual Activity  Drug Use No    Social History   Socioeconomic History  . Marital status: Single    Spouse name: Not on file  . Number of children: Not on file  . Years of education: Not on file  . Highest education level: Not on file  Occupational History  . Not on file  Tobacco Use  . Smoking status: Never Smoker  . Smokeless tobacco: Never Used  Vaping Use  . Vaping Use: Never used  Substance and Sexual Activity  . Alcohol use: No    Alcohol/week: 0.0 standard drinks  . Drug use: No  . Sexual  activity: Never  Other Topics Concern  . Not on file  Social History Narrative   The patient never finished high school but did get his GED. 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 He is currently in disability and [name] is his legal guardian. He has been living in group homes for many years.      No pending legal charges   Social Determinants of Health   Financial Resource Strain:   . Difficulty of Paying Living Expenses: Not on file  Food Insecurity:   . Worried  About Charity fundraiser in the Last Year: Not on file  . Ran Out of Food in the Last Year: Not on file  Transportation Needs:   . Lack of Transportation (Medical): Not on file  . Lack of Transportation (Non-Medical): Not on file  Physical Activity:   . Days of Exercise per Week: Not on file  . Minutes of Exercise per Session: Not on file  Stress:   . Feeling of Stress : Not on file  Social Connections:   . Frequency of Communication with Friends and Family: Not on file  . Frequency of Social Gatherings with Friends and Family: Not on file  . Attends Religious Services: Not on file  . Active Member of Clubs or Organizations: Not on file  . Attends Archivist Meetings: Not on file  . Marital Status: Not on file   Additional Social History:                         Sleep: Good  Appetite:  Good  Current Medications: Current Facility-Administered Medications  Medication Dose Route Frequency Provider Last Rate Last Admin  . acetaminophen (TYLENOL) tablet 650 mg  650 mg Oral Q6H PRN [name], MD   650 mg at 05/14/20 2122  . alum & mag hydroxide-simeth (MAALOX/MYLANTA) 200-200-20 MG/5ML suspension 30 mL  30 mL Oral Q4H PRN [name], MD      . aspirin EC tablet 81 mg  81 mg Oral Daily Bailee Metter, [name], MD   81 mg at 06/03/20 0818  . clonazePAM (KLONOPIN) tablet 1 mg  1 mg Oral QID Tannar Broker, [name], MD   1 mg at 06/03/20 2107  . divalproex (DEPAKOTE ER) 24 hr tablet 1,500 mg  1,500 mg Oral QHS Starling Christofferson T, MD   1,500 mg at 06/03/20 2106  . gabapentin (NEURONTIN) capsule 100 mg  100 mg Oral TID Kathy Wahid T, MD   100 mg at 06/03/20 1636  . hydrOXYzine (ATARAX/VISTARIL) tablet 10 mg  10 mg Oral TID PRN [name], MD   10 mg at 05/31/20 1420  . influenza vac split quadrivalent PF (FLUARIX) injection 0.5 mL  0.5 mL Intramuscular Tomorrow-1000 Duru Reiger T, MD      . lamoTRIgine (LAMICTAL) tablet 150 mg  150 mg Oral QHS Salley Scarlet, MD    150 mg at 06/03/20 2106  . linagliptin (TRADJENTA) tablet 5 mg  5 mg Oral Daily Breah Joa, [name], MD   5 mg at 06/03/20 0818  . loperamide (IMODIUM) capsule 2 mg  2 mg Oral Q4H PRN [name], MD   2 mg at 04/10/20 0917  . loratadine (CLARITIN) tablet 10 mg  10 mg Oral Daily Zenita Kister, [name], MD   10 mg at 06/03/20 0818  . LORazepam (ATIVAN) tablet 2 mg  2  mg Oral Q6H PRN Salley Scarlet, MD   2 mg at 06/03/20 1440  . magnesium hydroxide (MILK OF MAGNESIA) suspension 30 mL  30 mL Oral Daily PRN [name], MD   30 mL at 03/23/20 1344  . metFORMIN (GLUCOPHAGE) tablet 850 mg  850 mg Oral BID WC Oshay Stranahan, [name], MD   850 mg at 06/03/20 1636  . metoprolol succinate (TOPROL-XL) 24 hr tablet 50 mg  50 mg Oral Daily Baila Rouse, [name], MD   50 mg at 06/03/20 0818  . ondansetron (ZOFRAN) tablet 4 mg  4 mg Oral Q8H PRN Elanor Cale, [name], MD   4 mg at 04/30/20 2035  . oxybutynin (DITROPAN) tablet 10 mg  10 mg Oral BID Shawonda Kerce, [name], MD   10 mg at 06/03/20 1717  . pantoprazole (PROTONIX) EC tablet 40 mg  40 mg Oral Daily Lycan Davee, [name], MD   40 mg at 06/03/20 0818  . prazosin (MINIPRESS) capsule 1 mg  1 mg Oral QHS Ryley Teater T, MD   1 mg at 05/29/20 2029  . QUEtiapine (SEROQUEL) tablet 100 mg  100 mg Oral QID Paysen Goza, [name], MD   100 mg at 06/03/20 2107  . QUEtiapine (SEROQUEL) tablet 400 mg  400 mg Oral QHS [name], NP   400 mg at 06/03/20 2107  . sertraline (ZOLOFT) tablet 50 mg  50 mg Oral BID Ranessa Kosta, [name], MD   50 mg at 06/03/20 1635  . simethicone (MYLICON) chewable tablet 80 mg  80 mg Oral TID Leitha Hyppolite, [name], MD   80 mg at 06/03/20 1635  . simvastatin (ZOCOR) tablet 20 mg  20 mg Oral q1800 Aleyna Cueva, [name], MD   20 mg at 06/03/20 1717  . tamsulosin (FLOMAX) capsule 0.8 mg  0.8 mg Oral QPC supper Kearsten Ginther T, MD   0.8 mg at 06/03/20 1717  . tuberculin injection 5 Units  5 Units Intradermal Once Salley Scarlet, MD   5 Units at 06/03/20 1432    Lab Results:  Results for  orders placed or performed during the hospital encounter of 02/19/20 (from the past 48 hour(s))  SARS Coronavirus 2 by RT PCR (hospital order, performed in [facility] hospital lab) Nasopharyngeal Nasopharyngeal Swab     Status: None   Collection Time: 06/03/20 11:24 AM   Specimen: Nasopharyngeal Swab  Result Value Ref Range   SARS Coronavirus 2 NEGATIVE NEGATIVE    Comment: (NOTE) SARS-CoV-2 target nucleic acids are NOT DETECTED.  The SARS-CoV-2 RNA is generally detectable in upper and lower respiratory specimens during the acute phase of infection. The lowest concentration of SARS-CoV-2 viral copies this assay can detect is 250 copies / mL. A negative result does not preclude SARS-CoV-2 infection and should not be used as the sole basis for treatment or other patient management decisions.  A negative result may occur with improper specimen collection / handling, submission of specimen other than nasopharyngeal swab, presence of viral mutation(s) within the areas targeted by this assay, and inadequate number of viral copies (<250 copies / mL). A negative result must be combined with clinical observations, patient history, and epidemiological information.  Fact Sheet for Patients:   StrictlyIdeas.no  Fact Sheet for Healthcare Providers: BankingDealers.co.za  This test is not yet approved or  cleared by the [place] FDA and has been authorized for detection and/or diagnosis of SARS-CoV-2 by FDA under an Emergency Use Authorization (EUA).  This EUA will remain in effect (meaning this  test can be used) for the duration of the COVID-19 declaration under Section 564(b)(1) of the Act, 21 U.S.C. section 360bbb-3(b)(1), unless the authorization is terminated or revoked sooner.  Performed at [facility], [address] [place], [place] [address]     Blood Alcohol level:  Lab Results  Component Value Date   [name] <10  01/22/2020   ETH <10 27/78/2423    Metabolic Disorder Labs: Lab Results  Component Value Date   HGBA1C 6.6 (H) 02/20/2020   MPG 142.72 02/20/2020   No results found for: PROLACTIN Lab Results  Component Value Date   CHOL 153 04/10/2015   TRIG 166 (H) 04/10/2015   HDL 50 04/10/2015   CHOLHDL 3.1 04/10/2015   VLDL 33 04/10/2015   LDLCALC 70 04/10/2015    Physical Findings: AIMS: Facial and Oral Movements Muscles of Facial Expression: None, normal Lips and Perioral Area: None, normal Jaw: None, normal Tongue: None, normal,Extremity Movements Upper (arms, wrists, hands, fingers): None, normal Lower (legs, knees, ankles, toes): None, normal, Trunk Movements Neck, shoulders, hips: None, normal, Overall Severity Severity of abnormal movements (highest score from questions above): None, normal Incapacitation due to abnormal movements: None, normal Patient's awareness of abnormal movements (rate only patient's report): No Awareness, Dental Status Current problems with teeth and/or dentures?: No Does patient usually wear dentures?: No  CIWA:    COWS:     Musculoskeletal: Strength & Muscle Tone: within normal limits Gait & Station: normal Patient leans: N/A  Psychiatric Specialty Exam: Physical Exam Vitals and nursing note reviewed.  Constitutional:      Appearance: He is well-developed.  HENT:     Head: Normocephalic and atraumatic.  Eyes:     Conjunctiva/sclera: Conjunctivae normal.     Pupils: Pupils are equal, round, and reactive to light.  Cardiovascular:     Heart sounds: Normal heart sounds.  Pulmonary:     Effort: Pulmonary effort is normal.  Abdominal:     Palpations: Abdomen is soft.  Musculoskeletal:        General: Normal range of motion.     Cervical back: Normal range of motion.  Skin:    General: Skin is warm and dry.  Neurological:     General: No focal deficit present.     Mental Status: He is alert.  Psychiatric:        Attention and  Perception: Attention normal.        Mood and Affect: Mood normal.        Speech: Speech normal.        Behavior: Behavior is cooperative.        Thought Content: Thought content normal.        Cognition and Memory: Cognition is impaired.        Judgment: Judgment is impulsive.     Review of Systems  Constitutional: Negative.   HENT: Negative.   Eyes: Negative.   Respiratory: Negative.   Cardiovascular: Negative.   Gastrointestinal: Negative.   Musculoskeletal: Negative.   Skin: Negative.   Neurological: Negative.   Psychiatric/Behavioral: Negative.     Blood pressure 134/78, pulse 66, temperature 97.9 F (36.6 C), temperature source Oral, resp. rate 17, height 5\' 8"  (1.727 m), weight 104.3 kg, SpO2 100 %.Body mass index is 34.96 kg/m.  General Appearance: Casual  Eye Contact:  Fair  Speech:  Clear and Coherent  Volume:  Normal  Mood:  Euthymic  Affect:  Constricted  Thought Process:  Coherent  Orientation:  Full (Time, Place,  and Person)  Thought Content:  Logical  Suicidal Thoughts:  No  Homicidal Thoughts:  No  Memory:  Immediate;   Fair Recent;   Fair Remote;   Fair  Judgement:  Fair  Insight:  Fair  Psychomotor Activity:  Normal  Concentration:  Concentration: Fair  Recall:  [employer] of Knowledge:  Fair  Language:  Fair  Akathisia:  No  Handed:  Right  AIMS (if indicated):     Assets:  Desire for Improvement Resilience Social Support  ADL's:  Intact  Cognition:  Impaired,  Mild  Sleep:  Number of Hours: 5.5     Treatment Plan Summary: Daily contact with patient to assess and evaluate symptoms and progress in treatment, Medication management and Plan [place] is doing quite well.  This extended hospitalization seems to have really helped him to stabilize.  We will hope that once he is discharged to a new environment he is able to cope with that adequately.  Supportive counseling.  Review of medicine and treatment plan.  No change to medication  today.  Alethia Berthold, MD 06/04/2020, 6:47 AM

## 2020-06-04 NOTE — Progress Notes (Signed)
Patient alert and oriented x 4, affect is blunted, his thoughts are organized and coherent, he appears less anxious, less intrusive, his speech is non pressured, he did not have an outburst  he was noted interacting appropriately with peers and staff. 15 minutes safety checks maintained closely will continue to monitor.

## 2020-06-04 NOTE — Plan of Care (Signed)
Pt in his room at the beginning of the shift. The patient out in the milieu complaining of walking around. The patient comes to the nursing station seeking help to get another patient's attention. The patient is encouraged to focus on activities that help him focus such as his drawing and coloring. The patient agree and requests for his coloring materials. The patient spent some tome coloring in the break room. The patient denies SI/HI/AVH. Q15 min safety checks maintained.  Problem: Education: Goal: Knowledge of [place] [facility] Education information/materials will improve Outcome: Progressing Goal: Emotional status will improve Outcome: Progressing Goal: Mental status will improve Outcome: Progressing Goal: Verbalization of understanding the information provided will improve Outcome: Progressing   Problem: Activity: Goal: Interest or engagement in activities will improve Outcome: Progressing Goal: Sleeping patterns will improve Outcome: Progressing   Problem: Coping: Goal: Ability to verbalize frustrations and anger appropriately will improve Outcome: Progressing Goal: Ability to demonstrate self-control will improve Outcome: Progressing   Problem: Health Behavior/Discharge Planning: Goal: Identification of resources available to assist in meeting health care needs will improve Outcome: Progressing Goal: Compliance with treatment plan for underlying cause of condition will improve Outcome: Progressing   Problem: Physical Regulation: Goal: Ability to maintain clinical measurements within normal limits will improve Outcome: Progressing   Problem: Safety: Goal: Periods of time without injury will increase Outcome: Progressing   Problem: Education: Goal: Ability to make informed decisions regarding treatment will improve Outcome: Progressing   Problem: Coping: Goal: Coping ability will improve Outcome: Progressing   Problem: Health Behavior/Discharge Planning: Goal:  Identification of resources available to assist in meeting health care needs will improve Outcome: Progressing   Problem: Medication: Goal: Compliance with prescribed medication regimen will improve Outcome: Progressing   Problem: Self-Concept: Goal: Ability to disclose and discuss suicidal ideas will improve Outcome: Progressing Goal: Will verbalize positive feelings about self Outcome: Progressing   Problem: Education: Goal: Knowledge of [place] [facility] Education information/materials will improve Outcome: Progressing Goal: Emotional status will improve Outcome: Progressing Goal: Mental status will improve Outcome: Progressing Goal: Verbalization of understanding the information provided will improve Outcome: Progressing   Problem: Activity: Goal: Interest or engagement in activities will improve Outcome: Progressing Goal: Sleeping patterns will improve Outcome: Progressing   Problem: Coping: Goal: Ability to verbalize frustrations and anger appropriately will improve Outcome: Progressing Goal: Ability to demonstrate self-control will improve Outcome: Progressing   Problem: Health Behavior/Discharge Planning: Goal: Identification of resources available to assist in meeting health care needs will improve Outcome: Progressing Goal: Compliance with treatment plan for underlying cause of condition will improve Outcome: Progressing   Problem: Physical Regulation: Goal: Ability to maintain clinical measurements within normal limits will improve Outcome: Progressing   Problem: Safety: Goal: Periods of time without injury will increase Outcome: Progressing   Problem: Education: Goal: Knowledge of [place] [facility] Education information/materials will improve Outcome: Progressing Goal: Emotional status will improve Outcome: Progressing Goal: Mental status will improve Outcome: Progressing Goal: Verbalization of understanding the information provided will  improve Outcome: Progressing   Problem: Activity: Goal: Interest or engagement in activities will improve Outcome: Progressing Goal: Sleeping patterns will improve Outcome: Progressing   Problem: Coping: Goal: Ability to verbalize frustrations and anger appropriately will improve Outcome: Progressing Goal: Ability to demonstrate self-control will improve Outcome: Progressing   Problem: Health Behavior/Discharge Planning: Goal: Identification of resources available to assist in meeting health care needs will improve Outcome: Progressing Goal: Compliance with treatment plan for underlying cause of condition will improve  Outcome: Progressing   Problem: Physical Regulation: Goal: Ability to maintain clinical measurements within normal limits will improve Outcome: Progressing   Problem: Safety: Goal: Periods of time without injury will increase Outcome: Progressing   Problem: Education: Goal: Knowledge of [place] [facility] Education information/materials will improve Outcome: Progressing Goal: Emotional status will improve Outcome: Progressing Goal: Mental status will improve Outcome: Progressing Goal: Verbalization of understanding the information provided will improve Outcome: Progressing   Problem: Activity: Goal: Interest or engagement in activities will improve Outcome: Progressing Goal: Sleeping patterns will improve Outcome: Progressing   Problem: Coping: Goal: Ability to verbalize frustrations and anger appropriately will improve Outcome: Progressing Goal: Ability to demonstrate self-control will improve Outcome: Progressing   Problem: Health Behavior/Discharge Planning: Goal: Identification of resources available to assist in meeting health care needs will improve Outcome: Progressing Goal: Compliance with treatment plan for underlying cause of condition will improve Outcome: Progressing   Problem: Physical Regulation: Goal: Ability to maintain clinical  measurements within normal limits will improve Outcome: Progressing   Problem: Safety: Goal: Periods of time without injury will increase Outcome: Progressing

## 2020-06-04 NOTE — Progress Notes (Signed)
D: Pt alert and oriented. Pt denies experiencing any anxiety/depression at this time. Pt denies experiencing any pain at this time. Pt denies experiencing any SI/HI, or AVH at this time.   A: Scheduled medications administered to pt, per MD orders. Support and encouragement provided. Frequent verbal contact made. Routine safety checks conducted q15 minutes.   R: No adverse drug reactions noted. Pt verbally contracts for safety at this time. Pt complaint with medications and treatment plan. Pt interacts well with others on the unit. Pt remains safe at this time. Will continue to monitor.

## 2020-06-05 MED ORDER — HALOPERIDOL 5 MG PO TABS: 5.0000 mg | ORAL_TABLET | ORAL | Status: DC | PRN

## 2020-06-05 MED ORDER — LORAZEPAM 2 MG PO TABS: 2.0000 mg | ORAL_TABLET | ORAL | Status: DC | PRN

## 2020-06-05 MED ORDER — DIPHENHYDRAMINE HCL 50 MG/ML IJ SOLN: 50.0000 mg | INTRAMUSCULAR | Status: DC | PRN

## 2020-06-05 MED ORDER — DIPHENHYDRAMINE HCL 25 MG PO CAPS
50.0000 mg | ORAL_CAPSULE | ORAL | Status: DC | PRN
  Administered 2020-06-07: 50 mg via ORAL
  Filled 2020-06-05: qty 2

## 2020-06-05 MED ORDER — LORAZEPAM 2 MG/ML IJ SOLN: 2.0000 mg | INTRAMUSCULAR | Status: DC | PRN

## 2020-06-05 MED ORDER — HALOPERIDOL LACTATE 5 MG/ML IJ SOLN: 5.0000 mg | INTRAMUSCULAR | Status: DC | PRN

## 2020-06-05 NOTE — Progress Notes (Signed)
Recreation Therapy Notes  Date: 06/05/2020   Time: 9:30 am   Location: Craft room     Behavioral response: N/A   Intervention Topic: Values  Discussion/Intervention: Patient did not attend group.   Clinical Observations/Feedback:  Patient did not attend group.   [name] LRT/CTRS        Alexander Beard 06/05/2020 10:42 AM

## 2020-06-05 NOTE — Progress Notes (Signed)
   06/05/20 1330  PPD Results  Does patient have an induration at the injection site? No  Induration(mm) 0 mm

## 2020-06-05 NOTE — Progress Notes (Signed)
[facility] MD Progress Note  06/05/2020 12:25 PM Alexander Beard  MRN:  [id number] Subjective: Patient fixates about his friendship with another male on the unit, and how he will miss her due to her discharge.  He speaks extensively about this.  Says that he will try to engage in drawing and other things that he enjoys to take his mind off of this.  Patient enjoys going outside, playing corn hole and listening to music.  Says he will likely talk to his brother today over the phone.  Still is looking forward to discharging on Monday.  No thoughts of suicide.Slept well last night.  Principal Problem: Schizoaffective disorder, bipolar type ([place]) Diagnosis: Principal Problem:   Schizoaffective disorder, bipolar type ([place]) Active Problems:   Active autistic disorder   Diabetes ([place])   Hypertension   Prostate hypertrophy   Intellectual disability   At risk for elopement  Total Time spent with patient: 30 minutes  Past Psychiatric History: Past history of longstanding disability intermittent behavior problems gradual improvement with some control with medicine  Past Medical History:  Past Medical History:  Diagnosis Date  . Anxiety   . Anxiety disorder due to known physiological condition    HOSPITALIZED 10/18  . Arthritis   . Autistic disorder, residual state   . COPD (chronic obstructive pulmonary disease) ([place])   . Depression   . Developmental disorder   . Diabetes mellitus without complication ([place])   . Dyslipidemia   . Esophageal reflux   . HOH (hard of hearing)    MILDLY  . Hypertension   . Obesity   . Overactive bladder   . Palpitations    ANXIETY  . Schizophrenia, schizoaffective ([place])   . Sleep apnea   . Tremors of nervous system    HANDS DUE TO MEDICATIONS  . Urinary incontinence     Past Surgical History:  Procedure Laterality Date  . CATARACT EXTRACTION W/PHACO Left 11/14/2017   Procedure: CATARACT EXTRACTION PHACO AND INTRAOCULAR LENS PLACEMENT (IOC);  Surgeon:  Birder Robson, MD;  Location: [facility] ORS;  Service: Ophthalmology;  Laterality: Left;  [place] 00:42 AP% 14.6 CDE 6.12 Fluid pack lot # [id number]  . COLONOSCOPY  01/28/2005  . COLONOSCOPY WITH PROPOFOL N/A 05/09/2016   Procedure: COLONOSCOPY WITH PROPOFOL;  Surgeon: Manya Silvas, MD;  Location: [facility] END[facility]COPY;  Service: Endoscopy;  Laterality: N/A;  . ESOPHAGOGASTRODUODENOSCOPY N/A 05/09/2016   Procedure: ESOPHAGOGASTRODUODENOSCOPY (EGD);  Surgeon: Manya Silvas, MD;  Location: [facility] ENDOSCOPY;  Service: Endoscopy;  Laterality: N/A;  . FRACTURE SURGERY     ORIF SHOULDER  . TONSILLECTOMY     Family History:  Family History  Problem Relation Age of Onset  . Hypertension Mother   . Stroke Father   . Heart Problems Father    Family Psychiatric  History: None reported Social History:  Social History   Substance and Sexual Activity  Alcohol Use No  . Alcohol/week: 0.0 standard drinks     Social History   Substance and Sexual Activity  Drug Use No    Social History   Socioeconomic History  . Marital status: Single    Spouse name: Not on file  . Number of children: Not on file  . Years of education: Not on file  . Highest education level: Not on file  Occupational History  . Not on file  Tobacco Use  . Smoking status: Never Smoker  . Smokeless tobacco: Never Used  Vaping Use  . Vaping Use: Never used  Substance  and Sexual Activity  . Alcohol use: No    Alcohol/week: 0.0 standard drinks  . Drug use: No  . Sexual activity: Never  Other Topics Concern  . Not on file  Social History Narrative   The patient never finished high school but did get his GED. 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 He is currently in disability and [name] is his legal guardian. He has been living in group homes for many years.      No pending legal charges   Social Determinants of Health   Financial Resource Strain:    . Difficulty of Paying Living Expenses: Not on file  Food Insecurity:   . Worried About Charity fundraiser in the Last Year: Not on file  . Ran Out of Food in the Last Year: Not on file  Transportation Needs:   . Lack of Transportation (Medical): Not on file  . Lack of Transportation (Non-Medical): Not on file  Physical Activity:   . Days of Exercise per Week: Not on file  . Minutes of Exercise per Session: Not on file  Stress:   . Feeling of Stress : Not on file  Social Connections:   . Frequency of Communication with Friends and Family: Not on file  . Frequency of Social Gatherings with Friends and Family: Not on file  . Attends Religious Services: Not on file  . Active Member of Clubs or Organizations: Not on file  . Attends Archivist Meetings: Not on file  . Marital Status: Not on file   Additional Social History:                         Sleep: Good  Appetite:  Good  Current Medications: Current Facility-Administered Medications  Medication Dose Route Frequency Provider Last Rate Last Admin  . acetaminophen (TYLENOL) tablet 650 mg  650 mg Oral Q6H PRN [name], MD   650 mg at 05/14/20 2122  . alum & mag hydroxide-simeth (MAALOX/MYLANTA) 200-200-20 MG/5ML suspension 30 mL  30 mL Oral Q4H PRN [name], MD      . aspirin EC tablet 81 mg  81 mg Oral Daily [name], [name], MD   81 mg at 06/05/20 0825  . clonazePAM (KLONOPIN) tablet 1 mg  1 mg Oral QID [name], [name], MD   1 mg at 06/05/20 0825  . divalproex (DEPAKOTE ER) 24 hr tablet 1,500 mg  1,500 mg Oral QHS [name], [name], MD   1,500 mg at 06/04/20 2109  . gabapentin (NEURONTIN) capsule 100 mg  100 mg Oral TID [name], [name], MD   100 mg at 06/05/20 0825  . hydrOXYzine (ATARAX/VISTARIL) tablet 10 mg  10 mg Oral TID PRN [name], MD   10 mg at 05/31/20 1420  . influenza vac split quadrivalent PF (FLUARIX) injection 0.5 mL  0.5 mL Intramuscular Tomorrow-1000 [name], [name], MD       . lamoTRIgine (LAMICTAL) tablet 150 mg  150 mg Oral QHS Salley Scarlet, MD   150 mg at 06/04/20 2108  . linagliptin (TRADJENTA) tablet 5 mg  5 mg Oral Daily [name], [name], MD   5 mg at 06/05/20 0825  . loperamide (IMODIUM) capsule 2 mg  2 mg Oral Q4H PRN [name], MD   2 mg at 04/10/20 0917  . loratadine (CLARITIN) tablet 10 mg  10  mg Oral Daily [name], [name], MD   10 mg at 06/05/20 0825  . LORazepam (ATIVAN) tablet 2 mg  2 mg Oral Q6H PRN Salley Scarlet, MD   2 mg at 06/04/20 1957  . magnesium hydroxide (MILK OF MAGNESIA) suspension 30 mL  30 mL Oral Daily PRN [name], MD   30 mL at 03/23/20 1344  . metFORMIN (GLUCOPHAGE) tablet 850 mg  850 mg Oral BID WC [name], [name], MD   850 mg at 06/05/20 0825  . metoprolol succinate (TOPROL-XL) 24 hr tablet 50 mg  50 mg Oral Daily [name], [name], MD   50 mg at 06/04/20 0827  . ondansetron (ZOFRAN) tablet 4 mg  4 mg Oral Q8H PRN [name], [name], MD   4 mg at 04/30/20 2035  . oxybutynin (DITROPAN) tablet 10 mg  10 mg Oral BID [name], [name], MD   10 mg at 06/05/20 0825  . pantoprazole (PROTONIX) EC tablet 40 mg  40 mg Oral Daily [name], [name], MD   40 mg at 06/05/20 0825  . prazosin (MINIPRESS) capsule 1 mg  1 mg Oral QHS [name], [name], MD   1 mg at 05/29/20 2029  . QUEtiapine (SEROQUEL) tablet 100 mg  100 mg Oral QID [name], [name], MD   100 mg at 06/05/20 0826  . QUEtiapine (SEROQUEL) tablet 400 mg  400 mg Oral QHS [name], NP   400 mg at 06/04/20 2109  . sertraline (ZOLOFT) tablet 50 mg  50 mg Oral BID [name], [name], MD   50 mg at 06/05/20 0825  . simethicone (MYLICON) chewable tablet 80 mg  80 mg Oral TID [name], [name], MD   80 mg at 06/05/20 0825  . simvastatin (ZOCOR) tablet 20 mg  20 mg Oral q1800 [name], [name], MD   20 mg at 06/04/20 1710  . tamsulosin (FLOMAX) capsule 0.8 mg  0.8 mg Oral QPC supper [name], [name], MD   0.8 mg at 06/04/20 1710  . tuberculin injection 5 Units  5 Units Intradermal  Once Salley Scarlet, MD   5 Units at 06/03/20 1432    Lab Results:  No results found for this or any previous visit (from the past 48 hour(s)).  Blood Alcohol level:  Lab Results  Component Value Date   ETH <10 01/22/2020   ETH <10 97/67/3419    Metabolic Disorder Labs: Lab Results  Component Value Date   HGBA1C 6.6 (H) 02/20/2020   MPG 142.72 02/20/2020   No results found for: PROLACTIN Lab Results  Component Value Date   CHOL 153 04/10/2015   TRIG 166 (H) 04/10/2015   HDL 50 04/10/2015   CHOLHDL 3.1 04/10/2015   VLDL 33 04/10/2015   LDLCALC 70 04/10/2015    Physical Findings: AIMS: Facial and Oral Movements Muscles of Facial Expression: None, normal Lips and Perioral Area: None, normal Jaw: None, normal Tongue: None, normal,Extremity Movements Upper (arms, wrists, hands, fingers): None, normal Lower (legs, knees, ankles, toes): None, normal, Trunk Movements Neck, shoulders, hips: None, normal, Overall Severity Severity of abnormal movements (highest score from questions above): None, normal Incapacitation due to abnormal movements: None, normal Patient's awareness of abnormal movements (rate only patient's report): No Awareness, Dental Status Current problems with teeth and/or dentures?: No Does patient usually wear dentures?: No  CIWA:    COWS:     Musculoskeletal: Strength & Muscle Tone: within normal limits Gait & Station: normal Patient leans: N/A  Psychiatric Specialty Exam: Physical Exam Vitals and nursing note reviewed.  Constitutional:      Appearance: He is well-developed.  HENT:     Head: Normocephalic and atraumatic.  Eyes:     Conjunctiva/sclera: Conjunctivae normal.     Pupils: Pupils are equal, round, and reactive to light.  Cardiovascular:     Heart sounds: Normal heart sounds.  Pulmonary:     Effort: Pulmonary effort is normal.  Abdominal:     Palpations: Abdomen is soft.  Musculoskeletal:        General: Normal range of motion.      Cervical back: Normal range of motion.  Skin:    General: Skin is warm and dry.  Neurological:     General: No focal deficit present.     Mental Status: He is alert.  Psychiatric:        Attention and Perception: Attention normal.        Mood and Affect: Mood normal.        Speech: Speech normal.        Behavior: Behavior is cooperative.        Thought Content: Thought content normal.        Cognition and Memory: Cognition is impaired.        Judgment: Judgment is impulsive.     Review of Systems  Constitutional: Negative.   HENT: Negative.   Eyes: Negative.   Respiratory: Negative.   Cardiovascular: Negative.   Gastrointestinal: Negative.   Musculoskeletal: Negative.   Skin: Negative.   Neurological: Negative.   Psychiatric/Behavioral: Negative.     Blood pressure 117/70, pulse (!) 58, temperature 97.7 F (36.5 C), temperature source Oral, resp. rate 17, height 5\' 8"  (1.727 m), weight 104.3 kg, SpO2 100 %.Body mass index is 34.96 kg/m.  General Appearance: Casual  Eye Contact:  Fair  Speech:  Clear and Coherent  Volume:  Normal  Mood:  "Sad that someone is leaving today."   Affect:  downcast  Thought Process:  Coherent  Orientation:  Full (Time, Place, and Person)  Thought Content:  Logical  Suicidal Thoughts:  No  Homicidal Thoughts:  No  Memory:  Immediate;   Fair Recent;   Fair Remote;   Fair  Judgement:  Fair  Insight:  Fair  Psychomotor Activity:  Normal  Concentration:  Concentration: Fair  Recall:  [employer] of Knowledge:  Fair  Language:  Fair  Akathisia:  No  Handed:  Right  AIMS (if indicated):     Assets:  Desire for Improvement Resilience Social Support  ADL's:  Intact  Cognition:  Impaired,  Mild  Sleep:  Number of Hours: 6.5     Treatment Plan Summary: Daily contact with patient to assess and evaluate symptoms and progress in treatment, Medication management and Plan Alexander Beard is doing quite well.  This extended hospitalization seems  to have really helped him to stabilize.  We will hope that once he is discharged to a new environment he is able to cope with that adequately.  Supportive counseling.  Review of medicine and treatment plan.  No change to medication today.  11/26 No changes  [name], MD 06/05/2020, 12:25 PM

## 2020-06-05 NOTE — Plan of Care (Signed)
Patient came to nurses station this morning states " I won't be able to attend any meetings today.I like to stay in my bed." Patient got up late for breakfast and took medicine.Denies SI,HI and AVH.Support and encouragement given.

## 2020-06-06 NOTE — Progress Notes (Signed)
D: Pt alert and oriented. Pt denies experiencing any anxiety/depression at this time .Pt reports experiencing/denies pain. Pt denies/reports experiencing any SI/HI, or AVH at this time.   A: Scheduled medications administered to pt, per MD orders. Support and encouragement provided. Frequent verbal contact made. Routine safety checks conducted q15 minutes.   R: No adverse drug reactions noted. Pt verbally contracts for safety at this time. Pt complaint with medications and treatment plan. Pt interacts well with others on the unit. Pt remains safe at this time. Will continue to monitor.

## 2020-06-06 NOTE — Plan of Care (Signed)
Problem: Education: Goal: Knowledge of [facility] Education information/materials will improve Outcome: Progressing Goal: Emotional status will improve Outcome: Progressing Goal: Mental status will improve Outcome: Progressing Goal: Verbalization of understanding the information provided will improve Outcome: Progressing   Problem: Activity: Goal: Interest or engagement in activities will improve Outcome: Progressing Goal: Sleeping patterns will improve Outcome: Progressing   Problem: Coping: Goal: Ability to verbalize frustrations and anger appropriately will improve Outcome: Progressing Goal: Ability to demonstrate self-control will improve Outcome: Progressing   Problem: Health Behavior/Discharge Planning: Goal: Identification of resources available to assist in meeting health care needs will improve Outcome: Progressing Goal: Compliance with treatment plan for underlying cause of condition will improve Outcome: Progressing   Problem: Physical Regulation: Goal: Ability to maintain clinical measurements within normal limits will improve Outcome: Progressing   Problem: Safety: Goal: Periods of time without injury will increase Outcome: Progressing   Problem: Education: Goal: Ability to make informed decisions regarding treatment will improve Outcome: Progressing   Problem: Coping: Goal: Coping ability will improve Outcome: Progressing   Problem: Health Behavior/Discharge Planning: Goal: Identification of resources available to assist in meeting health care needs will improve Outcome: Progressing   Problem: Medication: Goal: Compliance with prescribed medication regimen will improve Outcome: Progressing   Problem: Self-Concept: Goal: Ability to disclose and discuss suicidal ideas will improve Outcome: Progressing Goal: Will verbalize positive feelings about self Outcome: Progressing   Problem: Education: Goal: Knowledge of [facility]  Education information/materials will improve Outcome: Progressing Goal: Emotional status will improve Outcome: Progressing Goal: Mental status will improve Outcome: Progressing Goal: Verbalization of understanding the information provided will improve Outcome: Progressing   Problem: Activity: Goal: Interest or engagement in activities will improve Outcome: Progressing Goal: Sleeping patterns will improve Outcome: Progressing   Problem: Coping: Goal: Ability to verbalize frustrations and anger appropriately will improve Outcome: Progressing Goal: Ability to demonstrate self-control will improve Outcome: Progressing   Problem: Health Behavior/Discharge Planning: Goal: Identification of resources available to assist in meeting health care needs will improve Outcome: Progressing Goal: Compliance with treatment plan for underlying cause of condition will improve Outcome: Progressing   Problem: Physical Regulation: Goal: Ability to maintain clinical measurements within normal limits will improve Outcome: Progressing   Problem: Safety: Goal: Periods of time without injury will increase Outcome: Progressing   Problem: Education: Goal: Knowledge of [facility] Education information/materials will improve Outcome: Progressing Goal: Emotional status will improve Outcome: Progressing Goal: Mental status will improve Outcome: Progressing Goal: Verbalization of understanding the information provided will improve Outcome: Progressing   Problem: Activity: Goal: Interest or engagement in activities will improve Outcome: Progressing Goal: Sleeping patterns will improve Outcome: Progressing   Problem: Coping: Goal: Ability to verbalize frustrations and anger appropriately will improve Outcome: Progressing Goal: Ability to demonstrate self-control will improve Outcome: Progressing   Problem: Health Behavior/Discharge Planning: Goal: Identification of resources available  to assist in meeting health care needs will improve Outcome: Progressing Goal: Compliance with treatment plan for underlying cause of condition will improve Outcome: Progressing   Problem: Physical Regulation: Goal: Ability to maintain clinical measurements within normal limits will improve Outcome: Progressing   Problem: Safety: Goal: Periods of time without injury will increase Outcome: Progressing   Problem: Education: Goal: Knowledge of [facility] Education information/materials will improve Outcome: Progressing Goal: Emotional status will improve Outcome: Progressing Goal: Mental status will improve Outcome: Progressing Goal: Verbalization of understanding the information provided will improve Outcome: Progressing   Problem: Activity: Goal: Interest  or engagement in activities will improve Outcome: Progressing Goal: Sleeping patterns will improve Outcome: Progressing   Problem: Coping: Goal: Ability to verbalize frustrations and anger appropriately will improve Outcome: Progressing Goal: Ability to demonstrate self-control will improve Outcome: Progressing   Problem: Health Behavior/Discharge Planning: Goal: Identification of resources available to assist in meeting health care needs will improve Outcome: Progressing Goal: Compliance with treatment plan for underlying cause of condition will improve Outcome: Progressing   Problem: Physical Regulation: Goal: Ability to maintain clinical measurements within normal limits will improve Outcome: Progressing   Problem: Safety: Goal: Periods of time without injury will increase Outcome: Progressing

## 2020-06-06 NOTE — Progress Notes (Addendum)
[facility] MD Progress Note  06/06/2020 11:40 AM Alexander Beard  MRN:  [id number] Subjective:   11/27 Patient was upset yesterday evening about not being able to live at his brother's house.  Reflective listening employed.  He mentions that had been living with his brothers apparently for years but then he got sick.  States that his brother is ill and his brother believes that patient cannot take care of him.  Patient feels that he is capable.  Patient does acknowledge that placements have been applied to and now he has an opportunity to go to a new facility.  Says that he is still open to it. Denies any suicidal homicidal ideations.  No outburst today.  He was seen quietly drawing a picture in his room.  He indicates sleeping well last night but records indicate that he was up.  Eating well.  No new medical issues.  11/26 Patient fixates about his friendship with another male on the unit, and how he will miss her due to her discharge.  He speaks extensively about this.  Says that he will try to engage in drawing and other things that he enjoys to take his mind off of this.  Patient enjoys going outside, playing corn hole and listening to music.  Says he will likely talk to his brother today over the phone.  Still is looking forward to discharging on Monday.  No thoughts of suicide.Slept well last night.  Principal Problem: Schizoaffective disorder, bipolar type ([place]) Diagnosis: Principal Problem:   Schizoaffective disorder, bipolar type ([place]) Active Problems:   Active autistic disorder   Diabetes ([place])   Hypertension   Prostate hypertrophy   Intellectual disability   At risk for elopement  Total Time spent with patient: 30 minutes  Past Psychiatric History: Past history of longstanding disability intermittent behavior problems gradual improvement with some control with medicine  Past Medical History:  Past Medical History:  Diagnosis Date  . Anxiety   . Anxiety disorder due to known  physiological condition    HOSPITALIZED 10/18  . Arthritis   . Autistic disorder, residual state   . COPD (chronic obstructive pulmonary disease) ([place])   . Depression   . Developmental disorder   . Diabetes mellitus without complication ([place])   . Dyslipidemia   . Esophageal reflux   . HOH (hard of hearing)    MILDLY  . Hypertension   . Obesity   . Overactive bladder   . Palpitations    ANXIETY  . Schizophrenia, schizoaffective ([place])   . Sleep apnea   . Tremors of nervous system    HANDS DUE TO MEDICATIONS  . Urinary incontinence     Past Surgical History:  Procedure Laterality Date  . CATARACT EXTRACTION W/PHACO Left 11/14/2017   Procedure: CATARACT EXTRACTION PHACO AND INTRAOCULAR LENS PLACEMENT (IOC);  Surgeon: Birder Robson, MD;  Location: [facility] ORS;  Service: Ophthalmology;  Laterality: Left;  [place] 00:42 AP% 14.6 CDE 6.12 Fluid pack lot # [id number]  . COLONOSCOPY  01/28/2005  . COLONOSCOPY WITH PROPOFOL N/A 05/09/2016   Procedure: COLONOSCOPY WITH PROPOFOL;  Surgeon: Manya Silvas, MD;  [facility] END[facility]COPY;  Service: Endoscopy;  Laterality: N/A;  . ESOPHAGOGASTRODUODENOSCOPY N/A 05/09/2016   Procedure: ESOPHAGOGASTRODUODENOSCOPY (EGD);  Surgeon: Manya Silvas, MD;  Location: [facility] ENDOSCOPY;  Service: Endoscopy;  Laterality: N/A;  . FRACTURE SURGERY     ORIF SHOULDER  . TONSILLECTOMY     Family History:  Family History  Problem Relation Age of Onset  .  Hypertension Mother   . Stroke Father   . Heart Problems Father    Family Psychiatric  History: None reported Social History:  Social History   Substance and Sexual Activity  Alcohol Use No  . Alcohol/week: 0.0 standard drinks     Social History   Substance and Sexual Activity  Drug Use No    Social History   Socioeconomic History  . Marital status: Single    Spouse name: Not on file  . Number of children: Not on file  . Years of education: Not on file  . Highest education level: Not on file   Occupational History  . Not on file  Tobacco Use  . Smoking status: Never Smoker  . Smokeless tobacco: Never Used  Vaping Use  . Vaping Use: Never used  Substance and Sexual Activity  . Alcohol use: No    Alcohol/week: 0.0 standard drinks  . Drug use: No  . Sexual activity: Never  Other Topics Concern  . Not on file  Social History Narrative   The patient never finished high school but did get his GED. 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 He is currently in disability and [name] is his legal guardian. He has been living in group homes for many years.      No pending legal charges   Social Determinants of Health   Financial Resource Strain:   . Difficulty of Paying Living Expenses: Not on file  Food Insecurity:   . Worried About Charity fundraiser in the Last Year: Not on file  . Ran Out of Food in the Last Year: Not on file  Transportation Needs:   . Lack of Transportation (Medical): Not on file  . Lack of Transportation (Non-Medical): Not on file  Physical Activity:   . Days of Exercise per Week: Not on file  . Minutes of Exercise per Session: Not on file  Stress:   . Feeling of Stress : Not on file  Social Connections:   . Frequency of Communication with Friends and Family: Not on file  . Frequency of Social Gatherings with Friends and Family: Not on file  . Attends Religious Services: Not on file  . Active Member of Clubs or Organizations: Not on file  . Attends Archivist Meetings: Not on file  . Marital Status: Not on file   Additional Social History:                         Sleep: Good  Appetite:  Good  Current Medications: Current Facility-Administered Medications  Medication Dose Route Frequency Provider Last Rate Last Admin  . acetaminophen (TYLENOL) tablet 650 mg  650 mg Oral Q6H PRN [name], MD   650 mg at 05/14/20 2122  . alum & mag  hydroxide-simeth (MAALOX/MYLANTA) 200-200-20 MG/5ML suspension 30 mL  30 mL Oral Q4H PRN [name], MD      . aspirin EC tablet 81 mg  81 mg Oral Daily [name], [name], MD   81 mg at 06/06/20 0829  . clonazePAM (KLONOPIN) tablet 1 mg  1 mg Oral QID [name], [name], MD   1 mg at 06/06/20 5400  . diphenhydrAMINE (BENADRYL) capsule 50 mg  50 mg Oral Q4H PRN [name], MD       Or  . diphenhydrAMINE (BENADRYL) injection 50 mg  50 mg Intramuscular Q4H  PRN [name], MD      . divalproex (DEPAKOTE ER) 24 hr tablet 1,500 mg  1,500 mg Oral QHS [name], [name], MD   1,500 mg at 06/05/20 2110  . gabapentin (NEURONTIN) capsule 100 mg  100 mg Oral TID [name], [name], MD   100 mg at 06/06/20 5361  . haloperidol (HALDOL) tablet 5 mg  5 mg Oral Q4H PRN [name], MD       Or  . haloperidol lactate (HALDOL) injection 5 mg  5 mg Intramuscular Q4H PRN [name], MD      . hydrOXYzine (ATARAX/VISTARIL) tablet 10 mg  10 mg Oral TID PRN [name], MD   10 mg at 05/31/20 1420  . influenza vac split quadrivalent PF (FLUARIX) injection 0.5 mL  0.5 mL Intramuscular Tomorrow-1000 [name], [name], MD      . lamoTRIgine (LAMICTAL) tablet 150 mg  150 mg Oral QHS Salley Scarlet, MD   150 mg at 06/05/20 2111  . linagliptin (TRADJENTA) tablet 5 mg  5 mg Oral Daily [name], [name], MD   5 mg at 06/06/20 0832  . loperamide (IMODIUM) capsule 2 mg  2 mg Oral Q4H PRN [name], MD   2 mg at 04/10/20 0917  . loratadine (CLARITIN) tablet 10 mg  10 mg Oral Daily [name], [name], MD   10 mg at 06/06/20 4431  . LORazepam (ATIVAN) tablet 2 mg  2 mg Oral Q4H PRN [name], MD       Or  . LORazepam (ATIVAN) injection 2 mg  2 mg Intramuscular Q4H PRN [name], MD      . LORazepam (ATIVAN) tablet 2 mg  2 mg Oral Q6H PRN Salley Scarlet, MD   2 mg at 06/05/20 1846  . magnesium hydroxide (MILK OF MAGNESIA) suspension 30 mL  30 mL Oral Daily PRN [name], MD   30 mL at 03/23/20 1344  . metFORMIN  (GLUCOPHAGE) tablet 850 mg  850 mg Oral BID WC [name], [name], MD   850 mg at 06/06/20 0832  . metoprolol succinate (TOPROL-XL) 24 hr tablet 50 mg  50 mg Oral Daily [name], [name], MD   50 mg at 06/06/20 0850  . ondansetron (ZOFRAN) tablet 4 mg  4 mg Oral Q8H PRN [name], [name], MD   4 mg at 04/30/20 2035  . oxybutynin (DITROPAN) tablet 10 mg  10 mg Oral BID [name], [name], MD   10 mg at 06/06/20 5400  . pantoprazole (PROTONIX) EC tablet 40 mg  40 mg Oral Daily [name], [name], MD   40 mg at 06/06/20 0831  . prazosin (MINIPRESS) capsule 1 mg  1 mg Oral QHS [name], [name], MD   1 mg at 06/05/20 2127  . QUEtiapine (SEROQUEL) tablet 100 mg  100 mg Oral QID [name], [name], MD   100 mg at 06/06/20 0832  . QUEtiapine (SEROQUEL) tablet 400 mg  400 mg Oral QHS [name], NP   400 mg at 06/05/20 2110  . sertraline (ZOLOFT) tablet 50 mg  50 mg Oral BID [name], [name], MD   50 mg at 06/06/20 0831  . simethicone (MYLICON) chewable tablet 80 mg  80 mg Oral TID [name], [name], MD   80 mg at 06/06/20 0831  . simvastatin (ZOCOR) tablet 20 mg  20 mg Oral q1800 [name], [name], MD   20 mg at 06/05/20 1716  . tamsulosin (FLOMAX) capsule 0.8 mg  0.8 mg Oral QPC supper [name], [name], MD   0.8 mg at 06/05/20 1715    Lab Results:  No results found for this or any previous visit (from the past 48 hour(s)).  Blood Alcohol level:  Lab Results  Component Value Date   ETH <10 01/22/2020   ETH <10 76/22/6333    Metabolic Disorder Labs: Lab Results  Component Value Date   HGBA1C 6.6 (H) 02/20/2020   MPG 142.72 02/20/2020   No results found for: PROLACTIN Lab Results  Component Value Date   CHOL 153 04/10/2015   TRIG 166 (H) 04/10/2015   HDL 50 04/10/2015   CHOLHDL 3.1 04/10/2015   VLDL 33 04/10/2015   LDLCALC 70 04/10/2015    Physical Findings: AIMS: Facial and Oral Movements Muscles of Facial Expression: None, normal Lips and Perioral Area: None, normal Jaw: None, normal Tongue:  None, normal,Extremity Movements Upper (arms, wrists, hands, fingers): None, normal Lower (legs, knees, ankles, toes): None, normal, Trunk Movements Neck, shoulders, hips: None, normal, Overall Severity Severity of abnormal movements (highest score from questions above): None, normal Incapacitation due to abnormal movements: None, normal Patient's awareness of abnormal movements (rate only patient's report): No Awareness, Dental Status Current problems with teeth and/or dentures?: No Does patient usually wear dentures?: No  CIWA:    COWS:     Musculoskeletal: Strength & Muscle Tone: within normal limits Gait & Station: normal Patient leans: N/A  Psychiatric Specialty Exam: Physical Exam Vitals and nursing note reviewed.  Constitutional:      Appearance: He is well-developed.  HENT:     Head: Normocephalic and atraumatic.  Eyes:     Conjunctiva/sclera: Conjunctivae normal.     Pupils: Pupils are equal, round, and reactive to light.  Cardiovascular:     Heart sounds: Normal heart sounds.  Pulmonary:     Effort: Pulmonary effort is normal.  Abdominal:     Palpations: Abdomen is soft.  Musculoskeletal:        General: Normal range of motion.     Cervical back: Normal range of motion.  Skin:    General: Skin is warm and dry.  Neurological:     General: No focal deficit present.     Mental Status: He is alert.  Psychiatric:        Attention and Perception: Attention normal.        Mood and Affect: Mood normal.        Speech: Speech normal.        Behavior: Behavior is cooperative.        Thought Content: Thought content normal.        Cognition and Memory: Cognition is impaired.        Judgment: Judgment is impulsive.   Open speech box  Review of Systems  Constitutional: Negative.   HENT: Negative.   Eyes: Negative.   Respiratory: Negative.   Cardiovascular: Negative.   Gastrointestinal: Negative.   Musculoskeletal: Negative.   Skin: Negative.   Neurological:  Negative.   Psychiatric/Behavioral: Negative.     Blood pressure 120/80, pulse 67, temperature 97.7 F (36.5 C), temperature source Oral, resp. rate 17, height 5\' 8"  (1.727 m), weight 104.3 kg, SpO2 97 %.Body mass index is 34.96 kg/m.  General Appearance: Casual  Eye Contact:  Fair  Speech:  Clear and Coherent  Volume:  Normal  Mood:  "I'm alright."   Affect:  downcast  Thought Process:  Coherent open speech box  Orientation:  Full (Time, Place, and Person)  Thought Content:  Logical  Suicidal Thoughts:  No  Homicidal Thoughts:  No  Memory:  Immediate;   Fair Recent;   Fair Remote;   Fair  Judgement:  Fair  Insight:  Fair  Psychomotor Activity:  Normal  Concentration:  Concentration: Fair  Recall:  [employer] of Knowledge:  Fair  Language:  Fair  Akathisia:  No  Handed:  Right  AIMS (if indicated):     Assets:  Desire for Improvement Resilience Social Support  ADL's:  Intact  Cognition:  Impaired,  Mild  Sleep:  Number of Hours: 5.25     Treatment Plan Summary: Daily contact with patient to assess and evaluate symptoms and progress in treatment, Medication management and Plan Severin is doing quite well.  This extended hospitalization seems to have really helped him to stabilize.  We will hope that once he is discharged to a new environment he is able to cope with that adequately.  Supportive counseling.  Review of medicine and treatment plan.  No change to medication today.  11/26 No changes  11/27 No changes  [name], MD 06/06/2020, 11:40 AM

## 2020-06-06 NOTE — Progress Notes (Signed)
Patient had a temper tantrum at shift change, yelling "help me!, I can't live like this anymore!" He was demanding to go to live with his brother Alexander Beard, and when reminded that Alexander Beard had said no, he screamed "I can't accept that! I won't accept that!" Patient said he did not want to go to the group home because "there will be old people there". He received Ativan at 1845 but continued to be agitated. Called Dr. Daneil Dolin and got an order for Haldol 5, Benadryl 50 and Ativan 2 IM/PO but patient had calmed down on his own by the time the order was verified by pharmacy, so it was not given. After patient was calm he said that he would talk to his brother Alexander Beard about living at the group home part-time and staying with Alexander Beard only a couple days a week. Awake during the night, attempting to squat on the floor and use the bathroom on the floor and was redirected before he could do so.

## 2020-06-07 NOTE — [facility] Group Notes (Signed)
[facility] Group Notes: (Clinical Social Work)   06/07/2020      Type of Therapy:  Group Therapy   Participation Level:  Did Not Attend - was invited individually by Nurse/MHT and chose not to attend.   [name], [place] 06/07/2020  4:01 PM

## 2020-06-07 NOTE — Plan of Care (Signed)
Problem: Education: Goal: Knowledge of [place] [facility] Education information/materials will improve Outcome: Progressing Goal: Emotional status will improve Outcome: Progressing Goal: Mental status will improve Outcome: Progressing Goal: Verbalization of understanding the information provided will improve Outcome: Progressing   Problem: Activity: Goal: Interest or engagement in activities will improve Outcome: Progressing Goal: Sleeping patterns will improve Outcome: Progressing   Problem: Coping: Goal: Ability to verbalize frustrations and anger appropriately will improve Outcome: Progressing Goal: Ability to demonstrate self-control will improve Outcome: Progressing   Problem: Health Behavior/Discharge Planning: Goal: Identification of resources available to assist in meeting health care needs will improve Outcome: Progressing Goal: Compliance with treatment plan for underlying cause of condition will improve Outcome: Progressing   Problem: Physical Regulation: Goal: Ability to maintain clinical measurements within normal limits will improve Outcome: Progressing   Problem: Safety: Goal: Periods of time without injury will increase Outcome: Progressing   Problem: Education: Goal: Ability to make informed decisions regarding treatment will improve Outcome: Progressing   Problem: Coping: Goal: Coping ability will improve Outcome: Progressing   Problem: Health Behavior/Discharge Planning: Goal: Identification of resources available to assist in meeting health care needs will improve Outcome: Progressing   Problem: Medication: Goal: Compliance with prescribed medication regimen will improve Outcome: Progressing   Problem: Self-Concept: Goal: Ability to disclose and discuss suicidal ideas will improve Outcome: Progressing Goal: Will verbalize positive feelings about self Outcome: Progressing   Problem: Education: Goal: Knowledge of [place] [facility]  Education information/materials will improve Outcome: Progressing Goal: Emotional status will improve Outcome: Progressing Goal: Mental status will improve Outcome: Progressing Goal: Verbalization of understanding the information provided will improve Outcome: Progressing   Problem: Activity: Goal: Interest or engagement in activities will improve Outcome: Progressing Goal: Sleeping patterns will improve Outcome: Progressing   Problem: Coping: Goal: Ability to verbalize frustrations and anger appropriately will improve Outcome: Progressing Goal: Ability to demonstrate self-control will improve Outcome: Progressing   Problem: Health Behavior/Discharge Planning: Goal: Identification of resources available to assist in meeting health care needs will improve Outcome: Progressing Goal: Compliance with treatment plan for underlying cause of condition will improve Outcome: Progressing   Problem: Physical Regulation: Goal: Ability to maintain clinical measurements within normal limits will improve Outcome: Progressing   Problem: Safety: Goal: Periods of time without injury will increase Outcome: Progressing   Problem: Education: Goal: Knowledge of [place] [facility] Education information/materials will improve Outcome: Progressing Goal: Emotional status will improve Outcome: Progressing Goal: Mental status will improve Outcome: Progressing Goal: Verbalization of understanding the information provided will improve Outcome: Progressing   Problem: Activity: Goal: Interest or engagement in activities will improve Outcome: Progressing Goal: Sleeping patterns will improve Outcome: Progressing   Problem: Coping: Goal: Ability to verbalize frustrations and anger appropriately will improve Outcome: Progressing Goal: Ability to demonstrate self-control will improve Outcome: Progressing   Problem: Health Behavior/Discharge Planning: Goal: Identification of resources available  to assist in meeting health care needs will improve Outcome: Progressing Goal: Compliance with treatment plan for underlying cause of condition will improve Outcome: Progressing   Problem: Physical Regulation: Goal: Ability to maintain clinical measurements within normal limits will improve Outcome: Progressing   Problem: Safety: Goal: Periods of time without injury will increase Outcome: Progressing   Problem: Education: Goal: Knowledge of [place] [facility] Education information/materials will improve Outcome: Progressing Goal: Emotional status will improve Outcome: Progressing Goal: Mental status will improve Outcome: Progressing Goal: Verbalization of understanding the information provided will improve Outcome: Progressing   Problem: Activity: Goal: Interest  or engagement in activities will improve Outcome: Progressing Goal: Sleeping patterns will improve Outcome: Progressing   Problem: Coping: Goal: Ability to verbalize frustrations and anger appropriately will improve Outcome: Progressing Goal: Ability to demonstrate self-control will improve Outcome: Progressing   Problem: Health Behavior/Discharge Planning: Goal: Identification of resources available to assist in meeting health care needs will improve Outcome: Progressing Goal: Compliance with treatment plan for underlying cause of condition will improve Outcome: Progressing   Problem: Physical Regulation: Goal: Ability to maintain clinical measurements within normal limits will improve Outcome: Progressing   Problem: Safety: Goal: Periods of time without injury will increase Outcome: Progressing

## 2020-06-07 NOTE — Plan of Care (Signed)
D- Patient alert and oriented. Patient presents in a pleasant mood on assessment stating that he slept "well" last night and had no complaints to voice to this Probation officer. Patient denies SI, HI, AVH, and pain at this time. Patient also denies any signs/symptoms of depression and anxiety, stating that he is feeling "pretty good". Patient's goal for today is "artwork", in which he will "stay busy", in order to achieve his goal.  A- Scheduled medications administered to patient, per MD orders. Support and encouragement provided.  Routine safety checks conducted every 15 minutes.  Patient informed to notify staff with problems or concerns.  R- No adverse drug reactions noted. Patient contracts for safety at this time. Patient compliant with medications and treatment plan. Patient receptive, calm, and cooperative. Patient interacts well with others on the unit.  Patient remains safe at this time.  Problem: Education: Goal: Knowledge of [place] [facility] Education information/materials will improve Outcome: Progressing Goal: Emotional status will improve Outcome: Progressing Goal: Mental status will improve Outcome: Progressing Goal: Verbalization of understanding the information provided will improve Outcome: Progressing   Problem: Activity: Goal: Interest or engagement in activities will improve Outcome: Progressing Goal: Sleeping patterns will improve Outcome: Progressing   Problem: Coping: Goal: Ability to verbalize frustrations and anger appropriately will improve Outcome: Progressing Goal: Ability to demonstrate self-control will improve Outcome: Progressing   Problem: Health Behavior/Discharge Planning: Goal: Identification of resources available to assist in meeting health care needs will improve Outcome: Progressing Goal: Compliance with treatment plan for underlying cause of condition will improve Outcome: Progressing   Problem: Physical Regulation: Goal: Ability to maintain  clinical measurements within normal limits will improve Outcome: Progressing   Problem: Safety: Goal: Periods of time without injury will increase Outcome: Progressing   Problem: Education: Goal: Ability to make informed decisions regarding treatment will improve Outcome: Progressing   Problem: Coping: Goal: Coping ability will improve Outcome: Progressing   Problem: Health Behavior/Discharge Planning: Goal: Identification of resources available to assist in meeting health care needs will improve Outcome: Progressing   Problem: Medication: Goal: Compliance with prescribed medication regimen will improve Outcome: Progressing   Problem: Self-Concept: Goal: Ability to disclose and discuss suicidal ideas will improve Outcome: Progressing Goal: Will verbalize positive feelings about self Outcome: Progressing   Problem: Education: Goal: Knowledge of [place] [facility] Education information/materials will improve Outcome: Progressing Goal: Emotional status will improve Outcome: Progressing Goal: Mental status will improve Outcome: Progressing Goal: Verbalization of understanding the information provided will improve Outcome: Progressing   Problem: Activity: Goal: Interest or engagement in activities will improve Outcome: Progressing Goal: Sleeping patterns will improve Outcome: Progressing   Problem: Coping: Goal: Ability to verbalize frustrations and anger appropriately will improve Outcome: Progressing Goal: Ability to demonstrate self-control will improve Outcome: Progressing   Problem: Health Behavior/Discharge Planning: Goal: Identification of resources available to assist in meeting health care needs will improve Outcome: Progressing Goal: Compliance with treatment plan for underlying cause of condition will improve Outcome: Progressing   Problem: Physical Regulation: Goal: Ability to maintain clinical measurements within normal limits will improve Outcome:  Progressing   Problem: Safety: Goal: Periods of time without injury will increase Outcome: Progressing   Problem: Education: Goal: Knowledge of [place] [facility] Education information/materials will improve Outcome: Progressing Goal: Emotional status will improve Outcome: Progressing Goal: Mental status will improve Outcome: Progressing Goal: Verbalization of understanding the information provided will improve Outcome: Progressing   Problem: Activity: Goal: Interest or engagement in activities will improve Outcome: Progressing  Goal: Sleeping patterns will improve Outcome: Progressing   Problem: Coping: Goal: Ability to verbalize frustrations and anger appropriately will improve Outcome: Progressing Goal: Ability to demonstrate self-control will improve Outcome: Progressing   Problem: Health Behavior/Discharge Planning: Goal: Identification of resources available to assist in meeting health care needs will improve Outcome: Progressing Goal: Compliance with treatment plan for underlying cause of condition will improve Outcome: Progressing   Problem: Physical Regulation: Goal: Ability to maintain clinical measurements within normal limits will improve Outcome: Progressing   Problem: Safety: Goal: Periods of time without injury will increase Outcome: Progressing   Problem: Education: Goal: Knowledge of [place] [facility] Education information/materials will improve Outcome: Progressing Goal: Emotional status will improve Outcome: Progressing Goal: Mental status will improve Outcome: Progressing Goal: Verbalization of understanding the information provided will improve Outcome: Progressing   Problem: Activity: Goal: Interest or engagement in activities will improve Outcome: Progressing Goal: Sleeping patterns will improve Outcome: Progressing   Problem: Coping: Goal: Ability to verbalize frustrations and anger appropriately will improve Outcome:  Progressing Goal: Ability to demonstrate self-control will improve Outcome: Progressing   Problem: Health Behavior/Discharge Planning: Goal: Identification of resources available to assist in meeting health care needs will improve Outcome: Progressing Goal: Compliance with treatment plan for underlying cause of condition will improve Outcome: Progressing   Problem: Physical Regulation: Goal: Ability to maintain clinical measurements within normal limits will improve Outcome: Progressing   Problem: Safety: Goal: Periods of time without injury will increase Outcome: Progressing

## 2020-06-07 NOTE — Progress Notes (Signed)
[facility] MD Progress Note  06/07/2020 10:54 AM Alexander Beard  MRN:  [id number] Subjective:  11/28 Patient is seen in his room.  Tells me that he is just "resting up."  Patient appears to be more at ease and relaxed.  He agrees.  He is open to going to the group home, and looks forward to engaging in activities there.  Says that he wants to be able to do other things that he enjoys his such as seeing yet events.  Shares that he was part of a advisory committee in the past and is writing them a letter.  Does not reveal what he is writing.  Says that he is looking forward to eventually interacting with his brother as well.  Finally he recalls the times when they used to go to movies together.  He worries about his brother's health.  He did sleep well last night.  No outbursts or irritability.  No psychosis.  No new medical issues.  He is taking his medications as prescribed.  11/27 Patient was upset yesterday evening about not being able to live at his brother's house.  Reflective listening employed.  He mentions that had been living with his brothers apparently for years but then he got sick.  States that his brother is ill and his brother believes that patient cannot take care of him.  Patient feels that he is capable.  Patient does acknowledge that placements have been applied to and now he has an opportunity to go to a new facility.  Says that he is still open to it. Denies any suicidal homicidal ideations.  No outburst today.  He was seen quietly drawing a picture in his room.  He indicates sleeping well last night but records indicate that he was up.  Eating well.  No new medical issues.  11/26 Patient fixates about his friendship with another male on the unit, and how he will miss her due to her discharge.  He speaks extensively about this.  Says that he will try to engage in drawing and other things that he enjoys to take his mind off of this.  Patient enjoys going outside, playing corn hole and  listening to music.  Says he will likely talk to his brother today over the phone.  Still is looking forward to discharging on Monday.  No thoughts of suicide.Slept well last night.  Principal Problem: Schizoaffective disorder, bipolar type ([place]) Diagnosis: Principal Problem:   Schizoaffective disorder, bipolar type ([place]) Active Problems:   Active autistic disorder   Diabetes ([place])   Hypertension   Prostate hypertrophy   Intellectual disability   At risk for elopement  Total Time spent with patient: 30 minutes  Past Psychiatric History: Past history of longstanding disability intermittent behavior problems gradual improvement with some control with medicine  Past Medical History:  Past Medical History:  Diagnosis Date  . Anxiety   . Anxiety disorder due to known physiological condition    HOSPITALIZED 10/18  . Arthritis   . Autistic disorder, residual state   . COPD (chronic obstructive pulmonary disease) ([place])   . Depression   . Developmental disorder   . Diabetes mellitus without complication ([place])   . Dyslipidemia   . Esophageal reflux   . HOH (hard of hearing)    MILDLY  . Hypertension   . Obesity   . Overactive bladder   . Palpitations    ANXIETY  . Schizophrenia, schizoaffective ([place])   . Sleep apnea   . Tremors  of nervous system    HANDS DUE TO MEDICATIONS  . Urinary incontinence     Past Surgical History:  Procedure Laterality Date  . CATARACT EXTRACTION W/PHACO Left 11/14/2017   Procedure: CATARACT EXTRACTION PHACO AND INTRAOCULAR LENS PLACEMENT (IOC);  Surgeon: Birder Robson, MD;  Location: [facility] ORS;  Service: Ophthalmology;  Laterality: Left;  [place] 00:42 AP% 14.6 CDE 6.12 Fluid pack lot # [id number]  . COLONOSCOPY  01/28/2005  . COLONOSCOPY WITH PROPOFOL N/A 05/09/2016   Procedure: COLONOSCOPY WITH PROPOFOL;  Surgeon: Manya Silvas, MD;  Location: [facility] ENDOSCOPY;  Service: Endoscopy;  Laterality: N/A;  . ESOPHAGOGASTRODUODENOSCOPY N/A 05/09/2016    Procedure: ESOPHAGOGASTRODUODENOSCOPY (EGD);  Surgeon: Manya Silvas, MD;  Location: [facility] ENDOSCOPY;  Service: Endoscopy;  Laterality: N/A;  . FRACTURE SURGERY     ORIF SHOULDER  . TONSILLECTOMY     Family History:  Family History  Problem Relation Age of Onset  . Hypertension Mother   . Stroke Father   . Heart Problems Father    Family Psychiatric  History: None reported Social History:  Social History   Substance and Sexual Activity  Alcohol Use No  . Alcohol/week: 0.0 standard drinks     Social History   Substance and Sexual Activity  Drug Use No    Social History   Socioeconomic History  . Marital status: Single    Spouse name: Not on file  . Number of children: Not on file  . Years of education: Not on file  . Highest education level: Not on file  Occupational History  . Not on file  Tobacco Use  . Smoking status: Never Smoker  . Smokeless tobacco: Never Used  Vaping Use  . Vaping Use: Never used  Substance and Sexual Activity  . Alcohol use: No    Alcohol/week: 0.0 standard drinks  . Drug use: No  . Sexual activity: Never  Other Topics Concern  . Not on file  Social History Narrative   The patient never finished high school but did get his GED. 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 He is currently in disability and [name] is his legal guardian. He has been living in group homes for many years.      No pending legal charges   Social Determinants of Health   Financial Resource Strain:   . Difficulty of Paying Living Expenses: Not on file  Food Insecurity:   . Worried About Charity fundraiser in the Last Year: Not on file  . Ran Out of Food in the Last Year: Not on file  Transportation Needs:   . Lack of Transportation (Medical): Not on file  . Lack of Transportation (Non-Medical): Not on file  Physical Activity:   . Days of Exercise per Week: Not on file  . Minutes of  Exercise per Session: Not on file  Stress:   . Feeling of Stress : Not on file  Social Connections:   . Frequency of Communication with Friends and Family: Not on file  . Frequency of Social Gatherings with Friends and Family: Not on file  . Attends Religious Services: Not on file  . Active Member of Clubs or Organizations: Not on file  . Attends Archivist Meetings: Not on file  . Marital Status: Not on file   Additional Social History:  Sleep: Good  Appetite:  Good  Current Medications: Current Facility-Administered Medications  Medication Dose Route Frequency Provider Last Rate Last Admin  . acetaminophen (TYLENOL) tablet 650 mg  650 mg Oral Q6H PRN [name], MD   650 mg at 05/14/20 2122  . alum & mag hydroxide-simeth (MAALOX/MYLANTA) 200-200-20 MG/5ML suspension 30 mL  30 mL Oral Q4H PRN [name], MD      . aspirin EC tablet 81 mg  81 mg Oral Daily [name], [name], MD   81 mg at 06/07/20 0826  . clonazePAM (KLONOPIN) tablet 1 mg  1 mg Oral QID [name], [name], MD   1 mg at 06/07/20 0827  . diphenhydrAMINE (BENADRYL) capsule 50 mg  50 mg Oral Q4H PRN [name], MD       Or  . diphenhydrAMINE (BENADRYL) injection 50 mg  50 mg Intramuscular Q4H PRN [name], MD      . divalproex (DEPAKOTE ER) 24 hr tablet 1,500 mg  1,500 mg Oral QHS [name], [name], MD   1,500 mg at 06/06/20 2112  . gabapentin (NEURONTIN) capsule 100 mg  100 mg Oral TID [name], [name], MD   100 mg at 06/07/20 0825  . haloperidol (HALDOL) tablet 5 mg  5 mg Oral Q4H PRN [name], MD       Or  . haloperidol lactate (HALDOL) injection 5 mg  5 mg Intramuscular Q4H PRN [name], MD      . hydrOXYzine (ATARAX/VISTARIL) tablet 10 mg  10 mg Oral TID PRN [name], MD   10 mg at 05/31/20 1420  . influenza vac split quadrivalent PF (FLUARIX) injection 0.5 mL  0.5 mL Intramuscular Tomorrow-1000 [name], [name], MD      . lamoTRIgine (LAMICTAL)  tablet 150 mg  150 mg Oral QHS Salley Scarlet, MD   150 mg at 06/06/20 2112  . linagliptin (TRADJENTA) tablet 5 mg  5 mg Oral Daily [name], [name], MD   5 mg at 06/07/20 0827  . loperamide (IMODIUM) capsule 2 mg  2 mg Oral Q4H PRN [name], MD   2 mg at 04/10/20 0917  . loratadine (CLARITIN) tablet 10 mg  10 mg Oral Daily [name], [name], MD   10 mg at 06/07/20 0827  . LORazepam (ATIVAN) tablet 2 mg  2 mg Oral Q4H PRN [name], MD       Or  . LORazepam (ATIVAN) injection 2 mg  2 mg Intramuscular Q4H PRN [name], MD      . LORazepam (ATIVAN) tablet 2 mg  2 mg Oral Q6H PRN Salley Scarlet, MD   2 mg at 06/05/20 1846  . magnesium hydroxide (MILK OF MAGNESIA) suspension 30 mL  30 mL Oral Daily PRN [name], MD   30 mL at 03/23/20 1344  . metFORMIN (GLUCOPHAGE) tablet 850 mg  850 mg Oral BID WC [name], [name], MD   850 mg at 06/07/20 0826  . metoprolol succinate (TOPROL-XL) 24 hr tablet 50 mg  50 mg Oral Daily [name], [name], MD   50 mg at 06/07/20 0826  . ondansetron (ZOFRAN) tablet 4 mg  4 mg Oral Q8H PRN [name], [name], MD   4 mg at 04/30/20 2035  . oxybutynin (DITROPAN) tablet 10 mg  10 mg Oral BID [name], [name], MD   10 mg at 06/07/20 0826  . pantoprazole (PROTONIX) EC tablet 40 mg  40 mg Oral Daily [name], [name], MD  40 mg at 06/07/20 0826  . prazosin (MINIPRESS) capsule 1 mg  1 mg Oral QHS [name], [name], MD   1 mg at 06/06/20 2113  . QUEtiapine (SEROQUEL) tablet 100 mg  100 mg Oral QID [name], [name], MD   100 mg at 06/07/20 0827  . QUEtiapine (SEROQUEL) tablet 400 mg  400 mg Oral QHS [name], NP   400 mg at 06/06/20 2112  . sertraline (ZOLOFT) tablet 50 mg  50 mg Oral BID [name], [name], MD   50 mg at 06/07/20 0826  . simethicone (MYLICON) chewable tablet 80 mg  80 mg Oral TID [name], [name], MD   80 mg at 06/07/20 0826  . simvastatin (ZOCOR) tablet 20 mg  20 mg Oral q1800 [name], [name], MD   20 mg at 06/06/20 1704  . tamsulosin (FLOMAX)  capsule 0.8 mg  0.8 mg Oral QPC supper [name], [name], MD   0.8 mg at 06/06/20 1703    Lab Results:  No results found for this or any previous visit (from the past 48 hour(s)).  Blood Alcohol level:  Lab Results  Component Value Date   ETH <10 01/22/2020   ETH <10 95/63/8756    Metabolic Disorder Labs: Lab Results  Component Value Date   HGBA1C 6.6 (H) 02/20/2020   MPG 142.72 02/20/2020   No results found for: PROLACTIN Lab Results  Component Value Date   CHOL 153 04/10/2015   TRIG 166 (H) 04/10/2015   HDL 50 04/10/2015   CHOLHDL 3.1 04/10/2015   VLDL 33 04/10/2015   LDLCALC 70 04/10/2015    Physical Findings: AIMS: Facial and Oral Movements Muscles of Facial Expression: None, normal Lips and Perioral Area: None, normal Jaw: None, normal Tongue: None, normal,Extremity Movements Upper (arms, wrists, hands, fingers): None, normal Lower (legs, knees, ankles, toes): None, normal, Trunk Movements Neck, shoulders, hips: None, normal, Overall Severity Severity of abnormal movements (highest score from questions above): None, normal Incapacitation due to abnormal movements: None, normal Patient's awareness of abnormal movements (rate only patient's report): No Awareness, Dental Status Current problems with teeth and/or dentures?: No Does patient usually wear dentures?: No  CIWA:    COWS:     Musculoskeletal: Strength & Muscle Tone: within normal limits Gait & Station: normal Patient leans: N/A  Psychiatric Specialty Exam: Review of systems: See HPI, other review of systems negative.  Review of Systems  Constitutional: Negative.   HENT: Negative.   Eyes: Negative.   Respiratory: Negative.   Cardiovascular: Negative.   Gastrointestinal: Negative.   Musculoskeletal: Negative.   Skin: Negative.   Neurological: Negative.   Psychiatric/Behavioral: Negative.     Blood pressure 108/70, pulse 81, temperature 98.3 F (36.8 C), temperature source Oral, resp. rate 18,  height 5\' 8"  (1.727 m), weight 104.3 kg, SpO2 100 %.Body mass index is 34.96 kg/m.  General Appearance: Casual  Eye Contact:  Fair  Speech:  Clear and Coherent  Volume:  Normal  Mood:  "Good."   Affect:  Bright  Thought Process:  Coherent open speech box  Orientation:  Full (Time, Place, and Person)  Thought Content:  Logical  Suicidal Thoughts:  No  Homicidal Thoughts:  No  Memory:  Immediate;   Fair Recent;   Fair Remote;   Fair  Judgement:  good  Insight:  Fair  Psychomotor Activity:  Normal  Concentration:  Concentration: Fair  Recall:  [employer] of Knowledge:  Fair  Language:  Fair  Akathisia:  No  Handed:  Right  AIMS (if indicated):     Assets:  Desire for Improvement Resilience Social Support  ADL's:  Intact  Cognition:  Impaired,  Mild  Sleep:  Number of Hours: 6.45     Treatment Plan Summary: Daily contact with patient to assess and evaluate symptoms and progress in treatment, Medication management and Plan Link is doing quite well.  This extended hospitalization seems to have really helped him to stabilize.  We will hope that once he is discharged to a new environment he is able to cope with that adequately.  Supportive counseling.  Review of medicine and treatment plan.  No change to medication today.  11/26 No changes  11/27 No changes  11/28 No changes [name], MD 06/07/2020, 10:54 AM

## 2020-06-07 NOTE — Progress Notes (Signed)
Patient has been more quiet and subdued today, staying mostly in his room working on his art work or in the [employer] and [employer]. Hygiene is poor. Patient is disheveled. Hands shake when he takes his medications and he spills his water. Not voicing any more resistance to his discharge plan today

## 2020-06-08 MED ORDER — ASPIRIN 81 MG PO TBEC: 81.0000 mg | DELAYED_RELEASE_TABLET | Freq: Every day | ORAL | 1 refills | Status: AC

## 2020-06-08 MED ORDER — SERTRALINE HCL 50 MG PO TABS: 50.0000 mg | ORAL_TABLET | Freq: Two times a day (BID) | ORAL | 1 refills | Status: DC

## 2020-06-08 MED ORDER — TAMSULOSIN HCL 0.4 MG PO CAPS: 0.8000 mg | ORAL_CAPSULE | Freq: Every day | ORAL | 1 refills | Status: DC

## 2020-06-08 MED ORDER — LORATADINE 10 MG PO TABS
10.0000 mg | ORAL_TABLET | Freq: Every day | ORAL | 1 refills | Status: AC
Start: 2020-06-08 — End: ?

## 2020-06-08 MED ORDER — SIMVASTATIN 20 MG PO TABS: 20.0000 mg | ORAL_TABLET | Freq: Every day | ORAL | 1 refills | Status: DC

## 2020-06-08 MED ORDER — GABAPENTIN 100 MG PO CAPS
100.0000 mg | ORAL_CAPSULE | Freq: Three times a day (TID) | ORAL | 1 refills | Status: DC
Start: 2020-06-08 — End: 2020-08-18

## 2020-06-08 MED ORDER — SIMETHICONE 80 MG PO CHEW: 80.0000 mg | CHEWABLE_TABLET | Freq: Three times a day (TID) | ORAL | 1 refills | Status: AC

## 2020-06-08 MED ORDER — METFORMIN HCL 850 MG PO TABS: 850.0000 mg | ORAL_TABLET | Freq: Two times a day (BID) | ORAL | 1 refills | Status: AC

## 2020-06-08 MED ORDER — HYDROXYZINE HCL 10 MG PO TABS: 10.0000 mg | ORAL_TABLET | Freq: Three times a day (TID) | ORAL | 1 refills | Status: AC | PRN

## 2020-06-08 MED ORDER — OXYBUTYNIN CHLORIDE 5 MG PO TABS
10.0000 mg | ORAL_TABLET | Freq: Two times a day (BID) | ORAL | 1 refills | Status: AC
Start: 2020-06-08 — End: ?

## 2020-06-08 MED ORDER — LAMOTRIGINE 150 MG PO TABS: 150.0000 mg | ORAL_TABLET | Freq: Every day | ORAL | 1 refills | Status: AC

## 2020-06-08 MED ORDER — QUETIAPINE FUMARATE 400 MG PO TABS: 400.0000 mg | ORAL_TABLET | Freq: Every day | ORAL | 1 refills | Status: DC

## 2020-06-08 MED ORDER — CLONAZEPAM 1 MG PO TABS: 1.0000 mg | ORAL_TABLET | Freq: Four times a day (QID) | ORAL | 1 refills | Status: DC

## 2020-06-08 MED ORDER — DIVALPROEX SODIUM ER 500 MG PO TB24
1500.0000 mg | ORAL_TABLET | Freq: Every day | ORAL | 1 refills | Status: DC
Start: 2020-06-08 — End: 2020-09-09

## 2020-06-08 MED ORDER — LINAGLIPTIN 5 MG PO TABS: 5.0000 mg | ORAL_TABLET | Freq: Every day | ORAL | 1 refills | Status: AC

## 2020-06-08 MED ORDER — LORAZEPAM 2 MG PO TABS: 2.0000 mg | ORAL_TABLET | Freq: Four times a day (QID) | ORAL | 1 refills | Status: AC | PRN

## 2020-06-08 MED ORDER — PRAZOSIN HCL 1 MG PO CAPS: 1.0000 mg | ORAL_CAPSULE | Freq: Every day | ORAL | 1 refills | Status: DC

## 2020-06-08 MED ORDER — QUETIAPINE FUMARATE 100 MG PO TABS
100.0000 mg | ORAL_TABLET | Freq: Four times a day (QID) | ORAL | 1 refills | Status: DC
Start: 2020-06-08 — End: 2020-06-30

## 2020-06-08 MED ORDER — PANTOPRAZOLE SODIUM 40 MG PO TBEC: 40.0000 mg | DELAYED_RELEASE_TABLET | Freq: Every day | ORAL | 1 refills | Status: AC

## 2020-06-08 MED ORDER — METOPROLOL SUCCINATE ER 50 MG PO TB24: 50.0000 mg | ORAL_TABLET | Freq: Every day | ORAL | 1 refills | Status: DC

## 2020-06-08 NOTE — Discharge Summary (Signed)
Physician Discharge Summary Note  Patient:  Alexander Beard is an 64 y.o., male MRN:  [id number] DOB:  1956/02/15 Patient phone:  [phone] (home)  Patient address:   [place] [address],  Total Time spent with patient: 30 minutes  Date of Admission:  02/19/2020 Date of Discharge: 06/08/2020  Reason for Admission:  Symptoms of anxiety, confusion, agitation, and mood instability  Principal Problem: Schizoaffective disorder, bipolar type [facility]) Discharge Diagnoses: Principal Problem:   Schizoaffective disorder, bipolar type ([place]) Active Problems:   Active autistic disorder   Diabetes ([place])   Hypertension   Prostate hypertrophy   Intellectual disability   At risk for elopement  HPI per [name], "History of Present Illness: Patient seen and chart reviewed.  Patient well known from many previous encounters.  Patient is a 64 year old man who presented to our emergency room with symptoms of anxiety confusion and agitation.  Patient's agitation behavior problems and mood instability have been getting worse for the last few months.  He has had multiple episodes of running away from his group home running to police stations screaming running out into traffic.  Sleep has been worse than usual.  Patient says that he cannot stop himself thinking about negative things and feeling frightened all the time.  He denies specific hallucinations.  He denies acute suicidal or homicidal ideation.  He has been compliant with outpatient medication.  No alcohol or drugs involved.  Major life stresses have occurred.  He has now received a 30-day notice from his group home and has no place to stay.  His brother who is his closest relative has recently been diagnosed with cancer and is very incapacitated."  Past Psychiatric History: Patient has a lifelong history of impairment.  Carries a diagnosis of schizoaffective disorder.  Had a past history of multiple  hospitalizations but has been able to stay out of the hospital for years with outpatient treatment and support through a group home.  Past history of suicidal statements and impulsive behavior but no clear suicide attempts.  No history of violence to others.  Past Medical History:  Past Medical History:  Diagnosis Date  . Anxiety   . Anxiety disorder due to known physiological condition    HOSPITALIZED 10/18  . Arthritis   . Autistic disorder, residual state   . COPD (chronic obstructive pulmonary disease) ([place])   . Depression   . Developmental disorder   . Diabetes mellitus without complication ([place])   . Dyslipidemia   . Esophageal reflux   . HOH (hard of hearing)    MILDLY  . Hypertension   . Obesity   . Overactive bladder   . Palpitations    ANXIETY  . Schizophrenia, schizoaffective ([place])   . Sleep apnea   . Tremors of nervous system    HANDS DUE TO MEDICATIONS  . Urinary incontinence     Past Surgical History:  Procedure Laterality Date  . CATARACT EXTRACTION W/PHACO Left 11/14/2017   Procedure: CATARACT EXTRACTION PHACO AND INTRAOCULAR LENS PLACEMENT (IOC);  Surgeon: Birder Robson, MD;  Location: [facility] ORS;  Service: Ophthalmology;  Laterality: Left;  [place] 00:42 AP% 14.6 CDE 6.12 Fluid pack lot # [id number]  . COLONOSCOPY  01/28/2005  . COLONOSCOPY WITH PROPOFOL N/A 05/09/2016   Procedure: COLONOSCOPY WITH PROPOFOL;  Surgeon: Manya Silvas, MD;  Location: [facility] ENDOSCOPY;  Service: Endoscopy;  Laterality: N/A;  . ESOPHAGOGASTRODUODENOSCOPY N/A 05/09/2016   Procedure: ESOPHAGOGASTRODUODENOSCOPY (EGD);  Surgeon: Herbie Baltimore  Federico Flake, MD;  Location: [facility] ENDOSCOPY;  Service: Endoscopy;  Laterality: N/A;  . FRACTURE SURGERY     ORIF SHOULDER  . TONSILLECTOMY     Family History:  Family History  Problem Relation Age of Onset  . Hypertension Mother   . Stroke Father   . Heart Problems Father    Family Psychiatric  History: Denies Social History:  Social History    Substance and Sexual Activity  Alcohol Use No  . Alcohol/week: 0.0 standard drinks     Social History   Substance and Sexual Activity  Drug Use No    Social History   Socioeconomic History  . Marital status: Single    Spouse name: Not on file  . Number of children: Not on file  . Years of education: Not on file  . Highest education level: Not on file  Occupational History  . Not on file  Tobacco Use  . Smoking status: Never Smoker  . Smokeless tobacco: Never Used  Vaping Use  . Vaping Use: Never used  Substance and Sexual Activity  . Alcohol use: No    Alcohol/week: 0.0 standard drinks  . Drug use: No  . Sexual activity: Never  Other Topics Concern  . Not on file  Social History Narrative   The patient never finished high school but did get his GED. 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 He is currently in disability and [name] is his legal guardian. He has been living in group homes for many years.      No pending legal charges   Social Determinants of Health   Financial Resource Strain:   . Difficulty of Paying Living Expenses: Not on file  Food Insecurity:   . Worried About Charity fundraiser in the Last Year: Not on file  . Ran Out of Food in the Last Year: Not on file  Transportation Needs:   . Lack of Transportation (Medical): Not on file  . Lack of Transportation (Non-Medical): Not on file  Physical Activity:   . Days of Exercise per Week: Not on file  . Minutes of Exercise per Session: Not on file  Stress:   . Feeling of Stress : Not on file  Social Connections:   . Frequency of Communication with Friends and Family: Not on file  . Frequency of Social Gatherings with Friends and Family: Not on file  . Attends Religious Services: Not on file  . Active Member of Clubs or Organizations: Not on file  . Attends Archivist Meetings: Not on file  . Marital Status: Not on  file    Hospital Course:  Patient admitted to the hospital for severe agitation, confusion, and behavioral disturbance. During admission Latuda was discontinued, Lamictal increased to 150 mg daily, Klonopin 1 mg four times a day was added, and all other medications were continued. He was also able to participate in groups and gain coping skills such as deep breathing techniques, drawing, and coloring. His hospital stay was prolonged due to inability to find proper placement. He did not require restraints or seclusion during his stay, but did require PRN medications for anxiety.  Physical Findings: AIMS: Facial and Oral Movements Muscles of Facial Expression: None, normal Lips and Perioral Area: None, normal Jaw: None, normal Tongue: None, normal,Extremity Movements Upper (arms, wrists, hands, fingers): None, normal Lower (legs, knees, ankles, toes): None, normal, Trunk Movements Neck, shoulders,  hips: None, normal, Overall Severity Severity of abnormal movements (highest score from questions above): None, normal Incapacitation due to abnormal movements: None, normal Patient's awareness of abnormal movements (rate only patient's report): No Awareness, Dental Status Current problems with teeth and/or dentures?: No Does patient usually wear dentures?: No  CIWA:    COWS:     Musculoskeletal: Strength & Muscle Tone: within normal limits Gait & Station: normal Patient leans: N/A  Psychiatric Specialty Exam: Physical Exam Vitals and nursing note reviewed.  Constitutional:      Appearance: Normal appearance.  HENT:     Head: Normocephalic and atraumatic.     Right Ear: External ear normal.     Left Ear: External ear normal.     Nose: Nose normal.     Mouth/Throat:     Mouth: Mucous membranes are moist.     Pharynx: Oropharynx is clear.  Eyes:     Extraocular Movements: Extraocular movements intact.     Conjunctiva/sclera: Conjunctivae normal.     Pupils: Pupils are equal, round,  and reactive to light.  Cardiovascular:     Rate and Rhythm: Normal rate.     Pulses: Normal pulses.  Pulmonary:     Effort: Pulmonary effort is normal.     Breath sounds: Normal breath sounds.  Abdominal:     General: Abdomen is flat.     Palpations: Abdomen is soft.  Musculoskeletal:        General: No swelling. Normal range of motion.     Cervical back: Normal range of motion and neck supple.  Skin:    General: Skin is warm and dry.  Neurological:     General: No focal deficit present.     Mental Status: He is alert and oriented to person, place, and time.  Psychiatric:        Mood and Affect: Mood normal.        Behavior: Behavior normal.        Thought Content: Thought content normal.        Judgment: Judgment normal.     Review of Systems  Constitutional: Negative for activity change and fatigue.  HENT: Negative for rhinorrhea and sore throat.   Eyes: Negative for photophobia and visual disturbance.  Respiratory: Negative for cough and shortness of breath.   Cardiovascular: Negative for chest pain and palpitations.  Gastrointestinal: Negative for constipation, diarrhea, nausea and vomiting.  Endocrine: Negative for cold intolerance and heat intolerance.  Genitourinary: Negative for difficulty urinating and dysuria.  Musculoskeletal: Negative for arthralgias and back pain.  Skin: Negative for rash and wound.  Allergic/Immunologic: Negative for environmental allergies and immunocompromised state.  Neurological: Negative for dizziness and headaches.  Hematological: Negative for adenopathy. Does not bruise/bleed easily.  Psychiatric/Behavioral: Negative for agitation, behavioral problems, hallucinations and suicidal ideas.   Blood pressure (!) 143/86, pulse 62, temperature 98.9 F (37.2 C), temperature source Oral, resp. rate 18, height 5\' 8"  (1.727 m), weight 104.3 kg, SpO2 94 %.Body mass index is 34.96 kg/m.  General Appearance: Casual  Eye Contact::  Good  Speech:   Clear and Coherent409  Volume:  Normal  Mood:  Euthymic  Affect:  Congruent  Thought Process:  Coherent  Orientation:  Full (Time, Place, and Person)  Thought Content:  Logical  Suicidal Thoughts:  No  Homicidal Thoughts:  No  Memory:  Immediate;   Fair Recent;   Fair Remote;   Fair  Judgement:  Fair  Insight:  Fair  Psychomotor Activity:  Normal  Concentration:  Fair  Recall:  [place]: Fair  Akathisia:  Negative  Handed:  Right  AIMS (if indicated):     Assets:  Communication Skills Desire for Improvement Financial Resources/Insurance Housing Leisure Time Resilience Social Support Talents/Skills  Sleep:  Number of Hours: 3  Cognition: WNL  ADL's:  Intact           Has this patient used any form of tobacco in the last 30 days? (Cigarettes, Smokeless Tobacco, Cigars, and/or Pipes) No  Blood Alcohol level:  Lab Results  Component Value Date   ETH <10 01/22/2020   ETH <10 34/19/6222    Metabolic Disorder Labs:  Lab Results  Component Value Date   HGBA1C 6.6 (H) 02/20/2020   MPG 142.72 02/20/2020   No results found for: PROLACTIN Lab Results  Component Value Date   CHOL 153 04/10/2015   TRIG 166 (H) 04/10/2015   HDL 50 04/10/2015   CHOLHDL 3.1 04/10/2015   VLDL 33 04/10/2015   LDLCALC 70 04/10/2015    See Psychiatric Specialty Exam and Suicide Risk Assessment completed by Attending Physician prior to discharge.  Discharge destination:  Other:  Group home  Is patient on multiple antipsychotic therapies at discharge:  No   Has Patient had three or more failed trials of antipsychotic monotherapy by history:  No  Recommended Plan for Multiple Antipsychotic Therapies: NA  Discharge Instructions    Diet Carb Modified   Complete by: As directed    Increase activity slowly   Complete by: As directed      Allergies as of 06/08/2020      Reactions   Other Other (See Comments)   MYACINS   Penicillins Nausea And  Vomiting   Cortizone-10 [hydrocortisone] Rash      Medication List    STOP taking these medications   Calcium 600-D 600-400 MG-UNIT Tabs Generic drug: Calcium Carbonate-Vitamin D3   docusate sodium 100 MG capsule Commonly known as: COLACE   furosemide 20 MG tablet Commonly known as: LASIX   HEMORRHOIDAL RE   Levemir FlexTouch 100 UNIT/ML FlexPen Generic drug: insulin detemir   lurasidone 40 MG Tabs tablet Commonly known as: Latuda   NON FORMULARY   polyethylene glycol 17 g packet Commonly known as: MIRALAX / GLYCOLAX   sitaGLIPtin 100 MG tablet Commonly known as: JANUVIA   solifenacin 5 MG tablet Commonly known as: VESICARE   Vitamin D-1000 Max St 25 MCG (1000 UT) tablet Generic drug: Cholecalciferol     TAKE these medications     Indication  acetaminophen 650 MG CR tablet Commonly known as: TYLENOL Take 650 mg by mouth every 12 (twelve) hours as needed for pain.  Indication: Pain   aspirin 81 MG EC tablet Take 1 tablet (81 mg total) by mouth daily. Swallow whole. Start taking on: June 09, 2020 What changed: additional instructions  Indication: Stable Angina Pectoris   clonazePAM 1 MG tablet Commonly known as: KLONOPIN Take 1 tablet (1 mg total) by mouth 4 (four) times daily.  Indication: Feeling Anxious   divalproex 500 MG 24 hr tablet Commonly known as: DEPAKOTE ER Take 3 tablets (1,500 mg total) by mouth at bedtime.  Indication: MIXED BIPOLAR AFFECTIVE DISORDER   gabapentin 100 MG capsule Commonly known as: NEURONTIN Take 1 capsule (100 mg total) by mouth 3 (three) times daily.  Indication: anxiety   hydrOXYzine 10 MG tablet Commonly known as: ATARAX/VISTARIL Take 1 tablet (10 mg total) by mouth 3 (three) times  daily as needed for anxiety.  Indication: Feeling Anxious   lamoTRIgine 150 MG tablet Commonly known as: LAMICTAL Take 1 tablet (150 mg total) by mouth at bedtime. What changed:   medication strength  how much to take   Indication: Manic-Depression, Depressive Phase of Manic-Depression   linagliptin 5 MG Tabs tablet Commonly known as: TRADJENTA Take 1 tablet (5 mg total) by mouth daily. Start taking on: June 09, 2020  Indication: Type 2 Diabetes   loratadine 10 MG tablet Commonly known as: Claritin Take 1 tablet (10 mg total) by mouth daily.  Indication: Perennial Allergic Rhinitis   LORazepam 2 MG tablet Commonly known as: ATIVAN Take 1 tablet (2 mg total) by mouth every 6 (six) hours as needed (severe anxiety). What changed:   medication strength  how much to take  when to take this  reasons to take this  Indication: Feeling Anxious   metFORMIN 850 MG tablet Commonly known as: GLUCOPHAGE Take 1 tablet (850 mg total) by mouth 2 (two) times daily with a meal.  Indication: Type 2 Diabetes   metoprolol succinate 50 MG 24 hr tablet Commonly known as: TOPROL-XL Take 1 tablet (50 mg total) by mouth daily. Start taking on: June 09, 2020  Indication: High Blood Pressure Disorder   oxybutynin 5 MG tablet Commonly known as: DITROPAN Take 2 tablets (10 mg total) by mouth 2 (two) times daily. What changed:   how much to take  when to take this  Indication: Overactive Bladder, Urinary Incontinence   pantoprazole 40 MG tablet Commonly known as: PROTONIX Take 1 tablet (40 mg total) by mouth daily.  Indication: Gastroesophageal Reflux Disease   prazosin 1 MG capsule Commonly known as: MINIPRESS Take 1 capsule (1 mg total) by mouth at bedtime.  Indication: High Blood Pressure Disorder, Disturbed Sleep   QUEtiapine 400 MG tablet Commonly known as: SEROQUEL Take 1 tablet (400 mg total) by mouth at bedtime. What changed: Another medication with the same name was changed. Make sure you understand how and when to take each.  Indication: Major Depressive Disorder   QUEtiapine 100 MG tablet Commonly known as: SEROQUEL Take 1 tablet (100 mg total) by mouth 4 (four) times  daily. What changed:   how much to take  how to take this  when to take this  additional instructions  Indication: Major Depressive Disorder   sertraline 50 MG tablet Commonly known as: ZOLOFT Take 1 tablet (50 mg total) by mouth 2 (two) times daily. What changed:   medication strength  how much to take  when to take this  Another medication with the same name was removed. Continue taking this medication, and follow the directions you see here.  Indication: Major Depressive Disorder   simethicone 80 MG chewable tablet Commonly known as: MYLICON Chew 1 tablet (80 mg total) by mouth 3 (three) times daily. What changed:   how much to take  when to take this  reasons to take this  Indication: Gas   simvastatin 20 MG tablet Commonly known as: ZOCOR Take 1 tablet (20 mg total) by mouth daily at 6 PM. What changed: when to take this  Indication: High Amount of Fats in the Blood   tamsulosin 0.4 MG Caps capsule Commonly known as: FLOMAX Take 2 capsules (0.8 mg total) by mouth daily after supper. What changed:   how much to take  when to take this  Indication: Dysfunction of the Urinary Bladder       Follow-up Information  [place] Follow up on 06/10/2020.   Why: Appointment is scheduled for 06/10/2020 at 12:30PM.  Thanks!  Contact information: [place] [address] [phone]               Follow-up recommendations:  Activity:  as tolerated Diet:  low-carb, diabetic diet  Comments:  90-day scripts sent to [facility] in [place], [place]  Signed: Salley Scarlet, MD 06/08/2020, 9:29 AM

## 2020-06-08 NOTE — Progress Notes (Signed)
Recreation Therapy Notes  INPATIENT RECREATION TR PLAN  Patient Details Name: Alexander Beard MRN: [id number] DOB: 1955-09-24 Today's Date: 06/08/2020  Rec Therapy Plan Is patient appropriate for Therapeutic Recreation?: Yes Treatment times per week: at least 3 Estimated Length of Stay: 5-7 days TR Treatment/Interventions: Group participation (Comment)  Discharge Criteria Pt will be discharged from therapy if:: Discharged Treatment plan/goals/alternatives discussed and agreed upon by:: Patient/family  Discharge Summary Short term goals set: Patient will identify 3 triggers to anxiety within 5 recreation therapy group sessions Short term goals met: Complete Progress toward goals comments: Groups attended Which groups?: Other (Comment), Self-esteem, Anger management, Wellness, AAA/T, Stress management, Communication, Coping skills, Social skills, Leisure education, Goal setting (Happiness, Emotions, [place], Relaxation, Creative Expressions) Reason goals not met: N/A Therapeutic equipment acquired: N/A Reason patient discharged from therapy: Discharge from hospital Pt/family agrees with progress & goals achieved: Yes Date patient discharged from therapy: 06/08/20   [place] 06/08/2020, 1:55 PM

## 2020-06-08 NOTE — Progress Notes (Signed)
Patient denies SI, HI and AVH. Ready for discharge

## 2020-06-08 NOTE — Progress Notes (Signed)
Patient denies SI/HI, denies A/V hallucinations. Patient verbalizes understanding of discharge instructions, follow up care and prescriptions. Patient given all belongings from [facility] locker. Patient escorted out by staff, transported by group home staff.

## 2020-06-08 NOTE — Plan of Care (Signed)
Problem: Education: Goal: Knowledge of [facility] Education information/materials will improve Outcome: Adequate for Discharge Goal: Emotional status will improve Outcome: Adequate for Discharge Goal: Mental status will improve Outcome: Adequate for Discharge Goal: Verbalization of understanding the information provided will improve Outcome: Adequate for Discharge   Problem: Activity: Goal: Interest or engagement in activities will improve Outcome: Adequate for Discharge Goal: Sleeping patterns will improve Outcome: Adequate for Discharge   Problem: Coping: Goal: Ability to verbalize frustrations and anger appropriately will improve Outcome: Adequate for Discharge Goal: Ability to demonstrate self-control will improve Outcome: Adequate for Discharge   Problem: Health Behavior/Discharge Planning: Goal: Identification of resources available to assist in meeting health care needs will improve Outcome: Adequate for Discharge Goal: Compliance with treatment plan for underlying cause of condition will improve Outcome: Adequate for Discharge   Problem: Physical Regulation: Goal: Ability to maintain clinical measurements within normal limits will improve Outcome: Adequate for Discharge   Problem: Safety: Goal: Periods of time without injury will increase Outcome: Adequate for Discharge   Problem: Education: Goal: Ability to make informed decisions regarding treatment will improve Outcome: Adequate for Discharge   Problem: Coping: Goal: Coping ability will improve Outcome: Adequate for Discharge   Problem: Health Behavior/Discharge Planning: Goal: Identification of resources available to assist in meeting health care needs will improve Outcome: Adequate for Discharge   Problem: Medication: Goal: Compliance with prescribed medication regimen will improve Outcome: Adequate for Discharge   Problem: Self-Concept: Goal: Ability to disclose and discuss suicidal ideas  will improve Outcome: Adequate for Discharge Goal: Will verbalize positive feelings about self Outcome: Adequate for Discharge   Problem: Education: Goal: Knowledge of [place] Education information/materials will improve Outcome: Adequate for Discharge Goal: Emotional status will improve Outcome: Adequate for Discharge Goal: Mental status will improve Outcome: Adequate for Discharge Goal: Verbalization of understanding the information provided will improve Outcome: Adequate for Discharge   Problem: Activity: Goal: Interest or engagement in activities will improve Outcome: Adequate for Discharge Goal: Sleeping patterns will improve Outcome: Adequate for Discharge   Problem: Coping: Goal: Ability to verbalize frustrations and anger appropriately will improve Outcome: Adequate for Discharge Goal: Ability to demonstrate self-control will improve Outcome: Adequate for Discharge   Problem: Health Behavior/Discharge Planning: Goal: Identification of resources available to assist in meeting health care needs will improve Outcome: Adequate for Discharge Goal: Compliance with treatment plan for underlying cause of condition will improve Outcome: Adequate for Discharge   Problem: Physical Regulation: Goal: Ability to maintain clinical measurements within normal limits will improve Outcome: Adequate for Discharge   Problem: Safety: Goal: Periods of time without injury will increase Outcome: Adequate for Discharge   Problem: Education: Goal: Knowledge of [facility] [facility] Education information/materials will improve Outcome: Adequate for Discharge Goal: Emotional status will improve Outcome: Adequate for Discharge Goal: Mental status will improve Outcome: Adequate for Discharge Goal: Verbalization of understanding the information provided will improve Outcome: Adequate for Discharge   Problem: Activity: Goal: Interest or engagement in activities will  improve Outcome: Adequate for Discharge Goal: Sleeping patterns will improve Outcome: Adequate for Discharge   Problem: Coping: Goal: Ability to verbalize frustrations and anger appropriately will improve Outcome: Adequate for Discharge Goal: Ability to demonstrate self-control will improve Outcome: Adequate for Discharge   Problem: Health Behavior/Discharge Planning: Goal: Identification of resources available to assist in meeting health care needs will improve Outcome: Adequate for Discharge Goal: Compliance with treatment plan for underlying cause of condition will improve Outcome: Adequate for  Discharge   Problem: Physical Regulation: Goal: Ability to maintain clinical measurements within normal limits will improve Outcome: Adequate for Discharge   Problem: Safety: Goal: Periods of time without injury will increase Outcome: Adequate for Discharge   Problem: Education: Goal: Knowledge of [place] [facility] Education information/materials will improve Outcome: Adequate for Discharge Goal: Emotional status will improve Outcome: Adequate for Discharge Goal: Mental status will improve Outcome: Adequate for Discharge Goal: Verbalization of understanding the information provided will improve Outcome: Adequate for Discharge   Problem: Activity: Goal: Interest or engagement in activities will improve Outcome: Adequate for Discharge Goal: Sleeping patterns will improve Outcome: Adequate for Discharge   Problem: Coping: Goal: Ability to verbalize frustrations and anger appropriately will improve Outcome: Adequate for Discharge Goal: Ability to demonstrate self-control will improve Outcome: Adequate for Discharge   Problem: Health Behavior/Discharge Planning: Goal: Identification of resources available to assist in meeting health care needs will improve Outcome: Adequate for Discharge Goal: Compliance with treatment plan for underlying cause of condition will  improve Outcome: Adequate for Discharge   Problem: Physical Regulation: Goal: Ability to maintain clinical measurements within normal limits will improve Outcome: Adequate for Discharge   Problem: Safety: Goal: Periods of time without injury will increase Outcome: Adequate for Discharge

## 2020-06-08 NOTE — Progress Notes (Signed)
  [facility] Adult Case Management Discharge Plan :  Will you be returning to the same living situation after discharge:  No.  Patient going to a new group home.  At discharge, do you have transportation home?: Yes,  Group home will provide transportaiton. Do you have the ability to pay for your medications: Yes,  Medicaid/Medicare  Release of information consent forms completed and in the chart;  Patient's signature needed at discharge.  Patient to Follow up at:  Follow-up Information    [facility] Follow up on 06/10/2020.   Why: Appointment is scheduled for 06/10/2020 at 12:30PM.  Thanks!  Contact information: [place] [id number] [phone]               Next level of care provider has access to [place] and Suicide Prevention discussed: Yes,  SPE completed with patient and brother.     Has patient been referred to the Quitline?: N/A patient is not a smoker  Patient has been referred for addiction treatment: [place], LCSW 06/08/2020, 9:01 AM

## 2020-06-08 NOTE — [facility] Suicide Risk Assessment (Signed)
[facility] Discharge Suicide Risk Assessment   Principal Problem: Schizoaffective disorder, bipolar type [facility]) Discharge Diagnoses: Principal Problem:   Schizoaffective disorder, bipolar type ([place]) Active Problems:   Active autistic disorder   Diabetes ([place])   Hypertension   Prostate hypertrophy   Intellectual disability   At risk for elopement   Total Time spent with patient: 30 minutes  Musculoskeletal: Strength & Muscle Tone: within normal limits Gait & Station: normal Patient leans: N/A  Psychiatric Specialty Exam: Review of Systems  Constitutional: Negative for activity change and fatigue.  HENT: Negative for rhinorrhea and sore throat.   Eyes: Negative for photophobia and visual disturbance.  Respiratory: Negative for cough and shortness of breath.   Cardiovascular: Negative for chest pain and palpitations.  Gastrointestinal: Negative for constipation, diarrhea, nausea and vomiting.  Endocrine: Negative for cold intolerance and heat intolerance.  Genitourinary: Negative for difficulty urinating and dysuria.  Musculoskeletal: Negative for arthralgias and back pain.  Skin: Negative for rash and wound.  Allergic/Immunologic: Negative for environmental allergies and immunocompromised state.  Neurological: Negative for dizziness and headaches.  Hematological: Negative for adenopathy. Does not bruise/bleed easily.  Psychiatric/Behavioral: Negative for agitation, behavioral problems, hallucinations and suicidal ideas.    Blood pressure (!) 143/86, pulse 62, temperature 98.9 F (37.2 C), temperature source Oral, resp. rate 18, height 5\' 8"  (1.727 m), weight 104.3 kg, SpO2 94 %.Body mass index is 34.96 kg/m.  General Appearance: Casual  Eye Contact::  Good  Speech:  Clear and Coherent409  Volume:  Normal  Mood:  Euthymic  Affect:  Congruent  Thought Process:  Coherent  Orientation:  Full (Time, Place, and Person)  Thought Content:  Logical  Suicidal Thoughts:  No  Homicidal  Thoughts:  No  Memory:  Immediate;   Fair Recent;   Fair Remote;   Fair  Judgement:  Fair  Insight:  Fair  Psychomotor Activity:  Normal  Concentration:  Fair  Recall:  [employer] of Knowledge:Fair  Language: Fair  Akathisia:  Negative  Handed:  Right  AIMS (if indicated):     Assets:  Communication Skills Desire for Improvement Financial Resources/Insurance Housing Leisure Time Resilience Social Support Talents/Skills  Sleep:  Number of Hours: 3  Cognition: WNL  ADL's:  Intact   Mental Status Per Nursing Assessment::   On Admission:  Suicidal ideation indicated by patient  Demographic Factors:  Male and Caucasian  Loss Factors: NA  Historical Factors: Impulsivity  Risk Reduction Factors:   Sense of responsibility to family, Religious beliefs about death, Living with another person, especially a relative, Positive social support, Positive therapeutic relationship and Positive coping skills or problem solving skills  Continued Clinical Symptoms:  Severe Anxiety and/or Agitation Previous Psychiatric Diagnoses and Treatments  Cognitive Features That Contribute To Risk:  None    Suicide Risk:  Minimal: No identifiable suicidal ideation.  Patients presenting with no risk factors but with morbid ruminations; may be classified as minimal risk based on the severity of the depressive symptoms    Plan Of Care/Follow-up recommendations:  Activity:  as tolerated Diet:  low-carb, diabetic diet  Salley Scarlet, MD 06/08/2020, 8:55 AM

## 2020-06-08 NOTE — Tx Team (Signed)
Interdisciplinary Treatment and Diagnostic Plan Update  06/08/2020 Time of Session: 8:30 AM  GOERGE MOHR MRN: [id number]  Principal Diagnosis: Schizoaffective disorder, bipolar type ([place])  Secondary Diagnoses: Principal Problem:   Schizoaffective disorder, bipolar type ([place]) Active Problems:   Active autistic disorder   Diabetes ([place])   Hypertension   Prostate hypertrophy   Intellectual disability   At risk for elopement   Current Medications:  Current Facility-Administered Medications  Medication Dose Route Frequency Provider Last Rate Last Admin  . acetaminophen (TYLENOL) tablet 650 mg  650 mg Oral Q6H PRN [name], MD   650 mg at 05/14/20 2122  . alum & mag hydroxide-simeth (MAALOX/MYLANTA) 200-200-20 MG/5ML suspension 30 mL  30 mL Oral Q4H PRN [name], MD      . aspirin EC tablet 81 mg  81 mg Oral Daily [name], [name], MD   81 mg at 06/08/20 3419  . clonazePAM (KLONOPIN) tablet 1 mg  1 mg Oral QID [name], [name], MD   1 mg at 06/08/20 6222  . diphenhydrAMINE (BENADRYL) capsule 50 mg  50 mg Oral Q4H PRN [name], MD   50 mg at 06/07/20 2118   Or  . diphenhydrAMINE (BENADRYL) injection 50 mg  50 mg Intramuscular Q4H PRN [name], MD      . divalproex (DEPAKOTE ER) 24 hr tablet 1,500 mg  1,500 mg Oral QHS [name], [name], MD   1,500 mg at 06/07/20 2118  . gabapentin (NEURONTIN) capsule 100 mg  100 mg Oral TID [name], [name], MD   100 mg at 06/08/20 0831  . haloperidol (HALDOL) tablet 5 mg  5 mg Oral Q4H PRN [name], MD       Or  . haloperidol lactate (HALDOL) injection 5 mg  5 mg Intramuscular Q4H PRN [name], MD      . hydrOXYzine (ATARAX/VISTARIL) tablet 10 mg  10 mg Oral TID PRN [name], MD   10 mg at 05/31/20 1420  . influenza vac split quadrivalent PF (FLUARIX) injection 0.5 mL  0.5 mL Intramuscular Tomorrow-1000 [name], [name], MD      . lamoTRIgine (LAMICTAL) tablet 150 mg  150 mg Oral QHS Salley Scarlet, MD   150 mg at  06/07/20 2118  . linagliptin (TRADJENTA) tablet 5 mg  5 mg Oral Daily [name], [name], MD   5 mg at 06/08/20 9798  . loperamide (IMODIUM) capsule 2 mg  2 mg Oral Q4H PRN [name], MD   2 mg at 04/10/20 0917  . loratadine (CLARITIN) tablet 10 mg  10 mg Oral Daily [name], [name], MD   10 mg at 06/08/20 0831  . LORazepam (ATIVAN) tablet 2 mg  2 mg Oral Q4H PRN [name], MD       Or  . LORazepam (ATIVAN) injection 2 mg  2 mg Intramuscular Q4H PRN [name], MD      . LORazepam (ATIVAN) tablet 2 mg  2 mg Oral Q6H PRN Salley Scarlet, MD   2 mg at 06/05/20 1846  . magnesium hydroxide (MILK OF MAGNESIA) suspension 30 mL  30 mL Oral Daily PRN [name], MD   30 mL at 03/23/20 1344  . metFORMIN (GLUCOPHAGE) tablet 850 mg  850 mg Oral BID WC [name], [name], MD   850 mg at 06/08/20 0831  . metoprolol succinate (TOPROL-XL) 24 hr tablet 50 mg  50 mg Oral Daily [name], [name], MD   50 mg at 06/08/20  4098  . ondansetron (ZOFRAN) tablet 4 mg  4 mg Oral Q8H PRN [name], [name], MD   4 mg at 04/30/20 2035  . oxybutynin (DITROPAN) tablet 10 mg  10 mg Oral BID [name], [name], MD   10 mg at 06/08/20 0831  . pantoprazole (PROTONIX) EC tablet 40 mg  40 mg Oral Daily [name], [name], MD   40 mg at 06/08/20 0832  . prazosin (MINIPRESS) capsule 1 mg  1 mg Oral QHS [name], [name], MD   1 mg at 06/06/20 2113  . QUEtiapine (SEROQUEL) tablet 100 mg  100 mg Oral QID [name], [name], MD   100 mg at 06/08/20 0831  . QUEtiapine (SEROQUEL) tablet 400 mg  400 mg Oral QHS [name], NP   400 mg at 06/07/20 2117  . sertraline (ZOLOFT) tablet 50 mg  50 mg Oral BID [name], [name], MD   50 mg at 06/08/20 0832  . simethicone (MYLICON) chewable tablet 80 mg  80 mg Oral TID [name], [name], MD   80 mg at 06/08/20 0831  . simvastatin (ZOCOR) tablet 20 mg  20 mg Oral q1800 [name], [name], MD   20 mg at 06/07/20 1710  . tamsulosin (FLOMAX) capsule 0.8 mg  0.8 mg Oral QPC supper [name], [name], MD   0.8 mg  at 06/07/20 1709   PTA Medications: Medications Prior to Admission  Medication Sig Dispense Refill Last Dose  . acetaminophen (TYLENOL) 650 MG CR tablet Take 650 mg by mouth every 12 (twelve) hours as needed for pain.     Marland Kitchen aspirin EC 81 MG tablet Take 81 mg by mouth daily.     . Calcium Carbonate-Vitamin D3 (CALCIUM 600-D) 600-400 MG-UNIT TABS Take 1 tablet by mouth 2 (two) times daily.     . Cholecalciferol (VITAMIN D-1000 MAX ST) 1000 UNITS tablet Take 1,000 Units by mouth daily.      Marland Kitchen docusate sodium (COLACE) 100 MG capsule Take 100 mg by mouth daily.      . furosemide (LASIX) 20 MG tablet Take 20 mg by mouth daily.      Marland Kitchen gabapentin (NEURONTIN) 100 MG capsule Take 1 capsule (100 mg total) by mouth 3 (three) times daily. 90 capsule 10   . Insulin Detemir (LEVEMIR FLEXTOUCH) 100 UNIT/ML Pen Inject 15 Units into the skin daily at 10 pm.     . lamoTRIgine (LAMICTAL) 100 MG tablet Take 1 tablet (100 mg total) by mouth at bedtime. 30 tablet 10   . loratadine (CLARITIN) 10 MG tablet Take 1 tablet (10 mg total) by mouth daily. 30 tablet 5   . LORazepam (ATIVAN) 1 MG tablet Take 1 tablet (1 mg total) by mouth 3 (three) times daily. 90 tablet 5   . lurasidone (LATUDA) 40 MG TABS tablet Take 1 tablet (40 mg total) by mouth daily. with food 30 tablet 10   . metoprolol succinate (TOPROL-XL) 50 MG 24 hr tablet Take 50 mg by mouth daily.      . NON FORMULARY cpap device     . oxybutynin (DITROPAN) 5 MG tablet Take 5 mg by mouth daily.     . pantoprazole (PROTONIX) 40 MG tablet Take 40 mg by mouth daily.     . polyethylene glycol (MIRALAX / GLYCOLAX) packet Take 17 g by mouth daily.     . QUEtiapine (SEROQUEL) 100 MG tablet TAKE 1 TABLET  BY MOUTH THREE TIMES A DAY (Patient taking differently: Take 100 mg  by mouth 3 (three) times daily. TAKE 1 TABLET  BY MOUTH THREE TIMES A DAY) 90 tablet 11   . QUEtiapine (SEROQUEL) 400 MG tablet Take 1 tablet (400 mg total) by mouth at bedtime. 30 tablet 10   .  sertraline (ZOLOFT) 100 MG tablet Take 2 tablets (200 mg total) by mouth daily. (Patient taking differently: Take 200 mg by mouth daily. Take along with two 50 mg tablets (100 mg) for total 300 mg daily) 60 tablet 5   . sertraline (ZOLOFT) 50 MG tablet Take 2 tablets (100 mg total) by mouth daily. (Patient taking differently: Take 100 mg by mouth daily. Take along with two 100 mg tablets (200 mg) for total 300 mg daily) 60 tablet 5   . simethicone (MYLICON) 80 MG chewable tablet Chew 80-160 mg by mouth 2 (two) times daily as needed for flatulence.     . simvastatin (ZOCOR) 20 MG tablet Take 20 mg by mouth at bedtime.      . sitaGLIPtin (JANUVIA) 100 MG tablet Take 100 mg by mouth daily.     . solifenacin (VESICARE) 5 MG tablet Take 5 mg by mouth daily.     . Starch (HEMORRHOIDAL RE) Place 1 application rectally 4 (four) times daily as needed (for itching).     . tamsulosin (FLOMAX) 0.4 MG CAPS capsule Take 0.4 mg by mouth daily.       Patient Stressors: Health problems Marital or family conflict Medication change or noncompliance Traumatic event  Patient Strengths: Motivation for treatment/growth Religious Affiliation  Treatment Modalities: Medication Management, Group therapy, Case management,  1 to 1 session with clinician, Psychoeducation, Recreational therapy.   Physician Treatment Plan for Primary Diagnosis: Schizoaffective disorder, bipolar type ([place]) Long Term Goal(s): Improvement in symptoms so as ready for discharge Improvement in symptoms so as ready for discharge   Short Term Goals: Ability to verbalize feelings will improve Ability to demonstrate self-control will improve Ability to maintain clinical measurements within normal limits will improve Compliance with prescribed medications will improve  Medication Management: Evaluate patient's response, side effects, and tolerance of medication regimen.  Therapeutic Interventions: 1 to 1 sessions, Unit Group sessions and  Medication administration.  Evaluation of Outcomes: Adequate for Discharge  Physician Treatment Plan for Secondary Diagnosis: Principal Problem:   Schizoaffective disorder, bipolar type ([place]) Active Problems:   Active autistic disorder   Diabetes ([place])   Hypertension   Prostate hypertrophy   Intellectual disability   At risk for elopement  Long Term Goal(s): Improvement in symptoms so as ready for discharge Improvement in symptoms so as ready for discharge   Short Term Goals: Ability to verbalize feelings will improve Ability to demonstrate self-control will improve Ability to maintain clinical measurements within normal limits will improve Compliance with prescribed medications will improve     Medication Management: Evaluate patient's response, side effects, and tolerance of medication regimen.  Therapeutic Interventions: 1 to 1 sessions, Unit Group sessions and Medication administration.  Evaluation of Outcomes: Adequate for Discharge   RN Treatment Plan for Primary Diagnosis: Schizoaffective disorder, bipolar type ([place]) Long Term Goal(s): Knowledge of disease and therapeutic regimen to maintain health will improve  Short Term Goals: Ability to demonstrate self-control, Ability to participate in decision making will improve, Ability to verbalize feelings will improve, Ability to identify and develop effective coping behaviors will improve and Compliance with prescribed medications will improve  Medication Management: RN will administer medications as ordered by provider, will assess and evaluate patient's response and provide  education to patient for prescribed medication. RN will report any adverse and/or side effects to prescribing provider.  Therapeutic Interventions: 1 on 1 counseling sessions, Psychoeducation, Medication administration, Evaluate responses to treatment, Monitor vital signs and CBGs as ordered, Perform/monitor CIWA, COWS, AIMS and Fall Risk screenings as  ordered, Perform wound care treatments as ordered.  Evaluation of Outcomes: Adequate for Discharge   LCSW Treatment Plan for Primary Diagnosis: Schizoaffective disorder, bipolar type ([place]) Long Term Goal(s): Safe transition to appropriate next level of care at discharge, Engage patient in therapeutic group addressing interpersonal concerns.  Short Term Goals: Engage patient in aftercare planning with referrals and resources, Increase social support, Increase ability to appropriately verbalize feelings, Increase emotional regulation and Increase skills for wellness and recovery  Therapeutic Interventions: Assess for all discharge needs, 1 to 1 time with Social worker, Explore available resources and support systems, Assess for adequacy in community support network, Educate family and significant other(s) on suicide prevention, Complete Psychosocial Assessment, Interpersonal group therapy.  Evaluation of Outcomes: Adequate for Discharge   Progress in Treatment: Attending groups: Yes. Participating in groups: Yes. Taking medication as prescribed: Yes. Toleration medication: Yes. Family/Significant other contact made: Yes, individual(s) contacted:  [place], brother  Patient understands diagnosis: Yes. Discussing patient identified problems/goals with staff: Yes. Medical problems stabilized or resolved: Yes. Denies suicidal/homicidal ideation: Yes. Issues/concerns per patient self-inventory: No. Other: N/A  New problem(s) identified: No, Describe:  None   New Short Term/Long Term Goal(s): Elimination of symptoms of psychosis, medication management for mood stabilization; elimination of SI thoughts; development of comprehensive mental wellness/sobriety plan.Update 03/01/20:No changes at this time. 03/07/20: Update, no changes at this time Update 03/12/2020: No changes at this time.Update 03/17/2020:No changes at this time. Update 03/21/20:No changes at this time. Update 03/26/20: No  changes at this time. Update 03/31/2020: No changes at this time.Update 04/04/20: No changes at this time Update 04/09/2020: No changes at this time. Update 04/14/2020: No changes at this time. Update 04/20/2020:No changes at this time. Update: 04/25/2020: No changes at this time. Update 04/30/2020: No changes at this time.Update 05/05/2020: No changes at this time. Update 05/09/20: No changes at this time. Update 05/14/20: No changes at this time.Update 05/19/2020: No changes at this time. Update 05/23/20: No changes at this time. Update 05/29/20:  No changes at this time. Update 06/03/2020:  No updates at this time. Update 06/08/20: Pt adequately addressed his goals sufficient for discharge.   Patient Goals:  Patient stated he would like to go back to [place] and reconnect with his peer support. Patient also stated that he would like to increase his social support and help his brother.Update 03/07/20,no change Update 03/12/2020: No changes at this time. Update 03/17/2020:No changes at this time.Update 03/21/20: No changes at this time. Update 03/26/20: No changes at this time. Update 03/31/2020: No changes at this time. Update9/25/21:No changes at this time. Update 04/09/2020: No changes at this time. Update 04/14/2020: No changes at this time. Update 04/20/2020:No changes at this time. 04/25/2020: Patient still indicates that he would like to go back to his old ways and life. Patient would like to get back into the community. 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 Update 04/30/2020: No changes at this time.Update 05/05/2020: No changes at this time. Patient maintains that he wants to get back to his old way of life, volunteer in the community, do his artwork, and help his brother. Update 05/09/20: No changes at this time. Update 05/14/20: No changes at this  time.  Update 05/19/2020: No changes at this time. Update 05/23/20: No changes at this time. Update 05/29/20: No changes at this time.   Update 06/03/2020:  No updates at this time. Update 06/08/20: Pt adequately addressed his goals sufficient for discharge.    Discharge Plan or Barriers: Patient has an interview with a group home on 03/09/20 at 2:00 PM. Update 03/12/2020: CSW continues to assist the patient in developing a housing plan.Update 03/17/2020:CSW has assisted patient in completing an interview with a group home, however, there has been no word on if the patient has been approved. Patient continues to struggle with his anxiety and nerves. Nurses report that he has increased in bed wetting. Psychiatrist and nurses believe that this may be related to his increased anxiety about finding a home.Update 03/21/20: Patient is still nervous and anxious about finding a new placement. Patient stated he could not stay here too much longer and wanted his life back. Update 03/26/20: CSW continues to look for placement for pt. Group Homes, [place] are being considered. Update 03/31/2020: Patient continues to have interview with group homes, however, no success at this time in identifying a home that has accepted him.Update: 04/04/20:No changes at this time. Update 04/09/2020: CSW continues to look for placement for patient. Update 04/14/2020: No changes at this time. CSW continues to look for placement. Update 04/20/2020:CSW continues to assist with placement needs.04/25/2020: CSW continues to look for placement options. Update 04/30/2020: CSW continues to look for placement for the patient. Patient has been declined for 17 SNF's however, there are a quite a few where the referral is pending. CSW has contacted several group homes, however, none have selected the patient. Update 05/05/2020: CSW continues to look for placement for the patient. Patient has been declined at 78 SNF's with quite a few referrals left pending. CSW also sent out referrals for memory care facilities. CSW continues to  contact groups homes without any selecting the patient at the moment. Update 05/09/20: CSW will continue to find appropriate placement for patient. Update 05/14/20: CSW will continue to find appropriate placement for patient.  Update 05/19/2020: CSW will continue to assist patient in identifying appropriate placement. Update 05/23/2020: CSW will continue to assist patient in identifying appropriate placement. Update 05/29/20: CSW has found group home placement. Pt brother would like to speak to group home owner and states he has been in contact with [facility] regarding the [facility]. CSW has verified that [facility] does not have a guaranteed bed for pt and was encouraged to go with group home which accepted pt. As of this moment, CSW has been unable to reach brother to facilitate conference call between group home and brother.  Update 06/03/2020:  CSW has spoken with Mr. Humphries, who reports that the patient will be able to transition to his group home on Monday 06/08/2020.  Patient is need of Covid test and new TB test. Update 06/08/20: Patient to discharge today to group home. Mr. Reece Agar agreed to provide transportation. Aftercare appointment has been arranged through [facility].     Reason for Continuation of Hospitalization: Depression Medication stabilization  Estimated Length of Stay: TBD  Attendees: Patient:  06/08/2020 9:13 AM  Physician: Selina Cooley, MD 06/08/2020 9:13 AM  Nursing:  06/08/2020 9:13 AM  RN Care Manager: 06/08/2020 9:13 AM  Social Worker: Assunta Curtis, MSW, LCSW 06/08/2020 9:13 AM  Recreational Therapist:  06/08/2020 9:13 AM  Other: Paulla Dolly, Latanya Presser, MSW  06/08/2020 9:13 AM  Other: Chalmers Guest. [name], MSW, [place], [place] 06/08/2020 9:13 AM  Other: 06/08/2020 9:13 AM    Scribe for Treatment Team: Shirl Harris, LCSW 06/08/2020 9:13 AM

## 2020-06-09 ENCOUNTER — Other Ambulatory Visit: Payer: Self-pay

## 2020-06-09 ENCOUNTER — Encounter: Payer: Self-pay | Admitting: Emergency Medicine

## 2020-06-09 ENCOUNTER — Emergency Department
Admission: EM | Admit: 2020-06-09 | Discharge: 2020-06-12 | Disposition: A | Discharge status: Home / Self Care | Payer: Medicare Other | Attending: Emergency Medicine | Admitting: Emergency Medicine

## 2020-06-09 DIAGNOSIS — F840 Autistic disorder: Secondary | ICD-10-CM | POA: Diagnosis present

## 2020-06-09 DIAGNOSIS — E119 Type 2 diabetes mellitus without complications: Secondary | ICD-10-CM | POA: Insufficient documentation

## 2020-06-09 DIAGNOSIS — Z20822 Contact with and (suspected) exposure to covid-19: Secondary | ICD-10-CM | POA: Diagnosis not present

## 2020-06-09 DIAGNOSIS — J449 Chronic obstructive pulmonary disease, unspecified: Secondary | ICD-10-CM | POA: Insufficient documentation

## 2020-06-09 DIAGNOSIS — Z79899 Other long term (current) drug therapy: Secondary | ICD-10-CM | POA: Diagnosis not present

## 2020-06-09 DIAGNOSIS — F259 Schizoaffective disorder, unspecified: Secondary | ICD-10-CM | POA: Diagnosis not present

## 2020-06-09 DIAGNOSIS — F79 Unspecified intellectual disabilities: Secondary | ICD-10-CM

## 2020-06-09 DIAGNOSIS — R45851 Suicidal ideations: Secondary | ICD-10-CM | POA: Diagnosis not present

## 2020-06-09 DIAGNOSIS — F89 Unspecified disorder of psychological development: Secondary | ICD-10-CM | POA: Insufficient documentation

## 2020-06-09 DIAGNOSIS — I10 Essential (primary) hypertension: Secondary | ICD-10-CM | POA: Diagnosis not present

## 2020-06-09 DIAGNOSIS — R451 Restlessness and agitation: Secondary | ICD-10-CM | POA: Diagnosis present

## 2020-06-09 DIAGNOSIS — F250 Schizoaffective disorder, bipolar type: Secondary | ICD-10-CM | POA: Diagnosis present

## 2020-06-09 DIAGNOSIS — Z7984 Long term (current) use of oral hypoglycemic drugs: Secondary | ICD-10-CM | POA: Diagnosis not present

## 2020-06-09 DIAGNOSIS — Z7982 Long term (current) use of aspirin: Secondary | ICD-10-CM | POA: Diagnosis not present

## 2020-06-09 DIAGNOSIS — Z9189 Other specified personal risk factors, not elsewhere classified: Secondary | ICD-10-CM

## 2020-06-09 LAB — CBC
HCT: 45.8 % (ref 39.0–52.0)
Hemoglobin: 15.2 g/dL (ref 13.0–17.0)
MCH: 31 pg (ref 26.0–34.0)
MCHC: 33.2 g/dL (ref 30.0–36.0)
MCV: 93.3 fL (ref 80.0–100.0)
Platelets: 200 10*3/uL (ref 150–400)
RBC: 4.91 MIL/uL (ref 4.22–5.81)
RDW: 12.3 % (ref 11.5–15.5)
WBC: 9.1 10*3/uL (ref 4.0–10.5)
nRBC: 0 % (ref 0.0–0.2)

## 2020-06-09 LAB — COMPREHENSIVE METABOLIC PANEL
ALT: 8 U/L (ref 0–44)
AST: 21 U/L (ref 15–41)
Albumin: 3.9 g/dL (ref 3.5–5.0)
Alkaline Phosphatase: 64 U/L (ref 38–126)
Anion gap: 12 (ref 5–15)
BUN: 21 mg/dL (ref 8–23)
CO2: 26 mmol/L (ref 22–32)
Calcium: 9.4 mg/dL (ref 8.9–10.3)
Chloride: 103 mmol/L (ref 98–111)
Creatinine, Ser: 1.01 mg/dL (ref 0.61–1.24)
GFR, Estimated: >60 mL/min (ref >60)
Glucose, Bld: 120 mg/dL — ABNORMAL HIGH (ref 70–99)
Potassium: 4.1 mmol/L (ref 3.5–5.1)
Sodium: 141 mmol/L (ref 135–145)
Total Bilirubin: 0.8 mg/dL (ref 0.3–1.2)
Total Protein: 7.7 g/dL (ref 6.5–8.1)

## 2020-06-09 LAB — SALICYLATE LEVEL: Salicylate Lvl: <7 mg/dL — ABNORMAL LOW (ref 7.0–30.0)

## 2020-06-09 LAB — ACETAMINOPHEN LEVEL: Acetaminophen (Tylenol), Serum: <10 ug/mL — ABNORMAL LOW (ref 10–30)

## 2020-06-09 LAB — ETHANOL: Alcohol, Ethyl (B): <10 mg/dL (ref <10)

## 2020-06-09 NOTE — [facility] Counselor (Signed)
CSW enters note post discharge.    CSW attempted to contact Roslyn Smiling, Care Coordinator, [phone].  CSW left HIPAA compliant voicemail requesting return call.  Contact was in effort to inform of patient's discharge.   CSW returned call from Seward Carol, who's group home pt went to.  CSW left another HIPAA compliant voicemail.    Assunta Curtis, MSW, LCSW 06/09/2020 12:08 PM

## 2020-06-09 NOTE — ED Notes (Signed)
Hourly rounding completed at this time, patient currently asleep in room. No complaints, stable, and in no acute distress. Q15 minute rounds and monitoring via Rover and Officer to continue. 

## 2020-06-09 NOTE — ED Notes (Signed)
Report received from [name], RN including Situation, Background, Assessment, and Recommendations. Patient alert and oriented, warm and dry, and in no acute distress. Patient denies SI, HI, AVH and pain. Patient made aware of Q15 minute rounds and Engineer, drilling presence for their safety. Patient instructed to come to this nurse with needs or concerns.

## 2020-06-09 NOTE — ED Notes (Signed)
TTS and psych to pt room at this time

## 2020-06-09 NOTE — ED Notes (Signed)
Pt brother, [name] contacts this nurse at this time, provides number [phone]) [phone] and requests that in case of emergency to call.

## 2020-06-09 NOTE — ED Provider Notes (Addendum)
[facility] Emergency Department Provider Note   ____________________________________________   First MD Initiated Contact with Patient 06/09/20 1943     (approximate)  I have reviewed the triage vital signs and the nursing notes.   HISTORY  Chief Complaint Psychiatric Evaluation    HPI Alexander Beard is a 64 y.o. male who was discharged from psychiatry yesterday with anxiety confusion agitation and mood instability schizoaffective disorder bipolar type I to his group home.  He ran away from his group home was found by the police walking down the street barefoot.  He was yelling and screaming about how no one understands him.  He then ran away from the police.  He was brought here is very tearful and somewhat agitated.  He has mentioned suicidal ideation today.         Past Medical History:  Diagnosis Date  . Anxiety   . Anxiety disorder due to known physiological condition    HOSPITALIZED 10/18  . Arthritis   . Autistic disorder, residual state   . COPD (chronic obstructive pulmonary disease) ([place])   . Depression   . Developmental disorder   . Diabetes mellitus without complication ([place])   . Dyslipidemia   . Esophageal reflux   . HOH (hard of hearing)    MILDLY  . Hypertension   . Obesity   . Overactive bladder   . Palpitations    ANXIETY  . Schizophrenia, schizoaffective ([place])   . Sleep apnea   . Tremors of nervous system    HANDS DUE TO MEDICATIONS  . Urinary incontinence     Patient Active Problem List   Diagnosis Date Noted  . Intellectual disability 03/06/2020  . At risk for elopement 03/06/2020  . Diabetes ([place]) 04/10/2015  . Hypertension 04/10/2015  . Prostate hypertrophy 04/10/2015  . Schizoaffective disorder ([place]) 04/10/2015  . Enlarged prostate 04/10/2015  . Essential (primary) hypertension 04/10/2015  . Type 2 diabetes mellitus ([place]) 04/10/2015  . Controlled type 2 diabetes mellitus without complication ([place])  97/67/3419  . Schizoaffective disorder, bipolar type ([place]) 12/29/2014  . Active autistic disorder 12/29/2014    Past Surgical History:  Procedure Laterality Date  . CATARACT EXTRACTION W/PHACO Left 11/14/2017   Procedure: CATARACT EXTRACTION PHACO AND INTRAOCULAR LENS PLACEMENT (IOC);  Surgeon: Birder Robson, MD;  Location: [facility] ORS;  Service: Ophthalmology;  Laterality: Left;  [place] 00:42 AP% 14.6 CDE 6.12 Fluid pack lot # [id number]  . COLONOSCOPY  01/28/2005  . COLONOSCOPY WITH PROPOFOL N/A 05/09/2016   Procedure: COLONOSCOPY WITH PROPOFOL;  Surgeon: Manya Silvas, MD;  Location: [facility] ENDOSCOPY;  Service: Endoscopy;  Laterality: N/A;  . ESOPHAGOGASTRODUODENOSCOPY N/A 05/09/2016   Procedure: ESOPHAGOGASTRODUODENOSCOPY (EGD);  Surgeon: Manya Silvas, MD;  Location: [facility] ENDOSCOPY;  Service: Endoscopy;  Laterality: N/A;  . FRACTURE SURGERY     ORIF SHOULDER  . TONSILLECTOMY      Prior to Admission medications   Medication Sig Start Date End Date Taking? Authorizing Provider  acetaminophen (TYLENOL) 650 MG CR tablet Take 650 mg by mouth every 12 (twelve) hours as needed for pain.    [provider]  aspirin EC 81 MG EC tablet Take 1 tablet (81 mg total) by mouth daily. Swallow whole. 06/09/20   [name], [name], MD  clonazePAM (KLONOPIN) 1 MG tablet Take 1 tablet (1 mg total) by mouth 4 (four) times daily. 06/08/20   [name], [name], MD  divalproex (DEPAKOTE ER) 500 MG 24 hr tablet Take 3 tablets (1,500  mg total) by mouth at bedtime. 06/08/20   [name], [name], MD  gabapentin (NEURONTIN) 100 MG capsule Take 1 capsule (100 mg total) by mouth 3 (three) times daily. 06/08/20   [name], [name], MD  hydrOXYzine (ATARAX/VISTARIL) 10 MG tablet Take 1 tablet (10 mg total) by mouth 3 (three) times daily as needed for anxiety. 06/08/20   [name], [name], MD  lamoTRIgine (LAMICTAL) 150 MG tablet Take 1 tablet (150 mg total) by mouth at bedtime. 06/08/20   [name], [name], MD    linagliptin (TRADJENTA) 5 MG TABS tablet Take 1 tablet (5 mg total) by mouth daily. 06/09/20   [name], [name], MD  loratadine (CLARITIN) 10 MG tablet Take 1 tablet (10 mg total) by mouth daily. 06/08/20   [name], [name], MD  LORazepam (ATIVAN) 2 MG tablet Take 1 tablet (2 mg total) by mouth every 6 (six) hours as needed (severe anxiety). 06/08/20   [name], [name], MD  metFORMIN (GLUCOPHAGE) 850 MG tablet Take 1 tablet (850 mg total) by mouth 2 (two) times daily with a meal. 06/08/20   [name], [name], MD  metoprolol succinate (TOPROL-XL) 50 MG 24 hr tablet Take 1 tablet (50 mg total) by mouth daily. 06/09/20   [name], [name], MD  oxybutynin (DITROPAN) 5 MG tablet Take 2 tablets (10 mg total) by mouth 2 (two) times daily. 06/08/20   [name], [name], MD  pantoprazole (PROTONIX) 40 MG tablet Take 1 tablet (40 mg total) by mouth daily. 06/08/20   [name], [name], MD  prazosin (MINIPRESS) 1 MG capsule Take 1 capsule (1 mg total) by mouth at bedtime. 06/08/20   [name], [name], MD  QUEtiapine (SEROQUEL) 100 MG tablet Take 1 tablet (100 mg total) by mouth 4 (four) times daily. 06/08/20   [name], [name], MD  QUEtiapine (SEROQUEL) 400 MG tablet Take 1 tablet (400 mg total) by mouth at bedtime. 06/08/20   [name], [name], MD  sertraline (ZOLOFT) 50 MG tablet Take 1 tablet (50 mg total) by mouth 2 (two) times daily. 06/08/20   [name], [name], MD  simethicone (MYLICON) 80 MG chewable tablet Chew 1 tablet (80 mg total) by mouth 3 (three) times daily. 06/08/20   [name], [name], MD  simvastatin (ZOCOR) 20 MG tablet Take 1 tablet (20 mg total) by mouth daily at 6 PM. 06/08/20   [name], [name], MD  tamsulosin (FLOMAX) 0.4 MG CAPS capsule Take 2 capsules (0.8 mg total) by mouth daily after supper. 06/08/20   [name], [name], MD    Allergies Other, Penicillins, and Cortizone-10 [hydrocortisone]  Family History  Problem Relation Age of Onset  . Hypertension Mother   . Stroke Father   . Heart Problems  Father     Social History Social History   Tobacco Use  . Smoking status: Never Smoker  . Smokeless tobacco: Never Used  Vaping Use  . Vaping Use: Never used  Substance Use Topics  . Alcohol use: No    Alcohol/week: 0.0 standard drinks  . Drug use: No    Review of Systems  Constitutional: No fever/chills Eyes: No visual changes. ENT: No sore throat. Cardiovascular: Denies chest pain. Respiratory: Denies shortness of breath. Gastrointestinal: No abdominal pain.  No nausea, no vomiting.  No diarrhea.  No constipation. Genitourinary: Negative for dysuria. Musculoskeletal: Negative for back pain. Skin: Negative for rash. Neurological: Negative for headaches, focal weakness  ____________________________________________   PHYSICAL EXAM:  VITAL SIGNS: ED Triage Vitals  Enc Vitals Group     BP 06/09/20 1831 135/88     Pulse Rate 06/09/20 1831 83     Resp 06/09/20 1831 (!) 24     Temp 06/09/20 1836 98.3 F (36.8 C)     Temp Source 06/09/20 1836 Oral     SpO2 06/09/20 1831 99 %     Weight 06/09/20 1832 229 lb 15 oz (104.3 kg)     Height 06/09/20 1832 5\' 8"  (1.727 m)     Head Circumference --      Peak Flow --      Pain Score 06/09/20 1832 0     Pain Loc --      Pain Edu? --      Excl. in [place]? --     Constitutional: Alert and oriented.  Crying and shaking head anxious and agitated Eyes: Conjunctivae are normal. PER Head: Atraumatic. Nose: No congestion/rhinnorhea. Mouth/Throat: Mucous membranes are moist.  Oropharynx non-erythematous. Neck: No stridor.   Cardiovascular: Normal rate, regular rhythm. Grossly normal heart sounds.  Good peripheral circulation. Respiratory: Normal respiratory effort.  No retractions. Lungs CTAB. Gastrointestinal: Soft and nontender. No distention. No abdominal bruits. No CVA tenderness. Musculoskeletal: No lower extremity tenderness nor edema.  Neurologic:  Normal speech and language. No gross focal neurologic deficits are appreciated.  No gait instability. Skin:  Skin is warm, dry and intact. No rash noted.   ____________________________________________   LABS (all labs ordered are listed, but only abnormal results are displayed)  Labs Reviewed  COMPREHENSIVE METABOLIC PANEL - Abnormal; Notable for the following components:      Result Value   Glucose, Bld 120 (*)    All other components within normal limits  SALICYLATE LEVEL - Abnormal; Notable for the following components:   Salicylate Lvl <1.6 (*)    All other components within normal limits  ACETAMINOPHEN LEVEL - Abnormal; Notable for the following components:   Acetaminophen (Tylenol), Serum <10 (*)    All other components within normal limits  ETHANOL  CBC  URINE DRUG SCREEN, QUALITATIVE ([facility] ONLY)   ____________________________________________  EKG   ____________________________________________  RADIOLOGY Gertha Calkin, personally viewed and evaluated these images (plain radiographs) as part of my medical decision making, as well as reviewing the written report by the radiologist.  ED MD interpretation:    Official radiology report(s): No results found.  ____________________________________________   PROCEDURES  Procedure(s) performed (including Critical Care):  Procedures   ____________________________________________   INITIAL IMPRESSION / ASSESSMENT AND PLAN / ED COURSE Patient is very agitated in no shape to go anywhere.  We will watch him at least overnight see if he will calm down resume his medications psych can see him in the morning.  If possible he may need his medicines adjusted we may found out that something upset him at the group home.      The patient has been placed in psychiatric observation due to the need to provide a safe environment for the patient while obtaining psychiatric consultation and evaluation, as well as ongoing medical and medication management to treat the patient's condition.  The patient has  been placed under full IVC at this time.       ____________________________________________   FINAL CLINICAL IMPRESSION(S) / ED DIAGNOSES  Final diagnoses:  Schizoaffective disorder, unspecified type [facility])     ED Discharge Orders    None      *Please note:  BIJAN RIDGLEY was evaluated in Emergency Department on 06/09/2020  for the symptoms described in the history of present illness. He was evaluated in the context of the global COVID-19 pandemic, which necessitated consideration that the patient might be at risk for infection with the SARS-CoV-2 virus that causes COVID-19. Institutional protocols and algorithms that pertain to the evaluation of patients at risk for COVID-19 are in a state of rapid change based on information released by regulatory bodies including the CDC and federal and state organizations. These policies and algorithms were followed during the patient's care in the ED.  Some ED evaluations and interventions may be delayed as a result of limited staffing during and the pandemic.*   Note:  This document was prepared using Dragon voice recognition software and may include unintentional dictation errors.    Nena Polio, MD 06/09/20 2221    Nena Polio, MD 06/09/20 2223

## 2020-06-09 NOTE — BH Assessment (Signed)
Assessment Note  Alexander Beard is an 64 y.o. male presenting to [facility] ED under IVC. Per triage note Pt to ED via [facility]. Per IVC papers pt left his care home and was found walking down the road barefoot by law enforcement. Per IVC papers pt attempted to run from law enforcement when approached. Pt arrives to ED very tearful, states he is unhappy with his current living situation because he wants to live with "younger, fun people" and he wants his "authority" back. Pt able to be redirected in triage, tearful but cooperative in triage with staff. Pt endorses thoughts of wanting to hurt himself, pt states "all the time, but I never go through with it". During assessment patient appears alert and oriented x4, tearful and depressed. Patient had just recently been discharged from [facility] on 11/29 and was admitted back in 02/2020. Patient has just been placed in a new care home and now presents and reports that he is unhappy with his living situation. Patient was tearful during assessment and reported "I just need to get my thoughts together." Patient had difficulty answering any other questions and continued to be tearful with his head down.  Per Psyc NP Ysidro Evert patient to be observed overnight and reassessed in the morning  Diagnosis: Depression  Past Medical History:  Past Medical History:  Diagnosis Date  . Anxiety   . Anxiety disorder due to known physiological condition    HOSPITALIZED 10/18  . Arthritis   . Autistic disorder, residual state   . COPD (chronic obstructive pulmonary disease) ([place])   . Depression   . Developmental disorder   . Diabetes mellitus without complication ([place])   . Dyslipidemia   . Esophageal reflux   . HOH (hard of hearing)    MILDLY  . Hypertension   . Obesity   . Overactive bladder   . Palpitations    ANXIETY  . Schizophrenia, schizoaffective ([place])   . Sleep apnea   . Tremors of nervous system    HANDS DUE TO MEDICATIONS  . Urinary incontinence      Past Surgical History:  Procedure Laterality Date  . CATARACT EXTRACTION W/PHACO Left 11/14/2017   Procedure: CATARACT EXTRACTION PHACO AND INTRAOCULAR LENS PLACEMENT (IOC);  Surgeon: Birder Robson, MD;  Location: [facility] ORS;  Service: Ophthalmology;  Laterality: Left;  [place] 00:42 AP% 14.6 CDE 6.12 Fluid pack lot # [id number]  . COLONOSCOPY  01/28/2005  . COLONOSCOPY WITH PROPOFOL N/A 05/09/2016   Procedure: COLONOSCOPY WITH PROPOFOL;  Surgeon: Manya Silvas, MD;  Location: [facility] ENDOSCOPY;  Service: Endoscopy;  Laterality: N/A;  . ESOPHAGOGASTRODUODENOSCOPY N/A 05/09/2016   Procedure: ESOPHAGOGASTRODUODENOSCOPY (EGD);  Surgeon: Manya Silvas, MD;  Location: [facility] ENDOSCOPY;  Service: Endoscopy;  Laterality: N/A;  . FRACTURE SURGERY     ORIF SHOULDER  . TONSILLECTOMY      Family History:  Family History  Problem Relation Age of Onset  . Hypertension Mother   . Stroke Father   . Heart Problems Father     Social History:  reports that he has never smoked. He has never used smokeless tobacco. He reports that he does not drink alcohol and does not use drugs.  Additional Social History:  Alcohol / Drug Use Pain Medications: See MAR Prescriptions: See MAR Over the Counter: See MAR History of alcohol / drug use?: No history of alcohol / drug abuse  CIWA: CIWA-Ar BP: (!) 135/97 Pulse Rate: 75 COWS:    Allergies:  Allergies  Allergen Reactions  .  Other Other (See Comments)    MYACINS  . Penicillins Nausea And Vomiting  . Cortizone-10 [Hydrocortisone] Rash    Home Medications: (Not in a hospital admission)   OB/GYN Status:  No LMP for male patient.  General Assessment Data Location of Assessment: [facility] ED TTS Assessment: In system Is this a Tele or Face-to-Face Assessment?: Face-to-Face Is this an Initial Assessment or a Re-assessment for this encounter?: Initial Assessment Patient Accompanied by:: N/A Language Other than English: No Living Arrangements: In Group  Home: (Comment: Name of [place]) What gender do you identify as?: Male Marital status: Single Pregnancy Status: No Living Arrangements: Group Home Can pt return to current living arrangement?: Yes Admission Status: Involuntary Petitioner: Other Is patient capable of signing voluntary admission?: No Referral Source: Other Insurance type: Medicare  Medical Screening Exam ([place]) Medical Exam completed: Yes  Crisis Care Plan Living Arrangements: Group Home Legal Guardian: Other: (Self) Name of Psychiatrist: Unknown Name of Therapist: Unknown  Education Status Is patient currently in school?: No Is the patient employed, unemployed or receiving disability?: Receiving disability income  Risk to self with the past 6 months Suicidal Ideation: No-Not Currently/Within Last 6 Months Has patient been a risk to self within the past 6 months prior to admission? : No Suicidal Intent: No-Not Currently/Within Last 6 Months Has patient had any suicidal intent within the past 6 months prior to admission? : Yes Is patient at risk for suicide?: No Suicidal Plan?: No Has patient had any suicidal plan within the past 6 months prior to admission? : No Access to Means: No What has been your use of drugs/alcohol within the last 12 months?: None Previous Attempts/Gestures:  (UTA) How many times?:  (UTA) Other Self Harm Risks: None Triggers for Past Attempts: None known Intentional Self Injurious Behavior: None Family Suicide History: No Recent stressful life event(s): Other (Comment) (New Group home) Persecutory voices/beliefs?: No Depression: Yes Depression Symptoms: Tearfulness, Isolating, Fatigue, Loss of interest in usual pleasures, Feeling worthless/self pity Substance abuse history and/or treatment for substance abuse?: No Suicide prevention information given to non-admitted patients: Not applicable  Risk to Others within the past 6 months Homicidal Ideation: No Does  patient have any lifetime risk of violence toward others beyond the six months prior to admission? : No Thoughts of Harm to Others: No Current Homicidal Intent: No Current Homicidal Plan: No Access to Homicidal Means: No Identified Victim: NOne History of harm to others?: No Assessment of Violence: None Noted Violent Behavior Description: None Does patient have access to weapons?: No Criminal Charges Pending?: No Does patient have a court date: No Is patient on probation?: No  Psychosis Hallucinations: None noted Delusions: None noted  Mental Status Report Appearance/Hygiene: In scrubs Eye Contact: Poor Motor Activity: Freedom of movement Speech: Logical/coherent Level of Consciousness: Alert Mood: Depressed Affect: Appropriate to circumstance Anxiety Level: Moderate Thought Processes: Coherent Judgement: Unimpaired Orientation: Person, Place, Time, Situation, Appropriate for developmental age Obsessive Compulsive Thoughts/Behaviors: None  Cognitive Functioning Concentration: Normal Memory: Recent Intact, Remote Intact Is patient IDD: No Insight: Poor Impulse Control: Poor Appetite: Fair Have you had any weight changes? : No Change Sleep: No Change Total Hours of Sleep: 7 Vegetative Symptoms: None  ADLScreening [facility] Assessment Services) Patient's cognitive ability adequate to safely complete daily activities?: Yes Patient able to express need for assistance with ADLs?: Yes Independently performs ADLs?: Yes (appropriate for developmental age)  Prior Inpatient Therapy Prior Inpatient Therapy: Yes Prior Therapy Dates: 02/2020 Prior Therapy Facilty/Provider(s): [facility]  BMU Reason for Treatment: Schizoaffective Disorder  Prior Outpatient Therapy Prior Outpatient Therapy:  (UTA)  ADL Screening (condition at time of admission) Patient's cognitive ability adequate to safely complete daily activities?: Yes Is the patient deaf or have difficulty hearing?: No Does the  patient have difficulty seeing, even when wearing glasses/contacts?: No Does the patient have difficulty concentrating, remembering, or making decisions?: No Patient able to express need for assistance with ADLs?: Yes Does the patient have difficulty dressing or bathing?: No Independently performs ADLs?: Yes (appropriate for developmental age) Does the patient have difficulty walking or climbing stairs?: No Weakness of Legs: None Weakness of Arms/Hands: None  Home Assistive Devices/Equipment Home Assistive Devices/Equipment: None  Therapy Consults (therapy consults require a physician order) PT Evaluation Needed: No OT Evalulation Needed: No SLP Evaluation Needed: No Abuse/Neglect Assessment (Assessment to be complete while patient is alone) Abuse/Neglect Assessment Can Be Completed: Yes Physical Abuse: Denies Verbal Abuse: Denies Sexual Abuse: Denies Exploitation of patient/patient's resources: Denies Self-Neglect: Denies Values / Beliefs Cultural Requests During Hospitalization: None Spiritual Requests During Hospitalization: None Consults Spiritual Care Consult Needed: No Transition of Care Team Consult Needed: No Advance Directives (For Healthcare) Does Patient Have a Medical Advance Directive?: No          Disposition: Per Psyc NP Ysidro Evert patient to be observed overnight and reassessed in the morning Disposition Initial Assessment Completed for this Encounter: Yes  On Site Evaluation by:   Reviewed with Physician:    Leonie Douglas MS [place] 06/09/2020 11:18 PM

## 2020-06-09 NOTE — ED Notes (Signed)
Pt is very tearful on assessment, pt is pressured with his thoughts and speech, feels compelled to share information but is concerned it will hurt him. Pt has tremor in body at this time. Pt later tells nurse that he will share what he is concerned with, states "I am in love with a girl on the board. I don't feel like she loves me though." Then states they had a conversation through voicemail and reports the last sentence she stated was "I don't have you, [place]." Pt continues to cry, will continue to monitor.

## 2020-06-09 NOTE — ED Notes (Signed)
Hourly rounding completed at this time, patient currently awake in room. Pt tearful, stable, and in no acute distress. Q15 minute rounds and monitoring via Engineer, drilling to continue.

## 2020-06-09 NOTE — ED Notes (Signed)
Registration in room to register pt, pt very tearful and crying stating he misses Brandi. Will continue to monitor. Pt able to calm self down after staff leaves room

## 2020-06-09 NOTE — ED Triage Notes (Addendum)
Pt to ED via [facility]. Per IVC papers pt left his care home and was found walking down the road barefoot by law enforcement. Per IVC papers pt attempted to run from law enforcement when approached. Pt arrives to ED very tearful, states he is unhappy with his current living situation because he wants to live with "younger, fun people" and he wants his "authority" back. Pt able to be redirected in triage, tearful but cooperative in triage with staff.   Pt endorses thoughts of wanting to hurt himself, pt states "all the time, but I never go through with it".

## 2020-06-09 NOTE — ED Triage Notes (Signed)
1 black shirt, 1 black jacket, 1 pair blue jeans, 1 pair black socks.   Pt dressed out by this RN and Evelena Peat, EDT.

## 2020-06-10 DIAGNOSIS — F840 Autistic disorder: Secondary | ICD-10-CM | POA: Diagnosis not present

## 2020-06-10 DIAGNOSIS — F259 Schizoaffective disorder, unspecified: Secondary | ICD-10-CM | POA: Diagnosis not present

## 2020-06-10 LAB — URINE DRUG SCREEN, QUALITATIVE (ARMC ONLY)
Amphetamines, Ur Screen: NOT DETECTED
Barbiturates, Ur Screen: NOT DETECTED
Benzodiazepine, Ur Scrn: POSITIVE — AB
Cannabinoid 50 Ng, Ur ~~LOC~~: NOT DETECTED
Cocaine Metabolite,Ur ~~LOC~~: NOT DETECTED
MDMA (Ecstasy)Ur Screen: NOT DETECTED
Methadone Scn, Ur: NOT DETECTED
Opiate, Ur Screen: NOT DETECTED
Phencyclidine (PCP) Ur S: NOT DETECTED
Tricyclic, Ur Screen: POSITIVE — AB

## 2020-06-10 LAB — RESP PANEL BY RT-PCR (FLU A&B, COVID) ARPGX2
Influenza A by PCR: NEGATIVE
Influenza B by PCR: NEGATIVE
SARS Coronavirus 2 by RT PCR: NEGATIVE

## 2020-06-10 MED ORDER — CLONAZEPAM 0.5 MG PO TABS
1.0000 mg | ORAL_TABLET | Freq: Four times a day (QID) | ORAL | Status: DC
  Administered 2020-06-10 – 2020-06-12 (×5): 1 mg via ORAL
  Filled 2020-06-10 (×5): qty 2

## 2020-06-10 MED ORDER — HYDROXYZINE HCL 10 MG PO TABS
10.0000 mg | ORAL_TABLET | Freq: Four times a day (QID) | ORAL | Status: DC | PRN
  Filled 2020-06-10: qty 1

## 2020-06-10 MED ORDER — LINAGLIPTIN 5 MG PO TABS
5.0000 mg | ORAL_TABLET | Freq: Every day | ORAL | Status: DC
  Administered 2020-06-10 – 2020-06-12 (×3): 5 mg via ORAL
  Filled 2020-06-10 (×3): qty 1

## 2020-06-10 MED ORDER — SERTRALINE HCL 50 MG PO TABS
50.0000 mg | ORAL_TABLET | Freq: Two times a day (BID) | ORAL | Status: DC
  Administered 2020-06-10 – 2020-06-12 (×4): 50 mg via ORAL
  Filled 2020-06-10 (×4): qty 1

## 2020-06-10 MED ORDER — QUETIAPINE FUMARATE 200 MG PO TABS
400.0000 mg | ORAL_TABLET | Freq: Every day | ORAL | Status: DC
  Administered 2020-06-10 – 2020-06-12 (×2): 400 mg via ORAL
  Filled 2020-06-10 (×2): qty 2

## 2020-06-10 MED ORDER — LORAZEPAM 2 MG PO TABS
2.0000 mg | ORAL_TABLET | Freq: Four times a day (QID) | ORAL | Status: DC | PRN
  Administered 2020-06-11 – 2020-06-12 (×2): 2 mg via ORAL
  Filled 2020-06-10 (×4): qty 1

## 2020-06-10 MED ORDER — PRAZOSIN HCL 1 MG PO CAPS
1.0000 mg | ORAL_CAPSULE | Freq: Every day | ORAL | Status: DC
  Administered 2020-06-10: 1 mg via ORAL
  Filled 2020-06-10 (×4): qty 1

## 2020-06-10 MED ORDER — GABAPENTIN 100 MG PO CAPS
100.0000 mg | ORAL_CAPSULE | Freq: Three times a day (TID) | ORAL | Status: DC
  Administered 2020-06-10 – 2020-06-12 (×5): 100 mg via ORAL
  Filled 2020-06-10 (×5): qty 1

## 2020-06-10 MED ORDER — METFORMIN HCL 850 MG PO TABS
850.0000 mg | ORAL_TABLET | Freq: Two times a day (BID) | ORAL | Status: DC
  Administered 2020-06-10 – 2020-06-12 (×2): 850 mg via ORAL
  Filled 2020-06-10 (×6): qty 1

## 2020-06-10 MED ORDER — OXYBUTYNIN CHLORIDE 5 MG PO TABS
10.0000 mg | ORAL_TABLET | Freq: Two times a day (BID) | ORAL | Status: DC
  Administered 2020-06-10 – 2020-06-12 (×3): 10 mg via ORAL
  Filled 2020-06-10 (×6): qty 2

## 2020-06-10 MED ORDER — ASPIRIN EC 81 MG PO TBEC
81.0000 mg | DELAYED_RELEASE_TABLET | Freq: Every day | ORAL | Status: DC
  Administered 2020-06-10 – 2020-06-12 (×3): 81 mg via ORAL
  Filled 2020-06-10 (×3): qty 1

## 2020-06-10 MED ORDER — PANTOPRAZOLE SODIUM 40 MG PO TBEC
40.0000 mg | DELAYED_RELEASE_TABLET | Freq: Every day | ORAL | Status: DC
  Administered 2020-06-10 – 2020-06-12 (×3): 40 mg via ORAL
  Filled 2020-06-10 (×3): qty 1

## 2020-06-10 MED ORDER — SIMVASTATIN 10 MG PO TABS
20.0000 mg | ORAL_TABLET | Freq: Every day | ORAL | Status: DC
  Administered 2020-06-10 – 2020-06-12 (×3): 20 mg via ORAL
  Filled 2020-06-10 (×3): qty 2

## 2020-06-10 MED ORDER — TAMSULOSIN HCL 0.4 MG PO CAPS
0.8000 mg | ORAL_CAPSULE | Freq: Every day | ORAL | Status: DC
  Administered 2020-06-10 – 2020-06-12 (×3): 0.8 mg via ORAL
  Filled 2020-06-10 (×3): qty 2

## 2020-06-10 MED ORDER — QUETIAPINE FUMARATE 25 MG PO TABS
100.0000 mg | ORAL_TABLET | Freq: Four times a day (QID) | ORAL | Status: DC
  Administered 2020-06-10 – 2020-06-12 (×5): 100 mg via ORAL
  Filled 2020-06-10 (×5): qty 4

## 2020-06-10 MED ORDER — METOPROLOL SUCCINATE ER 50 MG PO TB24
50.0000 mg | ORAL_TABLET | Freq: Every day | ORAL | Status: DC
  Administered 2020-06-10 – 2020-06-12 (×3): 50 mg via ORAL
  Filled 2020-06-10 (×3): qty 1

## 2020-06-10 MED ORDER — LORAZEPAM 2 MG PO TABS
2.0000 mg | ORAL_TABLET | ORAL | Status: AC
  Administered 2020-06-10: 2 mg via ORAL
  Filled 2020-06-10: qty 1

## 2020-06-10 MED ORDER — LORATADINE 10 MG PO TABS
10.0000 mg | ORAL_TABLET | Freq: Every day | ORAL | Status: DC
  Administered 2020-06-10 – 2020-06-12 (×3): 10 mg via ORAL
  Filled 2020-06-10 (×3): qty 1

## 2020-06-10 MED ORDER — LAMOTRIGINE 25 MG PO TABS
150.0000 mg | ORAL_TABLET | Freq: Every day | ORAL | Status: DC
  Administered 2020-06-10 – 2020-06-12 (×2): 150 mg via ORAL
  Filled 2020-06-10 (×2): qty 1

## 2020-06-10 MED ORDER — DIVALPROEX SODIUM ER 250 MG PO TB24
1500.0000 mg | ORAL_TABLET | Freq: Every day | ORAL | Status: DC
  Administered 2020-06-10 – 2020-06-12 (×2): 1500 mg via ORAL
  Filled 2020-06-10 (×2): qty 6

## 2020-06-10 MED ORDER — SIMETHICONE 80 MG PO CHEW
80.0000 mg | CHEWABLE_TABLET | Freq: Four times a day (QID) | ORAL | Status: DC
  Administered 2020-06-10 – 2020-06-12 (×3): 80 mg via ORAL
  Filled 2020-06-10 (×12): qty 1

## 2020-06-10 NOTE — ED Notes (Signed)
Pt out of room and screaming back down hallway to new pt in EMS bay. Pt is responding to everything the other pt is yelling. Pt is hard to deescalate and continues to scream and cry and be agitated.

## 2020-06-10 NOTE — ED Notes (Signed)
Hourly rounding completed at this time, patient currently asleep in room. No complaints, stable, and in no acute distress. Q15 minute rounds and monitoring via Rover and Officer to continue. 

## 2020-06-10 NOTE — ED Provider Notes (Signed)
Emergency Medicine Observation Re-evaluation Note  Alexander Beard is a 64 y.o. male, seen on rounds today.  Pt initially presented to the ED for complaints of Psychiatric Evaluation Currently, the patient is yelling and agitated.  Physical Exam  BP (!) 135/97   Pulse 75   Temp 99.7 F (37.6 C) (Oral)   Resp 20   Ht 1.727 m (5\' 8" )   Wt 104.3 kg   SpO2 98%   BMI 34.96 kg/m  Physical Exam Gen:  No acute distress Resp:  Breathing easily and comfortably, no accessory muscle usage Neuro:  Moving all four extremities, no gross focal neuro deficits Psych: Patient has been resting comfortably all night, is now awake and agitated for unclear reasons.   ED Course / MDM  EKG:    I have reviewed the labs performed to date as well as medications administered while in observation.  Recent changes in the last 24 hours include initial examination by emergency medicine physician and plan for psychiatry assessment and disposition recommendations..  Plan  Current plan is for psychiatric disposition. ED staff is attempting to redirect the patient now. Transferring ED care to Dr. Corky Downs. Patient is under full IVC at this time.   Hinda Kehr, MD 06/10/20 [phone]

## 2020-06-10 NOTE — Consult Note (Signed)
[facility] Consult   Reason for Consult:   Consult for this 64 year old man with a history of autism who was brought to the emergency room after running away from his group home Referring Physician: Corky Downs Patient Identification: Alexander Beard MRN:  [id number] Principal Diagnosis: Active autistic disorder Diagnosis:  Principal Problem:   Active autistic disorder Active Problems:   Schizoaffective disorder, bipolar type ([place])   Schizoaffective disorder (HCC)   Intellectual disability   At risk for elopement   Total Time spent with patient: 1 hour  Subjective:   Alexander Beard is a 64 y.o. male patient admitted with "I was not happy".  HPI: Patient seen chart reviewed.  Patient very familiar from many past interactions.  64 year old man with autistic spectrum disorder and a diagnosis of schizoaffective disorder and intellectual disability.  He was discharged from the psychiatric ward after an extended stay for placement just 2 days ago.  Brought back under IVC after running away from the group home.  Reportedly was running down the street at night with no shoes on screaming and tried to run away from Event organiser.  Patient acknowledges all of that is true.  Says that he was not happy at the group home.  He is too upset right now and to give me any details about what happened.  Patient is currently agitated and very difficult to speak with.  Tangential.  Making a lot of hysterical statements.  Patient says he does not think they gave him his medicine the way he usually takes it but he is not a reliable historian on this point.  Past Psychiatric History: Patient has a lifelong history of disability.  He has a previous diagnosis of schizoaffective disorder which I think is probably not accurate I think he primarily has autistic spectrum disorder and mild intellectual disability with chronic behavior problems.  Has had a great deal of difficulty staying in any kind of outpatient  placement because of his behavior.  Behavior only partially responsive to medicine  Risk to Self: Suicidal Ideation: No-Not Currently/Within Last 6 Months Suicidal Intent: No-Not Currently/Within Last 6 Months Is patient at risk for suicide?: No Suicidal Plan?: No Access to Means: No What has been your use of drugs/alcohol within the last 12 months?: None How many times?:  (UTA) Other Self Harm Risks: None Triggers for Past Attempts: None known Intentional Self Injurious Behavior: None Risk to Others: Homicidal Ideation: No Thoughts of Harm to Others: No Current Homicidal Intent: No Current Homicidal Plan: No Access to Homicidal Means: No Identified Victim: NOne History of harm to others?: No Assessment of Violence: None Noted Violent Behavior Description: None Does patient have access to weapons?: No Criminal Charges Pending?: No Does patient have a court date: No Prior Inpatient Therapy: Prior Inpatient Therapy: Yes Prior Therapy Dates: 02/2020 Prior Therapy Facilty/Provider(s): [facility] Reason for Treatment: Schizoaffective Disorder Prior Outpatient Therapy: Prior Outpatient Therapy:  [facility])  Past Medical History:  Past Medical History:  Diagnosis Date  . Anxiety   . Anxiety disorder due to known physiological condition    HOSPITALIZED 10/18  . Arthritis   . Autistic disorder, residual state   . COPD (chronic obstructive pulmonary disease) ([place])   . Depression   . Developmental disorder   . Diabetes mellitus without complication ([place])   . Dyslipidemia   . Esophageal reflux   . HOH (hard of hearing)    MILDLY  . Hypertension   . Obesity   . Overactive bladder   .  Palpitations    ANXIETY  . Schizophrenia, schizoaffective ([place])   . Sleep apnea   . Tremors of nervous system    HANDS DUE TO MEDICATIONS  . Urinary incontinence     Past Surgical History:  Procedure Laterality Date  . CATARACT EXTRACTION W/PHACO Left 11/14/2017   Procedure: CATARACT EXTRACTION PHACO  AND INTRAOCULAR LENS PLACEMENT (IOC);  Surgeon: Birder Robson, MD;  Location: [facility] ORS;  Service: Ophthalmology;  Laterality: Left;  [place] 00:42 AP% 14.6 CDE 6.12 Fluid pack lot # [id number]  . COLONOSCOPY  01/28/2005  . COLONOSCOPY WITH PROPOFOL N/A 05/09/2016   Procedure: COLONOSCOPY WITH PROPOFOL;  Surgeon: Manya Silvas, MD;  Location: [facility] ENDOSCOPY;  Service: Endoscopy;  Laterality: N/A;  . ESOPHAGOGASTRODUODENOSCOPY N/A 05/09/2016   Procedure: ESOPHAGOGASTRODUODENOSCOPY (EGD);  Surgeon: Manya Silvas, MD;  Location: [facility] ENDOSCOPY;  Service: Endoscopy;  Laterality: N/A;  . FRACTURE SURGERY     ORIF SHOULDER  . TONSILLECTOMY     Family History:  Family History  Problem Relation Age of Onset  . Hypertension Mother   . Stroke Father   . Heart Problems Father    Family Psychiatric  History: None Social History:  Social History   Substance and Sexual Activity  Alcohol Use No  . Alcohol/week: 0.0 standard drinks     Social History   Substance and Sexual Activity  Drug Use No    Social History   Socioeconomic History  . Marital status: Single    Spouse name: Not on file  . Number of children: Not on file  . Years of education: Not on file  . Highest education level: Not on file  Occupational History  . Not on file  Tobacco Use  . Smoking status: Never Smoker  . Smokeless tobacco: Never Used  Vaping Use  . Vaping Use: Never used  Substance and Sexual Activity  . Alcohol use: No    Alcohol/week: 0.0 standard drinks  . Drug use: No  . Sexual activity: Never  Other Topics Concern  . Not on file  Social History Narrative   The patient never finished high school but did get his GED. 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 He is currently in disability and [name] is his legal guardian. He has been living in group homes for many years.      No pending legal charges   Social Determinants  of Health   Financial Resource Strain:   . Difficulty of Paying Living Expenses: Not on file  Food Insecurity:   . Worried About Charity fundraiser in the Last Year: Not on file  . Ran Out of Food in the Last Year: Not on file  Transportation Needs:   . Lack of Transportation (Medical): Not on file  . Lack of Transportation (Non-Medical): Not on file  Physical Activity:   . Days of Exercise per Week: Not on file  . Minutes of Exercise per Session: Not on file  Stress:   . Feeling of Stress : Not on file  Social Connections:   . Frequency of Communication with Friends and Family: Not on file  . Frequency of Social Gatherings with Friends and Family: Not on file  . Attends Religious Services: Not on file  . Active Member of Clubs or Organizations: Not on file  . Attends Archivist Meetings: Not on file  . Marital Status: Not on file  Additional Social History:    Allergies:   Allergies  Allergen Reactions  . Other Other (See Comments)    MYACINS  . Penicillins Nausea And Vomiting  . Cortizone-10 [Hydrocortisone] Rash    Labs:  Results for orders placed or performed during the hospital encounter of 06/09/20 (from the past 48 hour(s))  Comprehensive metabolic panel     Status: Abnormal   Collection Time: 06/09/20  6:42 PM  Result Value Ref Range   Sodium 141 135 - 145 mmol/L   Potassium 4.1 3.5 - 5.1 mmol/L   Chloride 103 98 - 111 mmol/L   CO2 26 22 - 32 mmol/L   Glucose, Bld 120 (H) 70 - 99 mg/dL    Comment: Glucose reference range applies only to samples taken after fasting for at least 8 hours.   BUN 21 8 - 23 mg/dL   Creatinine, Ser 1.01 0.61 - 1.24 mg/dL   Calcium 9.4 8.9 - 10.3 mg/dL   Total Protein 7.7 6.5 - 8.1 g/dL   Albumin 3.9 3.5 - 5.0 g/dL   AST 21 15 - 41 U/L   ALT 8 0 - 44 U/L   Alkaline Phosphatase 64 38 - 126 U/L   Total Bilirubin 0.8 0.3 - 1.2 mg/dL   GFR, Estimated >60 >60 mL/min    Comment: (NOTE) Calculated using the CKD-EPI  Creatinine Equation (2021)    Anion gap 12 5 - 15    Comment: Performed at [facility], [address] [place], [place] [address]  Ethanol     Status: None   Collection Time: 06/09/20  6:42 PM  Result Value Ref Range   Alcohol, Ethyl (B) <10 <10 mg/dL    Comment: (NOTE) Lowest detectable limit for serum alcohol is 10 mg/dL.  For medical purposes only. Performed at [facility], [address] [place], [place] [address]   Salicylate level     Status: Abnormal   Collection Time: 06/09/20  6:42 PM  Result Value Ref Range   Salicylate Lvl <0.8 (L) 7.0 - 30.0 mg/dL    Comment: Performed at [facility], [address] [place], [place] [address]  Acetaminophen level     Status: Abnormal   Collection Time: 06/09/20  6:42 PM  Result Value Ref Range   Acetaminophen (Tylenol), Serum <10 (L) 10 - 30 ug/mL    Comment: (NOTE) Therapeutic concentrations vary significantly. A range of 10-30 ug/mL  may be an effective concentration for many patients. However, some  are best treated at concentrations outside of this range. Acetaminophen concentrations >150 ug/mL at 4 hours after ingestion  and >50 ug/mL at 12 hours after ingestion are often associated with  toxic reactions.  Performed at [facility], [address] [place], [place] [address]   cbc     Status: None   Collection Time: 06/09/20  6:42 PM  Result Value Ref Range   WBC 9.1 4.0 - 10.5 K/uL   RBC 4.91 4.22 - 5.81 MIL/uL   Hemoglobin 15.2 13.0 - 17.0 g/dL   HCT 45.8 39 - 52 %   MCV 93.3 80.0 - 100.0 fL   MCH 31.0 26.0 - 34.0 pg   MCHC 33.2 30.0 - 36.0 g/dL   RDW 12.3 11.5 - 15.5 %   Platelets 200 150 - 400 K/uL   nRBC 0.0 0.0 - 0.2 %    Comment: Performed at [facility], [address], [place], [place] [address]  Resp Panel by RT-PCR (Flu  A&B, Covid) Nasopharyngeal Swab     Status: None   Collection Time: 06/10/20  7:19 AM   Specimen: Nasopharyngeal Swab;  Nasopharyngeal(NP) swabs in vial transport medium  Result Value Ref Range   SARS Coronavirus 2 by RT PCR NEGATIVE NEGATIVE    Comment: (NOTE) SARS-CoV-2 target nucleic acids are NOT DETECTED.  The SARS-CoV-2 RNA is generally detectable in upper respiratory specimens during the acute phase of infection. The lowest concentration of SARS-CoV-2 viral copies this assay can detect is 138 copies/mL. A negative result does not preclude SARS-Cov-2 infection and should not be used as the sole basis for treatment or other patient management decisions. A negative result may occur with  improper specimen collection/handling, submission of specimen other than nasopharyngeal swab, presence of viral mutation(s) within the areas targeted by this assay, and inadequate number of viral copies(<138 copies/mL). A negative result must be combined with clinical observations, patient history, and epidemiological information. The expected result is Negative.  Fact Sheet for Patients:  EntrepreneurPulse.com.au  Fact Sheet for Healthcare Providers:  IncredibleEmployment.be  This test is no t yet approved or cleared by the [place] FDA and  has been authorized for detection and/or diagnosis of SARS-CoV-2 by FDA under an Emergency Use Authorization (EUA). This EUA will remain  in effect (meaning this test can be used) for the duration of the COVID-19 declaration under Section 564(b)(1) of the Act, 21 U.S.C.section 360bbb-3(b)(1), unless the authorization is terminated  or revoked sooner.       Influenza A by PCR NEGATIVE NEGATIVE   Influenza B by PCR NEGATIVE NEGATIVE    Comment: (NOTE) The Xpert Xpress SARS-CoV-2/FLU/RSV plus assay is intended as an aid in the diagnosis of influenza from Nasopharyngeal swab specimens and should not be used as a sole basis for treatment. Nasal washings and aspirates are unacceptable for Xpert Xpress  SARS-CoV-2/FLU/RSV testing.  Fact Sheet for Patients: EntrepreneurPulse.com.au  Fact Sheet for Healthcare Providers: IncredibleEmployment.be  This test is not yet approved or cleared by the [place] FDA and has been authorized for detection and/or diagnosis of SARS-CoV-2 by FDA under an Emergency Use Authorization (EUA). This EUA will remain in effect (meaning this test can be used) for the duration of the COVID-19 declaration under Section 564(b)(1) of the Act, 21 U.S.C. section 360bbb-3(b)(1), unless the authorization is terminated or revoked.  Performed at [facility], [address] [place], [place] [address]     Current Facility-Administered Medications  Medication Dose Route Frequency Provider Last Rate Last Admin  . aspirin EC tablet 81 mg  81 mg Oral Daily Ashonte Angelucci T, MD      . clonazePAM (KLONOPIN) tablet 1 mg  1 mg Oral QID Tinzlee Craker T, MD      . divalproex (DEPAKOTE ER) 24 hr tablet 1,500 mg  1,500 mg Oral QHS Latoya Maulding T, MD      . gabapentin (NEURONTIN) capsule 100 mg  100 mg Oral TID Tiah Heckel T, MD      . hydrOXYzine (ATARAX/VISTARIL) tablet 10 mg  10 mg Oral Q6H PRN Develle Sievers T, MD      . lamoTRIgine (LAMICTAL) tablet 150 mg  150 mg Oral QHS Everardo Voris T, MD      . linagliptin (TRADJENTA) tablet 5 mg  5 mg Oral Daily Karmyn Lowman T, MD      . loratadine (CLARITIN) tablet 10 mg  10 mg Oral Daily Marra Fraga, [name], MD      . LORazepam (ATIVAN)  tablet 2 mg  2 mg Oral NOW Jmarion Christiano T, MD      . LORazepam (ATIVAN) tablet 2 mg  2 mg Oral Q6H PRN Keeon Zurn T, MD      . metFORMIN (GLUCOPHAGE) tablet 850 mg  850 mg Oral BID WC Kaelin Bonelli T, MD      . metoprolol succinate (TOPROL-XL) 24 hr tablet 50 mg  50 mg Oral Daily Rachit Grim T, MD      . oxybutynin (DITROPAN) tablet 10 mg  10 mg Oral BID Osiah Haring T, MD      . pantoprazole (PROTONIX) EC tablet 40 mg  40 mg Oral Daily Daishia Fetterly,  Cylus Douville T, MD      . prazosin (MINIPRESS) capsule 1 mg  1 mg Oral QHS Oiva Dibari T, MD      . QUEtiapine (SEROQUEL) tablet 100 mg  100 mg Oral QID Ceira Hoeschen T, MD      . QUEtiapine (SEROQUEL) tablet 400 mg  400 mg Oral QHS Arden Tinoco T, MD      . sertraline (ZOLOFT) tablet 50 mg  50 mg Oral BID Shaasia Odle T, MD      . simethicone (MYLICON) chewable tablet 80 mg  80 mg Oral QID Rosabel Sermeno T, MD      . simvastatin (ZOCOR) tablet 20 mg  20 mg Oral q1800 Gracelyn Coventry T, MD      . tamsulosin (FLOMAX) capsule 0.8 mg  0.8 mg Oral QPC supper Janyth Riera, [name], MD       Current Outpatient Medications  Medication Sig Dispense Refill  . acetaminophen (TYLENOL) 650 MG CR tablet Take 650 mg by mouth every 12 (twelve) hours as needed for pain.    Marland Kitchen aspirin EC 81 MG EC tablet Take 1 tablet (81 mg total) by mouth daily. Swallow whole. 90 tablet 1  . clonazePAM (KLONOPIN) 1 MG tablet Take 1 tablet (1 mg total) by mouth 4 (four) times daily. 360 tablet 1  . divalproex (DEPAKOTE ER) 500 MG 24 hr tablet Take 3 tablets (1,500 mg total) by mouth at bedtime. 270 tablet 1  . gabapentin (NEURONTIN) 100 MG capsule Take 1 capsule (100 mg total) by mouth 3 (three) times daily. 270 capsule 1  . hydrOXYzine (ATARAX/VISTARIL) 10 MG tablet Take 1 tablet (10 mg total) by mouth 3 (three) times daily as needed for anxiety. 270 tablet 1  . lamoTRIgine (LAMICTAL) 150 MG tablet Take 1 tablet (150 mg total) by mouth at bedtime. 90 tablet 1  . linagliptin (TRADJENTA) 5 MG TABS tablet Take 1 tablet (5 mg total) by mouth daily. 90 tablet 1  . loratadine (CLARITIN) 10 MG tablet Take 1 tablet (10 mg total) by mouth daily. 90 tablet 1  . LORazepam (ATIVAN) 2 MG tablet Take 1 tablet (2 mg total) by mouth every 6 (six) hours as needed (severe anxiety). 180 tablet 1  . metFORMIN (GLUCOPHAGE) 850 MG tablet Take 1 tablet (850 mg total) by mouth 2 (two) times daily with a meal. 180 tablet 1  . metoprolol succinate (TOPROL-XL) 50 MG  24 hr tablet Take 1 tablet (50 mg total) by mouth daily. 90 tablet 1  . oxybutynin (DITROPAN) 5 MG tablet Take 2 tablets (10 mg total) by mouth 2 (two) times daily. 180 tablet 1  . pantoprazole (PROTONIX) 40 MG tablet Take 1 tablet (40 mg total) by mouth daily. 90 tablet 1  . prazosin (MINIPRESS) 1 MG capsule Take 1 capsule (1 mg total)  by mouth at bedtime. 90 capsule 1  . QUEtiapine (SEROQUEL) 100 MG tablet Take 1 tablet (100 mg total) by mouth 4 (four) times daily. 360 tablet 1  . QUEtiapine (SEROQUEL) 400 MG tablet Take 1 tablet (400 mg total) by mouth at bedtime. 90 tablet 1  . sertraline (ZOLOFT) 50 MG tablet Take 1 tablet (50 mg total) by mouth 2 (two) times daily. 180 tablet 1  . simethicone (MYLICON) 80 MG chewable tablet Chew 1 tablet (80 mg total) by mouth 3 (three) times daily. 270 tablet 1  . simvastatin (ZOCOR) 20 MG tablet Take 1 tablet (20 mg total) by mouth daily at 6 PM. 90 tablet 1  . tamsulosin (FLOMAX) 0.4 MG CAPS capsule Take 2 capsules (0.8 mg total) by mouth daily after supper. 90 capsule 1    Musculoskeletal: Strength & Muscle Tone: within normal limits Gait & Station: normal Patient leans: N/A  Psychiatric Specialty Exam: Physical Exam Vitals and nursing note reviewed.  Constitutional:      Appearance: He is well-developed.  HENT:     Head: Normocephalic and atraumatic.  Eyes:     Conjunctiva/sclera: Conjunctivae normal.     Pupils: Pupils are equal, round, and reactive to light.  Cardiovascular:     Heart sounds: Normal heart sounds.  Pulmonary:     Effort: Pulmonary effort is normal.  Abdominal:     Palpations: Abdomen is soft.  Musculoskeletal:        General: Normal range of motion.     Cervical back: Normal range of motion.  Skin:    General: Skin is warm and dry.  Neurological:     General: No focal deficit present.     Mental Status: He is alert.  Psychiatric:        Attention and Perception: He is inattentive.        Mood and Affect: Affect  is labile and inappropriate.        Speech: Speech is rapid and pressured and tangential.        Behavior: Behavior is agitated.        Thought Content: Thought content does not include homicidal or suicidal ideation.        Cognition and Memory: Cognition is impaired. Memory is impaired.        Judgment: Judgment is impulsive and inappropriate.     Review of Systems  Constitutional: Negative.   HENT: Negative.   Eyes: Negative.   Respiratory: Negative.   Cardiovascular: Negative.   Gastrointestinal: Negative.   Musculoskeletal: Negative.   Skin: Negative.   Neurological: Negative.   Psychiatric/Behavioral: Positive for dysphoric mood. Negative for suicidal ideas. The patient is nervous/anxious.     Blood pressure (!) 137/110, pulse (!) 56, temperature 98.7 F (37.1 C), temperature source Oral, resp. rate 19, height 5\' 8"  (1.727 m), weight 104.3 kg, SpO2 96 %.Body mass index is 34.96 kg/m.  General Appearance: Disheveled  Eye Contact:  Minimal  Speech:  Pressured  Volume:  Increased  Mood:  Angry, Dysphoric and Irritable  Affect:  Inappropriate, Labile and Tearful  Thought Process:  Disorganized  Orientation:  Full (Time, Place, and Person)  Thought Content:  Illogical  Suicidal Thoughts:  No  Homicidal Thoughts:  No  Memory:  Immediate;   Fair Recent;   Poor Remote;   Fair  Judgement:  Impaired  Insight:  Shallow  Psychomotor Activity:  Restlessness  Concentration:  Concentration: Poor  Recall:  Poor  Fund of Knowledge:  Poor  Language:  Fair  Akathisia:  No  Handed:  Right  AIMS (if indicated):     Assets:  Financial Resources/Insurance Social Support  ADL's:  Impaired  Cognition:  Impaired,  Mild  Sleep:        Treatment Plan Summary: Daily contact with patient to assess and evaluate symptoms and progress in treatment, Medication management and Plan Patient with intellectual disability autistic spectrum disorder.  He returns to the emergency room  unfortunately with exactly the same behavior that got him thrown out of his last group home and the 1 before it.  Patient is currently agitated and cannot engage in any kind of rational dialogue.  After providing a lot of support and empathy I tried to gently point out to him that there were really only 3 options.  He could either be readmitted to the psychiatric ward in which case who knows how long he would be there, he could stay in the emergency room for an indefinite time that could be measured in months while we look for a new placement or he could decide that he was willing to give the new group home to try and to use his coping skills and go back there.  All of this of course just resulted in him screaming and throwing more of a tantrum and he could not engage with this thought.  I have given him some acute medication and restarted his outpatient medicine.  We will try to have a more lucid conversation later.  We will be in contact with the group home to see if they would be willing to take him back again.  Otherwise he is going to be in extreme placement problem again.  Disposition: Supportive therapy provided about ongoing stressors. Discussed crisis plan, support from social network, calling 911, coming to the Emergency Department, and calling Suicide Hotline.  Alethia Berthold, MD 06/10/2020 11:23 AM

## 2020-06-10 NOTE — ED Notes (Signed)
Hourly rounding completed at this time, patient currently awake in room. No complaints, stable, and in no acute distress. Q15 minute rounds and monitoring via Rover and Officer to continue. °

## 2020-06-10 NOTE — BH Assessment (Signed)
Writer called and left a HIPPA Compliant message with Group Home ([phone]), requesting a return phone call at 16:31 and 17:28.

## 2020-06-10 NOTE — ED Notes (Signed)
Given food tray with juice.

## 2020-06-10 NOTE — [facility] Counselor (Signed)
CSW returned call from patient's brother [name], [phone].  Pt's brother had called CSW while CSW was in a meeting.   Brother updated CSW on pt being in the ED and the circumstances leading to placement.  Brother stated several times "he may end up back with you" and "I'll be talking to you soon".  CSW encouraged brother to follow up with the ED staff at this time as CSW is not currently working with patient and not entirely up to date on care.  Brother reports that he was calling to have the number to patient's new group home, however, has since obtained it.  Assunta Curtis, MSW, LCSW 06/10/2020 11:56 AM

## 2020-06-10 NOTE — ED Notes (Signed)
Pt asleep at this time, unable to collect vitals. Will collect pt vitals once awake. 

## 2020-06-10 NOTE — Progress Notes (Signed)
Patient ID: Alexander Beard, male   DOB: [birth year], 64 y.o.   MRN: [id number] Updated note.  Patient has received medication.  Although he still remains emotionally distressed intermittently he has not been violent or aggressive.  Patient has been taken off of involuntary commitment as not really meeting criteria and not requiring inpatient hospitalization.  We will be working with his group home to try to arrange an agreement for him to return there at this time.

## 2020-06-10 NOTE — ED Notes (Signed)
Pt tearful and concerned due to pt that has been yelling in other room through entirety of day shift. Pt is irritable and wants to confront this pt. Pt updated on process and plan of care and reassured that pt is not upset due to him

## 2020-06-10 NOTE — Consult Note (Signed)
[facility] Consult   Reason for Consult: Psychiatric Evaluation Referring Physician: Dr. Cinda Quest Patient Identification: Alexander Beard MRN:  [id number] Principal Diagnosis: <principal problem not specified> Diagnosis:  Active Problems:   Schizoaffective disorder, bipolar type ([place])   Active autistic disorder   Schizoaffective disorder ([place])   Intellectual disability   At risk for elopement   Total Time spent with patient: 20 minutes  Subjective:  "I just need to get my thoughts together."  Alexander Beard is a 64 y.o. male patient presented to [facility] ED via [place] under involuntary commitment status (IVC).  Per the triage nurse note, Pt to ED via [place]. Per IVC papers pt left his care home and was found walking down the road barefoot by law enforcement. Per IVC papers pt attempted to run from law enforcement when approached. Pt arrives to ED very tearful, states he is unhappy with his current living situation because he wants to live with "younger, fun people" and he wants his "authority" back. Pt able to be redirected in triage, tearful but cooperative in triage with staff.  Pt endorses thoughts of wanting to hurt himself, pt states "all the time, but I never go through with it".  The patient was seen face-to-face by this provider; the chart was reviewed and consulted with Dr. Cinda Quest on 06/09/2020 due to the patient's care. It was discussed with the EDP that the patient will remain under observation overnight and will be reassessed in the a.m. to determine if he meets the criteria for psychiatric inpatient admission; he could be discharged back home. On evaluation, the patient is alert and oriented x4, anxious, bizarre, depressed, and mood-congruent with affect.  The patient was asked what brought him to the hospital?  He began shaking, crying, stating, "let me get my thoughts together.  Please, let me get my thoughts together."  The patient was rocking back and forth.  Rubbing his  hands together.  He maintains a repetitive back-and-forth movement, whining/crying and stating, "Putting my thoughts together." The patient does not appear to be responding to internal or external stimuli. The patient does present with some delusional thinking and abnormal repetitive movement. The patient denies auditory or visual hallucinations. HPI: Per Dr. Cinda Quest; Alexander Beard is a 64 y.o. male who was discharged from psychiatry yesterday with anxiety confusion agitation and mood instability schizoaffective disorder bipolar type I to his group home.  He ran away from his group home was found by the police walking down the street barefoot.  He was yelling and screaming about how no one understands him.  He then ran away from the police.  He was brought here is very tearful and somewhat agitated.  He has mentioned suicidal ideation today.  Past Psychiatric History:  Anxiety   Anxiety disorder due to known physiological condition   HOSPITALIZED 10/18   Autistic disorder, residual state   Depression   Developmental disorder   Schizophrenia, schizoaffective (HCC)   Risk to Self: Suicidal Ideation: No-Not Currently/Within Last 6 Months Suicidal Intent: No-Not Currently/Within Last 6 Months Is patient at risk for suicide?: No Suicidal Plan?: No Access to Means: No What has been your use of drugs/alcohol within the last 12 months?: None How many times?:  (UTA) Other Self Harm Risks: None Triggers for Past Attempts: None known Intentional Self Injurious Behavior: None Risk to Others: Homicidal Ideation: No Thoughts of Harm to Others: No Current Homicidal Intent: No Current Homicidal Plan: No Access to Homicidal Means: No Identified Victim: NOne  History of harm to others?: No Assessment of Violence: None Noted Violent Behavior Description: None Does patient have access to weapons?: No Criminal Charges Pending?: No Does patient have a court date: No Prior Inpatient Therapy:  Prior Inpatient Therapy: Yes Prior Therapy Dates: 02/2020 Prior Therapy Facilty/Provider(s): [facility] Reason for Treatment: Schizoaffective Disorder Prior Outpatient Therapy: Prior Outpatient Therapy:  [facility])  Past Medical History:  Past Medical History:  Diagnosis Date  . Anxiety   . Anxiety disorder due to known physiological condition    HOSPITALIZED 10/18  . Arthritis   . Autistic disorder, residual state   . COPD (chronic obstructive pulmonary disease) ([place])   . Depression   . Developmental disorder   . Diabetes mellitus without complication ([place])   . Dyslipidemia   . Esophageal reflux   . HOH (hard of hearing)    MILDLY  . Hypertension   . Obesity   . Overactive bladder   . Palpitations    ANXIETY  . Schizophrenia, schizoaffective ([place])   . Sleep apnea   . Tremors of nervous system    HANDS DUE TO MEDICATIONS  . Urinary incontinence     Past Surgical History:  Procedure Laterality Date  . CATARACT EXTRACTION W/PHACO Left 11/14/2017   Procedure: CATARACT EXTRACTION PHACO AND INTRAOCULAR LENS PLACEMENT (IOC);  Surgeon: Birder Robson, MD;  Location: [facility] ORS;  Service: Ophthalmology;  Laterality: Left;  [place] 00:42 AP% 14.6 CDE 6.12 Fluid pack lot # [id number]  . COLONOSCOPY  01/28/2005  . COLONOSCOPY WITH PROPOFOL N/A 05/09/2016   Procedure: COLONOSCOPY WITH PROPOFOL;  Surgeon: Manya Silvas, MD;  Location: [facility] ENDOSCOPY;  Service: Endoscopy;  Laterality: N/A;  . ESOPHAGOGASTRODUODENOSCOPY N/A 05/09/2016   Procedure: ESOPHAGOGASTRODUODENOSCOPY (EGD);  Surgeon: Manya Silvas, MD;  Location: [facility] ENDOSCOPY;  Service: Endoscopy;  Laterality: N/A;  . FRACTURE SURGERY     ORIF SHOULDER  . TONSILLECTOMY     Family History:  Family History  Problem Relation Age of Onset  . Hypertension Mother   . Stroke Father   . Heart Problems Father    Family Psychiatric  History:  Social History:  Social History   Substance and Sexual Activity  Alcohol Use No  .  Alcohol/week: 0.0 standard drinks     Social History   Substance and Sexual Activity  Drug Use No    Social History   Socioeconomic History  . Marital status: Single    Spouse name: Not on file  . Number of children: Not on file  . Years of education: Not on file  . Highest education level: Not on file  Occupational History  . Not on file  Tobacco Use  . Smoking status: Never Smoker  . Smokeless tobacco: Never Used  Vaping Use  . Vaping Use: Never used  Substance and Sexual Activity  . Alcohol use: No    Alcohol/week: 0.0 standard drinks  . Drug use: No  . Sexual activity: Never  Other Topics Concern  . Not on file  Social History Narrative   The patient never finished high school but did get his GED. 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 He is currently in disability and [name] is his legal guardian. He has been living in group homes for many years.      No pending legal charges   Social Determinants of Health   Financial Resource Strain:   . Difficulty of Paying  Living Expenses: Not on file  Food Insecurity:   . Worried About Charity fundraiser in the Last Year: Not on file  . Ran Out of Food in the Last Year: Not on file  Transportation Needs:   . Lack of Transportation (Medical): Not on file  . Lack of Transportation (Non-Medical): Not on file  Physical Activity:   . Days of Exercise per Week: Not on file  . Minutes of Exercise per Session: Not on file  Stress:   . Feeling of Stress : Not on file  Social Connections:   . Frequency of Communication with Friends and Family: Not on file  . Frequency of Social Gatherings with Friends and Family: Not on file  . Attends Religious Services: Not on file  . Active Member of Clubs or Organizations: Not on file  . Attends Archivist Meetings: Not on file  . Marital Status: Not on file   Additional Social History:    Allergies:    Allergies  Allergen Reactions  . Other Other (See Comments)    MYACINS  . Penicillins Nausea And Vomiting  . Cortizone-10 [Hydrocortisone] Rash    Labs:  Results for orders placed or performed during the hospital encounter of 06/09/20 (from the past 48 hour(s))  Comprehensive metabolic panel     Status: Abnormal   Collection Time: 06/09/20  6:42 PM  Result Value Ref Range   Sodium 141 135 - 145 mmol/L   Potassium 4.1 3.5 - 5.1 mmol/L   Chloride 103 98 - 111 mmol/L   CO2 26 22 - 32 mmol/L   Glucose, Bld 120 (H) 70 - 99 mg/dL    Comment: Glucose reference range applies only to samples taken after fasting for at least 8 hours.   BUN 21 8 - 23 mg/dL   Creatinine, Ser 1.01 0.61 - 1.24 mg/dL   Calcium 9.4 8.9 - 10.3 mg/dL   Total Protein 7.7 6.5 - 8.1 g/dL   Albumin 3.9 3.5 - 5.0 g/dL   AST 21 15 - 41 U/L   ALT 8 0 - 44 U/L   Alkaline Phosphatase 64 38 - 126 U/L   Total Bilirubin 0.8 0.3 - 1.2 mg/dL   GFR, Estimated >60 >60 mL/min    Comment: (NOTE) Calculated using the CKD-EPI Creatinine Equation (2021)    Anion gap 12 5 - 15    Comment: Performed at [facility], [address] [place], [place] [address]  Ethanol     Status: None   Collection Time: 06/09/20  6:42 PM  Result Value Ref Range   Alcohol, Ethyl (B) <10 <10 mg/dL    Comment: (NOTE) Lowest detectable limit for serum alcohol is 10 mg/dL.  For medical purposes only. Performed at [facility], [address] [place], [place] [address]   Salicylate level     Status: Abnormal   Collection Time: 06/09/20  6:42 PM  Result Value Ref Range   Salicylate Lvl <2.1 (L) 7.0 - 30.0 mg/dL    Comment: Performed at [facility], [address] [place], [place] [address]  Acetaminophen level     Status: Abnormal   Collection Time: 06/09/20  6:42 PM  Result Value Ref Range   Acetaminophen (Tylenol), Serum <10 (L) 10 - 30 ug/mL    Comment: (NOTE) Therapeutic concentrations vary significantly.  A range of 10-30 ug/mL  may be an effective concentration for many patients. However, some  are best treated at concentrations outside of this  range. Acetaminophen concentrations >150 ug/mL at 4 hours after ingestion  and >50 ug/mL at 12 hours after ingestion are often associated with  toxic reactions.  Performed at [facility], [address] [place], [place] [address]   cbc     Status: None   Collection Time: 06/09/20  6:42 PM  Result Value Ref Range   WBC 9.1 4.0 - 10.5 K/uL   RBC 4.91 4.22 - 5.81 MIL/uL   Hemoglobin 15.2 13.0 - 17.0 g/dL   HCT 45.8 39 - 52 %   MCV 93.3 80.0 - 100.0 fL   MCH 31.0 26.0 - 34.0 pg   MCHC 33.2 30.0 - 36.0 g/dL   RDW 12.3 11.5 - 15.5 %   Platelets 200 150 - 400 K/uL   nRBC 0.0 0.0 - 0.2 %    Comment: Performed at [facility], [address] [place], [place] [address]    No current facility-administered medications for this encounter.   Current Outpatient Medications  Medication Sig Dispense Refill  . acetaminophen (TYLENOL) 650 MG CR tablet Take 650 mg by mouth every 12 (twelve) hours as needed for pain.    Marland Kitchen aspirin EC 81 MG EC tablet Take 1 tablet (81 mg total) by mouth daily. Swallow whole. 90 tablet 1  . clonazePAM (KLONOPIN) 1 MG tablet Take 1 tablet (1 mg total) by mouth 4 (four) times daily. 360 tablet 1  . divalproex (DEPAKOTE ER) 500 MG 24 hr tablet Take 3 tablets (1,500 mg total) by mouth at bedtime. 270 tablet 1  . gabapentin (NEURONTIN) 100 MG capsule Take 1 capsule (100 mg total) by mouth 3 (three) times daily. 270 capsule 1  . hydrOXYzine (ATARAX/VISTARIL) 10 MG tablet Take 1 tablet (10 mg total) by mouth 3 (three) times daily as needed for anxiety. 270 tablet 1  . lamoTRIgine (LAMICTAL) 150 MG tablet Take 1 tablet (150 mg total) by mouth at bedtime. 90 tablet 1  . linagliptin (TRADJENTA) 5 MG TABS tablet Take 1 tablet (5 mg total) by mouth daily. 90 tablet 1  . loratadine (CLARITIN) 10 MG tablet Take 1 tablet  (10 mg total) by mouth daily. 90 tablet 1  . LORazepam (ATIVAN) 2 MG tablet Take 1 tablet (2 mg total) by mouth every 6 (six) hours as needed (severe anxiety). 180 tablet 1  . metFORMIN (GLUCOPHAGE) 850 MG tablet Take 1 tablet (850 mg total) by mouth 2 (two) times daily with a meal. 180 tablet 1  . metoprolol succinate (TOPROL-XL) 50 MG 24 hr tablet Take 1 tablet (50 mg total) by mouth daily. 90 tablet 1  . oxybutynin (DITROPAN) 5 MG tablet Take 2 tablets (10 mg total) by mouth 2 (two) times daily. 180 tablet 1  . pantoprazole (PROTONIX) 40 MG tablet Take 1 tablet (40 mg total) by mouth daily. 90 tablet 1  . prazosin (MINIPRESS) 1 MG capsule Take 1 capsule (1 mg total) by mouth at bedtime. 90 capsule 1  . QUEtiapine (SEROQUEL) 100 MG tablet Take 1 tablet (100 mg total) by mouth 4 (four) times daily. 360 tablet 1  . QUEtiapine (SEROQUEL) 400 MG tablet Take 1 tablet (400 mg total) by mouth at bedtime. 90 tablet 1  . sertraline (ZOLOFT) 50 MG tablet Take 1 tablet (50 mg total) by mouth 2 (two) times daily. 180 tablet 1  . simethicone (MYLICON) 80 MG chewable tablet Chew 1 tablet (80 mg total) by mouth 3 (three) times daily. 270 tablet 1  . simvastatin (ZOCOR)  20 MG tablet Take 1 tablet (20 mg total) by mouth daily at 6 PM. 90 tablet 1  . tamsulosin (FLOMAX) 0.4 MG CAPS capsule Take 2 capsules (0.8 mg total) by mouth daily after supper. 90 capsule 1    Musculoskeletal: Strength & Muscle Tone: within normal limits Gait & Station: normal Patient leans: N/A  Psychiatric Specialty Exam: Physical Exam Vitals and nursing note reviewed.  HENT:     Right Ear: External ear normal.     Left Ear: External ear normal.     Nose: Nose normal.     Mouth/Throat:     Mouth: Mucous membranes are moist.  Cardiovascular:     Rate and Rhythm: Normal rate.     Pulses: Normal pulses.  Pulmonary:     Effort: Pulmonary effort is normal.  Musculoskeletal:        General: Normal range of motion.     Cervical  back: Normal range of motion and neck supple.  Neurological:     General: No focal deficit present.     Mental Status: He is alert and oriented to person, place, and time. Mental status is at baseline.  Psychiatric:        Attention and Perception: He is inattentive.        Mood and Affect: Mood is anxious and depressed.        Speech: Speech is delayed.        Behavior: Behavior is uncooperative.        Thought Content: Thought content is delusional.        Cognition and Memory: Cognition and memory normal.        Judgment: Judgment is inappropriate.     Review of Systems  Psychiatric/Behavioral: Positive for agitation. The patient is nervous/anxious.   All other systems reviewed and are negative.   Blood pressure (!) 135/97, pulse 75, temperature 99.7 F (37.6 C), temperature source Oral, resp. rate 20, height 5\' 8"  (1.727 m), weight 104.3 kg, SpO2 98 %.Body mass index is 34.96 kg/m.  General Appearance: Bizarre and Disheveled  Eye Contact:  Minimal  Speech:  Blocked and Garbled  Volume:  Normal  Mood:  Anxious, Depressed and Irritable  Affect:  Blunt, Congruent, Constricted, Depressed and Inappropriate  Thought Process:  Coherent  Orientation:  Full (Time, Place, and Person)  Thought Content:  Illogical and Paranoid Ideation  Suicidal Thoughts:  No  Homicidal Thoughts:  No  Memory:  Immediate;   Fair Recent;   Fair Remote;   Fair  Judgement:  Poor  Insight:  Lacking  Psychomotor Activity:  Normal  Concentration:  Concentration: Poor and Attention Span: Poor  Recall:  Poor  Fund of Knowledge:  Poor  Language:  Poor  Akathisia:  Negative  Handed:  Right  AIMS (if indicated):     Assets:  Communication Skills Desire for Improvement Housing Resilience Social Support  ADL's:  Intact  Cognition:  WNL  Sleep:        Treatment Plan Summary: Daily contact with patient to assess and evaluate symptoms and progress in treatment, Medication management and Plan The  patient remained under observation overnight and will be reassessed in the a.m. to determine if he meets the criteria for psychiatric inpatient admission; he could be discharged back home.  Disposition: No evidence of imminent risk to self or others at present.   The patient remained under observation overnight and will be reassessed in the a.m. to determine if he meets the criteria for psychiatric  inpatient admission; he could be discharged back home.  [name], NP 06/10/2020 12:01 AM

## 2020-06-10 NOTE — ED Notes (Signed)
VOL, papers rescinded pending placement

## 2020-06-10 NOTE — ED Notes (Signed)
Pt crying, hollering out. Into pts room. Pt with entire body tensed stating that he had turned away from god, that there was no hope for him, he had turned his back on jesus. Pt continued on to how he loves someone and she does not love him. Pt also is hearing bits of conversations from hall, fills in the blanks and thinks that the conversations are about him. I spent 15 minutes trying to console pt and he repeated the above over and over working himself up.

## 2020-06-10 NOTE — ED Notes (Signed)
Report received from [facility] including Situation, Background, Assessment, and Recommendations. Patient alert and oriented, warm and dry, and in no acute distress. Patient denies HI, AVH and pain. Patient made aware of Q15 minute rounds and Engineer, drilling presence for their safety. Patient instructed to come to this nurse with needs or concerns.

## 2020-06-11 DIAGNOSIS — F259 Schizoaffective disorder, unspecified: Secondary | ICD-10-CM | POA: Diagnosis not present

## 2020-06-11 DIAGNOSIS — F840 Autistic disorder: Secondary | ICD-10-CM | POA: Diagnosis not present

## 2020-06-11 MED ORDER — LORAZEPAM 2 MG/ML IJ SOLN
2.0000 mg | Freq: Once | INTRAMUSCULAR | Status: AC
  Administered 2020-06-11: 2 mg via INTRAMUSCULAR

## 2020-06-11 MED ORDER — LORAZEPAM 2 MG/ML IJ SOLN
INTRAMUSCULAR | Status: AC
  Filled 2020-06-11: qty 1

## 2020-06-11 MED ORDER — ZIPRASIDONE MESYLATE 20 MG IM SOLR
20.0000 mg | Freq: Once | INTRAMUSCULAR | Status: AC
  Administered 2020-06-11: 20 mg via INTRAMUSCULAR

## 2020-06-11 MED ORDER — ZIPRASIDONE HCL 20 MG PO CAPS
20.0000 mg | ORAL_CAPSULE | ORAL | Status: DC
  Filled 2020-06-11: qty 1

## 2020-06-11 NOTE — ED Notes (Signed)
Report to include Situation, Background, Assessment, and Recommendations received from [name] RN. Patient alert and oriented, warm and dry, in no acute distress. Patient denies SI, HI, AVH and pain. Patient made aware of Q15 minute rounds and Rover and Officer presence for their safety. Patient instructed to come to me with needs or concerns.   

## 2020-06-11 NOTE — ED Notes (Signed)
Patient in his room saying people in hallway talking about him and he doesn't why

## 2020-06-11 NOTE — ED Notes (Addendum)
He is acting out  Yelling out, verbalizing threats  "I keep hearing a man out in the hall  - he is talking about me - I know that they are trying to hurt me"  Attempts made to reassure him  1:1 talk    Paranoia/agitation present

## 2020-06-11 NOTE — ED Notes (Signed)
Hourly rounding reveals patient in room. No complaints, stable, in no acute distress. Q15 minute rounds and monitoring via Rover and Officer to continue.   

## 2020-06-11 NOTE — ED Notes (Signed)
Hourly rounding completed at this time, patient currently asleep in room. No complaints, stable, and in no acute distress. Q15 minute rounds and monitoring via Rover and Officer to continue. 

## 2020-06-11 NOTE — ED Notes (Signed)
He is sleeping  VS to be taken when he awakens

## 2020-06-11 NOTE — ED Notes (Signed)
pts brother has called and requests someone from the psychiatry team to give him a call

## 2020-06-11 NOTE — ED Notes (Signed)
Pt out of room and talking to this nurse and security in quad. Pt whispering due to fear of upsetting other patients, pt also states he is afraid he was asleep and could not help others who were upset. Pt speaking of fear of losing freedom, believes psychiatrist has given him two options that are making his life worse, and is afraid of losing his freedom. Pt states he just wants someone to listen and is now telling his story of events that led him to arriving to ED. Pt continues to be fearful, paranoid and questioning.

## 2020-06-11 NOTE — ED Notes (Signed)
Pt asleep at this time, unable to collect vitals. Will collect pt vitals once awake. 

## 2020-06-11 NOTE — ED Notes (Signed)
Patient at his door saying their are people in his room making fun of him. No one is in the room at this time.

## 2020-06-11 NOTE — ED Notes (Signed)
Hourly rounding completed at this time, patient currently awake in room. No complaints, stable, and in no acute distress. Q15 minute rounds and monitoring via Rover and Officer to continue. °

## 2020-06-11 NOTE — ED Notes (Signed)
Snack and beverage given. 

## 2020-06-11 NOTE — ED Provider Notes (Signed)
Emergency Medicine Observation Re-evaluation Note  Alexander Beard is a 64 y.o. male, seen on rounds today.  Pt initially presented to the ED for complaints of Psychiatric Evaluation Currently, the patient is resting comfortably, inquiring about wait to go to group home. Advised that no definite time right now.   Physical Exam  BP 95/72   Pulse 78   Temp 98.4 F (36.9 C) (Oral)   Resp 20   Ht 5\' 8"  (1.727 m)   Wt 104.3 kg   SpO2 100%   BMI 34.96 kg/m  Physical Exam Vitals and nursing note reviewed.  HENT:     Head: Normocephalic and atraumatic.     Right Ear: External ear normal.     Left Ear: External ear normal.     Nose: Nose normal.  Cardiovascular:     Rate and Rhythm: Normal rate.  Pulmonary:     Effort: Pulmonary effort is normal. No respiratory distress.  Abdominal:     General: There is no distension.  Neurological:     Mental Status: Mental status is at baseline.  Psychiatric:        Mood and Affect: Mood normal.     ED Course / MDM  EKG:    I have reviewed the labs performed to date as well as medications administered while in observation.  Recent changes in the last 24 hours include none.  Plan  Current plan is for social work to dispo, cleared for d/c by psych. Patient is not under full IVC at this time.   Lucrezia Starch, MD 06/11/20 [phone]

## 2020-06-11 NOTE — Consult Note (Signed)
[facility] Consult   Reason for Consult:   Follow-up for this 64 year old man with autism who continues to be in the emergency room Referring Physician: Cinda Quest Patient Identification: Alexander Beard MRN:  [id number] Principal Diagnosis: Active autistic disorder Diagnosis:  Principal Problem:   Active autistic disorder Active Problems:   Schizoaffective disorder, bipolar type (HCC)   Schizoaffective disorder (HCC)   Intellectual disability   At risk for elopement   Total Time spent with patient: 30 minutes  Subjective:   Alexander Beard is a 64 y.o. male patient admitted with "don't send me any place demonic".  HPI: Patient seen chart reviewed. See previous notes. This is a 64 year old man with autism who was brought back to the emergency room after dangerous behavior problems at his new group home. Today he is having a spell of decompensating. Much of the day he has been shouting and screaming and is not responding well to verbal redirection. He is getting more disorganized in his thinking. Shouting is actually becoming disruptive to the whole emergency room. Patient would not stop with verbal redirection or with attempts at engaging him in soothing behavior.  Past Psychiatric History: Long history of chronic behavior problems  Risk to Self: Suicidal Ideation: No-Not Currently/Within Last 6 Months Suicidal Intent: No-Not Currently/Within Last 6 Months Is patient at risk for suicide?: No Suicidal Plan?: No Access to Means: No What has been your use of drugs/alcohol within the last 12 months?: None How many times?:  (UTA) Other Self Harm Risks: None Triggers for Past Attempts: None known Intentional Self Injurious Behavior: None Risk to Others: Homicidal Ideation: No Thoughts of Harm to Others: No Current Homicidal Intent: No Current Homicidal Plan: No Access to Homicidal Means: No Identified Victim: NOne History of harm to others?: No Assessment of Violence:  None Noted Violent Behavior Description: None Does patient have access to weapons?: No Criminal Charges Pending?: No Does patient have a court date: No Prior Inpatient Therapy: Prior Inpatient Therapy: Yes Prior Therapy Dates: 02/2020 Prior Therapy Facilty/Provider(s): [facility] Reason for Treatment: Schizoaffective Disorder Prior Outpatient Therapy: Prior Outpatient Therapy:  [facility])  Past Medical History:  Past Medical History:  Diagnosis Date  . Anxiety   . Anxiety disorder due to known physiological condition    HOSPITALIZED 10/18  . Arthritis   . Autistic disorder, residual state   . COPD (chronic obstructive pulmonary disease) ([place])   . Depression   . Developmental disorder   . Diabetes mellitus without complication ([place])   . Dyslipidemia   . Esophageal reflux   . HOH (hard of hearing)    MILDLY  . Hypertension   . Obesity   . Overactive bladder   . Palpitations    ANXIETY  . Schizophrenia, schizoaffective ([place])   . Sleep apnea   . Tremors of nervous system    HANDS DUE TO MEDICATIONS  . Urinary incontinence     Past Surgical History:  Procedure Laterality Date  . CATARACT EXTRACTION W/PHACO Left 11/14/2017   Procedure: CATARACT EXTRACTION PHACO AND INTRAOCULAR LENS PLACEMENT (IOC);  Surgeon: Birder Robson, MD;  Location: [facility] ORS;  Service: Ophthalmology;  Laterality: Left;  [place] 00:42 AP% 14.6 CDE 6.12 Fluid pack lot # [id number]  . COLONOSCOPY  01/28/2005  . COLONOSCOPY WITH PROPOFOL N/A 05/09/2016   Procedure: COLONOSCOPY WITH PROPOFOL;  Surgeon: Manya Silvas, MD;  Location: [facility] ENDOSCOPY;  Service: Endoscopy;  Laterality: N/A;  . ESOPHAGOGASTRODUODENOSCOPY N/A 05/09/2016   Procedure: ESOPHAGOGASTRODUODENOSCOPY (EGD);  Surgeon:  Manya Silvas, MD;  Location: [facility] ENDOSCOPY;  Service: Endoscopy;  Laterality: N/A;  . FRACTURE SURGERY     ORIF SHOULDER  . TONSILLECTOMY     Family History:  Family History  Problem Relation Age of Onset  . Hypertension  Mother   . Stroke Father   . Heart Problems Father    Family Psychiatric  History: See previous Social History:  Social History   Substance and Sexual Activity  Alcohol Use No  . Alcohol/week: 0.0 standard drinks     Social History   Substance and Sexual Activity  Drug Use No    Social History   Socioeconomic History  . Marital status: Single    Spouse name: Not on file  . Number of children: Not on file  . Years of education: Not on file  . Highest education level: Not on file  Occupational History  . Not on file  Tobacco Use  . Smoking status: Never Smoker  . Smokeless tobacco: Never Used  Vaping Use  . Vaping Use: Never used  Substance and Sexual Activity  . Alcohol use: No    Alcohol/week: 0.0 standard drinks  . Drug use: No  . Sexual activity: Never  Other Topics Concern  . Not on file  Social History Narrative   The patient never finished high school but did get his GED. 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 He is currently in disability and [name] is his legal guardian. He has been living in group homes for many years.      No pending legal charges   Social Determinants of Health   Financial Resource Strain:   . Difficulty of Paying Living Expenses: Not on file  Food Insecurity:   . Worried About Charity fundraiser in the Last Year: Not on file  . Ran Out of Food in the Last Year: Not on file  Transportation Needs:   . Lack of Transportation (Medical): Not on file  . Lack of Transportation (Non-Medical): Not on file  Physical Activity:   . Days of Exercise per Week: Not on file  . Minutes of Exercise per Session: Not on file  Stress:   . Feeling of Stress : Not on file  Social Connections:   . Frequency of Communication with Friends and Family: Not on file  . Frequency of Social Gatherings with Friends and Family: Not on file  . Attends Religious Services: Not on file  .  Active Member of Clubs or Organizations: Not on file  . Attends Archivist Meetings: Not on file  . Marital Status: Not on file   Additional Social History:    Allergies:   Allergies  Allergen Reactions  . Other Other (See Comments)    MYACINS  . Penicillins Nausea And Vomiting  . Cortizone-10 [Hydrocortisone] Rash    Labs:  Results for orders placed or performed during the hospital encounter of 06/09/20 (from the past 48 hour(s))  Comprehensive metabolic panel     Status: Abnormal   Collection Time: 06/09/20  6:42 PM  Result Value Ref Range   Sodium 141 135 - 145 mmol/L   Potassium 4.1 3.5 - 5.1 mmol/L   Chloride 103 98 - 111 mmol/L   CO2 26 22 - 32 mmol/L   Glucose, Bld 120 (H) 70 - 99 mg/dL    Comment: Glucose reference range applies only to samples taken after  fasting for at least 8 hours.   BUN 21 8 - 23 mg/dL   Creatinine, Ser 1.01 0.61 - 1.24 mg/dL   Calcium 9.4 8.9 - 10.3 mg/dL   Total Protein 7.7 6.5 - 8.1 g/dL   Albumin 3.9 3.5 - 5.0 g/dL   AST 21 15 - 41 U/L   ALT 8 0 - 44 U/L   Alkaline Phosphatase 64 38 - 126 U/L   Total Bilirubin 0.8 0.3 - 1.2 mg/dL   GFR, Estimated >60 >60 mL/min    Comment: (NOTE) Calculated using the CKD-EPI Creatinine Equation (2021)    Anion gap 12 5 - 15    Comment: Performed at [facility], [address] [place], [place] [address]  Ethanol     Status: None   Collection Time: 06/09/20  6:42 PM  Result Value Ref Range   Alcohol, Ethyl (B) <10 <10 mg/dL    Comment: (NOTE) Lowest detectable limit for serum alcohol is 10 mg/dL.  For medical purposes only. Performed at [facility], [address] [place], [place] [address]   Salicylate level     Status: Abnormal   Collection Time: 06/09/20  6:42 PM  Result Value Ref Range   Salicylate Lvl <8.5 (L) 7.0 - 30.0 mg/dL    Comment: Performed at [facility], [address] [place], [place] [address]  Acetaminophen level     Status:  Abnormal   Collection Time: 06/09/20  6:42 PM  Result Value Ref Range   Acetaminophen (Tylenol), Serum <10 (L) 10 - 30 ug/mL    Comment: (NOTE) Therapeutic concentrations vary significantly. A range of 10-30 ug/mL  may be an effective concentration for many patients. However, some  are best treated at concentrations outside of this range. Acetaminophen concentrations >150 ug/mL at 4 hours after ingestion  and >50 ug/mL at 12 hours after ingestion are often associated with  toxic reactions.  Performed at [facility], [address] [place], [place] [address]   cbc     Status: None   Collection Time: 06/09/20  6:42 PM  Result Value Ref Range   WBC 9.1 4.0 - 10.5 K/uL   RBC 4.91 4.22 - 5.81 MIL/uL   Hemoglobin 15.2 13.0 - 17.0 g/dL   HCT 45.8 39 - 52 %   MCV 93.3 80.0 - 100.0 fL   MCH 31.0 26.0 - 34.0 pg   MCHC 33.2 30.0 - 36.0 g/dL   RDW 12.3 11.5 - 15.5 %   Platelets 200 150 - 400 K/uL   nRBC 0.0 0.0 - 0.2 %    Comment: Performed at [facility], [address] [place],  [address]  Urine Drug Screen, Qualitative     Status: Abnormal   Collection Time: 06/09/20  8:59 PM  Result Value Ref Range   Tricyclic, Ur Screen POSITIVE (A) NONE DETECTED   Amphetamines, Ur Screen NONE DETECTED NONE DETECTED   MDMA (Ecstasy)Ur Screen NONE DETECTED NONE DETECTED   Cocaine Metabolite,Ur [place] NONE DETECTED NONE DETECTED   Opiate, Ur Screen NONE DETECTED NONE DETECTED   Phencyclidine (PCP) Ur S NONE DETECTED NONE DETECTED   Cannabinoid 50 Ng, Ur [place] NONE DETECTED NONE DETECTED   Barbiturates, Ur Screen NONE DETECTED NONE DETECTED   Benzodiazepine, Ur Scrn POSITIVE (A) NONE DETECTED   Methadone Scn, Ur NONE DETECTED NONE DETECTED    Comment: (NOTE) Tricyclics + metabolites, urine    Cutoff 1000 ng/mL Amphetamines + metabolites, urine  Cutoff 1000 ng/mL  MDMA (Ecstasy), urine              Cutoff 500 ng/mL Cocaine Metabolite, urine          Cutoff 300 ng/mL Opiate +  metabolites, urine        Cutoff 300 ng/mL Phencyclidine (PCP), urine         Cutoff 25 ng/mL Cannabinoid, urine                 Cutoff 50 ng/mL Barbiturates + metabolites, urine  Cutoff 200 ng/mL Benzodiazepine, urine              Cutoff 200 ng/mL Methadone, urine                   Cutoff 300 ng/mL  The urine drug screen provides only a preliminary, unconfirmed analytical test result and should not be used for non-medical purposes. Clinical consideration and professional judgment should be applied to any positive drug screen result due to possible interfering substances. A more specific alternate chemical method must be used in order to obtain a confirmed analytical result. Gas chromatography / mass spectrometry (GC/MS) is the preferred confirm atory method. Performed at [facility], [address] [place], [place] [address]   Resp Panel by RT-PCR (Flu A&B, Covid) Nasopharyngeal Swab     Status: None   Collection Time: 06/10/20  7:19 AM   Specimen: Nasopharyngeal Swab; Nasopharyngeal(NP) swabs in vial transport medium  Result Value Ref Range   SARS Coronavirus 2 by RT PCR NEGATIVE NEGATIVE    Comment: (NOTE) SARS-CoV-2 target nucleic acids are NOT DETECTED.  The SARS-CoV-2 RNA is generally detectable in upper respiratory specimens during the acute phase of infection. The lowest concentration of SARS-CoV-2 viral copies this assay can detect is 138 copies/mL. A negative result does not preclude SARS-Cov-2 infection and should not be used as the sole basis for treatment or other patient management decisions. A negative result may occur with  improper specimen collection/handling, submission of specimen other than nasopharyngeal swab, presence of viral mutation(s) within the areas targeted by this assay, and inadequate number of viral copies(<138 copies/mL). A negative result must be combined with clinical observations, patient history, and epidemiological information.  The expected result is Negative.  Fact Sheet for Patients:  EntrepreneurPulse.com.au  Fact Sheet for Healthcare Providers:  IncredibleEmployment.be  This test is no t yet approved or cleared by the [place] FDA and  has been authorized for detection and/or diagnosis of SARS-CoV-2 by FDA under an Emergency Use Authorization (EUA). This EUA will remain  in effect (meaning this test can be used) for the duration of the COVID-19 declaration under Section 564(b)(1) of the Act, 21 U.S.C.section 360bbb-3(b)(1), unless the authorization is terminated  or revoked sooner.       Influenza A by PCR NEGATIVE NEGATIVE   Influenza B by PCR NEGATIVE NEGATIVE    Comment: (NOTE) The Xpert Xpress SARS-CoV-2/FLU/RSV plus assay is intended as an aid in the diagnosis of influenza from Nasopharyngeal swab specimens and should not be used as a sole basis for treatment. Nasal washings and aspirates are unacceptable for Xpert Xpress SARS-CoV-2/FLU/RSV testing.  Fact Sheet for Patients: EntrepreneurPulse.com.au  Fact Sheet for Healthcare Providers: IncredibleEmployment.be  This test is not yet approved or cleared by the [place] FDA and has been authorized for detection and/or diagnosis of SARS-CoV-2 by FDA under an Emergency Use Authorization (EUA). This EUA will remain in effect (meaning this test can be used) for the  duration of the COVID-19 declaration under Section 564(b)(1) of the Act, 21 U.S.C. section 360bbb-3(b)(1), unless the authorization is terminated or revoked.  Performed at [facility], [address] [place], [place] [address]     Current Facility-Administered Medications  Medication Dose Route Frequency Provider Last Rate Last Admin  . LORazepam (ATIVAN) 2 MG/ML injection           . aspirin EC tablet 81 mg  81 mg Oral Daily Tayshawn Purnell, [name], MD   81 mg at 06/11/20 1347  . clonazePAM  (KLONOPIN) tablet 1 mg  1 mg Oral QID Merrisa Skorupski, [name], MD   1 mg at 06/11/20 1342  . divalproex (DEPAKOTE ER) 24 hr tablet 1,500 mg  1,500 mg Oral QHS Himmat Enberg T, MD   1,500 mg at 06/10/20 2227  . gabapentin (NEURONTIN) capsule 100 mg  100 mg Oral TID Kortnee Bas T, MD   100 mg at 06/11/20 1343  . hydrOXYzine (ATARAX/VISTARIL) tablet 10 mg  10 mg Oral Q6H PRN Kapena Hamme,  T, MD      . lamoTRIgine (LAMICTAL) tablet 150 mg  150 mg Oral QHS Jezebelle Ledwell, [name], MD   150 mg at 06/10/20 2223  . linagliptin (TRADJENTA) tablet 5 mg  5 mg Oral Daily Tj Kitchings, [name], MD   5 mg at 06/11/20 1341  . loratadine (CLARITIN) tablet 10 mg  10 mg Oral Daily Abdiel Blackerby, [name], MD   10 mg at 06/11/20 1342  . LORazepam (ATIVAN) tablet 2 mg  2 mg Oral Q6H PRN Dalene Robards, [name], MD   2 mg at 06/11/20 1342  . metFORMIN (GLUCOPHAGE) tablet 850 mg  850 mg Oral BID WC Roben Tatsch, [name], MD   850 mg at 06/10/20 1736  . metoprolol succinate (TOPROL-XL) 24 hr tablet 50 mg  50 mg Oral Daily Menno Vanbergen, [name], MD   50 mg at 06/11/20 1343  . oxybutynin (DITROPAN) tablet 10 mg  10 mg Oral BID Hughie Melroy, [name], MD   10 mg at 06/11/20 1341  . pantoprazole (PROTONIX) EC tablet 40 mg  40 mg Oral Daily Goro Wenrick, [name], MD   40 mg at 06/11/20 1346  . prazosin (MINIPRESS) capsule 1 mg  1 mg Oral QHS Breck Hollinger, [name], MD   1 mg at 06/10/20 2228  . QUEtiapine (SEROQUEL) tablet 100 mg  100 mg Oral QID Abigael Mogle T, MD   100 mg at 06/11/20 1342  . QUEtiapine (SEROQUEL) tablet 400 mg  400 mg Oral QHS Evy Lutterman T, MD   400 mg at 06/10/20 2225  . sertraline (ZOLOFT) tablet 50 mg  50 mg Oral BID Kayde Warehime, [name], MD   50 mg at 06/11/20 1341  . simethicone (MYLICON) chewable tablet 80 mg  80 mg Oral QID , [name], MD   80 mg at 06/10/20 2223  . simvastatin (ZOCOR) tablet 20 mg  20 mg Oral q1800 Terik Haughey T, MD   20 mg at 06/11/20 1340  . tamsulosin (FLOMAX) capsule 0.8 mg  0.8 mg Oral QPC supper Zorina Mallin T, MD   0.8 mg at 06/11/20  1343  . ziprasidone (GEODON) capsule 20 mg  20 mg Oral STAT Teague, [name] H, RN       Current Outpatient Medications  Medication Sig Dispense Refill  . acetaminophen (TYLENOL) 650 MG CR tablet Take 650 mg by mouth every 12 (twelve) hours as needed for pain.    Marland Kitchen aspirin EC 81 MG EC tablet  Take 1 tablet (81 mg total) by mouth daily. Swallow whole. 90 tablet 1  . clonazePAM (KLONOPIN) 1 MG tablet Take 1 tablet (1 mg total) by mouth 4 (four) times daily. 360 tablet 1  . divalproex (DEPAKOTE ER) 500 MG 24 hr tablet Take 3 tablets (1,500 mg total) by mouth at bedtime. 270 tablet 1  . gabapentin (NEURONTIN) 100 MG capsule Take 1 capsule (100 mg total) by mouth 3 (three) times daily. 270 capsule 1  . hydrOXYzine (ATARAX/VISTARIL) 10 MG tablet Take 1 tablet (10 mg total) by mouth 3 (three) times daily as needed for anxiety. 270 tablet 1  . lamoTRIgine (LAMICTAL) 150 MG tablet Take 1 tablet (150 mg total) by mouth at bedtime. 90 tablet 1  . linagliptin (TRADJENTA) 5 MG TABS tablet Take 1 tablet (5 mg total) by mouth daily. 90 tablet 1  . loratadine (CLARITIN) 10 MG tablet Take 1 tablet (10 mg total) by mouth daily. 90 tablet 1  . LORazepam (ATIVAN) 2 MG tablet Take 1 tablet (2 mg total) by mouth every 6 (six) hours as needed (severe anxiety). 180 tablet 1  . metFORMIN (GLUCOPHAGE) 850 MG tablet Take 1 tablet (850 mg total) by mouth 2 (two) times daily with a meal. 180 tablet 1  . metoprolol succinate (TOPROL-XL) 50 MG 24 hr tablet Take 1 tablet (50 mg total) by mouth daily. 90 tablet 1  . oxybutynin (DITROPAN) 5 MG tablet Take 2 tablets (10 mg total) by mouth 2 (two) times daily. 180 tablet 1  . pantoprazole (PROTONIX) 40 MG tablet Take 1 tablet (40 mg total) by mouth daily. 90 tablet 1  . prazosin (MINIPRESS) 1 MG capsule Take 1 capsule (1 mg total) by mouth at bedtime. 90 capsule 1  . QUEtiapine (SEROQUEL) 100 MG tablet Take 1 tablet (100 mg total) by mouth 4 (four) times daily. 360 tablet 1  .  QUEtiapine (SEROQUEL) 400 MG tablet Take 1 tablet (400 mg total) by mouth at bedtime. 90 tablet 1  . sertraline (ZOLOFT) 50 MG tablet Take 1 tablet (50 mg total) by mouth 2 (two) times daily. 180 tablet 1  . simethicone (MYLICON) 80 MG chewable tablet Chew 1 tablet (80 mg total) by mouth 3 (three) times daily. 270 tablet 1  . simvastatin (ZOCOR) 20 MG tablet Take 1 tablet (20 mg total) by mouth daily at 6 PM. 90 tablet 1  . tamsulosin (FLOMAX) 0.4 MG CAPS capsule Take 2 capsules (0.8 mg total) by mouth daily after supper. 90 capsule 1    Musculoskeletal: Strength & Muscle Tone: within normal limits Gait & Station: normal Patient leans: N/A  Psychiatric Specialty Exam: Physical Exam Vitals and nursing note reviewed.  Constitutional:      Appearance: He is well-developed.  HENT:     Head: Normocephalic and atraumatic.  Eyes:     Conjunctiva/sclera: Conjunctivae normal.     Pupils: Pupils are equal, round, and reactive to light.  Cardiovascular:     Heart sounds: Normal heart sounds.  Pulmonary:     Effort: Pulmonary effort is normal.  Abdominal:     Palpations: Abdomen is soft.  Musculoskeletal:        General: Normal range of motion.     Cervical back: Normal range of motion.  Skin:    General: Skin is warm and dry.  Neurological:     General: No focal deficit present.     Mental Status: He is alert.  Psychiatric:  Attention and Perception: He is inattentive.        Mood and Affect: Affect is inappropriate.        Speech: Speech is rapid and pressured and tangential.        Behavior: Behavior is agitated.        Thought Content: Thought content is paranoid.        Cognition and Memory: Cognition is impaired. Memory is impaired.        Judgment: Judgment is inappropriate.     Review of Systems  Constitutional: Negative.   HENT: Negative.   Eyes: Negative.   Respiratory: Negative.   Cardiovascular: Negative.   Gastrointestinal: Negative.   Musculoskeletal:  Negative.   Skin: Negative.   Neurological: Negative.   Psychiatric/Behavioral: Positive for dysphoric mood. The patient is nervous/anxious.     Blood pressure 95/67, pulse 81, temperature 98.3 F (36.8 C), temperature source Oral, resp. rate 18, height 5\' 8"  (1.727 m), weight 104.3 kg, SpO2 100 %.Body mass index is 34.96 kg/m.  General Appearance: Disheveled  Eye Contact:  Fair  Speech:  Pressured  Volume:  Increased  Mood:  Angry and Irritable  Affect:  Inappropriate  Thought Process:  Disorganized  Orientation:  Negative  Thought Content:  Illogical, Paranoid Ideation and Tangential  Suicidal Thoughts:  No  Homicidal Thoughts:  No  Memory:  Immediate;   Fair Recent;   Poor Remote;   Poor  Judgement:  Poor  Insight:  Lacking  Psychomotor Activity:  Restlessness  Concentration:  Concentration: Fair  Recall:  [employer] of Knowledge:  Fair  Language:  Fair  Akathisia:  No  Handed:  Right  AIMS (if indicated):     Assets:  Desire for Improvement Social Support  ADL's:  Impaired  Cognition:  Impaired,  Mild and Moderate  Sleep:        Treatment Plan Summary: Medication management and Plan His behavior has been disruptive this afternoon to the point of upsetting the milieu and the treatments of others his new mood [place] to group. Orders placed for a shot of Geodon and Ativan. I had been hoping we could arrange for him to go back to his group home today but clearly he cannot do that in this condition. We may be in a paradoxical situation where the longer he stays in the emergency room the more he decompensates but attempts to calm him down are needed before he can be discharged. No change to other medications at this point. The group home has stated that they would be agreeable to taking him back but have warned that another behavior like this would end their affiliation with him  Disposition: Patient does not meet criteria for psychiatric inpatient admission. Supportive  therapy provided about ongoing stressors.  Alethia Berthold, MD 06/11/2020 3:59 PM

## 2020-06-11 NOTE — BH Assessment (Signed)
Writer called and left a HIPPA Compliant message with Group Home ([facility]-[phone]), requesting a return phone call, Group home did not answer

## 2020-06-11 NOTE — ED Notes (Signed)
Patient out in hallway crying asking for the voices to stop.

## 2020-06-11 NOTE — ED Notes (Signed)
He is agitated and yelling out again  - MD [name] to his bedside   IM injections to be administered  Verbal order received

## 2020-06-11 NOTE — TOC Progression Note (Addendum)
Transition of Care [facility]) - Progression Note    Patient Details  Name: Alexander Beard MRN: [id number] Date of Birth: 03/15/1956  [facility]) CM/SW [place], [place] Phone Number: [phone] 06/11/2020, 3:34 PM  Clinical Narrative:     CSW called [facility],  [phone], but there was no answer.  CSW returned call and left voicemail for Shanon Brow [phone] for patient status update.       Expected Discharge Plan and Services                                                 Social Determinants of Health (SDOH) Interventions    Readmission Risk Interventions No flowsheet data found.

## 2020-06-11 NOTE — ED Notes (Signed)
VOLUNTARY/pending return to his group home

## 2020-06-12 DIAGNOSIS — F259 Schizoaffective disorder, unspecified: Secondary | ICD-10-CM | POA: Diagnosis not present

## 2020-06-12 DIAGNOSIS — F840 Autistic disorder: Secondary | ICD-10-CM | POA: Diagnosis not present

## 2020-06-12 NOTE — ED Notes (Signed)
Hourly rounding reveals patient in room. No complaints, stable, in no acute distress. Q15 minute rounds and monitoring via Rover and Officer to continue.   

## 2020-06-12 NOTE — ED Notes (Signed)
Pt provided lunch tray,

## 2020-06-12 NOTE — Consult Note (Signed)
[facility] Consult   Reason for Consult: Follow-up consult once again in the emergency room for this 64 year old man with autism who has been awaiting a disposition Referring Physician: Archie Balboa Patient Identification: Alexander Beard MRN:  [id number] Principal Diagnosis: Active autistic disorder Diagnosis:  Principal Problem:   Active autistic disorder Active Problems:   Schizoaffective disorder, bipolar type (HCC)   Schizoaffective disorder (HCC)   Intellectual disability   At risk for elopement   Total Time spent with patient: 30 minutes  Subjective:   Alexander Beard is a 64 y.o. male patient admitted with "I think I can do it".  HPI: See previous notes.  Jazir has been awaiting a disposition for a couple of days.  We had hoped to get him discharged back to his group home but yesterday was a very bad day with a lot of outbursts.  Today he is doing better.  He has been quiet and calm in his room today.  When I went in to speak to him he was appropriate in his interactions and pretty much at his baseline in terms of mental state.  We talked about using more appropriate coping skills to deal with stress rather than running away and he agrees that he can try to do that.  We also discussed the fact that he does have as needed medicines and that asking for a as needed medicine for anxiety is perfectly acceptable if he needs it as well.  Past Psychiatric History: Long history of chronic behavior problems related to developmental disability  Risk to Self: Suicidal Ideation: No-Not Currently/Within Last 6 Months Suicidal Intent: No-Not Currently/Within Last 6 Months Is patient at risk for suicide?: No Suicidal Plan?: No Access to Means: No What has been your use of drugs/alcohol within the last 12 months?: None How many times?:  (UTA) Other Self Harm Risks: None Triggers for Past Attempts: None known Intentional Self Injurious Behavior: None Risk to Others: Homicidal  Ideation: No Thoughts of Harm to Others: No Current Homicidal Intent: No Current Homicidal Plan: No Access to Homicidal Means: No Identified Victim: NOne History of harm to others?: No Assessment of Violence: None Noted Violent Behavior Description: None Does patient have access to weapons?: No Criminal Charges Pending?: No Does patient have a court date: No Prior Inpatient Therapy: Prior Inpatient Therapy: Yes Prior Therapy Dates: 02/2020 Prior Therapy Facilty/Provider(s): [facility] Reason for Treatment: Schizoaffective Disorder Prior Outpatient Therapy: Prior Outpatient Therapy:  [facility])  Past Medical History:  Past Medical History:  Diagnosis Date  . Anxiety   . Anxiety disorder due to known physiological condition    HOSPITALIZED 10/18  . Arthritis   . Autistic disorder, residual state   . COPD (chronic obstructive pulmonary disease) ([place])   . Depression   . Developmental disorder   . Diabetes mellitus without complication ([place])   . Dyslipidemia   . Esophageal reflux   . HOH (hard of hearing)    MILDLY  . Hypertension   . Obesity   . Overactive bladder   . Palpitations    ANXIETY  . Schizophrenia, schizoaffective ([place])   . Sleep apnea   . Tremors of nervous system    HANDS DUE TO MEDICATIONS  . Urinary incontinence     Past Surgical History:  Procedure Laterality Date  . CATARACT EXTRACTION W/PHACO Left 11/14/2017   Procedure: CATARACT EXTRACTION PHACO AND INTRAOCULAR LENS PLACEMENT (IOC);  Surgeon: Birder Robson, MD;  Location: [facility] ORS;  Service: Ophthalmology;  Laterality: Left;  [place] 00:42 AP% 14.6 CDE 6.12 Fluid pack lot # [id number]  . COLONOSCOPY  01/28/2005  . COLONOSCOPY WITH PROPOFOL N/A 05/09/2016   Procedure: COLONOSCOPY WITH PROPOFOL;  Surgeon: Manya Silvas, MD;  Location: [facility] ENDOSCOPY;  Service: Endoscopy;  Laterality: N/A;  . ESOPHAGOGASTRODUODENOSCOPY N/A 05/09/2016   Procedure: ESOPHAGOGASTRODUODENOSCOPY (EGD);  Surgeon: Manya Silvas,  MD;  Location: [facility] ENDOSCOPY;  Service: Endoscopy;  Laterality: N/A;  . FRACTURE SURGERY     ORIF SHOULDER  . TONSILLECTOMY     Family History:  Family History  Problem Relation Age of Onset  . Hypertension Mother   . Stroke Father   . Heart Problems Father    Family Psychiatric  History: See previous Social History:  Social History   Substance and Sexual Activity  Alcohol Use No  . Alcohol/week: 0.0 standard drinks     Social History   Substance and Sexual Activity  Drug Use No    Social History   Socioeconomic History  . Marital status: Single    Spouse name: Not on file  . Number of children: Not on file  . Years of education: Not on file  . Highest education level: Not on file  Occupational History  . Not on file  Tobacco Use  . Smoking status: Never Smoker  . Smokeless tobacco: Never Used  Vaping Use  . Vaping Use: Never used  Substance and Sexual Activity  . Alcohol use: No    Alcohol/week: 0.0 standard drinks  . Drug use: No  . Sexual activity: Never  Other Topics Concern  . Not on file  Social History Narrative   The patient never finished high school but did get his GED. 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 He is currently in disability and [name] is his legal guardian. He has been living in group homes for many years.      No pending legal charges   Social Determinants of Health   Financial Resource Strain:   . Difficulty of Paying Living Expenses: Not on file  Food Insecurity:   . Worried About Charity fundraiser in the Last Year: Not on file  . Ran Out of Food in the Last Year: Not on file  Transportation Needs:   . Lack of Transportation (Medical): Not on file  . Lack of Transportation (Non-Medical): Not on file  Physical Activity:   . Days of Exercise per Week: Not on file  . Minutes of Exercise per Session: Not on file  Stress:   . Feeling of Stress : Not on  file  Social Connections:   . Frequency of Communication with Friends and Family: Not on file  . Frequency of Social Gatherings with Friends and Family: Not on file  . Attends Religious Services: Not on file  . Active Member of Clubs or Organizations: Not on file  . Attends Archivist Meetings: Not on file  . Marital Status: Not on file   Additional Social History:    Allergies:   Allergies  Allergen Reactions  . Other Other (See Comments)    MYACINS  . Penicillins Nausea And Vomiting  . Cortizone-10 [Hydrocortisone] Rash    Labs: No results found for this or any previous visit (from the past 48 hour(s)).  Current Facility-Administered Medications  Medication Dose Route Frequency Provider Last Rate Last Admin  . aspirin EC tablet 81 mg  81 mg Oral  Daily Selim Durden, [name], MD   81 mg at 06/12/20 1306  . clonazePAM (KLONOPIN) tablet 1 mg  1 mg Oral QID Charlynn Salih,  T, MD   1 mg at 06/12/20 1300  . divalproex (DEPAKOTE ER) 24 hr tablet 1,500 mg  1,500 mg Oral QHS Kysa Calais,  T, MD   1,500 mg at 06/12/20 0252  . gabapentin (NEURONTIN) capsule 100 mg  100 mg Oral TID Romaldo Saville,  T, MD   100 mg at 06/12/20 1306  . hydrOXYzine (ATARAX/VISTARIL) tablet 10 mg  10 mg Oral Q6H PRN Naiara Lombardozzi,  T, MD      . lamoTRIgine (LAMICTAL) tablet 150 mg  150 mg Oral QHS Eunique Balik, [name], MD   150 mg at 06/12/20 0251  . linagliptin (TRADJENTA) tablet 5 mg  5 mg Oral Daily Lawren Sexson T, MD   5 mg at 06/12/20 1300  . loratadine (CLARITIN) tablet 10 mg  10 mg Oral Daily Novi Calia, [name], MD   10 mg at 06/12/20 1305  . LORazepam (ATIVAN) tablet 2 mg  2 mg Oral Q6H PRN Janeisha Ryle, [name], MD   2 mg at 06/12/20 1305  . metFORMIN (GLUCOPHAGE) tablet 850 mg  850 mg Oral BID WC Kialee Kham, [name], MD   850 mg at 06/12/20 1301  . metoprolol succinate (TOPROL-XL) 24 hr tablet 50 mg  50 mg Oral Daily Jaye Polidori, [name], MD   50 mg at 06/12/20 1306  . oxybutynin (DITROPAN) tablet 10 mg  10 mg Oral BID Marlyn Rabine,  [name], MD   10 mg at 06/12/20 1302  . pantoprazole (PROTONIX) EC tablet 40 mg  40 mg Oral Daily Chanan Detwiler, [name], MD   40 mg at 06/12/20 1305  . prazosin (MINIPRESS) capsule 1 mg  1 mg Oral QHS Eiliana Drone, [name], MD   1 mg at 06/10/20 2228  . QUEtiapine (SEROQUEL) tablet 100 mg  100 mg Oral QID Jayceon Troy T, MD   100 mg at 06/12/20 1305  . QUEtiapine (SEROQUEL) tablet 400 mg  400 mg Oral QHS Kiko Ripp T, MD   400 mg at 06/12/20 0249  . sertraline (ZOLOFT) tablet 50 mg  50 mg Oral BID , [name], MD   50 mg at 06/12/20 1308  . simethicone (MYLICON) chewable tablet 80 mg  80 mg Oral QID , [name], MD   80 mg at 06/12/20 1301  . simvastatin (ZOCOR) tablet 20 mg  20 mg Oral q1800 , [name], MD   20 mg at 06/12/20 1306  . tamsulosin (FLOMAX) capsule 0.8 mg  0.8 mg Oral QPC supper , [name], MD   0.8 mg at 06/12/20 1301   Current Outpatient Medications  Medication Sig Dispense Refill  . acetaminophen (TYLENOL) 650 MG CR tablet Take 650 mg by mouth every 12 (twelve) hours as needed for pain.    Marland Kitchen aspirin EC 81 MG EC tablet Take 1 tablet (81 mg total) by mouth daily. Swallow whole. 90 tablet 1  . clonazePAM (KLONOPIN) 1 MG tablet Take 1 tablet (1 mg total) by mouth 4 (four) times daily. 360 tablet 1  . divalproex (DEPAKOTE ER) 500 MG 24 hr tablet Take 3 tablets (1,500 mg total) by mouth at bedtime. 270 tablet 1  . gabapentin (NEURONTIN) 100 MG capsule Take 1 capsule (100 mg total) by mouth 3 (three) times daily. 270 capsule 1  . hydrOXYzine (ATARAX/VISTARIL) 10 MG tablet Take 1 tablet (10 mg total) by mouth 3 (three) times daily  as needed for anxiety. 270 tablet 1  . lamoTRIgine (LAMICTAL) 150 MG tablet Take 1 tablet (150 mg total) by mouth at bedtime. 90 tablet 1  . linagliptin (TRADJENTA) 5 MG TABS tablet Take 1 tablet (5 mg total) by mouth daily. 90 tablet 1  . loratadine (CLARITIN) 10 MG tablet Take 1 tablet (10 mg total) by mouth daily. 90 tablet 1  . LORazepam (ATIVAN)  2 MG tablet Take 1 tablet (2 mg total) by mouth every 6 (six) hours as needed (severe anxiety). 180 tablet 1  . metFORMIN (GLUCOPHAGE) 850 MG tablet Take 1 tablet (850 mg total) by mouth 2 (two) times daily with a meal. 180 tablet 1  . metoprolol succinate (TOPROL-XL) 50 MG 24 hr tablet Take 1 tablet (50 mg total) by mouth daily. 90 tablet 1  . oxybutynin (DITROPAN) 5 MG tablet Take 2 tablets (10 mg total) by mouth 2 (two) times daily. 180 tablet 1  . pantoprazole (PROTONIX) 40 MG tablet Take 1 tablet (40 mg total) by mouth daily. 90 tablet 1  . prazosin (MINIPRESS) 1 MG capsule Take 1 capsule (1 mg total) by mouth at bedtime. 90 capsule 1  . QUEtiapine (SEROQUEL) 100 MG tablet Take 1 tablet (100 mg total) by mouth 4 (four) times daily. 360 tablet 1  . QUEtiapine (SEROQUEL) 400 MG tablet Take 1 tablet (400 mg total) by mouth at bedtime. 90 tablet 1  . sertraline (ZOLOFT) 50 MG tablet Take 1 tablet (50 mg total) by mouth 2 (two) times daily. 180 tablet 1  . simethicone (MYLICON) 80 MG chewable tablet Chew 1 tablet (80 mg total) by mouth 3 (three) times daily. 270 tablet 1  . simvastatin (ZOCOR) 20 MG tablet Take 1 tablet (20 mg total) by mouth daily at 6 PM. 90 tablet 1  . tamsulosin (FLOMAX) 0.4 MG CAPS capsule Take 2 capsules (0.8 mg total) by mouth daily after supper. 90 capsule 1    Musculoskeletal: Strength & Muscle Tone: within normal limits Gait & Station: normal Patient leans: N/A  Psychiatric Specialty Exam: Physical Exam Vitals and nursing note reviewed.  Constitutional:      Appearance: He is well-developed.  HENT:     Head: Normocephalic and atraumatic.  Eyes:     Conjunctiva/sclera: Conjunctivae normal.     Pupils: Pupils are equal, round, and reactive to light.  Cardiovascular:     Heart sounds: Normal heart sounds.  Pulmonary:     Effort: Pulmonary effort is normal.  Abdominal:     Palpations: Abdomen is soft.  Musculoskeletal:        General: Normal range of motion.      Cervical back: Normal range of motion.  Skin:    General: Skin is warm and dry.  Neurological:     General: No focal deficit present.     Mental Status: He is alert.  Psychiatric:        Attention and Perception: Attention normal.        Mood and Affect: Mood is anxious.        Speech: Speech normal.        Behavior: Behavior is cooperative.        Thought Content: Thought content normal.        Cognition and Memory: Cognition is impaired.        Judgment: Judgment is impulsive.     Review of Systems  Constitutional: Negative.   HENT: Negative.   Eyes: Negative.   Respiratory: Negative.  Cardiovascular: Negative.   Gastrointestinal: Negative.   Musculoskeletal: Negative.   Skin: Negative.   Neurological: Negative.   Psychiatric/Behavioral: Negative.     Blood pressure 124/87, pulse 82, temperature 98.1 F (36.7 C), temperature source Oral, resp. rate 16, height 5\' 8"  (1.727 m), weight 104.3 kg, SpO2 98 %.Body mass index is 34.96 kg/m.  General Appearance: Casual  Eye Contact:  Good  Speech:  Clear and Coherent  Volume:  Normal  Mood:  Euthymic  Affect:  Constricted  Thought Process:  Coherent  Orientation:  Full (Time, Place, and Person)  Thought Content:  Logical  Suicidal Thoughts:  No  Homicidal Thoughts:  No  Memory:  Immediate;   Fair Recent;   Fair Remote;   Fair  Judgement:  Fair  Insight:  Fair  Psychomotor Activity:  Normal  Concentration:  Concentration: Fair  Recall:  [employer] of Knowledge:  Fair  Language:  Fair  Akathisia:  No  Handed:  Right  AIMS (if indicated):     Assets:  Desire for Improvement Resilience Social Support  ADL's:  Impaired  Cognition:  Impaired,  Mild  Sleep:        Treatment Plan Summary: Medication management and Plan Spent some time trying to coach him and giving him the usual encouragement and praise.  Meanwhile I will be hoping and praying that this is going to work and that he will be able to employ better  coping skills and not freak out.  I gently reminded him that if he loses this placement at a group home he will be back in the hospital for a lengthy stay most likely.  No change to medicine today.  Case reviewed with ER staff.  Disposition: Patient does not meet criteria for psychiatric inpatient admission.  Alethia Berthold, MD 06/12/2020 4:34 PM

## 2020-06-12 NOTE — ED Notes (Signed)
Pt given diet coke with no ice. Adamant about speaking with Education officer, museum, brother and niece. Will alert nurse.

## 2020-06-12 NOTE — ED Notes (Signed)
Pt given new mask per request

## 2020-06-12 NOTE — ED Notes (Signed)
Hourly rounding revealed patient in room. No complaints, stable, in no acute distress. Q15 minute rounds and monitoring via Rover and Officer to continue.  

## 2020-06-12 NOTE — ED Notes (Signed)
Pt provided with two breakfast trays.

## 2020-06-12 NOTE — ED Notes (Signed)
Hourly rounding reveals patient in room. No complaints, stable, in no acute stress. Q15 minute rounds and monitoring via Engineer, drilling to continue.

## 2020-06-12 NOTE — ED Notes (Signed)
Pt to bathroom.  Returns to room and begins cleaning it.  Pt insisted on doing it himself.  Provided clean clothing, body wipes, clean sheets and blankets.  Pt making his bed and asking to mop his floor himself.  Explained I could not give him a mop but I was able to do it.

## 2020-06-12 NOTE — ED Notes (Signed)
VS not taken, patient asleep 

## 2020-06-12 NOTE — ED Provider Notes (Signed)
Emergency Medicine Observation Re-evaluation Note  Alexander Beard is a 64 y.o. male, seen on rounds today.  Pt initially presented to the ED for complaints of Psychiatric Evaluation Currently, the patient is resting, voices no medical complaint.  Physical Exam  BP 105/73 (BP Location: Right Arm)   Pulse 72   Temp 98.8 F (37.1 C) (Oral)   Resp 18   Ht 5\' 8"  (1.727 m)   Wt 104.3 kg   SpO2 100%   BMI 34.96 kg/m  Physical Exam General: Resting in no acute distress Cardiac: No cyanosis Lungs: Equal rise and fall Psych: Not agitated  ED Course / MDM  EKG:    I have reviewed the labs performed to date as well as medications administered while in observation.  Recent changes in the last 24 hours include no events overnight.  Plan  Current plan is for hopefully discharge back to group home today. Patient is not under full IVC at this time.   Paulette Blanch, MD 06/12/20 [phone]

## 2020-06-12 NOTE — Discharge Instructions (Signed)
Please seek medical attention for any high fevers, chest pain, shortness of breath, change in behavior, persistent vomiting, bloody stool or any other new or concerning symptoms.  

## 2020-06-12 NOTE — TOC Transition Note (Signed)
Transition of Care [facility]) - CM/SW Discharge Note   Patient Details  Name: Alexander Beard MRN: [id number] Date of Birth: 1955/08/05  Transition of Care [facility]) CM/SW Contact:  Ova Freshwater Phone Number: [phone] 06/12/2020, 4:09 PM   Clinical Narrative:      Patient will d/c to [place], [place] [phone] w/ [facility] via [place].  EDP/ED Staff notified.  CSW spoke with patient's brother Worthy Flank and updated him on d/c plan.  TOC complete.        Patient Goals and CMS Choice        Discharge Placement                       Discharge Plan and Services                                     Social Determinants of Health (SDOH) Interventions     Readmission Risk Interventions No flowsheet data found.

## 2020-06-12 NOTE — ED Notes (Signed)
Pt is currently asleep, will obtain vitals when Pt wakes.

## 2020-06-12 NOTE — ED Notes (Signed)
Pt informed that social worker will be making rounds soon. Pt provided with another diet coke with no ice.

## 2020-06-12 NOTE — ED Notes (Signed)
Hourly rounding revealed patient in room. No complaints, stable, in no acute stress. Q15 minute rounds and monitoring via Engineer, drilling to continue.

## 2020-06-12 NOTE — ED Notes (Signed)
Hourly rounding revealed patient in room. No complaints, stable, in no acute stress. Q15 minute rounding and monitoring via Engineer, drilling to continue.

## 2020-06-12 NOTE — TOC Progression Note (Deleted)
Transition of Care [facility]) - Progression Note    Patient Details  Name: RANDEL HARGENS MRN: [id number] Date of Birth: March 15, 1956  Transition of Care [facility]) CM/SW [place], [place] Phone Number: [phone]   06/12/2020, 3:34 PM  Clinical Narrative:     Patient will d/c to [place], [place] [phone] w/ [facility] via [place].  EDP/ED Staff notified.  CSW spoke with patient's brother Worthy Flank and updated him on d/c plan.  TOC complete.      Expected Discharge Plan and Services                                                 Social Determinants of Health (SDOH) Interventions    Readmission Risk Interventions No flowsheet data found.

## 2020-06-12 NOTE — ED Notes (Signed)
Hourly rounding revealed patient in room. No complaints, stable, in no acute distress. Q15 rounds and monitoring via Engineer, drilling to continue.

## 2020-06-12 NOTE — ED Notes (Signed)
Hourly rounding revealed patient in room. No complaints, stable, In no acute distress. Q15 minute rounds and monitoring via Engineer, drilling to continue.

## 2020-06-15 ENCOUNTER — Emergency Department
Admission: EM | Admit: 2020-06-15 | Discharge: 2020-06-17 | Disposition: A | Discharge status: Home / Self Care | Payer: Medicare Other | Attending: Emergency Medicine | Admitting: Emergency Medicine

## 2020-06-15 ENCOUNTER — Other Ambulatory Visit: Payer: Self-pay

## 2020-06-15 DIAGNOSIS — Y92009 Unspecified place in unspecified non-institutional (private) residence as the place of occurrence of the external cause: Secondary | ICD-10-CM | POA: Diagnosis not present

## 2020-06-15 DIAGNOSIS — Z7982 Long term (current) use of aspirin: Secondary | ICD-10-CM | POA: Diagnosis not present

## 2020-06-15 DIAGNOSIS — R4589 Other symptoms and signs involving emotional state: Secondary | ICD-10-CM

## 2020-06-15 DIAGNOSIS — E119 Type 2 diabetes mellitus without complications: Secondary | ICD-10-CM | POA: Diagnosis not present

## 2020-06-15 DIAGNOSIS — Z79899 Other long term (current) drug therapy: Secondary | ICD-10-CM | POA: Insufficient documentation

## 2020-06-15 DIAGNOSIS — J449 Chronic obstructive pulmonary disease, unspecified: Secondary | ICD-10-CM | POA: Insufficient documentation

## 2020-06-15 DIAGNOSIS — Z7984 Long term (current) use of oral hypoglycemic drugs: Secondary | ICD-10-CM | POA: Insufficient documentation

## 2020-06-15 DIAGNOSIS — I10 Essential (primary) hypertension: Secondary | ICD-10-CM | POA: Diagnosis not present

## 2020-06-15 DIAGNOSIS — R452 Unhappiness: Secondary | ICD-10-CM | POA: Insufficient documentation

## 2020-06-15 DIAGNOSIS — F79 Unspecified intellectual disabilities: Secondary | ICD-10-CM

## 2020-06-15 DIAGNOSIS — M25551 Pain in right hip: Secondary | ICD-10-CM | POA: Diagnosis not present

## 2020-06-15 DIAGNOSIS — F840 Autistic disorder: Secondary | ICD-10-CM | POA: Diagnosis not present

## 2020-06-15 DIAGNOSIS — N400 Enlarged prostate without lower urinary tract symptoms: Secondary | ICD-10-CM | POA: Diagnosis present

## 2020-06-15 LAB — COMPREHENSIVE METABOLIC PANEL
ALT: 8 U/L (ref 0–44)
AST: 21 U/L (ref 15–41)
Albumin: 3.6 g/dL (ref 3.5–5.0)
Alkaline Phosphatase: 112 U/L (ref 38–126)
Anion gap: 10 (ref 5–15)
BUN: 20 mg/dL (ref 8–23)
CO2: 28 mmol/L (ref 22–32)
Calcium: 9 mg/dL (ref 8.9–10.3)
Chloride: 102 mmol/L (ref 98–111)
Creatinine, Ser: 0.89 mg/dL (ref 0.61–1.24)
GFR, Estimated: >60 mL/min (ref >60)
Glucose, Bld: 112 mg/dL — ABNORMAL HIGH (ref 70–99)
Potassium: 4.2 mmol/L (ref 3.5–5.1)
Sodium: 140 mmol/L (ref 135–145)
Total Bilirubin: 0.7 mg/dL (ref 0.3–1.2)
Total Protein: 7.2 g/dL (ref 6.5–8.1)

## 2020-06-15 LAB — URINALYSIS, COMPLETE (UACMP) WITH MICROSCOPIC
Bacteria, UA: NONE SEEN
Bilirubin Urine: NEGATIVE
Glucose, UA: NEGATIVE mg/dL
Hgb urine dipstick: NEGATIVE
Ketones, ur: 5 mg/dL — AB
Leukocytes,Ua: NEGATIVE
Nitrite: NEGATIVE
Protein, ur: NEGATIVE mg/dL
Specific Gravity, Urine: 1.029 (ref 1.005–1.030)
Squamous Epithelial / HPF: NONE SEEN (ref 0–5)
pH: 5 (ref 5.0–8.0)

## 2020-06-15 LAB — URINE DRUG SCREEN, QUALITATIVE (ARMC ONLY)
Amphetamines, Ur Screen: NOT DETECTED
Barbiturates, Ur Screen: NOT DETECTED
Benzodiazepine, Ur Scrn: POSITIVE — AB
Cannabinoid 50 Ng, Ur ~~LOC~~: NOT DETECTED
Cocaine Metabolite,Ur ~~LOC~~: NOT DETECTED
MDMA (Ecstasy)Ur Screen: NOT DETECTED
Methadone Scn, Ur: NOT DETECTED
Opiate, Ur Screen: NOT DETECTED
Phencyclidine (PCP) Ur S: NOT DETECTED
Tricyclic, Ur Screen: POSITIVE — AB

## 2020-06-15 LAB — CBC
HCT: 40.7 % (ref 39.0–52.0)
Hemoglobin: 13.3 g/dL (ref 13.0–17.0)
MCH: 30.5 pg (ref 26.0–34.0)
MCHC: 32.7 g/dL (ref 30.0–36.0)
MCV: 93.3 fL (ref 80.0–100.0)
Platelets: 170 10*3/uL (ref 150–400)
RBC: 4.36 MIL/uL (ref 4.22–5.81)
RDW: 12.6 % (ref 11.5–15.5)
WBC: 7.6 10*3/uL (ref 4.0–10.5)
nRBC: 0 % (ref 0.0–0.2)

## 2020-06-15 LAB — ACETAMINOPHEN LEVEL: Acetaminophen (Tylenol), Serum: <10 ug/mL — ABNORMAL LOW (ref 10–30)

## 2020-06-15 LAB — ETHANOL: Alcohol, Ethyl (B): <10 mg/dL (ref <10)

## 2020-06-15 LAB — SALICYLATE LEVEL: Salicylate Lvl: <7 mg/dL — ABNORMAL LOW (ref 7.0–30.0)
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
## 2020-06-15 NOTE — ED Notes (Signed)
This RN in touch care home staff who states Ms. Alexander Beard is on the way to pick up patient.

## 2020-06-15 NOTE — ED Notes (Signed)
Pt placed for discharge by EDP.  This RN attempted to call group home without answer.

## 2020-06-15 NOTE — ED Notes (Signed)
[facility] employee answered, states unsure if anyone able to come get patient but will make a phone call and hung up.

## 2020-06-15 NOTE — ED Notes (Signed)
This RN in contact with patient's brother with permission from patient to find ride for discharge.  Brother unable to pick patient up d/t recent surgery and health issues.  Will attempt to get in touch with group home with number given by pt's brother. Owner's number [phone].

## 2020-06-15 NOTE — Discharge Instructions (Addendum)
Please continue his medications at home, it is important to stay on top of his medication regimen.   Return to the ED with any further safety concerns.
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
## 2020-06-15 NOTE — ED Notes (Signed)
Pt states he is sad and doesn't want to return to group home, pt no si/ no HI , pt states his brother [name] wants to make patient return to group home . Pt states he does not speak to a therapist. Pt denies mistreatment at group home

## 2020-06-16 DIAGNOSIS — F840 Autistic disorder: Secondary | ICD-10-CM | POA: Diagnosis not present

## 2020-06-16 DIAGNOSIS — R452 Unhappiness: Secondary | ICD-10-CM | POA: Diagnosis not present

## 2020-06-16 MED ORDER — METOPROLOL SUCCINATE ER 50 MG PO TB24
50.0000 mg | ORAL_TABLET | Freq: Every day | ORAL | Status: DC
  Administered 2020-06-16: 50 mg via ORAL
  Filled 2020-06-16: qty 1

## 2020-06-16 MED ORDER — ASPIRIN EC 81 MG PO TBEC
81.0000 mg | DELAYED_RELEASE_TABLET | Freq: Every day | ORAL | Status: DC
  Administered 2020-06-16 – 2020-06-17 (×2): 81 mg via ORAL
  Filled 2020-06-16 (×2): qty 1

## 2020-06-16 MED ORDER — OXYBUTYNIN CHLORIDE 5 MG PO TABS
5.0000 mg | ORAL_TABLET | Freq: Two times a day (BID) | ORAL | Status: DC
  Administered 2020-06-16 – 2020-06-17 (×3): 5 mg via ORAL
  Filled 2020-06-16 (×4): qty 1

## 2020-06-16 MED ORDER — DIVALPROEX SODIUM ER 500 MG PO TB24
1500.0000 mg | ORAL_TABLET | Freq: Every day | ORAL | Status: DC
  Administered 2020-06-16: 1500 mg via ORAL
  Filled 2020-06-16: qty 6

## 2020-06-16 MED ORDER — LAMOTRIGINE 100 MG PO TABS
150.0000 mg | ORAL_TABLET | Freq: Every day | ORAL | Status: DC
  Administered 2020-06-16: 150 mg via ORAL
  Filled 2020-06-16: qty 1

## 2020-06-16 MED ORDER — SIMVASTATIN 40 MG PO TABS
20.0000 mg | ORAL_TABLET | Freq: Every day | ORAL | Status: DC
  Administered 2020-06-16: 20 mg via ORAL
  Filled 2020-06-16: qty 2

## 2020-06-16 MED ORDER — SERTRALINE HCL 50 MG PO TABS
50.0000 mg | ORAL_TABLET | Freq: Two times a day (BID) | ORAL | Status: DC
  Administered 2020-06-16 – 2020-06-17 (×3): 50 mg via ORAL
  Filled 2020-06-16 (×2): qty 1

## 2020-06-16 MED ORDER — QUETIAPINE FUMARATE 25 MG PO TABS
100.0000 mg | ORAL_TABLET | Freq: Four times a day (QID) | ORAL | Status: DC
  Administered 2020-06-16 – 2020-06-17 (×5): 100 mg via ORAL
  Filled 2020-06-16 (×4): qty 4

## 2020-06-16 MED ORDER — CLONAZEPAM 1 MG PO TABS
1.0000 mg | ORAL_TABLET | Freq: Four times a day (QID) | ORAL | Status: DC
  Administered 2020-06-16 – 2020-06-17 (×5): 1 mg via ORAL
  Filled 2020-06-16 (×4): qty 2

## 2020-06-16 MED ORDER — QUETIAPINE FUMARATE 200 MG PO TABS
400.0000 mg | ORAL_TABLET | Freq: Every day | ORAL | Status: DC
  Administered 2020-06-16: 400 mg via ORAL
  Filled 2020-06-16: qty 2

## 2020-06-16 MED ORDER — PANTOPRAZOLE SODIUM 40 MG PO TBEC
40.0000 mg | DELAYED_RELEASE_TABLET | Freq: Every day | ORAL | Status: DC
  Administered 2020-06-16 – 2020-06-17 (×2): 40 mg via ORAL
  Filled 2020-06-16 (×2): qty 1

## 2020-06-16 MED ORDER — SIMETHICONE 80 MG PO CHEW
80.0000 mg | CHEWABLE_TABLET | Freq: Three times a day (TID) | ORAL | Status: DC
  Administered 2020-06-16 – 2020-06-17 (×3): 80 mg via ORAL
  Filled 2020-06-16 (×5): qty 1

## 2020-06-16 MED ORDER — LORAZEPAM 2 MG/ML IJ SOLN
2.0000 mg | Freq: Once | INTRAMUSCULAR | Status: AC
  Administered 2020-06-16: 2 mg via INTRAMUSCULAR
  Filled 2020-06-16: qty 1

## 2020-06-16 MED ORDER — GABAPENTIN 100 MG PO CAPS
100.0000 mg | ORAL_CAPSULE | Freq: Three times a day (TID) | ORAL | Status: DC
  Administered 2020-06-16 – 2020-06-17 (×3): 100 mg via ORAL
  Filled 2020-06-16 (×3): qty 1

## 2020-06-16 MED ORDER — LINAGLIPTIN 5 MG PO TABS
5.0000 mg | ORAL_TABLET | Freq: Every day | ORAL | Status: DC
  Administered 2020-06-17: 5 mg via ORAL
  Filled 2020-06-16 (×3): qty 1

## 2020-06-16 MED ORDER — METFORMIN HCL 850 MG PO TABS
850.0000 mg | ORAL_TABLET | Freq: Two times a day (BID) | ORAL | Status: DC
  Administered 2020-06-16 – 2020-06-17 (×2): 850 mg via ORAL
  Filled 2020-06-16 (×4): qty 1

## 2020-06-16 MED ORDER — LORAZEPAM 2 MG PO TABS
2.0000 mg | ORAL_TABLET | Freq: Four times a day (QID) | ORAL | Status: DC | PRN
  Administered 2020-06-16: 2 mg via ORAL
  Filled 2020-06-16: qty 1

## 2020-06-16 MED ORDER — LORATADINE 10 MG PO TABS
10.0000 mg | ORAL_TABLET | Freq: Every day | ORAL | Status: DC
  Administered 2020-06-16 – 2020-06-17 (×2): 10 mg via ORAL
  Filled 2020-06-16 (×2): qty 1

## 2020-06-16 MED ORDER — TAMSULOSIN HCL 0.4 MG PO CAPS
0.4000 mg | ORAL_CAPSULE | Freq: Every day | ORAL | Status: DC
  Administered 2020-06-16: 0.4 mg via ORAL
  Filled 2020-06-16: qty 1

## 2020-06-16 MED ORDER — PRAZOSIN HCL 1 MG PO CAPS
1.0000 mg | ORAL_CAPSULE | Freq: Every day | ORAL | Status: DC
  Administered 2020-06-16: 1 mg via ORAL
  Filled 2020-06-16 (×2): qty 1
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
## 2020-06-16 NOTE — ED Notes (Signed)
Drink was given to Pt.

## 2020-06-16 NOTE — ED Notes (Signed)
Alexander Beard, staff at care home, that this RN and charge have been in contact with was contacted after new phone number didn't work.  Alexander Beard states now to call back at 0830 to talk to the owner because they're not sure if they will take the patient back.  When asked what the owner's phone number is Alexander Beard doesn't know it.  When asked how they get in contact with the owner he also states they just wait until he comes in.  When asked why Alexander Beard would not pick up since he claims just speaking to her he would not give an answer and would not give a number that worked or have her call this Therapist, sports.  When told this would need to be reported as abandonment the staff member said "that's fine" and hung up.

## 2020-06-16 NOTE — ED Notes (Addendum)
[facility] home called spoke with Alexander Beard whom said he is an SIC there. As per Alexander Beard has made arrangement to have Alexander Beard picked. Alexander Beard was not able to provided an ETA, Request to speak to owner or manager of group home initiated, as per [name] he is unable to provide a contact number but will call Alexander Beard to make him aware that writer has been trying to get in touch with him. Mr. Alexander Beard made aware that patient was discharged 0900 on 06/15/20 and it is now close to  24 hours since patient has been discharge with no ETA or contact from facility. Patients brother has been contacted at [phone] -  [phone]), brother reports he is unable to come pick him up due to recent surgery, requested I contact facility and have him picked, brother provided Alexander Beard # [phone]) whom is the owner of facility. Alexander Beard has been called with no answer on multiple calls. Will attempt to call facility and involve social worker for assistance. Number that have been called throughout night for patient pick up due to being discharged. [phone]  (2) [phone]   (3) [phone]  (4) [phone]  [phone]) [phone]

## 2020-06-16 NOTE — ED Notes (Signed)
Given phone, speaking to brother at this time.

## 2020-06-16 NOTE — ED Notes (Signed)
Pt given a [place] sandwich tray and a juice.

## 2020-06-16 NOTE — ED Notes (Signed)
Number for Alexander Beard [phone], given to this RN by group home staff.  Number rings and automatically hangs up.  Multiple attempts to call number made.

## 2020-06-16 NOTE — ED Notes (Addendum)
Pt upset, yelling. Advised that he cannot yell, and needs to lower his voice. Pt refuses to go back to group home. This RN mentioned that she would speak to appropriate discharge planning people to share his concerns, and that he could call his brother if it would make him feel better. Given diet coke

## 2020-06-16 NOTE — Consult Note (Signed)
  Updated note to clarify question of capacity.  Based on my knowledge of this patient over many years in multiple settings as well as his current mental state and behavior I can say that in my opinion he lacks capacity to make appropriate decisions for himself particularly regarding living situation.  He is unable to rationally process his options or acknowledge his medical needs.  He is unable to engage in a reasonable conversation about discharge planning and this is all the result of his chronic mental condition.  It is very unlikely to improve at any point regardless of treatment.

## 2020-06-16 NOTE — Consult Note (Signed)
[facility] Consult   Reason for Consult: Consult for 64 year old man with a history of autism and chronic behavior problems now back in the emergency room Referring Physician: Tamala Julian Patient Identification: Alexander Beard MRN:  [id number] Principal Diagnosis: Active autistic disorder Diagnosis:  Principal Problem:   Active autistic disorder Active Problems:   Diabetes ([place])   Hypertension   Prostate hypertrophy   Intellectual disability   Total Time spent with patient: 1 hour  Subjective:   Alexander Beard is a 64 y.o. male patient admitted with "do not send me to those places or you will ruin my life".  HPI: Patient seen chart reviewed.  Patient very familiar from many past encounters.  64 year old man with a history of autistic spectrum disorder behavior problems multiple medical issues possible schizoaffective disorder.  Patient was brought back to the emergency room last night after getting into a conflict with another resident at his group home.  He was seen last night in evaluation and was not thought to need hospital level care and was recommended for discharge back home.  So far no one from his group home has been able to pick him up.  Today he is escalating.  Became agitated this morning shouting in the emergency room unresponsive to attempts to calm him and reason with him.  Insisting that if he goes back to the group home it will "ruin my life".  Talking about how he wishes he would die although I do not think that there is any intention that he is going to actually harm himself.  Past Psychiatric History: Lifelong history of behavior problems mild intellectual disability impulsiveness behaviors which to me are all very characteristic of autistic spectrum disorder which is why I have consider that his primary diagnosis although historically he had previously been considered to have schizoaffective disorder.  Great deal of frustration intolerance.  No history of actual  attempts at self-harm but when panicking will put himself in situations that could be dangerous such as running out into the road.  Does not get violent with others but his behavior is extremely disruptive.  Risk to Self:   Risk to Others:   Prior Inpatient Therapy:   Prior Outpatient Therapy:    Past Medical History:  Past Medical History:  Diagnosis Date  . Anxiety   . Anxiety disorder due to known physiological condition    HOSPITALIZED 10/18  . Arthritis   . Autistic disorder, residual state   . COPD (chronic obstructive pulmonary disease) ([place])   . Depression   . Developmental disorder   . Diabetes mellitus without complication ([place])   . Dyslipidemia   . Esophageal reflux   . HOH (hard of hearing)    MILDLY  . Hypertension   . Obesity   . Overactive bladder   . Palpitations    ANXIETY  . Schizophrenia, schizoaffective ([place])   . Sleep apnea   . Tremors of nervous system    HANDS DUE TO MEDICATIONS  . Urinary incontinence     Past Surgical History:  Procedure Laterality Date  . CATARACT EXTRACTION W/PHACO Left 11/14/2017   Procedure: CATARACT EXTRACTION PHACO AND INTRAOCULAR LENS PLACEMENT (IOC);  Surgeon: Birder Robson, MD;  Location: [facility] ORS;  Service: Ophthalmology;  Laterality: Left;  [place] 00:42 AP% 14.6 CDE 6.12 Fluid pack lot # [id number]  . COLONOSCOPY  01/28/2005  . COLONOSCOPY WITH PROPOFOL N/A 05/09/2016   Procedure: COLONOSCOPY WITH PROPOFOL;  Surgeon: Manya Silvas, MD;  Location[facility]  ENDOSCOPY;  Service: Endoscopy;  Laterality: N/A;  . ESOPHAGOGASTRODUODENOSCOPY N/A 05/09/2016   Procedure: ESOPHAGOGASTRODUODENOSCOPY (EGD);  Surgeon: Manya Silvas, MD;  Location: [facility] ENDOSCOPY;  Service: Endoscopy;  Laterality: N/A;  . FRACTURE SURGERY     ORIF SHOULDER  . TONSILLECTOMY     Family History:  Family History  Problem Relation Age of Onset  . Hypertension Mother   . Stroke Father   . Heart Problems Father    Family Psychiatric  History:  None Social History:  Social History   Substance and Sexual Activity  Alcohol Use No  . Alcohol/week: 0.0 standard drinks     Social History   Substance and Sexual Activity  Drug Use No    Social History   Socioeconomic History  . Marital status: Single    Spouse name: Not on file  . Number of children: Not on file  . Years of education: Not on file  . Highest education level: Not on file  Occupational History  . Not on file  Tobacco Use  . Smoking status: Never Smoker  . Smokeless tobacco: Never Used  Vaping Use  . Vaping Use: Never used  Substance and Sexual Activity  . Alcohol use: No    Alcohol/week: 0.0 standard drinks  . Drug use: No  . Sexual activity: Never  Other Topics Concern  . Not on file  Social History Narrative   The patient never finished high school but did get his GED. 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 He is currently in disability and his brother Alexander Beard is his legal guardian. He has been living in group homes for many years.      No pending legal charges   Social Determinants of Health   Financial Resource Strain:   . Difficulty of Paying Living Expenses: Not on file  Food Insecurity:   . Worried About Charity fundraiser in the Last Year: Not on file  . Ran Out of Food in the Last Year: Not on file  Transportation Needs:   . Lack of Transportation (Medical): Not on file  . Lack of Transportation (Non-Medical): Not on file  Physical Activity:   . Days of Exercise per Week: Not on file  . Minutes of Exercise per Session: Not on file  Stress:   . Feeling of Stress : Not on file  Social Connections:   . Frequency of Communication with Friends and Family: Not on file  . Frequency of Social Gatherings with Friends and Family: Not on file  . Attends Religious Services: Not on file  . Active Member of Clubs or Organizations: Not on file  . Attends Archivist Meetings:  Not on file  . Marital Status: Not on file   Additional Social History:    Allergies:   Allergies  Allergen Reactions  . Other Other (See Comments)    MYACINS  . Penicillins Nausea And Vomiting  . Cortizone-10 [Hydrocortisone] Rash    Labs:  Results for orders placed or performed during the hospital encounter of 06/15/20 (from the past 48 hour(s))  Comprehensive metabolic panel     Status: Abnormal   Collection Time: 06/15/20  7:19 PM  Result Value Ref Range   Sodium 140 135 - 145 mmol/L   Potassium 4.2 3.5 - 5.1 mmol/L   Chloride 102 98 - 111 mmol/L   CO2 28 22 - 32 mmol/L   Glucose, Bld 112 (  H) 70 - 99 mg/dL    Comment: Glucose reference range applies only to samples taken after fasting for at least 8 hours.   BUN 20 8 - 23 mg/dL   Creatinine, Ser 0.89 0.61 - 1.24 mg/dL   Calcium 9.0 8.9 - 10.3 mg/dL   Total Protein 7.2 6.5 - 8.1 g/dL   Albumin 3.6 3.5 - 5.0 g/dL   AST 21 15 - 41 U/L   ALT 8 0 - 44 U/L   Alkaline Phosphatase 112 38 - 126 U/L   Total Bilirubin 0.7 0.3 - 1.2 mg/dL   GFR, Estimated >60 >60 mL/min    Comment: (NOTE) Calculated using the CKD-EPI Creatinine Equation (2021)    Anion gap 10 5 - 15    Comment: Performed at [facility], [address] [place], [place] [address]  Ethanol     Status: None   Collection Time: 06/15/20  7:19 PM  Result Value Ref Range   Alcohol, Ethyl (B) <10 <10 mg/dL    Comment: (NOTE) Lowest detectable limit for serum alcohol is 10 mg/dL.  For medical purposes only. Performed at [facility], [address] [place], [place] [address]   Salicylate level     Status: Abnormal   Collection Time: 06/15/20  7:19 PM  Result Value Ref Range   Salicylate Lvl <6.7 (L) 7.0 - 30.0 mg/dL    Comment: Performed at [facility], [address] [place], [place] [address]  Acetaminophen level     Status: Abnormal   Collection Time: 06/15/20  7:19 PM  Result Value Ref Range   Acetaminophen (Tylenol),  Serum <10 (L) 10 - 30 ug/mL    Comment: (NOTE) Therapeutic concentrations vary significantly. A range of 10-30 ug/mL  may be an effective concentration for many patients. However, some  are best treated at concentrations outside of this range. Acetaminophen concentrations >150 ug/mL at 4 hours after ingestion  and >50 ug/mL at 12 hours after ingestion are often associated with  toxic reactions.  Performed at [facility], [address] [place], [place] [address]   cbc     Status: None   Collection Time: 06/15/20  7:19 PM  Result Value Ref Range   WBC 7.6 4.0 - 10.5 K/uL   RBC 4.36 4.22 - 5.81 MIL/uL   Hemoglobin 13.3 13.0 - 17.0 g/dL   HCT 40.7 39 - 52 %   MCV 93.3 80.0 - 100.0 fL   MCH 30.5 26.0 - 34.0 pg   MCHC 32.7 30.0 - 36.0 g/dL   RDW 12.6 11.5 - 15.5 %   Platelets 170 150 - 400 K/uL   nRBC 0.0 0.0 - 0.2 %    Comment: Performed at [facility], [address], [place], [place] [address]  Urine Drug Screen, Qualitative     Status: Abnormal   Collection Time: 06/15/20  7:19 PM  Result Value Ref Range   Tricyclic, Ur Screen POSITIVE (A) NONE DETECTED   Amphetamines, Ur Screen NONE DETECTED NONE DETECTED   MDMA (Ecstasy)Ur Screen NONE DETECTED NONE DETECTED   Cocaine Metabolite,Ur [place] NONE DETECTED NONE DETECTED   Opiate, Ur Screen NONE DETECTED NONE DETECTED   Phencyclidine (PCP) Ur S NONE DETECTED NONE DETECTED   Cannabinoid 50 Ng, Ur [place] NONE DETECTED NONE DETECTED   Barbiturates, Ur Screen NONE DETECTED NONE DETECTED   Benzodiazepine, Ur Scrn POSITIVE (A) NONE DETECTED   Methadone Scn, Ur NONE DETECTED NONE DETECTED    Comment: (NOTE)  Tricyclics + metabolites, urine    Cutoff 1000 ng/mL Amphetamines + metabolites, urine  Cutoff 1000 ng/mL MDMA (Ecstasy), urine              Cutoff 500 ng/mL Cocaine Metabolite, urine          Cutoff 300 ng/mL Opiate + metabolites, urine        Cutoff 300 ng/mL Phencyclidine (PCP), urine         Cutoff 25  ng/mL Cannabinoid, urine                 Cutoff 50 ng/mL Barbiturates + metabolites, urine  Cutoff 200 ng/mL Benzodiazepine, urine              Cutoff 200 ng/mL Methadone, urine                   Cutoff 300 ng/mL  The urine drug screen provides only a preliminary, unconfirmed analytical test result and should not be used for non-medical purposes. Clinical consideration and professional judgment should be applied to any positive drug screen result due to possible interfering substances. A more specific alternate chemical method must be used in order to obtain a confirmed analytical result. Gas chromatography / mass spectrometry (GC/MS) is the preferred confirm atory method. Performed at [facility], [address] [place], [place] [address]   Urinalysis, Complete w Microscopic     Status: Abnormal   Collection Time: 06/15/20  7:19 PM  Result Value Ref Range   Color, Urine YELLOW (A) YELLOW   APPearance CLEAR (A) CLEAR   Specific Gravity, Urine 1.029 1.005 - 1.030   pH 5.0 5.0 - 8.0   Glucose, UA NEGATIVE NEGATIVE mg/dL   Hgb urine dipstick NEGATIVE NEGATIVE   Bilirubin Urine NEGATIVE NEGATIVE   Ketones, ur 5 (A) NEGATIVE mg/dL   Protein, ur NEGATIVE NEGATIVE mg/dL   Nitrite NEGATIVE NEGATIVE   Leukocytes,Ua NEGATIVE NEGATIVE   RBC / HPF 0-5 0 - 5 RBC/hpf   WBC, UA 0-5 0 - 5 WBC/hpf   Bacteria, UA NONE SEEN NONE SEEN   Squamous Epithelial / LPF NONE SEEN 0 - 5   Mucus PRESENT     Comment: Performed at [facility], [address] [place], [place] [address]    Current Facility-Administered Medications  Medication Dose Route Frequency Provider Last Rate Last Admin  . aspirin EC tablet 81 mg  81 mg Oral Daily Bailley Guilford T, MD      . clonazePAM (KLONOPIN) tablet 1 mg  1 mg Oral QID Zayvion Stailey T, MD      . divalproex (DEPAKOTE ER) 24 hr tablet 1,500 mg  1,500 mg Oral QHS Mccoy Testa T, MD      . gabapentin (NEURONTIN) capsule 100 mg  100 mg Oral TID  Paloma Grange T, MD      . lamoTRIgine (LAMICTAL) tablet 150 mg  150 mg Oral QHS Charmian Forbis T, MD      . linagliptin (TRADJENTA) tablet 5 mg  5 mg Oral Daily Jadzia Ibsen T, MD      . loratadine (CLARITIN) tablet 10 mg  10 mg Oral Daily Nelle Sayed T, MD      . LORazepam (ATIVAN) tablet 2 mg  2 mg Oral Q6H PRN Stephanne Greeley T, MD      . metFORMIN (GLUCOPHAGE) tablet 850 mg  850 mg Oral BID WC Canio Winokur T, MD      . metoprolol succinate (TOPROL-XL) 24 hr tablet  50 mg  50 mg Oral Daily Nazanin Kinner T, MD      . oxybutynin (DITROPAN) tablet 5 mg  5 mg Oral BID Osker Ayoub T, MD      . pantoprazole (PROTONIX) EC tablet 40 mg  40 mg Oral Daily Virgal Warmuth T, MD      . prazosin (MINIPRESS) capsule 1 mg  1 mg Oral QHS Tattiana Fakhouri T, MD      . QUEtiapine (SEROQUEL) tablet 100 mg  100 mg Oral QID Terisha Losasso T, MD      . QUEtiapine (SEROQUEL) tablet 400 mg  400 mg Oral QHS Beauty Pless T, MD      . sertraline (ZOLOFT) tablet 50 mg  50 mg Oral BID Aijalon Kirtz T, MD      . simethicone (MYLICON) chewable tablet 80 mg  80 mg Oral TID Annaleah Arata T, MD      . simvastatin (ZOCOR) tablet 20 mg  20 mg Oral q1800 Athaliah Baumbach T, MD      . tamsulosin (FLOMAX) capsule 0.4 mg  0.4 mg Oral QPC supper Valerian Jewel, [name], MD       Current Outpatient Medications  Medication Sig Dispense Refill  . acetaminophen (TYLENOL) 650 MG CR tablet Take 650 mg by mouth every 12 (twelve) hours as needed for pain.    Marland Kitchen aspirin EC 81 MG EC tablet Take 1 tablet (81 mg total) by mouth daily. Swallow whole. 90 tablet 1  . clonazePAM (KLONOPIN) 1 MG tablet Take 1 tablet (1 mg total) by mouth 4 (four) times daily. 360 tablet 1  . divalproex (DEPAKOTE ER) 500 MG 24 hr tablet Take 3 tablets (1,500 mg total) by mouth at bedtime. 270 tablet 1  . gabapentin (NEURONTIN) 100 MG capsule Take 1 capsule (100 mg total) by mouth 3 (three) times daily. 270 capsule 1  . hydrOXYzine (ATARAX/VISTARIL) 10 MG tablet Take 1 tablet  (10 mg total) by mouth 3 (three) times daily as needed for anxiety. 270 tablet 1  . lamoTRIgine (LAMICTAL) 150 MG tablet Take 1 tablet (150 mg total) by mouth at bedtime. 90 tablet 1  . linagliptin (TRADJENTA) 5 MG TABS tablet Take 1 tablet (5 mg total) by mouth daily. 90 tablet 1  . loratadine (CLARITIN) 10 MG tablet Take 1 tablet (10 mg total) by mouth daily. 90 tablet 1  . LORazepam (ATIVAN) 2 MG tablet Take 1 tablet (2 mg total) by mouth every 6 (six) hours as needed (severe anxiety). 180 tablet 1  . metFORMIN (GLUCOPHAGE) 850 MG tablet Take 1 tablet (850 mg total) by mouth 2 (two) times daily with a meal. 180 tablet 1  . metoprolol succinate (TOPROL-XL) 50 MG 24 hr tablet Take 1 tablet (50 mg total) by mouth daily. 90 tablet 1  . oxybutynin (DITROPAN) 5 MG tablet Take 2 tablets (10 mg total) by mouth 2 (two) times daily. 180 tablet 1  . pantoprazole (PROTONIX) 40 MG tablet Take 1 tablet (40 mg total) by mouth daily. 90 tablet 1  . prazosin (MINIPRESS) 1 MG capsule Take 1 capsule (1 mg total) by mouth at bedtime. 90 capsule 1  . QUEtiapine (SEROQUEL) 100 MG tablet Take 1 tablet (100 mg total) by mouth 4 (four) times daily. 360 tablet 1  . QUEtiapine (SEROQUEL) 400 MG tablet Take 1 tablet (400 mg total) by mouth at bedtime. 90 tablet 1  . sertraline (ZOLOFT) 50 MG tablet Take 1 tablet (50 mg total) by mouth 2 (two) times  daily. 180 tablet 1  . simethicone (MYLICON) 80 MG chewable tablet Chew 1 tablet (80 mg total) by mouth 3 (three) times daily. 270 tablet 1  . simvastatin (ZOCOR) 20 MG tablet Take 1 tablet (20 mg total) by mouth daily at 6 PM. 90 tablet 1  . tamsulosin (FLOMAX) 0.4 MG CAPS capsule Take 2 capsules (0.8 mg total) by mouth daily after supper. 90 capsule 1    Musculoskeletal: Strength & Muscle Tone: within normal limits Gait & Station: normal Patient leans: N/A  Psychiatric Specialty Exam: Physical Exam Vitals and nursing note reviewed.  Constitutional:      Appearance: He  is well-developed.  HENT:     Head: Normocephalic and atraumatic.  Eyes:     Conjunctiva/sclera: Conjunctivae normal.     Pupils: Pupils are equal, round, and reactive to light.  Cardiovascular:     Heart sounds: Normal heart sounds.  Pulmonary:     Effort: Pulmonary effort is normal.  Abdominal:     Palpations: Abdomen is soft.  Musculoskeletal:        General: Normal range of motion.     Cervical back: Normal range of motion.  Skin:    General: Skin is warm and dry.  Neurological:     General: No focal deficit present.     Mental Status: He is alert.  Psychiatric:        Attention and Perception: He is inattentive.        Mood and Affect: Mood is anxious. Affect is labile and inappropriate.        Speech: He is noncommunicative. Speech is rapid and pressured.        Behavior: Behavior is agitated.        Thought Content: Thought content includes suicidal ideation. Thought content does not include homicidal ideation. Thought content does not include suicidal plan.        Cognition and Memory: Cognition is impaired.        Judgment: Judgment is impulsive and inappropriate.     Review of Systems  Constitutional: Negative.   HENT: Negative.   Eyes: Negative.   Respiratory: Negative.   Cardiovascular: Negative.   Gastrointestinal: Negative.   Musculoskeletal: Negative.   Skin: Negative.   Neurological: Negative.   Psychiatric/Behavioral: Positive for behavioral problems. The patient is nervous/anxious.     Blood pressure 132/77, pulse (!) 46, temperature 98.9 F (37.2 C), temperature source Oral, resp. rate 16, height 5\' 8"  (1.727 m), weight 104.3 kg, SpO2 100 %.Body mass index is 34.96 kg/m.  General Appearance: Disheveled  Eye Contact:  Fair  Speech:  Garbled  Volume:  Increased  Mood:  Angry, Dysphoric and Irritable  Affect:  Inappropriate and Labile  Thought Process:  Disorganized  Orientation:  Full (Time, Place, and Person)  Thought Content:  Illogical   Suicidal Thoughts:  Yes.  without intent/plan  Homicidal Thoughts:  No  Memory:  Immediate;   Fair Recent;   Poor Remote;   Fair  Judgement:  Impaired  Insight:  Shallow  Psychomotor Activity:  Restlessness  Concentration:  Concentration: Fair  Recall:  [employer] of Knowledge:  Fair  Language:  Fair  Akathisia:  No  Handed:  Right  AIMS (if indicated):     Assets:  Resilience  ADL's:  Impaired  Cognition:  Impaired,  Mild  Sleep:        Treatment Plan Summary: Daily contact with patient to assess and evaluate symptoms and progress in treatment, Medication management  and Plan Partially his acting out this morning is probably the result of not getting any of his medicine overnight and this morning.  Partially of course that is just his normal behavior especially in the difficult situation of being in an exposed part of the emergency room.  Hopefully we will still be able to get him back to his group home although honestly I am starting to wonder if this is going to work out long-term.  I have restarted all of his outpatient medicines and gave him a single dose of IM Ativan today.  If he comes down we can try and get him back to the group home.  Patient is not appropriate for inpatient psychiatric admission.  Disposition: Patient does not meet criteria for psychiatric inpatient admission. Supportive therapy provided about ongoing stressors. Discussed crisis plan, support from social network, calling 911, coming to the Emergency Department, and calling Suicide Hotline.  Alethia Berthold, MD 06/16/2020 11:07 AM

## 2020-06-16 NOTE — ED Notes (Signed)
Report received from [place], RN including Situation, Background, Assessment, and Recommendations. Patient alert and oriented, warm and dry, and in no acute distress. Patient denies SI, HI, AVH and pain. Patient made aware of Q15 minute rounds and Engineer, drilling presence for their safety. Patient instructed to come to this nurse with needs or concerns.

## 2020-06-16 NOTE — ED Notes (Signed)
Pt coloring and drawing pictures in room. Pt expresses uncertainty on how to properly organize and hold all pieces of paper and two books in hand at same time without dropping or damaging paper. Pt Requests assistance with organization, provided by this nurse but pt uninterested in advice. Will continue to monitor.

## 2020-06-16 NOTE — ED Notes (Signed)
Hourly rounding completed at this time, patient currently awake in room. No complaints, stable, and in no acute distress. Q15 minute rounds and monitoring via Rover and Officer to continue. °

## 2020-06-16 NOTE — ED Notes (Signed)
This RN and charge back in touch with group home.  States he got in touch with someone but it's now too late for anyone to come.  Lorriane Shire, RN attempting to get a hold of group home owner.  Group home representative to call directly back by [phone].

## 2020-06-16 NOTE — TOC Progression Note (Signed)
Transition of Care [facility]) - Progression Note    Patient Details  Name: MARCELLUS PULLIAM MRN: [id number] Date of Birth: 11-21-1955  Transition of [facility]) CM/SW [place], [place] Phone Number: [phone] 06/16/2020, 11:36 AM  Clinical Narrative:     CSW spoke with patient's brother Lelan Cush about patient's disposition.  Mr. Delacruz mentioned to this CSW the patient does NOT have guardian and they have managed "because until this moment [facility] has complied with our requests."  CSW stated w/out a guardian the patient makes decisions for himself including leaving the group home any time he wants.  The patient stated he wanted to leave his group home yesterday and is back in the ED. Mr. Firkus stated the patient, "cannoe go to a homeless shelter because he would not be safe there."  CSW recommended to Mr. Savoca he take the patient back to his home, but Mr. Chait stated he would not be able to because he was recovery from surgery and could barely walk.  CSW stated I would need to contact APS and inquire about guardianship for the patient due to is lack of capacity.  Mr. Dicenzo stated understanding and requested updates.  CSW confirmed I would contact him with updates on patient disposition.        Expected Discharge Plan and Services                                                 Social Determinants of Health (SDOH) Interventions    Readmission Risk Interventions No flowsheet data found.

## 2020-06-16 NOTE — ED Notes (Signed)
Pt. Got a snack and a drink.

## 2020-06-16 NOTE — ED Notes (Signed)
Pt stated to this tech "I am scared if they send me to the same group home I am going to get upset again. I do not belong in that group home. I am afraid if I go back there then I am going to end up back here before in the morning. My doctor needs to know this." Pt also stating "that officer over there is using bad language and talking about me." This tech did not hear any bad language nor did I hear any officer talking about pt.

## 2020-06-16 NOTE — ED Notes (Signed)
While pt on the phone with [name], pt got upset and started to yell and threw the phone. Pt advised he needed to calm down and stop yelling. Pt insist that he is telling the truth and that he can not go back to the group home. Pt keeps stating "Dr.[name] gave me three options. And he said that every time I come it will get worse and worse for me. I am just ready to die."

## 2020-06-16 NOTE — ED Notes (Signed)
Pt has received dinner tray. Placed between legs of pt.  Pt is resting at this time.  Pt is calm and notably sleeping.   lw edt

## 2020-06-16 NOTE — ED Notes (Signed)
Pt moved from hallway to room 22.  Pt showered and changed into hospital scrubs.   lw edt

## 2020-06-17 DIAGNOSIS — R452 Unhappiness: Secondary | ICD-10-CM | POA: Diagnosis not present

## 2020-06-17 NOTE — ED Notes (Signed)
Pt awake and to restroom, pt then requests to turn light in room on. Pt informed that it is night and recommended to get more sleep. Pt states he did not know the time and wanted to read his books because he woke up and felt well rested. Pt states he will return to bed and attempt to get more sleep, pt had only slept for approx 1.5 hours.

## 2020-06-17 NOTE — ED Provider Notes (Signed)
Emergency Medicine Observation Re-evaluation Note  Alexander Beard is a 64 y.o. male, seen on rounds today.  Pt initially presented to the ED for complaints of Altered Mental Status and Placement Currently, the patient is resting comfortably.  Physical Exam  BP 93/64   Pulse (!) 52   Temp 99 F (37.2 C) (Oral)   Resp 20   Ht 5\' 8"  (1.727 m)   Wt 104.3 kg   SpO2 100%   BMI 34.96 kg/m  Physical Exam General: No acute distress Cardiac: Well-perfused extremities Lungs: No respiratory distress Psych: Appropriate mood and affect  ED Course / MDM  EKG:    I have reviewed the labs performed to date as well as medications administered while in observation.  Recent changes in the last 24 hours include none.  Plan  Current plan is for placement. Patient is not under full IVC at this time.   Naaman Plummer, MD 06/17/20 [phone]

## 2020-06-17 NOTE — ED Notes (Signed)
Pt now ambulates to restroom

## 2020-06-17 NOTE — ED Notes (Signed)
Hourly rounding completed at this time, patient currently asleep in room. No complaints, stable, and in no acute distress. Q15 minute rounds and monitoring via Rover and Officer to continue. 

## 2020-06-17 NOTE — ED Notes (Signed)
Pt transferred into ED BHU room 3  Patient assigned to appropriate care area. Patient oriented to unit/care area: Informed that, for his safety, care areas are designed for safety and monitored by security cameras at all times; Visiting hours and phone times explained to patient. Patient verbalizes understanding, and verbal contract for safety obtained.   Assessment completed  He denies pain

## 2020-06-17 NOTE — TOC Transition Note (Signed)
Transition of Care [facility]) - CM/SW Discharge Note   Patient Details  Name: Alexander Beard MRN: [id number] Date of Birth: [birth year]  Transition of Care [facility]) CM/SW Contact:  Victorino Dike, RN Phone Number: 06/17/2020, 4:03 PM   Clinical Narrative:     Spoke with [name] Maria Clayton [phone].  She has spoken with brother [name] and has contacted [facility] group [facility].  Patient will be returning to [place] group home.  Safe transport has been set up to transport patient to group home this evening.    Final next level of care: Group Home Barriers to Discharge: No Barriers Identified   Patient Goals and CMS Choice        Discharge Placement                    Patient and family notified of of transfer: 06/17/20  Discharge Plan and Services                                     Social Determinants of Health (SDOH) Interventions     Readmission Risk Interventions No flowsheet data found.

## 2020-06-17 NOTE — ED Notes (Signed)
Report given to [name], RN.

## 2020-06-17 NOTE — ED Notes (Signed)
Hourly rounding completed at this time, patient currently awake in room. No complaints, stable, and in no acute distress. Q15 minute rounds and monitoring via Rover and Officer to continue. °

## 2020-06-17 NOTE — ED Notes (Signed)
Pt asleep at this time, unable to collect vitals. Will collect pt vitals once awake. 

## 2020-06-17 NOTE — ED Notes (Addendum)
Pt found in room on safety round sitting in floor. Pt states that he got up and sat in floor to work on his drawings and reading. Pt repeatedly states he did not fall and was not hurt. Pt assisted back to bed by this nurse, Seth Bake, [place], [place], [place]. Pt states that because he just learned it was so early he would get more rest before working on his drawing and reading. Dr. Karma Greaser and Roxanne Mins, Charge RN notified of situation. Assisted with blanket placement and comfort measures. Will continue to monitor.

## 2020-06-18 NOTE — TOC Progression Note (Signed)
Transition of Care [facility]) - Progression Note    Patient Details  Name: Alexander Beard MRN: [id number] Date of Birth: 12/13/1955  Transition of Care [facility]) CM/SW [place], [place] Phone Number:  06/18/2020, 9:36 AM  Clinical Narrative:     CSW spoke with patient's brother [name] about patient disposition.  [name] stated he did want APS to become the patient's guardian and would prefer for a family member to do it.  CSW stated it was important for the patient to have a guardian to assist with making decisions because the patient did not have capacity to do it for himself.  [name] stated he did not want APS to be the patient's guardian and that a family member would do it.  CSW stated it was imperative for [name] to find out who would be the patient's guardian, so when APS came to assess the patient they would know who to speak with.  [name] verbalized understanding. [name] stated the patient has always made his won decisions and has been guided by the recommendations of his family and he did not understanding why the patient could not continue to do so.  CSW explain the psychiatrist had assessed the patient and included in the patient's chart specifically stating the patient did have capacity to make his own decisions.  [name] asked the CSW if that note has been actually written by [name].  CSW confirmed the note has been written by [name] this morning.  CSW also reminded [name] he stated to the CSW earlier this morning the patient would not be safe at a hotel or homeless shelter because he was unable to make clear decisions for himself.  [name] verbalized understanding.  CSW stated I would speak with the patient and ask him to return to the group home and I would update [name] on the patient's response. CSW reminded [name] the patient could not stay in the Ed for an extended period of time.  [name] verbalized understanding.    Barriers to  Discharge: No Barriers Identified  Expected Discharge Plan and Services                                                 Social Determinants of Health (SDOH) Interventions    Readmission Risk Interventions No flowsheet data found.

## 2020-06-18 NOTE — TOC Progression Note (Signed)
Transition of Care [facility]) - Progression Note    Patient Details  Name: Alexander LICHTY MRN: [id number] Date of Birth: 19-Apr-1956  Transition of Care [facility]) CM/SW [place] Phone Number: [phone] 06/18/2020, 9:33 AM  Clinical Narrative:     CSW spoke with the patient an length about returning to the group home and reminding that he could not run away from the group and he needed to try to give it a chance for him to get used to.  Patient verbalized understanding and spoke about the things he wanted for himself in the group home, including his watching his regular shows, being able to work and hs drawing supplies.  CSW stated she would speak with patient's brother [name] about his requests.  Patient verbalized understanding.     Barriers to Discharge: No Barriers Identified  Expected Discharge Plan and Services                                                 Social Determinants of Health (SDOH) Interventions    Readmission Risk Interventions No flowsheet data found.

## 2020-06-22 ENCOUNTER — Ambulatory Visit: Payer: Medicare Other | Admitting: Internal Medicine

## 2020-06-30 ENCOUNTER — Encounter: Payer: Self-pay | Admitting: Psychiatry

## 2020-06-30 ENCOUNTER — Other Ambulatory Visit: Payer: Self-pay

## 2020-06-30 ENCOUNTER — Telehealth (INDEPENDENT_AMBULATORY_CARE_PROVIDER_SITE_OTHER): Payer: Medicare Other | Admitting: Psychiatry

## 2020-06-30 DIAGNOSIS — F259 Schizoaffective disorder, unspecified: Secondary | ICD-10-CM

## 2020-06-30 DIAGNOSIS — F840 Autistic disorder: Secondary | ICD-10-CM

## 2020-06-30 MED ORDER — QUETIAPINE FUMARATE 100 MG PO TABS: 100.0000 mg | ORAL_TABLET | Freq: Three times a day (TID) | ORAL | 1 refills | Status: AC

## 2020-06-30 MED ORDER — QUETIAPINE FUMARATE 100 MG PO TABS: 100.0000 mg | ORAL_TABLET | Freq: Three times a day (TID) | ORAL | 1 refills | Status: DC

## 2020-06-30 MED ORDER — CLONAZEPAM 2 MG PO TABS: 1.0000 mg | ORAL_TABLET | Freq: Four times a day (QID) | ORAL | 1 refills | Status: DC

## 2020-06-30 NOTE — Progress Notes (Signed)
Virtual Visit via Telephone Note  I connected with Alexander Beard on 06/30/20 at  1:00 PM EST by telephone and verified that I am speaking with the correct person using two identifiers.  Location: Patient:   Group home Provider: Hospital   I discussed the limitations, risks, security and privacy concerns of performing an evaluation and management service by telephone and the availability of in person appointments. I also discussed with the patient that there may be a patient responsible charge related to this service. The patient expressed understanding and agreed to proceed.   History of Present Illness: Reach patient by telephone.  Also spoke with manager of group home.  Patient says he is not happy believes she is not freaking out threatening or talking about suicide or how everything is going to kill him.  Group home manager reports that he still cries a lot but behavior has been a little more manageable than it was when he first got there    Observations/Objective: Alexander Beard actually sounded pretty lucid and calm on the telephone.  Repetitive of course as usual but not shouting.   Assessment and Plan: There was a quantity limit on the clonazepam and the 100 mg Seroquel.  I spoke with the pharmacy and I think I have got that readjusted.  I will let them know.  Follow-up 3 months.  No other change to medicine   Follow Up Instructions:    I discussed the assessment and treatment plan with the patient. The patient was provided an opportunity to ask questions and all were answered. The patient agreed with the plan and demonstrated an understanding of the instructions.   The patient was advised to call back or seek an in-person evaluation if the symptoms worsen or if the condition fails to improve as anticipated.  I provided 20 minutes of non-face-to-face time during this encounter.   Alethia Berthold, MD

## 2020-07-12 ENCOUNTER — Other Ambulatory Visit: Payer: Self-pay | Admitting: Psychiatry

## 2020-07-18 ENCOUNTER — Emergency Department (HOSPITAL_COMMUNITY)
Admission: EM | Admit: 2020-07-18 | Discharge: 2020-07-18 | Disposition: A | Discharge status: Home / Self Care | Payer: Medicare Other | Attending: Emergency Medicine | Admitting: Emergency Medicine

## 2020-07-18 ENCOUNTER — Other Ambulatory Visit: Payer: Self-pay

## 2020-07-18 ENCOUNTER — Encounter (HOSPITAL_COMMUNITY): Payer: Self-pay

## 2020-07-18 ENCOUNTER — Emergency Department (HOSPITAL_COMMUNITY): Payer: Medicare Other

## 2020-07-18 DIAGNOSIS — Z7982 Long term (current) use of aspirin: Secondary | ICD-10-CM | POA: Insufficient documentation

## 2020-07-18 DIAGNOSIS — E119 Type 2 diabetes mellitus without complications: Secondary | ICD-10-CM | POA: Insufficient documentation

## 2020-07-18 DIAGNOSIS — Z79899 Other long term (current) drug therapy: Secondary | ICD-10-CM | POA: Diagnosis not present

## 2020-07-18 DIAGNOSIS — F251 Schizoaffective disorder, depressive type: Secondary | ICD-10-CM | POA: Insufficient documentation

## 2020-07-18 DIAGNOSIS — Z23 Encounter for immunization: Secondary | ICD-10-CM | POA: Insufficient documentation

## 2020-07-18 DIAGNOSIS — F32A Depression, unspecified: Secondary | ICD-10-CM | POA: Diagnosis not present

## 2020-07-18 DIAGNOSIS — S060X0A Concussion without loss of consciousness, initial encounter: Secondary | ICD-10-CM | POA: Insufficient documentation

## 2020-07-18 DIAGNOSIS — W19XXXA Unspecified fall, initial encounter: Secondary | ICD-10-CM

## 2020-07-18 DIAGNOSIS — F419 Anxiety disorder, unspecified: Secondary | ICD-10-CM | POA: Insufficient documentation

## 2020-07-18 DIAGNOSIS — Y9301 Activity, walking, marching and hiking: Secondary | ICD-10-CM | POA: Diagnosis not present

## 2020-07-18 DIAGNOSIS — Z7984 Long term (current) use of oral hypoglycemic drugs: Secondary | ICD-10-CM | POA: Diagnosis not present

## 2020-07-18 DIAGNOSIS — I10 Essential (primary) hypertension: Secondary | ICD-10-CM | POA: Diagnosis not present

## 2020-07-18 DIAGNOSIS — J449 Chronic obstructive pulmonary disease, unspecified: Secondary | ICD-10-CM | POA: Insufficient documentation

## 2020-07-18 DIAGNOSIS — S0191XA Laceration without foreign body of unspecified part of head, initial encounter: Secondary | ICD-10-CM | POA: Insufficient documentation

## 2020-07-18 DIAGNOSIS — W1830XA Fall on same level, unspecified, initial encounter: Secondary | ICD-10-CM | POA: Insufficient documentation

## 2020-07-18 DIAGNOSIS — S0990XA Unspecified injury of head, initial encounter: Secondary | ICD-10-CM | POA: Diagnosis present

## 2020-07-18 DIAGNOSIS — Y92009 Unspecified place in unspecified non-institutional (private) residence as the place of occurrence of the external cause: Secondary | ICD-10-CM | POA: Diagnosis not present

## 2020-07-18 MED ORDER — PANTOPRAZOLE SODIUM 40 MG PO TBEC
40.0000 mg | DELAYED_RELEASE_TABLET | Freq: Every day | ORAL | Status: DC
  Administered 2020-07-18: 40 mg via ORAL
  Filled 2020-07-18: qty 1

## 2020-07-18 MED ORDER — TAMSULOSIN HCL 0.4 MG PO CAPS
0.8000 mg | ORAL_CAPSULE | Freq: Every day | ORAL | Status: DC
  Filled 2020-07-18: qty 2

## 2020-07-18 MED ORDER — LINAGLIPTIN 5 MG PO TABS
5.0000 mg | ORAL_TABLET | Freq: Every day | ORAL | Status: DC
  Administered 2020-07-18: 5 mg via ORAL
  Filled 2020-07-18: qty 1

## 2020-07-18 MED ORDER — OXYBUTYNIN CHLORIDE 5 MG PO TABS
10.0000 mg | ORAL_TABLET | Freq: Two times a day (BID) | ORAL | Status: DC
  Administered 2020-07-18: 10 mg via ORAL
  Filled 2020-07-18: qty 2

## 2020-07-18 MED ORDER — METOPROLOL SUCCINATE ER 25 MG PO TB24
50.0000 mg | ORAL_TABLET | Freq: Every day | ORAL | Status: DC
  Administered 2020-07-18: 50 mg via ORAL
  Filled 2020-07-18: qty 2

## 2020-07-18 MED ORDER — QUETIAPINE FUMARATE 100 MG PO TABS: 400.0000 mg | ORAL_TABLET | Freq: Every day | ORAL | Status: DC

## 2020-07-18 MED ORDER — SERTRALINE HCL 50 MG PO TABS
50.0000 mg | ORAL_TABLET | Freq: Two times a day (BID) | ORAL | Status: DC
  Administered 2020-07-18: 50 mg via ORAL
  Filled 2020-07-18: qty 1

## 2020-07-18 MED ORDER — CLONAZEPAM 0.5 MG PO TABS
1.0000 mg | ORAL_TABLET | Freq: Four times a day (QID) | ORAL | Status: DC
  Administered 2020-07-18: 1 mg via ORAL
  Filled 2020-07-18 (×2): qty 2

## 2020-07-18 MED ORDER — METFORMIN HCL 850 MG PO TABS: 850.0000 mg | ORAL_TABLET | Freq: Two times a day (BID) | ORAL | Status: DC

## 2020-07-18 MED ORDER — HYDROXYZINE HCL 10 MG PO TABS: 10.0000 mg | ORAL_TABLET | Freq: Three times a day (TID) | ORAL | Status: DC | PRN

## 2020-07-18 MED ORDER — DIVALPROEX SODIUM ER 500 MG PO TB24: 1500.0000 mg | ORAL_TABLET | Freq: Every day | ORAL | Status: DC

## 2020-07-18 MED ORDER — SIMETHICONE 80 MG PO CHEW
80.0000 mg | CHEWABLE_TABLET | Freq: Three times a day (TID) | ORAL | Status: DC
  Administered 2020-07-18: 80 mg via ORAL
  Filled 2020-07-18 (×2): qty 1

## 2020-07-18 MED ORDER — GABAPENTIN 100 MG PO CAPS
100.0000 mg | ORAL_CAPSULE | Freq: Three times a day (TID) | ORAL | Status: DC
  Administered 2020-07-18: 100 mg via ORAL
  Filled 2020-07-18 (×2): qty 1

## 2020-07-18 MED ORDER — ASPIRIN EC 81 MG PO TBEC
81.0000 mg | DELAYED_RELEASE_TABLET | Freq: Every day | ORAL | Status: DC
  Administered 2020-07-18: 81 mg via ORAL
  Filled 2020-07-18: qty 1

## 2020-07-18 MED ORDER — LORATADINE 10 MG PO TABS
10.0000 mg | ORAL_TABLET | Freq: Every day | ORAL | Status: DC
  Administered 2020-07-18: 10 mg via ORAL
  Filled 2020-07-18: qty 1

## 2020-07-18 MED ORDER — LAMOTRIGINE 25 MG PO TABS: 150.0000 mg | ORAL_TABLET | Freq: Every day | ORAL | Status: DC

## 2020-07-18 MED ORDER — QUETIAPINE FUMARATE 100 MG PO TABS
100.0000 mg | ORAL_TABLET | Freq: Three times a day (TID) | ORAL | Status: DC
  Administered 2020-07-18: 100 mg via ORAL
  Filled 2020-07-18 (×2): qty 1

## 2020-07-18 MED ORDER — TETANUS-DIPHTH-ACELL PERTUSSIS 5-2.5-18.5 LF-MCG/0.5 IM SUSY
0.5000 mL | PREFILLED_SYRINGE | Freq: Once | INTRAMUSCULAR | Status: AC
  Administered 2020-07-18: 0.5 mL via INTRAMUSCULAR
  Filled 2020-07-18: qty 0.5

## 2020-07-18 MED ORDER — LIDOCAINE-EPINEPHRINE (PF) 2 %-1:200000 IJ SOLN
20.0000 mL | Freq: Once | INTRAMUSCULAR | Status: AC
  Administered 2020-07-18: 20 mL
  Filled 2020-07-18: qty 20

## 2020-07-18 MED ORDER — LORAZEPAM 1 MG PO TABS: 2.0000 mg | ORAL_TABLET | Freq: Four times a day (QID) | ORAL | Status: DC | PRN

## 2020-07-18 MED ORDER — SIMVASTATIN 10 MG PO TABS
20.0000 mg | ORAL_TABLET | Freq: Every day | ORAL | Status: DC
  Filled 2020-07-18: qty 2

## 2020-07-18 MED ORDER — PRAZOSIN HCL 1 MG PO CAPS
1.0000 mg | ORAL_CAPSULE | Freq: Every day | ORAL | Status: DC
  Filled 2020-07-18 (×2): qty 1

## 2020-07-18 MED ORDER — ACETAMINOPHEN 325 MG PO TABS: 650.0000 mg | ORAL_TABLET | Freq: Two times a day (BID) | ORAL | Status: DC | PRN

## 2020-07-18 MED ORDER — ZIPRASIDONE MESYLATE 20 MG IM SOLR
INTRAMUSCULAR | Status: AC
  Filled 2020-07-18: qty 20

## 2020-07-18 NOTE — ED Notes (Signed)
ED Provider at bedside. 

## 2020-07-18 NOTE — ED Provider Notes (Signed)
[facility] EMERGENCY DEPARTMENT Provider Note   CSN: [id number] Arrival date & time: 07/18/20  8099     History Chief Complaint  Patient presents with  . Fall    Laceration to left temple area   Level 5 caveat due to developmental delay/psychiatric disorder Alexander Beard is a 65 y.o. male.  HPI    Patient with history of developmental disorder, schizo affective disorder presents for a fall.  Patient lives in a care home.  He was walking and fell injuring the left side of his head.  No LOC reported.  Patient reports mild pain in that area but no other acute complaints Past Medical History:  Diagnosis Date  . Anxiety   . Anxiety disorder due to known physiological condition    HOSPITALIZED 10/18  . Arthritis   . Autistic disorder, residual state   . COPD (chronic obstructive pulmonary disease) ([place])   . Depression   . Developmental disorder   . Diabetes mellitus without complication ([place])   . Dyslipidemia   . Esophageal reflux   . HOH (hard of hearing)    MILDLY  . Hypertension   . Obesity   . Overactive bladder   . Palpitations    ANXIETY  . Schizophrenia, schizoaffective ([place])   . Sleep apnea   . Tremors of nervous system    HANDS DUE TO MEDICATIONS  . Urinary incontinence     Patient Active Problem List   Diagnosis Date Noted  . Intellectual disability 03/06/2020  . At risk for elopement 03/06/2020  . Diabetes ([place]) 04/10/2015  . Hypertension 04/10/2015  . Prostate hypertrophy 04/10/2015  . Schizoaffective disorder ([place]) 04/10/2015  . Enlarged prostate 04/10/2015  . Essential (primary) hypertension 04/10/2015  . Type 2 diabetes mellitus ([place]) 04/10/2015  . Controlled type 2 diabetes mellitus without complication ([place]) 83/38/2505  . Schizoaffective disorder, bipolar type ([place]) 12/29/2014  . Active autistic disorder 12/29/2014    Past Surgical History:  Procedure Laterality Date  . CATARACT EXTRACTION W/PHACO Left 11/14/2017   Procedure: CATARACT  EXTRACTION PHACO AND INTRAOCULAR LENS PLACEMENT (IOC);  Surgeon: Birder Robson, MD;  Location: [facility] ORS;  Service: Ophthalmology;  Laterality: Left;  [place] 00:42 AP% 14.6 CDE 6.12 Fluid pack lot # [id number]  . COLONOSCOPY  01/28/2005  . COLONOSCOPY WITH PROPOFOL N/A 05/09/2016   Procedure: COLONOSCOPY WITH PROPOFOL;  Surgeon: Manya Silvas, MD;  Location: [facility] ENDOSCOPY;  Service: Endoscopy;  Laterality: N/A;  . ESOPHAGOGASTRODUODENOSCOPY N/A 05/09/2016   Procedure: ESOPHAGOGASTRODUODENOSCOPY (EGD);  Surgeon: Manya Silvas, MD;  Location: [facility] ENDOSCOPY;  Service: Endoscopy;  Laterality: N/A;  . FRACTURE SURGERY     ORIF SHOULDER  . TONSILLECTOMY         Family History  Problem Relation Age of Onset  . Hypertension Mother   . Stroke Father   . Heart Problems Father     Social History   Tobacco Use  . Smoking status: Never Smoker  . Smokeless tobacco: Never Used  Vaping Use  . Vaping Use: Never used  Substance Use Topics  . Alcohol use: No    Alcohol/week: 0.0 standard drinks  . Drug use: No    Home Medications Prior to Admission medications   Medication Sig Start Date End Date Taking? Authorizing Provider  acetaminophen (TYLENOL) 650 MG CR tablet Take 650 mg by mouth every 12 (twelve) hours as needed for pain.    [provider]  aspirin EC 81 MG EC tablet Take 1 tablet (81 mg total)  by mouth daily. Swallow whole. 06/09/20   [name], [name], MD  clonazePAM (KLONOPIN) 2 MG tablet Take 0.5 tablets (1 mg total) by mouth 4 (four) times daily. 06/30/20   [name], [name], MD  divalproex (DEPAKOTE ER) 500 MG 24 hr tablet Take 3 tablets (1,500 mg total) by mouth at bedtime. 06/08/20   [name], [name], MD  gabapentin (NEURONTIN) 100 MG capsule Take 1 capsule (100 mg total) by mouth 3 (three) times daily. 06/08/20   [name], [name], MD  hydrOXYzine (ATARAX/VISTARIL) 10 MG tablet Take 1 tablet (10 mg total) by mouth 3 (three) times daily as needed for anxiety. 06/08/20    [name], [name], MD  lamoTRIgine (LAMICTAL) 150 MG tablet Take 1 tablet (150 mg total) by mouth at bedtime. 06/08/20   [name], [name], MD  linagliptin (TRADJENTA) 5 MG TABS tablet Take 1 tablet (5 mg total) by mouth daily. 06/09/20   [name], [name], MD  loratadine (CLARITIN) 10 MG tablet Take 1 tablet (10 mg total) by mouth daily. 06/08/20   [name], [name], MD  LORazepam (ATIVAN) 2 MG tablet Take 1 tablet (2 mg total) by mouth every 6 (six) hours as needed (severe anxiety). 06/08/20   [name], [name], MD  metFORMIN (GLUCOPHAGE) 850 MG tablet Take 1 tablet (850 mg total) by mouth 2 (two) times daily with a meal. 06/08/20   [name], [name], MD  metoprolol succinate (TOPROL-XL) 50 MG 24 hr tablet Take 1 tablet (50 mg total) by mouth daily. 06/09/20   [name], [name], MD  oxybutynin (DITROPAN) 5 MG tablet Take 2 tablets (10 mg total) by mouth 2 (two) times daily. 06/08/20   [name], [name], MD  pantoprazole (PROTONIX) 40 MG tablet Take 1 tablet (40 mg total) by mouth daily. 06/08/20   [name], [name], MD  prazosin (MINIPRESS) 1 MG capsule Take 1 capsule (1 mg total) by mouth at bedtime. 06/08/20   [name], [name], MD  QUEtiapine (SEROQUEL) 100 MG tablet Take 1 tablet (100 mg total) by mouth 3 (three) times daily. 06/30/20   [name], [name], MD  QUEtiapine (SEROQUEL) 400 MG tablet Take 1 tablet (400 mg total) by mouth at bedtime. 06/08/20   [name], [name], MD  sertraline (ZOLOFT) 50 MG tablet Take 1 tablet (50 mg total) by mouth 2 (two) times daily. 06/08/20   [name], [name], MD  simethicone (MYLICON) 80 MG chewable tablet Chew 1 tablet (80 mg total) by mouth 3 (three) times daily. 06/08/20   [name], [name], MD  simvastatin (ZOCOR) 20 MG tablet Take 1 tablet (20 mg total) by mouth daily at 6 PM. 06/08/20   [name], [name], MD  tamsulosin (FLOMAX) 0.4 MG CAPS capsule Take 2 capsules (0.8 mg total) by mouth daily after supper. 06/08/20   [name], [name], MD    Allergies    Other, Penicillins, and  Cortizone-10 [hydrocortisone]  Review of Systems   Review of Systems  Unable to perform ROS: Psychiatric disorder    Physical Exam Updated Vital Signs BP 137/83   Pulse (!) 53   Temp 98 F (36.7 C) (Oral)   Resp 17   Ht 1.727 m (5\' 8" )   Wt 104.3 kg   SpO2 96%   BMI 34.96 kg/m   Physical Exam CONSTITUTIONAL: Chronically ill-appearing HEAD: Laceration noted to left temple, no active bleeding.  No other signs of trauma EYES: EOMI/PERRL ENMT: Mucous membranes moist, poor dentition, no signs of any nasal or  dental trauma. NECK: supple no meningeal signs SPINE/BACK:entire spine nontender CV: S1/S2 noted, no murmurs/rubs/gallops noted LUNGS: Lungs are clear to auscultation bilaterally, no apparent distress ABDOMEN: soft, nontender NEURO: Pt is awake/alert,moves all extremitiesx4.  No facial droop.   EXTREMITIES: pulses normal/equal, full ROM, all other extremities/joints palpated/ranged and nontender SKIN: warm, color normal   ED Results / Procedures / Treatments   Labs (all labs ordered are listed, but only abnormal results are displayed) Labs Reviewed - No data to display  EKG None  Radiology No results found.  Procedures .Marland KitchenLaceration Repair  Date/Time: 07/18/2020 4:18 AM Performed by: Ripley Fraise, MD Authorized by: Ripley Fraise, MD   Consent:    Consent obtained:  Verbal   Consent given by:  Patient Anesthesia:    Anesthesia method:  Local infiltration   Local anesthetic:  Lidocaine 2% WITH epi Laceration details:    Location:  Scalp   Scalp location:  L temporal   Length (cm):  2 Pre-procedure details:    Preparation:  Patient was prepped and draped in usual sterile fashion Treatment:    Amount of cleaning:  Standard Skin repair:    Repair method:  Sutures   Suture size:  5-0   Wound skin closure material used: Vicryl.   Number of sutures:  3 Approximation:    Approximation:  Close Repair type:    Repair type:  Simple Post-procedure  details:    Procedure completion:  Tolerated well, no immediate complications    Medications Ordered in ED Medications  lidocaine-EPINEPHrine (XYLOCAINE W/EPI) 2 %-1:200000 (PF) injection 20 mL (20 mLs Infiltration Given 07/18/20 0414)  Tdap (BOOSTRIX) injection 0.5 mL (0.5 mLs Intramuscular Given 07/18/20 0414)    ED Course  I have reviewed the triage vital signs and the nursing notes.  Pertinent imaging results that were available during my care of the patient were reviewed by me and considered in my medical decision making (see chart for details).    MDM Rules/Calculators/A&P                         4:18 AM I discussed the case with a worker from the [place] care home.  He reports the patient was walking and he fell hitting the left side of his head.  No LOC reported.  He reports the patient tries to walk faster than he should and this likely caused his fall.  He has otherwise been at his baseline.  Review of medications does not reveal any anticoagulation Will obtain CT head and then repair laceration 5:51 AM CT head was reviewed and is negative. Laceration repaired without difficulty. No other signs of acute traumatic injury. Patient be discharged back to his care  home Final Clinical Impression(s) / ED Diagnoses Final diagnoses:  Fall, initial encounter  Concussion without loss of consciousness, initial encounter  Laceration of head without foreign body, unspecified part of head, initial encounter    Rx / DC Orders ED Discharge Orders    None       Ripley Fraise, MD 07/18/20 [phone]

## 2020-07-18 NOTE — ED Triage Notes (Signed)
EMS called out for fall to [facility] care home. Pt has small lac to left temple area. Pt alert oriented to self, birthday, and month. Pt denies LOC.

## 2020-07-18 NOTE — ED Notes (Signed)
Pt ambulatory to restroom with standby assist. Breakfast tray ordered.

## 2020-07-18 NOTE — Discharge Instructions (Addendum)
Your  stitches will dissolve and do not need to be removed

## 2020-07-18 NOTE — TOC Transition Note (Addendum)
Transition of Care [facility]) - CM/SW Discharge Note   Patient Details  Name: Alexander Beard MRN: [id number] Date of Birth: Nov 21, 1955  Transition of Care [facility]) CM/SW Contact:  Natasha Bence, LCSW Phone Number: 07/18/2020, 5:12 PM   Clinical Narrative:    CSW notified of patient's refusal to go back to group home. CSW spoke to patient and patient's brother to discuss discharge planning. Patient reported that he would like to live more independently and did not like the yelling of other residence at his group home. Patient also reported that he felt lonely with not being able to spend time with his brother, friends and participate in recreational activity. Patient's brother reported that he believed an ALF would be a better fit for his brother and that the patient's family is agreeable to spend time with patient while participating in extra curricular activities. Patient's brother reported that he had recently not been able to see his brother due to having an injury that prevented him from driving. CSW contacted Robley Fries to discuss referral process. [facility] admissions reported that they were private pay and provided the number of a [facility] facility that would accept medicaid. CSW provided [facility] [place]'s contact information to patient's brother. CSW spoke with [facility] and provided him with patients information. CSW also provided patient and patient's brother with [facility]'s information and the contact information of Johnn Hai with [place] division of Vocational and rehabilitation services. Patient agreeable to return to [facility] group home with information for ALF's.    Final next level of care: Group Home Barriers to Discharge: Barriers Resolved   Patient Goals and CMS Choice Patient states their goals for this hospitalization and ongoing recovery are:: Return to Group home while seeking placement with ALF CMS Medicare.gov Compare Post Acute Care list provided  to:: Patient Choice offered to / list presented to : [place]  Discharge Placement                Patient to be transferred to facility by: [facility] Name of family member notified: Rexford Prevo Patient and family notified of of transfer: 07/18/20  Discharge Plan and Services                DME Arranged: N/A DME Agency: NA       HH Arranged: NA HH Agency: NA        Social Determinants of Health (SDOH) Interventions     Readmission Risk Interventions No flowsheet data found.
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
## 2020-07-18 NOTE — ED Notes (Signed)
Pt ambulatory to waiting room. Pt verbalized understanding of discharge instructions.   

## 2020-07-18 NOTE — ED Notes (Signed)
[facility] in [place] called and were given report by this RN. The Facility reports they will have someone available to provide Pt transportation back to their facility in 2-hrs.

## 2020-07-18 NOTE — ED Notes (Signed)
Pt given breakfast tray
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
## 2020-07-18 NOTE — ED Provider Notes (Addendum)
Pt ready for d/c and EMS is ready to take pt home.  Pt is refusing to go back.  He said the group home is "horrible."  He said they treat him poorly.  I am going to order pt's normal meds and have SW eval.   Isla Pence, MD 07/18/20 1415  While pt was waiting for SW.  He became more agitated and said he did not want to live this way.  He was yelling in the ED.  Pt given food and has calmed.  [place] ordered, but not given as he's calmed.  Psych orders placed.   Isla Pence, MD 07/18/20 1438    Isla Pence, MD 07/18/20 [phone]

## 2020-07-18 NOTE — ED Notes (Signed)
Pt given meal tray at this time 

## 2020-07-20 ENCOUNTER — Other Ambulatory Visit: Payer: Self-pay | Admitting: Psychiatry

## 2020-07-23 ENCOUNTER — Emergency Department
Admission: EM | Admit: 2020-07-23 | Discharge: 2020-07-23 | Disposition: A | Discharge status: Home / Self Care | Payer: Medicare Other | Attending: Emergency Medicine | Admitting: Emergency Medicine

## 2020-07-23 ENCOUNTER — Other Ambulatory Visit: Payer: Self-pay

## 2020-07-23 DIAGNOSIS — Y92009 Unspecified place in unspecified non-institutional (private) residence as the place of occurrence of the external cause: Secondary | ICD-10-CM

## 2020-07-23 DIAGNOSIS — Z79899 Other long term (current) drug therapy: Secondary | ICD-10-CM | POA: Diagnosis not present

## 2020-07-23 DIAGNOSIS — I10 Essential (primary) hypertension: Secondary | ICD-10-CM | POA: Diagnosis not present

## 2020-07-23 DIAGNOSIS — Z7984 Long term (current) use of oral hypoglycemic drugs: Secondary | ICD-10-CM | POA: Diagnosis not present

## 2020-07-23 DIAGNOSIS — E119 Type 2 diabetes mellitus without complications: Secondary | ICD-10-CM | POA: Diagnosis not present

## 2020-07-23 DIAGNOSIS — Z7982 Long term (current) use of aspirin: Secondary | ICD-10-CM | POA: Diagnosis not present

## 2020-07-23 DIAGNOSIS — J449 Chronic obstructive pulmonary disease, unspecified: Secondary | ICD-10-CM | POA: Diagnosis not present

## 2020-07-23 DIAGNOSIS — S0181XA Laceration without foreign body of other part of head, initial encounter: Secondary | ICD-10-CM | POA: Diagnosis not present

## 2020-07-23 DIAGNOSIS — W19XXXA Unspecified fall, initial encounter: Secondary | ICD-10-CM | POA: Insufficient documentation

## 2020-07-23 DIAGNOSIS — S0993XA Unspecified injury of face, initial encounter: Secondary | ICD-10-CM | POA: Diagnosis present

## 2020-07-23 DIAGNOSIS — W19XXXD Unspecified fall, subsequent encounter: Secondary | ICD-10-CM
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
## 2020-07-23 NOTE — ED Notes (Signed)
Pt provided with food and drink.

## 2020-07-23 NOTE — ED Notes (Addendum)
[facility] called again regarding picking up pt for discharge. Per [facility], will try calling transport again at this time.

## 2020-07-23 NOTE — ED Notes (Signed)
Messaged social work again regarding pt at this time.
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
## 2020-07-23 NOTE — ED Notes (Signed)
[facility] contacted by phone about patient's discharge and states they will call their transportation, but are unsure how long that will take.

## 2020-07-23 NOTE — ED Notes (Addendum)
Social work called at this time, made aware of pt statements. Per social work, will be here as soon as possible and stated that they will call social services.
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
## 2020-07-23 NOTE — ED Provider Notes (Signed)
[facility] Emergency Department Provider Note   ____________________________________________   None    (approximate)  I have reviewed the triage vital signs and the nursing notes.   HISTORY  Chief Complaint Fall    HPI Alexander Beard is a 65 y.o. male patient arrived via EMS from [facility] care complaining of a fall today.  Patient also state he had a fall yesterday.  Denies any head injury from the fall.  Patient was questioned about ecchymosis and bruises of the face which are in different stages of healing process.  History revealed the patient had a fall on 07/18/2020.  Patient was seen at [facility] emergency room.  Patient had a CT that was unremarkable.  Patient sustained a laceration to the left temporal area that was sutured.  Patient was reluctant to return back to the home.  Patient denies pain at this time.  Patient stated he does not want to return back to the group home.         Past Medical History:  Diagnosis Date  . Anxiety   . Anxiety disorder due to known physiological condition    HOSPITALIZED 10/18  . Arthritis   . Autistic disorder, residual state   . COPD (chronic obstructive pulmonary disease) ([place])   . Depression   . Developmental disorder   . Diabetes mellitus without complication ([place])   . Dyslipidemia   . Esophageal reflux   . HOH (hard of hearing)    MILDLY  . Hypertension   . Obesity   . Overactive bladder   . Palpitations    ANXIETY  . Schizophrenia, schizoaffective ([place])   . Sleep apnea   . Tremors of nervous system    HANDS DUE TO MEDICATIONS  . Urinary incontinence     Patient Active Problem List   Diagnosis Date Noted  . Intellectual disability 03/06/2020  . At risk for elopement 03/06/2020  . Diabetes ([place]) 04/10/2015  . Hypertension 04/10/2015  . Prostate hypertrophy 04/10/2015  . Schizoaffective disorder ([place]) 04/10/2015  . Enlarged prostate 04/10/2015  . Essential (primary) hypertension  04/10/2015  . Type 2 diabetes mellitus ([place]) 04/10/2015  . Controlled type 2 diabetes mellitus without complication ([place]) 16/04/9603  . Schizoaffective disorder, bipolar type ([place]) 12/29/2014  . Active autistic disorder 12/29/2014    Past Surgical History:  Procedure Laterality Date  . CATARACT EXTRACTION W/PHACO Left 11/14/2017   Procedure: CATARACT EXTRACTION PHACO AND INTRAOCULAR LENS PLACEMENT (IOC);  Surgeon: Birder Robson, MD;  Location: [facility] ORS;  Service: Ophthalmology;  Laterality: Left;  [place] 00:42 AP% 14.6 CDE 6.12 Fluid pack lot # [id number]  . COLONOSCOPY  01/28/2005  . COLONOSCOPY WITH PROPOFOL N/A 05/09/2016   Procedure: COLONOSCOPY WITH PROPOFOL;  Surgeon: Manya Silvas, MD;  Location: [facility] ENDOSCOPY;  Service: Endoscopy;  Laterality: N/A;  . ESOPHAGOGASTRODUODENOSCOPY N/A 05/09/2016   Procedure: ESOPHAGOGASTRODUODENOSCOPY (EGD);  Surgeon: Manya Silvas, MD;  Location: [facility] ENDOSCOPY;  Service: Endoscopy;  Laterality: N/A;  . FRACTURE SURGERY     ORIF SHOULDER  . TONSILLECTOMY      Prior to Admission medications   Medication Sig Start Date End Date Taking? Authorizing Provider  aspirin EC 81 MG EC tablet Take 1 tablet (81 mg total) by mouth daily. Swallow whole. 06/09/20  Yes [name], [name], MD  clonazePAM (KLONOPIN) 2 MG tablet Take 0.5 tablets (1 mg total) by mouth 4 (four) times daily. 06/30/20  Yes [name], [name], MD  gabapentin (NEURONTIN) 100 MG capsule Take  1 capsule (100 mg total) by mouth 3 (three) times daily. 06/08/20  Yes [name], [name], MD  hydrOXYzine (ATARAX/VISTARIL) 10 MG tablet Take 1 tablet (10 mg total) by mouth 3 (three) times daily as needed for anxiety. 06/08/20  Yes [name], [name], MD  lamoTRIgine (LAMICTAL) 150 MG tablet Take 1 tablet (150 mg total) by mouth at bedtime. 06/08/20  Yes [name], [name], MD  loratadine (CLARITIN) 10 MG tablet Take 1 tablet (10 mg total) by mouth daily. 06/08/20  Yes [name], [name], MD  metFORMIN  (GLUCOPHAGE) 850 MG tablet Take 1 tablet (850 mg total) by mouth 2 (two) times daily with a meal. 06/08/20  Yes [name], [name], MD  metoprolol succinate (TOPROL-XL) 50 MG 24 hr tablet Take 1 tablet (50 mg total) by mouth daily. 06/09/20  Yes [name], [name], MD  oxybutynin (DITROPAN) 5 MG tablet Take 2 tablets (10 mg total) by mouth 2 (two) times daily. 06/08/20  Yes [name], [name], MD  pantoprazole (PROTONIX) 40 MG tablet Take 1 tablet (40 mg total) by mouth daily. 06/08/20  Yes [name], [name], MD  prazosin (MINIPRESS) 1 MG capsule Take 1 capsule (1 mg total) by mouth at bedtime. 06/08/20  Yes [name], [name], MD  QUEtiapine (SEROQUEL) 100 MG tablet Take 1 tablet (100 mg total) by mouth 3 (three) times daily. 06/30/20  Yes [name], [name], MD  QUEtiapine (SEROQUEL) 400 MG tablet Take 1 tablet (400 mg total) by mouth at bedtime. 06/08/20  Yes [name], [name], MD  acetaminophen (TYLENOL) 650 MG CR tablet Take 650 mg by mouth every 12 (twelve) hours as needed for pain.    [provider]  divalproex (DEPAKOTE ER) 500 MG 24 hr tablet Take 3 tablets (1,500 mg total) by mouth at bedtime. 06/08/20   [name], [name], MD  linagliptin (TRADJENTA) 5 MG TABS tablet Take 1 tablet (5 mg total) by mouth daily. Patient not taking: Reported on 07/23/2020 06/09/20   [name], [name], MD  LORazepam (ATIVAN) 2 MG tablet Take 1 tablet (2 mg total) by mouth every 6 (six) hours as needed (severe anxiety). 06/08/20   [name], [name], MD  sertraline (ZOLOFT) 50 MG tablet Take 1 tablet (50 mg total) by mouth 2 (two) times daily. Patient not taking: Reported on 07/23/2020 06/08/20   [name], [name], MD  simethicone (MYLICON) 80 MG chewable tablet Chew 1 tablet (80 mg total) by mouth 3 (three) times daily. 06/08/20   [name], [name], MD  simvastatin (ZOCOR) 20 MG tablet Take 1 tablet (20 mg total) by mouth daily at 6 PM. Patient not taking: Reported on 07/23/2020 06/08/20   [name], [name], MD  tamsulosin (FLOMAX) 0.4  MG CAPS capsule Take 2 capsules (0.8 mg total) by mouth daily after supper. Patient not taking: Reported on 07/23/2020 06/08/20   [name], [name], MD    Allergies Other, Penicillins, and Cortizone-10 [hydrocortisone]  Family History  Problem Relation Age of Onset  . Hypertension Mother   . Stroke Father   . Heart Problems Father     Social History Social History   Tobacco Use  . Smoking status: Never Smoker  . Smokeless tobacco: Never Used  Vaping Use  . Vaping Use: Never used  Substance Use Topics  . Alcohol use: No    Alcohol/week: 0.0 standard drinks  . Drug use: No    Review of Systems Constitutional: No fever/chills Eyes: No visual changes. ENT: No sore throat. Cardiovascular: Denies  chest pain. Respiratory: Denies shortness of breath. Gastrointestinal: No abdominal pain.  No nausea, no vomiting.  No diarrhea.  No constipation. Genitourinary: Negative for dysuria. Musculoskeletal: Negative for back pain. Skin:  Facial ecchymosis.  Status post facial laceration. Neurological: Negative for headaches, focal weakness or numbness. Psychiatric:  Anxiety, autism, depression, and schizophrenia. Endocrine:  Diabetes, hyperlipidemia, and hypertension. Allergic/Immunilogical: Cortisone and penicillin ____________________________________________   PHYSICAL EXAM:  VITAL SIGNS: ED Triage Vitals [07/23/20 0906]  Enc Vitals Group     BP (!) 146/103     Pulse Rate 60     Resp 16     Temp 97.6 F (36.4 C)     Temp Source Oral     SpO2 99 %     Weight 229 lb 4.5 oz (104 kg)     Height 5\' 8"  (1.727 m)     Head Circumference      Peak Flow      Pain Score 0     Pain Loc      Pain Edu?      Excl. in [place]?     Constitutional: Alert and oriented. Well appearing and in no acute distress. Eyes: Conjunctivae are normal. PERRL. EOMI. Head: Atraumatic.  Left temporal laceration Nose: No congestion/rhinnorhea. Mouth/Throat: Mucous membranes are moist.  Oropharynx  non-erythematous. Neck: No stridor.  No cervical spine tenderness to palpation.  Full and equal range of motion. Hematological/Lymphatic/Immunilogical: No cervical lymphadenopathy. Cardiovascular: Normal rate, regular rhythm. Grossly normal heart sounds.  Good peripheral circulation.  Elevated systolic blood pressure Respiratory: Normal respiratory effort.  No retractions. Lungs CTAB. Gastrointestinal: Soft and nontender. No distention. No abdominal bruits. No CVA tenderness. Genitourinary: Deferred Musculoskeletal: No lower extremity tenderness nor edema.  No joint effusions. Neurologic:  Normal speech and language. No gross focal neurologic deficits are appreciated. No gait instability. Skin:  Skin is warm, dry and intact. No rash noted.  Fading facial ecchymosis.  Healing facial laceration Psychiatric: Mood and affect are normal. Speech and behavior are normal.  ____________________________________________   LABS (all labs ordered are listed, but only abnormal results are displayed)  Labs Reviewed - No data to display ____________________________________________  EKG   ____________________________________________  RADIOLOGY I, Sable Feil, personally viewed and evaluated these images (plain radiographs) as part of my medical decision making, as well as reviewing the written report by the radiologist.  ED MD interpretation:   Official radiology report(s): No results found.  ____________________________________________   PROCEDURES  Procedure(s) performed (including Critical Care):  Procedures   ____________________________________________   INITIAL IMPRESSION / ASSESSMENT AND PLAN / ED COURSE  As part of my medical decision making, I reviewed the following data within the [facility]         Discussed patient with [facility] home care work-up.  Patient main concern today is that he does not want to return to the nursing home.  Seems to be a  repetitive complaint on his multiple visits this and other facilities..  Denies any pain at this time.  Physical exam was grossly unremarkable.  Social services has been notified and will evaluate the patient.  Patient is able to ambulate from his bed and walk across the hallway to  bathroom without  discomfort.  Patient will be discharged and advised to follow-up as needed.  Recommending either close supervision or assistance with ambulation i.e. walker.   ____________________________________________   FINAL CLINICAL IMPRESSION(S) / ED DIAGNOSES  Final diagnoses:  Fall in home, subsequent encounter  ED Discharge Orders    None      *Please note:  Alexander Beard was evaluated in Emergency Department on 07/23/2020 for the symptoms described in the history of present illness. He was evaluated in the context of the global COVID-19 pandemic, which necessitated consideration that the patient might be at risk for infection with the SARS-CoV-2 virus that causes COVID-19. Institutional protocols and algorithms that pertain to the evaluation of patients at risk for COVID-19 are in a state of rapid change based on information released by regulatory bodies including the CDC and federal and state organizations. These policies and algorithms were followed during the patient's care in the ED.  Some ED evaluations and interventions may be delayed as a result of limited staffing during and the pandemic.*   Note:  This document was prepared using Dragon voice recognition software and may include unintentional dictation errors.    Sable Feil, PA-C 07/23/20 1554    Blake Divine, MD 07/24/20 [phone]

## 2020-07-23 NOTE — Discharge Instructions (Addendum)
No acute findings on exam today.  Patient is at risk for falls and need to consider using a walker or close supervision.

## 2020-07-23 NOTE — ED Notes (Addendum)
[facility] called for another patient for transport and states that they are making plans to pick up both patients. Patient is resting comfortable on the stretcher.

## 2020-07-23 NOTE — ED Notes (Addendum)
This RN contacted Kerrie Latour at [phone] who stated he is pt's legal guardian but is his healthcare POA because pt was declared unable to make own decisions. This RN notified him of pt's arrival

## 2020-07-23 NOTE — TOC Transition Note (Signed)
Transition of Care [facility]) - CM/SW Discharge Note   Patient Details  Name: Alexander Beard MRN: [id number] Date of Birth: [birth year]  [facility] CM/SW Contact:  Ova Freshwater Phone Number: [phone] 07/23/2020, 4:21 PM   Clinical Narrative:     Patient will d/c back to [facility] [place] confirmed with Vivien Rota [phone], transport will come and pick him up, but he could not give me an ETA.         Patient Goals and CMS Choice        Discharge Placement                       Discharge Plan and Services                                     Social Determinants of Health (SDOH) Interventions     Readmission Risk Interventions No flowsheet data found.

## 2020-07-23 NOTE — ED Notes (Signed)
Pt given diet cola per request at this time

## 2020-07-23 NOTE — ED Notes (Signed)
Contact information for [facility], group home of pt, given to NP Mardee Postin to call at this time

## 2020-07-23 NOTE — ED Notes (Signed)
Patient's transport is waiting outside. She didn't want to come inside.

## 2020-07-23 NOTE — ED Triage Notes (Signed)
Pt to ED via [facility] from [facility] for chief complaint of fall today and yesterday. Denies blood thinner use but did hit head, tripped.  Denies pain at this time  Hx autism, alert and oriented, answering questions  Bruising noted to facial area around eyes

## 2020-08-04 ENCOUNTER — Inpatient Hospital Stay
Admission: EM | Admit: 2020-08-04 | Discharge: 2020-09-09 | DRG: 177 | Disposition: A | Discharge status: Home / Self Care | Payer: Medicare Other | Attending: Internal Medicine | Admitting: Internal Medicine

## 2020-08-04 ENCOUNTER — Emergency Department: Payer: Medicare Other

## 2020-08-04 ENCOUNTER — Other Ambulatory Visit: Payer: Self-pay

## 2020-08-04 DIAGNOSIS — G4710 Hypersomnia, unspecified: Secondary | ICD-10-CM | POA: Diagnosis present

## 2020-08-04 DIAGNOSIS — G9340 Encephalopathy, unspecified: Secondary | ICD-10-CM | POA: Diagnosis present

## 2020-08-04 DIAGNOSIS — Z823 Family history of stroke: Secondary | ICD-10-CM

## 2020-08-04 DIAGNOSIS — Z880 Allergy status to penicillin: Secondary | ICD-10-CM

## 2020-08-04 DIAGNOSIS — E119 Type 2 diabetes mellitus without complications: Secondary | ICD-10-CM | POA: Diagnosis present

## 2020-08-04 DIAGNOSIS — Z79899 Other long term (current) drug therapy: Secondary | ICD-10-CM

## 2020-08-04 DIAGNOSIS — Z751 Person awaiting admission to adequate facility elsewhere: Secondary | ICD-10-CM

## 2020-08-04 DIAGNOSIS — R4182 Altered mental status, unspecified: Secondary | ICD-10-CM

## 2020-08-04 DIAGNOSIS — G928 Other toxic encephalopathy: Secondary | ICD-10-CM | POA: Diagnosis present

## 2020-08-04 DIAGNOSIS — E876 Hypokalemia: Secondary | ICD-10-CM | POA: Diagnosis present

## 2020-08-04 DIAGNOSIS — R0602 Shortness of breath: Secondary | ICD-10-CM | POA: Diagnosis not present

## 2020-08-04 DIAGNOSIS — M47819 Spondylosis without myelopathy or radiculopathy, site unspecified: Secondary | ICD-10-CM | POA: Diagnosis present

## 2020-08-04 DIAGNOSIS — I10 Essential (primary) hypertension: Secondary | ICD-10-CM | POA: Diagnosis present

## 2020-08-04 DIAGNOSIS — J189 Pneumonia, unspecified organism: Secondary | ICD-10-CM | POA: Diagnosis present

## 2020-08-04 DIAGNOSIS — R296 Repeated falls: Secondary | ICD-10-CM | POA: Diagnosis present

## 2020-08-04 DIAGNOSIS — D696 Thrombocytopenia, unspecified: Secondary | ICD-10-CM | POA: Diagnosis present

## 2020-08-04 DIAGNOSIS — Z8249 Family history of ischemic heart disease and other diseases of the circulatory system: Secondary | ICD-10-CM

## 2020-08-04 DIAGNOSIS — F70 Mild intellectual disabilities: Secondary | ICD-10-CM | POA: Diagnosis present

## 2020-08-04 DIAGNOSIS — J1282 Pneumonia due to coronavirus disease 2019: Secondary | ICD-10-CM | POA: Diagnosis present

## 2020-08-04 DIAGNOSIS — E1169 Type 2 diabetes mellitus with other specified complication: Secondary | ICD-10-CM

## 2020-08-04 DIAGNOSIS — J440 Chronic obstructive pulmonary disease with acute lower respiratory infection: Secondary | ICD-10-CM | POA: Diagnosis present

## 2020-08-04 DIAGNOSIS — U071 COVID-19: Principal | ICD-10-CM | POA: Diagnosis present

## 2020-08-04 DIAGNOSIS — Z7982 Long term (current) use of aspirin: Secondary | ICD-10-CM

## 2020-08-04 DIAGNOSIS — N3281 Overactive bladder: Secondary | ICD-10-CM | POA: Diagnosis present

## 2020-08-04 DIAGNOSIS — Z7984 Long term (current) use of oral hypoglycemic drugs: Secondary | ICD-10-CM

## 2020-08-04 DIAGNOSIS — Z888 Allergy status to other drugs, medicaments and biological substances status: Secondary | ICD-10-CM

## 2020-08-04 DIAGNOSIS — J9601 Acute respiratory failure with hypoxia: Secondary | ICD-10-CM | POA: Diagnosis present

## 2020-08-04 DIAGNOSIS — E785 Hyperlipidemia, unspecified: Secondary | ICD-10-CM | POA: Diagnosis present

## 2020-08-04 DIAGNOSIS — F250 Schizoaffective disorder, bipolar type: Secondary | ICD-10-CM | POA: Diagnosis present

## 2020-08-04 DIAGNOSIS — F840 Autistic disorder: Secondary | ICD-10-CM | POA: Diagnosis present

## 2020-08-04 LAB — CBC
HCT: 44.2 % (ref 39.0–52.0)
Hemoglobin: 14.1 g/dL (ref 13.0–17.0)
MCH: 30.2 pg (ref 26.0–34.0)
MCHC: 31.9 g/dL (ref 30.0–36.0)
MCV: 94.6 fL (ref 80.0–100.0)
Platelets: 105 K/uL — ABNORMAL LOW (ref 150–400)
RBC: 4.67 MIL/uL (ref 4.22–5.81)
RDW: 14.5 % (ref 11.5–15.5)
WBC: 4 K/uL (ref 4.0–10.5)
nRBC: 0 % (ref 0.0–0.2)

## 2020-08-04 LAB — AMMONIA: Ammonia: 28 umol/L (ref 9–35)

## 2020-08-04 LAB — PROCALCITONIN: Procalcitonin: <0.1 ng/mL

## 2020-08-04 LAB — URINALYSIS, COMPLETE (UACMP) WITH MICROSCOPIC
Bilirubin Urine: NEGATIVE
Glucose, UA: NEGATIVE mg/dL
Hgb urine dipstick: NEGATIVE
Ketones, ur: 5 mg/dL — AB
Leukocytes,Ua: NEGATIVE
Nitrite: NEGATIVE
Protein, ur: 30 mg/dL — AB
Specific Gravity, Urine: 1.027 (ref 1.005–1.030)
pH: 5 (ref 5.0–8.0)

## 2020-08-04 LAB — BASIC METABOLIC PANEL WITH GFR
Anion gap: 12 (ref 5–15)
BUN: 13 mg/dL (ref 8–23)
CO2: 28 mmol/L (ref 22–32)
Calcium: 8.6 mg/dL — ABNORMAL LOW (ref 8.9–10.3)
Chloride: 100 mmol/L (ref 98–111)
Creatinine, Ser: 0.68 mg/dL (ref 0.61–1.24)
GFR, Estimated: >60 mL/min
Glucose, Bld: 105 mg/dL — ABNORMAL HIGH (ref 70–99)
Potassium: 4 mmol/L (ref 3.5–5.1)
Sodium: 140 mmol/L (ref 135–145)

## 2020-08-04 LAB — TROPONIN I (HIGH SENSITIVITY)
Troponin I (High Sensitivity): 4 ng/L
Troponin I (High Sensitivity): 4 ng/L

## 2020-08-04 LAB — URINE DRUG SCREEN, QUALITATIVE (ARMC ONLY)
Amphetamines, Ur Screen: NOT DETECTED
Barbiturates, Ur Screen: NOT DETECTED
Benzodiazepine, Ur Scrn: POSITIVE — AB
Cannabinoid 50 Ng, Ur ~~LOC~~: NOT DETECTED
Cocaine Metabolite,Ur ~~LOC~~: NOT DETECTED
MDMA (Ecstasy)Ur Screen: NOT DETECTED
Methadone Scn, Ur: NOT DETECTED
Opiate, Ur Screen: NOT DETECTED
Phencyclidine (PCP) Ur S: NOT DETECTED
Tricyclic, Ur Screen: POSITIVE — AB

## 2020-08-04 LAB — SARS CORONAVIRUS 2 BY RT PCR (HOSPITAL ORDER, PERFORMED IN ~~LOC~~ HOSPITAL LAB): SARS Coronavirus 2: POSITIVE — AB

## 2020-08-04 LAB — LACTIC ACID, PLASMA
Lactic Acid, Venous: 2.3 mmol/L (ref 0.5–1.9)
Lactic Acid, Venous: 2.1 mmol/L (ref 0.5–1.9)

## 2020-08-04 LAB — CBG MONITORING, ED
Glucose-Capillary: 79 mg/dL (ref 70–99)
Glucose-Capillary: 82 mg/dL (ref 70–99)

## 2020-08-04 LAB — VALPROIC ACID LEVEL: Valproic Acid Lvl: 92 ug/mL (ref 50.0–100.0)

## 2020-08-04 MED ORDER — METHYLPREDNISOLONE SODIUM SUCC 40 MG IJ SOLR
40.0000 mg | Freq: Two times a day (BID) | INTRAMUSCULAR | Status: AC
  Administered 2020-08-04 – 2020-08-07 (×7): 40 mg via INTRAVENOUS
  Filled 2020-08-04 (×7): qty 1

## 2020-08-04 MED ORDER — ENOXAPARIN SODIUM 40 MG/0.4ML ~~LOC~~ SOLN
40.0000 mg | SUBCUTANEOUS | Status: DC
  Administered 2020-08-05 – 2020-09-09 (×36): 40 mg via SUBCUTANEOUS
  Filled 2020-08-04 (×36): qty 0.4

## 2020-08-04 MED ORDER — INSULIN ASPART 100 UNIT/ML ~~LOC~~ SOLN: 0.0000 [IU] | SUBCUTANEOUS | Status: DC

## 2020-08-04 MED ORDER — SODIUM CHLORIDE 0.9 % IV SOLN
200.0000 mg | Freq: Once | INTRAVENOUS | Status: AC
  Administered 2020-08-05: 200 mg via INTRAVENOUS
  Filled 2020-08-04: qty 200

## 2020-08-04 MED ORDER — ONDANSETRON HCL 4 MG PO TABS: 4.0000 mg | ORAL_TABLET | Freq: Four times a day (QID) | ORAL | Status: DC | PRN

## 2020-08-04 MED ORDER — ASCORBIC ACID 500 MG PO TABS
500.0000 mg | ORAL_TABLET | Freq: Every day | ORAL | Status: DC
  Administered 2020-08-06 – 2020-08-08 (×3): 500 mg via ORAL
  Filled 2020-08-04 (×4): qty 1

## 2020-08-04 MED ORDER — ONDANSETRON HCL 4 MG/2ML IJ SOLN: 4.0000 mg | Freq: Four times a day (QID) | INTRAMUSCULAR | Status: DC | PRN

## 2020-08-04 MED ORDER — SODIUM CHLORIDE 0.9 % IV SOLN
100.0000 mg | Freq: Every day | INTRAVENOUS | Status: AC
  Administered 2020-08-05 – 2020-08-08 (×4): 100 mg via INTRAVENOUS
  Filled 2020-08-04 (×4): qty 20

## 2020-08-04 MED ORDER — ACETAMINOPHEN 325 MG PO TABS: 650.0000 mg | ORAL_TABLET | Freq: Four times a day (QID) | ORAL | Status: DC | PRN

## 2020-08-04 MED ORDER — ALBUTEROL SULFATE HFA 108 (90 BASE) MCG/ACT IN AERS
1.0000 | INHALATION_SPRAY | Freq: Four times a day (QID) | RESPIRATORY_TRACT | Status: DC | PRN
  Filled 2020-08-04 (×2): qty 6.7

## 2020-08-04 MED ORDER — GUAIFENESIN-DM 100-10 MG/5ML PO SYRP: 10.0000 mL | ORAL_SOLUTION | ORAL | Status: DC | PRN

## 2020-08-04 MED ORDER — SENNOSIDES-DOCUSATE SODIUM 8.6-50 MG PO TABS: 1.0000 | ORAL_TABLET | Freq: Every evening | ORAL | Status: DC | PRN

## 2020-08-04 MED ORDER — LACTATED RINGERS IV SOLN: INTRAVENOUS | Status: DC

## 2020-08-04 MED ORDER — ZINC SULFATE 220 (50 ZN) MG PO CAPS
220.0000 mg | ORAL_CAPSULE | Freq: Every day | ORAL | Status: DC
  Administered 2020-08-06 – 2020-08-08 (×3): 220 mg via ORAL
  Filled 2020-08-04 (×4): qty 1

## 2020-08-04 MED ORDER — LACTATED RINGERS IV BOLUS
1000.0000 mL | Freq: Once | INTRAVENOUS | Status: AC
  Administered 2020-08-04: 1000 mL via INTRAVENOUS

## 2020-08-04 NOTE — H&P (Signed)
History and Physical    Alexander Beard LZJ:[id number] DOB: 08/16/1955 DOA: 08/04/2020  PCP: Remi Haggard, FNP  Patient coming from: Group home via EMS  I have personally briefly reviewed patient's old medical records in [place]  Chief Complaint: Shortness of breath  HPI: Alexander Beard is a 65 y.o. male with medical history significant for schizoaffective disorder, bipolar type, autism spectrum disorder, COPD, T2DM, HTN, HLD who presents to the ED for evaluation of shortness of breath.  Unable to obtain history from patient due to excessive somnolence and is otherwise obtained from EDP, chart review, and brother by phone.  Per ED documentation, patient was brought to ED via EMS who on their arrival noted patient SPO2 was 92% on room air with coarse crackles in the lungs.  He was placed on 4 L O2 via [place] on route to the ED.  On arrival patient was reportedly alert and interactive however following triage and initial labs he had a change in mental status described as a mumbling speech pattern and minimally responsive.  Spoke with patient's brother by phone.  His brother says that patient recently had a prolonged behavioral health hospital stay from August to December before he was discharged to group care home in [place] which is a new environment for him.  Brother states that previously patient's medication schedule was 4 times daily but since he left the hospital is now taking meds twice daily.  Brother states that he has noted significant "grogginess" over the last few weeks.  This has coincided with several falls that have not resulted in any significant injuries.  ED Course:  Initial vitals showed BP 104/65, pulse 68, RR 18, temp 99.1 F, SPO2 96% on room air.  Labs showed sodium 140, potassium 4.0, bicarb 28, BUN 13, creatinine 0.68, serum glucose 105, WBC 4.0, hemoglobin 14.1, platelets 105,000, high-sensitivity troponin I 4x2, procalcitonin <0.10, lactic acid 2.3, valproic  acid level 92, ammonia 28.  VBG showed pH 7.39, PCO2 62.  Urinalysis shows negative nitrates, negative leukocytes, 0-5 RBCs and WBCs/hpf, rare bacteria microscopy.  UDS positive for tricyclic's and benzodiazepines.  Blood cultures obtained and pending.  SARS-CoV-2 PCR is positive.  2 view chest x-ray shows airspace opacity at the left lung base.  CT head without contrast is negative for acute intracranial abnormality.  Atrophy, chronic microvascular disease noted.  CT cervical spine without contrast shows diffuse degenerative disc and facet disease without acute bony abnormality.  Patient was given 1 L LR and the hospitalist service was consulted to admit for further evaluation and management.  Review of Systems: All systems reviewed and are negative except as documented in history of present illness above.   Past Medical History:  Diagnosis Date  . Anxiety   . Anxiety disorder due to known physiological condition    HOSPITALIZED 10/18  . Arthritis   . Autistic disorder, residual state   . COPD (chronic obstructive pulmonary disease) ([place])   . Depression   . Developmental disorder   . Diabetes mellitus without complication ([place])   . Dyslipidemia   . Esophageal reflux   . HOH (hard of hearing)    MILDLY  . Hypertension   . Obesity   . Overactive bladder   . Palpitations    ANXIETY  . Schizophrenia, schizoaffective ([place])   . Sleep apnea   . Tremors of nervous system    HANDS DUE TO MEDICATIONS  . Urinary incontinence     Past Surgical History:  Procedure Laterality Date  . CATARACT EXTRACTION W/PHACO Left 11/14/2017   Procedure: CATARACT EXTRACTION PHACO AND INTRAOCULAR LENS PLACEMENT (IOC);  Surgeon: Birder Robson, MD;  Location: [facility] ORS;  Service: Ophthalmology;  Laterality: Left;  [place] 00:42 AP% 14.6 CDE 6.12 Fluid pack lot # [id number]  . COLONOSCOPY  01/28/2005  . COLONOSCOPY WITH PROPOFOL N/A 05/09/2016   Procedure: COLONOSCOPY WITH PROPOFOL;  Surgeon: Manya Silvas, MD;  Location: [facility] ENDOSCOPY;  Service: Endoscopy;  Laterality: N/A;  . ESOPHAGOGASTRODUODENOSCOPY N/A 05/09/2016   Procedure: ESOPHAGOGASTRODUODENOSCOPY (EGD);  Surgeon: Manya Silvas, MD;  Location: [facility] ENDOSCOPY;  Service: Endoscopy;  Laterality: N/A;  . FRACTURE SURGERY     ORIF SHOULDER  . TONSILLECTOMY      Social History:  reports that he has never smoked. He has never used smokeless tobacco. He reports that he does not drink alcohol and does not use drugs.  Allergies  Allergen Reactions  . Other Other (See Comments)    MYACINS  . Penicillins Nausea And Vomiting  . Cortizone-10 [Hydrocortisone] Rash    Family History  Problem Relation Age of Onset  . Hypertension Mother   . Stroke Father   . Heart Problems Father      Prior to Admission medications   Medication Sig Start Date End Date Taking? Authorizing Provider  acetaminophen (TYLENOL) 650 MG CR tablet Take 650 mg by mouth every 12 (twelve) hours as needed for pain.    [provider]  aspirin EC 81 MG EC tablet Take 1 tablet (81 mg total) by mouth daily. Swallow whole. 06/09/20   [name], [name], MD  clonazePAM (KLONOPIN) 2 MG tablet Take 0.5 tablets (1 mg total) by mouth 4 (four) times daily. 06/30/20   [name], [name], MD  divalproex (DEPAKOTE ER) 500 MG 24 hr tablet Take 3 tablets (1,500 mg total) by mouth at bedtime. 06/08/20   [name], [name], MD  gabapentin (NEURONTIN) 100 MG capsule Take 1 capsule (100 mg total) by mouth 3 (three) times daily. 06/08/20   [name], [name], MD  hydrOXYzine (ATARAX/VISTARIL) 10 MG tablet Take 1 tablet (10 mg total) by mouth 3 (three) times daily as needed for anxiety. 06/08/20   [name], [name], MD  lamoTRIgine (LAMICTAL) 150 MG tablet Take 1 tablet (150 mg total) by mouth at bedtime. 06/08/20   [name], [name], MD  linagliptin (TRADJENTA) 5 MG TABS tablet Take 1 tablet (5 mg total) by mouth daily. Patient not taking: Reported on 07/23/2020 06/09/20   [name],  [name], MD  loratadine (CLARITIN) 10 MG tablet Take 1 tablet (10 mg total) by mouth daily. 06/08/20   [name], [name], MD  LORazepam (ATIVAN) 2 MG tablet Take 1 tablet (2 mg total) by mouth every 6 (six) hours as needed (severe anxiety). 06/08/20   [name], [name], MD  metFORMIN (GLUCOPHAGE) 850 MG tablet Take 1 tablet (850 mg total) by mouth 2 (two) times daily with a meal. 06/08/20   [name], [name], MD  metoprolol succinate (TOPROL-XL) 50 MG 24 hr tablet Take 1 tablet (50 mg total) by mouth daily. 06/09/20   [name], [name], MD  oxybutynin (DITROPAN) 5 MG tablet Take 2 tablets (10 mg total) by mouth 2 (two) times daily. 06/08/20   [name], [name], MD  pantoprazole (PROTONIX) 40 MG tablet Take 1 tablet (40 mg total) by mouth daily. 06/08/20   [name], [name], MD  prazosin (MINIPRESS) 1 MG capsule Take 1 capsule (1 mg total) by  mouth at bedtime. 06/08/20   [name], [name], MD  QUEtiapine (SEROQUEL) 100 MG tablet Take 1 tablet (100 mg total) by mouth 3 (three) times daily. 06/30/20   [name], [name], MD  QUEtiapine (SEROQUEL) 400 MG tablet Take 1 tablet (400 mg total) by mouth at bedtime. 06/08/20   [name], [name], MD  sertraline (ZOLOFT) 50 MG tablet Take 1 tablet (50 mg total) by mouth 2 (two) times daily. Patient not taking: Reported on 07/23/2020 06/08/20   [name], [name], MD  simethicone (MYLICON) 80 MG chewable tablet Chew 1 tablet (80 mg total) by mouth 3 (three) times daily. 06/08/20   [name], [name], MD  simvastatin (ZOCOR) 20 MG tablet Take 1 tablet (20 mg total) by mouth daily at 6 PM. Patient not taking: Reported on 07/23/2020 06/08/20   [name], [name], MD  tamsulosin (FLOMAX) 0.4 MG CAPS capsule Take 2 capsules (0.8 mg total) by mouth daily after supper. Patient not taking: Reported on 07/23/2020 06/08/20   Gonzella Lex, MD    Physical Exam: Vitals:   08/04/20 1900 08/04/20 1904 08/04/20 2000 08/04/20 2005  BP: 119/81  118/65   Pulse: 62 72 60 61  Resp: 16 18 19 19   Temp:       TempSrc:      SpO2: 91% 93%  92%  Exam limited due to hypersomnolence. Constitutional: Resting supine in bed, excessively somnolent, snoring Eyes: PERRL, lids and conjunctivae normal ENMT: Mucous membranes are moist. Posterior pharynx clear of any exudate or lesions.Normal dentition.  Neck: normal, supple, no masses. Respiratory: clear to auscultation anteriorly.  Normal respiratory effort. No accessory muscle use.  Cardiovascular: Regular rate and rhythm, no murmurs / rubs / gallops. No extremity edema. Abdomen: no tenderness, no masses palpated. Bowel sounds positive.  Musculoskeletal: no clubbing / cyanosis. No joint deformity upper and lower extremities. Good ROM, no contractures. Normal muscle tone.  Skin: Laceration above right eyebrow, healing without open wound or active bleeding.  Late stage bruising below right eye. Neurologic: Limited due to hypersomnolence.  Sensation appears intact, downgoing Babinski, withdraws to painful stimuli Psychiatric: Hypersomnolent.  Labs on Admission: I have personally reviewed following labs and imaging studies  CBC: Recent Labs  Lab 08/04/20 1424  WBC 4.0  HGB 14.1  HCT 44.2  MCV 94.6  PLT 630*   Basic Metabolic Panel: Recent Labs  Lab 08/04/20 1424  NA 140  K 4.0  CL 100  CO2 28  GLUCOSE 105*  BUN 13  CREATININE 0.68  CALCIUM 8.6*   GFR: Estimated Creatinine Clearance: 109 mL/min (by C-G formula based on SCr of 0.68 mg/dL). Liver Function Tests: No results for input(s): AST, ALT, ALKPHOS, BILITOT, PROT, ALBUMIN in the last 168 hours. No results for input(s): LIPASE, AMYLASE in the last 168 hours. Recent Labs  Lab 08/04/20 1730  AMMONIA 28   Coagulation Profile: No results for input(s): INR, PROTIME in the last 168 hours. Cardiac Enzymes: No results for input(s): CKTOTAL, CKMB, CKMBINDEX, TROPONINI in the last 168 hours. BNP (last 3 results) No results for input(s): PROBNP in the last 8760 hours. HbA1C: No results  for input(s): HGBA1C in the last 72 hours. CBG: No results for input(s): GLUCAP in the last 168 hours. Lipid Profile: No results for input(s): CHOL, HDL, LDLCALC, TRIG, CHOLHDL, LDLDIRECT in the last 72 hours. Thyroid Function Tests: No results for input(s): TSH, T4TOTAL, FREET4, T3FREE, THYROIDAB in the last 72 hours. Anemia Panel: No results for input(s): VITAMINB12, FOLATE, FERRITIN,  TIBC, IRON, RETICCTPCT in the last 72 hours. Urine analysis:    Component Value Date/Time   COLORURINE AMBER (A) 08/04/2020 1756   APPEARANCEUR CLEAR (A) 08/04/2020 1756   APPEARANCEUR Clear 01/09/2013 1152   LABSPEC 1.027 08/04/2020 1756   LABSPEC 1.021 01/09/2013 1152   PHURINE 5.0 08/04/2020 1756   GLUCOSEU NEGATIVE 08/04/2020 1756   GLUCOSEU Negative 01/09/2013 1152   HGBUR NEGATIVE 08/04/2020 1756   BILIRUBINUR NEGATIVE 08/04/2020 1756   BILIRUBINUR Negative 01/09/2013 1152   KETONESUR 5 (A) 08/04/2020 1756   PROTEINUR 30 (A) 08/04/2020 1756   NITRITE NEGATIVE 08/04/2020 1756   LEUKOCYTESUR NEGATIVE 08/04/2020 1756   LEUKOCYTESUR Negative 01/09/2013 1152    Radiological Exams on Admission: DG Chest 2 View  Result Date: 08/04/2020 CLINICAL DATA:  Shortness of breath EXAM: CHEST - 2 VIEW COMPARISON:  None. FINDINGS: The heart size and mediastinal contours are within normal limits. Rounded patchy airspace opacity seen at the left lung base. The right lung is clear. The visualized skeletal structures are unremarkable. IMPRESSION: Patchy airspace opacity at the left lung base which could be due to atelectasis and/or infectious etiology. Electronically Signed   By: Prudencio Pair M.D.   On: 08/04/2020 14:41   CT Head Wo Contrast  Result Date: 08/04/2020 CLINICAL DATA:  Fall, bruising to right forehead EXAM: CT HEAD WITHOUT CONTRAST TECHNIQUE: Contiguous axial images were obtained from the base of the skull through the vertex without intravenous contrast. COMPARISON:  07/18/2020 FINDINGS: Brain: There  is atrophy and chronic small vessel disease changes. No acute intracranial abnormality. Specifically, no hemorrhage, hydrocephalus, mass lesion, acute infarction, or significant intracranial injury. Vascular: No hyperdense vessel or unexpected calcification. Skull: No acute calvarial abnormality. Sinuses/Orbits: Mucosal thickening throughout the paranasal sinuses. No air-fluid levels. Other: None IMPRESSION: Atrophy, chronic microvascular disease. No acute intracranial abnormality. Chronic sinusitis Electronically Signed   By: Rolm Baptise M.D.   On: 08/04/2020 17:56   CT Cervical Spine Wo Contrast  Result Date: 08/04/2020 CLINICAL DATA:  Fall EXAM: CT CERVICAL SPINE WITHOUT CONTRAST TECHNIQUE: Multidetector CT imaging of the cervical spine was performed without intravenous contrast. Multiplanar CT image reconstructions were also generated. COMPARISON:  None. FINDINGS: Alignment: No subluxation. Skull base and vertebrae: No acute fracture. No primary bone lesion or focal pathologic process. Soft tissues and spinal canal: No prevertebral fluid or swelling. No visible canal hematoma. Disc levels: Diffuse degenerative disc disease with disc space narrowing and spurring. Diffuse degenerative facet disease. Upper chest: No acute findings Other: None IMPRESSION: Diffuse degenerative disc and facet disease. No acute bony abnormality. Electronically Signed   By: Rolm Baptise M.D.   On: 08/04/2020 17:57    EKG: Personally reviewed.  Normal sinus rhythm with motion artifact.  Assessment/Plan Principal Problem:   Pneumonia due to COVID-19 virus Active Problems:   Schizoaffective disorder, bipolar type ([place])   Hypertension   Controlled type 2 diabetes mellitus without complication ([place])   Encephalopathy acute  Alexander Beard is a 64 y.o. male with medical history significant for schizoaffective disorder, bipolar type, autism spectrum disorder, COPD, T2DM, HTN, HLD who is admitted with COVID-19 pneumonia and  hypersomnolence.  COVID-19 pneumonia: SARS-CoV-2 PCR + 08/04/2020.  Chest x-ray with evidence of developing left lower lobe pneumonia.  Currently saturating well on 2 L supplemental O2 via [place].  Brother states that patient has received 2 COVID-19 vaccine doses but unsure if he has received the booster yet. -Start IV Solu-Medrol 40 mg twice daily -Start IV remdesivir -Incentive spirometer,  flutter valve and albuterol as needed -Supplemental oxygen as needed  Encephalopathy/excessively somnolent: Suspect multifactorial due to medication and COVID-19 viral illness.  Hold all home medications for now.  Schizoaffective disorder: Recent prolonged behavioral health stay.  Brother states that he has been very groggy since recent change in medications.  Home medications on hold for now as above.  Follows with psychiatry, [name].  Thrombocytopenia: Mild without obvious bleeding.  Continue to monitor.  Type 2 diabetes: Home meds on hold.  Place on sensitive SSI q4h while NPO.  Hypertension: BP currently stable.  Holding home metoprolol.  DVT prophylaxis: Lovenox Code Status: Full code, confirmed with patient's brother Family Communication: Discussed with patient's brother by phone Disposition Plan: From group home and likely discharge to group home pending clinical progress Consults called: None Level of care: Med-Surg Admission status:  Status is: Observation  The patient remains OBS appropriate and will d/c before 2 midnights.  Dispo: The patient is from: Group home              Anticipated d/c is to: Group home              Anticipated d/c date is: 1 day              Patient currently is not medically stable to d/c.  Zada Finders MD [facility]  If 7PM-7AM, please contact night-coverage [email or web address]  08/04/2020, 8:50 PM

## 2020-08-04 NOTE — ED Notes (Signed)
Resumed care from robin rn.  Pt awake.  nsr on monitor.  Iv fluids infusing.  Pt on 3 liters oxygen [place].

## 2020-08-04 NOTE — Progress Notes (Signed)
Remdesivir - Pharmacy Brief Note    O:  ALT:  CXR:  SpO2: 92  % on RA    A/P:  Remdesivir 200 mg IVPB once followed by 100 mg IVPB daily x 4 days.   Sadey Yandell D 08/04/2020 9:36 PM

## 2020-08-04 NOTE — ED Notes (Signed)
Pt taken to xray. Will obtain blood work when pt returns.

## 2020-08-04 NOTE — ED Triage Notes (Addendum)
Pt comes via [facility] from home with c/o SOB. Pt placed on 4L [place] per EMS and O2 improved. EMS reports it was 92%RA at first and coarse crackles in lungs.  Pt had recent fall few days ago.  Pt has hx of autism

## 2020-08-04 NOTE — ED Notes (Signed)
Patient transported to CT 

## 2020-08-04 NOTE — ED Provider Notes (Signed)
[facility] Emergency Department Provider Note   ____________________________________________   Event Date/Time   First MD Initiated Contact with Patient 08/04/20 1712     (approximate)  I have reviewed the triage vital signs and the nursing notes.   HISTORY  Chief Complaint Shortness of Breath    HPI Alexander Beard is a 65 y.o. male with past medical history of hypertension, hyperlipidemia, diabetes, COPD, autistic spectrum disorder, and schizoaffective disorder who presents to the ED for shortness of breath and altered mental status.  Patient reportedly resides at a group home and EMS was called earlier today when patient complained of some difficulty breathing.  He was noted to have O2 sats of 92% on room air and placed on 4 L nasal cannula for comfort.  Following triage and initial labs, patient appears to have had a change in mental status.  He has mumbling speech pattern, does not open his eyes to voice, but does respond to pain.  Per previous evaluations, patient noted to have coherent speech in the past.        Past Medical History:  Diagnosis Date  . Anxiety   . Anxiety disorder due to known physiological condition    HOSPITALIZED 10/18  . Arthritis   . Autistic disorder, residual state   . COPD (chronic obstructive pulmonary disease) (HCC)   . Depression   . Developmental disorder   . Diabetes mellitus without complication (HCC)   . Dyslipidemia   . Esophageal reflux   . HOH (hard of hearing)    MILDLY  . Hypertension   . Obesity   . Overactive bladder   . Palpitations    ANXIETY  . Schizophrenia, schizoaffective (HCC)   . Sleep apnea   . Tremors of nervous system    HANDS DUE TO MEDICATIONS  . Urinary incontinence     Patient Active Problem List   Diagnosis Date Noted  . Pneumonia due to COVID-19 virus 08/04/2020  . Intellectual disability 03/06/2020  . At risk for elopement 03/06/2020  . Diabetes (HCC) 04/10/2015  .  Hypertension 04/10/2015  . Prostate hypertrophy 04/10/2015  . Schizoaffective disorder (HCC) 04/10/2015  . Enlarged prostate 04/10/2015  . Essential (primary) hypertension 04/10/2015  . Type 2 diabetes mellitus (HCC) 04/10/2015  . Controlled type 2 diabetes mellitus without complication (HCC) 04/10/2015  . Schizoaffective disorder, bipolar type (HCC) 12/29/2014  . Active autistic disorder 12/29/2014    Past Surgical History:  Procedure Laterality Date  . CATARACT EXTRACTION W/PHACO Left 11/14/2017   Procedure: CATARACT EXTRACTION PHACO AND INTRAOCULAR LENS PLACEMENT (IOC);  Surgeon: Galen Manila, MD;  Location: [facility] ORS;  Service: Ophthalmology;  Laterality: Left;  [place] 00:42 AP% 14.6 CDE 6.12 Fluid pack lot # [id number]  . COLONOSCOPY  01/28/2005  . COLONOSCOPY WITH PROPOFOL N/A 05/09/2016   Procedure: COLONOSCOPY WITH PROPOFOL;  Surgeon: Scot Jun, MD;  Location: [facility] ENDOSCOPY;  Service: Endoscopy;  Laterality: N/A;  . ESOPHAGOGASTRODUODENOSCOPY N/A 05/09/2016   Procedure: ESOPHAGOGASTRODUODENOSCOPY (EGD);  Surgeon: Scot Jun, MD;  Location: [facility] ENDOSCOPY;  Service: Endoscopy;  Laterality: N/A;  . FRACTURE SURGERY     ORIF SHOULDER  . TONSILLECTOMY      Prior to Admission medications   Medication Sig Start Date End Date Taking? Authorizing Provider  acetaminophen (TYLENOL) 650 MG CR tablet Take 650 mg by mouth every 12 (twelve) hours as needed for pain.    [provider]  aspirin EC 81 MG EC tablet Take 1 tablet (81  mg total) by mouth daily. Swallow whole. 06/09/20   [name], [name], MD  clonazePAM (KLONOPIN) 2 MG tablet Take 0.5 tablets (1 mg total) by mouth 4 (four) times daily. 06/30/20   [name], [name], MD  divalproex (DEPAKOTE ER) 500 MG 24 hr tablet Take 3 tablets (1,500 mg total) by mouth at bedtime. 06/08/20   [name], [name], MD  gabapentin (NEURONTIN) 100 MG capsule Take 1 capsule (100 mg total) by mouth 3 (three) times daily. 06/08/20    [name], [name], MD  hydrOXYzine (ATARAX/VISTARIL) 10 MG tablet Take 1 tablet (10 mg total) by mouth 3 (three) times daily as needed for anxiety. 06/08/20   [name], [name], MD  lamoTRIgine (LAMICTAL) 150 MG tablet Take 1 tablet (150 mg total) by mouth at bedtime. 06/08/20   [name], [name], MD  linagliptin (TRADJENTA) 5 MG TABS tablet Take 1 tablet (5 mg total) by mouth daily. Patient not taking: Reported on 07/23/2020 06/09/20   [name], [name], MD  loratadine (CLARITIN) 10 MG tablet Take 1 tablet (10 mg total) by mouth daily. 06/08/20   [name], [name], MD  LORazepam (ATIVAN) 2 MG tablet Take 1 tablet (2 mg total) by mouth every 6 (six) hours as needed (severe anxiety). 06/08/20   [name], [name], MD  metFORMIN (GLUCOPHAGE) 850 MG tablet Take 1 tablet (850 mg total) by mouth 2 (two) times daily with a meal. 06/08/20   [name], [name], MD  metoprolol succinate (TOPROL-XL) 50 MG 24 hr tablet Take 1 tablet (50 mg total) by mouth daily. 06/09/20   [name], [name], MD  oxybutynin (DITROPAN) 5 MG tablet Take 2 tablets (10 mg total) by mouth 2 (two) times daily. 06/08/20   [name], [name], MD  pantoprazole (PROTONIX) 40 MG tablet Take 1 tablet (40 mg total) by mouth daily. 06/08/20   [name], [name], MD  prazosin (MINIPRESS) 1 MG capsule Take 1 capsule (1 mg total) by mouth at bedtime. 06/08/20   [name], [name], MD  QUEtiapine (SEROQUEL) 100 MG tablet Take 1 tablet (100 mg total) by mouth 3 (three) times daily. 06/30/20   [name], [name], MD  QUEtiapine (SEROQUEL) 400 MG tablet Take 1 tablet (400 mg total) by mouth at bedtime. 06/08/20   [name], [name], MD  sertraline (ZOLOFT) 50 MG tablet Take 1 tablet (50 mg total) by mouth 2 (two) times daily. Patient not taking: Reported on 07/23/2020 06/08/20   [name], [name], MD  simethicone (MYLICON) 80 MG chewable tablet Chew 1 tablet (80 mg total) by mouth 3 (three) times daily. 06/08/20   [name], [name], MD  simvastatin (ZOCOR) 20 MG tablet Take 1 tablet (20  mg total) by mouth daily at 6 PM. Patient not taking: Reported on 07/23/2020 06/08/20   [name], [name], MD  tamsulosin (FLOMAX) 0.4 MG CAPS capsule Take 2 capsules (0.8 mg total) by mouth daily after supper. Patient not taking: Reported on 07/23/2020 06/08/20   [name], [name], MD    Allergies Other, Penicillins, and Cortizone-10 [hydrocortisone]  Family History  Problem Relation Age of Onset  . Hypertension Mother   . Stroke Father   . Heart Problems Father     Social History Social History   Tobacco Use  . Smoking status: Never Smoker  . Smokeless tobacco: Never Used  Vaping Use  . Vaping Use: Never used  Substance Use Topics  . Alcohol use: No    Alcohol/week: 0.0 standard drinks  . Drug use:  No    Review of Systems Unable to obtain secondary to altered mental status  ____________________________________________   PHYSICAL EXAM:  VITAL SIGNS: ED Triage Vitals  Enc Vitals Group     BP 08/04/20 1352 104/65     Pulse Rate 08/04/20 1352 68     Resp 08/04/20 1352 18     Temp 08/04/20 1352 99.1 F (37.3 C)     Temp Source 08/04/20 1352 Oral     SpO2 08/04/20 1352 96 %     Weight --      Height --      Head Circumference --      Peak Flow --      Pain Score 08/04/20 1350 4     Pain Loc --      Pain Edu? --      Excl. in GC? --     Constitutional: Awake with mumbling verbal response. Eyes: Conjunctivae are normal.  Pupils equal round and reactive to light bilaterally. Head: Bruising above the right eye, no facial/scalp hematomas or bony step-offs. Nose: No congestion/rhinnorhea. Mouth/Throat: Mucous membranes are moist. Neck: Normal ROM Cardiovascular: Normal rate, regular rhythm. Grossly normal heart sounds. Respiratory: Normal respiratory effort.  No retractions. Lungs CTAB. Gastrointestinal: Soft and nontender. No distention. Genitourinary: deferred Musculoskeletal: No lower extremity tenderness nor edema. Neurologic: Does not open eyes to voice or  pain, mumbling and incoherent speech pattern.  Localizes to pain in all 4 extremities, very weak upon standing but able to bear weight. Skin:  Skin is warm, dry and intact. No rash noted. Psychiatric: Unable to assess.  ____________________________________________   LABS (all labs ordered are listed, but only abnormal results are displayed)  Labs Reviewed  SARS CORONAVIRUS 2 BY RT PCR (HOSPITAL ORDER, PERFORMED IN [facility]) - Abnormal; Notable for the following components:      Result Value   SARS Coronavirus 2 POSITIVE (*)    All other components within normal limits  BASIC METABOLIC PANEL - Abnormal; Notable for the following components:   Glucose, Bld 105 (*)    Calcium 8.6 (*)    All other components within normal limits  CBC - Abnormal; Notable for the following components:   Platelets 105 (*)    All other components within normal limits  LACTIC ACID, PLASMA - Abnormal; Notable for the following components:   Lactic Acid, Venous 2.3 (*)    All other components within normal limits  URINALYSIS, COMPLETE (UACMP) WITH MICROSCOPIC - Abnormal; Notable for the following components:   Color, Urine AMBER (*)    APPearance CLEAR (*)    Ketones, ur 5 (*)    Protein, ur 30 (*)    Bacteria, UA RARE (*)    All other components within normal limits  URINE DRUG SCREEN, QUALITATIVE ([facility] ONLY) - Abnormal; Notable for the following components:   Tricyclic, Ur Screen POSITIVE (*)    Benzodiazepine, Ur Scrn POSITIVE (*)    All other components within normal limits  BLOOD GAS, VENOUS - Abnormal; Notable for the following components:   pCO2, Ven 62 (*)    Bicarbonate 37.5 (*)    Acid-Base Excess 9.9 (*)    All other components within normal limits  CULTURE, BLOOD (ROUTINE X 2)  CULTURE, BLOOD (ROUTINE X 2)  PROCALCITONIN  AMMONIA  VALPROIC ACID LEVEL  LACTIC ACID, PLASMA  PROCALCITONIN  TROPONIN I (HIGH SENSITIVITY)  TROPONIN I (HIGH SENSITIVITY)    ____________________________________________  EKG  ED ECG REPORT I, Leonette Most  Tyler Robidoux, the attending physician, personally viewed and interpreted this ECG.   Date: 08/04/2020  EKG Time: 13:55  Rate: 69  Rhythm: normal sinus rhythm  Axis: Normal  Intervals:none  ST&T Change: None   PROCEDURES  Procedure(s) performed (including Critical Care):  Procedures   ____________________________________________   INITIAL IMPRESSION / ASSESSMENT AND PLAN / ED COURSE       65 year old male with past medical history of hypertension, hyperlipidemia, diabetes, COPD, autistic spectrum disorder, and schizoaffective disorder who presents to the ED initially for shortness of breath, now noted to have alteration in mental status.  He is not in any respiratory distress and is maintaining O2 sats on room air, but given his change in mental status we will check VBG.  We will also check CT head and C-spine as patient has some bruising over his right eye, has been seen for multiple falls in the past but unclear if he had a more recent fall.  Vital signs are reassuring and there is no leukocytosis, but we also check for infectious process with UA.  Chest x-ray reviewed by me and shows questionable infiltrate, we will check procalcitonin and tested for COVID-19.  CT head and C-spine are negative for acute process.  Remainder of labs are reassuring outside of mild lactic acidosis, however given no leukocytosis and negative procalcitonin, low suspicion for sepsis.  We will hydrate with IV fluids and recheck lactate.  Patient did test positive for COVID-19, is not in any respiratory distress and is maintaining O2 sats around 92% on room air.  Covid could be contributing to his alteration in mental status, however he is also on a number of medications that may be having an impact.  He remains very weak and altered, case discussed with hospitalist for admission.       ____________________________________________   FINAL CLINICAL IMPRESSION(S) / ED DIAGNOSES  Final diagnoses:  Altered mental status, unspecified altered mental status type  Shortness of breath  COVID-19     ED Discharge Orders    None       Note:  This document was prepared using Dragon voice recognition software and may include unintentional dictation errors.   Blake Divine, MD 08/04/20 2014

## 2020-08-05 DIAGNOSIS — G928 Other toxic encephalopathy: Secondary | ICD-10-CM | POA: Diagnosis present

## 2020-08-05 DIAGNOSIS — J1282 Pneumonia due to coronavirus disease 2019: Secondary | ICD-10-CM | POA: Diagnosis present

## 2020-08-05 DIAGNOSIS — Z7982 Long term (current) use of aspirin: Secondary | ICD-10-CM | POA: Diagnosis not present

## 2020-08-05 DIAGNOSIS — F70 Mild intellectual disabilities: Secondary | ICD-10-CM | POA: Diagnosis present

## 2020-08-05 DIAGNOSIS — D696 Thrombocytopenia, unspecified: Secondary | ICD-10-CM | POA: Diagnosis present

## 2020-08-05 DIAGNOSIS — R0602 Shortness of breath: Secondary | ICD-10-CM | POA: Diagnosis present

## 2020-08-05 DIAGNOSIS — Z7984 Long term (current) use of oral hypoglycemic drugs: Secondary | ICD-10-CM | POA: Diagnosis not present

## 2020-08-05 DIAGNOSIS — I10 Essential (primary) hypertension: Secondary | ICD-10-CM | POA: Diagnosis present

## 2020-08-05 DIAGNOSIS — Z8249 Family history of ischemic heart disease and other diseases of the circulatory system: Secondary | ICD-10-CM | POA: Diagnosis not present

## 2020-08-05 DIAGNOSIS — E119 Type 2 diabetes mellitus without complications: Secondary | ICD-10-CM | POA: Diagnosis present

## 2020-08-05 DIAGNOSIS — M47819 Spondylosis without myelopathy or radiculopathy, site unspecified: Secondary | ICD-10-CM | POA: Diagnosis present

## 2020-08-05 DIAGNOSIS — Z79899 Other long term (current) drug therapy: Secondary | ICD-10-CM | POA: Diagnosis not present

## 2020-08-05 DIAGNOSIS — G4710 Hypersomnia, unspecified: Secondary | ICD-10-CM | POA: Diagnosis present

## 2020-08-05 DIAGNOSIS — E876 Hypokalemia: Secondary | ICD-10-CM | POA: Diagnosis present

## 2020-08-05 DIAGNOSIS — F840 Autistic disorder: Secondary | ICD-10-CM | POA: Diagnosis present

## 2020-08-05 DIAGNOSIS — N3281 Overactive bladder: Secondary | ICD-10-CM | POA: Diagnosis present

## 2020-08-05 DIAGNOSIS — Z823 Family history of stroke: Secondary | ICD-10-CM | POA: Diagnosis not present

## 2020-08-05 DIAGNOSIS — F250 Schizoaffective disorder, bipolar type: Secondary | ICD-10-CM | POA: Diagnosis present

## 2020-08-05 DIAGNOSIS — R296 Repeated falls: Secondary | ICD-10-CM | POA: Diagnosis present

## 2020-08-05 DIAGNOSIS — U071 COVID-19: Secondary | ICD-10-CM | POA: Diagnosis present

## 2020-08-05 DIAGNOSIS — G9340 Encephalopathy, unspecified: Secondary | ICD-10-CM | POA: Diagnosis not present

## 2020-08-05 DIAGNOSIS — R4182 Altered mental status, unspecified: Secondary | ICD-10-CM | POA: Diagnosis not present

## 2020-08-05 DIAGNOSIS — E785 Hyperlipidemia, unspecified: Secondary | ICD-10-CM | POA: Diagnosis present

## 2020-08-05 DIAGNOSIS — Z880 Allergy status to penicillin: Secondary | ICD-10-CM | POA: Diagnosis not present

## 2020-08-05 DIAGNOSIS — Z888 Allergy status to other drugs, medicaments and biological substances status: Secondary | ICD-10-CM | POA: Diagnosis not present

## 2020-08-05 DIAGNOSIS — J440 Chronic obstructive pulmonary disease with acute lower respiratory infection: Secondary | ICD-10-CM | POA: Diagnosis present

## 2020-08-05 DIAGNOSIS — J9601 Acute respiratory failure with hypoxia: Secondary | ICD-10-CM | POA: Diagnosis present

## 2020-08-05 LAB — HEPATIC FUNCTION PANEL
ALT: 7 U/L (ref 0–44)
AST: 28 U/L (ref 15–41)
Albumin: 2.9 g/dL — ABNORMAL LOW (ref 3.5–5.0)
Alkaline Phosphatase: 61 U/L (ref 38–126)
Bilirubin, Direct: 0.2 mg/dL (ref 0.0–0.2)
Indirect Bilirubin: 0.5 mg/dL (ref 0.3–0.9)
Total Bilirubin: 0.7 mg/dL (ref 0.3–1.2)
Total Protein: 6 g/dL — ABNORMAL LOW (ref 6.5–8.1)

## 2020-08-05 LAB — CBG MONITORING, ED
Glucose-Capillary: 105 mg/dL — ABNORMAL HIGH (ref 70–99)
Glucose-Capillary: 92 mg/dL (ref 70–99)
Glucose-Capillary: 83 mg/dL (ref 70–99)
Glucose-Capillary: 82 mg/dL (ref 70–99)

## 2020-08-05 LAB — PROCALCITONIN: Procalcitonin: <0.1 ng/mL

## 2020-08-05 LAB — CBC WITH DIFFERENTIAL/PLATELET
Abs Immature Granulocytes: 0.02 10*3/uL (ref 0.00–0.07)
Basophils Absolute: 0 10*3/uL (ref 0.0–0.1)
Basophils Relative: 0 %
Eosinophils Absolute: 0 10*3/uL (ref 0.0–0.5)
Eosinophils Relative: 0 %
HCT: 38.5 % — ABNORMAL LOW (ref 39.0–52.0)
Hemoglobin: 12.2 g/dL — ABNORMAL LOW (ref 13.0–17.0)
Immature Granulocytes: 1 %
Lymphocytes Relative: 11 %
Lymphs Abs: 0.4 10*3/uL — ABNORMAL LOW (ref 0.7–4.0)
MCH: 30 pg (ref 26.0–34.0)
MCHC: 31.7 g/dL (ref 30.0–36.0)
MCV: 94.6 fL (ref 80.0–100.0)
Monocytes Absolute: 0.5 10*3/uL (ref 0.1–1.0)
Monocytes Relative: 15 %
Neutro Abs: 2.5 10*3/uL (ref 1.7–7.7)
Neutrophils Relative %: 73 %
Platelets: 105 10*3/uL — ABNORMAL LOW (ref 150–400)
RBC: 4.07 MIL/uL — ABNORMAL LOW (ref 4.22–5.81)
RDW: 14.6 % (ref 11.5–15.5)
WBC: 3.4 10*3/uL — ABNORMAL LOW (ref 4.0–10.5)
nRBC: 0 % (ref 0.0–0.2)

## 2020-08-05 LAB — COMPREHENSIVE METABOLIC PANEL
ALT: 7 U/L (ref 0–44)
AST: 22 U/L (ref 15–41)
Albumin: 2.6 g/dL — ABNORMAL LOW (ref 3.5–5.0)
Alkaline Phosphatase: 55 U/L (ref 38–126)
Anion gap: 9 (ref 5–15)
BUN: 13 mg/dL (ref 8–23)
CO2: 30 mmol/L (ref 22–32)
Calcium: 8.1 mg/dL — ABNORMAL LOW (ref 8.9–10.3)
Chloride: 101 mmol/L (ref 98–111)
Creatinine, Ser: 0.69 mg/dL (ref 0.61–1.24)
GFR, Estimated: >60 mL/min (ref >60)
Glucose, Bld: 118 mg/dL — ABNORMAL HIGH (ref 70–99)
Potassium: 4.4 mmol/L (ref 3.5–5.1)
Sodium: 140 mmol/L (ref 135–145)
Total Bilirubin: 0.6 mg/dL (ref 0.3–1.2)
Total Protein: 6 g/dL — ABNORMAL LOW (ref 6.5–8.1)

## 2020-08-05 LAB — PHOSPHORUS: Phosphorus: 3.5 mg/dL (ref 2.5–4.6)

## 2020-08-05 LAB — FIBRIN DERIVATIVES D-DIMER (ARMC ONLY): Fibrin derivatives D-dimer (ARMC): 2163.23 ng/mL (FEU) — ABNORMAL HIGH (ref 0.00–499.00)

## 2020-08-05 LAB — C-REACTIVE PROTEIN: CRP: 19.6 mg/dL — ABNORMAL HIGH (ref <1.0)

## 2020-08-05 LAB — FERRITIN: Ferritin: 183 ng/mL (ref 24–336)

## 2020-08-05 LAB — GLUCOSE, CAPILLARY: Glucose-Capillary: 86 mg/dL (ref 70–99)

## 2020-08-05 LAB — HEMOGLOBIN A1C
Hgb A1c MFr Bld: 6 % — ABNORMAL HIGH (ref 4.8–5.6)
Mean Plasma Glucose: 125.5 mg/dL

## 2020-08-05 LAB — MAGNESIUM: Magnesium: 2 mg/dL (ref 1.7–2.4)

## 2020-08-05 LAB — HIV ANTIBODY (ROUTINE TESTING W REFLEX): HIV Screen 4th Generation wRfx: NONREACTIVE

## 2020-08-05 MED ORDER — INSULIN ASPART 100 UNIT/ML ~~LOC~~ SOLN
0.0000 [IU] | Freq: Three times a day (TID) | SUBCUTANEOUS | Status: DC
  Administered 2020-08-06: 14:00:00 1 [IU] via SUBCUTANEOUS
  Administered 2020-08-07 – 2020-08-10 (×4): 2 [IU] via SUBCUTANEOUS
  Administered 2020-08-10: 14:00:00 5 [IU] via SUBCUTANEOUS
  Administered 2020-08-11: 2 [IU] via SUBCUTANEOUS
  Administered 2020-08-12: 13:00:00 3 [IU] via SUBCUTANEOUS
  Administered 2020-08-13: 13:00:00 1 [IU] via SUBCUTANEOUS
  Administered 2020-08-13: 3 [IU] via SUBCUTANEOUS
  Administered 2020-08-15 (×2): 2 [IU] via SUBCUTANEOUS
  Administered 2020-08-17 – 2020-08-18 (×2): 5 [IU] via SUBCUTANEOUS
  Administered 2020-08-19: 13:00:00 1 [IU] via SUBCUTANEOUS
  Administered 2020-08-19 – 2020-08-20 (×2): 2 [IU] via SUBCUTANEOUS
  Administered 2020-08-20: 17:00:00 1 [IU] via SUBCUTANEOUS
  Administered 2020-08-21: 2 [IU] via SUBCUTANEOUS
  Administered 2020-08-21: 16:00:00 1 [IU] via SUBCUTANEOUS
  Administered 2020-08-22 (×2): 2 [IU] via SUBCUTANEOUS
  Administered 2020-08-23: 3 [IU] via SUBCUTANEOUS
  Administered 2020-08-23: 1 [IU] via SUBCUTANEOUS
  Administered 2020-08-24 – 2020-08-25 (×2): 2 [IU] via SUBCUTANEOUS
  Administered 2020-08-25: 18:00:00 1 [IU] via SUBCUTANEOUS
  Administered 2020-08-26: 2 [IU] via SUBCUTANEOUS
  Administered 2020-08-27 – 2020-08-28 (×3): 1 [IU] via SUBCUTANEOUS
  Administered 2020-08-28: 17:00:00 3 [IU] via SUBCUTANEOUS
  Administered 2020-08-29 (×2): 1 [IU] via SUBCUTANEOUS
  Filled 2020-08-05 (×28): qty 1

## 2020-08-05 MED ORDER — CLONAZEPAM 1 MG PO TABS
1.0000 mg | ORAL_TABLET | Freq: Four times a day (QID) | ORAL | Status: DC
Start: 2020-08-05 — End: 2020-08-10
  Administered 2020-08-05 – 2020-08-10 (×13): 1 mg via ORAL
  Filled 2020-08-05 (×15): qty 1

## 2020-08-05 MED ORDER — OXYBUTYNIN CHLORIDE 5 MG PO TABS
10.0000 mg | ORAL_TABLET | Freq: Two times a day (BID) | ORAL | Status: DC
  Administered 2020-08-05 – 2020-09-09 (×70): 10 mg via ORAL
  Filled 2020-08-05 (×71): qty 2

## 2020-08-05 MED ORDER — SODIUM CHLORIDE 0.9 % IV SOLN: INTRAVENOUS | Status: DC

## 2020-08-05 MED ORDER — SIMETHICONE 80 MG PO CHEW
80.0000 mg | CHEWABLE_TABLET | Freq: Three times a day (TID) | ORAL | Status: DC
  Administered 2020-08-06 – 2020-09-09 (×94): 80 mg via ORAL
  Filled 2020-08-05 (×107): qty 1

## 2020-08-05 MED ORDER — LINAGLIPTIN 5 MG PO TABS
5.0000 mg | ORAL_TABLET | Freq: Every day | ORAL | Status: DC
  Administered 2020-08-06 – 2020-09-09 (×35): 5 mg via ORAL
  Filled 2020-08-05 (×39): qty 1

## 2020-08-05 MED ORDER — PANTOPRAZOLE SODIUM 40 MG PO TBEC
40.0000 mg | DELAYED_RELEASE_TABLET | Freq: Every day | ORAL | Status: DC
  Administered 2020-08-06 – 2020-09-09 (×35): 40 mg via ORAL
  Filled 2020-08-05 (×34): qty 1

## 2020-08-05 MED ORDER — ASPIRIN EC 81 MG PO TBEC
81.0000 mg | DELAYED_RELEASE_TABLET | Freq: Every day | ORAL | Status: DC
  Administered 2020-08-06 – 2020-09-09 (×35): 81 mg via ORAL
  Filled 2020-08-05 (×35): qty 1

## 2020-08-05 MED ORDER — INSULIN ASPART 100 UNIT/ML ~~LOC~~ SOLN
0.0000 [IU] | Freq: Every day | SUBCUTANEOUS | Status: DC
  Administered 2020-08-08 – 2020-08-15 (×4): 2 [IU] via SUBCUTANEOUS
  Filled 2020-08-05 (×5): qty 1

## 2020-08-05 MED ORDER — LAMOTRIGINE 100 MG PO TABS
150.0000 mg | ORAL_TABLET | Freq: Every day | ORAL | Status: DC
  Administered 2020-08-05 – 2020-09-08 (×35): 150 mg via ORAL
  Filled 2020-08-05 (×35): qty 2

## 2020-08-05 NOTE — ED Notes (Signed)
Pt sleeping, meds infusing nsr on monitor.

## 2020-08-05 NOTE — ED Notes (Signed)
Iv fluids infusing.  Pt resting with eyes closed.

## 2020-08-05 NOTE — ED Notes (Signed)
Pt's brother updated on pt's condition and current plan of care.

## 2020-08-05 NOTE — Progress Notes (Signed)
Pt present with ecchymosis in which appear to multiple stages of healing in multiple areas of the body to include the left hip, inner thighs and right eye. Hip has multiple bruises some of which is yellow in coloring and some of which is purple in coloring. When turning assessing pt tp assess. Pt teared up and asked NT if she was "going to hurt him?"

## 2020-08-05 NOTE — ED Notes (Signed)
Pt sleeping, moans out at times.   Iv fluids infusing.  Sinus on monitor.

## 2020-08-05 NOTE — Progress Notes (Signed)
Alexander Beard  ZYS:[id number] DOB: 07-17-1955 DOA: 08/04/2020 PCP: Remi Haggard, FNP    Brief Narrative:  65 year old with a history of schizoaffective disorder/bipolar type, autism, COPD, DM2, HTN, and HLD who presented to the ED with severe shortness of breath. On arrival he was found to have an oxygen saturation of 90% on room air. In the ED he was found to be Covid positive and a CXR noted infiltrates.  Significant Events:  1/25 admit via ED  Date of Positive COVID Test:  1/25  Vaccination Status: Vaccinated x2, no booster  COVID-19 specific Treatment: Remdesivir 1/25 > Steroid 1/25 >  Antimicrobials:  None  DVT prophylaxis: Lovenox  Subjective: Resting comfortably in bed.  In no acute respiratory distress.  Denies chest pain or abdominal pain.  Chooses to speak very little to me.  Is not in extremis.  Assessment & Plan:  COVID Pneumonia -acute hypoxic respiratory failure being dosed with Remdesivir and Solu-Medrol -appears to be stabilizing for now -monitor trend   Recent Labs  Lab 08/04/20 1630 08/04/20 1828 08/05/20 0359  FERRITIN  --   --  183  CRP  --   --  19.6*  ALT  --  7 7  PROCALCITON <0.10  --  <9.32    Toxic metabolic encephalopathy Likely related to above -follow closely -check T55, folic acid levels  Schizoaffective disorder longtime group home resident -continue usual home medications  Thrombocytopenia Appears to be a chronic issue, dating back at least as far as 2018 - follow trend  DM 2 CBG presently well controlled  HTN Blood pressure well controlled   Code Status: FULL CODE Family Communication:  Status is: Inpatient   The patient will require care spanning > 2 midnights and should be moved to inpatient because: Inpatient level of care appropriate due to severity of illness  Dispo: The patient is from: Group home              Anticipated d/c is to: Group home              Anticipated d/c date is: 2 days               Patient currently is not medically stable to d/c.   Difficult to place patient No  Consultants:  none  Objective: Blood pressure 114/61, pulse (!) 48, temperature 99.1 F (37.3 C), temperature source Oral, resp. rate 15, SpO2 96 %. No intake or output data in the 24 hours ending 08/05/20 1751 There were no vitals filed for this visit.  Examination: General: No acute respiratory distress Lungs: Clear to auscultation bilaterally without wheezes or crackles Cardiovascular: Regular rate and rhythm without murmur gallop or rub normal S1 and S2 Abdomen: Nontender, nondistended, soft, bowel sounds positive, no rebound, no ascites, no appreciable mass Extremities: No significant cyanosis, clubbing, or edema bilateral lower extremities  CBC: Recent Labs  Lab 08/04/20 1424 08/05/20 0359  WBC 4.0 3.4*  NEUTROABS  --  2.5  HGB 14.1 12.2*  HCT 44.2 38.5*  MCV 94.6 94.6  PLT 105* 732*   Basic Metabolic Panel: Recent Labs  Lab 08/04/20 1424 08/05/20 0359  NA 140 140  K 4.0 4.4  CL 100 101  CO2 28 30  GLUCOSE 105* 118*  BUN 13 13  CREATININE 0.68 0.69  CALCIUM 8.6* 8.1*  MG  --  2.0  PHOS  --  3.5   GFR: Estimated Creatinine Clearance: 109 mL/min (by C-G formula based on SCr of  0.69 mg/dL).  Liver Function Tests: Recent Labs  Lab 08/04/20 1828 08/05/20 0359  AST 28 22  ALT 7 7  ALKPHOS 61 55  BILITOT 0.7 0.6  PROT 6.0* 6.0*  ALBUMIN 2.9* 2.6*    Recent Labs  Lab 08/04/20 1730  AMMONIA 28    HbA1C: Hgb A1c MFr Bld  Date/Time Value Ref Range Status  08/05/2020 03:59 AM 6.0 (H) 4.8 - 5.6 % Final    Comment:    (NOTE) Pre diabetes:          5.7%-6.4%  Diabetes:              >6.4%  Glycemic control for   <7.0% adults with diabetes   02/20/2020 04:10 PM 6.6 (H) 4.8 - 5.6 % Final    Comment:    (NOTE) Pre diabetes:          5.7%-6.4%  Diabetes:              >6.4%  Glycemic control for   <7.0% adults with diabetes     CBG: Recent Labs  Lab  08/04/20 2327 08/05/20 0444 08/05/20 1024 08/05/20 1128 08/05/20 1734  GLUCAP 82 105* 92 83 82    Recent Results (from the past 240 hour(s))  Culture, blood (routine x 2)     Status: None (Preliminary result)   Collection Time: 08/04/20  5:30 PM   Specimen: BLOOD  Result Value Ref Range Status   Specimen Description BLOOD BLOOD LEFT FOREARM  Final   Special Requests   Final    BOTTLES DRAWN AEROBIC AND ANAEROBIC Blood Culture results may not be optimal due to an inadequate volume of blood received in culture bottles   Culture   Final    NO GROWTH < 24 HOURS Performed at [facility], [address] [place], [place] [address]    Report Status PENDING  Incomplete  SARS Coronavirus 2 by RT PCR (hospital order, performed in [place] hospital lab) Nasopharyngeal Nasopharyngeal Swab     Status: Abnormal   Collection Time: 08/04/20  5:56 PM   Specimen: Nasopharyngeal Swab  Result Value Ref Range Status   SARS Coronavirus 2 POSITIVE (A) NEGATIVE Final    Comment: RESULT CALLED TO, READ BACK BY AND VERIFIED WITH: ROBIN REGISTER,RN 1942 08/04/20 DB (NOTE) SARS-CoV-2 target nucleic acids are DETECTED  SARS-CoV-2 RNA is generally detectable in upper respiratory specimens  during the acute phase of infection.  Positive results are indicative  of the presence of the identified virus, but do not rule out bacterial infection or co-infection with other pathogens not detected by the test.  Clinical correlation with patient history and  other diagnostic information is necessary to determine patient infection status.  The expected result is negative.  Fact Sheet for Patients:   StrictlyIdeas.no   Fact Sheet for Healthcare Providers:   BankingDealers.co.za    This test is not yet approved or cleared by the [place] FDA and  has been authorized for detection and/or diagnosis of SARS-CoV-2 by FDA under an Emergency Use  Authorization (EUA).  This EUA will remain in effect (meaning this tes t can be used) for the duration of  the COVID-19 declaration under Section 564(b)(1) of the Act, 21 U.S.C. section 360-bbb-3(b)(1), unless the authorization is terminated or revoked sooner.  Performed at [facility], [address] [place], [place] [address]   Culture, blood (routine x 2)     Status: None (Preliminary result)   Collection Time: 08/04/20  5:57 PM   Specimen: BLOOD  Result Value Ref Range Status   Specimen Description BLOOD RIGHT ANTECUBITAL  Final   Special Requests   Final    BOTTLES DRAWN AEROBIC AND ANAEROBIC Blood Culture results may not be optimal due to an inadequate volume of blood received in culture bottles   Culture   Final    NO GROWTH < 24 HOURS Performed at [facility], [address] [place], [place] [address]    Report Status PENDING  Incomplete     Scheduled Meds: . vitamin C  500 mg Oral Daily  . enoxaparin (LOVENOX) injection  40 mg Subcutaneous Q24H  . insulin aspart  0-9 Units Subcutaneous Q4H  . methylPREDNISolone (SOLU-MEDROL) injection  40 mg Intravenous Q12H  . zinc sulfate  220 mg Oral Daily   Continuous Infusions: . remdesivir 100 mg in NS 100 mL Stopped (08/05/20 1052)     LOS: 0 days   Cherene Altes, MD [facility] Office  [phone] Pager - Text Page per Shea Evans  If 7PM-7AM, please contact night-coverage per Amion 08/05/2020, 5:51 PM

## 2020-08-05 NOTE — ED Notes (Signed)
Report off to austin rn 

## 2020-08-05 NOTE — ED Notes (Signed)
Pt now alert and sitting up in bed. Pt asking why he is in the hospital. Pt's test results and plan of care reviewed with patient at this time.

## 2020-08-05 NOTE — ED Notes (Signed)
CBG- 92 

## 2020-08-06 ENCOUNTER — Encounter: Payer: Self-pay | Admitting: Internal Medicine

## 2020-08-06 DIAGNOSIS — U071 COVID-19: Secondary | ICD-10-CM | POA: Diagnosis not present

## 2020-08-06 DIAGNOSIS — J1282 Pneumonia due to coronavirus disease 2019: Secondary | ICD-10-CM | POA: Diagnosis not present

## 2020-08-06 LAB — CBC WITH DIFFERENTIAL/PLATELET
Abs Immature Granulocytes: 0.02 10*3/uL (ref 0.00–0.07)
Basophils Absolute: 0 10*3/uL (ref 0.0–0.1)
Basophils Relative: 0 %
Eosinophils Absolute: 0 10*3/uL (ref 0.0–0.5)
Eosinophils Relative: 0 %
HCT: 39.8 % (ref 39.0–52.0)
Hemoglobin: 13.1 g/dL (ref 13.0–17.0)
Immature Granulocytes: 1 %
Lymphocytes Relative: 11 %
Lymphs Abs: 0.4 10*3/uL — ABNORMAL LOW (ref 0.7–4.0)
MCH: 30.8 pg (ref 26.0–34.0)
MCHC: 32.9 g/dL (ref 30.0–36.0)
MCV: 93.4 fL (ref 80.0–100.0)
Monocytes Absolute: 0.7 10*3/uL (ref 0.1–1.0)
Monocytes Relative: 20 %
Neutro Abs: 2.3 10*3/uL (ref 1.7–7.7)
Neutrophils Relative %: 68 %
Platelets: 130 10*3/uL — ABNORMAL LOW (ref 150–400)
RBC: 4.26 MIL/uL (ref 4.22–5.81)
RDW: 14.2 % (ref 11.5–15.5)
WBC: 3.4 10*3/uL — ABNORMAL LOW (ref 4.0–10.5)
nRBC: 0 % (ref 0.0–0.2)

## 2020-08-06 LAB — COMPREHENSIVE METABOLIC PANEL
ALT: 7 U/L (ref 0–44)
AST: 25 U/L (ref 15–41)
Albumin: 2.7 g/dL — ABNORMAL LOW (ref 3.5–5.0)
Alkaline Phosphatase: 54 U/L (ref 38–126)
Anion gap: 10 (ref 5–15)
BUN: 14 mg/dL (ref 8–23)
CO2: 30 mmol/L (ref 22–32)
Calcium: 8.8 mg/dL — ABNORMAL LOW (ref 8.9–10.3)
Chloride: 101 mmol/L (ref 98–111)
Creatinine, Ser: 0.69 mg/dL (ref 0.61–1.24)
GFR, Estimated: >60 mL/min (ref >60)
Glucose, Bld: 130 mg/dL — ABNORMAL HIGH (ref 70–99)
Potassium: 5 mmol/L (ref 3.5–5.1)
Sodium: 141 mmol/L (ref 135–145)
Total Bilirubin: 0.6 mg/dL (ref 0.3–1.2)
Total Protein: 6.1 g/dL — ABNORMAL LOW (ref 6.5–8.1)

## 2020-08-06 LAB — C-REACTIVE PROTEIN: CRP: 18.2 mg/dL — ABNORMAL HIGH (ref <1.0)

## 2020-08-06 LAB — GLUCOSE, CAPILLARY
Glucose-Capillary: 103 mg/dL — ABNORMAL HIGH (ref 70–99)
Glucose-Capillary: 137 mg/dL — ABNORMAL HIGH (ref 70–99)
Glucose-Capillary: 169 mg/dL — ABNORMAL HIGH (ref 70–99)
Glucose-Capillary: 135 mg/dL — ABNORMAL HIGH (ref 70–99)

## 2020-08-06 LAB — FIBRIN DERIVATIVES D-DIMER (ARMC ONLY): Fibrin derivatives D-dimer (ARMC): 2217.26 ng/mL (FEU) — ABNORMAL HIGH (ref 0.00–499.00)

## 2020-08-06 LAB — FERRITIN: Ferritin: 214 ng/mL (ref 24–336)

## 2020-08-06 LAB — PROCALCITONIN: Procalcitonin: <0.1 ng/mL

## 2020-08-06 NOTE — Plan of Care (Signed)
  Problem: Education: ?Goal: Knowledge of General Education information will improve ?Description: Including pain rating scale, medication(s)/side effects and non-pharmacologic comfort measures ?Outcome: Progressing ?  ?Problem: Health Behavior/Discharge Planning: ?Goal: Ability to manage health-related needs will improve ?Outcome: Progressing ?  ?Problem: Clinical Measurements: ?Goal: Will remain free from infection ?Outcome: Progressing ?  ?Problem: Clinical Measurements: ?Goal: Diagnostic test results will improve ?Outcome: Progressing ?  ?Problem: Clinical Measurements: ?Goal: Respiratory complications will improve ?Outcome: Progressing ?  ?Problem: Clinical Measurements: ?Goal: Cardiovascular complication will be avoided ?Outcome: Progressing ?  ?

## 2020-08-06 NOTE — Progress Notes (Signed)
Alexander Beard  QIO:[id number] DOB: 08-15-1955 DOA: 08/04/2020 PCP: Remi Haggard, FNP    Brief Narrative:  [phone] with a history of schizoaffective disorder/bipolar type, autism, COPD, DM2, HTN, and HLD who presented to the ED with severe shortness of breath. On arrival he was found to have an oxygen saturation of 90% on room air. In the ED he was found to be CoViD positive and a CXR noted infiltrates.  Significant Events:  1/25 admit via ED  Date of Positive COVID Test:  08/04/20  Vaccination Status: Vaccinated x2, no booster  COVID-19 specific Treatment: Remdesivir 1/25 > Steroid 1/25 >  Antimicrobials:  None  DVT prophylaxis: Lovenox  Subjective: Afebrile. Saturations in mid 90s on O2 support. Much more alert today. Is fearful and anxious. Denies cp, n/v, or abdom pain.   Assessment & Plan:  COVID Pneumonia -acute hypoxic respiratory failure being dosed with Remdesivir and Solu-Medrol - appears to be stabilizing for now - monitor trend w/ supportive care   Recent Labs  Lab 08/04/20 1630 08/04/20 1828 08/05/20 0359 08/06/20 0439  FERRITIN  --   --  183 214  CRP  --   --  19.6*  --   ALT  --  7 7 7   PROCALCITON <0.10  --  <0.10 <0.10    Toxic metabolic encephalopathy Likely related to above - follow closely - check U72, folic acid levels - appears to be improving rapidly   Schizoaffective disorder longtime group home resident - continue usual home medications  Thrombocytopenia Appears to be a chronic issue, dating back at least as far as 2018 - follow trend  DM 2 CBG presently well controlled  HTN Blood pressure well controlled   Code Status: FULL CODE Family Communication:  Status is: Inpatient   The patient will require care spanning > 2 midnights and should be moved to inpatient because: Inpatient level of care appropriate due to severity of illness  Dispo: The patient is from: Group home              Anticipated d/c is to: Group home               Anticipated d/c date is: 2 days              Patient currently is not medically stable to d/c.   Difficult to place patient No  Consultants:  none  Objective: Blood pressure 111/77, pulse 72, temperature 97.8 F (36.6 C), temperature source Oral, resp. rate (!) 22, SpO2 94 %.  Intake/Output Summary (Last 24 hours) at 08/06/2020 0815 Last data filed at 08/06/2020 0250 Gross per 24 hour  Intake -  Output 250 ml  Net -250 ml   There were no vitals filed for this visit.  Examination: General: No acute respiratory distress Lungs: fine bilateral crackles - no wheezing  Cardiovascular: RRR - no M Abdomen: NT/ND, soft, bs+, no mass  Extremities: no C/C/E B LE   CBC: Recent Labs  Lab 08/04/20 1424 08/05/20 0359 08/06/20 0439  WBC 4.0 3.4* 3.4*  NEUTROABS  --  2.5 2.3  HGB 14.1 12.2* 13.1  HCT 44.2 38.5* 39.8  MCV 94.6 94.6 93.4  PLT 105* 105* 536*   Basic Metabolic Panel: Recent Labs  Lab 08/04/20 1424 08/05/20 0359 08/06/20 0439  NA 140 140 141  K 4.0 4.4 5.0  CL 100 101 101  CO2 28 30 30   GLUCOSE 105* 118* 130*  BUN 13 13 14   CREATININE 0.68 0.69  0.69  CALCIUM 8.6* 8.1* 8.8*  MG  --  2.0  --   PHOS  --  3.5  --    GFR: Estimated Creatinine Clearance: 109 mL/min (by C-G formula based on SCr of 0.69 mg/dL).  Liver Function Tests: Recent Labs  Lab 08/04/20 1828 08/05/20 0359 08/06/20 0439  AST 28 22 25   ALT 7 7 7   ALKPHOS 61 55 54  BILITOT 0.7 0.6 0.6  PROT 6.0* 6.0* 6.1*  ALBUMIN 2.9* 2.6* 2.7*    Recent Labs  Lab 08/04/20 1730  AMMONIA 28    HbA1C: Hgb A1c MFr Bld  Date/Time Value Ref Range Status  08/05/2020 03:59 AM 6.0 (H) 4.8 - 5.6 % Final    Comment:    (NOTE) Pre diabetes:          5.7%-6.4%  Diabetes:              >6.4%  Glycemic control for   <7.0% adults with diabetes   02/20/2020 04:10 PM 6.6 (H) 4.8 - 5.6 % Final    Comment:    (NOTE) Pre diabetes:          5.7%-6.4%  Diabetes:              >6.4%  Glycemic  control for   <7.0% adults with diabetes     CBG: Recent Labs  Lab 08/05/20 0444 08/05/20 1024 08/05/20 1128 08/05/20 1734 08/05/20 2127  GLUCAP 105* 92 83 82 86    Recent Results (from the past 240 hour(s))  Culture, blood (routine x 2)     Status: None (Preliminary result)   Collection Time: 08/04/20  5:30 PM   Specimen: BLOOD  Result Value Ref Range Status   Specimen Description BLOOD BLOOD LEFT FOREARM  Final   Special Requests   Final    BOTTLES DRAWN AEROBIC AND ANAEROBIC Blood Culture results may not be optimal due to an inadequate volume of blood received in culture bottles   Culture   Final    NO GROWTH 2 DAYS Performed at [facility], [address] [place], [place] [address]    Report Status PENDING  Incomplete  SARS Coronavirus 2 by RT PCR (hospital order, performed in [place] hospital lab) Nasopharyngeal Nasopharyngeal Swab     Status: Abnormal   Collection Time: 08/04/20  5:56 PM   Specimen: Nasopharyngeal Swab  Result Value Ref Range Status   SARS Coronavirus 2 POSITIVE (A) NEGATIVE Final    Comment: RESULT CALLED TO, READ BACK BY AND VERIFIED WITH: ROBIN REGISTER,RN 1942 08/04/20 DB (NOTE) SARS-CoV-2 target nucleic acids are DETECTED  SARS-CoV-2 RNA is generally detectable in upper respiratory specimens  during the acute phase of infection.  Positive results are indicative  of the presence of the identified virus, but do not rule out bacterial infection or co-infection with other pathogens not detected by the test.  Clinical correlation with patient history and  other diagnostic information is necessary to determine patient infection status.  The expected result is negative.  Fact Sheet for Patients:   StrictlyIdeas.no   Fact Sheet for Healthcare Providers:   BankingDealers.co.za    This test is not yet approved or cleared by the [place] FDA and  has been authorized for detection  and/or diagnosis of SARS-CoV-2 by FDA under an Emergency Use Authorization (EUA).  This EUA will remain in effect (meaning this tes t can be used) for the duration of  the COVID-19 declaration under Section 564(b)(1) of the  Act, 21 U.S.C. section 360-bbb-3(b)(1), unless the authorization is terminated or revoked sooner.  Performed at [facility], [address] [place], [place] [address]   Culture, blood (routine x 2)     Status: None (Preliminary result)   Collection Time: 08/04/20  5:57 PM   Specimen: BLOOD  Result Value Ref Range Status   Specimen Description BLOOD RIGHT ANTECUBITAL  Final   Special Requests   Final    BOTTLES DRAWN AEROBIC AND ANAEROBIC Blood Culture results may not be optimal due to an inadequate volume of blood received in culture bottles   Culture   Final    NO GROWTH 2 DAYS Performed at [facility], [address] [place], [place] [address]    Report Status PENDING  Incomplete     Scheduled Meds: . vitamin C  500 mg Oral Daily  . aspirin EC  81 mg Oral Daily  . clonazePAM  1 mg Oral QID  . enoxaparin (LOVENOX) injection  40 mg Subcutaneous Q24H  . insulin aspart  0-5 Units Subcutaneous QHS  . insulin aspart  0-9 Units Subcutaneous TID WC  . lamoTRIgine  150 mg Oral QHS  . linagliptin  5 mg Oral Daily  . methylPREDNISolone (SOLU-MEDROL) injection  40 mg Intravenous Q12H  . oxybutynin  10 mg Oral BID  . pantoprazole  40 mg Oral Daily  . simethicone  80 mg Oral TID with meals  . zinc sulfate  220 mg Oral Daily   Continuous Infusions: . sodium chloride 75 mL/hr at 08/05/20 2243  . remdesivir 100 mg in NS 100 mL Stopped (08/05/20 1052)     LOS: 1 day   Cherene Altes, MD [facility] Office  [phone] Pager - Text Page per Amion  If 7PM-7AM, please contact night-coverage per Amion 08/06/2020, 8:15 AM

## 2020-08-06 NOTE — Evaluation (Signed)
Occupational Therapy Evaluation Patient Details Name: Alexander Beard MRN: [id number] DOB: 1955-09-29 Today's Date: 08/06/2020    History of Present Illness 65 y.o. male with medical history significant for schizoaffective disorder, bipolar type, autism spectrum disorder, COPD, T2DM, HTN, HLD who presents to the ED for evaluation of shortness of breath. Covid-19+ on 08/04/20.     Per brother, patient recently had a prolonged behavioral health hospital stay from Aug - Dec 2021 before discharged to group care home in [place] which is a new environment for him.  Brother states that he has noted significant "grogginess" over the last few weeks since medication schedule changes (4x/day to 2x/day).  This has coincided with several falls that have not resulted in any significant injuries.   Clinical Impression   Pt seen for OT evaluation on this date. Upon arrival to room pt awake and seated upright in bed. Pt A&O x 2 reporting no pain. When oriented to situation, pt became tearful, stating "I am going to die here"; OT provided comfort and pt was agreeable to participating in session. Pt was pleasant and cooperative throughout session, but difficult to obtain PLOF d/t cognition and communication difficulties. PLOF was obtained via chart review and phone call with brother (see below). This session, pt required MOD A for LB dressing, MIN GUARD for bed mobility, and MIN A for sit>stand transfers. Pt was able to participate in x20 reps of standing marching with RW, requiring MOD A for balance. Pt would benefit from skilled OT services to maximize return to PLOF and minimize risk of future falls, injury, caregiver burden, and readmission. Will continue to follow POC. Discharge recommendation remains appropriate.     Follow Up Recommendations  SNF    Equipment Recommendations  Other (comment) (defer to next venue of care)       Precautions / Restrictions Precautions Precautions: Fall Restrictions Weight  Bearing Restrictions: No      Mobility Bed Mobility Overal bed mobility: Needs Assistance Bed Mobility: Supine to Sit;Sit to Supine     Supine to sit: Min guard Sit to supine: Min guard   General bed mobility comments: Increased time/effort    Transfers Overall transfer level: Needs assistance Equipment used: Rolling walker (2 wheeled) Transfers: Sit to/from Stand Sit to Stand: Min assist         General transfer comment: Able to perform x2 sit>stand transfers with MIN A. Required verbal cues for RW management    Balance Overall balance assessment: Needs assistance Sitting-balance support: No upper extremity supported;Feet supported Sitting balance-Leahy Scale: Good Sitting balance - Comments: Sitting EOB   Standing balance support: Bilateral upper extremity supported;During functional activity Standing balance-Leahy Scale: Poor Standing balance comment: Significant UE reliance on RW for support while marching in place for 10 reps x2                           ADL either performed or assessed with clinical judgement   ADL Overall ADL's : Needs assistance/impaired     Grooming: Wash/dry face;Moderate assistance;Bed level Grooming Details (indicate cue type and reason): Required verbal cues for readjusting [place]. Required increased assistance for thoroughness             Lower Body Dressing: Moderate assistance;Bed level Lower Body Dressing Details (indicate cue type and reason): Pt put forth great effort t/o task; able to doff socks independently, however required assistance to initiate donning  Pertinent Vitals/Pain Pain Assessment: No/denies pain        Extremity/Trunk Assessment Upper Extremity Assessment Upper Extremity Assessment: Generalized weakness;Difficult to assess due to impaired cognition   Lower Extremity Assessment Lower Extremity Assessment: Generalized weakness;Difficult to assess due  to impaired cognition (Difficult to formally assess, however pt brother reports that pt has LE neuropathy at baseline, which has contributed to his hx of falls)       Communication     Cognition Arousal/Alertness: Awake/alert Behavior During Therapy: WFL for tasks assessed/performed Overall Cognitive Status: Impaired/Different from baseline Area of Impairment: Orientation;Attention;Memory;Following commands;Safety/judgement                 Orientation Level: Situation;Time Current Attention Level: Focused Memory: Decreased recall of precautions Following Commands: Follows one step commands inconsistently Safety/Judgement: Decreased awareness of safety;Decreased awareness of deficits     General Comments: A&Ox2. Pt's brother reports that based on his phone call with pt, he believes pt's cognition is different than usual, and that pt is usually able to hold conversations. Pt unable to speak full coherant sentences this date. Pt was following 1-step directions inconsistently during this session      Exercises General Exercises - Lower Extremity Hip Flexion/Marching: AROM;Strengthening;Both;20 reps;Standing   Shoulder Instructions      Home Living Family/patient expects to be discharged to:: Group home                                 Additional Comments: Difficult to obtain PLOF d/t pt's cognition. Per chart review, pt was living in a group home      Prior Functioning/Environment Level of [place]: Needs assistance  Gait / Transfers Assistance Needed: Per brother, pt was not using any DME for functional mobility prior to his Aug-Dec hospitalization, and is unsure how pt has been moving since last admission. Pt's brother reports that pt has LE neuropathy at baseline, which has contributed to his hx of falls. Reports that he has fallen much more frequently since living in the group home ADL's / Homemaking Assistance Needed: Pt's brother reports that he needed  assistance with donning compression socks prior to his Aug-Dec hospitalization, and is unsure how much assistance he was receiving for dressing in group home. Per pt's brother, pt needed MIN-MOD A for bathing to ensure thoroughness            OT Problem List: Decreased strength;Decreased activity tolerance;Impaired balance (sitting and/or standing);Decreased cognition;Decreased safety awareness;Decreased knowledge of precautions      OT Treatment/Interventions: Self-care/ADL training;Therapeutic exercise;Energy conservation;DME and/or AE instruction;Therapeutic activities;Patient/family education;Balance training    OT Goals(Current goals can be found in the care plan section) Acute Rehab OT Goals OT Goal Formulation: Patient unable to participate in goal setting Time For Goal Achievement: 08/20/20 Potential to Achieve Goals: Good ADL Goals Pt Will Perform Grooming: with supervision;sitting Pt Will Perform Upper Body Dressing: with supervision;sitting Pt Will Transfer to Toilet: with min guard assist;squat pivot transfer;bedside commode  OT Frequency: Min 2X/week    AM-PAC OT "6 Clicks" Daily Activity     Outcome Measure Help from another person eating meals?: None Help from another person taking care of personal grooming?: A Little Help from another person toileting, which includes using toliet, bedpan, or urinal?: A Lot Help from another person bathing (including washing, rinsing, drying)?: A Lot Help from another person to put on and taking off regular upper body clothing?: A Little Help from another  person to put on and taking off regular lower body clothing?: A Lot 6 Click Score: 16   End of Session Equipment Utilized During Treatment: Gait belt;Rolling walker Nurse Communication: Mobility status  Activity Tolerance: Patient tolerated treatment well Patient left: in bed;with call bell/phone within reach;with chair alarm set  OT Visit Diagnosis: Unsteadiness on feet  (R26.81);History of falling (Z91.81)                Time: [id number] OT Time Calculation (min): 45 min Charges:  OT General Charges $OT Visit: 1 Visit OT Evaluation $OT Eval Moderate Complexity: 1 Mod OT Treatments $Self Care/Home Management : 8-22 mins $Therapeutic Activity: 8-22 mins  Fredirick Maudlin, OTR/L [place]

## 2020-08-06 NOTE — Progress Notes (Deleted)
Pt arrived to 329 via bed and is currently sleeping. Pt is not in any distress at this time sitter at bedside.

## 2020-08-07 DIAGNOSIS — J1282 Pneumonia due to coronavirus disease 2019: Secondary | ICD-10-CM | POA: Diagnosis not present

## 2020-08-07 DIAGNOSIS — U071 COVID-19: Secondary | ICD-10-CM | POA: Diagnosis not present

## 2020-08-07 LAB — CBC WITH DIFFERENTIAL/PLATELET
Abs Immature Granulocytes: 0.06 10*3/uL (ref 0.00–0.07)
Basophils Absolute: 0 10*3/uL (ref 0.0–0.1)
Basophils Relative: 0 %
Eosinophils Absolute: 0 10*3/uL (ref 0.0–0.5)
Eosinophils Relative: 0 %
HCT: 37.8 % — ABNORMAL LOW (ref 39.0–52.0)
Hemoglobin: 12.8 g/dL — ABNORMAL LOW (ref 13.0–17.0)
Immature Granulocytes: 2 %
Lymphocytes Relative: 13 %
Lymphs Abs: 0.5 10*3/uL — ABNORMAL LOW (ref 0.7–4.0)
MCH: 31 pg (ref 26.0–34.0)
MCHC: 33.9 g/dL (ref 30.0–36.0)
MCV: 91.5 fL (ref 80.0–100.0)
Monocytes Absolute: 0.7 10*3/uL (ref 0.1–1.0)
Monocytes Relative: 20 %
Neutro Abs: 2.5 10*3/uL (ref 1.7–7.7)
Neutrophils Relative %: 65 %
Platelets: 162 10*3/uL (ref 150–400)
RBC: 4.13 MIL/uL — ABNORMAL LOW (ref 4.22–5.81)
RDW: 13.9 % (ref 11.5–15.5)
WBC: 3.8 10*3/uL — ABNORMAL LOW (ref 4.0–10.5)
nRBC: 0 % (ref 0.0–0.2)

## 2020-08-07 LAB — GLUCOSE, CAPILLARY
Glucose-Capillary: 167 mg/dL — ABNORMAL HIGH (ref 70–99)
Glucose-Capillary: 175 mg/dL — ABNORMAL HIGH (ref 70–99)
Glucose-Capillary: 138 mg/dL — ABNORMAL HIGH (ref 70–99)
Glucose-Capillary: 140 mg/dL — ABNORMAL HIGH (ref 70–99)

## 2020-08-07 LAB — COMPREHENSIVE METABOLIC PANEL
ALT: 10 U/L (ref 0–44)
AST: 28 U/L (ref 15–41)
Albumin: 2.6 g/dL — ABNORMAL LOW (ref 3.5–5.0)
Alkaline Phosphatase: 56 U/L (ref 38–126)
Anion gap: 11 (ref 5–15)
BUN: 16 mg/dL (ref 8–23)
CO2: 29 mmol/L (ref 22–32)
Calcium: 8.4 mg/dL — ABNORMAL LOW (ref 8.9–10.3)
Chloride: 101 mmol/L (ref 98–111)
Creatinine, Ser: 0.75 mg/dL (ref 0.61–1.24)
GFR, Estimated: >60 mL/min (ref >60)
Glucose, Bld: 146 mg/dL — ABNORMAL HIGH (ref 70–99)
Potassium: 4.3 mmol/L (ref 3.5–5.1)
Sodium: 141 mmol/L (ref 135–145)
Total Bilirubin: 0.6 mg/dL (ref 0.3–1.2)
Total Protein: 6.2 g/dL — ABNORMAL LOW (ref 6.5–8.1)

## 2020-08-07 LAB — VITAMIN B12: Vitamin B-12: 832 pg/mL (ref 180–914)

## 2020-08-07 LAB — C-REACTIVE PROTEIN: CRP: 13.7 mg/dL — ABNORMAL HIGH (ref <1.0)

## 2020-08-07 LAB — FOLATE: Folate: 6.9 ng/mL (ref >5.9)

## 2020-08-07 LAB — FERRITIN: Ferritin: 186 ng/mL (ref 24–336)

## 2020-08-07 LAB — FIBRIN DERIVATIVES D-DIMER (ARMC ONLY): Fibrin derivatives D-dimer (ARMC): 2111.88 ng/mL (FEU) — ABNORMAL HIGH (ref 0.00–499.00)

## 2020-08-07 MED ORDER — TRAZODONE HCL 50 MG PO TABS
25.0000 mg | ORAL_TABLET | Freq: Every evening | ORAL | Status: DC | PRN
  Administered 2020-08-07 – 2020-08-08 (×2): 25 mg via ORAL
  Filled 2020-08-07 (×2): qty 1

## 2020-08-07 MED ORDER — PREDNISONE 20 MG PO TABS
40.0000 mg | ORAL_TABLET | Freq: Every day | ORAL | Status: DC
  Administered 2020-08-08 – 2020-08-10 (×3): 40 mg via ORAL
  Filled 2020-08-07 (×3): qty 2

## 2020-08-07 MED ORDER — HALOPERIDOL LACTATE 5 MG/ML IJ SOLN
1.0000 mg | Freq: Four times a day (QID) | INTRAMUSCULAR | Status: DC | PRN
  Administered 2020-08-07 – 2020-08-09 (×3): 1 mg via INTRAMUSCULAR
  Filled 2020-08-07 (×3): qty 1

## 2020-08-07 NOTE — Care Management Important Message (Signed)
Important Message  Patient Details  Name: Alexander Beard MRN: [id number] Date of Birth: 1956-01-05   Medicare Important Message Given:  N/A - LOS <3 / Initial given by admissions     [name] 08/07/2020, 12:32 PM

## 2020-08-07 NOTE — Evaluation (Signed)
Physical Therapy Evaluation Patient Details Name: SHIVAN HODES MRN: [id number] DOB: Dec 01, 1955 Today's Date: 08/07/2020   History of Present Illness  65 y.o. male with medical history significant for schizoaffective disorder, bipolar type, autism spectrum disorder, COPD, T2DM, HTN, HLD who presents to the ED for evaluation of shortness of breath. Covid-19+ on 08/04/20.     Per brother, patient recently had a prolonged behavioral health hospital stay from Aug - Dec 2021 before discharged to group care home in [place] which is a new environment for him.  Brother states that he has noted significant "grogginess" over the last few weeks since medication schedule changes (4x/day to 2x/day).  This has coincided with several falls that have not resulted in any significant injuries.  Clinical Impression  Patient resting in bed upon arrival to room; alert and oriented to self only.  Follows simple commands, but generally impulsive with mobility.  Very limited insight into deficits and overall safety needs.  Bilat UE/LE strength and ROM grossly symmetrical and WFL; no focal weakness.  However, marked difficulty with coordination/motor control of bilat UEs with functional activities; difficulty breaking extensor moments at times.  Currently requiring mod assist for bed mobility; min assist for sit/stand, basic transfers and gait (30') with RW.  Demonstrates very short, shuffling steps; hand-over-hand guidance for walker management (unable to negotiate turns/obstacles without); maintains arms-length anterior to patient with arms fully extended at all times.  Limited balance, safety; requiring physical assist for safety at all times Would benefit from skilled PT to address above deficits and promote optimal return to PLOF.; recommend transition to STR upon discharge from acute hospitalization.     Follow Up Recommendations SNF    Equipment Recommendations  Rolling walker with 5" wheels    Recommendations  for Other Services       Precautions / Restrictions Precautions Precautions: Fall Restrictions Weight Bearing Restrictions: No      Mobility  Bed Mobility Overal bed mobility: Needs Assistance Bed Mobility: Supine to Sit     Supine to sit: Mod assist     General bed mobility comments: assist for truncal elevation from bed surfacec    Transfers Overall transfer level: Needs assistance Equipment used: Rolling walker (2 wheeled) Transfers: Sit to/from Stand Sit to Stand: Min assist         General transfer comment: pulls on RW despite cuing; difficuty coordinating movement of UEs at times  Ambulation/Gait Ambulation/Gait assistance: Min assist;Mod assist Gait Distance (Feet): 30 Feet Assistive device: Rolling walker (2 wheeled)       General Gait Details: very short, shuffling steps; hand-over-hand guidance for walker management (unable to negotiate turns/obstacles without); maintains arms-length anterior to patient with arms fully extended at all times.  Limited balance, safety; requiring physical assist for safety at all times  Stairs            Wheelchair Mobility    Modified Rankin (Stroke Patients Only)       Balance Overall balance assessment: Needs assistance Sitting-balance support: No upper extremity supported;Feet supported Sitting balance-Leahy Scale: Good     Standing balance support: Bilateral upper extremity supported Standing balance-Leahy Scale: Poor Standing balance comment: Significant UE reliance on RW                             Pertinent Vitals/Pain Pain Assessment: No/denies pain    Home Living Family/patient expects to be discharged to:: Group home  Additional Comments: Difficult to obtain PLOF d/t pt's cognition. Per chart review, pt was living in a group home    Prior Function Level of [place]: Needs assistance   Gait / Transfers Assistance Needed: Per brother, pt was not using  any DME for functional mobility prior to his Aug-Dec hospitalization, and is unsure how pt has been moving since last admission. Pt's brother reports that pt has LE neuropathy at baseline, which has contributed to his hx of falls. Reports that he has fallen much more frequently since living in the group home           Hand Dominance        Extremity/Trunk Assessment   Upper Extremity Assessment Upper Extremity Assessment: Generalized weakness    Lower Extremity Assessment Lower Extremity Assessment: Generalized weakness (grossly 4-/5 throughout)       Communication   Communication:  (intermittent perseverative in speech; difficulty with answering targeted questions, maintaining topic of conversation)  Cognition Arousal/Alertness: Awake/alert Behavior During Therapy: Impulsive Overall Cognitive Status: No family/caregiver present to determine baseline cognitive functioning                                 General Comments: Oriented to self only; follows simple commands.  Generally impulsive with limited insight into deficits, safety needs      General Comments      Exercises Other Exercises Other Exercises: Sit/stand from bed/recliner, min assist; bed/chair transfers iwht RW, min assist   Assessment/Plan    PT Assessment Patient needs continued PT services  PT Problem List Decreased mobility;Decreased balance;Decreased activity tolerance;Decreased strength;Decreased coordination;Decreased cognition;Decreased knowledge of use of DME;Decreased safety awareness;Decreased knowledge of precautions;Cardiopulmonary status limiting activity       PT Treatment Interventions DME instruction;Gait training;Functional mobility training;Therapeutic activities;Cognitive remediation;Therapeutic exercise;Balance training;Patient/family education    PT Goals (Current goals can be found in the Care Plan section)  Acute Rehab PT Goals PT Goal Formulation: Patient unable to  participate in goal setting Time For Goal Achievement: 08/21/20 Potential to Achieve Goals: Fair    Frequency Min 2X/week   Barriers to discharge        Co-evaluation               AM-PAC PT "6 Clicks" Mobility  Outcome Measure Help needed turning from your back to your side while in a flat bed without using bedrails?: None Help needed moving from lying on your back to sitting on the side of a flat bed without using bedrails?: A Lot Help needed moving to and from a bed to a chair (including a wheelchair)?: A Little Help needed standing up from a chair using your arms (e.g., wheelchair or bedside chair)?: A Little Help needed to walk in hospital room?: A Little Help needed climbing 3-5 steps with a railing? : A Little 6 Click Score: 18    End of Session Equipment Utilized During Treatment: Gait belt;Oxygen Activity Tolerance: Patient tolerated treatment well Patient left: in chair;with call bell/phone within reach;with chair alarm set Nurse Communication: Mobility status PT Visit Diagnosis: Muscle weakness (generalized) (M62.81);Difficulty in walking, not elsewhere classified (R26.2)    Time: 1505-6979 PT Time Calculation (min) (ACUTE ONLY): 34 min   Charges:   PT Evaluation $PT Eval Moderate Complexity: 1 Mod PT Treatments $Therapeutic Activity: 8-22 mins        Jailen Coward H. Owens Shark, PT, DPT, NCS 08/07/20, 8:58 PM [phone]

## 2020-08-07 NOTE — Progress Notes (Signed)
Alexander Beard  IRC:[id number] DOB: 06-18-1956 DOA: 08/04/2020 PCP: Remi Haggard, FNP    Brief Narrative:  [id number] with a history of schizoaffective disorder/bipolar type, autism, COPD, DM2, HTN, and HLD who presented to the ED with severe shortness of breath. On arrival he was found to have an oxygen saturation of 90% on room air. In the ED he was found to be CoViD positive and a CXR noted infiltrates.  Significant Events:  1/25 admit via ED  Date of Positive COVID Test:  08/04/20  Vaccination Status: Vaccinated x2, no booster  COVID-19 specific Treatment: Remdesivir 1/25 > 1/29 Steroid 1/25 >  Antimicrobials:  None  DVT prophylaxis: Lovenox  Subjective: Afebrile.  Vital signs stable.  Oxygen saturation 93-96% on 2 L conventional nasal cannula. Sitting up in bedside chair. Respirations appear more comfortable. Denies new complaints but hx is unreliable.   Assessment & Plan:  COVID Pneumonia -acute hypoxic respiratory failure being dosed with Remdesivir and Solu-Medrol - appears to be stabilizing - monitor trend w/ supportive care -wean oxygen as able -mobilize  Recent Labs  Lab 08/04/20 1630 08/04/20 1828 08/05/20 0359 08/06/20 0439 08/07/20 0414  FERRITIN  --   --  183 214 186  CRP  --   --  19.6* 18.2*  --   ALT  --  7 7 7 10   PROCALCITON <0.10  --  <0.10 <0.10  --     Toxic metabolic encephalopathy Likely related to above - follow closely - folic acid level normal - B12 pending - appears to be improving rapidly   Schizoaffective disorder longtime group home resident - continue usual home medications  Thrombocytopenia Appears to be a chronic issue, dating back at least as far as 2018 - stable/improving   DM 2 CBG controlled  HTN Blood pressure well controlled   Code Status: FULL CODE Family Communication:  Status is: Inpatient   The patient will require care spanning > 2 midnights and should be moved to inpatient because: Inpatient level of care  appropriate due to severity of illness  Dispo: The patient is from: Group home              Anticipated d/c is to: Group home              Anticipated d/c date is: 2 days              Patient currently is not medically stable to d/c.   Difficult to place patient No  Consultants:  none  Objective: Blood pressure 106/70, pulse 74, temperature 98 F (36.7 C), resp. rate 18, SpO2 93 %.  Intake/Output Summary (Last 24 hours) at 08/07/2020 0820 Last data filed at 08/07/2020 0500 Gross per 24 hour  Intake -  Output 900 ml  Net -900 ml   There were no vitals filed for this visit.  Examination: General: No acute respiratory distress - alert and conversant  Lungs: fine bilateral crackles w/o wheezing  Cardiovascular: RRR - no M or rub  Abdomen: NT/ND, soft, bs+, no mass  Extremities: no C/C/E B lower extremities    CBC: Recent Labs  Lab 08/05/20 0359 08/06/20 0439 08/07/20 0414  WBC 3.4* 3.4* 3.8*  NEUTROABS 2.5 2.3 2.5  HGB 12.2* 13.1 12.8*  HCT 38.5* 39.8 37.8*  MCV 94.6 93.4 91.5  PLT 105* 130* 585   Basic Metabolic Panel: Recent Labs  Lab 08/05/20 0359 08/06/20 0439 08/07/20 0414  NA 140 141 141  K 4.4 5.0 4.3  CL 101 101 101  CO2 30 30 29   GLUCOSE 118* 130* 146*  BUN 13 14 16   CREATININE 0.69 0.69 0.75  CALCIUM 8.1* 8.8* 8.4*  MG 2.0  --   --   PHOS 3.5  --   --    GFR: CrCl cannot be calculated (Unknown ideal weight.).  Liver Function Tests: Recent Labs  Lab 08/04/20 1828 08/05/20 0359 08/06/20 0439 08/07/20 0414  AST 28 22 25 28   ALT 7 7 7 10   ALKPHOS 61 55 54 56  BILITOT 0.7 0.6 0.6 0.6  PROT 6.0* 6.0* 6.1* 6.2*  ALBUMIN 2.9* 2.6* 2.7* 2.6*    Recent Labs  Lab 08/04/20 1730  AMMONIA 28    HbA1C: Hgb A1c MFr Bld  Date/Time Value Ref Range Status  08/05/2020 03:59 AM 6.0 (H) 4.8 - 5.6 % Final    Comment:    (NOTE) Pre diabetes:          5.7%-6.4%  Diabetes:              >6.4%  Glycemic control for   <7.0% adults with diabetes    02/20/2020 04:10 PM 6.6 (H) 4.8 - 5.6 % Final    Comment:    (NOTE) Pre diabetes:          5.7%-6.4%  Diabetes:              >6.4%  Glycemic control for   <7.0% adults with diabetes     CBG: Recent Labs  Lab 08/06/20 0823 08/06/20 1200 08/06/20 1646 08/06/20 2050 08/07/20 0800  GLUCAP 103* 137* 169* 135* 167*    Recent Results (from the past 240 hour(s))  Culture, blood (routine x 2)     Status: None (Preliminary result)   Collection Time: 08/04/20  5:30 PM   Specimen: BLOOD  Result Value Ref Range Status   Specimen Description BLOOD BLOOD LEFT FOREARM  Final   Special Requests   Final    BOTTLES DRAWN AEROBIC AND ANAEROBIC Blood Culture results may not be optimal due to an inadequate volume of blood received in culture bottles   Culture   Final    NO GROWTH 3 DAYS Performed at [facility], [address] [place], [place] [address]    Report Status PENDING  Incomplete  SARS Coronavirus 2 by RT PCR (hospital order, performed in [place] hospital lab) Nasopharyngeal Nasopharyngeal Swab     Status: Abnormal   Collection Time: 08/04/20  5:56 PM   Specimen: Nasopharyngeal Swab  Result Value Ref Range Status   SARS Coronavirus 2 POSITIVE (A) NEGATIVE Final    Comment: RESULT CALLED TO, READ BACK BY AND VERIFIED WITH: ROBIN REGISTER,RN 1942 08/04/20 DB (NOTE) SARS-CoV-2 target nucleic acids are DETECTED  SARS-CoV-2 RNA is generally detectable in upper respiratory specimens  during the acute phase of infection.  Positive results are indicative  of the presence of the identified virus, but do not rule out bacterial infection or co-infection with other pathogens not detected by the test.  Clinical correlation with patient history and  other diagnostic information is necessary to determine patient infection status.  The expected result is negative.  Fact Sheet for Patients:   StrictlyIdeas.no   Fact Sheet for Healthcare Providers:    BankingDealers.co.za    This test is not yet approved or cleared by the [place] FDA and  has been authorized for detection and/or diagnosis of SARS-CoV-2 by FDA under an Emergency Use Authorization (EUA).  This EUA  will remain in effect (meaning this tes t can be used) for the duration of  the COVID-19 declaration under Section 564(b)(1) of the Act, 21 U.S.C. section 360-bbb-3(b)(1), unless the authorization is terminated or revoked sooner.  Performed at [facility], [address] [place], [place] [address]   Culture, blood (routine x 2)     Status: None (Preliminary result)   Collection Time: 08/04/20  5:57 PM   Specimen: BLOOD  Result Value Ref Range Status   Specimen Description BLOOD RIGHT ANTECUBITAL  Final   Special Requests   Final    BOTTLES DRAWN AEROBIC AND ANAEROBIC Blood Culture results may not be optimal due to an inadequate volume of blood received in culture bottles   Culture   Final    NO GROWTH 3 DAYS Performed at [name], [address] [place], [place] [address]    Report Status PENDING  Incomplete     Scheduled Meds: . vitamin C  500 mg Oral Daily  . aspirin EC  81 mg Oral Daily  . clonazePAM  1 mg Oral QID  . enoxaparin (LOVENOX) injection  40 mg Subcutaneous Q24H  . insulin aspart  0-5 Units Subcutaneous QHS  . insulin aspart  0-9 Units Subcutaneous TID WC  . lamoTRIgine  150 mg Oral QHS  . linagliptin  5 mg Oral Daily  . methylPREDNISolone (SOLU-MEDROL) injection  40 mg Intravenous Q12H  . oxybutynin  10 mg Oral BID  . pantoprazole  40 mg Oral Daily  . simethicone  80 mg Oral TID with meals  . zinc sulfate  220 mg Oral Daily   Continuous Infusions: . sodium chloride 50 mL/hr at 08/06/20 2004  . remdesivir 100 mg in NS 100 mL 100 mg (08/06/20 1049)     LOS: 2 days   Cherene Altes, MD [facility] Office  [phone] Pager - Text Page per Amion  If 7PM-7AM, please contact  night-coverage per Amion 08/07/2020, 8:20 AM

## 2020-08-07 NOTE — Progress Notes (Signed)
Occupational Therapy Treatment Patient Details Name: Alexander Beard MRN: [id number] DOB: [birth year] Today's Date: 08/07/2020    History of present illness 65 y.o. male with medical history significant for schizoaffective disorder, bipolar type, autism spectrum disorder, COPD, T2DM, HTN, HLD who presents to the ED for evaluation of shortness of breath. Covid-19+ on 08/04/20.     Per brother, patient recently had a prolonged behavioral health hospital stay from Aug - Dec 2021 before discharged to group care home in [place] which is a new environment for him.  Brother states that he has noted significant "grogginess" over the last few weeks since medication schedule changes (4x/day to 2x/day).  This has coincided with several falls that have not resulted in any significant injuries.   OT comments  Pt seen for OT treatment this date to f/u re: safety with ADLs/ADL mobility. Pt found to have gotten up to the restroom himself with chair alarm going off when OT presents for session RN notified. Pt may need seat belt alarm instead. OT facilitates pt participation in UB/LB bathing with MOD A sit<>Stand with RW with MIN verbal cues and UB dressing with MIN A to don clean gown. In addition, OT engages pt in MIN/MOD A commode transfer with RW from [facility], fxl mobility to sink for hand hygiene with SETUP/MIN A and fxl mobility back to chair. Pt left in chair with all needs in reach and RN presenting to administer medication. Continue to recommend SNF as safest f/u to improve performance with ADLs/ADL mobility and will continue to follow pt acutely.    Follow Up Recommendations  SNF    Equipment Recommendations  Other (comment) (defer)    Recommendations for Other Services      Precautions / Restrictions Precautions Precautions: Fall Restrictions Weight Bearing Restrictions: No       Mobility Bed Mobility               General bed mobility comments: pt up to restroom and then up to chair at end  of session  Transfers Overall transfer level: Needs assistance Equipment used: Rolling walker (2 wheeled) Transfers: Sit to/from Stand Sit to Stand: Min assist         General transfer comment: requires increased time, demos decreased motor planning/sequencing cues for obstacle avoidance    Balance Overall balance assessment: Needs assistance Sitting-balance support: No upper extremity supported;Feet supported Sitting balance-Leahy Scale: Good     Standing balance support: Bilateral upper extremity supported;During functional activity Standing balance-Leahy Scale: Poor Standing balance comment: Significant UE reliance on RW                           ADL either performed or assessed with clinical judgement   ADL Overall ADL's : Needs assistance/impaired     Grooming: Wash/dry face;Set up;Standing Grooming Details (indicate cue type and reason): sink-side standing to wash face with RW And MIN/MOD A For standing balance. Pt requires MOD A for oral care in sitting d/t more fine motor control required for this task. Upper Body Bathing: Moderate assistance;Sitting   Lower Body Bathing: Moderate assistance;Sit to/from stand Lower Body Bathing Details (indicate cue type and reason): using RW for satnding balance, MOD A for thorough completion of postioer LB bathing d/t difficulty coordinating the task Upper Body Dressing : Minimal assistance;Sitting       Toilet Transfer: Minimal assistance;Moderate assistance;BSC;RW;Ambulation Toilet Transfer Details (indicate cue type and reason): pt had gotten himself to the restroom and  the chair alarm was going off when OT presented. RN notified. OT assists pt with gait belt and walker back to chair safely. Toileting- Clothing Manipulation and Hygiene: Minimal assistance;Moderate assistance;Sit to/from stand       Functional mobility during ADLs: Minimal assistance;Moderate assistance;Rolling walker;Cueing for safety;Cueing for  sequencing (cues for obstaclea voidance)       Vision Patient Visual Report: No change from baseline     Perception     Praxis      Cognition Arousal/Alertness: Awake/alert Behavior During Therapy: WFL for tasks assessed/performed Overall Cognitive Status: No family/caregiver present to determine baseline cognitive functioning                                 General Comments: pt able to follow simple commands, he is oriented x2.        Exercises Other Exercises Other Exercises: OT facilitates pt participation in UB/LB bathing with MOD A sit<>Stand with RW with MIN verbal cues and UB dressing with MIN A to don clean gown. In addition, OT engages pt in MIN/MOD A commode transfer with RW from [facility], fxl mobility to sink for hand hygiene with SETUP/MIN A and fxl mobility back to chair. Pt left in chair with all needs in reach and RN presenting to administer medication.   Shoulder Instructions       General Comments      Pertinent Vitals/ Pain       Pain Assessment: No/denies pain  Home Living                                          Prior Functioning/Environment              Frequency  Min 2X/week        Progress Toward Goals  OT Goals(current goals can now be found in the care plan section)  Progress towards OT goals: Progressing toward goals  Acute Rehab OT Goals OT Goal Formulation: Patient unable to participate in goal setting Time For Goal Achievement: 08/20/20 Potential to Achieve Goals: Good  Plan Discharge plan remains appropriate    Co-evaluation                 AM-PAC OT "6 Clicks" Daily Activity     Outcome Measure   Help from another person eating meals?: None Help from another person taking care of personal grooming?: A Little Help from another person toileting, which includes using toliet, bedpan, or urinal?: A Lot Help from another person bathing (including washing, rinsing, drying)?: A Lot Help  from another person to put on and taking off regular upper body clothing?: A Little Help from another person to put on and taking off regular lower body clothing?: A Lot 6 Click Score: 16    End of Session Equipment Utilized During Treatment: Gait belt;Rolling walker  OT Visit Diagnosis: Unsteadiness on feet (R26.81);History of falling (Z91.81)   Activity Tolerance Patient tolerated treatment well   Patient Left in chair;with call bell/phone within reach;with chair alarm set   Nurse Communication Mobility status;Other (comment) (notified pt had gotten to restroom himself)        Time: 6295-2841 OT Time Calculation (min): 39 min  Charges: OT General Charges $OT Visit: 1 Visit OT Treatments $Self Care/Home Management : 23-37 mins $Therapeutic Activity: 8-22 mins  Bryson Ha  Jake Samples, MS, OTR/L ascom [phone] 08/07/20, 5:23 PM

## 2020-08-07 NOTE — Plan of Care (Signed)
Updated brother [name] via phone.
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
## 2020-08-07 NOTE — Progress Notes (Signed)
Patient's brother called and updated. Patient was happy to talk to his brother.

## 2020-08-08 DIAGNOSIS — J1282 Pneumonia due to coronavirus disease 2019: Secondary | ICD-10-CM | POA: Diagnosis not present

## 2020-08-08 DIAGNOSIS — U071 COVID-19: Secondary | ICD-10-CM | POA: Diagnosis not present

## 2020-08-08 LAB — FIBRIN DERIVATIVES D-DIMER (ARMC ONLY): Fibrin derivatives D-dimer (ARMC): 1588.88 ng/mL (FEU) — ABNORMAL HIGH (ref 0.00–499.00)

## 2020-08-08 LAB — FERRITIN: Ferritin: 194 ng/mL (ref 24–336)

## 2020-08-08 LAB — CBC WITH DIFFERENTIAL/PLATELET
Abs Immature Granulocytes: 0.06 10*3/uL (ref 0.00–0.07)
Basophils Absolute: 0 10*3/uL (ref 0.0–0.1)
Basophils Relative: 0 %
Eosinophils Absolute: 0 10*3/uL (ref 0.0–0.5)
Eosinophils Relative: 0 %
HCT: 36.3 % — ABNORMAL LOW (ref 39.0–52.0)
Hemoglobin: 12.1 g/dL — ABNORMAL LOW (ref 13.0–17.0)
Immature Granulocytes: 2 %
Lymphocytes Relative: 21 %
Lymphs Abs: 0.8 10*3/uL (ref 0.7–4.0)
MCH: 30.7 pg (ref 26.0–34.0)
MCHC: 33.3 g/dL (ref 30.0–36.0)
MCV: 92.1 fL (ref 80.0–100.0)
Monocytes Absolute: 0.8 10*3/uL (ref 0.1–1.0)
Monocytes Relative: 21 %
Neutro Abs: 2.2 10*3/uL (ref 1.7–7.7)
Neutrophils Relative %: 56 %
Platelets: 179 10*3/uL (ref 150–400)
RBC: 3.94 MIL/uL — ABNORMAL LOW (ref 4.22–5.81)
RDW: 13.9 % (ref 11.5–15.5)
Smear Review: NORMAL
WBC: 3.9 10*3/uL — ABNORMAL LOW (ref 4.0–10.5)
nRBC: 0 % (ref 0.0–0.2)

## 2020-08-08 LAB — COMPREHENSIVE METABOLIC PANEL
ALT: 9 U/L (ref 0–44)
AST: 24 U/L (ref 15–41)
Albumin: 2.7 g/dL — ABNORMAL LOW (ref 3.5–5.0)
Alkaline Phosphatase: 51 U/L (ref 38–126)
Anion gap: 8 (ref 5–15)
BUN: 15 mg/dL (ref 8–23)
CO2: 30 mmol/L (ref 22–32)
Calcium: 8.8 mg/dL — ABNORMAL LOW (ref 8.9–10.3)
Chloride: 106 mmol/L (ref 98–111)
Creatinine, Ser: 0.74 mg/dL (ref 0.61–1.24)
GFR, Estimated: >60 mL/min (ref >60)
Glucose, Bld: 146 mg/dL — ABNORMAL HIGH (ref 70–99)
Potassium: 4.7 mmol/L (ref 3.5–5.1)
Sodium: 144 mmol/L (ref 135–145)
Total Bilirubin: 0.5 mg/dL (ref 0.3–1.2)
Total Protein: 6 g/dL — ABNORMAL LOW (ref 6.5–8.1)

## 2020-08-08 LAB — GLUCOSE, CAPILLARY
Glucose-Capillary: 121 mg/dL — ABNORMAL HIGH (ref 70–99)
Glucose-Capillary: 111 mg/dL — ABNORMAL HIGH (ref 70–99)
Glucose-Capillary: 166 mg/dL — ABNORMAL HIGH (ref 70–99)
Glucose-Capillary: 241 mg/dL — ABNORMAL HIGH (ref 70–99)
Glucose-Capillary: 203 mg/dL — ABNORMAL HIGH (ref 70–99)

## 2020-08-08 NOTE — Progress Notes (Signed)
Pt's brother was updated on the phone

## 2020-08-08 NOTE — Progress Notes (Signed)
Alexander Beard  [id number] DOB: 1956-05-06 DOA: 08/04/2020 PCP: Remi Haggard, FNP    Brief Narrative:  [phone] with a history of schizoaffective disorder/bipolar type, autism, COPD, DM2, HTN, and HLD who presented to the ED with severe shortness of breath. On arrival he was found to have an oxygen saturation of 90% on room air. In the ED he was found to be CoViD positive and a CXR noted infiltrates.  Significant Events:  1/25 admit via ED  Date of Positive COVID Test:  08/04/20  Vaccination Status: Vaccinated x2, no booster  COVID-19 specific Treatment: Remdesivir 1/25 > 1/29 Steroid 1/25 >  Antimicrobials:  None  DVT prophylaxis: Lovenox  Subjective: Afebrile.  Vital signs stable otherwise.  Oxygen saturations 87-95% on room air.  PT/OT now suggesting he would benefit from SNF placement.   Assessment & Plan:  COVID Pneumonia -acute hypoxic respiratory failure being dosed with Remdesivir and Solu-Medrol - appears to be stabilizing - monitor trend w/ supportive care -wean oxygen as able -mobilize  Recent Labs  Lab 08/04/20 1630 08/04/20 1828 08/05/20 0359 08/06/20 0439 08/07/20 0414 08/08/20 0451  FERRITIN  --   --  183 214 186 194  CRP  --   --  19.6* 18.2* 13.7*  --   ALT  --  7 7 7 10 9   PROCALCITON <0.10  --  <0.10 <0.10  --   --     Toxic metabolic encephalopathy Likely related to above - folic acid level normal - B12 normal - has rapidly returned to his baseline   Schizoaffective disorder longtime group home resident - continue usual home medications  Thrombocytopenia Appears to be a chronic issue, dating back at least as far as 2018 - stable/improving   DM 2 CBG well controlled - A1c 6.0  HTN Blood pressure well controlled   Code Status: FULL CODE Family Communication:  Status is: Inpatient   The patient will require care spanning > 2 midnights and should be moved to inpatient because: Inpatient level of care appropriate due to severity  of illness  Dispo: The patient is from: Group home              Anticipated d/c is to: SNF              Anticipated d/c date is: 2 days              Patient currently is not medically stable to d/c.   Difficult to place patient No  Consultants:  none  Objective: Blood pressure 139/61, pulse (!) 41, temperature 98.1 F (36.7 C), resp. rate 16, SpO2 (!) 87 %. No intake or output data in the 24 hours ending 08/08/20 0817 There were no vitals filed for this visit.  Examination: General: No acute respiratory distress Lungs: fine bilateral crackles which are less pronounced  Cardiovascular: RRR  Abdomen: NT/ND, soft, bs+, no mass  Extremities: no C/C/E B LE  CBC: Recent Labs  Lab 08/06/20 0439 08/07/20 0414 08/08/20 0451  WBC 3.4* 3.8* 3.9*  NEUTROABS 2.3 2.5 2.2  HGB 13.1 12.8* 12.1*  HCT 39.8 37.8* 36.3*  MCV 93.4 91.5 92.1  PLT 130* 162 0000000   Basic Metabolic Panel: Recent Labs  Lab 08/05/20 0359 08/06/20 0439 08/07/20 0414 08/08/20 0451  NA 140 141 141 144  K 4.4 5.0 4.3 4.7  CL 101 101 101 106  CO2 30 30 29 30   GLUCOSE 118* 130* 146* 146*  BUN 13 14 16  15  CREATININE 0.69 0.69 0.75 0.74  CALCIUM 8.1* 8.8* 8.4* 8.8*  MG 2.0  --   --   --   PHOS 3.5  --   --   --    GFR: CrCl cannot be calculated (Unknown ideal weight.).  Liver Function Tests: Recent Labs  Lab 08/05/20 0359 08/06/20 0439 08/07/20 0414 08/08/20 0451  AST 22 25 28 24   ALT 7 7 10 9   ALKPHOS 55 54 56 51  BILITOT 0.6 0.6 0.6 0.5  PROT 6.0* 6.1* 6.2* 6.0*  ALBUMIN 2.6* 2.7* 2.6* 2.7*    Recent Labs  Lab 08/04/20 1730  AMMONIA 28    HbA1C: Hgb A1c MFr Bld  Date/Time Value Ref Range Status  08/05/2020 03:59 AM 6.0 (H) 4.8 - 5.6 % Final    Comment:    (NOTE) Pre diabetes:          5.7%-6.4%  Diabetes:              >6.4%  Glycemic control for   <7.0% adults with diabetes   02/20/2020 04:10 PM 6.6 (H) 4.8 - 5.6 % Final    Comment:    (NOTE) Pre diabetes:           5.7%-6.4%  Diabetes:              >6.4%  Glycemic control for   <7.0% adults with diabetes     CBG: Recent Labs  Lab 08/07/20 0800 08/07/20 1143 08/07/20 1612 08/07/20 2024 08/08/20 0806  GLUCAP 167* 175* 138* 140* 121*    Recent Results (from the past 240 hour(s))  Culture, blood (routine x 2)     Status: None (Preliminary result)   Collection Time: 08/04/20  5:30 PM   Specimen: BLOOD  Result Value Ref Range Status   Specimen Description BLOOD BLOOD LEFT FOREARM  Final   Special Requests   Final    BOTTLES DRAWN AEROBIC AND ANAEROBIC Blood Culture results may not be optimal due to an inadequate volume of blood received in culture bottles   Culture   Final    NO GROWTH 4 DAYS Performed at [facility], [address] [place], [place] [address]    Report Status PENDING  Incomplete  SARS Coronavirus 2 by RT PCR (hospital order, performed in [place] hospital lab) Nasopharyngeal Nasopharyngeal Swab     Status: Abnormal   Collection Time: 08/04/20  5:56 PM   Specimen: Nasopharyngeal Swab  Result Value Ref Range Status   SARS Coronavirus 2 POSITIVE (A) NEGATIVE Final    Comment: RESULT CALLED TO, READ BACK BY AND VERIFIED WITH: ROBIN REGISTER,RN 1942 08/04/20 DB (NOTE) SARS-CoV-2 target nucleic acids are DETECTED  SARS-CoV-2 RNA is generally detectable in upper respiratory specimens  during the acute phase of infection.  Positive results are indicative  of the presence of the identified virus, but do not rule out bacterial infection or co-infection with other pathogens not detected by the test.  Clinical correlation with patient history and  other diagnostic information is necessary to determine patient infection status.  The expected result is negative.  Fact Sheet for Patients:   StrictlyIdeas.no   Fact Sheet for Healthcare Providers:   BankingDealers.co.za    This test is not yet approved or cleared by  the [place] FDA and  has been authorized for detection and/or diagnosis of SARS-CoV-2 by FDA under an Emergency Use Authorization (EUA).  This EUA will remain in effect (meaning this tes t can be used) for  the duration of  the COVID-19 declaration under Section 564(b)(1) of the Act, 21 U.S.C. section 360-bbb-3(b)(1), unless the authorization is terminated or revoked sooner.  Performed at [facility], [address] [place], [place] [address]   Culture, blood (routine x 2)     Status: None (Preliminary result)   Collection Time: 08/04/20  5:57 PM   Specimen: BLOOD  Result Value Ref Range Status   Specimen Description BLOOD RIGHT ANTECUBITAL  Final   Special Requests   Final    BOTTLES DRAWN AEROBIC AND ANAEROBIC Blood Culture results may not be optimal due to an inadequate volume of blood received in culture bottles   Culture   Final    NO GROWTH 4 DAYS Performed at [facility], [address] [place], [place] [address]    Report Status PENDING  Incomplete     Scheduled Meds: . vitamin C  500 mg Oral Daily  . aspirin EC  81 mg Oral Daily  . clonazePAM  1 mg Oral QID  . enoxaparin (LOVENOX) injection  40 mg Subcutaneous Q24H  . insulin aspart  0-5 Units Subcutaneous QHS  . insulin aspart  0-9 Units Subcutaneous TID WC  . lamoTRIgine  150 mg Oral QHS  . linagliptin  5 mg Oral Daily  . oxybutynin  10 mg Oral BID  . pantoprazole  40 mg Oral Daily  . predniSONE  40 mg Oral Q breakfast  . simethicone  80 mg Oral TID with meals  . zinc sulfate  220 mg Oral Daily   Continuous Infusions: . remdesivir 100 mg in NS 100 mL Stopped (08/07/20 1724)     LOS: 3 days   Cherene Altes, MD [facility] Office  [phone] Pager - Text Page per Amion  If 7PM-7AM, please contact night-coverage per Amion 08/08/2020, 8:17 AM

## 2020-08-09 DIAGNOSIS — J1282 Pneumonia due to coronavirus disease 2019: Secondary | ICD-10-CM | POA: Diagnosis not present

## 2020-08-09 DIAGNOSIS — U071 COVID-19: Secondary | ICD-10-CM | POA: Diagnosis not present

## 2020-08-09 LAB — CULTURE, BLOOD (ROUTINE X 2)
Culture: NO GROWTH
Culture: NO GROWTH

## 2020-08-09 LAB — GLUCOSE, CAPILLARY
Glucose-Capillary: 122 mg/dL — ABNORMAL HIGH (ref 70–99)
Glucose-Capillary: 170 mg/dL — ABNORMAL HIGH (ref 70–99)
Glucose-Capillary: 161 mg/dL — ABNORMAL HIGH (ref 70–99)
Glucose-Capillary: 219 mg/dL — ABNORMAL HIGH (ref 70–99)

## 2020-08-09 MED ORDER — HYDROXYZINE HCL 10 MG PO TABS
10.0000 mg | ORAL_TABLET | Freq: Three times a day (TID) | ORAL | Status: DC | PRN
Start: 2020-08-09 — End: 2020-09-09
  Administered 2020-09-02 – 2020-09-08 (×7): 10 mg via ORAL
  Filled 2020-08-09 (×9): qty 1

## 2020-08-09 MED ORDER — DIVALPROEX SODIUM ER 500 MG PO TB24
1500.0000 mg | ORAL_TABLET | Freq: Every day | ORAL | Status: DC
  Administered 2020-08-09: 1500 mg via ORAL
  Filled 2020-08-09 (×2): qty 3

## 2020-08-09 MED ORDER — GABAPENTIN 100 MG PO CAPS
100.0000 mg | ORAL_CAPSULE | Freq: Three times a day (TID) | ORAL | Status: DC
  Administered 2020-08-09 – 2020-08-10 (×3): 100 mg via ORAL
  Filled 2020-08-09 (×3): qty 1

## 2020-08-09 MED ORDER — QUETIAPINE FUMARATE 25 MG PO TABS
100.0000 mg | ORAL_TABLET | Freq: Three times a day (TID) | ORAL | Status: DC
  Administered 2020-08-09 – 2020-09-09 (×89): 100 mg via ORAL
  Filled 2020-08-09 (×88): qty 4

## 2020-08-09 MED ORDER — QUETIAPINE FUMARATE 200 MG PO TABS
400.0000 mg | ORAL_TABLET | Freq: Every day | ORAL | Status: DC
  Administered 2020-08-09: 21:00:00 400 mg via ORAL
  Filled 2020-08-09 (×2): qty 2

## 2020-08-09 MED ORDER — LORAZEPAM 2 MG PO TABS
2.0000 mg | ORAL_TABLET | Freq: Four times a day (QID) | ORAL | Status: DC | PRN
  Administered 2020-08-09 – 2020-09-09 (×9): 2 mg via ORAL
  Filled 2020-08-09 (×10): qty 1

## 2020-08-09 NOTE — Progress Notes (Signed)
Patient found  sitting on the floor this am, no injury noted at this time, no complaints of pain. Patient states he were going to bathroom and wanted to see how strong he was. MD notified, family ( Brother ) notified, [facility] aware, vital sign WNL, will continue to monitor.

## 2020-08-09 NOTE — Progress Notes (Signed)
Alexander Beard  NLG:[id number] DOB: Nov 11, 1955 DOA: 08/04/2020 PCP: Remi Haggard, FNP    Brief Narrative:  [phone] with a history of schizoaffective disorder/bipolar type, autism, COPD, DM2, HTN, and HLD who presented to the ED with severe shortness of breath. On arrival he was found to have an oxygen saturation of 90% on room air. In the ED he was found to be CoViD positive and a CXR noted infiltrates.  Significant Events:  1/25 admit via ED  Date of Positive COVID Test:  08/04/20  Vaccination Status: Vaccinated x2, no booster  COVID-19 specific Treatment: Remdesivir 1/25 > 1/29 Steroid 1/25 >  Antimicrobials:  None  DVT prophylaxis: Lovenox  Subjective: Now consistently awake/alert with worsening psychosis.  Multiple sedating home medications have been on hold due to lethargy, but it is safe to resume these today. No resp distress. No uncontrolled pain.   Assessment & Plan:  COVID Pneumonia -acute hypoxic respiratory failure Has completed a course of Remdesivir -steroid weaning initiated - is stabilizing -monitor trend w/ supportive care -wean oxygen as able - mobilize  Toxic metabolic encephalopathy Likely related to above - folic acid level normal - B12 normal - has rapidly returned to his baseline -resume usual home medications today  Schizoaffective disorder longtime group home resident -appears to be suffering with a mild exacerbation related to holding of his home medications -resume all home medications today as his lethargy has resolved  Thrombocytopenia Appears to be a chronic issue, dating back at least as far as 2018 - stable/improving   DM 2 CBG well controlled - A1c 6.0  HTN Blood pressure well controlled   Code Status: FULL CODE Family Communication:  Status is: Inpatient   The patient will require care spanning > 2 midnights and should be moved to inpatient because: Unsafe d/c plan and Inpatient level of care appropriate due to severity of  illness  Dispo: The patient is from: Group home              Anticipated d/c is to: SNF              Anticipated d/c date is: 1 day              Patient currently is medically stable to d/c.   Difficult to place patient No  Consultants:  none  Objective: Blood pressure 121/74, pulse 92, temperature 98.5 F (36.9 C), temperature source Oral, resp. rate 20, SpO2 93 %.  Intake/Output Summary (Last 24 hours) at 08/09/2020 0847 Last data filed at 08/09/2020 0800 Gross per 24 hour  Intake --  Output 400 ml  Net -400 ml   There were no vitals filed for this visit.  Examination: General: No acute respiratory distress Lungs: good air movement th/o all fields - no wheezing  Cardiovascular: RRR - no M  Abdomen: NT/ND, soft, bs+, no mass  Extremities: no C/C/E B lower exremities  CBC: Recent Labs  Lab 08/06/20 0439 08/07/20 0414 08/08/20 0451  WBC 3.4* 3.8* 3.9*  NEUTROABS 2.3 2.5 2.2  HGB 13.1 12.8* 12.1*  HCT 39.8 37.8* 36.3*  MCV 93.4 91.5 92.1  PLT 130* 162 481   Basic Metabolic Panel: Recent Labs  Lab 08/05/20 0359 08/06/20 0439 08/07/20 0414 08/08/20 0451  NA 140 141 141 144  K 4.4 5.0 4.3 4.7  CL 101 101 101 106  CO2 30 30 29 30   GLUCOSE 118* 130* 146* 146*  BUN 13 14 16 15   CREATININE 0.69 0.69 0.75 0.74  CALCIUM 8.1* 8.8* 8.4* 8.8*  MG 2.0  --   --   --   PHOS 3.5  --   --   --    GFR: CrCl cannot be calculated (Unknown ideal weight.).  Liver Function Tests: Recent Labs  Lab 08/05/20 0359 08/06/20 0439 08/07/20 0414 08/08/20 0451  AST 22 25 28 24   ALT 7 7 10 9   ALKPHOS 55 54 56 51  BILITOT 0.6 0.6 0.6 0.5  PROT 6.0* 6.1* 6.2* 6.0*  ALBUMIN 2.6* 2.7* 2.6* 2.7*    Recent Labs  Lab 08/04/20 1730  AMMONIA 28    HbA1C: Hgb A1c MFr Bld  Date/Time Value Ref Range Status  08/05/2020 03:59 AM 6.0 (H) 4.8 - 5.6 % Final    Comment:    (NOTE) Pre diabetes:          5.7%-6.4%  Diabetes:              >6.4%  Glycemic control for    <7.0% adults with diabetes   02/20/2020 04:10 PM 6.6 (H) 4.8 - 5.6 % Final    Comment:    (NOTE) Pre diabetes:          5.7%-6.4%  Diabetes:              >6.4%  Glycemic control for   <7.0% adults with diabetes     CBG: Recent Labs  Lab 08/08/20 1200 08/08/20 1600 08/08/20 2039 08/08/20 2159 08/09/20 0756  GLUCAP 111* 166* 241* 203* 122*    Recent Results (from the past 240 hour(s))  Culture, blood (routine x 2)     Status: None   Collection Time: 08/04/20  5:30 PM   Specimen: BLOOD  Result Value Ref Range Status   Specimen Description BLOOD BLOOD LEFT FOREARM  Final   Special Requests   Final    BOTTLES DRAWN AEROBIC AND ANAEROBIC Blood Culture results may not be optimal due to an inadequate volume of blood received in culture bottles   Culture   Final    NO GROWTH 5 DAYS Performed at [facility], [address] [place], [place] [address]    Report Status 08/09/2020 FINAL  Final  SARS Coronavirus 2 by RT PCR (hospital order, performed in [place] hospital lab) Nasopharyngeal Nasopharyngeal Swab     Status: Abnormal   Collection Time: 08/04/20  5:56 PM   Specimen: Nasopharyngeal Swab  Result Value Ref Range Status   SARS Coronavirus 2 POSITIVE (A) NEGATIVE Final    Comment: RESULT CALLED TO, READ BACK BY AND VERIFIED WITH: ROBIN REGISTER,RN 1942 08/04/20 DB (NOTE) SARS-CoV-2 target nucleic acids are DETECTED  SARS-CoV-2 RNA is generally detectable in upper respiratory specimens  during the acute phase of infection.  Positive results are indicative  of the presence of the identified virus, but do not rule out bacterial infection or co-infection with other pathogens not detected by the test.  Clinical correlation with patient history and  other diagnostic information is necessary to determine patient infection status.  The expected result is negative.  Fact Sheet for Patients:   StrictlyIdeas.no   Fact Sheet for  Healthcare Providers:   BankingDealers.co.za    This test is not yet approved or cleared by the [place] FDA and  has been authorized for detection and/or diagnosis of SARS-CoV-2 by FDA under an Emergency Use Authorization (EUA).  This EUA will remain in effect (meaning this tes t can be used) for the duration of  the COVID-19 declaration  under Section 564(b)(1) of the Act, 21 U.S.C. section 360-bbb-3(b)(1), unless the authorization is terminated or revoked sooner.  Performed at [facility], [address] [place], [place] [address]   Culture, blood (routine x 2)     Status: None   Collection Time: 08/04/20  5:57 PM   Specimen: BLOOD  Result Value Ref Range Status   Specimen Description BLOOD RIGHT ANTECUBITAL  Final   Special Requests   Final    BOTTLES DRAWN AEROBIC AND ANAEROBIC Blood Culture results may not be optimal due to an inadequate volume of blood received in culture bottles   Culture   Final    NO GROWTH 5 DAYS Performed at [facility], [address], [place], [place] [address]    Report Status 08/09/2020 FINAL  Final     Scheduled Meds: . vitamin C  500 mg Oral Daily  . aspirin EC  81 mg Oral Daily  . clonazePAM  1 mg Oral QID  . divalproex  1,500 mg Oral QHS  . enoxaparin (LOVENOX) injection  40 mg Subcutaneous Q24H  . gabapentin  100 mg Oral TID  . insulin aspart  0-5 Units Subcutaneous QHS  . insulin aspart  0-9 Units Subcutaneous TID WC  . lamoTRIgine  150 mg Oral QHS  . linagliptin  5 mg Oral Daily  . oxybutynin  10 mg Oral BID  . pantoprazole  40 mg Oral Daily  . predniSONE  40 mg Oral Q breakfast  . QUEtiapine  100 mg Oral TID  . QUEtiapine  400 mg Oral QHS  . simethicone  80 mg Oral TID with meals  . zinc sulfate  220 mg Oral Daily      LOS: 4 days   Cherene Altes, MD [facility] Office  [phone] Pager - Text Page per Amion  If 7PM-7AM, please contact night-coverage per  Amion 08/09/2020, 8:47 AM

## 2020-08-10 DIAGNOSIS — U071 COVID-19: Secondary | ICD-10-CM | POA: Diagnosis not present

## 2020-08-10 DIAGNOSIS — J1282 Pneumonia due to coronavirus disease 2019: Secondary | ICD-10-CM | POA: Diagnosis not present

## 2020-08-10 LAB — GLUCOSE, CAPILLARY
Glucose-Capillary: 155 mg/dL — ABNORMAL HIGH (ref 70–99)
Glucose-Capillary: 252 mg/dL — ABNORMAL HIGH (ref 70–99)
Glucose-Capillary: 155 mg/dL — ABNORMAL HIGH (ref 70–99)
Glucose-Capillary: 189 mg/dL — ABNORMAL HIGH (ref 70–99)

## 2020-08-10 LAB — COMPREHENSIVE METABOLIC PANEL
ALT: 9 U/L (ref 0–44)
AST: 22 U/L (ref 15–41)
Albumin: 2.4 g/dL — ABNORMAL LOW (ref 3.5–5.0)
Alkaline Phosphatase: 54 U/L (ref 38–126)
Anion gap: 8 (ref 5–15)
BUN: 17 mg/dL (ref 8–23)
CO2: 26 mmol/L (ref 22–32)
Calcium: 8.7 mg/dL — ABNORMAL LOW (ref 8.9–10.3)
Chloride: 107 mmol/L (ref 98–111)
Creatinine, Ser: 0.75 mg/dL (ref 0.61–1.24)
GFR, Estimated: >60 mL/min (ref >60)
Glucose, Bld: 144 mg/dL — ABNORMAL HIGH (ref 70–99)
Potassium: 3.6 mmol/L (ref 3.5–5.1)
Sodium: 141 mmol/L (ref 135–145)
Total Bilirubin: 0.7 mg/dL (ref 0.3–1.2)
Total Protein: 5.7 g/dL — ABNORMAL LOW (ref 6.5–8.1)

## 2020-08-10 MED ORDER — QUETIAPINE FUMARATE 300 MG PO TABS
300.0000 mg | ORAL_TABLET | Freq: Every day | ORAL | Status: DC
  Administered 2020-08-10 – 2020-08-27 (×19): 300 mg via ORAL
  Filled 2020-08-10 (×20): qty 1

## 2020-08-10 MED ORDER — CLONAZEPAM 1 MG PO TABS
1.0000 mg | ORAL_TABLET | Freq: Two times a day (BID) | ORAL | Status: DC
  Administered 2020-08-10 – 2020-09-09 (×61): 1 mg via ORAL
  Filled 2020-08-10 (×60): qty 1

## 2020-08-10 MED ORDER — DIVALPROEX SODIUM ER 500 MG PO TB24
1000.0000 mg | ORAL_TABLET | Freq: Every day | ORAL | Status: DC
  Administered 2020-08-10 – 2020-09-08 (×30): 1000 mg via ORAL
  Filled 2020-08-10 (×33): qty 2

## 2020-08-10 MED ORDER — PREDNISONE 20 MG PO TABS
20.0000 mg | ORAL_TABLET | Freq: Every day | ORAL | Status: AC
  Administered 2020-08-11 – 2020-08-13 (×3): 20 mg via ORAL
  Filled 2020-08-10 (×3): qty 1

## 2020-08-10 NOTE — Progress Notes (Signed)
Physical Therapy Treatment Patient Details Name: Alexander Beard MRN: [id number] DOB: 04-Oct-1955 Today's Date: 08/10/2020    History of Present Illness 65 y.o. male with medical history significant for schizoaffective disorder, bipolar type, autism spectrum disorder, COPD, T2DM, HTN, HLD who presents to the ED for evaluation of shortness of breath. Covid-19+ on 08/04/20.     Per brother, patient recently had a prolonged behavioral health hospital stay from Aug - Dec 2021 before discharged to group care home in [place] which is a new environment for him.  Brother states that he has noted significant "grogginess" over the last few weeks since medication schedule changes (4x/day to 2x/day).  This has coincided with several falls that have not resulted in any significant injuries.    PT Comments    Presents with increased confusion compared to initial evaluation.  Limited/no recall of information from previous session; no recognition of therapist.   Does demonstrate progressive increase in gait distance; however, fluctuating gait pattern-very short, shuffling steps mixed with steppage gait pattern throughout distance.  consistent cuing for postural extension and walker position; mod/max assist for walker management.  Significant difficulty with motor planning and overall body control during gait efforts. Unsafe to attempt without RW and +1 at all times.    Follow Up Recommendations  SNF     Equipment Recommendations  Rolling walker with 5" wheels    Recommendations for Other Services       Precautions / Restrictions Precautions Precautions: Fall Restrictions Weight Bearing Restrictions: No    Mobility  Bed Mobility Overal bed mobility: Needs Assistance Bed Mobility: Supine to Sit     Supine to sit: Mod assist     General bed mobility comments: assist for truncal elevation from bed surfacec  Transfers Overall transfer level: Needs assistance Equipment used: Rolling walker (2  wheeled) Transfers: Sit to/from Stand Sit to Stand: Min assist         General transfer comment: pulls on RW despite cuing; difficuty coordinating movement of UEs at times.  Exaggerated postural extension with abdominal flaring, posterior weight shift/lean at times  Ambulation/Gait Ambulation/Gait assistance: Min assist;Mod assist Gait Distance (Feet): 100 Feet Assistive device: Rolling walker (2 wheeled)       General Gait Details: fluctuating gait pattern-very short, shuffling steps mixed with steppage gait pattern throughout distance.  consistent cuing for postural extension and walker position; mod/max assist for walker management.  Significant difficulty with motor planning and overall body control during gait efforts   Stairs             Wheelchair Mobility    Modified Rankin (Stroke Patients Only)       Balance Overall balance assessment: Needs assistance Sitting-balance support: No upper extremity supported;Feet supported Sitting balance-Leahy Scale: Poor Sitting balance - Comments: limited ability to recover balance with any external perturbation or weight shift outside immediate BOS; max assist to prevent fall at times.  However, does maintain static sitting with close sup.   Standing balance support: Bilateral upper extremity supported Standing balance-Leahy Scale: Poor Standing balance comment: Significant UE reliance on RW                            Cognition Arousal/Alertness: Awake/alert Behavior During Therapy: Impulsive Overall Cognitive Status: No family/caregiver present to determine baseline cognitive functioning  General Comments: Oriented to self only; follows simple commands.  Generally impulsive with limited insight into deficits, safety needs.  Limited/no recall of information from previous sessions      Exercises Other Exercises Other Exercises: Toilet transfer, SPT with RW,  min/mod assist for sit/stand, transfer and overall standing balance    General Comments        Pertinent Vitals/Pain Pain Assessment: No/denies pain    Home Living                      Prior Function            PT Goals (current goals can now be found in the care plan section) Acute Rehab PT Goals PT Goal Formulation: Patient unable to participate in goal setting Time For Goal Achievement: 08/21/20 Potential to Achieve Goals: Fair Progress towards PT goals: Progressing toward goals    Frequency    Min 2X/week      PT Plan Current plan remains appropriate    Co-evaluation              AM-PAC PT "6 Clicks" Mobility   Outcome Measure  Help needed turning from your back to your side while in a flat bed without using bedrails?: None Help needed moving from lying on your back to sitting on the side of a flat bed without using bedrails?: A Lot Help needed moving to and from a bed to a chair (including a wheelchair)?: A Little Help needed standing up from a chair using your arms (e.g., wheelchair or bedside chair)?: A Little Help needed to walk in hospital room?: A Little Help needed climbing 3-5 steps with a railing? : A Lot 6 Click Score: 17    End of Session Equipment Utilized During Treatment: Gait belt Activity Tolerance: Patient tolerated treatment well Patient left: in chair;with call bell/phone within reach;with chair alarm set Nurse Communication: Mobility status PT Visit Diagnosis: Muscle weakness (generalized) (M62.81);Difficulty in walking, not elsewhere classified (R26.2)     Time: 7741-2878 PT Time Calculation (min) (ACUTE ONLY): 32 min  Charges:  $Gait Training: 8-22 mins $Therapeutic Activity: 8-22 mins                    Xzaviar Maloof H. Owens Shark, PT, DPT, NCS 08/10/20, 3:26 PM [phone]

## 2020-08-10 NOTE — Progress Notes (Signed)
Alexander Beard  HYQ:[id number] DOB: [birth year] DOA: 08/04/2020 PCP: Remi Haggard, FNP    Brief Narrative:  [phone] with a history of schizoaffective disorder/bipolar type, autism, COPD, DM2, HTN, and HLD who presented to the ED with severe shortness of breath. On arrival he was found to have an oxygen saturation of 90% on room air. In the ED he was found to be CoViD positive and a CXR noted infiltrates.  Significant Events:  1/25 admit via ED  Date of Positive COVID Test:  08/04/20  Vaccination Status: Vaccinated x2, no booster  COVID-19 specific Treatment: Remdesivir 1/25 > 1/29 Steroid 1/25 >  Antimicrobials:  None  DVT prophylaxis: Lovenox  Subjective: Afebrile.  Vital signs stable.  Oxygen saturations 94% on room air.  He seems a bit overly sedated with medication at this time. He is otherwise stable for d/c to a SNF.   Assessment & Plan:  COVID Pneumonia -acute hypoxic respiratory failure Has completed a course of Remdesivir - steroid weaning initiated -respiratory status is now stable - mobilize - ready for SNF placement  Toxic metabolic encephalopathy Due to his acute resp illness - folic acid level normal - B12 normal - has rapidly returned to his baseline - have resumed usual home medications, but will titrate them back a bit today as he seems a bit oversedated   Schizoaffective disorder longtime group home resident - resumed home medications once toxic metabolic encephalopathy cleared   Thrombocytopenia Appears to be a chronic issue, dating back at least as far as 2018 - stable  DM 2 CBG well controlled - A1c 6.0  HTN Blood pressure well controlled   Code Status: FULL CODE Family Communication:  Status is: Inpatient   The patient will require care spanning > 2 midnights and should be moved to inpatient because: Unsafe d/c plan and Inpatient level of care appropriate due to severity of illness  Dispo: The patient is from: Group home               Anticipated d/c is to: SNF              Anticipated d/c date is: 1 day              Patient currently is medically stable to d/c.   Difficult to place patient No  Consultants:  none  Objective: Blood pressure 130/83, pulse 68, temperature 97.6 F (36.4 C), temperature source Oral, resp. rate 16, SpO2 94 %.  Intake/Output Summary (Last 24 hours) at 08/10/2020 0816 Last data filed at 08/09/2020 2100 Gross per 24 hour  Intake --  Output 800 ml  Net -800 ml   There were no vitals filed for this visit.  Examination: General: No acute respiratory distress Lungs: good air movement th/o all fields B  Cardiovascular: RRR - no M  Abdomen: NT/ND, soft, bs+, no mass  Extremities: no edema B LE   CBC: Recent Labs  Lab 08/06/20 0439 08/07/20 0414 08/08/20 0451  WBC 3.4* 3.8* 3.9*  NEUTROABS 2.3 2.5 2.2  HGB 13.1 12.8* 12.1*  HCT 39.8 37.8* 36.3*  MCV 93.4 91.5 92.1  PLT 130* 162 413   Basic Metabolic Panel: Recent Labs  Lab 08/05/20 0359 08/06/20 0439 08/07/20 0414 08/08/20 0451 08/10/20 0502  NA 140   < > 141 144 141  K 4.4   < > 4.3 4.7 3.6  CL 101   < > 101 106 107  CO2 30   < > 29 30 26  GLUCOSE 118*   < > 146* 146* 144*  BUN 13   < > 16 15 17   CREATININE 0.69   < > 0.75 0.74 0.75  CALCIUM 8.1*   < > 8.4* 8.8* 8.7*  MG 2.0  --   --   --   --   PHOS 3.5  --   --   --   --    < > = values in this interval not displayed.   GFR: CrCl cannot be calculated (Unknown ideal weight.).  Liver Function Tests: Recent Labs  Lab 08/06/20 0439 08/07/20 0414 08/08/20 0451 08/10/20 0502  AST 25 28 24 22   ALT 7 10 9 9   ALKPHOS 54 56 51 54  BILITOT 0.6 0.6 0.5 0.7  PROT 6.1* 6.2* 6.0* 5.7*  ALBUMIN 2.7* 2.6* 2.7* 2.4*    Recent Labs  Lab 08/04/20 1730  AMMONIA 28    HbA1C: Hgb A1c MFr Bld  Date/Time Value Ref Range Status  08/05/2020 03:59 AM 6.0 (H) 4.8 - 5.6 % Final    Comment:    (NOTE) Pre diabetes:          5.7%-6.4%  Diabetes:               >6.4%  Glycemic control for   <7.0% adults with diabetes   02/20/2020 04:10 PM 6.6 (H) 4.8 - 5.6 % Final    Comment:    (NOTE) Pre diabetes:          5.7%-6.4%  Diabetes:              >6.4%  Glycemic control for   <7.0% adults with diabetes     CBG: Recent Labs  Lab 08/08/20 2159 08/09/20 0756 08/09/20 1225 08/09/20 1620 08/09/20 2027  GLUCAP 203* 122* 170* 161* 219*    Recent Results (from the past 240 hour(s))  Culture, blood (routine x 2)     Status: None   Collection Time: 08/04/20  5:30 PM   Specimen: BLOOD  Result Value Ref Range Status   Specimen Description BLOOD BLOOD LEFT FOREARM  Final   Special Requests   Final    BOTTLES DRAWN AEROBIC AND ANAEROBIC Blood Culture results may not be optimal due to an inadequate volume of blood received in culture bottles   Culture   Final    NO GROWTH 5 DAYS Performed at [facility], [address] [place],  [address]    Report Status 08/09/2020 FINAL  Final  SARS Coronavirus 2 by RT PCR (hospital order, performed in [place] hospital lab) Nasopharyngeal Nasopharyngeal Swab     Status: Abnormal   Collection Time: 08/04/20  5:56 PM   Specimen: Nasopharyngeal Swab  Result Value Ref Range Status   SARS Coronavirus 2 POSITIVE (A) NEGATIVE Final    Comment: RESULT CALLED TO, READ BACK BY AND VERIFIED WITH: ROBIN REGISTER,RN 1942 08/04/20 DB (NOTE) SARS-CoV-2 target nucleic acids are DETECTED  SARS-CoV-2 RNA is generally detectable in upper respiratory specimens  during the acute phase of infection.  Positive results are indicative  of the presence of the identified virus, but do not rule out bacterial infection or co-infection with other pathogens not detected by the test.  Clinical correlation with patient history and  other diagnostic information is necessary to determine patient infection status.  The expected result is negative.  Fact Sheet for Patients:    StrictlyIdeas.no   Fact Sheet for Healthcare Providers:   BankingDealers.co.za    This test is  not yet approved or cleared by the [place] and  has been authorized for detection and/or diagnosis of SARS-CoV-2 by FDA under an Emergency Use Authorization (EUA).  This EUA will remain in effect (meaning this tes t can be used) for the duration of  the COVID-19 declaration under Section 564(b)(1) of the Act, 21 U.S.C. section 360-bbb-3(b)(1), unless the authorization is terminated or revoked sooner.  Performed at [facility], [address] [place], [place] [address]   Culture, blood (routine x 2)     Status: None   Collection Time: 08/04/20  5:57 PM   Specimen: BLOOD  Result Value Ref Range Status   Specimen Description BLOOD RIGHT ANTECUBITAL  Final   Special Requests   Final    BOTTLES DRAWN AEROBIC AND ANAEROBIC Blood Culture results may not be optimal due to an inadequate volume of blood received in culture bottles   Culture   Final    NO GROWTH 5 DAYS Performed at [facility], [address], [place], [place] [address]    Report Status 08/09/2020 FINAL  Final     Scheduled Meds: . aspirin EC  81 mg Oral Daily  . clonazePAM  1 mg Oral QID  . divalproex  1,500 mg Oral QHS  . enoxaparin (LOVENOX) injection  40 mg Subcutaneous Q24H  . gabapentin  100 mg Oral TID  . insulin aspart  0-5 Units Subcutaneous QHS  . insulin aspart  0-9 Units Subcutaneous TID WC  . lamoTRIgine  150 mg Oral QHS  . linagliptin  5 mg Oral Daily  . oxybutynin  10 mg Oral BID  . pantoprazole  40 mg Oral Daily  . predniSONE  40 mg Oral Q breakfast  . QUEtiapine  100 mg Oral TID  . QUEtiapine  400 mg Oral QHS  . simethicone  80 mg Oral TID with meals      LOS: 5 days   Cherene Altes, MD [facility] Office  [phone] Pager - Text Page per Amion  If 7PM-7AM, please contact night-coverage per  Amion 08/10/2020, 8:16 AM

## 2020-08-10 NOTE — Progress Notes (Signed)
   08/09/20 0600 08/09/20 0639  Adult Fall Risk Assessment  Patient Fall Risk Level High fall risk  --   Adult Fall Risk Interventions  Required Bundle Interventions *See Row Information* High fall risk - low, moderate, and high requirements implemented  --   Additional Interventions Room near nurses station;Use of appropriate toileting equipment (bedpan, BSC, etc.)  --   Screening for Fall Injury Risk (To be completed on HIGH fall risk patients) - Assessing Need for Low Bed  Risk For Fall Injury- Low Bed Criteria Previous fall this admission  --   Will Implement Low Bed and Floor Mats Yes  --   Vitals  Temp  --  98.3 F (36.8 C)  Temp Source  --  Oral  BP  --  131/68  MAP (mmHg)  --  82  BP Location  --  Left Arm  BP Method  --  Automatic  Patient Position (if appropriate)  --  Lying  Pulse Rate  --  73  Resp  --  16  Oxygen Therapy  SpO2  --  97 %  O2 Device  --  Room Air  Pain Assessment  Pain Scale 0-10  --   Pain Score 0  --   Neurological  Neuro (WDL) X  --   Level of Consciousness Alert  --   Orientation Level Oriented to person  --   Cognition Poor attention/concentration;Impulsive  --   Speech Clear  --   Motor Function/Sensation Assessment Grip  --   R Hand Grip Moderate  --   L Hand Grip Moderate   --   Neuro Symptoms Anxiety  --   Neuro symptoms relieved by Rest  --   Glasgow Coma Scale  Eye Opening 4  --   Best Verbal Response (NON-intubated) 4  --   Modified Verbal Response (INTUBATED) 3  --   Best Motor Response 6  --   Glasgow Coma Scale Score (!) 17  --   Musculoskeletal  Musculoskeletal (WDL) X  --   Generalized Weakness Yes  --   Weight Bearing Restrictions No  --   Integumentary  Integumentary (WDL) X  --   Skin Color Appropriate for ethnicity;Other (Comment)  --   Skin Condition Dry;Flaky  --   Skin Integrity Abrasion;Ecchymosis  --   Abrasion Location Arm;Back;Face  --   Abrasion Location Orientation Right;Left  --   Abrasion Intervention  Other (Comment) (assessed, all previous abrasions)  --   Ecchymosis Location Arm;Face;Leg  --   Ecchymosis Location Orientation Right;Left  --   Ecchymosis Intervention Other (Comment) (assesed, all previous bruises)  --

## 2020-08-10 NOTE — TOC Progression Note (Signed)
Transition of Care [facility]) - Progression Note    Patient Details  Name: Alexander Beard MRN: [id number] Date of Birth: 11-11-1955  Transition of Care [facility]) CM/SW [place], LCSW Phone Number: 08/10/2020, 3:28 PM  Clinical Narrative:   CSW called patient's son. He is agreeable to SNF placement. He does not want patient returning to [facility] or any other group home unless they can provide the level of care he needs. He has hired [employer] with [employer] to assist with finding alternate placement. Patient has only been at [facility] for 2 months. Brother stated he has had a functional decline within the past 2-6 months and has had 3-5 falls since being at [facility].  Expected Discharge Plan: Group Home Barriers to Discharge: Continued Medical Work up  Expected Discharge Plan and Services Expected Discharge Plan: Group Home   Discharge Planning Services: CM Consult   Living arrangements for the past 2 months: Group Home                                       Social Determinants of Health (SDOH) Interventions    Readmission Risk Interventions Readmission Risk Prevention Plan 08/07/2020  Transportation Screening Complete  Medication Review (RN Care Manager) Complete  PCP or Specialist appointment within 3-5 days of discharge Complete  HRI or Home Care Consult Complete  SW Recovery Care/Counseling Consult Complete  Palliative Care Screening Not [place] Complete  Some recent data might be hidden

## 2020-08-10 NOTE — Progress Notes (Signed)
   08/09/20 0600  What Happened  Was fall witnessed? No  Was patient injured? No  Patient found on floor  Found by Staff-comment  Follow Up  MD notified Argie Ramming  Time MD notified [phone]  Family notified Yes - comment  Time family notified [phone]  Additional tests No  Progress note created (see row info) Yes  Adult Fall Risk Assessment  Risk Factor Category (scoring not indicated) Fall has occurred during this admission (document High fall risk)  Patient Fall Risk Level High fall risk  Adult Fall Risk Interventions  Required Bundle Interventions *See Row Information* High fall risk - low, moderate, and high requirements implemented  Screening for Fall Injury Risk (To be completed on HIGH fall risk patients) - Assessing Need for Low Bed  Risk For Fall Injury- Low Bed Criteria Previous fall this admission  Will Implement Low Bed and Floor Mats Yes  Screening for Fall Injury Risk (To be completed on HIGH fall risk patients who do not meet crieteria for Low Bed) - Assessing Need for Floor Mats Only  Risk For Fall Injury- Criteria for Floor Mats Confusion/dementia (+NuDESC, CIWA, TBI, etc.)  Will Implement Floor Mats Yes  Pain Assessment  Pain Scale 0-10  Pain Score 0  Neurological  Neuro (WDL) X  Level of Consciousness Alert  Orientation Level Oriented to person  Cognition Poor attention/concentration;Impulsive  Speech Clear  Motor Function/Sensation Assessment Grip  R Hand Grip Moderate  L Hand Grip Moderate   Neuro Symptoms Anxiety  Neuro symptoms relieved by Rest  Glasgow Coma Scale  Eye Opening 4  Best Verbal Response (NON-intubated) 4  Modified Verbal Response (INTUBATED) 3  Best Motor Response 6  Glasgow Coma Scale Score (!) 17  Musculoskeletal  Musculoskeletal (WDL) X  Generalized Weakness Yes  Integumentary  Integumentary (WDL) X  Skin Color Appropriate for ethnicity;Other (Comment)  Skin Condition Dry;Flaky  Skin Integrity Abrasion;Ecchymosis  Abrasion Location  Arm;Back;Face  Abrasion Location Orientation Right;Left  Abrasion Intervention Other (Comment) (assessed, all previous abrasions)  Ecchymosis Location Arm;Face;Leg  Ecchymosis Location Orientation Right;Left  Ecchymosis Intervention Other (Comment) (assesed, all previous bruises)

## 2020-08-10 NOTE — NC FL2 (Signed)
[place] LEVEL OF CARE SCREENING TOOL     IDENTIFICATION  Patient Name: Alexander Beard Birthdate: Jan 16, 1956 Sex: male Admission Date (Current Location): 08/04/2020  [place] and [place] Number:  Engineering geologist and Address:  [facility], [address] [place], [place] [address]      Provider Number: [id number]  Attending Physician Name and Address:  Cherene Altes, MD  Relative Name and Phone Number:       Current Level of Care: Hospital Recommended Level of Care: [place] Prior Approval Number:    Date Approved/Denied:   PASRR Number: Manual review.  Discharge Plan: SNF    Current Diagnoses: Patient Active Problem List   Diagnosis Date Noted  . Pneumonia due to COVID-19 virus 08/04/2020  . Encephalopathy acute 08/04/2020  . Intellectual disability 03/06/2020  . At risk for elopement 03/06/2020  . Diabetes ([place]) 04/10/2015  . Hypertension 04/10/2015  . Prostate hypertrophy 04/10/2015  . Schizoaffective disorder ([place]) 04/10/2015  . Enlarged prostate 04/10/2015  . Essential (primary) hypertension 04/10/2015  . Type 2 diabetes mellitus ([place]) 04/10/2015  . Controlled type 2 diabetes mellitus without complication ([place]) 44/09/4740  . Schizoaffective disorder, bipolar type ([place]) 12/29/2014  . Active autistic disorder 12/29/2014    Orientation RESPIRATION BLADDER Height & Weight     Self  Normal Continent Weight:   Height:     BEHAVIORAL SYMPTOMS/MOOD NEUROLOGICAL BOWEL NUTRITION STATUS  Other (Comment) (Anxiety)  (None) Continent Diet (Regular)  AMBULATORY STATUS COMMUNICATION OF NEEDS Skin   Limited Assist Verbally Skin abrasions,Bruising                       Personal Care Assistance Level of Assistance  Bathing,Feeding,Dressing Bathing Assistance: Limited assistance Feeding assistance: Limited assistance Dressing Assistance: Limited assistance     Functional Limitations Info   Sight,Hearing,Speech Sight Info: Adequate Hearing Info: Adequate Speech Info: Adequate (Delayed responses)    SPECIAL CARE FACTORS FREQUENCY  PT (By licensed PT),OT (By licensed OT)     PT Frequency: 5 x week OT Frequency: 5 x week            Contractures Contractures Info: Not present    Additional Factors Info  Code Status,Allergies,Psychotropic,Isolation Precautions Code Status Info: Full code Allergies Info: Myacins, Penicillins, Cortizone-10 (Hydrocortisone) Psychotropic Info: Schizoaffective disorder, bipolar type, Autism,   Isolation Precautions Info: COVID - tested positive 1/25.     Current Medications (08/10/2020):  This is the current hospital active medication list Current Facility-Administered Medications  Medication Dose Route Frequency Provider Last Rate Last Admin  . acetaminophen (TYLENOL) tablet 650 mg  650 mg Oral Q6H PRN Zada Finders R, MD      . albuterol (VENTOLIN HFA) 108 (90 Base) MCG/ACT inhaler 1-2 puff  1-2 puff Inhalation Q6H PRN Lenore Cordia, MD      . aspirin EC tablet 81 mg  81 mg Oral Daily Cherene Altes, MD   81 mg at 08/10/20 0943  . clonazePAM (KLONOPIN) tablet 1 mg  1 mg Oral BID Joette Catching T, MD      . divalproex (DEPAKOTE ER) 24 hr tablet 1,000 mg  1,000 mg Oral QHS Joette Catching T, MD      . enoxaparin (LOVENOX) injection 40 mg  40 mg Subcutaneous Q24H Lenore Cordia, MD   40 mg at 08/10/20 0943  . guaiFENesin-dextromethorphan (ROBITUSSIN DM) 100-10 MG/5ML syrup 10 mL  10 mL Oral Q4H PRN Zada Finders  R, MD      . hydrOXYzine (ATARAX/VISTARIL) tablet 10 mg  10 mg Oral TID PRN Cherene Altes, MD      . insulin aspart (novoLOG) injection 0-5 Units  0-5 Units Subcutaneous QHS Cherene Altes, MD   2 Units at 08/09/20 2050  . insulin aspart (novoLOG) injection 0-9 Units  0-9 Units Subcutaneous TID WC Cherene Altes, MD   5 Units at 08/10/20 1345  . lamoTRIgine (LAMICTAL) tablet 150 mg  150 mg Oral QHS Cherene Altes, MD   150 mg at 08/09/20 2033  . linagliptin (TRADJENTA) tablet 5 mg  5 mg Oral Daily Cherene Altes, MD   5 mg at 08/10/20 0944  . LORazepam (ATIVAN) tablet 2 mg  2 mg Oral Q6H PRN Cherene Altes, MD   2 mg at 08/09/20 0931  . ondansetron (ZOFRAN) tablet 4 mg  4 mg Oral Q6H PRN Lenore Cordia, MD       Or  . ondansetron (ZOFRAN) injection 4 mg  4 mg Intravenous Q6H PRN Zada Finders R, MD      . oxybutynin (DITROPAN) tablet 10 mg  10 mg Oral BID Cherene Altes, MD   10 mg at 08/10/20 0944  . pantoprazole (PROTONIX) EC tablet 40 mg  40 mg Oral Daily Cherene Altes, MD   40 mg at 08/10/20 0944  . [START ON 08/11/2020] predniSONE (DELTASONE) tablet 20 mg  20 mg Oral Q breakfast Joette Catching T, MD      . QUEtiapine (SEROQUEL) tablet 100 mg  100 mg Oral TID Cherene Altes, MD   100 mg at 08/10/20 0944  . QUEtiapine (SEROQUEL) tablet 300 mg  300 mg Oral QHS Cherene Altes, MD      . senna-docusate (Senokot-S) tablet 1 tablet  1 tablet Oral QHS PRN Lenore Cordia, MD      . simethicone (MYLICON) chewable tablet 80 mg  80 mg Oral TID with meals Cherene Altes, MD   80 mg at 08/10/20 1345     Discharge Medications: Please see discharge summary for a list of discharge medications.  Relevant Imaging Results:  Relevant Lab Results:   Additional Information SS#: [id number]. Admitted from [facility]. Son has hired a Wellsite geologist through [employer] to work on placement in a highter level of care. COVID positive 1/25.  Candie Chroman, LCSW

## 2020-08-10 NOTE — Progress Notes (Signed)
Occupational Therapy Treatment Patient Details Name: Alexander Beard MRN: [id number] DOB: Dec 26, 1955 Today's Date: 08/10/2020    History of present illness 65 y.o. male with medical history significant for schizoaffective disorder, bipolar type, autism spectrum disorder, COPD, T2DM, HTN, HLD who presents to the ED for evaluation of shortness of breath. Covid-19+ on 08/04/20.     Per brother, patient recently had a prolonged behavioral health hospital stay from Aug - Dec 2021 before discharged to group care home in [place] which is a new environment for him.  Brother states that he has noted significant "grogginess" over the last few weeks since medication schedule changes (4x/day to 2x/day).  This has coincided with several falls that have not resulted in any significant injuries.   OT comments  Pt seen for OT treatment on this date. Upon arrival to room pt awake, seated in recliner, with hips shifted forward. Attempted to engage pt in OT session, however pt perseverative on treatment received while in previous residence; OT attempted to re-orient pt to place/situation however pt unable to be re-oriented, appearing agitated, stating "what kind of life do you think youre creating for me here? This is a prison. I need to be out of here." Pt received education on the role of OT and the importance of therapy while in the hospital, with pt deferring ADLs this date. Pt encouraged to shift hips towards back of chair, with pt agreeable and able to complete with SUPERVISION. Further mobility deferred d/t pt's agitation. Pt continues to benefit from skilled OT services to maximize return to PLOF and minimize risk of future falls, injury, caregiver burden, and readmission. Will continue to follow POC. Discharge recommendation remains appropriate.    Follow Up Recommendations  SNF    Equipment Recommendations  Other (comment) (defer)       Precautions / Restrictions Precautions Precautions:  Fall Restrictions Weight Bearing Restrictions: No       Mobility  Transfers        Lateral/Scoot Transfers: Supervision General transfer comment: Pt encouraged to shift hips towards back of chair, with pt agreeable and able to complete with SUPERVISION. Further mobility deferred d/t pt's agitation        ADL either performed or assessed with clinical judgement   ADL  Pt deferred ADLs this session                                                       Cognition Arousal/Alertness: Awake/alert Behavior During Therapy: Agitated Overall Cognitive Status: History of cognitive impairments - at baseline Area of Impairment: Orientation;Attention;Memory;Following commands;Safety/judgement                 Orientation Level: Disoriented to;Situation;Time Current Attention Level: Focused Memory: Decreased recall of precautions;Decreased short-term memory Following Commands: Follows one step commands inconsistently Safety/Judgement: Decreased awareness of safety;Decreased awareness of deficits     General Comments: Attempted to engage pt in therapy, however pt perseverative on treatment received while in previous residence; OT attempted to re-orient pt to place/situation however pt unable to be re-oriented, appearing agitated, stating "what kind of life do you think youre creating for me here? This is a prison. I need to be out of here." Pt received education on the role of OT and the importance of therapy while in the hospital, with pt deferring ADLs this date  Pertinent Vitals/ Pain       Pain Assessment: No/denies pain         Frequency  Min 2X/week        Progress Toward Goals  OT Goals(current goals can now be found in the care plan section)  Progress towards OT goals: Progressing toward goals  Acute Rehab OT Goals OT Goal Formulation: Patient unable to participate in goal setting Time For Goal Achievement:  08/20/20 Potential to Achieve Goals: Good  Plan Discharge plan remains appropriate       AM-PAC OT "6 Clicks" Daily Activity     Outcome Measure   Help from another person eating meals?: None Help from another person taking care of personal grooming?: A Little Help from another person toileting, which includes using toliet, bedpan, or urinal?: A Lot Help from another person bathing (including washing, rinsing, drying)?: A Lot Help from another person to put on and taking off regular upper body clothing?: A Little Help from another person to put on and taking off regular lower body clothing?: A Lot 6 Click Score: 16    End of Session    OT Visit Diagnosis: Unsteadiness on feet (R26.81);History of falling (Z91.81)   Activity Tolerance Treatment limited secondary to agitation   Patient Left in chair;with call bell/phone within reach;with chair alarm set   Nurse Communication Mobility status;Other (comment) (cognitive status)        Time: 1447-1500 OT Time Calculation (min): 13 min  Charges: OT General Charges $OT Visit: 1 Visit OT Treatments $Therapeutic Activity: 8-22 mins  Fredirick Maudlin, OTR/L [place]

## 2020-08-11 DIAGNOSIS — J1282 Pneumonia due to coronavirus disease 2019: Secondary | ICD-10-CM | POA: Diagnosis not present

## 2020-08-11 DIAGNOSIS — U071 COVID-19: Secondary | ICD-10-CM | POA: Diagnosis not present

## 2020-08-11 LAB — GLUCOSE, CAPILLARY
Glucose-Capillary: 101 mg/dL — ABNORMAL HIGH (ref 70–99)
Glucose-Capillary: 182 mg/dL — ABNORMAL HIGH (ref 70–99)
Glucose-Capillary: 120 mg/dL — ABNORMAL HIGH (ref 70–99)
Glucose-Capillary: 196 mg/dL — ABNORMAL HIGH (ref 70–99)

## 2020-08-11 NOTE — Progress Notes (Signed)
Alexander Beard  MEQ:[id number] DOB: [birth year] DOA: 08/04/2020 PCP: Remi Haggard, FNP    Brief Narrative:  [phone] with a history of schizoaffective disorder/bipolar type, autism, COPD, DM2, HTN, and HLD who presented to the ED with severe shortness of breath. On arrival he was found to have an oxygen saturation of 90% on room air. In the ED he was found to be CoViD positive and a CXR noted infiltrates.  Significant Events:  1/25 admit via ED  Date of Positive COVID Test:  08/04/20  Vaccination Status: Vaccinated x2, no booster  COVID-19 specific Treatment: Remdesivir 1/25 > 1/29 Steroid 1/25 >  Antimicrobials:  None  DVT prophylaxis: Lovenox  Subjective: Family agrees w/ SNF placement.  Afebrile.  Vital signs stable.  Saturations 90-97% on room air. Much more alert today after adjustment in meds yesterday. Denies sob, cp, abodm pain.   Assessment & Plan:  COVID Pneumonia -acute hypoxic respiratory failure Has completed a course of Remdesivir - steroid weaning initiated - respiratory status is now stable - mobilize - ready for SNF placement  Toxic metabolic encephalopathy - somnolence  Due to his acute resp illness - folic acid level normal - B12 normal - has rapidly returned to his baseline - resumed usual home medications, but with this pt became excessively somnolent so have titrated them back - I suspect he was overly medicated at baseline   Schizoaffective disorder longtime group home resident - resumed home medications once toxic metabolic encephalopathy cleared, but doses being adjusted - he needs SNF level of care at this time   Thrombocytopenia Appears to be a chronic issue, dating back at least as far as 2018 - stable  DM 2 CBG well controlled - A1c 6.0  HTN Blood pressure well controlled   Code Status: FULL CODE Family Communication:  Status is: Inpatient   The patient will require care spanning > 2 midnights and should be moved to inpatient because:  Unsafe d/c plan and Inpatient level of care appropriate due to severity of illness  Dispo: The patient is from: Group home              Anticipated d/c is to: SNF              Anticipated d/c date is: 1 day              Patient currently is medically stable to d/c.   Difficult to place patient No  Consultants:  none  Objective: Blood pressure 103/68, pulse 75, temperature 98.7 F (37.1 C), temperature source Oral, resp. rate 17, SpO2 90 %.  Intake/Output Summary (Last 24 hours) at 08/11/2020 0737 Last data filed at 08/11/2020 0604 Gross per 24 hour  Intake --  Output 550 ml  Net -550 ml   There were no vitals filed for this visit.  Examination: General: No acute respiratory distress - alert and conversant  Lungs: good air movement th/o all fields B - no wheezing   Cardiovascular: RRR w/o murmer  Abdomen: NT/ND, soft, bs+, no mass  Extremities: no edema B lower extremities   CBC: Recent Labs  Lab 08/06/20 0439 08/07/20 0414 08/08/20 0451  WBC 3.4* 3.8* 3.9*  NEUTROABS 2.3 2.5 2.2  HGB 13.1 12.8* 12.1*  HCT 39.8 37.8* 36.3*  MCV 93.4 91.5 92.1  PLT 130* 162 892   Basic Metabolic Panel: Recent Labs  Lab 08/05/20 0359 08/06/20 0439 08/07/20 0414 08/08/20 0451 08/10/20 0502  NA 140   < > 141 144  141  K 4.4   < > 4.3 4.7 3.6  CL 101   < > 101 106 107  CO2 30   < > 29 30 26   GLUCOSE 118*   < > 146* 146* 144*  BUN 13   < > 16 15 17   CREATININE 0.69   < > 0.75 0.74 0.75  CALCIUM 8.1*   < > 8.4* 8.8* 8.7*  MG 2.0  --   --   --   --   PHOS 3.5  --   --   --   --    < > = values in this interval not displayed.   GFR: CrCl cannot be calculated (Unknown ideal weight.).  Liver Function Tests: Recent Labs  Lab 08/06/20 0439 08/07/20 0414 08/08/20 0451 08/10/20 0502  AST 25 28 24 22   ALT 7 10 9 9   ALKPHOS 54 56 51 54  BILITOT 0.6 0.6 0.5 0.7  PROT 6.1* 6.2* 6.0* 5.7*  ALBUMIN 2.7* 2.6* 2.7* 2.4*    Recent Labs  Lab 08/04/20 1730  AMMONIA 28     HbA1C: Hgb A1c MFr Bld  Date/Time Value Ref Range Status  08/05/2020 03:59 AM 6.0 (H) 4.8 - 5.6 % Final    Comment:    (NOTE) Pre diabetes:          5.7%-6.4%  Diabetes:              >6.4%  Glycemic control for   <7.0% adults with diabetes   02/20/2020 04:10 PM 6.6 (H) 4.8 - 5.6 % Final    Comment:    (NOTE) Pre diabetes:          5.7%-6.4%  Diabetes:              >6.4%  Glycemic control for   <7.0% adults with diabetes     CBG: Recent Labs  Lab 08/09/20 2027 08/10/20 0933 08/10/20 1336 08/10/20 1612 08/10/20 2108  GLUCAP 219* 155* 252* 155* 189*    Recent Results (from the past 240 hour(s))  Culture, blood (routine x 2)     Status: None   Collection Time: 08/04/20  5:30 PM   Specimen: BLOOD  Result Value Ref Range Status   Specimen Description BLOOD BLOOD LEFT FOREARM  Final   Special Requests   Final    BOTTLES DRAWN AEROBIC AND ANAEROBIC Blood Culture results may not be optimal due to an inadequate volume of blood received in culture bottles   Culture   Final    NO GROWTH 5 DAYS Performed at [facility], [address] [place], [place] [address]    Report Status 08/09/2020 FINAL  Final  SARS Coronavirus 2 by RT PCR (hospital order, performed in [place] hospital lab) Nasopharyngeal Nasopharyngeal Swab     Status: Abnormal   Collection Time: 08/04/20  5:56 PM   Specimen: Nasopharyngeal Swab  Result Value Ref Range Status   SARS Coronavirus 2 POSITIVE (A) NEGATIVE Final    Comment: RESULT CALLED TO, READ BACK BY AND VERIFIED WITH: ROBIN REGISTER,RN 1942 08/04/20 DB (NOTE) SARS-CoV-2 target nucleic acids are DETECTED  SARS-CoV-2 RNA is generally detectable in upper respiratory specimens  during the acute phase of infection.  Positive results are indicative  of the presence of the identified virus, but do not rule out bacterial infection or co-infection with other pathogens not detected by the test.  Clinical correlation with patient  history and  other diagnostic information is necessary to determine patient  infection status.  The expected result is negative.  Fact Sheet for Patients:   StrictlyIdeas.no   Fact Sheet for Healthcare Providers:   BankingDealers.co.za    This test is not yet approved or cleared by the [place] FDA and  has been authorized for detection and/or diagnosis of SARS-CoV-2 by FDA under an Emergency Use Authorization (EUA).  This EUA will remain in effect (meaning this tes t can be used) for the duration of  the COVID-19 declaration under Section 564(b)(1) of the Act, 21 U.S.C. section 360-bbb-3(b)(1), unless the authorization is terminated or revoked sooner.  Performed at [facility], [address] [place], [place] [address]   Culture, blood (routine x 2)     Status: None   Collection Time: 08/04/20  5:57 PM   Specimen: BLOOD  Result Value Ref Range Status   Specimen Description BLOOD RIGHT ANTECUBITAL  Final   Special Requests   Final    BOTTLES DRAWN AEROBIC AND ANAEROBIC Blood Culture results may not be optimal due to an inadequate volume of blood received in culture bottles   Culture   Final    NO GROWTH 5 DAYS Performed at [facility], [address] [place], [place] [address]    Report Status 08/09/2020 FINAL  Final     Scheduled Meds: . aspirin EC  81 mg Oral Daily  . clonazePAM  1 mg Oral BID  . divalproex  1,000 mg Oral QHS  . enoxaparin (LOVENOX) injection  40 mg Subcutaneous Q24H  . insulin aspart  0-5 Units Subcutaneous QHS  . insulin aspart  0-9 Units Subcutaneous TID WC  . lamoTRIgine  150 mg Oral QHS  . linagliptin  5 mg Oral Daily  . oxybutynin  10 mg Oral BID  . pantoprazole  40 mg Oral Daily  . predniSONE  20 mg Oral Q breakfast  . QUEtiapine  100 mg Oral TID  . QUEtiapine  300 mg Oral QHS  . simethicone  80 mg Oral TID with meals      LOS: 6 days   Cherene Altes,  MD [facility] Office  [phone] Pager - Text Page per Amion  If 7PM-7AM, please contact night-coverage per Amion 08/11/2020, 7:37 AM

## 2020-08-11 NOTE — Clinical Social Work Note (Signed)
RE: Alexander Beard Date of Birth: 1956-04-06 Date: 08/11/2020   To Whom It May Concern:  Please be advised that the above-named patient will require a short-term nursing home stay - anticipated 30 days or less for rehabilitation and strengthening.  The plan is for return home.

## 2020-08-12 DIAGNOSIS — R4182 Altered mental status, unspecified: Secondary | ICD-10-CM | POA: Diagnosis not present

## 2020-08-12 DIAGNOSIS — U071 COVID-19: Secondary | ICD-10-CM | POA: Diagnosis not present

## 2020-08-12 DIAGNOSIS — F250 Schizoaffective disorder, bipolar type: Secondary | ICD-10-CM

## 2020-08-12 DIAGNOSIS — E119 Type 2 diabetes mellitus without complications: Secondary | ICD-10-CM | POA: Diagnosis not present

## 2020-08-12 DIAGNOSIS — I10 Essential (primary) hypertension: Secondary | ICD-10-CM | POA: Diagnosis not present

## 2020-08-12 LAB — CBC
HCT: 34.4 % — ABNORMAL LOW (ref 39.0–52.0)
Hemoglobin: 11.7 g/dL — ABNORMAL LOW (ref 13.0–17.0)
MCH: 31 pg (ref 26.0–34.0)
MCHC: 34 g/dL (ref 30.0–36.0)
MCV: 91 fL (ref 80.0–100.0)
Platelets: 266 10*3/uL (ref 150–400)
RBC: 3.78 MIL/uL — ABNORMAL LOW (ref 4.22–5.81)
RDW: 14.4 % (ref 11.5–15.5)
WBC: 5 10*3/uL (ref 4.0–10.5)
nRBC: 0 % (ref 0.0–0.2)

## 2020-08-12 LAB — GLUCOSE, CAPILLARY
Glucose-Capillary: 126 mg/dL — ABNORMAL HIGH (ref 70–99)
Glucose-Capillary: 248 mg/dL — ABNORMAL HIGH (ref 70–99)
Glucose-Capillary: 126 mg/dL — ABNORMAL HIGH (ref 70–99)
Glucose-Capillary: 219 mg/dL — ABNORMAL HIGH (ref 70–99)

## 2020-08-12 NOTE — Progress Notes (Signed)
[facility]  PROGRESS NOTE  Alexander Beard CVE:[id number] DOB: 1955/08/11 DOA: 08/04/2020 PCP: Remi Haggard, FNP   Brief HPI:   65 year old male with history of schizoaffective disorder, bipolar type, autism, COPD, diabetes mellitus type 2, hypertension, hyperlipidemia who presented to ED with severe shortness of breath.  On arrival he was found to have O2 sats of 90% on room air.  He was found to be Covid positive and noted to have infiltrates on chest x-ray.  Vaccination status-vaccinated x2, no booster  Subjective   Patient seen and examined, denies shortness of breath.   Assessment/Plan:     1. Acute hypoxemic respiratory failure-secondary to COVID-19 pneumonia.  He has completed course of remdesivir, on prednisone taper.  Respiratory status is stable.  Patient ready for skilled nursing facility placement. 2. Acute metabolic encephalopathy-due to acute respiratory illness, folic acid normal, W25 normal.  He has rapidly returned to baseline.  Likely he was overmedicated by his home medications, dose has been reduced. 3. Schizoaffective disorder-longtime group home resident.  Resume home medications once encephalopathy clears. 4. Thrombocytopenia-chronic issue, stable. 5. Diabetes mellitus type 2-CBG well controlled, continue sliding scale insulin with NovoLog. 6. Hypertension-blood pressure well controlled.     COVID-19 Labs  No results for input(s): DDIMER, FERRITIN, LDH, CRP in the last 72 hours.  Lab Results  Component Value Date   SARSCOV2NAA POSITIVE (A) 08/04/2020   [place] NEGATIVE 06/10/2020   [place] NEGATIVE 06/03/2020   [place] NEGATIVE 02/19/2020     Scheduled medications:   . aspirin EC  81 mg Oral Daily  . clonazePAM  1 mg Oral BID  . divalproex  1,000 mg Oral QHS  . enoxaparin (LOVENOX) injection  40 mg Subcutaneous Q24H  . insulin aspart  0-5 Units Subcutaneous QHS  . insulin aspart  0-9 Units Subcutaneous TID WC  . lamoTRIgine   150 mg Oral QHS  . linagliptin  5 mg Oral Daily  . oxybutynin  10 mg Oral BID  . pantoprazole  40 mg Oral Daily  . predniSONE  20 mg Oral Q breakfast  . QUEtiapine  100 mg Oral TID  . QUEtiapine  300 mg Oral QHS  . simethicone  80 mg Oral TID with meals         CBG: Recent Labs  Lab 08/11/20 1623 08/11/20 2007 08/12/20 0744 08/12/20 1209 08/12/20 1623  GLUCAP 120* 196* 126* 248* 126*    SpO2: 100 % O2 Flow Rate (L/min): 2 L/min    CBC: Recent Labs  Lab 08/06/20 0439 08/07/20 0414 08/08/20 0451 08/12/20 0507  WBC 3.4* 3.8* 3.9* 5.0  NEUTROABS 2.3 2.5 2.2  --   HGB 13.1 12.8* 12.1* 11.7*  HCT 39.8 37.8* 36.3* 34.4*  MCV 93.4 91.5 92.1 91.0  PLT 130* 162 179 852    Basic Metabolic Panel: Recent Labs  Lab 08/06/20 0439 08/07/20 0414 08/08/20 0451 08/10/20 0502  NA 141 141 144 141  K 5.0 4.3 4.7 3.6  CL 101 101 106 107  CO2 30 29 30 26   GLUCOSE 130* 146* 146* 144*  BUN 14 16 15 17   CREATININE 0.69 0.75 0.74 0.75  CALCIUM 8.8* 8.4* 8.8* 8.7*     Liver Function Tests: Recent Labs  Lab 08/06/20 0439 08/07/20 0414 08/08/20 0451 08/10/20 0502  AST 25 28 24 22   ALT 7 10 9 9   ALKPHOS 54 56 51 54  BILITOT 0.6 0.6 0.5 0.7  PROT 6.1* 6.2* 6.0* 5.7*  ALBUMIN 2.7* 2.6* 2.7* 2.4*  Antibiotics: Anti-infectives (From admission, onward)   Start     Dose/Rate Route Frequency Ordered Stop   08/05/20 1000  remdesivir 100 mg in sodium chloride 0.9 % 100 mL IVPB       "Followed by" Linked Group Details   100 mg 200 mL/hr over 30 Minutes Intravenous Daily 08/04/20 2117 08/08/20 1140   08/04/20 2115  remdesivir 200 mg in sodium chloride 0.9% 250 mL IVPB       "Followed by" Linked Group Details   200 mg 580 mL/hr over 30 Minutes Intravenous Once 08/04/20 2117 08/05/20 0154       DVT prophylaxis: Lovenox  Code Status: Full code  Family Communication: No family at bedside   Consultants:    Procedures:    Objective   Vitals:    08/12/20 0341 08/12/20 0745 08/12/20 1213 08/12/20 1626  BP: 107/74 119/74 100/70 114/69  Pulse: 71 (!) 56 70 64  Resp: 18 18 18 20   Temp: 98.4 F (36.9 C) 97.7 F (36.5 C) 97.6 F (36.4 C) (!) 97.5 F (36.4 C)  TempSrc:  Oral  Oral  SpO2: 93% 95% 99% 100%   No intake or output data in the 24 hours ending 08/12/20 1725  01/31 1901 - 02/02 0700 In: -  Out: 550 [Urine:550]  There were no vitals filed for this visit.  Physical Examination:   General-appears in no acute distress Heart-S1-S2, regular, no murmur auscultated Lungs-clear to auscultation bilaterally, no wheezing or crackles auscultated Abdomen-soft, nontender, no organomegaly Extremities-no edema in the lower extremities Neuro-alert, oriented x3, no focal deficit noted  Status is: Inpatient  Dispo: The patient is from: [place]              Anticipated d/c is to: Skilled nursing facility              Anticipated d/c date is: 08/13/2020              Patient currently medically stable for discharge  Barrier to discharge-awaiting bed at nursing facility      Data Reviewed:   Recent Results (from the past 240 hour(s))  Culture, blood (routine x 2)     Status: None   Collection Time: 08/04/20  5:30 PM   Specimen: BLOOD  Result Value Ref Range Status   Specimen Description BLOOD BLOOD LEFT FOREARM  Final   Special Requests   Final    BOTTLES DRAWN AEROBIC AND ANAEROBIC Blood Culture results may not be optimal due to an inadequate volume of blood received in culture bottles   Culture   Final    NO GROWTH 5 DAYS Performed at [facility], [address] [place], [place] [address]    Report Status 08/09/2020 FINAL  Final  SARS Coronavirus 2 by RT PCR (hospital order, performed in [place] hospital lab) Nasopharyngeal Nasopharyngeal Swab     Status: Abnormal   Collection Time: 08/04/20  5:56 PM   Specimen: Nasopharyngeal Swab  Result Value Ref Range Status   SARS Coronavirus 2 POSITIVE (A)  NEGATIVE Final    Comment: RESULT CALLED TO, READ BACK BY AND VERIFIED WITH: ROBIN REGISTER,RN 1942 08/04/20 DB (NOTE) SARS-CoV-2 target nucleic acids are DETECTED  SARS-CoV-2 RNA is generally detectable in upper respiratory specimens  during the acute phase of infection.  Positive results are indicative  of the presence of the identified virus, but do not rule out bacterial infection or co-infection with other pathogens not detected by the test.  Clinical correlation  with patient history and  other diagnostic information is necessary to determine patient infection status.  The expected result is negative.  Fact Sheet for Patients:   StrictlyIdeas.no   Fact Sheet for Healthcare Providers:   BankingDealers.co.za    This test is not yet approved or cleared by the [place] FDA and  has been authorized for detection and/or diagnosis of SARS-CoV-2 by FDA under an Emergency Use Authorization (EUA).  This EUA will remain in effect (meaning this tes t can be used) for the duration of  the COVID-19 declaration under Section 564(b)(1) of the Act, 21 U.S.C. section 360-bbb-3(b)(1), unless the authorization is terminated or revoked sooner.  Performed at [facility], [address] [place], [place] [address]   Culture, blood (routine x 2)     Status: None   Collection Time: 08/04/20  5:57 PM   Specimen: BLOOD  Result Value Ref Range Status   Specimen Description BLOOD RIGHT ANTECUBITAL  Final   Special Requests   Final    BOTTLES DRAWN AEROBIC AND ANAEROBIC Blood Culture results may not be optimal due to an inadequate volume of blood received in culture bottles   Culture   Final    NO GROWTH 5 DAYS Performed at [facility], [address], [place], [place] [address]    Report Status 08/09/2020 FINAL  Final         Oswald Hillock   [facility] If 7PM-7AM, please contact night-coverage at  [email or web address], Office  [phone]   08/12/2020, 5:25 PM  LOS: 7 days

## 2020-08-12 NOTE — Progress Notes (Signed)
Occupational Therapy Treatment Patient Details Name: Alexander Beard MRN: [id number] DOB: July 16, 1955 Today's Date: 08/12/2020    History of present illness 65 y.o. male with medical history significant for schizoaffective disorder, bipolar type, autism spectrum disorder, COPD, T2DM, HTN, HLD who presents to the ED for evaluation of shortness of breath. Covid-19+ on 08/04/20.     Per brother, patient recently had a prolonged behavioral health hospital stay from Aug - Dec 2021 before discharged to group care home in [place] which is a new environment for him.  Brother states that he has noted significant "grogginess" over the last few weeks since medication schedule changes (4x/day to 2x/day).  This has coincided with several falls that have not resulted in any significant injuries.   OT comments  Pt seen for OT treatment on this date. Upon arrival to room pt awake, seated upright in bed. Pt with improved cognition this session, A&Ox3, agreeable to OOB mobility, and able to follow 1-step commands more consistently. Pt required MIN GUARD for bed mobility, MIN A for sit<> stand transfer, and MOD A for taking forward/backwards step with RW; pt requires multimodal cues to manage RW during functional mobility. Pt was able to bend and reach beyond BOS to doff/don socks while seated in chair; one forward LOB observed however pt able to regain sitting balance with CGA. Pt making good progress toward goals. Pt continues to benefit from skilled OT services to maximize return to PLOF and minimize risk of future falls, injury, caregiver burden, and readmission. Will continue to follow POC. Discharge recommendation remains appropriate.    Follow Up Recommendations  SNF    Equipment Recommendations  Other (comment) (defer)       Precautions / Restrictions Precautions Precautions: Fall Restrictions Weight Bearing Restrictions: No       Mobility Bed Mobility Overal bed mobility: Needs Assistance Bed  Mobility: Supine to Sit     Supine to sit: Min guard;HOB elevated     General bed mobility comments: Increased time/effort  Transfers Overall transfer level: Needs assistance Equipment used: Rolling walker (2 wheeled) Transfers: Sit to/from Stand Sit to Stand: Min assist              Balance Overall balance assessment: Needs assistance Sitting-balance support: No upper extremity supported;Feet supported   Sitting balance - Comments: Pt bending and reaching beyond BOS to doff/don socks while seated in chair; one forward LOB observed however pt able to regain sitting balance with CGA   Standing balance support: Bilateral upper extremity supported;During functional activity Standing balance-Leahy Scale: Poor Standing balance comment: Significant UE reliance on RW, requires cues for RW management                           ADL either performed or assessed with clinical judgement   ADL Overall ADL's : Needs assistance/impaired Eating/Feeding: Set up;Sitting Eating/Feeding Details (indicate cue type and reason): Set-up A for opening food packaging                 Lower Body Dressing: Minimal assistance;Cueing for safety;Cueing for compensatory techniques;Sitting/lateral leans Lower Body Dressing Details (indicate cue type and reason): Pt bending and reaching beyond BOS to doff/don socks while seated in chair; one forward LOB observed however pt able to regain sitting balance with CGA             Functional mobility during ADLs: Moderate assistance;Rolling walker;Cueing for safety;Cueing for sequencing General ADL Comments: Pt able to  take ~10 steps forward and ~10 steps backwards with RW, requiring MOD A for sequecing steps with RW               Cognition Arousal/Alertness: Awake/alert Behavior During Therapy: WFL for tasks assessed/performed Overall Cognitive Status: History of cognitive impairments - at baseline                      Current Attention Level: Focused Memory: Decreased recall of precautions;Decreased short-term memory Following Commands: Follows one step commands consistently Safety/Judgement: Decreased awareness of safety;Decreased awareness of deficits     General Comments: Pt with improved cognition this session, A&Ox3, agreeable to OOB mobility, and able to follow 1-step commands consistently                   Pertinent Vitals/ Pain       Pain Assessment: No/denies pain         Frequency  Min 2X/week        Progress Toward Goals  OT Goals(current goals can now be found in the care plan section)  Progress towards OT goals: Progressing toward goals  Acute Rehab OT Goals OT Goal Formulation: With patient Time For Goal Achievement: 08/20/20 Potential to Achieve Goals: Good  Plan Discharge plan remains appropriate;Frequency remains appropriate    Co-evaluation                 AM-PAC OT "6 Clicks" Daily Activity     Outcome Measure   Help from another person eating meals?: None Help from another person taking care of personal grooming?: A Little Help from another person toileting, which includes using toliet, bedpan, or urinal?: A Lot Help from another person bathing (including washing, rinsing, drying)?: A Lot Help from another person to put on and taking off regular upper body clothing?: A Little Help from another person to put on and taking off regular lower body clothing?: A Lot 6 Click Score: 16    End of Session Equipment Utilized During Treatment: Gait belt;Rolling walker  OT Visit Diagnosis: Unsteadiness on feet (R26.81);History of falling (Z91.81)   Activity Tolerance Patient tolerated treatment well   Patient Left in chair;with call bell/phone within reach;with chair alarm set   Nurse Communication Mobility status        Time: 1425-1500 OT Time Calculation (min): 35 min  Charges: OT General Charges $OT Visit: 1 Visit OT Treatments $Self  Care/Home Management : 8-22 mins $Therapeutic Activity: 8-22 mins  Fredirick Maudlin, OTR/L [place]

## 2020-08-12 NOTE — TOC Progression Note (Addendum)
Transition of Care [facility]) - Progression Note    Patient Details  Name: Alexander Beard MRN: [id number] Date of Birth: 05/16/1956  Transition of Care [facility] CM/SW [place], LCSW Phone Number: 08/12/2020, 9:47 AM  Clinical Narrative:  Uploaded requested documents into [facility] for PASARR review.   9:59 am: PASARR under level 2 review. No bed offers yet. Extending search.  1:01 pm: Spoke to patient's community case manager regarding disposition planning.  2:40 pm: Called and spoke with brother. Discussed conversation with community case Freight forwarder. CSW looking into memory care units in [place], [place], and [place] counties. Faxed referrals to [place] at [facility], [facility] in [place], [facility] in [place], and [facility]. All of these ALF's said they had bed availability on their dementia units. Left a message at [facility]. [facility] and [facility] do not have beds. [facility] said they have a 9-12 month wait list.  Expected Discharge Plan: Group Home Barriers to Discharge: Continued Medical Work up  Expected Discharge Plan and Services Expected Discharge Plan: Group Home   Discharge Planning Services: CM Consult   Living arrangements for the past 2 months: Group Home                                       Social Determinants of Health (SDOH) Interventions    Readmission Risk Interventions Readmission Risk Prevention Plan 08/07/2020  Transportation Screening Complete  Medication Review Press photographer) Complete  PCP or Specialist appointment within 3-5 days of discharge Complete  HRI or Home Care Consult Complete  SW Recovery Care/Counseling Consult Complete  Palliative Care Screening Not [place] Complete  Some recent data might be hidden

## 2020-08-13 DIAGNOSIS — U071 COVID-19: Secondary | ICD-10-CM | POA: Diagnosis not present

## 2020-08-13 DIAGNOSIS — I10 Essential (primary) hypertension: Secondary | ICD-10-CM | POA: Diagnosis not present

## 2020-08-13 DIAGNOSIS — R4182 Altered mental status, unspecified: Secondary | ICD-10-CM | POA: Diagnosis not present

## 2020-08-13 DIAGNOSIS — E119 Type 2 diabetes mellitus without complications: Secondary | ICD-10-CM | POA: Diagnosis not present

## 2020-08-13 LAB — GLUCOSE, CAPILLARY
Glucose-Capillary: 107 mg/dL — ABNORMAL HIGH (ref 70–99)
Glucose-Capillary: 150 mg/dL — ABNORMAL HIGH (ref 70–99)
Glucose-Capillary: 230 mg/dL — ABNORMAL HIGH (ref 70–99)
Glucose-Capillary: 172 mg/dL — ABNORMAL HIGH (ref 70–99)

## 2020-08-13 NOTE — Progress Notes (Signed)
[facility]  PROGRESS NOTE  ERICKSON KOCIAN [id number] DOB: 1955-08-15 DOA: 08/04/2020 PCP: Remi Haggard, FNP   Brief HPI:   65 year old male with history of schizoaffective disorder, bipolar type, autism, COPD, diabetes mellitus type 2, hypertension, hyperlipidemia who presented to ED with severe shortness of breath.  On arrival he was found to have O2 sats of 90% on room air.  He was found to be Covid positive and noted to have infiltrates on chest x-ray.  Vaccination status-vaccinated x2, no booster  Subjective   Patient seen and examined, denies any complaints.  Eating breakfast.   Assessment/Plan:     1. Acute hypoxemic respiratory failure-secondary to COVID-19 pneumonia.  He has completed course of remdesivir, on prednisone taper.  Respiratory status is stable.  Patient ready for skilled nursing facility placement. 2. Acute metabolic encephalopathy-due to acute respiratory illness, folic acid normal, [id number] normal.  He has rapidly returned to baseline.  Likely he was overmedicated by his home medications, dose has been reduced.  Awaiting bed at skilled nursing facility. 3. Schizoaffective disorder-longtime group home resident.  Resume home medications once encephalopathy clears. 4. Thrombocytopenia-chronic issue, stable. 5. Diabetes mellitus type 2-CBG well controlled, continue sliding scale insulin with NovoLog. 6. Hypertension-blood pressure well controlled.     COVID-19 Labs  No results for input(s): DDIMER, FERRITIN, LDH, CRP in the last 72 hours.  Lab Results  Component Value Date   SARSCOV2NAA POSITIVE (A) 08/04/2020   [place] NEGATIVE 06/10/2020   [place] NEGATIVE 06/03/2020   [place] NEGATIVE 02/19/2020     Scheduled medications:   . aspirin EC  81 mg Oral Daily  . clonazePAM  1 mg Oral BID  . divalproex  1,000 mg Oral QHS  . enoxaparin (LOVENOX) injection  40 mg Subcutaneous Q24H  . insulin aspart  0-5 Units Subcutaneous QHS  .  insulin aspart  0-9 Units Subcutaneous TID WC  . lamoTRIgine  150 mg Oral QHS  . linagliptin  5 mg Oral Daily  . oxybutynin  10 mg Oral BID  . pantoprazole  40 mg Oral Daily  . QUEtiapine  100 mg Oral TID  . QUEtiapine  300 mg Oral QHS  . simethicone  80 mg Oral TID with meals         CBG: Recent Labs  Lab 08/12/20 0744 08/12/20 1209 08/12/20 1623 08/12/20 2121 08/13/20 0800  GLUCAP 126* 248* 126* 219* 107*    SpO2: 91 % O2 Flow Rate (L/min): 2 L/min    CBC: Recent Labs  Lab 08/07/20 0414 08/08/20 0451 08/12/20 0507  WBC 3.8* 3.9* 5.0  NEUTROABS 2.5 2.2  --   HGB 12.8* 12.1* 11.7*  HCT 37.8* 36.3* 34.4*  MCV 91.5 92.1 91.0  PLT 162 179 123456    Basic Metabolic Panel: Recent Labs  Lab 08/07/20 0414 08/08/20 0451 08/10/20 0502  NA 141 144 141  K 4.3 4.7 3.6  CL 101 106 107  CO2 29 30 26   GLUCOSE 146* 146* 144*  BUN 16 15 17   CREATININE 0.75 0.74 0.75  CALCIUM 8.4* 8.8* 8.7*     Liver Function Tests: Recent Labs  Lab 08/07/20 0414 08/08/20 0451 08/10/20 0502  AST 28 24 22   ALT 10 9 9   ALKPHOS 56 51 54  BILITOT 0.6 0.5 0.7  PROT 6.2* 6.0* 5.7*  ALBUMIN 2.6* 2.7* 2.4*     Antibiotics: Anti-infectives (From admission, onward)   Start     Dose/Rate Route Frequency Ordered Stop   08/05/20 1000  remdesivir 100 mg in sodium chloride 0.9 % 100 mL IVPB       "Followed by" Linked Group Details   100 mg 200 mL/hr over 30 Minutes Intravenous Daily 08/04/20 2117 08/08/20 1140   08/04/20 2115  remdesivir 200 mg in sodium chloride 0.9% 250 mL IVPB       "Followed by" Linked Group Details   200 mg 580 mL/hr over 30 Minutes Intravenous Once 08/04/20 2117 08/05/20 0154       DVT prophylaxis: Lovenox  Code Status: Full code  Family Communication: No family at bedside   Consultants:    Procedures:    Objective   Vitals:   08/12/20 1931 08/12/20 2357 08/13/20 0349 08/13/20 0800  BP: 126/85 135/75 (!) 141/76 123/73  Pulse: 81 62 (!)  56 (!) 57  Resp: 20 18 18 17   Temp: 98.3 F (36.8 C) 97.8 F (36.6 C) 97.8 F (36.6 C) 97.8 F (36.6 C)  TempSrc: Oral     SpO2: 94% 93% 91% 91%    Intake/Output Summary (Last 24 hours) at 08/13/2020 1135 Last data filed at 08/12/2020 2100 Gross per 24 hour  Intake --  Output 200 ml  Net -200 ml    02/01 1901 - 02/03 0700 In: -  Out: 200 [Urine:200]  There were no vitals filed for this visit.  Physical Examination:   General-appears in no acute distress Heart-S1-S2, regular, no murmur auscultated Lungs-clear to auscultation bilaterally, no wheezing or crackles auscultated Abdomen-soft, nontender, no organomegaly Extremities-no edema in the lower extremities Neuro-alert, oriented x3, no focal deficit noted  Status is: Inpatient  Dispo: The patient is from: [place]              Anticipated d/c is to: Skilled nursing facility              Anticipated d/c date is: 08/14/2020              Patient currently medically stable for discharge  Barrier to discharge-awaiting bed at nursing facility.      Data Reviewed:   Recent Results (from the past 240 hour(s))  Culture, blood (routine x 2)     Status: None   Collection Time: 08/04/20  5:30 PM   Specimen: BLOOD  Result Value Ref Range Status   Specimen Description BLOOD BLOOD LEFT FOREARM  Final   Special Requests   Final    BOTTLES DRAWN AEROBIC AND ANAEROBIC Blood Culture results may not be optimal due to an inadequate volume of blood received in culture bottles   Culture   Final    NO GROWTH 5 DAYS Performed at [facility], [address] [place], [place] [address]    Report Status 08/09/2020 FINAL  Final  SARS Coronavirus 2 by RT PCR (hospital order, performed in [place] hospital lab) Nasopharyngeal Nasopharyngeal Swab     Status: Abnormal   Collection Time: 08/04/20  5:56 PM   Specimen: Nasopharyngeal Swab  Result Value Ref Range Status   SARS Coronavirus 2 POSITIVE (A) NEGATIVE Final     Comment: RESULT CALLED TO, READ BACK BY AND VERIFIED WITH: ROBIN REGISTER,RN 1942 08/04/20 DB (NOTE) SARS-CoV-2 target nucleic acids are DETECTED  SARS-CoV-2 RNA is generally detectable in upper respiratory specimens  during the acute phase of infection.  Positive results are indicative  of the presence of the identified virus, but do not rule out bacterial infection or co-infection with other pathogens not detected by the test.  Clinical correlation with  patient history and  other diagnostic information is necessary to determine patient infection status.  The expected result is negative.  Fact Sheet for Patients:   StrictlyIdeas.no   Fact Sheet for Healthcare Providers:   BankingDealers.co.za    This test is not yet approved or cleared by the [place] FDA and  has been authorized for detection and/or diagnosis of SARS-CoV-2 by FDA under an Emergency Use Authorization (EUA).  This EUA will remain in effect (meaning this tes t can be used) for the duration of  the COVID-19 declaration under Section 564(b)(1) of the Act, 21 U.S.C. section 360-bbb-3(b)(1), unless the authorization is terminated or revoked sooner.  Performed at [facility], [address] [place], [place] [address]   Culture, blood (routine x 2)     Status: None   Collection Time: 08/04/20  5:57 PM   Specimen: BLOOD  Result Value Ref Range Status   Specimen Description BLOOD RIGHT ANTECUBITAL  Final   Special Requests   Final    BOTTLES DRAWN AEROBIC AND ANAEROBIC Blood Culture results may not be optimal due to an inadequate volume of blood received in culture bottles   Culture   Final    NO GROWTH 5 DAYS Performed at [facility], [address] [place], [place] [address]    Report Status 08/09/2020 FINAL  Final         Oswald Hillock   [facility] If 7PM-7AM, please contact night-coverage at [email or web address], Office   [phone]   08/13/2020, 11:35 AM  LOS: 8 days

## 2020-08-13 NOTE — Progress Notes (Signed)
Physical Therapy Treatment Patient Details Name: Alexander Beard MRN: [id number] DOB: [birth year] Today's Date: 08/13/2020    History of Present Illness 65 y.o. male with medical history significant for schizoaffective disorder, bipolar type, autism spectrum disorder, COPD, T2DM, HTN, HLD who presents to the ED for evaluation of shortness of breath. Covid-19+ on 08/04/20.     Per brother, patient recently had a prolonged behavioral health hospital stay from Aug - Dec 2021 before discharged to group care home in [place] which is a new environment for him.  Brother states that he has noted significant "grogginess" over the last few weeks since medication schedule changes (4x/day to 2x/day).  This has coincided with several falls that have not resulted in any significant injuries.    PT Comments    Pt was long sitting in bed looking at coloring book upon arriving. On rm air throughout with vitals stable. Bed soaked from incontinence, assisted with hygiene care prior to OOB activity.  He is alert and oriented x 2. Very pleasant and motivated however cognition does greatly influence session/safety.Some ataxic movements noted throughout session. He was able to exit R side of bed with increased time + use of bed rail + vcs throughout for sequencing. Sat EOB with close SBA. Stood form elevated bed height with CGA, from standard height with min assist + max vcs. Pt has poor insight of deficits and poor safety awareness. Ambulated with RW to door with min assist + max vcs however pt has progressively more flexed knees/posture and required max assist to make it back to recliner safely. Constant vcs for improved gait posture and overall gait safety. He was repositioned in recliner at conclusion of session, with chair alarm in place, breakfast tray set up,  And RN staff aware of his abilities.    Follow Up Recommendations  SNF;Supervision/Assistance - 24 hour;Supervision for mobility/OOB     Equipment  Recommendations  Rolling walker with 5" wheels (may need w/c if unable to DC to SNF)    Recommendations for Other Services       Precautions / Restrictions Precautions Precautions: Fall    Mobility  Bed Mobility Overal bed mobility: Needs Assistance Bed Mobility: Supine to Sit     Supine to sit: Supervision (HOB elevated only ~15degrees)     General bed mobility comments: Pt was able to progress from supine to short sit EOB with max vcs and increased time. He was able to perform without any physical assistance. did require use of bed rail  Transfers Overall transfer level: Needs assistance Equipment used: Rolling walker (2 wheeled) Transfers: Sit to/from Stand Sit to Stand: From elevated surface;Min guard;Min assist         General transfer comment: Pt performed STS from EOB with CGA from elevated bed height and Min assist from standard height  Ambulation/Gait Ambulation/Gait assistance: Min assist;Max assist Gait Distance (Feet): 30 Feet Assistive device: Rolling walker (2 wheeled) Gait Pattern/deviations: Trunk flexed;Staggering left;Staggering right;Steppage Gait velocity: decreased   General Gait Details: Pt has very unusual gait kinematics. He has progressively flexed knees and required Min assist at first however required max assist once fatigued. Was able to ambulate to doorway of rioom and back to recliner. Recommend +2 for gait in future for safety       Balance Overall balance assessment: Needs assistance Sitting-balance support: No upper extremity supported;Feet supported Sitting balance-Leahy Scale: Good Sitting balance - Comments: no LOB sitting EOB x ~ 3 minutes   Standing balance support: Bilateral upper  extremity supported;During functional activity Standing balance-Leahy Scale: Poor Standing balance comment: Significant UE reliance on RW, requires cues for RW management         Cognition Arousal/Alertness: Awake/alert Behavior During Therapy:  [facility] for tasks assessed/performed Overall Cognitive Status: History of cognitive impairments - at baseline Area of Impairment: Orientation;Attention;Memory;Following commands;Safety/judgement    Orientation Level: Disoriented to;Time;Situation Current Attention Level: Focused Memory: Decreased recall of precautions;Decreased short-term memory Following Commands: Follows one step commands consistently Safety/Judgement: Decreased awareness of safety;Decreased awareness of deficits     General Comments: Pt is A throughout session and very pleasant. Pt however is slightly disoriented and has lack of insight of deficits. WWas able to consistently follow commands throughout             Pertinent Vitals/Pain Pain Assessment: No/denies pain           PT Goals (current goals can now be found in the care plan section) Acute Rehab PT Goals Patient Stated Goal: none stated Progress towards PT goals: Not progressing toward goals - comment (cognition limiting)    Frequency    Min 2X/week      PT Plan Current plan remains appropriate       AM-PAC PT "6 Clicks" Mobility   Outcome Measure  Help needed turning from your back to your side while in a flat bed without using bedrails?: None Help needed moving from lying on your back to sitting on the side of a flat bed without using bedrails?: A Little Help needed moving to and from a bed to a chair (including a wheelchair)?: A Lot Help needed standing up from a chair using your arms (e.g., wheelchair or bedside chair)?: A Lot Help needed to walk in hospital room?: A Lot Help needed climbing 3-5 steps with a railing? : A Lot 6 Click Score: 15    End of Session Equipment Utilized During Treatment: Gait belt Activity Tolerance: Patient tolerated treatment well;Patient limited by fatigue Patient left: in chair;with call bell/phone within reach;with chair alarm set Nurse Communication: Mobility status PT Visit Diagnosis: Muscle weakness  (generalized) (M62.81);Difficulty in walking, not elsewhere classified (R26.2)     Time: 0160-1093 PT Time Calculation (min) (ACUTE ONLY): 20 min  Charges:  $Therapeutic Activity: 8-22 mins                     Julaine Fusi PTA 08/13/20, 9:31 AM

## 2020-08-14 DIAGNOSIS — E119 Type 2 diabetes mellitus without complications: Secondary | ICD-10-CM | POA: Diagnosis not present

## 2020-08-14 DIAGNOSIS — R4182 Altered mental status, unspecified: Secondary | ICD-10-CM | POA: Diagnosis not present

## 2020-08-14 DIAGNOSIS — I10 Essential (primary) hypertension: Secondary | ICD-10-CM | POA: Diagnosis not present

## 2020-08-14 DIAGNOSIS — U071 COVID-19: Secondary | ICD-10-CM | POA: Diagnosis not present

## 2020-08-14 LAB — BASIC METABOLIC PANEL
Anion gap: 8 (ref 5–15)
BUN: 18 mg/dL (ref 8–23)
CO2: 27 mmol/L (ref 22–32)
Calcium: 8.8 mg/dL — ABNORMAL LOW (ref 8.9–10.3)
Chloride: 105 mmol/L (ref 98–111)
Creatinine, Ser: 0.74 mg/dL (ref 0.61–1.24)
GFR, Estimated: >60 mL/min (ref >60)
Glucose, Bld: 188 mg/dL — ABNORMAL HIGH (ref 70–99)
Potassium: 3 mmol/L — ABNORMAL LOW (ref 3.5–5.1)
Sodium: 140 mmol/L (ref 135–145)

## 2020-08-14 LAB — CBC
HCT: 36.8 % — ABNORMAL LOW (ref 39.0–52.0)
Hemoglobin: 12.1 g/dL — ABNORMAL LOW (ref 13.0–17.0)
MCH: 30.5 pg (ref 26.0–34.0)
MCHC: 32.9 g/dL (ref 30.0–36.0)
MCV: 92.7 fL (ref 80.0–100.0)
Platelets: 261 10*3/uL (ref 150–400)
RBC: 3.97 MIL/uL — ABNORMAL LOW (ref 4.22–5.81)
RDW: 13.9 % (ref 11.5–15.5)
WBC: 6.7 10*3/uL (ref 4.0–10.5)
nRBC: 0 % (ref 0.0–0.2)

## 2020-08-14 LAB — GLUCOSE, CAPILLARY
Glucose-Capillary: 133 mg/dL — ABNORMAL HIGH (ref 70–99)
Glucose-Capillary: 123 mg/dL — ABNORMAL HIGH (ref 70–99)
Glucose-Capillary: 116 mg/dL — ABNORMAL HIGH (ref 70–99)
Glucose-Capillary: 186 mg/dL — ABNORMAL HIGH (ref 70–99)

## 2020-08-14 MED ORDER — SERTRALINE HCL 50 MG PO TABS
50.0000 mg | ORAL_TABLET | Freq: Every day | ORAL | Status: DC
  Administered 2020-08-14 – 2020-08-28 (×15): 50 mg via ORAL
  Filled 2020-08-14 (×15): qty 1

## 2020-08-14 MED ORDER — POTASSIUM CHLORIDE CRYS ER 20 MEQ PO TBCR
40.0000 meq | EXTENDED_RELEASE_TABLET | ORAL | Status: AC
  Administered 2020-08-14 (×2): 40 meq via ORAL
  Filled 2020-08-14 (×2): qty 2

## 2020-08-14 NOTE — Progress Notes (Signed)
Occupational Therapy Treatment Patient Details Name: Alexander Beard MRN: [id number] DOB: April 23, 1956 Today's Date: 08/14/2020    History of present illness 65 y.o. male with medical history significant for schizoaffective disorder, bipolar type, autism spectrum disorder, COPD, T2DM, HTN, HLD who presents to the ED for evaluation of shortness of breath. Covid-19+ on 08/04/20.     Per brother, patient recently had a prolonged behavioral health hospital stay from Aug - Dec 2021 before discharged to group care home in [place] which is a new environment for him.  Brother states that he has noted significant "grogginess" over the last few weeks since medication schedule changes (4x/day to 2x/day).  This has coincided with several falls that have not resulted in any significant injuries.   OT comments  Pt seen for OT tx this date. Pt finished eating breakfast. Set up for remaining tray items provided and set aside for pt later. Pt engaged in seated table top activity to promote meaningful occupational engagement in art, per pt request. Pt participated in seated grooming tasks with set up and cue to initiate. Politely declined OOB activity. Environmental set up required to maximize optimal positioning/comfort and access to materials. OT facilitated planning for future shower with staff (pt declined during session). Pt continues to benefit from skilled OT services.    Follow Up Recommendations  SNF    Equipment Recommendations  Other (comment) (defer to next venue)    Recommendations for Other Services      Precautions / Restrictions Precautions Precautions: Fall Restrictions Weight Bearing Restrictions: No       Mobility Bed Mobility                  Transfers                      Balance                                           ADL either performed or assessed with clinical judgement   ADL Overall ADL's : Needs assistance/impaired     Grooming:  Sitting;Wash/dry hands;Wash/dry face;Set up                                       Vision       Perception     Praxis      Cognition Arousal/Alertness: Awake/alert Behavior During Therapy: [facility] for tasks assessed/performed Overall Cognitive Status: History of cognitive impairments - at baseline                                 General Comments: Oriented to self, follows commands with occasional cues, pleasant        Exercises Other Exercises Other Exercises: seated grooming and table top activity   Shoulder Instructions       General Comments      Pertinent Vitals/ Pain       Pain Assessment: No/denies pain  Home Living                                          Prior Functioning/Environment  Frequency  Min 2X/week        Progress Toward Goals  OT Goals(current goals can now be found in the care plan section)  Progress towards OT goals: Progressing toward goals  Acute Rehab OT Goals Patient Stated Goal: none stated OT Goal Formulation: With patient Time For Goal Achievement: 08/20/20 Potential to Achieve Goals: Good  Plan Discharge plan remains appropriate;Frequency remains appropriate    Co-evaluation                 AM-PAC OT "6 Clicks" Daily Activity     Outcome Measure   Help from another person eating meals?: None Help from another person taking care of personal grooming?: A Little Help from another person toileting, which includes using toliet, bedpan, or urinal?: A Lot Help from another person bathing (including washing, rinsing, drying)?: A Lot Help from another person to put on and taking off regular upper body clothing?: A Little Help from another person to put on and taking off regular lower body clothing?: A Lot 6 Click Score: 16    End of Session    OT Visit Diagnosis: Unsteadiness on feet (R26.81);History of falling (Z91.81)   Activity Tolerance Patient  tolerated treatment well   Patient Left in bed;with call bell/phone within reach;with bed alarm set   Nurse Communication Other (comment) (request for bath later today)        Time: 4259-5638 OT Time Calculation (min): 24 min  Charges: OT General Charges $OT Visit: 1 Visit OT Treatments $Self Care/Home Management : 8-22 mins $Therapeutic Activity: 8-22 mins  Jeni Salles, MPH, MS, OTR/L ascom [phone] 08/14/20, 10:50 AM

## 2020-08-14 NOTE — Progress Notes (Signed)
[facility]  PROGRESS NOTE  Alexander Beard [id number] DOB: 1956/01/31 DOA: 08/04/2020 PCP: Remi Haggard, FNP   Brief HPI:   65 year old male with history of schizoaffective disorder, bipolar type, autism, COPD, diabetes mellitus type 2, hypertension, hyperlipidemia who presented to ED with severe shortness of breath.  On arrival he was found to have O2 sats of 90% on room air.  He was found to be Covid positive and noted to have infiltrates on chest x-ray.  Vaccination status-vaccinated x2, no booster  Subjective   Patient seen and examined, no new complaints.  Awaiting bed at skilled nursing facility   Assessment/Plan:     1. Acute hypoxemic respiratory failure-secondary to COVID-19 pneumonia.  He has completed course of remdesivir, on prednisone taper.  Respiratory status is stable.  Patient ready for skilled nursing facility placement. 2. Acute metabolic encephalopathy-due to acute respiratory illness, folic acid normal, [id number] normal.  He has rapidly returned to baseline.  Likely he was overmedicated by his home medications, dose has been reduced.  Awaiting bed at skilled nursing facility. 3. Schizoaffective disorder-longtime group home resident.  Continue Seroquel, clonazepam, Lamictal.  Will restart Zoloft at low-dose of 50 mg daily. 4. Hypokalemia-potassium is 3.0 today.  Will start K. Dur 40 mg p.o. every 4 hours x 3 doses.  Follow BMP in am. 5. Thrombocytopenia-chronic issue, stable. 6. Diabetes mellitus type 2-CBG well controlled, continue sliding scale insulin with NovoLog. Continue linagliptin.   7. Hypertension-blood pressure well controlled.     COVID-19 Labs  No results for input(s): DDIMER, FERRITIN, LDH, CRP in the last 72 hours.  Lab Results  Component Value Date   SARSCOV2NAA POSITIVE (A) 08/04/2020   [place] NEGATIVE 06/10/2020   [place] NEGATIVE 06/03/2020   [place] NEGATIVE 02/19/2020     Scheduled medications:   . aspirin EC   81 mg Oral Daily  . clonazePAM  1 mg Oral BID  . divalproex  1,000 mg Oral QHS  . enoxaparin (LOVENOX) injection  40 mg Subcutaneous Q24H  . insulin aspart  0-5 Units Subcutaneous QHS  . insulin aspart  0-9 Units Subcutaneous TID WC  . lamoTRIgine  150 mg Oral QHS  . linagliptin  5 mg Oral Daily  . oxybutynin  10 mg Oral BID  . pantoprazole  40 mg Oral Daily  . potassium chloride  40 mEq Oral Q4H  . QUEtiapine  100 mg Oral TID  . QUEtiapine  300 mg Oral QHS  . simethicone  80 mg Oral TID with meals         CBG: Recent Labs  Lab 08/13/20 1222 08/13/20 1540 08/13/20 2126 08/14/20 0746 08/14/20 1205  GLUCAP 150* 230* 172* 133* 123*    SpO2: 100 % O2 Flow Rate (L/min): 2 L/min    CBC: Recent Labs  Lab 08/08/20 0451 08/12/20 0507 08/14/20 0445  WBC 3.9* 5.0 6.7  NEUTROABS 2.2  --   --   HGB 12.1* 11.7* 12.1*  HCT 36.3* 34.4* 36.8*  MCV 92.1 91.0 92.7  PLT 179 266 0000000    Basic Metabolic Panel: Recent Labs  Lab 08/08/20 0451 08/10/20 0502 08/14/20 0445  NA 144 141 140  K 4.7 3.6 3.0*  CL 106 107 105  CO2 30 26 27   GLUCOSE 146* 144* 188*  BUN 15 17 18   CREATININE 0.74 0.75 0.74  CALCIUM 8.8* 8.7* 8.8*     Liver Function Tests: Recent Labs  Lab 08/08/20 0451 08/10/20 0502  AST 24 22  ALT 9  9  ALKPHOS 51 54  BILITOT 0.5 0.7  PROT 6.0* 5.7*  ALBUMIN 2.7* 2.4*     Antibiotics: Anti-infectives (From admission, onward)   Start     Dose/Rate Route Frequency Ordered Stop   08/05/20 1000  remdesivir 100 mg in sodium chloride 0.9 % 100 mL IVPB       "Followed by" Linked Group Details   100 mg 200 mL/hr over 30 Minutes Intravenous Daily 08/04/20 2117 08/08/20 1140   08/04/20 2115  remdesivir 200 mg in sodium chloride 0.9% 250 mL IVPB       "Followed by" Linked Group Details   200 mg 580 mL/hr over 30 Minutes Intravenous Once 08/04/20 2117 08/05/20 0154       DVT prophylaxis: Lovenox  Code Status: Full code  Family Communication: No  family at bedside   Consultants:    Procedures:    Objective   Vitals:   08/13/20 2001 08/14/20 0020 08/14/20 0421 08/14/20 0747  BP: 131/79 (!) 142/86 126/81 105/73  Pulse: (!) 101 80 90 72  Resp: 20 18 18 16   Temp: 97.8 F (36.6 C) 97.8 F (36.6 C) 98 F (36.7 C) (!) 97.5 F (36.4 C)  TempSrc:      SpO2: 94% 93% 94% 100%   No intake or output data in the 24 hours ending 08/14/20 1516  02/02 1901 - 02/04 0700 In: -  Out: 200 [Urine:200]  There were no vitals filed for this visit.  Physical Examination:   General-appears in no acute distress Heart-S1-S2, regular, no murmur auscultated Lungs-clear to auscultation bilaterally, no wheezing or crackles auscultated Abdomen-soft, nontender, no organomegaly Extremities-no edema in the lower extremities Neuro-alert, oriented x3, no focal deficit noted  Status is: Inpatient  Dispo: The patient is from: [place]              Anticipated d/c is to: Skilled nursing facility              Anticipated d/c date is: 08/14/2020              Patient currently medically stable for discharge  Barrier to discharge-awaiting bed at nursing facility.      Data Reviewed:   Recent Results (from the past 240 hour(s))  Culture, blood (routine x 2)     Status: None   Collection Time: 08/04/20  5:30 PM   Specimen: BLOOD  Result Value Ref Range Status   Specimen Description BLOOD BLOOD LEFT FOREARM  Final   Special Requests   Final    BOTTLES DRAWN AEROBIC AND ANAEROBIC Blood Culture results may not be optimal due to an inadequate volume of blood received in culture bottles   Culture   Final    NO GROWTH 5 DAYS Performed at [facility], [address] [name], [place] [address]    Report Status 08/09/2020 FINAL  Final  SARS Coronavirus 2 by RT PCR (hospital order, performed in [place] hospital lab) Nasopharyngeal Nasopharyngeal Swab     Status: Abnormal   Collection Time: 08/04/20  5:56 PM   Specimen:  Nasopharyngeal Swab  Result Value Ref Range Status   SARS Coronavirus 2 POSITIVE (A) NEGATIVE Final    Comment: RESULT CALLED TO, READ BACK BY AND VERIFIED WITH: ROBIN REGISTER,RN 1942 08/04/20 DB (NOTE) SARS-CoV-2 target nucleic acids are DETECTED  SARS-CoV-2 RNA is generally detectable in upper respiratory specimens  during the acute phase of infection.  Positive results are indicative  of the presence of the  identified virus, but do not rule out bacterial infection or co-infection with other pathogens not detected by the test.  Clinical correlation with patient history and  other diagnostic information is necessary to determine patient infection status.  The expected result is negative.  Fact Sheet for Patients:   StrictlyIdeas.no   Fact Sheet for Healthcare Providers:   BankingDealers.co.za    This test is not yet approved or cleared by the [place] FDA and  has been authorized for detection and/or diagnosis of SARS-CoV-2 by FDA under an Emergency Use Authorization (EUA).  This EUA will remain in effect (meaning this tes t can be used) for the duration of  the COVID-19 declaration under Section 564(b)(1) of the Act, 21 U.S.C. section 360-bbb-3(b)(1), unless the authorization is terminated or revoked sooner.  Performed at [facility], [address] [place], [place] [address]   Culture, blood (routine x 2)     Status: None   Collection Time: 08/04/20  5:57 PM   Specimen: BLOOD  Result Value Ref Range Status   Specimen Description BLOOD RIGHT ANTECUBITAL  Final   Special Requests   Final    BOTTLES DRAWN AEROBIC AND ANAEROBIC Blood Culture results may not be optimal due to an inadequate volume of blood received in culture bottles   Culture   Final    NO GROWTH 5 DAYS Performed at [facility], [address], [place], [place] [address]    Report Status 08/09/2020 FINAL  Final         Oswald Hillock   [facility] If 7PM-7AM, please contact night-coverage at [email or web address], Office  [phone]   08/14/2020, 3:16 PM  LOS: 9 days            [facility]  PROGRESS NOTE  Alexander Beard [facility]:[id number] DOB: 1955/11/01 DOA: 08/04/2020 PCP: Remi Haggard, FNP   Brief HPI:   65 year old male with history of schizoaffective disorder, bipolar type, autism, COPD, diabetes mellitus type 2, hypertension, hyperlipidemia who presented to ED with severe shortness of breath.  On arrival he was found to have O2 sats of 90% on room air.  He was found to be Covid positive and noted to have infiltrates on chest x-ray.  Vaccination status-vaccinated x2, no booster  Subjective   Patient seen and examined, denies any complaints.  Eating breakfast.   Assessment/Plan:     8. Acute hypoxemic respiratory failure-secondary to COVID-19 pneumonia.  He has completed course of remdesivir, on prednisone taper.  Respiratory status is stable.  Patient ready for skilled nursing facility placement. 9. Acute metabolic encephalopathy-due to acute respiratory illness, folic acid normal, M62 normal.  He has rapidly returned to baseline.  Likely he was overmedicated by his home medications, dose has been reduced.  Awaiting bed at skilled nursing facility. 10. Schizoaffective disorder-longtime group home resident.  Resume home medications once encephalopathy clears. 11. Thrombocytopenia-chronic issue, stable. 12. Diabetes mellitus type 2-CBG well controlled, continue sliding scale insulin with NovoLog. 13. Hypertension-blood pressure well controlled.     COVID-19 Labs  No results for input(s): DDIMER, FERRITIN, LDH, CRP in the last 72 hours.  Lab Results  Component Value Date   SARSCOV2NAA POSITIVE (A) 08/04/2020   [place] NEGATIVE 06/10/2020   [place] NEGATIVE 06/03/2020   [place] NEGATIVE 02/19/2020     Scheduled medications:   . aspirin EC  81 mg Oral Daily  .  clonazePAM  1 mg Oral BID  . divalproex  1,000 mg Oral QHS  .  enoxaparin (LOVENOX) injection  40 mg Subcutaneous Q24H  . insulin aspart  0-5 Units Subcutaneous QHS  . insulin aspart  0-9 Units Subcutaneous TID WC  . lamoTRIgine  150 mg Oral QHS  . linagliptin  5 mg Oral Daily  . oxybutynin  10 mg Oral BID  . pantoprazole  40 mg Oral Daily  . potassium chloride  40 mEq Oral Q4H  . QUEtiapine  100 mg Oral TID  . QUEtiapine  300 mg Oral QHS  . simethicone  80 mg Oral TID with meals         CBG: Recent Labs  Lab 08/13/20 1222 08/13/20 1540 08/13/20 2126 08/14/20 0746 08/14/20 1205  GLUCAP 150* 230* 172* 133* 123*    SpO2: 100 % O2 Flow Rate (L/min): 2 L/min    CBC: Recent Labs  Lab 08/08/20 0451 08/12/20 0507 08/14/20 0445  WBC 3.9* 5.0 6.7  NEUTROABS 2.2  --   --   HGB 12.1* 11.7* 12.1*  HCT 36.3* 34.4* 36.8*  MCV 92.1 91.0 92.7  PLT 179 266 0000000    Basic Metabolic Panel: Recent Labs  Lab 08/08/20 0451 08/10/20 0502 08/14/20 0445  NA 144 141 140  K 4.7 3.6 3.0*  CL 106 107 105  CO2 30 26 27   GLUCOSE 146* 144* 188*  BUN 15 17 18   CREATININE 0.74 0.75 0.74  CALCIUM 8.8* 8.7* 8.8*     Liver Function Tests: Recent Labs  Lab 08/08/20 0451 08/10/20 0502  AST 24 22  ALT 9 9  ALKPHOS 51 54  BILITOT 0.5 0.7  PROT 6.0* 5.7*  ALBUMIN 2.7* 2.4*     Antibiotics: Anti-infectives (From admission, onward)   Start     Dose/Rate Route Frequency Ordered Stop   08/05/20 1000  remdesivir 100 mg in sodium chloride 0.9 % 100 mL IVPB       "Followed by" Linked Group Details   100 mg 200 mL/hr over 30 Minutes Intravenous Daily 08/04/20 2117 08/08/20 1140   08/04/20 2115  remdesivir 200 mg in sodium chloride 0.9% 250 mL IVPB       "Followed by" Linked Group Details   200 mg 580 mL/hr over 30 Minutes Intravenous Once 08/04/20 2117 08/05/20 0154       DVT prophylaxis: Lovenox  Code Status: Full code  Family Communication: No family at  bedside   Consultants:    Procedures:    Objective   Vitals:   08/13/20 2001 08/14/20 0020 08/14/20 0421 08/14/20 0747  BP: 131/79 (!) 142/86 126/81 105/73  Pulse: (!) 101 80 90 72  Resp: 20 18 18 16   Temp: 97.8 F (36.6 C) 97.8 F (36.6 C) 98 F (36.7 C) (!) 97.5 F (36.4 C)  TempSrc:      SpO2: 94% 93% 94% 100%   No intake or output data in the 24 hours ending 08/14/20 1517  02/02 1901 - 02/04 0700 In: -  Out: 200 [Urine:200]  There were no vitals filed for this visit.  Physical Examination:   General-appears in no acute distress Heart-S1-S2, regular, no murmur auscultated Lungs-clear to auscultation bilaterally, no wheezing or crackles auscultated Abdomen-soft, nontender, no organomegaly Extremities-no edema in the lower extremities Neuro-alert, oriented x3, no focal deficit noted  Status is: Inpatient  Dispo: The patient is from: [place]              Anticipated d/c is to: Skilled nursing facility  Anticipated d/c date is: 08/14/2020              Patient currently medically stable for discharge  Barrier to discharge-awaiting bed at nursing facility.      Data Reviewed:   Recent Results (from the past 240 hour(s))  Culture, blood (routine x 2)     Status: None   Collection Time: 08/04/20  5:30 PM   Specimen: BLOOD  Result Value Ref Range Status   Specimen Description BLOOD BLOOD LEFT FOREARM  Final   Special Requests   Final    BOTTLES DRAWN AEROBIC AND ANAEROBIC Blood Culture results may not be optimal due to an inadequate volume of blood received in culture bottles   Culture   Final    NO GROWTH 5 DAYS Performed at [facility], [address] [place], [place] [address]    Report Status 08/09/2020 FINAL  Final  SARS Coronavirus 2 by RT PCR (hospital order, performed in [place] hospital lab) Nasopharyngeal Nasopharyngeal Swab     Status: Abnormal   Collection Time: 08/04/20  5:56 PM   Specimen: Nasopharyngeal  Swab  Result Value Ref Range Status   SARS Coronavirus 2 POSITIVE (A) NEGATIVE Final    Comment: RESULT CALLED TO, READ BACK BY AND VERIFIED WITH: ROBIN REGISTER,RN 1942 08/04/20 DB (NOTE) SARS-CoV-2 target nucleic acids are DETECTED  SARS-CoV-2 RNA is generally detectable in upper respiratory specimens  during the acute phase of infection.  Positive results are indicative  of the presence of the identified virus, but do not rule out bacterial infection or co-infection with other pathogens not detected by the test.  Clinical correlation with patient history and  other diagnostic information is necessary to determine patient infection status.  The expected result is negative.  Fact Sheet for Patients:   StrictlyIdeas.no   Fact Sheet for Healthcare Providers:   BankingDealers.co.za    This test is not yet approved or cleared by the [place] FDA and  has been authorized for detection and/or diagnosis of SARS-CoV-2 by FDA under an Emergency Use Authorization (EUA).  This EUA will remain in effect (meaning this tes t can be used) for the duration of  the COVID-19 declaration under Section 564(b)(1) of the Act, 21 U.S.C. section 360-bbb-3(b)(1), unless the authorization is terminated or revoked sooner.  Performed at [facility], [address] [place], [place] [address]   Culture, blood (routine x 2)     Status: None   Collection Time: 08/04/20  5:57 PM   Specimen: BLOOD  Result Value Ref Range Status   Specimen Description BLOOD RIGHT ANTECUBITAL  Final   Special Requests   Final    BOTTLES DRAWN AEROBIC AND ANAEROBIC Blood Culture results may not be optimal due to an inadequate volume of blood received in culture bottles   Culture   Final    NO GROWTH 5 DAYS Performed at [facility], [address], [place], [place] [address]    Report Status 08/09/2020 FINAL  Final         Oswald Hillock   [facility] If 7PM-7AM, please contact night-coverage at [email or web address], Office  [phone]   08/14/2020, 3:17 PM  LOS: 9 days

## 2020-08-15 DIAGNOSIS — J1282 Pneumonia due to coronavirus disease 2019: Secondary | ICD-10-CM | POA: Diagnosis not present

## 2020-08-15 DIAGNOSIS — R4182 Altered mental status, unspecified: Secondary | ICD-10-CM | POA: Diagnosis not present

## 2020-08-15 DIAGNOSIS — U071 COVID-19: Secondary | ICD-10-CM | POA: Diagnosis not present

## 2020-08-15 DIAGNOSIS — F250 Schizoaffective disorder, bipolar type: Secondary | ICD-10-CM | POA: Diagnosis not present

## 2020-08-15 LAB — BASIC METABOLIC PANEL
Anion gap: 9 (ref 5–15)
BUN: 14 mg/dL (ref 8–23)
CO2: 27 mmol/L (ref 22–32)
Calcium: 8.9 mg/dL (ref 8.9–10.3)
Chloride: 106 mmol/L (ref 98–111)
Creatinine, Ser: 0.6 mg/dL — ABNORMAL LOW (ref 0.61–1.24)
GFR, Estimated: >60 mL/min (ref >60)
Glucose, Bld: 107 mg/dL — ABNORMAL HIGH (ref 70–99)
Potassium: 4.3 mmol/L (ref 3.5–5.1)
Sodium: 142 mmol/L (ref 135–145)

## 2020-08-15 LAB — GLUCOSE, CAPILLARY
Glucose-Capillary: 107 mg/dL — ABNORMAL HIGH (ref 70–99)
Glucose-Capillary: 163 mg/dL — ABNORMAL HIGH (ref 70–99)
Glucose-Capillary: 171 mg/dL — ABNORMAL HIGH (ref 70–99)
Glucose-Capillary: 219 mg/dL — ABNORMAL HIGH (ref 70–99)

## 2020-08-15 NOTE — Progress Notes (Signed)
[facility]  PROGRESS NOTE  Alexander Beard [id number] DOB: 1955/08/06 DOA: 08/04/2020 PCP: Remi Haggard, FNP   Brief HPI:   65 year old male with history of schizoaffective disorder, bipolar type, autism, COPD, diabetes mellitus type 2, hypertension, hyperlipidemia who presented to ED with severe shortness of breath.  On arrival he was found to have O2 sats of 90% on room air.  He was found to be Covid positive and noted to have infiltrates on chest x-ray.  Vaccination status-vaccinated x2, no booster  Subjective   Patient seen examined, denies any complaints.  Awaiting bed at skilled nursing facility.   Assessment/Plan:     1. Acute hypoxemic respiratory failure-secondary to COVID-19 pneumonia.  He has completed course of remdesivir, on prednisone taper.  Respiratory status is stable.  Patient ready for skilled nursing facility placement. 2. Acute metabolic encephalopathy-due to acute respiratory illness, folic acid normal, [id number] normal.  He has rapidly returned to baseline.  Likely he was overmedicated by his home medications, dose has been reduced.  Awaiting bed at skilled nursing facility. 3. Schizoaffective disorder-longtime group home resident.  Continue Seroquel, clonazepam, Lamictal.  Will restart Zoloft at low-dose of 50 mg daily. 4. Hypokalemia-replete 5. Thrombocytopenia-chronic issue, stable. 6. Diabetes mellitus type 2-CBG well controlled, continue sliding scale insulin with NovoLog. Continue linagliptin.   7. Hypertension-blood pressure well controlled.     COVID-19 Labs  No results for input(s): DDIMER, FERRITIN, LDH, CRP in the last 72 hours.  Lab Results  Component Value Date   SARSCOV2NAA POSITIVE (A) 08/04/2020   [place] NEGATIVE 06/10/2020   [place] NEGATIVE 06/03/2020   [place] NEGATIVE 02/19/2020     Scheduled medications:   . aspirin EC  81 mg Oral Daily  . clonazePAM  1 mg Oral BID  . divalproex  1,000 mg Oral QHS  .  enoxaparin (LOVENOX) injection  40 mg Subcutaneous Q24H  . insulin aspart  0-5 Units Subcutaneous QHS  . insulin aspart  0-9 Units Subcutaneous TID WC  . lamoTRIgine  150 mg Oral QHS  . linagliptin  5 mg Oral Daily  . oxybutynin  10 mg Oral BID  . pantoprazole  40 mg Oral Daily  . QUEtiapine  100 mg Oral TID  . QUEtiapine  300 mg Oral QHS  . sertraline  50 mg Oral Daily  . simethicone  80 mg Oral TID with meals         CBG: Recent Labs  Lab 08/14/20 1205 08/14/20 1618 08/14/20 2051 08/15/20 0739 08/15/20 1122  GLUCAP 123* 116* 186* 107* 163*    SpO2: 96 % O2 Flow Rate (L/min): 2 L/min    CBC: Recent Labs  Lab 08/12/20 0507 08/14/20 0445  WBC 5.0 6.7  HGB 11.7* 12.1*  HCT 34.4* 36.8*  MCV 91.0 92.7  PLT 266 0000000    Basic Metabolic Panel: Recent Labs  Lab 08/10/20 0502 08/14/20 0445 08/15/20 0709  NA 141 140 142  K 3.6 3.0* 4.3  CL 107 105 106  CO2 26 27 27   GLUCOSE 144* 188* 107*  BUN 17 18 14   CREATININE 0.75 0.74 0.60*  CALCIUM 8.7* 8.8* 8.9     Liver Function Tests: Recent Labs  Lab 08/10/20 0502  AST 22  ALT 9  ALKPHOS 54  BILITOT 0.7  PROT 5.7*  ALBUMIN 2.4*     Antibiotics: Anti-infectives (From admission, onward)   Start     Dose/Rate Route Frequency Ordered Stop   08/05/20 1000  remdesivir 100 mg in sodium  chloride 0.9 % 100 mL IVPB       "Followed by" Linked Group Details   100 mg 200 mL/hr over 30 Minutes Intravenous Daily 08/04/20 2117 08/08/20 1140   08/04/20 2115  remdesivir 200 mg in sodium chloride 0.9% 250 mL IVPB       "Followed by" Linked Group Details   200 mg 580 mL/hr over 30 Minutes Intravenous Once 08/04/20 2117 08/05/20 0154       DVT prophylaxis: Lovenox  Code Status: Full code  Family Communication: No family at bedside   Consultants:    Procedures:    Objective   Vitals:   08/14/20 2106 08/15/20 0433 08/15/20 0723 08/15/20 1120  BP: 121/88 114/73 113/72 (!) 149/116  Pulse: 89 80 82 76   Resp: 16 18 16 16   Temp: 98.6 F (37 C) 98.1 F (36.7 C) 98.4 F (36.9 C) 98.7 F (37.1 C)  TempSrc: Axillary Oral Oral   SpO2: 97% 91% 97% 96%    Intake/Output Summary (Last 24 hours) at 08/15/2020 1324 Last data filed at 08/15/2020 0998 Gross per 24 hour  Intake --  Output 100 ml  Net -100 ml     Physical Examination:  General-appears in no acute distress Heart-S1-S2, regular, no murmur auscultated Lungs-clear to auscultation bilaterally, no wheezing or crackles auscultated Abdomen-soft, nontender, no organomegaly Extremities-no edema in the lower extremities Neuro-alert, oriented x3, no focal deficit noted  Status is: Inpatient  Dispo: The patient is from: [place]              Anticipated d/c is to: Skilled nursing facility              Anticipated d/c date is: 08/15/2020              Patient currently medically stable for discharge  Barrier to discharge-awaiting bed at nursing facility.      Data Reviewed:   No results found for this or any previous visit (from the past 240 hour(s)).       Oswald Hillock   [facility] If 7PM-7AM, please contact night-coverage at [email or web address], Office  [phone]   08/15/2020, 1:24 PM  LOS: 10 days            [facility]  PROGRESS NOTE  Alexander Beard [id number]:[id number] DOB: [birth year] DOA: 08/04/2020 PCP: Remi Haggard, FNP   Brief HPI:   65 year old male with history of schizoaffective disorder, bipolar type, autism, COPD, diabetes mellitus type 2, hypertension, hyperlipidemia who presented to ED with severe shortness of breath.  On arrival he was found to have O2 sats of 90% on room air.  He was found to be Covid positive and noted to have infiltrates on chest x-ray.  Vaccination status-vaccinated x2, no booster  Subjective   Patient seen and examined, denies any complaints.  Eating breakfast.   Assessment/Plan:     8. Acute hypoxemic respiratory failure-secondary to COVID-19  pneumonia.  He has completed course of remdesivir, on prednisone taper.  Respiratory status is stable.  Patient ready for skilled nursing facility placement. 9. Acute metabolic encephalopathy-due to acute respiratory illness, folic acid normal, O97 normal.  He has rapidly returned to baseline.  Likely he was overmedicated by his home medications, dose has been reduced.  Awaiting bed at skilled nursing facility. 10. Schizoaffective disorder-longtime group home resident.  Resume home medications once encephalopathy clears. 11. Thrombocytopenia-chronic issue, stable. 12. Diabetes mellitus type 2-CBG well controlled, continue sliding scale insulin with NovoLog. 13. Hypertension-blood pressure  well controlled.     COVID-19 Labs  No results for input(s): DDIMER, FERRITIN, LDH, CRP in the last 72 hours.  Lab Results  Component Value Date   SARSCOV2NAA POSITIVE (A) 08/04/2020   [place] NEGATIVE 06/10/2020   [place] NEGATIVE 06/03/2020   [place] NEGATIVE 02/19/2020     Scheduled medications:   . aspirin EC  81 mg Oral Daily  . clonazePAM  1 mg Oral BID  . divalproex  1,000 mg Oral QHS  . enoxaparin (LOVENOX) injection  40 mg Subcutaneous Q24H  . insulin aspart  0-5 Units Subcutaneous QHS  . insulin aspart  0-9 Units Subcutaneous TID WC  . lamoTRIgine  150 mg Oral QHS  . linagliptin  5 mg Oral Daily  . oxybutynin  10 mg Oral BID  . pantoprazole  40 mg Oral Daily  . QUEtiapine  100 mg Oral TID  . QUEtiapine  300 mg Oral QHS  . sertraline  50 mg Oral Daily  . simethicone  80 mg Oral TID with meals         CBG: Recent Labs  Lab 08/14/20 1205 08/14/20 1618 08/14/20 2051 08/15/20 0739 08/15/20 1122  GLUCAP 123* 116* 186* 107* 163*    SpO2: 96 % O2 Flow Rate (L/min): 2 L/min    CBC: Recent Labs  Lab 08/12/20 0507 08/14/20 0445  WBC 5.0 6.7  HGB 11.7* 12.1*  HCT 34.4* 36.8*  MCV 91.0 92.7  PLT 266 989    Basic Metabolic Panel: Recent Labs  Lab  08/10/20 0502 08/14/20 0445 08/15/20 0709  NA 141 140 142  K 3.6 3.0* 4.3  CL 107 105 106  CO2 26 27 27   GLUCOSE 144* 188* 107*  BUN 17 18 14   CREATININE 0.75 0.74 0.60*  CALCIUM 8.7* 8.8* 8.9     Liver Function Tests: Recent Labs  Lab 08/10/20 0502  AST 22  ALT 9  ALKPHOS 54  BILITOT 0.7  PROT 5.7*  ALBUMIN 2.4*     Antibiotics: Anti-infectives (From admission, onward)   Start     Dose/Rate Route Frequency Ordered Stop   08/05/20 1000  remdesivir 100 mg in sodium chloride 0.9 % 100 mL IVPB       "Followed by" Linked Group Details   100 mg 200 mL/hr over 30 Minutes Intravenous Daily 08/04/20 2117 08/08/20 1140   08/04/20 2115  remdesivir 200 mg in sodium chloride 0.9% 250 mL IVPB       "Followed by" Linked Group Details   200 mg 580 mL/hr over 30 Minutes Intravenous Once 08/04/20 2117 08/05/20 0154       DVT prophylaxis: Lovenox  Code Status: Full code  Family Communication: No family at bedside   Consultants:    Procedures:    Objective   Vitals:   08/14/20 2106 08/15/20 0433 08/15/20 0723 08/15/20 1120  BP: 121/88 114/73 113/72 (!) 149/116  Pulse: 89 80 82 76  Resp: 16 18 16 16   Temp: 98.6 F (37 C) 98.1 F (36.7 C) 98.4 F (36.9 C) 98.7 F (37.1 C)  TempSrc: Axillary Oral Oral   SpO2: 97% 91% 97% 96%    Intake/Output Summary (Last 24 hours) at 08/15/2020 1324 Last data filed at 08/15/2020 0855 Gross per 24 hour  Intake --  Output 100 ml  Net -100 ml    No intake/output data recorded.  There were no vitals filed for this visit.  Physical Examination:   General-appears in no acute distress Heart-S1-S2, regular, no murmur auscultated  Lungs-clear to auscultation bilaterally, no wheezing or crackles auscultated Abdomen-soft, nontender, no organomegaly Extremities-no edema in the lower extremities Neuro-alert, oriented x3, no focal deficit noted  Status is: Inpatient  Dispo: The patient is from: [place]               Anticipated d/c is to: Skilled nursing facility              Anticipated d/c date is: 08/14/2020              Patient currently medically stable for discharge  Barrier to discharge-awaiting bed at nursing facility.      Data Reviewed:   No results found for this or any previous visit (from the past 240 hour(s)).       Oswald Hillock   [facility] If 7PM-7AM, please contact night-coverage at [email or web address], Office  [phone]   08/15/2020, 1:24 PM  LOS: 10 days

## 2020-08-15 NOTE — Progress Notes (Signed)
Physical Therapy Treatment Patient Details Name: Alexander Beard MRN: [id number] DOB: [birth year] Today's Date: 08/15/2020    History of Present Illness 65 y.o. male with medical history significant for schizoaffective disorder, bipolar type, autism spectrum disorder, COPD, T2DM, HTN, HLD who presents to the ED for evaluation of shortness of breath. Covid-19+ on 08/04/20.     Per brother, patient recently had a prolonged behavioral health hospital stay from Aug - Dec 2021 before discharged to group care home in [place] which is a new environment for him.  Brother states that he has noted significant "grogginess" over the last few weeks since medication schedule changes (4x/day to 2x/day).  This has coincided with several falls that have not resulted in any significant injuries.    PT Comments    Pt seen for PT treatment with pt agreeable to participation. Pt performs BLE strengthening exercises while seated in recliner with PT providing demo & instructional cuing for technique. Pt ambulates short distance in room with RW & significant assistance 2/2 significantly impaired gait pattern as noted below. Pt also performs sit<>stands for strengthening. Unsure if pt is limited by impaired motor planning or cognitive processing or other underlying issue. Pt would benefit from +2 to safely mobilize during next PT session. Continue to recommend STR upon d/c to maximize independence with functional mobility, reduce fall risk, and reduce need for caregiver assistance prior to return home.     Follow Up Recommendations  SNF;Supervision/Assistance - 24 hour;Supervision for mobility/OOB     Equipment Recommendations  Rolling walker with 5" wheels    Recommendations for Other Services       Precautions / Restrictions Precautions Precautions: Fall Restrictions Weight Bearing Restrictions: No    Mobility  Bed Mobility                  Transfers Overall transfer level: Needs assistance    Transfers: Sit to/from Stand   Stand pivot transfers: Min assist;Max assist       General transfer comment: Pt completes sit<>stand from recliner with mulitmodal cuing to push on armrests vs pulling on RW. Pt intermittently able to stand with min assist but increases to max assist as session progresses. Pt with heavy reliance on BUE with minimal weight bearing through BLE to allow increased ease of transfer of BUE to RW from armrests. Pt with some posterior lean noted & keeps B knees/hips flexed.  Ambulation/Gait Ambulation/Gait assistance: Min assist;Max assist Gait Distance (Feet): 12 Feet Assistive device: Rolling walker (2 wheeled)   Gait velocity: decreased   General Gait Details: Pt requires min increasing to MAX assist to ambulate short distance in room with RW. Pt initially able to take 2-3 steps forwards then progresses to maintaining B hips/knees flexed throughout and performs more of a stomping vertical movement instead of forward progression for gait (when asked why he stomps pt states "it helps me"). PT attempts to give pt visual cue to increase step length with only minimal improvement noted. Pt requires max/total assist to safely turn to sit in recliner at end of ambulation trial. Would benefit from +2/chair follow for safety.   Stairs             Wheelchair Mobility    Modified Rankin (Stroke Patients Only)       Balance Overall balance assessment: Needs assistance         Standing balance support: Bilateral upper extremity supported;During functional activity Standing balance-Leahy Scale: Poor Standing balance comment: Significant UE reliance on RW,  requires cues for RW management                            Cognition Arousal/Alertness: Awake/alert Behavior During Therapy: [facility] for tasks assessed/performed Overall Cognitive Status: History of cognitive impairments - at baseline Area of Impairment: Orientation;Attention;Memory;Following  commands;Safety/judgement;Awareness;Problem solving                 Orientation Level: Disoriented to;Time;Situation   Memory: Decreased recall of precautions;Decreased short-term memory Following Commands: Follows one step commands consistently;Follows one step commands with increased time Safety/Judgement: Decreased awareness of safety;Decreased awareness of deficits Awareness: Intellectual;Emergent;Anticipatory Problem Solving: Slow processing;Requires tactile cues;Requires verbal cues        Exercises General Exercises - Lower Extremity Long Arc Quad: AROM;Strengthening;Right;Left;20 reps;Seated Hip ABduction/ADduction: AROM;Strengthening;Right;Left;20 reps;Seated    General Comments General comments (skin integrity, edema, etc.): Pt received & left with art supplies (crayons & coloring books) upon pt request. Pt performs 5x sit<>Stand with max assist for strengthening, pt unable to attempt without UE support.      Pertinent Vitals/Pain Pain Assessment: No/denies pain    Home Living                      Prior Function            PT Goals (current goals can now be found in the care plan section) Acute Rehab PT Goals Patient Stated Goal: none stated PT Goal Formulation: Patient unable to participate in goal setting Time For Goal Achievement: 08/21/20 Potential to Achieve Goals: Fair Progress towards PT goals: Progressing toward goals    Frequency    Min 2X/week      PT Plan Current plan remains appropriate    Co-evaluation              AM-PAC PT "6 Clicks" Mobility   Outcome Measure  Help needed turning from your back to your side while in a flat bed without using bedrails?: None Help needed moving from lying on your back to sitting on the side of a flat bed without using bedrails?: A Little Help needed moving to and from a bed to a chair (including a wheelchair)?: A Lot Help needed standing up from a chair using your arms (e.g.,  wheelchair or bedside chair)?: A Lot Help needed to walk in hospital room?: A Lot Help needed climbing 3-5 steps with a railing? : Total 6 Click Score: 14    End of Session Equipment Utilized During Treatment: Gait belt Activity Tolerance: Patient tolerated treatment well Patient left: in chair;with call bell/phone within reach;with chair alarm set Nurse Communication: Mobility status PT Visit Diagnosis: Muscle weakness (generalized) (M62.81);Difficulty in walking, not elsewhere classified (R26.2)     Time: 3716-9678 PT Time Calculation (min) (ACUTE ONLY): 23 min  Charges:  $Therapeutic Exercise: 8-22 mins $Therapeutic Activity: 8-22 mins                     Lavone Nian, PT, DPT 08/15/20, 2:52 PM    Waunita Schooner 08/15/2020, 2:50 PM

## 2020-08-16 DIAGNOSIS — F250 Schizoaffective disorder, bipolar type: Secondary | ICD-10-CM | POA: Diagnosis not present

## 2020-08-16 DIAGNOSIS — J189 Pneumonia, unspecified organism: Secondary | ICD-10-CM | POA: Diagnosis present

## 2020-08-16 DIAGNOSIS — J1282 Pneumonia due to coronavirus disease 2019: Secondary | ICD-10-CM | POA: Diagnosis not present

## 2020-08-16 DIAGNOSIS — U071 COVID-19: Secondary | ICD-10-CM | POA: Diagnosis not present

## 2020-08-16 DIAGNOSIS — R4182 Altered mental status, unspecified: Secondary | ICD-10-CM | POA: Diagnosis not present

## 2020-08-16 LAB — GLUCOSE, CAPILLARY
Glucose-Capillary: 121 mg/dL — ABNORMAL HIGH (ref 70–99)
Glucose-Capillary: 157 mg/dL — ABNORMAL HIGH (ref 70–99)
Glucose-Capillary: 168 mg/dL — ABNORMAL HIGH (ref 70–99)
Glucose-Capillary: 184 mg/dL — ABNORMAL HIGH (ref 70–99)

## 2020-08-16 NOTE — Progress Notes (Deleted)
SATURATION QUALIFICATIONS: (This note is used to comply with regulatory documentation for home oxygen)  Patient Saturations on Room Air at Rest = 89%  Patient Saturations on Room Air while Ambulating = 84%  Patient Saturations on 4 Liters of oxygen while Ambulating = 90%  Please briefly explain why patient needs home oxygen: 

## 2020-08-16 NOTE — Progress Notes (Signed)
[facility]  PROGRESS NOTE  RUSSEL MORAIN ZSW:[id number] DOB: 1956/03/21 DOA: 08/04/2020 PCP: Remi Haggard, FNP   Brief HPI:   65 year old male with history of schizoaffective disorder, bipolar type, autism, COPD, diabetes mellitus type 2, hypertension, hyperlipidemia who presented to ED with severe shortness of breath.  On arrival he was found to have O2 sats of 90% on room air.  He was found to be Covid positive and noted to have infiltrates on chest x-ray.  Vaccination status-vaccinated x2, no booster  Subjective   Patient seen and examined, fell this morning however no injury observed.  Sitting in bed comfortably.   Assessment/Plan:     1. Acute hypoxemic respiratory failure-secondary to COVID-19 pneumonia.  He has completed course of remdesivir, on prednisone taper.  Respiratory status is stable.  Patient ready for skilled nursing facility placement. 2. Acute metabolic encephalopathy-due to acute respiratory illness, folic acid normal, D22 normal.  He has rapidly returned to baseline.  Likely he was overmedicated by his home medications, dose has been reduced.  Awaiting bed at skilled nursing facility. 3. Schizoaffective disorder-longtime group home resident.  Continue Seroquel, clonazepam, Lamictal.  Will restart Zoloft at low-dose of 50 mg daily. 4. Hypokalemia-replete 5. Thrombocytopenia-chronic issue, stable. 6. Diabetes mellitus type 2-CBG well controlled, continue sliding scale insulin with NovoLog. Continue linagliptin.   7. Hypertension-blood pressure well controlled.     COVID-19 Labs  No results for input(s): DDIMER, FERRITIN, LDH, CRP in the last 72 hours.  Lab Results  Component Value Date   SARSCOV2NAA POSITIVE (A) 08/04/2020   [place] NEGATIVE 06/10/2020   [place] NEGATIVE 06/03/2020   [place] NEGATIVE 02/19/2020     Scheduled medications:   . aspirin EC  81 mg Oral Daily  . clonazePAM  1 mg Oral BID  . divalproex  1,000 mg Oral  QHS  . enoxaparin (LOVENOX) injection  40 mg Subcutaneous Q24H  . insulin aspart  0-5 Units Subcutaneous QHS  . insulin aspart  0-9 Units Subcutaneous TID WC  . lamoTRIgine  150 mg Oral QHS  . linagliptin  5 mg Oral Daily  . oxybutynin  10 mg Oral BID  . pantoprazole  40 mg Oral Daily  . QUEtiapine  100 mg Oral TID  . QUEtiapine  300 mg Oral QHS  . sertraline  50 mg Oral Daily  . simethicone  80 mg Oral TID with meals         CBG: Recent Labs  Lab 08/15/20 1122 08/15/20 1521 08/15/20 2009 08/16/20 0744 08/16/20 1130  GLUCAP 163* 171* 219* 121* 157*    SpO2: 96 % O2 Flow Rate (L/min): 2 L/min    CBC: Recent Labs  Lab 08/12/20 0507 08/14/20 0445  WBC 5.0 6.7  HGB 11.7* 12.1*  HCT 34.4* 36.8*  MCV 91.0 92.7  PLT 266 025    Basic Metabolic Panel: Recent Labs  Lab 08/10/20 0502 08/14/20 0445 08/15/20 0709  NA 141 140 142  K 3.6 3.0* 4.3  CL 107 105 106  CO2 26 27 27   GLUCOSE 144* 188* 107*  BUN 17 18 14   CREATININE 0.75 0.74 0.60*  CALCIUM 8.7* 8.8* 8.9     Liver Function Tests: Recent Labs  Lab 08/10/20 0502  AST 22  ALT 9  ALKPHOS 54  BILITOT 0.7  PROT 5.7*  ALBUMIN 2.4*     Antibiotics: Anti-infectives (From admission, onward)   Start     Dose/Rate Route Frequency Ordered Stop   08/05/20 1000  remdesivir 100  mg in sodium chloride 0.9 % 100 mL IVPB       "Followed by" Linked Group Details   100 mg 200 mL/hr over 30 Minutes Intravenous Daily 08/04/20 2117 08/08/20 1140   08/04/20 2115  remdesivir 200 mg in sodium chloride 0.9% 250 mL IVPB       "Followed by" Linked Group Details   200 mg 580 mL/hr over 30 Minutes Intravenous Once 08/04/20 2117 08/05/20 0154       DVT prophylaxis: Lovenox  Code Status: Full code  Family Communication: No family at bedside   Consultants:    Procedures:    Objective   Vitals:   08/15/20 2006 08/16/20 0535 08/16/20 0744 08/16/20 0932  BP: 117/69 119/76 120/85 102/64  Pulse: 71 82  (!) 56 67  Resp: 16 18 15 16   Temp: 97.9 F (36.6 C) 98.5 F (36.9 C)  98 F (36.7 C)  TempSrc: Oral  Oral Oral  SpO2: 96% 95% 100% 96%   No intake or output data in the 24 hours ending 08/16/20 1331   Physical Examination:  General-appears in no acute distress Heart-S1-S2, regular, no murmur auscultated Lungs-clear to auscultation bilaterally, no wheezing or crackles auscultated Abdomen-soft, nontender, no organomegaly Extremities-no edema in the lower extremities Neuro-alert, oriented x3, no focal deficit noted  Status is: Inpatient  Dispo: The patient is from: [place]              Anticipated d/c is to: Skilled nursing facility              Anticipated d/c date is: 08/17/2020              Patient currently medically stable for discharge  Barrier to discharge-awaiting bed at nursing facility.      Data Reviewed:   No results found for this or any previous visit (from the past 240 hour(s)).       Oswald Hillock   [facility] If 7PM-7AM, please contact night-coverage at [email or web address], Office  [phone]   08/16/2020, 1:31 PM  LOS: 11 days

## 2020-08-16 NOTE — Progress Notes (Signed)
Patient had an unwitnessed fall this morning around 0730.  Patient' VSS.  Denies pain, denies injury.  Patient was assisted to the bed by staff.  This Probation officer notified the [facility] and Sherlene Shams, 1A Surveyor, quantity.

## 2020-08-16 NOTE — Progress Notes (Signed)
   08/16/20 0725  What Happened  Was fall witnessed? No  Was patient injured? No  Patient found on floor  Found by Staff-comment  Stated prior activity other (comment) (stated he was reaching for his artwork)  Follow Up  MD notified Georgiann Mohs MD  Time MD notified 0730  Family notified Yes - comment  Time family notified 0815  Additional tests No  Progress note created (see row info) Yes  Adult Fall Risk Assessment  Risk Factor Category (scoring not indicated) High fall risk per protocol (document High fall risk)  Age 65  Fall History: Fall within 6 months prior to admission 5  Elimination; Bowel and/or Urine Incontinence 2  Elimination; Bowel and/or Urine Urgency/Frequency 2  Medications: includes PCA/Opiates, Anti-convulsants, Anti-hypertensives, Diuretics, Hypnotics, Laxatives, Sedatives, and Psychotropics 3  Patient Care Equipment 1  Mobility-Assistance 2  Mobility-Gait 2  Mobility-Sensory Deficit 2  Altered awareness of immediate physical environment 1  Impulsiveness 2  Lack of understanding of one's physical/cognitive limitations 4  Total Score 27  Patient Fall Risk Level High fall risk  Adult Fall Risk Interventions  Required Bundle Interventions *See Row Information* High fall risk - low, moderate, and high requirements implemented  Additional Interventions Room near nurses station;PT/OT need assessed if change in mobility from baseline;Reorient/diversional activities with confused patients;Use of appropriate toileting equipment (bedpan, BSC, etc.)  Screening for Fall Injury Risk (To be completed on HIGH fall risk patients) - Assessing Need for Low Bed  Risk For Fall Injury- Low Bed Criteria Previous fall this admission  Will Implement Low Bed and Floor Mats Yes  Screening for Fall Injury Risk (To be completed on HIGH fall risk patients who do not meet crieteria for Low Bed) - Assessing Need for Floor Mats Only  Risk For Fall Injury- Criteria for Floor Mats Confusion/dementia  (+NuDESC, CIWA, TBI, etc.)  Will Implement Floor Mats Yes  Pain Assessment  Pain Scale 0-10  Pain Score 0  Neurological  Neuro (WDL) WDL  Neuro Symptoms Tremors;Anxiety  Neuro symptoms relieved by Anti-anxiety medication  Glasgow Coma Scale  Eye Opening 4  Best Verbal Response (NON-intubated) 5  Modified Verbal Response (INTUBATED) 5  Best Motor Response 6  Glasgow Coma Scale Score (!) 20  Musculoskeletal  Musculoskeletal (WDL) X  Generalized Weakness Yes  Integumentary  Integumentary (WDL) WDL

## 2020-08-16 NOTE — TOC Progression Note (Signed)
Transition of Care [facility]) - Progression Note    Patient Details  Name: Alexander Beard MRN: [id number] Date of Birth: 1955/12/17  [facility]) CM/SW Contact  Izola Price, RN Phone Number: 08/16/2020, 5:07 PM  Clinical Narrative:   08/16/20 No beds accepted as of 1700 08/16/20. Simmie Davies RN CM    Expected Discharge Plan: Group Home Barriers to Discharge: Continued Medical Work up  Expected Discharge Plan and Services Expected Discharge Plan: Group Home   Discharge Planning Services: CM Consult   Living arrangements for the past 2 months: Group Home                                       Social Determinants of Health (SDOH) Interventions    Readmission Risk Interventions Readmission Risk Prevention Plan 08/07/2020  Transportation Screening Complete  Medication Review [employer]) Complete  PCP or Specialist appointment within 3-5 days of discharge Complete  HRI or Home Care Consult Complete  SW Recovery Care/Counseling Consult Complete  Palliative Care Screening Not [place] Complete  Some recent data might be hidden

## 2020-08-17 DIAGNOSIS — J1282 Pneumonia due to coronavirus disease 2019: Secondary | ICD-10-CM | POA: Diagnosis not present

## 2020-08-17 DIAGNOSIS — R4182 Altered mental status, unspecified: Secondary | ICD-10-CM | POA: Diagnosis not present

## 2020-08-17 DIAGNOSIS — U071 COVID-19: Secondary | ICD-10-CM | POA: Diagnosis not present

## 2020-08-17 DIAGNOSIS — F250 Schizoaffective disorder, bipolar type: Secondary | ICD-10-CM | POA: Diagnosis not present

## 2020-08-17 LAB — GLUCOSE, CAPILLARY
Glucose-Capillary: 281 mg/dL — ABNORMAL HIGH (ref 70–99)
Glucose-Capillary: 86 mg/dL (ref 70–99)
Glucose-Capillary: 98 mg/dL (ref 70–99)
Glucose-Capillary: 160 mg/dL — ABNORMAL HIGH (ref 70–99)

## 2020-08-17 NOTE — Progress Notes (Signed)
Occupational Therapy Treatment Patient Details Name: Alexander Beard MRN: [id number] DOB: [birth year] Today's Date: 08/17/2020    History of present illness 65 y.o. male with medical history significant for schizoaffective disorder, bipolar type, autism spectrum disorder, COPD, T2DM, HTN, HLD who presents to the ED for evaluation of shortness of breath. Covid-19+ on 08/04/20.     Per brother, patient recently had a prolonged behavioral health hospital stay from Aug - Dec 2021 before discharged to group care home in [place] which is a new environment for him.  Brother states that he has noted significant "grogginess" over the last few weeks since medication schedule changes (4x/day to 2x/day).  This has coincided with several falls that have not resulted in any significant injuries.   OT comments  Upon entering the room, pt supine in bed with no c/o pain. Pt reports need to use bathroom and also has food and drink all over hospital gown. Pt is agreeable to transfer to [facility]. Pt standing with mod A and performing stand pivot transfer to [facility]. While seated on commode, OT sets pt up with items needed to wash himself. Pt needing mod verbal cuing for sequencing and initiation of task. Pt shows limited insight to deficits by making statements like, " I think I can do all of this on my own" in regards to transfers. Pt has fall several times and OT continues safety awareness/fall risk education. Pt donning hospital gown with min A and transfers back to bed with mod A. OT repositioned pt in bed with all needs within reach, bed lowered, and bed alarm activated. Pt continues to benefit from OT intervention and recommendation for short term rehab to address functional deficits.    Follow Up Recommendations  SNF    Equipment Recommendations  Other (comment) (defer to next venue of care)       Precautions / Restrictions Precautions Precautions: Fall Restrictions Weight Bearing Restrictions: No        Mobility Bed Mobility Overal bed mobility: Needs Assistance Bed Mobility: Supine to Sit     Supine to sit: Supervision Sit to supine: Supervision   General bed mobility comments: min cuing for technique  Transfers Overall transfer level: Needs assistance Equipment used: Rolling walker (2 wheeled) Transfers: Sit to/from Stand Sit to Stand: From elevated surface;Mod assist Stand pivot transfers: Mod assist       General transfer comment: Pt needing mod lifting assistance for transfer [facility] <> bed    Balance Overall balance assessment: Needs assistance Sitting-balance support: No upper extremity supported;Feet supported Sitting balance-Leahy Scale: Good Sitting balance - Comments: no LOB seated on BSC for toileting needs   Standing balance support: Bilateral upper extremity supported;During functional activity Standing balance-Leahy Scale: Poor Standing balance comment: Significant UE reliance on RW, requires cues for RW management                           ADL either performed or assessed with clinical judgement   ADL Overall ADL's : Needs assistance/impaired     Grooming: Sitting;Wash/dry hands;Wash/dry face;Set up;Supervision/safety   Upper Body Bathing: Supervision/ safety;Set up;Sitting       Upper Body Dressing : Minimal assistance;Supervision/safety;Sitting                     General ADL Comments: Pt bathing while seated on BSC this session. Pt needing mod cuing for sequencing and initiation of task.     Vision Patient Visual Report: No  change from baseline            Cognition Arousal/Alertness: Awake/alert Behavior During Therapy: WFL for tasks assessed/performed Overall Cognitive Status: History of cognitive impairments - at baseline                   Orientation Level: Disoriented to;Time;Situation Current Attention Level: Focused Memory: Decreased recall of precautions;Decreased short-term memory Following Commands:  Follows one step commands consistently;Follows one step commands with increased time Safety/Judgement: Decreased awareness of safety;Decreased awareness of deficits Awareness: Intellectual Problem Solving: Slow processing;Requires tactile cues;Requires verbal cues General Comments: min - mod cuing for sequencing and initiation of self care tasks                   Pertinent Vitals/ Pain       Pain Assessment: No/denies pain         Frequency  Min 2X/week        Progress Toward Goals  OT Goals(current goals can now be found in the care plan section)  Progress towards OT goals: Progressing toward goals  Acute Rehab OT Goals Patient Stated Goal: none stated OT Goal Formulation: With patient Time For Goal Achievement: 08/20/20 Potential to Achieve Goals: Good  Plan Discharge plan remains appropriate;Frequency remains appropriate       AM-PAC OT "6 Clicks" Daily Activity     Outcome Measure   Help from another person eating meals?: None Help from another person taking care of personal grooming?: A Little Help from another person toileting, which includes using toliet, bedpan, or urinal?: A Lot Help from another person bathing (including washing, rinsing, drying)?: A Lot Help from another person to put on and taking off regular upper body clothing?: A Little Help from another person to put on and taking off regular lower body clothing?: A Lot 6 Click Score: 16    End of Session    OT Visit Diagnosis: Unsteadiness on feet (R26.81);History of falling (Z91.81)   Activity Tolerance Patient tolerated treatment well   Patient Left in bed;with call bell/phone within reach;with bed alarm set   Nurse Communication Mobility status        Time: 1355-1433 OT Time Calculation (min): 38 min  Charges: OT General Charges $OT Visit: 1 Visit OT Treatments $Self Care/Home Management : 38-52 mins  Darleen Crocker, MS, OTR/L , CBIS ascom [phone]  08/17/20, 4:23  PM

## 2020-08-17 NOTE — Progress Notes (Signed)
[facility]  PROGRESS NOTE  Alexander Beard VOZ:[id number] DOB: 1955-12-26 DOA: 08/04/2020 PCP: Remi Haggard, FNP   Brief HPI:   65 year old male with history of schizoaffective disorder, bipolar type, autism, COPD, diabetes mellitus type 2, hypertension, hyperlipidemia who presented to ED with severe shortness of breath.  On arrival he was found to have O2 sats of 90% on room air.  He was found to be Covid positive and noted to have infiltrates on chest x-ray.  Vaccination status-vaccinated x2, no booster  Subjective   Patient seen and examined, denies any complaints.  Awaiting bed at skilled nursing facility.   Assessment/Plan:     1. Acute hypoxemic respiratory failure-secondary to COVID-19 pneumonia.  He has completed course of remdesivir, on prednisone taper.  Respiratory status is stable.  Patient ready for skilled nursing facility placement. 2. Acute metabolic encephalopathy-due to acute respiratory illness, folic acid normal, Q25 normal.  He has rapidly returned to baseline.  Likely he was overmedicated by his home medications, dose has been reduced.  Awaiting bed at skilled nursing facility. 3. Schizoaffective disorder-longtime group home resident.  Continue Seroquel, clonazepam, Lamictal.  Will restart Zoloft at low-dose of 50 mg daily. 4. Hypokalemia-replete 5. Thrombocytopenia-chronic issue, stable. 6. Diabetes mellitus type 2-CBG well controlled, continue sliding scale insulin with NovoLog. Continue linagliptin.   7. Hypertension-blood pressure well controlled.     COVID-19 Labs  No results for input(s): DDIMER, FERRITIN, LDH, CRP in the last 72 hours.  Lab Results  Component Value Date   SARSCOV2NAA POSITIVE (A) 08/04/2020   [place] NEGATIVE 06/10/2020   [place] NEGATIVE 06/03/2020   [place] NEGATIVE 02/19/2020     Scheduled medications:   . aspirin EC  81 mg Oral Daily  . clonazePAM  1 mg Oral BID  . divalproex  1,000 mg Oral QHS  .  enoxaparin (LOVENOX) injection  40 mg Subcutaneous Q24H  . insulin aspart  0-5 Units Subcutaneous QHS  . insulin aspart  0-9 Units Subcutaneous TID WC  . lamoTRIgine  150 mg Oral QHS  . linagliptin  5 mg Oral Daily  . oxybutynin  10 mg Oral BID  . pantoprazole  40 mg Oral Daily  . QUEtiapine  100 mg Oral TID  . QUEtiapine  300 mg Oral QHS  . sertraline  50 mg Oral Daily  . simethicone  80 mg Oral TID with meals         CBG: Recent Labs  Lab 08/16/20 0744 08/16/20 1130 08/16/20 1625 08/16/20 2009 08/17/20 0733  GLUCAP 121* 157* 168* 184* 281*    SpO2: 93 % O2 Flow Rate (L/min): 2 L/min    CBC: Recent Labs  Lab 08/12/20 0507 08/14/20 0445  WBC 5.0 6.7  HGB 11.7* 12.1*  HCT 34.4* 36.8*  MCV 91.0 92.7  PLT 266 956    Basic Metabolic Panel: Recent Labs  Lab 08/14/20 0445 08/15/20 0709  NA 140 142  K 3.0* 4.3  CL 105 106  CO2 27 27  GLUCOSE 188* 107*  BUN 18 14  CREATININE 0.74 0.60*  CALCIUM 8.8* 8.9     Liver Function Tests: No results for input(s): AST, ALT, ALKPHOS, BILITOT, PROT, ALBUMIN in the last 168 hours.   Antibiotics: Anti-infectives (From admission, onward)   Start     Dose/Rate Route Frequency Ordered Stop   08/05/20 1000  remdesivir 100 mg in sodium chloride 0.9 % 100 mL IVPB       "Followed by" Linked Group Details   100  mg 200 mL/hr over 30 Minutes Intravenous Daily 08/04/20 2117 08/08/20 1140   08/04/20 2115  remdesivir 200 mg in sodium chloride 0.9% 250 mL IVPB       "Followed by" Linked Group Details   200 mg 580 mL/hr over 30 Minutes Intravenous Once 08/04/20 2117 08/05/20 0154       DVT prophylaxis: Lovenox  Code Status: Full code  Family Communication: No family at bedside   Consultants:    Procedures:    Objective   Vitals:   08/16/20 1924 08/17/20 0000 08/17/20 0346 08/17/20 0734  BP: 113/75 135/80 108/78 101/74  Pulse: 76 67 (!) 52 75  Resp: 16 20 14 16   Temp: 97.9 F (36.6 C) 98 F (36.7 C)  97.8 F (36.6 C) 98 F (36.7 C)  TempSrc:      SpO2: 100% 99% 94% 93%    Intake/Output Summary (Last 24 hours) at 08/17/2020 1147 Last data filed at 08/17/2020 0400 Gross per 24 hour  Intake 120 ml  Output 325 ml  Net -205 ml     Physical Examination:  General-appears in no acute distress Heart-S1-S2, regular, no murmur auscultated Lungs-clear to auscultation bilaterally, no wheezing or crackles auscultated Abdomen-soft, nontender, no organomegaly Extremities-no edema in the lower extremities Neuro-alert, oriented x3, no focal deficit noted  Status is: Inpatient  Dispo: The patient is from: [place]              Anticipated d/c is to: Skilled nursing facility              Anticipated d/c date is: 08/18/2020              Patient currently medically stable for discharge  Barrier to discharge-awaiting bed at nursing facility.      Data Reviewed:   No results found for this or any previous visit (from the past 240 hour(s)).       Oswald Hillock   [facility] If 7PM-7AM, please contact night-coverage at [email or web address], Office  [phone]   08/17/2020, 11:47 AM  LOS: 12 days

## 2020-08-18 ENCOUNTER — Encounter: Payer: Self-pay | Admitting: Family Medicine

## 2020-08-18 DIAGNOSIS — R4182 Altered mental status, unspecified: Secondary | ICD-10-CM | POA: Diagnosis not present

## 2020-08-18 DIAGNOSIS — U071 COVID-19: Secondary | ICD-10-CM | POA: Diagnosis not present

## 2020-08-18 DIAGNOSIS — J1282 Pneumonia due to coronavirus disease 2019: Secondary | ICD-10-CM | POA: Diagnosis not present

## 2020-08-18 DIAGNOSIS — F840 Autistic disorder: Secondary | ICD-10-CM

## 2020-08-18 DIAGNOSIS — F250 Schizoaffective disorder, bipolar type: Secondary | ICD-10-CM | POA: Diagnosis not present

## 2020-08-18 HISTORY — DX: Autistic disorder: F84.0

## 2020-08-18 LAB — GLUCOSE, CAPILLARY
Glucose-Capillary: 100 mg/dL — ABNORMAL HIGH (ref 70–99)
Glucose-Capillary: 253 mg/dL — ABNORMAL HIGH (ref 70–99)
Glucose-Capillary: 90 mg/dL (ref 70–99)
Glucose-Capillary: 109 mg/dL — ABNORMAL HIGH (ref 70–99)

## 2020-08-18 LAB — CBC
HCT: 35.7 % — ABNORMAL LOW (ref 39.0–52.0)
Hemoglobin: 11.5 g/dL — ABNORMAL LOW (ref 13.0–17.0)
MCH: 30.3 pg (ref 26.0–34.0)
MCHC: 32.2 g/dL (ref 30.0–36.0)
MCV: 94.2 fL (ref 80.0–100.0)
Platelets: 215 10*3/uL (ref 150–400)
RBC: 3.79 MIL/uL — ABNORMAL LOW (ref 4.22–5.81)
RDW: 14.9 % (ref 11.5–15.5)
WBC: 5.3 10*3/uL (ref 4.0–10.5)
nRBC: 0 % (ref 0.0–0.2)

## 2020-08-18 NOTE — Progress Notes (Signed)
[facility]  PROGRESS NOTE  Alexander Beard NLZ:[id number] DOB: 1956-02-23 DOA: 08/04/2020 PCP: Remi Haggard, FNP   Brief HPI:   65 year old male with history of schizoaffective disorder, bipolar type, autism, COPD, diabetes mellitus type 2, hypertension, hyperlipidemia who presented to ED with severe shortness of breath.  On arrival he was found to have O2 sats of 90% on room air.  He was found to be Covid positive and noted to have infiltrates on chest x-ray.  Vaccination status-vaccinated x2, no booster  Subjective   Patient seen and examined, denies any complaints.   Assessment/Plan:     1. Acute hypoxemic respiratory failure-secondary to COVID-19 pneumonia.  Resolved.  He has completed course of remdesivir, on prednisone taper.  Respiratory status is stable.  He is currently not requiring oxygen.  Patient ready for skilled nursing facility placement. 2. Acute metabolic encephalopathy-significantly improved, due to acute respiratory illness, folic acid normal, T02 normal.  He has rapidly returned to baseline.  Likely he was overmedicated by his home medications, dose has been reduced.  Awaiting bed at skilled nursing facility. 3. Schizoaffective disorder-longtime group home resident.  Continue Seroquel, clonazepam, Lamicta, Zoloft at low-dose of 50 mg daily.   4. Hypokalemia-replete 5. Thrombocytopenia-chronic issue, stable. 6. Diabetes mellitus type 2-CBG well controlled, continue sliding scale insulin with NovoLog. Continue linagliptin.   7. Hypertension-blood pressure well controlled.     COVID-19 Labs  No results for input(s): DDIMER, FERRITIN, LDH, CRP in the last 72 hours.  Lab Results  Component Value Date   SARSCOV2NAA POSITIVE (A) 08/04/2020   [place] NEGATIVE 06/10/2020   [place] NEGATIVE 06/03/2020   [place] NEGATIVE 02/19/2020     Scheduled medications:   . aspirin EC  81 mg Oral Daily  . clonazePAM  1 mg Oral BID  . divalproex  1,000  mg Oral QHS  . enoxaparin (LOVENOX) injection  40 mg Subcutaneous Q24H  . insulin aspart  0-5 Units Subcutaneous QHS  . insulin aspart  0-9 Units Subcutaneous TID WC  . lamoTRIgine  150 mg Oral QHS  . linagliptin  5 mg Oral Daily  . oxybutynin  10 mg Oral BID  . pantoprazole  40 mg Oral Daily  . QUEtiapine  100 mg Oral TID  . QUEtiapine  300 mg Oral QHS  . sertraline  50 mg Oral Daily  . simethicone  80 mg Oral TID with meals         CBG: Recent Labs  Lab 08/17/20 1200 08/17/20 1615 08/17/20 2120 08/18/20 0743 08/18/20 1145  GLUCAP 86 98 160* 100* 253*    SpO2: 95 % O2 Flow Rate (L/min): 2 L/min    CBC: Recent Labs  Lab 08/12/20 0507 08/14/20 0445 08/18/20 0537  WBC 5.0 6.7 5.3  HGB 11.7* 12.1* 11.5*  HCT 34.4* 36.8* 35.7*  MCV 91.0 92.7 94.2  PLT 266 261 409    Basic Metabolic Panel: Recent Labs  Lab 08/14/20 0445 08/15/20 0709  NA 140 142  K 3.0* 4.3  CL 105 106  CO2 27 27  GLUCOSE 188* 107*  BUN 18 14  CREATININE 0.74 0.60*  CALCIUM 8.8* 8.9     Liver Function Tests: No results for input(s): AST, ALT, ALKPHOS, BILITOT, PROT, ALBUMIN in the last 168 hours.   Antibiotics: Anti-infectives (From admission, onward)   Start     Dose/Rate Route Frequency Ordered Stop   08/05/20 1000  remdesivir 100 mg in sodium chloride 0.9 % 100 mL IVPB       "  Followed by" Linked Group Details   100 mg 200 mL/hr over 30 Minutes Intravenous Daily 08/04/20 2117 08/08/20 1140   08/04/20 2115  remdesivir 200 mg in sodium chloride 0.9% 250 mL IVPB       "Followed by" Linked Group Details   200 mg 580 mL/hr over 30 Minutes Intravenous Once 08/04/20 2117 08/05/20 0154       DVT prophylaxis: Lovenox  Code Status: Full code  Family Communication: No family at bedside   Consultants:    Procedures:    Objective   Vitals:   08/17/20 2348 08/18/20 0415 08/18/20 0743 08/18/20 1145  BP: 101/69 119/75 123/69 117/68  Pulse: 80 79 71 91  Resp: 20 18 16  16   Temp: 98 F (36.7 C) 98 F (36.7 C) 97.6 F (36.4 C) 98.3 F (36.8 C)  TempSrc:      SpO2: 99% 96% 94% 95%    Intake/Output Summary (Last 24 hours) at 08/18/2020 1343 Last data filed at 08/18/2020 0805 Gross per 24 hour  Intake --  Output 1000 ml  Net -1000 ml     Physical Examination:  General-appears in no acute distress Heart-S1-S2, regular, no murmur auscultated Lungs-clear to auscultation bilaterally, no wheezing or crackles auscultated Abdomen-soft, nontender, no organomegaly Extremities-no edema in the lower extremities Neuro-alert, oriented x3, no focal deficit noted  Status is: Inpatient  Dispo: The patient is from: [place]              Anticipated d/c is to: Skilled nursing facility              Anticipated d/c date is: 08/20/2020              Patient currently medically stable for discharge  Barrier to discharge-awaiting bed at nursing facility.      Data Reviewed:   No results found for this or any previous visit (from the past 240 hour(s)).       Oswald Hillock   [facility] If 7PM-7AM, please contact night-coverage at [email or web address], Office  [phone]   08/18/2020, 1:43 PM  LOS: 13 days

## 2020-08-18 NOTE — TOC Progression Note (Signed)
Transition of Care [facility]) - Progression Note    Patient Details  Name: Alexander Beard MRN: [id number] Date of Birth: 1956/05/20  [facility] CM/SW Contact  Shelbie Hutching, RN Phone Number: 08/18/2020, 4:15 PM  Clinical Narrative:    Bed search is still under way, no bed offers at this time.  Referral faxed to [facility], they said they would review.  Referral also faxed to [facility] in [facility].  RNCM will reach out to more facilities tomorrow.   Expected Discharge Plan: Group Home Barriers to Discharge: Continued Medical Work up  Expected Discharge Plan and Services Expected Discharge Plan: Group Home   Discharge Planning Services: CM Consult   Living arrangements for the past 2 months: Group Home                                       Social Determinants of Health (SDOH) Interventions    Readmission Risk Interventions Readmission Risk Prevention Plan 08/07/2020  Transportation Screening Complete  Medication Review (RN Care Manager) Complete  PCP or Specialist appointment within 3-5 days of discharge Complete  HRI or Home Care Consult Complete  SW Recovery Care/Counseling Consult Complete  Palliative Care Screening Not [place] Complete  Some recent data might be hidden

## 2020-08-19 ENCOUNTER — Encounter: Payer: Self-pay | Admitting: Family Medicine

## 2020-08-19 DIAGNOSIS — I10 Essential (primary) hypertension: Secondary | ICD-10-CM | POA: Diagnosis not present

## 2020-08-19 DIAGNOSIS — E119 Type 2 diabetes mellitus without complications: Secondary | ICD-10-CM | POA: Diagnosis not present

## 2020-08-19 DIAGNOSIS — F250 Schizoaffective disorder, bipolar type: Secondary | ICD-10-CM | POA: Diagnosis not present

## 2020-08-19 LAB — BASIC METABOLIC PANEL
Anion gap: 10 (ref 5–15)
BUN: 14 mg/dL (ref 8–23)
CO2: 28 mmol/L (ref 22–32)
Calcium: 9.1 mg/dL (ref 8.9–10.3)
Chloride: 103 mmol/L (ref 98–111)
Creatinine, Ser: 0.85 mg/dL (ref 0.61–1.24)
GFR, Estimated: >60 mL/min (ref >60)
Glucose, Bld: 114 mg/dL — ABNORMAL HIGH (ref 70–99)
Potassium: 4.5 mmol/L (ref 3.5–5.1)
Sodium: 141 mmol/L (ref 135–145)

## 2020-08-19 LAB — GLUCOSE, CAPILLARY
Glucose-Capillary: 103 mg/dL — ABNORMAL HIGH (ref 70–99)
Glucose-Capillary: 144 mg/dL — ABNORMAL HIGH (ref 70–99)
Glucose-Capillary: 154 mg/dL — ABNORMAL HIGH (ref 70–99)
Glucose-Capillary: 117 mg/dL — ABNORMAL HIGH (ref 70–99)

## 2020-08-19 NOTE — Progress Notes (Signed)
PROGRESS NOTE    Alexander Beard  OZD:[id number] DOB: [birth year] DOA: 08/04/2020 PCP: Remi Haggard, FNP  Assessment & Plan:   Principal Problem:   Pneumonia due to COVID-19 virus Active Problems:   Schizoaffective disorder, bipolar type ([place])   Hypertension   Controlled type 2 diabetes mellitus without complication ([place])   Encephalopathy acute   Pneumonia  Acute hypoxic respiratory failure: secondary to COVID-19 pneumonia. Continue on steroid taper. Completed remdesivir course.  Weaned off of supplemental oxygen. Resolved  Acute metabolic encephalopathy: likely overmdicated, dose has been reduced. Returned to baseline. Resolved  Schizoaffective disorder: longtime group home resident.  Continue on seroquel, clonazepam, lamictal, zoloft.   Hypokalemia: WNL today.  Thrombocytopenia: resolved  DM2: well controlled. Continue on SSI w/ accuchecks   HTN: WNL currently.      DVT prophylaxis: lovenox  Code Status: full  Family Communication:  Disposition Plan: PT/OT recs SNF, waiting on SNF placement   Level of care: Med-Surg   Status is: Inpatient  Remains inpatient appropriate because:Unsafe d/c plan   Dispo:  Patient From: Group Home  Planned Disposition: [place]  Expected discharge date: 08/24/2020  Medically stable for discharge: Yes          Consultants:      Procedures:   Antimicrobials:   Subjective: Pt denies any complaints   Objective: Vitals:   08/18/20 1915 08/18/20 2304 08/18/20 2333 08/19/20 0407  BP: 108/79 108/79 109/71 110/78  Pulse: 94 94 74 75  Resp: 18 18 14 18   Temp: 98.3 F (36.8 C) 98.3 F (36.8 C) 98.3 F (36.8 C) 97.9 F (36.6 C)  TempSrc:  Oral    SpO2: 97%  92% 94%  Weight:  104 kg    Height:  5\' 8"  (1.727 m)      Intake/Output Summary (Last 24 hours) at 08/19/2020 0739 Last data filed at 08/18/2020 0805 Gross per 24 hour  Intake -  Output 700 ml  Net -700 ml   Filed Weights   08/18/20  2304  Weight: 104 kg    Examination:  General exam: Appears calm and comfortable  Respiratory system: Clear to auscultation. Respiratory effort normal. Cardiovascular system: S1 & S2 +. No rubs, gallops or clicks.  Gastrointestinal system: Abdomen is nondistended, soft and non-tender. Normal bowel sounds heard. Central nervous system: Alert and awake. Moves all 4 extremities  Psychiatry: Judgement and insight appear normal. Flat mood and affect     Data Reviewed: I have personally reviewed following labs and imaging studies  CBC: Recent Labs  Lab 08/14/20 0445 08/18/20 0537  WBC 6.7 5.3  HGB 12.1* 11.5*  HCT 36.8* 35.7*  MCV 92.7 94.2  PLT 261 259   Basic Metabolic Panel: Recent Labs  Lab 08/14/20 0445 08/15/20 0709  NA 140 142  K 3.0* 4.3  CL 105 106  CO2 27 27  GLUCOSE 188* 107*  BUN 18 14  CREATININE 0.74 0.60*  CALCIUM 8.8* 8.9   GFR: Estimated Creatinine Clearance: 109 mL/min (A) (by C-G formula based on SCr of 0.6 mg/dL (L)). Liver Function Tests: No results for input(s): AST, ALT, ALKPHOS, BILITOT, PROT, ALBUMIN in the last 168 hours. No results for input(s): LIPASE, AMYLASE in the last 168 hours. No results for input(s): AMMONIA in the last 168 hours. Coagulation Profile: No results for input(s): INR, PROTIME in the last 168 hours. Cardiac Enzymes: No results for input(s): CKTOTAL, CKMB, CKMBINDEX, TROPONINI in the last 168 hours. BNP (last 3 results) No results  for input(s): PROBNP in the last 8760 hours. HbA1C: No results for input(s): HGBA1C in the last 72 hours. CBG: Recent Labs  Lab 08/17/20 2120 08/18/20 0743 08/18/20 1145 08/18/20 1541 08/18/20 2100  GLUCAP 160* 100* 253* 90 109*   Lipid Profile: No results for input(s): CHOL, HDL, LDLCALC, TRIG, CHOLHDL, LDLDIRECT in the last 72 hours. Thyroid Function Tests: No results for input(s): TSH, T4TOTAL, FREET4, T3FREE, THYROIDAB in the last 72 hours. Anemia Panel: No results for  input(s): VITAMINB12, FOLATE, FERRITIN, TIBC, IRON, RETICCTPCT in the last 72 hours. Sepsis Labs: No results for input(s): PROCALCITON, LATICACIDVEN in the last 168 hours.  No results found for this or any previous visit (from the past 240 hour(s)).       Radiology Studies: No results found.      Scheduled Meds: . aspirin EC  81 mg Oral Daily  . clonazePAM  1 mg Oral BID  . divalproex  1,000 mg Oral QHS  . enoxaparin (LOVENOX) injection  40 mg Subcutaneous Q24H  . insulin aspart  0-5 Units Subcutaneous QHS  . insulin aspart  0-9 Units Subcutaneous TID WC  . lamoTRIgine  150 mg Oral QHS  . linagliptin  5 mg Oral Daily  . oxybutynin  10 mg Oral BID  . pantoprazole  40 mg Oral Daily  . QUEtiapine  100 mg Oral TID  . QUEtiapine  300 mg Oral QHS  . sertraline  50 mg Oral Daily  . simethicone  80 mg Oral TID with meals   Continuous Infusions:   LOS: 14 days    Time spent: 30 mins    Wyvonnia Dusky, MD [facility] Pager [phone]  If 7PM-7AM, please contact night-coverage 08/19/2020, 7:39 AM

## 2020-08-19 NOTE — TOC Progression Note (Signed)
Transition of Care [facility]) - Progression Note    Patient Details  Name: Alexander Beard MRN: [id number] Date of Birth: [birth year]  Transition of Care [facility]) CM/SW Contact  Shelbie Hutching, RN Phone Number: 08/19/2020, 1:17 PM  Clinical Narrative:    RNCM updated patient's brother, [name], this morning via phone.  TOC continues to search for SNF placement.  RNCM left a message for community case manager [name] [phone].   [facility] offered a bed in the hub but once RNCM reached out they resended bed offer as they cannot meet patient needs.   RNCM called [facility]- they are not accepting admissions at this time but are taking referrals.  Admission's office reports that it would be 6 to 12 months before they could accept patient.  RNCM will work on filling out the application for [employer].     Expected Discharge Plan: Group Home Barriers to Discharge: Continued Medical Work up  Expected Discharge Plan and Services Expected Discharge Plan: Group Home   Discharge Planning Services: CM Consult   Living arrangements for the past 2 months: Group Home                                       Social Determinants of Health (SDOH) Interventions    Readmission Risk Interventions Readmission Risk Prevention Plan 08/07/2020  Transportation Screening Complete  Medication Review (RN Care Manager) Complete  PCP or Specialist appointment within 3-5 days of discharge Complete  HRI or Home Care Consult Complete  SW Recovery Care/Counseling Consult Complete  Palliative Care Screening Not [place] Complete  Some recent data might be hidden

## 2020-08-19 NOTE — Progress Notes (Signed)
Physical Therapy Treatment Patient Details Name: Alexander Beard MRN: [id number] DOB: 1955/08/23 Today's Date: 08/19/2020    History of Present Illness 65 y.o. male with medical history significant for schizoaffective disorder, bipolar type, autism spectrum disorder, COPD, T2DM, HTN, HLD who presents to the ED for evaluation of shortness of breath. Covid-19+ on 08/04/20.     Per brother, patient recently had a prolonged behavioral health hospital stay from Aug - Dec 2021 before discharged to group care home in [place] which is a new environment for him.  Brother states that he has noted significant "grogginess" over the last few weeks since medication schedule changes (4x/day to 2x/day).  This has coincided with several falls that have not resulted in any significant injuries.    PT Comments    Pt resting in bed upon PT arrival working on his artwork; agreeable to PT session.  Pt perseverating regarding his COVID diagnosis and also regarding new COVID variants (nurse notified) requiring frequent redirection during session.  SBA with bed mobility; min to mod assist with transfers (pt with posterior lean in standing); and min to mod assist to ambulate 12 feet with RW (close chair follow for safety).  Pt with significant amount of noted gait impairments requiring consistent cueing and assist for safety (see below for details).  Will continue to focus on strengthening and progressive functional mobility during hospitalization.    Follow Up Recommendations  SNF     Equipment Recommendations  Rolling walker with 5" wheels;3in1 (PT)    Recommendations for Other Services       Precautions / Restrictions Precautions Precautions: Fall Restrictions Weight Bearing Restrictions: No    Mobility  Bed Mobility Overal bed mobility: Needs Assistance Bed Mobility: Supine to Sit     Supine to sit: Supervision     General bed mobility comments: minimal cueing for technique; SBA for  safety  Transfers Overall transfer level: Needs assistance Equipment used: Rolling walker (2 wheeled) Transfers: Sit to/from Stand Sit to Stand: Min assist;Mod assist (x1 trial from bed and x1 trial from recliner)         General transfer comment: vc's for UE placement and to shift weight forward once standing d/t posterior lean  Ambulation/Gait Ambulation/Gait assistance: Min assist;Mod assist;+2 safety/equipment (close chair follow for safety) Gait Distance (Feet): 12 Feet Assistive device: Rolling walker (2 wheeled)   Gait velocity: decreased   General Gait Details: pt requiring consistent vc's to keep RW on ground (pt picking RW up to advance walker but requiring assist for balance); intermittent posterior lean in standing/walking noted; pt stomping feet at times; ataxic B LE advancement noted at times; decreased B LE step length/foot clearance/heelstrike; pt flexing B knees at times in standing requiring cueing for upright posture; vc's and assist required to keep walker closer to pt; vc's required for safe gait technique   Stairs             Wheelchair Mobility    Modified Rankin (Stroke Patients Only)       Balance Overall balance assessment: Needs assistance Sitting-balance support: No upper extremity supported;Feet supported Sitting balance-Leahy Scale: Good Sitting balance - Comments: steady sitting reaching within BOS   Standing balance support: Bilateral upper extremity supported Standing balance-Leahy Scale: Poor Standing balance comment: pt with intermittent posterior lean in standing requiring assist for balance and cueing to correct  Cognition Arousal/Alertness: Awake/alert Behavior During Therapy: Restless;Impulsive Overall Cognitive Status: No family/caregiver present to determine baseline cognitive functioning Area of Impairment: Orientation;Attention;Memory;Following  commands;Safety/judgement;Awareness;Problem solving                 Orientation Level: Disoriented to;Time;Situation Current Attention Level: Focused Memory: Decreased recall of precautions;Decreased short-term memory Following Commands: Follows one step commands consistently;Follows one step commands inconsistently Safety/Judgement: Decreased awareness of safety;Decreased awareness of deficits Awareness: Intellectual Problem Solving: Slow processing;Requires tactile cues;Requires verbal cues        Exercises      General Comments General comments (skin integrity, edema, etc.): Pt received and left with art supplies (crayons, coloring pencils, and coloring books) per pt request.  Nursing cleared pt for participation in physical therapy and reports pt off airborne isolation today (for COVID diagnosis).  Pt agreeable to PT session.      Pertinent Vitals/Pain Pain Assessment: No/denies pain  Vitals (HR and O2 on room air) stable and WFL throughout treatment session.    Home Living                      Prior Function            PT Goals (current goals can now be found in the care plan section) Acute Rehab PT Goals Patient Stated Goal: to discharge from hospital PT Goal Formulation: With patient Time For Goal Achievement: 08/21/20 Potential to Achieve Goals: Fair Progress towards PT goals: Progressing toward goals    Frequency    Min 2X/week      PT Plan Current plan remains appropriate    Co-evaluation              AM-PAC PT "6 Clicks" Mobility   Outcome Measure  Help needed turning from your back to your side while in a flat bed without using bedrails?: None Help needed moving from lying on your back to sitting on the side of a flat bed without using bedrails?: A Little Help needed moving to and from a bed to a chair (including a wheelchair)?: A Little Help needed standing up from a chair using your arms (e.g., wheelchair or bedside chair)?: A  Little Help needed to walk in hospital room?: A Lot Help needed climbing 3-5 steps with a railing? : Total 6 Click Score: 16    End of Session Equipment Utilized During Treatment: Gait belt Activity Tolerance: Patient tolerated treatment well Patient left: in chair;with call bell/phone within reach;with chair alarm set;Other (comment) (B heels floating via pillow support) Nurse Communication: Mobility status;Precautions PT Visit Diagnosis: Muscle weakness (generalized) (M62.81);Difficulty in walking, not elsewhere classified (R26.2)     Time: 2979-8921 PT Time Calculation (min) (ACUTE ONLY): 47 min  Charges:  $Gait Training: 8-22 mins $Therapeutic Exercise: 8-22 mins $Therapeutic Activity: 8-22 mins                    Leitha Bleak, PT 08/19/20, 3:27 PM

## 2020-08-20 DIAGNOSIS — I10 Essential (primary) hypertension: Secondary | ICD-10-CM | POA: Diagnosis not present

## 2020-08-20 DIAGNOSIS — E119 Type 2 diabetes mellitus without complications: Secondary | ICD-10-CM | POA: Diagnosis not present

## 2020-08-20 DIAGNOSIS — F250 Schizoaffective disorder, bipolar type: Secondary | ICD-10-CM | POA: Diagnosis not present

## 2020-08-20 LAB — GLUCOSE, CAPILLARY
Glucose-Capillary: 158 mg/dL — ABNORMAL HIGH (ref 70–99)
Glucose-Capillary: 103 mg/dL — ABNORMAL HIGH (ref 70–99)
Glucose-Capillary: 128 mg/dL — ABNORMAL HIGH (ref 70–99)
Glucose-Capillary: 98 mg/dL (ref 70–99)

## 2020-08-20 NOTE — TOC Progression Note (Signed)
Transition of Care [facility]) - Progression Note    Patient Details  Name: Alexander Beard MRN: [id number] Date of Birth: 07-27-1955  Transition [facility]) CM/SW Contact  Shelbie Hutching, RN Phone Number: 08/20/2020, 9:41 AM  Clinical Narrative:    [facility] team working on SNF for LTC.  [name] is not currently accepting admissions but they did say we could try back next week.  RNCM reached out to the [facility] and Ebony Hail will review and send to their clinical manager for review.     Expected Discharge Plan: Group Home Barriers to Discharge: Continued Medical Work up  Expected Discharge Plan and Services Expected Discharge Plan: Group Home   Discharge Planning Services: CM Consult   Living arrangements for the past 2 months: Group Home                                       Social Determinants of Health (SDOH) Interventions    Readmission Risk Interventions Readmission Risk Prevention Plan 08/07/2020  Transportation Screening Complete  Medication Review (RN Care Manager) Complete  PCP or Specialist appointment within 3-5 days of discharge Complete  HRI or Home Care Consult Complete  SW Recovery Care/Counseling Consult Complete  Palliative Care Screening Not [place] Complete  Some recent data might be hidden

## 2020-08-20 NOTE — TOC Progression Note (Signed)
Transition of Care [facility]) - Progression Note    Patient Details  Name: Alexander Beard MRN: [id number] Date of Birth: 04-09-1956  Transition of Care [facility]) CM/SW [place], RN Phone Number: 08/20/2020, 11:40 AM  Clinical Narrative:    Spoke to Mrs. Ford @ [facility] regarding possible admission.    Expected Discharge Plan: Group Home Barriers to Discharge: Continued Medical Work up  Expected Discharge Plan and Services Expected Discharge Plan: Group Home   Discharge Planning Services: CM Consult   Living arrangements for the past 2 months: Group Home                                       Social Determinants of Health (SDOH) Interventions    Readmission Risk Interventions Readmission Risk Prevention Plan 08/07/2020  Transportation Screening Complete  Medication Review (RN Care Manager) Complete  PCP or Specialist appointment within 3-5 days of discharge Complete  HRI or Home Care Consult Complete  SW Recovery Care/Counseling Consult Complete  Palliative Care Screening Not [place] Complete  Some recent data might be hidden

## 2020-08-20 NOTE — Evaluation (Signed)
Occupational Therapy Re-Evaluation Patient Details Name: Alexander Beard MRN: [id number] DOB: 05-20-1956 Today's Date: 08/20/2020    History of Present Illness 65 y.o. male with medical history significant for schizoaffective disorder, bipolar type, autism spectrum disorder, COPD, T2DM, HTN, HLD who presents to the ED for evaluation of shortness of breath. Covid-19+ on 08/04/20.     Per brother, patient recently had a prolonged behavioral health hospital stay from Aug - Dec 2021 before discharged to group care home in [place] which is a new environment for him.  Brother states that he has noted significant "grogginess" over the last few weeks since medication schedule changes (4x/day to 2x/day).  This has coincided with several falls that have not resulted in any significant injuries.   Clinical Impression   Pt seen today for OT re-eval and tx. Pt received in recliner, agreeable to session. Pt noted with spillage on hospital gown from lunch and pt demonstrating difficulty to bring food and drink to his mouth 2/2 BUE tremors/shaking. MIN A for UB dressing task and MIN A for bringing food/drink to mouth 2/2 shaking. Pt attempting to engage in art task but demo's poor positioning in recliner. With assist and pillows to achieve a more upright posture, pt noted to have improved access to items and decreased difficulty managing paper, however did spill art supplies container 2/2 shaking. RN notified. Pt continues to benefit from skilled OT services. Continue to recommend additional therapy upon discharge.     Follow Up Recommendations  SNF    Equipment Recommendations  3 in 1 bedside commode    Recommendations for Other Services       Precautions / Restrictions Precautions Precautions: Fall Restrictions Weight Bearing Restrictions: No      Mobility Bed Mobility               General bed mobility comments: deferred, up in recliner at start and end of session    Transfers Overall  transfer level: Needs assistance Equipment used: Rolling walker (2 wheeled) Transfers: Sit to/from Stand Sit to Stand: Min assist              Balance Overall balance assessment: Needs assistance Sitting-balance support: No upper extremity supported;Feet supported Sitting balance-Leahy Scale: Good Sitting balance - Comments: steady sitting reaching within BOS   Standing balance support: Bilateral upper extremity supported Standing balance-Leahy Scale: Poor Standing balance comment: requires BUE support                           ADL either performed or assessed with clinical judgement   ADL   Eating/Feeding: Minimal assistance;Sitting Eating/Feeding Details (indicate cue type and reason): Min A to stabilize cup 2/2 tremors             Upper Body Dressing : Minimal assistance;Sitting                           Vision Patient Visual Report: No change from baseline       Perception     Praxis      Pertinent Vitals/Pain Pain Assessment: No/denies pain     Hand Dominance     Extremity/Trunk Assessment Upper Extremity Assessment Upper Extremity Assessment: Generalized weakness (BUE w/ mild tremors today making it difficult to eat, drink, engage in his art)   Lower Extremity Assessment Lower Extremity Assessment: Generalized weakness       Communication     Cognition  Arousal/Alertness: Awake/alert Behavior During Therapy: Restless Overall Cognitive Status: No family/caregiver present to determine baseline cognitive functioning                                 General Comments: alert and oriented x3 (date), follows commands with PRN cues   General Comments       Exercises Other Exercises Other Exercises: seated dressing and tray table mgt for art supplies and clean up after lunch   Shoulder Instructions      Home Living Family/patient expects to be discharged to:: Group home                                  Additional Comments: Difficult to obtain PLOF d/t pt's cognition. Per chart review, pt was living in a group home      Prior Functioning/Environment Level of Independence: Needs assistance  Gait / Transfers Assistance Needed: Per brother, pt was not using any DME for functional mobility prior to his Aug-Dec hospitalization, and is unsure how pt has been moving since last admission. Pt's brother reports that pt has LE neuropathy at baseline, which has contributed to his hx of falls. Reports that he has fallen much more frequently since living in the group home ADL's / Homemaking Assistance Needed: Pt's brother reports that he needed assistance with donning compression socks prior to his Aug-Dec hospitalization, and is unsure how much assistance he was receiving for dressing in group home. Per pt's brother, pt needed MIN-MOD A for bathing to ensure thoroughness            OT Problem List: Decreased strength;Decreased activity tolerance;Impaired balance (sitting and/or standing);Decreased cognition;Decreased safety awareness;Decreased knowledge of precautions;Decreased coordination      OT Treatment/Interventions: Self-care/ADL training;Therapeutic exercise;Energy conservation;DME and/or AE instruction;Therapeutic activities;Patient/family education;Balance training    OT Goals(Current goals can be found in the care plan section) Acute Rehab OT Goals Patient Stated Goal: to discharge from hospital OT Goal Formulation: With patient Time For Goal Achievement: 09/03/20 Potential to Achieve Goals: Good ADL Goals Pt Will Perform Upper Body Dressing: with supervision;sitting Pt Will Transfer to Toilet: with min guard assist;ambulating;bedside commode (LRAD)  OT Frequency: Min 2X/week   Barriers to D/C:            Co-evaluation              AM-PAC OT "6 Clicks" Daily Activity     Outcome Measure Help from another person eating meals?: A Little Help from another person taking  care of personal grooming?: A Little Help from another person toileting, which includes using toliet, bedpan, or urinal?: A Lot Help from another person bathing (including washing, rinsing, drying)?: A Lot Help from another person to put on and taking off regular upper body clothing?: A Little Help from another person to put on and taking off regular lower body clothing?: A Lot 6 Click Score: 15   End of Session Nurse Communication: Other (comment) (BUE tremors)  Activity Tolerance: Patient tolerated treatment well Patient left: in chair;with call bell/phone within reach;with chair alarm set  OT Visit Diagnosis: Unsteadiness on feet (R26.81);History of falling (Z91.81)                Time: 1436-1510 OT Time Calculation (min): 34 min Charges:  OT General Charges $OT Visit: 1 Visit OT Evaluation $OT Re-eval: 1 Re-eval OT Treatments $  Self Care/Home Management : 23-37 mins  Hanley Hays, MPH, MS, OTR/L ascom [phone] 08/20/20, 3:54 PM

## 2020-08-20 NOTE — Progress Notes (Signed)
PROGRESS NOTE    Alexander Beard  GXQ:[id number] DOB: 1956-03-12 DOA: 08/04/2020 PCP: Remi Haggard, FNP  Assessment & Plan:   Principal Problem:   Pneumonia due to COVID-19 virus Active Problems:   Schizoaffective disorder, bipolar type ([place])   Hypertension   Controlled type 2 diabetes mellitus without complication ([place])   Encephalopathy acute   Pneumonia  Acute hypoxic respiratory failure: secondary to COVID-19 pneumonia. Continue on steroid taper. Completed remdesivir course.  Weaned off of supplemental oxygen. Resolved  Acute metabolic encephalopathy: likely overmedicated, dose has been reduced. Mental status waxes and wanes   Schizoaffective disorder: longtime group home resident.  Continue on zoloft, seroquel, clonazepam, & lamictal.   Hypokalemia: will monitor periodically   Thrombocytopenia: resolved  DM2: well controled. Continue on SSI w/ accuchecks      DVT prophylaxis: lovenox  Code Status: full  Family Communication:  Disposition Plan: PT/OT recs SNF, waiting on SNF placement   Level of care: Med-Surg   Status is: Inpatient  Remains inpatient appropriate because:Unsafe d/c plan   Dispo:  Patient From: Group Home  Planned Disposition: [place]  Expected discharge date: 08/24/2020  Medically stable for discharge: Yes          Consultants:      Procedures:   Antimicrobials:   Subjective: Pt c/o fatigue. Pt denies shortness of breath and no longer requiring supplemental oxygen    Objective: Vitals:   08/19/20 1937 08/19/20 2353 08/20/20 0340 08/20/20 0756  BP: 133/85 112/74 109/76 122/82  Pulse: 76 66 70 72  Resp: 16 18 18 18   Temp: (!) 97.5 F (36.4 C) 97.9 F (36.6 C) 97.6 F (36.4 C) 98.3 F (36.8 C)  TempSrc:      SpO2: 99% 98% 98% 94%  Weight:      Height:        Intake/Output Summary (Last 24 hours) at 08/20/2020 1320 Last data filed at 08/19/2020 2038 Gross per 24 hour  Intake --  Output 250 ml   Net -250 ml   Filed Weights   08/18/20 2304  Weight: 104 kg    Examination:  General exam:  Appears comfortable  Respiratory system: clear breath sounds b/l. No wheezes, rales or rhonchi  Cardiovascular system: S1/S2+. No clicks or gallops  Gastrointestinal system: Abd is soft, NT, & hypoactive bowel sounds  Central nervous system: Alert and awake. Moves all 4 extremities  Psychiatry: Judgement and insight appear abnormal. Flat mood and affect     Data Reviewed: I have personally reviewed following labs and imaging studies  CBC: Recent Labs  Lab 08/14/20 0445 08/18/20 0537  WBC 6.7 5.3  HGB 12.1* 11.5*  HCT 36.8* 35.7*  MCV 92.7 94.2  PLT 261 144   Basic Metabolic Panel: Recent Labs  Lab 08/14/20 0445 08/15/20 0709 08/19/20 0814  NA 140 142 141  K 3.0* 4.3 4.5  CL 105 106 103  CO2 27 27 28   GLUCOSE 188* 107* 114*  BUN 18 14 14   CREATININE 0.74 0.60* 0.85  CALCIUM 8.8* 8.9 9.1   GFR: Estimated Creatinine Clearance: 102.6 mL/min (by C-G formula based on SCr of 0.85 mg/dL). Liver Function Tests: No results for input(s): AST, ALT, ALKPHOS, BILITOT, PROT, ALBUMIN in the last 168 hours. No results for input(s): LIPASE, AMYLASE in the last 168 hours. No results for input(s): AMMONIA in the last 168 hours. Coagulation Profile: No results for input(s): INR, PROTIME in the last 168 hours. Cardiac Enzymes: No results for input(s): CKTOTAL,  CKMB, CKMBINDEX, TROPONINI in the last 168 hours. BNP (last 3 results) No results for input(s): PROBNP in the last 8760 hours. HbA1C: No results for input(s): HGBA1C in the last 72 hours. CBG: Recent Labs  Lab 08/19/20 1204 08/19/20 1556 08/19/20 2028 08/20/20 0759 08/20/20 1204  GLUCAP 144* 154* 117* 158* 103*   Lipid Profile: No results for input(s): CHOL, HDL, LDLCALC, TRIG, CHOLHDL, LDLDIRECT in the last 72 hours. Thyroid Function Tests: No results for input(s): TSH, T4TOTAL, FREET4, T3FREE, THYROIDAB in the last  72 hours. Anemia Panel: No results for input(s): VITAMINB12, FOLATE, FERRITIN, TIBC, IRON, RETICCTPCT in the last 72 hours. Sepsis Labs: No results for input(s): PROCALCITON, LATICACIDVEN in the last 168 hours.  No results found for this or any previous visit (from the past 240 hour(s)).       Radiology Studies: No results found.      Scheduled Meds: . aspirin EC  81 mg Oral Daily  . clonazePAM  1 mg Oral BID  . divalproex  1,000 mg Oral QHS  . enoxaparin (LOVENOX) injection  40 mg Subcutaneous Q24H  . insulin aspart  0-5 Units Subcutaneous QHS  . insulin aspart  0-9 Units Subcutaneous TID WC  . lamoTRIgine  150 mg Oral QHS  . linagliptin  5 mg Oral Daily  . oxybutynin  10 mg Oral BID  . pantoprazole  40 mg Oral Daily  . QUEtiapine  100 mg Oral TID  . QUEtiapine  300 mg Oral QHS  . sertraline  50 mg Oral Daily  . simethicone  80 mg Oral TID with meals   Continuous Infusions:   LOS: 15 days    Time spent: 33 mins    Wyvonnia Dusky, MD [facility] Pager [phone]  If 7PM-7AM, please contact night-coverage 08/20/2020, 1:20 PM

## 2020-08-21 DIAGNOSIS — E119 Type 2 diabetes mellitus without complications: Secondary | ICD-10-CM | POA: Diagnosis not present

## 2020-08-21 DIAGNOSIS — I10 Essential (primary) hypertension: Secondary | ICD-10-CM | POA: Diagnosis not present

## 2020-08-21 DIAGNOSIS — F250 Schizoaffective disorder, bipolar type: Secondary | ICD-10-CM | POA: Diagnosis not present

## 2020-08-21 LAB — GLUCOSE, CAPILLARY
Glucose-Capillary: 115 mg/dL — ABNORMAL HIGH (ref 70–99)
Glucose-Capillary: 176 mg/dL — ABNORMAL HIGH (ref 70–99)
Glucose-Capillary: 126 mg/dL — ABNORMAL HIGH (ref 70–99)
Glucose-Capillary: 105 mg/dL — ABNORMAL HIGH (ref 70–99)

## 2020-08-21 LAB — CBC
HCT: 36.3 % — ABNORMAL LOW (ref 39.0–52.0)
Hemoglobin: 11.8 g/dL — ABNORMAL LOW (ref 13.0–17.0)
MCH: 30.8 pg (ref 26.0–34.0)
MCHC: 32.5 g/dL (ref 30.0–36.0)
MCV: 94.8 fL (ref 80.0–100.0)
Platelets: 163 10*3/uL (ref 150–400)
RBC: 3.83 MIL/uL — ABNORMAL LOW (ref 4.22–5.81)
RDW: 14.9 % (ref 11.5–15.5)
WBC: 4.3 10*3/uL (ref 4.0–10.5)
nRBC: 0 % (ref 0.0–0.2)

## 2020-08-21 NOTE — Progress Notes (Signed)
PROGRESS NOTE    Alexander Beard  [id number]:[id number] DOB: [birth year] DOA: 08/04/2020 PCP: Remi Haggard, FNP  Assessment & Plan:   Principal Problem:   Pneumonia due to COVID-19 virus Active Problems:   Schizoaffective disorder, bipolar type ([place])   Hypertension   Controlled type 2 diabetes mellitus without complication ([place])   Encephalopathy acute   Pneumonia  Acute hypoxic respiratory failure: secondary to COVID-19 pneumonia. Continue on steroid taper. Completed remdesivir course.  Weaned off of supplemental oxygen. Resolved  Acute metabolic encephalopathy: likely overmedicated, dose has been reduced. Mental status waxes and wanes   Schizoaffective disorder: continue on lamictal, zoloft, seroquel & clonazepam. Long time group home resident.     Hypokalemia: will monitor periodically   Thrombocytopenia: resolved  DM2: well controlled. Continue on SSI w/ accuchecks      DVT prophylaxis: lovenox  Code Status: full  Family Communication:  Disposition Plan: PT/OT recs SNF, waiting on SNF placement   Level of care: Med-Surg   Status is: Inpatient  Remains inpatient appropriate because:Unsafe d/c plan   Dispo:  Patient From: Group Home  Planned Disposition: [place]  Expected discharge date: 08/24/2020  Medically stable for discharge: Yes          Consultants:      Procedures:   Antimicrobials:   Subjective: Pt c/o malaise   Objective: Vitals:   08/20/20 1639 08/20/20 1952 08/21/20 0100 08/21/20 0358  BP: 114/66 133/75 112/76 101/76  Pulse: 74 66 74 78  Resp: 16 16 18 18   Temp: 98.4 F (36.9 C) 98.8 F (37.1 C) 98.6 F (37 C) 97.6 F (36.4 C)  TempSrc:      SpO2: 97% 99% 96% 98%  Weight:      Height:        Intake/Output Summary (Last 24 hours) at 08/21/2020 0741 Last data filed at 08/21/2020 0100 Gross per 24 hour  Intake -  Output 750 ml  Net -750 ml   Filed Weights   08/18/20 2304  Weight: 104 kg     Examination:  General exam:  Appears calm & comfortable   Respiratory system: clear breath sounds b/l  Cardiovascular system: S1 & S2 +. No rubs or gallops  Gastrointestinal system: Abd is soft, NT, ND, & hypoactive bowel sounds   Central nervous system: Alert and awake. Moves all 4 extremities  Psychiatry: Judgement and insight appear abnormal. Flat mood and affect     Data Reviewed: I have personally reviewed following labs and imaging studies  CBC: Recent Labs  Lab 08/18/20 0537 08/21/20 0651  WBC 5.3 4.3  HGB 11.5* 11.8*  HCT 35.7* 36.3*  MCV 94.2 94.8  PLT 215 742   Basic Metabolic Panel: Recent Labs  Lab 08/15/20 0709 08/19/20 0814  NA 142 141  K 4.3 4.5  CL 106 103  CO2 27 28  GLUCOSE 107* 114*  BUN 14 14  CREATININE 0.60* 0.85  CALCIUM 8.9 9.1   GFR: Estimated Creatinine Clearance: 102.6 mL/min (by C-G formula based on SCr of 0.85 mg/dL). Liver Function Tests: No results for input(s): AST, ALT, ALKPHOS, BILITOT, PROT, ALBUMIN in the last 168 hours. No results for input(s): LIPASE, AMYLASE in the last 168 hours. No results for input(s): AMMONIA in the last 168 hours. Coagulation Profile: No results for input(s): INR, PROTIME in the last 168 hours. Cardiac Enzymes: No results for input(s): CKTOTAL, CKMB, CKMBINDEX, TROPONINI in the last 168 hours. BNP (last 3 results) No results for input(s): PROBNP in  the last 8760 hours. HbA1C: No results for input(s): HGBA1C in the last 72 hours. CBG: Recent Labs  Lab 08/20/20 0759 08/20/20 1204 08/20/20 1642 08/20/20 2112 08/21/20 0739  GLUCAP 158* 103* 128* 98 115*   Lipid Profile: No results for input(s): CHOL, HDL, LDLCALC, TRIG, CHOLHDL, LDLDIRECT in the last 72 hours. Thyroid Function Tests: No results for input(s): TSH, T4TOTAL, FREET4, T3FREE, THYROIDAB in the last 72 hours. Anemia Panel: No results for input(s): VITAMINB12, FOLATE, FERRITIN, TIBC, IRON, RETICCTPCT in the last 72 hours. Sepsis  Labs: No results for input(s): PROCALCITON, LATICACIDVEN in the last 168 hours.  No results found for this or any previous visit (from the past 240 hour(s)).       Radiology Studies: No results found.      Scheduled Meds: . aspirin EC  81 mg Oral Daily  . clonazePAM  1 mg Oral BID  . divalproex  1,000 mg Oral QHS  . enoxaparin (LOVENOX) injection  40 mg Subcutaneous Q24H  . insulin aspart  0-5 Units Subcutaneous QHS  . insulin aspart  0-9 Units Subcutaneous TID WC  . lamoTRIgine  150 mg Oral QHS  . linagliptin  5 mg Oral Daily  . oxybutynin  10 mg Oral BID  . pantoprazole  40 mg Oral Daily  . QUEtiapine  100 mg Oral TID  . QUEtiapine  300 mg Oral QHS  . sertraline  50 mg Oral Daily  . simethicone  80 mg Oral TID with meals   Continuous Infusions:   LOS: 16 days    Time spent: 25 mins    Wyvonnia Dusky, MD [facility] Pager [phone]  If 7PM-7AM, please contact night-coverage 08/21/2020, 7:41 AM

## 2020-08-21 NOTE — Evaluation (Signed)
Physical Therapy Re-Evaluation Patient Details Name: Alexander Beard MRN: [id number] DOB: March 24, 1956 Today's Date: 08/21/2020   History of Present Illness  65 y.o. male with medical history significant for schizoaffective disorder, bipolar type, autism spectrum disorder, COPD, T2DM, HTN, HLD who presents to the ED for evaluation of shortness of breath. Covid-19+ on 08/04/20.     Per brother, patient recently had a prolonged behavioral health hospital stay from Aug - Dec 2021 before discharged to group care home in [place] which is a new environment for him.  Brother states that he has noted significant "grogginess" over the last few weeks since medication schedule changes (4x/day to 2x/day).  This has coincided with several falls that have not resulted in any significant injuries.  Clinical Impression  D/t extended hospitalization, PT re-evaluation performed.  Prior to hospital admission, per chart pt was living in a group home.  Pt noted with impaired insight into deficits and safety; impulsivity also noted.  Pt demonstrating generalized weakness and balance impairments.  Currently pt is SBA with bed mobility; min to mod assist with transfers; and min to mod assist to ambulate a few feet bed to recliner with RW.  Intermittent posterior lean noted in standing requiring assist for balance.  Pt would benefit from skilled PT to address noted impairments and functional limitations (see below for any additional details).  Upon hospital discharge, pt would benefit from STR.  Plan of care reviewed and updated.    Follow Up Recommendations SNF    Equipment Recommendations  Rolling walker with 5" wheels;3in1 (PT)    Recommendations for Other Services       Precautions / Restrictions Precautions Precautions: Fall Restrictions Weight Bearing Restrictions: No      Mobility  Bed Mobility Overal bed mobility: Needs Assistance Bed Mobility: Supine to Sit     Supine to sit: Supervision      General bed mobility comments: increased effort to perform on own; vc's to scoot to edge of bed to prepare for standing    Transfers Overall transfer level: Needs assistance Equipment used: Rolling walker (2 wheeled) Transfers: Sit to/from Stand Sit to Stand: Min assist;Mod assist         General transfer comment: vc's for UE placement and to shift weight forward once standing d/t posterior lean  Ambulation/Gait Ambulation/Gait assistance: Min assist;Mod assist Gait Distance (Feet): 3 Feet (bed to recliner) Assistive device: Rolling walker (2 wheeled)   Gait velocity: decreased   General Gait Details: vc's to stay within RW; assist for balance d/t posterior lean and unsteady; vc's to stay on task at hand  Stairs            Wheelchair Mobility    Modified Rankin (Stroke Patients Only)       Balance Overall balance assessment: Needs assistance Sitting-balance support: No upper extremity supported;Feet supported Sitting balance-Leahy Scale: Good Sitting balance - Comments: steady sitting reaching within BOS   Standing balance support: Bilateral upper extremity supported Standing balance-Leahy Scale: Poor Standing balance comment: requires BUE support and intermittent assist for balance                             Pertinent Vitals/Pain Pain Assessment: No/denies pain    Home Living Family/patient expects to be discharged to:: Group home                 Additional Comments: Per chart pt was living in a group home (difficult to  obtain PLOF d/t pt's cognition)    Prior Function Level of Independence: Needs assistance   Gait / Transfers Assistance Needed: Per previous therapy evaluation "Per brother, pt was not using any DME for functional mobility prior to his Aug-Dec hospitalization, and is unsure how pt has been moving since last admission. Pt's brother reports that pt has LE neuropathy at baseline, which has contributed to his hx of falls.  Reports that he has fallen much more frequently since living in the group home"  ADL's / Homemaking Assistance Needed: Per previous therapy evaluation "Pt's brother reports that he needed assistance with donning compression socks prior to his Aug-Dec hospitalization, and is unsure how much assistance he was receiving for dressing in group home. Per pt's brother, pt needed MIN-MOD A for bathing to ensure thoroughness"        Hand Dominance        Extremity/Trunk Assessment   Upper Extremity Assessment Upper Extremity Assessment: Defer to OT evaluation (defer to recent OT re-evaluation)    Lower Extremity Assessment Lower Extremity Assessment: Generalized weakness    Cervical / Trunk Assessment Cervical / Trunk Assessment: Other exceptions Cervical / Trunk Exceptions: forward head/shoulders  Communication   Communication:  (pt perseverating at times)  Cognition Arousal/Alertness: Awake/alert Behavior During Therapy: Restless Overall Cognitive Status: No family/caregiver present to determine baseline cognitive functioning Area of Impairment: Orientation;Attention;Memory;Following commands;Safety/judgement;Awareness;Problem solving                 Orientation Level: Disoriented to;Time;Situation Current Attention Level: Focused Memory: Decreased recall of precautions;Decreased short-term memory Following Commands: Follows one step commands consistently;Follows one step commands inconsistently Safety/Judgement: Decreased awareness of safety;Decreased awareness of deficits Awareness: Intellectual Problem Solving: Slow processing;Requires tactile cues;Requires verbal cues        General Comments General comments (skin integrity, edema, etc.): Pt received and left with art supplies (crayons, coloring pencils, and coloring books) per pt request.  Pt agreeable to PT session.    Exercises     Assessment/Plan    PT Assessment Patient needs continued PT services  PT  Problem List Decreased mobility;Decreased balance;Decreased activity tolerance;Decreased strength;Decreased coordination;Decreased cognition;Decreased knowledge of use of DME;Decreased safety awareness;Decreased knowledge of precautions       PT Treatment Interventions DME instruction;Gait training;Functional mobility training;Therapeutic activities;Cognitive remediation;Therapeutic exercise;Balance training;Patient/family education    PT Goals (Current goals can be found in the Care Plan section)  Acute Rehab PT Goals Patient Stated Goal: to discharge from hospital PT Goal Formulation: With patient Time For Goal Achievement: 09/04/20 Potential to Achieve Goals: Fair    Frequency Min 2X/week   Barriers to discharge Decreased caregiver support      Co-evaluation               AM-PAC PT "6 Clicks" Mobility  Outcome Measure Help needed turning from your back to your side while in a flat bed without using bedrails?: None Help needed moving from lying on your back to sitting on the side of a flat bed without using bedrails?: A Little Help needed moving to and from a bed to a chair (including a wheelchair)?: A Lot Help needed standing up from a chair using your arms (e.g., wheelchair or bedside chair)?: A Lot Help needed to walk in hospital room?: A Lot Help needed climbing 3-5 steps with a railing? : Total 6 Click Score: 14    End of Session Equipment Utilized During Treatment: Gait belt Activity Tolerance: Patient tolerated treatment well Patient left: in chair;with call bell/phone  within reach;with chair alarm set;Other (comment) Nurse Communication: Mobility status;Precautions (NT present during mobility part of session) PT Visit Diagnosis: Muscle weakness (generalized) (M62.81);Difficulty in walking, not elsewhere classified (R26.2)    Time: 1350-1415 PT Time Calculation (min) (ACUTE ONLY): 25 min   Charges:   PT Evaluation $PT Re-evaluation: 1 Re-eval PT  Treatments $Therapeutic Activity: 8-22 mins       Leitha Bleak, PT 08/21/20, 4:23 PM

## 2020-08-21 NOTE — TOC Progression Note (Signed)
Transition of Care [facility]) - Progression Note    Patient Details  Name: Alexander Beard MRN: [id number] Date of Birth: 1955/11/02  [facility]) CM/SW Contact  Shelbie Hutching, RN Phone Number: 08/21/2020, 3:25 PM  Clinical Narrative:     [facility] emailed referral for patient to [facility] [place].  They have beds and have agreed to look at referral.  Brenda's email is [email or web address].  Fax number is [phone].    Expected Discharge Plan: Group Home Barriers to Discharge: Continued Medical Work up  Expected Discharge Plan and Services Expected Discharge Plan: Group Home   Discharge Planning Services: CM Consult   Living arrangements for the past 2 months: Group Home                                       Social Determinants of Health (SDOH) Interventions    Readmission Risk Interventions Readmission Risk Prevention Plan 08/07/2020  Transportation Screening Complete  Medication Review (RN Care Manager) Complete  PCP or Specialist appointment within 3-5 days of discharge Complete  HRI or Home Care Consult Complete  SW Recovery Care/Counseling Consult Complete  Palliative Care Screening Not [place] Complete  Some recent data might be hidden

## 2020-08-22 DIAGNOSIS — E119 Type 2 diabetes mellitus without complications: Secondary | ICD-10-CM | POA: Diagnosis not present

## 2020-08-22 DIAGNOSIS — F250 Schizoaffective disorder, bipolar type: Secondary | ICD-10-CM | POA: Diagnosis not present

## 2020-08-22 DIAGNOSIS — I10 Essential (primary) hypertension: Secondary | ICD-10-CM | POA: Diagnosis not present

## 2020-08-22 LAB — GLUCOSE, CAPILLARY
Glucose-Capillary: 120 mg/dL — ABNORMAL HIGH (ref 70–99)
Glucose-Capillary: 172 mg/dL — ABNORMAL HIGH (ref 70–99)
Glucose-Capillary: 159 mg/dL — ABNORMAL HIGH (ref 70–99)
Glucose-Capillary: 80 mg/dL (ref 70–99)

## 2020-08-22 NOTE — Progress Notes (Signed)
PROGRESS NOTE    Alexander Beard  [id number]:[id number] DOB: June 02, 1956 DOA: 08/04/2020 PCP: Remi Haggard, FNP  Assessment & Plan:   Principal Problem:   Pneumonia due to COVID-19 virus Active Problems:   Schizoaffective disorder, bipolar type ([place])   Hypertension   Controlled type 2 diabetes mellitus without complication ([place])   Encephalopathy acute   Pneumonia  Acute hypoxic respiratory failure: secondary to COVID-19 pneumonia. Completed remdesivir and steroid courses.  Weaned off of supplemental oxygen. Resolved  Acute metabolic encephalopathy: likely overmedicated, dose has been reduced. Mental status waxes and wanes   Schizoaffective disorder: continue on zoloft, seroquel, lamictal & clonazepam. Long time group home resident    Hypokalemia: will monitor periodically   Thrombocytopenia: resolved  DM2: continue on SSI w/ accuchecks. Well controlled       DVT prophylaxis: lovenox  Code Status: full  Family Communication:  Disposition Plan: PT/OT recs SNF, still waiting on SNF placement   Level of care: Med-Surg   Status is: Inpatient  Remains inpatient appropriate because:Unsafe d/c plan   Dispo:  Patient From: Group Home  Planned Disposition: [place]  Expected discharge date: 08/24/2020  Medically stable for discharge: Yes          Consultants:      Procedures:   Antimicrobials:   Subjective: Pt states he is ready to d/c from the hospital. Pt denies any complaints   Objective: Vitals:   08/21/20 0744 08/21/20 1211 08/21/20 2048 08/22/20 0542  BP: 116/85 (!) 98/58 108/69 129/74  Pulse: 75 81 62 (!) 57  Resp: 18 20 16 18   Temp: 97.8 F (36.6 C) 98.7 F (37.1 C) (!) 97.5 F (36.4 C) 98.5 F (36.9 C)  TempSrc: Oral     SpO2: 96% 96% 95% 96%  Weight:      Height:       No intake or output data in the 24 hours ending 08/22/20 0724 Filed Weights   08/18/20 2304  Weight: 104 kg    Examination:  General exam:  Appears  comfortable  Respiratory system: clear breath sound b/l. No wheezes, rales  Cardiovascular system: S1/S2+. No rubs or clicks  Gastrointestinal system: Abd is soft, NT, ND & normal bowel sounds  Central nervous system: Alert and awake. Moves all 4 extremities  Psychiatry: Judgement and insight appear abnormal. Flat mood and affect     Data Reviewed: I have personally reviewed following labs and imaging studies  CBC: Recent Labs  Lab 08/18/20 0537 08/21/20 0651  WBC 5.3 4.3  HGB 11.5* 11.8*  HCT 35.7* 36.3*  MCV 94.2 94.8  PLT 215 762   Basic Metabolic Panel: Recent Labs  Lab 08/19/20 0814  NA 141  K 4.5  CL 103  CO2 28  GLUCOSE 114*  BUN 14  CREATININE 0.85  CALCIUM 9.1   GFR: Estimated Creatinine Clearance: 102.6 mL/min (by C-G formula based on SCr of 0.85 mg/dL). Liver Function Tests: No results for input(s): AST, ALT, ALKPHOS, BILITOT, PROT, ALBUMIN in the last 168 hours. No results for input(s): LIPASE, AMYLASE in the last 168 hours. No results for input(s): AMMONIA in the last 168 hours. Coagulation Profile: No results for input(s): INR, PROTIME in the last 168 hours. Cardiac Enzymes: No results for input(s): CKTOTAL, CKMB, CKMBINDEX, TROPONINI in the last 168 hours. BNP (last 3 results) No results for input(s): PROBNP in the last 8760 hours. HbA1C: No results for input(s): HGBA1C in the last 72 hours. CBG: Recent Labs  Lab 08/20/20  2112 08/21/20 0739 08/21/20 1209 08/21/20 1559 08/21/20 2047  GLUCAP 98 115* 176* 126* 105*   Lipid Profile: No results for input(s): CHOL, HDL, LDLCALC, TRIG, CHOLHDL, LDLDIRECT in the last 72 hours. Thyroid Function Tests: No results for input(s): TSH, T4TOTAL, FREET4, T3FREE, THYROIDAB in the last 72 hours. Anemia Panel: No results for input(s): VITAMINB12, FOLATE, FERRITIN, TIBC, IRON, RETICCTPCT in the last 72 hours. Sepsis Labs: No results for input(s): PROCALCITON, LATICACIDVEN in the last 168 hours.  No  results found for this or any previous visit (from the past 240 hour(s)).       Radiology Studies: No results found.      Scheduled Meds: . aspirin EC  81 mg Oral Daily  . clonazePAM  1 mg Oral BID  . divalproex  1,000 mg Oral QHS  . enoxaparin (LOVENOX) injection  40 mg Subcutaneous Q24H  . insulin aspart  0-5 Units Subcutaneous QHS  . insulin aspart  0-9 Units Subcutaneous TID WC  . lamoTRIgine  150 mg Oral QHS  . linagliptin  5 mg Oral Daily  . oxybutynin  10 mg Oral BID  . pantoprazole  40 mg Oral Daily  . QUEtiapine  100 mg Oral TID  . QUEtiapine  300 mg Oral QHS  . sertraline  50 mg Oral Daily  . simethicone  80 mg Oral TID with meals   Continuous Infusions:   LOS: 17 days    Time spent: 20 mins    Wyvonnia Dusky, MD [facility] Pager [phone]  If 7PM-7AM, please contact night-coverage 08/22/2020, 7:24 AM

## 2020-08-23 DIAGNOSIS — I10 Essential (primary) hypertension: Secondary | ICD-10-CM | POA: Diagnosis not present

## 2020-08-23 DIAGNOSIS — E119 Type 2 diabetes mellitus without complications: Secondary | ICD-10-CM | POA: Diagnosis not present

## 2020-08-23 DIAGNOSIS — F250 Schizoaffective disorder, bipolar type: Secondary | ICD-10-CM | POA: Diagnosis not present

## 2020-08-23 LAB — GLUCOSE, CAPILLARY
Glucose-Capillary: 116 mg/dL — ABNORMAL HIGH (ref 70–99)
Glucose-Capillary: 204 mg/dL — ABNORMAL HIGH (ref 70–99)
Glucose-Capillary: 124 mg/dL — ABNORMAL HIGH (ref 70–99)
Glucose-Capillary: 180 mg/dL — ABNORMAL HIGH (ref 70–99)

## 2020-08-23 NOTE — NC FL2 (Incomplete)
[place] LEVEL OF CARE SCREENING TOOL     IDENTIFICATION  Patient Name: Alexander Beard Birthdate: 1956/06/06 Sex: male Admission Date (Current Location): 08/04/2020  [place] and [place] Number:  Engineering geologist and Address:  [facility], [address], [place], [place] [address]      Provider Number: [id number]  Attending Physician Name and Address:  Wyvonnia Dusky, MD  Relative Name and Phone Number:       Current Level of Care: Hospital Recommended Level of Care: [place] Prior Approval Number:    Date Approved/Denied:   PASRR Number: Manual review.  Discharge Plan: SNF    Current Diagnoses: Patient Active Problem List   Diagnosis Date Noted  . Pneumonia 08/16/2020  . Pneumonia due to COVID-19 virus 08/04/2020  . Encephalopathy acute 08/04/2020  . Intellectual disability 03/06/2020  . At risk for elopement 03/06/2020  . Diabetes ([place]) 04/10/2015  . Hypertension 04/10/2015  . Prostate hypertrophy 04/10/2015  . Schizoaffective disorder ([place]) 04/10/2015  . Enlarged prostate 04/10/2015  . Essential (primary) hypertension 04/10/2015  . Type 2 diabetes mellitus ([place]) 04/10/2015  . Controlled type 2 diabetes mellitus without complication ([place]) 72/03/4708  . Schizoaffective disorder, bipolar type ([place]) 12/29/2014  . Active autistic disorder 12/29/2014    Orientation RESPIRATION BLADDER Height & Weight     Self  Normal Continent Weight: 104 kg Height:  5\' 8"  (172.7 cm)  BEHAVIORAL SYMPTOMS/MOOD NEUROLOGICAL BOWEL NUTRITION STATUS  Other (Comment) (Anxiety)  (None) Continent Diet (Regular)  AMBULATORY STATUS COMMUNICATION OF NEEDS Skin   Limited Assist Verbally Skin abrasions,Bruising                       Personal Care Assistance Level of Assistance  Bathing,Feeding,Dressing Bathing Assistance: Limited assistance Feeding assistance: Limited assistance Dressing Assistance: Limited  assistance     Functional Limitations Info  Sight,Hearing,Speech Sight Info: Adequate Hearing Info: Adequate Speech Info: Adequate (Delayed responses)    SPECIAL CARE FACTORS FREQUENCY  PT (By licensed PT),OT (By licensed OT)     PT Frequency: 5 x week OT Frequency: 5 x week            Contractures Contractures Info: Not present    Additional Factors Info  Code Status,Allergies,Psychotropic,Isolation Precautions Code Status Info: Full code Allergies Info: Myacins, Penicillins, Cortizone-10 (Hydrocortisone) Psychotropic Info: Schizoaffective disorder, bipolar type, Autism,   Isolation Precautions Info: COVID - tested positive 1/25.     Current Medications (08/23/2020):  This is the current hospital active medication list Current Facility-Administered Medications  Medication Dose Route Frequency Provider Last Rate Last Admin  . acetaminophen (TYLENOL) tablet 650 mg  650 mg Oral Q6H PRN Darrick Meigs, Marge Duncans, MD      . albuterol (VENTOLIN HFA) 108 (90 Base) MCG/ACT inhaler 1-2 puff  1-2 puff Inhalation Q6H PRN Oswald Hillock, MD      . aspirin EC tablet 81 mg  81 mg Oral Daily Oswald Hillock, MD   81 mg at 08/23/20 0956  . clonazePAM (KLONOPIN) tablet 1 mg  1 mg Oral BID Oswald Hillock, MD   1 mg at 08/23/20 0956  . divalproex (DEPAKOTE ER) 24 hr tablet 1,000 mg  1,000 mg Oral QHS Oswald Hillock, MD   1,000 mg at 08/22/20 2121  . enoxaparin (LOVENOX) injection 40 mg  40 mg Subcutaneous Q24H Oswald Hillock, MD   40 mg at 08/23/20 0956  . guaiFENesin-dextromethorphan (ROBITUSSIN DM) 100-10  MG/5ML syrup 10 mL  10 mL Oral Q4H PRN Oswald Hillock, MD      . hydrOXYzine (ATARAX/VISTARIL) tablet 10 mg  10 mg Oral TID PRN Oswald Hillock, MD      . insulin aspart (novoLOG) injection 0-5 Units  0-5 Units Subcutaneous QHS Oswald Hillock, MD   2 Units at 08/15/20 2018  . insulin aspart (novoLOG) injection 0-9 Units  0-9 Units Subcutaneous TID WC Oswald Hillock, MD   3 Units at 08/23/20 1251  . lamoTRIgine  (LAMICTAL) tablet 150 mg  150 mg Oral QHS Oswald Hillock, MD   150 mg at 08/22/20 2121  . linagliptin (TRADJENTA) tablet 5 mg  5 mg Oral Daily Oswald Hillock, MD   5 mg at 08/23/20 1251  . LORazepam (ATIVAN) tablet 2 mg  2 mg Oral Q6H PRN Oswald Hillock, MD   2 mg at 08/21/20 1550  . ondansetron (ZOFRAN) tablet 4 mg  4 mg Oral Q6H PRN Oswald Hillock, MD       Or  . ondansetron [facility]) injection 4 mg  4 mg Intravenous Q6H PRN Oswald Hillock, MD      . oxybutynin (DITROPAN) tablet 10 mg  10 mg Oral BID Oswald Hillock, MD   10 mg at 08/23/20 0956  . pantoprazole (PROTONIX) EC tablet 40 mg  40 mg Oral Daily Oswald Hillock, MD   40 mg at 08/23/20 0957  . QUEtiapine (SEROQUEL) tablet 100 mg  100 mg Oral TID Oswald Hillock, MD   100 mg at 08/23/20 0956  . QUEtiapine (SEROQUEL) tablet 300 mg  300 mg Oral QHS Oswald Hillock, MD   300 mg at 08/22/20 2130  . senna-docusate (Senokot-S) tablet 1 tablet  1 tablet Oral QHS PRN Oswald Hillock, MD      . sertraline (ZOLOFT) tablet 50 mg  50 mg Oral Daily Oswald Hillock, MD   50 mg at 08/23/20 0956  . simethicone (MYLICON) chewable tablet 80 mg  80 mg Oral TID with meals Oswald Hillock, MD   80 mg at 08/23/20 1251     Discharge Medications: Please see discharge summary for a list of discharge medications.  Relevant Imaging Results:  Relevant Lab Results:   Additional Information SS#: [id number]. Admitted from [facility]. Son has hired a Wellsite geologist through [employer] to work on placement in a highter level of care. COVID positive 1/25.

## 2020-08-23 NOTE — TOC Progression Note (Signed)
Transition of Care [facility]) - Progression Note    Patient Details  Name: Alexander Beard MRN: [id number] Date of Birth: 06-09-1956  Transition of Care [facility]) CM/SW Contact  Izola Price, RN Phone Number: 08/23/2020, 3:13 PM  Clinical Narrative:   2/13 315 pm. Attempted to reach out to patient per provider request. Patient has questions about placement status. Patient did not answer phone after several attempts. No bed offers as yet today. Just submitted on 08/21/20. Simmie Davies RN CM    Expected Discharge Plan: Group Home Barriers to Discharge: Continued Medical Work up  Expected Discharge Plan and Services Expected Discharge Plan: Group Home   Discharge Planning Services: CM Consult   Living arrangements for the past 2 months: Group Home                                       Social Determinants of Health (SDOH) Interventions    Readmission Risk Interventions Readmission Risk Prevention Plan 08/07/2020  Transportation Screening Complete  Medication Review Press photographer) Complete  PCP or Specialist appointment within 3-5 days of discharge Complete  HRI or Home Care Consult Complete  SW Recovery Care/Counseling Consult Complete  Palliative Care Screening Not [place] Complete  Some recent data might be hidden

## 2020-08-23 NOTE — Progress Notes (Signed)
PROGRESS NOTE    Alexander Beard  MEQ:[id number] DOB: 04-23-1956 DOA: 08/04/2020 PCP: Remi Haggard, FNP  Assessment & Plan:   Principal Problem:   Pneumonia due to COVID-19 virus Active Problems:   Schizoaffective disorder, bipolar type ([place])   Hypertension   Controlled type 2 diabetes mellitus without complication ([place])   Encephalopathy acute   Pneumonia  Acute hypoxic respiratory failure: secondary to COVID-19 pneumonia. Completed remdesivir and steroid courses. Resolved   Acute metabolic encephalopathy: likely overmedicated, dose has been reduced. Mental status waxes and wanes   Schizoaffective disorder: long time group home resident. Continue on clonazepam, lamictal, seroquel, & zoloft   Hypokalemia: will monitor periodically   Thrombocytopenia: resolved  DM2: well controlled. Continue on SSI w/ accuchecks       DVT prophylaxis: lovenox  Code Status: full  Family Communication:  Disposition Plan: PT/OT recs SNF, still waiting on SNF placement   Level of care: Med-Surg   Status is: Inpatient  Remains inpatient appropriate because:Unsafe d/c plan   Dispo:  Patient From: Group Home  Planned Disposition: [place]  Expected discharge date: 08/24/2020  Medically stable for discharge: Yes     Consultants:      Procedures:   Antimicrobials:   Subjective: Pt denies any complaints   Objective: Vitals:   08/22/20 1140 08/22/20 1545 08/22/20 2117 08/23/20 0325  BP: 108/68 111/73 (!) 141/90 106/80  Pulse: 71 78 64 83  Resp: 14 16 18 18   Temp: 98.5 F (36.9 C) 98.2 F (36.8 C) 97.9 F (36.6 C) 97.6 F (36.4 C)  TempSrc:   Oral   SpO2: 99% 98% 97% 94%  Weight:      Height:        Intake/Output Summary (Last 24 hours) at 08/23/2020 0740 Last data filed at 08/22/2020 0745 Gross per 24 hour  Intake -  Output 200 ml  Net -200 ml   Filed Weights   08/18/20 2304  Weight: 104 kg    Examination:  General exam:  Appears calm  & comfortable  Respiratory system: clear breath sounds b/l  Cardiovascular system: S1 & S2+. No rubs or clicks  Gastrointestinal system: Abd is soft, NT, ND & hypoactive bowel sounds  Central nervous system: Alert and awake. Moves all 4 extremities  Psychiatry: Judgement and insight appear abnormal. Flat mood and affect     Data Reviewed: I have personally reviewed following labs and imaging studies  CBC: Recent Labs  Lab 08/18/20 0537 08/21/20 0651  WBC 5.3 4.3  HGB 11.5* 11.8*  HCT 35.7* 36.3*  MCV 94.2 94.8  PLT 215 297   Basic Metabolic Panel: Recent Labs  Lab 08/19/20 0814  NA 141  K 4.5  CL 103  CO2 28  GLUCOSE 114*  BUN 14  CREATININE 0.85  CALCIUM 9.1   GFR: Estimated Creatinine Clearance: 102.6 mL/min (by C-G formula based on SCr of 0.85 mg/dL). Liver Function Tests: No results for input(s): AST, ALT, ALKPHOS, BILITOT, PROT, ALBUMIN in the last 168 hours. No results for input(s): LIPASE, AMYLASE in the last 168 hours. No results for input(s): AMMONIA in the last 168 hours. Coagulation Profile: No results for input(s): INR, PROTIME in the last 168 hours. Cardiac Enzymes: No results for input(s): CKTOTAL, CKMB, CKMBINDEX, TROPONINI in the last 168 hours. BNP (last 3 results) No results for input(s): PROBNP in the last 8760 hours. HbA1C: No results for input(s): HGBA1C in the last 72 hours. CBG: Recent Labs  Lab 08/21/20 2047 08/22/20  5859 08/22/20 1156 08/22/20 1618 08/22/20 2117  GLUCAP 105* 120* 172* 159* 80   Lipid Profile: No results for input(s): CHOL, HDL, LDLCALC, TRIG, CHOLHDL, LDLDIRECT in the last 72 hours. Thyroid Function Tests: No results for input(s): TSH, T4TOTAL, FREET4, T3FREE, THYROIDAB in the last 72 hours. Anemia Panel: No results for input(s): VITAMINB12, FOLATE, FERRITIN, TIBC, IRON, RETICCTPCT in the last 72 hours. Sepsis Labs: No results for input(s): PROCALCITON, LATICACIDVEN in the last 168 hours.  No results found  for this or any previous visit (from the past 240 hour(s)).       Radiology Studies: No results found.      Scheduled Meds: . aspirin EC  81 mg Oral Daily  . clonazePAM  1 mg Oral BID  . divalproex  1,000 mg Oral QHS  . enoxaparin (LOVENOX) injection  40 mg Subcutaneous Q24H  . insulin aspart  0-5 Units Subcutaneous QHS  . insulin aspart  0-9 Units Subcutaneous TID WC  . lamoTRIgine  150 mg Oral QHS  . linagliptin  5 mg Oral Daily  . oxybutynin  10 mg Oral BID  . pantoprazole  40 mg Oral Daily  . QUEtiapine  100 mg Oral TID  . QUEtiapine  300 mg Oral QHS  . sertraline  50 mg Oral Daily  . simethicone  80 mg Oral TID with meals   Continuous Infusions:   LOS: 18 days    Time spent: 15 mins    Wyvonnia Dusky, MD [facility] Pager [phone]  If 7PM-7AM, please contact night-coverage 08/23/2020, 7:40 AM

## 2020-08-24 DIAGNOSIS — I10 Essential (primary) hypertension: Secondary | ICD-10-CM | POA: Diagnosis not present

## 2020-08-24 DIAGNOSIS — E119 Type 2 diabetes mellitus without complications: Secondary | ICD-10-CM | POA: Diagnosis not present

## 2020-08-24 DIAGNOSIS — F250 Schizoaffective disorder, bipolar type: Secondary | ICD-10-CM | POA: Diagnosis not present

## 2020-08-24 LAB — GLUCOSE, CAPILLARY
Glucose-Capillary: 89 mg/dL (ref 70–99)
Glucose-Capillary: 175 mg/dL — ABNORMAL HIGH (ref 70–99)
Glucose-Capillary: 124 mg/dL — ABNORMAL HIGH (ref 70–99)

## 2020-08-24 NOTE — Progress Notes (Signed)
PROGRESS NOTE    Alexander Beard  DPO:[id number] DOB: [birth year] DOA: 08/04/2020 PCP: Remi Haggard, FNP  Assessment & Plan:   Principal Problem:   Pneumonia due to COVID-19 virus Active Problems:   Schizoaffective disorder, bipolar type ([place])   Hypertension   Controlled type 2 diabetes mellitus without complication ([place])   Encephalopathy acute   Pneumonia  Acute hypoxic respiratory failure: secondary to COVID19 pneumonia. Completed remdesivir & steroid courses. Resolved  Acute metabolic encephalopathy: likely overmedicated, dose has been reduced. Mental status waxes and wanes   Schizoaffective disorder: long time group home resident. Continue on clonazepam, lamictal, seroquel, & zoloft   Hypokalemia: will monitor periodically   Thrombocytopenia: resolved  DM2: continue on SSI w/ accuchecks. Well controlled       DVT prophylaxis: lovenox  Code Status: full  Family Communication:  Disposition Plan: PT/OT recs SNF, still waiting on SNF placement   Level of care: Med-Surg   Status is: Inpatient  Remains inpatient appropriate because:Unsafe d/c plan   Dispo:  Patient From: Group Home  Planned Disposition: [place]  Expected discharge date: 08/24/2020  Medically stable for discharge: Yes     Consultants:      Procedures:   Antimicrobials:   Subjective: Pt is ready to be d/c from the hospital otherwise pt denies any complaints   Objective: Vitals:   08/23/20 1604 08/23/20 2016 08/23/20 2331 08/24/20 0408  BP: 116/88 (!) 117/96 101/66 117/84  Pulse: 91 81 79 76  Resp: 18 18 16 16   Temp: 98.7 F (37.1 C) 99.1 F (37.3 C) 97.9 F (36.6 C) 97.9 F (36.6 C)  TempSrc:      SpO2: 98% 96% 95% 96%  Weight:      Height:       No intake or output data in the 24 hours ending 08/24/20 0713 Filed Weights   08/18/20 2304  Weight: 104 kg    Examination:  General exam:  Appears comfortable  Respiratory system: clear breath sounds  b/l. No rales  Cardiovascular system: S1/S2+. No clicks Gastrointestinal system: Abd is soft, NT, ND & normal bowel sounds  Central nervous system: Alert and awake. Moves all 4 extremities  Psychiatry: Judgement and insight appear abnormal. Flat mood and affect     Data Reviewed: I have personally reviewed following labs and imaging studies  CBC: Recent Labs  Lab 08/18/20 0537 08/21/20 0651  WBC 5.3 4.3  HGB 11.5* 11.8*  HCT 35.7* 36.3*  MCV 94.2 94.8  PLT 215 431   Basic Metabolic Panel: Recent Labs  Lab 08/19/20 0814  NA 141  K 4.5  CL 103  CO2 28  GLUCOSE 114*  BUN 14  CREATININE 0.85  CALCIUM 9.1   GFR: Estimated Creatinine Clearance: 102.6 mL/min (by C-G formula based on SCr of 0.85 mg/dL). Liver Function Tests: No results for input(s): AST, ALT, ALKPHOS, BILITOT, PROT, ALBUMIN in the last 168 hours. No results for input(s): LIPASE, AMYLASE in the last 168 hours. No results for input(s): AMMONIA in the last 168 hours. Coagulation Profile: No results for input(s): INR, PROTIME in the last 168 hours. Cardiac Enzymes: No results for input(s): CKTOTAL, CKMB, CKMBINDEX, TROPONINI in the last 168 hours. BNP (last 3 results) No results for input(s): PROBNP in the last 8760 hours. HbA1C: No results for input(s): HGBA1C in the last 72 hours. CBG: Recent Labs  Lab 08/22/20 2117 08/23/20 0756 08/23/20 1157 08/23/20 1655 08/23/20 2131  GLUCAP 80 116* 204* 124* 180*  Lipid Profile: No results for input(s): CHOL, HDL, LDLCALC, TRIG, CHOLHDL, LDLDIRECT in the last 72 hours. Thyroid Function Tests: No results for input(s): TSH, T4TOTAL, FREET4, T3FREE, THYROIDAB in the last 72 hours. Anemia Panel: No results for input(s): VITAMINB12, FOLATE, FERRITIN, TIBC, IRON, RETICCTPCT in the last 72 hours. Sepsis Labs: No results for input(s): PROCALCITON, LATICACIDVEN in the last 168 hours.  No results found for this or any previous visit (from the past 240 hour(s)).        Radiology Studies: No results found.      Scheduled Meds: . aspirin EC  81 mg Oral Daily  . clonazePAM  1 mg Oral BID  . divalproex  1,000 mg Oral QHS  . enoxaparin (LOVENOX) injection  40 mg Subcutaneous Q24H  . insulin aspart  0-5 Units Subcutaneous QHS  . insulin aspart  0-9 Units Subcutaneous TID WC  . lamoTRIgine  150 mg Oral QHS  . linagliptin  5 mg Oral Daily  . oxybutynin  10 mg Oral BID  . pantoprazole  40 mg Oral Daily  . QUEtiapine  100 mg Oral TID  . QUEtiapine  300 mg Oral QHS  . sertraline  50 mg Oral Daily  . simethicone  80 mg Oral TID with meals   Continuous Infusions:   LOS: 19 days    Time spent: 15 mins    Wyvonnia Dusky, MD [facility] Pager [phone]  If 7PM-7AM, please contact night-coverage 08/24/2020, 7:13 AM

## 2020-08-24 NOTE — Progress Notes (Signed)
Physical Therapy Treatment Patient Details Name: Alexander Beard MRN: [id number] DOB: January 01, 1956 Today's Date: 08/24/2020    History of Present Illness 65 y.o. male with medical history significant for schizoaffective disorder, bipolar type, autism spectrum disorder, COPD, T2DM, HTN, HLD who presents to the ED for evaluation of shortness of breath. Covid-19+ on 08/04/20.     Per brother, patient recently had a prolonged behavioral health hospital stay from Aug - Dec 2021 before discharged to group care home in [place] which is a new environment for him.  Brother states that he has noted significant "grogginess" over the last few weeks since medication schedule changes (4x/day to 2x/day).  This has coincided with several falls that have not resulted in any significant injuries.    PT Comments    Pt resting in bed upon PT arrival; agreeable and appearing eager to participate in therapy session.  SBA semi-supine to sitting edge of bed; min assist with transfers (to steady); and min assist ambulating 40 feet with RW (pt requiring significant extra time to walk d/t slow walking speed and pt stopping frequently).  Pt progressing with ambulation distance but requiring assist with mobility for balance and safety.  Will continue to focus on strengthening, balance, and progressive functional mobility.    Follow Up Recommendations  SNF     Equipment Recommendations  Rolling walker with 5" wheels;3in1 (PT)    Recommendations for Other Services       Precautions / Restrictions Precautions Precautions: Fall Restrictions Weight Bearing Restrictions: No    Mobility  Bed Mobility Overal bed mobility: Needs Assistance Bed Mobility: Supine to Sit     Supine to sit: Supervision     General bed mobility comments: increased effort to perform on own; vc's to scoot to edge of bed to prepare for standing    Transfers Overall transfer level: Needs assistance Equipment used: Rolling walker (2  wheeled) Transfers: Sit to/from Stand Sit to Stand: Min assist         General transfer comment: assist to steady when standing  Ambulation/Gait Ambulation/Gait assistance: Min assist (chair follow) Gait Distance (Feet): 40 Feet Assistive device: Rolling walker (2 wheeled)   Gait velocity: decreased   General Gait Details: decreased B LE step length; intermittent vc's to not "stomp feet" when advancing LE's; vc's to take longer step lengths; very slow and deliberate steps with pt stopping frequently; vc's for upright posture with fatigue   Stairs             Wheelchair Mobility    Modified Rankin (Stroke Patients Only)       Balance Overall balance assessment: Needs assistance Sitting-balance support: No upper extremity supported;Feet supported Sitting balance-Leahy Scale: Good Sitting balance - Comments: steady sitting reaching within BOS   Standing balance support: Single extremity supported Standing balance-Leahy Scale: Poor Standing balance comment: pt requiring at least single UE support for static standing balance                            Cognition Arousal/Alertness: Awake/alert Behavior During Therapy: WFL for tasks assessed/performed Overall Cognitive Status: No family/caregiver present to determine baseline cognitive functioning Area of Impairment: Orientation;Attention;Memory;Following commands;Safety/judgement;Awareness;Problem solving                 Orientation Level: Disoriented to;Time;Situation Current Attention Level: Focused Memory: Decreased recall of precautions;Decreased short-term memory Following Commands: Follows one step commands inconsistently Safety/Judgement: Decreased awareness of safety;Decreased awareness of deficits Awareness: Intellectual  Problem Solving: Slow processing;Requires tactile cues;Requires verbal cues        Exercises      General Comments General comments (skin integrity, edema, etc.):  Pt received and left with art supplies (crayons, coloring pencils, and coloring books) per pt request.  Nursing cleared pt for participation in physical therapy.  Pt agreeable to PT session.      Pertinent Vitals/Pain Pain Assessment: No/denies pain  Vitals (HR and O2 on room air) stable and WFL throughout treatment session.    Home Living                      Prior Function            PT Goals (current goals can now be found in the care plan section) Acute Rehab PT Goals Patient Stated Goal: to discharge from hospital PT Goal Formulation: With patient Time For Goal Achievement: 09/04/20 Potential to Achieve Goals: Fair Progress towards PT goals: Progressing toward goals    Frequency    Min 2X/week      PT Plan Current plan remains appropriate    Co-evaluation              AM-PAC PT "6 Clicks" Mobility   Outcome Measure  Help needed turning from your back to your side while in a flat bed without using bedrails?: None Help needed moving from lying on your back to sitting on the side of a flat bed without using bedrails?: A Little Help needed moving to and from a bed to a chair (including a wheelchair)?: A Little Help needed standing up from a chair using your arms (e.g., wheelchair or bedside chair)?: A Little Help needed to walk in hospital room?: A Little Help needed climbing 3-5 steps with a railing? : A Lot 6 Click Score: 18    End of Session Equipment Utilized During Treatment: Gait belt Activity Tolerance: Patient tolerated treatment well Patient left: in chair;with call bell/phone within reach;with chair alarm set;Other (comment) (fall mat in place; nurse notified pt in chair) Nurse Communication: Mobility status;Precautions PT Visit Diagnosis: Muscle weakness (generalized) (M62.81);Difficulty in walking, not elsewhere classified (R26.2)     Time: 4098-1191 PT Time Calculation (min) (ACUTE ONLY): 45 min  Charges:  $Gait Training: 23-37  mins $Therapeutic Exercise: 8-22 mins                    Leitha Bleak, PT 08/24/20, 12:14 PM

## 2020-08-25 DIAGNOSIS — E119 Type 2 diabetes mellitus without complications: Secondary | ICD-10-CM | POA: Diagnosis not present

## 2020-08-25 DIAGNOSIS — F250 Schizoaffective disorder, bipolar type: Secondary | ICD-10-CM | POA: Diagnosis not present

## 2020-08-25 DIAGNOSIS — I10 Essential (primary) hypertension: Secondary | ICD-10-CM | POA: Diagnosis not present

## 2020-08-25 LAB — GLUCOSE, CAPILLARY
Glucose-Capillary: 96 mg/dL (ref 70–99)
Glucose-Capillary: 164 mg/dL — ABNORMAL HIGH (ref 70–99)
Glucose-Capillary: 123 mg/dL — ABNORMAL HIGH (ref 70–99)
Glucose-Capillary: 150 mg/dL — ABNORMAL HIGH (ref 70–99)

## 2020-08-25 NOTE — Progress Notes (Signed)
PROGRESS NOTE    Alexander Beard  KNL:[id number] DOB: 11-20-1955 DOA: 08/04/2020 PCP: Remi Haggard, FNP  Assessment & Plan:   Principal Problem:   Pneumonia due to COVID-19 virus Active Problems:   Schizoaffective disorder, bipolar type ([place])   Hypertension   Controlled type 2 diabetes mellitus without complication ([place])   Encephalopathy acute   Pneumonia  Acute hypoxic respiratory failure: resolved. Completed remdesivir & steroid courses. Secondary to COVID19 pneumonia   Acute metabolic encephalopathy: likely overmedicated, dose has been reduced. Mental status waxes and wanes   Schizoaffective disorder: long time group home resident. Continue on clonazepam, lamictal, seroquel, & zoloft   Hypokalemia: will periodically monitor   Thrombocytopenia: resolved  DM2: well controlled. Continue on SSI w/ accuchecks       DVT prophylaxis: lovenox  Code Status: full  Family Communication:  Disposition Plan: PT/OT recs SNF, still waiting on SNF placement   Level of care: Med-Surg   Status is: Inpatient  Remains inpatient appropriate because:Unsafe d/c plan   Dispo:  Patient From: home   Planned Disposition: whenever SNF bed is available  Expected discharge date: 08/27/2020  Medically stable for discharge: yes      Consultants:      Procedures:   Antimicrobials:   Subjective: Pt wants some orange juice & coke otherwise pt denies any complaints   Objective: Vitals:   08/24/20 1800 08/24/20 1945 08/25/20 0136 08/25/20 0504  BP: 114/83 120/80 105/71 101/67  Pulse: 73 77 78 86  Resp:  18 18 19   Temp: 98.5 F (36.9 C) 97.8 F (36.6 C) 98.2 F (36.8 C) 97.8 F (36.6 C)  TempSrc: Oral Oral Oral   SpO2: 100% 99% 96% 96%  Weight:      Height:        Intake/Output Summary (Last 24 hours) at 08/25/2020 0734 Last data filed at 08/24/2020 1823 Gross per 24 hour  Intake 120 ml  Output --  Net 120 ml   Filed Weights   08/18/20 2304  Weight: 104 kg     Examination:  General exam:  Appears calm & comfortable  Respiratory system: clear breath sounds b/l.  Cardiovascular system: S1 & S2+. No clicks or gallops  Gastrointestinal system: Abd is soft, NT, ND & normal bowel sounds  Central nervous system: Alert and awake. Moves all 4 extremities  Psychiatry: Judgement and insight appear abnormal. Flat mood and affect     Data Reviewed: I have personally reviewed following labs and imaging studies  CBC: Recent Labs  Lab 08/21/20 0651  WBC 4.3  HGB 11.8*  HCT 36.3*  MCV 94.8  PLT 790   Basic Metabolic Panel: Recent Labs  Lab 08/19/20 0814  NA 141  K 4.5  CL 103  CO2 28  GLUCOSE 114*  BUN 14  CREATININE 0.85  CALCIUM 9.1   GFR: Estimated Creatinine Clearance: 102.6 mL/min (by C-G formula based on SCr of 0.85 mg/dL). Liver Function Tests: No results for input(s): AST, ALT, ALKPHOS, BILITOT, PROT, ALBUMIN in the last 168 hours. No results for input(s): LIPASE, AMYLASE in the last 168 hours. No results for input(s): AMMONIA in the last 168 hours. Coagulation Profile: No results for input(s): INR, PROTIME in the last 168 hours. Cardiac Enzymes: No results for input(s): CKTOTAL, CKMB, CKMBINDEX, TROPONINI in the last 168 hours. BNP (last 3 results) No results for input(s): PROBNP in the last 8760 hours. HbA1C: No results for input(s): HGBA1C in the last 72 hours. CBG: Recent Labs  Lab  08/23/20 1655 08/23/20 2131 08/24/20 0810 08/24/20 1141 08/24/20 1949  GLUCAP 124* 180* 89 175* 124*   Lipid Profile: No results for input(s): CHOL, HDL, LDLCALC, TRIG, CHOLHDL, LDLDIRECT in the last 72 hours. Thyroid Function Tests: No results for input(s): TSH, T4TOTAL, FREET4, T3FREE, THYROIDAB in the last 72 hours. Anemia Panel: No results for input(s): VITAMINB12, FOLATE, FERRITIN, TIBC, IRON, RETICCTPCT in the last 72 hours. Sepsis Labs: No results for input(s): PROCALCITON, LATICACIDVEN in the last 168 hours.  No  results found for this or any previous visit (from the past 240 hour(s)).       Radiology Studies: No results found.      Scheduled Meds: . aspirin EC  81 mg Oral Daily  . clonazePAM  1 mg Oral BID  . divalproex  1,000 mg Oral QHS  . enoxaparin (LOVENOX) injection  40 mg Subcutaneous Q24H  . insulin aspart  0-5 Units Subcutaneous QHS  . insulin aspart  0-9 Units Subcutaneous TID WC  . lamoTRIgine  150 mg Oral QHS  . linagliptin  5 mg Oral Daily  . oxybutynin  10 mg Oral BID  . pantoprazole  40 mg Oral Daily  . QUEtiapine  100 mg Oral TID  . QUEtiapine  300 mg Oral QHS  . sertraline  50 mg Oral Daily  . simethicone  80 mg Oral TID with meals   Continuous Infusions:   LOS: 20 days    Time spent: 15 mins    Wyvonnia Dusky, MD [facility] Pager [phone]  If 7PM-7AM, please contact night-coverage 08/25/2020, 7:34 AM

## 2020-08-25 NOTE — Progress Notes (Signed)
Occupational Therapy Treatment Patient Details Name: Alexander Beard MRN: [id number] DOB: 1955/09/13 Today's Date: 08/25/2020    History of present illness 65 y.o. male with medical history significant for schizoaffective disorder, bipolar type, autism spectrum disorder, COPD, T2DM, HTN, HLD who presents to the ED for evaluation of shortness of breath. Covid-19+ on 08/04/20.     Per brother, patient recently had a prolonged behavioral health hospital stay from Aug - Dec 2021 before discharged to group care home in [place] which is a new environment for him.  Brother states that he has noted significant "grogginess" over the last few weeks since medication schedule changes (4x/day to 2x/day).  This has coincided with several falls that have not resulted in any significant injuries.   OT comments  Pt seen for OT treatment on this date. Upon arrival to room pt seated in recliner and coloring. Pt A&Ox3, reporting no pain, and agreeable to tx. Pt verbalized goal to walk this session. Pt demonstrated good dynamic sitting balance this session, as he was able to bend over and reach beyond BOS to adjust socks requiring only MIN GUARD. Pt required MIN A for sit<>stand transfer, however, pt demonstrated significant posterior lean, and was unable to maintain static standing balance with RW for >5 seconds during x3 static standing trials; unsafe to attempt forward steps and standing ADLs with unilateral UE this session. Pt making good progress toward goals and continues to benefit from skilled OT services to maximize return to PLOF and minimize risk of future falls, injury, caregiver burden, and readmission. Will continue to follow POC. Discharge recommendation remains appropriate.    Follow Up Recommendations  SNF    Equipment Recommendations  3 in 1 bedside commode       Precautions / Restrictions Precautions Precautions: Fall Restrictions Weight Bearing Restrictions: No       Mobility Bed  Mobility               General bed mobility comments: Upon arrival to room, pt seated in recliner  Transfers Overall transfer level: Needs assistance Equipment used: Rolling walker (2 wheeled) Transfers: Sit to/from Stand Sit to Stand: Min assist         General transfer comment: assist to steady when standing    Balance Overall balance assessment: Needs assistance Sitting-balance support: No upper extremity supported;Feet supported Sitting balance-Leahy Scale: Good Sitting balance - Comments: steady sitting reaching outside BOS to adjust socks   Standing balance support: Bilateral upper extremity supported;During functional activity Standing balance-Leahy Scale: Poor Standing balance comment: Unable to remove single UE to engage in standing grooming                           ADL either performed or assessed with clinical judgement   ADL Overall ADL's : Needs assistance/impaired Eating/Feeding: Minimal assistance;Sitting Eating/Feeding Details (indicate cue type and reason): Intermittent MIN A to stabilize cup 2/2 tremors Grooming: Wash/dry hands;Wash/dry face;Supervision/safety;Set up;Sitting               Lower Body Dressing: Min guard;Sitting/lateral leans Lower Body Dressing Details (indicate cue type and reason): pt able to bend and reach beyong BOS to adjust socks while seated in chair                               Cognition Arousal/Alertness: Awake/alert Behavior During Therapy: [facility] for tasks assessed/performed  General Comments: Alert and oriented to self, place, and situation; disoriented to date. Required multi-model cues t/o                   Pertinent Vitals/ Pain       Pain Assessment: No/denies pain   Frequency  Min 2X/week        Progress Toward Goals  OT Goals(current goals can now be found in the care plan section)  Progress towards OT goals: Progressing  toward goals  Acute Rehab OT Goals Patient Stated Goal: to discharge from hospital OT Goal Formulation: With patient Time For Goal Achievement: 09/03/20 Potential to Achieve Goals: Good  Plan Discharge plan remains appropriate;Frequency remains appropriate       AM-PAC OT "6 Clicks" Daily Activity     Outcome Measure   Help from another person eating meals?: A Little Help from another person taking care of personal grooming?: A Little Help from another person toileting, which includes using toliet, bedpan, or urinal?: A Lot Help from another person bathing (including washing, rinsing, drying)?: A Lot Help from another person to put on and taking off regular upper body clothing?: A Little Help from another person to put on and taking off regular lower body clothing?: A Lot 6 Click Score: 15    End of Session Equipment Utilized During Treatment: Gait belt;Rolling walker  OT Visit Diagnosis: Unsteadiness on feet (R26.81);History of falling (Z91.81)   Activity Tolerance Patient tolerated treatment well   Patient Left in chair;with call bell/phone within reach;with chair alarm set   Nurse Communication Mobility status        Time: 1430-1450 OT Time Calculation (min): 20 min  Charges: OT General Charges $OT Visit: 1 Visit OT Treatments $Therapeutic Activity: 8-22 mins  Fredirick Maudlin, OTR/L [place]

## 2020-08-26 DIAGNOSIS — E119 Type 2 diabetes mellitus without complications: Secondary | ICD-10-CM | POA: Diagnosis not present

## 2020-08-26 DIAGNOSIS — I10 Essential (primary) hypertension: Secondary | ICD-10-CM | POA: Diagnosis not present

## 2020-08-26 DIAGNOSIS — F250 Schizoaffective disorder, bipolar type: Secondary | ICD-10-CM | POA: Diagnosis not present

## 2020-08-26 LAB — BLOOD GAS, VENOUS
Acid-Base Excess: 9.9 mmol/L — ABNORMAL HIGH (ref 0.0–2.0)
Bicarbonate: 37.5 mmol/L — ABNORMAL HIGH (ref 20.0–28.0)
O2 Saturation: 23.7 %
Patient temperature: 37
pCO2, Ven: 62 mmHg — ABNORMAL HIGH (ref 44.0–60.0)
pH, Ven: 7.39 (ref 7.250–7.430)

## 2020-08-26 LAB — GLUCOSE, CAPILLARY
Glucose-Capillary: 82 mg/dL (ref 70–99)
Glucose-Capillary: 184 mg/dL — ABNORMAL HIGH (ref 70–99)
Glucose-Capillary: 149 mg/dL — ABNORMAL HIGH (ref 70–99)

## 2020-08-26 MED ORDER — HYDRALAZINE HCL 50 MG PO TABS: 50.0000 mg | ORAL_TABLET | Freq: Four times a day (QID) | ORAL | Status: DC | PRN

## 2020-08-26 NOTE — Progress Notes (Signed)
PROGRESS NOTE    Alexander Beard  XIH:[id number] DOB: 1955/08/14 DOA: 08/04/2020 PCP: Remi Haggard, FNP  Assessment & Plan:   Principal Problem:   Pneumonia due to COVID-19 virus Active Problems:   Schizoaffective disorder, bipolar type ([place])   Hypertension   Controlled type 2 diabetes mellitus without complication ([place])   Encephalopathy acute   Pneumonia  Acute hypoxic respiratory failure: resolved. Completed remdesivir & steroid courses. Secondary to COVID19 pneumonia   Acute metabolic encephalopathy: likely overmedicated, dose has been reduced. Mental status waxes and wanes   Schizoaffective disorder: continue on home dose of zoloft, clonazepam, lamictal & seroquel. Long time group resident   Hypokalemia: will monitor intermittently    Thrombocytopenia: resolved  DM2: continue on tradjenta, SSI w/ accuchecks. Well controlled       DVT prophylaxis: lovenox  Code Status: full  Family Communication:  Disposition Plan: PT/OT recs SNF, still waiting on SNF placement   Level of care: Med-Surg   Status is: Inpatient  Remains inpatient appropriate because:Unsafe d/c plan   Dispo:  Patient From: home   Planned Disposition: whenever SNF bed is available  Expected discharge date: 08/27/2020  Medically stable for discharge: yes      Consultants:      Procedures:   Antimicrobials:   Subjective: Pt denies any complaints   Objective: Vitals:   08/25/20 1859 08/25/20 2012 08/26/20 0029 08/26/20 0426  BP:  130/80 124/84 97/61  Pulse:  68 82 70  Resp:  15 16 16   Temp:  98.8 F (37.1 C) 98.9 F (37.2 C) 98.8 F (37.1 C)  TempSrc:  Oral Oral Oral  SpO2: 95% 95% 96% 95%  Weight:      Height:        Intake/Output Summary (Last 24 hours) at 08/26/2020 0723 Last data filed at 08/25/2020 2330 Gross per 24 hour  Intake 600 ml  Output 325 ml  Net 275 ml   Filed Weights   08/18/20 2304  Weight: 104 kg    Examination:  General exam:  Appears  comfortable  Respiratory system: clear breath sounds b/l Cardiovascular system: S1/S2+. No rubs or clicks  Gastrointestinal system: abd is soft, NT, ND & normal bowel sounds  Central nervous system: Alert and awake. Moves all 4 extremities  Psychiatry: Judgement and insight appear abnormal. Flat mood and affect     Data Reviewed: I have personally reviewed following labs and imaging studies  CBC: Recent Labs  Lab 08/21/20 0651  WBC 4.3  HGB 11.8*  HCT 36.3*  MCV 94.8  PLT 349   Basic Metabolic Panel: Recent Labs  Lab 08/19/20 0814  NA 141  K 4.5  CL 103  CO2 28  GLUCOSE 114*  BUN 14  CREATININE 0.85  CALCIUM 9.1   GFR: Estimated Creatinine Clearance: 102.6 mL/min (by C-G formula based on SCr of 0.85 mg/dL). Liver Function Tests: No results for input(s): AST, ALT, ALKPHOS, BILITOT, PROT, ALBUMIN in the last 168 hours. No results for input(s): LIPASE, AMYLASE in the last 168 hours. No results for input(s): AMMONIA in the last 168 hours. Coagulation Profile: No results for input(s): INR, PROTIME in the last 168 hours. Cardiac Enzymes: No results for input(s): CKTOTAL, CKMB, CKMBINDEX, TROPONINI in the last 168 hours. BNP (last 3 results) No results for input(s): PROBNP in the last 8760 hours. HbA1C: No results for input(s): HGBA1C in the last 72 hours. CBG: Recent Labs  Lab 08/24/20 1949 08/25/20 0742 08/25/20 1136 08/25/20 1630 08/25/20 2117  GLUCAP 124* 96 164* 123* 150*   Lipid Profile: No results for input(s): CHOL, HDL, LDLCALC, TRIG, CHOLHDL, LDLDIRECT in the last 72 hours. Thyroid Function Tests: No results for input(s): TSH, T4TOTAL, FREET4, T3FREE, THYROIDAB in the last 72 hours. Anemia Panel: No results for input(s): VITAMINB12, FOLATE, FERRITIN, TIBC, IRON, RETICCTPCT in the last 72 hours. Sepsis Labs: No results for input(s): PROCALCITON, LATICACIDVEN in the last 168 hours.  No results found for this or any previous visit (from the past 240  hour(s)).       Radiology Studies: No results found.      Scheduled Meds: . aspirin EC  81 mg Oral Daily  . clonazePAM  1 mg Oral BID  . divalproex  1,000 mg Oral QHS  . enoxaparin (LOVENOX) injection  40 mg Subcutaneous Q24H  . insulin aspart  0-5 Units Subcutaneous QHS  . insulin aspart  0-9 Units Subcutaneous TID WC  . lamoTRIgine  150 mg Oral QHS  . linagliptin  5 mg Oral Daily  . oxybutynin  10 mg Oral BID  . pantoprazole  40 mg Oral Daily  . QUEtiapine  100 mg Oral TID  . QUEtiapine  300 mg Oral QHS  . sertraline  50 mg Oral Daily  . simethicone  80 mg Oral TID with meals   Continuous Infusions:   LOS: 21 days    Time spent: 15 mins    Wyvonnia Dusky, MD [facility] Pager [phone]  If 7PM-7AM, please contact night-coverage 08/26/2020, 7:23 AM

## 2020-08-26 NOTE — Progress Notes (Signed)
Physical Therapy Treatment Patient Details Name: Alexander Beard MRN: [id number] DOB: 07-30-1955 Today's Date: 08/26/2020    History of Present Illness 65 y.o. male with medical history significant for schizoaffective disorder, bipolar type, autism spectrum disorder, COPD, T2DM, HTN, HLD who presents to the ED for evaluation of shortness of breath. Covid-19+ on 08/04/20.     Per brother, patient recently had a prolonged behavioral health hospital stay from Aug - Dec 2021 before discharged to group care home in [place] which is a new environment for him.  Brother states that he has noted significant "grogginess" over the last few weeks since medication schedule changes (4x/day to 2x/day).  This has coincided with several falls that have not resulted in any significant injuries.    PT Comments    Pt resting in bed upon PT arrival; agreeable and eager to walk with therapy.  Pt appearing impulsive during session requiring increased amount of cueing for safety. SBA with bed mobility; min assist with transfers; and min assist to ambulate 40 feet with RW.  Pt requiring significant amount of vc's and tactile cues for safety with ambulation this afternoon; pt also requiring significant amount of time to perform mobility (especially ambulation).  Pt assisted back to bed and repositioned for comfort end of session.  Will continue to focus on strengthening and progressive functional mobility during hospitalization.   Follow Up Recommendations  SNF     Equipment Recommendations  Rolling walker with 5" wheels;3in1 (PT)    Recommendations for Other Services       Precautions / Restrictions Precautions Precautions: Fall Restrictions Weight Bearing Restrictions: No    Mobility  Bed Mobility Overal bed mobility: Needs Assistance Bed Mobility: Supine to Sit;Sit to Supine     Supine to sit: Supervision Sit to supine: Supervision   General bed mobility comments: vc's and SBA for safety     Transfers Overall transfer level: Needs assistance Equipment used: Rolling walker (2 wheeled) Transfers: Sit to/from Stand Sit to Stand: Min assist Stand pivot transfers: Min assist (stand step turn bed to/from recliner with RW)       General transfer comment: assist to steady when standing/performing transfers; vc's for safety  Ambulation/Gait Ambulation/Gait assistance: Min assist Gait Distance (Feet): 40 Feet Assistive device: Rolling walker (2 wheeled)   Gait velocity: decreased   General Gait Details: decreased B LE step length; intermittent vc's to not "stomp feet" when advancing LE's; vc's to take longer step lengths; very slow and deliberate steps with pt stopping frequently; vc's for safety and keeping walker on ground when advancing   Stairs             Wheelchair Mobility    Modified Rankin (Stroke Patients Only)       Balance Overall balance assessment: Needs assistance Sitting-balance support: No upper extremity supported;Feet supported Sitting balance-Leahy Scale: Good Sitting balance - Comments: steady sitting reaching outside BOS to donn socks   Standing balance support: Bilateral upper extremity supported;During functional activity Standing balance-Leahy Scale: Poor Standing balance comment: pt shaky standing without UE support requiring assist to steady                            Cognition Arousal/Alertness: Awake/alert Behavior During Therapy: Anxious Overall Cognitive Status: No family/caregiver present to determine baseline cognitive functioning Area of Impairment: Orientation;Attention;Memory;Following commands;Safety/judgement;Awareness;Problem solving                 Orientation Level: Disoriented to;Time;Situation  Current Attention Level: Focused Memory: Decreased recall of precautions;Decreased short-term memory Following Commands: Follows one step commands inconsistently Safety/Judgement: Decreased awareness of  safety;Decreased awareness of deficits Awareness: Intellectual Problem Solving: Slow processing;Requires tactile cues;Requires verbal cues        Exercises      General Comments General comments (skin integrity, edema, etc.): Pt set up with art supplies end of session      Pertinent Vitals/Pain Pain Assessment: No/denies pain  Vitals (HR and O2 on room air) stable and WFL throughout treatment session.    Home Living                      Prior Function            PT Goals (current goals can now be found in the care plan section) Acute Rehab PT Goals Patient Stated Goal: to discharge from hospital PT Goal Formulation: With patient Time For Goal Achievement: 09/04/20 Potential to Achieve Goals: Fair Progress towards PT goals: Progressing toward goals    Frequency    Min 2X/week      PT Plan Current plan remains appropriate    Co-evaluation              AM-PAC PT "6 Clicks" Mobility   Outcome Measure  Help needed turning from your back to your side while in a flat bed without using bedrails?: None Help needed moving from lying on your back to sitting on the side of a flat bed without using bedrails?: A Little Help needed moving to and from a bed to a chair (including a wheelchair)?: A Little Help needed standing up from a chair using your arms (e.g., wheelchair or bedside chair)?: A Little Help needed to walk in hospital room?: A Little Help needed climbing 3-5 steps with a railing? : A Lot 6 Click Score: 18    End of Session Equipment Utilized During Treatment: Gait belt Activity Tolerance: Patient tolerated treatment well Patient left: in bed;with call bell/phone within reach;with bed alarm set Nurse Communication: Mobility status;Precautions PT Visit Diagnosis: Muscle weakness (generalized) (M62.81);Difficulty in walking, not elsewhere classified (R26.2)     Time: 7408-1448 PT Time Calculation (min) (ACUTE ONLY): 48 min  Charges:  $Gait  Training: 8-22 mins $Therapeutic Activity: 23-37 mins                     Leitha Bleak, PT 08/26/20, 4:24 PM

## 2020-08-27 ENCOUNTER — Encounter: Payer: Self-pay | Admitting: Family Medicine

## 2020-08-27 DIAGNOSIS — F250 Schizoaffective disorder, bipolar type: Secondary | ICD-10-CM | POA: Diagnosis not present

## 2020-08-27 DIAGNOSIS — I10 Essential (primary) hypertension: Secondary | ICD-10-CM | POA: Diagnosis not present

## 2020-08-27 DIAGNOSIS — E119 Type 2 diabetes mellitus without complications: Secondary | ICD-10-CM | POA: Diagnosis not present

## 2020-08-27 LAB — GLUCOSE, CAPILLARY
Glucose-Capillary: 95 mg/dL (ref 70–99)
Glucose-Capillary: 135 mg/dL — ABNORMAL HIGH (ref 70–99)
Glucose-Capillary: 121 mg/dL — ABNORMAL HIGH (ref 70–99)
Glucose-Capillary: 106 mg/dL — ABNORMAL HIGH (ref 70–99)

## 2020-08-27 MED ORDER — DOCUSATE SODIUM 100 MG PO CAPS
200.0000 mg | ORAL_CAPSULE | Freq: Two times a day (BID) | ORAL | Status: DC
  Administered 2020-08-27 – 2020-09-07 (×23): 200 mg via ORAL
  Filled 2020-08-27 (×23): qty 2

## 2020-08-27 MED ORDER — POLYETHYLENE GLYCOL 3350 17 G PO PACK
17.0000 g | PACK | Freq: Every day | ORAL | Status: DC
  Administered 2020-08-27 – 2020-09-07 (×11): 17 g via ORAL
  Filled 2020-08-27 (×11): qty 1

## 2020-08-27 NOTE — TOC Progression Note (Signed)
Transition of Care [facility]) - Progression Note    Patient Details  Name: Alexander Beard MRN: [id number] Date of Birth: 1955-10-07  Transition of [facility] CM/SW Contact  Shelbie Hutching, RN Phone Number: 08/27/2020, 1:00 PM  Clinical Narrative:    RNCM called [facility] on Tuesday to inquire about referral sent on 2/11- they were supposed to return call but never did.  RNCM called again today and apparently the admissions person from Friday 2/11 is out this week and they did not receive the referral.  Referral resent- emailed and faxed to [facility] at [place].  RNCM is reaching out to other facilities as well still looking for placement.    Expected Discharge Plan: Group Home Barriers to Discharge: Continued Medical Work up  Expected Discharge Plan and Services Expected Discharge Plan: Group Home   Discharge Planning Services: CM Consult   Living arrangements for the past 2 months: Group Home                                       Social Determinants of Health (SDOH) Interventions    Readmission Risk Interventions Readmission Risk Prevention Plan 08/07/2020  Transportation Screening Complete  Medication Review (RN Care Manager) Complete  PCP or Specialist appointment within 3-5 days of discharge Complete  HRI or Home Care Consult Complete  SW Recovery Care/Counseling Consult Complete  Palliative Care Screening Not [place] Complete  Some recent data might be hidden

## 2020-08-27 NOTE — Progress Notes (Signed)
Occupational Therapy Treatment Patient Details Name: Alexander Beard MRN: [id number] DOB: [birth year] Today's Date: 08/27/2020    History of present illness 65 y.o. male with medical history significant for schizoaffective disorder, bipolar type, autism spectrum disorder, COPD, T2DM, HTN, HLD who presents to the ED for evaluation of shortness of breath. Covid-19+ on 08/04/20.     Per brother, patient recently had a prolonged behavioral health hospital stay from Aug - Dec 2021 before discharged to group care home in [place] which is a new environment for him.  Brother states that he has noted significant "grogginess" over the last few weeks since medication schedule changes (4x/day to 2x/day).  This has coincided with several falls that have not resulted in any significant injuries.   OT comments  Pt received in bed, clothes and linens soiled with urine and feces. Pt repeatedly apologized for his condition, with therapist offering reassurances that he had no reason to apologize. Pt eager to participate in changing bed, washing lower body, wiping perianal area, with therapist providing Mod-Max A but encouraging pt to do all that he was able himself. Demonstrated good bed mobility, rolling from side to side numerous times during bedding change process. Fairly unstable, shaky in standing, able to remain in standing <1 minute before requiring rest break. Hand tremors presented an increased challenge for pt to perform grooming and UB bathing. Recommend continuing OT while pt is hospitalized, to improve strength, endurance, functional mobility, and reduced falls risks. DC to SNF remains appropriate.   Follow Up Recommendations  SNF    Equipment Recommendations       Recommendations for Other Services      Precautions / Restrictions Precautions Precautions: Fall Restrictions Weight Bearing Restrictions: No       Mobility Bed Mobility Overal bed mobility: Needs Assistance Bed Mobility: Supine  to Sit;Sit to Supine;Rolling Rolling: Supervision   Supine to sit: Supervision Sit to supine: Supervision   General bed mobility comments: VCs for safety; inconsistently followed 1-step commands re: rolling in bed for changing bed lines  Transfers Overall transfer level: Needs assistance   Transfers: Sit to/from Stand Sit to Stand: Mod assist              Balance Overall balance assessment: Needs assistance Sitting-balance support: No upper extremity supported;Feet supported Sitting balance-Leahy Scale: Good     Standing balance support: Single extremity supported;During functional activity Standing balance-Leahy Scale: Poor                             ADL either performed or assessed with clinical judgement   ADL Overall ADL's : Needs assistance/impaired     Grooming: Set up;Min guard;Brushing hair Grooming Details (indicate cue type and reason): able to perform grooming sitting EOB, although challenged by tremors     Lower Body Bathing: Moderate assistance;Set up Lower Body Bathing Details (indicate cue type and reason): Able to assist washing lower body from bed, after toileting. Able to perform high bridging and hold Upper Body Dressing : Minimal assistance;Sitting Upper Body Dressing Details (indicate cue type and reason): required cueing re: which arm goes into which sleeve of gown         Toileting- Clothing Manipulation and Hygiene: Moderate assistance Toileting - Clothing Manipulation Details (indicate cue type and reason): able to assist with peri hygenie from standing, with RW             Vision Patient Visual Report: No change from  baseline     Perception     Praxis      Cognition Arousal/Alertness: Awake/alert Behavior During Therapy: Impulsive;Anxious Overall Cognitive Status: History of cognitive impairments - at baseline                   Orientation Level: Disoriented to;Time;Situation Current Attention Level:  Focused Memory: Decreased recall of precautions;Decreased short-term memory Following Commands: Follows one step commands inconsistently Safety/Judgement: Decreased awareness of safety;Decreased awareness of deficits   Problem Solving: Slow processing;Requires tactile cues;Requires verbal cues          Exercises Other Exercises Other Exercises: Bed mobility, transfers, grooming, toileting, dressing   Shoulder Instructions       General Comments      Pertinent Vitals/ Pain       Pain Assessment: No/denies pain  Home Living Family/patient expects to be discharged to:: Group home                                 Additional Comments: Pt has been living at a group home since Dec 2021.      Prior Functioning/Environment Level of Independence: Needs assistance  Gait / Transfers Assistance Needed: Per previous therapy evaluation "Per brother, pt was not using any DME for functional mobility prior to his Aug-Dec hospitalization, and is unsure how pt has been moving since last admission. Pt's brother reports that pt has LE neuropathy at baseline, which has contributed to his hx of falls. Reports that he has fallen much more frequently since living in the group home"         Frequency  Min 2X/week        Progress Toward Goals  OT Goals(current goals can now be found in the care plan section)  Progress towards OT goals: Progressing toward goals  Acute Rehab OT Goals Patient Stated Goal: to discharge from hospital OT Goal Formulation: With patient Time For Goal Achievement: 09/03/20 Potential to Achieve Goals: Good  Plan Discharge plan remains appropriate;Frequency remains appropriate    Co-evaluation                 AM-PAC OT "6 Clicks" Daily Activity     Outcome Measure   Help from another person eating meals?: A Little Help from another person taking care of personal grooming?: A Little Help from another person toileting, which includes using  toliet, bedpan, or urinal?: A Lot Help from another person bathing (including washing, rinsing, drying)?: A Lot Help from another person to put on and taking off regular upper body clothing?: A Little Help from another person to put on and taking off regular lower body clothing?: A Lot 6 Click Score: 15    End of Session    OT Visit Diagnosis: Unsteadiness on feet (R26.81);History of falling (Z91.81);Muscle weakness (generalized) (M62.81);Other symptoms and signs involving cognitive function   Activity Tolerance Patient tolerated treatment well   Patient Left in bed;with call bell/phone within reach;with nursing/sitter in room   Nurse Communication          Time: 1510-1534 OT Time Calculation (min): 24 min  Charges: OT General Charges $OT Visit: 1 Visit OT Treatments $Self Care/Home Management : 23-37 mins   Josiah Lobo, PhD, [place], OTR/L ascom [phone] 08/27/20, 4:23 PM

## 2020-08-27 NOTE — Plan of Care (Signed)
Pt was severely distressed last night thinking he was going to get sent back to the [facility] where he was mistreated.  [name] he ran away and tried to tell the police but they did nothing.  We can't send him back to a place where he was knowingly mistreated.  Admitting nurse, Regino Schultze, RN will verify that she saw bruises all over him in various states of healing on admission.  He has told this story repeatedly to numerous people.

## 2020-08-27 NOTE — Progress Notes (Signed)
PROGRESS NOTE    Alexander Beard  KDX:[id number] DOB: 06/12/1956 DOA: 08/04/2020 PCP: Remi Haggard, FNP  Assessment & Plan:   Principal Problem:   Pneumonia due to COVID-19 virus Active Problems:   Schizoaffective disorder, bipolar type ([place])   Hypertension   Controlled type 2 diabetes mellitus without complication ([place])   Encephalopathy acute   Pneumonia  Acute hypoxic respiratory failure: resolved. Completed remdesivir & steroid courses. Secondary to COVID19 pneumonia   Acute metabolic encephalopathy: likely overmedicated, dose has been reduced. Mental status waxes and wanes   Schizoaffective disorder: long term group resident. Continue on seroquel, lamictal, zoloft & clonoazepam.  Hypokalemia: will check labs tomorrow   Thrombocytopenia: resolved  DM2: continue on tradjenta, SSI w/ accuchecks. Well controlled        DVT prophylaxis: lovenox  Code Status: full  Family Communication:  Disposition Plan: PT/OT recs SNF, still waiting on SNF placement   Level of care: Med-Surg   Status is: Inpatient  Remains inpatient appropriate because:Unsafe d/c plan   Dispo:  Patient From: home   Planned Disposition: whenever SNF bed is available  Expected discharge date: 08/29/2020  Medically stable for discharge: yes      Consultants:      Procedures:   Antimicrobials:   Subjective: Pt c/o fatigue   Objective: Vitals:   08/26/20 2044 08/27/20 0016 08/27/20 0439 08/27/20 0731  BP: 133/75 104/74 116/81 102/75  Pulse: 66 77 71 72  Resp: 18 18 20 20   Temp: 99.4 F (37.4 C) 98.6 F (37 C) (!) 97.5 F (36.4 C) 98.4 F (36.9 C)  TempSrc:  Oral Oral   SpO2: 92% 93% 99% 98%  Weight:      Height:        Intake/Output Summary (Last 24 hours) at 08/27/2020 0732 Last data filed at 08/27/2020 0023 Gross per 24 hour  Intake 240 ml  Output 200 ml  Net 40 ml   Filed Weights   08/18/20 2304  Weight: 104 kg    Examination:  General exam:  Appears  comfortable  Respiratory system: clear breath sounds b/l Cardiovascular system: S1 & S2+. No rubs or gallops   Gastrointestinal system: abd is soft, NT, ND & normal bowel sounds  Central nervous system: Alert and awake. Moves all 4 extremities  Psychiatry: Judgement and insight appear abnormal. Flat mood and affect     Data Reviewed: I have personally reviewed following labs and imaging studies  CBC: Recent Labs  Lab 08/21/20 0651  WBC 4.3  HGB 11.8*  HCT 36.3*  MCV 94.8  PLT 976   Basic Metabolic Panel: No results for input(s): NA, K, CL, CO2, GLUCOSE, BUN, CREATININE, CALCIUM, MG, PHOS in the last 168 hours. GFR: Estimated Creatinine Clearance: 102.6 mL/min (by C-G formula based on SCr of 0.85 mg/dL). Liver Function Tests: No results for input(s): AST, ALT, ALKPHOS, BILITOT, PROT, ALBUMIN in the last 168 hours. No results for input(s): LIPASE, AMYLASE in the last 168 hours. No results for input(s): AMMONIA in the last 168 hours. Coagulation Profile: No results for input(s): INR, PROTIME in the last 168 hours. Cardiac Enzymes: No results for input(s): CKTOTAL, CKMB, CKMBINDEX, TROPONINI in the last 168 hours. BNP (last 3 results) No results for input(s): PROBNP in the last 8760 hours. HbA1C: No results for input(s): HGBA1C in the last 72 hours. CBG: Recent Labs  Lab 08/25/20 1630 08/25/20 2117 08/26/20 0726 08/26/20 1124 08/26/20 2044  GLUCAP 123* 150* 82 184* 149*   Lipid Profile:  No results for input(s): CHOL, HDL, LDLCALC, TRIG, CHOLHDL, LDLDIRECT in the last 72 hours. Thyroid Function Tests: No results for input(s): TSH, T4TOTAL, FREET4, T3FREE, THYROIDAB in the last 72 hours. Anemia Panel: No results for input(s): VITAMINB12, FOLATE, FERRITIN, TIBC, IRON, RETICCTPCT in the last 72 hours. Sepsis Labs: No results for input(s): PROCALCITON, LATICACIDVEN in the last 168 hours.  No results found for this or any previous visit (from the past 240 hour(s)).        Radiology Studies: No results found.      Scheduled Meds: . aspirin EC  81 mg Oral Daily  . clonazePAM  1 mg Oral BID  . divalproex  1,000 mg Oral QHS  . enoxaparin (LOVENOX) injection  40 mg Subcutaneous Q24H  . insulin aspart  0-5 Units Subcutaneous QHS  . insulin aspart  0-9 Units Subcutaneous TID WC  . lamoTRIgine  150 mg Oral QHS  . linagliptin  5 mg Oral Daily  . oxybutynin  10 mg Oral BID  . pantoprazole  40 mg Oral Daily  . QUEtiapine  100 mg Oral TID  . QUEtiapine  300 mg Oral QHS  . sertraline  50 mg Oral Daily  . simethicone  80 mg Oral TID with meals   Continuous Infusions:   LOS: 22 days    Time spent: 15 mins    Wyvonnia Dusky, MD [facility] Pager [phone]  If 7PM-7AM, please contact night-coverage 08/27/2020, 7:32 AM

## 2020-08-27 NOTE — Progress Notes (Signed)
Physical Therapy Treatment Patient Details Name: Alexander Beard MRN: [id number] DOB: 10-12-1955 Today's Date: 08/27/2020    History of Present Illness 65 y.o. male with medical history significant for schizoaffective disorder, bipolar type, autism spectrum disorder, COPD, T2DM, HTN, HLD who presents to the ED for evaluation of shortness of breath. Covid-19+ on 08/04/20.     Per brother, patient recently had a prolonged behavioral health hospital stay from Aug - Dec 2021 before discharged to group care home in [place] which is a new environment for him.  Brother states that he has noted significant "grogginess" over the last few weeks since medication schedule changes (4x/day to 2x/day).  This has coincided with several falls that have not resulted in any significant injuries.    PT Comments    Pt just finishing working with OT upon PT arrival; pt agreeable to walking.  SBA with bed mobility (pt initially confused with cues for getting OOB but with therapist rephrasing request to get OOB pt able to sit up on edge of bed on own); CGA with transfers (pt did well transferring with RW bed to recliner but required cueing for safety/walker use transferring recliner back to bed); and CGA to min assist to ambulate 80 feet with RW (pt initially with steady step to gait pattern with pt picking up walker to advance each time but required vc's for gait technique and safety turning corner in hallway and last 10 feet--see below for details).  Pt progressing with functional mobility but still requires 1 assist for balance and for safety.   Follow Up Recommendations  SNF     Equipment Recommendations  Rolling walker with 5" wheels;3in1 (PT)    Recommendations for Other Services       Precautions / Restrictions Precautions Precautions: Fall Restrictions Weight Bearing Restrictions: No    Mobility  Bed Mobility Overal bed mobility: Needs Assistance Bed Mobility: Supine to Sit;Sit to Supine Rolling:  Supervision   Supine to sit: Supervision Sit to supine: Supervision   General bed mobility comments: vc's for safety getting out of bed    Transfers Overall transfer level: Needs assistance Equipment used: Rolling walker (2 wheeled) Transfers: Sit to/from Stand Sit to Stand: Min guard (x1 trial from bed and x2 trials from recliner) Stand pivot transfers: Min guard (stand step turn bed to/from recliner with RW)       General transfer comment: pt demonstrating good technique transferring bed to recliner but requiring vc's for walker and positioning transferring chair to bed  Ambulation/Gait Ambulation/Gait assistance: Min guard;Min assist Gait Distance (Feet): 80 Feet Assistive device: Rolling walker (2 wheeled)   Gait velocity: decreased   General Gait Details: pt picking up walker to advance with step to pattern first 40 feet; vc's required for walker use and positioning turning corner but then transitioned back to prior gait pattern for next 30 feet; last 10 feet (after pt cued on stopping point) pt with more of a "stomping" gait requiring vc's for gait technique and walker use   Stairs             Wheelchair Mobility    Modified Rankin (Stroke Patients Only)       Balance Overall balance assessment: Needs assistance Sitting-balance support: No upper extremity supported;Feet supported Sitting balance-Leahy Scale: Good Sitting balance - Comments: steady sitting reaching outside BOS   Standing balance support: Single extremity supported;During functional activity Standing balance-Leahy Scale: Poor Standing balance comment: pt requiring at least single UE support for static standing balance  Cognition Arousal/Alertness: Awake/alert Behavior During Therapy: Impulsive;Anxious Overall Cognitive Status: No family/caregiver present to determine baseline cognitive functioning Area of Impairment:  Orientation;Attention;Memory;Following commands;Safety/judgement;Awareness;Problem solving                 Orientation Level: Disoriented to;Time;Situation Current Attention Level: Focused Memory: Decreased recall of precautions;Decreased short-term memory Following Commands: Follows one step commands inconsistently Safety/Judgement: Decreased awareness of safety;Decreased awareness of deficits Awareness: Intellectual Problem Solving: Slow processing;Requires tactile cues;Requires verbal cues        Exercises    General Comments General comments (skin integrity, edema, etc.): pt set up with art supplies end of session      Pertinent Vitals/Pain Pain Assessment: No/denies pain  Vitals (HR and O2 on room air) stable and WFL throughout treatment session.    Home Living                 Prior Function Level of Independence: Needs assistance        PT Goals (current goals can now be found in the care plan section) Acute Rehab PT Goals Patient Stated Goal: to discharge from hospital PT Goal Formulation: With patient Time For Goal Achievement: 09/04/20 Potential to Achieve Goals: Fair Progress towards PT goals: Progressing toward goals    Frequency    Min 2X/week      PT Plan Current plan remains appropriate    Co-evaluation              AM-PAC PT "6 Clicks" Mobility   Outcome Measure  Help needed turning from your back to your side while in a flat bed without using bedrails?: None Help needed moving from lying on your back to sitting on the side of a flat bed without using bedrails?: A Little Help needed moving to and from a bed to a chair (including a wheelchair)?: A Little Help needed standing up from a chair using your arms (e.g., wheelchair or bedside chair)?: A Little Help needed to walk in hospital room?: A Little Help needed climbing 3-5 steps with a railing? : A Lot 6 Click Score: 18    End of Session Equipment Utilized During Treatment:  Gait belt Activity Tolerance: Patient tolerated treatment well Patient left: in bed;with call bell/phone within reach;with bed alarm set;Other (comment) (bed in lowest position with fall mat in place; ALF representative present) Nurse Communication: Mobility status;Precautions;Other (comment) (ALF representative present with pt) PT Visit Diagnosis: Muscle weakness (generalized) (M62.81);Difficulty in walking, not elsewhere classified (R26.2)     Time: 4650-3546 PT Time Calculation (min) (ACUTE ONLY): 38 min  Charges:  $Gait Training: 23-37 mins $Therapeutic Activity: 8-22 mins                    Leitha Bleak, PT 08/27/20, 4:59 PM

## 2020-08-27 NOTE — TOC Progression Note (Signed)
Transition of Care [facility]) - Progression Note    Patient Details  Name: Alexander Beard MRN: [id number] Date of Birth: Oct 12, 1955  Transition of Care [facility]) CM/SW Contact  Shelbie Hutching, RN Phone Number: 08/27/2020, 2:52 PM  Clinical Narrative:    Representative from [facility] will be coming by in the morning between 9am and 10 am to interview with the patient to see if he may be a good candidate for their facility.     Expected Discharge Plan: Group Home Barriers to Discharge: Continued Medical Work up  Expected Discharge Plan and Services Expected Discharge Plan: Group Home   Discharge Planning Services: CM Consult   Living arrangements for the past 2 months: Group Home                                       Social Determinants of Health (SDOH) Interventions    Readmission Risk Interventions Readmission Risk Prevention Plan 08/07/2020  Transportation Screening Complete  Medication Review (RN Care Manager) Complete  PCP or Specialist appointment within 3-5 days of discharge Complete  HRI or Home Care Consult Complete  SW Recovery Care/Counseling Consult Complete  Palliative Care Screening Not [place] Complete  Some recent data might be hidden

## 2020-08-28 DIAGNOSIS — F250 Schizoaffective disorder, bipolar type: Secondary | ICD-10-CM | POA: Diagnosis not present

## 2020-08-28 DIAGNOSIS — I10 Essential (primary) hypertension: Secondary | ICD-10-CM | POA: Diagnosis not present

## 2020-08-28 DIAGNOSIS — E119 Type 2 diabetes mellitus without complications: Secondary | ICD-10-CM | POA: Diagnosis not present

## 2020-08-28 LAB — BASIC METABOLIC PANEL
Anion gap: 8 (ref 5–15)
BUN: 13 mg/dL (ref 8–23)
CO2: 30 mmol/L (ref 22–32)
Calcium: 9.3 mg/dL (ref 8.9–10.3)
Chloride: 104 mmol/L (ref 98–111)
Creatinine, Ser: 0.72 mg/dL (ref 0.61–1.24)
GFR, Estimated: >60 mL/min (ref >60)
Glucose, Bld: 128 mg/dL — ABNORMAL HIGH (ref 70–99)
Potassium: 4.5 mmol/L (ref 3.5–5.1)
Sodium: 142 mmol/L (ref 135–145)

## 2020-08-28 LAB — CBC
HCT: 40.1 % (ref 39.0–52.0)
Hemoglobin: 13.2 g/dL (ref 13.0–17.0)
MCH: 31.6 pg (ref 26.0–34.0)
MCHC: 32.9 g/dL (ref 30.0–36.0)
MCV: 95.9 fL (ref 80.0–100.0)
Platelets: 126 10*3/uL — ABNORMAL LOW (ref 150–400)
RBC: 4.18 MIL/uL — ABNORMAL LOW (ref 4.22–5.81)
RDW: 14.6 % (ref 11.5–15.5)
WBC: 3.5 10*3/uL — ABNORMAL LOW (ref 4.0–10.5)
nRBC: 0 % (ref 0.0–0.2)

## 2020-08-28 LAB — GLUCOSE, CAPILLARY
Glucose-Capillary: 103 mg/dL — ABNORMAL HIGH (ref 70–99)
Glucose-Capillary: 135 mg/dL — ABNORMAL HIGH (ref 70–99)
Glucose-Capillary: 201 mg/dL — ABNORMAL HIGH (ref 70–99)
Glucose-Capillary: 142 mg/dL — ABNORMAL HIGH (ref 70–99)

## 2020-08-28 MED ORDER — QUETIAPINE FUMARATE 200 MG PO TABS
400.0000 mg | ORAL_TABLET | Freq: Every day | ORAL | Status: DC
  Administered 2020-08-28 – 2020-09-08 (×12): 400 mg via ORAL
  Filled 2020-08-28 (×13): qty 2

## 2020-08-28 MED ORDER — SERTRALINE HCL 50 MG PO TABS
50.0000 mg | ORAL_TABLET | Freq: Two times a day (BID) | ORAL | Status: DC
  Administered 2020-08-28 – 2020-09-09 (×24): 50 mg via ORAL
  Filled 2020-08-28 (×24): qty 1

## 2020-08-28 NOTE — Progress Notes (Signed)
PROGRESS NOTE   HPI was taken from Dr. Zada Finders: Alexander Beard is a 65 y.o. male with medical history significant for schizoaffective disorder, bipolar type, autism spectrum disorder, COPD, T2DM, HTN, HLD who presents to the ED for evaluation of shortness of breath.  Unable to obtain history from patient due to excessive somnolence and is otherwise obtained from EDP, chart review, and brother by phone.  Per ED documentation, patient was brought to ED via EMS who on their arrival noted patient SPO2 was 92% on room air with coarse crackles in the lungs.  He was placed on 4 L O2 via [place] on route to the ED.  On arrival patient was reportedly alert and interactive however following triage and initial labs he had a change in mental status described as a mumbling speech pattern and minimally responsive.  Spoke with patient's brother by phone.  His brother says that patient recently had a prolonged behavioral health hospital stay from August to December before he was discharged to group care home in [place] which is a new environment for him.  Brother states that previously patient's medication schedule was 4 times daily but since he left the hospital is now taking meds twice daily.  Brother states that he has noted significant "grogginess" over the last few weeks.  This has coincided with several falls that have not resulted in any significant injuries.  ED Course:  Initial vitals showed BP 104/65, pulse 68, RR 18, temp 99.1 F, SPO2 96% on room air.  Labs showed sodium 140, potassium 4.0, bicarb 28, BUN 13, creatinine 0.68, serum glucose 105, WBC 4.0, hemoglobin 14.1, platelets 105,000, high-sensitivity troponin I 4x2, procalcitonin <0.10, lactic acid 2.3, valproic acid level 92, ammonia 28.  VBG showed pH 7.39, PCO2 62.  Urinalysis shows negative nitrates, negative leukocytes, 0-5 RBCs and WBCs/hpf, rare bacteria microscopy.  UDS positive for tricyclic's and benzodiazepines.  Blood cultures  obtained and pending.  SARS-CoV-2 PCR is positive.  2 view chest x-ray shows airspace opacity at the left lung base.  CT head without contrast is negative for acute intracranial abnormality.  Atrophy, chronic microvascular disease noted.  CT cervical spine without contrast shows diffuse degenerative disc and facet disease without acute bony abnormality.  Patient was given 1 L LR and the hospitalist service was consulted to admit for further evaluation and management.   Hospital Course from Dr. Jimmye Norman 2/9-2/18/22: Pt completed remdesivir and steroid course for COVID19 pneumonia. Pt also has been weaned off of oxygen. CM is having trouble with placement issues for the pt. PT/OT recs SNF. Will have psych evaluate.    Alexander Beard  OFB:[id number] DOB: Jun 21, 1956 DOA: 08/04/2020 PCP: Remi Haggard, FNP  Assessment & Plan:   Principal Problem:   Pneumonia due to COVID-19 virus Active Problems:   Schizoaffective disorder, bipolar type ([place])   Hypertension   Controlled type 2 diabetes mellitus without complication ([place])   Encephalopathy acute   Pneumonia  Acute hypoxic respiratory failure: resolved. Completed remdesivir & steroid courses. Secondary to COVID19 pneumonia   Acute metabolic encephalopathy: likely overmedicated, dose has been reduced. Mental status waxes and wanes   Schizoaffective disorder: long term group resident. Continue on seroquel, lamictal, zoloft & clonoazepam.  Hypokalemia: WNL today   Thrombocytopenia: etiology unclear, labile.   DM2: continue on tradjenta, SSI w/ accuchecks. Well controlled        DVT prophylaxis: lovenox  Code Status: full  Family Communication:  Disposition Plan: PT/OT recs SNF, still waiting on  SNF placement   Level of care: Med-Surg   Status is: Inpatient  Remains inpatient appropriate because:Unsafe d/c plan   Dispo:  Patient From: home   Planned Disposition: whenever SNF bed or other facility is  available  Expected discharge date: 08/29/2020  Medically stable for discharge: yes      Consultants:      Procedures:   Antimicrobials:   Subjective: Pt denies any complaints  Objective: Vitals:   08/27/20 1627 08/27/20 1927 08/28/20 0006 08/28/20 0521  BP: 132/90 128/77 118/69 109/79  Pulse: 86 81 69 72  Resp: 20 20 17 16   Temp: 97.8 F (36.6 C) 97.8 F (36.6 C) 98 F (36.7 C) (!) 97.4 F (36.3 C)  TempSrc:      SpO2: 98% 100% 93% 94%  Weight:      Height:        Intake/Output Summary (Last 24 hours) at 08/28/2020 0741 Last data filed at 08/27/2020 1902 Gross per 24 hour  Intake 600 ml  Output 725 ml  Net -125 ml   Filed Weights   08/18/20 2304  Weight: 104 kg    Examination:  General exam:  Appears calm and comfortable Respiratory system: clear breath sounds b/l  Cardiovascular system: S1/S2+. No rubs or gallops  Gastrointestinal system: abd is soft, NT, ND & normal bowel sounds  Central nervous system: Alert and awake. Moves all 4 extremities  Psychiatry: Judgement and insight appear abnormal. Flat mood and affect     Data Reviewed: I have personally reviewed following labs and imaging studies  CBC: Recent Labs  Lab 08/28/20 0446  WBC 3.5*  HGB 13.2  HCT 40.1  MCV 95.9  PLT 027*   Basic Metabolic Panel: Recent Labs  Lab 08/28/20 0446  NA 142  K 4.5  CL 104  CO2 30  GLUCOSE 128*  BUN 13  CREATININE 0.72  CALCIUM 9.3   GFR: Estimated Creatinine Clearance: 109 mL/min (by C-G formula based on SCr of 0.72 mg/dL). Liver Function Tests: No results for input(s): AST, ALT, ALKPHOS, BILITOT, PROT, ALBUMIN in the last 168 hours. No results for input(s): LIPASE, AMYLASE in the last 168 hours. No results for input(s): AMMONIA in the last 168 hours. Coagulation Profile: No results for input(s): INR, PROTIME in the last 168 hours. Cardiac Enzymes: No results for input(s): CKTOTAL, CKMB, CKMBINDEX, TROPONINI in the last 168 hours. BNP (last  3 results) No results for input(s): PROBNP in the last 8760 hours. HbA1C: No results for input(s): HGBA1C in the last 72 hours. CBG: Recent Labs  Lab 08/26/20 2044 08/27/20 0808 08/27/20 1222 08/27/20 1643 08/27/20 2100  GLUCAP 149* 95 135* 121* 106*   Lipid Profile: No results for input(s): CHOL, HDL, LDLCALC, TRIG, CHOLHDL, LDLDIRECT in the last 72 hours. Thyroid Function Tests: No results for input(s): TSH, T4TOTAL, FREET4, T3FREE, THYROIDAB in the last 72 hours. Anemia Panel: No results for input(s): VITAMINB12, FOLATE, FERRITIN, TIBC, IRON, RETICCTPCT in the last 72 hours. Sepsis Labs: No results for input(s): PROCALCITON, LATICACIDVEN in the last 168 hours.  No results found for this or any previous visit (from the past 240 hour(s)).       Radiology Studies: No results found.      Scheduled Meds: . aspirin EC  81 mg Oral Daily  . clonazePAM  1 mg Oral BID  . divalproex  1,000 mg Oral QHS  . docusate sodium  200 mg Oral BID  . enoxaparin (LOVENOX) injection  40 mg Subcutaneous Q24H  .  insulin aspart  0-5 Units Subcutaneous QHS  . insulin aspart  0-9 Units Subcutaneous TID WC  . lamoTRIgine  150 mg Oral QHS  . linagliptin  5 mg Oral Daily  . oxybutynin  10 mg Oral BID  . pantoprazole  40 mg Oral Daily  . polyethylene glycol  17 g Oral Daily  . QUEtiapine  100 mg Oral TID  . QUEtiapine  300 mg Oral QHS  . sertraline  50 mg Oral Daily  . simethicone  80 mg Oral TID with meals   Continuous Infusions:   LOS: 23 days    Time spent: 15 mins    Wyvonnia Dusky, MD [facility] Pager [phone]  If 7PM-7AM, please contact night-coverage 08/28/2020, 7:41 AM

## 2020-08-28 NOTE — Progress Notes (Signed)
Occupational Therapy Treatment Patient Details Name: Alexander Beard MRN: [id number] DOB: 06/07/1956 Today's Date: 08/28/2020    History of present illness 65 y.o. male with medical history significant for schizoaffective disorder, bipolar type, autism spectrum disorder, COPD, T2DM, HTN, HLD who presents to the ED for evaluation of shortness of breath. Covid-19+ on 08/04/20.  Per brother, patient recently had a prolonged behavioral health hospital stay from Aug - Dec 2021 before discharged to group care home in [place] which is a new environment for him.  Brother states that he has noted significant "grogginess" over the last few weeks since medication schedule changes (4x/day to 2x/day).  This has coincided with several falls that have not resulted in any significant injuries.   OT comments  Pt seen for OT treatment on this date. Upon arrival, chair alarm going off, with pt making attempt to get out of bed to use BSC. Pt agreeable for OT to assist. Pt required MOD A for steadying and multi-modal cues for body positioning during stand pivot transfer to [facility] without AD. Pt required MOD A for toilet hygiene; pt able to perform sit>stand posterior peri-care, however required physical assist for steadying and clothing management. Pt able to wash hands with washcloth while standing, requiring MIN A for steadying and set-up. Pt required MIN A for steadying during stand pivot transfer to chair, with MAX verbal/tactile cues for RW management. Pt assisted with sequencing task of calling meal service to request food. Pt making good progress toward goals and continues to benefit from skilled OT services to maximize return to PLOF and minimize risk of future falls, injury, caregiver burden, and readmission. Will continue to follow POC. Discharge recommendation remains appropriate.    Follow Up Recommendations  SNF    Equipment Recommendations  3 in 1 bedside commode       Precautions / Restrictions  Precautions Precautions: Fall Restrictions Weight Bearing Restrictions: No       Mobility Bed Mobility               General bed mobility comments: Deferred (pt in recliner beginning/end of session)  Transfers Overall transfer level: Needs assistance Equipment used: Rolling walker (2 wheeled) Transfers: Sit to/from [facility] Sit to Stand: Min assist Stand pivot transfers: Min assist (stand step turn from [facility] to recliner with RW)       General transfer comment: fairly strong stand; CGA for safety    Balance Overall balance assessment: Needs assistance Sitting-balance support: No upper extremity supported;Feet supported Sitting balance-Leahy Scale: Good Sitting balance - Comments: steady sitting reaching outside BOS while on BSC   Standing balance support: No upper extremity supported;During functional activity Standing balance-Leahy Scale: Poor Standing balance comment: To wash hands with washcloth while standing                           ADL either performed or assessed with clinical judgement   ADL Overall ADL's : Needs assistance/impaired     Grooming: Wash/dry hands;Minimal assistance;Standing Grooming Details (indicate cue type and reason): MIN A for steadying and set-up while washing hands with washcloth                 Toilet Transfer: Moderate assistance;Ambulation;BSC;Minimal assistance;RW Toilet Transfer Details (indicate cue type and reason): Upon arrival, chair alarm going off, with pt making attempt to get out of bed to use BSC. Pt required MOD A for steadying and multi-modal cues for body positioning during stand  pivot transfer to [facility] without AD. Pt required MIN A for steadying during stand pivot transfer to chair, with MAX verbal/tactile cues for RW management Toileting- Clothing Manipulation and Hygiene: Moderate assistance Toileting - Clothing Manipulation Details (indicate cue type and reason): Pt able to assist  with peri-care hygiene. Required physical assist for steadying and clothing management     Functional mobility during ADLs: Moderate assistance;Rolling walker;Cueing for safety;Cueing for sequencing                 Cognition Arousal/Alertness: Awake/alert Behavior During Therapy: Impulsive;Anxious Overall Cognitive Status: No family/caregiver present to determine baseline cognitive functioning Area of Impairment: Orientation;Attention;Memory;Following commands;Safety/judgement;Awareness;Problem solving                 Orientation Level: Disoriented to;Time Current Attention Level: Focused Memory: Decreased recall of precautions;Decreased short-term memory Following Commands: Follows one step commands inconsistently Safety/Judgement: Decreased awareness of safety;Decreased awareness of deficits Awareness: Intellectual Problem Solving: Slow processing;Requires tactile cues;Requires verbal cues General Comments: Alert and oriented to self, place, and situation; disoriented to date. Required multi-model cues t/o for motor planning sequencing        Exercises Other Exercises Other Exercises: Assisted with sequencing task of calling meal service and requesting food      General Comments pt set up with lunch tray end of session with art supplies within reach    Pertinent Vitals/ Pain       Pain Assessment: No/denies pain         Frequency  Min 2X/week        Progress Toward Goals  OT Goals(current goals can now be found in the care plan section)  Progress towards OT goals: Progressing toward goals  Acute Rehab OT Goals Patient Stated Goal: to discharge from hospital OT Goal Formulation: With patient Time For Goal Achievement: 09/03/20 Potential to Achieve Goals: Good  Plan Discharge plan remains appropriate;Frequency remains appropriate       AM-PAC OT "6 Clicks" Daily Activity     Outcome Measure   Help from another person eating meals?: A Little Help  from another person taking care of personal grooming?: A Little Help from another person toileting, which includes using toliet, bedpan, or urinal?: A Lot Help from another person bathing (including washing, rinsing, drying)?: A Lot Help from another person to put on and taking off regular upper body clothing?: A Little Help from another person to put on and taking off regular lower body clothing?: A Lot 6 Click Score: 15    End of Session Equipment Utilized During Treatment: Gait belt;Rolling walker  OT Visit Diagnosis: Unsteadiness on feet (R26.81);History of falling (Z91.81);Muscle weakness (generalized) (M62.81);Other symptoms and signs involving cognitive function   Activity Tolerance Patient tolerated treatment well   Patient Left in chair;with call bell/phone within reach;with chair alarm set   Nurse Communication Mobility status        Time: 1434-1500 OT Time Calculation (min): 26 min  Charges: OT General Charges $OT Visit: 1 Visit OT Treatments $Self Care/Home Management : 8-22 mins $Therapeutic Activity: 8-22 mins  Fredirick Maudlin, OTR/L [place]

## 2020-08-28 NOTE — Plan of Care (Signed)
  Problem: Clinical Measurements: Goal: Ability to maintain clinical measurements within normal limits will improve Outcome: Progressing Goal: Will remain free from infection Outcome: Progressing Goal: Diagnostic test results will improve Outcome: Progressing Goal: Respiratory complications will improve Outcome: Progressing Goal: Cardiovascular complication will be avoided Outcome: Progressing   Problem: Activity: Goal: Risk for activity intolerance will decrease Outcome: Progressing   Problem: Coping: Goal: Level of anxiety will decrease Outcome: Progressing   Problem: Pain Managment: Goal: General experience of comfort will improve Outcome: Progressing   Problem: Safety: Goal: Ability to remain free from injury will improve Outcome: Progressing   Problem: Skin Integrity: Goal: Risk for impaired skin integrity will decrease Outcome: Progressing

## 2020-08-28 NOTE — Progress Notes (Signed)
Physical Therapy Treatment Patient Details Name: Alexander Beard MRN: [id number] DOB: 11-22-1955 Today's Date: 08/28/2020    History of Present Illness 65 y.o. male with medical history significant for schizoaffective disorder, bipolar type, autism spectrum disorder, COPD, T2DM, HTN, HLD who presents to the ED for evaluation of shortness of breath. Covid-19+ on 08/04/20.     Per brother, patient recently had a prolonged behavioral health hospital stay from Aug - Dec 2021 before discharged to group care home in [place] which is a new environment for him.  Brother states that he has noted significant "grogginess" over the last few weeks since medication schedule changes (4x/day to 2x/day).  This has coincided with several falls that have not resulted in any significant injuries.    PT Comments    Pt resting in recliner upon PT arrival; agreeable to PT session.  Pt demonstrating various gait impairments during ambulation and often changing gait technique on own requiring vc's and assist for safety (pt ambulated 30 feet and then 20 feet with RW).  Will continue to focus on strengthening, balance, and progressive functional mobility during hospitalization.    Follow Up Recommendations  SNF     Equipment Recommendations  Rolling walker with 5" wheels;3in1 (PT)    Recommendations for Other Services       Precautions / Restrictions Precautions Precautions: Fall Restrictions Weight Bearing Restrictions: No    Mobility  Bed Mobility               General bed mobility comments: Deferred (pt in recliner beginning/end of session)    Transfers Overall transfer level: Needs assistance Equipment used: Rolling walker (2 wheeled) Transfers: Sit to/from Stand Sit to Stand: Min guard (x3 trials from recliner)         General transfer comment: fairly strong stand; CGA for safety  Ambulation/Gait Ambulation/Gait assistance: Min guard;Min assist Gait Distance (Feet):  (30 feet; 20  feet) Assistive device: Rolling walker (2 wheeled)   Gait velocity: decreased   General Gait Details: pt at times picking up walker to advance with step to pattern; other times pt picking up feet and "stomping" to advance LE's; other times pt with B flexed knees just prior to taking steps forward; vc's for safety required (limited distance able to ambulate safely)   Stairs             Wheelchair Mobility    Modified Rankin (Stroke Patients Only)       Balance Overall balance assessment: Needs assistance Sitting-balance support: No upper extremity supported;Feet supported Sitting balance-Leahy Scale: Good Sitting balance - Comments: steady sitting reaching outside BOS   Standing balance support: Single extremity supported;During functional activity Standing balance-Leahy Scale: Poor Standing balance comment: pt requiring at least single UE support for static standing balance                            Cognition Arousal/Alertness: Awake/alert Behavior During Therapy: Impulsive;Anxious Overall Cognitive Status: No family/caregiver present to determine baseline cognitive functioning Area of Impairment: Orientation;Attention;Memory;Following commands;Safety/judgement;Awareness;Problem solving                 Orientation Level: Disoriented to;Time;Situation Current Attention Level: Focused Memory: Decreased recall of precautions;Decreased short-term memory Following Commands: Follows one step commands inconsistently Safety/Judgement: Decreased awareness of safety;Decreased awareness of deficits Awareness: Intellectual Problem Solving: Slow processing;Requires tactile cues;Requires verbal cues        Exercises      General Comments General comments (  skin integrity, edema, etc.): pt set up with lunch tray end of session with art supplies within reach      Pertinent Vitals/Pain Pain Assessment: No/denies pain  Vitals (HR and O2 on room air) stable  and WFL throughout treatment session.    Home Living                      Prior Function            PT Goals (current goals can now be found in the care plan section) Acute Rehab PT Goals Patient Stated Goal: to discharge from hospital PT Goal Formulation: With patient Time For Goal Achievement: 09/04/20 Potential to Achieve Goals: Fair Progress towards PT goals: Progressing toward goals    Frequency    Min 2X/week      PT Plan Current plan remains appropriate    Co-evaluation              AM-PAC PT "6 Clicks" Mobility   Outcome Measure  Help needed turning from your back to your side while in a flat bed without using bedrails?: None Help needed moving from lying on your back to sitting on the side of a flat bed without using bedrails?: A Little Help needed moving to and from a bed to a chair (including a wheelchair)?: A Little Help needed standing up from a chair using your arms (e.g., wheelchair or bedside chair)?: A Little Help needed to walk in hospital room?: A Little Help needed climbing 3-5 steps with a railing? : A Lot 6 Click Score: 18    End of Session Equipment Utilized During Treatment: Gait belt Activity Tolerance: Patient tolerated treatment well Patient left: in chair;with call bell/phone within reach;with chair alarm set;Other (comment) (chair alarm lap belt set (therapist showed pt how to take off lap belt and pt able to demonstrate how to remove)) Nurse Communication: Mobility status;Precautions;Other (comment) (lap belt chair alarm set up) PT Visit Diagnosis: Muscle weakness (generalized) (M62.81);Difficulty in walking, not elsewhere classified (R26.2)     Time: 9741-6384 PT Time Calculation (min) (ACUTE ONLY): 38 min  Charges:  $Gait Training: 23-37 mins $Therapeutic Activity: 8-22 mins                    Leitha Bleak, PT 08/28/20, 2:56 PM

## 2020-08-28 NOTE — TOC Progression Note (Signed)
Transition of Care [facility]) - Progression Note    Patient Details  Name: Alexander Beard MRN: [id number] Date of Birth: January 17, 1956  [facility] CM/SW Contact  Shelbie Hutching, RN Phone Number: 08/28/2020, 1:02 PM  Clinical Narrative:    Danae Chen from [facility] called and said that they believe that patient would be a good fit for their facility.  He makes too much money to qualify for ALF Medicaid but would qualify for Memory Care.  Patient does not have a memory deficit diagnosed but previous group home has reported concerns about his memory.  MD will consult psychiatry to evaluate for any memory impairment.    Expected Discharge Plan: Group Home Barriers to Discharge: Continued Medical Work up  Expected Discharge Plan and Services Expected Discharge Plan: Group Home   Discharge Planning Services: CM Consult   Living arrangements for the past 2 months: Group Home                                       Social Determinants of Health (SDOH) Interventions    Readmission Risk Interventions Readmission Risk Prevention Plan 08/07/2020  Transportation Screening Complete  Medication Review (RN Care Manager) Complete  PCP or Specialist appointment within 3-5 days of discharge Complete  HRI or Home Care Consult Complete  SW Recovery Care/Counseling Consult Complete  Palliative Care Screening Not [place] Complete  Some recent data might be hidden

## 2020-08-28 NOTE — Consult Note (Signed)
[place] Psychiatry Consult   Reason for Consult: Consult for this 65 year old gentleman recovering from [place] and awaiting placement Referring Physician: Jimmye Norman Patient Identification: Alexander Beard MRN:  [id number] Principal Diagnosis: Active autistic disorder Diagnosis:  Principal Problem:   Active autistic disorder Active Problems:   Schizoaffective disorder, bipolar type ([place])   Hypertension   Controlled type 2 diabetes mellitus without complication ([place])   Pneumonia due to COVID-19 virus   Encephalopathy acute   Pneumonia   Total Time spent with patient: 1 hour  Subjective:   Alexander Beard is a 66 y.o. male patient admitted with "I just hope that it is gone".  HPI: Patient seen chart reviewed.  Patient extremely well-known from years of treatment.  This is a 65 year old man with chronic mental health and behavioral issues.  He has been in the hospital now for a fairly extended stay for recovery from [place].  He is now off of oxygen and in a regular hospital bed.  When I came to see him he was sitting up in the chair although he did have a support strap.  He was using his colored pencils to draw.  He was awake alert and engaged in conversation.  Alexander Beard complains of anxiety not really understanding the COVID and not knowing if it is truly gone or might come back again.  He has some physical complaints he is very upset about namely that he continues to leak urine much of the time and also that he is having some bowel problems.  He gets so embarrassed talking about his bowel problems it was hard for me to really get the details but it sounds like it is alternating constipation and loose stools.  All of that would be consistent with the problems he has had before especially things he suffered during his extended behavioral health hospital stay in 2021.  Alexander Beard was lucid and generally appeared like his normal self.  Remembered me immediately.  Able to discuss details of his history  and give me an update on his brothers care.  Certainly did not seem to be delirious.  Affect as usual was a combination of blunted and oddly intense.  Speech normal and understandable.  Certainly no suicidal or homicidal thought no evidence of psychosis reviewing his medicine it looks like he is pretty much on a similar type of medicine to what we had him on when he was discharged from behavioral health in November although some of the doses are a little lower.  Past Psychiatric History: Jonerik has a lifelong illness.  He carries a diagnosis of "schizoaffective disorder" in his chart but having known him for many years I do not believe this is correct.  I have never known him to have true psychotic symptoms and I have never known him to have what I would describe as a real depression or manic spell.  Certainly he can be moody at times but it does not fit the pattern or meet the criteria of a mood disorder.  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  Unfortunately this was never picked up and diagnosed when he was a child or adolescent.  Unfortunately when that happens it becomes increasingly difficult as the years go by to get an official diagnosis of developmental disability to stick.  I have tried to have him evaluated at autism clinics but they have never agreed to do the assessment because they cannot locate his school  records.  As a result he continues to get only the benefits and housing options usually allowed to people with "mental health" diagnoses and not to those with developmental disabilities.  This has been a problem that has been increasingly difficult in later years.  His closest relative is his brother Alexander Beard who in past years would devote huge amounts of energy to taking care of Alexander Beard and making sure that he was happy.  As both of them have gotten older this has been more and more difficult particularly because Alexander Beard is unable to change  his expectations or demands.  He continues to think of himself as a "young man" who wants to be around "young people" and bristles when he is put in more restrictive environments.  He has burned bridges at many group homes with his erratic behavior and has become extremely difficult to place.  During his last hospitalization his brother advocated for Alexander Beard to be placed in a locked memory unit.  Unfortunately, when you actually do formal cognitive testing his memory is not one of his main problems and he did not meet the criteria of dementia to allow him to get placed in a memory unit.  We ultimately did get him into a local group home.  As was expected he immediately started to have days of decompensation and had a few visits to the emergency room.  By good fortune the group home continued to stick with him and allowed him to come back.  Alexander Beard is not a suicide risk or a violence risk.  His behaviors can be disruptive and difficult to manage at times.  Risk to Self:   Risk to Others:   Prior Inpatient Therapy:   Prior Outpatient Therapy:    Past Medical History:  Past Medical History:  Diagnosis Date  . Anxiety   . Anxiety disorder due to known physiological condition    HOSPITALIZED 10/18  . Arthritis   . Autistic disorder, residual state 08/18/2020  . COPD (chronic obstructive pulmonary disease) ([place])   . Depression   . Developmental disorder   . Diabetes mellitus without complication ([place])   . Dyslipidemia   . Esophageal reflux   . HOH (hard of hearing)    MILDLY  . Hypertension   . Obesity   . Overactive bladder   . Palpitations    ANXIETY  . Schizophrenia, schizoaffective ([place])   . Sleep apnea   . Tremors of nervous system    HANDS DUE TO MEDICATIONS  . Urinary incontinence     Past Surgical History:  Procedure Laterality Date  . CATARACT EXTRACTION W/PHACO Left 11/14/2017   Procedure: CATARACT EXTRACTION PHACO AND INTRAOCULAR LENS PLACEMENT (IOC);  Surgeon: Birder Robson,  MD;  Location: [facility] ORS;  Service: Ophthalmology;  Laterality: Left;  [place] 00:42 AP% 14.6 CDE 6.12 Fluid pack lot # [id number]  . COLONOSCOPY  01/28/2005  . COLONOSCOPY WITH PROPOFOL N/A 05/09/2016   Procedure: COLONOSCOPY WITH PROPOFOL;  Surgeon: Manya Silvas, MD;  Location: [facility] ENDOSCOPY;  Service: Endoscopy;  Laterality: N/A;  . ESOPHAGOGASTRODUODENOSCOPY N/A 05/09/2016   Procedure: ESOPHAGOGASTRODUODENOSCOPY (EGD);  Surgeon: Manya Silvas, MD;  Location: [facility] ENDOSCOPY;  Service: Endoscopy;  Laterality: N/A;  . FRACTURE SURGERY     ORIF SHOULDER  . TONSILLECTOMY     Family History:  Family History  Problem Relation Age of Onset  . Hypertension Mother   . Stroke Father   . Heart Problems Father    Family Psychiatric  History: None Social  History:  Social History   Substance and Sexual Activity  Alcohol Use No  . Alcohol/week: 0.0 standard drinks     Social History   Substance and Sexual Activity  Drug Use No    Social History   Socioeconomic History  . Marital status: Single    Spouse name: Not on file  . Number of children: Not on file  . Years of education: Not on file  . Highest education level: Not on file  Occupational History  . Not on file  Tobacco Use  . Smoking status: Never Smoker  . Smokeless tobacco: Never Used  Vaping Use  . Vaping Use: Never used  Substance and Sexual Activity  . Alcohol use: No    Alcohol/week: 0.0 standard drinks  . Drug use: No  . Sexual activity: Never  Other Topics Concern  . Not on file  Social History Narrative   The patient never finished high school but did get his GED. 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 He is currently in disability and his brother Alexander Beard is his legal guardian. He has been living in group homes for many years.      No pending legal charges   Social Determinants of Health   Financial Resource Strain: Not on file  Food  Insecurity: Not on file  Transportation Needs: Not on file  Physical Activity: Not on file  Stress: Not on file  Social Connections: Not on file   Additional Social History:    Allergies:   Allergies  Allergen Reactions  . Other Other (See Comments)    MYACINS  . Penicillins Nausea And Vomiting  . Cortizone-10 [Hydrocortisone] Rash    Labs:  Results for orders placed or performed during the hospital encounter of 08/04/20 (from the past 48 hour(s))  Glucose, capillary     Status: Abnormal   Collection Time: 08/26/20  8:44 PM  Result Value Ref Range   Glucose-Capillary 149 (H) 70 - 99 mg/dL    Comment: Glucose reference range applies only to samples taken after fasting for at least 8 hours.  Glucose, capillary     Status: None   Collection Time: 08/27/20  8:08 AM  Result Value Ref Range   Glucose-Capillary 95 70 - 99 mg/dL    Comment: Glucose reference range applies only to samples taken after fasting for at least 8 hours.  Glucose, capillary     Status: Abnormal   Collection Time: 08/27/20 12:22 PM  Result Value Ref Range   Glucose-Capillary 135 (H) 70 - 99 mg/dL    Comment: Glucose reference range applies only to samples taken after fasting for at least 8 hours.  Glucose, capillary     Status: Abnormal   Collection Time: 08/27/20  4:43 PM  Result Value Ref Range   Glucose-Capillary 121 (H) 70 - 99 mg/dL    Comment: Glucose reference range applies only to samples taken after fasting for at least 8 hours.  Glucose, capillary     Status: Abnormal   Collection Time: 08/27/20  9:00 PM  Result Value Ref Range   Glucose-Capillary 106 (H) 70 - 99 mg/dL    Comment: Glucose reference range applies only to samples taken after fasting for at least 8 hours.  Basic metabolic panel     Status: Abnormal   Collection Time: 08/28/20  4:46 AM  Result Value Ref Range   Sodium 142 135 - 145 mmol/L  Potassium 4.5 3.5 - 5.1 mmol/L   Chloride 104 98 - 111 mmol/L   CO2 30 22 - 32 mmol/L    Glucose, Bld 128 (H) 70 - 99 mg/dL    Comment: Glucose reference range applies only to samples taken after fasting for at least 8 hours.   BUN 13 8 - 23 mg/dL   Creatinine, Ser 0.72 0.61 - 1.24 mg/dL   Calcium 9.3 8.9 - 10.3 mg/dL   GFR, Estimated >60 >60 mL/min    Comment: (NOTE) Calculated using the CKD-EPI Creatinine Equation (2021)    Anion gap 8 5 - 15    Comment: Performed at [facility], [address] [place], [place] [address]  CBC     Status: Abnormal   Collection Time: 08/28/20  4:46 AM  Result Value Ref Range   WBC 3.5 (L) 4.0 - 10.5 K/uL   RBC 4.18 (L) 4.22 - 5.81 MIL/uL   Hemoglobin 13.2 13.0 - 17.0 g/dL   HCT 40.1 39.0 - 52.0 %   MCV 95.9 80.0 - 100.0 fL   MCH 31.6 26.0 - 34.0 pg   MCHC 32.9 30.0 - 36.0 g/dL   RDW 14.6 11.5 - 15.5 %   Platelets 126 (L) 150 - 400 K/uL    Comment: Immature Platelet Fraction may be clinically indicated, consider ordering this additional test [id number]    nRBC 0.0 0.0 - 0.2 %    Comment: Performed at [facility], [address] [place],  [address]  Glucose, capillary     Status: Abnormal   Collection Time: 08/28/20  8:34 AM  Result Value Ref Range   Glucose-Capillary 103 (H) 70 - 99 mg/dL    Comment: Glucose reference range applies only to samples taken after fasting for at least 8 hours.  Glucose, capillary     Status: Abnormal   Collection Time: 08/28/20 11:38 AM  Result Value Ref Range   Glucose-Capillary 135 (H) 70 - 99 mg/dL    Comment: Glucose reference range applies only to samples taken after fasting for at least 8 hours.  Glucose, capillary     Status: Abnormal   Collection Time: 08/28/20  4:30 PM  Result Value Ref Range   Glucose-Capillary 201 (H) 70 - 99 mg/dL    Comment: Glucose reference range applies only to samples taken after fasting for at least 8 hours.  Glucose, capillary     Status: Abnormal   Collection Time: 08/28/20  8:01 PM  Result Value Ref Range   Glucose-Capillary 142  (H) 70 - 99 mg/dL    Comment: Glucose reference range applies only to samples taken after fasting for at least 8 hours.    Current Facility-Administered Medications  Medication Dose Route Frequency Provider Last Rate Last Admin  . acetaminophen (TYLENOL) tablet 650 mg  650 mg Oral Q6H PRN Darrick Meigs, Marge Duncans, MD      . albuterol (VENTOLIN HFA) 108 (90 Base) MCG/ACT inhaler 1-2 puff  1-2 puff Inhalation Q6H PRN Oswald Hillock, MD      . aspirin EC tablet 81 mg  81 mg Oral Daily Oswald Hillock, MD   81 mg at 08/28/20 0811  . clonazePAM (KLONOPIN) tablet 1 mg  1 mg Oral BID Oswald Hillock, MD   1 mg at 08/28/20 3300  . divalproex (DEPAKOTE ER) 24 hr tablet 1,000 mg  1,000 mg Oral QHS Oswald Hillock, MD   1,000 mg at 08/27/20 2124  . docusate sodium (COLACE) capsule  200 mg  200 mg Oral BID Wyvonnia Dusky, MD   200 mg at 08/28/20 3810  . enoxaparin (LOVENOX) injection 40 mg  40 mg Subcutaneous Q24H Oswald Hillock, MD   40 mg at 08/28/20 0813  . guaiFENesin-dextromethorphan (ROBITUSSIN DM) 100-10 MG/5ML syrup 10 mL  10 mL Oral Q4H PRN Oswald Hillock, MD      . hydrALAZINE (APRESOLINE) tablet 50 mg  50 mg Oral Q6H PRN Wyvonnia Dusky, MD      . hydrOXYzine (ATARAX/VISTARIL) tablet 10 mg  10 mg Oral TID PRN Oswald Hillock, MD      . insulin aspart (novoLOG) injection 0-5 Units  0-5 Units Subcutaneous QHS Oswald Hillock, MD   2 Units at 08/15/20 2018  . insulin aspart (novoLOG) injection 0-9 Units  0-9 Units Subcutaneous TID WC Oswald Hillock, MD   3 Units at 08/28/20 1637  . lamoTRIgine (LAMICTAL) tablet 150 mg  150 mg Oral QHS Oswald Hillock, MD   150 mg at 08/27/20 2121  . linagliptin (TRADJENTA) tablet 5 mg  5 mg Oral Daily Oswald Hillock, MD   5 mg at 08/28/20 0813  . LORazepam (ATIVAN) tablet 2 mg  2 mg Oral Q6H PRN Oswald Hillock, MD   2 mg at 08/26/20 2120  . ondansetron (ZOFRAN) tablet 4 mg  4 mg Oral Q6H PRN Oswald Hillock, MD       Or  . ondansetron [facility]) injection 4 mg  4 mg Intravenous Q6H PRN  Oswald Hillock, MD      . oxybutynin (DITROPAN) tablet 10 mg  10 mg Oral BID Oswald Hillock, MD   10 mg at 08/28/20 0813  . pantoprazole (PROTONIX) EC tablet 40 mg  40 mg Oral Daily Oswald Hillock, MD   40 mg at 08/28/20 1751  . polyethylene glycol (MIRALAX / GLYCOLAX) packet 17 g  17 g Oral Daily Wyvonnia Dusky, MD   17 g at 08/28/20 0810  . QUEtiapine (SEROQUEL) tablet 100 mg  100 mg Oral TID Oswald Hillock, MD   100 mg at 08/28/20 1637  . QUEtiapine (SEROQUEL) tablet 400 mg  400 mg Oral QHS Dorla Guizar T, MD      . senna-docusate (Senokot-S) tablet 1 tablet  1 tablet Oral QHS PRN Oswald Hillock, MD      . sertraline (ZOLOFT) tablet 50 mg  50 mg Oral BID Jedadiah Abdallah T, MD      . simethicone (MYLICON) chewable tablet 80 mg  80 mg Oral TID with meals Oswald Hillock, MD   80 mg at 08/28/20 1637    Musculoskeletal: Strength & Muscle Tone: decreased Gait & Station: unsteady Patient leans: N/A  Psychiatric Specialty Exam: Physical Exam Vitals and nursing note reviewed.  Constitutional:      Appearance: He is well-developed and well-nourished.  HENT:     Head: Normocephalic and atraumatic.  Eyes:     Conjunctiva/sclera: Conjunctivae normal.     Pupils: Pupils are equal, round, and reactive to light.  Cardiovascular:     Heart sounds: Normal heart sounds.  Pulmonary:     Effort: Pulmonary effort is normal.  Abdominal:     Palpations: Abdomen is soft.  Musculoskeletal:        General: Normal range of motion.     Cervical back: Normal range of motion.  Skin:    General: Skin is warm and dry.  Neurological:  Mental Status: He is alert.  Psychiatric:        Attention and Perception: He is inattentive.        Mood and Affect: Mood is anxious.        Speech: Speech normal.        Behavior: Behavior is slowed.        Thought Content: Thought content is not paranoid or delusional. Thought content does not include homicidal or suicidal ideation.        Cognition and Memory:  Cognition is impaired. Memory is impaired.        Judgment: Judgment is impulsive.     Review of Systems  Constitutional: Negative.   HENT: Negative.   Eyes: Negative.   Respiratory: Negative.   Cardiovascular: Negative.   Gastrointestinal: Positive for constipation and diarrhea.  Genitourinary: Positive for enuresis and frequency.  Musculoskeletal: Negative.   Skin: Negative.   Neurological: Negative.   Psychiatric/Behavioral: Negative.     Blood pressure 111/83, pulse 75, temperature (!) 97.3 F (36.3 C), temperature source Oral, resp. rate 17, height 5\' 8"  (1.727 m), weight 104 kg, SpO2 98 %.Body mass index is 34.86 kg/m.  General Appearance: Casual  Eye Contact:  Fair  Speech:  Normal Rate  Volume:  Normal  Mood:  Anxious  Affect:  Constricted  Thought Process:  Coherent  Orientation:  Full (Time, Place, and Person)  Thought Content:  Illogical and Tangential  Suicidal Thoughts:  No  Homicidal Thoughts:  No  Memory:  Immediate;   Fair Recent;   Fair Remote;   Fair  Judgement:  Impaired  Insight:  Lacking  Psychomotor Activity:  Normal  Concentration:  Concentration: Fair  Recall:  [employer] of Knowledge:  Fair  Language:  Fair  Akathisia:  No  Handed:  Right  AIMS (if indicated):     Assets:  Desire for Improvement Resilience Social Support  ADL's:  Impaired  Cognition:  Impaired,  Mild  Sleep:        Treatment Plan Summary: Medication management and Plan Made a couple of minor changes to his medicines such as increasing his nighttime Seroquel back up to 400.  I see he is on his full dose did triptan and already getting a bowel regimen.  We had a urologist see him when he was on behavioral health but I do not think there is really any other intervention particularly found.  I am sorry I do not have anything else right now to recommend for his urine and bowel problems.  I will make some observations and suggestions regarding placement: If you wait to find a  place that Alexander Beard "likes" you are probably going to be waiting a very long time.  Almost nothing realistic is going to meet the standards he wants.  Also, I would tend to discount any comments he makes about the group home having treated him badly.  This past group home he is in is known as a rather good 1 and I do not think there is any reason to think he was treated poorly.  If they would take him back that would probably be very fortunate.  Finally it is always best to involve his brother Alexander Beard in the conversation.  I am not sure if Alexander Beard is now the legal guardian or not but he certainly has always been involved in helping Alexander Beard make decisions.  I will continue to follow-up while he is in the hospital.  Disposition: No evidence of imminent risk  to self or others at present.   Patient does not meet criteria for psychiatric inpatient admission. Supportive therapy provided about ongoing stressors. Discussed crisis plan, support from social network, calling 911, coming to the Emergency Department, and calling Suicide Hotline.  Alethia Berthold, MD 08/28/2020 8:36 PM

## 2020-08-28 NOTE — TOC Progression Note (Signed)
Transition of Care [facility]) - Progression Note    Patient Details  Name: Alexander Beard MRN: [id number] Date of Birth: August 09, 1955  [facility] of [facility] CM/SW Contact  Shelbie Hutching, RN Phone Number: 08/28/2020, 11:04 AM  Clinical Narrative:    Danae Chen from [facility] came to assess patient yesterday, mentally she reports patient looks good, he was very engaged and agreeable.  Patient does have some difficulty with mobility.  Danae Chen will get back to [facility] team and let us know if they can offer a bed.    Expected Discharge Plan: Group Home Barriers to Discharge: Continued Medical Work up  Expected Discharge Plan and Services Expected Discharge Plan: Group Home   Discharge Planning Services: CM Consult   Living arrangements for the past 2 months: Group Home                                       Social Determinants of Health (SDOH) Interventions    Readmission Risk Interventions Readmission Risk Prevention Plan 08/07/2020  Transportation Screening Complete  Medication Review (RN Care Manager) Complete  PCP or Specialist appointment within 3-5 days of discharge Complete  HRI or Home Care Consult Complete  SW Recovery Care/Counseling Consult Complete  Palliative Care Screening Not [place] Complete  Some recent data might be hidden

## 2020-08-29 DIAGNOSIS — F840 Autistic disorder: Secondary | ICD-10-CM | POA: Diagnosis not present

## 2020-08-29 LAB — GLUCOSE, CAPILLARY
Glucose-Capillary: 148 mg/dL — ABNORMAL HIGH (ref 70–99)
Glucose-Capillary: 111 mg/dL — ABNORMAL HIGH (ref 70–99)
Glucose-Capillary: 126 mg/dL — ABNORMAL HIGH (ref 70–99)
Glucose-Capillary: 159 mg/dL — ABNORMAL HIGH (ref 70–99)

## 2020-08-29 NOTE — Evaluation (Signed)
Occupational Therapy Evaluation Patient Details Name: Alexander Beard MRN: [id number] DOB: Dec 25, 1955 Today's Date: 08/29/2020    History of Present Illness 65 y.o. male with medical history significant for schizoaffective disorder, bipolar type, autism spectrum disorder, COPD, T2DM, HTN, HLD who presents to the ED for evaluation of shortness of breath. Covid-19+ on 08/04/20.  Per brother, patient recently had a prolonged behavioral health hospital stay from Aug - Dec 2021 before discharged to group care home in [place] which is a new environment for him.  Brother states that he has noted significant "grogginess" over the last few weeks since medication schedule changes (4x/day to 2x/day).  This has coincided with several falls that have not resulted in any significant injuries.   Clinical Impression   Pt seen for OT treatment this date to f/u re: safety with ADLs/ADL mobility. OT presents to room as pt's chair alarm is noted to be going off and pt with significant fall risk. Pt was indeed trying to get out of chair and his speech presents as flight of ideas related to stress, anxiety, and his brother [name]. OT implements therapeutic use of self and relaxation techniques such as guided imagery to calm pt. Pt responds well. OT engages pt in therapeutic activity to optimize seated positioning through posterior scooting, using triceps to propel. Pt with great difficulty sequencing/motor planning and benefits from visual demonstration by therapist, but ultimately requires MOD A with use of draw sheet in the chair. OT engages pt in seated therex for his core and UB strenghth including straight arm reaches and modified chair sit-ups/to reaches for 2 sets of 10. Pt tolerates all activity and exercise well. Good portion of session spent on calming and therapeutic listening with patient responding well and noted to be slipping into a nap/sleep at end of session so OT repositions by leaning chair back and creating  neck roll for cervical support. Pt left with posey belt chair alarm on and all needs met and in reach.     Follow Up Recommendations  SNF    Equipment Recommendations  3 in 1 bedside commode    Recommendations for Other Services       Precautions / Restrictions Precautions Precautions: Fall Restrictions Weight Bearing Restrictions: No      Mobility Bed Mobility        Transfers           Balance            ADL either performed or assessed with clinical judgement   ADL                Vision Patient Visual Report: No change from baseline       Perception     Praxis      Pertinent Vitals/Pain Pain Assessment: No/denies pain     Hand Dominance     Extremity/Trunk Assessment             Communication     Cognition Arousal/Alertness: Awake/alert Behavior During Therapy: Impulsive;Anxious Overall Cognitive Status: No family/caregiver present to determine baseline cognitive functioning Area of Impairment: Orientation;Attention;Memory;Following commands;Safety/judgement;Awareness;Problem solving                 Orientation Level: Disoriented to;Time Current Attention Level: Focused Memory: Decreased recall of precautions;Decreased short-term memory Following Commands: Follows one step commands inconsistently Safety/Judgement: Decreased awareness of safety;Decreased awareness of deficits Awareness: Intellectual Problem Solving: Slow processing;Requires tactile cues;Requires verbal cues     General Comments  Exercises Other Exercises Other Exercises: OT engages pt in scooting back in chair with moderate verbal/visual cues to weight shift for repositioning and MOD A. Other Exercises: OT engages pt in seated straight arm reaches and FWD reaches/modified chair situp for 2setsx10, to increase core strength with moderate visual/tactile cues for form/pace and technique. Other Exercises: OT provides therapeutic listening and relaxation  techniques to calm and relieve stress.   Shoulder Instructions      Home Living                                          Prior Functioning/Environment                   OT Problem List:        OT Treatment/Interventions:      OT Goals(Current goals can be found in the care plan section) Acute Rehab OT Goals Patient Stated Goal: to discharge from hospital OT Goal Formulation: With patient Time For Goal Achievement: 09/03/20 Potential to Achieve Goals: Good  OT Frequency: Min 2X/week   Barriers to D/C:            Co-evaluation              AM-PAC OT "6 Clicks" Daily Activity     Outcome Measure Help from another person eating meals?: A Little Help from another person taking care of personal grooming?: A Little Help from another person toileting, which includes using toliet, bedpan, or urinal?: A Lot Help from another person bathing (including washing, rinsing, drying)?: A Lot Help from another person to put on and taking off regular upper body clothing?: A Little Help from another person to put on and taking off regular lower body clothing?: A Lot 6 Click Score: 15   End of Session Nurse Communication: Mobility status  Activity Tolerance: Patient tolerated treatment well Patient left: in chair;with call bell/phone within reach;with chair alarm set  OT Visit Diagnosis: Unsteadiness on feet (R26.81);History of falling (Z91.81);Muscle weakness (generalized) (M62.81);Other symptoms and signs involving cognitive function                Time: 7121-9758 OT Time Calculation (min): 39 min Charges:  OT General Charges $OT Visit: 1 Visit OT Treatments $Self Care/Home Management : 8-22 mins $Therapeutic Activity: 8-22 mins $Therapeutic Exercise: 8-22 mins  Gerrianne Scale, MS, OTR/L ascom [phone] 08/29/20, 1:45 PM

## 2020-08-29 NOTE — Progress Notes (Signed)
This RN requested NT not to wake patient up to obtain midnight vitals

## 2020-08-29 NOTE — Progress Notes (Signed)
   08/29/20 0715  What Happened  Was fall witnessed? No  Was patient injured? No  Patient found other (Comment) (bedside of bed on floor mat)  Found by Staff-comment (Rachida NT)  Stated prior activity ambulating-unassisted  Follow Up  MD notified Dr Billie Ruddy  Time MD notified [phone]  Family notified Yes - comment Jakyron Fabro)  Time family notified [phone]  Additional tests No  Adult Fall Risk Assessment  Risk Factor Category (scoring not indicated) Fall has occurred during this admission (document High fall risk)  Patient Fall Risk Level High fall risk  Adult Fall Risk Interventions  Required Bundle Interventions *See Row Information* High fall risk - low, moderate, and high requirements implemented  Additional Interventions Use of appropriate toileting equipment (bedpan, BSC, etc.);Room near [place] for Fall Injury Risk (To be completed on HIGH fall risk patients) - Assessing Need for Floor Mats  Risk For Fall Injury- Criteria for Floor Mats Previous fall this admission  Will Implement Floor Mats Yes  Pain Assessment  Pain Scale 0-10  Pain Score 0  Neurological  Neuro (WDL) X  Level of Consciousness Alert  Orientation Level Oriented to person;Oriented to place;Disoriented to time;Disoriented to situation  Cognition Memory impairment;Poor safety awareness;Poor judgement;Poor attention/concentration;Impulsive;Developmentally delayed  Speech Clear  Neuro Symptoms Anxiety  Glasgow Coma Scale  Eye Opening 4  Best Verbal Response (NON-intubated) 5  Best Motor Response 6  Glasgow Coma Scale Score 15  Musculoskeletal  Musculoskeletal (WDL) X  Assistive Device BSC  Generalized Weakness Yes  Integumentary  Integumentary (WDL) X  Skin Condition Dry  Skin Integrity Abrasion;Ecchymosis  Abrasion Location Arm;Foot;Leg  Abrasion Location Orientation Bilateral  Ecchymosis Location Foot  Ecchymosis Location Orientation Right
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
## 2020-08-30 DIAGNOSIS — F840 Autistic disorder: Secondary | ICD-10-CM | POA: Diagnosis not present

## 2020-08-30 LAB — BASIC METABOLIC PANEL
Anion gap: 7 (ref 5–15)
BUN: 17 mg/dL (ref 8–23)
CO2: 30 mmol/L (ref 22–32)
Calcium: 8.9 mg/dL (ref 8.9–10.3)
Chloride: 105 mmol/L (ref 98–111)
Creatinine, Ser: 0.76 mg/dL (ref 0.61–1.24)
GFR, Estimated: >60 mL/min (ref >60)
Glucose, Bld: 115 mg/dL — ABNORMAL HIGH (ref 70–99)
Potassium: 4.4 mmol/L (ref 3.5–5.1)
Sodium: 142 mmol/L (ref 135–145)

## 2020-08-30 LAB — CBC
HCT: 38.7 % — ABNORMAL LOW (ref 39.0–52.0)
Hemoglobin: 12.5 g/dL — ABNORMAL LOW (ref 13.0–17.0)
MCH: 31.5 pg (ref 26.0–34.0)
MCHC: 32.3 g/dL (ref 30.0–36.0)
MCV: 97.5 fL (ref 80.0–100.0)
Platelets: 132 10*3/uL — ABNORMAL LOW (ref 150–400)
RBC: 3.97 MIL/uL — ABNORMAL LOW (ref 4.22–5.81)
RDW: 14.8 % (ref 11.5–15.5)
WBC: 4.7 10*3/uL (ref 4.0–10.5)
nRBC: 0 % (ref 0.0–0.2)

## 2020-08-30 LAB — GLUCOSE, CAPILLARY
Glucose-Capillary: 107 mg/dL — ABNORMAL HIGH (ref 70–99)
Glucose-Capillary: 116 mg/dL — ABNORMAL HIGH (ref 70–99)

## 2020-08-30 LAB — MAGNESIUM: Magnesium: 2.1 mg/dL (ref 1.7–2.4)

## 2020-08-30 NOTE — Progress Notes (Signed)
PROGRESS NOTE   HPI was taken from Dr. Zada Finders: Alexander Beard is a 65 y.o. male with medical history significant for schizoaffective disorder, bipolar type, autism spectrum disorder, COPD, T2DM, HTN, HLD who presents to the ED for evaluation of shortness of breath.  Unable to obtain history from patient due to excessive somnolence and is otherwise obtained from EDP, chart review, and brother by phone.  Per ED documentation, patient was brought to ED via EMS who on their arrival noted patient SPO2 was 92% on room air with coarse crackles in the lungs.  He was placed on 4 L O2 via [place] on route to the ED.  On arrival patient was reportedly alert and interactive however following triage and initial labs he had a change in mental status described as a mumbling speech pattern and minimally responsive.  Spoke with patient's brother by phone.  His brother says that patient recently had a prolonged behavioral health hospital stay from August to December before he was discharged to group care home in [place] which is a new environment for him.  Brother states that previously patient's medication schedule was 4 times daily but since he left the hospital is now taking meds twice daily.  Brother states that he has noted significant "grogginess" over the last few weeks.  This has coincided with several falls that have not resulted in any significant injuries.  ED Course:  Initial vitals showed BP 104/65, pulse 68, RR 18, temp 99.1 F, SPO2 96% on room air.  Labs showed sodium 140, potassium 4.0, bicarb 28, BUN 13, creatinine 0.68, serum glucose 105, WBC 4.0, hemoglobin 14.1, platelets 105,000, high-sensitivity troponin I 4x2, procalcitonin <0.10, lactic acid 2.3, valproic acid level 92, ammonia 28.  VBG showed pH 7.39, PCO2 62.  Urinalysis shows negative nitrates, negative leukocytes, 0-5 RBCs and WBCs/hpf, rare bacteria microscopy.  UDS positive for tricyclic's and benzodiazepines.  Blood cultures  obtained and pending.  SARS-CoV-2 PCR is positive.  2 view chest x-ray shows airspace opacity at the left lung base.  CT head without contrast is negative for acute intracranial abnormality.  Atrophy, chronic microvascular disease noted.  CT cervical spine without contrast shows diffuse degenerative disc and facet disease without acute bony abnormality.  Patient was given 1 L LR and the hospitalist service was consulted to admit for further evaluation and management.   Hospital Course from Dr. Jimmye Norman 2/9-2/18/22: Pt completed remdesivir and steroid course for COVID19 pneumonia. Pt also has been weaned off of oxygen. CM is having trouble with placement issues for the pt. PT/OT recs SNF. Will have psych evaluate.    Alexander Beard  VOH:[id number] DOB: Feb 17, 1956 DOA: 08/04/2020 PCP: Alexander Haggard, FNP  Assessment & Plan:   Principal Problem:   Active autistic disorder Active Problems:   Schizoaffective disorder, bipolar type ([place])   Hypertension   Controlled type 2 diabetes mellitus without complication ([place])   Pneumonia due to COVID-19 virus   Encephalopathy acute   Pneumonia  Acute hypoxic respiratory failure: resolved. Completed remdesivir & steroid courses. Secondary to COVID19 pneumonia   Acute metabolic encephalopathy: likely overmedicated, dose has been reduced. Mental status waxes and wanes   Schizoaffective disorder: long term group resident. Continue on seroquel, lamictal, zoloft & clonoazepam.  Hypokalemia:  --monitor and replete PRN  Thrombocytopenia: etiology unclear, labile.   DM2:  continue on tradjenta --no need for fingersticks and SSI   DVT prophylaxis: lovenox  Code Status: full  Family Communication:  Disposition Plan: PT/OT recs  SNF, still waiting on SNF placement   Level of care: Med-Surg   Status is: Inpatient  Remains inpatient appropriate because:Unsafe d/c plan   Dispo:  Patient From: home   Planned Disposition: SNF    Expected discharge date: whenever SNF bed or other facility is available   Medically stable for discharge: yes      Consultants:      Procedures:   Antimicrobials:   Subjective: Pt was seen in much better mood today, in bed drawing a peacock, eating and talking normally.     Objective: Vitals:   08/29/20 2019 08/30/20 0456 08/30/20 0732 08/30/20 1212  BP: 115/70 (!) 97/57 120/85 127/86  Pulse: 87 78 81 70  Resp: 18 16 16 16   Temp: 98.5 F (36.9 C) 98 F (36.7 C) 97.6 F (36.4 C) 98 F (36.7 C)  TempSrc: Oral Oral Oral   SpO2: 99% 97% 97% 99%  Weight:      Height:        Intake/Output Summary (Last 24 hours) at 08/30/2020 1420 Last data filed at 08/30/2020 1402 Gross per 24 hour  Intake 0 ml  Output 250 ml  Net -250 ml   Filed Weights   08/18/20 2304  Weight: 104 kg    Examination:  Constitutional: NAD, alert, oriented to person and place, calm and coherent today HEENT: conjunctivae and lids normal, EOMI CV: No cyanosis.   RESP: normal respiratory effort, on RA Extremities: No effusions, edema in BLE SKIN: warm, dry.  Brown discoloration over right lower leg.   Neuro: II - XII grossly intact.   Psych: better mood and affect.     Data Reviewed: I have personally reviewed following labs and imaging studies  CBC: Recent Labs  Lab 08/28/20 0446 08/30/20 0446  WBC 3.5* 4.7  HGB 13.2 12.5*  HCT 40.1 38.7*  MCV 95.9 97.5  PLT 126* 063*   Basic Metabolic Panel: Recent Labs  Lab 08/28/20 0446 08/30/20 0446  NA 142 142  K 4.5 4.4  CL 104 105  CO2 30 30  GLUCOSE 128* 115*  BUN 13 17  CREATININE 0.72 0.76  CALCIUM 9.3 8.9  MG  --  2.1   GFR: Estimated Creatinine Clearance: 109 mL/min (by C-G formula based on SCr of 0.76 mg/dL). Liver Function Tests: No results for input(s): AST, ALT, ALKPHOS, BILITOT, PROT, ALBUMIN in the last 168 hours. No results for input(s): LIPASE, AMYLASE in the last 168 hours. No results for input(s): AMMONIA in  the last 168 hours. Coagulation Profile: No results for input(s): INR, PROTIME in the last 168 hours. Cardiac Enzymes: No results for input(s): CKTOTAL, CKMB, CKMBINDEX, TROPONINI in the last 168 hours. BNP (last 3 results) No results for input(s): PROBNP in the last 8760 hours. HbA1C: No results for input(s): HGBA1C in the last 72 hours. CBG: Recent Labs  Lab 08/29/20 1301 08/29/20 1739 08/29/20 2020 08/30/20 0750 08/30/20 1149  GLUCAP 111* 126* 159* 107* 116*   Lipid Profile: No results for input(s): CHOL, HDL, LDLCALC, TRIG, CHOLHDL, LDLDIRECT in the last 72 hours. Thyroid Function Tests: No results for input(s): TSH, T4TOTAL, FREET4, T3FREE, THYROIDAB in the last 72 hours. Anemia Panel: No results for input(s): VITAMINB12, FOLATE, FERRITIN, TIBC, IRON, RETICCTPCT in the last 72 hours. Sepsis Labs: No results for input(s): PROCALCITON, LATICACIDVEN in the last 168 hours.  No results found for this or any previous visit (from the past 240 hour(s)).       Radiology Studies: No results found.  Scheduled Meds: . aspirin EC  81 mg Oral Daily  . clonazePAM  1 mg Oral BID  . divalproex  1,000 mg Oral QHS  . docusate sodium  200 mg Oral BID  . enoxaparin (LOVENOX) injection  40 mg Subcutaneous Q24H  . lamoTRIgine  150 mg Oral QHS  . linagliptin  5 mg Oral Daily  . oxybutynin  10 mg Oral BID  . pantoprazole  40 mg Oral Daily  . polyethylene glycol  17 g Oral Daily  . QUEtiapine  100 mg Oral TID  . QUEtiapine  400 mg Oral QHS  . sertraline  50 mg Oral BID  . simethicone  80 mg Oral TID with meals   Continuous Infusions:   LOS: 25 days    Enzo Bi, MD [facility] Pager [phone]  If 7PM-7AM, please contact night-coverage 08/30/2020, 2:20 PM
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
## 2020-08-31 DIAGNOSIS — F840 Autistic disorder: Secondary | ICD-10-CM | POA: Diagnosis not present

## 2020-08-31 LAB — GLUCOSE, CAPILLARY
Glucose-Capillary: 86 mg/dL (ref 70–99)
Glucose-Capillary: 87 mg/dL (ref 70–99)
Glucose-Capillary: 168 mg/dL — ABNORMAL HIGH (ref 70–99)
Glucose-Capillary: 314 mg/dL — ABNORMAL HIGH (ref 70–99)

## 2020-08-31 NOTE — Progress Notes (Signed)
PROGRESS NOTE   HPI was taken from Dr. Zada Finders: Alexander Beard is a 65 y.o. male with medical history significant for schizoaffective disorder, bipolar type, autism spectrum disorder, COPD, T2DM, HTN, HLD who presents to the ED for evaluation of shortness of breath.  Unable to obtain history from patient due to excessive somnolence and is otherwise obtained from EDP, chart review, and brother by phone.  Per ED documentation, patient was brought to ED via EMS who on their arrival noted patient SPO2 was 92% on room air with coarse crackles in the lungs.  He was placed on 4 L O2 via [place] on route to the ED.  On arrival patient was reportedly alert and interactive however following triage and initial labs he had a change in mental status described as a mumbling speech pattern and minimally responsive.  Spoke with patient's brother by phone.  His brother says that patient recently had a prolonged behavioral health hospital stay from August to December before he was discharged to group care home in [place] which is a new environment for him.  Brother states that previously patient's medication schedule was 4 times daily but since he left the hospital is now taking meds twice daily.  Brother states that he has noted significant "grogginess" over the last few weeks.  This has coincided with several falls that have not resulted in any significant injuries.  ED Course:  Initial vitals showed BP 104/65, pulse 68, RR 18, temp 99.1 F, SPO2 96% on room air.  Labs showed sodium 140, potassium 4.0, bicarb 28, BUN 13, creatinine 0.68, serum glucose 105, WBC 4.0, hemoglobin 14.1, platelets 105,000, high-sensitivity troponin I 4x2, procalcitonin <0.10, lactic acid 2.3, valproic acid level 92, ammonia 28.  VBG showed pH 7.39, PCO2 62.  Urinalysis shows negative nitrates, negative leukocytes, 0-5 RBCs and WBCs/hpf, rare bacteria microscopy.  UDS positive for tricyclic's and benzodiazepines.  Blood cultures  obtained and pending.  SARS-CoV-2 PCR is positive.  2 view chest x-ray shows airspace opacity at the left lung base.  CT head without contrast is negative for acute intracranial abnormality.  Atrophy, chronic microvascular disease noted.  CT cervical spine without contrast shows diffuse degenerative disc and facet disease without acute bony abnormality.  Patient was given 1 L LR and the hospitalist service was consulted to admit for further evaluation and management.   Hospital Course from Dr. Jimmye Norman 2/9-2/18/22: Pt completed remdesivir and steroid course for COVID19 pneumonia. Pt also has been weaned off of oxygen. CM is having trouble with placement issues for the pt. PT/OT recs SNF. Will have psych evaluate.    Alexander Beard  VOJ:[id number] DOB: July 20, 1955 DOA: 08/04/2020 PCP: Remi Haggard, FNP  Assessment & Plan:   Principal Problem:   Active autistic disorder Active Problems:   Schizoaffective disorder, bipolar type ([place])   Hypertension   Controlled type 2 diabetes mellitus without complication ([place])   Pneumonia due to COVID-19 virus   Encephalopathy acute   Pneumonia  Acute hypoxic respiratory failure: resolved.  Secondary to COVID19 pneumonia  Completed remdesivir & steroid courses.  Acute metabolic encephalopathy:  likely overmedicated, dose has been reduced. Mental status waxes and wanes   Schizoaffective disorder:  long term group resident. Continue on seroquel, lamictal, zoloft & clonoazepam.  Hypokalemia:  --monitor and replete PRN  Thrombocytopenia: etiology unclear, labile.   DM2:  continue on tradjenta --no need for fingersticks and SSI   DVT prophylaxis: lovenox  Code Status: full  Family Communication:  Disposition  Plan: PT/OT recs SNF, still waiting on SNF placement   Level of care: Med-Surg   Status is: Inpatient  Remains inpatient appropriate because:Unsafe d/c plan   Dispo:  Patient From: home   Planned Disposition: SNF    Expected discharge date: whenever SNF bed or other facility is available   Medically stable for discharge: yes      Consultants:      Procedures:   Antimicrobials:   Subjective: Pt was quite agitated with a phone call.  Worked with PT and still needs SNF placement.   Objective: Vitals:   08/30/20 1932 08/31/20 0512 08/31/20 0741 08/31/20 0929  BP: (!) 117/97 130/86 (!) 148/82   Pulse: 68 70 (!) 44 78  Resp: 16 16 15    Temp: 98.1 F (36.7 C) 97.6 F (36.4 C) (!) 97.5 F (36.4 C)   TempSrc: Oral Oral    SpO2: 100% 97% (!) 88% 98%  Weight:      Height:        Intake/Output Summary (Last 24 hours) at 08/31/2020 1624 Last data filed at 08/31/2020 1411 Gross per 24 hour  Intake 240 ml  Output 0 ml  Net 240 ml   Filed Weights   08/18/20 2304  Weight: 104 kg    Examination:  Constitutional: NAD, alert, sitting up in chair HEENT: conjunctivae and lids normal, EOMI CV: No cyanosis.   RESP: normal respiratory effort, on RA Extremities: No effusions, edema in BLE SKIN: warm, dry.  Brown discoloration over right lower leg. Neuro: II - XII grossly intact.   Psych: agitated mood and affect today.     Data Reviewed: I have personally reviewed following labs and imaging studies  CBC: Recent Labs  Lab 08/28/20 0446 08/30/20 0446  WBC 3.5* 4.7  HGB 13.2 12.5*  HCT 40.1 38.7*  MCV 95.9 97.5  PLT 126* 233*   Basic Metabolic Panel: Recent Labs  Lab 08/28/20 0446 08/30/20 0446  NA 142 142  K 4.5 4.4  CL 104 105  CO2 30 30  GLUCOSE 128* 115*  BUN 13 17  CREATININE 0.72 0.76  CALCIUM 9.3 8.9  MG  --  2.1   GFR: Estimated Creatinine Clearance: 109 mL/min (by C-G formula based on SCr of 0.76 mg/dL). Liver Function Tests: No results for input(s): AST, ALT, ALKPHOS, BILITOT, PROT, ALBUMIN in the last 168 hours. No results for input(s): LIPASE, AMYLASE in the last 168 hours. No results for input(s): AMMONIA in the last 168 hours. Coagulation Profile: No  results for input(s): INR, PROTIME in the last 168 hours. Cardiac Enzymes: No results for input(s): CKTOTAL, CKMB, CKMBINDEX, TROPONINI in the last 168 hours. BNP (last 3 results) No results for input(s): PROBNP in the last 8760 hours. HbA1C: No results for input(s): HGBA1C in the last 72 hours. CBG: Recent Labs  Lab 08/29/20 2020 08/30/20 0750 08/30/20 1149 08/31/20 0823 08/31/20 1230  GLUCAP 159* 107* 116* 86 87   Lipid Profile: No results for input(s): CHOL, HDL, LDLCALC, TRIG, CHOLHDL, LDLDIRECT in the last 72 hours. Thyroid Function Tests: No results for input(s): TSH, T4TOTAL, FREET4, T3FREE, THYROIDAB in the last 72 hours. Anemia Panel: No results for input(s): VITAMINB12, FOLATE, FERRITIN, TIBC, IRON, RETICCTPCT in the last 72 hours. Sepsis Labs: No results for input(s): PROCALCITON, LATICACIDVEN in the last 168 hours.  No results found for this or any previous visit (from the past 240 hour(s)).       Radiology Studies: No results found.  Scheduled Meds: . aspirin EC  81 mg Oral Daily  . clonazePAM  1 mg Oral BID  . divalproex  1,000 mg Oral QHS  . docusate sodium  200 mg Oral BID  . enoxaparin (LOVENOX) injection  40 mg Subcutaneous Q24H  . lamoTRIgine  150 mg Oral QHS  . linagliptin  5 mg Oral Daily  . oxybutynin  10 mg Oral BID  . pantoprazole  40 mg Oral Daily  . polyethylene glycol  17 g Oral Daily  . QUEtiapine  100 mg Oral TID  . QUEtiapine  400 mg Oral QHS  . sertraline  50 mg Oral BID  . simethicone  80 mg Oral TID with meals   Continuous Infusions:   LOS: 26 days    Enzo Bi, MD [facility] Pager [phone]  If 7PM-7AM, please contact night-coverage 08/31/2020, 4:24 PM

## 2020-08-31 NOTE — Consult Note (Signed)
[facility]ry Consult   Reason for Consult: Follow-up for this 65 year old man with autism recovering from [place] Referring Physician:  Billie Ruddy Patient Identification: Alexander Beard MRN:  [id number] Principal Diagnosis: Active autistic disorder Diagnosis:  Principal Problem:   Active autistic disorder Active Problems:   Schizoaffective disorder, bipolar type ([place])   Hypertension   Controlled type 2 diabetes mellitus without complication ([place])   Pneumonia due to COVID-19 virus   Encephalopathy acute   Pneumonia   Total Time spent with patient: 30 minutes  Subjective:   Alexander Beard is a 65 y.o. male patient admitted with "I think I upset another patient".  HPI: Patient seen chart reviewed.  Came to see him this afternoon.  He was tearful and upset and said that he thought that he had upset another patient.  I tried to get him to explain what he meant.  I pointed out to him that he is unlikely to have even been around another patient since he has a single private room here.  Patient seemed a little confused.  He ultimately admitted that perhaps he was imagining things.  I pointed out that this could have been a dream as well.  I do not think he is psychotic but this is not out of the ordinary for [facility] to sometimes get stuck on a confused point for a while.  Denies suicidal thoughts but is certainly unhappy as you would expect from still being cooped up in this room.  Past Psychiatric History: Long history of chronic behavior problems  Risk to Self:   Risk to Others:   Prior Inpatient Therapy:   Prior Outpatient Therapy:    Past Medical History:  Past Medical History:  Diagnosis Date  . Anxiety   . Anxiety disorder due to known physiological condition    HOSPITALIZED 10/18  . Arthritis   . Autistic disorder, residual state 08/18/2020  . COPD (chronic obstructive pulmonary disease) ([place])   . Depression   . Developmental disorder   . Diabetes mellitus without  complication ([place])   . Dyslipidemia   . Esophageal reflux   . HOH (hard of hearing)    MILDLY  . Hypertension   . Obesity   . Overactive bladder   . Palpitations    ANXIETY  . Schizophrenia, schizoaffective ([place])   . Sleep apnea   . Tremors of nervous system    HANDS DUE TO MEDICATIONS  . Urinary incontinence     Past Surgical History:  Procedure Laterality Date  . CATARACT EXTRACTION W/PHACO Left 11/14/2017   Procedure: CATARACT EXTRACTION PHACO AND INTRAOCULAR LENS PLACEMENT (IOC);  Surgeon: Birder Robson, MD;  Location: [facility] ORS;  Service: Ophthalmology;  Laterality: Left;  [place] 00:42 AP% 14.6 CDE 6.12 Fluid pack lot # [id number]  . COLONOSCOPY  01/28/2005  . COLONOSCOPY WITH PROPOFOL N/A 05/09/2016   Procedure: COLONOSCOPY WITH PROPOFOL;  Surgeon: Manya Silvas, MD;  Location: [facility] ENDOSCOPY;  Service: Endoscopy;  Laterality: N/A;  . ESOPHAGOGASTRODUODENOSCOPY N/A 05/09/2016   Procedure: ESOPHAGOGASTRODUODENOSCOPY (EGD);  Surgeon: Manya Silvas, MD;  Location: [facility] ENDOSCOPY;  Service: Endoscopy;  Laterality: N/A;  . FRACTURE SURGERY     ORIF SHOULDER  . TONSILLECTOMY     Family History:  Family History  Problem Relation Age of Onset  . Hypertension Mother   . Stroke Father   . Heart Problems Father    Family Psychiatric  History: See prior Social History:  Social History   Substance and Sexual Activity  Alcohol Use No  . Alcohol/week: 0.0 standard drinks     Social History   Substance and Sexual Activity  Drug Use No    Social History   Socioeconomic History  . Marital status: Single    Spouse name: Not on file  . Number of children: Not on file  . Years of education: Not on file  . Highest education level: Not on file  Occupational History  . Not on file  Tobacco Use  . Smoking status: Never Smoker  . Smokeless tobacco: Never Used  Vaping Use  . Vaping Use: Never used  Substance and Sexual Activity  . Alcohol use: No    Alcohol/week: 0.0  standard drinks  . Drug use: No  . Sexual activity: Never  Other Topics Concern  . Not on file  Social History Narrative   The patient never finished high school but did get his GED. 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 He is currently in disability and [name] is his legal guardian. He has been living in group homes for many years.      No pending legal charges   Social Determinants of Health   Financial Resource Strain: Not on file  Food Insecurity: Not on file  Transportation Needs: Not on file  Physical Activity: Not on file  Stress: Not on file  Social Connections: Not on file   Additional Social History:    Allergies:   Allergies  Allergen Reactions  . Other Other (See Comments)    MYACINS  . Penicillins Nausea And Vomiting  . Cortizone-10 [Hydrocortisone] Rash    Labs:  Results for orders placed or performed during the hospital encounter of 08/04/20 (from the past 48 hour(s))  Glucose, capillary     Status: Abnormal   Collection Time: 08/29/20  8:20 PM  Result Value Ref Range   Glucose-Capillary 159 (H) 70 - 99 mg/dL    Comment: Glucose reference range applies only to samples taken after fasting for at least 8 hours.  Basic metabolic panel     Status: Abnormal   Collection Time: 08/30/20  4:46 AM  Result Value Ref Range   Sodium 142 135 - 145 mmol/L   Potassium 4.4 3.5 - 5.1 mmol/L   Chloride 105 98 - 111 mmol/L   CO2 30 22 - 32 mmol/L   Glucose, Bld 115 (H) 70 - 99 mg/dL    Comment: Glucose reference range applies only to samples taken after fasting for at least 8 hours.   BUN 17 8 - 23 mg/dL   Creatinine, Ser 0.76 0.61 - 1.24 mg/dL   Calcium 8.9 8.9 - 10.3 mg/dL   GFR, Estimated >60 >60 mL/min    Comment: (NOTE) Calculated using the CKD-EPI Creatinine Equation (2021)    Anion gap 7 5 - 15    Comment: Performed at [facility], [address] [place], [place] [address]   CBC     Status: Abnormal   Collection Time: 08/30/20  4:46 AM  Result Value Ref Range   WBC 4.7 4.0 - 10.5 K/uL   RBC 3.97 (L) 4.22 - 5.81 MIL/uL   Hemoglobin 12.5 (L) 13.0 - 17.0 g/dL   HCT 38.7 (L) 39.0 - 52.0 %   MCV 97.5 80.0 - 100.0 fL   MCH 31.5 26.0 - 34.0 pg   MCHC 32.3 30.0 - 36.0 g/dL   RDW 14.8 11.5 - 15.5 %  Platelets 132 (L) 150 - 400 K/uL   nRBC 0.0 0.0 - 0.2 %    Comment: Performed at [facility], [address] [place], [place] [address]  Magnesium     Status: None   Collection Time: 08/30/20  4:46 AM  Result Value Ref Range   Magnesium 2.1 1.7 - 2.4 mg/dL    Comment: Performed at [facility], [address] [place], [place] [address]  Glucose, capillary     Status: Abnormal   Collection Time: 08/30/20  7:50 AM  Result Value Ref Range   Glucose-Capillary 107 (H) 70 - 99 mg/dL    Comment: Glucose reference range applies only to samples taken after fasting for at least 8 hours.  Glucose, capillary     Status: Abnormal   Collection Time: 08/30/20 11:49 AM  Result Value Ref Range   Glucose-Capillary 116 (H) 70 - 99 mg/dL    Comment: Glucose reference range applies only to samples taken after fasting for at least 8 hours.  Glucose, capillary     Status: None   Collection Time: 08/31/20  8:23 AM  Result Value Ref Range   Glucose-Capillary 86 70 - 99 mg/dL    Comment: Glucose reference range applies only to samples taken after fasting for at least 8 hours.  Glucose, capillary     Status: None   Collection Time: 08/31/20 12:30 PM  Result Value Ref Range   Glucose-Capillary 87 70 - 99 mg/dL    Comment: Glucose reference range applies only to samples taken after fasting for at least 8 hours.  Glucose, capillary     Status: Abnormal   Collection Time: 08/31/20  4:36 PM  Result Value Ref Range   Glucose-Capillary 168 (H) 70 - 99 mg/dL    Comment: Glucose reference range applies only to samples taken after fasting for at least 8 hours.     Current Facility-Administered Medications  Medication Dose Route Frequency Provider Last Rate Last Admin  . acetaminophen (TYLENOL) tablet 650 mg  650 mg Oral Q6H PRN Darrick Meigs, Marge Duncans, MD      . albuterol (VENTOLIN HFA) 108 (90 Base) MCG/ACT inhaler 1-2 puff  1-2 puff Inhalation Q6H PRN Oswald Hillock, MD      . aspirin EC tablet 81 mg  81 mg Oral Daily Oswald Hillock, MD   81 mg at 08/31/20 0825  . clonazePAM (KLONOPIN) tablet 1 mg  1 mg Oral BID Oswald Hillock, MD   1 mg at 08/31/20 3151  . divalproex (DEPAKOTE ER) 24 hr tablet 1,000 mg  1,000 mg Oral QHS Oswald Hillock, MD   1,000 mg at 08/30/20 2055  . docusate sodium (COLACE) capsule 200 mg  200 mg Oral BID Wyvonnia Dusky, MD   200 mg at 08/31/20 7616  . enoxaparin (LOVENOX) injection 40 mg  40 mg Subcutaneous Q24H Oswald Hillock, MD   40 mg at 08/31/20 0827  . guaiFENesin-dextromethorphan (ROBITUSSIN DM) 100-10 MG/5ML syrup 10 mL  10 mL Oral Q4H PRN Oswald Hillock, MD      . hydrALAZINE (APRESOLINE) tablet 50 mg  50 mg Oral Q6H PRN Wyvonnia Dusky, MD      . hydrOXYzine (ATARAX/VISTARIL) tablet 10 mg  10 mg Oral TID PRN Oswald Hillock, MD      . lamoTRIgine (LAMICTAL) tablet 150 mg  150 mg Oral QHS Oswald Hillock, MD   150 mg at 08/30/20 2056  . linagliptin (TRADJENTA) tablet 5  mg  5 mg Oral Daily Oswald Hillock, MD   5 mg at 08/31/20 8341  . LORazepam (ATIVAN) tablet 2 mg  2 mg Oral Q6H PRN Oswald Hillock, MD   2 mg at 08/26/20 2120  . ondansetron (ZOFRAN) tablet 4 mg  4 mg Oral Q6H PRN Oswald Hillock, MD       Or  . ondansetron [facility]) injection 4 mg  4 mg Intravenous Q6H PRN Oswald Hillock, MD      . oxybutynin (DITROPAN) tablet 10 mg  10 mg Oral BID Oswald Hillock, MD   10 mg at 08/31/20 0827  . pantoprazole (PROTONIX) EC tablet 40 mg  40 mg Oral Daily Oswald Hillock, MD   40 mg at 08/31/20 9622  . polyethylene glycol (MIRALAX / GLYCOLAX) packet 17 g  17 g Oral Daily Wyvonnia Dusky, MD   17 g at 08/31/20 0827  . QUEtiapine  (SEROQUEL) tablet 100 mg  100 mg Oral TID Oswald Hillock, MD   100 mg at 08/31/20 1726  . QUEtiapine (SEROQUEL) tablet 400 mg  400 mg Oral QHS Marzetta Lanza, [name], MD   400 mg at 08/30/20 2055  . senna-docusate (Senokot-S) tablet 1 tablet  1 tablet Oral QHS PRN Oswald Hillock, MD      . sertraline (ZOLOFT) tablet 50 mg  50 mg Oral BID Zamariah Seaborn, [name], MD   50 mg at 08/31/20 0826  . simethicone (MYLICON) chewable tablet 80 mg  80 mg Oral TID with meals Oswald Hillock, MD   80 mg at 08/31/20 1727    Musculoskeletal: Strength & Muscle Tone: within normal limits Gait & Station: normal Patient leans: N/A  Psychiatric Specialty Exam: Physical Exam Vitals and nursing note reviewed.  Constitutional:      Appearance: He is well-developed and well-nourished.  HENT:     Head: Normocephalic and atraumatic.  Eyes:     Conjunctiva/sclera: Conjunctivae normal.     Pupils: Pupils are equal, round, and reactive to light.  Cardiovascular:     Heart sounds: Normal heart sounds.  Pulmonary:     Effort: Pulmonary effort is normal.  Abdominal:     Palpations: Abdomen is soft.  Musculoskeletal:        General: Normal range of motion.     Cervical back: Normal range of motion.  Skin:    General: Skin is warm and dry.  Neurological:     General: No focal deficit present.     Mental Status: He is alert.  Psychiatric:        Attention and Perception: He is inattentive.        Mood and Affect: Mood is depressed. Affect is tearful.        Speech: Speech is delayed.        Behavior: Behavior is slowed.        Thought Content: Thought content is not paranoid. Thought content does not include homicidal or suicidal ideation.        Cognition and Memory: Memory is impaired.     Review of Systems  Constitutional: Negative.   HENT: Negative.   Eyes: Negative.   Respiratory: Negative.   Cardiovascular: Negative.   Gastrointestinal: Negative.   Musculoskeletal: Negative.   Skin: Negative.   Neurological:  Negative.   Psychiatric/Behavioral: Positive for dysphoric mood.    Blood pressure 119/76, pulse 67, temperature 97.6 F (36.4 C), resp. rate 10, height 5\' 8"  (1.727 m), weight 104 kg,  SpO2 95 %.Body mass index is 34.86 kg/m.  General Appearance: Disheveled  Eye Contact:  Minimal  Speech:  Slow  Volume:  Decreased  Mood:  Dysphoric  Affect:  Tearful  Thought Process:  Coherent  Orientation:  Negative  Thought Content:  Illogical  Suicidal Thoughts:  No  Homicidal Thoughts:  No  Memory:  Immediate;   Fair Recent;   Poor Remote;   Poor  Judgement:  Impaired  Insight:  Shallow  Psychomotor Activity:  Decreased  Concentration:  Concentration: Poor  Recall:  [employer] of Knowledge:  Fair  Language:  Fair  Akathisia:  No  Handed:  Right  AIMS (if indicated):     Assets:  Desire for Improvement  ADL's:  Impaired  Cognition:  Impaired,  Mild  Sleep:        Treatment Plan Summary: Plan No change to medication.  I do not think there is probably much room to go up on any of it.  Spoke with him for a while offered some reassurance.  Complemented his art work.  We will continue to follow-up regularly.  Disposition: Supportive therapy provided about ongoing stressors.  Alethia Berthold, MD 08/31/2020 6:17 PM

## 2020-08-31 NOTE — Progress Notes (Signed)
Physical Therapy Treatment Patient Details Name: Alexander Beard MRN: [id number] DOB: December 11, 1955 Today's Date: 08/31/2020    History of Present Illness 65 y.o. male with medical history significant for schizoaffective disorder, bipolar type, autism spectrum disorder, COPD, T2DM, HTN, HLD who presents to the ED for evaluation of shortness of breath. Covid-19+ on 08/04/20.  Per brother, patient recently had a prolonged behavioral health hospital stay from Aug - Dec 2021 before discharged to group care home in [place] which is a new environment for him.  Brother states that he has noted significant "grogginess" over the last few weeks since medication schedule changes (4x/day to 2x/day).  This has coincided with several falls that have not resulted in any significant injuries.    PT Comments    Pt sitting in recliner upon PT arrival; agreeable to PT session.  CGA with transfers using RW.  Able to progress to ambulating 80 feet with RW and CGA to min assist x1 (pt requiring almost consistent vc's for taking steps and intermittent vc's to keep walker closer; increased time required for pt to ambulate 80 feet d/t slow cadence).  Will continue to focus on strengthening, balance, and progressive functional mobility during hospitalization.   Follow Up Recommendations  SNF     Equipment Recommendations  Rolling walker with 5" wheels;3in1 (PT)    Recommendations for Other Services       Precautions / Restrictions Precautions Precautions: Fall Restrictions Weight Bearing Restrictions: No    Mobility  Bed Mobility               General bed mobility comments: Deferred (pt in recliner beginning/end of session)    Transfers Overall transfer level: Needs assistance Equipment used: Rolling walker (2 wheeled) Transfers: Sit to/from Stand Sit to Stand: Min guard         General transfer comment: x2 trials; fairly strong stand from recliner; vc's for  safety  Ambulation/Gait Ambulation/Gait assistance: Min guard;Min assist Gait Distance (Feet): 80 Feet Assistive device: Rolling walker (2 wheeled)   Gait velocity: decreased   General Gait Details: pt requiring almost consistent vc's for taking steps and intermittent vc's to keep walker closer; decreased B LE step length and foot clearance   Stairs             Wheelchair Mobility    Modified Rankin (Stroke Patients Only)       Balance Overall balance assessment: Needs assistance Sitting-balance support: No upper extremity supported;Feet supported Sitting balance-Leahy Scale: Good Sitting balance - Comments: steady sitting reaching outside BOS while in recliner putting on socks (pt able to put on R sock on own but required assist for L sock)   Standing balance support: Single extremity supported Standing balance-Leahy Scale: Poor Standing balance comment: pt requiring at least single UE support for static standing balance                            Cognition Arousal/Alertness: Awake/alert Behavior During Therapy: Impulsive;Anxious Overall Cognitive Status: No family/caregiver present to determine baseline cognitive functioning Area of Impairment: Orientation;Attention;Memory;Following commands;Safety/judgement;Awareness;Problem solving                 Orientation Level: Disoriented to;Time Current Attention Level: Focused Memory: Decreased recall of precautions;Decreased short-term memory Following Commands: Follows one step commands inconsistently Safety/Judgement: Decreased awareness of safety;Decreased awareness of deficits Awareness: Intellectual Problem Solving: Slow processing;Requires tactile cues;Requires verbal cues        Exercises  General Comments   Nursing cleared pt for participation in physical therapy.  Pt agreeable to PT session.      Pertinent Vitals/Pain   Vitals (HR and O2 on room air) stable and WFL throughout  treatment session.    Home Living                      Prior Function            PT Goals (current goals can now be found in the care plan section) Acute Rehab PT Goals Patient Stated Goal: to discharge from hospital PT Goal Formulation: With patient Time For Goal Achievement: 09/04/20 Potential to Achieve Goals: Good Progress towards PT goals: Progressing toward goals    Frequency    Min 2X/week      PT Plan Current plan remains appropriate    Co-evaluation              AM-PAC PT "6 Clicks" Mobility   Outcome Measure  Help needed turning from your back to your side while in a flat bed without using bedrails?: None Help needed moving from lying on your back to sitting on the side of a flat bed without using bedrails?: A Little Help needed moving to and from a bed to a chair (including a wheelchair)?: A Little Help needed standing up from a chair using your arms (e.g., wheelchair or bedside chair)?: A Little Help needed to walk in hospital room?: A Little Help needed climbing 3-5 steps with a railing? : A Lot 6 Click Score: 18    End of Session Equipment Utilized During Treatment: Gait belt Activity Tolerance: Patient tolerated treatment well Patient left: in chair;with call bell/phone within reach;with chair alarm set;Other (comment) (posey chair lap belt alarm in place (therapist reviewed with pt how to remove alarm)) Nurse Communication: Mobility status;Precautions PT Visit Diagnosis: Muscle weakness (generalized) (M62.81);Difficulty in walking, not elsewhere classified (R26.2)     Time: 7616-0737 PT Time Calculation (min) (ACUTE ONLY): 45 min  Charges:  $Gait Training: 23-37 mins $Therapeutic Activity: 8-22 mins                    Leitha Bleak, PT 08/31/20, 2:34 PM

## 2020-08-31 NOTE — TOC Progression Note (Signed)
Transition of Care [facility]) - Progression Note    Patient Details  Name: VICTORIA EUCEDA MRN: [id number] Date of Birth: 07/07/1956  [facility] CM/SW Contact  Shelbie Hutching, RN Phone Number: 08/31/2020, 1:17 PM  Clinical Narrative:    Danae Chen from [facility] reports that patient would be a great resident for them, the problem is that the patient makes too much money in [place] for ALF Medicaid and patient does not qualify for Memory Care with his Autism disorder.  Patient still could go to [place] if family is willing to pay the difference.  RNCM spoke with [name] says that he may be willing to pay the difference for as long as he could, he wants to discuss this with [name] the families hired case Freight forwarder and his older brother.  [name] or [name] will call to let this RNCM know what they have decided.     Expected Discharge Plan: Group Home Barriers to Discharge: Continued Medical Work up  Expected Discharge Plan and Services Expected Discharge Plan: Group Home   Discharge Planning Services: CM Consult   Living arrangements for the past 2 months: Group Home                                       Social Determinants of Health (SDOH) Interventions    Readmission Risk Interventions Readmission Risk Prevention Plan 08/07/2020  Transportation Screening Complete  Medication Review (RN Care Manager) Complete  PCP or Specialist appointment within 3-5 days of discharge Complete  HRI or Home Care Consult Complete  SW Recovery Care/Counseling Consult Complete  Palliative Care Screening Not [place] Complete  Some recent data might be hidden

## 2020-09-01 DIAGNOSIS — F840 Autistic disorder: Secondary | ICD-10-CM | POA: Diagnosis not present

## 2020-09-01 NOTE — Progress Notes (Signed)
PROGRESS NOTE   HPI was taken from Dr. Zada Finders: Alexander Beard is a 65 y.o. male with medical history significant for schizoaffective disorder, bipolar type, autism spectrum disorder, COPD, T2DM, HTN, HLD who presents to the ED for evaluation of shortness of breath.  Unable to obtain history from patient due to excessive somnolence and is otherwise obtained from EDP, chart review, and brother by phone.  Per ED documentation, patient was brought to ED via EMS who on their arrival noted patient SPO2 was 92% on room air with coarse crackles in the lungs.  He was placed on 4 L O2 via [place] on route to the ED.  On arrival patient was reportedly alert and interactive however following triage and initial labs he had a change in mental status described as a mumbling speech pattern and minimally responsive.  Spoke with patient's brother by phone.  His brother says that patient recently had a prolonged behavioral health hospital stay from August to December before he was discharged to group care home in [place] which is a new environment for him.  Brother states that previously patient's medication schedule was 4 times daily but since he left the hospital is now taking meds twice daily.  Brother states that he has noted significant "grogginess" over the last few weeks.  This has coincided with several falls that have not resulted in any significant injuries.  ED Course:  Initial vitals showed BP 104/65, pulse 68, RR 18, temp 99.1 F, SPO2 96% on room air.  Labs showed sodium 140, potassium 4.0, bicarb 28, BUN 13, creatinine 0.68, serum glucose 105, WBC 4.0, hemoglobin 14.1, platelets 105,000, high-sensitivity troponin I 4x2, procalcitonin <0.10, lactic acid 2.3, valproic acid level 92, ammonia 28.  VBG showed pH 7.39, PCO2 62.  Urinalysis shows negative nitrates, negative leukocytes, 0-5 RBCs and WBCs/hpf, rare bacteria microscopy.  UDS positive for tricyclic's and benzodiazepines.  Blood cultures  obtained and pending.  SARS-CoV-2 PCR is positive.  2 view chest x-ray shows airspace opacity at the left lung base.  CT head without contrast is negative for acute intracranial abnormality.  Atrophy, chronic microvascular disease noted.  CT cervical spine without contrast shows diffuse degenerative disc and facet disease without acute bony abnormality.  Patient was given 1 L LR and the hospitalist service was consulted to admit for further evaluation and management.   Hospital Course from Dr. Jimmye Norman 2/9-2/18/22: Pt completed remdesivir and steroid course for COVID19 pneumonia. Pt also has been weaned off of oxygen. CM is having trouble with placement issues for the pt. PT/OT recs SNF. Will have psych evaluate.    LAJARVIS ITALIANO  QQI:[id number] DOB: [birth year] DOA: 08/04/2020 PCP: Remi Haggard, FNP  Assessment & Plan:   Principal Problem:   Active autistic disorder Active Problems:   Schizoaffective disorder, bipolar type ([place])   Hypertension   Controlled type 2 diabetes mellitus without complication ([place])   Pneumonia due to COVID-19 virus   Encephalopathy acute   Pneumonia  Acute hypoxic respiratory failure: resolved.  Secondary to COVID19 pneumonia  Completed remdesivir & steroid courses.  Acute metabolic encephalopathy:  likely overmedicated, dose has been reduced. Mental status waxes and wanes   Schizoaffective disorder:  long term group resident. Continue on seroquel, lamictal, Depakote, zoloft & clonoazepam.  Hypokalemia:  --monitor and replete PRN  Thrombocytopenia: etiology unclear, labile.   DM2:  continue on tradjenta --no need for fingersticks and SSI   DVT prophylaxis: lovenox  Code Status: full  Family Communication:  Disposition Plan: PT/OT recs SNF, still waiting on SNF placement.    Level of care: Med-Surg   Status is: Inpatient  Remains inpatient appropriate because:Unsafe d/c plan   Dispo:  Patient From: home   Planned  Disposition: SNF   Expected discharge date: whenever SNF bed or other facility is available   Medically stable for discharge: yes      Consultants:      Procedures:   Antimicrobials:   Subjective: Pt was quite agitated again today, saying that nobody believed what he was drawing was real and insisted that we get his brother to vouch for him.     Objective: Vitals:   09/01/20 0016 09/01/20 0509 09/01/20 0728 09/01/20 1138  BP: 112/63 109/68 139/76 125/80  Pulse: 77 66 65 80  Resp: 17 17 16 17   Temp: 98.4 F (36.9 C) 98.3 F (36.8 C) (!) 97.5 F (36.4 C) 97.6 F (36.4 C)  TempSrc: Oral Oral Oral Oral  SpO2: 94% 94% 99%   Weight:      Height:        Intake/Output Summary (Last 24 hours) at 09/01/2020 1354 Last data filed at 08/31/2020 2206 Gross per 24 hour  Intake 0 ml  Output 400 ml  Net -400 ml   Filed Weights   08/18/20 2304  Weight: 104 kg    Examination:  Constitutional: alert, oriented to self and place, drawing in bed HEENT: conjunctivae and lids normal, EOMI CV: No cyanosis.   RESP: normal respiratory effort, on RA Extremities: No effusions, edema in BLE SKIN: warm, dry Neuro: II - XII grossly intact.   Psych: agitated mood and affect.  Paranoid and delusional.    Data Reviewed: I have personally reviewed following labs and imaging studies  CBC: Recent Labs  Lab 08/28/20 0446 08/30/20 0446  WBC 3.5* 4.7  HGB 13.2 12.5*  HCT 40.1 38.7*  MCV 95.9 97.5  PLT 126* 256*   Basic Metabolic Panel: Recent Labs  Lab 08/28/20 0446 08/30/20 0446  NA 142 142  K 4.5 4.4  CL 104 105  CO2 30 30  GLUCOSE 128* 115*  BUN 13 17  CREATININE 0.72 0.76  CALCIUM 9.3 8.9  MG  --  2.1   GFR: Estimated Creatinine Clearance: 109 mL/min (by C-G formula based on SCr of 0.76 mg/dL). Liver Function Tests: No results for input(s): AST, ALT, ALKPHOS, BILITOT, PROT, ALBUMIN in the last 168 hours. No results for input(s): LIPASE, AMYLASE in the last 168  hours. No results for input(s): AMMONIA in the last 168 hours. Coagulation Profile: No results for input(s): INR, PROTIME in the last 168 hours. Cardiac Enzymes: No results for input(s): CKTOTAL, CKMB, CKMBINDEX, TROPONINI in the last 168 hours. BNP (last 3 results) No results for input(s): PROBNP in the last 8760 hours. HbA1C: No results for input(s): HGBA1C in the last 72 hours. CBG: Recent Labs  Lab 08/30/20 1149 08/31/20 0823 08/31/20 1230 08/31/20 1636 08/31/20 2117  GLUCAP 116* 86 87 168* 314*   Lipid Profile: No results for input(s): CHOL, HDL, LDLCALC, TRIG, CHOLHDL, LDLDIRECT in the last 72 hours. Thyroid Function Tests: No results for input(s): TSH, T4TOTAL, FREET4, T3FREE, THYROIDAB in the last 72 hours. Anemia Panel: No results for input(s): VITAMINB12, FOLATE, FERRITIN, TIBC, IRON, RETICCTPCT in the last 72 hours. Sepsis Labs: No results for input(s): PROCALCITON, LATICACIDVEN in the last 168 hours.  No results found for this or any previous visit (from the past 240 hour(s)).  Radiology Studies: No results found.      Scheduled Meds: . aspirin EC  81 mg Oral Daily  . clonazePAM  1 mg Oral BID  . divalproex  1,000 mg Oral QHS  . docusate sodium  200 mg Oral BID  . enoxaparin (LOVENOX) injection  40 mg Subcutaneous Q24H  . lamoTRIgine  150 mg Oral QHS  . linagliptin  5 mg Oral Daily  . oxybutynin  10 mg Oral BID  . pantoprazole  40 mg Oral Daily  . polyethylene glycol  17 g Oral Daily  . QUEtiapine  100 mg Oral TID  . QUEtiapine  400 mg Oral QHS  . sertraline  50 mg Oral BID  . simethicone  80 mg Oral TID with meals   Continuous Infusions:   LOS: 27 days    Enzo Bi, MD [facility] Pager [phone]  If 7PM-7AM, please contact night-coverage 09/01/2020, 1:54 PM

## 2020-09-02 DIAGNOSIS — F840 Autistic disorder: Secondary | ICD-10-CM | POA: Diagnosis not present

## 2020-09-02 LAB — GLUCOSE, CAPILLARY: Glucose-Capillary: 125 mg/dL — ABNORMAL HIGH (ref 70–99)

## 2020-09-02 MED ORDER — TUBERCULIN PPD 5 UNIT/0.1ML ID SOLN
5.0000 [IU] | Freq: Once | INTRADERMAL | Status: AC
  Administered 2020-09-02: 5 [IU] via INTRADERMAL
  Filled 2020-09-02: qty 0.1

## 2020-09-02 NOTE — Progress Notes (Signed)
Physical Therapy Treatment Patient Details Name: Alexander Beard MRN: [id number] DOB: January 20, 1956 Today's Date: 09/02/2020    History of Present Illness 65 y.o. male with medical history significant for schizoaffective disorder, bipolar type, autism spectrum disorder, COPD, T2DM, HTN, HLD who presents to the ED for evaluation of shortness of breath. Covid-19+ on 08/04/20.  Per brother, patient recently had a prolonged behavioral health hospital stay from Aug - Dec 2021 before discharged to group care home in [place] which is a new environment for him.  Brother states that he has noted significant "grogginess" over the last few weeks since medication schedule changes (4x/day to 2x/day).  This has coincided with several falls that have not resulted in any significant injuries.    PT Comments    Pt sitting in recliner upon PT arrival; agreeable to PT session.  Pt able to ambulate 30 feet with RW but limited distance d/t pt suddenly leaning to L and having difficulty following cues to correct posture for safety/balance; limited ambulation after 10 feet (2nd trial ambulation) d/t pt having difficulty staying upright again with max vc's.  Additional gait impairments noted (see below for details) requiring consistent vc's and assist for safety and technique.  Will continue to focus on strengthening, balance, and progressive functional mobility during hospitalization.   Follow Up Recommendations  SNF     Equipment Recommendations  Rolling walker with 5" wheels;3in1 (PT)    Recommendations for Other Services       Precautions / Restrictions Precautions Precautions: Fall Restrictions Weight Bearing Restrictions: No    Mobility  Bed Mobility               General bed mobility comments: Deferred (pt in recliner beginning/end of session)    Transfers Overall transfer level: Needs assistance Equipment used: Rolling walker (2 wheeled) Transfers: Sit to/from Stand Sit to Stand: Min  guard         General transfer comment: x2 trials from recliner; fairly strong stand from recliner; vc's for safety  Ambulation/Gait Ambulation/Gait assistance: Min guard;Min assist Gait Distance (Feet):  (30 feet; 10 feet) Assistive device: Rolling walker (2 wheeled)   Gait velocity: decreased (very slow gait requiring vc's for taking steps)   General Gait Details: Pt requiring vc's for upright posture (pt tending to flex at knees/hips with forward trunk), vc's for staying closer to RW, vc's for softer steps (pt "stomping" feet at times when advancing LE's).  Limited ambulation to 30 feet 1st trial d/t pt suddenly leaning to L and having difficulty following cues to correct posture for safety/balance; limited ambulation after 10 feet d/t pt having difficulty staying upright again with max vc's.   Stairs             Wheelchair Mobility    Modified Rankin (Stroke Patients Only)       Balance Overall balance assessment: Needs assistance Sitting-balance support: No upper extremity supported;Feet supported Sitting balance-Leahy Scale: Good Sitting balance - Comments: steady sitting reaching outside BOS   Standing balance support: Single extremity supported Standing balance-Leahy Scale: Poor Standing balance comment: pt requiring at least single UE support for static standing balance                            Cognition Arousal/Alertness: Awake/alert Behavior During Therapy: Impulsive;Anxious Overall Cognitive Status: No family/caregiver present to determine baseline cognitive functioning Area of Impairment: Orientation;Attention;Memory;Following commands;Safety/judgement;Awareness;Problem solving  Orientation Level: Disoriented to;Time Current Attention Level: Focused Memory: Decreased recall of precautions;Decreased short-term memory Following Commands: Follows one step commands inconsistently Safety/Judgement: Decreased awareness of  safety;Decreased awareness of deficits Awareness: Intellectual Problem Solving: Slow processing;Requires tactile cues;Requires verbal cues        Exercises      General Comments   Nursing cleared pt for participation in physical therapy.  Pt agreeable to PT session.      Pertinent Vitals/Pain Pain Assessment: No/denies pain  Vitals (HR and O2 on room air) stable and WFL throughout treatment session.    Home Living                      Prior Function            PT Goals (current goals can now be found in the care plan section) Acute Rehab PT Goals Patient Stated Goal: to discharge from hospital PT Goal Formulation: With patient Time For Goal Achievement: 09/04/20 Potential to Achieve Goals: Good Progress towards PT goals: Progressing toward goals    Frequency    Min 2X/week      PT Plan Current plan remains appropriate    Co-evaluation              AM-PAC PT "6 Clicks" Mobility   Outcome Measure  Help needed turning from your back to your side while in a flat bed without using bedrails?: None Help needed moving from lying on your back to sitting on the side of a flat bed without using bedrails?: A Little Help needed moving to and from a bed to a chair (including a wheelchair)?: A Little Help needed standing up from a chair using your arms (e.g., wheelchair or bedside chair)?: A Little Help needed to walk in hospital room?: A Little Help needed climbing 3-5 steps with a railing? : A Lot 6 Click Score: 18    End of Session Equipment Utilized During Treatment: Gait belt Activity Tolerance: Patient tolerated treatment well Patient left: in chair (with OT present for OT session (OT to set pt up when done with session)) Nurse Communication: Mobility status;Precautions PT Visit Diagnosis: Muscle weakness (generalized) (M62.81);Difficulty in walking, not elsewhere classified (R26.2)     Time: 2229-7989 PT Time Calculation (min) (ACUTE ONLY): 23  min  Charges:  $Therapeutic Activity: 8-22 mins $Neuromuscular Re-education: 8-22 mins                    Leitha Bleak, PT 09/02/20, 1:23 PM

## 2020-09-02 NOTE — Progress Notes (Signed)
PPD administered Lot [id number] Exp 10/27/21

## 2020-09-02 NOTE — Progress Notes (Signed)
PROGRESS NOTE   HPI was taken from Dr. Zada Finders: Alexander Beard is a 65 y.o. male with medical history significant for schizoaffective disorder, bipolar type, autism spectrum disorder, COPD, T2DM, HTN, HLD who presents to the ED for evaluation of shortness of breath.  Unable to obtain history from patient due to excessive somnolence and is otherwise obtained from EDP, chart review, and brother by phone.  Per ED documentation, patient was brought to ED via EMS who on their arrival noted patient SPO2 was 92% on room air with coarse crackles in the lungs.  He was placed on 4 L O2 via [place] on route to the ED.  On arrival patient was reportedly alert and interactive however following triage and initial labs he had a change in mental status described as a mumbling speech pattern and minimally responsive.  Spoke with patient's brother by phone.  His brother says that patient recently had a prolonged behavioral health hospital stay from August to December before he was discharged to group care home in [place] which is a new environment for him.  Brother states that previously patient's medication schedule was 4 times daily but since he left the hospital is now taking meds twice daily.  Brother states that he has noted significant "grogginess" over the last few weeks.  This has coincided with several falls that have not resulted in any significant injuries.  ED Course:  Initial vitals showed BP 104/65, pulse 68, RR 18, temp 99.1 F, SPO2 96% on room air.  Labs showed sodium 140, potassium 4.0, bicarb 28, BUN 13, creatinine 0.68, serum glucose 105, WBC 4.0, hemoglobin 14.1, platelets 105,000, high-sensitivity troponin I 4x2, procalcitonin <0.10, lactic acid 2.3, valproic acid level 92, ammonia 28.  VBG showed pH 7.39, PCO2 62.  Urinalysis shows negative nitrates, negative leukocytes, 0-5 RBCs and WBCs/hpf, rare bacteria microscopy.  UDS positive for tricyclic's and benzodiazepines.  Blood cultures  obtained and pending.  SARS-CoV-2 PCR is positive.  2 view chest x-ray shows airspace opacity at the left lung base.  CT head without contrast is negative for acute intracranial abnormality.  Atrophy, chronic microvascular disease noted.  CT cervical spine without contrast shows diffuse degenerative disc and facet disease without acute bony abnormality.  Patient was given 1 L LR and the hospitalist service was consulted to admit for further evaluation and management.   Hospital Course from Dr. Jimmye Norman 2/9-2/18/22: Pt completed remdesivir and steroid course for COVID19 pneumonia. Pt also has been weaned off of oxygen. CM is having trouble with placement issues for the pt. PT/OT recs SNF. Will have psych evaluate.    Alexander Beard  YBO:[id number] DOB: Jul 13, 1955 DOA: 08/04/2020 PCP: Remi Haggard, FNP  Assessment & Plan:   Principal Problem:   Active autistic disorder Active Problems:   Schizoaffective disorder, bipolar type ([place])   Hypertension   Controlled type 2 diabetes mellitus without complication ([place])   Pneumonia due to COVID-19 virus   Encephalopathy acute   Pneumonia  Acute hypoxic respiratory failure: resolved.  Secondary to COVID19 pneumonia  Completed remdesivir & steroid courses.  Acute metabolic encephalopathy:  likely overmedicated, dose has been reduced. Mental status waxes and wanes   Schizoaffective disorder:  long term group resident. Continue on seroquel, lamictal, Depakote, zoloft & clonoazepam.  Hypokalemia:  --monitor and replete PRN  Thrombocytopenia: etiology unclear, labile.   DM2:  continue on tradjenta --fast BG check every morning   DVT prophylaxis: lovenox  Code Status: full  Family Communication:  Disposition  Plan: PT/OT recs SNF, still waiting on SNF placement.    Level of care: Med-Surg   Status is: Inpatient  Remains inpatient appropriate because:Unsafe d/c plan   Dispo:  Patient From: home   Planned Disposition:  SNF   Expected discharge date: whenever SNF bed or other facility is available   Medically stable for discharge: yes      Consultants:      Procedures:   Antimicrobials:   Subjective: Pt perseverates on how other people don't believe him and gets very upset about it.  Today, it was about how people don't believe him when he said dinosaurs existed.    Objective: Vitals:   09/02/20 0055 09/02/20 0500 09/02/20 0821 09/02/20 1227  BP: 125/72 115/74 103/71 (!) 108/92  Pulse: 70 67 75 74  Resp: 17 15 14 17   Temp: 98 F (36.7 C) 97.7 F (36.5 C) 98.3 F (36.8 C) 97.9 F (36.6 C)  TempSrc:    Oral  SpO2: 94% 98% 92% 99%  Weight:      Height:        Intake/Output Summary (Last 24 hours) at 09/02/2020 1321 Last data filed at 09/02/2020 1025 Gross per 24 hour  Intake 120 ml  Output 100 ml  Net 20 ml   Filed Weights   08/18/20 2304  Weight: 104 kg    Examination:  Constitutional: NAD, alert, oriented to self and place, sitting in chair HEENT: conjunctivae and lids normal, EOMI CV: No cyanosis.   RESP: normal respiratory effort, on RA Neuro: II - XII grossly intact.   Psych: agitated mood and affect.     Data Reviewed: I have personally reviewed following labs and imaging studies  CBC: Recent Labs  Lab 08/28/20 0446 08/30/20 0446  WBC 3.5* 4.7  HGB 13.2 12.5*  HCT 40.1 38.7*  MCV 95.9 97.5  PLT 126* 245*   Basic Metabolic Panel: Recent Labs  Lab 08/28/20 0446 08/30/20 0446  NA 142 142  K 4.5 4.4  CL 104 105  CO2 30 30  GLUCOSE 128* 115*  BUN 13 17  CREATININE 0.72 0.76  CALCIUM 9.3 8.9  MG  --  2.1   GFR: Estimated Creatinine Clearance: 109 mL/min (by C-G formula based on SCr of 0.76 mg/dL). Liver Function Tests: No results for input(s): AST, ALT, ALKPHOS, BILITOT, PROT, ALBUMIN in the last 168 hours. No results for input(s): LIPASE, AMYLASE in the last 168 hours. No results for input(s): AMMONIA in the last 168 hours. Coagulation  Profile: No results for input(s): INR, PROTIME in the last 168 hours. Cardiac Enzymes: No results for input(s): CKTOTAL, CKMB, CKMBINDEX, TROPONINI in the last 168 hours. BNP (last 3 results) No results for input(s): PROBNP in the last 8760 hours. HbA1C: No results for input(s): HGBA1C in the last 72 hours. CBG: Recent Labs  Lab 08/30/20 1149 08/31/20 0823 08/31/20 1230 08/31/20 1636 08/31/20 2117  GLUCAP 116* 86 87 168* 314*   Lipid Profile: No results for input(s): CHOL, HDL, LDLCALC, TRIG, CHOLHDL, LDLDIRECT in the last 72 hours. Thyroid Function Tests: No results for input(s): TSH, T4TOTAL, FREET4, T3FREE, THYROIDAB in the last 72 hours. Anemia Panel: No results for input(s): VITAMINB12, FOLATE, FERRITIN, TIBC, IRON, RETICCTPCT in the last 72 hours. Sepsis Labs: No results for input(s): PROCALCITON, LATICACIDVEN in the last 168 hours.  No results found for this or any previous visit (from the past 240 hour(s)).       Radiology Studies: No results found.  Scheduled Meds: . aspirin EC  81 mg Oral Daily  . clonazePAM  1 mg Oral BID  . divalproex  1,000 mg Oral QHS  . docusate sodium  200 mg Oral BID  . enoxaparin (LOVENOX) injection  40 mg Subcutaneous Q24H  . lamoTRIgine  150 mg Oral QHS  . linagliptin  5 mg Oral Daily  . oxybutynin  10 mg Oral BID  . pantoprazole  40 mg Oral Daily  . polyethylene glycol  17 g Oral Daily  . QUEtiapine  100 mg Oral TID  . QUEtiapine  400 mg Oral QHS  . sertraline  50 mg Oral BID  . simethicone  80 mg Oral TID with meals   Continuous Infusions:   LOS: 28 days    Enzo Bi, MD [facility] Pager [phone]  If 7PM-7AM, please contact night-coverage 09/02/2020, 1:21 PM

## 2020-09-02 NOTE — Evaluation (Signed)
Occupational Therapy Evaluation Patient Details Name: Alexander Beard MRN: [id number] DOB: 08-Aug-1955 Today's Date: 09/02/2020    History of Present Illness 65 y.o. male with medical history significant for schizoaffective disorder, bipolar type, autism spectrum disorder, COPD, T2DM, HTN, HLD who presents to the ED for evaluation of shortness of breath. Covid-19+ on 08/04/20.  Per brother, patient recently had a prolonged behavioral health hospital stay from Aug - Dec 2021 before discharged to group care home in [place] which is a new environment for him.  Brother states that he has noted significant "grogginess" over the last few weeks since medication schedule changes (4x/day to 2x/day).  This has coincided with several falls that have not resulted in any significant injuries.   Clinical Impression   Mr. Thrush was seen today for OT re-evaluation and treatment. Asked about goals he hopes to achieve during his hospital stay, [name] listed the following: "I want to do things for myself. I want to be able to move around better. I want to take care of myself. I want to have self-confidence. I want to be helpful and useful to others. 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 I want to be a person of worth."  Pt expresses his interest in grooming/bathing, and, given VCs for initiation and continuing, completes these tasks w/o physical assistance from therapist. Following set-up, pt able to engage in self-feeding and drinking with CGA/Min A, although with spillage of both food and drink, given BUE tremors. Following these ADL tasks, 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 Therapist provided instruction as to how pt could better position himself in chair to more easily access table and reduce risk of dropping his art materials. Pt was eager to demonstrate to therapist what he had been drawing and to articulate why had had selected particular colors for his  artwork. Therapist assured pt that staff here would assist him in realizing the goals he had mentioned, while commenting that [name] had already achieved the goal of "being a person of worth." Pt became tearful and stated that "I like being here" and that "people here are nice." Mr. Cheese will continue to benefit from ongoing OT services while hospitalized. While discharge to a SNF could be appropriate, a group home with close 24/7 supervision may also suit his present needs and capabilities.    Follow Up Recommendations  SNF;Other (comment) (Group home?)    Equipment Recommendations       Recommendations for Other Services       Precautions / Restrictions Precautions Precautions: Fall Restrictions Weight Bearing Restrictions: No      Mobility Bed Mobility Overal bed mobility: Needs Assistance             General bed mobility comments: Deferred (pt in recliner beginning/end of session)    Transfers Overall transfer level: Needs assistance Equipment used: Rolling walker (2 wheeled) Transfers: Sit to/from Stand Sit to Stand: Min guard         General transfer comment: x2 trials from recliner; fairly strong stand from recliner; vc's for safety    Balance Overall balance assessment: Needs assistance Sitting-balance support: No upper extremity supported;Feet supported Sitting balance-Leahy Scale: Good Sitting balance - Comments: steady sitting reaching outside BOS; requires VCs to maintain upright posture, conducive to engaging in seated art activity   Standing balance support: Single extremity supported Standing balance-Leahy Scale: Poor Standing balance comment: pt requiring at least  single UE support for static standing balance                           ADL either performed or assessed with clinical judgement   ADL Overall ADL's : Needs assistance/impaired Eating/Feeding: Minimal assistance;Sitting Eating/Feeding Details (indicate cue type and  reason): feeding challenging 2/2 tremors Grooming: Wash/dry hands;Sitting;Min guard;Set up;Brushing hair;Wash/dry face Grooming Details (indicate cue type and reason): Requires VCs for task initiation Upper Body Bathing: Sitting;Set up;Supervision/ safety       Upper Body Dressing : Sitting;Minimal assistance                   Functional mobility during ADLs: Cueing for safety;Cueing for sequencing;Rolling walker       Vision Patient Visual Report: No change from baseline       Perception     Praxis      Pertinent Vitals/Pain Pain Assessment: No/denies pain     Hand Dominance     Extremity/Trunk Assessment Upper Extremity Assessment Upper Extremity Assessment: Generalized weakness (bilateral UE tremors)   Lower Extremity Assessment Lower Extremity Assessment: Generalized weakness       Communication Communication Communication: No difficulties   Cognition Arousal/Alertness: Awake/alert Behavior During Therapy: WFL for tasks assessed/performed Overall Cognitive Status: History of cognitive impairments - at baseline Area of Impairment: Orientation;Attention;Memory;Following commands;Safety/judgement;Awareness;Problem solving                 Orientation Level: Disoriented to;Time Current Attention Level: Focused Memory: Decreased recall of precautions;Decreased short-term memory Following Commands: Follows one step commands consistently Safety/Judgement: Decreased awareness of safety;Decreased awareness of deficits Awareness: Intellectual Problem Solving: Slow processing;Requires tactile cues;Requires verbal cues General Comments: Oriented to self, place. Unaware of month and day, but correctly able to state year   General Comments       Exercises Other Exercises Other Exercises: Repositioning in chair, grooming, dressing, cognitive orientation, transfers; relaxation techniques   Shoulder Instructions      Home Living Family/patient expects to  be discharged to:: Group home                                 Additional Comments: Pt has been living at a group home since Dec 2021. Prior to that, hospitalized in behavioral health unit from Aug-Dec 2021      Prior Functioning/Environment Level of Independence: Needs assistance  Gait / Transfers Assistance Needed: Per previous therapy evaluation "Per brother, pt was not using any DME for functional mobility prior to his Aug-Dec hospitalization, and is unsure how pt has been moving since last admission. Pt's brother reports that pt has LE neuropathy at baseline, which has contributed to his hx of falls. Reports that he has fallen much more frequently since living in the group home" ADL's / Homemaking Assistance Needed: Per previous therapy evaluation "Pt's brother reports that he needed assistance with donning compression socks prior to his Aug-Dec hospitalization, and is unsure how much assistance he was receiving for dressing in group home. Per pt's brother, pt needed MIN-MOD A for bathing to ensure thoroughness"            OT Problem List:        OT Treatment/Interventions: Self-care/ADL training;Therapeutic exercise;Energy conservation;DME and/or AE instruction;Therapeutic activities;Patient/family education;Balance training    OT Goals(Current goals can be found in the care plan section) Acute Rehab OT Goals Patient Stated Goal: to be  able to do things for myself OT Goal Formulation: With patient Time For Goal Achievement: 09/17/20 Potential to Achieve Goals: Good  OT Frequency: Min 2X/week   Barriers to D/C:            Co-evaluation              AM-PAC OT "6 Clicks" Daily Activity     Outcome Measure Help from another person eating meals?: A Little Help from another person taking care of personal grooming?: A Little Help from another person toileting, which includes using toliet, bedpan, or urinal?: A Lot Help from another person bathing (including  washing, rinsing, drying)?: A Lot Help from another person to put on and taking off regular upper body clothing?: A Little Help from another person to put on and taking off regular lower body clothing?: A Lot 6 Click Score: 15   End of Session    Activity Tolerance: Patient tolerated treatment well Patient left: in chair;with call bell/phone within reach;with chair alarm set  OT Visit Diagnosis: Unsteadiness on feet (R26.81);History of falling (Z91.81);Muscle weakness (generalized) (M62.81);Other symptoms and signs involving cognitive function                Time: 9532-0233 OT Time Calculation (min): 26 min Charges:  OT General Charges $OT Visit: 1 Visit OT Evaluation $OT Re-eval: 1 Re-eval OT Treatments $Self Care/Home Management : 23-37 mins  Josiah Lobo, PhD, [place], OTR/L ascom [phone] 09/02/20, 3:03 PM

## 2020-09-02 NOTE — TOC Progression Note (Signed)
Transition of Care [facility]) - Progression Note    Patient Details  Name: Alexander Beard MRN: [id number] Date of Birth: 31-Aug-1955  [facility] CM/SW Contact  Shelbie Hutching, RN Phone Number: 09/02/2020, 10:49 AM  Clinical Narrative:    RNCM spoke with [name] this morning about what the family has decided to do.  [name] says that he absolutely does not want patient to return to group home where he was.  [name] says that they will private pay the difference he is just waiting for [name], their case manager to call him back to discuss the facility a little more.    Expected Discharge Plan: Group Home Barriers to Discharge: Continued Medical Work up  Expected Discharge Plan and Services Expected Discharge Plan: Group Home   Discharge Planning Services: CM Consult   Living arrangements for the past 2 months: Group Home                                       Social Determinants of Health (SDOH) Interventions    Readmission Risk Interventions Readmission Risk Prevention Plan 08/07/2020  Transportation Screening Complete  Medication Review (RN Care Manager) Complete  PCP or Specialist appointment within 3-5 days of discharge Complete  HRI or Home Care Consult Complete  SW Recovery Care/Counseling Consult Complete  Palliative Care Screening Not [place] Complete  Some recent data might be hidden

## 2020-09-03 DIAGNOSIS — F840 Autistic disorder: Secondary | ICD-10-CM | POA: Diagnosis not present

## 2020-09-03 LAB — CBC
HCT: 40.3 % (ref 39.0–52.0)
Hemoglobin: 13.5 g/dL (ref 13.0–17.0)
MCH: 32.4 pg (ref 26.0–34.0)
MCHC: 33.5 g/dL (ref 30.0–36.0)
MCV: 96.6 fL (ref 80.0–100.0)
Platelets: 173 10*3/uL (ref 150–400)
RBC: 4.17 MIL/uL — ABNORMAL LOW (ref 4.22–5.81)
RDW: 14.5 % (ref 11.5–15.5)
WBC: 4.3 10*3/uL (ref 4.0–10.5)
nRBC: 0 % (ref 0.0–0.2)

## 2020-09-03 LAB — GLUCOSE, CAPILLARY: Glucose-Capillary: 146 mg/dL — ABNORMAL HIGH (ref 70–99)

## 2020-09-03 MED ORDER — LURASIDONE HCL 40 MG PO TABS
40.0000 mg | ORAL_TABLET | Freq: Every day | ORAL | Status: DC
  Administered 2020-09-04 – 2020-09-09 (×6): 40 mg via ORAL
  Filled 2020-09-03 (×6): qty 1

## 2020-09-03 NOTE — Progress Notes (Addendum)
PROGRESS NOTE   HPI was taken from Dr. Zada Finders: Alexander Beard is a 65 y.o. male with medical history significant for schizoaffective disorder, bipolar type, autism spectrum disorder, COPD, T2DM, HTN, HLD who presents to the ED for evaluation of shortness of breath.  Unable to obtain history from patient due to excessive somnolence and is otherwise obtained from EDP, chart review, and brother by phone.  Per ED documentation, patient was brought to ED via EMS who on their arrival noted patient SPO2 was 92% on room air with coarse crackles in the lungs.  He was placed on 4 L O2 via [place] on route to the ED.  On arrival patient was reportedly alert and interactive however following triage and initial labs he had a change in mental status described as a mumbling speech pattern and minimally responsive.  Spoke with patient's brother by phone.  His brother says that patient recently had a prolonged behavioral health hospital stay from August to December before he was discharged to group care home in [place] which is a new environment for him.  Brother states that previously patient's medication schedule was 4 times daily but since he left the hospital is now taking meds twice daily.  Brother states that he has noted significant "grogginess" over the last few weeks.  This has coincided with several falls that have not resulted in any significant injuries.  ED Course:  Initial vitals showed BP 104/65, pulse 68, RR 18, temp 99.1 F, SPO2 96% on room air.  Labs showed sodium 140, potassium 4.0, bicarb 28, BUN 13, creatinine 0.68, serum glucose 105, WBC 4.0, hemoglobin 14.1, platelets 105,000, high-sensitivity troponin I 4x2, procalcitonin <0.10, lactic acid 2.3, valproic acid level 92, ammonia 28.  VBG showed pH 7.39, PCO2 62.  Urinalysis shows negative nitrates, negative leukocytes, 0-5 RBCs and WBCs/hpf, rare bacteria microscopy.  UDS positive for tricyclic's and benzodiazepines.  Blood cultures  obtained and pending.  SARS-CoV-2 PCR is positive.  2 view chest x-ray shows airspace opacity at the left lung base.  CT head without contrast is negative for acute intracranial abnormality.  Atrophy, chronic microvascular disease noted.  CT cervical spine without contrast shows diffuse degenerative disc and facet disease without acute bony abnormality.  Patient was given 1 L LR and the hospitalist service was consulted to admit for further evaluation and management.   Hospital Course from Dr. Jimmye Norman 2/9-2/18/22: Pt completed remdesivir and steroid course for COVID19 pneumonia. Pt also has been weaned off of oxygen. CM is having trouble with placement issues for the pt. PT/OT recs SNF. Will have psych evaluate.    Alexander Beard  KNL:[id number] DOB: 07-17-1955 DOA: 08/04/2020 PCP: Remi Haggard, FNP  Assessment & Plan:   Principal Problem:   Active autistic disorder Active Problems:   Schizoaffective disorder, bipolar type ([place])   Hypertension   Controlled type 2 diabetes mellitus without complication ([place])   Pneumonia due to COVID-19 virus   Encephalopathy acute   Pneumonia  Acute hypoxic respiratory failure: resolved.  Secondary to COVID19 pneumonia  Completed remdesivir & steroid courses.  Acute metabolic encephalopathy:  likely overmedicated, dose has been reduced. Mental status waxes and wanes   Schizoaffective disorder:  long term group resident. Continue on seroquel, lamictal, Depakote, zoloft & clonoazepam.  Hypokalemia:  --monitor and replete PRN  Thrombocytopenia: etiology unclear, labile.   DM2:  continue on tradjenta --fast BG check every morning   DVT prophylaxis: lovenox  Code Status: full  Family Communication:  Disposition  Plan: PT/OT recs SNF, still waiting on SNF placement.    Level of care: Med-Surg   Status is: Inpatient  Remains inpatient appropriate because:Unsafe d/c plan   Dispo:  Patient From: home   Planned Disposition:  SNF   Expected discharge date: whenever SNF bed or other facility is available   Medically stable for discharge: yes      Consultants:      Procedures:   Antimicrobials:   Subjective: Pt was eating lunch and said he enjoyed his food.  Pt appears to be more calm and in better mood when he is eating.   Objective: Vitals:   09/03/20 0021 09/03/20 0533 09/03/20 0727 09/03/20 1202  BP: 111/75 126/80 132/82 107/67  Pulse: 89 73 80 73  Resp: 16 16 16 18   Temp: 97.9 F (36.6 C) 98.2 F (36.8 C) 97.8 F (36.6 C) 98 F (36.7 C)  TempSrc: Oral Oral Oral   SpO2: 97% 98% 97% 100%  Weight:      Height:        Intake/Output Summary (Last 24 hours) at 09/03/2020 1441 Last data filed at 09/03/2020 1300 Gross per 24 hour  Intake 120 ml  Output 1300 ml  Net -1180 ml   Filed Weights   08/18/20 2304  Weight: 104 kg    Examination:  Constitutional: NAD, alert, oriented, siting up and eating HEENT: conjunctivae and lids normal, EOMI CV: No cyanosis.   RESP: normal respiratory effort, on RA Extremities: No effusions, edema in BLE SKIN: warm, dry Neuro: II - XII grossly intact.   Psych: better mood and affect today    Data Reviewed: I have personally reviewed following labs and imaging studies  CBC: Recent Labs  Lab 08/28/20 0446 08/30/20 0446 09/03/20 0505  WBC 3.5* 4.7 4.3  HGB 13.2 12.5* 13.5  HCT 40.1 38.7* 40.3  MCV 95.9 97.5 96.6  PLT 126* 132* 833   Basic Metabolic Panel: Recent Labs  Lab 08/28/20 0446 08/30/20 0446  NA 142 142  K 4.5 4.4  CL 104 105  CO2 30 30  GLUCOSE 128* 115*  BUN 13 17  CREATININE 0.72 0.76  CALCIUM 9.3 8.9  MG  --  2.1   GFR: Estimated Creatinine Clearance: 109 mL/min (by C-G formula based on SCr of 0.76 mg/dL). Liver Function Tests: No results for input(s): AST, ALT, ALKPHOS, BILITOT, PROT, ALBUMIN in the last 168 hours. No results for input(s): LIPASE, AMYLASE in the last 168 hours. No results for input(s): AMMONIA in  the last 168 hours. Coagulation Profile: No results for input(s): INR, PROTIME in the last 168 hours. Cardiac Enzymes: No results for input(s): CKTOTAL, CKMB, CKMBINDEX, TROPONINI in the last 168 hours. BNP (last 3 results) No results for input(s): PROBNP in the last 8760 hours. HbA1C: No results for input(s): HGBA1C in the last 72 hours. CBG: Recent Labs  Lab 08/31/20 0823 08/31/20 1230 08/31/20 1636 08/31/20 2117 09/02/20 2205  GLUCAP 86 87 168* 314* 125*   Lipid Profile: No results for input(s): CHOL, HDL, LDLCALC, TRIG, CHOLHDL, LDLDIRECT in the last 72 hours. Thyroid Function Tests: No results for input(s): TSH, T4TOTAL, FREET4, T3FREE, THYROIDAB in the last 72 hours. Anemia Panel: No results for input(s): VITAMINB12, FOLATE, FERRITIN, TIBC, IRON, RETICCTPCT in the last 72 hours. Sepsis Labs: No results for input(s): PROCALCITON, LATICACIDVEN in the last 168 hours.  No results found for this or any previous visit (from the past 240 hour(s)).       Radiology Studies: No  results found.      Scheduled Meds: . aspirin EC  81 mg Oral Daily  . clonazePAM  1 mg Oral BID  . divalproex  1,000 mg Oral QHS  . docusate sodium  200 mg Oral BID  . enoxaparin (LOVENOX) injection  40 mg Subcutaneous Q24H  . lamoTRIgine  150 mg Oral QHS  . linagliptin  5 mg Oral Daily  . oxybutynin  10 mg Oral BID  . pantoprazole  40 mg Oral Daily  . polyethylene glycol  17 g Oral Daily  . QUEtiapine  100 mg Oral TID  . QUEtiapine  400 mg Oral QHS  . sertraline  50 mg Oral BID  . simethicone  80 mg Oral TID with meals  . tuberculin  5 Units Intradermal Once   Continuous Infusions:   LOS: 29 days    Enzo Bi, MD [facility] Pager [phone]  If 7PM-7AM, please contact night-coverage 09/03/2020, 2:41 PM

## 2020-09-03 NOTE — Progress Notes (Signed)
   09/03/20 1310  Clinical Encounter Type  Visited With Patient  Visit Type Follow-up;Spiritual support;Social support  Referral From Chaplain  Consult/Referral To Chaplain  Follow up with Pt I had seen many times on another floor. Stop by to see how he was doing. Talk to Pt for a while. Pt said he feels fine in his body. Pt say he is moving to another place. I told the Pt I hope the best for him. Ch will follow up later.

## 2020-09-03 NOTE — Consult Note (Signed)
[facility] Consult   Reason for Consult: Follow-up consult 65 year old man with autistic spectrum disorder possible schizoaffective disorder currently in the hospital awaiting placement Referring Physician:  Billie Ruddy Patient Identification: Alexander Beard MRN:  [id number] Principal Diagnosis: Active autistic disorder Diagnosis:  Principal Problem:   Active autistic disorder Active Problems:   Schizoaffective disorder, bipolar type ([place])   Hypertension   Controlled type 2 diabetes mellitus without complication ([place])   Pneumonia due to COVID-19 virus   Encephalopathy acute   Pneumonia   Total Time spent with patient: 30 minutes  Subjective:   Alexander Beard is a 65 y.o. male patient admitted with "I think may be the ladies are upset with me".  HPI: Patient seen chart reviewed.  Patient was awake and in bed watching television.  Recognized me.  Affect anxious and withdrawn a bit.  He tells me he feels like "the ladies" are upset with him.  I ask him what ladies he was talking about.  Patient seemed confused about this.  I asked him if he meant nursing staff and he said no it was other patients.  I ask him if he had actually seen any other patients recently and he became even more flustered.  Not bizarre or obviously delusional but I think getting confused to the.  The isolation I am sure continues to bother him.  No threats or aggression.  Past Psychiatric History: Long history of chronic disability lengthily documented elsewhere  Risk to Self:   Risk to Others:   Prior Inpatient Therapy:   Prior Outpatient Therapy:    Past Medical History:  Past Medical History:  Diagnosis Date  . Anxiety   . Anxiety disorder due to known physiological condition    HOSPITALIZED 10/18  . Arthritis   . Autistic disorder, residual state 08/18/2020  . COPD (chronic obstructive pulmonary disease) ([place])   . Depression   . Developmental disorder   . Diabetes mellitus without complication  ([place])   . Dyslipidemia   . Esophageal reflux   . HOH (hard of hearing)    MILDLY  . Hypertension   . Obesity   . Overactive bladder   . Palpitations    ANXIETY  . Schizophrenia, schizoaffective ([place])   . Sleep apnea   . Tremors of nervous system    HANDS DUE TO MEDICATIONS  . Urinary incontinence     Past Surgical History:  Procedure Laterality Date  . CATARACT EXTRACTION W/PHACO Left 11/14/2017   Procedure: CATARACT EXTRACTION PHACO AND INTRAOCULAR LENS PLACEMENT (IOC);  Surgeon: Birder Robson, MD;  Location: [facility] ORS;  Service: Ophthalmology;  Laterality: Left;  [place] 00:42 AP% 14.6 CDE 6.12 Fluid pack lot # [id number]  . COLONOSCOPY  01/28/2005  . COLONOSCOPY WITH PROPOFOL N/A 05/09/2016   Procedure: COLONOSCOPY WITH PROPOFOL;  Surgeon: Manya Silvas, MD;  Location[facility] END[facility];  Service: Endoscopy;  Laterality: N/A;  . ESOPHAGOGASTRODUODENOSCOPY N/A 05/09/2016   Procedure: ESOPHAGOGASTRODUODENOSCOPY (EGD);  Surgeon: Manya Silvas, MD;  Location: [facility]OSCOPY;  Service: Endoscopy;  Laterality: N/A;  . FRACTURE SURGERY     ORIF SHOULDER  . TONSILLECTOMY     Family History:  Family History  Problem Relation Age of Onset  . Hypertension Mother   . Stroke Father   . Heart Problems Father    Family Psychiatric  History: None Social History:  Social History   Substance and Sexual Activity  Alcohol Use No  . Alcohol/week: 0.0 standard drinks     Social  History   Substance and Sexual Activity  Drug Use No    Social History   Socioeconomic History  . Marital status: Single    Spouse name: Not on file  . Number of children: Not on file  . Years of education: Not on file  . Highest education level: Not on file  Occupational History  . Not on file  Tobacco Use  . Smoking status: Never Smoker  . Smokeless tobacco: Never Used  Vaping Use  . Vaping Use: Never used  Substance and Sexual Activity  . Alcohol use: No    Alcohol/week: 0.0 standard drinks   . Drug use: No  . Sexual activity: Never  Other Topics Concern  . Not on file  Social History Narrative   The patient never finished high school but did get his GED. 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 He is currently in disability and [name] is his legal guardian. He has been living in group homes for many years.      No pending legal charges   Social Determinants of Health   Financial Resource Strain: Not on file  Food Insecurity: Not on file  Transportation Needs: Not on file  Physical Activity: Not on file  Stress: Not on file  Social Connections: Not on file   Additional Social History:    Allergies:   Allergies  Allergen Reactions  . Other Other (See Comments)    MYACINS  . Penicillins Nausea And Vomiting  . Cortizone-10 [Hydrocortisone] Rash    Labs:  Results for orders placed or performed during the hospital encounter of 08/04/20 (from the past 48 hour(s))  Glucose, capillary     Status: Abnormal   Collection Time: 09/02/20 10:05 PM  Result Value Ref Range   Glucose-Capillary 125 (H) 70 - 99 mg/dL    Comment: Glucose reference range applies only to samples taken after fasting for at least 8 hours.  CBC     Status: Abnormal   Collection Time: 09/03/20  5:05 AM  Result Value Ref Range   WBC 4.3 4.0 - 10.5 K/uL   RBC 4.17 (L) 4.22 - 5.81 MIL/uL   Hemoglobin 13.5 13.0 - 17.0 g/dL   HCT 40.3 39.0 - 52.0 %   MCV 96.6 80.0 - 100.0 fL   MCH 32.4 26.0 - 34.0 pg   MCHC 33.5 30.0 - 36.0 g/dL   RDW 14.5 11.5 - 15.5 %   Platelets 173 150 - 400 K/uL   nRBC 0.0 0.0 - 0.2 %    Comment: Performed at [facility], [address], [place],  [address]    Current Facility-Administered Medications  Medication Dose Route Frequency Provider Last Rate Last Admin  . acetaminophen (TYLENOL) tablet 650 mg  650 mg Oral Q6H PRN Darrick Meigs, Marge Duncans, MD      . albuterol (VENTOLIN HFA) 108 (90  Base) MCG/ACT inhaler 1-2 puff  1-2 puff Inhalation Q6H PRN Oswald Hillock, MD      . aspirin EC tablet 81 mg  81 mg Oral Daily Oswald Hillock, MD   81 mg at 09/03/20 0804  . clonazePAM (KLONOPIN) tablet 1 mg  1 mg Oral BID Oswald Hillock, MD   1 mg at 09/03/20 2043  . divalproex (DEPAKOTE ER) 24 hr tablet 1,000 mg  1,000 mg Oral QHS Oswald Hillock, MD   1,000 mg at 09/03/20 2043  .  docusate sodium (COLACE) capsule 200 mg  200 mg Oral BID Wyvonnia Dusky, MD   200 mg at 09/03/20 2043  . enoxaparin (LOVENOX) injection 40 mg  40 mg Subcutaneous Q24H Oswald Hillock, MD   40 mg at 09/03/20 0806  . guaiFENesin-dextromethorphan (ROBITUSSIN DM) 100-10 MG/5ML syrup 10 mL  10 mL Oral Q4H PRN Oswald Hillock, MD      . hydrALAZINE (APRESOLINE) tablet 50 mg  50 mg Oral Q6H PRN Wyvonnia Dusky, MD      . hydrOXYzine (ATARAX/VISTARIL) tablet 10 mg  10 mg Oral TID PRN Oswald Hillock, MD   10 mg at 09/03/20 4098  . lamoTRIgine (LAMICTAL) tablet 150 mg  150 mg Oral QHS Oswald Hillock, MD   150 mg at 09/03/20 2043  . linagliptin (TRADJENTA) tablet 5 mg  5 mg Oral Daily Oswald Hillock, MD   5 mg at 09/03/20 0805  . LORazepam (ATIVAN) tablet 2 mg  2 mg Oral Q6H PRN Oswald Hillock, MD   2 mg at 08/26/20 2120  . [START ON 09/04/2020] lurasidone (LATUDA) tablet 40 mg  40 mg Oral Q breakfast Shae Hinnenkamp T, MD      . ondansetron (ZOFRAN) tablet 4 mg  4 mg Oral Q6H PRN Oswald Hillock, MD       Or  . ondansetron (ZOFRAN) injection 4 mg  4 mg Intravenous Q6H PRN Oswald Hillock, MD      . oxybutynin (DITROPAN) tablet 10 mg  10 mg Oral BID Oswald Hillock, MD   10 mg at 09/03/20 2044  . pantoprazole (PROTONIX) EC tablet 40 mg  40 mg Oral Daily Oswald Hillock, MD   40 mg at 09/03/20 0806  . polyethylene glycol (MIRALAX / GLYCOLAX) packet 17 g  17 g Oral Daily Wyvonnia Dusky, MD   17 g at 09/03/20 1191  . QUEtiapine (SEROQUEL) tablet 100 mg  100 mg Oral TID Oswald Hillock, MD   100 mg at 09/03/20 2044  . QUEtiapine (SEROQUEL)  tablet 400 mg  400 mg Oral QHS Eriko Economos, [name], MD   400 mg at 09/03/20 2044  . senna-docusate (Senokot-S) tablet 1 tablet  1 tablet Oral QHS PRN Oswald Hillock, MD      . sertraline (ZOLOFT) tablet 50 mg  50 mg Oral BID Adyan Palau, [name], MD   50 mg at 09/03/20 2044  . simethicone (MYLICON) chewable tablet 80 mg  80 mg Oral TID with meals Oswald Hillock, MD   80 mg at 09/03/20 1713  . tuberculin injection 5 Units  5 Units Intradermal Once Enzo Bi, MD   5 Units at 09/02/20 1854    Musculoskeletal: Strength & Muscle Tone: within normal limits Gait & Station: normal Patient leans: N/A  Psychiatric Specialty Exam: Physical Exam Vitals and nursing note reviewed.  Constitutional:      Appearance: He is well-developed and well-nourished.  HENT:     Head: Normocephalic and atraumatic.  Eyes:     Conjunctiva/sclera: Conjunctivae normal.     Pupils: Pupils are equal, round, and reactive to light.  Cardiovascular:     Heart sounds: Normal heart sounds.  Pulmonary:     Effort: Pulmonary effort is normal.  Abdominal:     Palpations: Abdomen is soft.  Musculoskeletal:        General: Normal range of motion.     Cervical back: Normal range of motion.  Skin:    General:  Skin is warm and dry.  Neurological:     Mental Status: He is alert.  Psychiatric:        Attention and Perception: He is inattentive.        Mood and Affect: Mood is anxious.        Speech: Speech is delayed.        Cognition and Memory: Cognition is impaired.     Review of Systems  Constitutional: Negative.   HENT: Negative.   Eyes: Negative.   Respiratory: Negative.   Cardiovascular: Negative.   Gastrointestinal: Negative.   Musculoskeletal: Negative.   Skin: Negative.   Neurological: Negative.   Psychiatric/Behavioral: The patient is nervous/anxious.     Blood pressure (!) 151/89, pulse 79, temperature 98.6 F (37 C), temperature source Oral, resp. rate (!) 25, height 5\' 8"  (1.727 m), weight 104 kg, SpO2 100  %.Body mass index is 34.86 kg/m.  General Appearance: Disheveled  Eye Contact:  Fair  Speech:  Slow  Volume:  Decreased  Mood:  Anxious  Affect:  Constricted  Thought Process:  Disorganized  Orientation:  Full (Time, Place, and Person)  Thought Content:  Illogical  Suicidal Thoughts:  No  Homicidal Thoughts:  No  Memory:  Immediate;   Fair Recent;   Poor Remote;   Fair  Judgement:  Impaired  Insight:  Shallow  Psychomotor Activity:  Tremor  Concentration:  Concentration: Poor  Recall:  Poor  Fund of Knowledge:  Fair  Language:  Fair  Akathisia:  No  Handed:  Right  AIMS (if indicated):     Assets:  Desire for Improvement Social Support  ADL's:  Impaired  Cognition:  Impaired,  Mild  Sleep:        Treatment Plan Summary: Plan Jerrye Beavers still seems a little bit confused.  Not bizarre not exactly psychotic but not thinking very clearly.  I went back into his old notes because I remembered he used to be on more antipsychotic than what he is on now.  I confirmed that not so long ago he was taking [place] which I recalled actually had been pretty helpful.  I am going to restart Latuda 40 mg a day given with breakfast along with the rest of his medicines for now.  I will continue to follow-up.  Disposition: Patient does not meet criteria for psychiatric inpatient admission. Supportive therapy provided about ongoing stressors.  Alethia Berthold, MD 09/03/2020 9:00 PM

## 2020-09-03 NOTE — TOC Progression Note (Signed)
Transition of Care [facility]) - Progression Note    Patient Details  Name: Alexander Beard MRN: [id number] Date of Birth: February 24, 1956  [facility] CM/SW Contact  Shelbie Hutching, RN Phone Number: 09/03/2020, 8:34 AM  Clinical Narrative:    RNCM spoke with brother [name] has decided that the family will privately pay for [facility] in [place].  RNCM reached out to [facility] with [facility] and she confirms that they will accept patient, paperwork and orders need to be confirmed and finances with the family worked out with the facility.  TB skin test ordered yesterday and can be read on Friday.  RNCM will complete FL2 and get MD to sign orders.     Expected Discharge Plan: Assisted Living Barriers to Discharge: Financial Resources,Other (comment)  Expected Discharge Plan and Services Expected Discharge Plan: Assisted Living   Discharge Planning Services: CM Consult   Living arrangements for the past 2 months: Group Home                                       Social Determinants of Health (SDOH) Interventions    Readmission Risk Interventions Readmission Risk Prevention Plan 08/07/2020  Transportation Screening Complete  Medication Review Press photographer) Complete  PCP or Specialist appointment within 3-5 days of discharge Complete  HRI or [place] Complete  SW Recovery Care/Counseling Consult Complete  [place] Complete  Some recent data might be hidden

## 2020-09-04 DIAGNOSIS — F840 Autistic disorder: Secondary | ICD-10-CM | POA: Diagnosis not present

## 2020-09-04 NOTE — Progress Notes (Signed)
Occupational Therapy Treatment Patient Details Name: Alexander Beard MRN: [id number] DOB: 1955/10/21 Today's Date: 09/04/2020    History of present illness 65 y.o. male with medical history significant for schizoaffective disorder, bipolar type, autism spectrum disorder, COPD, T2DM, HTN, HLD who presents to the ED for evaluation of shortness of breath. Covid-19+ on 08/04/20.  Per brother, patient recently had a prolonged behavioral health hospital stay from Aug - Dec 2021 before discharged to group care home in [place] which is a new environment for him.  Brother states that he has noted significant "grogginess" over the last few weeks since medication schedule changes (4x/day to 2x/day).  This has coincided with several falls that have not resulted in any significant injuries.   OT comments  When therapist arrived in room this AM, Alexander Beard was highly agitated, in tears, stating that people were ruining his reputation and trying to take away an award he had won. Provided therapeutic listening and reassurance, demonstrated relaxation techniques and persuaded Alexander Beard to give them a try. Pt finally calmed down and was able to discuss and practice activities that he can undertake when he is feeling upset. Pt was able to engage in chair mobility/transfers with SUPV, and, when asked, was Alexander Beard able to identify whether or not he was using good posture that would facilitate engagement in his art activities. He was able to recognize that, with more upright posture, he was less likely to drop his drawing instruments and better able to reach his stack of papers. Recommend ongoing OT while pt is hospitalized. [name], Alexander Beard is likely a better candidate for a group home or behavioral health setting, rather than a SNF, given that his physical health is not far from baseline at present.   Follow Up Recommendations  Other (comment) (supportive group home?)    Equipment Recommendations       Recommendations  for Other Services      Precautions / Restrictions Precautions Precautions: Fall Restrictions Weight Bearing Restrictions: No       Mobility Bed Mobility               General bed mobility comments: Deferred (pt in recliner beginning/end of session)    Transfers   Equipment used: Rolling walker (2 wheeled) Transfers: Sit to/from Stand Sit to Stand: Min guard         General transfer comment: moved fluidly today    Balance Overall balance assessment: Needs assistance Sitting-balance support: No upper extremity supported;Feet supported Sitting balance-Leahy Scale: Good Sitting balance - Comments: improved sitting balance   Standing balance support: Bilateral upper extremity supported Standing balance-Leahy Scale: Fair Standing balance comment: requires VCs and close supervision for safety                           ADL either performed or assessed with clinical judgement   ADL Overall ADL's : Needs assistance/impaired Eating/Feeding: Supervision/ safety;Set up                                           Vision Patient Visual Report: No change from baseline     Perception     Praxis      Cognition Arousal/Alertness: Awake/alert Behavior During Therapy: Agitated;Anxious Overall Cognitive Status: History of cognitive impairments - at baseline  Orientation Level: Disoriented to;Time Current Attention Level: Focused   Following Commands: Follows one step commands inconsistently Safety/Judgement: Decreased awareness of deficits Awareness: Intellectual   General Comments: oriented to self, place        Exercises Other Exercises Other Exercises: Repositioning in chair; transfers; relaxation techniques; cognitive orientation; relaxation techniques; therapeutic listening   Shoulder Instructions       General Comments      Pertinent Vitals/ Pain       Pain Assessment: No/denies pain  Home  Living                                          Prior Functioning/Environment              Frequency  Min 2X/week        Progress Toward Goals  OT Goals(current goals can now be found in the care plan section)  Progress towards OT goals: Progressing toward goals  Acute Rehab OT Goals Patient Stated Goal: to be able to do things for myself OT Goal Formulation: With patient Time For Goal Achievement: 09/17/20 Potential to Achieve Goals: Good  Plan Frequency remains appropriate    Co-evaluation                 AM-PAC OT "6 Clicks" Daily Activity     Outcome Measure   Help from another person eating meals?: A Little Help from another person taking care of personal grooming?: A Little Help from another person toileting, which includes using toliet, bedpan, or urinal?: A Lot Help from another person bathing (including washing, rinsing, drying)?: A Little Help from another person to put on and taking off regular upper body clothing?: A Little Help from another person to put on and taking off regular lower body clothing?: A Lot 6 Click Score: 16    End of Session Equipment Utilized During Treatment: Rolling walker  OT Visit Diagnosis: Unsteadiness on feet (R26.81);History of falling (Z91.81);Muscle weakness (generalized) (M62.81);Other symptoms and signs involving cognitive function   Activity Tolerance Patient tolerated treatment well   Patient Left in chair;with call bell/phone within reach;with chair alarm set   Nurse Communication          Time: 8372-9021 OT Time Calculation (min): 14 min  Charges: OT General Charges $OT Visit: 1 Visit OT Treatments $Self Care/Home Management : 8-22 mins  Josiah Lobo, PhD, [place], OTR/L ascom [phone] 09/04/20, 1:51 PM

## 2020-09-04 NOTE — Progress Notes (Signed)
TB skin test on left forearm read. 49mm induration. Negative.

## 2020-09-04 NOTE — Evaluation (Signed)
Physical Therapy Re-Evaluation Patient Details Name: Alexander Beard MRN: [id number] DOB: 01-16-1956 Today's Date: 09/04/2020   History of Present Illness  65 y.o. male with medical history significant for schizoaffective disorder, bipolar type, autism spectrum disorder, COPD, T2DM, HTN, HLD who presents to the ED for evaluation of shortness of breath. Covid-19+ on 08/04/20.  Per brother, patient recently had a prolonged behavioral health hospital stay from Aug - Dec 2021 before discharged to group care home in [place] which is a new environment for him.  Brother states that he has noted significant "grogginess" over the last few weeks since medication schedule changes (4x/day to 2x/day).  This has coincided with several falls that have not resulted in any significant injuries.  Clinical Impression  Pt seen for PT re-evaluation with pt pleasant & agreeable to tx. Pt is making good progress with functional mobility as pt was able to ambulate 85 ft with RW & CGA<>min assist but with impaired gait pattern as noted below. Pt does well with verbal cuing to ambulate within base of AD but pt requires frequent cuing throughout session. Pt is able to complete 5x sit<>stands with instructional cuing for hand placement & as a little as CGA. Pt does demonstrate impaired cognition, as at end of session pt stands & moves from recliner without prompting, requiring cuing for safety & to return to sitting in recliner. Pt's goals remain appropriate at this time & dates have been updated. Pt would continue to benefit from skilled PT treatment to progress gait with LRAD, address safety with mobility, and balance. Pt would also benefit from STR upon d/c to maximize independence with functional mobility & reduce fall risk prior to return home.     Follow Up Recommendations SNF    Equipment Recommendations  Rolling walker with 5" wheels;3in1 (PT)    Recommendations for Other Services       Precautions / Restrictions  Precautions Precautions: Fall Restrictions Weight Bearing Restrictions: No      Mobility  Bed Mobility Overal bed mobility:  (not observed, pt recieved & left sitting in recliner)             General bed mobility comments: Deferred (pt in recliner beginning/end of session)    Transfers Overall transfer level: Needs assistance Equipment used: Rolling walker (2 wheeled) Transfers: Sit to/from Stand Sit to Stand: Min guard         General transfer comment: instructional cuing for safe hand placement  Ambulation/Gait Ambulation/Gait assistance: Min guard;Min assist Gait Distance (Feet): 85 Feet Assistive device: Rolling walker (2 wheeled) Gait Pattern/deviations: Step-to pattern;Decreased step length - left;Decreased step length - right;Decreased stride length;Decreased dorsiflexion - right;Decreased dorsiflexion - left Gait velocity: decreased   General Gait Details: Cuing to ambulate within base of AD vs pushing it too far out in front. Pt with difficulty sequencing turning, with less balance when turning, requiring min assist.  Stairs            Wheelchair Mobility    Modified Rankin (Stroke Patients Only)       Balance Overall balance assessment: Needs assistance Sitting-balance support: No upper extremity supported;Feet supported Sitting balance-Leahy Scale: Fair Sitting balance - Comments: supervision static sitting, LOB to R when sitting in recliner attempting to don sock on L foot   Standing balance support: Bilateral upper extremity supported Standing balance-Leahy Scale: Fair Standing balance comment: requires VCs and close supervision for safety  Pertinent Vitals/Pain Pain Assessment: No/denies pain    Home Living Family/patient expects to be discharged to:: Group home                 Additional Comments: Pt has been living at a group home since Dec 2021. Prior to that, hospitalized in  behavioral health unit from Aug-Dec 2021    Prior Function Level of Independence: Needs assistance   Gait / Transfers Assistance Needed: Per previous therapy evaluation "Per brother, pt was not using any DME for functional mobility prior to his Aug-Dec hospitalization, and is unsure how pt has been moving since last admission. Pt's brother reports that pt has LE neuropathy at baseline, which has contributed to his hx of falls. Reports that he has fallen much more frequently since living in the group home"  ADL's / Homemaking Assistance Needed: Per previous therapy evaluation "Pt's brother reports that he needed assistance with donning compression socks prior to his Aug-Dec hospitalization, and is unsure how much assistance he was receiving for dressing in group home. Per pt's brother, pt needed MIN-MOD A for bathing to ensure thoroughness"        Hand Dominance        Extremity/Trunk Assessment   Upper Extremity Assessment Upper Extremity Assessment: Generalized weakness (BUE tremors)    Lower Extremity Assessment Lower Extremity Assessment: Generalized weakness       Communication   Communication: No difficulties  Cognition Arousal/Alertness: Awake/alert Behavior During Therapy: Anxious Overall Cognitive Status: History of cognitive impairments - at baseline Area of Impairment: Orientation;Attention;Memory;Following commands;Safety/judgement;Awareness;Problem solving                   Memory: Decreased recall of precautions;Decreased short-term memory Following Commands: Follows one step commands inconsistently Safety/Judgement: Decreased awareness of deficits;Decreased awareness of safety Awareness: Intellectual Problem Solving: Slow processing;Requires tactile cues;Requires verbal cues       General Comments General comments (skin integrity, edema, etc.): Assisted pt with changing into clean gown & new socks.    Exercises Other Exercises Other Exercises: 5x  sit<>stand with cuing for hand placement with use of BUE & CGA overall   Assessment/Plan    PT Assessment Patient needs continued PT services  PT Problem List Decreased mobility;Decreased balance;Decreased activity tolerance;Decreased strength;Decreased coordination;Decreased cognition;Decreased knowledge of use of DME;Decreased safety awareness;Decreased knowledge of precautions       PT Treatment Interventions DME instruction;Gait training;Functional mobility training;Cognitive remediation;Therapeutic exercise;Balance training;Patient/family education;Stair training;Therapeutic activities;Neuromuscular re-education    PT Goals (Current goals can be found in the Care Plan section)  Acute Rehab PT Goals Patient Stated Goal: to be able to do things for myself PT Goal Formulation: With patient Time For Goal Achievement: 09/18/20 Potential to Achieve Goals: Fair    Frequency Min 2X/week   Barriers to discharge Decreased caregiver support      Co-evaluation               AM-PAC PT "6 Clicks" Mobility  Outcome Measure Help needed turning from your back to your side while in a flat bed without using bedrails?: None Help needed moving from lying on your back to sitting on the side of a flat bed without using bedrails?: A Little Help needed moving to and from a bed to a chair (including a wheelchair)?: A Little Help needed standing up from a chair using your arms (e.g., wheelchair or bedside chair)?: A Little Help needed to walk in hospital room?: A Little Help needed climbing 3-5 steps with a railing? :  A Lot 6 Click Score: 18    End of Session Equipment Utilized During Treatment: Gait belt Activity Tolerance: Patient tolerated treatment well Patient left: in chair;with call bell/phone within reach;with chair alarm set   PT Visit Diagnosis: Muscle weakness (generalized) (M62.81);Difficulty in walking, not elsewhere classified (R26.2);Unsteadiness on feet (R26.81)    Time:  9826-4158 PT Time Calculation (min) (ACUTE ONLY): 28 min   Charges:   PT Evaluation $PT Re-evaluation: 1 Re-eval PT Treatments $Therapeutic Activity: 8-22 mins        Lavone Nian, PT, DPT 09/04/20, 2:52 PM   Waunita Schooner 09/04/2020, 2:48 PM

## 2020-09-04 NOTE — Progress Notes (Addendum)
PROGRESS NOTE   HPI was taken from Dr. Zada Finders: Alexander Beard is a 65 y.o. male with medical history significant for schizoaffective disorder, bipolar type, autism spectrum disorder, COPD, T2DM, HTN, HLD who presents to the ED for evaluation of shortness of breath.  Unable to obtain history from patient due to excessive somnolence and is otherwise obtained from EDP, chart review, and brother by phone.  Per ED documentation, patient was brought to ED via EMS who on their arrival noted patient SPO2 was 92% on room air with coarse crackles in the lungs.  He was placed on 4 L O2 via [place] on route to the ED.  On arrival patient was reportedly alert and interactive however following triage and initial labs he had a change in mental status described as a mumbling speech pattern and minimally responsive.  Spoke with patient's brother by phone.  His brother says that patient recently had a prolonged behavioral health hospital stay from August to December before he was discharged to group care home in [place] which is a new environment for him.  Brother states that previously patient's medication schedule was 4 times daily but since he left the hospital is now taking meds twice daily.  Brother states that he has noted significant "grogginess" over the last few weeks.  This has coincided with several falls that have not resulted in any significant injuries.  ED Course:  Initial vitals showed BP 104/65, pulse 68, RR 18, temp 99.1 F, SPO2 96% on room air.  Labs showed sodium 140, potassium 4.0, bicarb 28, BUN 13, creatinine 0.68, serum glucose 105, WBC 4.0, hemoglobin 14.1, platelets 105,000, high-sensitivity troponin I 4x2, procalcitonin <0.10, lactic acid 2.3, valproic acid level 92, ammonia 28.  VBG showed pH 7.39, PCO2 62.  Urinalysis shows negative nitrates, negative leukocytes, 0-5 RBCs and WBCs/hpf, rare bacteria microscopy.  UDS positive for tricyclic's and benzodiazepines.  Blood cultures  obtained and pending.  SARS-CoV-2 PCR is positive.  2 view chest x-ray shows airspace opacity at the left lung base.  CT head without contrast is negative for acute intracranial abnormality.  Atrophy, chronic microvascular disease noted.  CT cervical spine without contrast shows diffuse degenerative disc and facet disease without acute bony abnormality.  Patient was given 1 L LR and the hospitalist service was consulted to admit for further evaluation and management.   Hospital Course from Dr. Jimmye Norman 2/9-2/18/22: Pt completed remdesivir and steroid course for COVID19 pneumonia. Pt also has been weaned off of oxygen. CM is having trouble with placement issues for the pt. PT/OT recs SNF. Will have psych evaluate.    [name]  TDV:[id number] DOB: [birth year] DOA: 08/04/2020 PCP: Alexander Haggard, FNP  Assessment & Plan:   Principal Problem:   Active autistic disorder Active Problems:   Schizoaffective disorder, bipolar type ([place])   Hypertension   Controlled type 2 diabetes mellitus without complication ([place])   Pneumonia due to COVID-19 virus   Encephalopathy acute   Pneumonia  Acute hypoxic respiratory failure: resolved.  Secondary to COVID19 pneumonia  Completed remdesivir & steroid courses.  Acute metabolic encephalopathy:  likely overmedicated, dose has been reduced. Mental status waxes and wanes   Schizoaffective disorder:  long term group resident. Continue on seroquel, lamictal, Depakote, zoloft & clonoazepam. --restart Latuda 40 mg a day given with breakfast, by psych   Hypokalemia:  --monitor and replete PRN  Thrombocytopenia: etiology unclear, labile.   DM2:  continue on tradjenta --fast BG check every morning  DVT prophylaxis: lovenox  Code Status: full  Family Communication:  Disposition Plan: PT/OT recs SNF, still waiting on SNF placement.    Level of care: Med-Surg   Status is: Inpatient  Remains inpatient appropriate because:Unsafe d/c  plan   Dispo:  Patient From: home   Planned Disposition: SNF   Expected discharge date: whenever SNF bed or other facility is available   Medically stable for discharge: yes      Consultants:      Procedures:   Antimicrobials:   Subjective: Today, pt was upset that somehow he made everyone upset.   I was able to re-direct him by helping him set up his lunch tray.  Pt seems to enjoy eating and is always in a better mood while eating.    Objective: Vitals:   09/03/20 2037 09/04/20 0044 09/04/20 0437 09/04/20 0813  BP: (!) 151/89 117/74 121/79 127/77  Pulse: 79 75 67 (!) 54  Resp: (!) 25 18 18 16   Temp: 98.6 F (37 C) 98.4 F (36.9 C) 98.3 F (36.8 C) 98.7 F (37.1 C)  TempSrc: Oral     SpO2: 100% 93% 94% 97%  Weight:      Height:        Intake/Output Summary (Last 24 hours) at 09/04/2020 1427 Last data filed at 09/04/2020 1026 Gross per 24 hour  Intake 240 ml  Output 480 ml  Net -240 ml   Filed Weights   08/18/20 2304  Weight: 104 kg    Examination:  Constitutional: NAD, alert, sitting in chair, drawing HEENT: conjunctivae and lids normal, EOMI CV: No cyanosis.   RESP: normal respiratory effort, on RA Extremities: No effusions, edema in BLE SKIN: warm, dry Neuro: II - XII grossly intact.   Psych: anxious mood and affect.     Data Reviewed: I have personally reviewed following labs and imaging studies  CBC: Recent Labs  Lab 08/30/20 0446 09/03/20 0505  WBC 4.7 4.3  HGB 12.5* 13.5  HCT 38.7* 40.3  MCV 97.5 96.6  PLT 132* 102   Basic Metabolic Panel: Recent Labs  Lab 08/30/20 0446  NA 142  K 4.4  CL 105  CO2 30  GLUCOSE 115*  BUN 17  CREATININE 0.76  CALCIUM 8.9  MG 2.1   GFR: Estimated Creatinine Clearance: 109 mL/min (by C-G formula based on SCr of 0.76 mg/dL). Liver Function Tests: No results for input(s): AST, ALT, ALKPHOS, BILITOT, PROT, ALBUMIN in the last 168 hours. No results for input(s): LIPASE, AMYLASE in the last 168  hours. No results for input(s): AMMONIA in the last 168 hours. Coagulation Profile: No results for input(s): INR, PROTIME in the last 168 hours. Cardiac Enzymes: No results for input(s): CKTOTAL, CKMB, CKMBINDEX, TROPONINI in the last 168 hours. BNP (last 3 results) No results for input(s): PROBNP in the last 8760 hours. HbA1C: No results for input(s): HGBA1C in the last 72 hours. CBG: Recent Labs  Lab 08/31/20 1230 08/31/20 1636 08/31/20 2117 09/02/20 2205 09/03/20 2203  GLUCAP 87 168* 314* 125* 146*   Lipid Profile: No results for input(s): CHOL, HDL, LDLCALC, TRIG, CHOLHDL, LDLDIRECT in the last 72 hours. Thyroid Function Tests: No results for input(s): TSH, T4TOTAL, FREET4, T3FREE, THYROIDAB in the last 72 hours. Anemia Panel: No results for input(s): VITAMINB12, FOLATE, FERRITIN, TIBC, IRON, RETICCTPCT in the last 72 hours. Sepsis Labs: No results for input(s): PROCALCITON, LATICACIDVEN in the last 168 hours.  No results found for this or any previous visit (from the past  240 hour(s)).       Radiology Studies: No results found.      Scheduled Meds: . aspirin EC  81 mg Oral Daily  . clonazePAM  1 mg Oral BID  . divalproex  1,000 mg Oral QHS  . docusate sodium  200 mg Oral BID  . enoxaparin (LOVENOX) injection  40 mg Subcutaneous Q24H  . lamoTRIgine  150 mg Oral QHS  . linagliptin  5 mg Oral Daily  . lurasidone  40 mg Oral Q breakfast  . oxybutynin  10 mg Oral BID  . pantoprazole  40 mg Oral Daily  . polyethylene glycol  17 g Oral Daily  . QUEtiapine  100 mg Oral TID  . QUEtiapine  400 mg Oral QHS  . sertraline  50 mg Oral BID  . simethicone  80 mg Oral TID with meals  . tuberculin  5 Units Intradermal Once   Continuous Infusions:   LOS: 30 days    Enzo Bi, MD [facility] Pager [phone]  If 7PM-7AM, please contact night-coverage 09/04/2020, 2:27 PM

## 2020-09-05 DIAGNOSIS — F840 Autistic disorder: Secondary | ICD-10-CM | POA: Diagnosis not present

## 2020-09-05 LAB — GLUCOSE, CAPILLARY: Glucose-Capillary: 260 mg/dL — ABNORMAL HIGH (ref 70–99)

## 2020-09-05 NOTE — Progress Notes (Signed)
PROGRESS NOTE   HPI was taken from Dr. Zada Finders: Alexander Beard is a 65 y.o. male with medical history significant for schizoaffective disorder, bipolar type, autism spectrum disorder, COPD, T2DM, HTN, HLD who presents to the ED for evaluation of shortness of breath.  Unable to obtain history from patient due to excessive somnolence and is otherwise obtained from EDP, chart review, and brother by phone.  Per ED documentation, patient was brought to ED via EMS who on their arrival noted patient SPO2 was 92% on room air with coarse crackles in the lungs.  He was placed on 4 L O2 via [place] on route to the ED.  On arrival patient was reportedly alert and interactive however following triage and initial labs he had a change in mental status described as a mumbling speech pattern and minimally responsive.  Spoke with patient's brother by phone.  His brother says that patient recently had a prolonged behavioral health hospital stay from August to December before he was discharged to group care home in [place] which is a new environment for him.  Brother states that previously patient's medication schedule was 4 times daily but since he left the hospital is now taking meds twice daily.  Brother states that he has noted significant "grogginess" over the last few weeks.  This has coincided with several falls that have not resulted in any significant injuries.  ED Course:  Initial vitals showed BP 104/65, pulse 68, RR 18, temp 99.1 F, SPO2 96% on room air.  Labs showed sodium 140, potassium 4.0, bicarb 28, BUN 13, creatinine 0.68, serum glucose 105, WBC 4.0, hemoglobin 14.1, platelets 105,000, high-sensitivity troponin I 4x2, procalcitonin <0.10, lactic acid 2.3, valproic acid level 92, ammonia 28.  VBG showed pH 7.39, PCO2 62.  Urinalysis shows negative nitrates, negative leukocytes, 0-5 RBCs and WBCs/hpf, rare bacteria microscopy.  UDS positive for tricyclic's and benzodiazepines.  Blood cultures  obtained and pending.  SARS-CoV-2 PCR is positive.  2 view chest x-ray shows airspace opacity at the left lung base.  CT head without contrast is negative for acute intracranial abnormality.  Atrophy, chronic microvascular disease noted.  CT cervical spine without contrast shows diffuse degenerative disc and facet disease without acute bony abnormality.  Patient was given 1 L LR and the hospitalist service was consulted to admit for further evaluation and management.   Hospital Course from Dr. Jimmye Norman 2/9-2/18/22: Pt completed remdesivir and steroid course for COVID19 pneumonia. Pt also has been weaned off of oxygen. CM is having trouble with placement issues for the pt. PT/OT recs SNF. Will have psych evaluate.    KYRO JOSWICK  WRU:[id number] DOB: 1956-01-01 DOA: 08/04/2020 PCP: Remi Haggard, FNP  Assessment & Plan:   Principal Problem:   Active autistic disorder Active Problems:   Schizoaffective disorder, bipolar type ([place])   Hypertension   Controlled type 2 diabetes mellitus without complication ([place])   Pneumonia due to COVID-19 virus   Encephalopathy acute   Pneumonia  Acute hypoxic respiratory failure: resolved.  Secondary to COVID19 pneumonia  Completed remdesivir & steroid courses.  Acute metabolic encephalopathy:  likely overmedicated, dose has been reduced. Mental status waxes and wanes   Schizoaffective disorder:  long term group resident. Continue on seroquel, lamictal, Depakote, zoloft & clonoazepam. --restart Latuda 40 mg a day given with breakfast, by psych   Hypokalemia:  --monitor and replete PRN  Thrombocytopenia: etiology unclear, labile.   DM2:  continue on tradjenta --fast BG check every morning  DVT prophylaxis: lovenox  Code Status: full  Family Communication:  Disposition Plan: [place]   Level of care: Med-Surg   Status is: Inpatient  Remains inpatient appropriate because:Unsafe d/c plan   Dispo:  Patient  From: home   Planned Disposition: [place]   Expected discharge date: whenever bed from facility is available  Medically stable for discharge: yes      Consultants:      Procedures:   Antimicrobials:   Subjective: Pt was sitting out in front of nurse's station today, in a good mood, watching his iPad and drawing.  No complaint.   Objective: Vitals:   09/04/20 2007 09/05/20 0437 09/05/20 0742 09/05/20 1123  BP: 112/69 140/86 (!) 147/84 102/73  Pulse: 69 (!) 58 72 83  Resp: 16 18 16 16   Temp: 98.1 F (36.7 C) (!) 97.5 F (36.4 C) 98.2 F (36.8 C) 98 F (36.7 C)  TempSrc: Oral Oral    SpO2: 98% 97% 94% (!) 88%  Weight:      Height:        Intake/Output Summary (Last 24 hours) at 09/05/2020 1541 Last data filed at 09/05/2020 1438 Gross per 24 hour  Intake 240 ml  Output --  Net 240 ml   Filed Weights   08/18/20 2304  Weight: 104 kg    Examination:  Constitutional: NAD, alert, oriented to self and place, sitting out in front of nurse's station HEENT: conjunctivae and lids normal, EOMI CV: No cyanosis.   RESP: normal respiratory effort, on RA Neuro: II - XII grossly intact.   Psych: better mood and affect today    Data Reviewed: I have personally reviewed following labs and imaging studies  CBC: Recent Labs  Lab 08/30/20 0446 09/03/20 0505  WBC 4.7 4.3  HGB 12.5* 13.5  HCT 38.7* 40.3  MCV 97.5 96.6  PLT 132* 998   Basic Metabolic Panel: Recent Labs  Lab 08/30/20 0446  NA 142  K 4.4  CL 105  CO2 30  GLUCOSE 115*  BUN 17  CREATININE 0.76  CALCIUM 8.9  MG 2.1   GFR: Estimated Creatinine Clearance: 109 mL/min (by C-G formula based on SCr of 0.76 mg/dL). Liver Function Tests: No results for input(s): AST, ALT, ALKPHOS, BILITOT, PROT, ALBUMIN in the last 168 hours. No results for input(s): LIPASE, AMYLASE in the last 168 hours. No results for input(s): AMMONIA in the last 168 hours. Coagulation Profile: No results for input(s):  INR, PROTIME in the last 168 hours. Cardiac Enzymes: No results for input(s): CKTOTAL, CKMB, CKMBINDEX, TROPONINI in the last 168 hours. BNP (last 3 results) No results for input(s): PROBNP in the last 8760 hours. HbA1C: No results for input(s): HGBA1C in the last 72 hours. CBG: Recent Labs  Lab 08/31/20 1230 08/31/20 1636 08/31/20 2117 09/02/20 2205 09/03/20 2203  GLUCAP 87 168* 314* 125* 146*   Lipid Profile: No results for input(s): CHOL, HDL, LDLCALC, TRIG, CHOLHDL, LDLDIRECT in the last 72 hours. Thyroid Function Tests: No results for input(s): TSH, T4TOTAL, FREET4, T3FREE, THYROIDAB in the last 72 hours. Anemia Panel: No results for input(s): VITAMINB12, FOLATE, FERRITIN, TIBC, IRON, RETICCTPCT in the last 72 hours. Sepsis Labs: No results for input(s): PROCALCITON, LATICACIDVEN in the last 168 hours.  No results found for this or any previous visit (from the past 240 hour(s)).       Radiology Studies: No results found.      Scheduled Meds: . aspirin EC  81 mg Oral Daily  .  clonazePAM  1 mg Oral BID  . divalproex  1,000 mg Oral QHS  . docusate sodium  200 mg Oral BID  . enoxaparin (LOVENOX) injection  40 mg Subcutaneous Q24H  . lamoTRIgine  150 mg Oral QHS  . linagliptin  5 mg Oral Daily  . lurasidone  40 mg Oral Q breakfast  . oxybutynin  10 mg Oral BID  . pantoprazole  40 mg Oral Daily  . polyethylene glycol  17 g Oral Daily  . QUEtiapine  100 mg Oral TID  . QUEtiapine  400 mg Oral QHS  . sertraline  50 mg Oral BID  . simethicone  80 mg Oral TID with meals   Continuous Infusions:   LOS: 31 days    Enzo Bi, MD [facility] Pager [phone]  If 7PM-7AM, please contact night-coverage 09/05/2020, 3:41 PM

## 2020-09-06 DIAGNOSIS — F840 Autistic disorder: Secondary | ICD-10-CM | POA: Diagnosis not present

## 2020-09-06 MED ORDER — METFORMIN HCL 850 MG PO TABS
850.0000 mg | ORAL_TABLET | Freq: Two times a day (BID) | ORAL | Status: DC
  Administered 2020-09-06 – 2020-09-09 (×6): 850 mg via ORAL
  Filled 2020-09-06 (×7): qty 1

## 2020-09-06 NOTE — Progress Notes (Signed)
   09/06/20 1000  Clinical Encounter Type  Visited With Patient  Visit Type Initial  Referral From Nurse  Consult/Referral To Chaplain  Spiritual Encounters  Spiritual Needs Emotional  Chaplain Milanna Kozlov responded to a OR room 110-A, Pt Alexander Beard. Pt was extremely disturbed because he believes in dinosaurs and he feels that others around him do not believe in them. I finally got Pt to calm down and I shared with him that everyone have different beliefs and we shouldn't get upset if others believe differently. Pt also stated, "he wanted the nurses to have faith in him that he can get up and walk by himself." I shared with the Pt that it is a process eventually they will believe if he is showing improvement and making progress.

## 2020-09-06 NOTE — Progress Notes (Signed)
PROGRESS NOTE   HPI was taken from Dr. Zada Finders: Alexander Beard is a 65 y.o. male with medical history significant for schizoaffective disorder, bipolar type, autism spectrum disorder, COPD, T2DM, HTN, HLD who presents to the ED for evaluation of shortness of breath.  Unable to obtain history from patient due to excessive somnolence and is otherwise obtained from EDP, chart review, and brother by phone.  Per ED documentation, patient was brought to ED via EMS who on their arrival noted patient SPO2 was 92% on room air with coarse crackles in the lungs.  He was placed on 4 L O2 via [place] on route to the ED.  On arrival patient was reportedly alert and interactive however following triage and initial labs he had a change in mental status described as a mumbling speech pattern and minimally responsive.  Spoke with patient's brother by phone.  His brother says that patient recently had a prolonged behavioral health hospital stay from August to December before he was discharged to group care home in [address] which is a new environment for him.  Brother states that previously patient's medication schedule was 4 times daily but since he left the hospital is now taking meds twice daily.  Brother states that he has noted significant "grogginess" over the last few weeks.  This has coincided with several falls that have not resulted in any significant injuries.  ED Course:  Initial vitals showed BP 104/65, pulse 68, RR 18, temp 99.1 F, SPO2 96% on room air.  Labs showed sodium 140, potassium 4.0, bicarb 28, BUN 13, creatinine 0.68, serum glucose 105, WBC 4.0, hemoglobin 14.1, platelets 105,000, high-sensitivity troponin I 4x2, procalcitonin <0.10, lactic acid 2.3, valproic acid level 92, ammonia 28.  VBG showed pH 7.39, PCO2 62.  Urinalysis shows negative nitrates, negative leukocytes, 0-5 RBCs and WBCs/hpf, rare bacteria microscopy.  UDS positive for tricyclic's and benzodiazepines.  Blood cultures  obtained and pending.  SARS-CoV-2 PCR is positive.  2 view chest x-ray shows airspace opacity at the left lung base.  CT head without contrast is negative for acute intracranial abnormality.  Atrophy, chronic microvascular disease noted.  CT cervical spine without contrast shows diffuse degenerative disc and facet disease without acute bony abnormality.  Patient was given 1 L LR and the hospitalist service was consulted to admit for further evaluation and management.   Hospital Course from Dr. Jimmye Norman 2/9-2/18/22: Pt completed remdesivir and steroid course for COVID19 pneumonia. Pt also has been weaned off of oxygen. CM is having trouble with placement issues for the pt. PT/OT recs SNF. Will have psych evaluate.    Alexander Beard  [id number]:[id number] DOB: 1955/08/19 DOA: 08/04/2020 PCP: Remi Haggard, FNP  Assessment & Plan:   Principal Problem:   Active autistic disorder Active Problems:   Schizoaffective disorder, bipolar type ([place])   Hypertension   Controlled type 2 diabetes mellitus without complication ([place])   Pneumonia due to COVID-19 virus   Encephalopathy acute   Pneumonia  Acute hypoxic respiratory failure: resolved.  Secondary to COVID19 pneumonia  Completed remdesivir & steroid courses.  Acute metabolic encephalopathy:  likely overmedicated, dose has been reduced. Mental status waxes and wanes   Schizoaffective disorder:  long term group resident. Continue on seroquel, lamictal, Depakote, zoloft & clonoazepam. --cont Latuda 40 mg a day given with breakfast, by psych   Hypokalemia:  --replete PRN  Thrombocytopenia: etiology unclear, labile.   DM2:  continue on tradjenta --fast BG check every morning   DVT prophylaxis:  lovenox  Code Status: full  Family Communication:  Disposition Plan: [place]   Level of care: Med-Surg   Status is: Inpatient  Remains inpatient appropriate because:Unsafe d/c plan   Dispo:  Patient From: home    Planned Disposition: [place]   Expected discharge date: whenever bed from facility is available  Medically stable for discharge: yes      Consultants:      Procedures:   Antimicrobials:   Subjective: Pt was upset again with another thought of people being upset with him.     Objective: Vitals:   09/06/20 0534 09/06/20 0909 09/06/20 1141 09/06/20 1640  BP: 126/77 114/88 109/68 119/78  Pulse: 62 80 69 (!) 57  Resp: 16 20 18 18   Temp: 97.8 F (36.6 C) 98.1 F (36.7 C) 97.9 F (36.6 C) 98.1 F (36.7 C)  TempSrc:      SpO2: 97% 100% 99% 98%  Weight:      Height:        Intake/Output Summary (Last 24 hours) at 09/06/2020 1804 Last data filed at 09/05/2020 1918 Gross per 24 hour  Intake 120 ml  Output --  Net 120 ml   Filed Weights   08/18/20 2304  Weight: 104 kg    Examination:  Constitutional: NAD, alert, oriented to person and place HEENT: conjunctivae and lids normal, EOMI CV: No cyanosis.   RESP: normal respiratory effort, on RA Neuro: II - XII grossly intact.   Psych: fixated and a particular idea, usually about people being upset with him, paranoid about people talking bad about him.  Gets agitated and very worked up.    Data Reviewed: I have personally reviewed following labs and imaging studies  CBC: Recent Labs  Lab 09/03/20 0505  WBC 4.3  HGB 13.5  HCT 40.3  MCV 96.6  PLT 174   Basic Metabolic Panel: No results for input(s): NA, K, CL, CO2, GLUCOSE, BUN, CREATININE, CALCIUM, MG, PHOS in the last 168 hours. GFR: Estimated Creatinine Clearance: 109 mL/min (by C-G formula based on SCr of 0.76 mg/dL). Liver Function Tests: No results for input(s): AST, ALT, ALKPHOS, BILITOT, PROT, ALBUMIN in the last 168 hours. No results for input(s): LIPASE, AMYLASE in the last 168 hours. No results for input(s): AMMONIA in the last 168 hours. Coagulation Profile: No results for input(s): INR, PROTIME in the last 168 hours. Cardiac  Enzymes: No results for input(s): CKTOTAL, CKMB, CKMBINDEX, TROPONINI in the last 168 hours. BNP (last 3 results) No results for input(s): PROBNP in the last 8760 hours. HbA1C: No results for input(s): HGBA1C in the last 72 hours. CBG: Recent Labs  Lab 08/31/20 1636 08/31/20 2117 09/02/20 2205 09/03/20 2203 09/05/20 2048  GLUCAP 168* 314* 125* 146* 260*   Lipid Profile: No results for input(s): CHOL, HDL, LDLCALC, TRIG, CHOLHDL, LDLDIRECT in the last 72 hours. Thyroid Function Tests: No results for input(s): TSH, T4TOTAL, FREET4, T3FREE, THYROIDAB in the last 72 hours. Anemia Panel: No results for input(s): VITAMINB12, FOLATE, FERRITIN, TIBC, IRON, RETICCTPCT in the last 72 hours. Sepsis Labs: No results for input(s): PROCALCITON, LATICACIDVEN in the last 168 hours.  No results found for this or any previous visit (from the past 240 hour(s)).       Radiology Studies: No results found.      Scheduled Meds: . aspirin EC  81 mg Oral Daily  . clonazePAM  1 mg Oral BID  . divalproex  1,000 mg Oral QHS  . docusate sodium  200 mg  Oral BID  . enoxaparin (LOVENOX) injection  40 mg Subcutaneous Q24H  . lamoTRIgine  150 mg Oral QHS  . linagliptin  5 mg Oral Daily  . lurasidone  40 mg Oral Q breakfast  . metFORMIN  850 mg Oral BID WC  . oxybutynin  10 mg Oral BID  . pantoprazole  40 mg Oral Daily  . polyethylene glycol  17 g Oral Daily  . QUEtiapine  100 mg Oral TID  . QUEtiapine  400 mg Oral QHS  . sertraline  50 mg Oral BID  . simethicone  80 mg Oral TID with meals   Continuous Infusions:   LOS: 32 days    Enzo Bi, MD [facility] Pager [phone]  If 7PM-7AM, please contact night-coverage 09/06/2020, 6:04 PM

## 2020-09-07 DIAGNOSIS — F840 Autistic disorder: Secondary | ICD-10-CM | POA: Diagnosis not present

## 2020-09-07 DIAGNOSIS — G9340 Encephalopathy, unspecified: Secondary | ICD-10-CM | POA: Diagnosis not present

## 2020-09-07 DIAGNOSIS — E119 Type 2 diabetes mellitus without complications: Secondary | ICD-10-CM | POA: Diagnosis not present

## 2020-09-07 LAB — SARS CORONAVIRUS 2 (TAT 6-24 HRS): SARS Coronavirus 2: NEGATIVE

## 2020-09-07 LAB — GLUCOSE, CAPILLARY: Glucose-Capillary: 110 mg/dL — ABNORMAL HIGH (ref 70–99)

## 2020-09-07 NOTE — Progress Notes (Addendum)
PROGRESS NOTE    Alexander Beard  [id number] DOB: June 13, 1956 DOA: 08/04/2020 PCP: Remi Haggard, FNP   Assessment & Plan:   Principal Problem:   Active autistic disorder Active Problems:   Schizoaffective disorder, bipolar type ([place])   Hypertension   Controlled type 2 diabetes mellitus without complication ([place])   Pneumonia due to COVID-19 virus   Encephalopathy acute   Pneumonia    Acute hypoxic respiratory failure: resolved.  Secondary to COVID19 pneumonia  Completed remdesivir & steroid courses.  Acute metabolic encephalopathy: labile. Likely overmedicated, dose has been reduced   Schizoaffective disorder: continue on latuda, seroquel, lamictal, depakote, zoloft, & clonazepam. Long term group resident. Likely has autism as per psych   Hypokalemia: will monitor periodically   Thrombocytopenia: resolved   DM2: likely well controlled. Continue on tradjenta   DVT prophylaxis: lovenox  Code Status: full  Family Communication:  Disposition Plan: awaiting for safe d/c plan   Level of care: Med-Surg    Status is: Inpatient  Remains inpatient appropriate because:Unsafe d/c plan   Dispo:  Patient From:   home   Planned Disposition:   ALF  Medically stable for discharge:   yes          Consultants:  Psych    Procedures:    Antimicrobials:    Subjective: Pt c/o fatigue  Objective: Vitals:   09/06/20 1640 09/06/20 1925 09/07/20 0048 09/07/20 0539  BP: 119/78 129/86 102/72 (!) 147/135  Pulse: (!) 57 81 87 77  Resp: 18 18 18 18   Temp: 98.1 F (36.7 C) 98.6 F (37 C) 98.3 F (36.8 C) 97.6 F (36.4 C)  TempSrc:    Oral  SpO2: 98% 100% 100% 97%  Weight:      Height:       No intake or output data in the 24 hours ending 09/07/20 0741 Filed Weights   08/18/20 2304  Weight: 104 kg    Examination:  General exam: Appears calm and comfortable  Respiratory system: Clear to auscultation. Respiratory effort normal. Cardiovascular  system: S1 & S2 +. No rubs, gallops or clicks.  Gastrointestinal system: Abdomen is nondistended, soft and nontender.  Normal bowel sounds heard. Central nervous system: Alert and awake. Moves all 4 extremities  Psychiatry: Judgement and insight appear abnormal. Flat mood and affect     Data Reviewed: I have personally reviewed following labs and imaging studies  CBC: Recent Labs  Lab 09/03/20 0505  WBC 4.3  HGB 13.5  HCT 40.3  MCV 96.6  PLT 177   Basic Metabolic Panel: No results for input(s): NA, K, CL, CO2, GLUCOSE, BUN, CREATININE, CALCIUM, MG, PHOS in the last 168 hours. GFR: Estimated Creatinine Clearance: 109 mL/min (by C-G formula based on SCr of 0.76 mg/dL). Liver Function Tests: No results for input(s): AST, ALT, ALKPHOS, BILITOT, PROT, ALBUMIN in the last 168 hours. No results for input(s): LIPASE, AMYLASE in the last 168 hours. No results for input(s): AMMONIA in the last 168 hours. Coagulation Profile: No results for input(s): INR, PROTIME in the last 168 hours. Cardiac Enzymes: No results for input(s): CKTOTAL, CKMB, CKMBINDEX, TROPONINI in the last 168 hours. BNP (last 3 results) No results for input(s): PROBNP in the last 8760 hours. HbA1C: No results for input(s): HGBA1C in the last 72 hours. CBG: Recent Labs  Lab 08/31/20 2117 09/02/20 2205 09/03/20 2203 09/05/20 2048 09/07/20 0606  GLUCAP 314* 125* 146* 260* 110*   Lipid Profile: No results for input(s): CHOL, HDL, LDLCALC, TRIG,  CHOLHDL, LDLDIRECT in the last 72 hours. Thyroid Function Tests: No results for input(s): TSH, T4TOTAL, FREET4, T3FREE, THYROIDAB in the last 72 hours. Anemia Panel: No results for input(s): VITAMINB12, FOLATE, FERRITIN, TIBC, IRON, RETICCTPCT in the last 72 hours. Sepsis Labs: No results for input(s): PROCALCITON, LATICACIDVEN in the last 168 hours.  No results found for this or any previous visit (from the past 240 hour(s)).       Radiology Studies: No results  found.      Scheduled Meds:  aspirin EC  81 mg Oral Daily   clonazePAM  1 mg Oral BID   divalproex  1,000 mg Oral QHS   docusate sodium  200 mg Oral BID   enoxaparin (LOVENOX) injection  40 mg Subcutaneous Q24H   lamoTRIgine  150 mg Oral QHS   linagliptin  5 mg Oral Daily   lurasidone  40 mg Oral Q breakfast   metFORMIN  850 mg Oral BID WC   oxybutynin  10 mg Oral BID   pantoprazole  40 mg Oral Daily   polyethylene glycol  17 g Oral Daily   QUEtiapine  100 mg Oral TID   QUEtiapine  400 mg Oral QHS   sertraline  50 mg Oral BID   simethicone  80 mg Oral TID with meals   Continuous Infusions:   LOS: 33 days    Time spent: 28 mins    Wyvonnia Dusky, MD [facility] Pager [phone]  If 7PM-7AM, please contact night-coverage 09/07/2020, 7:41 AM

## 2020-09-07 NOTE — Plan of Care (Signed)

## 2020-09-08 DIAGNOSIS — E119 Type 2 diabetes mellitus without complications: Secondary | ICD-10-CM | POA: Diagnosis not present

## 2020-09-08 DIAGNOSIS — F840 Autistic disorder: Secondary | ICD-10-CM | POA: Diagnosis not present

## 2020-09-08 DIAGNOSIS — G9340 Encephalopathy, unspecified: Secondary | ICD-10-CM | POA: Diagnosis not present

## 2020-09-08 LAB — GLUCOSE, CAPILLARY: Glucose-Capillary: 135 mg/dL — ABNORMAL HIGH (ref 70–99)

## 2020-09-08 NOTE — TOC Progression Note (Signed)
Transition of Care [facility]) - Progression Note    Patient Details  Name: BRIGHTON PILLEY MRN: [id number] Date of Birth: 01-17-1956  [facility] CM/SW Contact  Shelbie Hutching, RN Phone Number: 09/08/2020, 2:11 PM  Clinical Narrative:    Patient will discharge to assisted living at [facility] tomorrow.  [facility] will transport patient and arrive at 1PM here at the hospital.  Facility requested that patient arrive around 2pm.  Patient's brother [name] will be signing paperwork at the facility at 12pm tomorrow.     Expected Discharge Plan: Assisted Living Barriers to Discharge: Financial Resources,Other (comment)  Expected Discharge Plan and Services Expected Discharge Plan: Assisted Living   Discharge Planning Services: CM Consult   Living arrangements for the past 2 months: Group Home                                       Social Determinants of Health (SDOH) Interventions    Readmission Risk Interventions Readmission Risk Prevention Plan 08/07/2020  Transportation Screening Complete  Medication Review Press photographer) Complete  PCP or Specialist appointment within 3-5 days of discharge Complete  HRI or [place] Complete  SW Recovery Care/Counseling Consult Complete  [place] Complete  Some recent data might be hidden

## 2020-09-08 NOTE — Care Management Important Message (Signed)
Important Message  Patient Details  Name: Alexander Beard MRN: [id number] Date of Birth: [birth year]   Medicare Important Message Given:  Yes  Talked with patients HCPOA, Brother [name] and reviewed the Important Message from Medicare.  He was in agreement with the discharge plan and understood the use of this form. I thanked him for his time.  Juliann Pulse A Eustolia Drennen 09/08/2020, 8:48 AM

## 2020-09-08 NOTE — Progress Notes (Signed)
PROGRESS NOTE    Alexander Beard  QPR:[id number] DOB: [birth year] DOA: 08/04/2020 PCP: Remi Haggard, FNP   Assessment & Plan:   Principal Problem:   Active autistic disorder Active Problems:   Schizoaffective disorder, bipolar type ([place])   Hypertension   Controlled type 2 diabetes mellitus without complication ([place])   Pneumonia due to COVID-19 virus   Encephalopathy acute   Pneumonia    Acute hypoxic respiratory failure: resolved.  Secondary to COVID19 pneumonia  Completed remdesivir & steroid courses.  Acute metabolic encephalopathy: labile. Likely overmedicated, dose has been reduced   Schizoaffective disorder: continue on latuda, depakote, zoloft, seroquel & clonazepam. Long term group resident. Likely has autism as per psych  Hypokalemia: will monitor intermittently   Thrombocytopenia: resolved   DM2: continue on tradjenta. Likely well controlled     DVT prophylaxis: lovenox  Code Status: full  Family Communication:  Disposition Plan: awaiting for safe d/c plan   Level of care: Med-Surg    Status is: Inpatient  Remains inpatient appropriate because:Unsafe d/c plan, will d/c tomorrow    Dispo:  Patient From:   home   Planned Disposition:   ALF  Medically stable for discharge:   yes          Consultants:  Psych    Procedures:    Antimicrobials:    Subjective: Pt c/o malaise   Objective: Vitals:   09/07/20 1625 09/07/20 2000 09/07/20 2359 09/08/20 0448  BP: 112/69 122/77 115/68 120/81  Pulse: 62 93 99 77  Resp: 17 17 16 16   Temp: 97.9 F (36.6 C) 98.2 F (36.8 C) (!) 97.4 F (36.3 C) (!) 97.5 F (36.4 C)  TempSrc:  Oral Oral Oral  SpO2: 98% 100% 97% 97%  Weight:      Height:        Intake/Output Summary (Last 24 hours) at 09/08/2020 9935 Last data filed at 09/07/2020 1900 Gross per 24 hour  Intake 240 ml  Output --  Net 240 ml   Filed Weights   08/18/20 2304  Weight: 104 kg    Examination:  General exam:  Appears calm & comfortable  Respiratory system: clear breath sounds b/l  Cardiovascular system: S1/S2+. No rubs or gallops Gastrointestinal system: Abdomen is nondistended, soft and nontender.  Normal bowel sounds heard. Central nervous system: Alert and awake. Moves all 4 extremities  Psychiatry: Judgement and insight appears abnormal. Flat mood and affect    Data Reviewed: I have personally reviewed following labs and imaging studies  CBC: Recent Labs  Lab 09/03/20 0505  WBC 4.3  HGB 13.5  HCT 40.3  MCV 96.6  PLT 701   Basic Metabolic Panel: No results for input(s): NA, K, CL, CO2, GLUCOSE, BUN, CREATININE, CALCIUM, MG, PHOS in the last 168 hours. GFR: Estimated Creatinine Clearance: 109 mL/min (by C-G formula based on SCr of 0.76 mg/dL). Liver Function Tests: No results for input(s): AST, ALT, ALKPHOS, BILITOT, PROT, ALBUMIN in the last 168 hours. No results for input(s): LIPASE, AMYLASE in the last 168 hours. No results for input(s): AMMONIA in the last 168 hours. Coagulation Profile: No results for input(s): INR, PROTIME in the last 168 hours. Cardiac Enzymes: No results for input(s): CKTOTAL, CKMB, CKMBINDEX, TROPONINI in the last 168 hours. BNP (last 3 results) No results for input(s): PROBNP in the last 8760 hours. HbA1C: No results for input(s): HGBA1C in the last 72 hours. CBG: Recent Labs  Lab 09/02/20 2205 09/03/20 2203 09/05/20 2048 09/07/20 7793 09/08/20 9030  GLUCAP 125* 146* 260* 110* 135*   Lipid Profile: No results for input(s): CHOL, HDL, LDLCALC, TRIG, CHOLHDL, LDLDIRECT in the last 72 hours. Thyroid Function Tests: No results for input(s): TSH, T4TOTAL, FREET4, T3FREE, THYROIDAB in the last 72 hours. Anemia Panel: No results for input(s): VITAMINB12, FOLATE, FERRITIN, TIBC, IRON, RETICCTPCT in the last 72 hours. Sepsis Labs: No results for input(s): PROCALCITON, LATICACIDVEN in the last 168 hours.  Recent Results (from the past 240 hour(s))   SARS CORONAVIRUS 2 (TAT 6-24 HRS) Nasopharyngeal Nasopharyngeal Swab     Status: None   Collection Time: 09/07/20 10:15 AM   Specimen: Nasopharyngeal Swab  Result Value Ref Range Status   SARS Coronavirus 2 NEGATIVE NEGATIVE Final    Comment: (NOTE) SARS-CoV-2 target nucleic acids are NOT DETECTED.  The SARS-CoV-2 RNA is generally detectable in upper and lower respiratory specimens during the acute phase of infection. Negative results do not preclude SARS-CoV-2 infection, do not rule out co-infections with other pathogens, and should not be used as the sole basis for treatment or other patient management decisions. Negative results must be combined with clinical observations, patient history, and epidemiological information. The expected result is Negative.  Fact Sheet for Patients: SugarRoll.be  Fact Sheet for Healthcare Providers: https://www.woods-mathews.com/  This test is not yet approved or cleared by the [place] FDA and  has been authorized for detection and/or diagnosis of SARS-CoV-2 by FDA under an Emergency Use Authorization (EUA). This EUA will remain  in effect (meaning this test can be used) for the duration of the COVID-19 declaration under Se ction 564(b)(1) of the Act, 21 U.S.C. section 360bbb-3(b)(1), unless the authorization is terminated or revoked sooner.  Performed at [facility], [place] [address] [place], [place] [address]          Radiology Studies: No results found.      Scheduled Meds: . aspirin EC  81 mg Oral Daily  . clonazePAM  1 mg Oral BID  . divalproex  1,000 mg Oral QHS  . enoxaparin (LOVENOX) injection  40 mg Subcutaneous Q24H  . lamoTRIgine  150 mg Oral QHS  . linagliptin  5 mg Oral Daily  . lurasidone  40 mg Oral Q breakfast  . metFORMIN  850 mg Oral BID WC  . oxybutynin  10 mg Oral BID  . pantoprazole  40 mg Oral Daily  . QUEtiapine  100 mg Oral TID  . QUEtiapine  400  mg Oral QHS  . sertraline  50 mg Oral BID  . simethicone  80 mg Oral TID with meals   Continuous Infusions:   LOS: 34 days    Time spent: 25 mins    Wyvonnia Dusky, MD [facility] Pager [phone]  If 7PM-7AM, please contact night-coverage 09/08/2020, 7:22 AM

## 2020-09-08 NOTE — Progress Notes (Signed)
Physical Therapy Treatment Patient Details Name: Alexander Beard MRN: [id number] DOB: 28-May-1956 Today's Date: 09/08/2020    History of Present Illness 65 y.o. male with medical history significant for schizoaffective disorder, bipolar type, autism spectrum disorder, COPD, T2DM, HTN, HLD who presents to the ED for evaluation of shortness of breath. Covid-19+ on 08/04/20.  Per brother, patient recently had a prolonged behavioral health hospital stay from Aug - Dec 2021 before discharged to group care home in [place] which is a new environment for him.  Brother states that he has noted significant "grogginess" over the last few weeks since medication schedule changes (4x/day to 2x/day).  This has coincided with several falls that have not resulted in any significant injuries.    PT Comments    Patient cooperative with PT treatment session. Patient is progressing with increased activity tolerance this session. Patient needs Min guard- Min A for ambulating 120 ft using rolling walker with cues for safety. Sp02 100% on room air and heart rate 89bpm immediately after walking. Patient participated with LE strengthening exercises in the chair with cues for technique for strengthening. Recommend to continue PT to maximize independence in preparation for next level of care. SNF is recommended at discharge.     Follow Up Recommendations  SNF     Equipment Recommendations  Rolling walker with 5" wheels;3in1 (PT)    Recommendations for Other Services       Precautions / Restrictions Precautions Precautions: Fall Restrictions Weight Bearing Restrictions: No    Mobility  Bed Mobility               General bed mobility comments: not addressed as patient sitting up on arrival to room and post session    Transfers Overall transfer level: Needs assistance Equipment used: Rolling walker (2 wheeled) Transfers: Sit to/from Stand Sit to Stand: Min guard         General transfer comment:  verbal cues for hand placement with sitting.  Ambulation/Gait Ambulation/Gait assistance: Min guard;Min assist Gait Distance (Feet): 120 Feet Assistive device: Rolling walker (2 wheeled) Gait Pattern/deviations: Decreased step length - left;Decreased step length - right;Decreased stride length;Decreased dorsiflexion - right;Decreased dorsiflexion - left;Step-through pattern Gait velocity: decreased   General Gait Details: verbal cues for keeping rolling walker closer to base of support and upright posture. patient needs occasional minimal assistance negotiating turns with rolling walker, otherwise Min guard provided for safety. Patient is distracted at times and needs cues for attention to task   Stairs             Wheelchair Mobility    Modified Rankin (Stroke Patients Only)       Balance                                            Cognition Arousal/Alertness: Awake/alert Behavior During Therapy: Anxious Overall Cognitive Status: History of cognitive impairments - at baseline Area of Impairment: Safety/judgement                               General Comments: patient able to follow single step commands with extra time      Exercises General Exercises - Lower Extremity Long Arc Quad: AROM;Strengthening;Both;10 reps;Seated Hip ABduction/ADduction: AROM;Strengthening;Both;10 reps;Seated Straight Leg Raises: AAROM;Strengthening;Both;10 reps;Seated Hip Flexion/Marching: AROM;Strengthening;Both;10 reps;Seated Toe Raises: AROM;Strengthening;10 reps;Seated;Both Heel Raises: AROM;Strengthening;Both;10 reps;Seated  Other Exercises Other Exercises: verbal cues for exercise technique for strengthening BLE    General Comments        Pertinent Vitals/Pain Pain Assessment: No/denies pain    Home Living                      Prior Function            PT Goals (current goals can now be found in the care plan section) Acute Rehab  PT Goals Patient Stated Goal: to be as independent as possible PT Goal Formulation: With patient Time For Goal Achievement: 09/18/20 Potential to Achieve Goals: Fair Progress towards PT goals: Progressing toward goals    Frequency    Min 2X/week      PT Plan Current plan remains appropriate    Co-evaluation              AM-PAC PT "6 Clicks" Mobility   Outcome Measure  Help needed turning from your back to your side while in a flat bed without using bedrails?: None Help needed moving from lying on your back to sitting on the side of a flat bed without using bedrails?: A Little Help needed moving to and from a bed to a chair (including a wheelchair)?: A Little Help needed standing up from a chair using your arms (e.g., wheelchair or bedside chair)?: A Little Help needed to walk in hospital room?: A Little Help needed climbing 3-5 steps with a railing? : A Lot 6 Click Score: 18    End of Session Equipment Utilized During Treatment: Gait belt Activity Tolerance: Patient tolerated treatment well Patient left: in chair;with call bell/phone within reach;with chair alarm set Nurse Communication: Mobility status PT Visit Diagnosis: Muscle weakness (generalized) (M62.81);Difficulty in walking, not elsewhere classified (R26.2);Unsteadiness on feet (R26.81)     Time: 1005-1030 PT Time Calculation (min) (ACUTE ONLY): 25 min  Charges:  $Gait Training: 8-22 mins $Therapeutic Exercise: 8-22 mins                     Alexander Beard, PT, MPT    Alexander Beard 09/08/2020, 12:30 PM

## 2020-09-08 NOTE — Progress Notes (Signed)
   09/08/20 1420  Clinical Encounter Type  Visited With Patient  Visit Type Follow-up  Referral From Nurse  Consult/Referral To Nurse  Responded to PG from nurse. Pt was very sad. I prayed for the Pt, and I will follow up later.

## 2020-09-09 DIAGNOSIS — G9340 Encephalopathy, unspecified: Secondary | ICD-10-CM | POA: Diagnosis not present

## 2020-09-09 DIAGNOSIS — F840 Autistic disorder: Secondary | ICD-10-CM | POA: Diagnosis not present

## 2020-09-09 DIAGNOSIS — E119 Type 2 diabetes mellitus without complications: Secondary | ICD-10-CM | POA: Diagnosis not present

## 2020-09-09 LAB — GLUCOSE, CAPILLARY: Glucose-Capillary: 118 mg/dL — ABNORMAL HIGH (ref 70–99)

## 2020-09-09 MED ORDER — LURASIDONE HCL 40 MG PO TABS: 40.0000 mg | ORAL_TABLET | Freq: Every day | ORAL | 0 refills | Status: AC

## 2020-09-09 MED ORDER — DIVALPROEX SODIUM ER 500 MG PO TB24: 1000.0000 mg | ORAL_TABLET | Freq: Every day | ORAL | 0 refills | Status: AC

## 2020-09-09 MED ORDER — CLONAZEPAM 1 MG PO TABS: 1.0000 mg | ORAL_TABLET | Freq: Two times a day (BID) | ORAL | 0 refills | Status: AC

## 2020-09-09 NOTE — TOC Transition Note (Signed)
Transition of Care [facility]) - CM/SW Discharge Note   Patient Details  Name: Alexander Beard MRN: [id number] Date of Birth: November 02, 1955  [facility] CM/SW Contact:  Shelbie Hutching, RN Phone Number: 09/09/2020, 10:24 AM   Clinical Narrative:    Patient will discharge to [facility]F today.  All discharge paperwork has been sent to the facility.  Transport is arranged for 1PM today with [facility].  Patient's brother [name] is aware of discharge today and is signing paperwork at facility at 1200 today.     Final next level of care: Assisted Living Barriers to Discharge: Barriers Resolved   Patient Goals and CMS Choice   CMS Medicare.gov Compare Post Acute Care list provided to:: Patient Represenative (must comment) Choice offered to / list presented to : Sibling  Discharge Placement              Patient chooses bed at: Other - please specify in the comment section below: [facility] in [place].) Patient to be transferred to facility by: [facility] Name of family member notified: [name] Patient and family notified of of transfer: 09/09/20  Discharge Plan and Services   Discharge Planning Services: CM Consult                                 Social Determinants of Health ([place]) Interventions     Readmission Risk Interventions Readmission Risk Prevention Plan 08/07/2020  Transportation Screening Complete  Medication Review ([place]) Complete  PCP or Specialist appointment within 3-5 days of discharge Complete  HRI or Home Care Consult Complete  SW Recovery Care/Counseling Consult Complete  [place] Complete  Some recent data might be hidden

## 2020-09-09 NOTE — Progress Notes (Signed)
MD order received in [facility] to discharge pt to ALF today; pt will be discharged at 1300 today by [facility] transport to [facility] in [place], [place] previously established by [facility]; AVS placed in pt's discharge envelope, signed Rxs also in envelope for the ALF; pt's discharge pending arrival of [facility] transport

## 2020-09-09 NOTE — Progress Notes (Signed)
Occupational Therapy Treatment Patient Details Name: Alexander Beard MRN: [id number] DOB: September 16, 1955 Today's Date: 09/09/2020    History of present illness 65 y.o. male with medical history significant for schizoaffective disorder, bipolar type, autism spectrum disorder, COPD, T2DM, HTN, HLD who presents to the ED for evaluation of shortness of breath. Covid-19+ on 08/04/20.  Per brother, patient recently had a prolonged behavioral health hospital stay from Aug - Dec 2021 before discharged to group care home in [place] which is a new environment for him.  Brother states that he has noted significant "grogginess" over the last few weeks since medication schedule changes (4x/day to 2x/day).  This has coincided with several falls that have not resulted in any significant injuries.   OT comments  Alexander Beard appeared less anxious than in previous sessions. He was aware that he would soon be transferring to another facility; this therapist was surprised that Alexander Beard seemed calm regarding this, as he has previously displayed quite a bit of anxiety concerning any upcoming changes. He engaged in grooming, transfers, dressing, and feeding tasks with SUPV/CGA, exhibiting one small LOB in standing, from which he was quickly able to recover. Alexander Beard has continued to show good improvement in his abilities to engage in functional mobility tasks. Recommending a group home or assisted living situation for Alexander Beard, rather than a health-care facility, given that his physical health concerns are generally resolved at this point.   Follow Up Recommendations  Other (comment) (group home/assisted living)    Equipment Recommendations  None recommended by OT    Recommendations for Other Services      Precautions / Restrictions Precautions Precautions: Fall Restrictions Weight Bearing Restrictions: No       Mobility Bed Mobility               General bed mobility comments: not addressed as patient  sitting up on arrival to room and post session    Transfers Overall transfer level: Needs assistance Equipment used: Rolling walker (2 wheeled) Transfers: Sit to/from Stand Sit to Stand: Min guard         General transfer comment: more fluid movement today than in previous sessions    Balance Overall balance assessment: Needs assistance Sitting-balance support: Feet supported Sitting balance-Leahy Scale: Good     Standing balance support: Bilateral upper extremity supported Standing balance-Leahy Scale: Good Standing balance comment: improved standing balance today                           ADL either performed or assessed with clinical judgement   ADL Overall ADL's : Needs assistance/impaired Eating/Feeding: Supervision/ safety;Set up Eating/Feeding Details (indicate cue type and reason): feeding challenging 2/2 tremors Grooming: Wash/dry hands;Set up;Supervision/safety           Upper Body Dressing : Standing;Min guard Upper Body Dressing Details (indicate cue type and reason): today correctly able to pair UE/sleeve hole w/o a VC                         Vision Patient Visual Report: No change from baseline     Perception     Praxis      Cognition Arousal/Alertness: Awake/alert Behavior During Therapy: Restless Overall Cognitive Status: History of cognitive impairments - at baseline                     Current Attention Level: Focused   Following Commands:  Follows one step commands consistently       General Comments: perseverating on getting outside        Exercises     Shoulder Instructions       General Comments Assisted pt with grooming, changing into clean gown, socks, feeding    Pertinent Vitals/ Pain       Pain Assessment: No/denies pain  Home Living                                          Prior Functioning/Environment              Frequency  Min 2X/week        Progress  [place] Goals  OT Goals(current goals can now be found in the care plan section)  Progress towards OT goals: Progressing toward goals  Acute Rehab OT Goals Patient Stated Goal: to be as independent as possible OT Goal Formulation: With patient Time For Goal Achievement: 09/17/20 Potential to Achieve Goals: Good  Plan Frequency remains appropriate    Co-evaluation                 AM-PAC OT "6 Clicks" Daily Activity     Outcome Measure   Help from another person eating meals?: A Little Help from another person taking care of personal grooming?: A Little Help from another person toileting, which includes using toliet, bedpan, or urinal?: A Lot Help from another person bathing (including washing, rinsing, drying)?: A Little Help from another person to put on and taking off regular upper body clothing?: A Little Help from another person to put on and taking off regular lower body clothing?: A Lot 6 Click Score: 16    End of Session Equipment Utilized During Treatment: Rolling walker  OT Visit Diagnosis: Unsteadiness on feet (R26.81);History of falling (Z91.81);Muscle weakness (generalized) (M62.81);Other symptoms and signs involving cognitive function   Activity Tolerance Patient tolerated treatment well   Patient Left in chair;with call bell/phone within reach;with chair alarm set   Nurse Communication          Time: [id number] OT Time Calculation (min): 15 min  Charges: OT General Charges $OT Visit: 1 Visit OT Treatments $Self Care/Home Management : 8-22 mins  Alexander Lobo, PhD, [place], OTR/L ascom [phone] 09/09/20, 2:06 PM

## 2020-09-09 NOTE — Progress Notes (Signed)
Reported to said RN that the pt was discharged via [facility] transport to [place] via stretcher

## 2020-09-09 NOTE — Discharge Summary (Signed)
Physician Discharge Summary  ASA BAUDOIN HYQ:[id number] DOB: 07/04/1956 DOA: 08/04/2020  PCP: Remi Haggard, FNP  Admit date: 08/04/2020 Discharge date: 09/09/2020  Admitted From: group home  Disposition: ALF  Recommendations for Outpatient Follow-up:  1. Follow up with PCP in 1-2 weeks 2. F/u w/ psych in 1-2 weeks   Home Health: yes  Equipment/Devices:   Discharge Condition: stable  CODE STATUS: full  Diet recommendation: Carb Modified   Brief/Interim Summary: HPI was taken from Dr. Zada Finders: Alexander Beard is a 65 y.o. male with medical history significant for schizoaffective disorder, bipolar type, autism spectrum disorder, COPD, T2DM, HTN, HLD who presents to the ED for evaluation of shortness of breath.  Unable to obtain history from patient due to excessive somnolence and is otherwise obtained from EDP, chart review, and brother by phone.  Per ED documentation, patient was brought to ED via EMS who on their arrival noted patient SPO2 was 92% on room air with coarse crackles in the lungs.  He was placed on 4 L O2 via [place] on route to the ED.  On arrival patient was reportedly alert and interactive however following triage and initial labs he had a change in mental status described as a mumbling speech pattern and minimally responsive.  Spoke with patient's brother by phone.  His brother says that patient recently had a prolonged behavioral health hospital stay from August to December before he was discharged to group care home in [place] which is a new environment for him.  Brother states that previously patient's medication schedule was 4 times daily but since he left the hospital is now taking meds twice daily.  Brother states that he has noted significant "grogginess" over the last few weeks.  This has coincided with several falls that have not resulted in any significant injuries.  ED Course:  Initial vitals showed BP 104/65, pulse 68, RR 18, temp 99.1 F, SPO2 96%  on room air.  Labs showed sodium 140, potassium 4.0, bicarb 28, BUN 13, creatinine 0.68, serum glucose 105, WBC 4.0, hemoglobin 14.1, platelets 105,000, high-sensitivity troponin I 4x2, procalcitonin <0.10, lactic acid 2.3, valproic acid level 92, ammonia 28.  VBG showed pH 7.39, PCO2 62.  Urinalysis shows negative nitrates, negative leukocytes, 0-5 RBCs and WBCs/hpf, rare bacteria microscopy.  UDS positive for tricyclic's and benzodiazepines.  Blood cultures obtained and pending.  SARS-CoV-2 PCR is positive.  2 view chest x-ray shows airspace opacity at the left lung base.  CT head without contrast is negative for acute intracranial abnormality.  Atrophy, chronic microvascular disease noted.  CT cervical spine without contrast shows diffuse degenerative disc and facet disease without acute bony abnormality.  Patient was given 1 L LR and the hospitalist service was consulted to admit for further evaluation and management.  Hospital Course from Dr. Jimmye Norman 2/9-2/18/22 & 2/28-09/09/20: Pt completed remdesivir and steroid course for COVID19 pneumonia. Pt also has been weaned off of oxygen. CM is having trouble with placement issues for the pt initially. CM found a safe d/c for the pt at an ALF. For more information, please see previous progress/ consult notes.    Discharge Diagnoses:  Principal Problem:   Active autistic disorder Active Problems:   Schizoaffective disorder, bipolar type ([place])   Hypertension   Controlled type 2 diabetes mellitus without complication ([place])   Pneumonia due to COVID-19 virus   Encephalopathy acute   Pneumonia  Acute hypoxic respiratory failure: resolved. Secondary to COVID19 pneumonia  Completed remdesivir & steroid  courses.  Acute metabolic encephalopathy: back to baseline. Likely overmedicated, dose has been reduced   Schizoaffective disorder: continue on latuda, depakote, zoloft, seroquel & clonazepam. Likely has autism as per  psych  Hypokalemia:will monitor intermittently   Thrombocytopenia: resolved   DM2: continue on tradjenta, metformin. Likely well controlled   Discharge Instructions  Discharge Instructions    Diet Carb Modified   Complete by: As directed    Discharge instructions   Complete by: As directed    F/u w/ PCP in 1-2 weeks. F/u psychiatry in 1-2 weeks   Increase activity slowly   Complete by: As directed      Allergies as of 09/09/2020      Reactions   Other Other (See Comments)   MYACINS   Penicillins Nausea And Vomiting   Cortizone-10 [hydrocortisone] Rash      Medication List    STOP taking these medications   gabapentin 100 MG capsule Commonly known as: NEURONTIN   metoprolol succinate 50 MG 24 hr tablet Commonly known as: TOPROL-XL   prazosin 1 MG capsule Commonly known as: MINIPRESS     TAKE these medications   aspirin 81 MG EC tablet Take 1 tablet (81 mg total) by mouth daily. Swallow whole.   clonazePAM 1 MG tablet Commonly known as: KLONOPIN Take 1 tablet (1 mg total) by mouth 2 (two) times daily. What changed:   medication strength  when to take this   divalproex 500 MG 24 hr tablet Commonly known as: DEPAKOTE ER Take 2 tablets (1,000 mg total) by mouth at bedtime. What changed: how much to take   hydrOXYzine 10 MG tablet Commonly known as: ATARAX/VISTARIL Take 1 tablet (10 mg total) by mouth 3 (three) times daily as needed for anxiety.   lamoTRIgine 150 MG tablet Commonly known as: LAMICTAL Take 1 tablet (150 mg total) by mouth at bedtime.   linagliptin 5 MG Tabs tablet Commonly known as: TRADJENTA Take 1 tablet (5 mg total) by mouth daily.   loratadine 10 MG tablet Commonly known as: Claritin Take 1 tablet (10 mg total) by mouth daily.   LORazepam 2 MG tablet Commonly known as: ATIVAN Take 1 tablet (2 mg total) by mouth every 6 (six) hours as needed (severe anxiety).   lurasidone 40 MG Tabs tablet Commonly known as: LATUDA Take 1  tablet (40 mg total) by mouth daily with breakfast. Start taking on: September 10, 2020   metFORMIN 850 MG tablet Commonly known as: GLUCOPHAGE Take 1 tablet (850 mg total) by mouth 2 (two) times daily with a meal.   oxybutynin 5 MG tablet Commonly known as: DITROPAN Take 2 tablets (10 mg total) by mouth 2 (two) times daily.   pantoprazole 40 MG tablet Commonly known as: PROTONIX Take 1 tablet (40 mg total) by mouth daily.   QUEtiapine 400 MG tablet Commonly known as: SEROQUEL Take 1 tablet (400 mg total) by mouth at bedtime.   QUEtiapine 100 MG tablet Commonly known as: SEROQUEL Take 1 tablet (100 mg total) by mouth 3 (three) times daily.   sertraline 50 MG tablet Commonly known as: ZOLOFT Take 50 mg by mouth 2 (two) times daily.   simethicone 80 MG chewable tablet Commonly known as: MYLICON Chew 1 tablet (80 mg total) by mouth 3 (three) times daily.   simvastatin 20 MG tablet Commonly known as: ZOCOR Take 20 mg by mouth daily.   tamsulosin 0.4 MG Caps capsule Commonly known as: FLOMAX Take 0.8 mg by mouth daily after supper.  Allergies  Allergen Reactions  . Other Other (See Comments)    MYACINS  . Penicillins Nausea And Vomiting  . Cortizone-10 [Hydrocortisone] Rash    Consultations:  Psych    Procedures/Studies: No results found.   Subjective:   Discharge Exam: Vitals:   09/09/20 0438 09/09/20 0728  BP: 121/82 109/73  Pulse: 85 81  Resp: 18 16  Temp: 98.1 F (36.7 C) 97.9 F (36.6 C)  SpO2: 98% 99%   Vitals:   09/08/20 1947 09/08/20 2342 09/09/20 0438 09/09/20 0728  BP: 102/64 128/85 121/82 109/73  Pulse: 96 78 85 81  Resp: 18 19 18 16   Temp: 98.8 F (37.1 C) 98.1 F (36.7 C) 98.1 F (36.7 C) 97.9 F (36.6 C)  TempSrc: Oral     SpO2: (!) 80% 97% 98% 99%  Weight:      Height:        General: Pt is alert, awake, not in acute distress Cardiovascular: S1/S2 +, no rubs, no gallops Respiratory: CTA bilaterally, no wheezing, no  rhonchi Abdominal: Soft, NT, ND, bowel sounds + Extremities: no cyanosis    The results of significant diagnostics from this hospitalization (including imaging, microbiology, ancillary and laboratory) are listed below for reference.     Microbiology: Recent Results (from the past 240 hour(s))  SARS CORONAVIRUS 2 (TAT 6-24 HRS) Nasopharyngeal Nasopharyngeal Swab     Status: None   Collection Time: 09/07/20 10:15 AM   Specimen: Nasopharyngeal Swab  Result Value Ref Range Status   SARS Coronavirus 2 NEGATIVE NEGATIVE Final    Comment: (NOTE) SARS-CoV-2 target nucleic acids are NOT DETECTED.  The SARS-CoV-2 RNA is generally detectable in upper and lower respiratory specimens during the acute phase of infection. Negative results do not preclude SARS-CoV-2 infection, do not rule out co-infections with other pathogens, and should not be used as the sole basis for treatment or other patient management decisions. Negative results must be combined with clinical observations, patient history, and epidemiological information. The expected result is Negative.  Fact Sheet for Patients: SugarRoll.be  Fact Sheet for Healthcare Providers: https://www.woods-mathews.com/  This test is not yet approved or cleared by the [place] FDA and  has been authorized for detection and/or diagnosis of SARS-CoV-2 by FDA under an Emergency Use Authorization (EUA). This EUA will remain  in effect (meaning this test can be used) for the duration of the COVID-19 declaration under Se ction 564(b)(1) of the Act, 21 U.S.C. section 360bbb-3(b)(1), unless the authorization is terminated or revoked sooner.  Performed at [facility], [place] [address], [place], [place] [address]      Labs: BNP (last 3 results) No results for input(s): BNP in the last 8760 hours. Basic Metabolic Panel: No results for input(s): NA, K, CL, CO2, GLUCOSE, BUN, CREATININE, CALCIUM,  MG, PHOS in the last 168 hours. Liver Function Tests: No results for input(s): AST, ALT, ALKPHOS, BILITOT, PROT, ALBUMIN in the last 168 hours. No results for input(s): LIPASE, AMYLASE in the last 168 hours. No results for input(s): AMMONIA in the last 168 hours. CBC: Recent Labs  Lab 09/03/20 0505  WBC 4.3  HGB 13.5  HCT 40.3  MCV 96.6  PLT 173   Cardiac Enzymes: No results for input(s): CKTOTAL, CKMB, CKMBINDEX, TROPONINI in the last 168 hours. BNP: Invalid input(s): POCBNP CBG: Recent Labs  Lab 09/03/20 2203 09/05/20 2048 09/07/20 0606 09/08/20 0613 09/09/20 0507  GLUCAP 146* 260* 110* 135* 118*   D-Dimer No results for input(s): DDIMER in  the last 72 hours. Hgb A1c No results for input(s): HGBA1C in the last 72 hours. Lipid Profile No results for input(s): CHOL, HDL, LDLCALC, TRIG, CHOLHDL, LDLDIRECT in the last 72 hours. Thyroid function studies No results for input(s): TSH, T4TOTAL, T3FREE, THYROIDAB in the last 72 hours.  Invalid input(s): FREET3 Anemia work up No results for input(s): VITAMINB12, FOLATE, FERRITIN, TIBC, IRON, RETICCTPCT in the last 72 hours. Urinalysis    Component Value Date/Time   COLORURINE AMBER (A) 08/04/2020 1756   APPEARANCEUR CLEAR (A) 08/04/2020 1756   APPEARANCEUR Clear 01/09/2013 1152   LABSPEC 1.027 08/04/2020 1756   LABSPEC 1.021 01/09/2013 1152   PHURINE 5.0 08/04/2020 1756   GLUCOSEU NEGATIVE 08/04/2020 1756   GLUCOSEU Negative 01/09/2013 1152   HGBUR NEGATIVE 08/04/2020 1756   BILIRUBINUR NEGATIVE 08/04/2020 1756   BILIRUBINUR Negative 01/09/2013 1152   KETONESUR 5 (A) 08/04/2020 1756   PROTEINUR 30 (A) 08/04/2020 1756   NITRITE NEGATIVE 08/04/2020 1756   LEUKOCYTESUR NEGATIVE 08/04/2020 1756   LEUKOCYTESUR Negative 01/09/2013 1152   Sepsis Labs Invalid input(s): PROCALCITONIN,  WBC,  LACTICIDVEN Microbiology Recent Results (from the past 240 hour(s))  SARS CORONAVIRUS 2 (TAT 6-24 HRS) Nasopharyngeal  Nasopharyngeal Swab     Status: None   Collection Time: 09/07/20 10:15 AM   Specimen: Nasopharyngeal Swab  Result Value Ref Range Status   SARS Coronavirus 2 NEGATIVE NEGATIVE Final    Comment: (NOTE) SARS-CoV-2 target nucleic acids are NOT DETECTED.  The SARS-CoV-2 RNA is generally detectable in upper and lower respiratory specimens during the acute phase of infection. Negative results do not preclude SARS-CoV-2 infection, do not rule out co-infections with other pathogens, and should not be used as the sole basis for treatment or other patient management decisions. Negative results must be combined with clinical observations, patient history, and epidemiological information. The expected result is Negative.  Fact Sheet for Patients: SugarRoll.be  Fact Sheet for Healthcare Providers: https://www.woods-mathews.com/  This test is not yet approved or cleared by the [place] FDA and  has been authorized for detection and/or diagnosis of SARS-CoV-2 by FDA under an Emergency Use Authorization (EUA). This EUA will remain  in effect (meaning this test can be used) for the duration of the COVID-19 declaration under Se ction 564(b)(1) of the Act, 21 U.S.C. section 360bbb-3(b)(1), unless the authorization is terminated or revoked sooner.  Performed at [facility], [place] [address], [place], [place] [address]      Time coordinating discharge: Over 30 minutes  SIGNED:   Wyvonnia Dusky, MD  [facility] 09/09/2020, 9:39 AM Pager   If 7PM-7AM, please contact night-coverage

## 2020-09-09 NOTE — Plan of Care (Signed)

## 2022-06-13 ENCOUNTER — Ambulatory Visit (INDEPENDENT_AMBULATORY_CARE_PROVIDER_SITE_OTHER): Payer: Medicare Other | Admitting: Podiatry

## 2022-06-13 DIAGNOSIS — M79674 Pain in right toe(s): Secondary | ICD-10-CM

## 2022-06-13 DIAGNOSIS — M79675 Pain in left toe(s): Secondary | ICD-10-CM | POA: Diagnosis not present

## 2022-06-13 DIAGNOSIS — E1142 Type 2 diabetes mellitus with diabetic polyneuropathy: Secondary | ICD-10-CM | POA: Diagnosis not present

## 2022-06-13 DIAGNOSIS — B351 Tinea unguium: Secondary | ICD-10-CM

## 2022-06-13 DIAGNOSIS — E119 Type 2 diabetes mellitus without complications: Secondary | ICD-10-CM

## 2022-06-13 NOTE — Progress Notes (Signed)
  Subjective:  Patient ID: Alexander Beard, male    DOB: February 03, 1956,  MRN: [id number]  Chief Complaint  Patient presents with   Nail Problem    [facility]    66 y.o. male presents with the above complaint. History confirmed with patient. Patient presenting with pain related to dystrophic thickened elongated nails. Patient is unable to trim own nails related to nail dystrophy and/or mobility issues. Patient does  have a history of T2DM.   Objective:  Physical Exam: warm, good capillary refill nail exam onychomycosis of the toenails, onycholysis, and dystrophic nails DP pulses palpable, PT pulses palpable, and protective sensation intact Left Foot:  Pain with palpation of nails due to elongation and dystrophic growth.  Right Foot: Pain with palpation of nails due to elongation and dystrophic growth.   Assessment:   1. Pain due to onychomycosis of toenails of both feet   2. Encounter for diabetic foot exam ([place])   3. DM type 2 with diabetic peripheral neuropathy ([place])      Plan:  Patient was evaluated and treated and all questions answered.  Patient educated on diabetes. Discussed proper diabetic foot care and discussed risks and complications of disease. Educated patient in depth on reasons to return to the office immediately should he/she discover anything concerning or new on the feet. All questions answered. Discussed proper shoes as well.   #Onychomycosis with pain  -Nails palliatively debrided as below. -Educated on self-care  Procedure: Nail Debridement Rationale: Pain Type of Debridement: manual, sharp debridement. Instrumentation: Nail nipper, rotary burr. Number of Nails: 10  Return in about 3 months (around 09/12/2022) for [facility].         Everitt Amber, DPM [facility]

## 2022-11-23 IMAGING — CT CT HEAD W/O CM
3 series · 16 of 46 positions shown, 19 images · non-contrast
Comparison: 07/18/2020

CLINICAL DATA: Fall, bruising to right forehead

EXAM:
CT HEAD WITHOUT CONTRAST
TECHNIQUE: Contiguous axial images were obtained from the base of the skull
through the vertex without intravenous contrast.

[Series 2: head wo · axial · 0.39mm/px · z∈[+286,+406]mm · 10 of 29 slices shown, 13 images]
[im 3/29  brain]
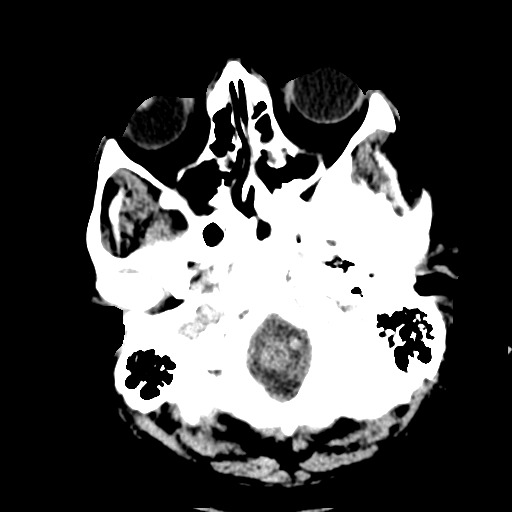
[im 3/29  bone]
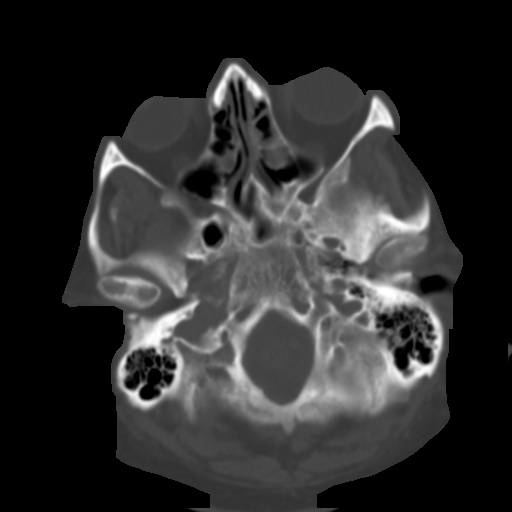
[im 6/29  brain]
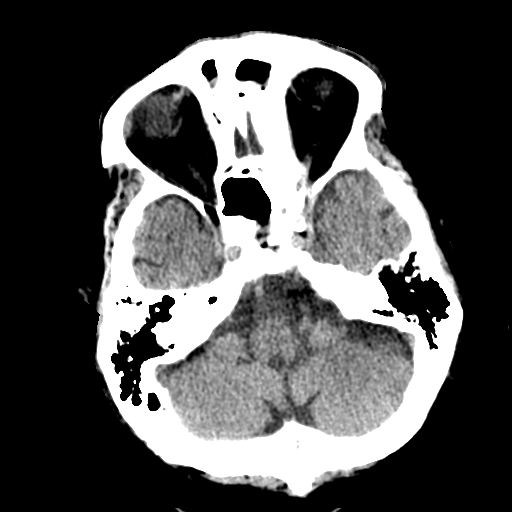
[im 8/29  brain]
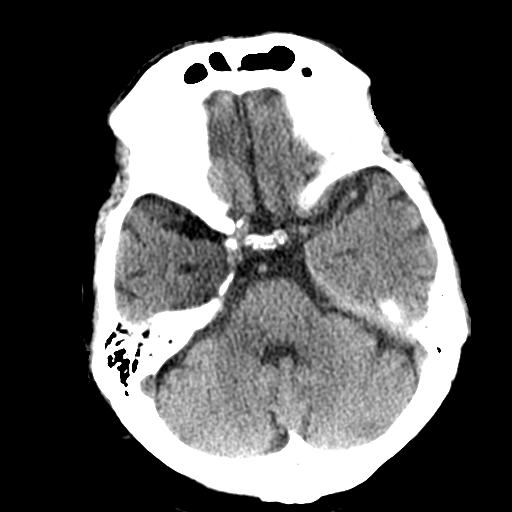
[im 11/29  brain]
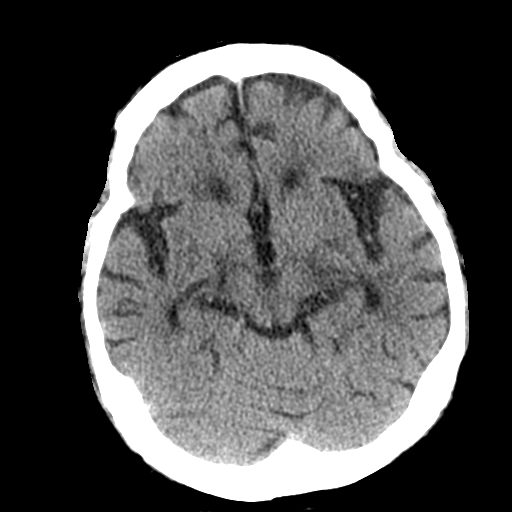
[im 14/29  brain]
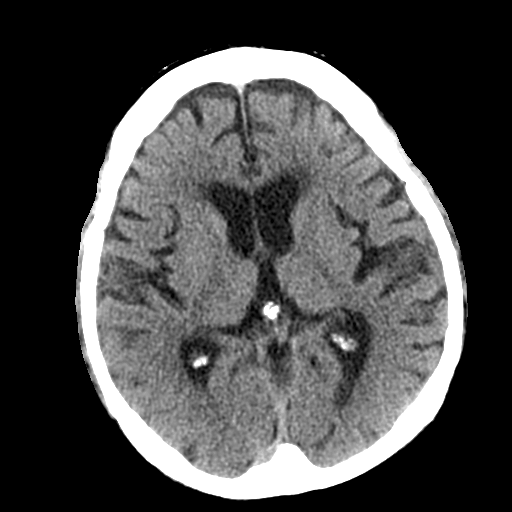
[im 14/29  bone]
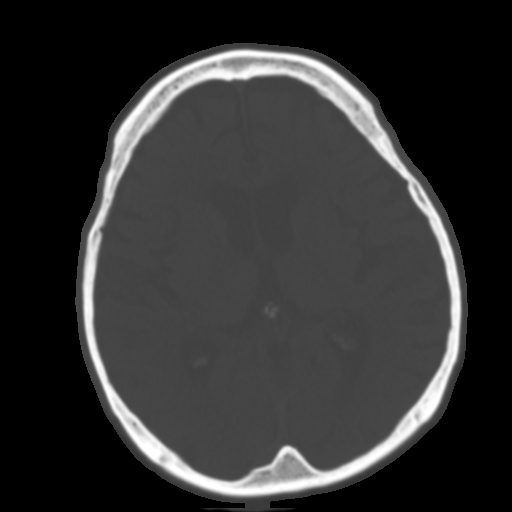
[im 16/29  brain]
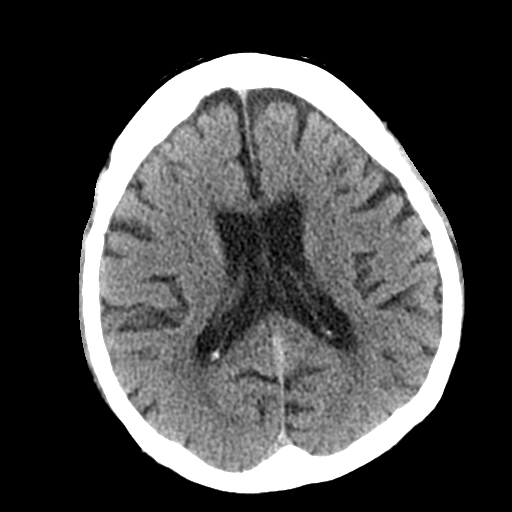
[im 19/29  brain]
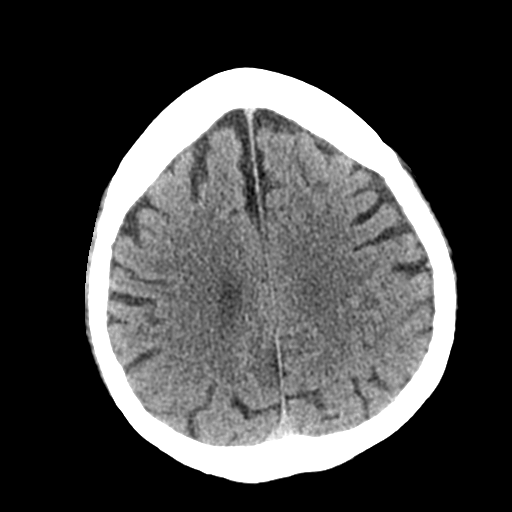
[im 22/29  brain]
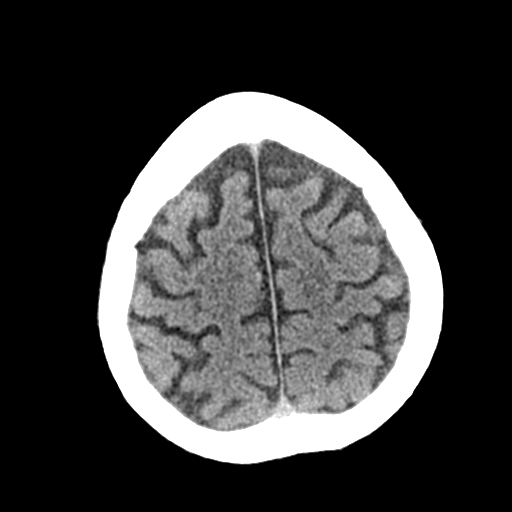
[im 24/29  brain]
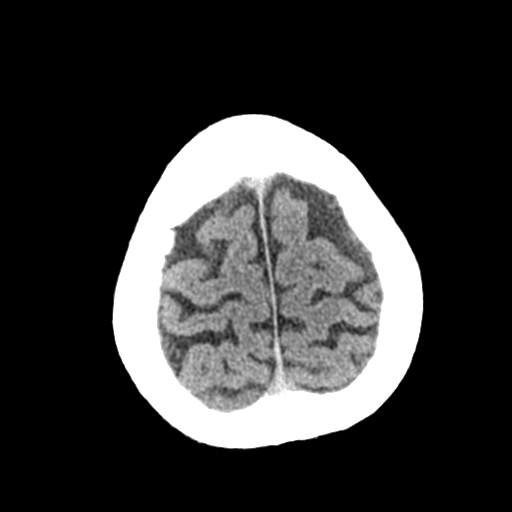
[im 24/29  bone]
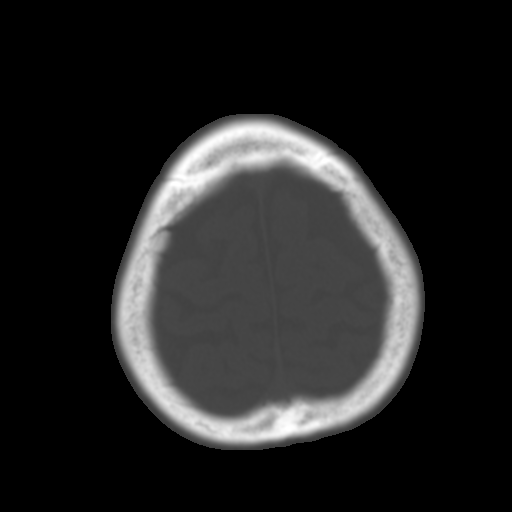
[im 27/29  brain]
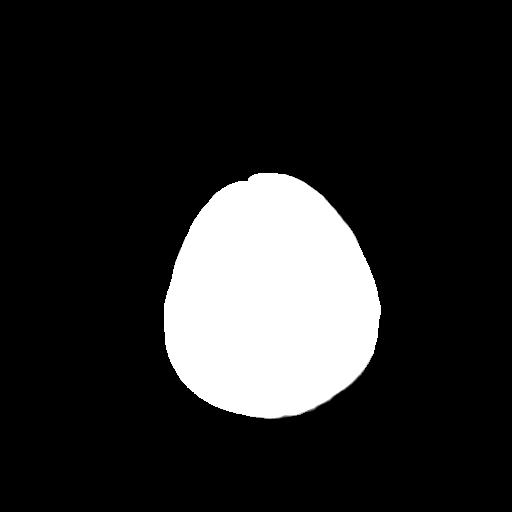

[Series 4: coronal soft tissue · coronal · 0.30mm/px · 3 of 64 slices shown]
[im 22/64  brain]
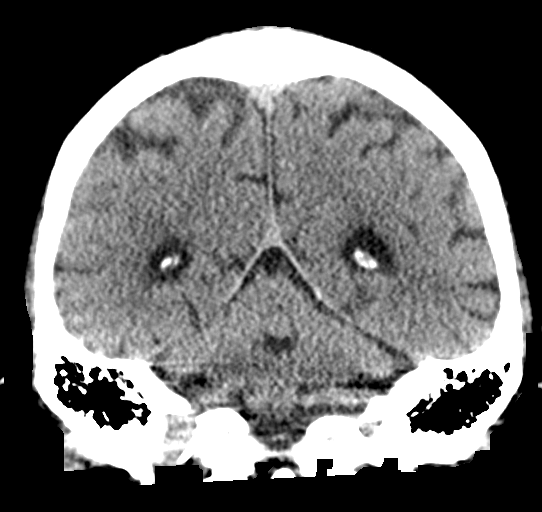
[im 29/64  brain]
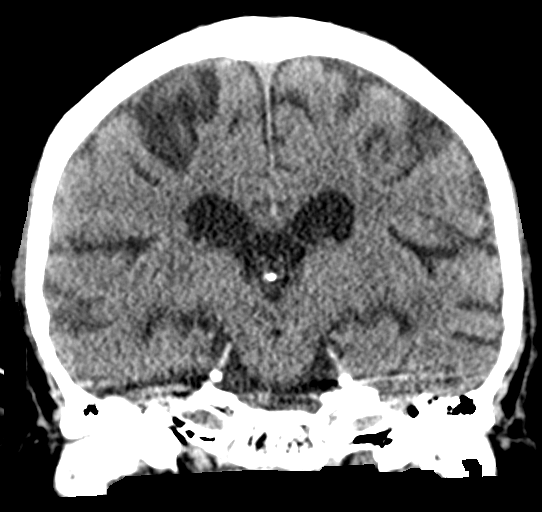
[im 36/64  brain]
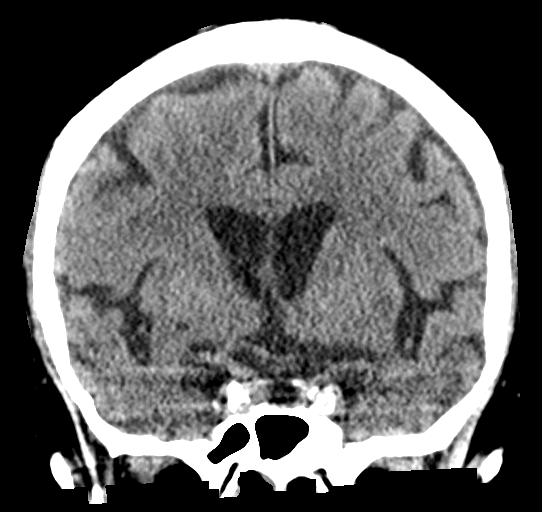

[Series 5: sagittal soft tissue · sagittal · 0.31mm/px · 3 of 62 slices shown]
[im 21/62  brain]
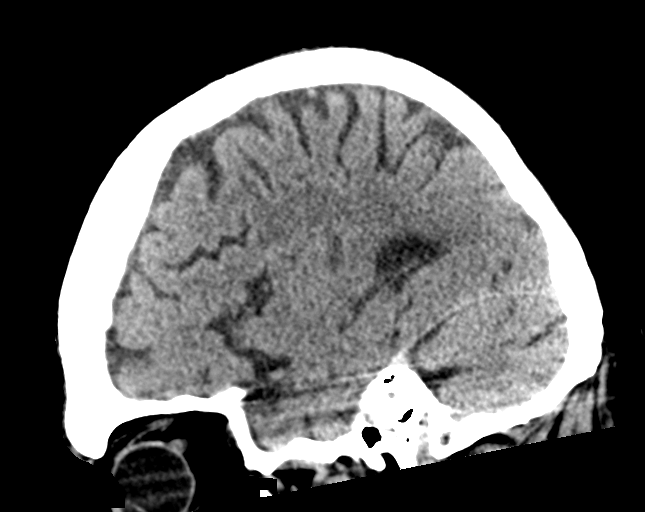
[im 31/62  brain]
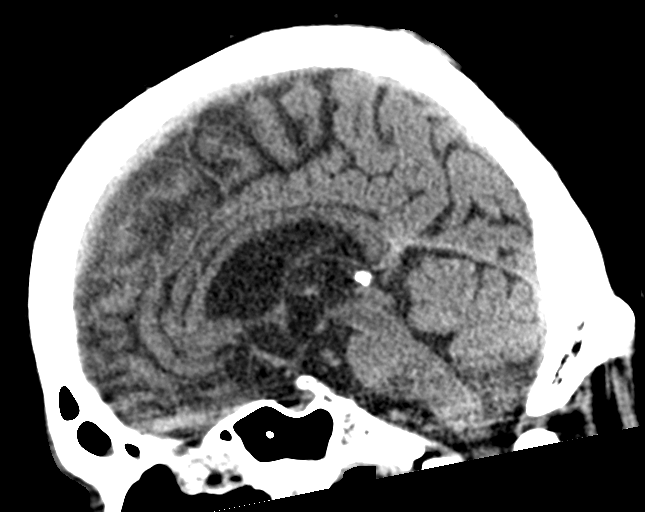
[im 41/62  brain]
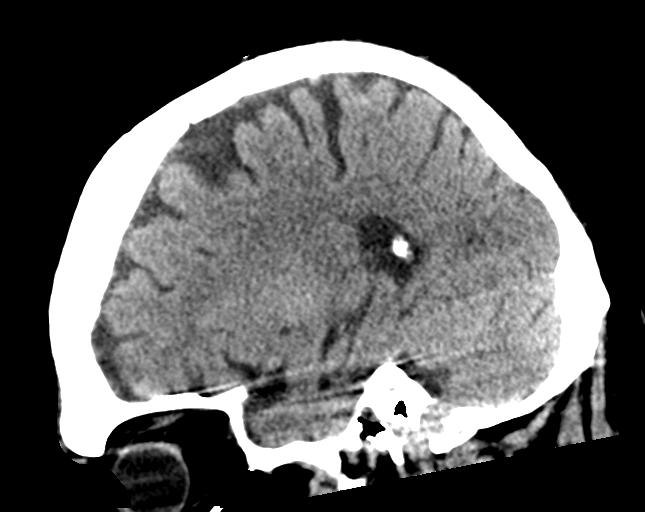

[16 of 46 positions shown; findings below may reference images not displayed]

FINDINGS: Brain: There is atrophy and chronic small vessel disease changes. No
acute intracranial abnormality. Specifically, no hemorrhage,
hydrocephalus, mass lesion, acute infarction, or significant
intracranial injury.

Vascular: No hyperdense vessel or unexpected calcification.

Skull: No acute calvarial abnormality.

Sinuses/Orbits: Mucosal thickening throughout the paranasal sinuses.
No air-fluid levels.

Other: None
IMPRESSION: Atrophy, chronic microvascular disease.

No acute intracranial abnormality.

Chronic sinusitis

## 2022-11-23 IMAGING — CT CT CERVICAL SPINE W/O CM
3 of 4 series · 11 of 35 positions shown, 13 images · non-contrast
Comparison: None.

CLINICAL DATA: Fall

EXAM:
CT CERVICAL SPINE WITHOUT CONTRAST
TECHNIQUE: Multidetector CT imaging of the cervical spine was performed without
intravenous contrast. Multiplanar CT image reconstructions were also
generated.

[Series 6: sagittal bone · sagittal · 0.21mm/px · 5 of 58 slices shown, 6 images]
[im 20/58  bone]
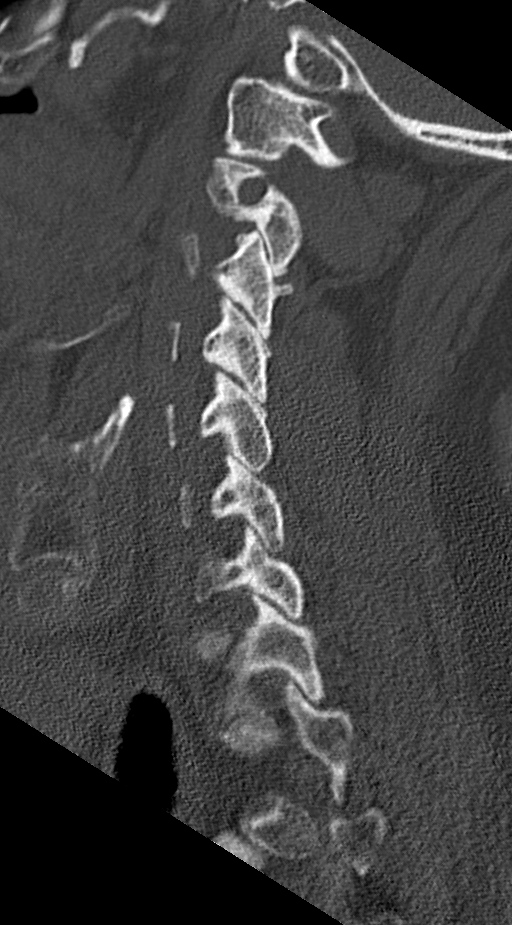
[im 24/58  bone]
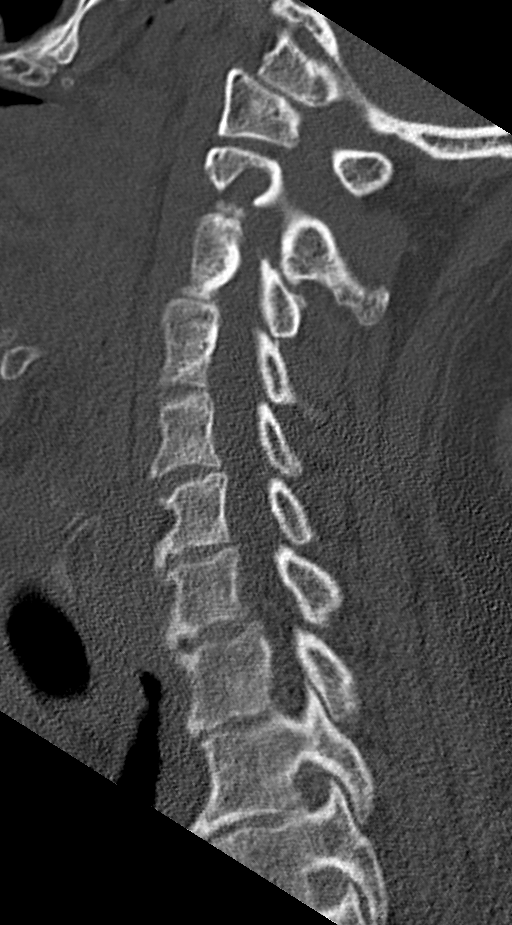
[im 29/58  soft-tissue]
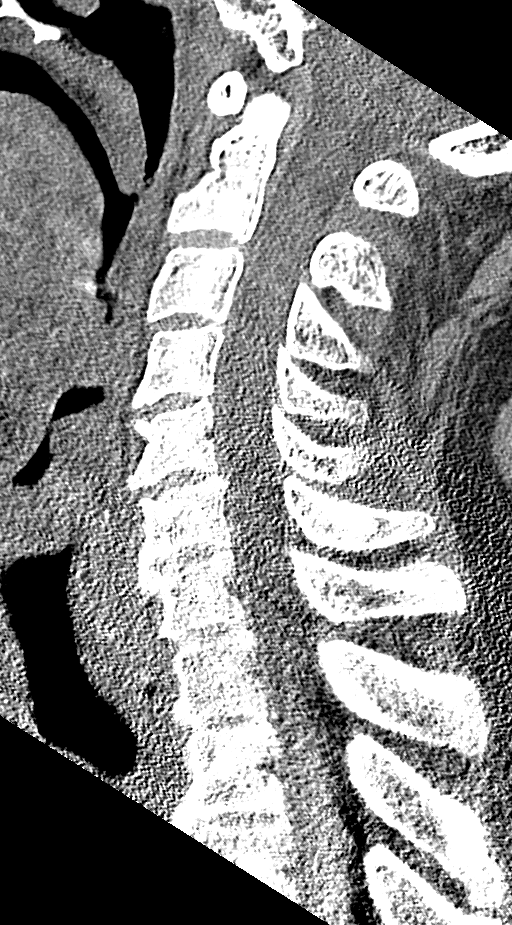
[im 29/58  bone]
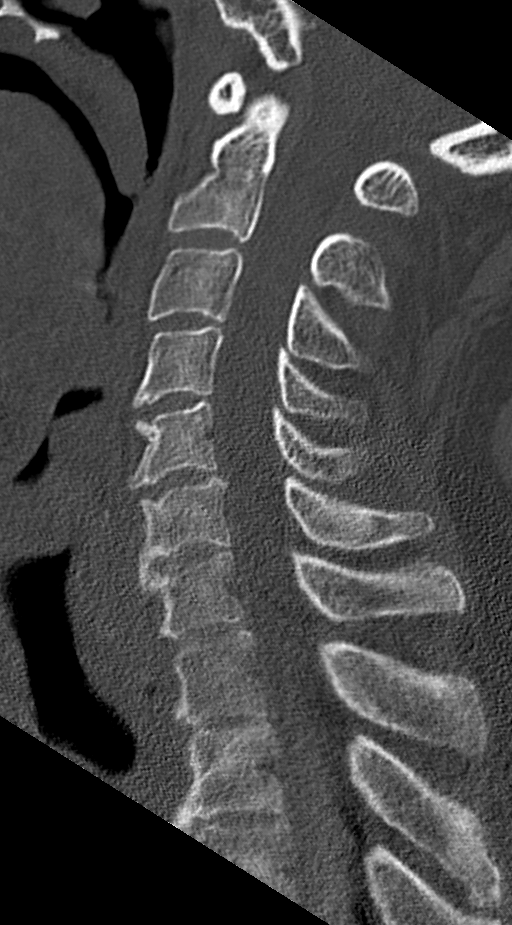
[im 34/58  bone]
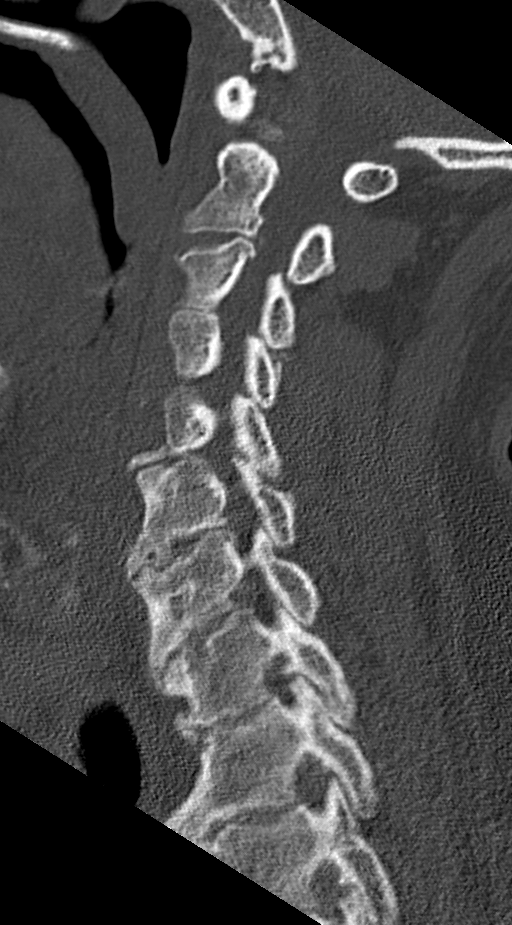
[im 39/58  bone]
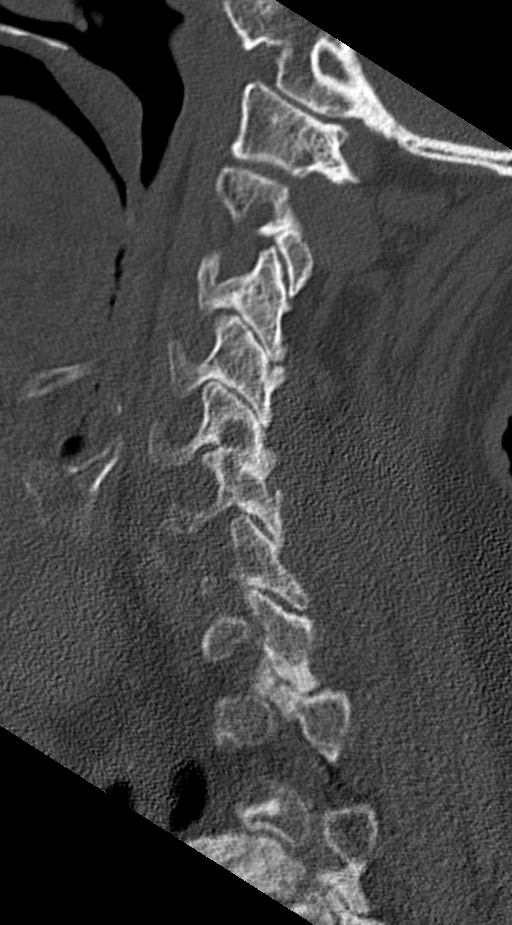

[Series 7: coronal bone · coronal · 0.22mm/px · 3 of 55 slices shown]
[im 14/55  bone]
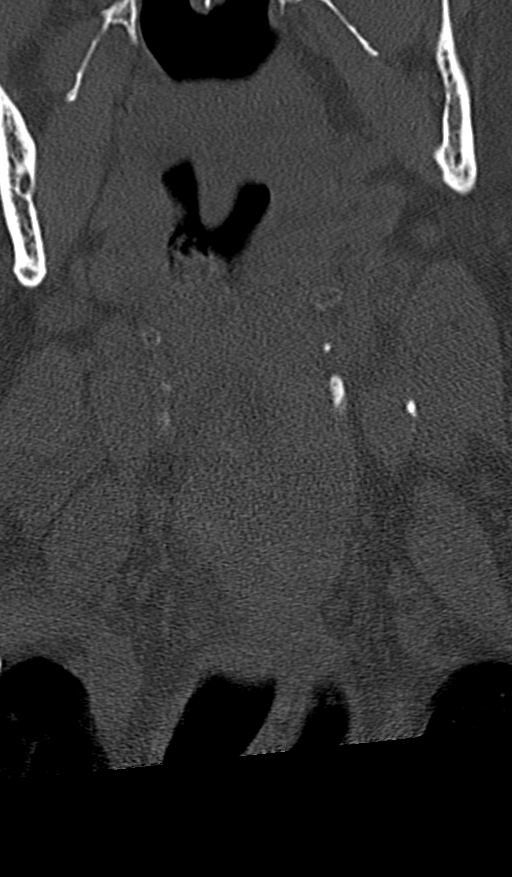
[im 23/55  bone]
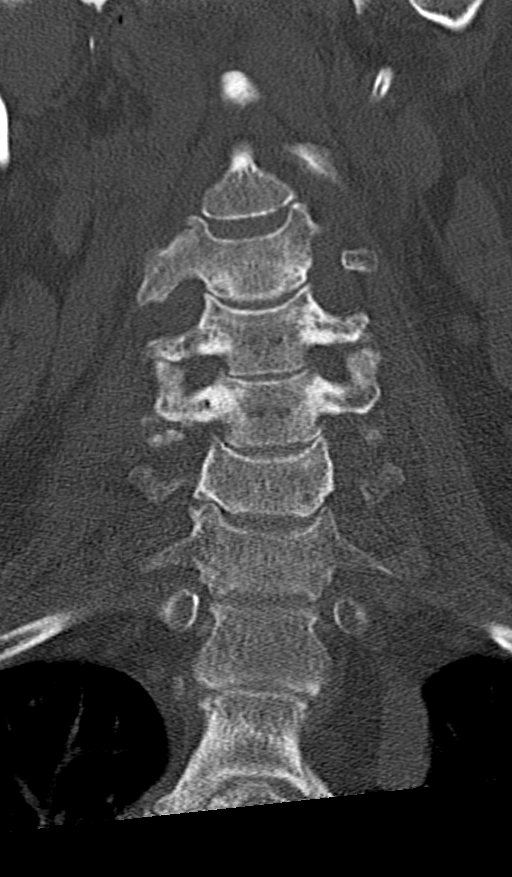
[im 32/55  bone]
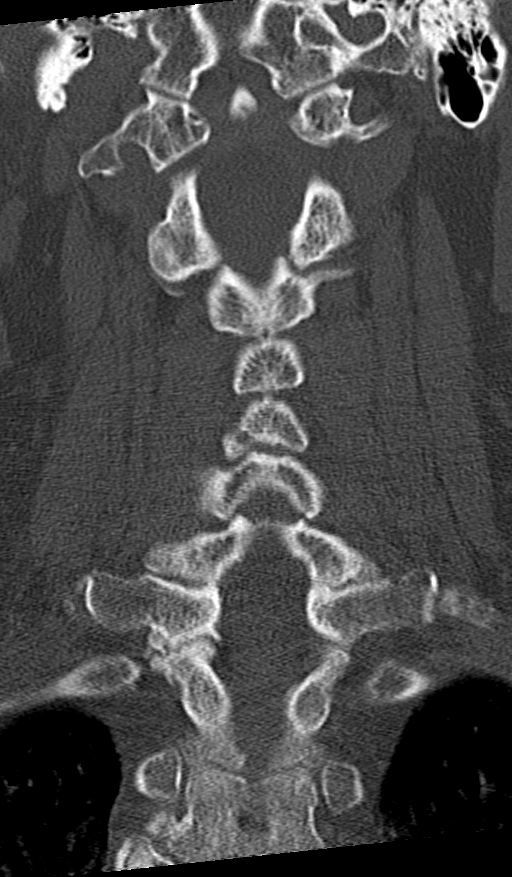

[Series 8: orthogonal bone · axial · 0.21mm/px · z∈[+133,+227]mm · 3 of 99 slices shown, 4 images]
[im 29/99  soft-tissue]
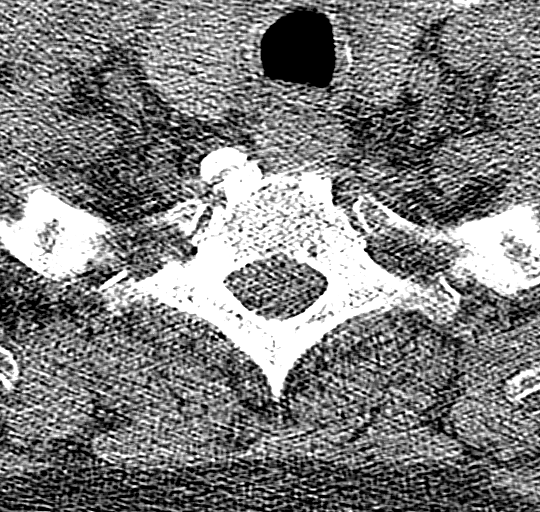
[im 29/99  bone]
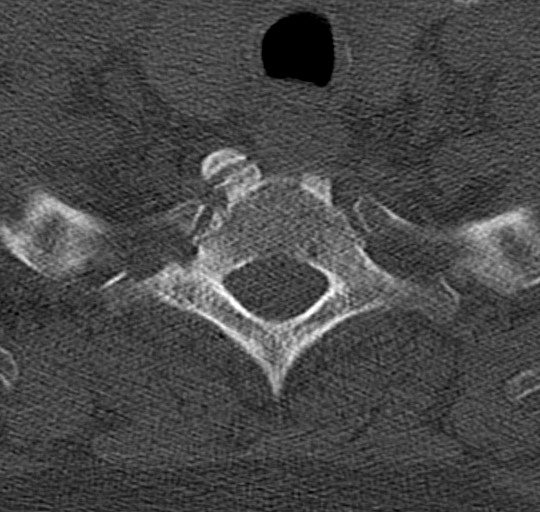
[im 57/99  bone]
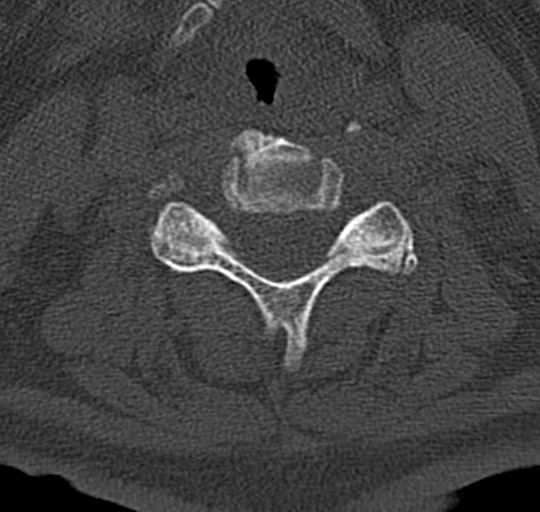
[im 85/99  bone]
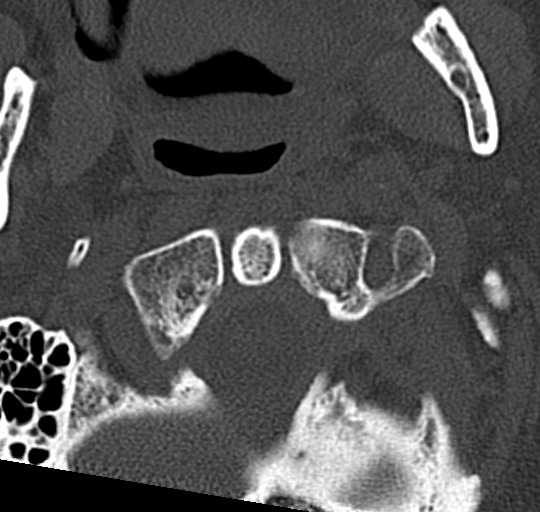

[11 of 35 positions shown; findings below may reference images not displayed]

FINDINGS: Alignment: No subluxation.

Skull base and vertebrae: No acute fracture. No primary bone lesion
or focal pathologic process.

Soft tissues and spinal canal: No prevertebral fluid or swelling. No
visible canal hematoma.

Disc levels: Diffuse degenerative disc disease with disc space
narrowing and spurring. Diffuse degenerative facet disease.

Upper chest: No acute findings

Other: None
IMPRESSION: Diffuse degenerative disc and facet disease. No acute bony
abnormality.
